# Patient Record
Sex: Female | Born: 1940 | Race: White | Hispanic: No | Smoking: Former smoker
Health system: Southern US, Community
[De-identification: ages and names within clinical notes are randomized; demographics above are authoritative.]

## PROBLEM LIST (undated history)

## (undated) DIAGNOSIS — F32A Depression, unspecified: Secondary | ICD-10-CM

## (undated) DIAGNOSIS — R112 Nausea with vomiting, unspecified: Secondary | ICD-10-CM

## (undated) DIAGNOSIS — I10 Essential (primary) hypertension: Secondary | ICD-10-CM

## (undated) DIAGNOSIS — R51 Headache: Secondary | ICD-10-CM

## (undated) DIAGNOSIS — N183 Chronic kidney disease, stage 3 unspecified: Secondary | ICD-10-CM

## (undated) DIAGNOSIS — Z9889 Other specified postprocedural states: Secondary | ICD-10-CM

## (undated) DIAGNOSIS — F329 Major depressive disorder, single episode, unspecified: Secondary | ICD-10-CM

## (undated) DIAGNOSIS — R519 Headache, unspecified: Secondary | ICD-10-CM

## (undated) DIAGNOSIS — M1990 Unspecified osteoarthritis, unspecified site: Secondary | ICD-10-CM

## (undated) DIAGNOSIS — K219 Gastro-esophageal reflux disease without esophagitis: Secondary | ICD-10-CM

## (undated) DIAGNOSIS — Z923 Personal history of irradiation: Secondary | ICD-10-CM

## (undated) DIAGNOSIS — N950 Postmenopausal bleeding: Secondary | ICD-10-CM

## (undated) DIAGNOSIS — Z789 Other specified health status: Secondary | ICD-10-CM

## (undated) DIAGNOSIS — G709 Myoneural disorder, unspecified: Secondary | ICD-10-CM

## (undated) DIAGNOSIS — Z973 Presence of spectacles and contact lenses: Secondary | ICD-10-CM

## (undated) DIAGNOSIS — R350 Frequency of micturition: Secondary | ICD-10-CM

## (undated) DIAGNOSIS — E039 Hypothyroidism, unspecified: Secondary | ICD-10-CM

## (undated) DIAGNOSIS — E119 Type 2 diabetes mellitus without complications: Secondary | ICD-10-CM

## (undated) DIAGNOSIS — C541 Malignant neoplasm of endometrium: Secondary | ICD-10-CM

## (undated) HISTORY — DX: Personal history of irradiation: Z92.3

## (undated) HISTORY — DX: Malignant neoplasm of endometrium: C54.1

## (undated) HISTORY — PX: OTHER SURGICAL HISTORY: SHX169

## (undated) HISTORY — PX: BREAST SURGERY: SHX581

## (undated) HISTORY — PX: JOINT REPLACEMENT: SHX530

## (undated) HISTORY — PX: TONSILLECTOMY: SUR1361

## (undated) HISTORY — PX: IRRIGATION AND DEBRIDEMENT SEBACEOUS CYST: SHX5255

## (undated) HISTORY — DX: Postmenopausal bleeding: N95.0

## (undated) HISTORY — PX: FRACTURE SURGERY: SHX138

---

## 1997-08-30 ENCOUNTER — Inpatient Hospital Stay (HOSPITAL_COMMUNITY): Admission: RE | Admit: 1997-08-30 | Discharge: 1997-09-04 | Payer: Self-pay | Admitting: Orthopaedic Surgery

## 1997-09-08 ENCOUNTER — Other Ambulatory Visit: Admission: RE | Admit: 1997-09-08 | Discharge: 1997-09-08 | Payer: Self-pay | Admitting: Orthopaedic Surgery

## 2003-07-26 ENCOUNTER — Encounter: Admission: RE | Admit: 2003-07-26 | Discharge: 2003-07-26 | Payer: Self-pay | Admitting: Internal Medicine

## 2013-05-11 DIAGNOSIS — R809 Proteinuria, unspecified: Secondary | ICD-10-CM | POA: Diagnosis not present

## 2013-05-11 DIAGNOSIS — E785 Hyperlipidemia, unspecified: Secondary | ICD-10-CM | POA: Diagnosis not present

## 2013-05-11 DIAGNOSIS — I10 Essential (primary) hypertension: Secondary | ICD-10-CM | POA: Diagnosis not present

## 2013-05-11 DIAGNOSIS — E119 Type 2 diabetes mellitus without complications: Secondary | ICD-10-CM | POA: Diagnosis not present

## 2013-05-11 DIAGNOSIS — R82998 Other abnormal findings in urine: Secondary | ICD-10-CM | POA: Diagnosis not present

## 2013-07-09 DIAGNOSIS — F341 Dysthymic disorder: Secondary | ICD-10-CM | POA: Diagnosis not present

## 2013-07-09 DIAGNOSIS — E079 Disorder of thyroid, unspecified: Secondary | ICD-10-CM | POA: Diagnosis not present

## 2013-07-09 DIAGNOSIS — I10 Essential (primary) hypertension: Secondary | ICD-10-CM | POA: Diagnosis not present

## 2013-07-09 DIAGNOSIS — M545 Low back pain, unspecified: Secondary | ICD-10-CM | POA: Diagnosis not present

## 2013-07-09 DIAGNOSIS — E119 Type 2 diabetes mellitus without complications: Secondary | ICD-10-CM | POA: Diagnosis not present

## 2013-07-09 DIAGNOSIS — IMO0002 Reserved for concepts with insufficient information to code with codable children: Secondary | ICD-10-CM | POA: Diagnosis not present

## 2013-07-09 DIAGNOSIS — N183 Chronic kidney disease, stage 3 unspecified: Secondary | ICD-10-CM | POA: Diagnosis not present

## 2013-07-09 DIAGNOSIS — Z0000 Encounter for general adult medical examination without abnormal findings: Secondary | ICD-10-CM | POA: Diagnosis not present

## 2013-07-09 DIAGNOSIS — Z1331 Encounter for screening for depression: Secondary | ICD-10-CM | POA: Diagnosis not present

## 2013-08-03 DIAGNOSIS — Z6829 Body mass index (BMI) 29.0-29.9, adult: Secondary | ICD-10-CM | POA: Diagnosis not present

## 2013-08-03 DIAGNOSIS — IMO0002 Reserved for concepts with insufficient information to code with codable children: Secondary | ICD-10-CM | POA: Diagnosis not present

## 2013-08-05 ENCOUNTER — Other Ambulatory Visit: Payer: Self-pay | Admitting: Neurosurgery

## 2013-08-05 DIAGNOSIS — M5416 Radiculopathy, lumbar region: Secondary | ICD-10-CM

## 2013-08-17 ENCOUNTER — Ambulatory Visit
Admission: RE | Admit: 2013-08-17 | Discharge: 2013-08-17 | Disposition: A | Discharge status: Home / Self Care | Payer: Medicare Other | Source: Ambulatory Visit | Attending: Neurosurgery | Admitting: Neurosurgery

## 2013-08-17 VITALS — BP 197/86 | HR 79

## 2013-08-17 DIAGNOSIS — M47817 Spondylosis without myelopathy or radiculopathy, lumbosacral region: Secondary | ICD-10-CM | POA: Diagnosis not present

## 2013-08-17 DIAGNOSIS — M4310 Spondylolisthesis, site unspecified: Secondary | ICD-10-CM | POA: Diagnosis not present

## 2013-08-17 DIAGNOSIS — IMO0002 Reserved for concepts with insufficient information to code with codable children: Secondary | ICD-10-CM | POA: Diagnosis not present

## 2013-08-17 DIAGNOSIS — M5416 Radiculopathy, lumbar region: Secondary | ICD-10-CM

## 2013-08-17 DIAGNOSIS — M48061 Spinal stenosis, lumbar region without neurogenic claudication: Secondary | ICD-10-CM | POA: Diagnosis not present

## 2013-08-17 DIAGNOSIS — M5137 Other intervertebral disc degeneration, lumbosacral region: Secondary | ICD-10-CM | POA: Diagnosis not present

## 2013-08-17 MED ORDER — DIAZEPAM 5 MG PO TABS
5.0000 mg | ORAL_TABLET | Freq: Once | ORAL | Status: AC
  Administered 2013-08-17: 5 mg via ORAL

## 2013-08-17 MED ORDER — IOHEXOL 180 MG/ML  SOLN
15.0000 mL | Freq: Once | INTRAMUSCULAR | Status: AC | PRN
  Administered 2013-08-17: 15 mL via INTRATHECAL

## 2013-08-17 NOTE — Discharge Instructions (Signed)

## 2013-08-17 NOTE — Progress Notes (Signed)
Daughter in law called discharge time.

## 2013-09-08 DIAGNOSIS — M48061 Spinal stenosis, lumbar region without neurogenic claudication: Secondary | ICD-10-CM | POA: Diagnosis not present

## 2013-09-08 DIAGNOSIS — IMO0002 Reserved for concepts with insufficient information to code with codable children: Secondary | ICD-10-CM | POA: Diagnosis not present

## 2013-09-08 DIAGNOSIS — Z6830 Body mass index (BMI) 30.0-30.9, adult: Secondary | ICD-10-CM | POA: Diagnosis not present

## 2013-09-08 DIAGNOSIS — M549 Dorsalgia, unspecified: Secondary | ICD-10-CM | POA: Diagnosis not present

## 2013-11-11 DIAGNOSIS — M48061 Spinal stenosis, lumbar region without neurogenic claudication: Secondary | ICD-10-CM | POA: Diagnosis not present

## 2013-11-11 DIAGNOSIS — IMO0002 Reserved for concepts with insufficient information to code with codable children: Secondary | ICD-10-CM | POA: Diagnosis not present

## 2013-11-11 DIAGNOSIS — M549 Dorsalgia, unspecified: Secondary | ICD-10-CM | POA: Diagnosis not present

## 2014-01-17 DIAGNOSIS — N183 Chronic kidney disease, stage 3 unspecified: Secondary | ICD-10-CM | POA: Diagnosis not present

## 2014-01-17 DIAGNOSIS — E079 Disorder of thyroid, unspecified: Secondary | ICD-10-CM | POA: Diagnosis not present

## 2014-01-17 DIAGNOSIS — G8929 Other chronic pain: Secondary | ICD-10-CM | POA: Diagnosis not present

## 2014-01-17 DIAGNOSIS — E1129 Type 2 diabetes mellitus with other diabetic kidney complication: Secondary | ICD-10-CM | POA: Diagnosis not present

## 2014-01-17 DIAGNOSIS — Z23 Encounter for immunization: Secondary | ICD-10-CM | POA: Diagnosis not present

## 2014-01-17 DIAGNOSIS — IMO0002 Reserved for concepts with insufficient information to code with codable children: Secondary | ICD-10-CM | POA: Diagnosis not present

## 2014-01-17 DIAGNOSIS — E785 Hyperlipidemia, unspecified: Secondary | ICD-10-CM | POA: Diagnosis not present

## 2014-01-17 DIAGNOSIS — F341 Dysthymic disorder: Secondary | ICD-10-CM | POA: Diagnosis not present

## 2014-01-17 DIAGNOSIS — H6120 Impacted cerumen, unspecified ear: Secondary | ICD-10-CM | POA: Diagnosis not present

## 2014-02-04 DIAGNOSIS — H902 Conductive hearing loss, unspecified: Secondary | ICD-10-CM | POA: Diagnosis not present

## 2014-02-04 DIAGNOSIS — H6121 Impacted cerumen, right ear: Secondary | ICD-10-CM | POA: Diagnosis not present

## 2014-02-21 DIAGNOSIS — H6122 Impacted cerumen, left ear: Secondary | ICD-10-CM | POA: Diagnosis not present

## 2014-07-18 DIAGNOSIS — E785 Hyperlipidemia, unspecified: Secondary | ICD-10-CM | POA: Diagnosis not present

## 2014-07-18 DIAGNOSIS — I10 Essential (primary) hypertension: Secondary | ICD-10-CM | POA: Diagnosis not present

## 2014-07-18 DIAGNOSIS — Z008 Encounter for other general examination: Secondary | ICD-10-CM | POA: Diagnosis not present

## 2014-07-18 DIAGNOSIS — R8299 Other abnormal findings in urine: Secondary | ICD-10-CM | POA: Diagnosis not present

## 2014-07-18 DIAGNOSIS — E119 Type 2 diabetes mellitus without complications: Secondary | ICD-10-CM | POA: Diagnosis not present

## 2014-07-22 DIAGNOSIS — R946 Abnormal results of thyroid function studies: Secondary | ICD-10-CM | POA: Diagnosis not present

## 2014-07-25 DIAGNOSIS — Z1389 Encounter for screening for other disorder: Secondary | ICD-10-CM | POA: Diagnosis not present

## 2014-07-25 DIAGNOSIS — N183 Chronic kidney disease, stage 3 (moderate): Secondary | ICD-10-CM | POA: Diagnosis not present

## 2014-07-25 DIAGNOSIS — E119 Type 2 diabetes mellitus without complications: Secondary | ICD-10-CM | POA: Diagnosis not present

## 2014-07-25 DIAGNOSIS — Z6834 Body mass index (BMI) 34.0-34.9, adult: Secondary | ICD-10-CM | POA: Diagnosis not present

## 2014-07-25 DIAGNOSIS — R946 Abnormal results of thyroid function studies: Secondary | ICD-10-CM | POA: Diagnosis not present

## 2014-07-25 DIAGNOSIS — E669 Obesity, unspecified: Secondary | ICD-10-CM | POA: Diagnosis not present

## 2014-07-25 DIAGNOSIS — F418 Other specified anxiety disorders: Secondary | ICD-10-CM | POA: Diagnosis not present

## 2014-07-25 DIAGNOSIS — G894 Chronic pain syndrome: Secondary | ICD-10-CM | POA: Diagnosis not present

## 2014-07-25 DIAGNOSIS — Z23 Encounter for immunization: Secondary | ICD-10-CM | POA: Diagnosis not present

## 2014-07-25 DIAGNOSIS — M545 Low back pain: Secondary | ICD-10-CM | POA: Diagnosis not present

## 2014-07-25 DIAGNOSIS — J449 Chronic obstructive pulmonary disease, unspecified: Secondary | ICD-10-CM | POA: Diagnosis not present

## 2014-07-25 DIAGNOSIS — Z0000 Encounter for general adult medical examination without abnormal findings: Secondary | ICD-10-CM | POA: Diagnosis not present

## 2014-09-26 DIAGNOSIS — R739 Hyperglycemia, unspecified: Secondary | ICD-10-CM | POA: Diagnosis not present

## 2014-09-26 DIAGNOSIS — Z96641 Presence of right artificial hip joint: Secondary | ICD-10-CM | POA: Diagnosis not present

## 2014-09-26 DIAGNOSIS — M25551 Pain in right hip: Secondary | ICD-10-CM | POA: Diagnosis not present

## 2014-10-06 DIAGNOSIS — M1611 Unilateral primary osteoarthritis, right hip: Secondary | ICD-10-CM | POA: Diagnosis not present

## 2014-10-06 DIAGNOSIS — M25551 Pain in right hip: Secondary | ICD-10-CM | POA: Diagnosis not present

## 2014-11-01 DIAGNOSIS — M1611 Unilateral primary osteoarthritis, right hip: Secondary | ICD-10-CM | POA: Diagnosis not present

## 2014-11-03 ENCOUNTER — Other Ambulatory Visit (HOSPITAL_COMMUNITY): Payer: Self-pay | Admitting: Orthopaedic Surgery

## 2014-11-03 DIAGNOSIS — M25561 Pain in right knee: Secondary | ICD-10-CM

## 2014-11-10 ENCOUNTER — Ambulatory Visit (HOSPITAL_COMMUNITY)
Admission: RE | Admit: 2014-11-10 | Discharge: 2014-11-10 | Disposition: A | Discharge status: Home / Self Care | Payer: Medicare Other | Source: Ambulatory Visit | Attending: Orthopaedic Surgery | Admitting: Orthopaedic Surgery

## 2014-11-10 ENCOUNTER — Encounter (HOSPITAL_COMMUNITY)
Admission: RE | Admit: 2014-11-10 | Discharge: 2014-11-10 | Disposition: A | Discharge status: Home / Self Care | Payer: Medicare Other | Source: Ambulatory Visit | Attending: Orthopaedic Surgery | Admitting: Orthopaedic Surgery

## 2014-11-10 DIAGNOSIS — Z96651 Presence of right artificial knee joint: Secondary | ICD-10-CM | POA: Diagnosis not present

## 2014-11-10 DIAGNOSIS — M25561 Pain in right knee: Secondary | ICD-10-CM

## 2014-11-10 MED ORDER — TECHNETIUM TC 99M MEDRONATE IV KIT
25.0000 | PACK | Freq: Once | INTRAVENOUS | Status: AC | PRN
  Administered 2014-11-10: 25 via INTRAVENOUS

## 2014-11-22 DIAGNOSIS — M1611 Unilateral primary osteoarthritis, right hip: Secondary | ICD-10-CM | POA: Diagnosis not present

## 2014-11-22 DIAGNOSIS — E119 Type 2 diabetes mellitus without complications: Secondary | ICD-10-CM | POA: Diagnosis not present

## 2014-11-22 DIAGNOSIS — M25561 Pain in right knee: Secondary | ICD-10-CM | POA: Diagnosis not present

## 2014-12-13 DIAGNOSIS — E119 Type 2 diabetes mellitus without complications: Secondary | ICD-10-CM | POA: Diagnosis not present

## 2014-12-13 DIAGNOSIS — Z6834 Body mass index (BMI) 34.0-34.9, adult: Secondary | ICD-10-CM | POA: Diagnosis not present

## 2014-12-13 DIAGNOSIS — M25551 Pain in right hip: Secondary | ICD-10-CM | POA: Diagnosis not present

## 2015-01-23 DIAGNOSIS — M1611 Unilateral primary osteoarthritis, right hip: Secondary | ICD-10-CM | POA: Diagnosis not present

## 2015-01-23 DIAGNOSIS — M25551 Pain in right hip: Secondary | ICD-10-CM | POA: Diagnosis not present

## 2015-01-23 DIAGNOSIS — M25561 Pain in right knee: Secondary | ICD-10-CM | POA: Diagnosis not present

## 2015-01-23 DIAGNOSIS — E119 Type 2 diabetes mellitus without complications: Secondary | ICD-10-CM | POA: Diagnosis not present

## 2015-01-23 LAB — HEMOGLOBIN A1C: Hemoglobin A1C: 7.6

## 2015-01-30 DIAGNOSIS — J449 Chronic obstructive pulmonary disease, unspecified: Secondary | ICD-10-CM | POA: Diagnosis not present

## 2015-01-30 DIAGNOSIS — E669 Obesity, unspecified: Secondary | ICD-10-CM | POA: Diagnosis not present

## 2015-01-30 DIAGNOSIS — Z6834 Body mass index (BMI) 34.0-34.9, adult: Secondary | ICD-10-CM | POA: Diagnosis not present

## 2015-01-30 DIAGNOSIS — F329 Major depressive disorder, single episode, unspecified: Secondary | ICD-10-CM | POA: Diagnosis not present

## 2015-01-30 DIAGNOSIS — I10 Essential (primary) hypertension: Secondary | ICD-10-CM | POA: Diagnosis not present

## 2015-01-30 DIAGNOSIS — G4700 Insomnia, unspecified: Secondary | ICD-10-CM | POA: Diagnosis not present

## 2015-01-30 DIAGNOSIS — Z23 Encounter for immunization: Secondary | ICD-10-CM | POA: Diagnosis not present

## 2015-01-30 DIAGNOSIS — E785 Hyperlipidemia, unspecified: Secondary | ICD-10-CM | POA: Diagnosis not present

## 2015-01-30 DIAGNOSIS — R946 Abnormal results of thyroid function studies: Secondary | ICD-10-CM | POA: Diagnosis not present

## 2015-01-30 DIAGNOSIS — M25551 Pain in right hip: Secondary | ICD-10-CM | POA: Diagnosis not present

## 2015-01-30 DIAGNOSIS — N183 Chronic kidney disease, stage 3 (moderate): Secondary | ICD-10-CM | POA: Diagnosis not present

## 2015-01-30 DIAGNOSIS — E119 Type 2 diabetes mellitus without complications: Secondary | ICD-10-CM | POA: Diagnosis not present

## 2015-02-07 ENCOUNTER — Other Ambulatory Visit (HOSPITAL_COMMUNITY): Payer: Self-pay | Admitting: Orthopaedic Surgery

## 2015-02-11 NOTE — Pre-Procedure Instructions (Signed)
Cassandra Dodson  02/11/2015     Your procedure is scheduled on November 3.  Report to [facility] Admitting at 10:15 A.M.  Call this number if you have problems the morning of surgery:  [phone]   Remember:  Do not eat food or drink liquids after midnight.  Take these medicines the morning of surgery with A SIP OF WATER Hydrocodone (if needed), Levothyroxine,    STOP Vitamin C, Multiple Vitamins, Calcium, Aspirin October 27   STOP/ Do not take Aspirin, Aleve, Naproxen, Advil, Ibuprofen, Motrin, Vitamins, Herbs, or Supplements starting October 27  How to Manage Your Diabetes Before Surgery   Why is it important to control my blood sugar before and after surgery?   Improving blood sugar levels before and after surgery helps healing and can limit problems.  A way of improving blood sugar control is eating a healthy diet by:  - Eating less sugar and carbohydrates  - Increasing activity/exercise  - Talk with your doctor about reaching your blood sugar goals  High blood sugars (greater than 180 mg/dL) can raise your risk of infections and slow down your recovery so you will need to focus on controlling your diabetes during the weeks before surgery.  Make sure that the doctor who takes care of your diabetes knows about your planned surgery including the date and location.  How do I manage my blood sugars before surgery?   Check your blood sugar at least 4 times a day, 2 days before surgery to make sure that they are not too high or low.   Check your blood sugar the morning of your surgery when you wake up and every 2 hours until you get to the Short-Stay unit.  If your blood sugar is less than 70 mg/dL, you will need to treat for low blood sugar by:  Treat a low blood sugar (less than 70 mg/dL) with 1/2 cup of clear juice (cranberry or apple), 4 glucose tablets, OR glucose gel.  Recheck blood sugar in 15 minutes after treatment (to make sure it is greater than 70  mg/dL).  If blood sugar is not greater than 70 mg/dL on re-check, call [phone] for further instructions.   Report your blood sugar to the Short-Stay nurse when you get to Short-Stay.  References:  [facility], 2007 "How to Manage your Diabetes Before and After Surgery".  What do I do about my diabetes medications?   Do not take oral diabetes medicines (pills) the morning of surgery.   Do not wear jewelry, make-up or nail polish.  Do not wear lotions, powders, or perfumes.  You may wear deodorant.  Do not shave 48 hours prior to surgery.  Men may shave face and neck.  Do not bring valuables to the hospital.  [facility] is not responsible for any belongings or valuables.  Contacts, dentures or bridgework may not be worn into surgery.  Leave your suitcase in the car.  After surgery it may be brought to your room.  For patients admitted to the hospital, discharge time will be determined by your treatment team.  Patients discharged the day of surgery will not be allowed to drive home.   [place] - Preparing for Surgery  Before surgery, you can play an important role.  Because skin is not sterile, your skin needs to be as free of germs as possible.  You can reduce the number of germs on you skin by washing with CHG (  chlorahexidine gluconate) soap before surgery.  CHG is an antiseptic cleaner which kills germs and bonds with the skin to continue killing germs even after washing.  Please DO NOT use if you have an allergy to CHG or antibacterial soaps.  If your skin becomes reddened/irritated stop using the CHG and inform your nurse when you arrive at Short Stay.  Do not shave (including legs and underarms) for at least 48 hours prior to the first CHG shower.  You may shave your face.  Please follow these instructions carefully:   1.  Shower with CHG Soap the night before surgery and the morning of Surgery.  2.  If you choose to wash your hair, wash your  hair first as usual with your normal shampoo.  3.  After you shampoo, rinse your hair and body thoroughly to remove the shampoo.  4.  Use CHG as you would any other liquid soap.  You can apply CHG directly to the skin and wash gently with scrungie or a clean washcloth.  5.  Apply the CHG Soap to your body ONLY FROM THE NECK DOWN.  Do not use on open wounds or open sores.  Avoid contact with your eyes, ears, mouth and genitals (private parts).  Wash genitals (private parts) with your normal soap.  6.  Wash thoroughly, paying special attention to the area where your surgery will be performed.  7.  Thoroughly rinse your body with warm water from the neck down.  8.  DO NOT shower/wash with your normal soap after using and rinsing off the CHG Soap.  9.  Pat yourself dry with a clean towel.            10.  Wear clean pajamas.            11.  Place clean sheets on your bed the night of your first shower and do not sleep with pets.  Day of Surgery  Do not apply any lotions the morning of surgery.  Please wear clean clothes to the hospital/surgery center.   Please read over the following fact sheets that you were given. Pain Booklet, Coughing and Deep Breathing, Blood Transfusion Information, Total Joint Packet and Surgical Site Infection Prevention

## 2015-02-13 ENCOUNTER — Encounter (HOSPITAL_COMMUNITY): Payer: Self-pay

## 2015-02-13 ENCOUNTER — Other Ambulatory Visit: Payer: Self-pay

## 2015-02-13 ENCOUNTER — Encounter (HOSPITAL_COMMUNITY)
Admission: RE | Admit: 2015-02-13 | Discharge: 2015-02-13 | Disposition: A | Discharge status: Home / Self Care | Payer: Medicare Other | Source: Ambulatory Visit | Attending: Orthopaedic Surgery | Admitting: Orthopaedic Surgery

## 2015-02-13 DIAGNOSIS — Z0183 Encounter for blood typing: Secondary | ICD-10-CM | POA: Insufficient documentation

## 2015-02-13 DIAGNOSIS — Z7982 Long term (current) use of aspirin: Secondary | ICD-10-CM | POA: Diagnosis not present

## 2015-02-13 DIAGNOSIS — N183 Chronic kidney disease, stage 3 (moderate): Secondary | ICD-10-CM | POA: Insufficient documentation

## 2015-02-13 DIAGNOSIS — Z01818 Encounter for other preprocedural examination: Secondary | ICD-10-CM | POA: Insufficient documentation

## 2015-02-13 DIAGNOSIS — M1611 Unilateral primary osteoarthritis, right hip: Secondary | ICD-10-CM | POA: Insufficient documentation

## 2015-02-13 DIAGNOSIS — Z01812 Encounter for preprocedural laboratory examination: Secondary | ICD-10-CM | POA: Diagnosis not present

## 2015-02-13 DIAGNOSIS — Z87891 Personal history of nicotine dependence: Secondary | ICD-10-CM | POA: Diagnosis not present

## 2015-02-13 DIAGNOSIS — E039 Hypothyroidism, unspecified: Secondary | ICD-10-CM | POA: Diagnosis not present

## 2015-02-13 DIAGNOSIS — K219 Gastro-esophageal reflux disease without esophagitis: Secondary | ICD-10-CM | POA: Insufficient documentation

## 2015-02-13 DIAGNOSIS — Z7984 Long term (current) use of oral hypoglycemic drugs: Secondary | ICD-10-CM | POA: Diagnosis not present

## 2015-02-13 DIAGNOSIS — I129 Hypertensive chronic kidney disease with stage 1 through stage 4 chronic kidney disease, or unspecified chronic kidney disease: Secondary | ICD-10-CM | POA: Insufficient documentation

## 2015-02-13 DIAGNOSIS — Z79899 Other long term (current) drug therapy: Secondary | ICD-10-CM | POA: Diagnosis not present

## 2015-02-13 DIAGNOSIS — E1122 Type 2 diabetes mellitus with diabetic chronic kidney disease: Secondary | ICD-10-CM | POA: Insufficient documentation

## 2015-02-13 HISTORY — DX: Frequency of micturition: R35.0

## 2015-02-13 HISTORY — DX: Depression, unspecified: F32.A

## 2015-02-13 HISTORY — DX: Chronic kidney disease, stage 3 unspecified: N18.30

## 2015-02-13 HISTORY — DX: Unspecified osteoarthritis, unspecified site: M19.90

## 2015-02-13 HISTORY — DX: Nausea with vomiting, unspecified: R11.2

## 2015-02-13 HISTORY — DX: Hypothyroidism, unspecified: E03.9

## 2015-02-13 HISTORY — DX: Type 2 diabetes mellitus without complications: E11.9

## 2015-02-13 HISTORY — DX: Chronic kidney disease, stage 3 (moderate): N18.3

## 2015-02-13 HISTORY — DX: Essential (primary) hypertension: I10

## 2015-02-13 HISTORY — DX: Other specified postprocedural states: Z98.890

## 2015-02-13 HISTORY — DX: Myoneural disorder, unspecified: G70.9

## 2015-02-13 HISTORY — DX: Major depressive disorder, single episode, unspecified: F32.9

## 2015-02-13 HISTORY — DX: Gastro-esophageal reflux disease without esophagitis: K21.9

## 2015-02-13 LAB — CBC WITH DIFFERENTIAL/PLATELET
BASOS ABS: 0.1 10*3/uL (ref 0.0–0.1)
Basophils Relative: 1 %
Eosinophils Absolute: 0.2 10*3/uL (ref 0.0–0.7)
Eosinophils Relative: 2 %
HEMATOCRIT: 42 % (ref 36.0–46.0)
Hemoglobin: 13.6 g/dL (ref 12.0–15.0)
LYMPHS ABS: 3 10*3/uL (ref 0.7–4.0)
LYMPHS PCT: 32 %
MCH: 29.4 pg (ref 26.0–34.0)
MCHC: 32.4 g/dL (ref 30.0–36.0)
MCV: 90.9 fL (ref 78.0–100.0)
MONOS PCT: 6 %
Monocytes Absolute: 0.6 10*3/uL (ref 0.1–1.0)
NEUTROS ABS: 5.4 10*3/uL (ref 1.7–7.7)
Neutrophils Relative %: 59 %
Platelets: 250 10*3/uL (ref 150–400)
RBC: 4.62 MIL/uL (ref 3.87–5.11)
RDW: 14.3 % (ref 11.5–15.5)
WBC: 9.3 10*3/uL (ref 4.0–10.5)

## 2015-02-13 LAB — COMPREHENSIVE METABOLIC PANEL
ALT: 14 U/L (ref 14–54)
AST: 23 U/L (ref 15–41)
Albumin: 4 g/dL (ref 3.5–5.0)
Alkaline Phosphatase: 69 U/L (ref 38–126)
Anion gap: 15 (ref 5–15)
BILIRUBIN TOTAL: 0.4 mg/dL (ref 0.3–1.2)
BUN: 30 mg/dL — AB (ref 6–20)
CO2: 22 mmol/L (ref 22–32)
CREATININE: 1.76 mg/dL — AB (ref 0.44–1.00)
Calcium: 10.2 mg/dL (ref 8.9–10.3)
Chloride: 101 mmol/L (ref 101–111)
GFR calc Af Amer: 31 mL/min — ABNORMAL LOW (ref >60)
GFR, EST NON AFRICAN AMERICAN: 27 mL/min — AB (ref >60)
Glucose, Bld: 226 mg/dL — ABNORMAL HIGH (ref 65–99)
POTASSIUM: 3.7 mmol/L (ref 3.5–5.1)
Sodium: 138 mmol/L (ref 135–145)
TOTAL PROTEIN: 7.2 g/dL (ref 6.5–8.1)

## 2015-02-13 LAB — APTT: aPTT: 26 seconds (ref 24–37)

## 2015-02-13 LAB — URINE MICROSCOPIC-ADD ON

## 2015-02-13 LAB — URINALYSIS, ROUTINE W REFLEX MICROSCOPIC
Bilirubin Urine: NEGATIVE
Glucose, UA: NEGATIVE mg/dL
Hgb urine dipstick: NEGATIVE
Ketones, ur: 15 mg/dL — AB
NITRITE: NEGATIVE
Protein, ur: NEGATIVE mg/dL
SPECIFIC GRAVITY, URINE: 1.025 (ref 1.005–1.030)
UROBILINOGEN UA: 0.2 mg/dL (ref 0.0–1.0)
pH: 5 (ref 5.0–8.0)

## 2015-02-13 LAB — GLUCOSE, CAPILLARY: GLUCOSE-CAPILLARY: 230 mg/dL — AB (ref 65–99)

## 2015-02-13 LAB — ABO/RH: ABO/RH(D): O POS

## 2015-02-13 LAB — PROTIME-INR
INR: 1.12 (ref 0.00–1.49)
PROTHROMBIN TIME: 14.6 s (ref 11.6–15.2)

## 2015-02-13 LAB — TYPE AND SCREEN
ABO/RH(D): O POS
ANTIBODY SCREEN: NEGATIVE

## 2015-02-13 LAB — C-REACTIVE PROTEIN: CRP: 1.1 mg/dL — ABNORMAL HIGH (ref <1.0)

## 2015-02-13 LAB — SURGICAL PCR SCREEN
MRSA, PCR: NEGATIVE
Staphylococcus aureus: NEGATIVE

## 2015-02-13 LAB — SEDIMENTATION RATE: Sed Rate: 13 mm/hr (ref 0–22)

## 2015-02-13 NOTE — Progress Notes (Signed)
Requested EKG  OV  From Dr. Shon Baton.

## 2015-02-13 NOTE — Final Progress Note (Signed)
Pt. States that she does not check her blood sugars @ home.  Son with pt. States that he will make sure that pt. Gets a CBG meter and starts checking her blood sugars.  Pt. Had a A1C  Done in October that was 7.5 results are in chart.

## 2015-02-14 ENCOUNTER — Encounter (HOSPITAL_COMMUNITY): Payer: Self-pay

## 2015-02-14 NOTE — Progress Notes (Addendum)
Anesthesia Chart Review:  Pt is 74 year old female scheduled for R total hip arthroplasty anterior approach on 02/23/2015 with Dr. Erlinda Hong.   PCP is Dr. Shon Baton.   PMH includes: HTN, DM, CKD (stage III), hypothyroidism, GERD, post-op N/V. Former smoker.   Medications include: ASA, fenofibrate, levothyroxine, metformin, saxagliptin, telmisartan-hctz.   Preoperative labs reviewed.  Glucose 226; hgbA1c 7.5. Cr 1.76, BUN 30. Prior labs from PCP's office 07/18/14 show Cr 1.2, BUN 33.   EKG 02/13/2015: sinus tachycardia (101 bpm).   Pt has medical clearance from Dr. Virgina Jock for THA. I sent a copy of pt's BMET results to Dr. Virgina Jock, verifying pt can proceed with cr of 1.76. He responded that he will recheck in his office later this week, and let us know if pt is ok to proceed.   Will revisit chart when more information available.   Willeen Cass, FNP-BC [facility]/Anesthesiology Phone: [phone] 02/15/2015 4:30 PM  Addendum: Dr. Virgina Jock reviewed patient's PAT labs. He ordered a repeat BMET for 02/17/15 (done at 11:04 AM, so likely not fasting). If still elevated he was planning to decrease her HCTZ and follow-up again on 02/20/15, but follow-up BMET showed BUN 27, Cr 1.3 (which is the same as her labs at his office on 01/30/15 with subsequent clearance). Glucose was 252. (By his notes, A1C 7.6 on 12/13/14, and Onglyza was initiated.)  She will get a fasting CBG on arrival.  George Hugh [facility] Short Stay Center/Anesthesiology Phone [phone] 02/21/2015 3:46 PM

## 2015-02-22 MED ORDER — CHLORHEXIDINE GLUCONATE 4 % EX LIQD: 60.0000 mL | Freq: Once | CUTANEOUS | Status: DC

## 2015-02-22 MED ORDER — SODIUM CHLORIDE 0.9 % IV SOLN
1000.0000 mg | INTRAVENOUS | Status: AC
  Administered 2015-02-23: 1000 mg via INTRAVENOUS
  Filled 2015-02-22: qty 10

## 2015-02-22 MED ORDER — CEFAZOLIN SODIUM-DEXTROSE 2-3 GM-% IV SOLR
2.0000 g | INTRAVENOUS | Status: AC
  Administered 2015-02-23: 2 g via INTRAVENOUS
  Filled 2015-02-22: qty 50

## 2015-02-22 MED ORDER — BUPIVACAINE LIPOSOME 1.3 % IJ SUSP
20.0000 mL | INTRAMUSCULAR | Status: DC
  Filled 2015-02-22: qty 20

## 2015-02-23 ENCOUNTER — Inpatient Hospital Stay (HOSPITAL_COMMUNITY): Payer: Medicare Other

## 2015-02-23 ENCOUNTER — Inpatient Hospital Stay (HOSPITAL_COMMUNITY)
Admission: RE | Admit: 2015-02-23 | Discharge: 2015-02-27 | DRG: 469 | Disposition: A | Discharge status: Skilled Nursing Facility | Payer: Medicare Other | Source: Ambulatory Visit | Attending: Orthopaedic Surgery | Admitting: Orthopaedic Surgery

## 2015-02-23 ENCOUNTER — Inpatient Hospital Stay (HOSPITAL_COMMUNITY): Payer: Medicare Other | Admitting: Emergency Medicine

## 2015-02-23 ENCOUNTER — Inpatient Hospital Stay (HOSPITAL_COMMUNITY): Payer: Medicare Other | Admitting: Anesthesiology

## 2015-02-23 ENCOUNTER — Encounter (HOSPITAL_COMMUNITY): Payer: Self-pay

## 2015-02-23 ENCOUNTER — Encounter (HOSPITAL_COMMUNITY)
Admission: RE | Disposition: A | Discharge status: Skilled Nursing Facility | Payer: Self-pay | Source: Ambulatory Visit | Attending: Orthopaedic Surgery

## 2015-02-23 DIAGNOSIS — E1122 Type 2 diabetes mellitus with diabetic chronic kidney disease: Secondary | ICD-10-CM | POA: Diagnosis present

## 2015-02-23 DIAGNOSIS — F329 Major depressive disorder, single episode, unspecified: Secondary | ICD-10-CM | POA: Diagnosis present

## 2015-02-23 DIAGNOSIS — K210 Gastro-esophageal reflux disease with esophagitis: Secondary | ICD-10-CM | POA: Diagnosis not present

## 2015-02-23 DIAGNOSIS — D62 Acute posthemorrhagic anemia: Secondary | ICD-10-CM | POA: Diagnosis not present

## 2015-02-23 DIAGNOSIS — Z79891 Long term (current) use of opiate analgesic: Secondary | ICD-10-CM

## 2015-02-23 DIAGNOSIS — N183 Chronic kidney disease, stage 3 (moderate): Secondary | ICD-10-CM | POA: Diagnosis present

## 2015-02-23 DIAGNOSIS — Z888 Allergy status to other drugs, medicaments and biological substances status: Secondary | ICD-10-CM | POA: Diagnosis not present

## 2015-02-23 DIAGNOSIS — M25551 Pain in right hip: Secondary | ICD-10-CM | POA: Diagnosis not present

## 2015-02-23 DIAGNOSIS — J690 Pneumonitis due to inhalation of food and vomit: Secondary | ICD-10-CM | POA: Diagnosis not present

## 2015-02-23 DIAGNOSIS — Z7982 Long term (current) use of aspirin: Secondary | ICD-10-CM | POA: Diagnosis not present

## 2015-02-23 DIAGNOSIS — I129 Hypertensive chronic kidney disease with stage 1 through stage 4 chronic kidney disease, or unspecified chronic kidney disease: Secondary | ICD-10-CM | POA: Diagnosis present

## 2015-02-23 DIAGNOSIS — N189 Chronic kidney disease, unspecified: Secondary | ICD-10-CM | POA: Diagnosis not present

## 2015-02-23 DIAGNOSIS — M6281 Muscle weakness (generalized): Secondary | ICD-10-CM | POA: Diagnosis not present

## 2015-02-23 DIAGNOSIS — R2681 Unsteadiness on feet: Secondary | ICD-10-CM | POA: Diagnosis not present

## 2015-02-23 DIAGNOSIS — Z96651 Presence of right artificial knee joint: Secondary | ICD-10-CM | POA: Diagnosis present

## 2015-02-23 DIAGNOSIS — Z96641 Presence of right artificial hip joint: Secondary | ICD-10-CM | POA: Diagnosis not present

## 2015-02-23 DIAGNOSIS — Z471 Aftercare following joint replacement surgery: Secondary | ICD-10-CM | POA: Diagnosis not present

## 2015-02-23 DIAGNOSIS — R1110 Vomiting, unspecified: Secondary | ICD-10-CM | POA: Diagnosis not present

## 2015-02-23 DIAGNOSIS — K219 Gastro-esophageal reflux disease without esophagitis: Secondary | ICD-10-CM | POA: Diagnosis present

## 2015-02-23 DIAGNOSIS — M1610 Unilateral primary osteoarthritis, unspecified hip: Secondary | ICD-10-CM | POA: Diagnosis present

## 2015-02-23 DIAGNOSIS — Z7984 Long term (current) use of oral hypoglycemic drugs: Secondary | ICD-10-CM

## 2015-02-23 DIAGNOSIS — E119 Type 2 diabetes mellitus without complications: Secondary | ICD-10-CM | POA: Diagnosis not present

## 2015-02-23 DIAGNOSIS — M1990 Unspecified osteoarthritis, unspecified site: Secondary | ICD-10-CM

## 2015-02-23 DIAGNOSIS — E039 Hypothyroidism, unspecified: Secondary | ICD-10-CM | POA: Diagnosis present

## 2015-02-23 DIAGNOSIS — M1611 Unilateral primary osteoarthritis, right hip: Principal | ICD-10-CM

## 2015-02-23 DIAGNOSIS — M169 Osteoarthritis of hip, unspecified: Secondary | ICD-10-CM | POA: Diagnosis not present

## 2015-02-23 DIAGNOSIS — Z79899 Other long term (current) drug therapy: Secondary | ICD-10-CM | POA: Diagnosis not present

## 2015-02-23 DIAGNOSIS — Z87891 Personal history of nicotine dependence: Secondary | ICD-10-CM | POA: Diagnosis not present

## 2015-02-23 DIAGNOSIS — T17908A Unspecified foreign body in respiratory tract, part unspecified causing other injury, initial encounter: Secondary | ICD-10-CM

## 2015-02-23 DIAGNOSIS — I10 Essential (primary) hypertension: Secondary | ICD-10-CM | POA: Diagnosis not present

## 2015-02-23 HISTORY — PX: TOTAL HIP ARTHROPLASTY: SHX124

## 2015-02-23 LAB — GLUCOSE, CAPILLARY
GLUCOSE-CAPILLARY: 206 mg/dL — AB (ref 65–99)
GLUCOSE-CAPILLARY: 154 mg/dL — AB (ref 65–99)
GLUCOSE-CAPILLARY: 229 mg/dL — AB (ref 65–99)
Glucose-Capillary: 184 mg/dL — ABNORMAL HIGH (ref 65–99)

## 2015-02-23 SURGERY — ARTHROPLASTY, HIP, TOTAL, ANTERIOR APPROACH
Anesthesia: Spinal | Site: Hip | Laterality: Right

## 2015-02-23 MED ORDER — PHENOL 1.4 % MT LIQD: 1.0000 | OROMUCOSAL | Status: DC | PRN

## 2015-02-23 MED ORDER — LEVOTHYROXINE SODIUM 50 MCG PO TABS
50.0000 ug | ORAL_TABLET | Freq: Every evening | ORAL | Status: DC
  Administered 2015-02-23 – 2015-02-26 (×4): 50 ug via ORAL
  Filled 2015-02-23 (×4): qty 1

## 2015-02-23 MED ORDER — LIDOCAINE HCL (CARDIAC) 20 MG/ML IV SOLN
INTRAVENOUS | Status: AC
  Filled 2015-02-23: qty 5

## 2015-02-23 MED ORDER — POVIDONE-IODINE 10 % EX SOLN
CUTANEOUS | Status: DC | PRN
  Administered 2015-02-23: 1 via TOPICAL

## 2015-02-23 MED ORDER — MORPHINE SULFATE (PF) 2 MG/ML IV SOLN
2.0000 mg | INTRAVENOUS | Status: DC | PRN
  Administered 2015-02-24 (×4): 2 mg via INTRAVENOUS
  Filled 2015-02-23 (×4): qty 1

## 2015-02-23 MED ORDER — PROPOFOL 500 MG/50ML IV EMUL
INTRAVENOUS | Status: DC | PRN
  Administered 2015-02-23: 50 ug/kg/min via INTRAVENOUS

## 2015-02-23 MED ORDER — MAGNESIUM CITRATE PO SOLN: 1.0000 | Freq: Once | ORAL | Status: DC | PRN

## 2015-02-23 MED ORDER — ADULT MULTIVITAMIN W/MINERALS CH
1.0000 | ORAL_TABLET | Freq: Every day | ORAL | Status: DC
  Administered 2015-02-23 – 2015-02-27 (×5): 1 via ORAL
  Filled 2015-02-23 (×5): qty 1

## 2015-02-23 MED ORDER — METHOCARBAMOL 500 MG PO TABS
500.0000 mg | ORAL_TABLET | Freq: Four times a day (QID) | ORAL | Status: DC | PRN
  Administered 2015-02-24 – 2015-02-27 (×5): 500 mg via ORAL
  Filled 2015-02-23 (×6): qty 1

## 2015-02-23 MED ORDER — HYDROCHLOROTHIAZIDE 25 MG PO TABS
25.0000 mg | ORAL_TABLET | Freq: Every day | ORAL | Status: DC
  Administered 2015-02-23 – 2015-02-27 (×5): 25 mg via ORAL
  Filled 2015-02-23 (×5): qty 1

## 2015-02-23 MED ORDER — MEPERIDINE HCL 25 MG/ML IJ SOLN
12.5000 mg | Freq: Once | INTRAMUSCULAR | Status: DC
  Administered 2015-02-23: 25 mg via INTRAVENOUS

## 2015-02-23 MED ORDER — ASPIRIN EC 325 MG PO TBEC: 325.0000 mg | DELAYED_RELEASE_TABLET | Freq: Two times a day (BID) | ORAL | Status: DC

## 2015-02-23 MED ORDER — SODIUM CHLORIDE 0.9 % IV SOLN
INTRAVENOUS | Status: DC
  Administered 2015-02-23 – 2015-02-27 (×3): via INTRAVENOUS

## 2015-02-23 MED ORDER — LACTATED RINGERS IV SOLN
INTRAVENOUS | Status: DC
  Administered 2015-02-23 (×2): via INTRAVENOUS

## 2015-02-23 MED ORDER — LINAGLIPTIN 5 MG PO TABS
5.0000 mg | ORAL_TABLET | Freq: Every day | ORAL | Status: DC
  Administered 2015-02-23 – 2015-02-27 (×5): 5 mg via ORAL
  Filled 2015-02-23 (×5): qty 1

## 2015-02-23 MED ORDER — ALBUMIN HUMAN 5 % IV SOLN
INTRAVENOUS | Status: DC | PRN
  Administered 2015-02-23: 15:00:00 via INTRAVENOUS

## 2015-02-23 MED ORDER — PHENYLEPHRINE HCL 10 MG/ML IJ SOLN
10.0000 mg | INTRAVENOUS | Status: DC | PRN
  Administered 2015-02-23: 20 ug/min via INTRAVENOUS

## 2015-02-23 MED ORDER — VITAMIN C 500 MG PO TABS
500.0000 mg | ORAL_TABLET | Freq: Every day | ORAL | Status: DC
  Administered 2015-02-23 – 2015-02-27 (×5): 500 mg via ORAL
  Filled 2015-02-23 (×5): qty 1

## 2015-02-23 MED ORDER — OXYCODONE HCL 5 MG PO TABS
5.0000 mg | ORAL_TABLET | ORAL | Status: DC | PRN
  Administered 2015-02-23 – 2015-02-27 (×8): 10 mg via ORAL
  Filled 2015-02-23 (×7): qty 2

## 2015-02-23 MED ORDER — TELMISARTAN-HCTZ 80-25 MG PO TABS: 1.0000 | ORAL_TABLET | Freq: Every day | ORAL | Status: DC

## 2015-02-23 MED ORDER — CEFAZOLIN SODIUM-DEXTROSE 2-3 GM-% IV SOLR
2.0000 g | Freq: Four times a day (QID) | INTRAVENOUS | Status: AC
  Administered 2015-02-23 – 2015-02-24 (×2): 2 g via INTRAVENOUS
  Filled 2015-02-23 (×2): qty 50

## 2015-02-23 MED ORDER — MENTHOL 3 MG MT LOZG
LOZENGE | OROMUCOSAL | Status: AC
  Filled 2015-02-23: qty 9

## 2015-02-23 MED ORDER — ACETAMINOPHEN 325 MG PO TABS
ORAL_TABLET | ORAL | Status: AC
  Administered 2015-02-23: 650 mg
  Filled 2015-02-23: qty 2

## 2015-02-23 MED ORDER — OXYCODONE HCL 5 MG PO TABS: 5.0000 mg | ORAL_TABLET | ORAL | Status: DC | PRN

## 2015-02-23 MED ORDER — METOCLOPRAMIDE HCL 10 MG PO TABS: 5.0000 mg | ORAL_TABLET | Freq: Three times a day (TID) | ORAL | Status: DC | PRN

## 2015-02-23 MED ORDER — OXYCODONE HCL ER 10 MG PO T12A: 10.0000 mg | EXTENDED_RELEASE_TABLET | Freq: Two times a day (BID) | ORAL | Status: DC

## 2015-02-23 MED ORDER — BUPIVACAINE IN DEXTROSE 0.75-8.25 % IT SOLN
INTRATHECAL | Status: DC | PRN
  Administered 2015-02-23: 2 mL via INTRATHECAL

## 2015-02-23 MED ORDER — ASPIRIN EC 325 MG PO TBEC
325.0000 mg | DELAYED_RELEASE_TABLET | Freq: Two times a day (BID) | ORAL | Status: DC
  Administered 2015-02-24 – 2015-02-27 (×7): 325 mg via ORAL
  Filled 2015-02-23 (×7): qty 1

## 2015-02-23 MED ORDER — ALUM & MAG HYDROXIDE-SIMETH 200-200-20 MG/5ML PO SUSP
30.0000 mL | ORAL | Status: DC | PRN
  Administered 2015-02-27: 30 mL via ORAL
  Filled 2015-02-23: qty 30

## 2015-02-23 MED ORDER — MIDAZOLAM HCL 2 MG/2ML IJ SOLN
INTRAMUSCULAR | Status: AC
  Filled 2015-02-23: qty 4

## 2015-02-23 MED ORDER — OXYCODONE HCL 5 MG PO TABS: 5.0000 mg | ORAL_TABLET | Freq: Once | ORAL | Status: DC | PRN

## 2015-02-23 MED ORDER — EPHEDRINE SULFATE 50 MG/ML IJ SOLN
INTRAMUSCULAR | Status: DC | PRN
  Administered 2015-02-23: 10 mg via INTRAVENOUS

## 2015-02-23 MED ORDER — METFORMIN HCL 500 MG PO TABS: 1000.0000 mg | ORAL_TABLET | Freq: Two times a day (BID) | ORAL | Status: DC

## 2015-02-23 MED ORDER — INSULIN ASPART 100 UNIT/ML ~~LOC~~ SOLN
0.0000 [IU] | Freq: Three times a day (TID) | SUBCUTANEOUS | Status: DC
  Administered 2015-02-24: 4 [IU] via SUBCUTANEOUS
  Administered 2015-02-24 (×2): 7 [IU] via SUBCUTANEOUS
  Administered 2015-02-25: 11 [IU] via SUBCUTANEOUS
  Administered 2015-02-25 – 2015-02-26 (×5): 7 [IU] via SUBCUTANEOUS
  Administered 2015-02-27: 11 [IU] via SUBCUTANEOUS
  Administered 2015-02-27: 4 [IU] via SUBCUTANEOUS

## 2015-02-23 MED ORDER — ONDANSETRON HCL 4 MG/2ML IJ SOLN
4.0000 mg | Freq: Four times a day (QID) | INTRAMUSCULAR | Status: DC | PRN
  Administered 2015-02-25: 4 mg via INTRAVENOUS
  Filled 2015-02-23: qty 2

## 2015-02-23 MED ORDER — DEXTROSE 5 % IV SOLN: 500.0000 mg | Freq: Four times a day (QID) | INTRAVENOUS | Status: DC | PRN

## 2015-02-23 MED ORDER — OXYCODONE HCL ER 10 MG PO T12A
10.0000 mg | EXTENDED_RELEASE_TABLET | Freq: Two times a day (BID) | ORAL | Status: DC
  Administered 2015-02-23 – 2015-02-27 (×8): 10 mg via ORAL
  Filled 2015-02-23 (×9): qty 1

## 2015-02-23 MED ORDER — 0.9 % SODIUM CHLORIDE (POUR BTL) OPTIME
TOPICAL | Status: DC | PRN
  Administered 2015-02-23: 1000 mL

## 2015-02-23 MED ORDER — SENNOSIDES-DOCUSATE SODIUM 8.6-50 MG PO TABS: 1.0000 | ORAL_TABLET | Freq: Every evening | ORAL | Status: DC | PRN

## 2015-02-23 MED ORDER — METOCLOPRAMIDE HCL 5 MG/ML IJ SOLN: 5.0000 mg | Freq: Three times a day (TID) | INTRAMUSCULAR | Status: DC | PRN

## 2015-02-23 MED ORDER — OXYCODONE HCL 5 MG/5ML PO SOLN: 5.0000 mg | Freq: Once | ORAL | Status: DC | PRN

## 2015-02-23 MED ORDER — ACETAMINOPHEN 650 MG RE SUPP: 650.0000 mg | Freq: Four times a day (QID) | RECTAL | Status: DC | PRN

## 2015-02-23 MED ORDER — ACETAMINOPHEN 325 MG PO TABS: 650.0000 mg | ORAL_TABLET | Freq: Four times a day (QID) | ORAL | Status: DC | PRN

## 2015-02-23 MED ORDER — PROPOFOL 10 MG/ML IV BOLUS
INTRAVENOUS | Status: DC | PRN
  Administered 2015-02-23: 30 mg via INTRAVENOUS
  Administered 2015-02-23 (×2): 50 mg via INTRAVENOUS

## 2015-02-23 MED ORDER — OXYCODONE HCL 5 MG PO TABS
ORAL_TABLET | ORAL | Status: AC
  Administered 2015-02-23: 10 mg via ORAL
  Filled 2015-02-23: qty 2

## 2015-02-23 MED ORDER — PROMETHAZINE HCL 25 MG/ML IJ SOLN: 6.2500 mg | INTRAMUSCULAR | Status: DC | PRN

## 2015-02-23 MED ORDER — POLYETHYLENE GLYCOL 3350 17 G PO PACK
17.0000 g | PACK | Freq: Every day | ORAL | Status: DC | PRN
  Administered 2015-02-27: 17 g via ORAL
  Filled 2015-02-23: qty 1

## 2015-02-23 MED ORDER — ACETAMINOPHEN 500 MG PO TABS
1000.0000 mg | ORAL_TABLET | Freq: Four times a day (QID) | ORAL | Status: AC
  Administered 2015-02-23 – 2015-02-24 (×3): 1000 mg via ORAL
  Filled 2015-02-23 (×4): qty 2

## 2015-02-23 MED ORDER — MIDAZOLAM HCL 5 MG/5ML IJ SOLN
INTRAMUSCULAR | Status: DC | PRN
  Administered 2015-02-23: 1 mg via INTRAVENOUS

## 2015-02-23 MED ORDER — PHENYLEPHRINE 40 MCG/ML (10ML) SYRINGE FOR IV PUSH (FOR BLOOD PRESSURE SUPPORT)
PREFILLED_SYRINGE | INTRAVENOUS | Status: AC
  Filled 2015-02-23: qty 10

## 2015-02-23 MED ORDER — DIPHENHYDRAMINE HCL 12.5 MG/5ML PO LIQD
25.0000 mg | ORAL | Status: DC | PRN
  Filled 2015-02-23: qty 10

## 2015-02-23 MED ORDER — PROPOFOL 10 MG/ML IV BOLUS
INTRAVENOUS | Status: AC
  Filled 2015-02-23: qty 20

## 2015-02-23 MED ORDER — SORBITOL 70 % SOLN
30.0000 mL | Freq: Every day | Status: DC | PRN
  Administered 2015-02-27: 30 mL via ORAL
  Filled 2015-02-23: qty 30

## 2015-02-23 MED ORDER — FENTANYL CITRATE (PF) 250 MCG/5ML IJ SOLN
INTRAMUSCULAR | Status: AC
  Filled 2015-02-23: qty 5

## 2015-02-23 MED ORDER — HYDROMORPHONE HCL 1 MG/ML IJ SOLN
0.2500 mg | INTRAMUSCULAR | Status: DC | PRN
  Administered 2015-02-23: 0.5 mg via INTRAVENOUS

## 2015-02-23 MED ORDER — SUCCINYLCHOLINE CHLORIDE 20 MG/ML IJ SOLN
INTRAMUSCULAR | Status: DC | PRN
  Administered 2015-02-23: 80 mg via INTRAVENOUS

## 2015-02-23 MED ORDER — EPHEDRINE SULFATE 50 MG/ML IJ SOLN
INTRAMUSCULAR | Status: AC
  Filled 2015-02-23: qty 1

## 2015-02-23 MED ORDER — ONDANSETRON HCL 4 MG/2ML IJ SOLN
INTRAMUSCULAR | Status: AC
  Filled 2015-02-23: qty 2

## 2015-02-23 MED ORDER — MEPERIDINE HCL 25 MG/ML IJ SOLN
INTRAMUSCULAR | Status: AC
  Administered 2015-02-23: 25 mg via INTRAVENOUS
  Filled 2015-02-23: qty 1

## 2015-02-23 MED ORDER — METHOCARBAMOL 750 MG PO TABS: 750.0000 mg | ORAL_TABLET | Freq: Two times a day (BID) | ORAL | Status: DC | PRN

## 2015-02-23 MED ORDER — FENOFIBRATE 160 MG PO TABS
160.0000 mg | ORAL_TABLET | Freq: Every day | ORAL | Status: DC
  Administered 2015-02-23 – 2015-02-27 (×5): 160 mg via ORAL
  Filled 2015-02-23 (×5): qty 1

## 2015-02-23 MED ORDER — IRBESARTAN 300 MG PO TABS
300.0000 mg | ORAL_TABLET | Freq: Every day | ORAL | Status: DC
  Administered 2015-02-23 – 2015-02-27 (×5): 300 mg via ORAL
  Filled 2015-02-23 (×6): qty 1

## 2015-02-23 MED ORDER — INSULIN ASPART 100 UNIT/ML ~~LOC~~ SOLN
0.0000 [IU] | Freq: Every day | SUBCUTANEOUS | Status: DC
  Administered 2015-02-23: 2 [IU] via SUBCUTANEOUS

## 2015-02-23 MED ORDER — HYDROMORPHONE HCL 1 MG/ML IJ SOLN
INTRAMUSCULAR | Status: AC
  Administered 2015-02-23: 0.5 mg via INTRAVENOUS
  Filled 2015-02-23: qty 1

## 2015-02-23 MED ORDER — MENTHOL 3 MG MT LOZG: 1.0000 | LOZENGE | OROMUCOSAL | Status: DC | PRN

## 2015-02-23 MED ORDER — SODIUM CHLORIDE 0.9 % IR SOLN
Status: DC | PRN
  Administered 2015-02-23: 1000 mL
  Administered 2015-02-23: 3000 mL

## 2015-02-23 MED ORDER — ONDANSETRON HCL 4 MG PO TABS
4.0000 mg | ORAL_TABLET | Freq: Four times a day (QID) | ORAL | Status: DC | PRN
  Administered 2015-02-27: 4 mg via ORAL
  Filled 2015-02-23: qty 1

## 2015-02-23 SURGICAL SUPPLY — 66 items
ADH SKN CLS APL DERMABOND .7 (GAUZE/BANDAGES/DRESSINGS) ×1
BLADE SAW SGTL 18X1.27X75 (BLADE) ×2 IMPLANT
BNDG COHESIVE 6X5 TAN STRL LF (GAUZE/BANDAGES/DRESSINGS) ×2 IMPLANT
CAPT HIP TOTAL 2 ×1 IMPLANT
CELLS DAT CNTRL 66122 CELL SVR (MISCELLANEOUS) ×1 IMPLANT
CLSR STERI-STRIP ANTIMIC 1/2X4 (GAUZE/BANDAGES/DRESSINGS) ×1 IMPLANT
COVER SURGICAL LIGHT HANDLE (MISCELLANEOUS) ×2 IMPLANT
DERMABOND ADVANCED (GAUZE/BANDAGES/DRESSINGS) ×1
DERMABOND ADVANCED .7 DNX12 (GAUZE/BANDAGES/DRESSINGS) IMPLANT
DRAPE C-ARM 42X72 X-RAY (DRAPES) ×2 IMPLANT
DRAPE IMP U-DRAPE 54X76 (DRAPES) ×2 IMPLANT
DRAPE STERI IOBAN 125X83 (DRAPES) ×2 IMPLANT
DRAPE U-SHAPE 47X51 STRL (DRAPES) ×6 IMPLANT
DRESSING AQUACEL AQ EXTRA 4X5 (GAUZE/BANDAGES/DRESSINGS) ×1 IMPLANT
DRSG AQUACEL AG ADV 3.5X10 (GAUZE/BANDAGES/DRESSINGS) ×3 IMPLANT
DRSG MEPILEX BORDER 4X8 (GAUZE/BANDAGES/DRESSINGS) IMPLANT
DURAPREP 26ML APPLICATOR (WOUND CARE) ×2 IMPLANT
ELECT BLADE 4.0 EZ CLEAN MEGAD (MISCELLANEOUS) ×2
ELECT REM PT RETURN 9FT ADLT (ELECTROSURGICAL) ×2
ELECTRODE BLDE 4.0 EZ CLN MEGD (MISCELLANEOUS) ×1 IMPLANT
ELECTRODE REM PT RTRN 9FT ADLT (ELECTROSURGICAL) ×1 IMPLANT
ELIMINATOR HOLE APEX DEPUY (Hips) IMPLANT
FACESHIELD WRAPAROUND (MASK) ×2 IMPLANT
FACESHIELD WRAPAROUND OR TEAM (MASK) ×1 IMPLANT
GLOVE BIO SURGEON STRL SZ7.5 (GLOVE) ×1 IMPLANT
GLOVE BIOGEL PI IND STRL 6.5 (GLOVE) IMPLANT
GLOVE BIOGEL PI INDICATOR 6.5 (GLOVE) ×1
GLOVE NEODERM STRL 7.5 LF PF (GLOVE) ×2 IMPLANT
GLOVE SURG NEODERM 7.5  LF PF (GLOVE) ×2
GLOVE SURG SS PI 6.5 STRL IVOR (GLOVE) ×1 IMPLANT
GLOVE SURG SYN 7.5  E (GLOVE) ×1
GLOVE SURG SYN 7.5 E (GLOVE) ×1 IMPLANT
GLOVE SURG SYN 7.5 PF PI (GLOVE) ×1 IMPLANT
GOWN SRG XL XLNG 56XLVL 4 (GOWN DISPOSABLE) ×1 IMPLANT
GOWN STRL NON-REIN XL XLG LVL4 (GOWN DISPOSABLE) ×2
GOWN STRL REUS W/ TWL LRG LVL3 (GOWN DISPOSABLE) IMPLANT
GOWN STRL REUS W/TWL LRG LVL3 (GOWN DISPOSABLE) ×8
HANDPIECE INTERPULSE COAX TIP (DISPOSABLE) ×2
KIT BASIN OR (CUSTOM PROCEDURE TRAY) ×2 IMPLANT
MARKER SKIN DUAL TIP RULER LAB (MISCELLANEOUS) ×2 IMPLANT
NDL HYPO 25GX1X1/2 BEV (NEEDLE) IMPLANT
NEEDLE HYPO 25GX1X1/2 BEV (NEEDLE) ×2 IMPLANT
PACK TOTAL JOINT (CUSTOM PROCEDURE TRAY) ×2 IMPLANT
PACK UNIVERSAL I (CUSTOM PROCEDURE TRAY) ×2 IMPLANT
PADDING CAST COTTON 6X4 STRL (CAST SUPPLIES) ×2 IMPLANT
RETRACTOR WND ALEXIS 18 MED (MISCELLANEOUS) ×1 IMPLANT
RTRCTR WOUND ALEXIS 18CM MED (MISCELLANEOUS) ×2
SEALER BIPOLAR AQUA 6.0 (INSTRUMENTS) ×1 IMPLANT
SET HNDPC FAN SPRY TIP SCT (DISPOSABLE) ×1 IMPLANT
SOLUTION BETADINE 4OZ (MISCELLANEOUS) ×2 IMPLANT
STRIP CLOSURE SKIN 1/2X4 (GAUZE/BANDAGES/DRESSINGS) ×2 IMPLANT
SUT ETHIBOND 2 V 37 (SUTURE) ×2 IMPLANT
SUT ETHIBOND NAB CT1 #1 30IN (SUTURE) ×6 IMPLANT
SUT ETHILON 3 0 FSL (SUTURE) ×2 IMPLANT
SUT MNCRL AB 4-0 PS2 18 (SUTURE) ×1 IMPLANT
SUT VIC AB 0 CT1 27 (SUTURE) ×4
SUT VIC AB 0 CT1 27XBRD ANBCTR (SUTURE) ×1 IMPLANT
SUT VIC AB 1 CT1 27 (SUTURE) ×4
SUT VIC AB 1 CT1 27XBRD ANBCTR (SUTURE) ×1 IMPLANT
SUT VIC AB 2-0 CT1 27 (SUTURE) ×6
SUT VIC AB 2-0 CT1 TAPERPNT 27 (SUTURE) ×2 IMPLANT
SUT VIC AB 2-0 FS1 27 (SUTURE) ×1 IMPLANT
SYR 20CC LL (SYRINGE) ×2 IMPLANT
TOWEL OR 17X26 10 PK STRL BLUE (TOWEL DISPOSABLE) ×2 IMPLANT
TRAY FOLEY CATH 16FR SILVER (SET/KITS/TRAYS/PACK) ×2 IMPLANT
YANKAUER SUCT BULB TIP NO VENT (SUCTIONS) ×1 IMPLANT

## 2015-02-23 NOTE — Anesthesia Preprocedure Evaluation (Addendum)
Anesthesia Evaluation  Patient identified by MRN, date of birth, ID band Patient awake    Reviewed: Allergy & Precautions, NPO status , Patient's Chart, lab work & pertinent test results  History of Anesthesia Complications (+) PONV  Airway Mallampati: III  TM Distance: >3 FB Neck ROM: Full    Dental  (+) Teeth Intact, Dental Advisory Given   Pulmonary former smoker,    breath sounds clear to auscultation       Cardiovascular hypertension, Pt. on medications  Rhythm:Regular Rate:Normal     Neuro/Psych negative neurological ROS     GI/Hepatic Neg liver ROS, GERD  ,  Endo/Other  diabetes, Type 2, Oral Hypoglycemic AgentsHypothyroidism Morbid obesity  Renal/GU CRFRenal disease     Musculoskeletal  (+) Arthritis ,   Abdominal   Peds  Hematology negative hematology ROS (+)   Anesthesia Other Findings Allergy to Ether was severe nausea and vomiting.  Reproductive/Obstetrics                            Anesthesia Physical Anesthesia Plan  ASA: III  Anesthesia Plan: Spinal   Post-op Pain Management:    Induction: Intravenous  Airway Management Planned: Natural Airway and Simple Face Mask  Additional Equipment:   Intra-op Plan:   Post-operative Plan:   Informed Consent: I have reviewed the patients History and Physical, chart, labs and discussed the procedure including the risks, benefits and alternatives for the proposed anesthesia with the patient or authorized representative who has indicated his/her understanding and acceptance.     Plan Discussed with: CRNA  Anesthesia Plan Comments:         Anesthesia Quick Evaluation

## 2015-02-23 NOTE — Anesthesia Procedure Notes (Addendum)
Spinal Patient location during procedure: OR Staffing Anesthesiologist: Nolon Nations Performed by: anesthesiologist  Preanesthetic Checklist Completed: patient identified, site marked, surgical consent, pre-op evaluation, timeout performed, IV checked, risks and benefits discussed and monitors and equipment checked Spinal Block Patient position: sitting Prep: ChloraPrep Patient monitoring: heart rate, continuous pulse ox and blood pressure Approach: right paramedian Location: L3-4 Injection technique: single-shot Needle Needle type: Sprotte  Needle gauge: 24 G Needle length: 9 cm Additional Notes Expiration date of kit checked and confirmed. Patient tolerated procedure well, without complications.    Procedure Name: Intubation Date/Time: 02/23/2015 2:04 PM Performed by: Suzy Bouchard Pre-anesthesia Checklist: Patient identified, Emergency Drugs available, Suction available, Patient being monitored and Timeout performed Patient Re-evaluated:Patient Re-evaluated prior to inductionOxygen Delivery Method: Circle system utilized Preoxygenation: Pre-oxygenation with 100% oxygen Intubation Type: IV induction Ventilation: Mask ventilation without difficulty Laryngoscope Size: Mac and 3 Grade View: Grade I Tube type: Oral Tube size: 7.0 mm Number of attempts: 2 Airway Equipment and Method: Stylet Placement Confirmation: ETT inserted through vocal cords under direct vision,  positive ETCO2 and breath sounds checked- equal and bilateral Secured at: 22 cm Tube secured with: Tape Dental Injury: Teeth and Oropharynx as per pre-operative assessment

## 2015-02-23 NOTE — Progress Notes (Signed)
Utilization review completed.  

## 2015-02-23 NOTE — Anesthesia Postprocedure Evaluation (Signed)
  Anesthesia Post-op Note  Patient: Cassandra Dodson  Procedure(s) Performed: Procedure(s): RIGHT TOTAL HIP ARTHROPLASTY ANTERIOR APPROACH (Right)  Patient Location: PACU  Anesthesia Type:General  Level of Consciousness: awake and alert   Airway and Oxygen Therapy: Patient Spontanous Breathing  Post-op Pain: Controlled  Post-op Assessment: Post-op Vital signs reviewed, Patient's Cardiovascular Status Stable and Respiratory Function Stable  Post-op Vital Signs: Reviewed  Filed Vitals:   02/23/15 1645  BP: 103/50  Pulse: 73  Temp:   Resp: 18    Complications: No apparent anesthesia complications

## 2015-02-23 NOTE — H&P (Signed)
PREOPERATIVE H&P  Chief Complaint: Right hip osteoarthritis  HPI: Cassandra Dodson is a 74 y.o. female who presents for surgical treatment of Right hip osteoarthritis.  She denies any changes in medical history.  Past Medical History  Diagnosis Date  . PONV (postoperative nausea and vomiting)     severe nausea and vomiting after knee replacement  . Hypertension   . Diabetes mellitus without complication ([place])   . Depression   . Urinary frequency   . GERD (gastroesophageal reflux disease)   . Arthritis   . Neuromuscular disorder (H[facility])     neuropathy in feet  . Hypothyroidism   . CKD (chronic kidney disease), stage III    Past Surgical History  Procedure Laterality Date  . Joint replacement Right     right  . Cesarean section    . Irrigation and debridement sebaceous cyst    . Tonsillectomy    . Breast surgery      cyst removed  . Teeth extration     Social History   Social History  . Marital Status: Divorced    Spouse Name: N/A  . Number of Children: N/A  . Years of Education: N/A   Social History Main Topics  . Smoking status: Former Smoker -- 1.50 packs/day for 45 years    Types: Cigarettes    Quit date: 04/22/2002  . Smokeless tobacco: Never Used  . Alcohol Use: Yes     Comment: rarely  . Drug Use: No  . Sexual Activity: Not on file   Other Topics Concern  . Not on file   Social History Narrative   No family history on file. Allergies  Allergen Reactions  . Ether    Prior to Admission medications   Medication Sig Start Date End Date Taking? Authorizing Provider  aspirin 81 MG tablet Take 81 mg by mouth daily.   Yes Historical Provider, MD  Calcium Carb-Cholecalciferol (CALCIUM 600 + D PO) Take 1 tablet by mouth daily.   Yes Historical Provider, MD  fenofibrate 160 MG tablet Take 160 mg by mouth daily.   Yes Historical Provider, MD  HYDROcodone-acetaminophen (NORCO) 7.5-325 MG tablet Take 1 tablet by mouth every 6 (six) hours as needed for moderate  pain.   Yes Historical Provider, MD  levothyroxine (SYNTHROID, LEVOTHROID) 50 MCG tablet Take 50 mcg by mouth every evening.   Yes Historical Provider, MD  metFORMIN (GLUCOPHAGE) 1000 MG tablet Take 1 tablet by mouth 2 (two) times daily. 01/21/15  Yes Historical Provider, MD  Multiple Vitamins-Minerals (MULTIVITAMIN WITH MINERALS) tablet Take 1 tablet by mouth daily.   Yes Historical Provider, MD  saxagliptin HCl (ONGLYZA) 2.5 MG TABS tablet Take 2.5 mg by mouth daily.   Yes Historical Provider, MD  telmisartan-hydrochlorothiazide (MICARDIS HCT) 80-25 MG tablet Take 1 tablet by mouth daily.   Yes Historical Provider, MD  vitamin C (ASCORBIC ACID) 500 MG tablet Take 500 mg by mouth daily.   Yes Historical Provider, MD     Positive ROS: All other systems have been reviewed and were otherwise negative with the exception of those mentioned in the HPI and as above.  Physical Exam: General: Alert, no acute distress Cardiovascular: No pedal edema Respiratory: No cyanosis, no use of accessory musculature GI: abdomen soft Skin: No lesions in the area of chief complaint Neurologic: Sensation intact distally Psychiatric: Patient is competent for consent with normal mood and affect Lymphatic: no lymphedema  MUSCULOSKELETAL: exam stable  Assessment: Right hip osteoarthritis  Plan:  Plan for Procedure(s): RIGHT TOTAL HIP ARTHROPLASTY ANTERIOR APPROACH  The risks benefits and alternatives were discussed with the patient including but not limited to the risks of nonoperative treatment, versus surgical intervention including infection, bleeding, nerve injury,  blood clots, cardiopulmonary complications, morbidity, mortality, among others, and they were willing to proceed.   Marianna Payment, MD   02/23/2015 7:15 AM

## 2015-02-23 NOTE — Op Note (Signed)
RIGHT TOTAL HIP ARTHROPLASTY ANTERIOR APPROACH  Procedure Note Cassandra Dodson   [id number]  Pre-op Diagnosis: Right hip osteoarthritis     Post-op Diagnosis: same   Operative Procedures  1. Total hip replacement; Right hip; uncemented [id number]   Personnel  Surgeon(s): Leandrew Koyanagi, MD   Anesthesia: spinal, general  Prosthesis: Depuy Acetabulum: Pinnacle 52 mm, +4 lateralized liner Femur: Corail KLA 12 Head: 36 size: +1.5 Bearing Type: Ceramic on poly  Date of Service: 02/23/2015  Total Hip Arthroplasty (Anterior Approach) Op Note:  After informed consent was obtained and the operative extremity marked in the holding area, the patient was brought back to the operating room and placed supine on the HANA table. Next, the operative extremity was prepped and draped in normal sterile fashion. Surgical timeout occurred verifying patient identification, surgical site, surgical procedure and administration of antibiotics.  A modified anterior Smith-Peterson approach to the hip was performed, using the interval between tensor fascia lata and sartorius.  Dissection was carried bluntly down onto the anterior hip capsule. The lateral femoral circumflex vessels were identified and coagulated. A capsulotomy was performed and the capsular flaps tagged for later repair.  Fluoroscopy was utilized to prepare for the femoral neck cut. The neck osteotomy was performed. The femoral head was removed, the acetabular rim was cleared of soft tissue and attention was turned to reaming the acetabulum.  Sequential reaming was performed under fluoroscopic guidance. We reamed to a size 52 mm, and then impacted the acetabular shell. The liner was then placed after irrigation and attention turned to the femur.  After placing the femoral hook, the leg was taken to externally rotated, extended and adducted position taking care to perform soft tissue releases to allow for adequate mobilization of the femur. Soft tissue was  cleared from the shoulder of the greater trochanter and the hook elevator used to improve exposure of the proximal femur. Sequential broaching performed up to a size 12. Trial neck and head were placed. The leg was brought back up to neutral and the construct reduced. The position and sizing of components, offset and leg lengths were checked using fluoroscopy. Stability of the construct was checked in extension and external rotation without any subluxation or impingement of prosthesis. We dislocated the prosthesis, dropped the leg back into position, removed trial components, and irrigated copiously. The final stem and head was then placed, the leg brought back up, the system reduced and fluoroscopy used to verify positioning.  We irrigated, obtained hemostasis and closed the capsule using #2 ethibond suture.  Dilute betadyne solution was used. The fascia was closed with #1 vicryl plus, the deep fat layer was closed with 0 vicryl, the subcutaneous layers closed with 2.0 Vicryl Plus and the skin closed with 4.0 monocryl and dermabond. A sterile dressing was applied. The patient was awakened in the operating room and taken to recovery in stable condition.  All sponge, needle, and instrument counts were correct at the end of the case.   Position: supine  Complications: none.  Time Out: performed   Drains/Packing: none  Estimated blood loss: 200 cc  Returned to Recovery Room: in good condition.   Antibiotics: yes   Mechanical VTE (DVT) Prophylaxis: sequential compression devices, TED thigh-high  Chemical VTE (DVT) Prophylaxis: aspirin   Fluid Replacement: see anesthesia record  Specimens Removed: 1 to pathology   Sponge and Instrument Count Correct? yes   PACU: portable radiograph - low AP   Admission: inpatient status, start PT & OT  POD#1  Plan/RTC: Return in 2 weeks for staple removal. Return in 6 weeks to see MD.  Weight Bearing/Load Lower Extremity: full  Hip precautions: none Suture  Removal: 10-14 days    N. Eduard Roux, MD [facility] [phone] 3:35 PM      Implant Name Type Inv. Item Serial No. Manufacturer Lot No. LRB No. Used  PIN SECTOR W/GRIP ACE CUP 52MM - [id number] Hips PIN SECTOR W/GRIP ACE CUP 52MM  DEPUY [id number] Right 1  APEX HOLE ELIM DEPUY - [id number] Hips APEX HOLE ELIM DEPUY  DEPUY [id number] Right 1  APEX HOLE ELIM DEPUY - [id number] Hips APEX HOLE ELIM DEPUY  DEPUY [id number] Right 1  LINER NEUTRAL 52X36MM PLUS 4 - [id number] Liner LINER NEUTRAL 52X36MM PLUS 4  DEPUY [id number] Right 1  HEAD CERAMIC DELTA 36 PLUS 1.5 - [id number] Hips HEAD CERAMIC DELTA 36 PLUS 1.5  DEPUY [id number] Right 1  STEM [facility] - [id number] Stem STEM CORAIL [id number]   DEPUY [id number] Right 1

## 2015-02-23 NOTE — Transfer of Care (Signed)
Immediate Anesthesia Transfer of Care Note  Patient: Cassandra Dodson  Procedure(s) Performed: Procedure(s): RIGHT TOTAL HIP ARTHROPLASTY ANTERIOR APPROACH (Right)  Patient Location: PACU  Anesthesia Type:GA combined with regional for post-op pain  Level of Consciousness: awake, alert  and oriented  Airway & Oxygen Therapy: Patient Spontanous Breathing and Patient connected to nasal cannula oxygen  Post-op Assessment: Report given to RN and Post -op Vital signs reviewed and stable  Post vital signs: Reviewed and stable  Last Vitals:  Filed Vitals:   02/23/15 1001  BP: 141/51  Pulse: 95  Temp: 36.4 C  Resp: 18    Complications: No apparent anesthesia complications

## 2015-02-23 NOTE — Discharge Instructions (Signed)

## 2015-02-24 ENCOUNTER — Encounter (HOSPITAL_COMMUNITY): Payer: Self-pay

## 2015-02-24 LAB — CBC
HEMATOCRIT: 32.8 % — AB (ref 36.0–46.0)
Hemoglobin: 10.7 g/dL — ABNORMAL LOW (ref 12.0–15.0)
MCH: 29.5 pg (ref 26.0–34.0)
MCHC: 32.6 g/dL (ref 30.0–36.0)
MCV: 90.4 fL (ref 78.0–100.0)
Platelets: 145 10*3/uL — ABNORMAL LOW (ref 150–400)
RBC: 3.63 MIL/uL — ABNORMAL LOW (ref 3.87–5.11)
RDW: 14.4 % (ref 11.5–15.5)
WBC: 7.1 10*3/uL (ref 4.0–10.5)

## 2015-02-24 LAB — BASIC METABOLIC PANEL
ANION GAP: 10 (ref 5–15)
BUN: 30 mg/dL — ABNORMAL HIGH (ref 6–20)
CALCIUM: 8.7 mg/dL — AB (ref 8.9–10.3)
CO2: 26 mmol/L (ref 22–32)
Chloride: 99 mmol/L — ABNORMAL LOW (ref 101–111)
Creatinine, Ser: 1.48 mg/dL — ABNORMAL HIGH (ref 0.44–1.00)
GFR, EST AFRICAN AMERICAN: 39 mL/min — AB (ref >60)
GFR, EST NON AFRICAN AMERICAN: 33 mL/min — AB (ref >60)
Glucose, Bld: 219 mg/dL — ABNORMAL HIGH (ref 65–99)
Potassium: 4.1 mmol/L (ref 3.5–5.1)
Sodium: 135 mmol/L (ref 135–145)

## 2015-02-24 LAB — GLUCOSE, CAPILLARY
GLUCOSE-CAPILLARY: 231 mg/dL — AB (ref 65–99)
GLUCOSE-CAPILLARY: 224 mg/dL — AB (ref 65–99)
Glucose-Capillary: 151 mg/dL — ABNORMAL HIGH (ref 65–99)
Glucose-Capillary: 190 mg/dL — ABNORMAL HIGH (ref 65–99)

## 2015-02-24 MED ORDER — SODIUM CHLORIDE 0.9 % IV SOLN
1.5000 g | Freq: Four times a day (QID) | INTRAVENOUS | Status: DC
  Administered 2015-02-24 – 2015-02-27 (×12): 1.5 g via INTRAVENOUS
  Filled 2015-02-24 (×20): qty 1.5

## 2015-02-24 MED ORDER — METFORMIN HCL 500 MG PO TABS
1000.0000 mg | ORAL_TABLET | Freq: Two times a day (BID) | ORAL | Status: DC
  Administered 2015-02-24 – 2015-02-27 (×7): 1000 mg via ORAL
  Filled 2015-02-24 (×7): qty 2

## 2015-02-24 NOTE — Evaluation (Signed)
Physical Therapy Evaluation Patient Details Name: Cassandra Dodson MRN: [id number] DOB: Jun 03, 1940 Today's Date: 02/24/2015   History of Present Illness  Pt s/p direct anterior rt THA. PMH - rt TKA, HTN, DM  Clinical Impression  Pt admitted with above diagnosis and presents to PT with functional limitations due to deficits listed below (See PT problem list). Pt needs skilled PT to maximize independence and safety to allow discharge to SNF prior to return home. Pt has prearranged going to [facility] for ST-SNF since her son and dtr in law both work. Appears to be appropriate level of care. Pt fatigued very quickly and barely made it back to the bed with HR 138 and dyspnea 3/4.     Follow Up Recommendations SNF (Pt has made arrangements with [facility]    Equipment Recommendations  None recommended by PT    Recommendations for Other Services       Precautions / Restrictions Precautions Precautions: Fall Restrictions Weight Bearing Restrictions: Yes RLE Weight Bearing: Weight bearing as tolerated      Mobility  Bed Mobility Overal bed mobility: Needs Assistance Bed Mobility: Supine to Sit;Sit to Supine     Supine to sit: Min assist Sit to supine: Min assist   General bed mobility comments: Assist to bring RLE off bed and to bring RLE back up into bed.  Transfers Overall transfer level: Needs assistance Equipment used: Rolling walker (2 wheeled) Transfers: Sit to/from Stand Sit to Stand: Min assist         General transfer comment: Verbal cues for hand placement and assist to bring hips up.  Ambulation/Gait Ambulation/Gait assistance: Mod assist;Min assist Ambulation Distance (Feet): 20 Feet Assistive device: Rolling walker (2 wheeled) Gait Pattern/deviations: Step-through pattern;Decreased step length - right;Decreased step length - left;Decreased stance time - right;Shuffle;Trunk flexed Gait velocity: decr Gait velocity interpretation: Below normal speed for  age/gender General Gait Details: Pt initially min A with verbal cues to stand more erect. Pt fatigued very quickly and began to require mod A as pt with incr trunk flex as well as bil hip and knee flex. Once back to EOB pt with HR of 138 and SpO2 95% on RA.   Stairs            Wheelchair Mobility    Modified Rankin (Stroke Patients Only)       Balance Overall balance assessment: Needs assistance Sitting-balance support: No upper extremity supported;Feet supported Sitting balance-Leahy Scale: Good     Standing balance support: Bilateral upper extremity supported Standing balance-Leahy Scale: Poor Standing balance comment: support of walker and min A for static standing.                             Pertinent Vitals/Pain Pain Assessment: 0-10 Pain Score: 5  Pain Location: rt hip Pain Descriptors / Indicators: Aching;Operative site guarding Pain Intervention(s): Limited activity within patient's tolerance;Monitored during session;Premedicated before session;Repositioned    Home Living Family/patient expects to be discharged to:: Private residence Living Arrangements: Children Available Help at Discharge: Family;Available PRN/intermittently   Home Access: Elevator     Home Layout: One level Home Equipment: Walker - 2 wheels;Cane - single point;Crutches      Prior Function Level of Independence: Needs assistance   Gait / Transfers Assistance Needed: modified independent with rolling walker           Hand Dominance        Extremity/Trunk Assessment   Upper  Extremity Assessment: Defer to OT evaluation           Lower Extremity Assessment: Generalized weakness;RLE deficits/detail RLE Deficits / Details: Assist to move against gravity       Communication   Communication: No difficulties  Cognition Arousal/Alertness: Awake/alert Behavior During Therapy: WFL for tasks assessed/performed Overall Cognitive Status: Within Functional Limits  for tasks assessed                      General Comments      Exercises        Assessment/Plan    PT Assessment Patient needs continued PT services  PT Diagnosis Difficulty walking;Generalized weakness;Acute pain   PT Problem List Decreased strength;Decreased activity tolerance;Decreased balance;Decreased mobility;Decreased knowledge of use of DME  PT Treatment Interventions DME instruction;Gait training;Functional mobility training;Therapeutic activities;Therapeutic exercise;Patient/family education   PT Goals (Current goals can be found in the Care Plan section) Acute Rehab PT Goals Patient Stated Goal: Go to [facility] and then return home PT Goal Formulation: With patient Time For Goal Achievement: 03/10/15 Potential to Achieve Goals: Good    Frequency 7X/week   Barriers to discharge        Co-evaluation               End of Session Equipment Utilized During Treatment: Oxygen Activity Tolerance: Patient limited by fatigue Patient left: in bed;with call bell/phone within reach;with nursing/sitter in room Nurse Communication: Mobility status         Time: 8502-7741 PT Time Calculation (min) (ACUTE ONLY): 25 min   Charges:   PT Evaluation $Initial PT Evaluation Tier I: 1 Procedure PT Treatments $Gait Training: 8-22 mins   PT G Codes:        Kasean Denherder 2015/03/02, 10:01 AM Suanne Marker PT [phone]

## 2015-02-24 NOTE — Anesthesia Postprocedure Evaluation (Signed)
  Anesthesia Post-op Note  Patient: Cassandra Dodson  Procedure(s) Performed: Procedure(s): RIGHT TOTAL HIP ARTHROPLASTY ANTERIOR APPROACH (Right)  Patient Location: Nursing Unit  Anesthesia Type:General  Level of Consciousness: awake, alert , oriented and patient cooperative  Airway and Oxygen Therapy: Patient Spontanous Breathing  Post-op Pain: moderate  Post-op Assessment: Post-op Vital signs reviewed, Patient's Cardiovascular Status Stable, Respiratory Function Stable, Patent Airway, No signs of Nausea or vomiting and Adequate PO intake LLE Motor Response: Responds to commands LLE Sensation: Decreased RLE Motor Response: Responds to commands RLE Sensation: Decreased L Sensory Level: L3-Anterior knee, lower leg R Sensory Level:  (able to lift non operative leg off bed)  Post-op Vital Signs: Reviewed and stable  Last Vitals:  Filed Vitals:   02/24/15 0603  BP: 144/65  Pulse: 108  Temp: 37.9 C  Resp: 15    Complications: No apparent anesthesia complications.  Encouraged pulmonary toilet (deep breaths, coughs).

## 2015-02-24 NOTE — Progress Notes (Signed)
ANTIBIOTIC CONSULT NOTE - INITIAL  Pharmacy Consult for Unasyn Indication: aspiration PNA  Allergies  Allergen Reactions  . Ether     Patient Measurements: Weight: 207 lb (93.895 kg)   Vital Signs: Temp: 100.2 F (37.9 C) (11/04 0603) Temp Source: Oral (11/04 0603) BP: 144/65 mmHg (11/04 0603) Pulse Rate: 108 (11/04 0603) Intake/Output from previous day: 11/03 0701 - 11/04 0700 In: 1750 [I.V.:1500; IV Piggyback:250] Out: 150 [Urine:150] Intake/Output from this shift:    Labs:  Recent Labs  02/24/15 0456  WBC 7.1  HGB 10.7*  PLT 145*  CREATININE 1.48*   CrCl cannot be calculated (Unknown ideal weight.). No results for input(s): VANCOTROUGH, VANCOPEAK, VANCORANDOM, GENTTROUGH, GENTPEAK, GENTRANDOM, TOBRATROUGH, TOBRAPEAK, TOBRARND, AMIKACINPEAK, AMIKACINTROU, AMIKACIN in the last 72 hours.   Microbiology: Recent Results (from the past 720 hour(s))  Surgical pcr screen     Status: None   Collection Time: 02/13/15  1:05 PM  Result Value Ref Range Status   MRSA, PCR NEGATIVE NEGATIVE Final   Staphylococcus aureus NEGATIVE NEGATIVE Final    Comment:        The Xpert SA Assay (FDA approved for NASAL specimens in patients over 44 years of age), is one component of a comprehensive surveillance program.  Test performance has been validated by [facility] for patients greater than or equal to 39 year old. It is not intended to diagnose infection nor to guide or monitor treatment.    Assessment: 74 YOF s/p total hip replacement who had aspiration event intra-op and is to start Unasyn x7 days for coverage. WBC 7.1, tmax/24h 100.2.  SCr 1.48- she has history of CKDIII.   Goal of Therapy:  proper dosing based on renal and hepatic function  Plan:  -Unasyn 1.5g IV q6h x7 days -follow c/s, clinical progression, renal function -follow for ability to change to PO Augmentin when able  Bernarr Longsworth D. Cohen Boettner, PharmD, BCPS Clinical Pharmacist Pager: [phone] 02/24/2015  8:22 AM

## 2015-02-24 NOTE — Progress Notes (Signed)
Pt transferred from 5w to unit at 1035; VSS; pt oriented to the unit and room; son at bedside; incision dsg clean, dry and intact without stain; pt in bed comfortably with call light within reach. Pt stable during shift and reported off to oncoming RN. Francis Gaines Kentrell Guettler RN.

## 2015-02-24 NOTE — Clinical Social Work Note (Signed)
CSW received consult for SNF for patient.  Patient has preregistered at [facility], [place] said they can accept patient on Sunday, [place] notified physician who will complete discharge summary and FL2.  CSW to continue to follow patient's progress.  Jones Broom. [place], MSW, [place] 02/24/2015 5:54 PM

## 2015-02-24 NOTE — Progress Notes (Signed)
Report called to [address]

## 2015-02-24 NOTE — Progress Notes (Signed)
Physical Therapy Treatment Patient Details Name: Cassandra Dodson MRN: [id number] DOB: 08-20-1940 Today's Date: 02/24/2015    History of Present Illness Pt s/p direct anterior rt THA. PMH - rt TKA, HTN, DM    PT Comments    Pt continues to be limited by fatiguing very quickly. Pt with HR to 130 with minimal ambulation.  Follow Up Recommendations  SNF (Pt has made arrangements with [facility].)     Equipment Recommendations  None recommended by PT    Recommendations for Other Services       Precautions / Restrictions Precautions Precautions: Fall Restrictions Weight Bearing Restrictions: Yes RLE Weight Bearing: Weight bearing as tolerated    Mobility  Bed Mobility Overal bed mobility: Needs Assistance Bed Mobility: Supine to Sit     Supine to sit: Min assist     General bed mobility comments: Assist to bring RLE off bed and to bring RLE back up into bed.  Transfers Overall transfer level: Needs assistance Equipment used: Rolling walker (2 wheeled) Transfers: Sit to/from Stand Sit to Stand: Min assist         General transfer comment: Verbal cues for hand placement and assist for balance.  Ambulation/Gait Ambulation/Gait assistance: Min assist Ambulation Distance (Feet): 12 Feet Assistive device: Rolling walker (2 wheeled) Gait Pattern/deviations: Step-through pattern;Decreased step length - right;Decreased stance time - right Gait velocity: decreased but attempts to move too fast when she fatigues. Gait velocity interpretation: Below normal speed for age/gender General Gait Details: Verbal cues to stand more erect. Pt fatigues quickly with HR going to 130's. SpO2 93% on 2L with amb. As pt fatigues pt tries to rush and needs cues to slow down   Stairs            Wheelchair Mobility    Modified Rankin (Stroke Patients Only)       Balance     Sitting balance-Leahy Scale: Good     Standing balance support: Bilateral upper extremity  supported Standing balance-Leahy Scale: Poor Standing balance comment: Walker and min guard for static standing.                    Cognition Arousal/Alertness: Awake/alert Behavior During Therapy: WFL for tasks assessed/performed Overall Cognitive Status: Within Functional Limits for tasks assessed                      Exercises Total Joint Exercises Ankle Circles/Pumps: 10 reps;Both;Supine;AROM Quad Sets: Right;5 reps;Supine;AROM Heel Slides: AAROM;Right;5 reps Hip ABduction/ADduction: AAROM;5 reps;Supine    General Comments        Pertinent Vitals/Pain Pain Assessment: Faces Faces Pain Scale: Hurts little more Pain Location: rt hip Pain Descriptors / Indicators: Grimacing;Guarding Pain Intervention(s): Limited activity within patient's tolerance;Monitored during session;Repositioned    Home Living                      Prior Function            PT Goals (current goals can now be found in the care plan section) Acute Rehab PT Goals Patient Stated Goal: Go to [facility] and then return home PT Goal Formulation: With patient Time For Goal Achievement: 03/10/15 Potential to Achieve Goals: Good Progress towards PT goals: Progressing toward goals    Frequency  7X/week    PT Plan Current plan remains appropriate    Co-evaluation             End of Session Equipment Utilized  During Treatment: Oxygen;Gait belt Activity Tolerance: Patient limited by fatigue Patient left: with call bell/phone within reach;in chair;with family/visitor present     Time: 1914-7829 PT Time Calculation (min) (ACUTE ONLY): 18 min  Charges:  $Gait Training: 8-22 mins                    G Codes:      Natajah Derderian 03-13-2015, 4:07 PM [employer] PT [phone]

## 2015-02-24 NOTE — NC FL2 (Signed)
[place] LEVEL OF CARE SCREENING TOOL     IDENTIFICATION  Patient Name: Cassandra Dodson Birthdate: 1941/03/20 Sex: female Admission Date (Current Location): 02/23/2015  [facility] and [place] Number: Herbalist and Address:  [facility] [place], [place] [address], [place], [place] [address]      Provider Number: [id number]  Attending Physician Name and Address:  Leandrew Koyanagi, MD  Relative Name and Phone Number:    Lorrie Gargan patient's son [phone]    Current Level of Care: Hospital Recommended Level of Care: [place] Prior Approval Number:    Date Approved/Denied:   PASRR Number: [id number]  Discharge Plan: SNF    Current Diagnoses: Patient Active Problem List   Diagnosis Date Noted  . 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 Hip arthritis 02/23/2015    Orientation ACTIVITIES/SOCIAL BLADDER RESPIRATION    Self, Time, Situation, Place    Continent O2 (As needed) (3 L per minute)  BEHAVIORAL SYMPTOMS/MOOD NEUROLOGICAL BOWEL NUTRITION STATUS      Continent Diet (Carb Modified)  PHYSICIAN VISITS COMMUNICATION OF NEEDS Height & Weight Skin    Verbally   207 lbs. Surgical wounds          AMBULATORY STATUS RESPIRATION      O2 (As needed) (3 L per minute)      Personal Care Assistance Level of Assistance  Dressing, Bathing Bathing Assistance: Limited assistance   Dressing Assistance: Limited assistance      Functional Limitations Info                SPECIAL CARE FACTORS FREQUENCY                      Additional Factors Info  Code Status, Allergies Code Status Info: Full Code Allergies Info: ETHER           Current Medications (02/24/2015): Current Facility-Administered Medications  Medication Dose Route Frequency Provider Last Rate Last Dose  . 0.9 %  sodium chloride infusion   Intravenous Continuous Leandrew Koyanagi, MD 10 mL/hr at 02/24/15 0925 10 mL/hr at 02/24/15 0925  .  acetaminophen (TYLENOL) tablet 650 mg  650 mg Oral Q6H PRN Naiping Ephriam Jenkins, MD       Or  . acetaminophen (TYLENOL) suppository 650 mg  650 mg Rectal Q6H PRN Naiping Ephriam Jenkins, MD      . alum & mag hydroxide-simeth (MAALOX/MYLANTA) 200-200-20 MG/5ML suspension 30 mL  30 mL Oral Q4H PRN Naiping Ephriam Jenkins, MD      . ampicillin-sulbactam (UNASYN) 1.5 g in sodium chloride 0.9 % 50 mL IVPB  1.5 g Intravenous Q6H Lauren D Bajbus, RPH   1.5 g at 02/24/15 0924  . aspirin EC tablet 325 mg  325 mg Oral BID Leandrew Koyanagi, MD   325 mg at 02/24/15 1731  . diphenhydrAMINE (BENADRYL) 12.5 MG/5ML liquid 25 mg  25 mg Oral Q4H PRN Leandrew Koyanagi, MD      . fenofibrate tablet 160 mg  160 mg Oral Daily Naiping Ephriam Jenkins, MD   160 mg at 02/24/15 0925  . irbesartan (AVAPRO) tablet 300 mg  300 mg Oral Daily Leandrew Koyanagi, MD   300 mg at 02/24/15 3220   And  . hydrochlorothiazide (HYDRODIURIL) tablet 25 mg  25 mg Oral Daily Leandrew Koyanagi, MD   25 mg at 02/24/15 0925  . insulin aspart (novoLOG) injection 0-20 Units  0-20 Units Subcutaneous  TID WC Naiping Ephriam Jenkins, MD   7 Units at 02/24/15 1732  . insulin aspart (novoLOG) injection 0-5 Units  0-5 Units Subcutaneous QHS Leandrew Koyanagi, MD   2 Units at 02/23/15 2202  . levothyroxine (SYNTHROID, LEVOTHROID) tablet 50 mcg  50 mcg Oral QPM Naiping Ephriam Jenkins, MD   50 mcg at 02/24/15 1731  . linagliptin (TRADJENTA) tablet 5 mg  5 mg Oral Daily Naiping Ephriam Jenkins, MD   5 mg at 02/24/15 0925  . magnesium citrate solution 1 Bottle  1 Bottle Oral Once PRN Naiping Ephriam Jenkins, MD      . menthol-cetylpyridinium (CEPACOL) lozenge 3 mg  1 lozenge Oral PRN Naiping Ephriam Jenkins, MD       Or  . phenol (CHLORASEPTIC) mouth spray 1 spray  1 spray Mouth/Throat PRN Leandrew Koyanagi, MD      . metFORMIN (GLUCOPHAGE) tablet 1,000 mg  1,000 mg Oral BID WC Naiping Ephriam Jenkins, MD   1,000 mg at 02/24/15 1731  . methocarbamol (ROBAXIN) tablet 500 mg  500 mg Oral Q6H PRN Leandrew Koyanagi, MD   500 mg at 02/24/15 1117   Or  . methocarbamol (ROBAXIN) 500 mg in dextrose  5 % 50 mL IVPB  500 mg Intravenous Q6H PRN Naiping Ephriam Jenkins, MD      . metoCLOPramide (REGLAN) tablet 5-10 mg  5-10 mg Oral Q8H PRN Naiping Ephriam Jenkins, MD       Or  . metoCLOPramide (REGLAN) injection 5-10 mg  5-10 mg Intravenous Q8H PRN Naiping Ephriam Jenkins, MD      . morphine 2 MG/ML injection 2 mg  2 mg Intravenous Q2H PRN Leandrew Koyanagi, MD   2 mg at 02/24/15 1117  . multivitamin with minerals tablet 1 tablet  1 tablet Oral Daily Naiping Ephriam Jenkins, MD   1 tablet at 02/24/15 0925  . ondansetron (ZOFRAN) tablet 4 mg  4 mg Oral Q6H PRN Naiping Ephriam Jenkins, MD       Or  . ondansetron [facility]) injection 4 mg  4 mg Intravenous Q6H PRN Naiping Ephriam Jenkins, MD      . oxyCODONE (Oxy IR/ROXICODONE) immediate release tablet 5-10 mg  5-10 mg Oral Q3H PRN Leandrew Koyanagi, MD   10 mg at 02/24/15 1739  . oxyCODONE (OXYCONTIN) 12 hr tablet 10 mg  10 mg Oral Q12H Naiping Ephriam Jenkins, MD   10 mg at 02/24/15 0925  . polyethylene glycol (MIRALAX / GLYCOLAX) packet 17 g  17 g Oral Daily PRN Naiping Ephriam Jenkins, MD      . sorbitol 70 % solution 30 mL  30 mL Oral Daily PRN Naiping Ephriam Jenkins, MD      . vitamin C (ASCORBIC ACID) tablet 500 mg  500 mg Oral Daily Naiping Ephriam Jenkins, MD   500 mg at 02/24/15 9509   Do not use this list as official medication orders. Please verify with discharge summary.  Discharge Medications:   Medication List    STOP taking these medications        aspirin 81 MG tablet  Replaced by:  aspirin EC 325 MG tablet      TAKE these medications        aspirin EC 325 MG tablet  Take 1 tablet (325 mg total) by mouth 2 (two) times daily.     methocarbamol 750 MG tablet  Commonly known as:  ROBAXIN  Take 1 tablet (750 mg total) by mouth 2 (two) times daily  as needed for muscle spasms.     oxyCODONE 5 MG immediate release tablet  Commonly known as:  Oxy IR/ROXICODONE  Take 1-3 tablets (5-15 mg total) by mouth every 4 (four) hours as needed.     oxyCODONE 10 mg 12 hr tablet  Commonly known as:  OXYCONTIN  Take 1 tablet (10 mg total) by mouth  every 12 (twelve) hours.     senna-docusate 8.6-50 MG tablet  Commonly known as:  SENOKOT S  Take 1 tablet by mouth at bedtime as needed.      ASK your doctor about these medications        CALCIUM 600 + D PO  Take 1 tablet by mouth daily.     fenofibrate 160 MG tablet  Take 160 mg by mouth daily.     HYDROcodone-acetaminophen 7.5-325 MG tablet  Commonly known as:  NORCO  Take 1 tablet by mouth every 6 (six) hours as needed for moderate pain.     levothyroxine 50 MCG tablet  Commonly known as:  SYNTHROID, LEVOTHROID  Take 50 mcg by mouth every evening.     metFORMIN 1000 MG tablet  Commonly known as:  GLUCOPHAGE  Take 1 tablet by mouth 2 (two) times daily.     multivitamin with minerals tablet  Take 1 tablet by mouth daily.     saxagliptin HCl 2.5 MG Tabs tablet  Commonly known as:  ONGLYZA  Take 2.5 mg by mouth daily.     telmisartan-hydrochlorothiazide 80-25 MG tablet  Commonly known as:  MICARDIS HCT  Take 1 tablet by mouth daily.     vitamin C 500 MG tablet  Commonly known as:  ASCORBIC ACID  Take 500 mg by mouth daily.        Relevant Imaging Results:  Relevant Lab Results:  Recent Labs    Additional Information    Eloise Mula, Jones Broom, LCSWA

## 2015-02-24 NOTE — Clinical Social Work Note (Signed)
Clinical Social Work Assessment  Patient Details  Name: Cassandra Dodson MRN: [id number] Date of Birth: 12/15/1940  Date of referral:  02/24/15               Reason for consult:  Facility Placement                Permission sought to share information with:  Family Supports, Customer service manager Permission granted to share information::  Yes, Verbal Permission Granted  Name::     Cassandra Dodson patient's son [phone]  Agency::  [facility] admissions  Relationship::     Contact Information:     Housing/Transportation Living arrangements for the past 2 months:  [place] of Information:  Patient Patient Interpreter Needed:  None Criminal Activity/Legal Involvement Pertinent to Current Situation/Hospitalization:  No - Comment as needed Significant Relationships:  Adult Children Lives with:  Adult Children Do you feel safe going back to the place where you live?  Yes (Patient expresses wants she has received some therapy then she can return home.) Need for family participation in patient care:  No (Coment)  Care giving concerns:  Patient feels she needs some short term rehab in order to return back home.   Social Worker assessment / plan:  Patient is a 74 year old female who lives with her son.  Patient is alert and oriented x4 and talkative, patient expresses she has never been to rehab before CSW explained to her the process and what to expect at facility.  Patient has preregistered at [facility] which is the SNF that she is planning to go to for rehab.  Patient said she wanted to go home, but the doctor said she needs to get some therapy first before returning home.  Patient expressed she did not have any concerns or questions about SNF for rehab.  Employment status:  Retired Health visitor, Managed Care PT Recommendations:  [place] / Referral to community resources:     Patient/Family's Response to care:   Patient in agreement to going to SNF for short term rehab.  Patient/Family's Understanding of and Emotional Response to Diagnosis, Current Treatment, and Prognosis:  Patient aware of current prognosis and treatment plan.    Emotional Assessment Appearance:  Appears stated age Attitude/Demeanor/Rapport:    Affect (typically observed):  Calm, Appropriate, Pleasant Orientation:  Oriented to Self, Oriented to Place, Oriented to  Time, Oriented to Situation Alcohol / Substance use:  Not Applicable Psych involvement (Current and /or in the community):  No (Comment)  Discharge Needs  Concerns to be addressed:  No discharge needs identified Readmission within the last 30 days:  No Current discharge risk:  None Barriers to Discharge:  No Barriers Identified   Ross Ludwig, LCSWA 02/24/2015, 6:09 PM

## 2015-02-24 NOTE — Progress Notes (Signed)
Pt antibiotics still unavailable from pharmacy; notified and called x2; reported off to oncoming RN to give when med become available. Delia Heady RN

## 2015-02-24 NOTE — Evaluation (Signed)
Occupational Therapy Evaluation Patient Details Name: Cassandra Dodson MRN: [id number] DOB: November 19, 1940 Today's Date: 02/24/2015    History of Present Illness Pt s/p direct anterior rt THA. PMH - rt TKA, HTN, DM   Clinical Impression   Pt admitted with the above diagnosis and has the deficits listed below. Pt would benefit from cont OT to increase her independence with basic adls and functional mobility so she can eventually d/c home with her son and be alone during the day while he works.  Pt was prearranged to go to [facility] and feel this is appropriate as she is not safe to be alone at home at this point but should be mod I after receiving rehab.    Follow Up Recommendations  SNF;Supervision/Assistance - 24 hour    Equipment Recommendations  3 in 1 bedside comode;Other (comment) (reacher and sock aid.)    Recommendations for Other Services       Precautions / Restrictions Precautions Precautions: Fall Precaution Comments: of walking less than 20 feet could do +1 Restrictions Weight Bearing Restrictions: Yes RLE Weight Bearing: Weight bearing as tolerated      Mobility Bed Mobility Overal bed mobility: Needs Assistance Bed Mobility: Supine to Sit;Sit to Supine     Supine to sit: Min assist Sit to supine: Min assist   General bed mobility comments: Assist to bring RLE off bed and to bring RLE back up into bed.  Transfers Overall transfer level: Needs assistance Equipment used: Rolling walker (2 wheeled) Transfers: Sit to/from [facility] Sit to Stand: Min guard Stand pivot transfers: Min guard       General transfer comment: Verbal cues for hand placement and assist to bring hips up.    Balance Overall balance assessment: Needs assistance Sitting-balance support: Feet supported Sitting balance-Leahy Scale: Good     Standing balance support: Bilateral upper extremity supported;During functional activity Standing balance-Leahy Scale:  Poor Standing balance comment: Pt must have walker to stand and after a few minutes on her feet she fatigues quickly and has to sit with short warning.                            ADL Overall ADL's : Needs assistance/impaired Eating/Feeding: Independent;Sitting   Grooming: Wash/dry hands;Wash/dry face;Oral care;Supervision/safety;Sitting   Upper Body Bathing: Set up;Sitting   Lower Body Bathing: Moderate assistance;Sit to/from stand   Upper Body Dressing : Set up;Sitting   Lower Body Dressing: Moderate assistance;Sit to/from stand Lower Body Dressing Details (indicate cue type and reason): assist to start pants over feet and to donn socks and shoes. Toilet Transfer: Minimal Production assistant, radio Details (indicate cue type and reason): min assist for safe sitting techniques.  pt falls back into chair and onto bed. Toileting- Clothing Manipulation and Hygiene: Moderate assistance;Sit to/from stand Toileting - Clothing Manipulation Details (indicate cue type and reason): mod assist to reach when cleaning self.     Functional mobility during ADLs: Minimal assistance;Rolling walker General ADL Comments: Pt needs assist with LE adls.  Pt will benefit from being introduced to reacher and sock aid  for LE adls.     Vision Vision Assessment?: No apparent visual deficits   Perception     Praxis      Pertinent Vitals/Pain Pain Assessment: 0-10 Pain Score: 4  Pain Location: Rt hip Pain Descriptors / Indicators: Aching;Operative site guarding Pain Intervention(s): Limited activity within patient's tolerance;Monitored during session;Premedicated before session;Repositioned  Hand Dominance Right   Extremity/Trunk Assessment Upper Extremity Assessment Upper Extremity Assessment: Overall WFL for tasks assessed   Lower Extremity Assessment Lower Extremity Assessment: Defer to PT evaluation RLE Deficits / Details: Assist to move against gravity RLE:  Unable to fully assess due to pain   Cervical / Trunk Assessment Cervical / Trunk Assessment: Normal   Communication Communication Communication: No difficulties   Cognition Arousal/Alertness: Awake/alert Behavior During Therapy: WFL for tasks assessed/performed Overall Cognitive Status: Within Functional Limits for tasks assessed                     General Comments       Exercises       Shoulder Instructions      Home Living Family/patient expects to be discharged to:: Private residence Living Arrangements: Children Available Help at Discharge: Family;Available PRN/intermittently Type of Home: Apartment Home Access: Elevator     Home Layout: One level     Bathroom Shower/Tub: Tub/shower unit;Curtain Shower/tub characteristics: Door Biochemist, clinical: Standard     Home Equipment: Environmental consultant - 2 wheels;Cane - single point;Crutches          Prior Functioning/Environment Level of Independence: Needs assistance  Gait / Transfers Assistance Needed: modified independent with rolling walker ADL's / Homemaking Assistance Needed: son assisted with bathing and LE adls the last 2 months        OT Diagnosis: Generalized weakness;Acute pain   OT Problem List: Decreased strength;Decreased activity tolerance;Impaired balance (sitting and/or standing);Decreased knowledge of use of DME or AE;Pain;Obesity   OT Treatment/Interventions: Self-care/ADL training;Therapeutic activities;DME and/or AE instruction    OT Goals(Current goals can be found in the care plan section) Acute Rehab OT Goals Patient Stated Goal: Go to [facility] and then return home OT Goal Formulation: With patient Time For Goal Achievement: 03/10/15 Potential to Achieve Goals: Good ADL Goals Pt Will Perform Lower Body Bathing: with supervision;with adaptive equipment;sit to/from stand Pt Will Perform Lower Body Dressing: with supervision;with adaptive equipment;sit to/from stand Pt Will Perform  Tub/Shower Transfer: with supervision;shower seat;ambulating;rolling walker;Tub transfer Additional ADL Goal #1: Pt will transfer to 3:1 over commode and toilet all with S.  OT Frequency: Min 2X/week   Barriers to D/C: Decreased caregiver support  lives with children who work during the day.       Co-evaluation              End of Session Equipment Utilized During Treatment: Rolling walker;Oxygen Nurse Communication: Mobility status;Other (comment) (pt did urinate for first time since foley out.)  Activity Tolerance: Patient limited by fatigue Patient left: in bed;with call bell/phone within reach   Time: 0940-1000 OT Time Calculation (min): 20 min Charges:  OT General Charges $OT Visit: 1 Procedure OT Evaluation $Initial OT Evaluation Tier I: 1 Procedure G-Codes:    Glenford Peers 11-Mar-2015, 10:19 AM  [phone]

## 2015-02-24 NOTE — Progress Notes (Signed)
   Subjective:  Patient reports pain as mild.  Breathing and satting well on 3L [place] C/o right leg heaviness  Objective:   VITALS:   Filed Vitals:   02/23/15 1846 02/23/15 2057 02/24/15 0005 02/24/15 0603  BP: 144/66 168/70 117/51 144/65  Pulse: 99 118 114 108  Temp:  98.7 F (37.1 C) 100.1 F (37.8 C) 100.2 F (37.9 C)  TempSrc:  Oral Oral Oral  Resp: 16   15  Weight:      SpO2: 98% 100% 96% 98%    Neurologically intact ABD soft Neurovascular intact Sensation intact distally Intact pulses distally Dorsiflexion/Plantar flexion intact Incision: dressing C/D/I and no drainage No cellulitis present Compartment soft   Lab Results  Component Value Date   WBC 7.1 02/24/2015   HGB 10.7* 02/24/2015   HCT 32.8* 02/24/2015   MCV 90.4 02/24/2015   PLT 145* 02/24/2015     Assessment/Plan:  1 Day Post-Op   - Expected postop acute blood loss anemia - will monitor for symptoms - Up with PT/OT may need SNF - DVT ppx - SCDs, ambulation, asa - WBAT operative extremity - foley out this am - unasyn x 7 days for empiric rx of aspiration event intraop - Pain control - Discharge planning  Marianna Payment 02/24/2015, 7:38 AM [phone]

## 2015-02-24 NOTE — Addendum Note (Signed)
Addendum  created 02/24/15 0849 by Suzy Bouchard, CRNA   Modules edited: Notes Section   Notes Section:  File: [id number]

## 2015-02-24 NOTE — Progress Notes (Signed)
All belongings gathered along with chart and pt transferred to [phone]

## 2015-02-25 LAB — CBC
HCT: 30.5 % — ABNORMAL LOW (ref 36.0–46.0)
Hemoglobin: 9.9 g/dL — ABNORMAL LOW (ref 12.0–15.0)
MCH: 28.9 pg (ref 26.0–34.0)
MCHC: 32.5 g/dL (ref 30.0–36.0)
MCV: 88.9 fL (ref 78.0–100.0)
PLATELETS: 155 10*3/uL (ref 150–400)
RBC: 3.43 MIL/uL — AB (ref 3.87–5.11)
RDW: 14.6 % (ref 11.5–15.5)
WBC: 7.5 10*3/uL (ref 4.0–10.5)

## 2015-02-25 LAB — GLUCOSE, CAPILLARY
GLUCOSE-CAPILLARY: 206 mg/dL — AB (ref 65–99)
GLUCOSE-CAPILLARY: 202 mg/dL — AB (ref 65–99)
Glucose-Capillary: 265 mg/dL — ABNORMAL HIGH (ref 65–99)
Glucose-Capillary: 181 mg/dL — ABNORMAL HIGH (ref 65–99)

## 2015-02-25 MED ORDER — SODIUM CHLORIDE 0.9 % IV SOLN: 1.5000 g | Freq: Four times a day (QID) | INTRAVENOUS | Status: DC

## 2015-02-25 NOTE — Care Management Note (Addendum)
Case Management Note  Patient Details  Name: Cassandra Dodson MRN: [id number] Date of Birth: 1940/05/23  Subjective/Objective:                  Procedure(s) Performed: Procedure(s): RIGHT TOTAL HIP ARTHROPLASTY ANTERIOR APPROACH (Right)  Action/Plan: Discharge planning Expected Discharge Date:                  Expected Discharge Plan:  [facility]  In-House Referral:     Discharge planning Services  CM Consult  Post Acute Care Choice:    Choice offered to:     DME Arranged:    DME Agency:     HH Arranged:    [place] Agency:     Status of Service:  Completed, signed off  Medicare Important Message Given:    Date Medicare IM Given:    Medicare IM give by:    Date Additional Medicare IM Given:    Additional Medicare Important Message give by:     If discussed at [place] of Stay Meetings, dates discussed:    Additional Comments: Cm notes pt to go to SNF; CSW arranging.  No other CM needs were communicated. Dellie Catholic, RN 02/25/2015, 4:06 PM

## 2015-02-25 NOTE — Discharge Summary (Addendum)
Physician Discharge Summary      Patient ID: Cassandra Dodson MRN: [id number] DOB/AGE: [birth year] 74 y.o.  Admit date: 02/23/2015 Discharge date: 02/27/2015  Admission Diagnoses:  Right hip osteoarthritis  Discharge Diagnoses:  Active Problems:   Osteoarthritis of right hip   Hip arthritis   Past Medical History  Diagnosis Date  . PONV (postoperative nausea and vomiting)     severe nausea and vomiting after knee replacement  . Hypertension   . Diabetes mellitus without complication ([place])   . Depression   . Urinary frequency   . GERD (gastroesophageal reflux disease)   . Arthritis   . Neuromuscular disorder (HCC)     neuropathy in feet  . Hypothyroidism   . CKD (chronic kidney disease), stage III     Surgeries: Procedure(s): RIGHT TOTAL HIP ARTHROPLASTY ANTERIOR APPROACH on 02/23/2015   Consultants (if any):    Discharged Condition: Improved  Hospital Course: Cassandra Dodson is an 74 y.o. female who was admitted 02/23/2015 with a diagnosis of right hip osteoarthritis and went to the operating room on 02/23/2015 and underwent the above named procedures.    She was given perioperative antibiotics:      Anti-infectives    Start     Dose/Rate Route Frequency Ordered Stop   02/25/15 0000  ampicillin-sulbactam 1.5 g in sodium chloride 0.9 % 50 mL     1.5 g 100 mL/hr over 30 Minutes Intravenous Every 6 hours 02/25/15 2026     02/24/15 0900  ampicillin-sulbactam (UNASYN) 1.5 g in sodium chloride 0.9 % 50 mL IVPB     1.5 g 100 mL/hr over 30 Minutes Intravenous Every 6 hours 02/24/15 0825 03/03/15 0559   02/23/15 2030  ceFAZolin (ANCEF) IVPB 2 g/50 mL premix     2 g 100 mL/hr over 30 Minutes Intravenous Every 6 hours 02/23/15 2009 02/24/15 0309   02/23/15 1200  ceFAZolin (ANCEF) IVPB 2 g/50 mL premix     2 g 100 mL/hr over 30 Minutes Intravenous To [facility] 02/22/15 1157 02/23/15 1345    .  She was given sequential compression devices, early ambulation, and aspirin  for DVT prophylaxis.  She benefited maximally from the hospital stay and there were no complications.    Recent vital signs:  Filed Vitals:   02/27/15 0621  BP: 162/80  Pulse: 93  Temp: 98.2 F (36.8 C)  Resp: 18    Recent laboratory studies:  Lab Results  Component Value Date   HGB 9.3* 02/26/2015   HGB 9.9* 02/25/2015   HGB 10.7* 02/24/2015   Lab Results  Component Value Date   WBC 7.2 02/26/2015   PLT 149* 02/26/2015   Lab Results  Component Value Date   INR 1.12 02/13/2015   Lab Results  Component Value Date   NA 135 02/24/2015   K 4.1 02/24/2015   CL 99* 02/24/2015   CO2 26 02/24/2015   BUN 30* 02/24/2015   CREATININE 1.48* 02/24/2015   GLUCOSE 219* 02/24/2015    Discharge Medications:     Medication List    STOP taking these medications        aspirin 81 MG tablet  Replaced by:  aspirin EC 325 MG tablet      TAKE these medications        ampicillin-sulbactam 1.5 g in sodium chloride 0.9 % 50 mL  Inject 1.5 g into the vein every 6 (six) hours.     aspirin EC 325 MG tablet  Take 1 tablet (  325 mg total) by mouth 2 (two) times daily.     CALCIUM 600 + D PO  Take 1 tablet by mouth daily.     fenofibrate 160 MG tablet  Take 160 mg by mouth daily.     HYDROcodone-acetaminophen 7.5-325 MG tablet  Commonly known as:  NORCO  Take 1 tablet by mouth every 6 (six) hours as needed for moderate pain.     levothyroxine 50 MCG tablet  Commonly known as:  SYNTHROID, LEVOTHROID  Take 50 mcg by mouth every evening.     metFORMIN 1000 MG tablet  Commonly known as:  GLUCOPHAGE  Take 1 tablet by mouth 2 (two) times daily.     methocarbamol 750 MG tablet  Commonly known as:  ROBAXIN  Take 1 tablet (750 mg total) by mouth 2 (two) times daily as needed for muscle spasms.     multivitamin with minerals tablet  Take 1 tablet by mouth daily.     oxyCODONE 5 MG immediate release tablet  Commonly known as:  Oxy IR/ROXICODONE  Take 1-3 tablets (5-15 mg  total) by mouth every 4 (four) hours as needed.     oxyCODONE 10 mg 12 hr tablet  Commonly known as:  OXYCONTIN  Take 1 tablet (10 mg total) by mouth every 12 (twelve) hours.     saxagliptin HCl 2.5 MG Tabs tablet  Commonly known as:  ONGLYZA  Take 2.5 mg by mouth daily.     senna-docusate 8.6-50 MG tablet  Commonly known as:  SENOKOT S  Take 1 tablet by mouth at bedtime as needed.     telmisartan-hydrochlorothiazide 80-25 MG tablet  Commonly known as:  MICARDIS HCT  Take 1 tablet by mouth daily.     vitamin C 500 MG tablet  Commonly known as:  ASCORBIC ACID  Take 500 mg by mouth daily.        Diagnostic Studies: Dg Chest 1 View  02/23/2015  CLINICAL DATA:  74 year old female with vomiting and possible aspiration following surgery EXAM: CHEST 1 VIEW COMPARISON:  Prior chest x-ray 07/26/2003 FINDINGS: Slightly low inspiratory volumes. No focal airspace opacity, pleural effusion, pulmonary edema or pneumothorax. Cardiac and mediastinal contours are within normal limits. No acute osseous abnormality. IMPRESSION: Slightly low inspiratory volumes. Otherwise, negative chest x-ray. Electronically Signed   By: Jacqulynn Cadet M.D.   On: 02/23/2015 17:00   Dg Pelvis Portable  02/23/2015  CLINICAL DATA:  74 year old female status post right hip arthroplasty EXAM: PORTABLE PELVIS 1-2 VIEWS COMPARISON:  Intraoperative radiographs obtained earlier today FINDINGS: Surgical changes of right hip arthroplasty. No evidence of immediate hardware complication. Expected postoperative subcutaneous emphysema. The visualized bony pelvis is intact. Trace atherosclerotic calcifications present overlying both superficial femoral arteries. IMPRESSION: Right hip arthroplasty without evidence of acute complication. Atherosclerotic vascular calcifications. Electronically Signed   By: Jacqulynn Cadet M.D.   On: 02/23/2015 16:59   Dg Hip Operative Unilat With Pelvis Right  02/23/2015  CLINICAL DATA:  Anterior  right total hip arthroplasty for osteoarthritis EXAM: OPERATIVE RIGHT HIP (WITH PELVIS IF PERFORMED) fluoroscopic VIEWS TECHNIQUE: Fluoroscopic spot image(s) were submitted for interpretation post-operatively. COMPARISON:  None. FINDINGS: Fluoroscopy time 34 seconds. Four spot fluoroscopic nondiagnostic intraoperative radiographs of the right hip were provided, which demonstrate postsurgical changes from right total hip arthroplasty. IMPRESSION: Intraoperative fluoroscopic guidance for right total hip arthroplasty. Electronically Signed   By: Ilona Sorrel M.D.   On: 02/23/2015 15:32    Disposition: Patient needs to be on unasyn 1.5 g  q6h x 5 more days for aspiration pneumonia.  May pull PICC line once course of unasyn is completed.  Discharge Instructions    Call MD / Call 911    Complete by:  As directed   If you experience chest pain or shortness of breath, CALL 911 and be transported to the hospital emergency room.  If you develope a fever above 101.5 F, pus (white drainage) or increased drainage or redness at the wound, or calf pain, call your surgeon's office.     Constipation Prevention    Complete by:  As directed   Drink plenty of fluids.  Prune juice may be helpful.  You may use a stool softener, such as Colace (over the counter) 100 mg twice a day.  Use MiraLax (over the counter) for constipation as needed.     Diet - low sodium heart healthy    Complete by:  As directed      Diet general    Complete by:  As directed      Driving restrictions    Complete by:  As directed   No driving while taking narcotic pain meds.     Increase activity slowly as tolerated    Complete by:  As directed          Patient needs to be on unasyn 1.5 g q6h x 5 more days for aspiration pneumonia.    Follow-up Information    Follow up with Marianna Payment, MD In 2 weeks.   Specialty:  Orthopedic Surgery   Why:  For suture removal, For wound re-check   Contact information:   [place] [id number] [phone]       Follow up with [facility] .   Specialty:  Skilled Nursing Facility   Contact information:   [place] [phone]       Signed: Marianna Payment 02/27/2015, 7:01 AM

## 2015-02-25 NOTE — Progress Notes (Signed)
Physical Therapy Treatment Patient Details Name: Cassandra Dodson MRN: [id number] DOB: 1940-07-29 Today's Date: 02/25/2015    History of Present Illness Pt s/p direct anterior rt THA. PMH - rt TKA, HTN, DM    PT Comments    Pt was able to be convinced to stay OOB in chair, did express concern that she did not know how to get back to bed.  Repetitive instruction to pt to ask nursing and she literally has 4 call lights, phone in touch distance.  Pt is possibly a bit confused but is planning on SNF stay to rehab briefly on her way home.  Follow Up Recommendations  SNF     Equipment Recommendations  None recommended by PT    Recommendations for Other Services       Precautions / Restrictions Precautions Precautions: Fall Restrictions Weight Bearing Restrictions: Yes RLE Weight Bearing: Weight bearing as tolerated    Mobility  Bed Mobility Overal bed mobility: Needs Assistance Bed Mobility: Supine to Sit     Supine to sit: Min assist;HOB elevated     General bed mobility comments: mainly assisted to move RLE to side of bed and pt handled therest with small support under trunk  Transfers Overall transfer level: Needs assistance Equipment used: Rolling walker (2 wheeled) Transfers: Sit to/from [facility] Sit to Stand: Min assist Stand pivot transfers: Min guard;Min assist       General transfer comment: reminded hand placement for 100% of trials  Ambulation/Gait Ambulation/Gait assistance: Min guard;Min assist (dense cues for safety and sequence) Ambulation Distance (Feet): 16 Feet Assistive device: Rolling walker (2 wheeled) Gait Pattern/deviations: Step-to pattern;Step-through pattern;Antalgic;Trunk flexed;Wide base of support Gait velocity: decreased Gait velocity interpretation: Below normal speed for age/gender General Gait Details: reminders for keeping walker close and not impulsively trying to sit on chair   Stairs             Wheelchair Mobility    Modified Rankin (Stroke Patients Only)       Balance Overall balance assessment: Needs assistance Sitting-balance support: Feet supported Sitting balance-Leahy Scale: Good     Standing balance support: Bilateral upper extremity supported Standing balance-Leahy Scale: Poor Standing balance comment: depending on walker and frequent cues from PT                    Cognition Arousal/Alertness: Awake/alert Behavior During Therapy: WFL for tasks assessed/performed Overall Cognitive Status: Within Functional Limits for tasks assessed                      Exercises      General Comments General comments (skin integrity, edema, etc.): Pt is having some struggle to be up and moving but speaks specifically about wanting to get better.        Pertinent Vitals/Pain Pain Assessment: Faces Faces Pain Scale: Hurts even more Pain Location: R hip Pain Descriptors / Indicators: Operative site guarding;Aching Pain Intervention(s): Monitored during session;Premedicated before session;Repositioned    Home Living                      Prior Function            PT Goals (current goals can now be found in the care plan section) Acute Rehab PT Goals Patient Stated Goal: Go to [facility] and then return home Progress towards PT goals: Progressing toward goals    Frequency  7X/week    PT Plan Current plan remains  appropriate    Co-evaluation             End of Session Equipment Utilized During Treatment: Oxygen;Gait belt Activity Tolerance: Patient limited by fatigue;Patient limited by pain Patient left: in chair;with call bell/phone within reach     Time: 1115-1140 PT Time Calculation (min) (ACUTE ONLY): 25 min  Charges:  $Gait Training: 8-22 mins $Therapeutic Activity: 8-22 mins                    G Codes:      Ramond Dial March 18, 2015, 12:20 PM   Mee Hives, PT MS Acute Rehab Dept. Number: [phone] [id number] and [employer]  [phone]

## 2015-02-25 NOTE — Progress Notes (Signed)
   Subjective:  Patient reports pain as mild.  Breathing and satting well on 3L [place]   Objective:   VITALS:   Filed Vitals:   02/24/15 1339 02/24/15 2045 02/25/15 0430 02/25/15 0812  BP: 134/52 141/66 148/80 126/56  Pulse: 106 85 110 113  Temp: 99 F (37.2 C) 98.8 F (37.1 C) 99.5 F (37.5 C) 99.2 F (37.3 C)  TempSrc: Oral Oral Oral Oral  Resp: 19 18 18 13   Weight:      SpO2: 96% 97% 100% 94%    Neurologically intact ABD soft Neurovascular intact Sensation intact distally Intact pulses distally Dorsiflexion/Plantar flexion intact Incision: dressing C/D/I and no drainage No cellulitis present Compartment soft   Lab Results  Component Value Date   WBC 7.5 02/25/2015   HGB 9.9* 02/25/2015   HCT 30.5* 02/25/2015   MCV 88.9 02/25/2015   PLT 155 02/25/2015     Assessment/Plan:  2 Days Post-Op   - Expected postop acute blood loss anemia - will monitor for symptoms - Up with PT/OT - DVT ppx - SCDs, ambulation, asa - WBAT operative extremity - unasyn x 7 days for empiric rx of aspiration event intraop - Pain control - Discharge planning - SNF tomorrow - FL2 signed  Marianna Payment 02/25/2015, 9:37 AM [phone]

## 2015-02-26 LAB — GLUCOSE, CAPILLARY
GLUCOSE-CAPILLARY: 239 mg/dL — AB (ref 65–99)
Glucose-Capillary: 202 mg/dL — ABNORMAL HIGH (ref 65–99)
Glucose-Capillary: 168 mg/dL — ABNORMAL HIGH (ref 65–99)

## 2015-02-26 LAB — CBC
HCT: 28.7 % — ABNORMAL LOW (ref 36.0–46.0)
Hemoglobin: 9.3 g/dL — ABNORMAL LOW (ref 12.0–15.0)
MCH: 28.8 pg (ref 26.0–34.0)
MCHC: 32.4 g/dL (ref 30.0–36.0)
MCV: 88.9 fL (ref 78.0–100.0)
Platelets: 149 10*3/uL — ABNORMAL LOW (ref 150–400)
RBC: 3.23 MIL/uL — ABNORMAL LOW (ref 3.87–5.11)
RDW: 14.5 % (ref 11.5–15.5)
WBC: 7.2 10*3/uL (ref 4.0–10.5)

## 2015-02-26 MED ORDER — SODIUM CHLORIDE 0.9 % IJ SOLN: 10.0000 mL | INTRAMUSCULAR | Status: DC | PRN

## 2015-02-26 MED ORDER — SODIUM CHLORIDE 0.9 % IJ SOLN: 10.0000 mL | Freq: Two times a day (BID) | INTRAMUSCULAR | Status: DC

## 2015-02-26 NOTE — Progress Notes (Signed)
Patient ID: Cassandra Dodson, female   DOB: 1940-05-19, 74 y.o.   MRN: [id number] Plan for discharge to [facility]. Patient is still on Unasyn for aspiration pneumonia. Anticipate discharge on Monday. Patient is comfortable this morning without complaints.

## 2015-02-26 NOTE — Progress Notes (Signed)
Peripherally Inserted Central Catheter/Midline Placement  The IV Nurse has discussed with the patient and/or persons authorized to consent for the patient, the purpose of this procedure and the potential benefits and risks involved with this procedure.  The benefits include less needle sticks, lab draws from the catheter and patient may be discharged home with the catheter.  Risks include, but not limited to, infection, bleeding, blood clot (thrombus formation), and puncture of an artery; nerve damage and irregular heat beat.  Alternatives to this procedure were also discussed.  PICC/Midline Placement Documentation  PICC / Midline Single Lumen 23/34/35 PICC Right Basilic 38 cm 0 cm (Active)  Indication for Insertion or Continuance of Line Home intravenous therapies (PICC only) 02/26/2015  4:46 PM  Exposed Catheter (cm) 0 cm 02/26/2015  4:46 PM  Site Assessment Clean;Dry;Intact 02/26/2015  4:46 PM  Line Status Flushed;Saline locked;Blood return noted 02/26/2015  4:46 PM  Dressing Type Transparent 02/26/2015  4:46 PM  Dressing Status Clean;Dry;Intact 02/26/2015  4:46 PM  Dressing Change Due 03/05/15 02/26/2015  4:46 PM       Gordan Payment 02/26/2015, 4:47 PM

## 2015-02-26 NOTE — Progress Notes (Signed)
Physical Therapy Treatment Patient Details Name: Cassandra Dodson MRN: [id number] DOB: 10/20/1940 Today's Date: 02/26/2015    History of Present Illness Pt s/p direct anterior rt THA. PMH - rt TKA, HTN, DM    PT Comments    Pt is demonstrating better standing control although gait control issues of walker and avoiding obstacles do exist.  Her plan is still to go to SNF tomorrow, with focus on standing and balance/gait until then.  Follows instructions for there ex well, improved endurance and control today.  Follow Up Recommendations  SNF     Equipment Recommendations  None recommended by PT    Recommendations for Other Services       Precautions / Restrictions Precautions Precautions: Fall Restrictions Weight Bearing Restrictions: Yes RLE Weight Bearing: Weight bearing as tolerated    Mobility  Bed Mobility Overal bed mobility: Needs Assistance Bed Mobility: Supine to Sit     Supine to sit: Min assist;HOB elevated     General bed mobility comments: moved RLE and minor assist under trunk  Transfers Overall transfer level: Needs assistance Equipment used: Rolling walker (2 wheeled);1 person hand held assist (assisted with lines) Transfers: Sit to/from [facility] Sit to Stand: Min assist Stand pivot transfers: Min guard;Min assist       General transfer comment: reminded hand placement for 100% of trials  Ambulation/Gait Ambulation/Gait assistance: Min guard;Min assist Ambulation Distance (Feet): 18 Feet Assistive device: Rolling walker (2 wheeled) Gait Pattern/deviations: Decreased step length - left;Decreased step length - right;Step-to pattern;Antalgic;Wide base of support (walks too close to front of walker) Gait velocity: decreased Gait velocity interpretation: Below normal speed for age/gender General Gait Details: safety and directional prompts   Stairs            Wheelchair Mobility    Modified Rankin (Stroke Patients Only)        Balance Overall balance assessment: Needs assistance Sitting-balance support: Feet supported Sitting balance-Leahy Scale: Good     Standing balance support: Bilateral upper extremity supported Standing balance-Leahy Scale: Fair Standing balance comment: better initial standiing control today                    Cognition Arousal/Alertness: Awake/alert Behavior During Therapy: WFL for tasks assessed/performed Overall Cognitive Status: Within Functional Limits for tasks assessed                      Exercises Total Joint Exercises Ankle Circles/Pumps: AROM;Both;5 reps Long Arc Quad: Strengthening;Both;15 reps Knee Flexion: Strengthening;Both;15 reps    General Comments General comments (skin integrity, edema, etc.): Has some difficulty sequencing her transition to bedside,  to stand and to walk but is motivated and giving a better effort.  Some of her issue is cognitive and hopefully will clear as she is at SNF      Pertinent Vitals/Pain Pain Assessment: 0-10 Pain Score: 4  Pain Location: R hip Pain Descriptors / Indicators: Cramping;Aching Pain Intervention(s): Monitored during session;Premedicated before session;Repositioned    Home Living                      Prior Function            PT Goals (current goals can now be found in the care plan section) Acute Rehab PT Goals Patient Stated Goal: Go to [facility] and then return home Progress towards PT goals: Progressing toward goals    Frequency  7X/week    PT Plan Current  plan remains appropriate    Co-evaluation             End of Session Equipment Utilized During Treatment: Oxygen;Gait belt Activity Tolerance: Patient limited by fatigue;Patient limited by pain Patient left: in chair;with call bell/phone within reach     Time: 1155-1223 PT Time Calculation (min) (ACUTE ONLY): 28 min  Charges:  $Gait Training: 8-22 mins $Therapeutic Exercise: 8-22 mins                     G Codes:      Ramond Dial 03/21/2015, 3:36 PM   Mee Hives, PT MS Acute Rehab Dept. Number: [facility] [id number] and [place] [phone]

## 2015-02-26 NOTE — Progress Notes (Signed)
Physical Therapy Treatment Patient Details Name: Cassandra Dodson MRN: [id number] DOB: 1940-09-30 Today's Date: 02/26/2015    History of Present Illness Pt s/p direct anterior rt THA. PMH - rt TKA, HTN, DM    PT Comments    Pt is getting up to walk with assistance at bedside and was upset her nurse had her help so much.  Pt is somewhat confused and not aware of her goals of therapy.  Did talk with her about PT and nursing letting her do a bit more now, that the purpose of this was to increase independence toward getting better/home.  Follow Up Recommendations  SNF     Equipment Recommendations  None recommended by PT    Recommendations for Other Services       Precautions / Restrictions Precautions Precautions: Fall Restrictions Weight Bearing Restrictions: Yes RLE Weight Bearing: Weight bearing as tolerated    Mobility  Bed Mobility Overal bed mobility: Needs Assistance Bed Mobility: Supine to Sit     Supine to sit: Min assist;HOB elevated     General bed mobility comments: moved RLE and minor assist under trunk  Transfers Overall transfer level: Needs assistance Equipment used: Rolling walker (2 wheeled);1 person hand held assist (assisted with lines) Transfers: Sit to/from [facility] Sit to Stand: Min assist;Min guard Stand pivot transfers: Min guard       General transfer comment: reminded hand placement for 100% of trials  Ambulation/Gait Ambulation/Gait assistance: Min guard Ambulation Distance (Feet): 3 Feet Assistive device: Rolling walker (2 wheeled) Gait Pattern/deviations: Step-through pattern;Trunk flexed;Narrow base of support Gait velocity: decreased Gait velocity interpretation: Below normal speed for age/gender General Gait Details: safety and directional prompts   Stairs            Wheelchair Mobility    Modified Rankin (Stroke Patients Only)       Balance Overall balance assessment: Needs  assistance Sitting-balance support: Feet supported Sitting balance-Leahy Scale: Good     Standing balance support: Bilateral upper extremity supported Standing balance-Leahy Scale: Fair Standing balance comment: better initial standiing control today                    Cognition Arousal/Alertness: Awake/alert Behavior During Therapy: WFL for tasks assessed/performed Overall Cognitive Status: Within Functional Limits for tasks assessed                      Exercises Total Joint Exercises Ankle Circles/Pumps: AROM;Both;5 reps Quad Sets: AROM;Both;15 reps Hip ABduction/ADduction: AAROM;AROM;Both;15 reps Long Arc Quad: Strengthening;Both;15 reps Knee Flexion: Strengthening;Both;15 reps Bridges: AROM;5 reps    General Comments General comments (skin integrity, edema, etc.): Pt is forgetful about who has seen her and what they have said but overall is extending a good effort to move even when she is somewhat painful      Pertinent Vitals/Pain Pain Assessment: Faces Pain Score: 4  Faces Pain Scale: Hurts little more Pain Location: R hip Pain Descriptors / Indicators: Aching Pain Intervention(s): Monitored during session;Premedicated before session;Repositioned    Home Living                      Prior Function            PT Goals (current goals can now be found in the care plan section) Acute Rehab PT Goals Patient Stated Goal: Go to [facility] and then return home Progress towards PT goals: Progressing toward goals    Frequency  7X/week  PT Plan Current plan remains appropriate    Co-evaluation             End of Session Equipment Utilized During Treatment: Oxygen;Gait belt Activity Tolerance: Patient limited by fatigue Patient left: with call bell/phone within reach;in bed;with bed alarm set     Time: 1435-1458 PT Time Calculation (min) (ACUTE ONLY): 23 min  Charges:  $Gait Training: 8-22 mins $Therapeutic Exercise: 8-22  mins $Therapeutic Activity: 8-22 mins                    G Codes:      Ramond Dial March 08, 2015, 3:50 PM   Mee Hives, PT MS Acute Rehab Dept. Number: [facility] [id number] and [place] [phone]

## 2015-02-27 DIAGNOSIS — E1165 Type 2 diabetes mellitus with hyperglycemia: Secondary | ICD-10-CM | POA: Diagnosis not present

## 2015-02-27 DIAGNOSIS — N189 Chronic kidney disease, unspecified: Secondary | ICD-10-CM | POA: Diagnosis not present

## 2015-02-27 DIAGNOSIS — Z471 Aftercare following joint replacement surgery: Secondary | ICD-10-CM | POA: Diagnosis not present

## 2015-02-27 DIAGNOSIS — N183 Chronic kidney disease, stage 3 (moderate): Secondary | ICD-10-CM | POA: Diagnosis not present

## 2015-02-27 DIAGNOSIS — J690 Pneumonitis due to inhalation of food and vomit: Secondary | ICD-10-CM | POA: Diagnosis not present

## 2015-02-27 DIAGNOSIS — E785 Hyperlipidemia, unspecified: Secondary | ICD-10-CM | POA: Diagnosis not present

## 2015-02-27 DIAGNOSIS — I10 Essential (primary) hypertension: Secondary | ICD-10-CM | POA: Diagnosis not present

## 2015-02-27 DIAGNOSIS — E119 Type 2 diabetes mellitus without complications: Secondary | ICD-10-CM | POA: Diagnosis not present

## 2015-02-27 DIAGNOSIS — Z96641 Presence of right artificial hip joint: Secondary | ICD-10-CM | POA: Diagnosis not present

## 2015-02-27 DIAGNOSIS — M6281 Muscle weakness (generalized): Secondary | ICD-10-CM | POA: Diagnosis not present

## 2015-02-27 DIAGNOSIS — M25551 Pain in right hip: Secondary | ICD-10-CM | POA: Diagnosis not present

## 2015-02-27 DIAGNOSIS — F329 Major depressive disorder, single episode, unspecified: Secondary | ICD-10-CM | POA: Diagnosis not present

## 2015-02-27 DIAGNOSIS — D62 Acute posthemorrhagic anemia: Secondary | ICD-10-CM | POA: Diagnosis not present

## 2015-02-27 DIAGNOSIS — E1122 Type 2 diabetes mellitus with diabetic chronic kidney disease: Secondary | ICD-10-CM | POA: Diagnosis not present

## 2015-02-27 DIAGNOSIS — K210 Gastro-esophageal reflux disease with esophagitis: Secondary | ICD-10-CM | POA: Diagnosis not present

## 2015-02-27 DIAGNOSIS — M1611 Unilateral primary osteoarthritis, right hip: Secondary | ICD-10-CM | POA: Diagnosis not present

## 2015-02-27 DIAGNOSIS — E039 Hypothyroidism, unspecified: Secondary | ICD-10-CM | POA: Diagnosis not present

## 2015-02-27 DIAGNOSIS — R2681 Unsteadiness on feet: Secondary | ICD-10-CM | POA: Diagnosis not present

## 2015-02-27 DIAGNOSIS — R262 Difficulty in walking, not elsewhere classified: Secondary | ICD-10-CM | POA: Diagnosis not present

## 2015-02-27 DIAGNOSIS — K5900 Constipation, unspecified: Secondary | ICD-10-CM | POA: Diagnosis not present

## 2015-02-27 DIAGNOSIS — R600 Localized edema: Secondary | ICD-10-CM | POA: Diagnosis not present

## 2015-02-27 LAB — GLUCOSE, CAPILLARY
Glucose-Capillary: 169 mg/dL — ABNORMAL HIGH (ref 65–99)
Glucose-Capillary: 255 mg/dL — ABNORMAL HIGH (ref 65–99)

## 2015-02-27 NOTE — Clinical Social Work Note (Signed)
Patient to be d/c'ed today to [facility].  Patient and family agreeable to plans will transport via ems RN to call report.  Mete Purdum, MSW, LCSWA [phone]  

## 2015-02-27 NOTE — Progress Notes (Addendum)
Physical Therapy Progress Note  The pt was nauseous upon entry but was still able to do therapy.  She was able to ambulate without rest breaks.  Continues to need education on controlled descent to chair during sit <-> stand.     02/27/15 1400  PT Visit Information  Last PT Received On 02/27/15  Assistance Needed +1  History of Present Illness Pt s/p direct anterior rt THA. PMH - rt TKA, HTN, DM  PT Time Calculation  PT Start Time (ACUTE ONLY) 1327  PT Stop Time (ACUTE ONLY) 1357  PT Time Calculation (min) (ACUTE ONLY) 30 min  Subjective Data  Subjective I need to use the commode  Precautions  Precautions Fall  Restrictions  Weight Bearing Restrictions Yes  RLE Weight Bearing WBAT  Pain Assessment  Pain Assessment Faces  Faces Pain Scale 4  Pain Location R hip  Pain Intervention(s) Monitored during session;Limited activity within patient's tolerance  Cognition  Arousal/Alertness Awake/alert  Behavior During Therapy WFL for tasks assessed/performed  Overall Cognitive Status Within Functional Limits for tasks assessed  Bed Mobility  Overal bed mobility Needs Assistance  Bed Mobility Supine to Sit;Sit to Supine  Supine to sit Min guard  Sit to supine Min assist  General bed mobility comments HOB 20 degrees, with rails and increased time to rise from surface. Min assist to move legs back onto bed with return to bed. Pt declined OOB to chair  Transfers  Overall transfer level Needs assistance  Sit to Stand Min guard  Stand pivot transfers Min guard  General transfer comment verbal cues for hand placement and technique during transfer., particularly for controlled descent to surfaces. pt not reaching back and sits impulsively  Ambulation/Gait  Ambulation/Gait assistance Min guard  Ambulation Distance (Feet) 85 Feet  Assistive device Rolling walker (2 wheeled)  Gait Pattern/deviations Step-to pattern  General Gait Details verbal cues for posture.  Min guard for safety and  balance.  Vitals remained stable during ambulation (96 SpO2, 101 HR).  The pt. wore mesh panties/pad due to urinary frequency and fear of being out in hallway. Pt walked 20 ft and then rested, and then walked 60 more ft. before being pushed back to room in chair.    Gait velocity decreased  Gait velocity interpretation Below normal speed for age/gender  Balance  Overall balance assessment Needs assistance  Exercises  Exercises Total Joint  Total Joint Exercises  Hip ABduction/ADduction AROM;Both;10 reps;Standing  Long Arc Quad AROM;Right;10 reps;Seated  Marching in Standing Both;10 reps;Standing;AROM  PT - End of Session  Equipment Utilized During Treatment Gait belt  Activity Tolerance Patient tolerated treatment well  Patient left in bed;with call bell/phone within reach  Nurse Communication Mobility status  PT - Assessment/Plan  PT Plan Current plan remains appropriate  PT Frequency (ACUTE ONLY) 7X/week  Follow Up Recommendations SNF  PT equipment None recommended by PT  PT Goal Progression  Progress towards PT goals Progressing toward goals  PT General Charges  $$ ACUTE PT VISIT 1 Procedure  PT Treatments  $Gait Training 8-22 mins  $Therapeutic Exercise 8-22 mins  Heywood Footman, [place] - Office

## 2015-02-27 NOTE — Progress Notes (Signed)
ANTIBIOTIC CONSULT NOTE - Follow up   Pharmacy Consult for Unasyn Indication: aspiration PNA  Allergies  Allergen Reactions  . Ether     Patient Measurements: Weight: 207 lb (93.895 kg)   Vital Signs: Temp: 98.2 F (36.8 C) (11/07 0621) Temp Source: Oral (11/07 0621) BP: 162/80 mmHg (11/07 0621) Pulse Rate: 93 (11/07 1008) Intake/Output from previous day: 11/06 0701 - 11/07 0700 In: 460 [P.O.:360; IV Piggyback:100] Out: -  Intake/Output from this shift: Total I/O In: 240 [P.O.:240] Out: -   Labs:  Recent Labs  02/25/15 0459 02/26/15 0244  WBC 7.5 7.2  HGB 9.9* 9.3*  PLT 155 149*   CrCl cannot be calculated (Unknown ideal weight.). No results for input(s): VANCOTROUGH, VANCOPEAK, VANCORANDOM, GENTTROUGH, GENTPEAK, GENTRANDOM, TOBRATROUGH, TOBRAPEAK, TOBRARND, AMIKACINPEAK, AMIKACINTROU, AMIKACIN in the last 72 hours.   Microbiology: Recent Results (from the past 720 hour(s))  Surgical pcr screen     Status: None   Collection Time: 02/13/15  1:05 PM  Result Value Ref Range Status   MRSA, PCR NEGATIVE NEGATIVE Final   Staphylococcus aureus NEGATIVE NEGATIVE Final    Comment:        The Xpert SA Assay (FDA approved for NASAL specimens in patients over 28 years of age), is one component of a comprehensive surveillance program.  Test performance has been validated by [facility] for patients greater than or equal to 65 year old. It is not intended to diagnose infection nor to guide or monitor treatment.    Assessment: 74 YOF s/p total hip replacement who had aspiration event intra-op and is to start Unasyn x7 days for coverage. WBC 7.2, tmax/24h 98.2  SCr 1.48- she has history of CKDIII.   Goal of Therapy:  proper dosing based on renal and hepatic function  Plan:  - Unasyn 1.5g IV q6h x7 days (stop date 11/11) - follow c/s, clinical progression, renal function - follow for ability to change to PO Augmentin when able - noted plan to d/c on  Monday  Vincenza Hews, PharmD, BCPS 02/27/2015, 11:21 AM Pager: [phone]

## 2015-02-27 NOTE — Care Management Important Message (Signed)
Important Message  Patient Details  Name: Cassandra Dodson MRN: [id number] Date of Birth: 1941-01-12   Medicare Important Message Given:  Yes-second notification given    Loann Quill 02/27/2015, 1:43 PM

## 2015-02-27 NOTE — Progress Notes (Signed)
Physical Therapy Treatment Patient Details Name: Cassandra Dodson MRN: [id number] DOB: 1940-10-23 Today's Date: 02/27/2015    History of Present Illness Pt s/p direct anterior rt THA. PMH - rt TKA, HTN, DM    PT Comments    The pt was able to ambulate out of the room and walked a much longer distance than previous sessions.  HR remained stable during gait training (96% SpO2, 101 HR) although pt reported slight dizziness.  Pt performed all sitting exercises with no c/o increased pain.  Plan for D/C this afternoon.     Follow Up Recommendations  SNF     Equipment Recommendations  None recommended by PT       Precautions / Restrictions Precautions Precautions: Fall Restrictions Weight Bearing Restrictions: Yes RLE Weight Bearing: Weight bearing as tolerated    Mobility  Bed Mobility Overal bed mobility: Needs Assistance Bed Mobility: Supine to Sit     Supine to sit: HOB elevated;Supervision     General bed mobility comments: Pt. able to use bed rails and elevated HOB to bring trunk up and used LLE to move RLE to EOB.  Verbal cues for hand placement and technique.    Transfers Overall transfer level: Needs assistance Equipment used: Rolling walker (2 wheeled) (assisted with lines) Transfers: Sit to/from [facility] Sit to Stand: Min guard Stand pivot transfers: Min guard       General transfer comment: verbal cues for hand placement and technique during transfer.  Ambulation/Gait Ambulation/Gait assistance: Min guard (2nd person following with chair) Ambulation Distance (Feet): 80 Feet Assistive device: Rolling walker (2 wheeled) Gait Pattern/deviations: Step-to pattern;Antalgic Gait velocity: decreased Gait velocity interpretation: Below normal speed for age/gender General Gait Details: verbal cues for posture.  Min guard for safety and balance.  Vitals remained stable during ambulation (96 SpO2, 101 HR).  The pt. wore mesh panties/pad due to urinary  frequency and fear of being out in hallway. Pt walked 20 ft and then rested, and then walked 60 more ft. before being pushed back to room in chair.         Balance Overall balance assessment: Needs assistance Sitting-balance support: Bilateral upper extremity supported;Feet supported Sitting balance-Leahy Scale: Good     Standing balance support: Bilateral upper extremity supported Standing balance-Leahy Scale: Fair Standing balance comment: LOB x 2 when ambulating, patient able to correct.                      Cognition Arousal/Alertness: Awake/alert Behavior During Therapy: WFL for tasks assessed/performed Overall Cognitive Status: Within Functional Limits for tasks assessed                      Exercises Total Joint Exercises Ankle Circles/Pumps: AROM;Both;20 reps;Seated Quad Sets: Strengthening;Right;10 reps;Seated Short Arc Quad: Strengthening;Right;10 reps;Seated Heel Slides: AAROM;Right;10 reps;Seated Hip ABduction/ADduction: AAROM;Right;10 reps;Seated    General Comments General comments (skin integrity, edema, etc.): Pt. moved well today and showed no signs of forgetfullness or confusion.        Pertinent Vitals/Pain Pain Assessment: Faces Faces Pain Scale: Hurts little more Pain Location: R hip Pain Intervention(s): Limited activity within patient's tolerance;Monitored during session;Repositioned           PT Goals (current goals can now be found in the care plan section) Acute Rehab PT Goals Patient Stated Goal: Go to [facility] and then return home Progress towards PT goals: Progressing toward goals    Frequency  7X/week  PT Plan Current plan remains appropriate       End of Session   Activity Tolerance: Patient tolerated treatment well Patient left: in chair;with call bell/phone within reach     Time: 7493-5521 PT Time Calculation (min) (ACUTE ONLY): 27 min  Charges:  $Gait Training: 8-22 mins $Therapeutic Exercise:  8-22 mins                      Kallon Caylor 02/27/2015, 11:14 AM

## 2015-03-02 ENCOUNTER — Encounter: Payer: Self-pay | Admitting: Adult Health

## 2015-03-02 ENCOUNTER — Non-Acute Institutional Stay (SKILLED_NURSING_FACILITY): Payer: Medicare Other | Admitting: Adult Health

## 2015-03-02 DIAGNOSIS — K5900 Constipation, unspecified: Secondary | ICD-10-CM | POA: Diagnosis not present

## 2015-03-02 DIAGNOSIS — E1122 Type 2 diabetes mellitus with diabetic chronic kidney disease: Secondary | ICD-10-CM | POA: Diagnosis not present

## 2015-03-02 DIAGNOSIS — E785 Hyperlipidemia, unspecified: Secondary | ICD-10-CM

## 2015-03-02 DIAGNOSIS — I10 Essential (primary) hypertension: Secondary | ICD-10-CM

## 2015-03-02 DIAGNOSIS — M1611 Unilateral primary osteoarthritis, right hip: Secondary | ICD-10-CM | POA: Diagnosis not present

## 2015-03-02 DIAGNOSIS — D62 Acute posthemorrhagic anemia: Secondary | ICD-10-CM | POA: Diagnosis not present

## 2015-03-02 DIAGNOSIS — N183 Chronic kidney disease, stage 3 unspecified: Secondary | ICD-10-CM

## 2015-03-02 DIAGNOSIS — E039 Hypothyroidism, unspecified: Secondary | ICD-10-CM | POA: Diagnosis not present

## 2015-03-02 DIAGNOSIS — J690 Pneumonitis due to inhalation of food and vomit: Secondary | ICD-10-CM

## 2015-03-03 ENCOUNTER — Encounter: Payer: Self-pay | Admitting: Internal Medicine

## 2015-03-03 ENCOUNTER — Non-Acute Institutional Stay (SKILLED_NURSING_FACILITY): Payer: Medicare Other | Admitting: Internal Medicine

## 2015-03-03 DIAGNOSIS — N183 Chronic kidney disease, stage 3 (moderate): Secondary | ICD-10-CM | POA: Diagnosis not present

## 2015-03-03 DIAGNOSIS — K5900 Constipation, unspecified: Secondary | ICD-10-CM

## 2015-03-03 DIAGNOSIS — E039 Hypothyroidism, unspecified: Secondary | ICD-10-CM | POA: Diagnosis not present

## 2015-03-03 DIAGNOSIS — E1122 Type 2 diabetes mellitus with diabetic chronic kidney disease: Secondary | ICD-10-CM

## 2015-03-03 DIAGNOSIS — M1611 Unilateral primary osteoarthritis, right hip: Secondary | ICD-10-CM

## 2015-03-03 DIAGNOSIS — J690 Pneumonitis due to inhalation of food and vomit: Secondary | ICD-10-CM

## 2015-03-03 DIAGNOSIS — R2681 Unsteadiness on feet: Secondary | ICD-10-CM | POA: Diagnosis not present

## 2015-03-03 DIAGNOSIS — E1165 Type 2 diabetes mellitus with hyperglycemia: Secondary | ICD-10-CM | POA: Diagnosis not present

## 2015-03-03 DIAGNOSIS — D62 Acute posthemorrhagic anemia: Secondary | ICD-10-CM | POA: Diagnosis not present

## 2015-03-03 DIAGNOSIS — IMO0002 Reserved for concepts with insufficient information to code with codable children: Secondary | ICD-10-CM

## 2015-03-03 DIAGNOSIS — I10 Essential (primary) hypertension: Secondary | ICD-10-CM

## 2015-03-03 NOTE — Progress Notes (Signed)
Patient ID: Cassandra Dodson, female   DOB: 08-13-1940, 74 y.o.   MRN: [id number]     [place]  PCP: Precious Reel, MD  Code Status: Full Code   Allergies  Allergen Reactions  . Ether     Chief Complaint  Patient presents with  . New Admit To SNF    New Admission      HPI:  74 y.o. patient is here for short term rehabilitation post hospital admission from 02/23/15-02/27/15 with right hip OA. She underwent right hip arthroplasty. She was treated with antibiotic for aspiration pneumonia. She is seen in her room today. She had a bowel movement yesterday after more than a week. She complaints of cough with green phlegm and some runny nose. Denies sore throat. She would like prune juice with her meal.  Review of Systems:  Constitutional: Negative for fever, chills  HENT: Negative for headache, congestion, nasal discharge Eyes: Negative for blurred vision, double vision and discharge.  Respiratory: Negative for wheezing.  positive for some dyspnea with exertion Cardiovascular: Negative for chest pain, palpitations, leg swelling.  Gastrointestinal: Negative for heartburn, nausea, vomiting, abdominal pain Genitourinary: Negative for dysuria Musculoskeletal: Negative for back pain Neurological: Negative for dizziness Psychiatric/Behavioral: Negative for depression    Past Medical History  Diagnosis Date  . PONV (postoperative nausea and vomiting)     severe nausea and vomiting after knee replacement  . Hypertension   . Diabetes mellitus without complication ([place])   . Depression   . Urinary frequency   . GERD (gastroesophageal reflux disease)   . Arthritis   . Neuromuscular disorder (HCC)     neuropathy in feet  . Hypothyroidism   . CKD (chronic kidney disease), stage III    Past Surgical History  Procedure Laterality Date  . Joint replacement Right     right  . Cesarean section    . Irrigation and debridement sebaceous cyst    . Tonsillectomy    . Breast  surgery      cyst removed  . Teeth extration    . Total hip arthroplasty Right 02/23/2015    Procedure: RIGHT TOTAL HIP ARTHROPLASTY ANTERIOR APPROACH;  Surgeon: Leandrew Koyanagi, MD;  Location: [place];  Service: Orthopedics;  Laterality: Right;   Social History:   reports that she quit smoking about 12 years ago. Her smoking use included Cigarettes. She has a 67.5 pack-year smoking history. She has never used smokeless tobacco. She reports that she drinks alcohol. She reports that she does not use illicit drugs.  History reviewed. No pertinent family history.  Medications:   Medication List       This list is accurate as of: 03/03/15 11:30 AM.  Always use your most recent med list.               ampicillin-sulbactam 1.5 g in sodium chloride 0.9 % 50 mL  Inject 1.5 g into the vein every 6 (six) hours.     aspirin EC 325 MG tablet  Take 1 tablet (325 mg total) by mouth 2 (two) times daily.     CALCIUM 600 + D PO  Take 1 tablet by mouth daily.     levothyroxine 50 MCG tablet  Commonly known as:  SYNTHROID, LEVOTHROID  Take 50 mcg by mouth every evening.     losartan-hydrochlorothiazide 100-25 MG tablet  Commonly known as:  HYZAAR  Take 1 tablet by mouth daily.     metFORMIN 1000 MG tablet  Commonly known as:  GLUCOPHAGE  Take 1 tablet by mouth 2 (two) times daily.     methocarbamol 750 MG tablet  Commonly known as:  ROBAXIN  Take 1 tablet (750 mg total) by mouth 2 (two) times daily as needed for muscle spasms.     multivitamin with minerals tablet  Take 1 tablet by mouth daily.     oxyCODONE 5 MG immediate release tablet  Commonly known as:  Oxy IR/ROXICODONE  Take 1-3 tablets (5-15 mg total) by mouth every 4 (four) hours as needed.     oxyCODONE 10 mg 12 hr tablet  Commonly known as:  OXYCONTIN  Take 1 tablet (10 mg total) by mouth every 12 (twelve) hours.     polyethylene glycol packet  Commonly known as:  MIRALAX / GLYCOLAX  Take 17 g by mouth daily.      saxagliptin HCl 2.5 MG Tabs tablet  Commonly known as:  ONGLYZA  Take 2.5 mg by mouth daily.     SENOKOT S 8.6-50 MG tablet  Generic drug:  senna-docusate  Take 1 tablet by mouth at bedtime.     vitamin C 500 MG tablet  Commonly known as:  ASCORBIC ACID  Take 500 mg by mouth daily.         Physical Exam: Filed Vitals:   03/03/15 1117  BP: 131/76  Pulse: 89  Temp: 98 F (36.7 C)  TempSrc: Oral  Resp: 18  Weight: 206 lb (93.441 kg)  SpO2: 97%    General- elderly female, obese, in no acute distress Head- normocephalic, atraumatic Nose- no maxillary or frontal sinus tenderness, no nasal discharge Throat- moist mucus membrane Eyes- PERRLA, EOMI, no pallor, no icterus, no discharge, normal conjunctiva, normal sclera Neck- no cervical lymphadenopathy Cardiovascular- normal s1,s2, no murmurs, right trace leg edema Respiratory- bilateral clear to auscultation, no wheeze, no rhonchi, no crackles, no use of accessory muscles Abdomen- bowel sounds present, soft, non tender Musculoskeletal- able to move all 4 extremities, limited right leg range of motion  Neurological- alert and oriented to person, place and time Skin- warm and dry, right hip has aquacel dressing in place Psychiatry- normal mood and affect    Labs reviewed: Basic Metabolic Panel:  Recent Labs  02/13/15 1305 02/24/15 0456  NA 138 135  K 3.7 4.1  CL 101 99*  CO2 22 26  GLUCOSE 226* 219*  BUN 30* 30*  CREATININE 1.76* 1.48*  CALCIUM 10.2 8.7*   Liver Function Tests:  Recent Labs  02/13/15 1305  AST 23  ALT 14  ALKPHOS 69  BILITOT 0.4  PROT 7.2  ALBUMIN 4.0   No results for input(s): LIPASE, AMYLASE in the last 8760 hours. No results for input(s): AMMONIA in the last 8760 hours. CBC:  Recent Labs  02/13/15 1305 02/24/15 0456 02/25/15 0459 02/26/15 0244  WBC 9.3 7.1 7.5 7.2  NEUTROABS 5.4  --   --   --   HGB 13.6 10.7* 9.9* 9.3*  HCT 42.0 32.8* 30.5* 28.7*  MCV 90.9 90.4 88.9 88.9   PLT 250 145* 155 149*   Cardiac Enzymes: No results for input(s): CKTOTAL, CKMB, CKMBINDEX, TROPONINI in the last 8760 hours. BNP: Invalid input(s): POCBNP CBG:  Recent Labs  02/26/15 2233 02/27/15 0627 02/27/15 1121  GLUCAP 168* 169* 255*    Assessment/Plan  Unsteady gait Post right hip surgery. Will have patient work with PT/OT as tolerated to regain strength and restore function.  Fall precautions are in place.  Right hip  Osteoarthritis  S/P right total hip arthroplasty. Will have her work with physical therapy and occupational therapy team to help with gait training and muscle strengthening exercises.fall precautions. Skin care. Encourage to be out of bed. Continue oxycodone 10 mg bid with prn oxyIR for pain. Continue aspirin 325 mg bid for dvt prophylaxis. Continue robaxin for muscle spasm. Has f/u with orthopedics.  Blood loss anemia Post op, check cbc  Aspiration pneumonia Has picc line, complete course of unasyn on 03/04/16. Continue picc line care for now. Monitor wbc and temp curve  Constipation Continue senna s qhs and miralax daily as needed  HTN Stable bp reading. Continue micardis and monitor BP  Dm type 2 Monitor cbg, continue onglyza and metformin current regimen.  Hypothyroidism continue levothyroxine 50 mcg daily  Chronic kidney disease stage III Monitor bmp   Goals of care: short term rehabilitation   Labs/tests ordered: cbc, bmp  Family/ staff Communication: reviewed care plan with patient and nursing supervisor    Blanchie Serve, MD  [facility] [phone] (Monday-Friday 8 am - 5 pm) [phone] (afterhours)

## 2015-03-08 DIAGNOSIS — R2681 Unsteadiness on feet: Secondary | ICD-10-CM | POA: Diagnosis not present

## 2015-03-08 DIAGNOSIS — M6281 Muscle weakness (generalized): Secondary | ICD-10-CM | POA: Diagnosis not present

## 2015-03-08 DIAGNOSIS — Z96641 Presence of right artificial hip joint: Secondary | ICD-10-CM | POA: Diagnosis not present

## 2015-03-08 DIAGNOSIS — R262 Difficulty in walking, not elsewhere classified: Secondary | ICD-10-CM | POA: Diagnosis not present

## 2015-03-08 DIAGNOSIS — M25551 Pain in right hip: Secondary | ICD-10-CM | POA: Diagnosis not present

## 2015-03-08 DIAGNOSIS — R600 Localized edema: Secondary | ICD-10-CM | POA: Diagnosis not present

## 2015-03-10 DIAGNOSIS — Z96641 Presence of right artificial hip joint: Secondary | ICD-10-CM | POA: Diagnosis not present

## 2015-03-10 DIAGNOSIS — R600 Localized edema: Secondary | ICD-10-CM | POA: Diagnosis not present

## 2015-03-10 DIAGNOSIS — R262 Difficulty in walking, not elsewhere classified: Secondary | ICD-10-CM | POA: Diagnosis not present

## 2015-03-10 DIAGNOSIS — M6281 Muscle weakness (generalized): Secondary | ICD-10-CM | POA: Diagnosis not present

## 2015-03-10 DIAGNOSIS — M25551 Pain in right hip: Secondary | ICD-10-CM | POA: Diagnosis not present

## 2015-03-10 DIAGNOSIS — R2681 Unsteadiness on feet: Secondary | ICD-10-CM | POA: Diagnosis not present

## 2015-03-13 ENCOUNTER — Non-Acute Institutional Stay (SKILLED_NURSING_FACILITY): Payer: Medicare Other | Admitting: Adult Health

## 2015-03-13 ENCOUNTER — Encounter: Payer: Self-pay | Admitting: Adult Health

## 2015-03-13 DIAGNOSIS — M1611 Unilateral primary osteoarthritis, right hip: Secondary | ICD-10-CM

## 2015-03-13 DIAGNOSIS — K5900 Constipation, unspecified: Secondary | ICD-10-CM

## 2015-03-13 DIAGNOSIS — D62 Acute posthemorrhagic anemia: Secondary | ICD-10-CM | POA: Diagnosis not present

## 2015-03-13 DIAGNOSIS — M6281 Muscle weakness (generalized): Secondary | ICD-10-CM | POA: Diagnosis not present

## 2015-03-13 DIAGNOSIS — J690 Pneumonitis due to inhalation of food and vomit: Secondary | ICD-10-CM | POA: Diagnosis not present

## 2015-03-13 DIAGNOSIS — M25551 Pain in right hip: Secondary | ICD-10-CM | POA: Diagnosis not present

## 2015-03-13 DIAGNOSIS — I10 Essential (primary) hypertension: Secondary | ICD-10-CM

## 2015-03-13 DIAGNOSIS — E1122 Type 2 diabetes mellitus with diabetic chronic kidney disease: Secondary | ICD-10-CM | POA: Diagnosis not present

## 2015-03-13 DIAGNOSIS — N183 Chronic kidney disease, stage 3 unspecified: Secondary | ICD-10-CM

## 2015-03-13 DIAGNOSIS — E039 Hypothyroidism, unspecified: Secondary | ICD-10-CM | POA: Diagnosis not present

## 2015-03-13 DIAGNOSIS — Z96641 Presence of right artificial hip joint: Secondary | ICD-10-CM | POA: Diagnosis not present

## 2015-03-13 DIAGNOSIS — R262 Difficulty in walking, not elsewhere classified: Secondary | ICD-10-CM | POA: Diagnosis not present

## 2015-03-13 DIAGNOSIS — R2681 Unsteadiness on feet: Secondary | ICD-10-CM | POA: Diagnosis not present

## 2015-03-13 DIAGNOSIS — R600 Localized edema: Secondary | ICD-10-CM | POA: Diagnosis not present

## 2015-03-14 NOTE — Progress Notes (Signed)
Patient ID: Cassandra Dodson, female   DOB: March 30, 1941, 74 y.o.   MRN: [id number]    DATE:  03/13/15  MRN:  [id number]  BIRTHDAY: 10/27/1940  Facility:  Nursing Home Location:  [place] Room Number: [id number]  LEVEL OF CARE:  SNF (31)  Contact Information    Name Relation Home Work [place] Son   [id number]   Arva, Yousif Relative   [id number]       Chief Complaint  Patient presents with  . Discharge Note    Osteoarthritis S/P right total hip arthroplasty, hyperlipidemia, hypothyroidism, anemia, chronic kidney disease stage III, diabetes mellitus, constipation, hypertension and aspiration pneumonia    HISTORY OF PRESENT ILLNESS:  This is a 74 year old female who is for discharge home with home health PT for endurance and OT for ADLs. DME:  3 in 1 bedside commode. She has been admitted to [facility] on 02/27/15 from [facility]. She has PMH of hypertension, diabetes mellitus, depression, GERD, urinary frequency, hypothyroidism and chronic kidney disease stage III. She has osteoarthritis of right hip for which she had right total hip arthroplasty done on 02/23/15. She has completed IV Unasyn for aspiration pneumonia.  Patient was admitted to this facility for short-term rehabilitation after the patient's recent hospitalization.  Patient has completed SNF rehabilitation and therapy has cleared the patient for discharge.  PAST MEDICAL HISTORY:  Past Medical History  Diagnosis Date  . PONV (postoperative nausea and vomiting)     severe nausea and vomiting after knee replacement  . Hypertension   . Diabetes mellitus without complication ([place])   . Depression   . Urinary frequency   . GERD (gastroesophageal reflux disease)   . Arthritis   . Neuromuscular disorder (HCC)     neuropathy in feet  . Hypothyroidism   . CKD (chronic kidney disease), stage III     CURRENT MEDICATIONS: Reviewed    Medication List       This list  is accurate as of: 03/13/15 11:59 PM.  Always use your most recent med list.               aspirin EC 325 MG tablet  Take 1 tablet (325 mg total) by mouth 2 (two) times daily.     CALCIUM 600 + D PO  Take 1 tablet by mouth daily.     ferrous sulfate 325 (65 FE) MG tablet  Take 325 mg by mouth daily.     levothyroxine 50 MCG tablet  Commonly known as:  SYNTHROID, LEVOTHROID  Take 50 mcg by mouth every evening.     losartan-hydrochlorothiazide 100-25 MG tablet  Commonly known as:  HYZAAR  Take 1 tablet by mouth daily.     metFORMIN 1000 MG tablet  Commonly known as:  GLUCOPHAGE  Take 1 tablet by mouth 2 (two) times daily.     methocarbamol 750 MG tablet  Commonly known as:  ROBAXIN  Take 1 tablet (750 mg total) by mouth 2 (two) times daily as needed for muscle spasms.     multivitamin with minerals tablet  Take 1 tablet by mouth daily.     oxyCODONE 5 MG immediate release tablet  Commonly known as:  Oxy IR/ROXICODONE  Take 1-3 tablets (5-15 mg total) by mouth every 4 (four) hours as needed.     oxyCODONE 10 mg 12 hr tablet  Commonly known as:  OXYCONTIN  Take 1 tablet (10 mg total) by mouth every 12 (  twelve) hours.     polyethylene glycol packet  Commonly known as:  MIRALAX / GLYCOLAX  Take 17 g by mouth daily.     saxagliptin HCl 2.5 MG Tabs tablet  Commonly known as:  ONGLYZA  Take 2.5 mg by mouth daily.     SENOKOT S 8.6-50 MG tablet  Generic drug:  senna-docusate  Take 1 tablet by mouth at bedtime.     vitamin C 500 MG tablet  Commonly known as:  ASCORBIC ACID  Take 500 mg by mouth daily.        Allergies  Allergen Reactions  . Ether      REVIEW OF SYSTEMS:  GENERAL: no change in appetite, no fatigue, no weight changes, no fever, chills or weakness EYES: Denies change in vision, dry eyes, eye pain, itching or discharge EARS: Denies change in hearing, ringing in ears, or earache NOSE: Denies nasal congestion or epistaxis MOUTH and THROAT:  Denies oral discomfort, gingival pain or bleeding, pain from teeth or hoarseness   RESPIRATORY: no cough, SOB, DOE, wheezing, hemoptysis CARDIAC: no chest pain, edema or palpitations GI: no abdominal pain, diarrhea, constipation, heart burn, nausea or vomiting GU: Denies dysuria, frequency, hematuria, incontinence, or discharge PSYCHIATRIC: Denies feeling of depression or anxiety. No report of hallucinations, insomnia, paranoia, or agitation   PHYSICAL EXAMINATION  GENERAL APPEARANCE: Well nourished. In no acute distress. Obese SKIN:  Right hip surgical incision has Aquacel dressing, dry, no erythema HEAD: Normal in size and contour. No evidence of trauma EYES: Lids open and close normally. No blepharitis, entropion or ectropion. PERRL. Conjunctivae are clear and sclerae are white. Lenses are without opacity EARS: Pinnae are normal. Patient hears normal voice tunes of the examiner MOUTH and THROAT: Lips are without lesions. Oral mucosa is moist and without lesions. Tongue is normal in shape, size, and color and without lesions NECK: supple, trachea midline, no neck masses, no thyroid tenderness, no thyromegaly LYMPHATICS: no LAN in the neck, no supraclavicular LAN RESPIRATORY: breathing is even & unlabored, BS CTAB CARDIAC: RRR, no murmur,no extra heart sounds, no edema GI: abdomen soft, normal BS, no masses, no tenderness, no hepatomegaly, no splenomegaly EXTREMITIES:  Able to move 4 extremities PSYCHIATRIC: Alert and oriented X 3. Affect and behavior are appropriate  LABS/RADIOLOGY: Labs reviewed: 03/02/15  WBC 7.6 hemoglobin 9.3 hematocrit 29.8 MCV 91.4 platelet 257 sodium 136 potassium 4.1 glucose 152 BUN 25 creatinine 1.01 calcium 9.1 TSH [id number] Basic Metabolic Panel:  Recent Labs  02/13/15 1305 02/24/15 0456  NA 138 135  K 3.7 4.1  CL 101 99*  CO2 22 26  GLUCOSE 226* 219*  BUN 30* 30*  CREATININE 1.76* 1.48*  CALCIUM 10.2 8.7*   Liver Function Tests:  Recent Labs   02/13/15 1305  AST 23  ALT 14  ALKPHOS 69  BILITOT 0.4  PROT 7.2  ALBUMIN 4.0   CBC:  Recent Labs  02/13/15 1305 02/24/15 0456 02/25/15 0459 02/26/15 0244  WBC 9.3 7.1 7.5 7.2  NEUTROABS 5.4  --   --   --   HGB 13.6 10.7* 9.9* 9.3*  HCT 42.0 32.8* 30.5* 28.7*  MCV 90.9 90.4 88.9 88.9  PLT 250 145* 155 149*   CBG:  Recent Labs  02/26/15 2233 02/27/15 0627 02/27/15 1121  GLUCAP 168* 169* 255*    Dg Chest 1 View  02/23/2015  CLINICAL DATA:  74 year old female with vomiting and possible aspiration following surgery EXAM: CHEST 1 VIEW COMPARISON:  Prior chest x-ray 07/26/2003 FINDINGS:  Slightly low inspiratory volumes. No focal airspace opacity, pleural effusion, pulmonary edema or pneumothorax. Cardiac and mediastinal contours are within normal limits. No acute osseous abnormality. IMPRESSION: Slightly low inspiratory volumes. Otherwise, negative chest x-ray. Electronically Signed   By: Jacqulynn Cadet M.D.   On: 02/23/2015 17:00   Dg Pelvis Portable  02/23/2015  CLINICAL DATA:  74 year old female status post right hip arthroplasty EXAM: PORTABLE PELVIS 1-2 VIEWS COMPARISON:  Intraoperative radiographs obtained earlier today FINDINGS: Surgical changes of right hip arthroplasty. No evidence of immediate hardware complication. Expected postoperative subcutaneous emphysema. The visualized bony pelvis is intact. Trace atherosclerotic calcifications present overlying both superficial femoral arteries. IMPRESSION: Right hip arthroplasty without evidence of acute complication. Atherosclerotic vascular calcifications. Electronically Signed   By: Jacqulynn Cadet M.D.   On: 02/23/2015 16:59   Dg Hip Operative Unilat With Pelvis Right  02/23/2015  CLINICAL DATA:  Anterior right total hip arthroplasty for osteoarthritis EXAM: OPERATIVE RIGHT HIP (WITH PELVIS IF PERFORMED) fluoroscopic VIEWS TECHNIQUE: Fluoroscopic spot image(s) were submitted for interpretation post-operatively.  COMPARISON:  None. FINDINGS: Fluoroscopy time 34 seconds. Four spot fluoroscopic nondiagnostic intraoperative radiographs of the right hip were provided, which demonstrate postsurgical changes from right total hip arthroplasty. IMPRESSION: Intraoperative fluoroscopic guidance for right total hip arthroplasty. Electronically Signed   By: Ilona Sorrel M.D.   On: 02/23/2015 15:32    ASSESSMENT/PLAN:  Osteoarthritis S/P right total hip arthroplasty - for home health PT and OT ; continue ASA EC 325 mg 1 tab by mouth twice a day for DVT prophylaxis; Oxycodone 10 mg 12 hour 1 tab by mouth every 12 hours and oxycodone 5 mg IR 1-3 tabs by mouth every 4 hours when necessary for pain; follow-up with Dr. Erlinda Hong, orthopedic surgeon  Hypothyroidism - continue levothyroxine 50 g 1 tab by mouth daily;  TSH 2.129  Anemia, acute blood loss - hemoglobin 9.3; recently started on ferrous sulfate 325 mg 1 tab by mouth daily  Chronic kidney disease stage III - creatinine 1.01, improved from 1.48  Diabetes mellitus, type II - continue metformin 1000 mg 1 tab by mouth twice a day and Onglyza 2.5 mg 1 tab by mouth daily  Constipation - continue senna S1 tab by mouth daily at bedtime when necessary and MiraLAX 17 g by mouth daily when necessary  Hypertension - continue losartan-HCTZ 100-25 mg 1 tab by mouth daily   Aspiration pneumonia - Resolved; completed Unasyn IV and PICC has been discontinued      I have filled out patient's discharge paperwork and written prescriptions.  Patient will receive home health PT and OT.  DME provided: 3 in 1 bedside commode  Total discharge time: Greater than 30 minutes  Discharge time involved coordination of the discharge process with social worker, nursing staff and therapy department. Medical justification for home health services/DME verified.     [facility], NP [employer] [phone]

## 2015-03-14 NOTE — Progress Notes (Addendum)
Patient ID: Cassandra Dodson, female   DOB: 02-Dec-1940, 74 y.o.   MRN: [id number]    DATE:  03/02/15  MRN:  [id number]  BIRTHDAY: [birth year]  Facility:  Nursing Home Location:  [place] Room Number: [id number]  LEVEL OF CARE:  SNF (31)  Contact Information    Name Relation Home Work [place] Son   [id number]   [place], [place] Relative   [id number]       Chief Complaint  Patient presents with  . Hospitalization Follow-up    Osteoarthritis S/P right total hip arthroplasty, hyperlipidemia, hypothyroidism, anemia, chronic kidney disease stage III, diabetes mellitus, constipation, hypertension and aspiration pneumonia    HISTORY OF PRESENT ILLNESS:  This is a 74 year old female who has been admitted to [facility] on 02/27/15 from [facility]. She has PMH of hypertension, diabetes mellitus, depression, GERD, urinary frequency, hypothyroidism and chronic kidney disease stage III. She has osteoarthritis of right hip for which she had right total hip arthroplasty done on 02/23/15. IV Unasyn will be given for aspiration pneumonia.  She has been admitted for a short-term rehabilitation.  PAST MEDICAL HISTORY:  Past Medical History  Diagnosis Date  . PONV (postoperative nausea and vomiting)     severe nausea and vomiting after knee replacement  . Hypertension   . Diabetes mellitus without complication ([place])   . Depression   . Urinary frequency   . GERD (gastroesophageal reflux disease)   . Arthritis   . Neuromuscular disorder (HCC)     neuropathy in feet  . Hypothyroidism   . CKD (chronic kidney disease), stage III      CURRENT MEDICATIONS: Reviewed   Patient's Medications  New Prescriptions   No medications on file  Previous Medications   AMPICILLIN-SULBACTAM 1.5 G IN SODIUM CHLORIDE 0.9 % 50 ML    Inject 1.5 g into the vein every 6 (six) hours.   ASPIRIN EC 325 MG TABLET    Take 1 tablet (325 mg total) by mouth 2 (two)  times daily.   CALCIUM CARB-CHOLECALCIFEROL (CALCIUM 600 + D PO)    Take 1 tablet by mouth daily.   LEVOTHYROXINE (SYNTHROID, LEVOTHROID) 50 MCG TABLET    Take 50 mcg by mouth every evening.       METFORMIN (GLUCOPHAGE) 1000 MG TABLET    Take 1 tablet by mouth 2 (two) times daily.   METHOCARBAMOL (ROBAXIN) 750 MG TABLET    Take 1 tablet (750 mg total) by mouth 2 (two) times daily as needed for muscle spasms.   MULTIPLE VITAMINS-MINERALS (MULTIVITAMIN WITH MINERALS) TABLET    Take 1 tablet by mouth daily.   OXYCODONE (OXY IR/ROXICODONE) 5 MG IMMEDIATE RELEASE TABLET    Take 1-3 tablets (5-15 mg total) by mouth every 4 (four) hours as needed.   OXYCODONE (OXYCONTIN) 10 MG 12 HR TABLET    Take 1 tablet (10 mg total) by mouth every 12 (twelve) hours.   POLYETHYLENE GLYCOL (MIRALAX / GLYCOLAX) PACKET    Take 17 g by mouth daily.   SAXAGLIPTIN HCL (ONGLYZA) 2.5 MG TABS TABLET    Take 2.5 mg by mouth daily.   VITAMIN C (ASCORBIC ACID) 500 MG TABLET    Take 500 mg by mouth daily.  Modified Medications   Modified Medication Previous Medication   SENNA-DOCUSATE (SENOKOT S) 8.6-50 MG TABLET senna-docusate (SENOKOT S) 8.6-50 MG tablet      Take 1 tablet by mouth at bedtime.as needed  Take 1 tablet by mouth at bedtime as needed.     FENOFIBRATE 160 MG TABLET    Take 160 mg by mouth daily.   HYDROCODONE-ACETAMINOPHEN (NORCO) 7.5-325 MG TABLET    Take 1 tablet by mouth every 6 (six) hours as needed for moderate pain.   TELMISARTAN-HYDROCHLOROTHIAZIDE (MICARDIS HCT) 80-25 MG TABLET    Take 1 tablet by mouth daily.    Allergies  Allergen Reactions  . Ether      REVIEW OF SYSTEMS:  GENERAL: no change in appetite, no fatigue, no weight changes, no fever, chills or weakness EYES: Denies change in vision, dry eyes, eye pain, itching or discharge EARS: Denies change in hearing, ringing in ears, or earache NOSE: Denies nasal congestion or epistaxis MOUTH and THROAT: Denies oral discomfort, gingival pain  or bleeding, pain from teeth or hoarseness   RESPIRATORY: no cough, SOB, DOE, wheezing, hemoptysis CARDIAC: no chest pain, edema or palpitations GI: no abdominal pain, diarrhea, constipation, heart burn, nausea or vomiting GU: Denies dysuria, frequency, hematuria, incontinence, or discharge PSYCHIATRIC: Denies feeling of depression or anxiety. No report of hallucinations, insomnia, paranoia, or agitation   PHYSICAL EXAMINATION  GENERAL APPEARANCE: Well nourished. In no acute distress. Obese SKIN:  Right hip surgical incision has Aquacel dressing, dry, no erythema HEAD: Normal in size and contour. No evidence of trauma EYES: Lids open and close normally. No blepharitis, entropion or ectropion. PERRL. Conjunctivae are clear and sclerae are white. Lenses are without opacity EARS: Pinnae are normal. Patient hears normal voice tunes of the examiner MOUTH and THROAT: Lips are without lesions. Oral mucosa is moist and without lesions. Tongue is normal in shape, size, and color and without lesions NECK: supple, trachea midline, no neck masses, no thyroid tenderness, no thyromegaly LYMPHATICS: no LAN in the neck, no supraclavicular LAN RESPIRATORY: breathing is even & unlabored, BS CTAB CARDIAC: RRR, no murmur,no extra heart sounds, no edema; right upper arm single-lumen PICC GI: abdomen soft, normal BS, no masses, no tenderness, no hepatomegaly, no splenomegaly EXTREMITIES:  Able to move 4 extremities PSYCHIATRIC: Alert and oriented X 3. Affect and behavior are appropriate  LABS/RADIOLOGY: Labs reviewed: Basic Metabolic Panel:  Recent Labs  02/13/15 1305 02/24/15 0456  NA 138 135  K 3.7 4.1  CL 101 99*  CO2 22 26  GLUCOSE 226* 219*  BUN 30* 30*  CREATININE 1.76* 1.48*  CALCIUM 10.2 8.7*   Liver Function Tests:  Recent Labs  02/13/15 1305  AST 23  ALT 14  ALKPHOS 69  BILITOT 0.4  PROT 7.2  ALBUMIN 4.0   CBC:  Recent Labs  02/13/15 1305 02/24/15 0456 02/25/15 0459  02/26/15 0244  WBC 9.3 7.1 7.5 7.2  NEUTROABS 5.4  --   --   --   HGB 13.6 10.7* 9.9* 9.3*  HCT 42.0 32.8* 30.5* 28.7*  MCV 90.9 90.4 88.9 88.9  PLT 250 145* 155 149*   CBG:  Recent Labs  02/26/15 2233 02/27/15 0627 02/27/15 1121  GLUCAP 168* 169* 255*    Dg Chest 1 View  02/23/2015  CLINICAL DATA:  74 year old female with vomiting and possible aspiration following surgery EXAM: CHEST 1 VIEW COMPARISON:  Prior chest x-ray 07/26/2003 FINDINGS: Slightly low inspiratory volumes. No focal airspace opacity, pleural effusion, pulmonary edema or pneumothorax. Cardiac and mediastinal contours are within normal limits. No acute osseous abnormality. IMPRESSION: Slightly low inspiratory volumes. Otherwise, negative chest x-ray. Electronically Signed   By: Jacqulynn Cadet M.D.   On: 02/23/2015 17:00  Dg Pelvis Portable  02/23/2015  CLINICAL DATA:  74 year old female status post right hip arthroplasty EXAM: PORTABLE PELVIS 1-2 VIEWS COMPARISON:  Intraoperative radiographs obtained earlier today FINDINGS: Surgical changes of right hip arthroplasty. No evidence of immediate hardware complication. Expected postoperative subcutaneous emphysema. The visualized bony pelvis is intact. Trace atherosclerotic calcifications present overlying both superficial femoral arteries. IMPRESSION: Right hip arthroplasty without evidence of acute complication. Atherosclerotic vascular calcifications. Electronically Signed   By: Jacqulynn Cadet M.D.   On: 02/23/2015 16:59   Dg Hip Operative Unilat With Pelvis Right  02/23/2015  CLINICAL DATA:  Anterior right total hip arthroplasty for osteoarthritis EXAM: OPERATIVE RIGHT HIP (WITH PELVIS IF PERFORMED) fluoroscopic VIEWS TECHNIQUE: Fluoroscopic spot image(s) were submitted for interpretation post-operatively. COMPARISON:  None. FINDINGS: Fluoroscopy time 34 seconds. Four spot fluoroscopic nondiagnostic intraoperative radiographs of the right hip were provided, which  demonstrate postsurgical changes from right total hip arthroplasty. IMPRESSION: Intraoperative fluoroscopic guidance for right total hip arthroplasty. Electronically Signed   By: Ilona Sorrel M.D.   On: 02/23/2015 15:32    ASSESSMENT/PLAN:  Osteoarthritis S/P right total hip arthroplasty - for rehabilitation; continue ASA EC 325 mg 1 tab by mouth twice a day for DVT prophylaxis; Norco 7.5/325 mg 1 tab by mouth every 6 hours when necessary, oxycodone 10 mg 12 hour 1 tab by mouth every 12 hours and oxycodone 5 mg IR 1-3 tabs by mouth every 4 hours when necessary for pain; follow-up with Dr. Erlinda Hong, orthopedic surgeon, in 2 weeks  Hyperlipidemia - continue fenofibrate 160 mg 1 tab by mouth daily  Hypothyroidism - continue levothyroxine 50 g 1 tab by mouth daily; check TSH  Anemia, acute blood loss - hemoglobin 9.3; check CBC  Chronic kidney disease stage III - creatinine 1.48; check BMP  Diabetes mellitus, type II - continue metformin 1000 mg 1 tab by mouth twice a day and Onglyza 2.5 mg 1 tab by mouth daily  Constipation - continue senna S1 tab by mouth daily at bedtime when necessary  Hypertension - continue Micardis HCT 18-25 mg 1 tab by mouth daily  Aspiration pneumonia - continue Unasyn 1.5 g IV every 6 hours till 03/05/15     Goals of care:  Short-term rehabilitation    [facility], [facility] [phone]

## 2015-03-15 DIAGNOSIS — Z471 Aftercare following joint replacement surgery: Secondary | ICD-10-CM | POA: Diagnosis not present

## 2015-03-15 DIAGNOSIS — G709 Myoneural disorder, unspecified: Secondary | ICD-10-CM | POA: Diagnosis not present

## 2015-03-15 DIAGNOSIS — M1990 Unspecified osteoarthritis, unspecified site: Secondary | ICD-10-CM | POA: Diagnosis not present

## 2015-03-15 DIAGNOSIS — E119 Type 2 diabetes mellitus without complications: Secondary | ICD-10-CM | POA: Diagnosis not present

## 2015-03-15 DIAGNOSIS — I129 Hypertensive chronic kidney disease with stage 1 through stage 4 chronic kidney disease, or unspecified chronic kidney disease: Secondary | ICD-10-CM | POA: Diagnosis not present

## 2015-03-15 DIAGNOSIS — N183 Chronic kidney disease, stage 3 (moderate): Secondary | ICD-10-CM | POA: Diagnosis not present

## 2015-03-20 DIAGNOSIS — N183 Chronic kidney disease, stage 3 (moderate): Secondary | ICD-10-CM | POA: Diagnosis not present

## 2015-03-20 DIAGNOSIS — I129 Hypertensive chronic kidney disease with stage 1 through stage 4 chronic kidney disease, or unspecified chronic kidney disease: Secondary | ICD-10-CM | POA: Diagnosis not present

## 2015-03-20 DIAGNOSIS — M1990 Unspecified osteoarthritis, unspecified site: Secondary | ICD-10-CM | POA: Diagnosis not present

## 2015-03-20 DIAGNOSIS — Z471 Aftercare following joint replacement surgery: Secondary | ICD-10-CM | POA: Diagnosis not present

## 2015-03-20 DIAGNOSIS — G709 Myoneural disorder, unspecified: Secondary | ICD-10-CM | POA: Diagnosis not present

## 2015-03-20 DIAGNOSIS — E119 Type 2 diabetes mellitus without complications: Secondary | ICD-10-CM | POA: Diagnosis not present

## 2015-03-22 DIAGNOSIS — N183 Chronic kidney disease, stage 3 (moderate): Secondary | ICD-10-CM | POA: Diagnosis not present

## 2015-03-22 DIAGNOSIS — M1990 Unspecified osteoarthritis, unspecified site: Secondary | ICD-10-CM | POA: Diagnosis not present

## 2015-03-22 DIAGNOSIS — Z471 Aftercare following joint replacement surgery: Secondary | ICD-10-CM | POA: Diagnosis not present

## 2015-03-22 DIAGNOSIS — E119 Type 2 diabetes mellitus without complications: Secondary | ICD-10-CM | POA: Diagnosis not present

## 2015-03-22 DIAGNOSIS — I129 Hypertensive chronic kidney disease with stage 1 through stage 4 chronic kidney disease, or unspecified chronic kidney disease: Secondary | ICD-10-CM | POA: Diagnosis not present

## 2015-03-22 DIAGNOSIS — G709 Myoneural disorder, unspecified: Secondary | ICD-10-CM | POA: Diagnosis not present

## 2015-03-24 DIAGNOSIS — G709 Myoneural disorder, unspecified: Secondary | ICD-10-CM | POA: Diagnosis not present

## 2015-03-24 DIAGNOSIS — N183 Chronic kidney disease, stage 3 (moderate): Secondary | ICD-10-CM | POA: Diagnosis not present

## 2015-03-24 DIAGNOSIS — E119 Type 2 diabetes mellitus without complications: Secondary | ICD-10-CM | POA: Diagnosis not present

## 2015-03-24 DIAGNOSIS — Z471 Aftercare following joint replacement surgery: Secondary | ICD-10-CM | POA: Diagnosis not present

## 2015-03-24 DIAGNOSIS — I129 Hypertensive chronic kidney disease with stage 1 through stage 4 chronic kidney disease, or unspecified chronic kidney disease: Secondary | ICD-10-CM | POA: Diagnosis not present

## 2015-03-24 DIAGNOSIS — M1990 Unspecified osteoarthritis, unspecified site: Secondary | ICD-10-CM | POA: Diagnosis not present

## 2015-03-27 DIAGNOSIS — M1990 Unspecified osteoarthritis, unspecified site: Secondary | ICD-10-CM | POA: Diagnosis not present

## 2015-03-27 DIAGNOSIS — G709 Myoneural disorder, unspecified: Secondary | ICD-10-CM | POA: Diagnosis not present

## 2015-03-27 DIAGNOSIS — E119 Type 2 diabetes mellitus without complications: Secondary | ICD-10-CM | POA: Diagnosis not present

## 2015-03-27 DIAGNOSIS — Z471 Aftercare following joint replacement surgery: Secondary | ICD-10-CM | POA: Diagnosis not present

## 2015-03-27 DIAGNOSIS — I129 Hypertensive chronic kidney disease with stage 1 through stage 4 chronic kidney disease, or unspecified chronic kidney disease: Secondary | ICD-10-CM | POA: Diagnosis not present

## 2015-03-27 DIAGNOSIS — N183 Chronic kidney disease, stage 3 (moderate): Secondary | ICD-10-CM | POA: Diagnosis not present

## 2015-03-29 DIAGNOSIS — G709 Myoneural disorder, unspecified: Secondary | ICD-10-CM | POA: Diagnosis not present

## 2015-03-29 DIAGNOSIS — Z471 Aftercare following joint replacement surgery: Secondary | ICD-10-CM | POA: Diagnosis not present

## 2015-03-29 DIAGNOSIS — E119 Type 2 diabetes mellitus without complications: Secondary | ICD-10-CM | POA: Diagnosis not present

## 2015-03-29 DIAGNOSIS — I129 Hypertensive chronic kidney disease with stage 1 through stage 4 chronic kidney disease, or unspecified chronic kidney disease: Secondary | ICD-10-CM | POA: Diagnosis not present

## 2015-03-29 DIAGNOSIS — M1990 Unspecified osteoarthritis, unspecified site: Secondary | ICD-10-CM | POA: Diagnosis not present

## 2015-03-29 DIAGNOSIS — N183 Chronic kidney disease, stage 3 (moderate): Secondary | ICD-10-CM | POA: Diagnosis not present

## 2015-03-30 DIAGNOSIS — I129 Hypertensive chronic kidney disease with stage 1 through stage 4 chronic kidney disease, or unspecified chronic kidney disease: Secondary | ICD-10-CM | POA: Diagnosis not present

## 2015-03-30 DIAGNOSIS — E119 Type 2 diabetes mellitus without complications: Secondary | ICD-10-CM | POA: Diagnosis not present

## 2015-03-30 DIAGNOSIS — M1990 Unspecified osteoarthritis, unspecified site: Secondary | ICD-10-CM | POA: Diagnosis not present

## 2015-03-30 DIAGNOSIS — Z471 Aftercare following joint replacement surgery: Secondary | ICD-10-CM | POA: Diagnosis not present

## 2015-03-30 DIAGNOSIS — G709 Myoneural disorder, unspecified: Secondary | ICD-10-CM | POA: Diagnosis not present

## 2015-03-30 DIAGNOSIS — N183 Chronic kidney disease, stage 3 (moderate): Secondary | ICD-10-CM | POA: Diagnosis not present

## 2015-04-03 DIAGNOSIS — Z471 Aftercare following joint replacement surgery: Secondary | ICD-10-CM | POA: Diagnosis not present

## 2015-04-03 DIAGNOSIS — M1990 Unspecified osteoarthritis, unspecified site: Secondary | ICD-10-CM | POA: Diagnosis not present

## 2015-04-03 DIAGNOSIS — G709 Myoneural disorder, unspecified: Secondary | ICD-10-CM | POA: Diagnosis not present

## 2015-04-03 DIAGNOSIS — N183 Chronic kidney disease, stage 3 (moderate): Secondary | ICD-10-CM | POA: Diagnosis not present

## 2015-04-03 DIAGNOSIS — I129 Hypertensive chronic kidney disease with stage 1 through stage 4 chronic kidney disease, or unspecified chronic kidney disease: Secondary | ICD-10-CM | POA: Diagnosis not present

## 2015-04-03 DIAGNOSIS — E119 Type 2 diabetes mellitus without complications: Secondary | ICD-10-CM | POA: Diagnosis not present

## 2015-04-05 DIAGNOSIS — M1990 Unspecified osteoarthritis, unspecified site: Secondary | ICD-10-CM | POA: Diagnosis not present

## 2015-04-05 DIAGNOSIS — I129 Hypertensive chronic kidney disease with stage 1 through stage 4 chronic kidney disease, or unspecified chronic kidney disease: Secondary | ICD-10-CM | POA: Diagnosis not present

## 2015-04-05 DIAGNOSIS — Z471 Aftercare following joint replacement surgery: Secondary | ICD-10-CM | POA: Diagnosis not present

## 2015-04-05 DIAGNOSIS — G709 Myoneural disorder, unspecified: Secondary | ICD-10-CM | POA: Diagnosis not present

## 2015-04-05 DIAGNOSIS — N183 Chronic kidney disease, stage 3 (moderate): Secondary | ICD-10-CM | POA: Diagnosis not present

## 2015-04-05 DIAGNOSIS — E119 Type 2 diabetes mellitus without complications: Secondary | ICD-10-CM | POA: Diagnosis not present

## 2015-04-07 DIAGNOSIS — E119 Type 2 diabetes mellitus without complications: Secondary | ICD-10-CM | POA: Diagnosis not present

## 2015-04-07 DIAGNOSIS — G709 Myoneural disorder, unspecified: Secondary | ICD-10-CM | POA: Diagnosis not present

## 2015-04-07 DIAGNOSIS — N183 Chronic kidney disease, stage 3 (moderate): Secondary | ICD-10-CM | POA: Diagnosis not present

## 2015-04-07 DIAGNOSIS — I129 Hypertensive chronic kidney disease with stage 1 through stage 4 chronic kidney disease, or unspecified chronic kidney disease: Secondary | ICD-10-CM | POA: Diagnosis not present

## 2015-04-07 DIAGNOSIS — Z471 Aftercare following joint replacement surgery: Secondary | ICD-10-CM | POA: Diagnosis not present

## 2015-04-07 DIAGNOSIS — M1990 Unspecified osteoarthritis, unspecified site: Secondary | ICD-10-CM | POA: Diagnosis not present

## 2015-04-12 DIAGNOSIS — M25451 Effusion, right hip: Secondary | ICD-10-CM | POA: Diagnosis not present

## 2015-04-12 DIAGNOSIS — M25651 Stiffness of right hip, not elsewhere classified: Secondary | ICD-10-CM | POA: Diagnosis not present

## 2015-04-12 DIAGNOSIS — M79651 Pain in right thigh: Secondary | ICD-10-CM | POA: Diagnosis not present

## 2015-04-12 DIAGNOSIS — M25551 Pain in right hip: Secondary | ICD-10-CM | POA: Diagnosis not present

## 2015-04-21 DIAGNOSIS — M79651 Pain in right thigh: Secondary | ICD-10-CM | POA: Diagnosis not present

## 2015-04-21 DIAGNOSIS — M25651 Stiffness of right hip, not elsewhere classified: Secondary | ICD-10-CM | POA: Diagnosis not present

## 2015-04-21 DIAGNOSIS — M25551 Pain in right hip: Secondary | ICD-10-CM | POA: Diagnosis not present

## 2015-04-21 DIAGNOSIS — M25451 Effusion, right hip: Secondary | ICD-10-CM | POA: Diagnosis not present

## 2015-08-18 DIAGNOSIS — M1611 Unilateral primary osteoarthritis, right hip: Secondary | ICD-10-CM | POA: Diagnosis not present

## 2015-09-08 DIAGNOSIS — N390 Urinary tract infection, site not specified: Secondary | ICD-10-CM | POA: Diagnosis not present

## 2015-09-08 DIAGNOSIS — R8290 Unspecified abnormal findings in urine: Secondary | ICD-10-CM | POA: Diagnosis not present

## 2015-09-08 DIAGNOSIS — E784 Other hyperlipidemia: Secondary | ICD-10-CM | POA: Diagnosis not present

## 2015-09-08 DIAGNOSIS — I10 Essential (primary) hypertension: Secondary | ICD-10-CM | POA: Diagnosis not present

## 2015-09-08 DIAGNOSIS — Z0000 Encounter for general adult medical examination without abnormal findings: Secondary | ICD-10-CM | POA: Diagnosis not present

## 2015-09-08 DIAGNOSIS — E119 Type 2 diabetes mellitus without complications: Secondary | ICD-10-CM | POA: Diagnosis not present

## 2015-09-15 DIAGNOSIS — Z6833 Body mass index (BMI) 33.0-33.9, adult: Secondary | ICD-10-CM | POA: Diagnosis not present

## 2015-09-15 DIAGNOSIS — E784 Other hyperlipidemia: Secondary | ICD-10-CM | POA: Diagnosis not present

## 2015-09-15 DIAGNOSIS — Z0000 Encounter for general adult medical examination without abnormal findings: Secondary | ICD-10-CM | POA: Diagnosis not present

## 2015-09-15 DIAGNOSIS — Z1389 Encounter for screening for other disorder: Secondary | ICD-10-CM | POA: Diagnosis not present

## 2015-09-15 DIAGNOSIS — F329 Major depressive disorder, single episode, unspecified: Secondary | ICD-10-CM | POA: Diagnosis not present

## 2015-09-15 DIAGNOSIS — E119 Type 2 diabetes mellitus without complications: Secondary | ICD-10-CM | POA: Diagnosis not present

## 2015-09-15 DIAGNOSIS — N183 Chronic kidney disease, stage 3 (moderate): Secondary | ICD-10-CM | POA: Diagnosis not present

## 2015-09-15 DIAGNOSIS — E668 Other obesity: Secondary | ICD-10-CM | POA: Diagnosis not present

## 2015-09-15 DIAGNOSIS — I129 Hypertensive chronic kidney disease with stage 1 through stage 4 chronic kidney disease, or unspecified chronic kidney disease: Secondary | ICD-10-CM | POA: Diagnosis not present

## 2015-09-15 DIAGNOSIS — M1990 Unspecified osteoarthritis, unspecified site: Secondary | ICD-10-CM | POA: Diagnosis not present

## 2015-09-15 DIAGNOSIS — R110 Nausea: Secondary | ICD-10-CM | POA: Diagnosis not present

## 2015-10-25 ENCOUNTER — Other Ambulatory Visit: Payer: Self-pay | Admitting: Internal Medicine

## 2015-10-25 DIAGNOSIS — R319 Hematuria, unspecified: Secondary | ICD-10-CM | POA: Diagnosis not present

## 2015-10-25 DIAGNOSIS — N390 Urinary tract infection, site not specified: Secondary | ICD-10-CM | POA: Diagnosis not present

## 2015-10-27 ENCOUNTER — Other Ambulatory Visit: Payer: Medicare Other

## 2015-10-27 ENCOUNTER — Ambulatory Visit
Admission: RE | Admit: 2015-10-27 | Discharge: 2015-10-27 | Disposition: A | Discharge status: Home / Self Care | Payer: Medicare Other | Source: Ambulatory Visit | Attending: Internal Medicine | Admitting: Internal Medicine

## 2015-10-27 DIAGNOSIS — N200 Calculus of kidney: Secondary | ICD-10-CM | POA: Diagnosis not present

## 2015-10-27 DIAGNOSIS — R319 Hematuria, unspecified: Secondary | ICD-10-CM

## 2015-11-20 DIAGNOSIS — N200 Calculus of kidney: Secondary | ICD-10-CM | POA: Diagnosis not present

## 2015-11-20 DIAGNOSIS — R310 Gross hematuria: Secondary | ICD-10-CM | POA: Diagnosis not present

## 2015-12-09 ENCOUNTER — Encounter (INDEPENDENT_AMBULATORY_CARE_PROVIDER_SITE_OTHER): Payer: Self-pay

## 2016-01-16 DIAGNOSIS — Z794 Long term (current) use of insulin: Secondary | ICD-10-CM | POA: Diagnosis not present

## 2016-01-16 DIAGNOSIS — E1165 Type 2 diabetes mellitus with hyperglycemia: Secondary | ICD-10-CM | POA: Diagnosis not present

## 2016-01-16 DIAGNOSIS — N183 Chronic kidney disease, stage 3 (moderate): Secondary | ICD-10-CM | POA: Diagnosis not present

## 2016-01-16 DIAGNOSIS — I700 Atherosclerosis of aorta: Secondary | ICD-10-CM | POA: Diagnosis not present

## 2016-01-16 DIAGNOSIS — Z23 Encounter for immunization: Secondary | ICD-10-CM | POA: Diagnosis not present

## 2016-01-16 DIAGNOSIS — I10 Essential (primary) hypertension: Secondary | ICD-10-CM | POA: Diagnosis not present

## 2016-02-07 DIAGNOSIS — I10 Essential (primary) hypertension: Secondary | ICD-10-CM | POA: Diagnosis not present

## 2016-02-07 DIAGNOSIS — N183 Chronic kidney disease, stage 3 (moderate): Secondary | ICD-10-CM | POA: Diagnosis not present

## 2016-02-07 DIAGNOSIS — E1165 Type 2 diabetes mellitus with hyperglycemia: Secondary | ICD-10-CM | POA: Diagnosis not present

## 2016-02-07 DIAGNOSIS — Z6835 Body mass index (BMI) 35.0-35.9, adult: Secondary | ICD-10-CM | POA: Diagnosis not present

## 2016-02-16 DIAGNOSIS — I10 Essential (primary) hypertension: Secondary | ICD-10-CM | POA: Diagnosis not present

## 2016-02-16 DIAGNOSIS — E1165 Type 2 diabetes mellitus with hyperglycemia: Secondary | ICD-10-CM | POA: Diagnosis not present

## 2016-02-16 DIAGNOSIS — Z794 Long term (current) use of insulin: Secondary | ICD-10-CM | POA: Diagnosis not present

## 2016-02-16 DIAGNOSIS — Z6836 Body mass index (BMI) 36.0-36.9, adult: Secondary | ICD-10-CM | POA: Diagnosis not present

## 2016-02-19 ENCOUNTER — Encounter (INDEPENDENT_AMBULATORY_CARE_PROVIDER_SITE_OTHER): Payer: Self-pay

## 2016-02-19 ENCOUNTER — Encounter (INDEPENDENT_AMBULATORY_CARE_PROVIDER_SITE_OTHER): Payer: Self-pay | Admitting: Orthopaedic Surgery

## 2016-02-19 ENCOUNTER — Ambulatory Visit (INDEPENDENT_AMBULATORY_CARE_PROVIDER_SITE_OTHER): Payer: Medicare Other

## 2016-02-19 ENCOUNTER — Ambulatory Visit (INDEPENDENT_AMBULATORY_CARE_PROVIDER_SITE_OTHER): Payer: Medicare Other | Admitting: Orthopaedic Surgery

## 2016-02-19 DIAGNOSIS — G8929 Other chronic pain: Secondary | ICD-10-CM | POA: Diagnosis not present

## 2016-02-19 DIAGNOSIS — M25551 Pain in right hip: Secondary | ICD-10-CM | POA: Diagnosis not present

## 2016-02-19 DIAGNOSIS — M25562 Pain in left knee: Secondary | ICD-10-CM

## 2016-02-19 NOTE — Progress Notes (Signed)
Office Visit Note   Patient: Cassandra Dodson           Date of Birth: 1941/04/03           MRN: [id number] Visit Date: 02/19/2016              Requested by: Shon Baton, MD [address] [place], [place] [address] PCP: Precious Reel, MD   Assessment & Plan: Visit Diagnoses:  1. Pain in right hip   2. Chronic pain of left knee     Plan:  - right hip replacement is doing great, very happy, f/u 1 year with repeat ap pelvis - left knee DJD is advanced, will wait for DM to be better controlled before TKA, she'll let us know when she gets her repeat A1c in Jan  Follow-Up Instructions: Return if symptoms worsen or fail to improve.   Orders:  Orders Placed This Encounter  Procedures  . XR Pelvis 1-2 Views  . XR KNEE 3 VIEW LEFT   No orders of the defined types were placed in this encounter.     Procedures: No procedures performed   Clinical Data: No additional findings.   Subjective: Chief Complaint  Patient presents with  . Right Hip - Routine Post Op, Follow-up  . Left Knee - Pain, Follow-up    Comes in today for 1 year f/u for right THA and left knee DJD.  Right hip is doing very well.  Very happy.  No pain.  Left knee is bothering her severely.  Hurts to ambulate, walks with cane and limp, chronic throbbing pain.  Doesn't radiate.  Takes aleve for pain.  Her blood sugars spiked up and A1c went up to 10 but has come down to 8.  Supposed to get repeat one in January with Dr. Virgina Jock.  The knee feels like it wants to give out.      Review of Systems  Constitutional: Negative.   HENT: Negative.   Eyes: Negative.   Respiratory: Negative.   Cardiovascular: Negative.   Endocrine: Negative.   Musculoskeletal: Negative.   Neurological: Negative.   Hematological: Negative.   Psychiatric/Behavioral: Negative.   All other systems reviewed and are negative.    Objective: Vital Signs: There were no vitals taken for this visit.  Physical Exam  Constitutional: She is  oriented to person, place, and time. She appears well-developed and well-nourished.  HENT:  Head: Atraumatic.  Eyes: EOM are normal.  Neck: Neck supple.  Cardiovascular: Intact distal pulses.   Pulmonary/Chest: Effort normal.  Abdominal: Soft.  Musculoskeletal:       Left knee: She exhibits no effusion.  Neurological: She is alert and oriented to person, place, and time.  Skin: Skin is warm. Capillary refill takes less than 2 seconds.  Psychiatric: She has a normal mood and affect. Her behavior is normal. Judgment and thought content normal.  Nursing note and vitals reviewed.   Left Knee Exam   Tenderness  The patient is experiencing tenderness in the lateral joint line.  Range of Motion  The patient has normal left knee ROM.  Other  Sensation: normal Pulse: present Effusion: no effusion present   Right Hip Exam   Comments:  Well healed surgical scar      Specialty Comments:  No specialty comments available.  Imaging: No results found.   PMFS History: Patient Active Problem List   Diagnosis Date Noted  . 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 Hip arthritis 02/23/2015   Past Medical  History:  Diagnosis Date  . Arthritis   . CKD (chronic kidney disease), stage III   . Depression   . Diabetes mellitus without complication ([place])   . GERD (gastroesophageal reflux disease)   . Hypertension   . Hypothyroidism   . Neuromuscular disorder (HCC)    neuropathy in feet  . PONV (postoperative nausea and vomiting)    severe nausea and vomiting after knee replacement  . Urinary frequency     No family history on file.  Past Surgical History:  Procedure Laterality Date  . BREAST SURGERY     cyst removed  . CESAREAN SECTION    . IRRIGATION AND DEBRIDEMENT SEBACEOUS CYST    . JOINT REPLACEMENT Right    right  . teeth extration    . TONSILLECTOMY    . TOTAL HIP ARTHROPLASTY Right 02/23/2015   Procedure: RIGHT TOTAL HIP ARTHROPLASTY ANTERIOR APPROACH;   Surgeon: Leandrew Koyanagi, MD;  Location: [facility];  Service: Orthopedics;  Laterality: Right;   Social History   Occupational History  . Not on file.   Social History Main Topics  . Smoking status: Former Smoker    Packs/day: 1.50    Years: 45.00    Types: Cigarettes    Quit date: 04/22/2002  . Smokeless tobacco: Never Used  . Alcohol use Yes     Comment: rarely  . Drug use: No  . Sexual activity: Not on file

## 2016-04-03 DIAGNOSIS — I10 Essential (primary) hypertension: Secondary | ICD-10-CM | POA: Diagnosis not present

## 2016-04-03 DIAGNOSIS — E1165 Type 2 diabetes mellitus with hyperglycemia: Secondary | ICD-10-CM | POA: Diagnosis not present

## 2016-04-03 DIAGNOSIS — N183 Chronic kidney disease, stage 3 (moderate): Secondary | ICD-10-CM | POA: Diagnosis not present

## 2016-04-30 ENCOUNTER — Ambulatory Visit (INDEPENDENT_AMBULATORY_CARE_PROVIDER_SITE_OTHER): Payer: Medicare Other

## 2016-04-30 ENCOUNTER — Encounter (INDEPENDENT_AMBULATORY_CARE_PROVIDER_SITE_OTHER): Payer: Self-pay

## 2016-04-30 ENCOUNTER — Encounter (INDEPENDENT_AMBULATORY_CARE_PROVIDER_SITE_OTHER): Payer: Self-pay | Admitting: Orthopaedic Surgery

## 2016-04-30 ENCOUNTER — Ambulatory Visit (INDEPENDENT_AMBULATORY_CARE_PROVIDER_SITE_OTHER): Payer: Medicare Other | Admitting: Orthopaedic Surgery

## 2016-04-30 DIAGNOSIS — M1712 Unilateral primary osteoarthritis, left knee: Secondary | ICD-10-CM | POA: Diagnosis not present

## 2016-04-30 DIAGNOSIS — M1611 Unilateral primary osteoarthritis, right hip: Secondary | ICD-10-CM | POA: Diagnosis not present

## 2016-04-30 DIAGNOSIS — M25561 Pain in right knee: Secondary | ICD-10-CM

## 2016-04-30 LAB — HEMOGLOBIN A1C
Hgb A1c MFr Bld: 7 % — ABNORMAL HIGH (ref <5.7)
Mean Plasma Glucose: 154 mg/dL

## 2016-04-30 NOTE — Progress Notes (Signed)
Office Visit Note   Patient: Cassandra Dodson           Date of Birth: [birth year]           MRN: [id number] Visit Date: 04/30/2016              Requested by: Shon Baton, MD [address] [place], [place] [address] PCP: Precious Reel, MD   Assessment & Plan: Visit Diagnoses:  1. Primary osteoarthritis of right hip   2. Unilateral primary osteoarthritis, left knee     Plan: Impression is end stage degenerative joint disease left knee. We obtained a hemoglobin A1c today to evaluate her blood glucose control. Once we have the results we will move forward with scheduling total knee replacement if appropriate.  Follow-Up Instructions: No Follow-up on file.   Orders:  Orders Placed This Encounter  Procedures  . XR Pelvis 1-2 Views  . XR KNEE 3 VIEW LEFT  . Hemoglobin A1C   No orders of the defined types were placed in this encounter.     Procedures: No procedures performed   Clinical Data: No additional findings.   Subjective: Chief Complaint  Patient presents with  . Right Hip - Pain, Follow-up  . Left Knee - Pain, Follow-up    Patient comes back today for mainly review of her left knee degenerative joint disease. Her hemoglobin A1c was 7.5 before Christmas but then she does move it to not adhering to a diabetic diet over the holidays. She is taking Aleve. The pain is severe. Her right hip is doing well and has no complaints.    Review of Systems Complete review of systems negative except for history of present illness  Objective: Vital Signs: There were no vitals taken for this visit.  Physical Exam  Ortho Exam Exam of the left knee is stable. There is no joint effusion. Specialty Comments:  No specialty comments available.  Imaging: Xr Knee 3 View Left  Result Date: 04/30/2016 Advanced degenerative joint disease  Xr Pelvis 1-2 Views  Result Date: 04/30/2016 Stable right hip replacement    PMFS History: Patient Active Problem List   Diagnosis Date  Noted  . Unilateral primary osteoarthritis, left knee 04/30/2016  . 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 Hip arthritis 02/23/2015   Past Medical History:  Diagnosis Date  . Arthritis   . CKD (chronic kidney disease), stage III   . Depression   . Diabetes mellitus without complication ([place])   . GERD (gastroesophageal reflux disease)   . Hypertension   . Hypothyroidism   . Neuromuscular disorder (HCC)    neuropathy in feet  . PONV (postoperative nausea and vomiting)    severe nausea and vomiting after knee replacement  . Urinary frequency     No family history on file.  Past Surgical History:  Procedure Laterality Date  . BREAST SURGERY     cyst removed  . CESAREAN SECTION    . IRRIGATION AND DEBRIDEMENT SEBACEOUS CYST    . JOINT REPLACEMENT Right    right  . teeth extration    . TONSILLECTOMY    . TOTAL HIP ARTHROPLASTY Right 02/23/2015   Procedure: RIGHT TOTAL HIP ARTHROPLASTY ANTERIOR APPROACH;  Surgeon: Leandrew Koyanagi, MD;  Location: [facility];  Service: Orthopedics;  Laterality: Right;   Social History   Occupational History  . Not on file.   Social History Main Topics  . Smoking status: Former Smoker    Packs/day: 1.50    Years: 45.00  Types: Cigarettes    Quit date: 04/22/2002  . Smokeless tobacco: Never Used  . Alcohol use Yes     Comment: rarely  . Drug use: No  . Sexual activity: Not on file

## 2016-06-13 ENCOUNTER — Other Ambulatory Visit (INDEPENDENT_AMBULATORY_CARE_PROVIDER_SITE_OTHER): Payer: Self-pay | Admitting: Orthopaedic Surgery

## 2016-06-13 DIAGNOSIS — M1712 Unilateral primary osteoarthritis, left knee: Secondary | ICD-10-CM

## 2016-06-19 ENCOUNTER — Encounter (HOSPITAL_COMMUNITY): Payer: Self-pay

## 2016-06-19 ENCOUNTER — Encounter (HOSPITAL_COMMUNITY)
Admission: RE | Admit: 2016-06-19 | Discharge: 2016-06-19 | Disposition: A | Discharge status: Home / Self Care | Payer: Medicare Other | Source: Ambulatory Visit | Attending: Orthopaedic Surgery | Admitting: Orthopaedic Surgery

## 2016-06-19 DIAGNOSIS — Z01818 Encounter for other preprocedural examination: Secondary | ICD-10-CM | POA: Diagnosis present

## 2016-06-19 DIAGNOSIS — Z01812 Encounter for preprocedural laboratory examination: Secondary | ICD-10-CM | POA: Insufficient documentation

## 2016-06-19 DIAGNOSIS — Z01810 Encounter for preprocedural cardiovascular examination: Secondary | ICD-10-CM | POA: Insufficient documentation

## 2016-06-19 DIAGNOSIS — M1712 Unilateral primary osteoarthritis, left knee: Secondary | ICD-10-CM | POA: Diagnosis not present

## 2016-06-19 DIAGNOSIS — I10 Essential (primary) hypertension: Secondary | ICD-10-CM | POA: Diagnosis not present

## 2016-06-19 HISTORY — DX: Headache, unspecified: R51.9

## 2016-06-19 HISTORY — DX: Presence of spectacles and contact lenses: Z97.3

## 2016-06-19 HISTORY — DX: Unspecified osteoarthritis, unspecified site: M19.90

## 2016-06-19 HISTORY — DX: Headache: R51

## 2016-06-19 LAB — COMPREHENSIVE METABOLIC PANEL
ALT: 16 U/L (ref 14–54)
AST: 21 U/L (ref 15–41)
Albumin: 4 g/dL (ref 3.5–5.0)
Alkaline Phosphatase: 49 U/L (ref 38–126)
Anion gap: 12 (ref 5–15)
BILIRUBIN TOTAL: 0.5 mg/dL (ref 0.3–1.2)
BUN: 26 mg/dL — AB (ref 6–20)
CHLORIDE: 100 mmol/L — AB (ref 101–111)
CO2: 24 mmol/L (ref 22–32)
CREATININE: 1.42 mg/dL — AB (ref 0.44–1.00)
Calcium: 10.4 mg/dL — ABNORMAL HIGH (ref 8.9–10.3)
GFR calc Af Amer: 40 mL/min — ABNORMAL LOW (ref >60)
GFR calc non Af Amer: 35 mL/min — ABNORMAL LOW (ref >60)
Glucose, Bld: 100 mg/dL — ABNORMAL HIGH (ref 65–99)
Potassium: 3.9 mmol/L (ref 3.5–5.1)
Sodium: 136 mmol/L (ref 135–145)
Total Protein: 6.6 g/dL (ref 6.5–8.1)

## 2016-06-19 LAB — URINALYSIS, ROUTINE W REFLEX MICROSCOPIC
BACTERIA UA: NONE SEEN
Bilirubin Urine: NEGATIVE
GLUCOSE, UA: NEGATIVE mg/dL
HGB URINE DIPSTICK: NEGATIVE
Ketones, ur: NEGATIVE mg/dL
NITRITE: NEGATIVE
PH: 5 (ref 5.0–8.0)
Protein, ur: NEGATIVE mg/dL
SPECIFIC GRAVITY, URINE: 1.014 (ref 1.005–1.030)

## 2016-06-19 LAB — CBC WITH DIFFERENTIAL/PLATELET
BASOS ABS: 0.1 10*3/uL (ref 0.0–0.1)
Basophils Relative: 1 %
Eosinophils Absolute: 0.3 10*3/uL (ref 0.0–0.7)
Eosinophils Relative: 3 %
HEMATOCRIT: 37.1 % (ref 36.0–46.0)
HEMOGLOBIN: 11.9 g/dL — AB (ref 12.0–15.0)
LYMPHS PCT: 28 %
Lymphs Abs: 2.7 10*3/uL (ref 0.7–4.0)
MCH: 27.6 pg (ref 26.0–34.0)
MCHC: 32.1 g/dL (ref 30.0–36.0)
MCV: 86.1 fL (ref 78.0–100.0)
Monocytes Absolute: 0.6 10*3/uL (ref 0.1–1.0)
Monocytes Relative: 6 %
NEUTROS ABS: 6 10*3/uL (ref 1.7–7.7)
Neutrophils Relative %: 62 %
Platelets: 267 10*3/uL (ref 150–400)
RBC: 4.31 MIL/uL (ref 3.87–5.11)
RDW: 16.2 % — ABNORMAL HIGH (ref 11.5–15.5)
WBC: 9.7 10*3/uL (ref 4.0–10.5)

## 2016-06-19 LAB — TYPE AND SCREEN
ABO/RH(D): O POS
Antibody Screen: NEGATIVE

## 2016-06-19 LAB — GLUCOSE, CAPILLARY: Glucose-Capillary: 94 mg/dL (ref 65–99)

## 2016-06-19 LAB — C-REACTIVE PROTEIN: CRP: <0.8 mg/dL (ref <1.0)

## 2016-06-19 LAB — PROTIME-INR
INR: 1.12
PROTHROMBIN TIME: 14.4 s (ref 11.4–15.2)

## 2016-06-19 LAB — SURGICAL PCR SCREEN
MRSA, PCR: NEGATIVE
Staphylococcus aureus: POSITIVE — AB

## 2016-06-19 LAB — APTT: APTT: 27 s (ref 24–36)

## 2016-06-19 LAB — SEDIMENTATION RATE: Sed Rate: 6 mm/hr (ref 0–22)

## 2016-06-19 NOTE — Pre-Procedure Instructions (Signed)
Cassandra Dodson  06/19/2016      [id number] - [address] [place] [address] Phone: [phone] Fax: [phone]    Your procedure is scheduled on Thursday, June 27, 2016.  Report to [facility] Admitting at 10:15 A.M.  Call this number if you have problems the morning of surgery:  [phone]   Remember:  Do not eat food or drink liquids after midnight Wednesday, June 26, 2016  Take these medicines the morning of surgery with A SIP OF WATER : levothyroxine (SYNTHROID),  If needed:oxyCODONE for pain Stop taking Aspirin, vitamins, fish oil and herbal medications. Do not take any NSAIDs ie: Ibuprofen, Advil, Naproxen, BC and Goody Powder or any medication containing Aspirin; stop Thursday, June 20, 2016.    How to Manage Your Diabetes Before and After Surgery  Why is it important to control my blood sugar before and after surgery? . Improving blood sugar levels before and after surgery helps healing and can limit problems. . A way of improving blood sugar control is eating a healthy diet by: o  Eating less sugar and carbohydrates o  Increasing activity/exercise o  Talking with your doctor about reaching your blood sugar goals . High blood sugars (greater than 180 mg/dL) can raise your risk of infections and slow your recovery, so you will need to focus on controlling your diabetes during the weeks before surgery. . Make sure that the doctor who takes care of your diabetes knows about your planned surgery including the date and location.  How do I manage my blood sugar before surgery? . Check your blood sugar at least 4 times a day, starting 2 days before surgery, to make sure that the level is not too high or low. o Check your blood sugar the morning of your surgery when you wake up and every 2 hours until you get to the Short Stay unit. . If your blood sugar is  less than 70 mg/dL, you will need to treat for low blood sugar: o Do not take insulin. o Treat a low blood sugar (less than 70 mg/dL) with  cup of clear juice (cranberry or apple), 4 glucose tablets, OR glucose gel. o Recheck blood sugar in 15 minutes after treatment (to make sure it is greater than 70 mg/dL). If your blood sugar is not greater than 70 mg/dL on recheck, call [phone] for further instructions. . Report your blood sugar to the short stay nurse when you get to Short Stay.  . If you are admitted to the hospital after surgery: o Your blood sugar will be checked by the staff and you will probably be given insulin after surgery (instead of oral diabetes medicines) to make sure you have good blood sugar levels. o The goal for blood sugar control after surgery is 80-180 mg/dL.  WHAT DO I DO ABOUT MY DIABETES MEDICATION?   Marland Kitchen Do not take oral diabetes medicines (pills) the morning of surgery such as metFORMIN (GLUCOPHAGE)  . THE NIGHT BEFORE SURGERY, take 12 units of HUMALOG MIX 75/25    Patient Signature:  Date:   Nurse Signature:  Date:   Reviewed and Endorsed by [facility] Patient Education Committee, August 2015  Do not wear jewelry, make-up or nail polish.  Do not wear lotions, powders, or perfumes, or deoderant.  Do not shave 48 hours prior to surgery.  Do not bring valuables to the hospital.  [facility] is not responsible for any belongings or valuables.  Contacts, dentures or bridgework may not be worn into surgery.  Leave your suitcase in the car.  After surgery it may be brought to your room. For patients admitted to the hospital, discharge time will be determined by your treatment team. Patients discharged the day of surgery will not be allowed to drive home.  Special instructions: Shower the night before surgery and the morning of surgery with CHG. Please read over the following fact sheets that you were given. Pain Booklet, Coughing and Deep Breathing,  Blood Transfusion Information, Total Joint Packet, MRSA Information and Surgical Site Infection Prevention

## 2016-06-19 NOTE — Progress Notes (Signed)
Pt denies SOB, chest pain, and being under the care of a cardiologist. Pt stated that a stress test was performed  "10 years ago "  but denies having an echo and cardiac cath.  Pt denies having an EKG and chest x ray within the last year.  Pt denies having labs within the last week. Pt pulse was 108 upon arrival to PAT visit but decreased to 86 prior to leaving appointment. Willeen Cass, NP, Anesthesia, reviewed EKG and made aware of pulse history; no new orders given.

## 2016-06-26 MED ORDER — CEFAZOLIN SODIUM-DEXTROSE 2-4 GM/100ML-% IV SOLN
2.0000 g | INTRAVENOUS | Status: AC
  Administered 2016-06-27: 2 g via INTRAVENOUS
  Filled 2016-06-26: qty 100

## 2016-06-26 MED ORDER — BUPIVACAINE LIPOSOME 1.3 % IJ SUSP
20.0000 mL | INTRAMUSCULAR | Status: AC
  Administered 2016-06-27: 20 mL
  Filled 2016-06-26: qty 20

## 2016-06-26 MED ORDER — TRANEXAMIC ACID 1000 MG/10ML IV SOLN
1000.0000 mg | INTRAVENOUS | Status: AC
  Administered 2016-06-27: 1000 mg via INTRAVENOUS
  Filled 2016-06-26: qty 10

## 2016-06-27 ENCOUNTER — Ambulatory Visit (HOSPITAL_COMMUNITY): Payer: Medicare Other | Admitting: Anesthesiology

## 2016-06-27 ENCOUNTER — Encounter (HOSPITAL_COMMUNITY)
Admission: AD | Disposition: A | Discharge status: Skilled Nursing Facility | Payer: Self-pay | Source: Ambulatory Visit | Attending: Orthopaedic Surgery

## 2016-06-27 ENCOUNTER — Encounter (HOSPITAL_COMMUNITY): Payer: Self-pay | Admitting: Certified Registered Nurse Anesthetist

## 2016-06-27 ENCOUNTER — Inpatient Hospital Stay (HOSPITAL_COMMUNITY): Payer: Medicare Other

## 2016-06-27 ENCOUNTER — Inpatient Hospital Stay (HOSPITAL_COMMUNITY)
Admission: AD | Admit: 2016-06-27 | Discharge: 2016-06-29 | DRG: 470 | Disposition: A | Discharge status: Skilled Nursing Facility | Payer: Medicare Other | Source: Ambulatory Visit | Attending: Orthopaedic Surgery | Admitting: Orthopaedic Surgery

## 2016-06-27 ENCOUNTER — Ambulatory Visit (HOSPITAL_COMMUNITY): Payer: Medicare Other | Admitting: Emergency Medicine

## 2016-06-27 DIAGNOSIS — D62 Acute posthemorrhagic anemia: Secondary | ICD-10-CM | POA: Diagnosis not present

## 2016-06-27 DIAGNOSIS — Z973 Presence of spectacles and contact lenses: Secondary | ICD-10-CM

## 2016-06-27 DIAGNOSIS — Z96659 Presence of unspecified artificial knee joint: Secondary | ICD-10-CM

## 2016-06-27 DIAGNOSIS — M1712 Unilateral primary osteoarthritis, left knee: Secondary | ICD-10-CM | POA: Diagnosis present

## 2016-06-27 DIAGNOSIS — Z87891 Personal history of nicotine dependence: Secondary | ICD-10-CM

## 2016-06-27 DIAGNOSIS — Z96641 Presence of right artificial hip joint: Secondary | ICD-10-CM | POA: Diagnosis present

## 2016-06-27 DIAGNOSIS — Z79891 Long term (current) use of opiate analgesic: Secondary | ICD-10-CM | POA: Diagnosis not present

## 2016-06-27 DIAGNOSIS — E1122 Type 2 diabetes mellitus with diabetic chronic kidney disease: Secondary | ICD-10-CM | POA: Diagnosis present

## 2016-06-27 DIAGNOSIS — Z7982 Long term (current) use of aspirin: Secondary | ICD-10-CM | POA: Diagnosis not present

## 2016-06-27 DIAGNOSIS — E039 Hypothyroidism, unspecified: Secondary | ICD-10-CM | POA: Diagnosis present

## 2016-06-27 DIAGNOSIS — Z888 Allergy status to other drugs, medicaments and biological substances status: Secondary | ICD-10-CM | POA: Diagnosis not present

## 2016-06-27 DIAGNOSIS — E1140 Type 2 diabetes mellitus with diabetic neuropathy, unspecified: Secondary | ICD-10-CM | POA: Diagnosis present

## 2016-06-27 DIAGNOSIS — Z823 Family history of stroke: Secondary | ICD-10-CM

## 2016-06-27 DIAGNOSIS — Z6836 Body mass index (BMI) 36.0-36.9, adult: Secondary | ICD-10-CM

## 2016-06-27 DIAGNOSIS — K219 Gastro-esophageal reflux disease without esophagitis: Secondary | ICD-10-CM | POA: Diagnosis present

## 2016-06-27 DIAGNOSIS — I129 Hypertensive chronic kidney disease with stage 1 through stage 4 chronic kidney disease, or unspecified chronic kidney disease: Secondary | ICD-10-CM | POA: Diagnosis present

## 2016-06-27 DIAGNOSIS — Z794 Long term (current) use of insulin: Secondary | ICD-10-CM | POA: Diagnosis not present

## 2016-06-27 DIAGNOSIS — Z79899 Other long term (current) drug therapy: Secondary | ICD-10-CM

## 2016-06-27 DIAGNOSIS — M25762 Osteophyte, left knee: Secondary | ICD-10-CM | POA: Diagnosis present

## 2016-06-27 DIAGNOSIS — N183 Chronic kidney disease, stage 3 (moderate): Secondary | ICD-10-CM | POA: Diagnosis present

## 2016-06-27 DIAGNOSIS — Z972 Presence of dental prosthetic device (complete) (partial): Secondary | ICD-10-CM | POA: Diagnosis not present

## 2016-06-27 HISTORY — PX: TOTAL KNEE ARTHROPLASTY: SHX125

## 2016-06-27 LAB — GLUCOSE, CAPILLARY
GLUCOSE-CAPILLARY: 174 mg/dL — AB (ref 65–99)
GLUCOSE-CAPILLARY: 149 mg/dL — AB (ref 65–99)
GLUCOSE-CAPILLARY: 259 mg/dL — AB (ref 65–99)
Glucose-Capillary: 218 mg/dL — ABNORMAL HIGH (ref 65–99)

## 2016-06-27 SURGERY — ARTHROPLASTY, KNEE, TOTAL
Anesthesia: General | Site: Knee | Laterality: Left

## 2016-06-27 MED ORDER — ACETAMINOPHEN 500 MG PO TABS
1000.0000 mg | ORAL_TABLET | Freq: Four times a day (QID) | ORAL | Status: AC
  Administered 2016-06-27 – 2016-06-28 (×4): 1000 mg via ORAL
  Filled 2016-06-27 (×4): qty 2

## 2016-06-27 MED ORDER — FENTANYL CITRATE (PF) 100 MCG/2ML IJ SOLN
INTRAMUSCULAR | Status: AC
  Filled 2016-06-27: qty 2

## 2016-06-27 MED ORDER — POLYETHYLENE GLYCOL 3350 17 G PO PACK: 17.0000 g | PACK | Freq: Every day | ORAL | Status: DC | PRN

## 2016-06-27 MED ORDER — PROMETHAZINE HCL 25 MG PO TABS: 25.0000 mg | ORAL_TABLET | Freq: Four times a day (QID) | ORAL | 1 refills | Status: DC | PRN

## 2016-06-27 MED ORDER — TRANEXAMIC ACID 1000 MG/10ML IV SOLN
INTRAVENOUS | Status: DC | PRN
  Administered 2016-06-27: 2000 mg via TOPICAL

## 2016-06-27 MED ORDER — INSULIN ASPART PROT & ASPART (70-30 MIX) 100 UNIT/ML ~~LOC~~ SUSP
18.0000 [IU] | Freq: Two times a day (BID) | SUBCUTANEOUS | Status: DC
  Administered 2016-06-27 – 2016-06-29 (×4): 18 [IU] via SUBCUTANEOUS
  Filled 2016-06-27: qty 10

## 2016-06-27 MED ORDER — MIDAZOLAM HCL 2 MG/2ML IJ SOLN
INTRAMUSCULAR | Status: AC
  Administered 2016-06-27: 1 mg via INTRAVENOUS
  Filled 2016-06-27: qty 2

## 2016-06-27 MED ORDER — METHOCARBAMOL 1000 MG/10ML IJ SOLN
500.0000 mg | Freq: Four times a day (QID) | INTRAMUSCULAR | Status: DC | PRN
  Filled 2016-06-27: qty 5

## 2016-06-27 MED ORDER — HYDROCHLOROTHIAZIDE 25 MG PO TABS
25.0000 mg | ORAL_TABLET | Freq: Every day | ORAL | Status: DC
  Administered 2016-06-28: 25 mg via ORAL
  Filled 2016-06-27 (×3): qty 1

## 2016-06-27 MED ORDER — ROPIVACAINE HCL 7.5 MG/ML IJ SOLN
INTRAMUSCULAR | Status: DC | PRN
Start: 2016-06-27 — End: 2016-06-27
  Administered 2016-06-27: 20 mL via PERINEURAL

## 2016-06-27 MED ORDER — OXYCODONE HCL 5 MG PO TABS
5.0000 mg | ORAL_TABLET | ORAL | Status: DC | PRN
  Administered 2016-06-27: 5 mg via ORAL
  Administered 2016-06-29 (×2): 10 mg via ORAL
  Filled 2016-06-27 (×2): qty 2

## 2016-06-27 MED ORDER — OXYCODONE HCL ER 10 MG PO T12A
10.0000 mg | EXTENDED_RELEASE_TABLET | Freq: Two times a day (BID) | ORAL | Status: DC
  Administered 2016-06-27 – 2016-06-29 (×4): 10 mg via ORAL
  Filled 2016-06-27 (×4): qty 1

## 2016-06-27 MED ORDER — SUCCINYLCHOLINE CHLORIDE 200 MG/10ML IV SOSY
PREFILLED_SYRINGE | INTRAVENOUS | Status: AC
  Filled 2016-06-27: qty 10

## 2016-06-27 MED ORDER — IRBESARTAN 300 MG PO TABS
300.0000 mg | ORAL_TABLET | Freq: Every day | ORAL | Status: DC
  Administered 2016-06-28: 300 mg via ORAL
  Filled 2016-06-27 (×3): qty 1

## 2016-06-27 MED ORDER — SENNOSIDES-DOCUSATE SODIUM 8.6-50 MG PO TABS: 1.0000 | ORAL_TABLET | Freq: Every evening | ORAL | 1 refills | Status: DC | PRN

## 2016-06-27 MED ORDER — MIDAZOLAM HCL 2 MG/2ML IJ SOLN
1.0000 mg | Freq: Once | INTRAMUSCULAR | Status: AC
  Administered 2016-06-27: 1 mg via INTRAVENOUS

## 2016-06-27 MED ORDER — MORPHINE SULFATE (PF) 2 MG/ML IV SOLN
1.0000 mg | INTRAVENOUS | Status: DC | PRN
  Administered 2016-06-27: 1 mg via INTRAVENOUS
  Filled 2016-06-27: qty 1

## 2016-06-27 MED ORDER — PROPOFOL 10 MG/ML IV BOLUS
INTRAVENOUS | Status: AC
  Filled 2016-06-27: qty 20

## 2016-06-27 MED ORDER — ALUM & MAG HYDROXIDE-SIMETH 200-200-20 MG/5ML PO SUSP: 30.0000 mL | ORAL | Status: DC | PRN

## 2016-06-27 MED ORDER — PHENOL 1.4 % MT LIQD: 1.0000 | OROMUCOSAL | Status: DC | PRN

## 2016-06-27 MED ORDER — 0.9 % SODIUM CHLORIDE (POUR BTL) OPTIME
TOPICAL | Status: DC | PRN
  Administered 2016-06-27: 1000 mL

## 2016-06-27 MED ORDER — SULFAMETHOXAZOLE-TRIMETHOPRIM 800-160 MG PO TABS: 1.0000 | ORAL_TABLET | Freq: Two times a day (BID) | ORAL | 0 refills | Status: DC

## 2016-06-27 MED ORDER — PROMETHAZINE HCL 25 MG/ML IJ SOLN: 6.2500 mg | INTRAMUSCULAR | Status: DC | PRN

## 2016-06-27 MED ORDER — OXYCODONE HCL 5 MG PO TABS
ORAL_TABLET | ORAL | Status: AC
  Filled 2016-06-27: qty 1

## 2016-06-27 MED ORDER — ONDANSETRON HCL 4 MG/2ML IJ SOLN
INTRAMUSCULAR | Status: AC
  Filled 2016-06-27: qty 2

## 2016-06-27 MED ORDER — MAGNESIUM CITRATE PO SOLN: 1.0000 | Freq: Once | ORAL | Status: DC | PRN

## 2016-06-27 MED ORDER — OXYCODONE-ACETAMINOPHEN 5-325 MG PO TABS: 1.0000 | ORAL_TABLET | ORAL | 0 refills | Status: DC | PRN

## 2016-06-27 MED ORDER — FENTANYL CITRATE (PF) 100 MCG/2ML IJ SOLN
INTRAMUSCULAR | Status: AC
  Administered 2016-06-27: 100 ug via INTRAVENOUS
  Filled 2016-06-27: qty 2

## 2016-06-27 MED ORDER — SODIUM CHLORIDE 0.9 % IV SOLN
INTRAVENOUS | Status: DC
  Administered 2016-06-27: 17:00:00 via INTRAVENOUS

## 2016-06-27 MED ORDER — KETOROLAC TROMETHAMINE 15 MG/ML IJ SOLN
30.0000 mg | Freq: Four times a day (QID) | INTRAMUSCULAR | Status: AC
  Administered 2016-06-27 – 2016-06-28 (×4): 30 mg via INTRAVENOUS
  Filled 2016-06-27 (×4): qty 2

## 2016-06-27 MED ORDER — DEXAMETHASONE SODIUM PHOSPHATE 10 MG/ML IJ SOLN
10.0000 mg | Freq: Once | INTRAMUSCULAR | Status: AC
  Administered 2016-06-28: 10 mg via INTRAVENOUS
  Filled 2016-06-27: qty 1

## 2016-06-27 MED ORDER — ACETAMINOPHEN 650 MG RE SUPP: 650.0000 mg | Freq: Four times a day (QID) | RECTAL | Status: DC | PRN

## 2016-06-27 MED ORDER — DIPHENHYDRAMINE HCL 12.5 MG/5ML PO ELIX: 25.0000 mg | ORAL_SOLUTION | ORAL | Status: DC | PRN

## 2016-06-27 MED ORDER — METHOCARBAMOL 500 MG PO TABS: 500.0000 mg | ORAL_TABLET | Freq: Four times a day (QID) | ORAL | Status: DC | PRN

## 2016-06-27 MED ORDER — METHOCARBAMOL 750 MG PO TABS: 750.0000 mg | ORAL_TABLET | Freq: Two times a day (BID) | ORAL | 0 refills | Status: DC | PRN

## 2016-06-27 MED ORDER — MENTHOL 3 MG MT LOZG: 1.0000 | LOZENGE | OROMUCOSAL | Status: DC | PRN

## 2016-06-27 MED ORDER — DEXTROSE 5 % IV SOLN: INTRAVENOUS | Status: DC | PRN

## 2016-06-27 MED ORDER — FENTANYL CITRATE (PF) 100 MCG/2ML IJ SOLN
100.0000 ug | Freq: Once | INTRAMUSCULAR | Status: AC
  Administered 2016-06-27: 100 ug via INTRAVENOUS

## 2016-06-27 MED ORDER — ONDANSETRON HCL 4 MG PO TABS: 4.0000 mg | ORAL_TABLET | Freq: Three times a day (TID) | ORAL | 0 refills | Status: DC | PRN

## 2016-06-27 MED ORDER — SORBITOL 70 % SOLN: 30.0000 mL | Freq: Every day | Status: DC | PRN

## 2016-06-27 MED ORDER — CEFAZOLIN SODIUM-DEXTROSE 2-4 GM/100ML-% IV SOLN
2.0000 g | Freq: Four times a day (QID) | INTRAVENOUS | Status: AC
  Administered 2016-06-27 (×2): 2 g via INTRAVENOUS
  Filled 2016-06-27 (×2): qty 100

## 2016-06-27 MED ORDER — LACTATED RINGERS IV SOLN: INTRAVENOUS | Status: DC

## 2016-06-27 MED ORDER — LEVOTHYROXINE SODIUM 50 MCG PO TABS
50.0000 ug | ORAL_TABLET | Freq: Every evening | ORAL | Status: DC
  Administered 2016-06-27 – 2016-06-28 (×2): 50 ug via ORAL
  Filled 2016-06-27 (×2): qty 1

## 2016-06-27 MED ORDER — POVIDONE-IODINE 10 % EX SOLN
CUTANEOUS | Status: DC | PRN
  Administered 2016-06-27: 1 via TOPICAL

## 2016-06-27 MED ORDER — LIDOCAINE HCL (CARDIAC) 20 MG/ML IV SOLN
INTRAVENOUS | Status: DC | PRN
  Administered 2016-06-27: 50 mg via INTRAVENOUS

## 2016-06-27 MED ORDER — ASPIRIN EC 325 MG PO TBEC
325.0000 mg | DELAYED_RELEASE_TABLET | Freq: Two times a day (BID) | ORAL | Status: DC
  Administered 2016-06-27 – 2016-06-29 (×4): 325 mg via ORAL
  Filled 2016-06-27 (×4): qty 1

## 2016-06-27 MED ORDER — PHENYLEPHRINE 40 MCG/ML (10ML) SYRINGE FOR IV PUSH (FOR BLOOD PRESSURE SUPPORT)
PREFILLED_SYRINGE | INTRAVENOUS | Status: DC | PRN
  Administered 2016-06-27: 80 ug via INTRAVENOUS
  Administered 2016-06-27: 40 ug via INTRAVENOUS

## 2016-06-27 MED ORDER — METOCLOPRAMIDE HCL 5 MG/ML IJ SOLN: 5.0000 mg | Freq: Three times a day (TID) | INTRAMUSCULAR | Status: DC | PRN

## 2016-06-27 MED ORDER — PROPOFOL 10 MG/ML IV BOLUS
INTRAVENOUS | Status: DC | PRN
  Administered 2016-06-27: 150 mg via INTRAVENOUS
  Administered 2016-06-27 (×2): 30 mg via INTRAVENOUS

## 2016-06-27 MED ORDER — LIDOCAINE 2% (20 MG/ML) 5 ML SYRINGE
INTRAMUSCULAR | Status: AC
  Filled 2016-06-27: qty 5

## 2016-06-27 MED ORDER — INSULIN ASPART 100 UNIT/ML ~~LOC~~ SOLN
0.0000 [IU] | Freq: Every day | SUBCUTANEOUS | Status: DC
  Administered 2016-06-27: 3 [IU] via SUBCUTANEOUS

## 2016-06-27 MED ORDER — ONDANSETRON HCL 4 MG/2ML IJ SOLN
4.0000 mg | Freq: Four times a day (QID) | INTRAMUSCULAR | Status: DC | PRN
  Administered 2016-06-29: 4 mg via INTRAVENOUS
  Filled 2016-06-27: qty 2

## 2016-06-27 MED ORDER — CHLORHEXIDINE GLUCONATE 4 % EX LIQD: 60.0000 mL | Freq: Once | CUTANEOUS | Status: DC

## 2016-06-27 MED ORDER — ONDANSETRON HCL 4 MG/2ML IJ SOLN
INTRAMUSCULAR | Status: DC | PRN
  Administered 2016-06-27 (×2): 4 mg via INTRAVENOUS

## 2016-06-27 MED ORDER — PHENYLEPHRINE HCL 10 MG/ML IJ SOLN: INTRAMUSCULAR | Status: DC | PRN

## 2016-06-27 MED ORDER — MEPERIDINE HCL 25 MG/ML IJ SOLN: 6.2500 mg | INTRAMUSCULAR | Status: DC | PRN

## 2016-06-27 MED ORDER — METOCLOPRAMIDE HCL 5 MG PO TABS: 5.0000 mg | ORAL_TABLET | Freq: Three times a day (TID) | ORAL | Status: DC | PRN

## 2016-06-27 MED ORDER — DEXAMETHASONE SODIUM PHOSPHATE 10 MG/ML IJ SOLN
INTRAMUSCULAR | Status: DC | PRN
  Administered 2016-06-27: 4 mg via INTRAVENOUS

## 2016-06-27 MED ORDER — SODIUM CHLORIDE 0.9 % IJ SOLN
INTRAMUSCULAR | Status: DC | PRN
  Administered 2016-06-27: 40 mL via INTRAVENOUS

## 2016-06-27 MED ORDER — LACTATED RINGERS IV SOLN
INTRAVENOUS | Status: DC
  Administered 2016-06-27 (×2): via INTRAVENOUS

## 2016-06-27 MED ORDER — ACETAMINOPHEN 500 MG PO TABS
1000.0000 mg | ORAL_TABLET | Freq: Once | ORAL | Status: AC
  Administered 2016-06-27: 1000 mg via ORAL

## 2016-06-27 MED ORDER — TRANEXAMIC ACID 1000 MG/10ML IV SOLN
2000.0000 mg | Freq: Once | INTRAVENOUS | Status: DC
  Filled 2016-06-27: qty 20

## 2016-06-27 MED ORDER — ADULT MULTIVITAMIN W/MINERALS CH
1.0000 | ORAL_TABLET | Freq: Every day | ORAL | Status: DC
  Administered 2016-06-27 – 2016-06-29 (×3): 1 via ORAL
  Filled 2016-06-27 (×3): qty 1

## 2016-06-27 MED ORDER — ACETAMINOPHEN 500 MG PO TABS
ORAL_TABLET | ORAL | Status: AC
  Filled 2016-06-27: qty 2

## 2016-06-27 MED ORDER — INSULIN LISPRO PROT & LISPRO (75-25 MIX) 100 UNIT/ML KWIKPEN: 18.0000 [IU] | PEN_INJECTOR | Freq: Two times a day (BID) | SUBCUTANEOUS | Status: DC

## 2016-06-27 MED ORDER — GABAPENTIN 300 MG PO CAPS
300.0000 mg | ORAL_CAPSULE | Freq: Once | ORAL | Status: AC
  Administered 2016-06-27: 300 mg via ORAL

## 2016-06-27 MED ORDER — SUCCINYLCHOLINE CHLORIDE 200 MG/10ML IV SOSY
PREFILLED_SYRINGE | INTRAVENOUS | Status: DC | PRN
  Administered 2016-06-27: 100 mg via INTRAVENOUS

## 2016-06-27 MED ORDER — ACETAMINOPHEN 325 MG PO TABS
650.0000 mg | ORAL_TABLET | Freq: Four times a day (QID) | ORAL | Status: DC | PRN
  Filled 2016-06-27: qty 2

## 2016-06-27 MED ORDER — GABAPENTIN 300 MG PO CAPS
ORAL_CAPSULE | ORAL | Status: AC
  Filled 2016-06-27: qty 1

## 2016-06-27 MED ORDER — DEXAMETHASONE SODIUM PHOSPHATE 10 MG/ML IJ SOLN
INTRAMUSCULAR | Status: AC
  Filled 2016-06-27: qty 1

## 2016-06-27 MED ORDER — INSULIN ASPART 100 UNIT/ML ~~LOC~~ SOLN
0.0000 [IU] | Freq: Three times a day (TID) | SUBCUTANEOUS | Status: DC
  Administered 2016-06-27: 7 [IU] via SUBCUTANEOUS
  Administered 2016-06-28: 15 [IU] via SUBCUTANEOUS
  Administered 2016-06-28 – 2016-06-29 (×3): 4 [IU] via SUBCUTANEOUS

## 2016-06-27 MED ORDER — FENTANYL CITRATE (PF) 100 MCG/2ML IJ SOLN
INTRAMUSCULAR | Status: DC | PRN
  Administered 2016-06-27: 100 ug via INTRAVENOUS
  Administered 2016-06-27: 25 ug via INTRAVENOUS
  Administered 2016-06-27 (×2): 50 ug via INTRAVENOUS
  Administered 2016-06-27: 25 ug via INTRAVENOUS
  Administered 2016-06-27: 50 ug via INTRAVENOUS

## 2016-06-27 MED ORDER — TELMISARTAN-HCTZ 80-25 MG PO TABS: 1.0000 | ORAL_TABLET | Freq: Every day | ORAL | Status: DC

## 2016-06-27 MED ORDER — FENOFIBRATE 160 MG PO TABS
160.0000 mg | ORAL_TABLET | Freq: Every day | ORAL | Status: DC
Start: 2016-06-27 — End: 2016-06-29
  Administered 2016-06-27 – 2016-06-29 (×3): 160 mg via ORAL
  Filled 2016-06-27 (×3): qty 1

## 2016-06-27 MED ORDER — ONDANSETRON HCL 4 MG PO TABS: 4.0000 mg | ORAL_TABLET | Freq: Four times a day (QID) | ORAL | Status: DC | PRN

## 2016-06-27 MED ORDER — FENTANYL CITRATE (PF) 100 MCG/2ML IJ SOLN
25.0000 ug | INTRAMUSCULAR | Status: DC | PRN
  Administered 2016-06-27 (×2): 50 ug via INTRAVENOUS

## 2016-06-27 MED ORDER — TRANEXAMIC ACID 1000 MG/10ML IV SOLN
1000.0000 mg | Freq: Once | INTRAVENOUS | Status: AC
  Administered 2016-06-27: 1000 mg via INTRAVENOUS
  Filled 2016-06-27: qty 10

## 2016-06-27 SURGICAL SUPPLY — 70 items
ALCOHOL ISOPROPYL (RUBBING) (MISCELLANEOUS) ×2 IMPLANT
APL SKNCLS STERI-STRIP NONHPOA (GAUZE/BANDAGES/DRESSINGS) ×1
BAG DECANTER FOR FLEXI CONT (MISCELLANEOUS) ×2 IMPLANT
BANDAGE ACE 4X5 VEL STRL LF (GAUZE/BANDAGES/DRESSINGS) ×1 IMPLANT
BANDAGE ACE 6X5 VEL STRL LF (GAUZE/BANDAGES/DRESSINGS) ×4 IMPLANT
BANDAGE ELASTIC 6 VELCRO ST LF (GAUZE/BANDAGES/DRESSINGS) ×1 IMPLANT
BANDAGE ESMARK 6X9 LF (GAUZE/BANDAGES/DRESSINGS) ×1 IMPLANT
BENZOIN TINCTURE PRP APPL 2/3 (GAUZE/BANDAGES/DRESSINGS) ×2 IMPLANT
BLADE SAW SGTL 13.0X1.19X90.0M (BLADE) ×2 IMPLANT
BNDG CMPR 9X6 STRL LF SNTH (GAUZE/BANDAGES/DRESSINGS) ×1
BNDG ESMARK 6X9 LF (GAUZE/BANDAGES/DRESSINGS) ×2
BONE CEMENT PALACOS R-G (Cement) ×4 IMPLANT
BOWL SMART MIX CTS (DISPOSABLE) ×2 IMPLANT
CAPT KNEE TOTAL 3 ×1 IMPLANT
CEMENT BONE PALACOS R-G (Cement) ×2 IMPLANT
CLSR STERI-STRIP ANTIMIC 1/2X4 (GAUZE/BANDAGES/DRESSINGS) ×4 IMPLANT
COVER SURGICAL LIGHT HANDLE (MISCELLANEOUS) ×2 IMPLANT
CUFF TOURNIQUET SINGLE 34IN LL (TOURNIQUET CUFF) ×1 IMPLANT
CUFF TOURNIQUET SINGLE 44IN (TOURNIQUET CUFF) ×1 IMPLANT
DRAPE EXTREMITY T 121X128X90 (DRAPE) ×2 IMPLANT
DRAPE INCISE IOBAN 66X45 STRL (DRAPES) IMPLANT
DRAPE ORTHO SPLIT 77X108 STRL (DRAPES) ×4
DRAPE PROXIMA HALF (DRAPES) ×2 IMPLANT
DRAPE SURG 17X11 SM STRL (DRAPES) ×4 IMPLANT
DRAPE SURG ORHT 6 SPLT 77X108 (DRAPES) ×2 IMPLANT
DRSG AQUACEL AG ADV 3.5X10 (GAUZE/BANDAGES/DRESSINGS) ×1 IMPLANT
DRSG AQUACEL AG ADV 3.5X14 (GAUZE/BANDAGES/DRESSINGS) ×1 IMPLANT
DURAPREP 26ML APPLICATOR (WOUND CARE) ×5 IMPLANT
ELECT CAUTERY BLADE 6.4 (BLADE) ×2 IMPLANT
ELECT REM PT RETURN 9FT ADLT (ELECTROSURGICAL) ×2
ELECTRODE REM PT RTRN 9FT ADLT (ELECTROSURGICAL) ×1 IMPLANT
GLOVE SKINSENSE NS SZ7.5 (GLOVE) ×1
GLOVE SKINSENSE STRL SZ7.5 (GLOVE) ×1 IMPLANT
GLOVE SURG SYN 7.5  E (GLOVE) ×4
GLOVE SURG SYN 7.5 E (GLOVE) ×4 IMPLANT
GLOVE SURG SYN 7.5 PF PI (GLOVE) ×4 IMPLANT
GOWN STRL REIN XL XLG (GOWN DISPOSABLE) ×2 IMPLANT
GOWN STRL REUS W/ TWL LRG LVL3 (GOWN DISPOSABLE) ×1 IMPLANT
GOWN STRL REUS W/TWL LRG LVL3 (GOWN DISPOSABLE) ×2
HANDPIECE INTERPULSE COAX TIP (DISPOSABLE) ×2
HOOD PEEL AWAY FLYTE STAYCOOL (MISCELLANEOUS) ×4 IMPLANT
KIT BASIN OR (CUSTOM PROCEDURE TRAY) ×2 IMPLANT
KIT ROOM TURNOVER OR (KITS) ×2 IMPLANT
MANIFOLD NEPTUNE II (INSTRUMENTS) ×2 IMPLANT
NDL SPNL 18GX3.5 QUINCKE PK (NEEDLE) ×1 IMPLANT
NEEDLE SPNL 18GX3.5 QUINCKE PK (NEEDLE) ×2 IMPLANT
NS IRRIG 1000ML POUR BTL (IV SOLUTION) ×2 IMPLANT
PACK TOTAL JOINT (CUSTOM PROCEDURE TRAY) ×2 IMPLANT
PAD ARMBOARD 7.5X6 YLW CONV (MISCELLANEOUS) ×4 IMPLANT
PEN SKIN MARKING BROAD (MISCELLANEOUS) ×2 IMPLANT
SAW OSC TIP CART 19.5X105X1.3 (SAW) ×2 IMPLANT
SEALER BIPOLAR AQUA 6.0 (INSTRUMENTS) ×2 IMPLANT
SET HNDPC FAN SPRY TIP SCT (DISPOSABLE) ×1 IMPLANT
STAPLER VISISTAT 35W (STAPLE) IMPLANT
SUCTION FRAZIER HANDLE 10FR (MISCELLANEOUS) ×1
SUCTION TUBE FRAZIER 10FR DISP (MISCELLANEOUS) ×1 IMPLANT
SUT ETHILON 2 0 FS 18 (SUTURE) IMPLANT
SUT MNCRL AB 4-0 PS2 18 (SUTURE) IMPLANT
SUT VIC AB 0 CT1 27 (SUTURE) ×4
SUT VIC AB 0 CT1 27XBRD ANBCTR (SUTURE) ×2 IMPLANT
SUT VIC AB 1 CTX 27 (SUTURE) ×6 IMPLANT
SUT VIC AB 2-0 CT1 27 (SUTURE) ×6
SUT VIC AB 2-0 CT1 TAPERPNT 27 (SUTURE) ×3 IMPLANT
SYR 20CC LL (SYRINGE) ×2 IMPLANT
SYR 50ML LL SCALE MARK (SYRINGE) ×2 IMPLANT
TOWEL OR 17X24 6PK STRL BLUE (TOWEL DISPOSABLE) ×2 IMPLANT
TOWEL OR 17X26 10 PK STRL BLUE (TOWEL DISPOSABLE) ×2 IMPLANT
TRAY CATH 16FR W/PLASTIC CATH (SET/KITS/TRAYS/PACK) IMPLANT
UNDERPAD 30X30 (UNDERPADS AND DIAPERS) ×2 IMPLANT
WRAP KNEE MAXI GEL POST OP (GAUZE/BANDAGES/DRESSINGS) ×2 IMPLANT

## 2016-06-27 NOTE — Transfer of Care (Signed)
Immediate Anesthesia Transfer of Care Note  Patient: Cassandra Dodson  Procedure(s) Performed: Procedure(s): LEFT TOTAL KNEE ARTHROPLASTY (Left)  Patient Location: PACU  Anesthesia Type:General  Level of Consciousness: awake, alert  and oriented  Airway & Oxygen Therapy: Patient Spontanous Breathing and Patient connected to face mask oxygen  Post-op Assessment: Report given to RN and Post -op Vital signs reviewed and stable  Post vital signs: Reviewed and stable  Last Vitals:  Vitals:   06/27/16 1200 06/27/16 1201  BP: (!) 163/67   Pulse: 82 82  Resp: (!) 21 (!) 21  Temp:      Last Pain:  Vitals:   06/27/16 1201  TempSrc:   PainSc: 0-No pain      Patients Stated Pain Goal: 2 (44/01/02 7253)  Complications: No apparent anesthesia complications

## 2016-06-27 NOTE — H&P (Signed)
PREOPERATIVE H&P  Chief Complaint: osteoarthritis left knee  HPI: Cassandra Dodson is a 76 y.o. female who presents for surgical treatment of osteoarthritis left knee.  She denies any changes in medical history.  Past Medical History:  Diagnosis Date  . Arthritis   . CKD (chronic kidney disease), stage III   . Depression   . Diabetes mellitus without complication ([place])   . GERD (gastroesophageal reflux disease)   . Headache   . Hypertension   . Hypothyroidism   . Neuromuscular disorder (HCC)    neuropathy in feet  . Osteoarthritis   . PONV (postoperative nausea and vomiting)    severe nausea and vomiting after knee replacement  . Urinary frequency   . Wears glasses    Past Surgical History:  Procedure Laterality Date  . BREAST SURGERY     cyst removed  . CESAREAN SECTION    . FRACTURE SURGERY     right knee  . IRRIGATION AND DEBRIDEMENT SEBACEOUS CYST    . JOINT REPLACEMENT Right    right  . teeth extration    . TONSILLECTOMY    . TOTAL HIP ARTHROPLASTY Right 02/23/2015   Procedure: RIGHT TOTAL HIP ARTHROPLASTY ANTERIOR APPROACH;  Surgeon: Leandrew Koyanagi, MD;  Location: [facility];  Service: Orthopedics;  Laterality: Right;   Social History   Social History  . Marital status: Widowed    Spouse name: N/A  . Number of children: N/A  . Years of education: N/A   Social History Main Topics  . Smoking status: Former Smoker    Packs/day: 1.50    Years: 45.00    Types: Cigarettes    Quit date: 04/22/2002  . Smokeless tobacco: Never Used  . Alcohol use Yes     Comment: rarely  . Drug use: No  . Sexual activity: Not Asked   Other Topics Concern  . None   Social History Narrative  . None   Family History  Problem Relation Age of Onset  . Cancer Mother   . Stroke Father    Allergies  Allergen Reactions  . Anesthetics, Amide Other (See Comments)    Pt is intolerant to general anesthesia. Pt will throw up and has thrown up during procedure.   . Ether    UNSPECIFIED REACTION    Prior to Admission medications   Medication Sig Start Date End Date Taking? Authorizing Provider  aspirin EC 81 MG tablet Take 81 mg by mouth daily.   Yes Historical Provider, MD  Calcium Carb-Cholecalciferol (CALCIUM 600 + D PO) Take 1 tablet by mouth daily.   Yes Historical Provider, MD  fenofibrate 160 MG tablet Take 1 tablet by mouth daily. 05/12/16  Yes Historical Provider, MD  HUMALOG MIX 75/25 KWIKPEN (75-25) 100 UNIT/ML Kwikpen Inject 18 Units into the skin 2 (two) times daily.  06/08/16  Yes Historical Provider, MD  metFORMIN (GLUCOPHAGE) 1000 MG tablet Take 1 tablet by mouth 2 (two) times daily. 01/21/15  Yes Historical Provider, MD  Multiple Vitamins-Minerals (MULTIVITAMIN WITH MINERALS) tablet Take 1 tablet by mouth daily.   Yes Historical Provider, MD  oxyCODONE (OXY IR/ROXICODONE) 5 MG immediate release tablet Take 1-3 tablets (5-15 mg total) by mouth every 4 (four) hours as needed. 02/23/15  Yes Capri Veals Ephriam Jenkins, MD  telmisartan-hydrochlorothiazide (MICARDIS HCT) 80-25 MG tablet Take 1 tablet by mouth daily. 05/19/16  Yes Historical Provider, MD  levothyroxine (SYNTHROID, LEVOTHROID) 50 MCG tablet Take 50 mcg by mouth every evening.  Historical Provider, MD     Positive ROS: All other systems have been reviewed and were otherwise negative with the exception of those mentioned in the HPI and as above.  Physical Exam: General: Alert, no acute distress Cardiovascular: No pedal edema Respiratory: No cyanosis, no use of accessory musculature GI: abdomen soft Skin: No lesions in the area of chief complaint Neurologic: Sensation intact distally Psychiatric: Patient is competent for consent with normal mood and affect Lymphatic: no lymphedema  MUSCULOSKELETAL: exam stable  Assessment: osteoarthritis left knee  Plan: Plan for Procedure(s): LEFT TOTAL KNEE ARTHROPLASTY  The risks benefits and alternatives were discussed with the patient including but not  limited to the risks of nonoperative treatment, versus surgical intervention including infection, bleeding, nerve injury,  blood clots, cardiopulmonary complications, morbidity, mortality, among others, and they were willing to proceed.   Eduard Roux, MD   06/27/2016 10:00 AM

## 2016-06-27 NOTE — Discharge Instructions (Signed)

## 2016-06-27 NOTE — Anesthesia Procedure Notes (Signed)
Procedure Name: Intubation Date/Time: 06/27/2016 12:28 PM Performed by: Rejeana Brock L Pre-anesthesia Checklist: Patient identified, Emergency Drugs available, Suction available and Patient being monitored Patient Re-evaluated:Patient Re-evaluated prior to inductionOxygen Delivery Method: Circle System Utilized Preoxygenation: Pre-oxygenation with 100% oxygen Intubation Type: IV induction, Rapid sequence and Cricoid Pressure applied Laryngoscope Size: Mac and 3 Grade View: Grade I Tube type: Oral Tube size: 7.0 mm Number of attempts: 1 Airway Equipment and Method: Stylet and Oral airway Placement Confirmation: ETT inserted through vocal cords under direct vision,  positive ETCO2 and breath sounds checked- equal and bilateral Secured at: 21 cm Tube secured with: Tape Dental Injury: Teeth and Oropharynx as per pre-operative assessment

## 2016-06-27 NOTE — Anesthesia Procedure Notes (Signed)
Anesthesia Regional Block: Adductor canal block   Pre-Anesthetic Checklist: ,, timeout performed, Correct Patient, Correct Site, Correct Laterality, Correct Procedure, Correct Position, site marked, Risks and benefits discussed, pre-op evaluation,  At surgeon's request and post-op pain management  Laterality: Left  Prep: Maximum Sterile Barrier Precautions used, chloraprep       Needles:  Injection technique: Single-shot  Needle Type: Echogenic Stimulator Needle     Needle Length: 9cm  Needle Gauge: 21     Additional Needles:   Procedures: ultrasound guided,,,,,,,,  Narrative:  Start time: 06/27/2016 11:41 AM End time: 06/27/2016 11:51 AM Injection made incrementally with aspirations every 5 mL. Anesthesiologist: Roderic Palau  Additional Notes: 2% Lidocaine skin wheel.

## 2016-06-27 NOTE — Anesthesia Preprocedure Evaluation (Addendum)
Anesthesia Evaluation  Patient identified by MRN, date of birth, ID band Patient awake    Reviewed: Allergy & Precautions, H&P , NPO status , Patient's Chart, lab work & pertinent test results  History of Anesthesia Complications (+) PONV  Airway Mallampati: III  TM Distance: >3 FB Neck ROM: Full    Dental no notable dental hx. (+) Edentulous Upper, Lower Dentures, Dental Advisory Given   Pulmonary neg pulmonary ROS, former smoker,    Pulmonary exam normal breath sounds clear to auscultation       Cardiovascular hypertension, Pt. on medications  Rhythm:Regular Rate:Normal     Neuro/Psych  Headaches, Depression negative psych ROS   GI/Hepatic Neg liver ROS, GERD  Controlled,  Endo/Other  diabetes, Insulin DependentHypothyroidism Morbid obesity  Renal/GU negative Renal ROS  negative genitourinary   Musculoskeletal  (+) Arthritis , Osteoarthritis,    Abdominal   Peds  Hematology negative hematology ROS (+)   Anesthesia Other Findings   Reproductive/Obstetrics negative OB ROS                            Anesthesia Physical Anesthesia Plan  ASA: III  Anesthesia Plan: General   Post-op Pain Management:  Regional for Post-op pain   Induction: Intravenous, Rapid sequence and Cricoid pressure planned  Airway Management Planned: Oral ETT  Additional Equipment:   Intra-op Plan:   Post-operative Plan: Extubation in OR  Informed Consent: I have reviewed the patients History and Physical, chart, labs and discussed the procedure including the risks, benefits and alternatives for the proposed anesthesia with the patient or authorized representative who has indicated his/her understanding and acceptance.   Dental advisory given  Plan Discussed with: CRNA  Anesthesia Plan Comments:        Anesthesia Quick Evaluation

## 2016-06-27 NOTE — Progress Notes (Signed)
Orthopedic Tech Progress Note Patient Details:  Cassandra Dodson March 02, 1941 [id number]  CPM Left Knee CPM Left Knee: On Left Knee Flexion (Degrees): 90 Left Knee Extension (Degrees): 0   [name] 06/27/2016, 3:41 PM Pt unable to use trapeze bar patient helper; RN notified

## 2016-06-27 NOTE — Op Note (Signed)
Total Knee Arthroplasty Procedure Note [name] [id number] 06/27/2016   Preoperative diagnosis: Left knee osteoarthritis  Postoperative diagnosis:same  Operative procedure: Left total knee arthroplasty. CPT [phone]  Surgeon: N. Eduard Roux, MD  Assistants: Ky Barban, RNFA  Anesthesia: general, regional  Tourniquet time: less than 70 mins  Implants used: [employer] Femur: PS 5 Tibia: 5 Patella: 29 mm, 7.5 thick Polyethylene: 13 mm  Indication: Cassandra Dodson is a 76 y.o. year old female with a history of knee pain. Having failed conservative management, the patient elected to proceed with a total knee arthroplasty.  We have reviewed the risk and benefits of the surgery and they elected to proceed after voicing understanding.  Procedure:  After informed consent was obtained and understanding of the risk were voiced including but not limited to bleeding, infection, damage to surrounding structures including nerves and vessels, blood clots, leg length inequality and the failure to achieve desired results, the operative extremity was marked with verbal confirmation of the patient in the holding area.   The patient was then brought to the operating room and transported to the operating room table in the supine position.  A tourniquet was applied to the operative extremity around the upper thigh. The operative limb was then prepped and draped in the usual sterile fashion and preoperative antibiotics were administered.  A time out was performed prior to the start of surgery confirming the correct extremity, preoperative antibiotic administration, as well as team members, implants and instruments available for the case. Correct surgical site was also confirmed with preoperative radiographs. The limb was then elevated for exsanguination and the tourniquet was inflated. A midline incision was made and a standard medial parapatellar approach was performed.  The patella was  prepared and sized to a 29 mm.  A cover was placed on the patella for protection from retractors.  We then turned our attention to the femur. Posterior cruciate ligament was sacrificed. Start site was drilled in the femur and the intramedullary distal femoral cutting guide was placed, set at 5 degrees valgus, taking 11 mm of distal resection. The distal cut was made. Osteophytes were then removed. Next, the proximal tibial cutting guide was placed with appropriate slope, varus/valgus alignment and depth of resection. The proximal tibial cut was made. Gap blocks were then used to assess the extension gap and alignment, and appropriate soft tissue releases were performed. Attention was turned back to the femur, which was sized using the sizing guide to a size 5. Appropriate rotation of the femoral component was determined using epicondylar axis, Whiteside's line, and assessing the flexion gap under ligament tension. The appropriate size 4-in-1 cutting block was placed and cuts were made. Posterior femoral osteophytes and uncapped bone were then removed with the curved osteotome. The tibia was sized for a size 5 component. The femoral box-cutting guide was placed and prepared for a PS femoral component. Trial components were placed, and stability was checked in full extension, mid-flexion, and deep flexion. Proper tibial rotation was determined and marked.  The patella tracked well without a lateral release. Trial components were then removed and tibial preparation performed. A posterior capsular injection comprising of 20 cc of 1.3% exparel and 40 cc of normal saline was performed for postoperative pain control. The bony surfaces were irrigated with a pulse lavage and then dried. Bone cement was vacuum mixed on the back table, and the final components sized above were cemented into place. After cement had finished curing, excess  cement was removed. The stability of the construct was re-evaluated throughout a range  of motion and found to be acceptable. The trial liner was removed, the knee was copiously irrigated, and the knee was re-evaluated for any excess bone debris. The real polyethylene liner, 56mm thick, was inserted and checked to ensure the locking mechanism had engaged appropriately. The tourniquet was deflated and hemostasis was achieved. The wound was irrigated with dilute betadine in normal saline, and then again with normal saline. A drain was not placed. Capsular closure was performed with a #1 vicryl, subcutaneous fat closed with a 0 vicryl suture, then subcutaneous tissue closed with interrupted 2.0 vicryl suture. The skin was then closed with a 3.0 monocryl. A sterile dressing was applied.   The patient was awakened in the operating room and taken to recovery in stable condition. All sponge, needle, and instrument counts were correct at the end of the case.  Position: supine  Complications: none.  Time Out: performed   Drains/Packing: none  Estimated blood loss: 75 cc  Returned to Recovery Room: in good condition.   Antibiotics: yes   Mechanical VTE (DVT) Prophylaxis: sequential compression devices, TED thigh-high  Chemical VTE (DVT) Prophylaxis: aspirin  Fluid Replacement  Crystalloid: see anesthesia record Blood: none  FFP: none   Specimens Removed: 1 to pathology   Sponge and Instrument Count Correct? yes   PACU: portable radiograph - knee AP and Lateral   Admission: inpatient status  Plan/RTC: Return in 2 weeks for wound check.   Weight Bearing/Load Lower Extremity: full   N. Eduard Roux, MD [facility] [phone] 2:30 PM

## 2016-06-28 ENCOUNTER — Encounter (HOSPITAL_COMMUNITY): Payer: Self-pay | Admitting: Orthopaedic Surgery

## 2016-06-28 LAB — BASIC METABOLIC PANEL
Anion gap: 8 (ref 5–15)
BUN: 16 mg/dL (ref 6–20)
CALCIUM: 8.3 mg/dL — AB (ref 8.9–10.3)
CO2: 25 mmol/L (ref 22–32)
CREATININE: 1.47 mg/dL — AB (ref 0.44–1.00)
Chloride: 99 mmol/L — ABNORMAL LOW (ref 101–111)
GFR calc Af Amer: 39 mL/min — ABNORMAL LOW (ref >60)
GFR, EST NON AFRICAN AMERICAN: 33 mL/min — AB (ref >60)
GLUCOSE: 202 mg/dL — AB (ref 65–99)
Potassium: 3.7 mmol/L (ref 3.5–5.1)
SODIUM: 132 mmol/L — AB (ref 135–145)

## 2016-06-28 LAB — CBC
HCT: 31 % — ABNORMAL LOW (ref 36.0–46.0)
Hemoglobin: 9.7 g/dL — ABNORMAL LOW (ref 12.0–15.0)
MCH: 27 pg (ref 26.0–34.0)
MCHC: 31.3 g/dL (ref 30.0–36.0)
MCV: 86.4 fL (ref 78.0–100.0)
PLATELETS: 191 10*3/uL (ref 150–400)
RBC: 3.59 MIL/uL — ABNORMAL LOW (ref 3.87–5.11)
RDW: 16.1 % — AB (ref 11.5–15.5)
WBC: 7.5 10*3/uL (ref 4.0–10.5)

## 2016-06-28 LAB — GLUCOSE, CAPILLARY
Glucose-Capillary: 188 mg/dL — ABNORMAL HIGH (ref 65–99)
Glucose-Capillary: 162 mg/dL — ABNORMAL HIGH (ref 65–99)
Glucose-Capillary: 296 mg/dL — ABNORMAL HIGH (ref 65–99)
Glucose-Capillary: 174 mg/dL — ABNORMAL HIGH (ref 65–99)

## 2016-06-28 NOTE — Clinical Social Work Placement (Signed)
   CLINICAL SOCIAL WORK PLACEMENT  NOTE  Date:  06/28/2016  Patient Details  Name: Cassandra Dodson MRN: [id number] Date of Birth: [birth year]  Clinical Social Work is seeking post-discharge placement for this patient at the [place] level of care (*CSW will initial, date and re-position this form in  chart as items are completed):      Patient/family provided with [facility] Work Department's list of facilities offering this level of care within the geographic area requested by the patient (or if unable, by the patient's family).  Yes   Patient/family informed of their freedom to choose among providers that offer the needed level of care, that participate in Medicare, Medicaid or managed care program needed by the patient, have an available bed and are willing to accept the patient.      Patient/family informed of [facility]'s ownership interest in [facility] and [facility], as well as of the fact that they are under no obligation to receive care at these facilities.  PASRR submitted to EDS on       PASRR number received on 06/28/16     Existing PASRR number confirmed on       FL2 transmitted to all facilities in geographic area requested by pt/family on 06/28/16     FL2 transmitted to all facilities within larger geographic area on       Patient informed that his/her managed care company has contracts with or will negotiate with certain facilities, including the following:        Yes   Patient/family informed of bed offers received.  Patient chooses bed at [facility]     Physician recommends and patient chooses bed at      Patient to be transferred to [facility] on 06/29/16.  Patient to be transferred to facility by [facility]     Patient family notified on 06/29/16 of transfer.  Name of family member notified:        PHYSICIAN Please prepare priority discharge summary, including medications, Please prepare prescriptions, Please sign  FL2     Additional Comment:    _______________________________________________ Alla German, LCSW 06/28/2016, 2:21 PM

## 2016-06-28 NOTE — NC FL2 (Signed)
[place] LEVEL OF CARE SCREENING TOOL     IDENTIFICATION  Patient Name: Cassandra Dodson Birthdate: 10-21-1940 Sex: female Admission Date (Current Location): 06/27/2016  [facility] and [place] Number:  Herbalist and Address:  The . [facility], [place] [address] [place],  [address]      Provider Number: [id number]  Attending Physician Name and Address:  Leandrew Koyanagi, MD  Relative Name and Phone Number:       Current Level of Care: Hospital Recommended Level of Care: [place] Prior Approval Number:    Date Approved/Denied:   PASRR Number: [id number]  Discharge Plan: SNF    Current Diagnoses: Patient Active Problem List   Diagnosis Date Noted  . Total knee replacement status 06/27/2016  . Primary osteoarthritis of left knee 04/30/2016  . 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 Hip arthritis 02/23/2015    Orientation RESPIRATION BLADDER Height & Weight     Self, Time, Situation, Place  Normal Continent Weight: 220 lb (99.8 kg) Height:  5\' 5"  (165.1 cm)  BEHAVIORAL SYMPTOMS/MOOD NEUROLOGICAL BOWEL NUTRITION STATUS      Continent  (Please see discharge summary)  AMBULATORY STATUS COMMUNICATION OF NEEDS Skin   Limited Assist Verbally Surgical wounds (Closed incision left knee, adhesive bandage. Closed incision right hip.)                       Personal Care Assistance Level of Assistance  Bathing, Feeding, Dressing Bathing Assistance: Limited assistance Feeding assistance: Independent Dressing Assistance: Limited assistance     Functional Limitations Info  Sight, Hearing, Speech Sight Info: Adequate Hearing Info: Adequate Speech Info: Adequate    SPECIAL CARE FACTORS FREQUENCY  PT (By licensed PT), OT (By licensed OT)     PT Frequency: 7x week OT Frequency: 7x week            Contractures Contractures Info: Not present    Additional Factors Info  Code Status, Allergies Code Status  Info: Full Code Allergies Info: Anesthetics, Amide, Ether           Current Medications (06/28/2016):  This is the current hospital active medication list Current Facility-Administered Medications  Medication Dose Route Frequency Provider Last Rate Last Dose  . 0.9 %  sodium chloride infusion   Intravenous Continuous Leandrew Koyanagi, MD 125 mL/hr at 06/27/16 1724    . acetaminophen (TYLENOL) tablet 650 mg  650 mg Oral Q6H PRN Naiping Ephriam Jenkins, MD       Or  . acetaminophen (TYLENOL) suppository 650 mg  650 mg Rectal Q6H PRN Naiping Ephriam Jenkins, MD      . alum & mag hydroxide-simeth (MAALOX/MYLANTA) 200-200-20 MG/5ML suspension 30 mL  30 mL Oral Q4H PRN Naiping Ephriam Jenkins, MD      . aspirin EC tablet 325 mg  325 mg Oral BID Leandrew Koyanagi, MD   325 mg at 06/28/16 0813  . diphenhydrAMINE (BENADRYL) 12.5 MG/5ML elixir 25 mg  25 mg Oral Q4H PRN Leandrew Koyanagi, MD      . fenofibrate tablet 160 mg  160 mg Oral Daily Naiping Ephriam Jenkins, MD   160 mg at 06/28/16 0814  . irbesartan (AVAPRO) tablet 300 mg  300 mg Oral Daily Naiping Ephriam Jenkins, MD   300 mg at 06/28/16 4827   And  . hydrochlorothiazide (HYDRODIURIL) tablet 25 mg  25 mg Oral Daily Naiping Ephriam Jenkins, MD   25 mg  at 06/28/16 0814  . insulin aspart (novoLOG) injection 0-20 Units  0-20 Units Subcutaneous TID WC Naiping Ephriam Jenkins, MD   4 Units at 06/28/16 1212  . insulin aspart (novoLOG) injection 0-5 Units  0-5 Units Subcutaneous QHS Leandrew Koyanagi, MD   3 Units at 06/27/16 2048  . insulin aspart protamine- aspart (NOVOLOG MIX 70/30) injection 18 Units  18 Units Subcutaneous BID WC Naiping Ephriam Jenkins, MD   18 Units at 06/28/16 0815  . lactated ringers infusion   Intravenous Continuous Roderic Palau, MD 10 mL/hr at 06/27/16 1029    . levothyroxine (SYNTHROID, LEVOTHROID) tablet 50 mcg  50 mcg Oral QPM Naiping Ephriam Jenkins, MD   50 mcg at 06/27/16 1725  . magnesium citrate solution 1 Bottle  1 Bottle Oral Once PRN Naiping Ephriam Jenkins, MD      . menthol-cetylpyridinium (CEPACOL) lozenge 3 mg  1 lozenge Oral  PRN Naiping Ephriam Jenkins, MD       Or  . phenol (CHLORASEPTIC) mouth spray 1 spray  1 spray Mouth/Throat PRN Naiping Ephriam Jenkins, MD      . methocarbamol (ROBAXIN) tablet 500 mg  500 mg Oral Q6H PRN Naiping Ephriam Jenkins, MD       Or  . methocarbamol (ROBAXIN) 500 mg in dextrose 5 % 50 mL IVPB  500 mg Intravenous Q6H PRN Naiping Ephriam Jenkins, MD      . metoCLOPramide (REGLAN) tablet 5-10 mg  5-10 mg Oral Q8H PRN Naiping Ephriam Jenkins, MD       Or  . metoCLOPramide (REGLAN) injection 5-10 mg  5-10 mg Intravenous Q8H PRN Naiping Ephriam Jenkins, MD      . morphine 2 MG/ML injection 1 mg  1 mg Intravenous Q2H PRN Leandrew Koyanagi, MD   1 mg at 06/27/16 1724  . multivitamin with minerals tablet 1 tablet  1 tablet Oral Daily Naiping Ephriam Jenkins, MD   1 tablet at 06/28/16 [phone]  . ondansetron (ZOFRAN) tablet 4 mg  4 mg Oral Q6H PRN Naiping Ephriam Jenkins, MD       Or  . ondansetron [facility]) injection 4 mg  4 mg Intravenous Q6H PRN Naiping Ephriam Jenkins, MD      . oxyCODONE (Oxy IR/ROXICODONE) immediate release tablet 5-15 mg  5-15 mg Oral Q3H PRN Leandrew Koyanagi, MD   5 mg at 06/27/16 1553  . oxyCODONE (OXYCONTIN) 12 hr tablet 10 mg  10 mg Oral Q12H Naiping Ephriam Jenkins, MD   10 mg at 06/28/16 0813  . polyethylene glycol (MIRALAX / GLYCOLAX) packet 17 g  17 g Oral Daily PRN Naiping Ephriam Jenkins, MD      . sorbitol 70 % solution 30 mL  30 mL Oral Daily PRN Naiping Ephriam Jenkins, MD         Discharge Medications: Please see discharge summary for a list of discharge medications.  Relevant Imaging Results:  Relevant Lab Results:   Additional Information SSN: [id number]  Alla German, LCSW

## 2016-06-28 NOTE — Progress Notes (Signed)
   Subjective:  Patient reports pain as mild.    Objective:   VITALS:   Vitals:   06/27/16 1638 06/27/16 1900 06/28/16 0019 06/28/16 0556  BP: 101/76 (!) 141/97 (!) 148/56 (!) 140/58  Pulse: 96 (!) 101 86 81  Resp: 16  18 18   Temp: 97.7 F (36.5 C) 99.1 F (37.3 C) 98.3 F (36.8 C) 98.9 F (37.2 C)  TempSrc: Oral Oral Oral Oral  SpO2: 94% 92% 100% 97%  Weight:      Height:        Neurologically intact Neurovascular intact Sensation intact distally Intact pulses distally Dorsiflexion/Plantar flexion intact Incision: dressing C/D/I and no drainage No cellulitis present Compartment soft   Lab Results  Component Value Date   WBC 7.5 06/28/2016   HGB 9.7 (L) 06/28/2016   HCT 31.0 (L) 06/28/2016   MCV 86.4 06/28/2016   PLT 191 06/28/2016     Assessment/Plan:  1 Day Post-Op   - Expected postop acute blood loss anemia - will monitor for symptoms - Up with PT/OT - DVT ppx - SCDs, ambulation, aspirin - WBAT operative extremity - Pain control - Discharge planning - anticipate dc to [facility] saturday  Eduard Roux 06/28/2016, 12:30 PM [phone]

## 2016-06-28 NOTE — Evaluation (Signed)
Occupational Therapy Evaluation Patient Details Name: Cassandra Dodson MRN: [id number] DOB: 1940/11/04 Today's Date: 06/28/2016    History of Present Illness Pt is a 76yo female admitted on 06/27/16 for Left TKA. PMH significant for OA, CKD, Depression, DM2, GERD, HTN, Neuropathy, R THA 2016.    Clinical Impression   Pt admitted with the above diagnoses and presents with below problem list. Pt will benefit from continued OT to address the below listed deficits and maximize independence with basic ADLs prior to d/c to next venue. PTA pt was independent with ADLs. Pt is currently min A for LB ADLs and functional transfers and mobility.       Follow Up Recommendations  SNF    Equipment Recommendations  Other (comment) (defer to next venue)    Recommendations for Other Services       Precautions / Restrictions Precautions Precautions: Knee Precaution Booklet Issued: Yes (comment) Precaution Comments: reviewed Restrictions Weight Bearing Restrictions: Yes LLE Weight Bearing: Weight bearing as tolerated      Mobility Bed Mobility Overal bed mobility: Needs Assistance Bed Mobility: Sit to Supine     Supine to sit: Min guard     General bed mobility comments: Min A to bring LLE EOB. Pt is able to move trunk to EOB without assistance  Transfers Overall transfer level: Needs assistance Equipment used: Rolling walker (2 wheeled) Transfers: Sit to/from Stand Sit to Stand: Min guard;Min assist         General transfer comment: min guard from recliner, min A to/from BSC to steady/control descent    Balance Overall balance assessment: Needs assistance Sitting-balance support: No upper extremity supported;Feet supported Sitting balance-Leahy Scale: Good     Standing balance support: Bilateral upper extremity supported;During functional activity Standing balance-Leahy Scale: Poor Standing balance comment: Relies on Rw for stability with standing and gait                             ADL Overall ADL's : Needs assistance/impaired Eating/Feeding: Set up;Sitting   Grooming: Set up;Min guard;Sitting;Standing   Upper Body Bathing: Set up;Sitting   Lower Body Bathing: Minimal assistance;Min guard;Sit to/from stand   Upper Body Dressing : Set up;Sitting   Lower Body Dressing: Min guard;Minimal assistance;Sit to/from stand   Toilet Transfer: Minimal assistance;Ambulation;BSC;RW   Toileting- Clothing Manipulation and Hygiene: Minimal assistance;Sit to/from stand   Tub/ Shower Transfer: Walk-in shower;Minimal assistance;Ambulation   Functional mobility during ADLs: Minimal assistance;Rolling walker;Min guard General ADL Comments: Pt completed walking to to [facility] in room, toilet transfer, and pericare as detailed above.  Hesitant to WB on LLE.      Vision         Perception     Praxis      Pertinent Vitals/Pain Pain Assessment: Faces Pain Score: 5  Faces Pain Scale: Hurts little more Pain Location: Left knee Pain Descriptors / Indicators: Aching;Guarding;Grimacing;Sore Pain Intervention(s): Limited activity within patient's tolerance;Monitored during session;Premedicated before session;Repositioned     Hand Dominance Right   Extremity/Trunk Assessment Upper Extremity Assessment Upper Extremity Assessment: Overall WFL for tasks assessed   Lower Extremity Assessment Lower Extremity Assessment: Defer to PT evaluation LLE Deficits / Details: Pt with normal post op pain and weakness. At least 3/5 ankle and 2/5 knee and hip per gross functional assessment LLE: Unable to fully assess due to pain       Communication Communication Communication: No difficulties   Cognition Arousal/Alertness: Awake/alert Behavior During Therapy: [facility]  for tasks assessed/performed Overall Cognitive Status: Within Functional Limits for tasks assessed                     General Comments       Exercises Exercises: Total Joint     Shoulder  Instructions      Home Living Family/patient expects to be discharged to:: Skilled nursing facility                                 Additional Comments: Has a son who lives with her. Has already planned to go to [facility] for rehab      Prior Functioning/Environment Level of Independence: Independent with assistive device(s)        Comments: Was using cane prior to surgery. Pt reports that she is not a fan of walkers.         OT Problem List: Impaired balance (sitting and/or standing);Decreased knowledge of use of DME or AE;Decreased knowledge of precautions;Pain      OT Treatment/Interventions: Self-care/ADL training;DME and/or AE instruction;Therapeutic activities;Patient/family education;Balance training    OT Goals(Current goals can be found in the care plan section) Acute Rehab OT Goals Patient Stated Goal: to go to rehab and then back home OT Goal Formulation: With patient Time For Goal Achievement: 07/05/16 Potential to Achieve Goals: Good ADL Goals Pt Will Perform Lower Body Bathing: with supervision;sit to/from stand Pt Will Perform Lower Body Dressing: with supervision;sit to/from stand Pt Will Transfer to Toilet: with supervision;ambulating Pt Will Perform Toileting - Clothing Manipulation and hygiene: with supervision;sit to/from stand  OT Frequency: Min 2X/week   Barriers to D/C:            Co-evaluation              End of Session Equipment Utilized During Treatment: Rolling walker CPM Left Knee CPM Left Knee: On  Activity Tolerance: Patient tolerated treatment well;Patient limited by pain Patient left: in bed;in CPM;with call bell/phone within reach;with nursing/sitter in room  OT Visit Diagnosis: Pain;Unsteadiness on feet (R26.81) Pain - Right/Left: Left Pain - part of body: Knee                ADL either performed or assessed with clinical judgement  Time: 9201-0071 OT Time Calculation (min): 32 min Charges:  OT General  Charges $OT Visit: 1 Procedure OT Evaluation $OT Eval Low Complexity: 1 Procedure OT Treatments $Self Care/Home Management : 8-22 mins G-Codes:       Hortencia Pilar 06/28/2016, 12:23 PM

## 2016-06-28 NOTE — Evaluation (Signed)
Physical Therapy Evaluation Patient Details Name: Cassandra ZAHRADNIK MRN: [id number] DOB: [birth year] Today's Date: 06/28/2016   History of Present Illness  Pt is a 75yo female admitted on 06/27/16 for Left TKA. PMH significant for OA, CKD, Depression, DM2, GERD, HTN, Neuropathy, R THA 2016.   Clinical Impression  Pt is POD 1 following the above procedure. Prior to admission, pt was independent with the use of a [place] for mobility. Pt lives with her son in a second floor apartment with an elevator for transportation to second level. Pt is electing to go to [facility] for rehab before returning home. Pt is not motivated to mobilize this AM and requires encouragement to get out of bed and perform short distance gait to recliner. Pt will benefit from continued acute PT services in order to maximize her outcomes prior to discharge to venue suggested below.     Follow Up Recommendations SNF;Supervision for mobility/OOB    Equipment Recommendations  None recommended by PT    Recommendations for Other Services       Precautions / Restrictions Precautions Precautions: Knee Precaution Booklet Issued: Yes (comment) Precaution Comments: Handout given and reviewed no pillow under knee Restrictions Weight Bearing Restrictions: Yes LLE Weight Bearing: Weight bearing as tolerated      Mobility  Bed Mobility Overal bed mobility: Needs Assistance Bed Mobility: Supine to Sit     Supine to sit: Min assist     General bed mobility comments: Min A to bring LLE EOB. Pt is able to move trunk to EOB without assistance  Transfers Overall transfer level: Needs assistance Equipment used: Rolling walker (2 wheeled) Transfers: Sit to/from Stand Sit to Stand: Min guard         General transfer comment: Min guard for safety from EOB. Pt uses RW to stand and cues were given and reinforced proper hand placement with standing  Ambulation/Gait Ambulation/Gait assistance: Min guard Ambulation Distance (Feet):  5 Feet Assistive device: Rolling walker (2 wheeled) Gait Pattern/deviations: Step-to pattern;Decreased weight shift to left;Antalgic Gait velocity: decreased Gait velocity interpretation: Below normal speed for age/gender General Gait Details: Mild antalgic gait with cues for sequencing. pt moves quickly to recliner.   Stairs            Wheelchair Mobility    Modified Rankin (Stroke Patients Only)       Balance Overall balance assessment: Needs assistance Sitting-balance support: No upper extremity supported;Feet supported Sitting balance-Leahy Scale: Good     Standing balance support: Bilateral upper extremity supported;During functional activity Standing balance-Leahy Scale: Poor Standing balance comment: Relies on Rw for stability with standing and gait                             Pertinent Vitals/Pain Pain Assessment: 0-10 Pain Score: 5  Pain Location: Left knee Pain Descriptors / Indicators: Aching;Guarding;Grimacing;Sore Pain Intervention(s): Limited activity within patient's tolerance;Monitored during session;Premedicated before session;Repositioned;Ice applied    Home Living Family/patient expects to be discharged to:: Skilled nursing facility                 Additional Comments: Has a son who lives with her. Has already planned to go to [facility] for rehab    Prior Function Level of Independence: Independent with assistive device(s)         Comments: Was using cane prior to surgery. Pt reports that she is not a fan of walkers.      Hand  Dominance   Dominant Hand: Right    Extremity/Trunk Assessment   Upper Extremity Assessment Upper Extremity Assessment: Defer to OT evaluation    Lower Extremity Assessment Lower Extremity Assessment: LLE deficits/detail LLE Deficits / Details: Pt with normal post op pain and weakness. At least 3/5 ankle and 2/5 knee and hip per gross functional assessment LLE: Unable to fully assess due  to pain       Communication   Communication: No difficulties  Cognition Arousal/Alertness: Awake/alert Behavior During Therapy: WFL for tasks assessed/performed Overall Cognitive Status: Within Functional Limits for tasks assessed                      General Comments      Exercises Total Joint Exercises Ankle Circles/Pumps: AROM;Both;20 reps;Supine Quad Sets: AROM;Left;10 reps;Supine Heel Slides: AAROM;Left;10 reps;Supine Goniometric ROM: 3-75   Assessment/Plan    PT Assessment Patient needs continued PT services  PT Problem List Decreased strength;Decreased range of motion;Decreased activity tolerance;Decreased balance;Decreased mobility;Decreased knowledge of use of DME;Pain       PT Treatment Interventions DME instruction;Gait training;Functional mobility training;Therapeutic activities;Therapeutic exercise;Balance training;Patient/family education    PT Goals (Current goals can be found in the Care Plan section)  Acute Rehab PT Goals Patient Stated Goal: to go to rehab and then back home PT Goal Formulation: With patient Time For Goal Achievement: 07/05/16 Potential to Achieve Goals: Good    Frequency 7X/week   Barriers to discharge        Co-evaluation               End of Session Equipment Utilized During Treatment: Gait belt Activity Tolerance: Patient tolerated treatment well Patient left: in chair;with call bell/phone within reach;with nursing/sitter in room Nurse Communication: Mobility status PT Visit Diagnosis: Difficulty in walking, not elsewhere classified (R26.2);Pain;Other (comment) (Decreased range of motion left knee) Pain - Right/Left: Left Pain - part of body: Knee         Time: 7412-8786 PT Time Calculation (min) (ACUTE ONLY): 29 min   Charges:   PT Evaluation $PT Eval Moderate Complexity: 1 Procedure PT Treatments $Therapeutic Exercise: 8-22 mins   PT G Codes:         Scheryl Marten PT, DPT   [phone]  06/28/2016, 8:55 AM

## 2016-06-28 NOTE — Plan of Care (Signed)
Problem: Physical Regulation: Goal: Will remain free from infection Outcome: Progressing No S/S of infection noted  Problem: Tissue Perfusion: Goal: Risk factors for ineffective tissue perfusion will decrease Outcome: Progressing SCD' are on , denies S/S of DVT  Problem: Activity: Goal: Risk for activity intolerance will decrease Outcome: Progressing On bed rest  Problem: Bowel/Gastric: Goal: Will not experience complications related to bowel motility Outcome: Progressing Denies gastric and bowel issues, last BM 06/25/2016

## 2016-06-28 NOTE — Clinical Social Work Note (Signed)
Clinical Social Work Assessment  Patient Details  Name: Cassandra Dodson MRN: [id number] Date of Birth: [birth year]  Date of referral:  06/28/16               Reason for consult:  Facility Placement                Permission sought to share information with:    Permission granted to share information::     Name::        Agency::     Relationship::     Contact Information:     Housing/Transportation Living arrangements for the past 2 months:  [place], [place] of Information:  Patient Patient Interpreter Needed:  None Criminal Activity/Legal Involvement Pertinent to Current Situation/Hospitalization:  No - Comment as needed Significant Relationships:  Adult Children, Friend Lives with:  Self, Adult Children Do you feel safe going back to the place where you live?  Yes Need for family participation in patient care:  Yes (Comment)  Care giving concerns:  Pt's friend Fraser Din was present at bedside during initial assessment. Fraser Din is pt's son's mother in law.    Social Worker assessment / plan:  CSW spoke with pt at bedside to complete initial assessment. Pt has been to [facility] in the past. Pt lives home alone, generally. Pt reports her son is temporarily living with her until she is independent again. Pt wants to go back to [place]. CSW will follow up with facility. MD is predicting a Saturday d/c. Pt will go by [facility].  Employment status:  Retired Nurse, adult PT Recommendations:  [place] / Referral to community resources:  [place]  Patient/Family's Response to care:  Pt verbalized understanding of CSW role and expressed appreciation for support. Pt denies any concern regarding pt care at this time.   Patient/Family's Understanding of and Emotional Response to Diagnosis, Current Treatment, and Prognosis:  Pt understanding and realistic regarding physical limitations. Pt understands the need  for SNF placement at d/c. Pt agreeable to SNF placement at d/c, at this time. Pt's responses emotionally appropriate during conversation with CSW. Pt denies any concern regarding treatment plan at this time. CSW will continue to provide support and facilitate d/c needs.   Emotional Assessment Appearance:  Appears stated age Attitude/Demeanor/Rapport:   (Patient was appropriate. Pt was humorous) Affect (typically observed):  Accepting, Apprehensive, Calm, Pleasant, Happy Orientation:  Oriented to Self, Oriented to Place, Oriented to  Time, Oriented to Situation Alcohol / Substance use:  Not Applicable Psych involvement (Current and /or in the community):  No (Comment)  Discharge Needs  Concerns to be addressed:  No discharge needs identified Readmission within the last 30 days:  No Current discharge risk:  Dependent with Mobility Barriers to Discharge:  Continued Medical Work up   [employer], LCSW 06/28/2016, 2:18 PM

## 2016-06-29 LAB — CBC
HEMATOCRIT: 29.7 % — AB (ref 36.0–46.0)
Hemoglobin: 9.5 g/dL — ABNORMAL LOW (ref 12.0–15.0)
MCH: 27.5 pg (ref 26.0–34.0)
MCHC: 32 g/dL (ref 30.0–36.0)
MCV: 86.1 fL (ref 78.0–100.0)
Platelets: 173 10*3/uL (ref 150–400)
RBC: 3.45 MIL/uL — ABNORMAL LOW (ref 3.87–5.11)
RDW: 16 % — ABNORMAL HIGH (ref 11.5–15.5)
WBC: 8.5 10*3/uL (ref 4.0–10.5)

## 2016-06-29 LAB — GLUCOSE, CAPILLARY
GLUCOSE-CAPILLARY: 166 mg/dL — AB (ref 65–99)
Glucose-Capillary: 120 mg/dL — ABNORMAL HIGH (ref 65–99)
Glucose-Capillary: 176 mg/dL — ABNORMAL HIGH (ref 65–99)

## 2016-06-29 NOTE — Clinical Social Work Note (Signed)
Clinical Social Worker facilitated patient discharge including contacting patient family and facility to confirm patient discharge plans.  Clinical information faxed to facility and family agreeable with plan.  CSW arranged ambulance transport via [place] (2:00) to [place] .  RN to call [phone] for report prior to discharge. Patient will go to room 106.  Clinical Social Worker will sign off for now as social work intervention is no longer needed. Please consult [place] again if new need arises.  [address], [place]

## 2016-06-29 NOTE — Progress Notes (Signed)
Pt ready for d/c to SNF today per MD. Report was called to Fraser at [facility], all questions answered. Belongings packed and sent with pt. Son is aware of plan.   [place], Jerry Caras

## 2016-06-29 NOTE — Discharge Summary (Signed)
Discharge Diagnoses:  Active Problems:   Primary osteoarthritis of left knee   Total knee replacement status   Surgeries: Procedure(s): LEFT TOTAL KNEE ARTHROPLASTY on 06/27/2016    Consultants:   Discharged Condition: Improved  Hospital Course: Cassandra Dodson is an 76 y.o. female who was admitted 06/27/2016 with a chief complaint of osteoarthritis left knee, with a final diagnosis of osteoarthritis left knee.  Patient was brought to the operating room on 06/27/2016 and underwent Procedure(s): LEFT TOTAL KNEE ARTHROPLASTY.    Patient was given perioperative antibiotics: Anti-infectives    Start     Dose/Rate Route Frequency Ordered Stop   06/27/16 1800  ceFAZolin (ANCEF) IVPB 2g/100 mL premix     2 g 200 mL/hr over 30 Minutes Intravenous Every 6 hours 06/27/16 1659 06/28/16 0017   06/27/16 1200  ceFAZolin (ANCEF) IVPB 2g/100 mL premix     2 g 200 mL/hr over 30 Minutes Intravenous To ShortStay Surgical 06/26/16 0722 06/27/16 1230   06/27/16 0000  sulfamethoxazole-trimethoprim (BACTRIM DS,SEPTRA DS) 800-160 MG tablet     1 tablet Oral 2 times daily 06/27/16 1435      .  Patient was given sequential compression devices, early ambulation, and aspirin for DVT prophylaxis.  Recent vital signs: Patient Vitals for the past 24 hrs:  BP Temp Temp src Pulse Resp SpO2  06/29/16 0519 95/72 98.5 F (36.9 C) Oral 85 16 98 %  06/28/16 2040 (!) 145/61 98.3 F (36.8 C) Oral 73 16 98 %  06/28/16 1420 133/67 99.5 F (37.5 C) - 94 16 93 %  .  Recent laboratory studies: Dg Knee Left Port  Result Date: 06/27/2016 CLINICAL DATA:  Status post total knee joint prosthesis placement EXAM: PORTABLE LEFT KNEE - 1-2 VIEW COMPARISON:  None in PACs FINDINGS: The patient has undergone right total knee joint prosthesis placement. Radiographic positioning of the prosthetic components is good. The interface with the native bone appears normal. There is soft tissue gas in the joint and along the distal aspect of the  femoral shaft. IMPRESSION: There is no immediate postprocedure complication following left total knee joint prosthesis placement. Electronically Signed   By: David  Martinique M.D.   On: 06/27/2016 15:30    Discharge Medications:   Allergies as of 06/29/2016      Reactions   Anesthetics, Am[place] Other (See Comments)   Pt is intolerant to general anesthesia. Pt will throw up and has thrown up during procedure.    Ether    UNSPECIFIED REACTION       Medication List    TAKE these medications   aspirin EC 81 MG tablet Take 81 mg by mouth daily.   CALCIUM 600 + D PO Take 1 tablet by mouth daily.   fenofibrate 160 MG tablet Take 1 tablet by mouth daily.   HUMALOG MIX 75/25 KWIKPEN (75-25) 100 UNIT/ML Kwikpen Generic drug:  Insulin Lispro Prot & Lispro Inject 18 Units into the skin 2 (two) times daily.   levothyroxine 50 MCG tablet Commonly known as:  SYNTHROID, LEVOTHROID Take 50 mcg by mouth every evening.   metFORMIN 1000 MG tablet Commonly known as:  GLUCOPHAGE Take 1 tablet by mouth 2 (two) times daily.   methocarbamol 750 MG tablet Commonly known as:  ROBAXIN Take 1 tablet (750 mg total) by mouth 2 (two) times daily as needed for muscle spasms.   multivitamin with minerals tablet Take 1 tablet by mouth daily.   ondansetron 4 MG tablet Commonly known as:  ZOFRAN  Take 1-2 tablets (4-8 mg total) by mouth every 8 (eight) hours as needed for nausea or vomiting.   oxyCODONE 5 MG immediate release tablet Commonly known as:  Oxy IR/ROXICODONE Take 1-3 tablets (5-15 mg total) by mouth every 4 (four) hours as needed.   oxyCODONE-acetaminophen 5-325 MG tablet Commonly known as:  PERCOCET Take 1-2 tablets by mouth every 4 (four) hours as needed for severe pain.   promethazine 25 MG tablet Commonly known as:  PHENERGAN Take 1 tablet (25 mg total) by mouth every 6 (six) hours as needed for nausea.   senna-docusate 8.6-50 MG tablet Commonly known as:  SENOKOT S Take 1 tablet by  mouth at bedtime as needed.   sulfamethoxazole-trimethoprim 800-160 MG tablet Commonly known as:  BACTRIM DS,SEPTRA DS Take 1 tablet by mouth 2 (two) times daily.   telmisartan-hydrochlorothiazide 80-25 MG tablet Commonly known as:  MICARDIS HCT Take 1 tablet by mouth daily.            Durable Medical Equipment        Start     Ordered   06/27/16 1700  DME Walker rolling  Once    Question:  Patient needs a walker to treat with the following condition  Answer:  Total knee replacement status   06/27/16 1659   06/27/16 1700  DME 3 n 1  Once     06/27/16 1659   06/27/16 1700  DME Bedside commode  Once    Question:  Patient needs a bedside commode to treat with the following condition  Answer:  Total knee replacement status   06/27/16 1659      Diagnostic Studies: Dg Knee Left Port  Result Date: 06/27/2016 CLINICAL DATA:  Status post total knee joint prosthesis placement EXAM: PORTABLE LEFT KNEE - 1-2 VIEW COMPARISON:  None in PACs FINDINGS: The patient has undergone right total knee joint prosthesis placement. Radiographic positioning of the prosthetic components is good. The interface with the native bone appears normal. There is soft tissue gas in the joint and along the distal aspect of the femoral shaft. IMPRESSION: There is no immediate postprocedure complication following left total knee joint prosthesis placement. Electronically Signed   By: David  Martinique M.D.   On: 06/27/2016 15:30    Patient benefited maximally from their hospital stay and there were no complications.     Disposition: 03-Skilled Nursing Facility Discharge Instructions    Call MD / Call 911    Complete by:  As directed    If you experience chest pain or shortness of breath, CALL 911 and be transported to the hospital emergency room.  If you develope a fever above 101 F, pus (white drainage) or increased drainage or redness at the wound, or calf pain, call your surgeon's office.   Constipation Prevention     Complete by:  As directed    Drink plenty of fluids.  Prune juice may be helpful.  You may use a stool softener, such as Colace (over the counter) 100 mg twice a day.  Use MiraLax (over the counter) for constipation as needed.   Diet - low sodium heart healthy    Complete by:  As directed    Increase activity slowly as tolerated    Complete by:  As directed      Follow-up Information    Eduard Roux, MD Follow up in 2 week(s).   Specialty:  Orthopedic Surgery Why:  For suture removal, For wound re-check Contact information: [address]  [place] [address] [phone]            Signed: Newt Minion 06/29/2016, 7:33 AM

## 2016-06-29 NOTE — Progress Notes (Signed)
Patient ID: Cassandra Dodson, female   DOB: [birth year], 76 y.o.   MRN: [id number] Patient without complaints and comfortable this morning no nausea or vomiting. Plan for discharge to [facility] placed today.

## 2016-06-29 NOTE — Plan of Care (Signed)
Problem: Physical Regulation: Goal: Will remain free from infection Outcome: Progressing No s/s of infection noted  Problem: Tissue Perfusion: Goal: Risk factors for ineffective tissue perfusion will decrease Outcome: Progressing SCDs are on, no S/S of DVT noted  Problem: Activity: Goal: Risk for activity intolerance will decrease Outcome: Progressing Minimum assistance to [facility], tolerates well  Problem: Bowel/Gastric: Goal: Will not experience complications related to bowel motility Outcome: Progressing Denies bowel issues

## 2016-07-01 ENCOUNTER — Non-Acute Institutional Stay (SKILLED_NURSING_FACILITY): Payer: Medicare Other | Admitting: Adult Health

## 2016-07-01 ENCOUNTER — Encounter: Payer: Self-pay | Admitting: Adult Health

## 2016-07-01 DIAGNOSIS — D62 Acute posthemorrhagic anemia: Secondary | ICD-10-CM

## 2016-07-01 DIAGNOSIS — I10 Essential (primary) hypertension: Secondary | ICD-10-CM

## 2016-07-01 DIAGNOSIS — N183 Chronic kidney disease, stage 3 unspecified: Secondary | ICD-10-CM

## 2016-07-01 DIAGNOSIS — R2681 Unsteadiness on feet: Secondary | ICD-10-CM

## 2016-07-01 DIAGNOSIS — E785 Hyperlipidemia, unspecified: Secondary | ICD-10-CM

## 2016-07-01 DIAGNOSIS — E1122 Type 2 diabetes mellitus with diabetic chronic kidney disease: Secondary | ICD-10-CM | POA: Diagnosis not present

## 2016-07-01 DIAGNOSIS — M1712 Unilateral primary osteoarthritis, left knee: Secondary | ICD-10-CM

## 2016-07-01 DIAGNOSIS — K5900 Constipation, unspecified: Secondary | ICD-10-CM

## 2016-07-01 DIAGNOSIS — E039 Hypothyroidism, unspecified: Secondary | ICD-10-CM

## 2016-07-01 NOTE — Progress Notes (Signed)
DATE:  07/01/2016   MRN:  [id number]  BIRTHDAY: Mar 31, 1941  Facility:  [facility]ursing Home Location:  [place] Room Number: 102-P  LEVEL OF CARE:  SNF (31)  Contact Information    Name Relation Home Work Mobile   [place] Son   [phone]   Lenoir,Alicia Relative   [phone]       Code Status History    Date Active Date Inactive Code Status Order ID Comments User Context   06/27/2016  4:59 PM 06/29/2016  5:30 PM Full Code [id number]  Leandrew Koyanagi, MD Inpatient   02/23/2015  8:09 PM 02/27/2015  6:33 PM Full Code [id number]  Leandrew Koyanagi, MD Inpatient   08/17/2013  1:35 PM 08/18/2013  3:30 AM Full Code [id number]  Dereck Ligas, MD HOV       Chief Complaint  Patient presents with  . Hospitalization Follow-up    HISTORY OF PRESENT ILLNESS:  This is a 48-YO female seen for hospital follow-up.  She was admitted to [facility] 06/29/16 following an admission at [facility] 06/27/2016-06/29/2016 for osteoarthritis of the left knee, S/P left total knee arthroplasty. She has PMH of CKD stage 3, depression, DM, HTN, hypothyroidism and neuropathy.   She was seen in the room and did not verbalize any concerns.   PAST MEDICAL HISTORY:  Past Medical History:  Diagnosis Date  . Arthritis   . CKD (chronic kidney disease), stage III   . Depression   . Diabetes mellitus without complication ([place])   . GERD (gastroesophageal reflux disease)   . Headache   . Hypertension   . Hypothyroidism   . Neuromuscular disorder (HCC)    neuropathy in feet  . Osteoarthritis   . PONV (postoperative nausea and vomiting)    severe nausea and vomiting after knee replacement  . Urinary frequency   . Wears glasses      CURRENT MEDICATIONS: Reviewed  Patient's Medications  New Prescriptions   No medications on file  Previous Medications   ASPIRIN EC 81 MG TABLET    Take 81 mg by mouth daily.   CALCIUM CARB-CHOLECALCIFEROL (CALCIUM 600 + D PO)    Take 1  tablet by mouth daily.   FENOFIBRATE 160 MG TABLET    Take 1 tablet by mouth daily.   HUMALOG MIX 75/25 KWIKPEN (75-25) 100 UNIT/ML KWIKPEN    Inject 18 Units into the skin 2 (two) times daily.    LEVOTHYROXINE (SYNTHROID, LEVOTHROID) 50 MCG TABLET    Take 50 mcg by mouth every evening.   MAGNESIUM HYDROXIDE (MILK OF MAGNESIA) 400 MG/5ML SUSPENSION    Take 30 mLs by mouth daily as needed for mild constipation (For no BM in 3 days per SO).   METFORMIN (GLUCOPHAGE) 1000 MG TABLET    Take 1 tablet by mouth 2 (two) times daily.   METHOCARBAMOL (ROBAXIN) 750 MG TABLET    Take 1 tablet (750 mg total) by mouth 2 (two) times daily as needed for muscle spasms.   MULTIPLE VITAMINS-MINERALS (MULTIVITAMIN WITH MINERALS) TABLET    Take 1 tablet by mouth daily.   ONDANSETRON (ZOFRAN) 4 MG TABLET    Take 4 mg by mouth every 8 (eight) hours as needed for nausea or vomiting.   OXYCODONE (OXY IR/ROXICODONE) 5 MG IMMEDIATE RELEASE TABLET    Take 1-3 tablets (5-15 mg total) by mouth every 4 (four) hours as needed.   OXYCODONE-ACETAMINOPHEN (PERCOCET) 5-325 MG TABLET    Take 1-2  tablets by mouth every 4 (four) hours as needed for severe pain.   PROMETHAZINE (PHENERGAN) 25 MG TABLET    Take 1 tablet (25 mg total) by mouth every 6 (six) hours as needed for nausea.   SENNA-DOCUSATE (SENOKOT S) 8.6-50 MG TABLET    Take 1 tablet by mouth at bedtime as needed.   SULFAMETHOXAZOLE-TRIMETHOPRIM (BACTRIM DS,SEPTRA DS) 800-160 MG TABLET    Take 1 tablet by mouth 2 (two) times daily.   TELMISARTAN-HYDROCHLOROTHIAZIDE (MICARDIS HCT) 80-25 MG TABLET    Take 1 tablet by mouth daily.  Modified Medications   No medications on file  Discontinued Medications   ONDANSETRON (ZOFRAN) 4 MG TABLET    Take 1-2 tablets (4-8 mg total) by mouth every 8 (eight) hours as needed for nausea or vomiting.     Allergies  Allergen Reactions  . Anesthetics, Amide Other (See Comments)    Pt is intolerant to general anesthesia. Pt will throw up and  has thrown up during procedure.   . Ether     UNSPECIFIED REACTION      REVIEW OF SYSTEMS:  GENERAL: no change in appetite, no fatigue, no weight changes, no fever, chills or weakness EYES: Denies change in vision, dry eyes, eye pain, itching or discharge EARS: Denies change in hearing, ringing in ears, or earache NOSE: Denies nasal congestion or epistaxis MOUTH and THROAT: Denies oral discomfort, gingival pain or bleeding, pain from teeth or hoarseness   RESPIRATORY: no cough, SOB, DOE, wheezing, hemoptysis CARDIAC: no chest pain, edema or palpitations GI: no abdominal pain, diarrhea, constipation, heart burn, nausea or vomiting GU: Denies dysuria, frequency, hematuria, incontinence, or discharge PSYCHIATRIC: Denies feeling of depression or anxiety. No report of hallucinations, insomnia, paranoia, or agitation     PHYSICAL EXAMINATION  GENERAL APPEARANCE: Well nourished. In no acute distress. Obese SKIN:  Left knee surgical incision is covered with Aquacel dressing, dry, no erythema HEAD: Normal in size and contour. No evidence of trauma EYES: Lids open and close normally. No blepharitis, entropion or ectropion. PERRL. Conjunctivae are clear and sclerae are white. Lenses are without opacity EARS: Pinnae are normal. Patient hears normal voice tunes of the examiner MOUTH and THROAT: Lips are without lesions. Oral mucosa is moist and without lesions. Tongue is normal in shape, size, and color and without lesions NECK: supple, trachea midline, no neck masses, no thyroid tenderness, no thyromegaly LYMPHATICS: no LAN in the neck, no supraclavicular LAN RESPIRATORY: breathing is even & unlabored, BS CTAB CARDIAC: RRR, no murmur,no extra heart sounds, no edema GI: abdomen soft, normal BS, no masses, no tenderness, no hepatomegaly, no splenomegaly EXTREMITIES:  Able to move 4 extremities PSYCHIATRIC: Alert and oriented X 3. Affect and behavior are appropriate    LABS/RADIOLOGY: Labs  reviewed: Basic Metabolic Panel:  Recent Labs  06/19/16 1404 06/28/16 0750  NA 136 132*  K 3.9 3.7  CL 100* 99*  CO2 24 25  GLUCOSE 100* 202*  BUN 26* 16  CREATININE 1.42* 1.47*  CALCIUM 10.4* 8.3*   Liver Function Tests:  Recent Labs  06/19/16 1404  AST 21  ALT 16  ALKPHOS 49  BILITOT 0.5  PROT 6.6  ALBUMIN 4.0   CBC:  Recent Labs  06/19/16 1404 06/28/16 0750 06/29/16 0716  WBC 9.7 7.5 8.5  NEUTROABS 6.0  --   --   HGB 11.9* 9.7* 9.5*  HCT 37.1 31.0* 29.7*  MCV 86.1 86.4 86.1  PLT 267 191 173   CBG:  Recent Labs  06/29/16 0606 06/29/16 0904 06/29/16 1207  GLUCAP 120* 176* 166*      Dg Knee Left Port  Result Date: 06/27/2016 CLINICAL DATA:  Status post total knee joint prosthesis placement EXAM: PORTABLE LEFT KNEE - 1-2 VIEW COMPARISON:  None in PACs FINDINGS: The patient has undergone right total knee joint prosthesis placement. Radiographic positioning of the prosthetic components is good. The interface with the native bone appears normal. There is soft tissue gas in the joint and along the distal aspect of the femoral shaft. IMPRESSION: There is no immediate postprocedure complication following left total knee joint prosthesis placement. Electronically Signed   By: David  Martinique M.D.   On: 06/27/2016 15:30    ASSESSMENT/PLAN:  Unsteady gait - for rehabilitation, PT and OT, for therapeutic strengthening exercises; fall precautions  Primary osteoarthritis of left knee - S/P left total knee arthroplasty on 06/27/16, follow-up with orthopedic surgeon, Dr. Eduard Roux, in 2 weeks; continue Robaxin 750 mg 1 tab by mouth twice a day when necessary for muscle spasm; oxycodone 5 mg 1-3 tabs by mouth every 4 hours when necessary and Percocet 5/325 mg 1-2 tabs by mouth every 4 hours when necessary for pain; aspirin EC 81 mg 1 tab by mouth daily for DVT prophylaxis  Constipation - continue senna - S 1 tab by mouth daily at bedtime when necessary  Hypertension -  continue Micardis HCT 80-25 mg 1 tab by mouth daily  Hyperlipidemia - continue fenofibrate 160 mg 1 tab by mouth daily  Hypothyroidism - continue Synthroid 50 g 1 tab by mouth daily  Diabetes mellitus, type II - continue Humalog mix 75-25 quick pen inject 18 units subcutaneous BID with meals, Glucophage 1000 mg 1 tab by mouth twice a day Lab Results  Component Value Date   HGBA1C 7.0 (H) 04/30/2016   Chronic kidney disease, stage III - creatinine 1.47; check BMP  Anemia, acute blood loss - hemoglobin 9.5; check CBC     Goals of care:  Short-term rehabilitation    Brylan Dec C. Vernon - NP    [employer] [phone]

## 2016-07-02 LAB — BASIC METABOLIC PANEL
BUN: 24 mg/dL — AB (ref 4–21)
CREATININE: 1.4 mg/dL — AB (ref 0.5–1.1)
Glucose: 130 mg/dL
Potassium: 3.9 mmol/L (ref 3.4–5.3)
Sodium: 134 mmol/L — AB (ref 137–147)

## 2016-07-02 LAB — CBC AND DIFFERENTIAL
HCT: 28 % — AB (ref 36–46)
Hemoglobin: 9.2 g/dL — AB (ref 12.0–16.0)
Neutrophils Absolute: 4 /uL
PLATELETS: 219 10*3/uL (ref 150–399)
WBC: 6.5 10*3/mL

## 2016-07-03 ENCOUNTER — Encounter: Payer: Self-pay | Admitting: Internal Medicine

## 2016-07-03 ENCOUNTER — Non-Acute Institutional Stay (SKILLED_NURSING_FACILITY): Payer: Medicare Other | Admitting: Internal Medicine

## 2016-07-03 DIAGNOSIS — M1712 Unilateral primary osteoarthritis, left knee: Secondary | ICD-10-CM | POA: Diagnosis not present

## 2016-07-03 DIAGNOSIS — N183 Chronic kidney disease, stage 3 unspecified: Secondary | ICD-10-CM

## 2016-07-03 DIAGNOSIS — K5900 Constipation, unspecified: Secondary | ICD-10-CM | POA: Diagnosis not present

## 2016-07-03 DIAGNOSIS — D500 Iron deficiency anemia secondary to blood loss (chronic): Secondary | ICD-10-CM | POA: Diagnosis not present

## 2016-07-03 DIAGNOSIS — E1122 Type 2 diabetes mellitus with diabetic chronic kidney disease: Secondary | ICD-10-CM

## 2016-07-03 DIAGNOSIS — E039 Hypothyroidism, unspecified: Secondary | ICD-10-CM

## 2016-07-03 DIAGNOSIS — R2681 Unsteadiness on feet: Secondary | ICD-10-CM | POA: Diagnosis not present

## 2016-07-03 DIAGNOSIS — Z794 Long term (current) use of insulin: Secondary | ICD-10-CM

## 2016-07-03 DIAGNOSIS — I10 Essential (primary) hypertension: Secondary | ICD-10-CM | POA: Diagnosis not present

## 2016-07-03 NOTE — Progress Notes (Signed)
LOCATION: [place]  PCP: Precious Reel, MD   Code Status: DNR  Goals of care: Advanced Directive information Advanced Directives 07/03/2016  Does Patient Have a Medical Advance Directive? Yes  Type of Advance Directive Out of facility DNR (pink MOST or yellow form)  Does patient want to make changes to medical advance directive? No - Patient declined  Would patient like information on creating a medical advance directive? -       Extended Emergency Contact Information Primary Emergency Contact: Stcharles,Fitch Address: [address] [place], [place] [address] [place] Phone: [phone] Relation: Son Secondary Emergency Contact: Franchot Gallo Address: [address] [place], [place] [address] [place] [employer] [place] Phone: [phone] Relation: Relative   Allergies  Allergen Reactions  . Anesthetics, Amide Other (See Comments)    Pt is intolerant to general anesthesia. Pt will throw up and has thrown up during procedure.   . Ether     UNSPECIFIED REACTION     Chief Complaint  Patient presents with  . New Admit To SNF    New Admission Visit      HPI:  Patient is a 76 y.o. female seen today for short term rehabilitation post hospital admission from 06/27/16-06/29/16 with left knee OA. She underwent left total knee arthroplasty. She is seen in her room today.   Review of Systems:  Constitutional: Negative for fever, chills, diaphoresis.  HENT: Negative for headache, congestion, nasal discharge, sore throat, difficulty swallowing.   Eyes: Negative for blurred vision, double vision and discharge. Wears glasses.  Respiratory: Negative for wheezing. Positive for dry cough and shortness of breath with exertion.   Cardiovascular: Negative for chest pain, palpitations, leg swelling.  Gastrointestinal: Negative for nausea, vomiting, abdominal pain, loss of appetite and constipation. Positive for occasional  heartburn. Last bowel movement was before surgery. At home had a bowel movement every day. Passing gas. Genitourinary: Negative for dysuria and flank pain.  Musculoskeletal: Negative for back pain, fall in the facility. pain medication has been helpful. Skin: Negative for itching, rash.  Neurological: Negative for dizziness. Psychiatric/Behavioral: Negative for depression, anxiety, insomnia.   Past Medical History:  Diagnosis Date  . Arthritis   . CKD (chronic kidney disease), stage III   . Depression   . Diabetes mellitus without complication ([place])   . GERD (gastroesophageal reflux disease)   . Headache   . Hypertension   . Hypothyroidism   . Neuromuscular disorder (HCC)    neuropathy in feet  . Osteoarthritis   . PONV (postoperative nausea and vomiting)    severe nausea and vomiting after knee replacement  . Urinary frequency   . Wears glasses    Past Surgical History:  Procedure Laterality Date  . BREAST SURGERY     cyst removed  . CESAREAN SECTION    . FRACTURE SURGERY     right knee  . IRRIGATION AND DEBRIDEMENT SEBACEOUS CYST    . JOINT REPLACEMENT Right    right  . teeth extration    . TONSILLECTOMY    . TOTAL HIP ARTHROPLASTY Right 02/23/2015   Procedure: RIGHT TOTAL HIP ARTHROPLASTY ANTERIOR APPROACH;  Surgeon: Leandrew Koyanagi, MD;  Location: [facility];  Service: Orthopedics;  Laterality: Right;  . TOTAL KNEE ARTHROPLASTY Left 06/27/2016   Procedure: LEFT TOTAL KNEE ARTHROPLASTY;  Surgeon: Leandrew Koyanagi, MD;  Location: [facility];  Service: Orthopedics;  Laterality: Left;   Social History:   reports that she quit smoking about 14 years ago. Her smoking use included Cigarettes. She has a 67.50 pack-year smoking history. She has never used smokeless tobacco. She reports that she drinks alcohol. She reports that she does not use drugs.  Family History  Problem Relation Age of Onset  . Cancer Mother   . Stroke Father     Medications: Allergies as of 07/03/2016      Reactions    Anesthetics, Amide Other (See Comments)   Pt is intolerant to general anesthesia. Pt will throw up and has thrown up during procedure.    Ether    UNSPECIFIED REACTION       Medication List       Accurate as of 07/03/16  1:18 PM. Always use your most recent med list.          aspirin EC 81 MG tablet Take 81 mg by mouth daily.   CALCIUM 600 + D PO Take 1 tablet by mouth daily.   fenofibrate 160 MG tablet Take 1 tablet by mouth daily.   HUMALOG MIX 75/25 KWIKPEN (75-25) 100 UNIT/ML Kwikpen Generic drug:  Insulin Lispro Prot & Lispro Inject 18 Units into the skin 2 (two) times daily.   levothyroxine 50 MCG tablet Commonly known as:  SYNTHROID, LEVOTHROID Take 50 mcg by mouth every evening.   metFORMIN 1000 MG tablet Commonly known as:  GLUCOPHAGE Take 1 tablet by mouth 2 (two) times daily.   methocarbamol 750 MG tablet Commonly known as:  ROBAXIN Take 1 tablet (750 mg total) by mouth 2 (two) times daily as needed for muscle spasms.   multivitamin with minerals tablet Take 1 tablet by mouth daily.   ondansetron 4 MG tablet Commonly known as:  ZOFRAN Take 4 mg by mouth every 8 (eight) hours as needed for nausea or vomiting.   oxyCODONE 5 MG immediate release tablet Commonly known as:  Oxy IR/ROXICODONE Take 1-3 tablets (5-15 mg total) by mouth every 4 (four) hours as needed.   oxyCODONE-acetaminophen 5-325 MG tablet Commonly known as:  PERCOCET Take 1-2 tablets by mouth every 4 (four) hours as needed for severe pain.   promethazine 25 MG tablet Commonly known as:  PHENERGAN Take 1 tablet (25 mg total) by mouth every 6 (six) hours as needed for nausea.   senna-docusate 8.6-50 MG tablet Commonly known as:  SENOKOT S Take 1 tablet by mouth at bedtime as needed.   sulfamethoxazole-trimethoprim 800-160 MG tablet Commonly known as:  BACTRIM DS,SEPTRA DS Take 1 tablet by mouth 2 (two) times daily.   telmisartan-hydrochlorothiazide 80-25 MG tablet Commonly  known as:  MICARDIS HCT Take 1 tablet by mouth daily.       Immunizations: Immunization History  Administered Date(s) Administered  . PPD Test 02/27/2015     Physical Exam: Vitals:   07/03/16 1314  BP: (!) 158/88  Pulse: 95  Resp: 20  Temp: (!) 96.7 F (35.9 C)  TempSrc: Oral  SpO2: 94%  Weight: 220 lb (99.8 kg)  Height: 5\' 5"  (1.651 m)   Body mass index is 36.61 kg/m.  General- elderly female, obese, in no acute distress Head- normocephalic, atraumatic Nose- no nasal discharge Throat- moist mucus membrane  Eyes- PERRLA, EOMI, no pallor, no icterus, no discharge, normal conjunctiva, normal sclera Neck- no cervical lymphadenopathy Cardiovascular- normal s1,s2, no murmur Respiratory- bilateral clear to auscultation, no wheeze, no rhonchi, no crackles, no use  of accessory muscles Abdomen- bowel sounds present, soft, non tender, no guarding or rigidity Musculoskeletal- able to move all 4 extremities, limited left knee range of motion, trace leg edema Neurological- alert and oriented to person, place and time Skin- warm and dry, left knee surgical incision with aquacel dressing clean and dry Psychiatry- normal mood and affect    Labs reviewed: Basic Metabolic Panel:  Recent Labs  06/19/16 1404 06/28/16 0750  NA 136 132*  K 3.9 3.7  CL 100* 99*  CO2 24 25  GLUCOSE 100* 202*  BUN 26* 16  CREATININE 1.42* 1.47*  CALCIUM 10.4* 8.3*   Liver Function Tests:  Recent Labs  06/19/16 1404  AST 21  ALT 16  ALKPHOS 49  BILITOT 0.5  PROT 6.6  ALBUMIN 4.0   No results for input(s): LIPASE, AMYLASE in the last 8760 hours. No results for input(s): AMMONIA in the last 8760 hours. CBC:  Recent Labs  06/19/16 1404 06/28/16 0750 06/29/16 0716  WBC 9.7 7.5 8.5  NEUTROABS 6.0  --   --   HGB 11.9* 9.7* 9.5*  HCT 37.1 31.0* 29.7*  MCV 86.1 86.4 86.1  PLT 267 191 173   Cardiac Enzymes: No results for input(s): CKTOTAL, CKMB, CKMBINDEX, TROPONINI in the last  8760 hours. BNP: Invalid input(s): POCBNP CBG:  Recent Labs  06/29/16 0606 06/29/16 0904 06/29/16 1207  GLUCAP 120* 176* 166*    Radiological Exams: Dg Knee Left Port  Result Date: 06/27/2016 CLINICAL DATA:  Status post total knee joint prosthesis placement EXAM: PORTABLE LEFT KNEE - 1-2 VIEW COMPARISON:  None in PACs FINDINGS: The patient has undergone right total knee joint prosthesis placement. Radiographic positioning of the prosthetic components is good. The interface with the native bone appears normal. There is soft tissue gas in the joint and along the distal aspect of the femoral shaft. IMPRESSION: There is no immediate postprocedure complication following left total knee joint prosthesis placement. Electronically Signed   By: David  Martinique M.D.   On: 06/27/2016 15:30    Assessment/Plan  Unsteady gait Will have patient work with PT/OT as tolerated to regain strength and restore function.  Fall precautions are in place.  Left knee OA S/p left total knee arthroplasty. Has orthopedic follow up. Will have her work with physical therapy and occupational therapy team to help with gait training and muscle strengthening exercises.fall precautions. Skin care. Encourage to be out of bed. Currently on oxycodone 5 mg 1-3 tablet every 4 hours as needed for pain with Percocet 5-3 25 mg 1-2 tablet every 4 hours as needed for pain. Discontinue OxyIR and only continue Percocet on when necessary basis. Also on Robaxin 750 mg twice a day as needed for muscle spasm. Continue calcium and vitamin D supplement. Get PMR consult. Continue aspirin 81 mg daily for DVT prophylaxis.  Constipation Currently on Senokot S1 tablet daily at bedtime as needed for constipation. Patient has been constipated. Encouraged hydration. Change Senokot-S to 2 tablet daily at bedtime scheduled and also MiraLAX 17 g daily for 3 days, then change to daily as needed monitor.  Acute blood loss anemia Post surgery, monitor  CBC  ckd stage 3 Check bmp  Hypertension Monitor blood pressure reading. Check BMP. Continue Micardis HCTZ 80-25 milligrams daily  Hypothyroidism Continue Synthroid 50 g daily. 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 Continue at form thousand milligrams twice a day Humalog 75-25 8 units twice  a day with meals. Lab Results  Component Value Date   HGBA1C 7.0 (H) 04/30/2016      Goals of care: short term rehabilitation   Labs/tests ordered: cbc, cmp, tsh  Family/ staff Communication: reviewed care plan with patient and nursing supervisor    Blanchie Serve, MD Internal Medicine [place], [place] [address] Cell Phone (Monday-Friday 8 am - 5 pm): [phone] On Call: [phone] and follow prompts after 5 pm and on weekends Office Phone: [phone] Office Fax: [phone]

## 2016-07-05 NOTE — Anesthesia Postprocedure Evaluation (Addendum)
Anesthesia Post Note  Patient: DAIJANAE RAFALSKI  Procedure(s) Performed: Procedure(s) (LRB): LEFT TOTAL KNEE ARTHROPLASTY (Left)  Patient location during evaluation: PACU Anesthesia Type: General Level of consciousness: awake and alert Pain management: pain level controlled Vital Signs Assessment: post-procedure vital signs reviewed and stable Respiratory status: spontaneous breathing, nonlabored ventilation, respiratory function stable and patient connected to nasal cannula oxygen Cardiovascular status: blood pressure returned to baseline and stable Postop Assessment: no signs of nausea or vomiting Anesthetic complications: no        Last Vitals:  Vitals:   06/29/16 0519 06/29/16 0900  BP: 95/72 (!) 114/41  Pulse: 85 78  Resp: 16   Temp: 36.9 C     Last Pain:  Vitals:   06/29/16 1302  TempSrc:   PainSc: 3    Pain Goal: Patients Stated Pain Goal: 0 (06/29/16 0649)               Effie Berkshire

## 2016-07-09 ENCOUNTER — Ambulatory Visit (INDEPENDENT_AMBULATORY_CARE_PROVIDER_SITE_OTHER): Payer: Medicare Other | Admitting: Orthopaedic Surgery

## 2016-07-09 ENCOUNTER — Encounter (INDEPENDENT_AMBULATORY_CARE_PROVIDER_SITE_OTHER): Payer: Self-pay | Admitting: Orthopaedic Surgery

## 2016-07-09 DIAGNOSIS — M1712 Unilateral primary osteoarthritis, left knee: Secondary | ICD-10-CM

## 2016-07-09 LAB — BASIC METABOLIC PANEL
BUN: 41 mg/dL — AB (ref 4–21)
Creatinine: 1.9 mg/dL — AB (ref 0.5–1.1)
GLUCOSE: 142 mg/dL
Potassium: 4.3 mmol/L (ref 3.4–5.3)
SODIUM: 134 mmol/L — AB (ref 137–147)

## 2016-07-09 NOTE — Progress Notes (Signed)
2 week TKA follow up plan  Patient presents for 2 week follow up after total knee replacement.  The incision is clean, dry, and intact and healing very well. There is no drainage, erythema, or signs of infection. Motion is progressing nicely.  We have encouraged continued use of TED hose as well as aspirin for DVT prophylaxis, and to continue with physical therapy exercises to work on strength and endurance. Patient is progressing well. Reminders were given about signs to be aware of including redness, drainage, increased pain, fevers, calf pain, shortness of breath, or any concern should generate a phone call or a return to see [place] immediately. Will plan to follow up at 6 weeks post op for next evaluation with radiographs at that time including AP and lateral radiographs of the left knee.

## 2016-07-11 ENCOUNTER — Encounter: Payer: Self-pay | Admitting: Adult Health

## 2016-07-11 ENCOUNTER — Non-Acute Institutional Stay (SKILLED_NURSING_FACILITY): Payer: Medicare Other | Admitting: Adult Health

## 2016-07-11 DIAGNOSIS — N183 Chronic kidney disease, stage 3 unspecified: Secondary | ICD-10-CM

## 2016-07-11 DIAGNOSIS — E1122 Type 2 diabetes mellitus with diabetic chronic kidney disease: Secondary | ICD-10-CM

## 2016-07-11 DIAGNOSIS — I10 Essential (primary) hypertension: Secondary | ICD-10-CM | POA: Diagnosis not present

## 2016-07-11 DIAGNOSIS — Z794 Long term (current) use of insulin: Secondary | ICD-10-CM

## 2016-07-11 DIAGNOSIS — E039 Hypothyroidism, unspecified: Secondary | ICD-10-CM | POA: Diagnosis not present

## 2016-07-11 DIAGNOSIS — E785 Hyperlipidemia, unspecified: Secondary | ICD-10-CM

## 2016-07-11 DIAGNOSIS — M1712 Unilateral primary osteoarthritis, left knee: Secondary | ICD-10-CM

## 2016-07-11 DIAGNOSIS — R2681 Unsteadiness on feet: Secondary | ICD-10-CM

## 2016-07-11 DIAGNOSIS — K5900 Constipation, unspecified: Secondary | ICD-10-CM

## 2016-07-11 DIAGNOSIS — D62 Acute posthemorrhagic anemia: Secondary | ICD-10-CM

## 2016-07-11 NOTE — Progress Notes (Signed)
Patient ID: Cassandra Dodson, female   DOB: 30-Oct-1940, 76 y.o.   MRN: [id number]    DATE:  07/11/2016   MRN:  [id number]  BIRTHDAY: 10-30-1940  Facility:  [facility]ursing Home Location:  [place] Room Number: 102-P  LEVEL OF CARE:  SNF (31)  Contact Information    Name Relation Home Work Mobile   [place] Son   [phone]   Mulvihill,Alicia Relative   [phone]       Code Status History    Date Active Date Inactive Code Status Order ID Comments User Context   06/27/2016  4:59 PM 06/29/2016  5:30 PM Full Code [id number]  Leandrew Koyanagi, MD Inpatient   02/23/2015  8:09 PM 02/27/2015  6:33 PM Full Code [id number]  Leandrew Koyanagi, MD Inpatient   08/17/2013  1:35 PM 08/18/2013  3:30 AM Full Code [id number]  Dereck Ligas, MD HOV       Chief Complaint  Patient presents with  . Discharge Note    HISTORY OF PRESENT ILLNESS:  This is a 5-YO female who is for discharge home with Home health PT, OT, CNA and Nursing.  She was admitted to [facility] 06/29/16 following an admission at [facility] 06/27/2016-06/29/2016 for osteoarthritis of the left knee, S/P left total knee arthroplasty. She has PMH of CKD stage 3, depression, DM, HTN, hypothyroidism and neuropathy.   Patient was admitted to this facility for short-term rehabilitation after the patient's recent hospitalization.  Patient has completed SNF rehabilitation and therapy has cleared the patient for discharge.   PAST MEDICAL HISTORY:  Past Medical History:  Diagnosis Date  . Arthritis   . CKD (chronic kidney disease), stage III   . Depression   . Diabetes mellitus without complication ([place])   . GERD (gastroesophageal reflux disease)   . Headache   . Hypertension   . Hypothyroidism   . Neuromuscular disorder (HCC)    neuropathy in feet  . Osteoarthritis   . PONV (postoperative nausea and vomiting)    severe nausea and vomiting after knee replacement  . Urinary frequency   . Wears glasses       CURRENT MEDICATIONS: Reviewed  Patient's Medications  New Prescriptions   No medications on file  Previous Medications   ASPIRIN EC 81 MG TABLET    Take 81 mg by mouth daily.   CALCIUM CARB-CHOLECALCIFEROL (CALCIUM 600 + D PO)    Take 1 tablet by mouth daily.   FENOFIBRATE 160 MG TABLET    Take 1 tablet by mouth daily.   FERROUS SULFATE 325 (65 FE) MG TABLET    Take 325 mg by mouth daily with breakfast.   HUMALOG MIX 75/25 KWIKPEN (75-25) 100 UNIT/ML KWIKPEN    Inject 18 Units into the skin 2 (two) times daily.    LEVOTHYROXINE (SYNTHROID, LEVOTHROID) 50 MCG TABLET    Take 50 mcg by mouth every evening.   METFORMIN (GLUCOPHAGE) 1000 MG TABLET    Take 1 tablet by mouth 2 (two) times daily.   METHOCARBAMOL (ROBAXIN) 750 MG TABLET    Take 1 tablet (750 mg total) by mouth 2 (two) times daily as needed for muscle spasms.   MULTIPLE VITAMINS-MINERALS (MULTIVITAMIN WITH MINERALS) TABLET    Take 1 tablet by mouth daily.   ONDANSETRON (ZOFRAN) 4 MG TABLET    Take 4 mg by mouth every 8 (eight) hours as needed for nausea or vomiting.   OXYCODONE-ACETAMINOPHEN (PERCOCET) 5-325 MG TABLET  Take 1-2 tablets by mouth every 4 (four) hours as needed for severe pain.   POLYETHYLENE GLYCOL (MIRALAX / GLYCOLAX) PACKET    Take 17 g by mouth daily as needed for mild constipation.   PROMETHAZINE (PHENERGAN) 25 MG TABLET    Take 1 tablet (25 mg total) by mouth every 6 (six) hours as needed for nausea.   SENNA-DOCUSATE (SENOKOT S) 8.6-50 MG TABLET    Take 1 tablet by mouth at bedtime as needed.   TELMISARTAN-HYDROCHLOROTHIAZIDE (MICARDIS HCT) 80-25 MG TABLET    Take 1 tablet by mouth daily.  Modified Medications   No medications on file  Discontinued Medications   OXYCODONE (OXY IR/ROXICODONE) 5 MG IMMEDIATE RELEASE TABLET    Take 1-3 tablets (5-15 mg total) by mouth every 4 (four) hours as needed.   SULFAMETHOXAZOLE-TRIMETHOPRIM (BACTRIM DS,SEPTRA DS) 800-160 MG TABLET    Take 1 tablet by mouth 2 (two)  times daily.     Allergies  Allergen Reactions  . Anesthetics, Amide Other (See Comments)    Pt is intolerant to general anesthesia. Pt will throw up and has thrown up during procedure.   . Ether     UNSPECIFIED REACTION      REVIEW OF SYSTEMS:  GENERAL: no change in appetite, no fatigue, no weight changes, no fever, chills or weakness EYES: Denies change in vision, dry eyes, eye pain, itching or discharge EARS: Denies change in hearing, ringing in ears, or earache NOSE: Denies nasal congestion or epistaxis MOUTH and THROAT: Denies oral discomfort, gingival pain or bleeding, pain from teeth or hoarseness   RESPIRATORY: no cough, SOB, DOE, wheezing, hemoptysis CARDIAC: no chest pain, edema or palpitations GI: no abdominal pain, diarrhea, constipation, heart burn, nausea or vomiting GU: Denies dysuria, frequency, hematuria, incontinence, or discharge PSYCHIATRIC: Denies feeling of depression or anxiety. No report of hallucinations, insomnia, paranoia, or agitation     PHYSICAL EXAMINATION  GENERAL APPEARANCE: Well nourished. In no acute distress. Obese SKIN:  Left knee surgical incision is  dry, no erythema HEAD: Normal in size and contour. No evidence of trauma EYES: Lids open and close normally. No blepharitis, entropion or ectropion. PERRL. Conjunctivae are clear and sclerae are white. Lenses are without opacity EARS: Pinnae are normal. Patient hears normal voice tunes of the examiner MOUTH and THROAT: Lips are without lesions. Oral mucosa is moist and without lesions. Tongue is normal in shape, size, and color and without lesions NECK: supple, trachea midline, no neck masses, no thyroid tenderness, no thyromegaly LYMPHATICS: no LAN in the neck, no supraclavicular LAN RESPIRATORY: breathing is even & unlabored, BS CTAB CARDIAC: RRR, no murmur,no extra heart sounds, no edema GI: abdomen soft, normal BS, no masses, no tenderness, no hepatomegaly, no splenomegaly EXTREMITIES:   Able to move 4 extremities PSYCHIATRIC: Alert and oriented X 3. Affect and behavior are appropriate    LABS/RADIOLOGY: Labs reviewed: Basic Metabolic Panel:  Recent Labs  06/19/16 1404 06/28/16 0750 07/02/16 07/09/16  NA 136 132* 134* 134*  K 3.9 3.7 3.9 4.3  CL 100* 99*  --   --   CO2 24 25  --   --   GLUCOSE 100* 202*  --   --   BUN 26* 16 24* 41*  CREATININE 1.42* 1.47* 1.4* 1.9*  CALCIUM 10.4* 8.3*  --   --    Liver Function Tests:  Recent Labs  06/19/16 1404  AST 21  ALT 16  ALKPHOS 49  BILITOT 0.5  PROT 6.6  ALBUMIN 4.0  CBC:  Recent Labs  06/19/16 1404 06/28/16 0750 06/29/16 0716 07/02/16  WBC 9.7 7.5 8.5 6.5  NEUTROABS 6.0  --   --  4  HGB 11.9* 9.7* 9.5* 9.2*  HCT 37.1 31.0* 29.7* 28*  MCV 86.1 86.4 86.1  --   PLT 267 191 173 219   CBG:  Recent Labs  06/29/16 0606 06/29/16 0904 06/29/16 1207  GLUCAP 120* 176* 166*      Dg Knee Left Port  Result Date: 06/27/2016 CLINICAL DATA:  Status post total knee joint prosthesis placement EXAM: PORTABLE LEFT KNEE - 1-2 VIEW COMPARISON:  None in PACs FINDINGS: The patient has undergone right total knee joint prosthesis placement. Radiographic positioning of the prosthetic components is good. The interface with the native bone appears normal. There is soft tissue gas in the joint and along the distal aspect of the femoral shaft. IMPRESSION: There is no immediate postprocedure complication following left total knee joint prosthesis placement. Electronically Signed   By: David  Martinique M.D.   On: 06/27/2016 15:30    ASSESSMENT/PLAN:  Unsteady gait - for rehabilitation, PT and OT, for therapeutic strengthening exercises; fall precautions  Primary osteoarthritis of left knee - S/P left total knee arthroplasty on 06/27/16, follow-up with orthopedic surgeon, Dr. Eduard Roux ; continue Robaxin 750 mg 1 tab by mouth twice a day when necessary for muscle spasm; Percocet 5/325 mg 1-2 tabs by mouth every 4 hours when  necessary for pain; aspirin EC 81 mg 1 tab by mouth daily for DVT prophylaxis  Constipation - continue senna - S 2 tabs by mouth daily at bedtime and MiraLAX 17 g by mouth daily when necessary  Hypertension - well-controlled; continue Micardis HCT 80-25 mg 1 tab by mouth daily  Hyperlipidemia - continue fenofibrate 160 mg 1 tab by mouth daily  Hypothyroidism - continue Synthroid 50 g 1 tab by mouth daily  Diabetes mellitus, type II - continue Humalog mix 75-25 quick pen inject 18 units subcutaneous BID with meals, Glucophage 1000 mg 1 tab by mouth twice a day Lab Results  Component Value Date   HGBA1C 7.0 (H) 04/30/2016   Chronic kidney disease, stage III - creatinine 1.91; check BMP on 07/12/16  Anemia, acute blood loss - continue ferrous sulfate 325 mg 1 tab by mouth daily Lab Results  Component Value Date   HGB 9.2 (A) 07/02/2016        I have filled out patient's discharge paperwork and written prescriptions.  Patient will receive home health PT, OT, Nursing and CNA.  DME provided:  Rolling walker and bedside commode  Total discharge time: Greater than 30 minutes Greater than 50% was spent in counseling and coordination of care with the patient.   Discharge time involved coordination of the discharge process with social worker, nursing staff and therapy department. Medical justification for home health services/DME verified.   Orlan Aversa C. [place] - NP    [employer] [phone]

## 2016-07-15 ENCOUNTER — Telehealth (INDEPENDENT_AMBULATORY_CARE_PROVIDER_SITE_OTHER): Payer: Self-pay | Admitting: Orthopaedic Surgery

## 2016-07-15 NOTE — Telephone Encounter (Signed)
Needing verbal approval for PT 3 times a week for 2 weeks. CB # [phone]

## 2016-07-16 NOTE — Telephone Encounter (Signed)
Please advise 

## 2016-07-16 NOTE — Telephone Encounter (Signed)
yes

## 2016-07-16 NOTE — Telephone Encounter (Signed)
Called Cindee Salt to advise on message

## 2016-07-17 ENCOUNTER — Telehealth (INDEPENDENT_AMBULATORY_CARE_PROVIDER_SITE_OTHER): Payer: Self-pay | Admitting: Orthopaedic Surgery

## 2016-07-17 NOTE — Telephone Encounter (Signed)
Yes #30

## 2016-07-17 NOTE — Telephone Encounter (Signed)
Patient called needing a refill on hydrocodone. CB # [phone]

## 2016-07-17 NOTE — Telephone Encounter (Signed)
Please advise 

## 2016-07-17 NOTE — Telephone Encounter (Signed)
Patient called again and is very upset that she does not have her prescription ready for her son to pick up.  She was told again that it would take 24-48 hours for any prescription refill.  She was also told that Dr. Erlinda Hong is in surgery all day.

## 2016-07-18 ENCOUNTER — Other Ambulatory Visit (INDEPENDENT_AMBULATORY_CARE_PROVIDER_SITE_OTHER): Payer: Self-pay

## 2016-07-18 MED ORDER — OXYCODONE-ACETAMINOPHEN 5-325 MG PO TABS: 1.0000 | ORAL_TABLET | Freq: Every day | ORAL | 0 refills | Status: DC

## 2016-07-23 ENCOUNTER — Other Ambulatory Visit (INDEPENDENT_AMBULATORY_CARE_PROVIDER_SITE_OTHER): Payer: Self-pay

## 2016-07-23 ENCOUNTER — Telehealth (INDEPENDENT_AMBULATORY_CARE_PROVIDER_SITE_OTHER): Payer: Self-pay | Admitting: *Deleted

## 2016-07-23 MED ORDER — OXYCODONE-ACETAMINOPHEN 5-325 MG PO TABS: 1.0000 | ORAL_TABLET | Freq: Four times a day (QID) | ORAL | 0 refills | Status: DC | PRN

## 2016-07-23 NOTE — Telephone Encounter (Signed)
Patient called in this morning in regards to needing a prescription refill on her Oxycodone? Her CB # [phone]. Thank you.

## 2016-07-23 NOTE — Telephone Encounter (Signed)
rx printed per gil and is ready for pick up

## 2016-07-23 NOTE — Telephone Encounter (Signed)
Okay to fill prescription bring it by and I'll sign it

## 2016-07-23 NOTE — Telephone Encounter (Signed)
Can you please advise on message since Dr Erlinda Hong is out all week. Thanks

## 2016-07-31 ENCOUNTER — Telehealth (INDEPENDENT_AMBULATORY_CARE_PROVIDER_SITE_OTHER): Payer: Self-pay | Admitting: Orthopaedic Surgery

## 2016-07-31 NOTE — Telephone Encounter (Signed)
Rx Oxycodone

## 2016-08-01 ENCOUNTER — Other Ambulatory Visit (INDEPENDENT_AMBULATORY_CARE_PROVIDER_SITE_OTHER): Payer: Self-pay | Admitting: Family

## 2016-08-01 MED ORDER — OXYCODONE-ACETAMINOPHEN 5-325 MG PO TABS: 1.0000 | ORAL_TABLET | Freq: Two times a day (BID) | ORAL | 0 refills | Status: DC | PRN

## 2016-08-01 NOTE — Telephone Encounter (Signed)
Sure

## 2016-08-01 NOTE — Telephone Encounter (Signed)
Can you sign for Dr Erlinda Hong since he will not be here this PM nor tomorrow?

## 2016-08-01 NOTE — Telephone Encounter (Signed)
Please advise 

## 2016-08-01 NOTE — Telephone Encounter (Signed)
#  30. 1 tab po bid prn

## 2016-08-02 NOTE — Telephone Encounter (Signed)
RX READY FOR PICK UP.  CALLED PT.  PT AWARE.

## 2016-08-06 ENCOUNTER — Ambulatory Visit (INDEPENDENT_AMBULATORY_CARE_PROVIDER_SITE_OTHER): Payer: Medicare Other

## 2016-08-06 ENCOUNTER — Encounter (INDEPENDENT_AMBULATORY_CARE_PROVIDER_SITE_OTHER): Payer: Self-pay | Admitting: Orthopaedic Surgery

## 2016-08-06 ENCOUNTER — Ambulatory Visit (INDEPENDENT_AMBULATORY_CARE_PROVIDER_SITE_OTHER): Payer: Medicare Other | Admitting: Orthopaedic Surgery

## 2016-08-06 DIAGNOSIS — M1712 Unilateral primary osteoarthritis, left knee: Secondary | ICD-10-CM

## 2016-08-06 NOTE — Progress Notes (Signed)
Cassandra Dodson is 6 weeks status post left total knee replacement. She is doing well and has cleared home health physical therapy. Her range of motion is progressing really well. Her surgical scar is healed. Her x-ray show stable total knee replacement in good alignment. At this point I would like her to start outpatient physical therapy. She can discontinue aspirin. She can resume her baby aspirin.I'll see her back in 6 weeks for recheck.  No x-rays needed.

## 2016-08-07 ENCOUNTER — Telehealth (INDEPENDENT_AMBULATORY_CARE_PROVIDER_SITE_OTHER): Payer: Self-pay | Admitting: Orthopaedic Surgery

## 2016-08-07 NOTE — Telephone Encounter (Signed)
See message below. Rx was printed 08/01/16 and was picked up by her friend Levander Campion.  Please advise

## 2016-08-07 NOTE — Telephone Encounter (Signed)
Rx refill Oxycodone °

## 2016-08-07 NOTE — Telephone Encounter (Signed)
30

## 2016-08-08 MED ORDER — OXYCODONE-ACETAMINOPHEN 5-325 MG PO TABS: 1.0000 | ORAL_TABLET | Freq: Two times a day (BID) | ORAL | 0 refills | Status: DC | PRN

## 2016-08-08 NOTE — Telephone Encounter (Signed)
RX READY FOR PICK UP, PT AWARE

## 2016-08-09 ENCOUNTER — Telehealth (INDEPENDENT_AMBULATORY_CARE_PROVIDER_SITE_OTHER): Payer: Self-pay | Admitting: Orthopaedic Surgery

## 2016-08-09 NOTE — Telephone Encounter (Signed)
Cassandra Dodson from [place], states patient request a early Rx refill on Percocet.

## 2016-08-09 NOTE — Telephone Encounter (Signed)
Patient called advised the pharmacy told her the Rx for Oxycodone can not be filled until the end of the month. Patient said she need the medication now. Patient said it has never been that way before. The number to contact patient is [phone]

## 2016-08-12 NOTE — Telephone Encounter (Signed)
See message below. See previous Rx Refills

## 2016-08-12 NOTE — Telephone Encounter (Signed)
Call pharmacy and tell them it's ok

## 2016-08-13 NOTE — Telephone Encounter (Signed)
Called pharm yesterday ok per Dr Erlinda Hong

## 2016-09-17 ENCOUNTER — Ambulatory Visit (INDEPENDENT_AMBULATORY_CARE_PROVIDER_SITE_OTHER): Payer: Medicare Other | Admitting: Orthopaedic Surgery

## 2016-09-20 NOTE — Addendum Note (Signed)
Addendum  created 09/20/16 1102 by Effie Berkshire, MD   Sign clinical note

## 2016-09-23 ENCOUNTER — Ambulatory Visit (INDEPENDENT_AMBULATORY_CARE_PROVIDER_SITE_OTHER): Payer: Medicare Other | Admitting: Orthopaedic Surgery

## 2016-09-23 DIAGNOSIS — M1712 Unilateral primary osteoarthritis, left knee: Secondary | ICD-10-CM

## 2016-09-23 NOTE — Progress Notes (Signed)
Patient is 3 months status post left total knee replacement. She is working with physical therapy and will be discharged soon. Her range of motion is excellent. Her surgical scars fully heal. She denies any pain. She walks with a cane currently. She is doing well from my standpoint. I would like to see her back in 3 months for her 6 month visit on her left knee with 2 view x-rays of the left knee. I'll like also like to get a standing pelvis for follow-up of her hip replacement.

## 2016-10-07 ENCOUNTER — Other Ambulatory Visit: Payer: Self-pay | Admitting: Adult Health

## 2016-12-24 ENCOUNTER — Ambulatory Visit (INDEPENDENT_AMBULATORY_CARE_PROVIDER_SITE_OTHER): Payer: Self-pay

## 2016-12-24 ENCOUNTER — Ambulatory Visit (INDEPENDENT_AMBULATORY_CARE_PROVIDER_SITE_OTHER): Payer: Medicare Other | Admitting: Orthopaedic Surgery

## 2016-12-24 DIAGNOSIS — M1611 Unilateral primary osteoarthritis, right hip: Secondary | ICD-10-CM

## 2016-12-24 DIAGNOSIS — M1712 Unilateral primary osteoarthritis, left knee: Secondary | ICD-10-CM

## 2016-12-24 NOTE — Progress Notes (Signed)
Office Visit Note   Patient: Cassandra Dodson           Date of Birth: [birth year]           MRN: [id number] Visit Date: 12/24/2016              Requested by: Shon Baton, MD [address] [place], [place] [address] PCP: Shon Baton, MD   Assessment & Plan: Visit Diagnoses:  1. Primary osteoarthritis of right hip   2. Primary osteoarthritis of left knee     Plan: Overall patient is doing very well. Dental prophylaxis was reinforced. Follow-up in 6 months for her 1 year visit for her left knee. Needs 2 view x-rays of left knee on return  Follow-Up Instructions: Return in about 6 months (around 06/23/2017).   Orders:  Orders Placed This Encounter  Procedures  . XR Pelvis 1-2 Views  . XR Knee 1-2 Views Left   No orders of the defined types were placed in this encounter.     Procedures: No procedures performed   Clinical Data: No additional findings.   Subjective: Chief Complaint  Patient presents with  . Left Knee - Pain, Follow-up  . Right Hip - Pain, Follow-up    Kerigan is 2 years status post right total hip replacement in 6 months status post left total knee replacement. She is doing well. She is happy with her progress and recovery. She has no complaints.    Review of Systems   Objective: Vital Signs: There were no vitals taken for this visit.  Physical Exam  Ortho Exam Right hip and left knee exams are benign. Fully healed surgical scars. Painless range of motion the hip. Specialty Comments:  No specialty comments available.  Imaging: Xr Knee 1-2 Views Left  Result Date: 12/24/2016 Stable left total knee replacement in good alignment  Xr Pelvis 1-2 Views  Result Date: 12/24/2016 Stable right total hip replacement in good alignment    PMFS History: Patient Active Problem List   Diagnosis Date Noted  . Total knee replacement status 06/27/2016  . Primary osteoarthritis of left knee 04/30/2016  . 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 Hip  arthritis 02/23/2015   Past Medical History:  Diagnosis Date  . Arthritis   . CKD (chronic kidney disease), stage III   . Depression   . Diabetes mellitus without complication ([place])   . GERD (gastroesophageal reflux disease)   . Headache   . Hypertension   . Hypothyroidism   . Neuromuscular disorder (HCC)    neuropathy in feet  . Osteoarthritis   . PONV (postoperative nausea and vomiting)    severe nausea and vomiting after knee replacement  . Urinary frequency   . Wears glasses     Family History  Problem Relation Age of Onset  . Cancer Mother   . Stroke Father     Past Surgical History:  Procedure Laterality Date  . BREAST SURGERY     cyst removed  . CESAREAN SECTION    . FRACTURE SURGERY     right knee  . IRRIGATION AND DEBRIDEMENT SEBACEOUS CYST    . JOINT REPLACEMENT Right    right  . teeth extration    . TONSILLECTOMY    . TOTAL HIP ARTHROPLASTY Right 02/23/2015   Procedure: RIGHT TOTAL HIP ARTHROPLASTY ANTERIOR APPROACH;  Surgeon: Leandrew Koyanagi, MD;  Location: [place];  Service: Orthopedics;  Laterality: Right;  . TOTAL KNEE ARTHROPLASTY Left 06/27/2016   Procedure: LEFT TOTAL  KNEE ARTHROPLASTY;  Surgeon: Leandrew Koyanagi, MD;  Location: [place];  Service: Orthopedics;  Laterality: Left;   Social History   Occupational History  . Not on file.   Social History Main Topics  . Smoking status: Former Smoker    Packs/day: 1.50    Years: 45.00    Types: Cigarettes    Quit date: 04/22/2002  . Smokeless tobacco: Never Used  . Alcohol use Yes     Comment: rarely  . Drug use: No  . Sexual activity: Not on file

## 2017-06-23 ENCOUNTER — Ambulatory Visit (INDEPENDENT_AMBULATORY_CARE_PROVIDER_SITE_OTHER): Payer: Medicare Other | Admitting: Orthopaedic Surgery

## 2017-10-13 ENCOUNTER — Other Ambulatory Visit: Payer: Self-pay | Admitting: Internal Medicine

## 2017-10-13 DIAGNOSIS — R110 Nausea: Secondary | ICD-10-CM

## 2017-10-27 ENCOUNTER — Other Ambulatory Visit: Payer: Medicare Other

## 2017-10-28 ENCOUNTER — Ambulatory Visit
Admission: RE | Admit: 2017-10-28 | Discharge: 2017-10-28 | Disposition: A | Discharge status: Home / Self Care | Payer: Medicare Other | Source: Ambulatory Visit | Attending: Internal Medicine | Admitting: Internal Medicine

## 2017-10-28 DIAGNOSIS — R110 Nausea: Secondary | ICD-10-CM

## 2018-12-19 IMAGING — US US ABDOMEN COMPLETE
1 series · 13 of 25 positions shown · non-contrast
Comparison: CT abdomen and pelvis 10/27/2015.

CLINICAL DATA: [DATE] month history of RIGHT UPPER QUADRANT abdominal
pain, nausea and vomiting, and diarrhea.

EXAM:
ABDOMEN ULTRASOUND COMPLETE

[Series 1: us abdomen complete · 0.20mm/px · 13 of 84 slices shown]
[im 1/84]
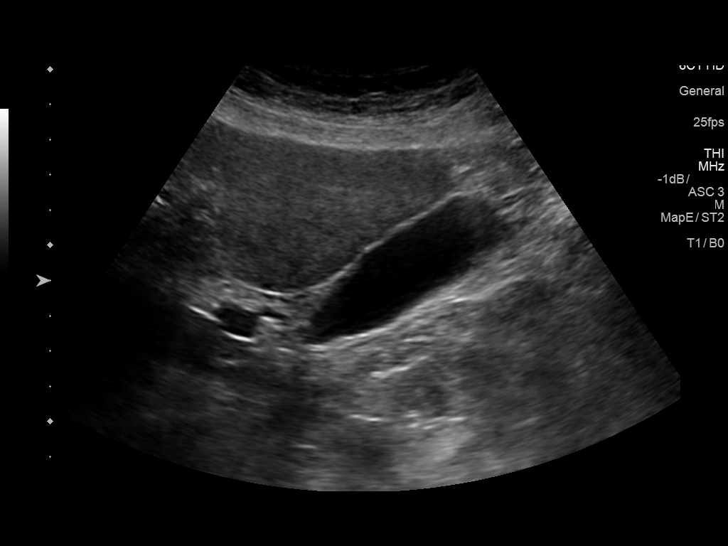
[im 7/84]
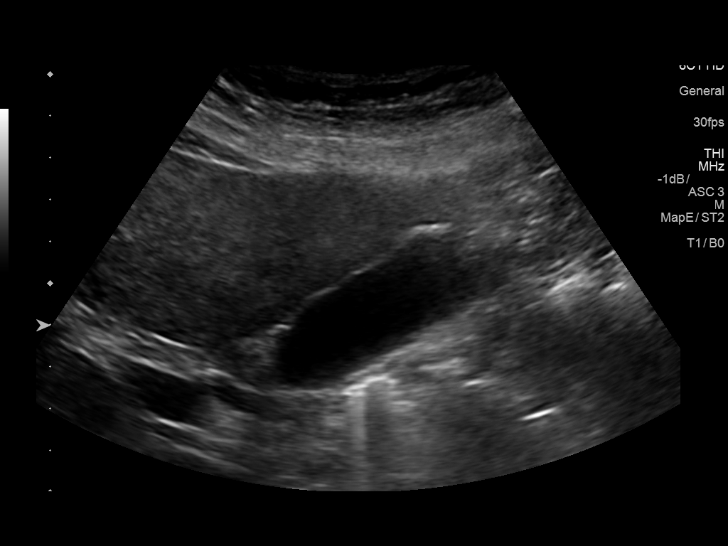
[im 14/84]
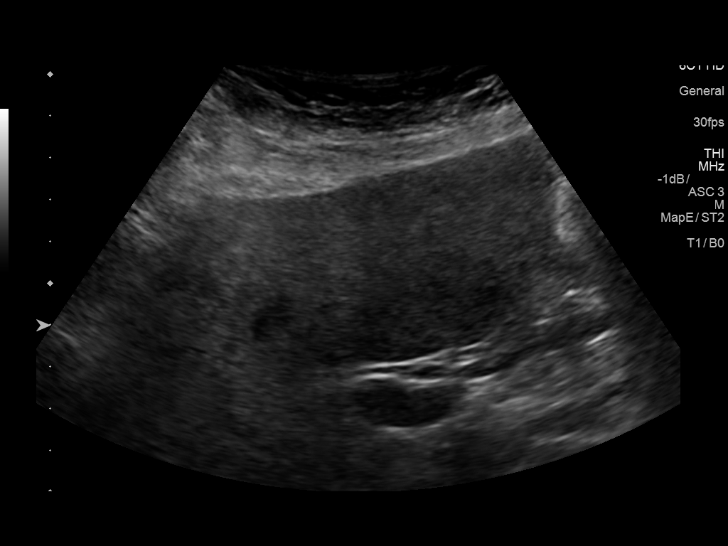
[im 21/84]
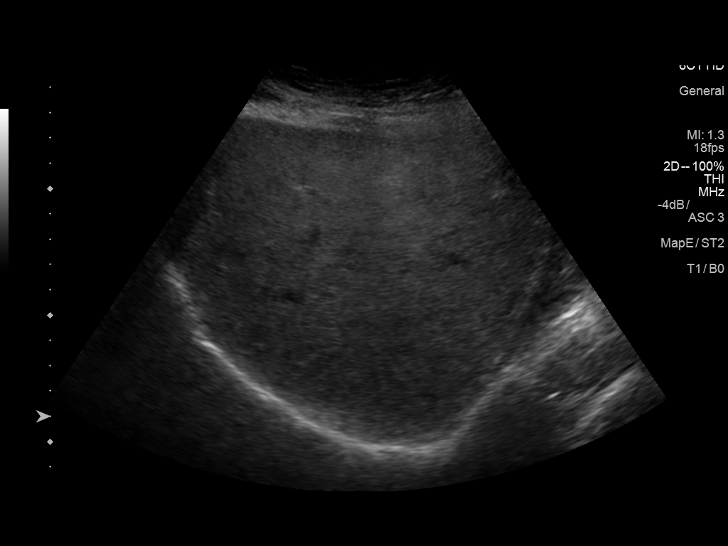
[im 28/84]
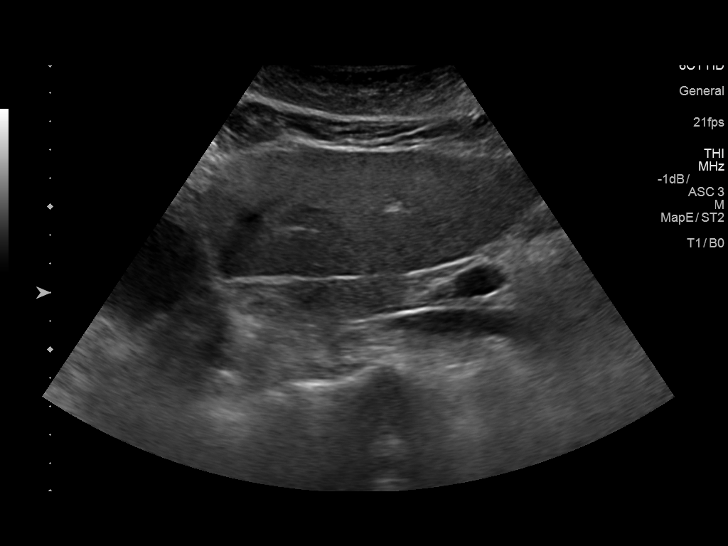
[im 35/84]
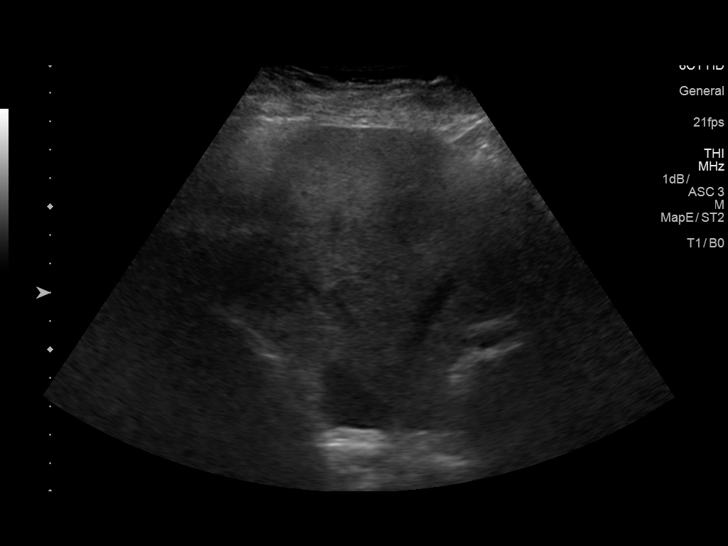
[im 42/84]
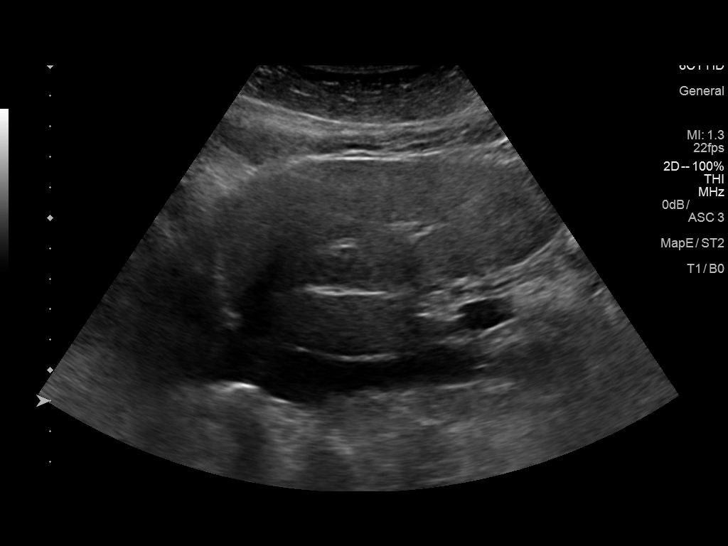
[im 49/84]
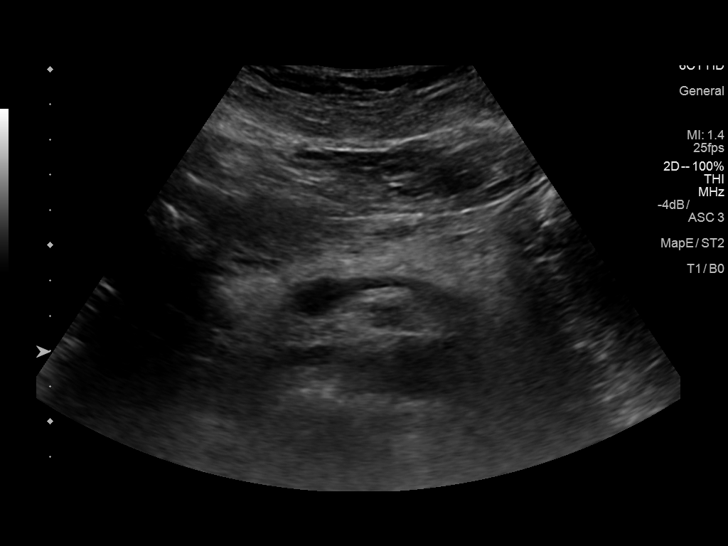
[im 56/84]
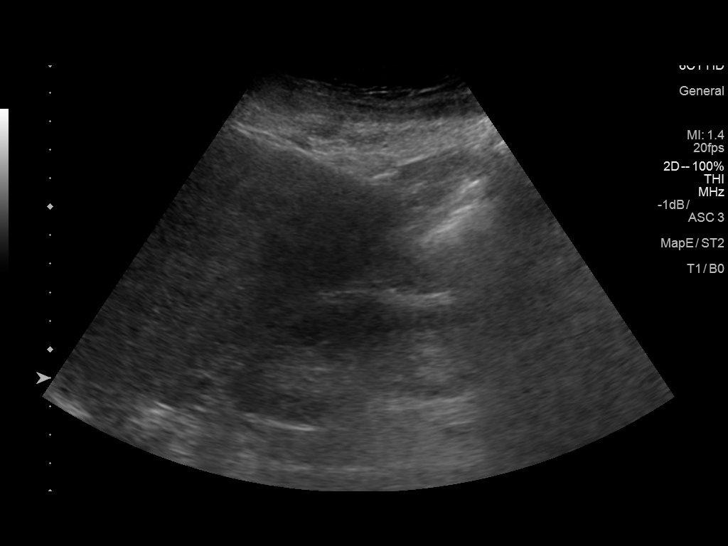
[im 63/84]
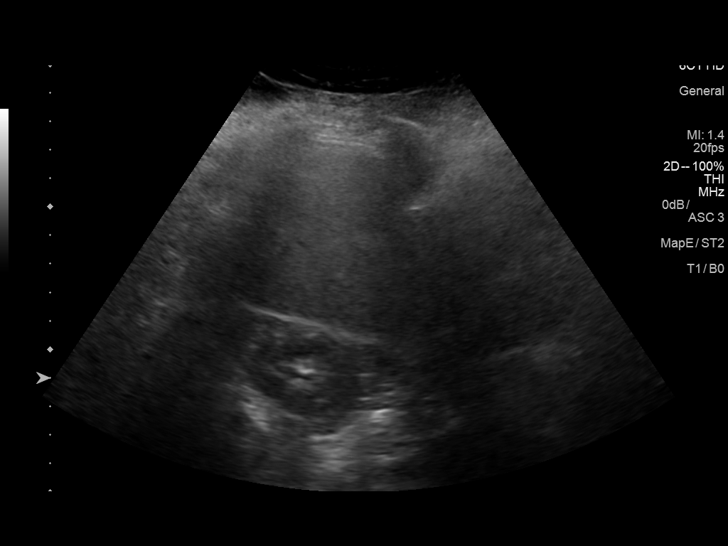
[im 70/84]
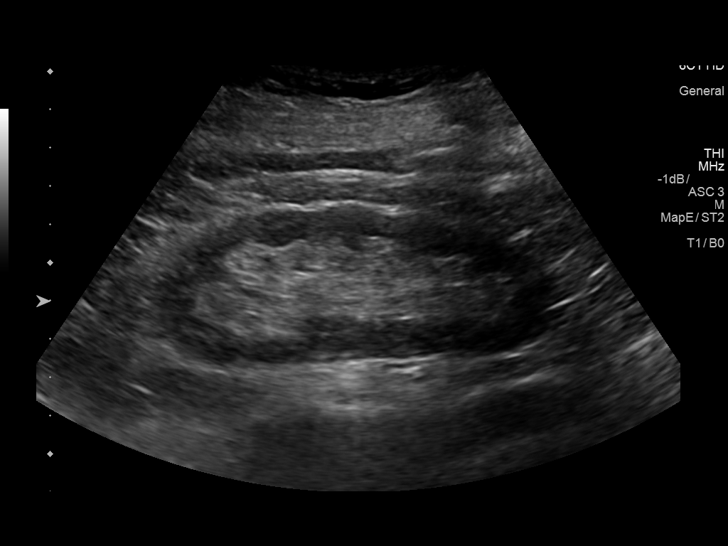
[im 77/84]
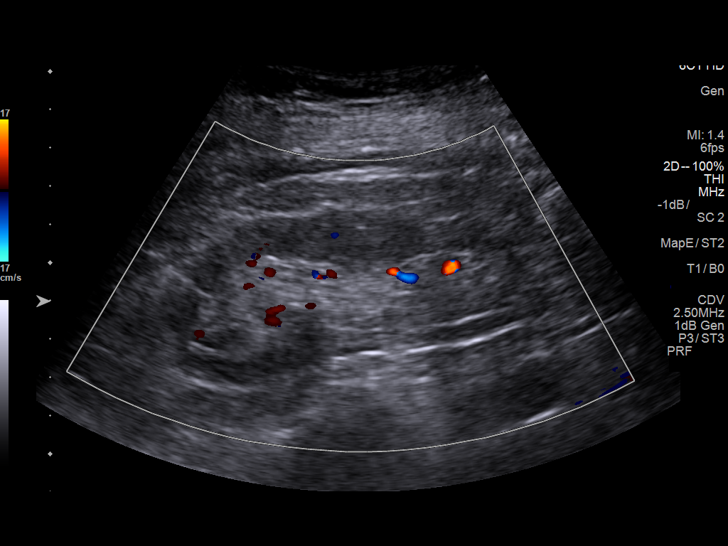
[im 84/84]
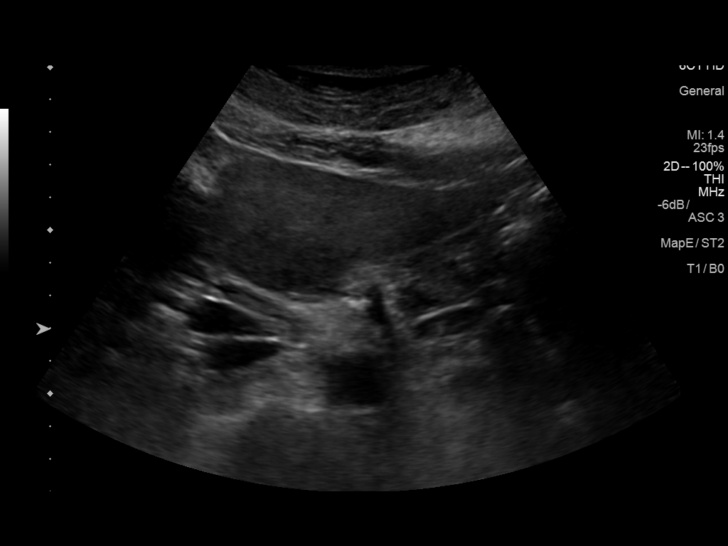

[13 of 25 positions shown; findings below may reference images not displayed]

FINDINGS: Gallbladder: No shadowing gallstones or echogenic sludge. No
gallbladder wall thickening or pericholecystic fluid. Negative
sonographic Murphy sign according to the ultrasound technologist.

Common bile duct: Diameter: Approximately 5 mm.

Liver: Diffusely increased and coarsened echotexture without focal
hepatic parenchymal abnormality. Portal vein is patent on color
Doppler imaging with normal direction of blood flow towards the
liver.

IVC: Patent.

Pancreas: While difficult to visualize in its entirety, visualized
portions normal in appearance.

Spleen: Normal size and echotexture without focal parenchymal
abnormality.

Right Kidney: Length: Approximately 11.8 cm. No hydronephrosis.
Well-preserved cortex. No shadowing calculi. Normal parenchymal
echotexture. No focal parenchymal abnormality.

Left Kidney: Length: Approximately 10.8 cm. No hydronephrosis.
Well-preserved cortex. No shadowing calculi. Normal parenchymal
echotexture. No focal parenchymal abnormality.

Abdominal aorta: Normal in caliber throughout its visualized course
in the abdomen with evidence of atherosclerosis. Maximum diameter
2.3 cm.

Other findings: None.
IMPRESSION: 1. Hepatic steatosis and/or hepatocellular disease without focal
hepatic parenchymal abnormality.
2.  Aortic Atherosclerosis (NVUXG-170.0)
3. Otherwise normal examination.

## 2019-03-30 ENCOUNTER — Other Ambulatory Visit: Payer: Self-pay | Admitting: Internal Medicine

## 2019-03-30 ENCOUNTER — Other Ambulatory Visit: Payer: Self-pay

## 2019-03-30 ENCOUNTER — Ambulatory Visit
Admission: RE | Admit: 2019-03-30 | Discharge: 2019-03-30 | Disposition: A | Discharge status: Home / Self Care | Payer: Medicare Other | Source: Ambulatory Visit | Attending: Internal Medicine | Admitting: Internal Medicine

## 2019-03-30 DIAGNOSIS — R634 Abnormal weight loss: Secondary | ICD-10-CM

## 2019-03-30 DIAGNOSIS — R319 Hematuria, unspecified: Secondary | ICD-10-CM

## 2019-05-04 ENCOUNTER — Telehealth: Payer: Self-pay | Admitting: *Deleted

## 2019-05-04 NOTE — Telephone Encounter (Signed)
Called and left the patient a message to call the office back. Patient needs a new patient appt scheduled

## 2019-05-05 ENCOUNTER — Telehealth: Payer: Self-pay | Admitting: *Deleted

## 2019-05-05 NOTE — Telephone Encounter (Signed)
Called and spoke with the patient, gave appt for 1/21.

## 2019-05-05 NOTE — Telephone Encounter (Signed)
Per Melissa APP, called and left the patient a message to call the office back. Need to move her appt from 1/21 to 1/22 at 1:15 pm

## 2019-05-07 NOTE — Progress Notes (Signed)
GYNECOLOGIC ONCOLOGY NEW PATIENT CONSULTATION   Patient Name: Cassandra Dodson  Patient Age: 79 y.o. Date of Service: 05/13/19 Referring Provider: Shon Baton, MD [address] 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  We reviewed the nature of endometrial cancer and its recommended surgical staging, including total hysterectomy, bilateral salpingo-oophorectomy, and lymph node assessment. The patient is a suitable candidate for staging via a minimally invasive approach to surgery.  We reviewed that robotic assistance would be used to complete the surgery.   We discussed that most endometrial cancer is detected early, however, we reviewed that the need for adjuvant therapy will be defered to final pathology results.    The patient underwent CT scan in December showing no evidence of metastatic disease.  Given her medical comorbidities, we discussed having her see her primary care provider from medical clearance for surgery.  She also has had a reaction to general anesthesia in the past, so we will likely place an anesthesia consult.  Our tentative plan is for surgery next Thursday, January 28.  We reviewed the sentinel lymph node technique. Risks and benefits of sentinel lymph node biopsy was reviewed. We reviewed the technique and ICG dye. The patient DOES NOT have an iodine allergy or known liver dysfunction. We reviewed the false negative rate (0.4%), and that 3% of patients with metastatic disease will not have it detected by SLN biopsy in endometrial cancer. A low risk of allergic reaction to the dye, <0.2% for ICG, has been reported. We also discussed that in the case of failed mapping, which occurs 40% of the time, a bilateral or unilateral lymphadenectomy will be performed at the surgeon's discretion.   Potential benefits of sentinel nodes  including a higher detection rate for metastasis due to ultrastaging and potential reduction in operative morbidity. However, there remains uncertainty as to the role for treatment of micrometastatic disease. Further, the benefit of operative morbidity associated with the SLN technique in endometrial cancer is not yet completely known. In other patient populations (e.g. the cervical cancer population) there has been observed reductions in morbidity with SLN biopsy compared to pelvic lymphadenectomy. Lymphedema, nerve dysfunction and lymphocysts are all potential risks with the SLN technique as with complete lymphadenectomy. Additional risks to the patient include the risk of damage to an internal organ while operating in an altered view (e.g. the black and white image of the robotic fluorescence imaging mode).   The patient was consented for a robotic assisted hysterectomy, bilateral salpingo-oophorectomy, sentinel lymph node evaluation, possible lymph node dissection, possible laparotomy. The risks of surgery were discussed in detail and she understands these to include infection; wound separation; hernia; vaginal cuff separation, injury to adjacent organs such as bowel, bladder, blood vessels, ureters and nerves; bleeding which may require blood transfusion; anesthesia risk; thromboembolic events; possible death; unforeseen complications; possible need for re-exploration; medical complications such as heart attack, stroke, pleural effusion and pneumonia; and, if full lymphadenectomy is performed the risk of lymphedema and lymphocyst. The patient will receive DVT and antibiotic prophylaxis as indicated. She voiced a clear understanding. She had the opportunity to ask questions. Perioperative instructions were reviewed with her. Prescriptions for post-op medications were NOT sent to her pharmacy of choice.  A copy of this note was sent to the patient's referring provider.   50 minutes of total time was spent  for this  patient encounter, including preparation, face-to-face counseling with the patient and coordination of care, and documentation of the encounter.  Jeral Pinch, MD  Division of Gynecologic Oncology  Department of Obstetrics and Gynecology  [facility]  ___________________________________________  Chief Complaint: Chief Complaint  Patient presents with  . Endometrial cancer [facility])    History of Present Illness:  Cassandra Dodson is a 79 y.o. y.o. female who is seen in consultation at the request of Shon Baton, MD for an evaluation of uterine cancer.  The patient endorses postmenopausal bleeding that began about 6 months ago.  She had mild spotting and reached out to her primary care provider.  They discussed referral to urologist but then her bleeding stopped.  It began again in December and she had an episode with significant bleeding where there was "blood all over the bathroom".  She reached out to Dr. Virgina Jock and ultimately was referred to a gynecologist.  Since the episode of heavier bleeding, she reports intermittent spotting.  She had some pain and cramping with her heavier bleeding but denies any pain now.  She underwent ultrasound and endometrial biopsy on 1/5.  Pelvic ultrasound was notable for a small uterus but 1.7 cm endometrial lining with increased blood flow in calcifications.  Left ovary with simple cyst measuring in greatest dimension 2.1 cm.  Right ovary normal in appearance.  No free fluid.  She endorses having decreased appetite over the last month and not feeling very hungry.  She lives part-time with her son and is more interested in eating the food that he cooks.  She has had some intermittent nausea in the past but denies any recently.  She denies any emesis.  She has a history of diarrhea previously, and denies any change to her bowel function.  She endorses some urinary incontinence that sounds like a combination of urgency and stress  incontinence.  Her medical history is complicated by diabetes, for which she takes Metformin as well as long-acting insulin.  She notes that her sugars at home are in the 150s at the highest.  Her last hemoglobin A1c was less than 7 per her report.  She also has hypertension, but does not check her blood pressure at home.  She walks with a cane due to balance issues.  She has had both knees replaced.  She denies any chest pain or shortness of breath with ambulation.  She lives at home and her son stays with her part-time as he works in [place].  PAST MEDICAL HISTORY:  Past Medical History:  Diagnosis Date  . Arthritis   . CKD (chronic kidney disease), stage III   . Depression   . Diabetes mellitus without complication ([facility])   . Endometrial cancer ([facility])   . GERD (gastroesophageal reflux disease)   . Headache   . Hypertension   . Hypothyroidism   . Neuromuscular disorder ([facility])    neuropathy in feet  . Osteoarthritis   . PMB (postmenopausal bleeding)   . PONV (postoperative nausea and vomiting)    severe nausea and vomiting after knee replacement  . Urinary frequency   . Wears glasses      PAST SURGICAL HISTORY:  Past Surgical History:  Procedure Laterality Date  . BREAST SURGERY     cyst removed  . CESAREAN SECTION    . FRACTURE SURGERY     right knee  . IRRIGATION AND DEBRIDEMENT SEBACEOUS CYST    . JOINT REPLACEMENT Right    right  .  teeth extration    . TONSILLECTOMY    . TOTAL HIP ARTHROPLASTY Right 02/23/2015   Procedure: RIGHT TOTAL HIP ARTHROPLASTY ANTERIOR APPROACH;  Surgeon: Leandrew Koyanagi, MD;  Location: [facility];  Service: Orthopedics;  Laterality: Right;  . TOTAL KNEE ARTHROPLASTY Left 06/27/2016   Procedure: LEFT TOTAL KNEE ARTHROPLASTY;  Surgeon: Leandrew Koyanagi, MD;  Location: [facility];  Service: Orthopedics;  Laterality: Left;    OB/GYN HISTORY:  OB History  Gravida Para Term Preterm AB Living  1 1          SAB TAB Ectopic Multiple Live Births               #  Outcome Date GA Lbr Len/2nd Weight Sex Delivery Anes PTL Lv  1 Para             No LMP recorded. Patient is postmenopausal.  Age at menarche: 78 Age at menopause: early 43s Hx of HRT: denies Hx of STDs: denies Last pap: 03/2019 History of abnormal pap smears: no  SCREENING STUDIES:  Last mammogram: unsure, not up to date  Last colonoscopy: >5 years ago  MEDICATIONS: Outpatient Encounter Medications as of 05/13/2019  Medication Sig  . Calcium Carb-Cholecalciferol (CALCIUM 600 + D PO) Take 1 tablet by mouth daily.  . fenofibrate 160 MG tablet Take 1 tablet by mouth daily.  . ferrous sulfate 325 (65 FE) MG tablet Take 325 mg by mouth daily with breakfast.  . HUMALOG MIX 75/25 KWIKPEN (75-25) 100 UNIT/ML Kwikpen Inject 18 Units into the skin 2 (two) times daily.   . metFORMIN (GLUCOPHAGE) 1000 MG tablet Take 1 tablet by mouth 2 (two) times daily.  . Multiple Vitamins-Minerals (MULTIVITAMIN WITH MINERALS) tablet Take 1 tablet by mouth daily.  Marland Kitchen senna-docusate (SENOKOT S) 8.6-50 MG tablet Take 1 tablet by mouth at bedtime as needed.  Marland Kitchen telmisartan-hydrochlorothiazide (MICARDIS HCT) 80-25 MG tablet Take 1 tablet by mouth daily.  . [DISCONTINUED] aspirin EC 81 MG tablet Take 81 mg by mouth daily.  . [DISCONTINUED] levothyroxine (SYNTHROID, LEVOTHROID) 50 MCG tablet Take 50 mcg by mouth every evening.  . [DISCONTINUED] methocarbamol (ROBAXIN) 750 MG tablet Take 1 tablet (750 mg total) by mouth 2 (two) times daily as needed for muscle spasms.  . [DISCONTINUED] ondansetron (ZOFRAN) 4 MG tablet Take 4 mg by mouth every 8 (eight) hours as needed for nausea or vomiting.  . [DISCONTINUED] oxyCODONE-acetaminophen (PERCOCET) 5-325 MG tablet Take 1 tablet by mouth 2 (two) times daily as needed for severe pain.  . [DISCONTINUED] polyethylene glycol (MIRALAX / GLYCOLAX) packet Take 17 g by mouth daily as needed for mild constipation.  . [DISCONTINUED] promethazine (PHENERGAN) 25 MG tablet Take 1 tablet  (25 mg total) by mouth every 6 (six) hours as needed for nausea.   No facility-administered encounter medications on file as of 05/13/2019.    ALLERGIES:  Allergies  Allergen Reactions  . Anesthetics, Amide Other (See Comments)    Pt is intolerant to general anesthesia. Pt will throw up and has thrown up during procedure.   . Ether     UNSPECIFIED REACTION      FAMILY HISTORY:  Family History  Problem Relation Age of Onset  . Pancreatic cancer Mother   . Stroke Father   . Hypertension Father      SOCIAL HISTORY:    Social Connections:   . Frequency of Communication with Friends and Family: Not on file  . Frequency of Social Gatherings with Friends and Family:  Not on file  . Attends Religious Services: Not on file  . Active Member of Clubs or Organizations: Not on file  . Attends Archivist Meetings: Not on file  . Marital Status: Not on file    REVIEW OF SYSTEMS:  Reports appetite change, weight loss due to appetite change, intermittent fatigue, hearing loss, ringing in ears, occasional shortness of breath, intermittent nausea, knee pain, chronic back pain, difficulty walking and with balance, intermittent headaches, bruising/bleeding easily, intermittent anxiety, occasional depression. Denies fevers, chills. Denies neck lumps or masses, mouth sores, or voice changes. Denies cough or wheezing.   Denies chest pain or palpitations. Denies leg swelling. Denies abdominal distention, pain, blood in stools, constipation, diarrhea, vomiting, or early satiety. Denies pain with intercourse, dysuria, frequency, hematuria or incontinence. Denies hot flashes, pelvic pain, or vaginal discharge.   Denies joint pain, back pain or muscle pain/cramps. Denies itching, rash, or wounds. Denies dizziness, headaches, numbness or seizures. Denies swollen lymph nodes or glands. Denies confusion, or decreased concentration.   Physical Exam:  Vital Signs for this encounter:  Blood  pressure (!) 165/114, pulse (!) 107, temperature 97.7 F (36.5 C), temperature source Temporal, resp. rate (!) 22, height 5\' 5"  (1.651 m), weight 197 lb (89.4 kg), SpO2 99 %. Body mass index is 32.78 kg/m. General: Alert, oriented, no acute distress.  HEENT: Normocephalic, atraumatic. Sclera anicteric.  Chest: Clear to auscultation bilaterally.  Cardiovascular: Mildly tachycardic, regular rhythm, no murmurs, rubs, or gallops.  Abdomen: Obese. Normoactive bowel sounds. Soft, nondistended, nontender to palpation. No masses or hepatosplenomegaly appreciated. No palpable fluid wave.  Well-healed vertical midline incision, infraumbilical. Extremities: Grossly normal range of motion. Warm, well perfused.  Trace edema bilaterally.  Skin: No rashes or lesions.  Lymphatics: No cervical, supraclavicular, or inguinal adenopathy.  GU:  Normal external female genitalia.  No lesions. No discharge or bleeding.             Bladder/urethra:  No lesions or masses, well supported bladder             Vagina: Mildly atrophic.             Cervix: Normal appearing, small.  Several calcified nodules on the cervix or just lateral to.  Benign in appearance.             Uterus: Small, mobile, no parametrial involvement or nodularity.             Adnexa: no masses appareciated.  Rectal: no nodularity.  LABORATORY AND RADIOLOGIC DATA:  Outside medical records were reviewed to synthesize the above history, along with the history and physical obtained during the visit.   Endometrial biopsy on 1/5: Endometrioid carcinoma, FIGO grade 2  CT A/P on 03/30/19: IMPRESSION: 1. No evidence for acute abnormality of the abdomen or pelvis. 2. Nonobstructing 6 millimeter RIGHT intrarenal calculus. 3. Normal CT appearance of the uterus. Consider pelvic ultrasound for further evaluation of postmenopausal bleeding. 4. Small hiatal hernia. 5. Coronary artery disease. 6. Colonic diverticulosis. 7. RIGHT hip arthroplasty. 8.  Degenerative changes in the lumbar spine. 9. Aortic Atherosclerosis (ICD10-I70.0).  Outside lab work from December 2020:  Glucose 223, BUN 16, creatinine 1.2 Sodium 134 Hemoglobin 12.5, hematocrit 40.2 Platelets 236

## 2019-05-13 ENCOUNTER — Encounter: Payer: Self-pay | Admitting: Gynecologic Oncology

## 2019-05-13 ENCOUNTER — Other Ambulatory Visit: Payer: Self-pay

## 2019-05-13 ENCOUNTER — Inpatient Hospital Stay: Payer: Medicare Other | Attending: Gynecologic Oncology | Admitting: Gynecologic Oncology

## 2019-05-13 VITALS — BP 165/114 | HR 107 | Temp 97.7°F | Resp 22 | Ht 65.0 in | Wt 197.0 lb

## 2019-05-13 DIAGNOSIS — I10 Essential (primary) hypertension: Secondary | ICD-10-CM

## 2019-05-13 DIAGNOSIS — C541 Malignant neoplasm of endometrium: Secondary | ICD-10-CM | POA: Diagnosis present

## 2019-05-13 DIAGNOSIS — E119 Type 2 diabetes mellitus without complications: Secondary | ICD-10-CM | POA: Insufficient documentation

## 2019-05-13 NOTE — Patient Instructions (Signed)
Preparing for your Surgery  Plan for surgery on May 20, 2019 with Dr. Jeral Pinch at [facility] will be scheduled for a robotic assisted total laparoscopic hysterectomy, bilateral salpingo-oophorectomy, sentinel lymph node biopsy.  We will need to obtain medical clearance for surgery from your primary care provider.   Pre-operative Testing -You will receive a phone call from presurgical testing at [facility] if you have not received a call already to arrange for a pre-operative testing appointment before your surgery.  This appointment normally occurs one to two weeks before your scheduled surgery.   -Bring your insurance card, copy of an advanced directive if applicable, medication list  -At that visit, you will be asked to sign a consent for a possible blood transfusion in case a transfusion becomes necessary during surgery.  The need for a blood transfusion is rare but having consent is a necessary part of your care.     -You should not be taking blood thinners or aspirin at least ten days prior to surgery unless instructed by your surgeon.  -Do not take supplements such as fish oil (omega 3), red yeast rice, tumeric before your surgery.   Day Before Surgery at [facility] will be asked to take in a light diet the day before surgery.  Avoid carbonated beverages.  You will be advised to have nothing to eat or drink after midnight the evening before.    Eat a light diet the day before surgery.  Examples including soups, broths, toast, yogurt, mashed potatoes.  Things to avoid include carbonated beverages (fizzy beverages), raw fruits and raw vegetables, or beans.   If your bowels are filled with gas, your surgeon will have difficulty visualizing your pelvic organs which increases your surgical risks.  Your role in recovery Your role is to become active as soon as directed by your doctor, while still giving yourself time to heal.  Rest when you feel tired. You  will be asked to do the following in order to speed your recovery:  - Cough and breathe deeply. This helps to clear and expand your lungs and can prevent pneumonia after surgery.  - [place]. Do mild physical activity. Walking or moving your legs help your circulation and body functions return to normal. Do not try to get up or walk alone the first time after surgery.   -If you develop swelling on one leg or the other, pain in the back of your leg, redness/warmth in one of your legs, please call the office or go to the Emergency Room to have a doppler to rule out a blood clot. For shortness of breath, chest pain-seek care in the Emergency Room as soon as possible. - Actively manage your pain. Managing your pain lets you move in comfort. We will ask you to rate your pain on a scale of zero to 10. It is your responsibility to tell your doctor or nurse where and how much you hurt so your pain can be treated.  Special Considerations -If you are diabetic, you may be placed on insulin after surgery to have closer control over your blood sugars to promote healing and recovery.  This does not mean that you will be discharged on insulin.  If applicable, your oral antidiabetics will be resumed when you are tolerating a solid diet.  -Your final pathology results from surgery should be available around one week after surgery and the results will be relayed to you when available.  -  Dr. Lahoma Crocker is the surgeon that assists your GYN Oncologist with surgery.  If you end up staying the night, the next day after your surgery you will either see Dr. Denman George, Dr. Berline Lopes, or Dr. Lahoma Crocker.  -FMLA forms can be faxed to [phone] and please allow 5-7 business days for completion.  Blood Transfusion Information (For the consent to be signed before surgery)  We will be checking your blood type before surgery so in case of emergencies, we will know what type of blood you would  need.                                            WHAT IS A BLOOD TRANSFUSION?  A transfusion is the replacement of blood or some of its parts. Blood is made up of multiple cells which provide different functions.  Red blood cells carry oxygen and are used for blood loss replacement.  White blood cells fight against infection.  Platelets control bleeding.  Plasma helps clot blood.  Other blood products are available for specialized needs, such as hemophilia or other clotting disorders. BEFORE THE TRANSFUSION  Who gives blood for transfusions?   You may be able to donate blood to be used at a later date on yourself (autologous donation).  Relatives can be asked to donate blood. This is generally not any safer than if you have received blood from a stranger. The same precautions are taken to ensure safety when a relative's blood is donated.  Healthy volunteers who are fully evaluated to make sure their blood is safe. This is blood bank blood. Transfusion therapy is the safest it has ever been in the practice of medicine. Before blood is taken from a donor, a complete history is taken to make sure that person has no history of diseases nor engages in risky social behavior (examples are intravenous drug use or sexual activity with multiple partners). The donor's travel history is screened to minimize risk of transmitting infections, such as malaria. The donated blood is tested for signs of infectious diseases, such as HIV and hepatitis. The blood is then tested to be sure it is compatible with you in order to minimize the chance of a transfusion reaction. If you or a relative donates blood, this is often done in anticipation of surgery and is not appropriate for emergency situations. It takes many days to process the donated blood. RISKS AND COMPLICATIONS Although transfusion therapy is very safe and saves many lives, the main dangers of transfusion include:   Getting an infectious  disease.  Developing a transfusion reaction. This is an allergic reaction to something in the blood you were given. Every precaution is taken to prevent this. The decision to have a blood transfusion has been considered carefully by your caregiver before blood is given. Blood is not given unless the benefits outweigh the risks.  AFTER SURGERY INSTRUCTIONS  Return to work: 4-6 weeks if applicable  Activity: 1. Be up and out of the bed during the day.  Take a nap if needed.  You may walk up steps but be careful and use the hand rail.  Stair climbing will tire you more than you think, you may need to stop part way and rest.   2. No lifting or straining for 6 weeks over 10 pounds. No pushing, pulling, straining for 6 weeks.  3. No driving for  1 week(s) if you cleared to drive before surgery.  Do not drive if you are taking narcotic pain medicine.   4. You can shower as soon as the next day after surgery. Shower daily.  Use soap and water on your incision and pat dry; don't rub.  No tub baths or submerging your body in water until cleared by your surgeon. If you have the soap that was given to you by pre-surgical testing that was used before surgery, you do not need to use it afterwards because this can irritate your incisions.   5. No sexual activity and nothing in the vagina for 8 weeks.  6. You may experience a small amount of clear drainage from your incisions, which is normal.  If the drainage persists, increases, or changes color please call the office.  7. Do not use creams, lotions, or ointments such as neosporin on your incisions after surgery until advised by your surgeon because they can cause removal of the dermabond glue on your incisions.    8. You may experience vaginal spotting after surgery or around the 6-8 week mark from surgery when the stitches at the top of the vagina begin to dissolve.  The spotting is normal but if you experience heavy bleeding, call our office.  9. Take  Tylenol or ibuprofen first for pain and only use narcotic pain medication for severe pain not relieved by the Tylenol or Ibuprofen.  Monitor your Tylenol intake to a max of 4,000 mg.  Diet: 1. Low sodium Heart Healthy Diet is recommended.  2. It is safe to use a laxative, such as Miralax or Colace, if you have difficulty moving your bowels. You can take Sennakot at bedtime every evening to keep bowel movements regular and to prevent constipation.    Wound Care: 1. Keep clean and dry.  Shower daily.  Reasons to call the Doctor:  Fever - Oral temperature greater than 100.4 degrees Fahrenheit  Foul-smelling vaginal discharge  Difficulty urinating  Nausea and vomiting  Increased pain at the site of the incision that is unrelieved with pain medicine.  Difficulty breathing with or without chest pain  New calf pain especially if only on one side  Sudden, continuing increased vaginal bleeding with or without clots.   Contacts: For questions or concerns you should contact:  Dr. Jeral Pinch at [phone]  Joylene John, NP at [phone]  After Hours: call [phone] and have the GYN Oncologist paged/contacted

## 2019-05-14 ENCOUNTER — Telehealth: Payer: Self-pay

## 2019-05-14 ENCOUNTER — Other Ambulatory Visit: Payer: Self-pay | Admitting: Gynecologic Oncology

## 2019-05-14 DIAGNOSIS — C541 Malignant neoplasm of endometrium: Secondary | ICD-10-CM

## 2019-05-14 DIAGNOSIS — E1122 Type 2 diabetes mellitus with diabetic chronic kidney disease: Secondary | ICD-10-CM

## 2019-05-14 DIAGNOSIS — Z794 Long term (current) use of insulin: Secondary | ICD-10-CM

## 2019-05-14 NOTE — Telephone Encounter (Signed)
LM in [facility]' vm that [place] surgery can be delayed 1 month for optimization. If more time is needed, an IUD can be placed to treat the cancer while working on other medical issues per Joylene John, NP.

## 2019-05-14 NOTE — Progress Notes (Signed)
Last A1C was 9.3 in Nov 2020

## 2019-05-17 ENCOUNTER — Other Ambulatory Visit (HOSPITAL_COMMUNITY)
Admission: RE | Admit: 2019-05-17 | Discharge: 2019-05-17 | Disposition: A | Discharge status: Home / Self Care | Payer: Medicare Other | Source: Ambulatory Visit | Attending: Gynecologic Oncology | Admitting: Gynecologic Oncology

## 2019-05-17 ENCOUNTER — Inpatient Hospital Stay: Payer: Medicare Other

## 2019-05-17 ENCOUNTER — Other Ambulatory Visit: Payer: Self-pay

## 2019-05-17 DIAGNOSIS — Z01812 Encounter for preprocedural laboratory examination: Secondary | ICD-10-CM | POA: Diagnosis present

## 2019-05-17 DIAGNOSIS — C541 Malignant neoplasm of endometrium: Secondary | ICD-10-CM | POA: Diagnosis not present

## 2019-05-17 DIAGNOSIS — N183 Chronic kidney disease, stage 3 unspecified: Secondary | ICD-10-CM

## 2019-05-17 DIAGNOSIS — Z20822 Contact with and (suspected) exposure to covid-19: Secondary | ICD-10-CM | POA: Diagnosis not present

## 2019-05-17 DIAGNOSIS — Z794 Long term (current) use of insulin: Secondary | ICD-10-CM

## 2019-05-17 LAB — SARS CORONAVIRUS 2 (TAT 6-24 HRS): SARS Coronavirus 2: NEGATIVE

## 2019-05-17 LAB — HEMOGLOBIN A1C
Hgb A1c MFr Bld: 9.5 % — ABNORMAL HIGH (ref 4.8–5.6)
Mean Plasma Glucose: 225.95 mg/dL

## 2019-05-17 NOTE — Telephone Encounter (Signed)
Told Ms Cassandra Dodson that her Hg A1c was 9.5 today.  This is too high for her surgery to be done 05-20-19. Surgery

## 2019-05-18 ENCOUNTER — Encounter (HOSPITAL_COMMUNITY): Payer: Medicare Other

## 2019-05-18 ENCOUNTER — Encounter (HOSPITAL_COMMUNITY): Admission: RE | Admit: 2019-05-18 | Payer: Medicare Other | Source: Ambulatory Visit

## 2019-05-19 NOTE — Telephone Encounter (Signed)
Melissa Cross,NP discussed Blood sugar Control with Dr. Keane Police office.

## 2019-05-20 ENCOUNTER — Telehealth: Payer: Self-pay

## 2019-05-20 ENCOUNTER — Ambulatory Visit (HOSPITAL_COMMUNITY): Admission: RE | Admit: 2019-05-20 | Payer: Medicare Other | Source: Home / Self Care | Admitting: Gynecologic Oncology

## 2019-05-20 ENCOUNTER — Encounter (HOSPITAL_COMMUNITY): Admission: RE | Payer: Self-pay | Source: Home / Self Care

## 2019-05-20 SURGERY — HYSTERECTOMY, TOTAL, ROBOT-ASSISTED, LAPAROSCOPIC, WITH BILATERAL SALPINGO-OOPHORECTOMY
Anesthesia: General

## 2019-05-20 NOTE — Telephone Encounter (Signed)
Spoke with Cassandra Dodson and gave her the new surgery date as noted below. Pt verbalized understanding.

## 2019-05-20 NOTE — Telephone Encounter (Signed)
Let the patient know that we are going to reschedule her surgery for Feb 23 in order to give her time to have her blood sugar and BP improve? The same instructions will apply that were discussed at last visit. New date of Feb 23 at the [facility] per Joylene John, NP.

## 2019-05-26 ENCOUNTER — Ambulatory Visit: Payer: Medicare Other | Admitting: Gynecologic Oncology

## 2019-06-07 ENCOUNTER — Other Ambulatory Visit: Payer: Self-pay | Admitting: Gynecologic Oncology

## 2019-06-07 DIAGNOSIS — C541 Malignant neoplasm of endometrium: Secondary | ICD-10-CM

## 2019-06-07 NOTE — Patient Instructions (Addendum)
DUE TO COVID-19 ONLY ONE VISITOR IS ALLOWED TO COME WITH YOU AND STAY IN THE WAITING ROOM ONLY DURING PRE OP AND PROCEDURE DAY OF SURGERY. THE 1 VISITOR MAY VISIT WITH YOU AFTER SURGERY IN YOUR PRIVATE ROOM DURING VISITING HOURS ONLY!  YOU NEED TO HAVE A COVID 19 TEST ON__Friday 02/19/2021_____ @__1 :30 pm_____, THIS TEST MUST BE DONE BEFORE SURGERY, COME  [address] [place] , [id number].  ([place]) ONCE YOUR COVID TEST IS COMPLETED, PLEASE BEGIN THE QUARANTINE INSTRUCTIONS AS OUTLINED IN YOUR HANDOUT.                Cassandra Dodson     Your procedure is scheduled on: Tuesday  06/15/2019   Report to [facility] Main  Entrance    Report to Short Stay at    0530  AM   How to Manage Your Diabetes Before and After Surgery  Why is it important to control my blood sugar before and after surgery? . Improving blood sugar levels before and after surgery helps healing and can limit problems. . A way of improving blood sugar control is eating a healthy diet by: o  Eating less sugar and carbohydrates o  Increasing activity/exercise o  Talking with your doctor about reaching your blood sugar goals . High blood sugars (greater than 180 mg/dL) can raise your risk of infections and slow your recovery, so you will need to focus on controlling your diabetes during the weeks before surgery. . Make sure that the doctor who takes care of your diabetes knows about your planned surgery including the date and location.  How do I manage my blood sugar before surgery? . Check your blood sugar at least 4 times a day, starting 2 days before surgery, to make sure that the level is not too high or low. o Check your blood sugar the morning of your surgery when you wake up and every 2 hours until you get to the Short Stay unit. . If your blood sugar is less than 70 mg/dL, you will need to treat for low blood sugar: o Do not take insulin. o Treat a low blood sugar (less than 70 mg/dL)  with  cup of clear juice (cranberry or apple), 4 glucose tablets, OR glucose gel. o Recheck blood sugar in 15 minutes after treatment (to make sure it is greater than 70 mg/dL). If your blood sugar is not greater than 70 mg/dL on recheck, call [phone] for further instructions. . Report your blood sugar to the short stay nurse when you get to Short Stay.  . If you are admitted to the hospital after surgery: o Your blood sugar will be checked by the staff and you will probably be given insulin after surgery (instead of oral diabetes medicines) to make sure you have good blood sugar levels. o The goal for blood sugar control after surgery is 80-180 mg/dL.   WHAT DO I DO ABOUT MY DIABETES MEDICATION?        The day before surgery, Take Metformin (Glucophage ) as prescribed!  . Do not take oral diabetes medicines (pills) the morning of surgery.  . THE NIGHT BEFORE SURGERY, take  12   units of  Humalog 75/25     insulin.       . THE MORNING OF SURGERY, take 0  units of    Humalog mix 75/25     insulin.   I  Call this number if you have problems  the morning of surgery [phone]       Eat a light diet the day before surgery.  Examples including soups, broths, toast, yogurt, mashed potatoes.  Things to avoid include carbonated beverages (fizzy beverages), raw fruits and raw vegetables, or beans.    If your bowels are filled with gas, your surgeon will have difficulty visualizing your pelvic organs which increases your surgical risks.   CLEAR LIQUID DIET   Foods Allowed                                                                     Foods Excluded  Coffee and tea, regular and decaf                             liquids that you cannot  Plain Jell-O any favor except red or purple                                           see through such as: Fruit ices (not with fruit pulp)                                     milk, soups, orange juice  Iced Popsicles                                     All solid food                                   Cranberry, grape and apple juices Sports drinks like Gatorade Lightly seasoned clear broth or consume(fat free) Sugar, honey syrup  Sample Menu Breakfast                                Lunch                                     Supper Cranberry juice                    Beef broth                            Chicken broth Jell-O                                     Grape juice                           Apple juice Coffee or tea  Jell-O                                      Popsicle                                                Coffee or tea                        Coffee or tea  _____________________________________________________________________               Dennis Bast may have clear liquids From midnight up until 0430 am then nothing until after surgery!    BRUSH YOUR TEETH MORNING OF SURGERY AND RINSE YOUR MOUTH OUT, NO CHEWING GUM CANDY OR MINTS.     Take these medicines the morning of surgery with A SIP OF WATER: fenofibrate   DO NOT TAKE ANY DIABETIC MEDICATIONS DAY OF YOUR SURGERY!                               You may not have any metal on your body including hair pins and              piercings  Do not wear jewelry, make-up, lotions, powders or perfumes, deodorant             Do not wear nail polish on your fingernails.  Do not shave  48 hours prior to surgery.     Do not bring valuables to the hospital. [place].  Contacts, dentures or bridgework may not be worn into surgery.  Leave suitcase in the car. After surgery it may be brought to your room.     Patients discharged the day of surgery will not be allowed to drive home. IF YOU ARE HAVING SURGERY AND GOING HOME THE SAME DAY, YOU MUST HAVE AN ADULT TO DRIVE YOU HOME AND  BE WITH YOU FOR 24 HOURS. YOU MAY GO HOME BY TAXI OR UBER OR ORTHERWISE, BUT AN ADULT MUST ACCOMPANY YOU HOME AND STAY WITH YOU FOR  24 HOURS.  Name and phone number of your driver:son- [name]  [phone]                Please read over the following fact sheets you were given: _____________________________________________________________________             [facility] - Preparing for Surgery Before surgery, you can play an important role.  Because skin is not sterile, your skin needs to be as free of germs as possible.  You can reduce the number of germs on your skin by washing with CHG (chlorahexidine gluconate) soap before surgery.  CHG is an antiseptic cleaner which kills germs and bonds with the skin to continue killing germs even after washing. Please DO NOT use if you have an allergy to CHG or antibacterial soaps.  If your skin becomes reddened/irritated stop using the CHG and inform your nurse when you arrive at Short Stay. Do not shave (including legs and underarms) for at least 48 hours prior to the first CHG shower.  You may shave your  face/neck. Please follow these instructions carefully:  1.  Shower with CHG Soap the night before surgery and the  morning of Surgery.  2.  If you choose to wash your hair, wash your hair first as usual with your  normal  shampoo.  3.  After you shampoo, rinse your hair and body thoroughly to remove the  shampoo.                           4.  Use CHG as you would any other liquid soap.  You can apply chg directly  to the skin and wash                       Gently with a scrungie or clean washcloth.  5.  Apply the CHG Soap to your body ONLY FROM THE NECK DOWN.   Do not use on face/ open                           Wound or open sores. Avoid contact with eyes, ears mouth and genitals (private parts).                       Wash face,  Genitals (private parts) with your normal soap.             6.  Wash thoroughly, paying special attention to the area where your surgery  will be performed.  7.  Thoroughly rinse your body with warm water from the neck down.  8.  DO NOT shower/wash  with your normal soap after using and rinsing off  the CHG Soap.                9.  Pat yourself dry with a clean towel.            10.  Wear clean pajamas.            11.  Place clean sheets on your bed the night of your first shower and do not  sleep with pets. Day of Surgery : Do not apply any lotions/deodorants the morning of surgery.  Please wear clean clothes to the hospital/surgery center.  FAILURE TO FOLLOW THESE INSTRUCTIONS MAY RESULT IN THE CANCELLATION OF YOUR SURGERY PATIENT SIGNATURE_________________________________  NURSE SIGNATURE__________________________________  ________________________________________________________________________  WHAT IS A BLOOD TRANSFUSION? Blood Transfusion Information  A transfusion is the replacement of blood or some of its parts. Blood is made up of multiple cells which provide different functions.  Red blood cells carry oxygen and are used for blood loss replacement.  White blood cells fight against infection.  Platelets control bleeding.  Plasma helps clot blood.  Other blood products are available for specialized needs, such as hemophilia or other clotting disorders. BEFORE THE TRANSFUSION  Who gives blood for transfusions?   Healthy volunteers who are fully evaluated to make sure their blood is safe. This is blood bank blood. Transfusion therapy is the safest it has ever been in the practice of medicine. Before blood is taken from a donor, a complete history is taken to make sure that person has no history of diseases nor engages in risky social behavior (examples are intravenous drug use or sexual activity with multiple partners). The donor's travel history is screened to minimize risk of transmitting infections, such as malaria. The donated blood is tested for signs of infectious diseases, such as  HIV and hepatitis. The blood is then tested to be sure it is compatible with you in order to minimize the chance of a transfusion reaction.  If you or a relative donates blood, this is often done in anticipation of surgery and is not appropriate for emergency situations. It takes many days to process the donated blood. RISKS AND COMPLICATIONS Although transfusion therapy is very safe and saves many lives, the main dangers of transfusion include:   Getting an infectious disease.  Developing a transfusion reaction. This is an allergic reaction to something in the blood you were given. Every precaution is taken to prevent this. The decision to have a blood transfusion has been considered carefully by your caregiver before blood is given. Blood is not given unless the benefits outweigh the risks. AFTER THE TRANSFUSION  Right after receiving a blood transfusion, you will usually feel much better and more energetic. This is especially true if your red blood cells have gotten low (anemic). The transfusion raises the level of the red blood cells which carry oxygen, and this usually causes an energy increase.  The nurse administering the transfusion will monitor you carefully for complications. HOME CARE INSTRUCTIONS  No special instructions are needed after a transfusion. You may find your energy is better. Speak with your caregiver about any limitations on activity for underlying diseases you may have. SEEK MEDICAL CARE IF:   Your condition is not improving after your transfusion.  You develop redness or irritation at the intravenous (IV) site. SEEK IMMEDIATE MEDICAL CARE IF:  Any of the following symptoms occur over the next 12 hours:  Shaking chills.  You have a temperature by mouth above 102 F (38.9 C), not controlled by medicine.  Chest, back, or muscle pain.  People around you feel you are not acting correctly or are confused.  Shortness of breath or difficulty breathing.  Dizziness and fainting.  You get a rash or develop hives.  You have a decrease in urine output.  Your urine turns a dark color or changes to  pink, red, or [place]. Any of the following symptoms occur over the next 10 days:  You have a temperature by mouth above 102 F (38.9 C), not controlled by medicine.  Shortness of breath.  Weakness after normal activity.  The white part of the eye turns yellow (jaundice).  You have a decrease in the amount of urine or are urinating less often.  Your urine turns a dark color or changes to pink, red, or [place]. Document Released: 04/05/2000 Document Revised: 07/01/2011 Document Reviewed: 11/23/2007 [facility] Patient Information 2014 [place], [place].  _______________________________________________________________________

## 2019-06-08 ENCOUNTER — Encounter (HOSPITAL_COMMUNITY): Payer: Self-pay

## 2019-06-08 ENCOUNTER — Other Ambulatory Visit: Payer: Self-pay

## 2019-06-08 ENCOUNTER — Encounter (HOSPITAL_COMMUNITY)
Admission: RE | Admit: 2019-06-08 | Discharge: 2019-06-08 | Disposition: A | Discharge status: Home / Self Care | Payer: Medicare Other | Source: Ambulatory Visit | Attending: Gynecologic Oncology | Admitting: Gynecologic Oncology

## 2019-06-08 DIAGNOSIS — Z01812 Encounter for preprocedural laboratory examination: Secondary | ICD-10-CM | POA: Diagnosis present

## 2019-06-08 NOTE — Progress Notes (Signed)
PCP - Dr. Shon Baton Cardiologist - n/a  Chest x-ray - 06/11/2019  epic EKG - 06/11/2019  epic Stress Test - n/a ECHO - n/a Cardiac Cath - n/a  Sleep Study - n/a CPAP - n/a  Fasting Blood Sugar - does not check blood glucose Checks Blood Sugar ___0__ times a day  Blood Thinner Instructions:n/a Aspirin Instructions:n/a Last Dose:n/a  Anesthesia review:  Chart to be reviewed by Konrad Felix, PA.  Patient has a history of severe nausea and vomiting from anesthesia (2016 surgery), CKD stage 3 (patient denies this), DM type 2, HTN and endometrial cancer.  Patient denies shortness of breath, fever, cough and chest pain at PAT appointment   Patient verbalized understanding of instructions that were given to them at the PAT appointment. Patient was also instructed that they will need to review over the PAT instructions again at home before surgery.

## 2019-06-10 ENCOUNTER — Ambulatory Visit: Payer: Medicare Other | Admitting: Gynecologic Oncology

## 2019-06-11 ENCOUNTER — Ambulatory Visit (HOSPITAL_COMMUNITY)
Admission: RE | Admit: 2019-06-11 | Discharge: 2019-06-11 | Disposition: A | Discharge status: Home / Self Care | Payer: Medicare Other | Source: Ambulatory Visit | Attending: Gynecologic Oncology | Admitting: Gynecologic Oncology

## 2019-06-11 ENCOUNTER — Other Ambulatory Visit: Payer: Self-pay

## 2019-06-11 ENCOUNTER — Other Ambulatory Visit (HOSPITAL_COMMUNITY)
Admission: RE | Admit: 2019-06-11 | Discharge: 2019-06-11 | Disposition: A | Discharge status: Home / Self Care | Payer: Medicare Other | Source: Ambulatory Visit | Attending: Gynecologic Oncology | Admitting: Gynecologic Oncology

## 2019-06-11 ENCOUNTER — Other Ambulatory Visit: Payer: Self-pay | Admitting: Gynecologic Oncology

## 2019-06-11 ENCOUNTER — Telehealth: Payer: Self-pay | Admitting: Gynecologic Oncology

## 2019-06-11 ENCOUNTER — Encounter (HOSPITAL_COMMUNITY): Payer: Self-pay | Admitting: Physician Assistant

## 2019-06-11 ENCOUNTER — Encounter (HOSPITAL_COMMUNITY)
Admission: RE | Admit: 2019-06-11 | Discharge: 2019-06-11 | Disposition: A | Discharge status: Home / Self Care | Payer: Medicare Other | Source: Ambulatory Visit | Attending: Gynecologic Oncology | Admitting: Gynecologic Oncology

## 2019-06-11 DIAGNOSIS — E119 Type 2 diabetes mellitus without complications: Secondary | ICD-10-CM | POA: Insufficient documentation

## 2019-06-11 DIAGNOSIS — I493 Ventricular premature depolarization: Secondary | ICD-10-CM | POA: Diagnosis not present

## 2019-06-11 DIAGNOSIS — Z20822 Contact with and (suspected) exposure to covid-19: Secondary | ICD-10-CM | POA: Diagnosis not present

## 2019-06-11 DIAGNOSIS — Z01810 Encounter for preprocedural cardiovascular examination: Secondary | ICD-10-CM | POA: Insufficient documentation

## 2019-06-11 DIAGNOSIS — C541 Malignant neoplasm of endometrium: Secondary | ICD-10-CM

## 2019-06-11 DIAGNOSIS — Z01812 Encounter for preprocedural laboratory examination: Secondary | ICD-10-CM | POA: Diagnosis not present

## 2019-06-11 DIAGNOSIS — Z01818 Encounter for other preprocedural examination: Secondary | ICD-10-CM | POA: Diagnosis not present

## 2019-06-11 DIAGNOSIS — I10 Essential (primary) hypertension: Secondary | ICD-10-CM | POA: Insufficient documentation

## 2019-06-11 DIAGNOSIS — R000 Tachycardia, unspecified: Secondary | ICD-10-CM | POA: Insufficient documentation

## 2019-06-11 LAB — URINALYSIS, ROUTINE W REFLEX MICROSCOPIC
Bilirubin Urine: NEGATIVE
Glucose, UA: 50 mg/dL — AB
Hgb urine dipstick: NEGATIVE
Ketones, ur: NEGATIVE mg/dL
Nitrite: NEGATIVE
Protein, ur: 30 mg/dL — AB
Specific Gravity, Urine: 1.015 (ref 1.005–1.030)
WBC, UA: >50 WBC/hpf — ABNORMAL HIGH (ref 0–5)
pH: 5 (ref 5.0–8.0)

## 2019-06-11 LAB — CBC
HCT: 41.4 % (ref 36.0–46.0)
Hemoglobin: 12.4 g/dL (ref 12.0–15.0)
MCH: 25.2 pg — ABNORMAL LOW (ref 26.0–34.0)
MCHC: 30 g/dL (ref 30.0–36.0)
MCV: 84 fL (ref 80.0–100.0)
Platelets: 228 10*3/uL (ref 150–400)
RBC: 4.93 MIL/uL (ref 3.87–5.11)
RDW: 16.6 % — ABNORMAL HIGH (ref 11.5–15.5)
WBC: 9.5 10*3/uL (ref 4.0–10.5)
nRBC: 0 % (ref 0.0–0.2)

## 2019-06-11 LAB — COMPREHENSIVE METABOLIC PANEL
ALT: 13 U/L (ref 0–44)
AST: 16 U/L (ref 15–41)
Albumin: 3.9 g/dL (ref 3.5–5.0)
Alkaline Phosphatase: 74 U/L (ref 38–126)
Anion gap: 12 (ref 5–15)
BUN: 22 mg/dL (ref 8–23)
CO2: 23 mmol/L (ref 22–32)
Calcium: 9.3 mg/dL (ref 8.9–10.3)
Chloride: 103 mmol/L (ref 98–111)
Creatinine, Ser: 1.43 mg/dL — ABNORMAL HIGH (ref 0.44–1.00)
GFR calc Af Amer: 40 mL/min — ABNORMAL LOW (ref >60)
GFR calc non Af Amer: 35 mL/min — ABNORMAL LOW (ref >60)
Glucose, Bld: 336 mg/dL — ABNORMAL HIGH (ref 70–99)
Potassium: 4.2 mmol/L (ref 3.5–5.1)
Sodium: 138 mmol/L (ref 135–145)
Total Bilirubin: 0.3 mg/dL (ref 0.3–1.2)
Total Protein: 7.2 g/dL (ref 6.5–8.1)

## 2019-06-11 LAB — SARS CORONAVIRUS 2 (TAT 6-24 HRS): SARS Coronavirus 2: NEGATIVE

## 2019-06-11 LAB — GLUCOSE, CAPILLARY: Glucose-Capillary: 283 mg/dL — ABNORMAL HIGH (ref 70–99)

## 2019-06-11 MED ORDER — LEVONORGESTREL 20 MCG/24HR IU IUD: 1.0000 | INTRAUTERINE_SYSTEM | Freq: Once | INTRAUTERINE | 0 refills | Status: DC

## 2019-06-11 NOTE — Telephone Encounter (Signed)
Called the patient to review preoperative labs.  Her glucose on fingerstick was 283 and on her metabolic panel was 0000000.  I expressed my concern regarding perioperative cardiac events as well as surgical complications (such as wound infection) given her elevated glucose.  The patient was very understanding of this.  My recommendation is that we move forward next Tuesday with a D&C and Mirena IUD placement.  If we are unable to get the IUD by Tuesday, then I will plan to have her come in to clinic later next week to place the IUD.  We will contact her primary care physician with new plan to spend the next 3 months working on tighter glycemic control.  If we are able to improve her average glucose and get it under 200, then I would like to move forward with surgery.  All the patient's questions were answered.  Jeral Pinch MD Gynecologic Oncology

## 2019-06-12 LAB — ABO/RH: ABO/RH(D): O POS

## 2019-06-13 LAB — URINE CULTURE: Culture: >=100000 — AB

## 2019-06-14 ENCOUNTER — Telehealth: Payer: Self-pay | Admitting: Oncology

## 2019-06-14 ENCOUNTER — Telehealth: Payer: Self-pay | Admitting: *Deleted

## 2019-06-14 NOTE — Telephone Encounter (Signed)
Called Dr. Linus Salmons with Infectious Disease regarding urine culture treatment recommendations for klebsiella pneumoniae (ESBL) with poorly controled diabetes.  He said if she has an asymptomatic ESBL, she does not need treatment.

## 2019-06-14 NOTE — Progress Notes (Deleted)
Gross - she's not symptomatic. But feels a little weird not treating. How hard is it to get home IV antibiotics for a patient? I'm a little afraid that if untreated, she'll seed and become bacteremic (especially with her poorly controlled diabetes).Marland KitchenMarland Kitchen

## 2019-06-14 NOTE — Telephone Encounter (Signed)
Message left W/ Dr. Keane Police RN regarding Ms. Barse's surgery on hold due to her blood sugar. Ms. Salvia will be scheduled to have an IUD placed and surgery can be scheduled when her blood sugars are more under control.

## 2019-06-14 NOTE — Telephone Encounter (Addendum)
Called Cassandra Dodson regarding Mirena IUD co pay cost of $395.00.  She said that should be ok.  She is going to check with her son because he manages her finances.  Cassandra Dodson called back after talking with her son and she is not able to pay for the IUD until Friday.  Advised her that her procedure for tomorrow is canceled and that we will call back with new appointment information.

## 2019-06-15 ENCOUNTER — Ambulatory Visit (HOSPITAL_COMMUNITY)
Admission: RE | Admit: 2019-06-15 | Payer: Medicare Other | Source: Other Acute Inpatient Hospital | Admitting: Gynecologic Oncology

## 2019-06-15 LAB — TYPE AND SCREEN
ABO/RH(D): O POS
Antibody Screen: NEGATIVE

## 2019-06-15 SURGERY — HYSTERECTOMY, TOTAL, ROBOT-ASSISTED, LAPAROSCOPIC, WITH BILATERAL SALPINGO-OOPHORECTOMY
Anesthesia: General

## 2019-06-15 SURGERY — Surgical Case
Anesthesia: *Unknown

## 2019-06-16 ENCOUNTER — Telehealth: Payer: Self-pay | Admitting: Oncology

## 2019-06-16 ENCOUNTER — Encounter: Payer: Self-pay | Admitting: Gynecologic Oncology

## 2019-06-16 MED FILL — MIRENA SYSTEM: 20 | 90 days supply | Qty: 1 | Fill #0

## 2019-06-16 NOTE — Progress Notes (Signed)
Received referral from [place] regarding financial assistance for IUD for treatment.  Called patient to inquire about income qualifications for one-time $1000 [employer]. Patient did not answer. Left voicemail with my contact name and number to return my call.

## 2019-06-16 NOTE — Progress Notes (Signed)
Patient returned my call. Based on verbal income, patient exceeds the income guidelines for the [employer].  Sent in-basket message to [employer] and Ander Purpura SW to advise.   Advised they may want to reach out to pharmacy for possible assistance.

## 2019-06-17 ENCOUNTER — Telehealth: Payer: Self-pay

## 2019-06-17 NOTE — Telephone Encounter (Signed)
Gave Ms Counce the [place] phone number [phone] to call on Friday 06-18-19 to pay for the Mirena IUD.

## 2019-06-18 ENCOUNTER — Encounter (HOSPITAL_BASED_OUTPATIENT_CLINIC_OR_DEPARTMENT_OTHER): Payer: Self-pay | Admitting: Gynecologic Oncology

## 2019-06-18 ENCOUNTER — Other Ambulatory Visit: Payer: Self-pay

## 2019-06-18 NOTE — Progress Notes (Signed)
Spoke w/ via phone for pre-op interview---[facility] needs dos----  Type and screen            Lab results------cbc, cmet, ua, urine culture (urine culture results sent to [facility] cross no epic inbasket)EKG: 06-11-2019 epic COVID test -----06-21-2019 Arrive at -------630 am 06-24-2019 NPO after ------midnight food, clear liquids until 530 am then npo Medications to take morning of surgery -----fenofibrate Diabetic medication -----take 1/2 dose of humalog insulin hs on 06-23-2019, no diabetic medications am of surgery Patient Special Instructions -----light diet day before surgery per dr Berline Lopes, reviewed light diet options with patient. Pre-Op special Istructions -----patient stated surgery cancelled previously due to high blood sugar and blood sugar is now running 115 to 120 per patient, and she has spoke with dr Shon Baton for insulin management. Patient verbalized understanding of instructions that were given at this phone interview. Patient denies shortness of breath, chest pain, fever, cough a this phone interview.

## 2019-06-21 ENCOUNTER — Inpatient Hospital Stay (HOSPITAL_COMMUNITY): Admission: RE | Admit: 2019-06-21 | Payer: Medicare Other | Source: Ambulatory Visit

## 2019-06-21 NOTE — Telephone Encounter (Signed)
Spoke with Ms Borchers son [name] regarding IUD payment as surgery may need to be cancelled if IUD not purchased in the next couple of days. [name] stated that he will pay tomorrow morning at [facility] as he is paying with cash.

## 2019-06-22 ENCOUNTER — Ambulatory Visit: Payer: Medicare Other | Admitting: Gynecologic Oncology

## 2019-06-23 ENCOUNTER — Other Ambulatory Visit (HOSPITAL_COMMUNITY)
Admission: RE | Admit: 2019-06-23 | Discharge: 2019-06-23 | Disposition: A | Discharge status: Home / Self Care | Payer: Medicare Other | Source: Ambulatory Visit | Attending: Gynecologic Oncology | Admitting: Gynecologic Oncology

## 2019-06-23 ENCOUNTER — Telehealth: Payer: Self-pay | Admitting: *Deleted

## 2019-06-23 DIAGNOSIS — Z20822 Contact with and (suspected) exposure to covid-19: Secondary | ICD-10-CM | POA: Diagnosis not present

## 2019-06-23 DIAGNOSIS — Z01812 Encounter for preprocedural laboratory examination: Secondary | ICD-10-CM | POA: Insufficient documentation

## 2019-06-23 LAB — SARS CORONAVIRUS 2 (TAT 6-24 HRS): SARS Coronavirus 2: NEGATIVE

## 2019-06-23 NOTE — Telephone Encounter (Signed)
Entered in error

## 2019-06-23 NOTE — Telephone Encounter (Signed)
Pt denies questions or concerns regarding procedure tomorrow. Clinic number given to patient so she cal call back if she has any questions.

## 2019-06-24 ENCOUNTER — Ambulatory Visit (HOSPITAL_BASED_OUTPATIENT_CLINIC_OR_DEPARTMENT_OTHER)
Admission: RE | Admit: 2019-06-24 | Discharge: 2019-06-24 | Disposition: A | Discharge status: Home / Self Care | Payer: Medicare Other | Attending: Gynecologic Oncology | Admitting: Gynecologic Oncology

## 2019-06-24 ENCOUNTER — Other Ambulatory Visit: Payer: Self-pay

## 2019-06-24 ENCOUNTER — Ambulatory Visit (HOSPITAL_BASED_OUTPATIENT_CLINIC_OR_DEPARTMENT_OTHER): Payer: Medicare Other | Admitting: Anesthesiology

## 2019-06-24 ENCOUNTER — Encounter (HOSPITAL_BASED_OUTPATIENT_CLINIC_OR_DEPARTMENT_OTHER)
Admission: RE | Disposition: A | Discharge status: Home / Self Care | Payer: Self-pay | Source: Home / Self Care | Attending: Gynecologic Oncology

## 2019-06-24 ENCOUNTER — Encounter (HOSPITAL_BASED_OUTPATIENT_CLINIC_OR_DEPARTMENT_OTHER): Payer: Self-pay | Admitting: Gynecologic Oncology

## 2019-06-24 DIAGNOSIS — E1165 Type 2 diabetes mellitus with hyperglycemia: Secondary | ICD-10-CM | POA: Diagnosis not present

## 2019-06-24 DIAGNOSIS — I129 Hypertensive chronic kidney disease with stage 1 through stage 4 chronic kidney disease, or unspecified chronic kidney disease: Secondary | ICD-10-CM | POA: Insufficient documentation

## 2019-06-24 DIAGNOSIS — Z96652 Presence of left artificial knee joint: Secondary | ICD-10-CM | POA: Insufficient documentation

## 2019-06-24 DIAGNOSIS — Z794 Long term (current) use of insulin: Secondary | ICD-10-CM | POA: Insufficient documentation

## 2019-06-24 DIAGNOSIS — N72 Inflammatory disease of cervix uteri: Secondary | ICD-10-CM | POA: Insufficient documentation

## 2019-06-24 DIAGNOSIS — N183 Chronic kidney disease, stage 3 unspecified: Secondary | ICD-10-CM | POA: Diagnosis not present

## 2019-06-24 DIAGNOSIS — I2510 Atherosclerotic heart disease of native coronary artery without angina pectoris: Secondary | ICD-10-CM | POA: Insufficient documentation

## 2019-06-24 DIAGNOSIS — I700 Atherosclerosis of aorta: Secondary | ICD-10-CM | POA: Insufficient documentation

## 2019-06-24 DIAGNOSIS — Z96641 Presence of right artificial hip joint: Secondary | ICD-10-CM | POA: Diagnosis not present

## 2019-06-24 DIAGNOSIS — E1122 Type 2 diabetes mellitus with diabetic chronic kidney disease: Secondary | ICD-10-CM | POA: Insufficient documentation

## 2019-06-24 DIAGNOSIS — Z79899 Other long term (current) drug therapy: Secondary | ICD-10-CM | POA: Diagnosis not present

## 2019-06-24 DIAGNOSIS — N200 Calculus of kidney: Secondary | ICD-10-CM | POA: Diagnosis not present

## 2019-06-24 DIAGNOSIS — C541 Malignant neoplasm of endometrium: Secondary | ICD-10-CM | POA: Diagnosis present

## 2019-06-24 DIAGNOSIS — Z888 Allergy status to other drugs, medicaments and biological substances status: Secondary | ICD-10-CM | POA: Diagnosis not present

## 2019-06-24 DIAGNOSIS — K449 Diaphragmatic hernia without obstruction or gangrene: Secondary | ICD-10-CM | POA: Diagnosis not present

## 2019-06-24 DIAGNOSIS — K5730 Diverticulosis of large intestine without perforation or abscess without bleeding: Secondary | ICD-10-CM | POA: Insufficient documentation

## 2019-06-24 DIAGNOSIS — Z8249 Family history of ischemic heart disease and other diseases of the circulatory system: Secondary | ICD-10-CM | POA: Diagnosis not present

## 2019-06-24 DIAGNOSIS — N888 Other specified noninflammatory disorders of cervix uteri: Secondary | ICD-10-CM | POA: Insufficient documentation

## 2019-06-24 DIAGNOSIS — Z884 Allergy status to anesthetic agent status: Secondary | ICD-10-CM | POA: Insufficient documentation

## 2019-06-24 HISTORY — PX: DILATION AND CURETTAGE OF UTERUS: SHX78

## 2019-06-24 HISTORY — PX: INTRAUTERINE DEVICE (IUD) INSERTION: SHX5877

## 2019-06-24 HISTORY — DX: Other specified health status: Z78.9

## 2019-06-24 LAB — GLUCOSE, CAPILLARY
Glucose-Capillary: 96 mg/dL (ref 70–99)
Glucose-Capillary: 49 mg/dL — ABNORMAL LOW (ref 70–99)
Glucose-Capillary: 59 mg/dL — ABNORMAL LOW (ref 70–99)
Glucose-Capillary: 80 mg/dL (ref 70–99)

## 2019-06-24 SURGERY — DILATION AND CURETTAGE
Anesthesia: Monitor Anesthesia Care | Site: Vagina

## 2019-06-24 MED ORDER — ACETAMINOPHEN 500 MG PO TABS
1000.0000 mg | ORAL_TABLET | Freq: Once | ORAL | Status: AC
  Administered 2019-06-24: 1000 mg via ORAL
  Filled 2019-06-24: qty 2

## 2019-06-24 MED ORDER — SILVER NITRATE-POT NITRATE 75-25 % EX MISC
CUTANEOUS | Status: DC | PRN
  Administered 2019-06-24: 6

## 2019-06-24 MED ORDER — PROMETHAZINE HCL 25 MG/ML IJ SOLN
6.2500 mg | INTRAMUSCULAR | Status: DC | PRN
  Filled 2019-06-24: qty 1

## 2019-06-24 MED ORDER — DEXAMETHASONE SODIUM PHOSPHATE 10 MG/ML IJ SOLN
INTRAMUSCULAR | Status: DC | PRN
  Administered 2019-06-24: 8 mg via INTRAVENOUS

## 2019-06-24 MED ORDER — DIPHENHYDRAMINE HCL 50 MG/ML IJ SOLN
INTRAMUSCULAR | Status: DC | PRN
  Administered 2019-06-24: 12.5 mg via INTRAVENOUS

## 2019-06-24 MED ORDER — CELECOXIB 200 MG PO CAPS
ORAL_CAPSULE | ORAL | Status: AC
  Filled 2019-06-24: qty 1

## 2019-06-24 MED ORDER — FENTANYL CITRATE (PF) 100 MCG/2ML IJ SOLN
25.0000 ug | INTRAMUSCULAR | Status: DC | PRN
  Filled 2019-06-24: qty 1

## 2019-06-24 MED ORDER — ONDANSETRON HCL 4 MG/2ML IJ SOLN
INTRAMUSCULAR | Status: AC
  Filled 2019-06-24: qty 2

## 2019-06-24 MED ORDER — DIPHENHYDRAMINE HCL 50 MG/ML IJ SOLN
INTRAMUSCULAR | Status: AC
  Filled 2019-06-24: qty 1

## 2019-06-24 MED ORDER — CELECOXIB 200 MG PO CAPS
200.0000 mg | ORAL_CAPSULE | Freq: Once | ORAL | Status: AC
  Administered 2019-06-24: 07:00:00 200 mg via ORAL
  Filled 2019-06-24: qty 1

## 2019-06-24 MED ORDER — ACETAMINOPHEN 500 MG PO TABS
ORAL_TABLET | ORAL | Status: AC
  Filled 2019-06-24: qty 2

## 2019-06-24 MED ORDER — PROPOFOL 500 MG/50ML IV EMUL
INTRAVENOUS | Status: DC | PRN
  Administered 2019-06-24: 75 ug/kg/min via INTRAVENOUS

## 2019-06-24 MED ORDER — PROPOFOL 500 MG/50ML IV EMUL
INTRAVENOUS | Status: AC
  Filled 2019-06-24: qty 50

## 2019-06-24 MED ORDER — ONDANSETRON HCL 4 MG/2ML IJ SOLN
INTRAMUSCULAR | Status: DC | PRN
  Administered 2019-06-24: 4 mg via INTRAVENOUS

## 2019-06-24 MED ORDER — LACTATED RINGERS IV SOLN
INTRAVENOUS | Status: DC
  Filled 2019-06-24: qty 1000

## 2019-06-24 MED ORDER — PROPOFOL 10 MG/ML IV BOLUS
INTRAVENOUS | Status: DC | PRN
  Administered 2019-06-24: 30 mg via INTRAVENOUS
  Administered 2019-06-24 (×2): 10 mg via INTRAVENOUS

## 2019-06-24 MED ORDER — OXYCODONE HCL 5 MG PO TABS
5.0000 mg | ORAL_TABLET | ORAL | Status: DC | PRN
  Filled 2019-06-24: qty 2

## 2019-06-24 MED ORDER — LIDOCAINE HCL 1 % IJ SOLN
INTRAMUSCULAR | Status: DC | PRN
  Administered 2019-06-24: 10 mL

## 2019-06-24 MED ORDER — FERRIC SUBSULFATE 259 MG/GM EX SOLN
CUTANEOUS | Status: DC | PRN
  Administered 2019-06-24: 1

## 2019-06-24 MED ORDER — DEXAMETHASONE SODIUM PHOSPHATE 10 MG/ML IJ SOLN
INTRAMUSCULAR | Status: AC
  Filled 2019-06-24: qty 1

## 2019-06-24 SURGICAL SUPPLY — 23 items
CATH ROBINSON RED A/P 16FR (CATHETERS) ×1 IMPLANT
COVER WAND RF STERILE (DRAPES) ×2 IMPLANT
GLOVE BIO SURGEON STRL SZ 6 (GLOVE) ×4 IMPLANT
GLOVE BIO SURGEON STRL SZ 6.5 (GLOVE) ×1 IMPLANT
GLOVE BIO SURGEON STRL SZ7 (GLOVE) ×2 IMPLANT
GLOVE BIOGEL PI IND STRL 7.0 (GLOVE) IMPLANT
GLOVE BIOGEL PI IND STRL 7.5 (GLOVE) IMPLANT
GLOVE BIOGEL PI INDICATOR 7.0 (GLOVE) ×2
GLOVE BIOGEL PI INDICATOR 7.5 (GLOVE) ×1
GOWN STRL REUS W/ TWL LRG LVL3 (GOWN DISPOSABLE) ×2 IMPLANT
GOWN STRL REUS W/TWL LRG LVL3 (GOWN DISPOSABLE) ×4
KIT TURNOVER CYSTO (KITS) IMPLANT
NDL SPNL 22GX3.5 QUINCKE BK (NEEDLE) ×1 IMPLANT
NEEDLE SPNL 22GX3.5 QUINCKE BK (NEEDLE) ×2 IMPLANT
NS IRRIG 1000ML POUR BTL (IV SOLUTION) ×2 IMPLANT
PACK VAGINAL MINOR WOMEN LF (CUSTOM PROCEDURE TRAY) ×2 IMPLANT
PAD OB MATERNITY 4.3X12.25 (PERSONAL CARE ITEMS) ×2 IMPLANT
PENCIL SMOKE EVACUATOR (MISCELLANEOUS) IMPLANT
SWAB OB GYN 8IN STERILE 2PK (MISCELLANEOUS) ×1 IMPLANT
SYR BULB 3OZ (MISCELLANEOUS) ×1 IMPLANT
TOWEL OR 17X26 10 PK STRL BLUE (TOWEL DISPOSABLE) ×2 IMPLANT
UNDERPAD 30X36 HEAVY ABSORB (UNDERPADS AND DIAPERS) ×4 IMPLANT
mirena iud ×1 IMPLANT

## 2019-06-24 NOTE — Progress Notes (Signed)
Dr Tobias Alexander notified of l blood sugar pt asymptomatic and will give juice and recheck

## 2019-06-24 NOTE — Op Note (Addendum)
PATIENT: Cassandra Dodson DATE: 06/24/19   Preop Diagnosis: Grade 2 endometrial cancer, poorly controlled type II diabetes with hyperglycemia  Postoperative Diagnosis: same  Surgery: D&C (dilation and curettage), Mirena IUD insertion, cervical biopsy (6 o'c), vaginal biopsy (3 o'cl 2 cm distal to the cervicovaginal junction)  Surgeons:  Jeral Pinch, MD Assistant: Joylene John, NP  Anesthesia: MAC  Estimated blood loss: 25 ml  IVF:  See flowsheet  Urine output: none, no instrumentation given urinary colonization   Complications: None apparent  Pathology: endometrial curetteings, vaginal sidewall biopsy, cervical biopsy  Operative findings: Small mobile uterus on EUA, no nodularity. On speculum exam, what appeared to be 1-2cm nabothian cyst noted on inferior cervix (biopsied). 17m vesicular lesion on left vaginal sidewall (biopsied). Uterus sounded to 8cm. Copious tumor and blood noted on uterine curettage, good uterine cri at end of procedure. Mirena IUD ([phone] inserted without difficulty.   Procedure: The patient was identified in the preoperative holding area. Informed consent was signed on the chart. Patient was seen history was reviewed and exam was performed.   The patient was then taken to the operating room and placed in the supine position with SCD hose on. General anesthesia was then induced without difficulty. She was then placed in the dorsolithotomy position. The perineum was prepped with Betadine. The vagina was prepped with Betadine. The patient was then draped after the prep was dried.   Timeout was performed the patient, procedure, antibiotic, allergy, and length of procedure.   The speculum was placed in the vagina. The single tooth tenaculum was placed on the anterior lip of the cervix. 10cc of 1% lidocaine was injected paracervically for local anesthesia. The uterine sound was placed in the cervix and advanced to the fundus. The cervix was  successively dilated using pratts dilators to 69F. A sharp curette was advanced to the fundus and a comprehensive curette of the endometrial cavity took place until a gritty feel was appreciated. The specimen was collected on a telfa and sent for permanent pathology.  Mirena IUD was then placed to the uterine fundus, deployed, and the inserter was removed.  Biopsies were taken of the vaginal sidewall and cervix using biopsy forceps and placed in formalin. Silver nitrate and Monsels were used to achieve hemostasis.The tenaculum was removed and hemostasis was observed.   The vagina was irrigated.  All instrument, suture, laparotomy, Ray-Tec, and needle counts were correct x2.   The patient tolerated the procedure well and was taken recovery room in stable condition.   KJeral PinchMD Gynecologic Oncology

## 2019-06-24 NOTE — H&P (Signed)
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  Given poorly controlled diabetes and other medical co-morbidities, I have recommended D&C with Mirena IUD insertion while we optimize the patient for hysterectomy.  Jeral Pinch, MD  Division of Gynecologic Oncology  Department of Obstetrics and Gynecology  [facility]  ___________________________________________  History of Present Illness:  Cassandra Dodson is a 79 y.o. y.o. female who is seen in consultation at the request of Shon Baton, MD for an evaluation of uterine cancer.  The patient endorses postmenopausal bleeding that began about 6 months ago.  She had mild spotting and reached out to her primary care provider.  They discussed referral to urologist but then her bleeding stopped.  It began again in December and she had an episode with significant bleeding where there was "blood all over the bathroom".  She reached out to Dr. Virgina Jock and ultimately was referred to a gynecologist.  Since the episode of heavier bleeding, she reports intermittent spotting.  She had some pain and cramping with her heavier bleeding but denies any pain now.  She underwent ultrasound and endometrial biopsy on 1/5.  Pelvic ultrasound was notable for a small uterus but 1.7 cm endometrial lining with increased blood flow in calcifications.  Left ovary with simple cyst measuring in greatest dimension 2.1 cm.  Right ovary normal in appearance.  No free fluid.  She endorses having decreased appetite over the last month and not feeling very hungry.  She lives part-time with her son and is more interested in eating the food that he cooks.  She has had some intermittent nausea in the past but denies any recently.  She denies any emesis.  She has a history of diarrhea previously, and denies any change to her bowel function.  She endorses some urinary incontinence that sounds  like a combination of urgency and stress incontinence.  Her medical history is complicated by diabetes, for which she takes Metformin as well as long-acting insulin.  She notes that her sugars at home are in the 150s at the highest.  Her last hemoglobin A1c was less than 7 per her report.  She also has hypertension, but does not check her blood pressure at home.  She walks with a cane due to balance issues.  She has had both knees replaced.  She denies any chest pain or shortness of breath with ambulation.  She lives at home and her son stays with her part-time as he works in [place].  PAST MEDICAL HISTORY:  Past Medical History:  Diagnosis Date  . Arthritis   . CKD (chronic kidney disease), stage III    patient denies  . Depression   . Diabetes mellitus without complication ([place])   . Difficult intravenous access   . Endometrial cancer ([place])   . GERD (gastroesophageal reflux disease)   . Headache   . Hypertension   . Hypothyroidism   . Neuromuscular disorder ([facility])    neuropathy in feet  . Osteoarthritis   . PMB (postmenopausal bleeding)   . PONV (postoperative nausea and vomiting)    severe nausea and vomiting after knee replacement 06-2014, did ok with 2018 knee replacement  . Urinary frequency   . Wears glasses      PAST SURGICAL HISTORY:  Past Surgical History:  Procedure Laterality Date  . BREAST SURGERY     cyst removed  . CESAREAN SECTION    . FRACTURE SURGERY  right knee  . IRRIGATION AND DEBRIDEMENT SEBACEOUS CYST    . JOINT REPLACEMENT Right    right  . teeth extration    . TONSILLECTOMY    . TOTAL HIP ARTHROPLASTY Right 02/23/2015   Procedure: RIGHT TOTAL HIP ARTHROPLASTY ANTERIOR APPROACH;  Surgeon: Leandrew Koyanagi, MD;  Location: [place];  Service: Orthopedics;  Laterality: Right;  . TOTAL KNEE ARTHROPLASTY Left 06/27/2016   Procedure: LEFT TOTAL KNEE ARTHROPLASTY;  Surgeon: Leandrew Koyanagi, MD;  Location: [place] [facility];  Service: Orthopedics;  Laterality: Left;     OB/GYN HISTORY:  OB History  Gravida Para Term Preterm AB Living  1 1          SAB TAB Ectopic Multiple Live Births               # Outcome Date GA Lbr Len/2nd Weight Sex Delivery Anes PTL Lv  1 Para             No LMP recorded. Patient is postmenopausal.  MEDICATIONS: No current facility-administered medications on file prior to encounter.   Current Outpatient Medications on File Prior to Encounter  Medication Sig Dispense Refill  . calcium carbonate (CALCIUM 600) 600 MG TABS tablet Take 600 mg by mouth daily.    . fenofibrate 160 MG tablet Take 160 mg by mouth daily after breakfast.   2  . HUMALOG MIX 75/25 KWIKPEN (75-25) 100 UNIT/ML Kwikpen Inject 24 Units into the skin daily after breakfast. 24 units subcutaneously at bedtime, 30 units in am  3  . metFORMIN (GLUCOPHAGE) 1000 MG tablet Take 1,000 mg by mouth 2 (two) times daily.   5  . Multiple Vitamins-Minerals (MULTIVITAMIN WITH MINERALS) tablet Take 1 tablet by mouth daily. Woman    . telmisartan (MICARDIS) 80 MG tablet Take 80 mg by mouth daily after breakfast.     . levonorgestrel (MIRENA) 20 MCG/24HR IUD 1 Intra Uterine Device (1 each total) by Intrauterine route once for 1 dose. (Patient taking differently: 1 each by Intrauterine route once. To be inserted with surgery 06-24-2019) 1 each 0  . senna-docusate (SENOKOT S) 8.6-50 MG tablet Take 1 tablet by mouth at bedtime as needed. 30 tablet 1    ALLERGIES:  Allergies  Allergen Reactions  . Anesthetics, Amide Other (See Comments)    Pt is intolerant to general anesthesia. Pt will throw up and has thrown up during procedure.   . Ether Nausea And Vomiting    UNSPECIFIED REACTION      FAMILY HISTORY:  Family History  Problem Relation Age of Onset  . Pancreatic cancer Mother   . Stroke Father   . Hypertension Father      SOCIAL HISTORY:    Social Connections:   . Frequency of Communication with Friends and Family: Not on file  . Frequency of Social Gatherings  with Friends and Family: Not on file  . Attends Religious Services: Not on file  . Active Member of Clubs or Organizations: Not on file  . Attends Archivist Meetings: Not on file  . Marital Status: Not on file    REVIEW OF SYSTEMS:  Reports appetite change, weight loss due to appetite change, intermittent fatigue, hearing loss, ringing in ears, occasional shortness of breath, intermittent nausea, knee pain, chronic back pain, difficulty walking and with balance, intermittent headaches, bruising/bleeding easily, intermittent anxiety, occasional depression. Denies fevers, chills. Denies neck lumps or masses, mouth sores, or voice changes. Denies cough or wheezing.   Denies chest pain  or palpitations. Denies leg swelling. Denies abdominal distention, pain, blood in stools, constipation, diarrhea, vomiting, or early satiety. Denies pain with intercourse, dysuria, frequency, hematuria or incontinence. Denies hot flashes, pelvic pain, or vaginal discharge.  Denies joint pain, back pain or muscle pain/cramps. Denies itching, rash, or wounds. Denies dizziness, headaches, numbness or seizures. Denies swollen lymph nodes or glands. Denies confusion, or decreased concentration.   Physical Exam:  Vital Signs for this encounter:  Pulse (!) 109, temperature 97.8 F (36.6 C), temperature source Oral, resp. rate 18, height 5\' 5"  (1.651 m), weight 194 lb (88 kg), SpO2 97 %. Body mass index is 32.28 kg/m. General: Alert, oriented, no acute distress.  HEENT: Normocephalic, atraumatic. Sclera anicteric.  Chest: Unlabored breathing on room air  LABORATORY AND RADIOLOGIC DATA:   Lab Results  Component Value Date   WBC 9.5 06/11/2019   HGB 12.4 06/11/2019   HCT 41.4 06/11/2019   PLT 228 06/11/2019   GLUCOSE 336 (H) 06/11/2019   ALT 13 06/11/2019   AST 16 06/11/2019   NA 138 06/11/2019   K 4.2 06/11/2019   CL 103 06/11/2019   CREATININE 1.43 (H) 06/11/2019   BUN 22 06/11/2019    CO2 23 06/11/2019   INR 1.12 06/19/2016   HGBA1C 9.5 (H) 05/17/2019   Endometrial biopsy on 1/5: Endometrioid carcinoma, FIGO grade 2  CT A/P on 03/30/19: IMPRESSION: 1. No evidence for acute abnormality of the abdomen or pelvis. 2. Nonobstructing 6 millimeter RIGHT intrarenal calculus. 3. Normal CT appearance of the uterus. Consider pelvic ultrasound for further evaluation of postmenopausal bleeding. 4. Small hiatal hernia. 5. Coronary artery disease. 6. Colonic diverticulosis. 7. RIGHT hip arthroplasty. 8. Degenerative changes in the lumbar spine. 9. Aortic Atherosclerosis (ICD10-I70.0).  Outside lab work from December 2020:  Glucose 223, BUN 16, creatinine 1.2 Sodium 134 Hemoglobin 12.5, hematocrit 40.2 Platelets 236

## 2019-06-24 NOTE — Transfer of Care (Signed)
Immediate Anesthesia Transfer of Care Note  Patient: VESNA KABLE  Procedure(s) Performed: Procedure(s): DILATATION AND CURETTAGE (N/A) INTRAUTERINE DEVICE (IUD) INSERTION MIRENA (N/A)  Patient Location: PACU  Anesthesia Type:MAC  Level of Consciousness:  sedated, patient cooperative and responds to stimulation  Airway & Oxygen Therapy:Patient Spontanous Breathing and Patient connected to face mask oxgen  Post-op Assessment:  Report given to PACU RN and Post -op Vital signs reviewed and stable  Post vital signs:  Reviewed and stable  Last Vitals:  Vitals:   06/24/19 1015 06/24/19 1040  BP: (!) 158/86 (!) 157/90  Pulse: 92 90  Resp: 15 14  Temp:  (!) 36.1 C  SpO2: 25% 271%    Complications: No apparent anesthesia complications

## 2019-06-24 NOTE — Anesthesia Postprocedure Evaluation (Signed)
Anesthesia Post Note  Patient: Cassandra Dodson  Procedure(s) Performed: DILATATION AND CURETTAGE (N/A Vagina ) INTRAUTERINE DEVICE (IUD) INSERTION MIRENA (N/A Vagina )     Patient location during evaluation: PACU Anesthesia Type: MAC Level of consciousness: awake and alert Pain management: pain level controlled Vital Signs Assessment: post-procedure vital signs reviewed and stable Respiratory status: spontaneous breathing and respiratory function stable Cardiovascular status: stable Postop Assessment: no apparent nausea or vomiting Anesthetic complications: no    Last Vitals:  Vitals:   06/24/19 1015 06/24/19 1040  BP: (!) 158/86 (!) 157/90  Pulse: 92 90  Resp: 15 14  Temp:  (!) 36.1 C  SpO2: 95% 100%    Last Pain:  Vitals:   06/24/19 0635  TempSrc: Oral                 Malcome Ambrocio DANIEL

## 2019-06-24 NOTE — Discharge Instructions (Addendum)
06/24/2019  Activity: 1. Be up and out of the bed during the day.  Take a nap if needed.  You may walk up steps but be careful and use the hand rail.  Stair climbing will tire you more than you think, you may need to stop part way and rest.   2. No lifting or straining for 2 weeks.  3. No driving if you are taking narcotic pain medicine and until you feel that you can brake safely.  4. Shower daily.  Use soap and water on your incision and pat dry; don't rub.   5. No sexual activity and nothing in the vagina for 2-4 weeks. Post Anesthesia Home Care Instructions  Activity: Get plenty of rest for the remainder of the day. A responsible adult should stay with you for 24 hours following the procedure.  For the next 24 hours, DO NOT: -Drive a car -Paediatric nurse -Drink alcoholic beverages -Take any medication unless instructed by your physician -Make any legal decisions or sign important papers.  Meals: Start with liquid foods such as gelatin or soup. Progress to regular foods as tolerated. Avoid greasy, spicy, heavy foods. If nausea and/or vomiting occur, drink only clear liquids until the nausea and/or vomiting subsides. Call your physician if vomiting continues.  Special Instructions/Symptoms: Your throat may feel dry or sore from the anesthesia or the breathing tube placed in your throat during surgery. If this causes discomfort, gargle with warm salt water. The discomfort should disappear within 24 hours.  If you had a scopolamine patch placed behind your ear for the management of post- operative nausea and/or vomiting:  1. The medication in the patch is effective for 72 hours, after which it should be removed.  Wrap patch in a tissue and discard in the trash. Wash hands thoroughly with soap and water. 2. You may remove the patch earlier than 72 hours if you experience unpleasant side effects which may include dry mouth, dizziness or visual disturbances. 3. Avoid touching the patch.  Wash your hands with soap and water after contact with the patch.     Medications:  - Take ibuprofen and tylenol first line for pain control. Take these regularly (every 6 hours) to decrease the build up of pain.  Diet: 1. Low sodium Heart Healthy Diet is recommended.  2. It is safe to use a laxative if you have difficulty moving your bowels.   Reasons to call the Doctor:   Fever - Oral temperature greater than 100.4 degrees Fahrenheit  Foul-smelling vaginal discharge  Difficulty urinating  Nausea and vomiting  Difficulty breathing with or without chest pain  New calf pain especially if only on one side  Sudden, continuing increased vaginal bleeding with or without clots.   Follow-up: 1. Phone call with Dr. Berline Lopes in 1 week.  Contacts: For questions or concerns you should contact:  Dr. Jeral Pinch at [phone] After hours and on week-ends call [phone] and ask to speak to the physician on call for Gynecologic Oncology  Levonorgestrel intrauterine device (IUD) What is this medicine? LEVONORGESTREL IUD (LEE voe nor jes trel) is a contraceptive (birth control) device. The device is placed inside the uterus by a healthcare professional. It is being used to treat the endometrial cancer inside your uterus. This medicine may be used for other purposes; ask your health care provider or pharmacist if you have questions. COMMON BRAND NAME(S): Minette Headland  What may interact with this medicine? Do not take this medicine  with any of the following medications:  amprenavir  bosentan  fosamprenavir This medicine may also interact with the following medications:  aprepitant  armodafinil  barbiturate medicines for inducing sleep or treating seizures  bexarotene  boceprevir  griseofulvin  medicines to treat seizures like carbamazepine, ethotoin, felbamate, oxcarbazepine, phenytoin,  topiramate  modafinil  pioglitazone  rifabutin  rifampin  rifapentine  some medicines to treat HIV infection like atazanavir, efavirenz, indinavir, lopinavir, nelfinavir, tipranavir, ritonavir  St. John's wort  warfarin This list may not describe all possible interactions. Give your health care provider a list of all the medicines, herbs, non-prescription drugs, or dietary supplements you use. Also tell them if you smoke, drink alcohol, or use illegal drugs. Some items may interact with your medicine. What should I watch for while using this medicine? Visit your doctor or health care professional for regular check ups. See your doctor if you or your partner has sexual contact with others, becomes HIV positive, or gets a sexual transmitted disease. This product does not protect you against HIV infection (AIDS) or other sexually transmitted diseases. You can check the placement of the IUD yourself by reaching up to the top of your vagina with clean fingers to feel the threads. Do not pull on the threads. It is a good habit to check placement after each menstrual period. Call your doctor right away if you feel more of the IUD than just the threads or if you cannot feel the threads at all. The IUD may come out by itself. You may become pregnant if the device comes out. If you notice that the IUD has come out use a backup birth control method like condoms and call your health care provider. Using tampons will not change the position of the IUD and are okay to use during your period. This IUD can be safely scanned with magnetic resonance imaging (MRI) only under specific conditions. Before you have an MRI, tell your healthcare provider that you have an IUD in place, and which type of IUD you have in place. What side effects may I notice from receiving this medicine? Side effects that you should report to your doctor or health care professional as soon as possible:  allergic reactions like skin  rash, itching or hives, swelling of the face, lips, or tongue  fever, flu-like symptoms  genital sores  high blood pressure  no menstrual period for 6 weeks during use  pain, swelling, warmth in the leg  pelvic pain or tenderness  severe or sudden headache  signs of pregnancy  stomach cramping  sudden shortness of breath  trouble with balance, talking, or walking  unusual vaginal bleeding, discharge  yellowing of the eyes or skin Side effects that usually do not require medical attention (report to your doctor or health care professional if they continue or are bothersome):  acne  breast pain  change in sex drive or performance  changes in weight  cramping, dizziness, or faintness while the device is being inserted  headache  irregular menstrual bleeding within first 3 to 6 months of use  nausea This list may not describe all possible side effects. Call your doctor for medical advice about side effects. You may report side effects to FDA at 1-800-FDA-1088. Where should I keep my medicine? This does not apply. NOTE: This sheet is a summary. It may not cover all possible information. If you have questions about this medicine, talk to your doctor, pharmacist, or health care provider.  2020 Elsevier/Gold Standard (  2018-02-17 13:22:01)    

## 2019-06-24 NOTE — Anesthesia Preprocedure Evaluation (Addendum)
Anesthesia Evaluation  Patient identified by MRN, date of birth, ID band Patient awake    Reviewed: Allergy & Precautions, NPO status , Patient's Chart, lab work & pertinent test results  History of Anesthesia Complications (+) PONV and history of anesthetic complications  Airway Mallampati: II  TM Distance: >3 FB Neck ROM: Full    Dental  (+) Partial Upper, Partial Lower, Missing   Pulmonary neg pulmonary ROS, former smoker,    Pulmonary exam normal breath sounds clear to auscultation       Cardiovascular hypertension, Pt. on medications negative cardio ROS Normal cardiovascular exam Rhythm:Regular Rate:Normal     Neuro/Psych PSYCHIATRIC DISORDERS Depression negative neurological ROS     GI/Hepatic Neg liver ROS, GERD  ,  Endo/Other  diabetes, Well Controlled, Type 2, Insulin DependentHypothyroidism   Renal/GU Renal InsufficiencyRenal disease  negative genitourinary   Musculoskeletal  (+) Arthritis , Osteoarthritis,  S/P Bilateral TKR and Right THR   Abdominal (+) + obese,   Peds  Hematology negative hematology ROS (+)   Anesthesia Other Findings Day of surgery medications reviewed with the patient.  Reproductive/Obstetrics                            Anesthesia Physical Anesthesia Plan  ASA: III  Anesthesia Plan: MAC   Post-op Pain Management:    Induction: Intravenous  PONV Risk Score and Plan: 4 or greater and Ondansetron, Dexamethasone and Diphenhydramine  Airway Management Planned: Natural Airway and Nasal Cannula  Additional Equipment:   Intra-op Plan:   Post-operative Plan: Extubation in OR  Informed Consent: I have reviewed the patients History and Physical, chart, labs and discussed the procedure including the risks, benefits and alternatives for the proposed anesthesia with the patient or authorized representative who has indicated his/her understanding and  acceptance.     Dental advisory given  Plan Discussed with: Anesthesiologist and CRNA  Anesthesia Plan Comments:        Anesthesia Quick Evaluation

## 2019-06-25 ENCOUNTER — Telehealth: Payer: Self-pay | Admitting: Gynecologic Oncology

## 2019-06-25 ENCOUNTER — Telehealth: Payer: Self-pay

## 2019-06-25 LAB — SURGICAL PATHOLOGY

## 2019-06-25 NOTE — Telephone Encounter (Signed)
Cassandra Dodson states that she is eating, drinking, and urinating well. No N/V No abdominal pain or cramping. Afebrile. She is passing gas and had a small BM last night. She will get some stool softener to take the next couple of days until she is back on her usual schedule.She is aware of her appointment in June for follow up and the office number [phone] to call if she has any questions or concerns.

## 2019-06-25 NOTE — Telephone Encounter (Signed)
Called patient - she is doing well after procedure. Reviewed pathology, happy to hear vaginal and cervical biopsies negative for cancer. Jeral Pinch MD Gynecologic Oncology

## 2019-07-05 ENCOUNTER — Ambulatory Visit: Payer: Medicare Other | Admitting: Gynecologic Oncology

## 2019-08-13 ENCOUNTER — Telehealth: Payer: Self-pay | Admitting: *Deleted

## 2019-08-13 NOTE — Telephone Encounter (Signed)
Received records from Dr Keane Police office. Copy given to [facility] APP and copy sent to be scanned

## 2019-08-18 ENCOUNTER — Telehealth: Payer: Self-pay | Admitting: *Deleted

## 2019-08-18 NOTE — Telephone Encounter (Signed)
Called the patient to schedule appt for discuss surgery. Patient ask the I call her back tomorrow; so she can speak with her transportation

## 2019-08-19 ENCOUNTER — Telehealth: Payer: Self-pay | Admitting: *Deleted

## 2019-08-19 NOTE — Telephone Encounter (Signed)
Patient called back and scheduled an appt for 5/3

## 2019-08-19 NOTE — Telephone Encounter (Signed)
Attempted to contact the patient to schedule an appt. Left a message to call the office back

## 2019-08-21 NOTE — Progress Notes (Signed)
Gynecologic Oncology Return Clinic Visit  5/3  Reason for Visit: follow-up in the setting of uterine cancer with ongoing medical therapy  Treatment History: Oncology History Overview Note  Surgery initially scheduled for early March 2021, delayed given uncontrolled diabetes. Mirena IUD placed instead   Endometrial cancer ([place])  03/30/2019 Imaging   CT A/P: No evidence of metastatic disease   04/27/2019 Initial Biopsy   EMB - Gr 2 EMCA   04/27/2019 Imaging   TVUS - 1.7cm endometrial lining, simple left ovarian cyst   04/27/2019 Initial Diagnosis   Endometrial cancer ([place])   06/24/2019 Surgery   D&C, Mirena IUD placement, vaginal and cervical biopsies Grade 2 endometrioid cancer Vaginal and cervical biopsies benign     Interval History: The patient reports doing well since her D&C.  She has had minimal and sporadic spotting, about once a week with a spot noticed on her adult diaper.  She denies any abdominal pain or pelvic cramping.  Her appetite has continued to be up and down.  She is currently most interested in eating orange Jell-O and white chicken salad.  She also continues to endorse constipation and is working on drinking a lot of water.  She is not taking anything else for this symptom.  She has been checking her blood sugars at home sporadically.  Most recently her blood sugar was 150.  She denies any over 200.  She recently saw her primary care provider, Dr. Virgina Jock.  Her hemoglobin A1c most recently was 7.4% and her blood pressure at that visit was 140/80.  He cleared her for staging surgery for her endometrial cancer.  Past Medical/Surgical History: Past Medical History:  Diagnosis Date  . Arthritis   . CKD (chronic kidney disease), stage III    patient denies  . Depression   . Diabetes mellitus without complication ([place])   . Difficult intravenous access   . Endometrial cancer ([place])   . GERD (gastroesophageal reflux disease)   . Headache   . Hypertension   .  Hypothyroidism   . Neuromuscular disorder (HCC)    neuropathy in feet  . Osteoarthritis   . PMB (postmenopausal bleeding)   . PONV (postoperative nausea and vomiting)    severe nausea and vomiting after knee replacement 06-2014, did ok with 2018 knee replacement  . Urinary frequency   . Wears glasses     Past Surgical History:  Procedure Laterality Date  . BREAST SURGERY     cyst removed  . CESAREAN SECTION    . DILATION AND CURETTAGE OF UTERUS N/A 06/24/2019   Procedure: DILATATION AND CURETTAGE;  Surgeon: Lafonda Mosses, MD;  Location: [facility];  Service: Gynecology;  Laterality: N/A;  . FRACTURE SURGERY     right knee  . INTRAUTERINE DEVICE (IUD) INSERTION N/A 06/24/2019   Procedure: INTRAUTERINE DEVICE (IUD) INSERTION MIRENA;  Surgeon: Lafonda Mosses, MD;  Location: [facility];  Service: Gynecology;  Laterality: N/A;  . IRRIGATION AND DEBRIDEMENT SEBACEOUS CYST    . JOINT REPLACEMENT Right    right  . teeth extration    . TONSILLECTOMY    . TOTAL HIP ARTHROPLASTY Right 02/23/2015   Procedure: RIGHT TOTAL HIP ARTHROPLASTY ANTERIOR APPROACH;  Surgeon: Leandrew Koyanagi, MD;  Location: [place];  Service: Orthopedics;  Laterality: Right;  . TOTAL KNEE ARTHROPLASTY Left 06/27/2016   Procedure: LEFT TOTAL KNEE ARTHROPLASTY;  Surgeon: Leandrew Koyanagi, MD;  Location: [place];  Service: Orthopedics;  Laterality: Left;  Family History  Problem Relation Age of Onset  . Pancreatic cancer Mother   . Stroke Father   . Hypertension Father     Social History   Socioeconomic History  . Marital status: Widowed    Spouse name: Not on file  . Number of children: Not on file  . Years of education: Not on file  . Highest education level: Not on file  Occupational History  . Not on file  Tobacco Use  . Smoking status: Former Smoker    Packs/day: 1.50    Years: 45.00    Pack years: 67.50    Types: Cigarettes    Quit date: 04/22/2002    Years since  quitting: 17.3  . Smokeless tobacco: Never Used  Substance and Sexual Activity  . Alcohol use: Yes    Comment: rarely  . Drug use: No  . Sexual activity: Not on file  Other Topics Concern  . Not on file  Social History Narrative  . Not on file   Social Determinants of Health   Financial Resource Strain:   . Difficulty of Paying Living Expenses:   Food Insecurity:   . Worried About Charity fundraiser in the Last Year:   . Arboriculturist in the Last Year:   Transportation Needs:   . Film/video editor (Medical):   Marland Kitchen Lack of Transportation (Non-Medical):   Physical Activity:   . Days of Exercise per Week:   . Minutes of Exercise per Session:   Stress:   . Feeling of Stress :   Social Connections:   . Frequency of Communication with Friends and Family:   . Frequency of Social Gatherings with Friends and Family:   . Attends Religious Services:   . Active Member of Clubs or Organizations:   . Attends Archivist Meetings:   Marland Kitchen Marital Status:     Current Medications:  Current Outpatient Medications:  .  calcium carbonate (CALCIUM 600) 600 MG TABS tablet, Take 600 mg by mouth daily., Disp: , Rfl:  .  fenofibrate 160 MG tablet, Take 160 mg by mouth daily after breakfast. , Disp: , Rfl: 2 .  HUMALOG MIX 75/25 KWIKPEN (75-25) 100 UNIT/ML Kwikpen, Inject 24 Units into the skin daily after breakfast. 24 units subcutaneously at bedtime, 30 units in am, Disp: , Rfl: 3 .  metFORMIN (GLUCOPHAGE) 1000 MG tablet, Take 1,000 mg by mouth 2 (two) times daily. , Disp: , Rfl: 5 .  Multiple Vitamins-Minerals (MULTIVITAMIN WITH MINERALS) tablet, Take 1 tablet by mouth daily. Woman, Disp: , Rfl:  .  senna-docusate (SENOKOT S) 8.6-50 MG tablet, Take 1 tablet by mouth at bedtime as needed., Disp: 30 tablet, Rfl: 1 .  telmisartan (MICARDIS) 80 MG tablet, Take 80 mg by mouth daily after breakfast. , Disp: , Rfl:  .  levonorgestrel (MIRENA) 20 MCG/24HR IUD, 1 Intra Uterine Device (1 each  total) by Intrauterine route once for 1 dose. (Patient taking differently: 1 each by Intrauterine route once. To be inserted with surgery 06-24-2019), Disp: 1 each, Rfl: 0  Review of Systems: + constipation, fatigue Denies appetite changes, fevers, chills, unexplained weight changes. Denies hearing loss, neck lumps or masses, mouth sores, ringing in ears or voice changes. Denies cough or wheezing.  Denies shortness of breath. Denies chest pain or palpitations. Denies leg swelling. Denies abdominal distention, pain, blood in stools, diarrhea, nausea, vomiting, or early satiety. Denies pain with intercourse, dysuria, frequency, hematuria or incontinence. Denies hot flashes, pelvic  pain, or vaginal discharge.   Denies joint pain, back pain or muscle pain/cramps. Denies itching, rash, or wounds. Denies dizziness, headaches, numbness or seizures. Denies swollen lymph nodes or glands, denies easy bruising or bleeding. Denies anxiety, depression, confusion, or decreased concentration.  Physical Exam: BP (!) 150/128 (BP Location: Right Arm, Patient Position: Sitting)   Temp 98.5 F (36.9 C) (Temporal)   Resp 18   Ht 5\' 5"  (1.651 m)   Wt 192 lb (87.1 kg)   SpO2 98%   BMI 31.95 kg/m  General: Alert, oriented, no acute distress.  HEENT: Atraumatic, normocephalic, sclera anicteric.  Hard of hearing. Chest: Clear to auscultation bilaterally.  Mild expiratory wheezing, no rhonchi. Cardiovascular: Regular rate and rhythm, no murmurs.  Laboratory & Radiologic Studies: None new  Assessment & Plan: Cassandra Dodson is a 79 y.o. 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  Patient overall is doing quite well today.  She recently saw her primary care provider after working on diabetic control to get her ready for possible uterine staging surgery.  Her  hemoglobin A1c was recently 7.4%.  She has been cleared by her PCP to proceed with surgery.  The patient is a suitable candidate for staging via a minimally invasive approach to surgery.  We reviewed that robotic assistance would be used to complete the surgery. We discussed that most endometrial cancer is detected early and that decisions regarding adjuvant therapy will be made based on her final pathology.   We reviewed the sentinel lymph node technique. Risks and benefits of sentinel lymph node biopsy was reviewed. We reviewed the technique and ICG dye. The patient DOES NOT have an iodine allergy or known liver dysfunction. We reviewed the false negative rate (0.4%), and that 3% of patients with metastatic disease will not have it detected by SLN biopsy in endometrial cancer. A low risk of allergic reaction to the dye, <0.2% for ICG, has been reported. We also discussed that in the case of failed mapping, which occurs 40% of the time, a bilateral or unilateral lymphadenectomy will be performed at the surgeon's discretion.   Potential benefits of sentinel nodes including a higher detection rate for metastasis due to ultrastaging and potential reduction in operative morbidity. However, there remains uncertainty as to the role for treatment of micrometastatic disease. Further, the benefit of operative morbidity associated with the SLN technique in endometrial cancer is not yet completely known. In other patient populations (e.g. the cervical cancer population) there has been observed reductions in morbidity with SLN biopsy compared to pelvic lymphadenectomy. Lymphedema, nerve dysfunction and lymphocysts are all potential risks with the SLN technique as with complete lymphadenectomy. Additional risks to the patient include the risk of damage to an internal organ while operating in an altered view (e.g. the black and white image of the robotic fluorescence imaging mode).   The patient was consented for a  robotic assisted hysterectomy, bilateral salpingo-oophorectomy, sentinel lymph node evaluation, possible lymph node dissection, possible laparotomy. The risks of surgery were discussed in detail and she understands these to include infection; wound separation; hernia; vaginal cuff separation, injury to adjacent organs such as bowel, bladder, blood vessels, ureters and nerves; bleeding which may require blood transfusion; anesthesia risk; thromboembolic events; possible death; unforeseen complications; possible need for re-exploration; medical complications such as heart attack, stroke, pleural effusion and pneumonia; and, if full lymphadenectomy is  performed the risk of lymphedema and lymphocyst. The patient will receive DVT and antibiotic prophylaxis as indicated. She voiced a clear understanding. She had the opportunity to ask questions. Perioperative instructions were reviewed with her. Prescriptions for post-op medications were sent to her pharmacy of choice.  35 minutes of total time was spent for this patient encounter, including preparation, face-to-face counseling with the patient and coordination of care, and documentation of the encounter.  Jeral Pinch, MD  Division of Gynecologic Oncology  Department of Obstetrics and Gynecology  [facility]

## 2019-08-21 NOTE — H&P (View-Only) (Signed)
Gynecologic Oncology Return Clinic Visit  5/3  Reason for Visit: follow-up in the setting of uterine cancer with ongoing medical therapy  Treatment History: Oncology History Overview Note  Surgery initially scheduled for early March 2021, delayed given uncontrolled diabetes. Mirena IUD placed instead   Endometrial cancer ([place]andra Dodson)  03/30/2019 Imaging   CT A/P: No evidence of metastatic disease   04/27/2019 Initial Biopsy   EMB - Gr 2 EMCA   04/27/2019 Imaging   TVUS - 1.7cm endometrial lining, simple left ovarian cyst   04/27/2019 Initial Diagnosis   Endometrial cancer (Cassandra Dodson)   06/24/2019 Surgery   D&C, Mirena IUD placement, vaginal and cervical biopsies Grade 2 endometrioid cancer Vaginal and cervical biopsies benign     Interval History: The patient reports doing well since her D&C.  She has had minimal and sporadic spotting, about once a week with a spot noticed on her adult diaper.  She denies any abdominal pain or pelvic cramping.  Her appetite has continued to be up and down.  She is currently most interested in eating orange Jell-O and white chicken salad.  She also continues to endorse constipation and is working on drinking a lot of water.  She is not taking anything else for this symptom.  She has been checking her blood sugars at home sporadically.  Most recently her blood sugar was 150.  She denies any over 200.  She recently saw her primary care provider, Dr. Virgina Jock.  Her hemoglobin A1c most recently was 7.4% and her blood pressure at that visit was 140/80.  He cleared her for staging surgery for her endometrial cancer.  Past Medical/Surgical History: Past Medical History:  Diagnosis Date  . Arthritis   . CKD (chronic kidney disease), stage III    patient denies  . Depression   . Diabetes mellitus without complication ([place])   . Difficult intravenous access   . Endometrial cancer ([place])   . GERD (gastroesophageal reflux disease)   . Headache   . Hypertension   .  Hypothyroidism   . Neuromuscular disorder (HCC)    neuropathy in feet  . Osteoarthritis   . PMB (postmenopausal bleeding)   . PONV (postoperative nausea and vomiting)    severe nausea and vomiting after knee replacement 06-2014, did ok with 2018 knee replacement  . Urinary frequency   . Wears glasses     Past Surgical History:  Procedure Laterality Date  . BREAST SURGERY     cyst removed  . CESAREAN SECTION    . DILATION AND CURETTAGE OF UTERUS N/A 06/24/2019   Procedure: DILATATION AND CURETTAGE;  Surgeon: Lafonda Mosses, MD;  [facility];  Service: Gynecology;  Laterality: N/A;  . FRACTURE SURGERY     right knee  . INTRAUTERINE DEVICE (IUD) INSERTION N/A 06/24/2019   Procedure: INTRAUTERINE DEVICE (IUD) INSERTION MIRENA;  Surgeon: Lafonda Mosses, MD;  Location: [facility];  Service: Gynecology;  Laterality: N/A;  . IRRIGATION AND DEBRIDEMENT SEBACEOUS CYST    . JOINT REPLACEMENT Right    right  . teeth extration    . TONSILLECTOMY    . TOTAL HIP ARTHROPLASTY Right 02/23/2015   Procedure: RIGHT TOTAL HIP ARTHROPLASTY ANTERIOR APPROACH;  Surgeon: Leandrew Koyanagi, MD;  Location: [place];  Service: Orthopedics;  Laterality: Right;  . TOTAL KNEE ARTHROPLASTY Left 06/27/2016   Procedure: LEFT TOTAL KNEE ARTHROPLASTY;  Surgeon: Leandrew Koyanagi, MD;  Location: [place];  Service: Orthopedics;  Laterality: Left;  Family History  Problem Relation Age of Onset  . Pancreatic cancer Mother   . Stroke Father   . Hypertension Father     Social History   Socioeconomic History  . Marital status: Widowed    Spouse name: Not on file  . Number of children: Not on file  . Years of education: Not on file  . Highest education level: Not on file  Occupational History  . Not on file  Tobacco Use  . Smoking status: Former Smoker    Packs/day: 1.50    Years: 45.00    Pack years: 67.50    Types: Cigarettes    Quit date: 04/22/2002    Years since  quitting: 17.3  . Smokeless tobacco: Never Used  Substance and Sexual Activity  . Alcohol use: Yes    Comment: rarely  . Drug use: No  . Sexual activity: Not on file  Other Topics Concern  . Not on file  Social History Narrative  . Not on file   Social Determinants of Health   Financial Resource Strain:   . Difficulty of Paying Living Expenses:   Food Insecurity:   . Worried About Charity fundraiser in the Last Year:   . Arboriculturist in the Last Year:   Transportation Needs:   . Film/video editor (Medical):   Marland Kitchen Lack of Transportation (Non-Medical):   Physical Activity:   . Days of Exercise per Week:   . Minutes of Exercise per Session:   Stress:   . Feeling of Stress :   Social Connections:   . Frequency of Communication with Friends and Family:   . Frequency of Social Gatherings with Friends and Family:   . Attends Religious Services:   . Active Member of Clubs or Organizations:   . Attends Archivist Meetings:   Marland Kitchen Marital Status:     Current Medications:  Current Outpatient Medications:  .  calcium carbonate (CALCIUM 600) 600 MG TABS tablet, Take 600 mg by mouth daily., Disp: , Rfl:  .  fenofibrate 160 MG tablet, Take 160 mg by mouth daily after breakfast. , Disp: , Rfl: 2 .  HUMALOG MIX 75/25 KWIKPEN (75-25) 100 UNIT/ML Kwikpen, Inject 24 Units into the skin daily after breakfast. 24 units subcutaneously at bedtime, 30 units in am, Disp: , Rfl: 3 .  metFORMIN (GLUCOPHAGE) 1000 MG tablet, Take 1,000 mg by mouth 2 (two) times daily. , Disp: , Rfl: 5 .  Multiple Vitamins-Minerals (MULTIVITAMIN WITH MINERALS) tablet, Take 1 tablet by mouth daily. Woman, Disp: , Rfl:  .  senna-docusate (SENOKOT S) 8.6-50 MG tablet, Take 1 tablet by mouth at bedtime as needed., Disp: 30 tablet, Rfl: 1 .  telmisartan (MICARDIS) 80 MG tablet, Take 80 mg by mouth daily after breakfast. , Disp: , Rfl:  .  levonorgestrel (MIRENA) 20 MCG/24HR IUD, 1 Intra Uterine Device (1 each  total) by Intrauterine route once for 1 dose. (Patient taking differently: 1 each by Intrauterine route once. To be inserted with surgery 06-24-2019), Disp: 1 each, Rfl: 0  Review of Systems: + constipation, fatigue Denies appetite changes, fevers, chills, unexplained weight changes. Denies hearing loss, neck lumps or masses, mouth sores, ringing in ears or voice changes. Denies cough or wheezing.  Denies shortness of breath. Denies chest pain or palpitations. Denies leg swelling. Denies abdominal distention, pain, blood in stools, diarrhea, nausea, vomiting, or early satiety. Denies pain with intercourse, dysuria, frequency, hematuria or incontinence. Denies hot flashes, pelvic  pain, or vaginal discharge.   Denies joint pain, back pain or muscle pain/cramps. Denies itching, rash, or wounds. Denies dizziness, headaches, numbness or seizures. Denies swollen lymph nodes or glands, denies easy bruising or bleeding. Denies anxiety, depression, confusion, or decreased concentration.  Physical Exam: BP (!) 150/128 (BP Location: Right Arm, Patient Position: Sitting)   Temp 98.5 F (36.9 C) (Temporal)   Resp 18   Ht 5\' 5"  (1.651 m)   Wt 192 lb (87.1 kg)   SpO2 98%   BMI 31.95 kg/m  General: Alert, oriented, no acute distress.  HEENT: Atraumatic, normocephalic, sclera anicteric.  Hard of hearing. Chest: Clear to auscultation bilaterally.  Mild expiratory wheezing, no rhonchi. Cardiovascular: Regular rate and rhythm, no murmurs.  Laboratory & Radiologic Studies: None new  Assessment & Plan: Cassandra Dodson is a 79 y.o. 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  Patient overall is doing quite well today.  She recently saw her primary care provider after working on diabetic control to get her ready for possible uterine staging surgery.  Her  hemoglobin A1c was recently 7.4%.  She has been cleared by her PCP to proceed with surgery.  The patient is a suitable candidate for staging via a minimally invasive approach to surgery.  We reviewed that robotic assistance would be used to complete the surgery. We discussed that most endometrial cancer is detected early and that decisions regarding adjuvant therapy will be made based on her final pathology.   We reviewed the sentinel lymph node technique. Risks and benefits of sentinel lymph node biopsy was reviewed. We reviewed the technique and ICG dye. The patient DOES NOT have an iodine allergy or known liver dysfunction. We reviewed the false negative rate (0.4%), and that 3% of patients with metastatic disease will not have it detected by SLN biopsy in endometrial cancer. A low risk of allergic reaction to the dye, <0.2% for ICG, has been reported. We also discussed that in the case of failed mapping, which occurs 40% of the time, a bilateral or unilateral lymphadenectomy will be performed at the surgeon's discretion.   Potential benefits of sentinel nodes including a higher detection rate for metastasis due to ultrastaging and potential reduction in operative morbidity. However, there remains uncertainty as to the role for treatment of micrometastatic disease. Further, the benefit of operative morbidity associated with the SLN technique in endometrial cancer is not yet completely known. In other patient populations (e.g. the cervical cancer population) there has been observed reductions in morbidity with SLN biopsy compared to pelvic lymphadenectomy. Lymphedema, nerve dysfunction and lymphocysts are all potential risks with the SLN technique as with complete lymphadenectomy. Additional risks to the patient include the risk of damage to an internal organ while operating in an altered view (e.g. the black and white image of the robotic fluorescence imaging mode).   The patient was consented for a  robotic assisted hysterectomy, bilateral salpingo-oophorectomy, sentinel lymph node evaluation, possible lymph node dissection, possible laparotomy. The risks of surgery were discussed in detail and she understands these to include infection; wound separation; hernia; vaginal cuff separation, injury to adjacent organs such as bowel, bladder, blood vessels, ureters and nerves; bleeding which may require blood transfusion; anesthesia risk; thromboembolic events; possible death; unforeseen complications; possible need for re-exploration; medical complications such as heart attack, stroke, pleural effusion and pneumonia; and, if full lymphadenectomy is  performed the risk of lymphedema and lymphocyst. The patient will receive DVT and antibiotic prophylaxis as indicated. She voiced a clear understanding. She had the opportunity to ask questions. Perioperative instructions were reviewed with her. Prescriptions for post-op medications were sent to her pharmacy of choice.  35 minutes of total time was spent for this patient encounter, including preparation, face-to-face counseling with the patient and coordination of care, and documentation of the encounter.  Jeral Pinch, MD  Division of Gynecologic Oncology  Department of Obstetrics and Gynecology  [facility]

## 2019-08-23 ENCOUNTER — Other Ambulatory Visit: Payer: Self-pay

## 2019-08-23 ENCOUNTER — Inpatient Hospital Stay: Payer: Medicare Other | Attending: Gynecologic Oncology | Admitting: Gynecologic Oncology

## 2019-08-23 ENCOUNTER — Encounter: Payer: Self-pay | Admitting: Gynecologic Oncology

## 2019-08-23 VITALS — BP 150/128 | Temp 98.5°F | Resp 18 | Ht 65.0 in | Wt 192.0 lb

## 2019-08-23 DIAGNOSIS — N183 Chronic kidney disease, stage 3 unspecified: Secondary | ICD-10-CM | POA: Diagnosis not present

## 2019-08-23 DIAGNOSIS — E039 Hypothyroidism, unspecified: Secondary | ICD-10-CM | POA: Diagnosis not present

## 2019-08-23 DIAGNOSIS — I129 Hypertensive chronic kidney disease with stage 1 through stage 4 chronic kidney disease, or unspecified chronic kidney disease: Secondary | ICD-10-CM | POA: Diagnosis not present

## 2019-08-23 DIAGNOSIS — Z7984 Long term (current) use of oral hypoglycemic drugs: Secondary | ICD-10-CM | POA: Diagnosis not present

## 2019-08-23 DIAGNOSIS — M1990 Unspecified osteoarthritis, unspecified site: Secondary | ICD-10-CM | POA: Insufficient documentation

## 2019-08-23 DIAGNOSIS — Z87891 Personal history of nicotine dependence: Secondary | ICD-10-CM | POA: Diagnosis not present

## 2019-08-23 DIAGNOSIS — C541 Malignant neoplasm of endometrium: Secondary | ICD-10-CM | POA: Insufficient documentation

## 2019-08-23 DIAGNOSIS — E1165 Type 2 diabetes mellitus with hyperglycemia: Secondary | ICD-10-CM | POA: Diagnosis not present

## 2019-08-23 DIAGNOSIS — K219 Gastro-esophageal reflux disease without esophagitis: Secondary | ICD-10-CM | POA: Diagnosis not present

## 2019-08-23 DIAGNOSIS — K5900 Constipation, unspecified: Secondary | ICD-10-CM | POA: Diagnosis not present

## 2019-08-23 DIAGNOSIS — E1122 Type 2 diabetes mellitus with diabetic chronic kidney disease: Secondary | ICD-10-CM | POA: Insufficient documentation

## 2019-08-23 DIAGNOSIS — Z79899 Other long term (current) drug therapy: Secondary | ICD-10-CM | POA: Insufficient documentation

## 2019-08-23 NOTE — Patient Instructions (Signed)
Preparing for your Surgery  Plan for surgery on Sep 14, 2019 with Dr. Jeral Pinch at [place] will be scheduled for a robotic assisted total laparoscopic hysterectomy, bilateral salpingo-oophorectomy, sentinel lymph node biopsy, possible lymph node dissection, possible laparotomy.   Pre-operative Testing -You will receive a phone call from presurgical testing at [facility] to arrange for a pre-operative appointment over the phone, lab appointment, and COVID test. The COVID test normally happens 3 days prior to the surgery and they ask that you self quarantine after the test up until surgery to decrease chance of exposure.  -Bring your insurance card, copy of an advanced directive if applicable, medication list  -At that visit, you will be asked to sign a consent for a possible blood transfusion in case a transfusion becomes necessary during surgery.  The need for a blood transfusion is rare but having consent is a necessary part of your care.     -You should not be taking blood thinners or aspirin at least ten days prior to surgery unless instructed by your surgeon.  -Do not take supplements such as fish oil (omega 3), red yeast rice, turmeric before your surgery.   Day Before Surgery at [place] will be asked to take in a light diet the day before surgery.  Avoid carbonated beverages.  You will be advised you can have clear liquids after midnight and up until 3 hours before your surgery.    Eat a light diet the day before surgery.  Examples including soups, broths, toast, yogurt, mashed potatoes.  Things to avoid include carbonated beverages (fizzy beverages), raw fruits and raw vegetables, or beans.   If your bowels are filled with gas, your surgeon will have difficulty visualizing your pelvic organs which increases your surgical risks.  Your role in recovery Your role is to become active as soon as directed by your doctor, while still giving yourself time to  heal.  Rest when you feel tired. You will be asked to do the following in order to speed your recovery:  - Cough and breathe deeply. This helps to clear and expand your lungs and can prevent pneumonia after surgery.  - [place]. Do mild physical activity. Walking or moving your legs help your circulation and body functions return to normal. Do not try to get up or walk alone the first time after surgery.   -If you develop swelling on one leg or the other, pain in the back of your leg, redness/warmth in one of your legs, please call the office or go to the Emergency Room to have a doppler to rule out a blood clot. For shortness of breath, chest pain-seek care in the Emergency Room as soon as possible. - Actively manage your pain. Managing your pain lets you move in comfort. We will ask you to rate your pain on a scale of zero to 10. It is your responsibility to tell your doctor or nurse where and how much you hurt so your pain can be treated.  Special Considerations -If you are diabetic, you may be placed on insulin after surgery to have closer control over your blood sugars to promote healing and recovery.  This does not mean that you will be discharged on insulin.  If applicable, your oral antidiabetics will be resumed when you are tolerating a solid diet.  -Your final pathology results from surgery should be available around one week after surgery and the results will be  relayed to you when available.  -Dr. Lahoma Crocker is the surgeon that assists your GYN Oncologist with surgery.  If you end up staying the night, the next day after your surgery you will either see Dr. Denman George, Dr. Berline Lopes, or Dr. Lahoma Crocker.  -FMLA forms can be faxed to [phone] and please allow 5-7 business days for completion.  Risks of Surgery Risks of surgery are low but include bleeding, infection, damage to surrounding structures, re-operation, blood clots, and very rarely  death.   Blood Transfusion Information (For the consent to be signed before surgery)  We will be checking your blood type before surgery so in case of emergencies, we will know what type of blood you would need.                                            WHAT IS A BLOOD TRANSFUSION?  A transfusion is the replacement of blood or some of its parts. Blood is made up of multiple cells which provide different functions.  Red blood cells carry oxygen and are used for blood loss replacement.  White blood cells fight against infection.  Platelets control bleeding.  Plasma helps clot blood.  Other blood products are available for specialized needs, such as hemophilia or other clotting disorders. BEFORE THE TRANSFUSION  Who gives blood for transfusions?   You may be able to donate blood to be used at a later date on yourself (autologous donation).  Relatives can be asked to donate blood. This is generally not any safer than if you have received blood from a stranger. The same precautions are taken to ensure safety when a relative's blood is donated.  Healthy volunteers who are fully evaluated to make sure their blood is safe. This is blood bank blood. Transfusion therapy is the safest it has ever been in the practice of medicine. Before blood is taken from a donor, a complete history is taken to make sure that person has no history of diseases nor engages in risky social behavior (examples are intravenous drug use or sexual activity with multiple partners). The donor's travel history is screened to minimize risk of transmitting infections, such as malaria. The donated blood is tested for signs of infectious diseases, such as HIV and hepatitis. The blood is then tested to be sure it is compatible with you in order to minimize the chance of a transfusion reaction. If you or a relative donates blood, this is often done in anticipation of surgery and is not appropriate for emergency situations. It  takes many days to process the donated blood. RISKS AND COMPLICATIONS Although transfusion therapy is very safe and saves many lives, the main dangers of transfusion include:   Getting an infectious disease.  Developing a transfusion reaction. This is an allergic reaction to something in the blood you were given. Every precaution is taken to prevent this. The decision to have a blood transfusion has been considered carefully by your caregiver before blood is given. Blood is not given unless the benefits outweigh the risks.  AFTER SURGERY INSTRUCTIONS  Return to work: 4-6 weeks if applicable  Activity: 1. Be up and out of the bed during the day.  Take a nap if needed.  You may walk up steps but be careful and use the hand rail.  Stair climbing will tire you more than you think, you may need  to stop part way and rest.   2. No lifting or straining for 6 weeks over 10 pounds. No pushing, pulling, straining for 6 weeks.  3. No driving for around 1 week(s) if you were cleared to drive before surgery.  Do not drive if you are taking narcotic pain medicine and make sure that your reaction time has returned.   4. You can shower as soon as the next day after surgery. Shower daily.  Use soap and water on your incision and pat dry; don't rub.  No tub baths or submerging your body in water until cleared by your surgeon. If you have the soap that was given to you by pre-surgical testing that was used before surgery, you do not need to use it afterwards because this can irritate your incisions.   5. No sexual activity and nothing in the vagina for 8 weeks.  6. You may experience a small amount of clear drainage from your incisions, which is normal.  If the drainage persists, increases, or changes color please call the office.  7. Do not use creams, lotions, or ointments such as neosporin on your incisions after surgery until advised by your surgeon because they can cause removal of the dermabond glue on  your incisions.    8. You may experience vaginal spotting after surgery or around the 6-8 week mark from surgery when the stitches at the top of the vagina begin to dissolve.  The spotting is normal but if you experience heavy bleeding, call our office.  9. Take Tylenol or ibuprofen first for pain and only use narcotic pain medication for severe pain not relieved by the Tylenol or Ibuprofen.  Monitor your Tylenol intake to a max of 4,000 mg.  Diet: 1. Low sodium Heart Healthy Diet is recommended.  2. It is safe to use a laxative, such as Miralax or Colace, if you have difficulty moving your bowels. You can take Sennakot at bedtime every evening to keep bowel movements regular and to prevent constipation.    Wound Care: 1. Keep clean and dry.  Shower daily.  Reasons to call the Doctor:  Fever - Oral temperature greater than 100.4 degrees Fahrenheit  Foul-smelling vaginal discharge  Difficulty urinating  Nausea and vomiting  Increased pain at the site of the incision that is unrelieved with pain medicine.  Difficulty breathing with or without chest pain  New calf pain especially if only on one side  Sudden, continuing increased vaginal bleeding with or without clots.   Contacts: For questions or concerns you should contact:  Dr. Jeral Pinch at [phone]  Joylene John, NP at [phone]  After Hours: call [phone] and have the GYN Oncologist paged/contacted

## 2019-09-02 NOTE — Patient Instructions (Addendum)
DUE TO COVID-19 ONLY ONE VISITOR IS ALLOWED TO COME WITH YOU AND STAY IN THE WAITING ROOM ONLY DURING PRE OP AND PROCEDURE DAY OF SURGERY. THE 2 VISITOR MAY VISIT WITH YOU AFTER SURGERY IN YOUR PRIVATE ROOM DURING VISITING HOURS ONLY! 5/21 YOU NEED TO HAVE A COVID 19 TEST ON_5/21______ @_11 :20______, THIS TEST MUST BE DONE BEFORE SURGERY, COME  [place], Cassandra Dodson , [id number].  ([place]) ONCE YOUR COVID TEST IS COMPLETED, PLEASE BEGIN THE QUARANTINE INSTRUCTIONS AS OUTLINED IN YOUR HANDOUT.                Cassandra Dodson    Your procedure is scheduled on: 09/14/19   Report to [facility] Main  Entrance   Report to Short Stay at 5:30 AM     Call this number if you have problems the morning of surgery [phone]    Remember: Do not eat food or drink liquids after Midnight  . BRUSH YOUR TEETH MORNING OF SURGERY AND RINSE YOUR MOUTH OUT, NO CHEWING GUM CANDY OR MINTS.     Take these medicines the morning of surgery with A SIP OF WATER: None  DO NOT TAKE ANY DIABETIC MEDICATIONS DAY OF YOUR SURGERY    How to Manage Your Diabetes Before and After Surgery  Why is it important to control my blood sugar before and after surgery? . Improving blood sugar levels before and after surgery helps healing and can limit problems. . A way of improving blood sugar control is eating a healthy diet by: o  Eating less sugar and carbohydrates o  Increasing activity/exercise o  Talking with your doctor about reaching your blood sugar goals . High blood sugars (greater than 180 mg/dL) can raise your risk of infections and slow your recovery, so you will need to focus on controlling your diabetes during the weeks before surgery. . Make sure that the doctor who takes care of your diabetes knows about your planned surgery including the date and location.  How do I manage my blood sugar before surgery? . Check your blood sugar at least 4 times a day, starting 2 days  before surgery, to make sure that the level is not too high or low. o Check your blood sugar the morning of your surgery when you wake up and every 2 hours until you get to the Short Stay unit. . If your blood sugar is less than 70 mg/dL, you will need to treat for low blood sugar: o Do not take insulin. o Treat a low blood sugar (less than 70 mg/dL) with  cup of clear juice (cranberry or apple), 4 glucose tablets, OR glucose gel. o Recheck blood sugar in 15 minutes after treatment (to make sure it is greater than 70 mg/dL). If your blood sugar is not greater than 70 mg/dL on recheck, call [phone] for further instructions. . Report your blood sugar to the short stay nurse when you get to Short Stay.  . If you are admitted to the hospital after surgery: o Your blood sugar will be checked by the staff and you will probably be given insulin after surgery (instead of oral diabetes medicines) to make sure you have good blood sugar levels. o The goal for blood sugar control after surgery is 80-180 mg/dL.   WHAT DO I DO ABOUT MY DIABETES MEDICATION?  Marland Kitchen Do not take oral diabetes medicines (pills) the morning of surgery.  . THE NIGHT BEFORE SURGERY, take 34  units of Humalog   insulin.       . THE MORNING OF SURGERY, take   NO insulin.                 You may not have any metal on your body including hair pins and              piercings  Do not wear jewelry, make-up, lotions, powders or perfumes, deodorant             Do not wear nail polish on your fingernails.  Do not shave  48 hours prior to surgery.     Do not bring valuables to the hospital. [place].  Contacts, dentures or bridgework may not be worn into surgery. .     Patients discharged the day of surgery will not be allowed to drive home.   IF YOU ARE HAVING SURGERY AND GOING HOME THE SAME DAY, YOU MUST HAVE AN ADULT TO DRIVE YOU HOME AND BE WITH YOU FOR 24 HOURS.   YOU MAY  GO HOME BY TAXI OR UBER OR ORTHERWISE, BUT AN ADULT MUST ACCOMPANY YOU HOME AND STAY WITH YOU FOR 24 HOURS.  Name and phone number of your driver:  Special Instructions: N/A              Please read over the following fact sheets you were given: _____________________________________________________________________             [facility] - Preparing for Surgery Before surgery, you can play an important role.   Because skin is not sterile, your skin needs to be as free of germs as possible .  You can reduce the number of germs on your skin by washing with CHG (chlorahexidine gluconate) soap before surgery.   CHG is an antiseptic cleaner which kills germs and bonds with the skin to continue killing germs even after washing. Please DO NOT use if you have an allergy to CHG or antibacterial soaps.   If your skin becomes reddened/irritated stop using the CHG and inform your nurse when you arrive at Short Stay. Do not shave (including legs and underarms) for at least 48 hours prior to the first CHG shower.  Please follow these instructions carefully:  1.  Shower with CHG Soap the night before surgery and the  morning of Surgery.  2.  If you choose to wash your hair, wash your hair first as usual with your  normal  shampoo.  3.  After you shampoo, rinse your hair and body thoroughly to remove the  shampoo.                                        4.  Use CHG as you would any other liquid soap.  You can apply chg directly  to the skin and wash                       Gently with a scrungie or clean washcloth.  5.  Apply the CHG Soap to your body ONLY FROM THE NECK DOWN.   Do not use on face/ open  Wound or open sores. Avoid contact with eyes, ears mouth and genitals (private parts).                       Wash face,  Genitals (private parts) with your normal soap.             6.  Wash thoroughly, paying special attention to the area where your surgery  will be performed.  7.   Thoroughly rinse your body with warm water from the neck down.  8.  DO NOT shower/wash with your normal soap after using and rinsing off  the CHG Soap.             9.  Pat yourself dry with a clean towel.            10.  Wear clean pajamas.            11.  Place clean sheets on your bed the night of your first shower and do not  sleep with pets. Day of Surgery : Do not apply any lotions/deodorants the morning of surgery.  Please wear clean clothes to the hospital/surgery center.  FAILURE TO FOLLOW THESE INSTRUCTIONS MAY RESULT IN THE CANCELLATION OF YOUR SURGERY PATIENT SIGNATURE_________________________________  NURSE SIGNATURE__________________________________  ________________________________________________________________________   Cassandra Dodson  An incentive spirometer is a tool that can help keep your lungs clear and active. This tool measures how well you are filling your lungs with each breath. Taking long deep breaths may help reverse or decrease the chance of developing breathing (pulmonary) problems (especially infection) following:  A long period of time when you are unable to move or be active. BEFORE THE PROCEDURE   If the spirometer includes an indicator to show your best effort, your nurse or respiratory therapist will set it to a desired goal.  If possible, sit up straight or lean slightly forward. Try not to slouch.  Hold the incentive spirometer in an upright position. INSTRUCTIONS FOR USE  1. Sit on the edge of your bed if possible, or sit up as far as you can in bed or on a chair. 2. Hold the incentive spirometer in an upright position. 3. Breathe out normally. 4. Place the mouthpiece in your mouth and seal your lips tightly around it. 5. Breathe in slowly and as deeply as possible, raising the piston or the ball toward the top of the column. 6. Hold your breath for 3-5 seconds or for as long as possible. Allow the piston or ball to fall to the bottom of  the column. 7. Remove the mouthpiece from your mouth and breathe out normally. 8. Rest for a few seconds and repeat Steps 1 through 7 at least 10 times every 1-2 hours when you are awake. Take your time and take a few normal breaths between deep breaths. 9. The spirometer may include an indicator to show your best effort. Use the indicator as a goal to work toward during each repetition. 10. After each set of 10 deep breaths, practice coughing to be sure your lungs are clear. If you have an incision (the cut made at the time of surgery), support your incision when coughing by placing a pillow or rolled up towels firmly against it. Once you are able to get out of bed, walk around indoors and cough well. You may stop using the incentive spirometer when instructed by your caregiver.  RISKS AND COMPLICATIONS  Take your time so you do not get dizzy or  light-headed.  If you are in pain, you may need to take or ask for pain medication before doing incentive spirometry. It is harder to take a deep breath if you are having pain. AFTER USE  Rest and breathe slowly and easily.  It can be helpful to keep track of a log of your progress. Your caregiver can provide you with a simple table to help with this. If you are using the spirometer at home, follow these instructions: [place] IF:   You are having difficultly using the spirometer.  You have trouble using the spirometer as often as instructed.  Your pain medication is not giving enough relief while using the spirometer.  You develop fever of 100.5 F (38.1 C) or higher. SEEK IMMEDIATE MEDICAL CARE IF:   You cough up bloody sputum that had not been present before.  You develop fever of 102 F (38.9 C) or greater.  You develop worsening pain at or near the incision site. MAKE SURE YOU:   Understand these instructions.  Will watch your condition.  Will get help right away if you are not doing well or get worse. Document  Released: 08/19/2006 Document Revised: 07/01/2011 Document Reviewed: 10/20/2006 [facility] Patient Information 2014 [place], [place].   ________________________________________________________________________

## 2019-09-03 ENCOUNTER — Encounter (HOSPITAL_COMMUNITY): Payer: Self-pay

## 2019-09-03 ENCOUNTER — Encounter (HOSPITAL_COMMUNITY)
Admission: RE | Admit: 2019-09-03 | Discharge: 2019-09-03 | Disposition: A | Discharge status: Home / Self Care | Payer: Medicare Other | Source: Ambulatory Visit | Attending: Gynecologic Oncology | Admitting: Gynecologic Oncology

## 2019-09-03 ENCOUNTER — Other Ambulatory Visit: Payer: Self-pay

## 2019-09-03 DIAGNOSIS — Z01812 Encounter for preprocedural laboratory examination: Secondary | ICD-10-CM | POA: Insufficient documentation

## 2019-09-03 LAB — COMPREHENSIVE METABOLIC PANEL
ALT: 11 U/L (ref 0–44)
AST: 14 U/L — ABNORMAL LOW (ref 15–41)
Albumin: 3.8 g/dL (ref 3.5–5.0)
Alkaline Phosphatase: 68 U/L (ref 38–126)
Anion gap: 8 (ref 5–15)
BUN: 25 mg/dL — ABNORMAL HIGH (ref 8–23)
CO2: 25 mmol/L (ref 22–32)
Calcium: 8.9 mg/dL (ref 8.9–10.3)
Chloride: 103 mmol/L (ref 98–111)
Creatinine, Ser: 1.32 mg/dL — ABNORMAL HIGH (ref 0.44–1.00)
GFR calc Af Amer: 44 mL/min — ABNORMAL LOW (ref >60)
GFR calc non Af Amer: 38 mL/min — ABNORMAL LOW (ref >60)
Glucose, Bld: 185 mg/dL — ABNORMAL HIGH (ref 70–99)
Potassium: 4 mmol/L (ref 3.5–5.1)
Sodium: 136 mmol/L (ref 135–145)
Total Bilirubin: 0.4 mg/dL (ref 0.3–1.2)
Total Protein: 6.9 g/dL (ref 6.5–8.1)

## 2019-09-03 LAB — CBC
HCT: 41.1 % (ref 36.0–46.0)
Hemoglobin: 12.4 g/dL (ref 12.0–15.0)
MCH: 26.2 pg (ref 26.0–34.0)
MCHC: 30.2 g/dL (ref 30.0–36.0)
MCV: 86.9 fL (ref 80.0–100.0)
Platelets: 184 10*3/uL (ref 150–400)
RBC: 4.73 MIL/uL (ref 3.87–5.11)
RDW: 16.4 % — ABNORMAL HIGH (ref 11.5–15.5)
WBC: 7.2 10*3/uL (ref 4.0–10.5)
nRBC: 0 % (ref 0.0–0.2)

## 2019-09-03 LAB — TYPE AND SCREEN
ABO/RH(D): O POS
Antibody Screen: NEGATIVE

## 2019-09-03 LAB — HEMOGLOBIN A1C
Hgb A1c MFr Bld: 7.9 % — ABNORMAL HIGH (ref 4.8–5.6)
Mean Plasma Glucose: 180.03 mg/dL

## 2019-09-03 NOTE — Progress Notes (Signed)
PCP - Dr. Aviva Signs Cardiologist - no  Chest x-ray - 06/11/19 EKG - 06/11/19 Stress Test - no ECHO - no Cardiac Cath - no  Sleep Study - noCPAP -   Fasting Blood Sugar - 100-200 Checks Blood Sugar _____ times a day she tests once every 2 weeks,maybe.  Blood Thinner Instructions:NA Aspirin Instructions: Last Dose:  Anesthesia review:   Patient denies shortness of breath, fever, cough and chest pain at PAT appointment yes  Patient verbalized understanding of instructions that were given to them at the PAT appointment. Patient was also instructed that they will need to review over the PAT instructions again at home before surgery. Yes   Pt said that anesthesia makes her throw up! She aspirated on the OR table at one time.  There was one surgery that she did not throw up and that was 06/2016. A Lt total knee with Dr. Erlinda Hong at [facility].  She would like that anesthesia again.

## 2019-09-10 ENCOUNTER — Other Ambulatory Visit (HOSPITAL_COMMUNITY)
Admission: RE | Admit: 2019-09-10 | Discharge: 2019-09-10 | Disposition: A | Discharge status: Home / Self Care | Payer: Medicare Other | Source: Ambulatory Visit | Attending: Gynecologic Oncology | Admitting: Gynecologic Oncology

## 2019-09-10 DIAGNOSIS — Z01812 Encounter for preprocedural laboratory examination: Secondary | ICD-10-CM | POA: Insufficient documentation

## 2019-09-10 DIAGNOSIS — Z20822 Contact with and (suspected) exposure to covid-19: Secondary | ICD-10-CM | POA: Insufficient documentation

## 2019-09-10 LAB — SARS CORONAVIRUS 2 (TAT 6-24 HRS): SARS Coronavirus 2: NEGATIVE

## 2019-09-13 ENCOUNTER — Telehealth: Payer: Self-pay

## 2019-09-13 NOTE — Telephone Encounter (Signed)
Reviewed pre -op instructions with Cassandra Dodson. She was giving appropriate answers to pertinent pre op instructions.

## 2019-09-13 NOTE — Anesthesia Preprocedure Evaluation (Addendum)
Anesthesia Evaluation  Patient identified by MRN, date of birth, ID band Patient awake    Reviewed: Allergy & Precautions, H&P , NPO status , Patient's Chart, lab work & pertinent test results  History of Anesthesia Complications (+) PONV and history of anesthetic complications  Airway Mallampati: I       Dental  (+) Edentulous Lower, Poor Dentition, Missing,    Pulmonary neg pulmonary ROS, former smoker,    Pulmonary exam normal breath sounds clear to auscultation       Cardiovascular hypertension, Pt. on medications Normal cardiovascular exam Rhythm:Regular Rate:Normal     Neuro/Psych  Headaches, PSYCHIATRIC DISORDERS Depression    GI/Hepatic Neg liver ROS, GERD  Controlled,  Endo/Other  diabetes, Insulin DependentHypothyroidism Morbid obesity  Renal/GU Renal InsufficiencyRenal disease  negative genitourinary   Musculoskeletal  (+) Arthritis , Osteoarthritis,    Abdominal (+) + obese,   Peds  Hematology negative hematology ROS (+)   Anesthesia Other Findings   Reproductive/Obstetrics negative OB ROS                           Anesthesia Physical  Anesthesia Plan  ASA: II  Anesthesia Plan: General   Post-op Pain Management:    Induction: Intravenous  PONV Risk Score and Plan: 4 or greater and Ondansetron and Diphenhydramine  Airway Management Planned: Oral ETT  Additional Equipment: None  Intra-op Plan:   Post-operative Plan: Extubation in OR  Informed Consent: I have reviewed the patients History and Physical, chart, labs and discussed the procedure including the risks, benefits and alternatives for the proposed anesthesia with the patient or authorized representative who has indicated his/her understanding and acceptance.     Dental advisory given  Plan Discussed with: CRNA  Anesthesia Plan Comments:        Anesthesia Quick Evaluation

## 2019-09-14 ENCOUNTER — Ambulatory Visit (HOSPITAL_COMMUNITY)
Admission: RE | Admit: 2019-09-14 | Discharge: 2019-09-14 | Disposition: A | Discharge status: Home / Self Care | Payer: Medicare Other | Attending: Gynecologic Oncology | Admitting: Gynecologic Oncology

## 2019-09-14 ENCOUNTER — Encounter (HOSPITAL_COMMUNITY): Payer: Self-pay | Admitting: Gynecologic Oncology

## 2019-09-14 ENCOUNTER — Ambulatory Visit (HOSPITAL_COMMUNITY): Payer: Medicare Other | Admitting: Physician Assistant

## 2019-09-14 ENCOUNTER — Other Ambulatory Visit: Payer: Self-pay

## 2019-09-14 ENCOUNTER — Encounter (HOSPITAL_COMMUNITY)
Admission: RE | Disposition: A | Discharge status: Home / Self Care | Payer: Self-pay | Source: Home / Self Care | Attending: Gynecologic Oncology

## 2019-09-14 ENCOUNTER — Ambulatory Visit (HOSPITAL_COMMUNITY): Payer: Medicare Other | Admitting: Anesthesiology

## 2019-09-14 DIAGNOSIS — C541 Malignant neoplasm of endometrium: Secondary | ICD-10-CM | POA: Insufficient documentation

## 2019-09-14 DIAGNOSIS — I129 Hypertensive chronic kidney disease with stage 1 through stage 4 chronic kidney disease, or unspecified chronic kidney disease: Secondary | ICD-10-CM | POA: Diagnosis not present

## 2019-09-14 DIAGNOSIS — E1122 Type 2 diabetes mellitus with diabetic chronic kidney disease: Secondary | ICD-10-CM | POA: Diagnosis not present

## 2019-09-14 DIAGNOSIS — R519 Headache, unspecified: Secondary | ICD-10-CM | POA: Insufficient documentation

## 2019-09-14 DIAGNOSIS — Z794 Long term (current) use of insulin: Secondary | ICD-10-CM | POA: Diagnosis not present

## 2019-09-14 DIAGNOSIS — Z8249 Family history of ischemic heart disease and other diseases of the circulatory system: Secondary | ICD-10-CM | POA: Diagnosis not present

## 2019-09-14 DIAGNOSIS — Z823 Family history of stroke: Secondary | ICD-10-CM | POA: Insufficient documentation

## 2019-09-14 DIAGNOSIS — K219 Gastro-esophageal reflux disease without esophagitis: Secondary | ICD-10-CM | POA: Diagnosis not present

## 2019-09-14 DIAGNOSIS — Z96641 Presence of right artificial hip joint: Secondary | ICD-10-CM | POA: Diagnosis not present

## 2019-09-14 DIAGNOSIS — N183 Chronic kidney disease, stage 3 unspecified: Secondary | ICD-10-CM | POA: Insufficient documentation

## 2019-09-14 DIAGNOSIS — F329 Major depressive disorder, single episode, unspecified: Secondary | ICD-10-CM | POA: Diagnosis not present

## 2019-09-14 DIAGNOSIS — E039 Hypothyroidism, unspecified: Secondary | ICD-10-CM | POA: Insufficient documentation

## 2019-09-14 DIAGNOSIS — M1990 Unspecified osteoarthritis, unspecified site: Secondary | ICD-10-CM | POA: Diagnosis not present

## 2019-09-14 DIAGNOSIS — Z800 Family history of malignant neoplasm of digestive organs: Secondary | ICD-10-CM | POA: Diagnosis not present

## 2019-09-14 DIAGNOSIS — K5900 Constipation, unspecified: Secondary | ICD-10-CM | POA: Diagnosis not present

## 2019-09-14 DIAGNOSIS — Z96652 Presence of left artificial knee joint: Secondary | ICD-10-CM | POA: Diagnosis not present

## 2019-09-14 DIAGNOSIS — Z87891 Personal history of nicotine dependence: Secondary | ICD-10-CM | POA: Insufficient documentation

## 2019-09-14 DIAGNOSIS — E1140 Type 2 diabetes mellitus with diabetic neuropathy, unspecified: Secondary | ICD-10-CM | POA: Insufficient documentation

## 2019-09-14 DIAGNOSIS — Z6832 Body mass index (BMI) 32.0-32.9, adult: Secondary | ICD-10-CM | POA: Diagnosis not present

## 2019-09-14 DIAGNOSIS — N83202 Unspecified ovarian cyst, left side: Secondary | ICD-10-CM | POA: Diagnosis not present

## 2019-09-14 DIAGNOSIS — Z79899 Other long term (current) drug therapy: Secondary | ICD-10-CM | POA: Diagnosis not present

## 2019-09-14 HISTORY — PX: ROBOTIC ASSISTED TOTAL HYSTERECTOMY WITH BILATERAL SALPINGO OOPHERECTOMY: SHX6086

## 2019-09-14 HISTORY — PX: LYMPH NODE DISSECTION: SHX5087

## 2019-09-14 HISTORY — PX: SENTINEL NODE BIOPSY: SHX6608

## 2019-09-14 LAB — URINALYSIS, ROUTINE W REFLEX MICROSCOPIC
Bilirubin Urine: NEGATIVE
Glucose, UA: 150 mg/dL — AB
Ketones, ur: NEGATIVE mg/dL
Leukocytes,Ua: NEGATIVE
Nitrite: NEGATIVE
Protein, ur: NEGATIVE mg/dL
Specific Gravity, Urine: 1.006 (ref 1.005–1.030)
pH: 7 (ref 5.0–8.0)

## 2019-09-14 LAB — GLUCOSE, CAPILLARY
Glucose-Capillary: 210 mg/dL — ABNORMAL HIGH (ref 70–99)
Glucose-Capillary: 240 mg/dL — ABNORMAL HIGH (ref 70–99)

## 2019-09-14 SURGERY — HYSTERECTOMY, TOTAL, ROBOT-ASSISTED, LAPAROSCOPIC, WITH BILATERAL SALPINGO-OOPHORECTOMY
Anesthesia: General

## 2019-09-14 MED ORDER — SUGAMMADEX SODIUM 200 MG/2ML IV SOLN
INTRAVENOUS | Status: DC | PRN
  Administered 2019-09-14: 200 mg via INTRAVENOUS

## 2019-09-14 MED ORDER — DEXAMETHASONE SODIUM PHOSPHATE 10 MG/ML IJ SOLN
INTRAMUSCULAR | Status: AC
  Filled 2019-09-14: qty 1

## 2019-09-14 MED ORDER — HYDROMORPHONE HCL 1 MG/ML IJ SOLN
INTRAMUSCULAR | Status: AC
  Filled 2019-09-14: qty 1

## 2019-09-14 MED ORDER — EPHEDRINE SULFATE-NACL 50-0.9 MG/10ML-% IV SOSY
PREFILLED_SYRINGE | INTRAVENOUS | Status: DC | PRN
  Administered 2019-09-14: 10 mg via INTRAVENOUS

## 2019-09-14 MED ORDER — MEPERIDINE HCL 50 MG/ML IJ SOLN: 6.2500 mg | INTRAMUSCULAR | Status: DC | PRN

## 2019-09-14 MED ORDER — STERILE WATER FOR IRRIGATION IR SOLN
Status: DC | PRN
  Administered 2019-09-14: 1000 mL

## 2019-09-14 MED ORDER — HYDRALAZINE HCL 20 MG/ML IJ SOLN
5.0000 mg | Freq: Once | INTRAMUSCULAR | Status: AC
  Administered 2019-09-14: 5 mg via INTRAVENOUS

## 2019-09-14 MED ORDER — LACTATED RINGERS IV SOLN: INTRAVENOUS | Status: DC

## 2019-09-14 MED ORDER — HEPARIN SODIUM (PORCINE) 5000 UNIT/ML IJ SOLN
5000.0000 [IU] | INTRAMUSCULAR | Status: AC
  Administered 2019-09-14: 5000 [IU] via SUBCUTANEOUS
  Filled 2019-09-14: qty 1

## 2019-09-14 MED ORDER — PROMETHAZINE HCL 25 MG/ML IJ SOLN: 6.2500 mg | INTRAMUSCULAR | Status: DC | PRN

## 2019-09-14 MED ORDER — INSULIN ASPART PROT & ASPART (70-30 MIX) 100 UNIT/ML ~~LOC~~ SUSP
20.0000 [IU] | SUBCUTANEOUS | Status: AC
  Administered 2019-09-14: 20 [IU] via SUBCUTANEOUS
  Filled 2019-09-14: qty 10

## 2019-09-14 MED ORDER — ONDANSETRON HCL 4 MG/2ML IJ SOLN
INTRAMUSCULAR | Status: DC | PRN
  Administered 2019-09-14: 4 mg via INTRAVENOUS

## 2019-09-14 MED ORDER — LIDOCAINE HCL 2 % IJ SOLN
INTRAMUSCULAR | Status: AC
  Filled 2019-09-14: qty 20

## 2019-09-14 MED ORDER — HYDROMORPHONE HCL 1 MG/ML IJ SOLN
0.2500 mg | INTRAMUSCULAR | Status: DC | PRN
  Administered 2019-09-14: 0.25 mg via INTRAVENOUS
  Administered 2019-09-14: 0.5 mg via INTRAVENOUS
  Administered 2019-09-14: 0.25 mg via INTRAVENOUS

## 2019-09-14 MED ORDER — ACETAMINOPHEN 10 MG/ML IV SOLN: 1000.0000 mg | Freq: Once | INTRAVENOUS | Status: DC | PRN

## 2019-09-14 MED ORDER — PHENYLEPHRINE 40 MCG/ML (10ML) SYRINGE FOR IV PUSH (FOR BLOOD PRESSURE SUPPORT)
PREFILLED_SYRINGE | INTRAVENOUS | Status: DC | PRN
  Administered 2019-09-14: 80 ug via INTRAVENOUS

## 2019-09-14 MED ORDER — FENTANYL CITRATE (PF) 100 MCG/2ML IJ SOLN
INTRAMUSCULAR | Status: AC
  Filled 2019-09-14: qty 2

## 2019-09-14 MED ORDER — DIPHENHYDRAMINE HCL 50 MG/ML IJ SOLN
INTRAMUSCULAR | Status: AC
  Filled 2019-09-14: qty 1

## 2019-09-14 MED ORDER — ROCURONIUM BROMIDE 10 MG/ML (PF) SYRINGE
PREFILLED_SYRINGE | INTRAVENOUS | Status: DC | PRN
  Administered 2019-09-14: 60 mg via INTRAVENOUS
  Administered 2019-09-14: 20 mg via INTRAVENOUS

## 2019-09-14 MED ORDER — BUPIVACAINE HCL 0.25 % IJ SOLN
INTRAMUSCULAR | Status: AC
  Filled 2019-09-14: qty 1

## 2019-09-14 MED ORDER — TRAMADOL HCL 50 MG PO TABS: 50.0000 mg | ORAL_TABLET | Freq: Four times a day (QID) | ORAL | 0 refills | Status: DC | PRN

## 2019-09-14 MED ORDER — CEFAZOLIN SODIUM-DEXTROSE 2-4 GM/100ML-% IV SOLN
2.0000 g | INTRAVENOUS | Status: AC
  Administered 2019-09-14: 2 g via INTRAVENOUS
  Filled 2019-09-14: qty 100

## 2019-09-14 MED ORDER — LACTATED RINGERS IR SOLN
Status: DC | PRN
  Administered 2019-09-14: 1000 mL

## 2019-09-14 MED ORDER — PROPOFOL 10 MG/ML IV BOLUS
INTRAVENOUS | Status: AC
  Filled 2019-09-14: qty 20

## 2019-09-14 MED ORDER — LABETALOL HCL 5 MG/ML IV SOLN
INTRAVENOUS | Status: DC | PRN
  Administered 2019-09-14: 5 mg via INTRAVENOUS

## 2019-09-14 MED ORDER — LABETALOL HCL 5 MG/ML IV SOLN
INTRAVENOUS | Status: AC
  Filled 2019-09-14: qty 4

## 2019-09-14 MED ORDER — LIDOCAINE 2% (20 MG/ML) 5 ML SYRINGE
INTRAMUSCULAR | Status: AC
  Filled 2019-09-14: qty 5

## 2019-09-14 MED ORDER — PROPOFOL 10 MG/ML IV BOLUS
INTRAVENOUS | Status: DC | PRN
  Administered 2019-09-14: 100 mg via INTRAVENOUS

## 2019-09-14 MED ORDER — KETAMINE HCL 10 MG/ML IJ SOLN
INTRAMUSCULAR | Status: DC | PRN
  Administered 2019-09-14: 50 mg via INTRAVENOUS
  Administered 2019-09-14: 20 mg via INTRAVENOUS

## 2019-09-14 MED ORDER — BUPIVACAINE HCL 0.25 % IJ SOLN
INTRAMUSCULAR | Status: DC | PRN
  Administered 2019-09-14: 46 mL

## 2019-09-14 MED ORDER — STERILE WATER FOR INJECTION IJ SOLN
INTRAMUSCULAR | Status: DC | PRN
  Administered 2019-09-14: 10 mL

## 2019-09-14 MED ORDER — ACETAMINOPHEN 500 MG PO TABS
1000.0000 mg | ORAL_TABLET | ORAL | Status: AC
  Administered 2019-09-14: 1000 mg via ORAL
  Filled 2019-09-14: qty 2

## 2019-09-14 MED ORDER — DIPHENHYDRAMINE HCL 50 MG/ML IJ SOLN
INTRAMUSCULAR | Status: DC | PRN
  Administered 2019-09-14: 25 mg via INTRAVENOUS

## 2019-09-14 MED ORDER — ROCURONIUM BROMIDE 10 MG/ML (PF) SYRINGE
PREFILLED_SYRINGE | INTRAVENOUS | Status: AC
  Filled 2019-09-14: qty 10

## 2019-09-14 MED ORDER — SODIUM CHLORIDE 0.9% FLUSH: 3.0000 mL | Freq: Two times a day (BID) | INTRAVENOUS | Status: DC

## 2019-09-14 MED ORDER — HYDRALAZINE HCL 20 MG/ML IJ SOLN
INTRAMUSCULAR | Status: AC
  Filled 2019-09-14: qty 1

## 2019-09-14 MED ORDER — KETAMINE HCL 10 MG/ML IJ SOLN
INTRAMUSCULAR | Status: AC
  Filled 2019-09-14: qty 1

## 2019-09-14 MED ORDER — DEXAMETHASONE SODIUM PHOSPHATE 10 MG/ML IJ SOLN
INTRAMUSCULAR | Status: DC | PRN
  Administered 2019-09-14: 10 mg via INTRAVENOUS

## 2019-09-14 MED ORDER — LABETALOL HCL 5 MG/ML IV SOLN
10.0000 mg | Freq: Once | INTRAVENOUS | Status: AC
  Administered 2019-09-14: 10 mg via INTRAVENOUS

## 2019-09-14 MED ORDER — FENTANYL CITRATE (PF) 100 MCG/2ML IJ SOLN
INTRAMUSCULAR | Status: DC | PRN
  Administered 2019-09-14 (×2): 100 ug via INTRAVENOUS

## 2019-09-14 MED ORDER — STERILE WATER FOR INJECTION IJ SOLN
INTRAMUSCULAR | Status: AC
  Filled 2019-09-14: qty 10

## 2019-09-14 MED ORDER — ONDANSETRON HCL 4 MG/2ML IJ SOLN
INTRAMUSCULAR | Status: AC
  Filled 2019-09-14: qty 2

## 2019-09-14 SURGICAL SUPPLY — 75 items
ADH SKN CLS APL DERMABOND .7 (GAUZE/BANDAGES/DRESSINGS) ×2
AGENT HMST KT MTR STRL THRMB (HEMOSTASIS)
APL ESCP 34 STRL LF DISP (HEMOSTASIS)
APPLICATOR SURGIFLO ENDO (HEMOSTASIS) IMPLANT
BACTOSHIELD CHG 4% 4OZ (MISCELLANEOUS) ×1
BAG LAPAROSCOPIC 12 15 PORT 16 (BASKET) IMPLANT
BAG RETRIEVAL 12/15 (BASKET)
BAG SPEC RTRVL LRG 6X4 10 (ENDOMECHANICALS)
BLADE SURG SZ10 CARB STEEL (BLADE) IMPLANT
CNTNR URN SCR LID CUP LEK RST (MISCELLANEOUS) IMPLANT
CONT SPEC 4OZ STRL OR WHT (MISCELLANEOUS) ×3
COVER BACK TABLE 60X90IN (DRAPES) ×3 IMPLANT
COVER TIP SHEARS 8 DVNC (MISCELLANEOUS) ×2 IMPLANT
COVER TIP SHEARS 8MM DA VINCI (MISCELLANEOUS) ×3
COVER WAND RF STERILE (DRAPES) IMPLANT
DECANTER SPIKE VIAL GLASS SM (MISCELLANEOUS) ×2 IMPLANT
DERMABOND ADVANCED (GAUZE/BANDAGES/DRESSINGS) ×1
DERMABOND ADVANCED .7 DNX12 (GAUZE/BANDAGES/DRESSINGS) ×2 IMPLANT
DRAPE ARM DVNC X/XI (DISPOSABLE) ×8 IMPLANT
DRAPE COLUMN DVNC XI (DISPOSABLE) ×2 IMPLANT
DRAPE DA VINCI XI ARM (DISPOSABLE) ×12
DRAPE DA VINCI XI COLUMN (DISPOSABLE) ×3
DRAPE SHEET LG 3/4 BI-LAMINATE (DRAPES) ×3 IMPLANT
DRAPE SURG IRRIG POUCH 19X23 (DRAPES) ×3 IMPLANT
DRSG OPSITE POSTOP 4X6 (GAUZE/BANDAGES/DRESSINGS) IMPLANT
DRSG OPSITE POSTOP 4X8 (GAUZE/BANDAGES/DRESSINGS) IMPLANT
ELECT REM PT RETURN 15FT ADLT (MISCELLANEOUS) ×3 IMPLANT
GLOVE BIO SURGEON STRL SZ 6 (GLOVE) ×12 IMPLANT
GLOVE BIO SURGEON STRL SZ 6.5 (GLOVE) ×6 IMPLANT
GOWN STRL REUS W/ TWL LRG LVL3 (GOWN DISPOSABLE) ×8 IMPLANT
GOWN STRL REUS W/TWL LRG LVL3 (GOWN DISPOSABLE) ×12
HOLDER FOLEY CATH W/STRAP (MISCELLANEOUS) ×3 IMPLANT
IRRIG SUCT STRYKERFLOW 2 WTIP (MISCELLANEOUS) ×3
IRRIGATION SUCT STRKRFLW 2 WTP (MISCELLANEOUS) ×2 IMPLANT
KIT PROCEDURE DA VINCI SI (MISCELLANEOUS)
KIT PROCEDURE DVNC SI (MISCELLANEOUS) IMPLANT
KIT TURNOVER KIT A (KITS) IMPLANT
MANIPULATOR UTERINE 4.5 ZUMI (MISCELLANEOUS) ×3 IMPLANT
NDL HYPO 21X1.5 SAFETY (NEEDLE) ×2 IMPLANT
NDL SPNL 18GX3.5 QUINCKE PK (NEEDLE) IMPLANT
NEEDLE HYPO 21X1.5 SAFETY (NEEDLE) ×3 IMPLANT
NEEDLE SPNL 18GX3.5 QUINCKE PK (NEEDLE) ×3 IMPLANT
OBTURATOR OPTICAL STANDARD 8MM (TROCAR) ×3
OBTURATOR OPTICAL STND 8 DVNC (TROCAR) ×2
OBTURATOR OPTICALSTD 8 DVNC (TROCAR) ×2 IMPLANT
PACK ROBOT GYN WLCUSTOM (TRAY / TRAY PROCEDURE) ×3 IMPLANT
PAD POSITIONING PINK XL (MISCELLANEOUS) ×3 IMPLANT
PENCIL SMOKE EVACUATOR (MISCELLANEOUS) IMPLANT
PORT ACCESS TROCAR AIRSEAL 12 (TROCAR) ×2 IMPLANT
PORT ACCESS TROCAR AIRSEAL 5M (TROCAR) ×1
POUCH SPECIMEN RETRIEVAL 10MM (ENDOMECHANICALS) IMPLANT
SCRUB CHG 4% DYNA-HEX 4OZ (MISCELLANEOUS) ×2 IMPLANT
SEAL CANN UNIV 5-8 DVNC XI (MISCELLANEOUS) ×6 IMPLANT
SEAL XI 5MM-8MM UNIVERSAL (MISCELLANEOUS) ×9
SET TRI-LUMEN FLTR TB AIRSEAL (TUBING) ×3 IMPLANT
SPONGE LAP 18X18 RF (DISPOSABLE) IMPLANT
SURGIFLO W/THROMBIN 8M KIT (HEMOSTASIS) IMPLANT
SUT MNCRL AB 4-0 PS2 18 (SUTURE) IMPLANT
SUT PDS AB 1 TP1 96 (SUTURE) IMPLANT
SUT VIC AB 0 CT1 27 (SUTURE)
SUT VIC AB 0 CT1 27XBRD ANTBC (SUTURE) IMPLANT
SUT VIC AB 2-0 CT1 27 (SUTURE)
SUT VIC AB 2-0 CT1 TAPERPNT 27 (SUTURE) IMPLANT
SUT VIC AB 2-0 SH 27 (SUTURE) ×3
SUT VIC AB 2-0 SH 27X BRD (SUTURE) IMPLANT
SUT VICRYL 4-0 PS2 18IN ABS (SUTURE) ×6 IMPLANT
SYR 10ML LL (SYRINGE) ×1 IMPLANT
SYR 20ML LL LF (SYRINGE) ×1 IMPLANT
TOWEL OR NON WOVEN STRL DISP B (DISPOSABLE) ×3 IMPLANT
TRAP SPECIMEN MUCUS 40CC (MISCELLANEOUS) IMPLANT
TRAY FOLEY MTR SLVR 16FR STAT (SET/KITS/TRAYS/PACK) ×3 IMPLANT
TROCAR XCEL NON-BLD 5MMX100MML (ENDOMECHANICALS) IMPLANT
UNDERPAD 30X36 HEAVY ABSORB (UNDERPADS AND DIAPERS) ×3 IMPLANT
WATER STERILE IRR 1000ML POUR (IV SOLUTION) ×3 IMPLANT
YANKAUER SUCT BULB TIP 10FT TU (MISCELLANEOUS) IMPLANT

## 2019-09-14 NOTE — Anesthesia Procedure Notes (Signed)
Procedure Name: Intubation Date/Time: 09/14/2019 7:31 AM Performed by: Gerald Leitz, CRNA Pre-anesthesia Checklist: Patient identified, Patient being monitored, Timeout performed, Emergency Drugs available and Suction available Patient Re-evaluated:Patient Re-evaluated prior to induction Oxygen Delivery Method: Circle system utilized Preoxygenation: Pre-oxygenation with 100% oxygen Induction Type: IV induction Ventilation: Mask ventilation without difficulty Laryngoscope Size: Mac and 3 Grade View: Grade I Tube type: Oral Tube size: 7.5 mm Number of attempts: 2 (Attempt 1 was permitted to Paramedic student with supervision from MDA/CRNA at bedside-unsuccessful. CRNA successful upon 2nd attempt-easy airway.(dentition noted)) Airway Equipment and Method: Stylet Placement Confirmation: ETT inserted through vocal cords under direct vision,  positive ETCO2 and breath sounds checked- equal and bilateral Secured at: 21 cm Tube secured with: Tape Dental Injury: Teeth and Oropharynx as per pre-operative assessment

## 2019-09-14 NOTE — Discharge Instructions (Signed)
09/14/2019  Activity: 1. Be up and out of the bed during the day.  Take a nap if needed.  You may walk up steps but be careful and use the hand rail.  Stair climbing will tire you more than you think, you may need to stop part way and rest.   2. No lifting or straining for 6 weeks.  3. No driving for at least 1-2 weeks.  Do Not drive if you are taking narcotic pain medicine and until you can brake safely.  4. Shower daily.  Use soap and water on your incision and pat dry; don't rub.   5. No sexual activity and nothing in the vagina for 8-10 weeks.  Medications:  - Take ibuprofen and tylenol first line for pain control. Take these regularly (every 6 hours) to decrease the build up of pain.  - If necessary, for severe pain not relieved by ibuprofen, take percocet.  - While taking percocet you should take sennakot every night to reduce the likelihood of constipation. If this causes diarrhea, stop its use.  Diet: 1. Low sodium Heart Healthy Diet is recommended.  2. It is safe to use a laxative if you have difficulty moving your bowels.   Wound Care: 1. Keep clean and dry.  Shower daily.  Reasons to call the Doctor:   Fever - Oral temperature greater than 100.4 degrees Fahrenheit  Foul-smelling vaginal discharge  Difficulty urinating  Nausea and vomiting  Increased pain at the site of the incision that is unrelieved with pain medicine.  Difficulty breathing with or without chest pain  New calf pain especially if only on one side  Sudden, continuing increased vaginal bleeding with or without clots.   Follow-up: 1. See Jeral Pinch in 3 weeks.  Contacts: For questions or concerns you should contact:  Dr. Jeral Pinch at [phone] After hours and on week-ends call [phone] and ask to speak to the physician on call for Gynecologic Oncology    Hysterectomy Information A hysterectomy is a surgery to remove your uterus. After surgery, you will no longer  have periods. Also, you will not be able to get pregnant. Reasons for this surgery  You have bleeding that is not normal and keeps coming back.  You have lasting (chronic) lower belly (pelvic) pain.  You have a lasting infection.  The lining of your uterus grows outside your uterus.  The lining of your uterus grows in the muscle of your uterus.  Your uterus falls down into your vagina.  You have a growth in your uterus that causes problems.  You have cells that could turn into cancer (precancerous cells).  You have cancer of the uterus or cervix. Types There are 3 types of hysterectomies. Depending on the type, the surgery will:  Remove the top part of the uterus only.  Remove the uterus and the cervix.  Remove the uterus, cervix, and tissue that holds the uterus in place in the lower belly. Ways a hysterectomy can be performed There are 5 ways this surgery can be performed.  A cut (incision) is made in the belly (abdomen). The uterus is taken out through the cut.  A cut is made in the vagina. The uterus is taken out through the cut.  Three or four cuts are made in the belly. A surgical device with a camera is put through one of the cuts. The uterus is cut into small pieces. The uterus is taken out through the cuts or the vagina.  Three or four cuts are made in the belly. A surgical device with a camera is put through one of the cuts. The uterus is taken out through the vagina.  Three or four cuts are made in the belly. A surgical device that is controlled by a computer makes a visual image. The device helps the surgeon control the surgical tools. The uterus is cut into small pieces. The pieces are taken out through the cuts or through the vagina. What can I expect after the surgery?  You will be given pain medicine.  You will need help at home for 3-5 days after surgery.  You will need to see your doctor in 2-4 weeks after surgery.  You may get hot flashes, have night  sweats, and have trouble sleeping.  You may need to have Pap tests in the future if your surgery was related to cancer. Talk to your doctor. It is still good to have regular exams. This information is not intended to replace advice given to you by your health care provider. Make sure you discuss any questions you have with your health care provider. Document Released: 07/01/2011 Document Revised: 09/14/2015 Document Reviewed: 12/14/2012 Elsevier Interactive Patient Education  2017 [employer].

## 2019-09-14 NOTE — Interval H&P Note (Signed)
History and Physical Interval Note:  09/14/2019 6:59 AM  Cassandra Dodson  has presented today for surgery, with the diagnosis of ENDOMETRIAL CANCER.  The various methods of treatment have been discussed with the patient and family. After consideration of risks, benefits and other options for treatment, the patient has consented to  Procedure(s): XI ROBOTIC ASSISTED TOTAL HYSTERECTOMY WITH BILATERAL SALPINGO OOPHORECTOMY; POSSIBLE LAPAROTOMY (Bilateral) SENTINEL NODE BIOPSY (N/A) LYMPH NODE DISSECTION (N/A) as a surgical intervention.  The patient's history has been reviewed, patient examined, no change in status, stable for surgery.  I have reviewed the patient's chart and labs.  Questions were answered to the patient's satisfaction.     Cassandra Dodson

## 2019-09-14 NOTE — Transfer of Care (Signed)
Immediate Anesthesia Transfer of Care Note  Patient: Cassandra Dodson  Procedure(s) Performed: Procedure(s): XI ROBOTIC ASSISTED TOTAL HYSTERECTOMY WITH BILATERAL SALPINGO OOPHORECTOMY (Bilateral) SENTINEL NODE BIOPSY (N/A) LYMPH NODE DISSECTION (N/A)  Patient Location: PACU  Anesthesia Type:General  Level of Consciousness: Alert, Awake, Oriented  Airway & Oxygen Therapy: Patient Spontanous Breathing  Post-op Assessment: Report given to RN  Post vital signs: Reviewed and stable  Last Vitals:  Vitals:   09/14/19 0619  BP: 129/75  Pulse: 95  Resp: 18  Temp: 37.1 C  SpO2: [id number]    Complications: No apparent anesthesia complications

## 2019-09-14 NOTE — Op Note (Signed)
OPERATIVE NOTE  Pre-operative Diagnosis: endometrial cancer grade 2  Post-operative Diagnosis: same  Operation: Robotic-assisted laparoscopic total hysterectomy with bilateral salpingoophorectomy, SLN biopsy   Surgeon: Jeral Pinch MD  Assistant Surgeon: Lahoma Crocker MD (an MD assistant was necessary for tissue manipulation, management of robotic instrumentation, retraction and positioning due to the complexity of the case and hospital policies).   Anesthesia: GET  Urine Output: 300cc  Operative Findings: On EUA, small mobile uterus, narrow introitus. ON intra-abdominal entry, normal upper abdominal survey. Normal appearing omentum, small and large bowel. Uterus 6-8 cm and normal appearing. Bilateral adnexa normal appearing with 2cm left para-ovarian hemorrhagic appearing cyst. Some retroperitoneal fibrosis on the left. Adhesions between the bladder and cervix. Mapping successful bilaterally: on the left, to deep obturator SLN. On right, mapping along two channels in the pelvic basins with no lymph nodes identified. Two channels ultimately terminated in a low right para-aortic lymph node although the node itself was not green.  Estimated Blood Loss:  75cc      Total IV Fluids: 1,000 ml         Specimens: uterus, cervix, bilateral tubes and ovaries, bilateral SLNs (right P-A and left deep obturator)         Complications:  None apparnet; patient tolerated the procedure well.         Disposition: PACU - hemodynamically stable.  Procedure Details  The patient was seen in the Holding Room. The risks, benefits, complications, treatment options, and expected outcomes were discussed with the patient.  The patient concurred with the proposed plan, giving informed consent.  The site of surgery properly noted/marked. The patient was identified as Cassandra Dodson and the procedure verified as a Robotic-assisted hysterectomy with bilateral salpingo oophorectomy with SLN biopsy.   After  induction of anesthesia, the patient was draped and prepped in the usual sterile manner. Patient was placed in supine position after anesthesia and draped and prepped in the usual sterile manner as follows: Her arms were tucked to her side with all appropriate precautions.  The shoulders were stabilized with padded shoulder blocks applied to the acromium processes.  The patient was placed in the semi-lithotomy position in Central.  The perineum and vagina were prepped with CholoraPrep. The patient was draped after the CholoraPrep had been allowed to dry for 3 minutes.  A Time Out was held and the above information confirmed.  The urethra was prepped with Betadine. Foley catheter was placed.  A sterile speculum was placed in the vagina.  The cervix was grasped with a single-tooth tenaculum. 2mg  total of ICG was injected into the cervical stroma at 2 and 9 o'clock with 1cc injected at a 1cm and 57mm depth (concentration 0.5mg /ml) in all locations. The cervix was dilated with Kennon Rounds dilators.  The ZUMI uterine manipulator with a small colpotomizer ring was placed without difficulty after a small episiotomy was created sharply.  OG tube placement was confirmed and to suction.   Next, a 10 mm skin incision was made 1 cm below the subcostal margin in the midclavicular line.  The 5 mm Optiview port and scope was used for direct entry.  Opening pressure was under 10 mm CO2.  The abdomen was insufflated and the findings were noted as above.   At this point and all points during the procedure, the patient's intra-abdominal pressure did not exceed 15 mmHg. Next, an 8 mm skin incision was made superior to the umbilicus and a right and left port were placed about 8  cm lateral to the robot port on the right and left side.  A fourth arm was placed on the right.  The 5 mm assist trocar was exchanged for a 10-12 mm port. All ports were placed under direct visualization.  The patient was placed in steep Trendelenburg.  Bowel  was folded away into the upper abdomen.  The robot was docked in the normal manner.  The right and left peritoneum were opened parallel to the IP ligament to open the retroperitoneal spaces bilaterally. The round ligaments were transected. The SLN mapping was performed in bilateral pelvic basins. After identifying the ureters, the para rectal and paravesical spaces were opened up entirely with careful dissection below the level of the ureters bilaterally and to the depth of the uterine artery origin in order to skeletonize the uterine "web" and ensure visualization of all parametrial channels. The para-aortic basins were carefully exposed and evaluated for isolated para-aortic SLN's. Lymphatic channels were identified travelling to the following visualized sentinel lymph node's: left deep obturator (inferior to the obturator nerve) and right para-aortic. These SLN's were separated from their surrounding lymphatic tissue, removed and sent for permanent pathology.  The hysterectomy was started.  The ureter was again noted to be on the medial leaf of the broad ligament.  The peritoneum above the ureter was incised and stretched and the infundibulopelvic ligament was skeletonized, cauterized and cut.  The posterior peritoneum was taken down to the level of the KOH ring.  The anterior peritoneum was also taken down.  The bladder flap was created to the level of the KOH ring.  The uterine artery on the right side was skeletonized, cauterized and cut in the normal manner.  A similar procedure was performed on the left.  The colpotomy was made and the uterus, cervix, bilateral ovaries and tubes were amputated and delivered through the vagina.  Pedicles were inspected and excellent hemostasis was achieved.    The colpotomy at the vaginal cuff was closed with Vicryl on a CT1 needle in a running manner.  Irrigation was used and excellent hemostasis was achieved.  At this point in the procedure was completed.  Robotic  instruments were removed under direct visulaization.  The robot was undocked. The fascia at the 10-12 mm port was closed with 0 Vicryl on a UR-5 needle.  The subcuticular tissue was closed with 4-0 Vicryl and the skin was closed with 4-0 Monocryl in a subcuticular manner.  Dermabond was applied.    The vagina was swabbed with  minimal bleeding noted.  A 2-0 Vicryl running stitch was used to achieve hemostasis of the episiotomy site. All sponge, lap and needle counts were correct x 3. Foley catheter was removed.  The patient was transferred to the recovery room in stable condition.  Jeral Pinch, MD

## 2019-09-15 ENCOUNTER — Telehealth: Payer: Self-pay

## 2019-09-15 ENCOUNTER — Encounter: Payer: Self-pay | Admitting: *Deleted

## 2019-09-15 LAB — URINE CULTURE: Culture: <10000 — AB

## 2019-09-15 NOTE — Telephone Encounter (Signed)
[name] states that she is drinking fluids well.She does not have much of an appetite yet.  Encouraged her to start off with some toast and tea. She can have soups.  She does have some chicken salad in the fridge that she loves. Told her maybe she will enjoy some for lunch. She is having some pain with urination. Told her that this should improve as the day progresses. Encouraged her to push fluids. Take in at least 64 oz a day. Told her to call if the burning does not improve by tomorrow as she may need to come in to give a urine specimen to make sure she does not have a UTI. She is passing gas. Told her to take the senokot-s bid to help get her bowels to move. Pt. Has taken a tramadol 50 mg 2 hours ago and an extra strength tylenol ~1 hour ago.Her pain is currently 5/10. Reviewed wiith Melissa Cross,NP. Pt cannot use ibuprofen due to kidney function. Pt can take another tramadol now and keep using 1-2 extra strength tylenol q 6 hrs with tramadol as needed. Pt verbalized understanding. Afebrile. Incisions are D&I. Pt with some bloody vaginal discharge. Told her that this normal for a few days after her surgery. She is aware of her post op appointments and the offiice number [phone] is she has any questions or concerns.

## 2019-09-16 ENCOUNTER — Telehealth: Payer: Self-pay

## 2019-09-16 NOTE — Telephone Encounter (Signed)
Spoke with patient concerning burning with urination.  Patient states "the burning has gotten better".  She had c/o pain at incision sites but is taking her Tramadol and Tylenol as directed and these meds do help with pain.  Just c/o feeling sore at incision sites.  Patient feels that she does not need to come in at this time but will let us know if anything changes.

## 2019-09-17 ENCOUNTER — Encounter: Payer: Self-pay | Admitting: Gynecologic Oncology

## 2019-09-17 ENCOUNTER — Inpatient Hospital Stay (HOSPITAL_BASED_OUTPATIENT_CLINIC_OR_DEPARTMENT_OTHER): Payer: Medicare Other | Admitting: Gynecologic Oncology

## 2019-09-17 DIAGNOSIS — C541 Malignant neoplasm of endometrium: Secondary | ICD-10-CM

## 2019-09-17 LAB — SURGICAL PATHOLOGY

## 2019-09-17 NOTE — Anesthesia Postprocedure Evaluation (Signed)
Anesthesia Post Note  Patient: Cassandra Dodson  Procedure(s) Performed: XI ROBOTIC ASSISTED TOTAL HYSTERECTOMY WITH BILATERAL SALPINGO OOPHORECTOMY (Bilateral ) SENTINEL NODE BIOPSY (N/A ) LYMPH NODE DISSECTION (N/A )     Patient location during evaluation: PACU Anesthesia Type: General Level of consciousness: awake Pain management: pain level controlled Respiratory status: spontaneous breathing Cardiovascular status: blood pressure returned to baseline Postop Assessment: no apparent nausea or vomiting Anesthetic complications: no    Last Vitals:  Vitals:   09/14/19 1530 09/14/19 1545  BP: (!) 171/75 (!) 169/69  Pulse: 70 71  Resp: 19 (!) 21  Temp:  37 C  SpO2: 96% 94%    Last Pain:  Vitals:   09/15/19 1001  TempSrc:   PainSc: 4    Pain Goal:                   Huston Foley

## 2019-09-17 NOTE — Progress Notes (Signed)
Gynecologic Oncology Telehealth Consult Note: Gyn-Onc  I connected with [name] on 09/17/19 at 11:15 AM EDT by telephone and verified that I am speaking with the correct person using two identifiers.  I discussed the limitations, risks, security and privacy concerns of performing an evaluation and management service by telemedicine and the availability of in-person appointments. I also discussed with the patient that there may be a patient responsible charge related to this service. The patient expressed understanding and agreed to proceed.  Other persons participating in the visit and their role in the encounter: none.  Patient's location: home Provider's location: [facility]  Reason for Visit: pathology review and treatment discussion  Treatment History: Oncology History Overview Note  Surgery initially scheduled for early March 2021, delayed given uncontrolled diabetes. Mirena IUD placed instead   Endometrial cancer ([place])  03/30/2019 Imaging   CT A/P: No evidence of metastatic disease   04/27/2019 Initial Biopsy   EMB - Gr 2 EMCA   04/27/2019 Imaging   TVUS - 1.7cm endometrial lining, simple left ovarian cyst   04/27/2019 Initial Diagnosis   Endometrial cancer ([place])   06/24/2019 Surgery   D&C, Mirena IUD placement, vaginal and cervical biopsies Grade 2 endometrioid cancer Vaginal and cervical biopsies benign   09/14/2019 Surgery   Robotic-assisted laparoscopic total hysterectomy with bilateral salpingoophorectomy, SLN biopsy   On EUA, small mobile uterus, narrow introitus. ON intra-abdominal entry, normal upper abdominal survey. Normal appearing omentum, small and large bowel. Uterus 6-8 cm and normal appearing. Bilateral adnexa normal appearing with 2cm left para-ovarian hemorrhagic appearing cyst. Some retroperitoneal fibrosis on the left. Adhesions between the bladder and cervix. Mapping successful bilaterally: on the left, to deep obturator SLN. On right, mapping along two channels  in the pelvic basins with no lymph nodes identified. Two channels ultimately terminated in a low right para-aortic lymph node although the node itself was not green.   09/14/2019 Pathologic Stage   Stage IA, gr 2, no LVSI, neg SLNs      Interval History: Patient reports overall doing well.  She had some increased abdominal pain for the first day or 2 after surgery but reports this has improved each day since.  Her appetite was decreased but this has also been improving.  She denies any nausea or emesis.  She has had return of bowel function.  She notes very minimal pink spotting since surgery, denies any frank bleeding or discharge.  Is urinating without difficulty although has to go to the restroom frequently.  Has not had any urinary incontinence since leaving the hospital.  Denies any fevers or chills.  Moved a chair across her kitchen and is unsure how much the chair weighed, denies any other heavy lifting, pushing or pulling.  Past Medical/Surgical History: Past Medical History:  Diagnosis Date  . Arthritis   . CKD (chronic kidney disease), stage III    patient denies  . Depression   . Diabetes mellitus without complication ([place])   . Difficult intravenous access   . Endometrial cancer ([place])   . GERD (gastroesophageal reflux disease)   . Headache   . Hypertension   . Hypothyroidism   . Neuromuscular disorder (HCC)    neuropathy in feet  . Osteoarthritis   . PMB (postmenopausal bleeding)   . PONV (postoperative nausea and vomiting)    severe nausea and vomiting after knee replacement 06-2014, did ok with 2018 knee replacement  . Urinary frequency   . Wears glasses     Past Surgical  History:  Procedure Laterality Date  . BREAST SURGERY     cyst removed  . CESAREAN SECTION    . DILATION AND CURETTAGE OF UTERUS N/A 06/24/2019   Procedure: DILATATION AND CURETTAGE;  Surgeon: Lafonda Mosses, MD;  Location: [facility];  Service: Gynecology;  Laterality: N/A;  .  FRACTURE SURGERY     right knee  . INTRAUTERINE DEVICE (IUD) INSERTION N/A 06/24/2019   Procedure: INTRAUTERINE DEVICE (IUD) INSERTION MIRENA;  Surgeon: Lafonda Mosses, MD;  Location: [facility];  Service: Gynecology;  Laterality: N/A;  . IRRIGATION AND DEBRIDEMENT SEBACEOUS CYST    . JOINT REPLACEMENT Right    right  . LYMPH NODE DISSECTION N/A 09/14/2019   Procedure: LYMPH NODE DISSECTION;  Surgeon: Lafonda Mosses, MD;  Location: [facility];  Service: Gynecology;  Laterality: N/A;  . ROBOTIC ASSISTED TOTAL HYSTERECTOMY WITH BILATERAL SALPINGO OOPHERECTOMY Bilateral 09/14/2019   Procedure: XI ROBOTIC ASSISTED TOTAL HYSTERECTOMY WITH BILATERAL SALPINGO OOPHORECTOMY;  Surgeon: Lafonda Mosses, MD;  [facility]: [facility];  Service: Gynecology;  Laterality: Bilateral;  . SENTINEL NODE BIOPSY N/A 09/14/2019   Procedure: SENTINEL NODE BIOPSY;  Surgeon: Lafonda Mosses, MD;  [facility]: [facility];  Service: Gynecology;  Laterality: N/A;  . teeth extration    . TONSILLECTOMY    . TOTAL HIP ARTHROPLASTY Right 02/23/2015   Procedure: RIGHT TOTAL HIP ARTHROPLASTY ANTERIOR APPROACH;  Surgeon: Leandrew Koyanagi, MD;  Location: [place];  Service: Orthopedics;  Laterality: Right;  . TOTAL KNEE ARTHROPLASTY Left 06/27/2016   Procedure: LEFT TOTAL KNEE ARTHROPLASTY;  Surgeon: Leandrew Koyanagi, MD;  Location: [place];  Service: Orthopedics;  Laterality: Left;    Family History  Problem Relation Age of Onset  . Pancreatic cancer Mother   . Stroke Father   . Hypertension Father     Social History   Socioeconomic History  . Marital status: Widowed    Spouse name: Not on file  . Number of children: Not on file  . Years of education: Not on file  . Highest education level: Not on file  Occupational History  . Not on file  Tobacco Use  . Smoking status: Former Smoker    Packs/day: 1.50    Years: 45.00    Pack years: 67.50    Types: Cigarettes    Quit date: 04/22/2002    Years since quitting: 17.4   . Smokeless tobacco: Never Used  Substance and Sexual Activity  . Alcohol use: Yes    Comment: rarely  . Drug use: No  . Sexual activity: Not on file  Other Topics Concern  . Not on file  Social History Narrative  . Not on file   Social Determinants of Health   Financial Resource Strain:   . Difficulty of Paying Living Expenses:   Food Insecurity:   . Worried About Charity fundraiser in the Last Year:   . Arboriculturist in the Last Year:   Transportation Needs:   . Film/video editor (Medical):   Marland Kitchen Lack of Transportation (Non-Medical):   Physical Activity:   . Days of Exercise per Week:   . Minutes of Exercise per Session:   Stress:   . Feeling of Stress :   Social Connections:   . Frequency of Communication with Friends and Family:   . Frequency of Social Gatherings with Friends and Family:   . Attends Religious Services:   . Active Member of Clubs or Organizations:   .  Attends Archivist Meetings:   Marland Kitchen Marital Status:     Current Medications:  Current Outpatient Medications:  .  Accu-Chek FastClix Lancets MISC, CHECK BLOOD SUGAR TWICE DAILY, Disp: , Rfl:  .  calcium carbonate (CALCIUM 600) 600 MG TABS tablet, Take 600 mg by mouth at bedtime. , Disp: , Rfl:  .  fenofibrate 160 MG tablet, Take 160 mg by mouth daily after breakfast. , Disp: , Rfl: 2 .  HUMALOG MIX 75/25 KWIKPEN (75-25) 100 UNIT/ML Kwikpen, Inject 24-30 Units into the skin See admin instructions. Inject 30 units subcutaneously in the morning & inject 24 units subcutaneously in the evening., Disp: , Rfl: 3 .  metFORMIN (GLUCOPHAGE) 1000 MG tablet, Take 1,000 mg by mouth 2 (two) times daily. , Disp: , Rfl: 5 .  Multiple Vitamins-Minerals (MULTIVITAMIN WITH MINERALS) tablet, Take 1 tablet by mouth daily. Centrum Silver for Women, Disp: , Rfl:  .  senna-docusate (SENOKOT S) 8.6-50 MG tablet, Take 1 tablet by mouth at bedtime as needed. (Patient taking differently: Take 1 tablet by mouth at  bedtime as needed (constipation.). ), Disp: 30 tablet, Rfl: 1 .  telmisartan (MICARDIS) 80 MG tablet, Take 80 mg by mouth daily after breakfast. , Disp: , Rfl:  .  traMADol (ULTRAM) 50 MG tablet, Take 1 tablet (50 mg total) by mouth every 6 (six) hours as needed., Disp: 20 tablet, Rfl: 0  Review of Symptoms: Pertinent positives as per HPI, otherwise review of systems negative.  Physical Exam: There were no vitals taken for this visit. Or given limitations of phone visit.  Laboratory & Radiologic Studies: A. SENTINEL LYMPH NODE, RIGHT PARA AORTIC, BIOPSY:  - There is no evidence of carcinoma in 1 of 1 lymph node (0/1).  - See comment.   B. SENTINEL LYMPH NODE, LEFT DEEP OBTURATOR, BIOPSY:  - There is no evidence of carcinoma in 1 of 1 lymph node (0/1).  - See comment.   C. UTERUS, CERVIX, BILATERAL FALLOPIAN TUBE AND OVARIES:  - Invasive endometrioid adenocarcinoma, FIGO grade II/III, involving the  inner half of the myometrium.  - The surgical resection margins are negative for carcinoma.   - Myometrium: Unremarkable.  - Serosa: Unremarkable.  - Right adnexa: Benign ovary and tumor embolus within the right  fallopian tube lumen.  - Left adnexa: Benign ovary and fallopian tube with paratubal cyst.   - See oncology table below.   ONCOLOGY TABLE:   UTERUS, CARCINOMA OR CARCINOSARCOMA   Procedure: Hysterectomy and bilateral salpingo-oopherectomy  Histologic type: Endometrioid adenocarcinoma  Histologic Grade: FIGO grade II  Myometrial invasion:    Depth of invasion: 1 mm    Myometrial thickness: 11 mm  Uterine Serosa Involvement: Not identified  Cervical stromal involvement: Not identified  Extent of involvement of other organs: Not identified  Lymphovascular invasion: Not identified  Regional Lymph Nodes:    Examined:   2 Sentinel                0 non-sentinel                2 total     Lymph nodes with metastasis: 0      Isolated tumor cells (<0.2 mm): 0     Micrometastasis: (>0.2 mm and < 2.0 mm): 0     Macrometastasis: (>2.0 mm): 0  Representative Tumor Block: C5  MMR / MSI testing: MMR and MSI testing will be performed and the results  reported separately.  Pathologic Stage Classification (pTNM, AJCC  8th edition): pT1a, pN0,  FIGO stage Ia  Comments: Pancytokeratin performed on the lymph nodes is negative.   Assessment & Plan: Cassandra Dodson is a 79 y.o. 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  The patient is overall doing well and meeting postoperative milestones.  We discussed activity restrictions again.  I reviewed in detail with her the pathology report.  She has 1 high-intermediate risk factor and given her age, recommendation is for vaginal brachytherapy to decrease the risk of local recurrence.  We discussed referral to radiation oncology and treatment after her vaginal cuff heals.  Patient amenable.  She is aware of her follow-up visit with me in clinic in mid June and to call the clinic if she develops any problems prior to that.  I discussed the assessment and treatment plan with the patient. The patient was provided with an opportunity to ask questions and all were answered. The patient agreed with the plan and demonstrated an understanding of the instructions.   The patient was advised to call back or see an in-person evaluation if the symptoms worsen or if the condition fails to improve as anticipated.   15 minutes of total time was spent for this patient encounter, including preparation, face-to-face counseling with the patient and coordination of care, and documentation of the encounter.   Jeral Pinch, MD  Division of Gynecologic Oncology  Department of Obstetrics and Gynecology  [facility]

## 2019-09-21 ENCOUNTER — Telehealth: Payer: Self-pay | Admitting: *Deleted

## 2019-09-21 NOTE — Telephone Encounter (Signed)
Patient called and asked when she can get the COVID vaccine. Explained she can receive the vaccine now. Patient verbalized understanding

## 2019-09-22 ENCOUNTER — Inpatient Hospital Stay: Payer: Medicare Other | Admitting: Gynecologic Oncology

## 2019-09-23 ENCOUNTER — Encounter (HOSPITAL_COMMUNITY): Payer: Self-pay | Admitting: Gynecologic Oncology

## 2019-09-24 ENCOUNTER — Ambulatory Visit: Payer: Medicare Other | Admitting: Gynecologic Oncology

## 2019-09-30 ENCOUNTER — Ambulatory Visit: Payer: Medicare Other | Attending: Internal Medicine

## 2019-09-30 DIAGNOSIS — Z23 Encounter for immunization: Secondary | ICD-10-CM

## 2019-09-30 NOTE — Progress Notes (Signed)
   Covid-19 Vaccination Clinic  Name:  Cassandra Dodson    MRN: [id number] DOB: [birth year]  09/30/2019  Cassandra Dodson was observed post Covid-19 immunization for 15 minutes without incident. She was provided with Vaccine Information Sheet and instruction to access the V-Safe system.   Cassandra Dodson was instructed to call 911 with any severe reactions post vaccine: Marland Kitchen Difficulty breathing  . Swelling of face and throat  . A fast heartbeat  . A bad rash all over body  . Dizziness and weakness   Immunizations Administered    Name Date Dose VIS Date Route   Pfizer COVID-19 Vaccine 09/30/2019  3:45 PM 0.3 mL 06/16/2018 Intramuscular   Manufacturer: [employer]   Lot: [id number]   [place]: [id number]

## 2019-10-05 NOTE — Progress Notes (Signed)
Gynecologic Oncology Return Clinic Visit  6/17  Reason for Visit: post-op follow-up  Treatment History: Oncology History Overview Note  Surgery initially scheduled for early March 2021, delayed given uncontrolled diabetes. Mirena IUD placed instead  IHC MMR intact, MSI-stable   Endometrial cancer ([place])  03/30/2019 Imaging   CT A/P: No evidence of metastatic disease   04/27/2019 Initial Biopsy   EMB - Gr 2 EMCA   04/27/2019 Imaging   TVUS - 1.7cm endometrial lining, simple left ovarian cyst   04/27/2019 Initial Diagnosis   Endometrial cancer ([place])   06/24/2019 Surgery   D&C, Mirena IUD placement, vaginal and cervical biopsies Grade 2 endometrioid cancer Vaginal and cervical biopsies benign   09/14/2019 Surgery   Robotic-assisted laparoscopic total hysterectomy with bilateral salpingoophorectomy, SLN biopsy   On EUA, small mobile uterus, narrow introitus. ON intra-abdominal entry, normal upper abdominal survey. Normal appearing omentum, small and large bowel. Uterus 6-8 cm and normal appearing. Bilateral adnexa normal appearing with 2cm left para-ovarian hemorrhagic appearing cyst. Some retroperitoneal fibrosis on the left. Adhesions between the bladder and cervix. Mapping successful bilaterally: on the left, to deep obturator SLN. On right, mapping along two channels in the pelvic basins with no lymph nodes identified. Two channels ultimately terminated in a low right para-aortic lymph node although the node itself was not green.   09/14/2019 Pathologic Stage   Stage IA, gr 2, no LVSI, neg SLNs      Interval History: Patient reports overall doing well after surgery.  She has had minimal vaginal spotting, denies any vaginal discharge.  Denies any fevers or chills.  Is tolerating a regular diet without any nausea or emesis.  Reports regular urinary function.  Has had some constipation.  Past Medical/Surgical History: Past Medical History:  Diagnosis Date  . Arthritis   . CKD  (chronic kidney disease), stage III    patient denies  . Depression   . Diabetes mellitus without complication ([place])   . Difficult intravenous access   . Endometrial cancer ([place])   . GERD (gastroesophageal reflux disease)   . Headache   . Hypertension   . Hypothyroidism   . Neuromuscular disorder (H[facility])    neuropathy in feet  . Osteoarthritis   . PMB (postmenopausal bleeding)   . PONV (postoperative nausea and vomiting)    severe nausea and vomiting after knee replacement 06-2014, did ok with 2018 knee replacement  . Urinary frequency   . Wears glasses     Past Surgical History:  Procedure Laterality Date  . BREAST SURGERY     cyst removed  . CESAREAN SECTION    . DILATION AND CURETTAGE OF UTERUS N/A 06/24/2019   Procedure: DILATATION AND CURETTAGE;  Surgeon: Lafonda Mosses, MD;  Location: [facility];  Service: Gynecology;  Laterality: N/A;  . FRACTURE SURGERY     right knee  . INTRAUTERINE DEVICE (IUD) INSERTION N/A 06/24/2019   Procedure: INTRAUTERINE DEVICE (IUD) INSERTION MIRENA;  Surgeon: Lafonda Mosses, MD;  Location: [facility];  Service: Gynecology;  Laterality: N/A;  . IRRIGATION AND DEBRIDEMENT SEBACEOUS CYST    . JOINT REPLACEMENT Right    right  . LYMPH NODE DISSECTION N/A 09/14/2019   Procedure: LYMPH NODE DISSECTION;  Surgeon: Lafonda Mosses, MD;  [facility]: [facility];  Service: Gynecology;  Laterality: N/A;  . ROBOTIC ASSISTED TOTAL HYSTERECTOMY WITH BILATERAL SALPINGO OOPHERECTOMY Bilateral 09/14/2019   Procedure: XI ROBOTIC ASSISTED TOTAL HYSTERECTOMY WITH BILATERAL SALPINGO OOPHORECTOMY;  Surgeon: Lafonda Mosses,  MD;  Location: [facility];  Service: Gynecology;  Laterality: Bilateral;  . SENTINEL NODE BIOPSY N/A 09/14/2019   Procedure: SENTINEL NODE BIOPSY;  Surgeon: Lafonda Mosses, MD;  [facility];  Service: Gynecology;  Laterality: N/A;  . teeth extration    . TONSILLECTOMY    . TOTAL HIP ARTHROPLASTY Right  02/23/2015   Procedure: RIGHT TOTAL HIP ARTHROPLASTY ANTERIOR APPROACH;  Surgeon: Leandrew Koyanagi, MD;  Location: [place];  Service: Orthopedics;  Laterality: Right;  . TOTAL KNEE ARTHROPLASTY Left 06/27/2016   Procedure: LEFT TOTAL KNEE ARTHROPLASTY;  Surgeon: Leandrew Koyanagi, MD;  Location: [place];  Service: Orthopedics;  Laterality: Left;    Family History  Problem Relation Age of Onset  . Pancreatic cancer Mother   . Stroke Father   . Hypertension Father     Social History   Socioeconomic History  . Marital status: Widowed    Spouse name: Not on file  . Number of children: Not on file  . Years of education: Not on file  . Highest education level: Not on file  Occupational History  . Not on file  Tobacco Use  . Smoking status: Former Smoker    Packs/day: 1.50    Years: 45.00    Pack years: 67.50    Types: Cigarettes    Quit date: 04/22/2002    Years since quitting: 17.4  . Smokeless tobacco: Never Used  Vaping Use  . Vaping Use: Never used  Substance and Sexual Activity  . Alcohol use: Yes    Comment: rarely  . Drug use: No  . Sexual activity: Not on file  Other Topics Concern  . Not on file  Social History Narrative  . Not on file   Social Determinants of Health   Financial Resource Strain:   . Difficulty of Paying Living Expenses:   Food Insecurity:   . Worried About Charity fundraiser in the Last Year:   . Arboriculturist in the Last Year:   Transportation Needs:   . Film/video editor (Medical):   Marland Kitchen Lack of Transportation (Non-Medical):   Physical Activity:   . Days of Exercise per Week:   . Minutes of Exercise per Session:   Stress:   . Feeling of Stress :   Social Connections:   . Frequency of Communication with Friends and Family:   . Frequency of Social Gatherings with Friends and Family:   . Attends Religious Services:   . Active Member of Clubs or Organizations:   . Attends Archivist Meetings:   Marland Kitchen Marital Status:     Current  Medications:  Current Outpatient Medications:  .  Accu-Chek FastClix Lancets MISC, CHECK BLOOD SUGAR TWICE DAILY, Disp: , Rfl:  .  calcium carbonate (CALCIUM 600) 600 MG TABS tablet, Take 600 mg by mouth at bedtime. , Disp: , Rfl:  .  fenofibrate 160 MG tablet, Take 160 mg by mouth daily after breakfast. , Disp: , Rfl: 2 .  HUMALOG MIX 75/25 KWIKPEN (75-25) 100 UNIT/ML Kwikpen, Inject 24-30 Units into the skin See admin instructions. Inject 30 units subcutaneously in the morning & inject 24 units subcutaneously in the evening., Disp: , Rfl: 3 .  metFORMIN (GLUCOPHAGE) 1000 MG tablet, Take 1,000 mg by mouth 2 (two) times daily. , Disp: , Rfl: 5 .  Multiple Vitamins-Minerals (MULTIVITAMIN WITH MINERALS) tablet, Take 1 tablet by mouth daily. Centrum Silver for Women, Disp: , Rfl:  .  senna-docusate (SENOKOT S)  8.6-50 MG tablet, Take 1 tablet by mouth at bedtime as needed. (Patient taking differently: Take 1 tablet by mouth at bedtime as needed (constipation.). ), Disp: 30 tablet, Rfl: 1 .  telmisartan (MICARDIS) 80 MG tablet, Take 80 mg by mouth daily after breakfast. , Disp: , Rfl:  .  traMADol (ULTRAM) 50 MG tablet, Take 1 tablet (50 mg total) by mouth every 6 (six) hours as needed., Disp: 20 tablet, Rfl: 0  Review of Systems: Denies appetite changes, fevers, chills, fatigue, unexplained weight changes. Denies hearing loss, neck lumps or masses, mouth sores, ringing in ears or voice changes. Denies cough or wheezing.  Denies shortness of breath. Denies chest pain or palpitations. Denies leg swelling. Denies abdominal distention, pain, blood in stools, diarrhea, nausea, vomiting, or early satiety. Denies pain with intercourse, dysuria, frequency, hematuria or incontinence. Denies hot flashes, pelvic pain, vaginal bleeding or vaginal discharge.   Denies joint pain, back pain or muscle pain/cramps. Denies itching, rash, or wounds. Denies dizziness, headaches, numbness or seizures. Denies swollen  lymph nodes or glands, denies easy bruising or bleeding. Denies anxiety, depression, confusion, or decreased concentration.  Physical Exam: BP (!) 136/93 (BP Location: Right Arm, Patient Position: Sitting)   Pulse 78   Temp 98.3 F (36.8 C) (Oral)   Resp 18   Ht 5' 5"  (1.651 m)   Wt 190 lb 2 oz (86.2 kg)   SpO2 100%   BMI 31.64 kg/m  General: Alert, oriented, no acute distress. HEENT: Normocephalic, atraumatic, sclera anicteric. Chest: Unlabored breathing on room air. Abdomen: Obese, soft, nontender.  Normoactive bowel sounds.  No masses or hepatosplenomegaly appreciated.  No healing laparoscopic incisions. Extremities: Grossly normal range of motion.  Warm, well perfused.  1+ edema bilaterally. Skin: No rashes or lesions noted. GU: Normal appearing external genitalia without erythema, excoriation, or lesions.  Speculum exam reveals cuff intact, no bleeding or discharge.  Bimanual exam reveals cuff intact, no tenderness with palpation or fluctuance.    Laboratory & Radiologic Studies: None new  Assessment & Plan: JESS SULAK is a 79 y.o. woman with Stage IA grade 2 endometrial cancer who presents for post-operative follow-up.  Patient is overall healing well from surgery and meeting milestones.  Discussed some further bowel regimen since she continues to be constipated.  I discussed with Santiago Glad, our nurse navigator, getting the patient scheduled to see radiation oncology to plan vaginal brachytherapy.  Patient is aware of continued activity restrictions until she is 6-8 weeks out from surgery.  Per SGO surveillance recommendations, discussed plan for visits every 6 months for the first year and then yearly until 5 years out.  We also discussed signs and symptoms that would be concerning for recurrence and that they should prompt a phone call to clinic to be seen prior to her next scheduled visit.  20 minutes of total time was spent for this patient encounter, including preparation,  face-to-face counseling with the patient and coordination of care, and documentation of the encounter.  Jeral Pinch, MD  Division of Gynecologic Oncology  Department of Obstetrics and Gynecology  [facility]

## 2019-10-07 ENCOUNTER — Other Ambulatory Visit: Payer: Self-pay

## 2019-10-07 ENCOUNTER — Encounter: Payer: Self-pay | Admitting: Oncology

## 2019-10-07 ENCOUNTER — Inpatient Hospital Stay: Payer: Medicare Other | Attending: Gynecologic Oncology | Admitting: Gynecologic Oncology

## 2019-10-07 ENCOUNTER — Encounter: Payer: Self-pay | Admitting: Gynecologic Oncology

## 2019-10-07 VITALS — BP 136/93 | HR 78 | Temp 98.3°F | Resp 18 | Ht 65.0 in | Wt 190.1 lb

## 2019-10-07 DIAGNOSIS — C541 Malignant neoplasm of endometrium: Secondary | ICD-10-CM

## 2019-10-07 NOTE — Patient Instructions (Signed)
Your exam is reassuring today and you are healing well.  Please use something to keep your bowels regular for the next couple of weeks.  Your lifting restrictions are in place until you are 6 weeks out and nothing in your vagina until you are 8 weeks out.  I will see you every 6 months for the next year.  If you develop any symptoms such as vaginal bleeding, discharge, or pelvic pain before your next visit with me, please call the clinic.

## 2019-10-07 NOTE — Progress Notes (Signed)
Referral placed for Radiation Oncology per Dr. Berline Lopes.

## 2019-10-21 ENCOUNTER — Ambulatory Visit: Payer: Medicare Other | Attending: Internal Medicine

## 2019-10-21 DIAGNOSIS — Z23 Encounter for immunization: Secondary | ICD-10-CM

## 2019-10-21 NOTE — Progress Notes (Signed)
° °  Covid-19 Vaccination Clinic  Name:  Cassandra Dodson    MRN: [id number] DOB: 29-Sep-1940  10/21/2019  [name] was observed post Covid-19 immunization for 15 minutes without incident. She was provided with Vaccine Information Sheet and instruction to access the V-Safe system.   [name] was instructed to call 911 with any severe reactions post vaccine:  Difficulty breathing   Swelling of face and throat   A fast heartbeat   A bad rash all over body   Dizziness and weakness   Immunizations Administered    Name Date Dose VIS Date Route   Pfizer COVID-19 Vaccine 10/21/2019  3:52 PM 0.3 mL 06/16/2018 Intramuscular   Manufacturer: [employer]   Lot: [id number]   [place]: [id number]

## 2019-10-26 ENCOUNTER — Ambulatory Visit: Payer: Self-pay | Admitting: Radiation Oncology

## 2019-10-28 ENCOUNTER — Ambulatory Visit: Payer: Medicare Other | Admitting: Radiation Oncology

## 2019-11-01 NOTE — Progress Notes (Signed)
Radiation Oncology         (336) [phone] ________________________________  Initial Outpatient Consultation  Name: Cassandra Dodson MRN: [id number]  Date: 11/03/2019  DOB: [birth year]  UU:VOZDG, Cassandra Reichmann, MD  Cassandra Mosses, MD   REFERRING PHYSICIAN: Lafonda Mosses, MD  DIAGNOSIS: The encounter diagnosis was Endometrial cancer [facility]).  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 female who is seen as a courtesy of Dr. Berline Dodson for an opinion concerning radiation therapy as part of management for her recently diagnosed endometrial cancer. Today, she is accompanied by no one. The patient presented to Dr. Shon Dodson, PCP, in December of 2020 with complaints of several month history of postmenopausal vaginal bleeding and weight loss. CT scan of abdomen and pelvis on 03/30/2019 showed a normal appearance of the uterus. There was no evidence of an acute abnormality within the abdomen or pelvis. The patient was referred to gynecology and underwent a pelvic ultrasound and endometrial biopsy on 04/27/2019. The pelvic ultrasound was notable for a small uterus but 1.7 cm endometrial lining with increased blood flow in calcifications. The left ovary had a simple cyst that measured 2.1 cm in greatest dimension and the right ovary was normal in appearance. There was no free fluid. Biopsy was significant for grade 2 endometrioid carcinoma.  The patient was referred to Dr. Berline Dodson and was seen in consultation on 05/13/2019. At that time, it was recommended that the patient proceed with surgical staging including a total hysterectomy, bilateral salpingo-oophorectomy, and lymph node assessment.  Of note, the patient's surgery was originally scheduled for 05/20/2019 but had to be rescheduled secondary to elevated Hg A1c of 9.5. Given her continued elevated glucose levels, they opted to proceed with a dilatation and curettage,  Mirena IUD insertion, cervical biopsy, and vaginal biopsy on 06/24/2019 that was performed by Dr. Berline Dodson. Pathology from the procedure revealed FIGO grade 2 endometrioid carcinoma of the endometrium. The left-sided wall of the vagina at the 3 o'clock position was positive for squamous type mucosa that was inflamed with reactive changes but was benign. Cervical biopsy at the 6 o'clock position showed cervical transformation zone-type mucosa that was inflamed with reactive changes but also benign.  The patient underwent a robotic-assisted laparoscopic total hysterectomy with bilateral salpingo-oophorectomy and sentinel lymph node biopsy on 09/14/2019 that was performed by Dr. Berline Dodson. Pathology from the procedure revealed FIGO grade 2 invasive endometrioid adenocarcinoma that involved the inner half of the myometrium. The surgical resection margins were negative for carcinoma. One left deep obturator sentinel lymph node and one right para-aortic sentinel lymph node were biopsied and both were negative for carcinoma.  The patient was last seen by Dr. Berline Dodson on 10/07/2019, during which time she was doing well post-operatively and was having minimal vaginal spotting. They discussed referral to radiation oncology for vaginal brachytherapy.  Of note, the patient received her COVID-19 vaccinations on 09/30/2019 and 10/21/2019.  PREVIOUS RADIATION THERAPY: No  PAST MEDICAL HISTORY:  Past Medical History:  Diagnosis Date  . Arthritis   . CKD (chronic kidney disease), stage III    patient denies  . Depression   . Diabetes mellitus without complication ([place])   . Difficult intravenous access   . Endometrial cancer ([place])   . GERD (gastroesophageal reflux disease)   . Headache   . Hypertension   . Hypothyroidism   . Neuromuscular disorder (H[facility])    neuropathy in feet  . Osteoarthritis   .  PMB (postmenopausal bleeding)   . PONV (postoperative nausea and vomiting)    severe nausea and vomiting after knee  replacement 06-2014, did ok with 2018 knee replacement  . Urinary frequency   . Wears glasses     PAST SURGICAL HISTORY: Past Surgical History:  Procedure Laterality Date  . BREAST SURGERY     cyst removed  . CESAREAN SECTION    . DILATION AND CURETTAGE OF UTERUS N/A 06/24/2019   Procedure: DILATATION AND CURETTAGE;  Surgeon: Cassandra Mosses, MD;  Location: [facility];  Service: Gynecology;  Laterality: N/A;  . FRACTURE SURGERY     right knee  . INTRAUTERINE DEVICE (IUD) INSERTION N/A 06/24/2019   Procedure: INTRAUTERINE DEVICE (IUD) INSERTION MIRENA;  Surgeon: Cassandra Mosses, MD;  Location: [facility];  Service: Gynecology;  Laterality: N/A;  . IRRIGATION AND DEBRIDEMENT SEBACEOUS CYST    . JOINT REPLACEMENT Right    right  . LYMPH NODE DISSECTION N/A 09/14/2019   Procedure: LYMPH NODE DISSECTION;  Surgeon: Cassandra Mosses, MD;  Location: [facility];  Service: Gynecology;  Laterality: N/A;  . ROBOTIC ASSISTED TOTAL HYSTERECTOMY WITH BILATERAL SALPINGO OOPHERECTOMY Bilateral 09/14/2019   Procedure: XI ROBOTIC ASSISTED TOTAL HYSTERECTOMY WITH BILATERAL SALPINGO OOPHORECTOMY;  Surgeon: Cassandra Mosses, MD;  Location: [facility];  Service: Gynecology;  Laterality: Bilateral;  . SENTINEL NODE BIOPSY N/A 09/14/2019   Procedure: SENTINEL NODE BIOPSY;  Surgeon: Cassandra Mosses, MD;  Location: [facility];  Service: Gynecology;  Laterality: N/A;  . teeth extration    . TONSILLECTOMY    . TOTAL HIP ARTHROPLASTY Right 02/23/2015   Procedure: RIGHT TOTAL HIP ARTHROPLASTY ANTERIOR APPROACH;  Surgeon: Leandrew Koyanagi, MD;  Location: [place];  Service: Orthopedics;  Laterality: Right;  . TOTAL KNEE ARTHROPLASTY Left 06/27/2016   Procedure: LEFT TOTAL KNEE ARTHROPLASTY;  Surgeon: Leandrew Koyanagi, MD;  Location: [place];  Service: Orthopedics;  Laterality: Left;    FAMILY HISTORY:  Family History  Problem Relation Age of Onset  . Pancreatic cancer Mother   . Stroke Father     . Hypertension Father     SOCIAL HISTORY:  Social History   Tobacco Use  . Smoking status: Former Smoker    Packs/day: 1.50    Years: 45.00    Pack years: 67.50    Types: Cigarettes    Quit date: 04/22/2002    Years since quitting: 17.5  . Smokeless tobacco: Never Used  Vaping Use  . Vaping Use: Never used  Substance Use Topics  . Alcohol use: Yes    Comment: rarely  . Drug use: No    ALLERGIES:  Allergies  Allergen Reactions  . Anesthetics, Amide Other (See Comments)    Pt is intolerant to general anesthesia. Pt will throw up and has thrown up during procedure.   . Ether Nausea And Vomiting    UNSPECIFIED REACTION     MEDICATIONS:  Current Outpatient Medications  Medication Sig Dispense Refill  . Accu-Chek FastClix Lancets MISC CHECK BLOOD SUGAR TWICE DAILY    . calcium carbonate (CALCIUM 600) 600 MG TABS tablet Take 600 mg by mouth at bedtime.     . fenofibrate 160 MG tablet Take 160 mg by mouth daily after breakfast.   2  . HUMALOG MIX 75/25 KWIKPEN (75-25) 100 UNIT/ML Kwikpen Inject 24-30 Units into the skin See admin instructions. Inject 30 units subcutaneously in the morning & inject 24 units subcutaneously in the evening.  3  .  metFORMIN (GLUCOPHAGE) 1000 MG tablet Take 1,000 mg by mouth 2 (two) times daily.   5  . Multiple Vitamins-Minerals (MULTIVITAMIN WITH MINERALS) tablet Take 1 tablet by mouth daily. Centrum Silver for Women    . senna-docusate (SENOKOT S) 8.6-50 MG tablet Take 1 tablet by mouth at bedtime as needed. (Patient taking differently: Take 1 tablet by mouth at bedtime as needed (constipation.). ) 30 tablet 1  . telmisartan (MICARDIS) 80 MG tablet Take 80 mg by mouth daily after breakfast.     . traMADol (ULTRAM) 50 MG tablet Take 1 tablet (50 mg total) by mouth every 6 (six) hours as needed. 20 tablet 0   No current facility-administered medications for this encounter.    REVIEW OF SYSTEMS:  A 10+ POINT REVIEW OF SYSTEMS WAS OBTAINED including  neurology, dermatology, psychiatry, cardiac, respiratory, lymph, extremities, GI, GU, musculoskeletal, constitutional, reproductive, HEENT.  She denies any pelvic pain, bloating or vaginal bleeding at this time.  She denies any urinary symptoms or bowel complaints   PHYSICAL EXAM:  height is 5\' 5"  (1.651 m) and weight is 193 lb (87.5 kg). Her temperature is 98.6 F (37 C). Her blood pressure is 158/83 (abnormal) and her pulse is 85. Her respiration is 20 and oxygen saturation is 98%.   General: Alert and oriented, in no acute distress, ambulates with the assistance of a cane with some balance issues related to prior knee and hip surgeries. HEENT: Head is normocephalic. Extraocular movements are intact.  Neck: Neck is supple, no palpable cervical or supraclavicular lymphadenopathy. Heart: Regular in rate and rhythm with no murmurs, rubs, or gallops. Chest: Clear to auscultation bilaterally, with no rhonchi, wheezes, or rales. Abdomen: Soft, nontender, nondistended, with no rigidity or guarding. Extremities: No cyanosis or edema. Lymphatics: see Neck Exam Skin: No concerning lesions. Musculoskeletal: symmetric strength and muscle tone throughout. Neurologic: Cranial nerves II through XII are grossly intact. No obvious focalities. Speech is fluent. Coordination is intact. Psychiatric: Judgment and insight are intact. Affect is appropriate. Pelvic exam deferred until simulation planning and treatment date tomorrow  ECOG = 1  0 - Asymptomatic (Fully active, able to carry on all predisease activities without restriction)  1 - Symptomatic but completely ambulatory (Restricted in physically strenuous activity but ambulatory and able to carry out work of a light or sedentary nature. For example, light housework, office work)  2 - Symptomatic, <50% in bed during the day (Ambulatory and capable of all self care but unable to carry out any work activities. Up and about more than 50% of waking hours)  3  - Symptomatic, >50% in bed, but not bedbound (Capable of only limited self-care, confined to bed or chair 50% or more of waking hours)  4 - Bedbound (Completely disabled. Cannot carry on any self-care. Totally confined to bed or chair)  5 - Death   Eustace Pen MM, Creech RH, Tormey DC, et al. [phone]). "Toxicity and response criteria of the [facility]". [place] Oncol. 5 (6): 649-55  LABORATORY DATA:  Lab Results  Component Value Date   WBC 7.2 09/03/2019   HGB 12.4 09/03/2019   HCT 41.1 09/03/2019   MCV 86.9 09/03/2019   PLT 184 09/03/2019   NEUTROABS 4 07/02/2016   Lab Results  Component Value Date   NA 136 09/03/2019   K 4.0 09/03/2019   CL 103 09/03/2019   CO2 25 09/03/2019   GLUCOSE 185 (H) 09/03/2019   CREATININE 1.32 (H) 09/03/2019   CALCIUM 8.9  09/03/2019      RADIOGRAPHY: No results found.    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  The patient's case was presented at the multidisciplinary gynecologic oncology conference and recommendations were made for vaginal brachytherapy.  Today, I talked to the patient  about the findings and work-up thus far.  We discussed the natural history of endometrial cancer and general treatment, highlighting the role of radiotherapy (high-dose-rate brachytherapy) in the management.  We discussed the available radiation techniques, and focused on the details of logistics and delivery.  We reviewed the anticipated acute and late sequelae associated with radiation in this setting.  The patient was encouraged to ask questions that I answered to the best of my ability.  A patient consent form was discussed and signed.  We retained a copy for our records.  The patient would like to proceed with radiation and  will be scheduled for CT simulation.  PLAN: Patient will return tomorrow for examination and fitting for her vaginal cylinder.  She will then proceed with planning and her first treatment in the edition.  Anticipate 5 high-dose-rate treatments directed at the vaginal cuff using iridium 192 with high-dose-rate source.  Total time spent in this encounter was 60 minutes which included reviewing the patient's most recent CT scan, consultation, follow-ups, surgeries, pathology, physical examination, and documentation.  ------------------------------------------------  Blair Promise, PhD, MD  This document serves as a record of services personally performed by Gery Pray, MD. It was created on his behalf by Clerance Lav, a trained medical scribe. The creation of this record is based on the scribe's personal observations and the provider's statements to them. This document has been checked and approved by the attending provider.

## 2019-11-02 NOTE — Progress Notes (Signed)
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 She had mild spotting and reached out to her primary care provider.  They discussed referral to urologist but then her bleeding stopped.  It began again in December 2020 and she had an episode with significant bleeding where there was "blood all over the bathroom".  She reached out to Dr. Virgina Jock and ultimately was referred to a gynecologist.  Biopsies revealed:  09/14/2019 FINAL MICROSCOPIC DIAGNOSIS:  A. SENTINEL LYMPH NODE, RIGHT PARA AORTIC, BIOPSY:  - There is no evidence of carcinoma in 1 of 1 lymph node (0/1).  - See comment.  B. SENTINEL LYMPH NODE, LEFT DEEP OBTURATOR, BIOPSY:  - There is no evidence of carcinoma in 1 of 1 lymph node (0/1).  - See comment.  C. UTERUS, CERVIX, BILATERAL FALLOPIAN TUBE AND OVARIES:  - Invasive endometrioid adenocarcinoma, FIGO grade II/III, involving the  inner half of the myometrium.  - The surgical resection margins are negative for carcinoma.  - Myometrium: Unremarkable.  - Serosa: Unremarkable.  - Right adnexa: Benign ovary and tumor embolus within the right fallopian tube lumen.  - Left adnexa: Benign ovary and fallopian tube with paratubal cyst.  Past/Anticipated interventions by Gyn/Onc surgery, if any:  (Surgery initially scheduled for early March 2021, delayed given uncontrolled diabetes. Mirena IUD placed instead) 09/14/2019 Dr. Jeral Pinch Robotic-assisted laparoscopic total hysterectomy with bilateral salpingoophorectomy, SLN biopsy   Past/Anticipated interventions by medical oncology, if any:  Nothing scheduled as of yet  Weight changes, if any: Lost 7 lbs for her surgery in May.  Bowel/Bladder complaints, if any: has had constipation, eating strawberries that helps.  Nausea/Vomiting, if any: none  Pain issues,  if any: none  SAFETY ISSUES:  Prior radiation? no  Pacemaker/ICD? no  Possible current pregnancy? No  Is the patient on methotrexate? No  Current Complaints / other details:    BP (!) 158/83 (BP Location: Left Arm, Patient Position: Sitting, Cuff Size: Large)   Pulse 85   Temp 98.6 F (37 C)   Resp 20   Ht 5\' 5"  (1.651 m)   Wt 193 lb (87.5 kg)   SpO2 98%   BMI 32.12 kg/m   Wt Readings from Last 3 Encounters:  11/03/19 193 lb (87.5 kg)  10/07/19 190 lb 2 oz (86.2 kg)  09/03/19 194 lb (88 kg)

## 2019-11-03 ENCOUNTER — Other Ambulatory Visit: Payer: Self-pay

## 2019-11-03 ENCOUNTER — Ambulatory Visit
Admission: RE | Admit: 2019-11-03 | Discharge: 2019-11-03 | Disposition: A | Discharge status: Home / Self Care | Payer: Medicare Other | Source: Ambulatory Visit | Attending: Radiation Oncology | Admitting: Radiation Oncology

## 2019-11-03 ENCOUNTER — Encounter: Payer: Self-pay | Admitting: Radiation Oncology

## 2019-11-03 VITALS — BP 158/83 | HR 85 | Temp 98.6°F | Resp 20 | Ht 65.0 in | Wt 193.0 lb

## 2019-11-03 DIAGNOSIS — E119 Type 2 diabetes mellitus without complications: Secondary | ICD-10-CM | POA: Diagnosis not present

## 2019-11-03 DIAGNOSIS — K219 Gastro-esophageal reflux disease without esophagitis: Secondary | ICD-10-CM | POA: Diagnosis not present

## 2019-11-03 DIAGNOSIS — Z87891 Personal history of nicotine dependence: Secondary | ICD-10-CM | POA: Diagnosis not present

## 2019-11-03 DIAGNOSIS — N183 Chronic kidney disease, stage 3 unspecified: Secondary | ICD-10-CM | POA: Insufficient documentation

## 2019-11-03 DIAGNOSIS — Z79899 Other long term (current) drug therapy: Secondary | ICD-10-CM | POA: Insufficient documentation

## 2019-11-03 DIAGNOSIS — I129 Hypertensive chronic kidney disease with stage 1 through stage 4 chronic kidney disease, or unspecified chronic kidney disease: Secondary | ICD-10-CM | POA: Diagnosis not present

## 2019-11-03 DIAGNOSIS — Z90722 Acquired absence of ovaries, bilateral: Secondary | ICD-10-CM | POA: Diagnosis not present

## 2019-11-03 DIAGNOSIS — Z90710 Acquired absence of both cervix and uterus: Secondary | ICD-10-CM | POA: Diagnosis not present

## 2019-11-03 DIAGNOSIS — M1990 Unspecified osteoarthritis, unspecified site: Secondary | ICD-10-CM | POA: Diagnosis not present

## 2019-11-03 DIAGNOSIS — M129 Arthropathy, unspecified: Secondary | ICD-10-CM | POA: Diagnosis not present

## 2019-11-03 DIAGNOSIS — E039 Hypothyroidism, unspecified: Secondary | ICD-10-CM | POA: Diagnosis not present

## 2019-11-03 DIAGNOSIS — C541 Malignant neoplasm of endometrium: Secondary | ICD-10-CM | POA: Diagnosis present

## 2019-11-03 DIAGNOSIS — Z7984 Long term (current) use of oral hypoglycemic drugs: Secondary | ICD-10-CM | POA: Insufficient documentation

## 2019-11-03 NOTE — Progress Notes (Signed)
  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  Identity was confirmed.  All relevant records and images related to the planned course of therapy were reviewed.  The patient freely provided informed written consent to proceed with treatment after reviewing the details related to the planned course of therapy. The consent form was witnessed and verified by the simulation staff.  Then, the patient was set-up in a stable reproducible  supine position for radiation therapy.  CT images were obtained.  Surface markings were placed.  The CT images were loaded into the planning software.  Then the target and avoidance structures were contoured.  Treatment planning then occurred.  The radiation prescription was entered and confirmed.   I have requested : Brachytherapy Isodose Plan and Dosimetry Calculations to plan the radiation distribution.    PLAN:  The patient will receive 30 Gy in 5 fractions.  Patient will be treated with a 2.5 cm diameter segmented cylinder with a treatment length of 3 cm.  Prescription will be to the vaginal mucosal surface.  Iridium 192 will be the high-dose-rate source.  _______________________________   Blair Promise, PhD, MD  This document serves as a record of services personally performed by Gery Pray, MD. It was created on his behalf by Clerance Lav, a trained medical scribe. The creation of this record is based on the scribe's personal observations and the provider's statements to them. This document has been checked and approved by the attending provider.

## 2019-11-03 NOTE — Progress Notes (Signed)
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 She will be treated with a 2.5 cm diameter segmented cylinder. This conforms to her anatomy without undue discomfort.  Vaginal brachytherapy procedure node: The patient was brought to the [place] suite. Identity was confirmed. All relevant records and images related to the planned course of therapy were reviewed. The patient freely provided informed written consent to proceed with treatment after reviewing the details related to the planned course of therapy. The consent form was witnessed and verified by the simulation staff. Then, the patient was set-up in a stable reproducible supine position for radiation therapy. Pelvic exam revealed the vaginal cuff to be intact . The patient's custom vaginal cylinder was placed in the proximal vagina. This was affixed to the CT/MR stabilization plate to prevent slippage. Patient tolerated the placement well.  Verification simulation note:  A fiducial marker was placed within the vaginal cylinder. An AP and lateral film was then obtained through the pelvis area. This documented accurate position of the vaginal cylinder for treatment.  HDR BRACHYTHERAPY TREATMENT  The remote afterloading device was affixed to the vaginal cylinder by catheter. Patient then proceeded to undergo her first high-dose-rate treatment directed at the proximal vagina. The patient was prescribed a dose of 6.0 gray to be delivered to the mucosal surface. Treatment length was 3.0 cm. Patient was treated with 1 channel using 7 dwell positions. Treatment time was 141.4 seconds. Iridium 192 was the  high-dose-rate source for treatment. The patient tolerated the treatment well. After completion of her therapy, a radiation survey was performed documenting return of the iridium source into the [employer] safe.   PLAN: The patient will return next week for her second high-dose-rate treatment. ________________________________    Blair Promise, PhD, MD  This document serves as a record of services personally performed by Gery Pray, MD. It was created on his behalf by Clerance Lav, a trained medical scribe. The creation of this record is based on the scribe's personal observations and the provider's statements to them. This document has been checked and approved by the attending provider.

## 2019-11-04 ENCOUNTER — Ambulatory Visit
Admission: RE | Admit: 2019-11-04 | Discharge: 2019-11-04 | Disposition: A | Discharge status: Home / Self Care | Payer: Medicare Other | Source: Ambulatory Visit | Attending: Radiation Oncology | Admitting: Radiation Oncology

## 2019-11-04 ENCOUNTER — Other Ambulatory Visit: Payer: Self-pay

## 2019-11-04 ENCOUNTER — Ambulatory Visit: Payer: Medicare Other | Admitting: Radiation Oncology

## 2019-11-04 VITALS — BP 145/84 | HR 89 | Temp 98.9°F | Resp 18 | Ht 65.0 in | Wt 190.2 lb

## 2019-11-04 DIAGNOSIS — C541 Malignant neoplasm of endometrium: Secondary | ICD-10-CM

## 2019-11-04 NOTE — Addendum Note (Signed)
Encounter addended by: De Burrs, RN on: 11/04/2019 4:11 PM  Actions taken: Clinical Note Signed

## 2019-11-04 NOTE — Progress Notes (Signed)
Radiation Oncology         [phone] ________________________________  Name: Cassandra Dodson MRN: [id number]  Date: 11/04/2019  DOB: 1940-10-17  Vaginal Brachytherapy Procedure Note  CC: Shon Baton, MD Lafonda Mosses, MD    ICD-10-CM   1. 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 She was seen in consultation yesterday, 11/03/2019. Given the pathologic findings and her age, she was noted to be at risk for vaginal cuff recurrence and so it was recommended that she proceed with vaginal brachytherapy as adjuvant treatment. The patient was agreeable to this plan.  On review of systems, she reports no vaginal bleeding or pelvic pain. She denies abdominal bloating.  ALLERGIES: is allergic to anesthetics, amide and ether.  Meds: Current Outpatient Medications  Medication Sig Dispense Refill   Accu-Chek FastClix Lancets MISC CHECK BLOOD SUGAR TWICE DAILY     calcium carbonate (CALCIUM 600) 600 MG TABS tablet Take 600 mg by mouth at bedtime.      fenofibrate 160 MG tablet Take 160 mg by mouth daily after breakfast.   2   HUMALOG MIX 75/25 KWIKPEN (75-25) 100 UNIT/ML Kwikpen Inject 24-30 Units into the skin See admin instructions. Inject 30 units subcutaneously in the morning & inject 24 units subcutaneously in the evening.  3   metFORMIN (GLUCOPHAGE) 1000 MG tablet Take 1,000 mg by mouth 2 (two) times daily.   5   Multiple Vitamins-Minerals (MULTIVITAMIN WITH MINERALS) tablet Take 1 tablet by mouth daily. Centrum Silver for Women     senna-docusate (SENOKOT S) 8.6-50 MG tablet Take 1 tablet by mouth at bedtime as needed. (Patient taking differently: Take 1 tablet by mouth at bedtime as needed (constipation.). ) 30 tablet 1   telmisartan  (MICARDIS) 80 MG tablet Take 80 mg by mouth daily after breakfast.      traMADol (ULTRAM) 50 MG tablet Take 1 tablet (50 mg total) by mouth every 6 (six) hours as needed. 20 tablet 0   No current facility-administered medications for this encounter.    Physical Findings: The patient is in no acute distress. Patient is alert and oriented.  height is 5\' 5"  (1.651 m) and weight is 190 lb 4 oz (86.3 kg). Her oral temperature is 98.9 F (37.2 C). Her blood pressure is 145/84 (abnormal) and her pulse is 89. Her respiration is 18 and oxygen saturation is 98%.   No palpable cervical, supraclavicular or axillary lymphoadenopathy. The heart has a regular rate and rhythm. The lungs are clear to auscultation. Abdomen soft and non-tender.  On pelvic examination the external genitalia were unremarkable. A speculum exam was performed. Vaginal cuff intact, no mucosal lesions. On bimanual exam there were no pelvic masses appreciated.  Lab Findings: Lab Results  Component Value Date   WBC 7.2 09/03/2019   HGB 12.4 09/03/2019   HCT 41.1 09/03/2019   MCV 86.9 09/03/2019   PLT 184 09/03/2019    Radiographic Findings: No results found.  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 The patient will be treated with a 2.5 cm diameter cylinder with a treatment length of 3.0 cm. This distended the vaginal vault without undue discomfort. The patient tolerated the procedure well.  The patient was successfully fitted for a  vaginal cylinder. The patient is appropriate to begin vaginal brachytherapy.   Plan: The patient will proceed with CT simulation and her first vaginal brachytherapy treatment today.    _______________________________   Blair Promise, PhD, MD  This document serves as a record of services personally performed by Gery Pray, MD. It was created on his behalf by Clerance Lav, a trained medical  scribe. The creation of this record is based on the scribe's personal observations and the provider's statements to them. This document has been checked and approved by the attending provider.

## 2019-11-04 NOTE — Progress Notes (Signed)
Patient here for pelvic exam with fitting for the SIM/HDR/VCC. Meaningful use done 11/03/2019 at her consult. Patien treport all of her information is the same.

## 2019-11-09 NOTE — Progress Notes (Signed)
  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 She will be treated with a 2.5 cm diameter segmented cylinder. This conforms to her anatomy without undue discomfort.  Vaginal brachytherapy procedure node: The patient was brought to the [place] suite. Identity was confirmed. All relevant records and images related to the planned course of therapy were reviewed. The patient freely provided informed written consent to proceed with treatment after reviewing the details related to the planned course of therapy. The consent form was witnessed and verified by the simulation staff. Then, the patient was set-up in a stable reproducible supine position for radiation therapy. Pelvic exam revealed the vaginal cuff to be intact . The patient's custom vaginal cylinder was placed in the proximal vagina. This was affixed to the CT/MR stabilization plate to prevent slippage. Patient tolerated the placement well.  Verification simulation note:  A fiducial marker was placed within the vaginal cylinder. An AP and lateral film was then obtained through the pelvis area. This documented accurate position of the vaginal cylinder for treatment.  HDR BRACHYTHERAPY TREATMENT  The remote afterloading device was affixed to the vaginal cylinder by catheter. Patient then proceeded to undergo her second high-dose-rate treatment directed at the proximal vagina. The patient was prescribed a dose of 6.0 gray to be delivered to the mucosal surface. Treatment length was 3.0 cm. Patient was treated with 1 channel using 7 dwell positions. Treatment time was 150.9 seconds. Iridium 192 was the  high-dose-rate source for treatment. The patient tolerated the treatment well. After completion of her therapy, a radiation survey was performed documenting return of the iridium source into the GammaMed safe.   PLAN: The patient will return next week for her third high-dose-rate treatment. ________________________________    Blair Promise, PhD, MD  This document serves as a record of services personally performed by Gery Pray, MD. It was created on his behalf by Clerance Lav, a trained medical scribe. The creation of this record is based on the scribe's personal observations and the provider's statements to them. This document has been checked and approved by the attending provider.

## 2019-11-10 ENCOUNTER — Telehealth: Payer: Self-pay | Admitting: *Deleted

## 2019-11-10 NOTE — Telephone Encounter (Signed)
Called patient to remind of [place]. for 11-11-19 @ 2 pm, lvm for a return call

## 2019-11-11 ENCOUNTER — Ambulatory Visit
Admission: RE | Admit: 2019-11-11 | Discharge: 2019-11-11 | Disposition: A | Discharge status: Home / Self Care | Payer: Medicare Other | Source: Ambulatory Visit | Attending: Radiation Oncology | Admitting: Radiation Oncology

## 2019-11-11 ENCOUNTER — Other Ambulatory Visit: Payer: Self-pay

## 2019-11-11 DIAGNOSIS — C541 Malignant neoplasm of endometrium: Secondary | ICD-10-CM | POA: Diagnosis not present

## 2019-11-17 ENCOUNTER — Telehealth: Payer: Self-pay | Admitting: *Deleted

## 2019-11-17 NOTE — Progress Notes (Addendum)
  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 She will be treated with a 2.5 cm diameter segmented cylinder. This conforms to her anatomy without undue discomfort.  Vaginal brachytherapy procedure node: The patient was brought to the [place] suite. Identity was confirmed. All relevant records and images related to the planned course of therapy were reviewed. The patient freely provided informed written consent to proceed with treatment after reviewing the details related to the planned course of therapy. The consent form was witnessed and verified by the simulation staff. Then, the patient was set-up in a stable reproducible supine position for radiation therapy. Pelvic exam revealed the vaginal cuff to be intact . The patient's custom vaginal cylinder was placed in the proximal vagina. This was affixed to the CT/MR stabilization plate to prevent slippage. Patient tolerated the placement well.  Verification simulation note:  A fiducial marker was placed within the vaginal cylinder. An AP and lateral film was then obtained through the pelvis area. This documented accurate position of the vaginal cylinder for treatment.  HDR BRACHYTHERAPY TREATMENT  The remote afterloading device was affixed to the vaginal cylinder by catheter. Patient then proceeded to undergo her third high-dose-rate treatment directed at the proximal vagina. The patient was prescribed a dose of 6.0 gray to be delivered to the mucosal surface. Treatment length was 3.0 cm. Patient was treated with 1 channel using 7 dwell positions. Treatment time was 161.3 seconds. Iridium 192 was the  high-dose-rate source for treatment. The patient tolerated the treatment well. After completion of her therapy, a radiation survey was performed documenting return of the iridium source into the GammaMed safe.   PLAN: The patient will return in approx. 10 days for her fourth high-dose-rate treatment. ________________________________    Blair Promise, PhD, MD  This document serves as a record of services personally performed by Gery Pray, MD. It was created on his behalf by Clerance Lav, a trained medical scribe. The creation of this record is based on the scribe's personal observations and the provider's statements to them. This document has been checked and approved by the attending provider.

## 2019-11-17 NOTE — Telephone Encounter (Signed)
CALLED PATIENT TO REMIND OF HDR [place] 11-18-19 @ 2 PM, LVM FOR A RETURN CALL

## 2019-11-18 ENCOUNTER — Ambulatory Visit
Admission: RE | Admit: 2019-11-18 | Discharge: 2019-11-18 | Disposition: A | Discharge status: Home / Self Care | Payer: Medicare Other | Source: Ambulatory Visit | Attending: Radiation Oncology | Admitting: Radiation Oncology

## 2019-11-18 ENCOUNTER — Other Ambulatory Visit: Payer: Self-pay

## 2019-11-18 DIAGNOSIS — C541 Malignant neoplasm of endometrium: Secondary | ICD-10-CM

## 2019-11-22 NOTE — Progress Notes (Signed)
  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 She will be treated with a 2.5 cm diameter segmented cylinder. This conforms to her anatomy without undue discomfort.  Vaginal brachytherapy procedure node: The patient was brought to the [place] suite. Identity was confirmed. All relevant records and images related to the planned course of therapy were reviewed. The patient freely provided informed written consent to proceed with treatment after reviewing the details related to the planned course of therapy. The consent form was witnessed and verified by the simulation staff. Then, the patient was set-up in a stable reproducible supine position for radiation therapy. Pelvic exam revealed the vaginal cuff to be intact . The patient's custom vaginal cylinder was placed in the proximal vagina. This was affixed to the CT/MR stabilization plate to prevent slippage. Patient tolerated the placement well.  Verification simulation note:  A fiducial marker was placed within the vaginal cylinder. An AP and lateral film was then obtained through the pelvis area. This documented accurate position of the vaginal cylinder for treatment.  HDR BRACHYTHERAPY TREATMENT  The remote afterloading device was affixed to the vaginal cylinder by catheter. Patient then proceeded to undergo her fourth high-dose-rate treatment directed at the proximal vagina. The patient was prescribed a dose of 6.0 gray to be delivered to the mucosal surface. Treatment length was 3.0 cm. Patient was treated with 1 channel using 7 dwell positions. Treatment time was 178.7 seconds. Iridium 192 was the  high-dose-rate source for treatment. The patient tolerated the treatment well. After completion of her therapy, a radiation survey was performed documenting return of the iridium source into the GammaMed safe.   PLAN: The patient will return in two days for her fifth and final high-dose-rate treatment. ________________________________    Blair Promise, PhD, MD  This document serves as a record of services personally performed by Gery Pray, MD. It was created on his behalf by Clerance Lav, a trained medical scribe. The creation of this record is based on the scribe's personal observations and the provider's statements to them. This document has been checked and approved by the attending provider.

## 2019-11-25 ENCOUNTER — Telehealth: Payer: Self-pay | Admitting: *Deleted

## 2019-11-25 NOTE — Telephone Encounter (Signed)
CALLED PATIENT TO REMIND OF HDR [place]. FOR 11-29-19 @ 11 AM, SPOKE WITH PATIENT AND SHE IS AWARE OF THIS [place].

## 2019-11-29 ENCOUNTER — Other Ambulatory Visit: Payer: Self-pay

## 2019-11-29 ENCOUNTER — Ambulatory Visit
Admission: RE | Admit: 2019-11-29 | Discharge: 2019-11-29 | Disposition: A | Discharge status: Home / Self Care | Payer: Medicare Other | Source: Ambulatory Visit | Attending: Radiation Oncology | Admitting: Radiation Oncology

## 2019-11-29 DIAGNOSIS — C541 Malignant neoplasm of endometrium: Secondary | ICD-10-CM | POA: Diagnosis present

## 2019-11-29 NOTE — Progress Notes (Signed)
  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 She will be treated with a 2.5 cm diameter segmented cylinder. This conforms to her anatomy without undue discomfort.  Vaginal brachytherapy procedure node: The patient was brought to the [place] suite. Identity was confirmed. All relevant records and images related to the planned course of therapy were reviewed. The patient freely provided informed written consent to proceed with treatment after reviewing the details related to the planned course of therapy. The consent form was witnessed and verified by the simulation staff. Then, the patient was set-up in a stable reproducible supine position for radiation therapy. Pelvic exam revealed the vaginal cuff to be intact . The patient's custom vaginal cylinder was placed in the proximal vagina. This was affixed to the CT/MR stabilization plate to prevent slippage. Patient tolerated the placement well.  Verification simulation note:  A fiducial marker was placed within the vaginal cylinder. An AP and lateral film was then obtained through the pelvis area. This documented accurate position of the vaginal cylinder for treatment.  HDR BRACHYTHERAPY TREATMENT  The remote afterloading device was affixed to the vaginal cylinder by catheter. Patient then proceeded to undergo her fifth high-dose-rate treatment directed at the proximal vagina. The patient was prescribed a dose of 6.0 gray to be delivered to the mucosal surface. Treatment length was 3.0 cm. Patient was treated with 1 channel using 7 dwell positions. Treatment time was 188.2 seconds. Iridium 192 was the  high-dose-rate source for treatment. The patient tolerated the treatment well. After completion of her therapy, a radiation survey was performed documenting return of the iridium source into the GammaMed safe.   PLAN: The patient will return in one month for routine follow-up. ________________________________    Blair Promise, PhD, MD  This document serves as a record of services personally performed by Gery Pray, MD. It was created on his behalf by Clerance Lav, a trained medical scribe. The creation of this record is based on the scribe's personal observations and the provider's statements to them. This document has been checked and approved by the attending provider.

## 2019-11-30 ENCOUNTER — Telehealth: Payer: Self-pay | Admitting: *Deleted

## 2019-11-30 NOTE — Telephone Encounter (Signed)
CALLED PATIENT TO REMIND OF HDR [place] 12-01-19 @ 2 PM, LVM FOR A RETURN CALL

## 2019-12-01 ENCOUNTER — Ambulatory Visit
Admission: RE | Admit: 2019-12-01 | Discharge: 2019-12-01 | Disposition: A | Discharge status: Home / Self Care | Payer: Medicare Other | Source: Ambulatory Visit | Attending: Radiation Oncology | Admitting: Radiation Oncology

## 2019-12-01 ENCOUNTER — Other Ambulatory Visit: Payer: Self-pay

## 2019-12-01 ENCOUNTER — Encounter: Payer: Self-pay | Admitting: Radiation Oncology

## 2019-12-01 DIAGNOSIS — C541 Malignant neoplasm of endometrium: Secondary | ICD-10-CM | POA: Diagnosis not present

## 2019-12-13 NOTE — Progress Notes (Incomplete)
  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 She did not report any radiation-related side effects.  Plan: The patient will follow-up with radiation oncology in one month.  ________________________________________________   Blair Promise, PhD, MD  This document serves as a record of services personally performed by Gery Pray, MD. It was created on his behalf by Clerance Lav, a trained medical scribe. The creation of this record is based on the scribe's personal observations and the provider's statements to them. This document has been checked and approved by the attending provider.

## 2019-12-28 ENCOUNTER — Telehealth: Payer: Self-pay | Admitting: *Deleted

## 2019-12-28 NOTE — Telephone Encounter (Signed)
CALLED PATIENT TO ALTER FU DUE TO DR. KINARD BEING IN THE OR, RESCHEDULED FOR 01-10-20 @ 10:30 AM, PATIENT AGREED TO NEW APPT. DATE AND TIME

## 2020-01-06 ENCOUNTER — Ambulatory Visit: Payer: Self-pay | Admitting: Radiation Oncology

## 2020-01-10 ENCOUNTER — Telehealth: Payer: Self-pay | Admitting: *Deleted

## 2020-01-10 ENCOUNTER — Ambulatory Visit
Admission: RE | Admit: 2020-01-10 | Discharge: 2020-01-10 | Disposition: A | Discharge status: Home / Self Care | Payer: Medicare Other | Source: Ambulatory Visit | Attending: Radiation Oncology | Admitting: Radiation Oncology

## 2020-01-10 NOTE — Telephone Encounter (Signed)
CALLED PATIENT TO RESCHEDULE FU APPT., RESCHEDULED FOR 01-24-20 @ 4 PM, PATIENT AGREED TO NEW DATE AND TIME

## 2020-01-23 NOTE — Progress Notes (Signed)
Radiation Oncology         [phone] ________________________________  Name: Cassandra Dodson MRN: [id number]  Date: 01/24/2020  DOB: 1940/06/26  Follow-Up Visit Note  CC: Cassandra Baton, MD  Cassandra Mosses, MD    ICD-10-CM   1. 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  Radiation Treatment Dates: 11/04/2019 through 12/01/2019 Site Technique Total Dose (Gy) Dose per Fx (Gy) Completed Fx Beam Energies  Vagina: Pelvis HDR-brachy 30/30 6 5/5 Ir-192   Narrative:  The patient returns today for routine follow-up. No significant interval history since the end of treatment.   On review of systems, she reports overall feeling well. She denies pelvic pain vaginal bleeding, urination difficulties or bowel  complaints.  Patient has a history of irregular bowel movements which is not a new issue for her..                ALLERGIES:  is allergic to anesthetics, amide and ether.  Meds: Current Outpatient Medications  Medication Sig Dispense Refill   calcium carbonate (OSCAL) 1500 (600 Ca) MG TABS tablet Take by mouth 2 (two) times daily with a meal.     fenofibrate 160 MG tablet Take 160 mg by mouth daily after breakfast.   2   HUMALOG MIX 75/25 KWIKPEN (75-25) 100 UNIT/ML Kwikpen Inject 24-30 Units into the skin See admin instructions. Inject 30 units subcutaneously in the morning & inject 24 units subcutaneously in the evening.  3   metFORMIN (GLUCOPHAGE) 1000 MG tablet Take 1,000 mg by mouth 2 (two) times daily.   5   Multiple Vitamins-Minerals (MULTIVITAMIN WITH MINERALS) tablet Take 1 tablet by mouth daily. Centrum Silver for Women     telmisartan (MICARDIS) 80 MG tablet Take 80 mg by mouth daily after breakfast.      Accu-Chek FastClix Lancets MISC CHECK BLOOD SUGAR TWICE DAILY     senna-docusate (SENOKOT S) 8.6-50 MG tablet  Take 1 tablet by mouth at bedtime as needed. (Patient taking differently: Take 1 tablet by mouth at bedtime as needed (constipation.). ) 30 tablet 1   No current facility-administered medications for this encounter.    Physical Findings: The patient is in no acute distress. Patient is alert and oriented.  height is 5\' 5"  (1.651 m) and weight is 193 lb (87.5 kg). Her tympanic temperature is 96.4 F (35.8 C) (abnormal). Her blood pressure is 186/95 (abnormal) and her pulse is 95. Her respiration is 18 and oxygen saturation is 100%.   Lungs are clear to auscultation bilaterally. Heart has regular rate and rhythm. No palpable cervical, supraclavicular, or axillary adenopathy. Abdomen soft, non-tender, normal bowel sounds. Pelvic exam deferred in light of recent treatment completion.   Lab Findings: Lab Results  Component Value Date   WBC 7.2 09/03/2019   HGB 12.4 09/03/2019   HCT 41.1 09/03/2019   MCV 86.9 09/03/2019   PLT 184 09/03/2019    Radiographic Findings: No results found.  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  Plan: The patient is scheduled to follow-up with Dr. Berline Dodson on 04/05/2020. She will follow-up with radiation oncology in six months.  Today the patient was given a vaginal dilator and instructions on its use.  Total time spent  in this encounter was 15 minutes which included reviewing the patient's most recent interval history, physical examination, and documentation. ____________________________________   Cassandra Promise, PhD, MD  This document serves as a record of services personally performed by Cassandra Pray, MD. It was created on his behalf by Cassandra Dodson, a trained medical scribe. The creation of this record is based on the scribe's personal observations and the provider's statements to them. This  document has been checked and approved by the attending provider.

## 2020-01-24 ENCOUNTER — Encounter: Payer: Self-pay | Admitting: Radiation Oncology

## 2020-01-24 ENCOUNTER — Ambulatory Visit
Admission: RE | Admit: 2020-01-24 | Discharge: 2020-01-24 | Disposition: A | Discharge status: Home / Self Care | Payer: Medicare Other | Source: Ambulatory Visit | Attending: Radiation Oncology | Admitting: Radiation Oncology

## 2020-01-24 ENCOUNTER — Other Ambulatory Visit: Payer: Self-pay

## 2020-01-24 VITALS — BP 186/95 | HR 95 | Temp 96.4°F | Resp 18 | Ht 65.0 in | Wt 193.0 lb

## 2020-01-24 DIAGNOSIS — C541 Malignant neoplasm of endometrium: Secondary | ICD-10-CM

## 2020-01-24 NOTE — Progress Notes (Addendum)
Patient here for a 1 month f/u visit with Dr. Sondra Come. Patient denies pain or vaginal bleeding. She denies bladder or bowel problems. She has a hx of irregular bowel movements.  BP (!) 186/95 (BP Location: Left Arm, Patient Position: Sitting)   Pulse 95   Temp (!) 96.4 F (35.8 C) (Tympanic)   Resp 18   Ht 5\' 5"  (1.651 m)   Wt 193 lb (87.5 kg)   SpO2 100%   BMI 32.12 kg/m   Wt Readings from Last 3 Encounters:  01/24/20 193 lb (87.5 kg)  11/04/19 190 lb 4 oz (86.3 kg)  11/03/19 193 lb (87.5 kg)     Home Care Instructions for the Insertion and Care of Your Vaginal Dilator  Why Do I Need a Vaginal Dilator?  Internal radiation therapy may cause scar tissue to form at the top of your vagina (vaginal cuff).  This may make vaginal examinations difficult in the future. You can prevent scar tissue from forming by using a vaginal dilator (a smooth plastic rod), and/or by having regular sexual intercourse.  If not using the dilator you should be having intercourse two or three times a week.  If you are unable to have intercourse, you should use your vaginal dilator.  You may have some spotting or bleeding from your dilator or intercourse the first few times. You may also have some discomfort. If discomfort occurs with intercourse, you and your partner may need to stop for a while and try again later.  How to Use Your Vaginal Dilator  - Wash the dilator with soap and water before and after each use. - Check the dilator to be sure it is smooth. Do not use the dilator if you find any roughspots. - Coat the dilator with K-Y Jelly, Astroglide, or Replens. Do not use Vaseline, baby oil, or other oil based lubricants. They are not water-soluble and can be irritating to the tissues in the vagina. - Lie on your back with your knees bent and legs apart. - Insert the rounded end of the dilator into your vagina as far as it will go without causing pain or discomfort. - Close your knees and slowly  straighten your legs. - Keep the dilator in your vagina for about 10 to 15 minutes.  Please use 3 times a week, for example: Monday, Wednesday and Friday evenings. [facility] your knees, open your legs, and gently remove the dilator. - Gently cleanse the skin around the vaginal opening. - Wash the dilator after each use. -  It is important that you use the dilator routinely until instructed otherwise by your doctor.  Patient given verbal and written instructions of the above. Patient provided with a sx+ and a s dilator.

## 2020-01-24 NOTE — Addendum Note (Signed)
Encounter addended by: De Burrs, RN on: 01/24/2020 6:15 PM  Actions taken: Clinical Note Signed

## 2020-01-25 ENCOUNTER — Telehealth: Payer: Self-pay | Admitting: *Deleted

## 2020-01-25 NOTE — Telephone Encounter (Signed)
CALLED PATIENT TO INFORM OF FU WITH DR. Berline Lopes FOR 03-2020 AND WITH DR. KINARD FOR 06-26-20, LVM FOR A RETURN CALL

## 2020-02-10 ENCOUNTER — Telehealth: Payer: Self-pay

## 2020-02-10 NOTE — Telephone Encounter (Signed)
Call returned to patient asking if she needed to use the dilator and lubricant until she returns in March to see Dr. Sondra Come. Patient advised yes and explained to her why she needed to continue. Patient states after she had surgery and radiation that she thought that was the end of her treatment and did not know it went on and on. Patient advised that her f/u is a process and the MD wants to ensure it does not reoccur. Patient states she will think about this and discuss this with Dr. Sondra Come in March as she thinks at age  79 there should be an end to all of this. Patient advised will pass the information on to Dr. Sondra Come and to feel free to call any time with concerns.

## 2020-02-23 ENCOUNTER — Telehealth: Payer: Self-pay

## 2020-02-23 NOTE — Telephone Encounter (Signed)
1151am Returned patient's call about needing more lubricant for her dilator use. VM left of options of lubricant to purchase and to call back if has questions. Contact information provided.

## 2020-03-23 IMAGING — DX DG CHEST 2V
2 series · 2 of 2 positions shown · non-contrast
Comparison: Chest x-ray 02/23/2015.

CLINICAL DATA: 79-year-old female with history of shortness of
breath and weakness.

EXAM:
CHEST - 2 VIEW

[chest pa]
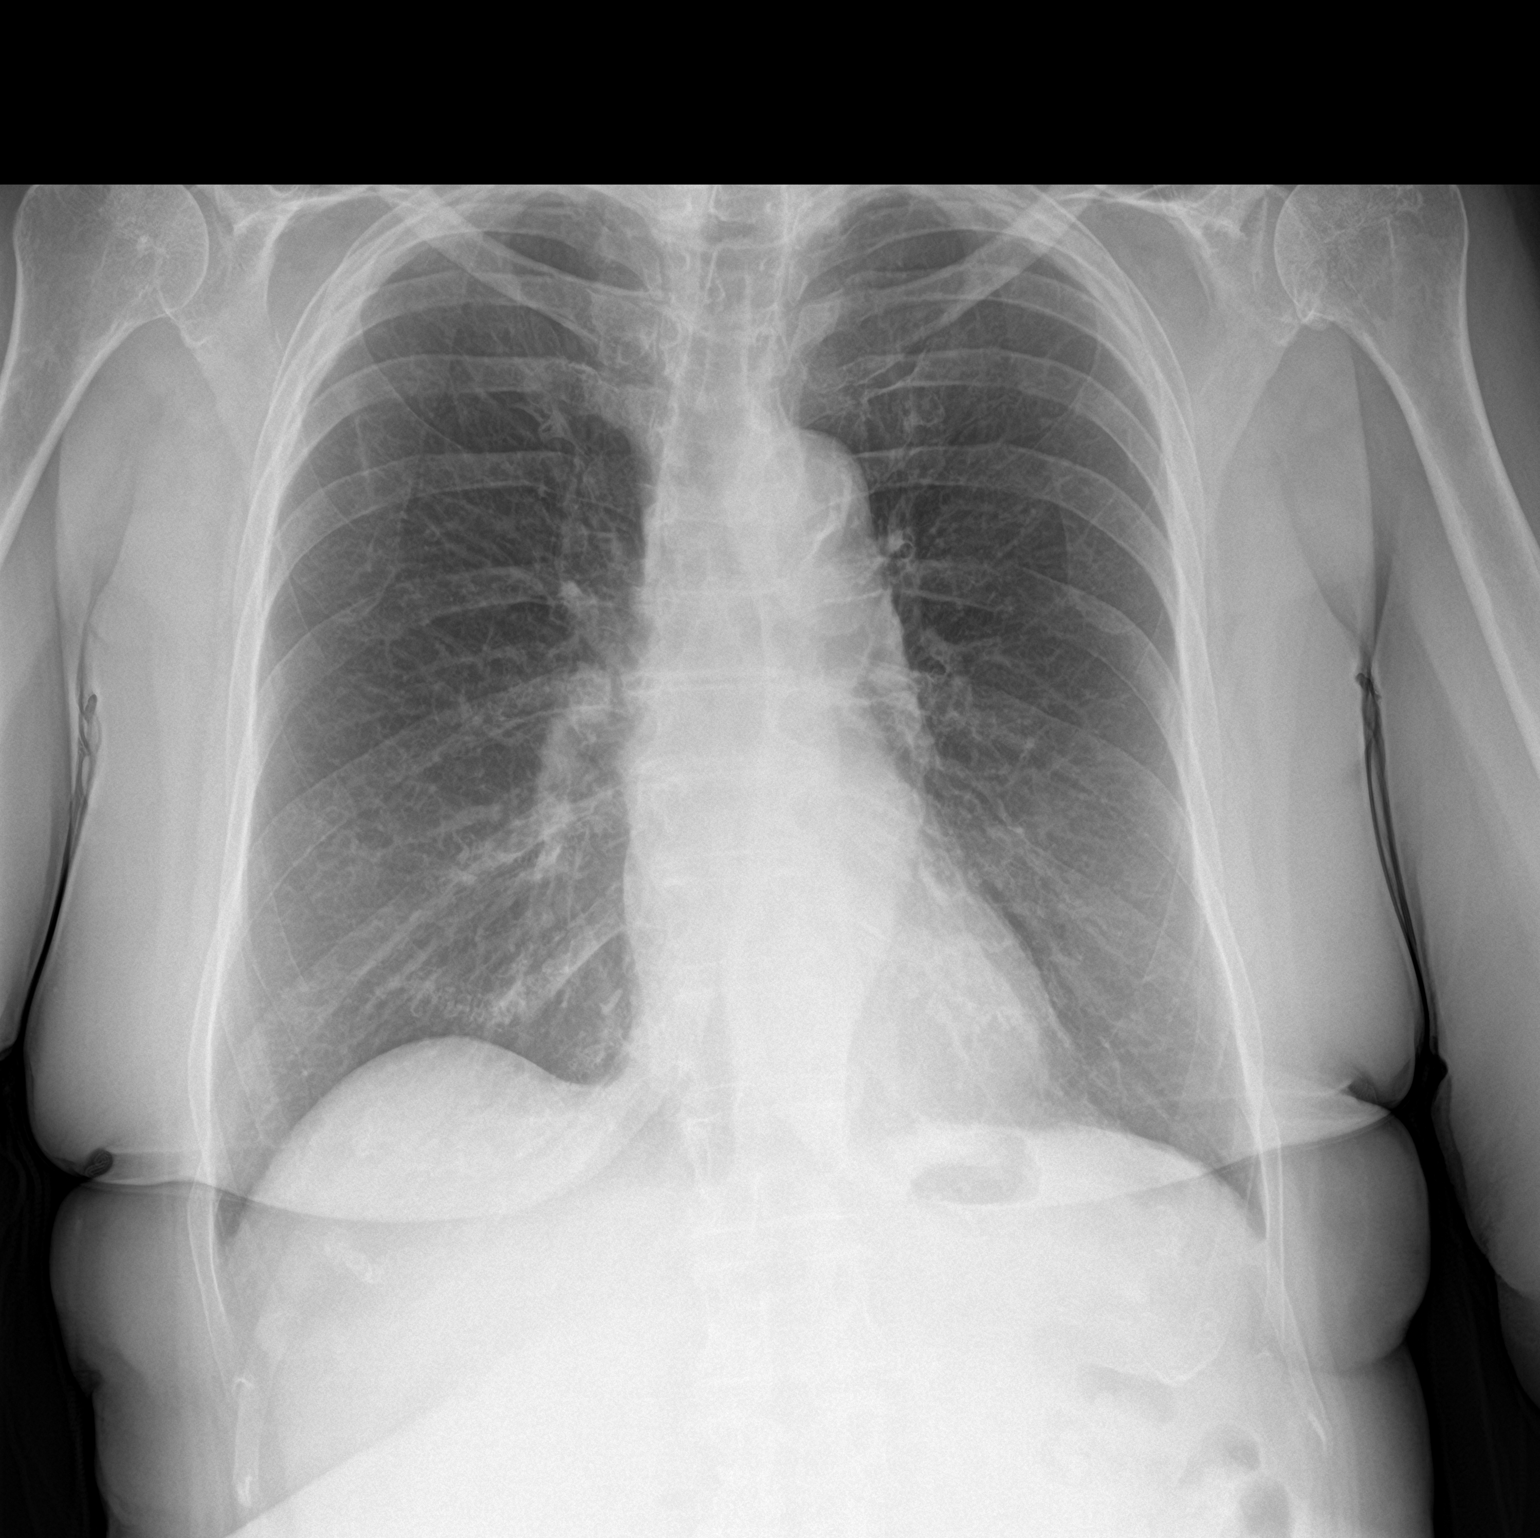

[chest lat]
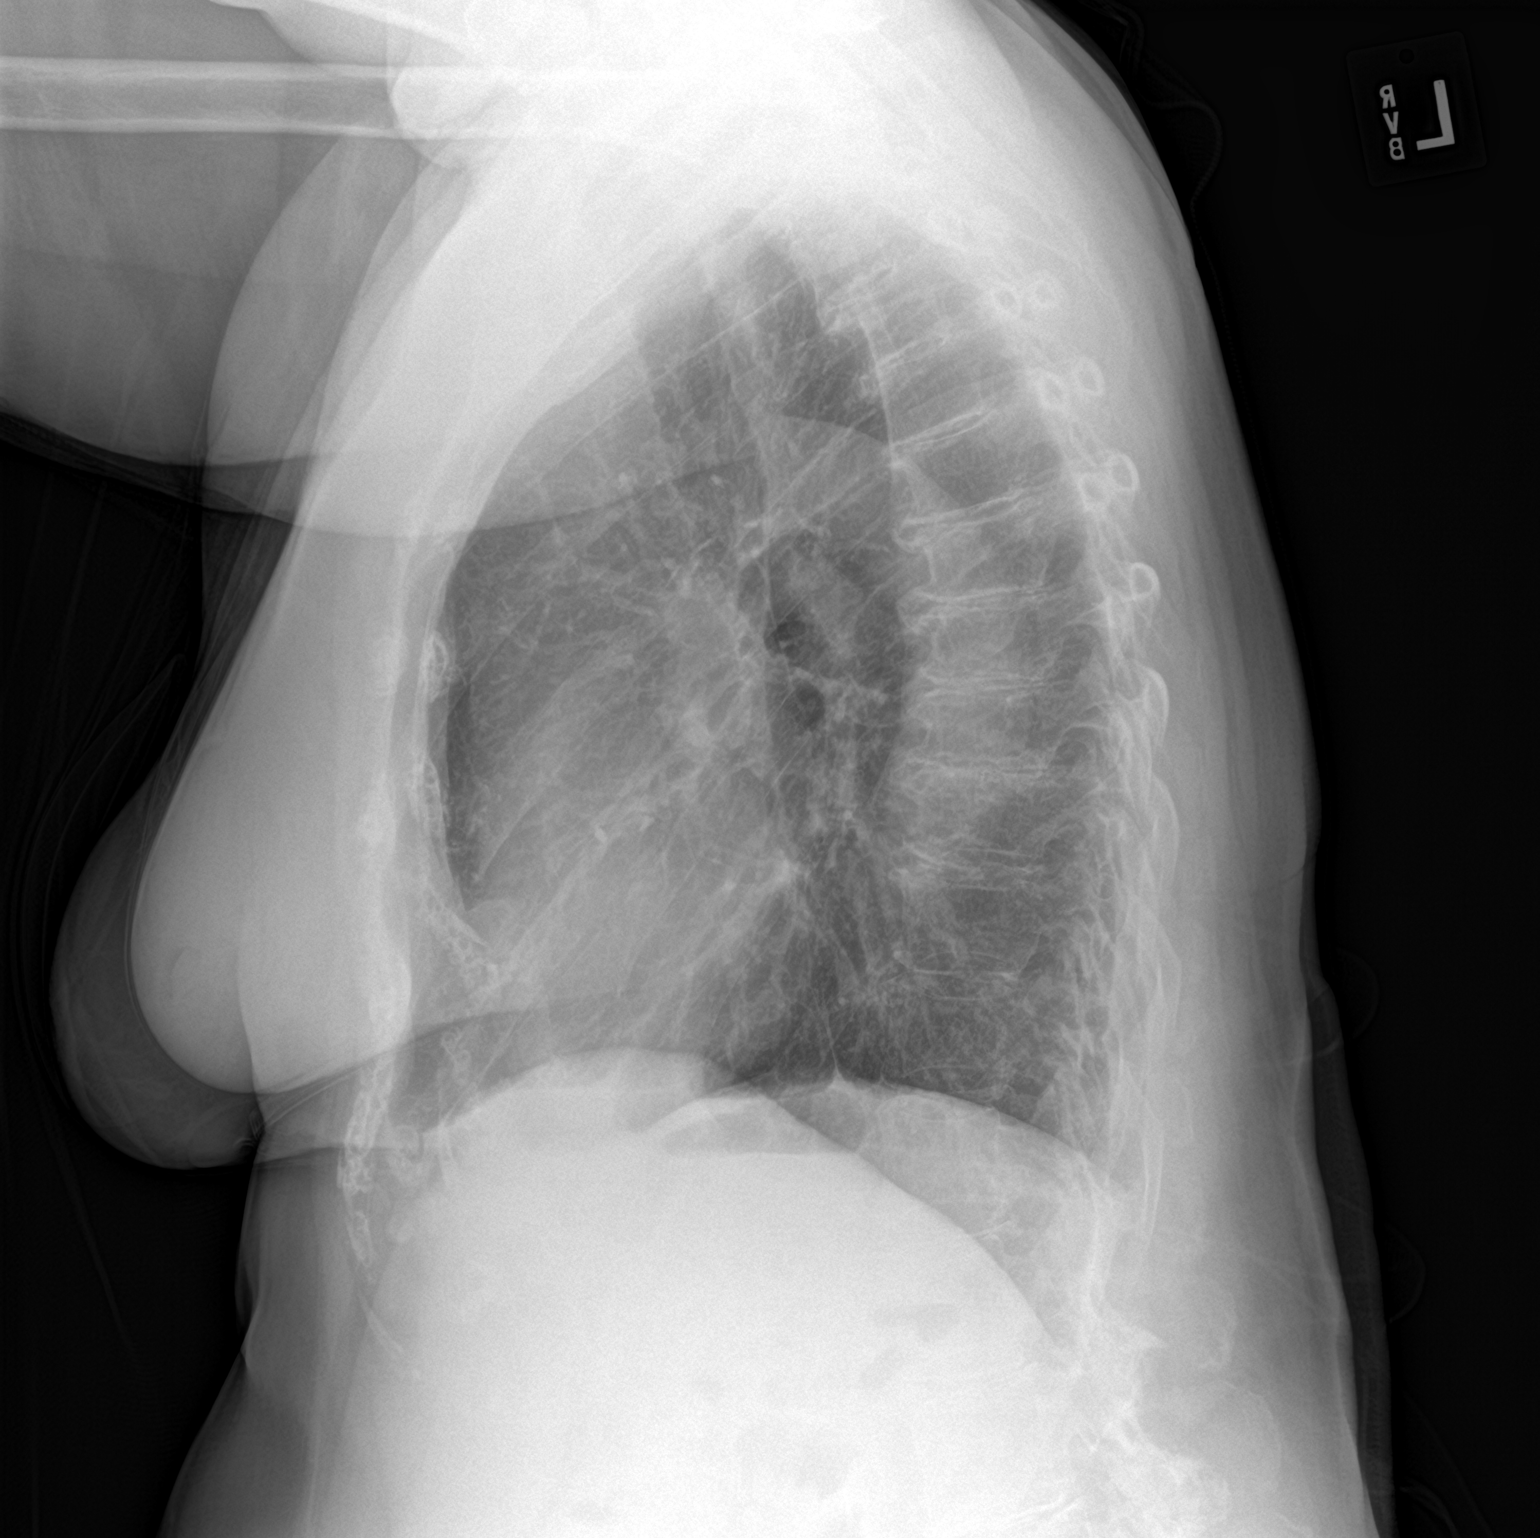

[2 of 2 positions shown; findings below may reference images not displayed]

FINDINGS: Lung volumes are normal. No consolidative airspace disease. No
pleural effusions. No pneumothorax. No pulmonary nodule or mass
noted. Pulmonary vasculature and the cardiomediastinal silhouette
are within normal limits. Atherosclerosis in the thoracic aorta.
IMPRESSION: 1.  No radiographic evidence of acute cardiopulmonary disease.
2. Aortic atherosclerosis.

## 2020-04-05 ENCOUNTER — Encounter: Payer: Self-pay | Admitting: Gynecologic Oncology

## 2020-04-05 ENCOUNTER — Inpatient Hospital Stay: Payer: Medicare Other | Attending: Gynecologic Oncology | Admitting: Gynecologic Oncology

## 2020-04-05 ENCOUNTER — Encounter: Payer: Medicare Other | Admitting: Gynecologic Oncology

## 2020-04-05 ENCOUNTER — Other Ambulatory Visit: Payer: Self-pay

## 2020-04-05 VITALS — BP 139/68 | HR 88 | Temp 97.0°F | Resp 16 | Ht 65.0 in | Wt 195.4 lb

## 2020-04-05 DIAGNOSIS — M1990 Unspecified osteoarthritis, unspecified site: Secondary | ICD-10-CM | POA: Insufficient documentation

## 2020-04-05 DIAGNOSIS — Z8542 Personal history of malignant neoplasm of other parts of uterus: Secondary | ICD-10-CM | POA: Diagnosis present

## 2020-04-05 DIAGNOSIS — Z90722 Acquired absence of ovaries, bilateral: Secondary | ICD-10-CM | POA: Insufficient documentation

## 2020-04-05 DIAGNOSIS — Z87891 Personal history of nicotine dependence: Secondary | ICD-10-CM | POA: Insufficient documentation

## 2020-04-05 DIAGNOSIS — Z08 Encounter for follow-up examination after completed treatment for malignant neoplasm: Secondary | ICD-10-CM | POA: Diagnosis present

## 2020-04-05 DIAGNOSIS — Z923 Personal history of irradiation: Secondary | ICD-10-CM | POA: Insufficient documentation

## 2020-04-05 DIAGNOSIS — R112 Nausea with vomiting, unspecified: Secondary | ICD-10-CM | POA: Insufficient documentation

## 2020-04-05 DIAGNOSIS — E1122 Type 2 diabetes mellitus with diabetic chronic kidney disease: Secondary | ICD-10-CM | POA: Diagnosis not present

## 2020-04-05 DIAGNOSIS — Z79899 Other long term (current) drug therapy: Secondary | ICD-10-CM | POA: Diagnosis not present

## 2020-04-05 DIAGNOSIS — E039 Hypothyroidism, unspecified: Secondary | ICD-10-CM | POA: Diagnosis not present

## 2020-04-05 DIAGNOSIS — Z90710 Acquired absence of both cervix and uterus: Secondary | ICD-10-CM | POA: Insufficient documentation

## 2020-04-05 DIAGNOSIS — Z7984 Long term (current) use of oral hypoglycemic drugs: Secondary | ICD-10-CM | POA: Insufficient documentation

## 2020-04-05 DIAGNOSIS — C541 Malignant neoplasm of endometrium: Secondary | ICD-10-CM

## 2020-04-05 DIAGNOSIS — G709 Myoneural disorder, unspecified: Secondary | ICD-10-CM | POA: Insufficient documentation

## 2020-04-05 DIAGNOSIS — I129 Hypertensive chronic kidney disease with stage 1 through stage 4 chronic kidney disease, or unspecified chronic kidney disease: Secondary | ICD-10-CM | POA: Diagnosis not present

## 2020-04-05 DIAGNOSIS — K219 Gastro-esophageal reflux disease without esophagitis: Secondary | ICD-10-CM | POA: Diagnosis not present

## 2020-04-05 DIAGNOSIS — N183 Chronic kidney disease, stage 3 unspecified: Secondary | ICD-10-CM | POA: Diagnosis not present

## 2020-04-05 NOTE — Patient Instructions (Signed)
Everything looks normal on your exam today!  No evidence that the cancer has come back.  Please work on using the vaginal dilator at least a couple of times a week.  If you develop any new symptoms before your next visit with me, such as vaginal bleeding, pelvic pain, or weight loss, please call the clinic to see me sooner at [phone].

## 2020-04-05 NOTE — Progress Notes (Signed)
Gynecologic Oncology Return Clinic Visit  04/05/2020  Reason for Visit: Surveillance visit in the setting of early stage endometrial cancer  Treatment History: Oncology History Overview Note  Surgery initially scheduled for early March 2021, delayed given uncontrolled diabetes. Mirena IUD placed instead  IHC MMR intact, MSI-stable   Endometrial cancer ([place])  03/30/2019 Imaging   CT A/P: No evidence of metastatic disease   04/27/2019 Initial Biopsy   EMB - Gr 2 EMCA   04/27/2019 Imaging   TVUS - 1.7cm endometrial lining, simple left ovarian cyst   04/27/2019 Initial Diagnosis   Endometrial cancer ([place])   06/24/2019 Surgery   D&C, Mirena IUD placement, vaginal and cervical biopsies Grade 2 endometrioid cancer Vaginal and cervical biopsies benign   09/14/2019 Surgery   Robotic-assisted laparoscopic total hysterectomy with bilateral salpingoophorectomy, SLN biopsy   On EUA, small mobile uterus, narrow introitus. ON intra-abdominal entry, normal upper abdominal survey. Normal appearing omentum, small and large bowel. Uterus 6-8 cm and normal appearing. Bilateral adnexa normal appearing with 2cm left para-ovarian hemorrhagic appearing cyst. Some retroperitoneal fibrosis on the left. Adhesions between the bladder and cervix. Mapping successful bilaterally: on the left, to deep obturator SLN. On right, mapping along two channels in the pelvic basins with no lymph nodes identified. Two channels ultimately terminated in a low right para-aortic lymph node although the node itself was not green.   09/14/2019 Pathologic Stage   Stage IA, gr 2, no LVSI, neg SLNs    11/04/2019 - 12/01/2019 Radiation Therapy   Radiation Treatment Dates: 11/04/2019 through 12/01/2019 Site Technique Total Dose (Gy) Dose per Fx (Gy) Completed Fx Beam Energies  Vagina: Pelvis HDR-brachy 30/30 6 5/5 Ir-192        Interval History: Patient saw Dr. Sondra Come on 10/4 for follow-up after completing radiation.  She was doing  well at that time.  Notes doing well since treatment.  Has found it difficult to remember using vaginal dilator and does not enjoy doing this.  Denies any vaginal bleeding or discharge.  Reports a good appetite; has occasional nausea and emesis at baseline for her.  Reports baseline bowel and bladder function.  Past Medical/Surgical History: Past Medical History:  Diagnosis Date  . Arthritis   . CKD (chronic kidney disease), stage III ([place])    patient denies  . Depression   . Diabetes mellitus without complication ([place])   . Difficult intravenous access   . Endometrial cancer ([place])   . GERD (gastroesophageal reflux disease)   . Headache   . Hypertension   . Hypothyroidism   . Neuromuscular disorder ([facility])    neuropathy in feet  . Osteoarthritis   . PMB (postmenopausal bleeding)   . PONV (postoperative nausea and vomiting)    severe nausea and vomiting after knee replacement 06-2014, did ok with 2018 knee replacement  . Urinary frequency   . Wears glasses     Past Surgical History:  Procedure Laterality Date  . BREAST SURGERY     cyst removed  . CESAREAN SECTION    . DILATION AND CURETTAGE OF UTERUS N/A 06/24/2019   Procedure: DILATATION AND CURETTAGE;  Surgeon: Lafonda Mosses, MD;  Location: [facility];  Service: Gynecology;  Laterality: N/A;  . FRACTURE SURGERY     right knee  . INTRAUTERINE DEVICE (IUD) INSERTION N/A 06/24/2019   Procedure: INTRAUTERINE DEVICE (IUD) INSERTION MIRENA;  Surgeon: Lafonda Mosses, MD;  Location: [facility];  Service: Gynecology;  Laterality: N/A;  . IRRIGATION AND  DEBRIDEMENT SEBACEOUS CYST    . JOINT REPLACEMENT Right    right  . LYMPH NODE DISSECTION N/A 09/14/2019   Procedure: LYMPH NODE DISSECTION;  Surgeon: Lafonda Mosses, MD;  Location: [facility];  Service: Gynecology;  Laterality: N/A;  . ROBOTIC ASSISTED TOTAL HYSTERECTOMY WITH BILATERAL SALPINGO OOPHERECTOMY Bilateral 09/14/2019   Procedure: XI  ROBOTIC ASSISTED TOTAL HYSTERECTOMY WITH BILATERAL SALPINGO OOPHORECTOMY;  Surgeon: Lafonda Mosses, MD;  Location: [facility];  Service: Gynecology;  Laterality: Bilateral;  . SENTINEL NODE BIOPSY N/A 09/14/2019   Procedure: SENTINEL NODE BIOPSY;  Surgeon: Lafonda Mosses, MD;  Location: [facility];  Service: Gynecology;  Laterality: N/A;  . teeth extration    . TONSILLECTOMY    . TOTAL HIP ARTHROPLASTY Right 02/23/2015   Procedure: RIGHT TOTAL HIP ARTHROPLASTY ANTERIOR APPROACH;  Surgeon: Leandrew Koyanagi, MD;  Location: [facility];  Service: Orthopedics;  Laterality: Right;  . TOTAL KNEE ARTHROPLASTY Left 06/27/2016   Procedure: LEFT TOTAL KNEE ARTHROPLASTY;  Surgeon: Leandrew Koyanagi, MD;  Location: [facility];  Service: Orthopedics;  Laterality: Left;    Family History  Problem Relation Age of Onset  . Pancreatic cancer Mother   . Stroke Father   . Hypertension Father     Social History   Socioeconomic History  . Marital status: Widowed    Spouse name: Not on file  . Number of children: Not on file  . Years of education: Not on file  . Highest education level: Not on file  Occupational History  . Not on file  Tobacco Use  . Smoking status: Former Smoker    Packs/day: 1.50    Years: 45.00    Pack years: 67.50    Types: Cigarettes    Quit date: 04/22/2002    Years since quitting: 17.9  . Smokeless tobacco: Never Used  Vaping Use  . Vaping Use: Never used  Substance and Sexual Activity  . Alcohol use: Yes    Comment: rarely  . Drug use: No  . Sexual activity: Not on file  Other Topics Concern  . Not on file  Social History Narrative  . Not on file   Social Determinants of Health   Financial Resource Strain: Not on file  Food Insecurity: Not on file  Transportation Needs: Not on file  Physical Activity: Not on file  Stress: Not on file  Social Connections: Not on file    Current Medications:  Current Outpatient Medications:  .  Accu-Chek FastClix Lancets MISC, CHECK BLOOD SUGAR  TWICE DAILY, Disp: , Rfl:  .  calcium carbonate (OSCAL) 1500 (600 Ca) MG TABS tablet, Take by mouth 2 (two) times daily with a meal., Disp: , Rfl:  .  fenofibrate 160 MG tablet, Take 160 mg by mouth daily after breakfast. , Disp: , Rfl: 2 .  HUMALOG MIX 75/25 KWIKPEN (75-25) 100 UNIT/ML Kwikpen, Inject 24-30 Units into the skin See admin instructions. Inject 30 units subcutaneously in the morning & inject 24 units subcutaneously in the evening., Disp: , Rfl: 3 .  metFORMIN (GLUCOPHAGE) 1000 MG tablet, Take 1,000 mg by mouth 2 (two) times daily. , Disp: , Rfl: 5 .  Multiple Vitamins-Minerals (MULTIVITAMIN WITH MINERALS) tablet, Take 1 tablet by mouth daily. Centrum Silver for Women, Disp: , Rfl:  .  telmisartan (MICARDIS) 80 MG tablet, Take 80 mg by mouth daily after breakfast. , Disp: , Rfl:   Review of Systems: Reports fatigue, hearing loss, nausea, emesis, urinary frequency, dizziness, difficulty walking. Denies  appetite changes, fevers, chills, unexplained weight changes. Denies neck lumps or masses, mouth sores, ringing in ears or voice changes. Denies cough or wheezing.  Denies shortness of breath. Denies chest pain or palpitations. Denies leg swelling. Denies abdominal distention, pain, blood in stools, constipation, diarrhea, or early satiety. Denies pain with intercourse, dysuria, hematuria or incontinence. Denies hot flashes, pelvic pain, vaginal bleeding or vaginal discharge.   Denies joint pain, back pain or muscle pain/cramps. Denies itching, rash, or wounds. Denies headaches, numbness or seizures. Denies swollen lymph nodes or glands, denies easy bruising or bleeding. Denies anxiety, depression, confusion, or decreased concentration.  Physical Exam: BP 139/68 (BP Location: Left Arm, Patient Position: Sitting)   Pulse 88   Temp (!) 97 F (36.1 C) (Tympanic)   Resp 16   Ht 5' 5"  (1.651 m)   Wt 195 lb 6.4 oz (88.6 kg)   SpO2 98%   BMI 32.52 kg/m  General: Alert, oriented,  no acute distress.  Moderately hard of hearing. HEENT: Normocephalic, atraumatic, sclera anicteric. Chest: Unlabored breathing on room air. Abdomen: Obese, soft, nontender.  Normoactive bowel sounds.  No masses or hepatosplenomegaly appreciated.  Well-healed incisions. Extremities: Grossly normal range of motion.  Warm, well perfused.  Trace edema bilaterally. Skin: No rashes or lesions noted. Lymphatics: No cervical, supraclavicular, or inguinal adenopathy. GU: Normal appearing external genitalia without erythema, excoriation, or lesions.  Speculum exam reveals mildly atrophic vaginal mucosa, cuff intact, no lesions, bleeding or discharge.  Bimanual exam reveals cuff intact, no nodularity or masses.  Rectovaginal exam confirms these findings.  Laboratory & Radiologic Studies: None new  Assessment & Plan: Cassandra Dodson is a 79 y.o. woman with Stage IA grade 2 endometrial cancer who presents for surveillance visit.  Patient is doing well and is NED on exam today.  We discussed the importance and reason for vaginal dilator use.  She will try to do this more frequently.    Per [facility] surveillance recommendations, discussed plan for visits every 3-6 months for the first year and then every 6-12 months until 5 years out.  We also reviewed again signs and symptoms that would be concerning for recurrence and that they should prompt a phone call to clinic to be seen prior to her next scheduled visit.  25 minutes of total time was spent for this patient encounter, including preparation, face-to-face counseling with the patient and coordination of care, and documentation of the encounter.  Jeral Pinch, MD  Division of Gynecologic Oncology  Department of Obstetrics and Gynecology  [facility]

## 2020-06-22 ENCOUNTER — Encounter: Payer: Self-pay | Admitting: *Deleted

## 2020-06-26 ENCOUNTER — Ambulatory Visit
Admission: RE | Admit: 2020-06-26 | Discharge: 2020-06-26 | Disposition: A | Discharge status: Home / Self Care | Payer: Medicare Other | Source: Ambulatory Visit | Attending: Radiation Oncology | Admitting: Radiation Oncology

## 2020-06-26 ENCOUNTER — Other Ambulatory Visit: Payer: Self-pay

## 2020-06-26 ENCOUNTER — Encounter: Payer: Self-pay | Admitting: Radiation Oncology

## 2020-06-26 VITALS — BP 165/83 | HR 86 | Temp 97.0°F | Resp 18 | Ht 65.0 in | Wt 195.1 lb

## 2020-06-26 DIAGNOSIS — Z7984 Long term (current) use of oral hypoglycemic drugs: Secondary | ICD-10-CM | POA: Insufficient documentation

## 2020-06-26 DIAGNOSIS — C541 Malignant neoplasm of endometrium: Secondary | ICD-10-CM

## 2020-06-26 DIAGNOSIS — Z8542 Personal history of malignant neoplasm of other parts of uterus: Secondary | ICD-10-CM | POA: Diagnosis present

## 2020-06-26 DIAGNOSIS — Z923 Personal history of irradiation: Secondary | ICD-10-CM | POA: Insufficient documentation

## 2020-06-26 NOTE — Progress Notes (Signed)
Radiation Oncology         [phone] ________________________________  Name: JAEDYNN BOHLKEN MRN: [id number]  Date: 06/26/2020  DOB: 1940-12-15  Follow-Up Visit Note  CC: Shon Baton, MD  Lafonda Mosses, MD    ICD-10-CM   1. 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 She was last seen by Dr. Berline Lopes on 04/05/2020, at which time there was no evidence of disease on exam.  On review of systems, she reports no new medical issues. She denies vaginal bleeding but does have some occasional vaginal discharge after using her dilator.  She denies any pelvic pain or abdominal bloating.  She denies any hematuria or rectal bleeding..      ALLERGIES:  is allergic to anesthetics, amide and ether.  Meds: Current Outpatient Medications  Medication Sig Dispense Refill  . Accu-Chek FastClix Lancets MISC CHECK BLOOD SUGAR TWICE DAILY    . calcium carbonate (OSCAL) 1500 (600 Ca) MG TABS tablet Take by mouth 2 (two) times daily with a meal.    . fenofibrate 160 MG tablet Take 160 mg by mouth daily after breakfast.   2  . HUMALOG MIX 75/25 KWIKPEN (75-25) 100 UNIT/ML Kwikpen Inject 24-30 Units into the skin See admin instructions. Inject 30 units subcutaneously in the morning & inject 24 units subcutaneously in the evening.  3  . metFORMIN (GLUCOPHAGE) 1000 MG tablet Take 1,000 mg by mouth 2 (two) times daily.   5  . Multiple Vitamins-Minerals (MULTIVITAMIN WITH MINERALS) tablet Take 1 tablet by mouth daily. Centrum Silver for Women    . telmisartan (MICARDIS) 80 MG tablet Take 80 mg by mouth daily after  breakfast.      No current facility-administered medications for this encounter.    Physical Findings: The patient is in no acute distress. Patient is alert and oriented.  height is 5\' 5"  (1.651 m) and weight is 195 lb 2 oz (88.5 kg). Her temporal temperature is 97 F (36.1 C) (abnormal). Her blood pressure is 165/83 (abnormal) and her pulse is 86. Her respiration is 18 and oxygen saturation is 97%.   Lungs are clear to auscultation bilaterally. Heart has regular rate and rhythm. No palpable cervical, supraclavicular, or axillary adenopathy. Abdomen soft, non-tender, normal bowel sounds.  On pelvic examination the external genitalia were unremarkable. A speculum exam was performed. There are no mucosal lesions noted in the vaginal vault. On bimanual and rectovaginal examination there were no pelvic masses appreciated.   Lab Findings: Lab Results  Component Value Date   WBC 7.2 09/03/2019   HGB 12.4 09/03/2019   HCT 41.1 09/03/2019   MCV 86.9 09/03/2019   PLT 184 09/03/2019    Radiographic Findings: No results found.  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  She has resumed using the vaginal dilator and I recommended she continue using this as recommended.  Plan: The patient is scheduled to follow up with Dr. Berline Lopes on 10/04/2020. She will follow up with radiation oncology in six months.  Total time spent in this encounter was 20  minutes which included reviewing the patient's most recent follow-up with Dr. Berline Lopes, physical examination, and documentation. ____________________________________   Blair Promise, PhD, MD  This document serves as a record of services personally performed by Gery Pray, MD. It was created on his behalf by Clerance Lav, a trained medical scribe. The creation of this record is based on the scribe's personal observations and the provider's statements to them.  This document has been checked and approved by the attending provider.

## 2020-06-26 NOTE — Progress Notes (Signed)
Patient in for follow up. States she is doing well. Has had vaginal discharge and bleeding since last follow up. States she has been using her dilators. BP (!) 165/83 (BP Location: Left Arm, Patient Position: Sitting)   Pulse 86   Temp (!) 97 F (36.1 C) (Temporal)   Resp 18   Ht 5\' 5"  (1.651 m)   Wt 195 lb 2 oz (88.5 kg)   SpO2 97%   BMI 32.47 kg/m

## 2020-10-02 ENCOUNTER — Encounter: Payer: Self-pay | Admitting: Gynecologic Oncology

## 2020-10-04 ENCOUNTER — Encounter: Payer: Self-pay | Admitting: Gynecologic Oncology

## 2020-10-04 ENCOUNTER — Inpatient Hospital Stay: Payer: Medicare Other | Attending: Gynecologic Oncology | Admitting: Gynecologic Oncology

## 2020-10-04 ENCOUNTER — Other Ambulatory Visit: Payer: Self-pay

## 2020-10-04 VITALS — BP 137/99 | HR 92 | Temp 98.5°F | Resp 20 | Wt 197.2 lb

## 2020-10-04 DIAGNOSIS — Z7982 Long term (current) use of aspirin: Secondary | ICD-10-CM | POA: Diagnosis not present

## 2020-10-04 DIAGNOSIS — Z90722 Acquired absence of ovaries, bilateral: Secondary | ICD-10-CM | POA: Insufficient documentation

## 2020-10-04 DIAGNOSIS — I129 Hypertensive chronic kidney disease with stage 1 through stage 4 chronic kidney disease, or unspecified chronic kidney disease: Secondary | ICD-10-CM | POA: Diagnosis not present

## 2020-10-04 DIAGNOSIS — Z923 Personal history of irradiation: Secondary | ICD-10-CM | POA: Insufficient documentation

## 2020-10-04 DIAGNOSIS — Z7984 Long term (current) use of oral hypoglycemic drugs: Secondary | ICD-10-CM | POA: Diagnosis not present

## 2020-10-04 DIAGNOSIS — Z90710 Acquired absence of both cervix and uterus: Secondary | ICD-10-CM | POA: Diagnosis not present

## 2020-10-04 DIAGNOSIS — Z8542 Personal history of malignant neoplasm of other parts of uterus: Secondary | ICD-10-CM | POA: Insufficient documentation

## 2020-10-04 DIAGNOSIS — K219 Gastro-esophageal reflux disease without esophagitis: Secondary | ICD-10-CM | POA: Diagnosis not present

## 2020-10-04 DIAGNOSIS — C541 Malignant neoplasm of endometrium: Secondary | ICD-10-CM

## 2020-10-04 DIAGNOSIS — E039 Hypothyroidism, unspecified: Secondary | ICD-10-CM | POA: Insufficient documentation

## 2020-10-04 DIAGNOSIS — E1122 Type 2 diabetes mellitus with diabetic chronic kidney disease: Secondary | ICD-10-CM | POA: Diagnosis not present

## 2020-10-04 DIAGNOSIS — N183 Chronic kidney disease, stage 3 unspecified: Secondary | ICD-10-CM | POA: Insufficient documentation

## 2020-10-04 DIAGNOSIS — Z79899 Other long term (current) drug therapy: Secondary | ICD-10-CM | POA: Diagnosis not present

## 2020-10-04 DIAGNOSIS — M1990 Unspecified osteoarthritis, unspecified site: Secondary | ICD-10-CM | POA: Insufficient documentation

## 2020-10-04 NOTE — Patient Instructions (Signed)
Its good to see you today! I will see you back in 6 months for follow-up. As always, if you start having any new symptoms before your next visit with me, please call to see me sooner (this includes pelvic pain, vaginal bleeding, unintentional weight loss).

## 2020-10-04 NOTE — Progress Notes (Signed)
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 Mirena IUD placed instead  IHC MMR intact, MSI-stable   Endometrial cancer ([place])  03/30/2019 Imaging   CT A/P: No evidence of metastatic disease   04/27/2019 Initial Biopsy   EMB - Gr 2 EMCA   04/27/2019 Imaging   TVUS - 1.7cm endometrial lining, simple left ovarian cyst   04/27/2019 Initial Diagnosis   Endometrial cancer ([place])   06/24/2019 Surgery   D&C, Mirena IUD placement, vaginal and cervical biopsies Grade 2 endometrioid cancer Vaginal and cervical biopsies benign   09/14/2019 Surgery   Robotic-assisted laparoscopic total hysterectomy with bilateral salpingoophorectomy, SLN biopsy   On EUA, small mobile uterus, narrow introitus. ON intra-abdominal entry, normal upper abdominal survey. Normal appearing omentum, small and large bowel. Uterus 6-8 cm and normal appearing. Bilateral adnexa normal appearing with 2cm left para-ovarian hemorrhagic appearing cyst. Some retroperitoneal fibrosis on the left. Adhesions between the bladder and cervix. Mapping successful bilaterally: on the left, to deep obturator SLN. On right, mapping along two channels in the pelvic basins with no lymph nodes identified. Two channels ultimately terminated in a low right para-aortic lymph node although the node itself was not green.   09/14/2019 Pathologic Stage   Stage IA, gr 2, no LVSI, neg SLNs    11/04/2019 - 12/01/2019 Radiation Therapy   Radiation Treatment Dates: 11/04/2019 through 12/01/2019 Site Technique Total Dose (Gy) Dose per Fx (Gy) Completed Fx Beam Energies  Vagina: Pelvis HDR-brachy 30/30 6 5/5 Ir-192        Interval History: The patient presents today for surveillance visit.  She was last seen by  Dr. Sondra Come in early March and was doing well at that time without evidence of disease.  She is overall been doing well.  She reports having a fall 2 or 3 weeks ago while at a [employer].  She lost her balance despite walking with her cane going down some steps and hit multiple spots on her body including her left arm and her head.  She did not lose consciousness.  2 ambulances came and she was checked out but declines being taken to the emergency department.  She denies any symptoms since or further falls.  She denies any vaginal bleeding or discharge since I saw her last.  She has been using her vaginal dilator 1-2 times a week.  She is recently struggling with some constipation and took a laxative several days ago.  Otherwise she notes regular bowel movements.  She denies any urinary symptoms.  She endorses a good appetite without nausea or emesis.  Past Medical/Surgical History: Past Medical History:  Diagnosis Date   Arthritis    CKD (chronic kidney disease), stage III ([place])    patient denies   Depression    Diabetes mellitus without complication ([place])    Difficult intravenous access    Endometrial cancer ([place])    GERD (gastroesophageal reflux disease)    Headache    History of radiation therapy 05/07/2019-12/01/2019   Endometrial HDR; Dr. Gery Pray   Hypertension    Hypothyroidism    Neuromuscular disorder ([place])    neuropathy in feet   Osteoarthritis    PMB (postmenopausal bleeding)    PONV (postoperative nausea and vomiting)    severe nausea and vomiting after knee replacement 06-2014, did  ok with 2018 knee replacement   Urinary frequency    Wears glasses     Past Surgical History:  Procedure Laterality Date   BREAST SURGERY     cyst removed   CESAREAN SECTION     DILATION AND CURETTAGE OF UTERUS N/A 06/24/2019   Procedure: DILATATION AND CURETTAGE;  Surgeon: Lafonda Mosses, MD;  Location: [facility];  Service: Gynecology;  Laterality: N/A;    FRACTURE SURGERY     right knee   INTRAUTERINE DEVICE (IUD) INSERTION N/A 06/24/2019   Procedure: INTRAUTERINE DEVICE (IUD) INSERTION MIRENA;  Surgeon: Lafonda Mosses, MD;  Location: [facility];  Service: Gynecology;  Laterality: N/A;   IRRIGATION AND DEBRIDEMENT SEBACEOUS CYST     JOINT REPLACEMENT Right    right   LYMPH NODE DISSECTION N/A 09/14/2019   Procedure: LYMPH NODE DISSECTION;  Surgeon: Lafonda Mosses, MD;  Location: [facility];  Service: Gynecology;  Laterality: N/A;   ROBOTIC ASSISTED TOTAL HYSTERECTOMY WITH BILATERAL SALPINGO OOPHERECTOMY Bilateral 09/14/2019   Procedure: XI ROBOTIC ASSISTED TOTAL HYSTERECTOMY WITH BILATERAL SALPINGO OOPHORECTOMY;  Surgeon: Lafonda Mosses, MD;  Location: [facility];  Service: Gynecology;  Laterality: Bilateral;   SENTINEL NODE BIOPSY N/A 09/14/2019   Procedure: SENTINEL NODE BIOPSY;  Surgeon: Lafonda Mosses, MD;  Location: [facility];  Service: Gynecology;  Laterality: N/A;   teeth extration     TONSILLECTOMY     TOTAL HIP ARTHROPLASTY Right 02/23/2015   Procedure: RIGHT TOTAL HIP ARTHROPLASTY ANTERIOR APPROACH;  Surgeon: Leandrew Koyanagi, MD;  Location: [facility];  Service: Orthopedics;  Laterality: Right;   TOTAL KNEE ARTHROPLASTY Left 06/27/2016   Procedure: LEFT TOTAL KNEE ARTHROPLASTY;  Surgeon: Leandrew Koyanagi, MD;  Location: [facility];  Service: Orthopedics;  Laterality: Left;    Family History  Problem Relation Age of Onset   Pancreatic cancer Mother    Stroke Father    Hypertension Father     Social History   Socioeconomic History   Marital status: Widowed    Spouse name: Not on file   Number of children: Not on file   Years of education: Not on file   Highest education level: Not on file  Occupational History   Not on file  Tobacco Use   Smoking status: Former    Packs/day: 1.50    Years: 45.00    Pack years: 67.50    Types: Cigarettes    Quit date: 04/22/2002    Years since quitting: 18.4   Smokeless tobacco:  Never  Vaping Use   Vaping Use: Never used  Substance and Sexual Activity   Alcohol use: Yes    Comment: rarely   Drug use: No   Sexual activity: Not Currently  Other Topics Concern   Not on file  Social History Narrative   Not on file   Social Determinants of Health   Financial Resource Strain: Not on file  Food Insecurity: Not on file  Transportation Needs: Not on file  Physical Activity: Not on file  Stress: Not on file  Social Connections: Not on file    Current Medications:  Current Outpatient Medications:    Accu-Chek FastClix Lancets MISC, CHECK BLOOD SUGAR TWICE DAILY, Disp: , Rfl:    aspirin EC 81 MG tablet, Take 81 mg by mouth daily. Swallow whole., Disp: , Rfl:    calcium carbonate (OSCAL) 1500 (600 Ca) MG TABS tablet, Take by mouth daily with breakfast., Disp: , Rfl:  fenofibrate 160 MG tablet, Take 160 mg by mouth daily after breakfast. , Disp: , Rfl: 2   HUMALOG MIX 75/25 KWIKPEN (75-25) 100 UNIT/ML Kwikpen, Inject 24-30 Units into the skin See admin instructions. Inject 36units subcutaneously in the morning & inject 24 units subcutaneously in the evening., Disp: , Rfl: 3   metFORMIN (GLUCOPHAGE) 1000 MG tablet, Take 1,000 mg by mouth 2 (two) times daily. , Disp: , Rfl: 5   Multiple Vitamins-Minerals (MULTIVITAMIN WITH MINERALS) tablet, Take 1 tablet by mouth daily. Centrum Silver for Women, Disp: , Rfl:    telmisartan (MICARDIS) 80 MG tablet, Take 80 mg by mouth daily after breakfast. , Disp: , Rfl:   Review of Systems: Pertinent positives include appetite changes, fever/chills, some increased weight secondary to eating more, fatigue, hearing loss, some ringing in her ears, yellowing of the eyes, some leg swelling, constipation, diarrhea, urinary frequency, muscle pain/cramps, some dizziness, some confusion, some difficulty with walking, some easy bruising and bleeding, some decreased concentration. Denies neck lumps or masses, mouth sores or voice  changes. Denies cough or wheezing.  Denies shortness of breath. Denies chest pain or palpitations.  Denies abdominal distention, pain, blood in stools, nausea, vomiting, or early satiety. Denies pain with intercourse, dysuria, hematuria or incontinence. Denies hot flashes, pelvic pain, vaginal bleeding or vaginal discharge.   Denies joint pain, back pain. Denies itching, rash, or wounds. Denies headaches, numbness or seizures. Denies swollen lymph nodes or glands. Denies anxiety, depression, confusion.  Physical Exam: BP (!) 137/99   Pulse 92   Temp 98.5 F (36.9 C) (Tympanic)   Resp 20   Wt 197 lb 3.2 oz (89.4 kg)   SpO2 97%   BMI 32.82 kg/m  General: Alert, oriented, no acute distress. HEENT: Normocephalic, atraumatic, sclera anicteric. Chest: Clear to auscultation bilaterally.  Unlabored breathing on room air, no wheezes or rhonchi. Abdomen: Obese, soft, nontender.  Normoactive bowel sounds.  No masses or hepatosplenomegaly appreciated.  Well-healed scar laparoscopic incisions. Extremities: Grossly normal range of motion.  Warm, well perfused.  Trace edema bilaterally, compression stockings in place. Skin: No rashes or lesions noted. Lymphatics: No cervical, supraclavicular, or inguinal adenopathy. GU: Normal appearing external genitalia without erythema, excoriation, or lesions.  Speculum exam reveals moderately atrophic vaginal mucosa, especially at the apex with changes consistent with prior radiation treatment.  No bleeding, discharge, or masses noted.  Bimanual exam reveals cuff intact, no nodularity or masses.  Rectovaginal exam confirms findings.  Laboratory & Radiologic Studies: None new  Assessment & Plan: Cassandra Dodson is a 80 y.o. woman with Stage IA grade 2 endometrial cancer who presents for surveillance visit.   Patient is doing well and is NED on exam today.  She is doing well and is approximately 10 months out from completion of adjuvant treatment.  When she is  a year out from treatment, we can transition visits to every 6 months.  She is seeing Dr. Sondra Come in September and I will see her back in December.  We reviewed again signs and symptoms that would be concerning for recurrence and that they should prompt a phone call to clinic to be seen prior to her next scheduled visit.  We also reviewed the importance of using her vaginal dilator in the setting of prior radiation.   32 minutes of total time was spent for this patient encounter, including preparation, face-to-face counseling with the patient and coordination of care, and documentation of the encounter.  Jeral Pinch, MD  Division of  Gynecologic Oncology  Department of Obstetrics and Gynecology  [facility]

## 2020-12-22 ENCOUNTER — Encounter: Payer: Self-pay | Admitting: Radiology

## 2020-12-27 NOTE — Progress Notes (Signed)
Radiation Oncology         [phone] ________________________________  Name: Cassandra Dodson MRN: [id number]  Date: 12/28/2020  DOB: 1941/03/30  Follow-Up Visit Note  CC: Shon Baton, MD  Lafonda Mosses, MD    ICD-10-CM   1. 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 Since then, the patient followed up with Dr. Berline Lopes on 10/04/20, during which time, she reported a recent fall 2 to 3 weeks prior, though no serious injury or ED encounter. The patient additionally reported using her vaginal dilator 1-2 per week and feeling in good health.    The patient otherwise has had no significant interval history since she was last seen.      She continues to use her vaginal dilator.  She has not noticed any bleeding after using using her vaginal dilator.  She denies any abdominal bloating or pelvic pain.  She does report some increased urinary frequency but no dysuria or hematuria.                          Allergies:  is allergic to anesthetics, amide and ether.  Meds: Current Outpatient Medications  Medication Sig Dispense Refill   Accu-Chek FastClix Lancets MISC CHECK BLOOD SUGAR TWICE DAILY     aspirin EC 81 MG tablet Take 81 mg by mouth daily. Swallow whole.     calcium carbonate (OSCAL) 1500 (600 Ca) MG TABS tablet Take by mouth daily with breakfast.     fenofibrate 160 MG tablet Take 160 mg by mouth daily after breakfast.   2   HUMALOG MIX 75/25 KWIKPEN (75-25) 100 UNIT/ML Kwikpen Inject 24-30 Units into the skin See admin instructions. Inject 36units subcutaneously in  the morning & inject 24 units subcutaneously in the evening.  3   metFORMIN (GLUCOPHAGE) 1000 MG tablet Take 1,000 mg by mouth 2 (two) times daily.   5   Multiple Vitamins-Minerals (MULTIVITAMIN WITH MINERALS) tablet Take 1 tablet by mouth daily. Centrum Silver for Women     telmisartan (MICARDIS) 80 MG tablet Take 80 mg by mouth daily after breakfast.      No current facility-administered medications for this encounter.    Physical Findings: The patient is in no acute distress. Patient is alert and oriented.  She continues to be very hard of hearing.  height is '5\' 5"'$  (1.651 m) and weight is 200 lb (90.7 kg). Her temperature is 98.3 F (36.8 C). Her blood pressure is 148/91 (abnormal) and her pulse is 82. Her respiration is 24 (abnormal) and oxygen saturation is 98%. .  No significant changes. Lungs are clear to auscultation bilaterally. Heart has regular rate and rhythm. No palpable cervical, supraclavicular, or axillary adenopathy. Abdomen soft, non-tender, normal bowel sounds.  On pelvic examination the external genitalia were unremarkable. A speculum exam was performed. There are no mucosal lesions noted in the vaginal vault.  Some radiation changes noted in the proximal vagina.  Vaginal cuff is visualized.  No lesions noted along this area.  On bimanual and rectovaginal  examination no pelvic masses appreciated.  Vaginal cuff intact.  Rectal sphincter tone normal.  Lab Findings: Lab Results  Component Value Date   WBC 7.2 09/03/2019   HGB 12.4 09/03/2019   HCT 41.1 09/03/2019   MCV 86.9 09/03/2019   PLT 184 09/03/2019    Radiographic Findings: No results found.  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  Patient does not appear to have any lasting effects of her vaginal brachytherapy.  Plan: Patient will follow up with Dr. Berline Lopes in December.  She will follow-up with radiation  oncology in March.   23 minutes of total time was spent for this patient encounter, including preparation, face-to-face counseling with the patient and coordination of care, physical exam, and documentation of the encounter. ____________________________________  Blair Promise, PhD, MD   This document serves as a record of services personally performed by Gery Pray, MD. It was created on his behalf by Roney Mans, a trained medical scribe. The creation of this record is based on the scribe's personal observations and the provider's statements to them. This document has been checked and approved by the attending provider.

## 2020-12-28 ENCOUNTER — Encounter: Payer: Self-pay | Admitting: Radiation Oncology

## 2020-12-28 ENCOUNTER — Ambulatory Visit
Admission: RE | Admit: 2020-12-28 | Discharge: 2020-12-28 | Disposition: A | Discharge status: Home / Self Care | Payer: Medicare Other | Source: Ambulatory Visit | Attending: Radiation Oncology | Admitting: Radiation Oncology

## 2020-12-28 ENCOUNTER — Other Ambulatory Visit: Payer: Self-pay

## 2020-12-28 VITALS — BP 148/91 | HR 82 | Temp 98.3°F | Resp 24 | Ht 65.0 in | Wt 200.0 lb

## 2020-12-28 DIAGNOSIS — Z79899 Other long term (current) drug therapy: Secondary | ICD-10-CM | POA: Insufficient documentation

## 2020-12-28 DIAGNOSIS — Z923 Personal history of irradiation: Secondary | ICD-10-CM | POA: Insufficient documentation

## 2020-12-28 DIAGNOSIS — Z7982 Long term (current) use of aspirin: Secondary | ICD-10-CM | POA: Insufficient documentation

## 2020-12-28 DIAGNOSIS — Z8542 Personal history of malignant neoplasm of other parts of uterus: Secondary | ICD-10-CM | POA: Insufficient documentation

## 2020-12-28 DIAGNOSIS — R350 Frequency of micturition: Secondary | ICD-10-CM | POA: Diagnosis not present

## 2020-12-28 DIAGNOSIS — Z7984 Long term (current) use of oral hypoglycemic drugs: Secondary | ICD-10-CM | POA: Insufficient documentation

## 2020-12-28 DIAGNOSIS — C541 Malignant neoplasm of endometrium: Secondary | ICD-10-CM

## 2020-12-28 NOTE — Progress Notes (Signed)
Cassandra Dodson is here today for follow up post radiation to the pelvic.  They completed their radiation on: 12/01/19  Does the patient complain of any of the following:  Pain: Patient denies pain.  Abdominal bloating: no Diarrhea/Constipation: diarrhea Nausea/Vomiting: at times  Vaginal Discharge: no Blood in Urine or Stool: no Urinary Issues (dysuria/incomplete emptying/ incontinence/ increased frequency/urgency): Patient reports having urinary frequency.  Does patient report using vaginal dilator 2-3 times a week and/or sexually active 2-3 weeks: Patient continues to use dilator . Post radiation skin changes: skin remains intact.    Additional comments if applicable:   Vitals:   12/28/20 1546  BP: (!) 148/91  Pulse: 82  Resp: (!) 24  Temp: 98.3 F (36.8 C)  SpO2: 98%  Weight: 200 lb (90.7 kg)  Height: '5\' 5"'$  (1.651 m)

## 2020-12-29 ENCOUNTER — Telehealth: Payer: Self-pay | Admitting: *Deleted

## 2020-12-29 NOTE — Telephone Encounter (Signed)
CALLED PATIENT TO INFORM OF FU FOR 07-02-21 @ 3:30 PM. WITH DR. KINARD SPOKE WITH PATIENT AND SHE IS AWARE OF THIS APPT.

## 2021-04-05 ENCOUNTER — Encounter: Payer: Self-pay | Admitting: Gynecologic Oncology

## 2021-04-06 ENCOUNTER — Ambulatory Visit: Payer: Medicare Other | Admitting: Gynecologic Oncology

## 2021-04-09 ENCOUNTER — Encounter: Payer: Self-pay | Admitting: Gynecologic Oncology

## 2021-04-09 ENCOUNTER — Other Ambulatory Visit: Payer: Self-pay

## 2021-04-09 ENCOUNTER — Inpatient Hospital Stay: Payer: Medicare Other | Attending: Gynecologic Oncology | Admitting: Gynecologic Oncology

## 2021-04-09 VITALS — BP 111/78 | HR 96 | Temp 97.7°F | Resp 18 | Ht 65.0 in | Wt 193.6 lb

## 2021-04-09 DIAGNOSIS — Z90722 Acquired absence of ovaries, bilateral: Secondary | ICD-10-CM | POA: Diagnosis not present

## 2021-04-09 DIAGNOSIS — K219 Gastro-esophageal reflux disease without esophagitis: Secondary | ICD-10-CM | POA: Diagnosis not present

## 2021-04-09 DIAGNOSIS — Z8542 Personal history of malignant neoplasm of other parts of uterus: Secondary | ICD-10-CM | POA: Diagnosis present

## 2021-04-09 DIAGNOSIS — Z7982 Long term (current) use of aspirin: Secondary | ICD-10-CM | POA: Diagnosis not present

## 2021-04-09 DIAGNOSIS — Z923 Personal history of irradiation: Secondary | ICD-10-CM | POA: Insufficient documentation

## 2021-04-09 DIAGNOSIS — Z79899 Other long term (current) drug therapy: Secondary | ICD-10-CM | POA: Insufficient documentation

## 2021-04-09 DIAGNOSIS — I129 Hypertensive chronic kidney disease with stage 1 through stage 4 chronic kidney disease, or unspecified chronic kidney disease: Secondary | ICD-10-CM | POA: Insufficient documentation

## 2021-04-09 DIAGNOSIS — G709 Myoneural disorder, unspecified: Secondary | ICD-10-CM | POA: Insufficient documentation

## 2021-04-09 DIAGNOSIS — N183 Chronic kidney disease, stage 3 unspecified: Secondary | ICD-10-CM | POA: Diagnosis not present

## 2021-04-09 DIAGNOSIS — Z90710 Acquired absence of both cervix and uterus: Secondary | ICD-10-CM | POA: Diagnosis not present

## 2021-04-09 DIAGNOSIS — E039 Hypothyroidism, unspecified: Secondary | ICD-10-CM | POA: Insufficient documentation

## 2021-04-09 DIAGNOSIS — F32A Depression, unspecified: Secondary | ICD-10-CM | POA: Diagnosis not present

## 2021-04-09 DIAGNOSIS — Z7984 Long term (current) use of oral hypoglycemic drugs: Secondary | ICD-10-CM | POA: Insufficient documentation

## 2021-04-09 DIAGNOSIS — E119 Type 2 diabetes mellitus without complications: Secondary | ICD-10-CM | POA: Diagnosis not present

## 2021-04-09 DIAGNOSIS — C541 Malignant neoplasm of endometrium: Secondary | ICD-10-CM

## 2021-04-09 NOTE — Patient Instructions (Signed)
It was good to see you today.  I do not see or feel any evidence of cancer on your exam.    You see Dr. Sondra Come in 3 months. I will see you for follow-up in 6 months.  If you remain without evidence of cancer recurrence at that time, we can transition to visits every 6 months.  Please call if you develop any concerning symptoms before your next visit with me.

## 2021-04-09 NOTE — Progress Notes (Signed)
Gynecologic Oncology Return Clinic Visit  04/09/2021  Reason for Visit: Follow-up in the setting of early stage uterine cancer  Treatment History: Oncology History Overview Note  Surgery initially scheduled for early March 2021, delayed given uncontrolled diabetes. Mirena IUD placed instead  IHC MMR intact, MSI-stable   Endometrial cancer ([place])  03/30/2019 Imaging   CT A/P: No evidence of metastatic disease   04/27/2019 Initial Biopsy   EMB - Gr 2 EMCA   04/27/2019 Imaging   TVUS - 1.7cm endometrial lining, simple left ovarian cyst   04/27/2019 Initial Diagnosis   Endometrial cancer ([place])   06/24/2019 Surgery   D&C, Mirena IUD placement, vaginal and cervical biopsies Grade 2 endometrioid cancer Vaginal and cervical biopsies benign   09/14/2019 Surgery   Robotic-assisted laparoscopic total hysterectomy with bilateral salpingoophorectomy, SLN biopsy   On EUA, small mobile uterus, narrow introitus. ON intra-abdominal entry, normal upper abdominal survey. Normal appearing omentum, small and large bowel. Uterus 6-8 cm and normal appearing. Bilateral adnexa normal appearing with 2cm left para-ovarian hemorrhagic appearing cyst. Some retroperitoneal fibrosis on the left. Adhesions between the bladder and cervix. Mapping successful bilaterally: on the left, to deep obturator SLN. On right, mapping along two channels in the pelvic basins with no lymph nodes identified. Two channels ultimately terminated in a low right para-aortic lymph node although the node itself was not green.   09/14/2019 Pathologic Stage   Stage IA, gr 2, no LVSI, neg SLNs    11/04/2019 - 12/01/2019 Radiation Therapy   Radiation Treatment Dates: 11/04/2019 through 12/01/2019 Site Technique Total Dose (Gy) Dose per Fx (Gy) Completed Fx Beam Energies  Vagina: Pelvis HDR-brachy 30/30 6 5/5 Ir-192        Interval History: She saw Dr. Sondra Come last on 9/8, was doing well and NED on exam at that time.  Reports doing well  today.  Denies any vaginal bleeding or discharge.  Continues to use her vaginal dilator 1 or 2 times a week.  Reports bowel function is at baseline, denies any urinary symptoms.  Denies any abdominal or pelvic pain.  Is having some issues with her foot/toe on the left, thinks this is related to her diabetes.  Past Medical/Surgical History: Past Medical History:  Diagnosis Date   Arthritis    CKD (chronic kidney disease), stage III ([place])    patient denies   Depression    Diabetes mellitus without complication ([place])    Difficult intravenous access    Endometrial cancer ([place])    GERD (gastroesophageal reflux disease)    Headache    History of radiation therapy 11/04/2019-12/01/2019   Endometrial HDR; Dr. Gery Pray   Hypertension    Hypothyroidism    Neuromuscular disorder ([place])    neuropathy in feet   Osteoarthritis    PMB (postmenopausal bleeding)    PONV (postoperative nausea and vomiting)    severe nausea and vomiting after knee replacement 06-2014, did ok with 2018 knee replacement   Urinary frequency    Wears glasses     Past Surgical History:  Procedure Laterality Date   BREAST SURGERY     cyst removed   CESAREAN SECTION     DILATION AND CURETTAGE OF UTERUS N/A 06/24/2019   Procedure: DILATATION AND CURETTAGE;  Surgeon: Lafonda Mosses, MD;  Location: [facility];  Service: Gynecology;  Laterality: N/A;   FRACTURE SURGERY     right knee   INTRAUTERINE DEVICE (IUD) INSERTION N/A 06/24/2019   Procedure: INTRAUTERINE DEVICE (IUD) INSERTION MIRENA;  Surgeon: Lafonda Mosses, MD;  Location: [facility];  Service: Gynecology;  Laterality: N/A;   IRRIGATION AND DEBRIDEMENT SEBACEOUS CYST     JOINT REPLACEMENT Right    right   LYMPH NODE DISSECTION N/A 09/14/2019   Procedure: LYMPH NODE DISSECTION;  Surgeon: Lafonda Mosses, MD;  Location: [facility];  Service: Gynecology;  Laterality: N/A;   ROBOTIC ASSISTED TOTAL HYSTERECTOMY WITH  BILATERAL SALPINGO OOPHERECTOMY Bilateral 09/14/2019   Procedure: XI ROBOTIC ASSISTED TOTAL HYSTERECTOMY WITH BILATERAL SALPINGO OOPHORECTOMY;  Surgeon: Lafonda Mosses, MD;  Location: [facility];  Service: Gynecology;  Laterality: Bilateral;   SENTINEL NODE BIOPSY N/A 09/14/2019   Procedure: SENTINEL NODE BIOPSY;  Surgeon: Lafonda Mosses, MD;  Location: [facility];  Service: Gynecology;  Laterality: N/A;   teeth extration     TONSILLECTOMY     TOTAL HIP ARTHROPLASTY Right 02/23/2015   Procedure: RIGHT TOTAL HIP ARTHROPLASTY ANTERIOR APPROACH;  Surgeon: Leandrew Koyanagi, MD;  Location: [facility];  Service: Orthopedics;  Laterality: Right;   TOTAL KNEE ARTHROPLASTY Left 06/27/2016   Procedure: LEFT TOTAL KNEE ARTHROPLASTY;  Surgeon: Leandrew Koyanagi, MD;  Location: [facility];  Service: Orthopedics;  Laterality: Left;    Family History  Problem Relation Age of Onset   Pancreatic cancer Mother    Stroke Father    Hypertension Father     Social History   Socioeconomic History   Marital status: Widowed    Spouse name: Not on file   Number of children: Not on file   Years of education: Not on file   Highest education level: Not on file  Occupational History   Not on file  Tobacco Use   Smoking status: Former    Packs/day: 1.50    Years: 45.00    Pack years: 67.50    Types: Cigarettes    Quit date: 04/22/2002    Years since quitting: 18.9   Smokeless tobacco: Never  Vaping Use   Vaping Use: Never used  Substance and Sexual Activity   Alcohol use: Yes    Comment: rarely   Drug use: No   Sexual activity: Not Currently  Other Topics Concern   Not on file  Social History Narrative   Not on file   Social Determinants of Health   Financial Resource Strain: Not on file  Food Insecurity: Not on file  Transportation Needs: Not on file  Physical Activity: Not on file  Stress: Not on file  Social Connections: Not on file    Current Medications:  Current Outpatient Medications:    aspirin EC 81  MG tablet, Take 81 mg by mouth daily. Swallow whole., Disp: , Rfl:    calcium carbonate (OSCAL) 1500 (600 Ca) MG TABS tablet, Take by mouth daily with breakfast., Disp: , Rfl:    fenofibrate 160 MG tablet, Take 160 mg by mouth daily after breakfast. , Disp: , Rfl: 2   HUMALOG MIX 75/25 KWIKPEN (75-25) 100 UNIT/ML Kwikpen, Inject 24-30 Units into the skin See admin instructions. Inject 36units subcutaneously in the morning & inject 24 units subcutaneously in the evening., Disp: , Rfl: 3   metFORMIN (GLUCOPHAGE) 1000 MG tablet, Take 1,000 mg by mouth 2 (two) times daily. , Disp: , Rfl: 5   telmisartan (MICARDIS) 80 MG tablet, Take 80 mg by mouth daily after breakfast. , Disp: , Rfl:    Accu-Chek FastClix Lancets MISC, CHECK BLOOD SUGAR TWICE DAILY (Patient not taking: Reported on 04/05/2021), Disp: , Rfl:  Multiple Vitamins-Minerals (MULTIVITAMIN WITH MINERALS) tablet, Take 1 tablet by mouth daily. Centrum Silver for Women (Patient not taking: Reported on 04/05/2021), Disp: , Rfl:   Review of Systems: Pertinent positives include voice changes, cough, constipation, joint pain, back pain, muscle pain/cramps, headache. Denies appetite changes, fevers, chills, fatigue, unexplained weight changes. Denies hearing loss, neck lumps or masses, mouth sores, ringing in ears. Denies wheezing.  Denies shortness of breath. Denies chest pain or palpitations. Denies leg swelling. Denies abdominal distention, pain, blood in stools, diarrhea, nausea, vomiting, or early satiety. Denies pain with intercourse, dysuria, frequency, hematuria or incontinence. Denies hot flashes, pelvic pain, vaginal bleeding or vaginal discharge.   Denies itching, rash, or wounds. Denies dizziness, numbness or seizures. Denies swollen lymph nodes or glands, denies easy bruising or bleeding. Denies anxiety, depression, confusion, or decreased concentration.  Physical Exam: BP (!) 192/94 (BP Location: Left Arm, Patient Position:  Sitting)    Pulse 96    Temp 97.7 F (36.5 C) (Tympanic)    Resp 18    Ht 5' 5"  (1.651 m)    Wt 193 lb 9.6 oz (87.8 kg)    SpO2 100%    BMI 32.22 kg/m  General: Alert, oriented, no acute distress. HEENT: Normocephalic, atraumatic, sclera anicteric. Chest: Clear to auscultation bilaterally.  No wheezes or rhonchi. Abdomen: Obese, soft, nontender.  Normoactive bowel sounds.  No masses or hepatosplenomegaly appreciated.  Well-healed laparoscopic incisions. Extremities: Grossly normal range of motion.  Warm, well perfused.  No edema bilaterally. Skin: No rashes or lesions noted. Lymphatics: No cervical, supraclavicular, or inguinal adenopathy. GU: Normal appearing external genitalia without erythema, excoriation, or lesions.  Speculum exam reveals moderately atrophic vaginal mucosa, no lesions or masses.  No discharge or bleeding.  Bimanual exam reveals cuff intact, no nodularity or masses.  Rectovaginal exam confirms exam findings.  Laboratory & Radiologic Studies: None new  Assessment & Plan: Cassandra Dodson is a 80 y.o. woman with Stage IA grade 2 endometrial cancer who presents for surveillance visit.  Adjuvant therapy completed in August 2021.   Patient is doing well and is NED on exam today.    Patient is scheduled with radiation oncology in 3 months for follow-up.  We will continue visits every 3 months until closer to the 2-year mark after completion of adjuvant treatment.  I will see her back in June, and if she remains NED, we can transition to visits every 6 months.   We reviewed again signs and symptoms that would be concerning for recurrence and that they should prompt a phone call to clinic to be seen prior to her next scheduled visit.  We also reviewed the importance of using her vaginal dilator in the setting of prior radiation.   Encouraged her to seek evaluation of her toe in an expeditious manner.  32 minutes of total time was spent for this patient encounter, including  preparation, face-to-face counseling with the patient and coordination of care, and documentation of the encounter.  Jeral Pinch, MD  Division of Gynecologic Oncology  Department of Obstetrics and Gynecology  [facility]

## 2021-07-01 NOTE — Progress Notes (Incomplete)
?Radiation Oncology         [phone] ?________________________________ ? ?Name: Cassandra Dodson MRN: [id number]  ?Date: 07/02/2021  DOB: 1940/07/26 ? ?Follow-Up Visit Note ? ?CC: Shon Baton, MD  Lafonda Mosses, MD ? ?No diagnosis found. ? 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 ?Interval Since Last Radiation: 1 year, 7 months, and 2 days   ? ?Radiation Treatment Dates: 11/04/2019 through 12/01/2019 ?Site Technique Total Dose (Gy) Dose per Fx (Gy) Completed Fx Beam Energies  ?Vagina: Pelvis HDR-brachy 30/30 6 5/5 Ir-192  ? ? ?Narrative:  The patient returns today for routine 6 month follow-up, she was last seen here for follow up on 12/28/20. Since her last visit, the patient followed up with Dr. Berline Lopes on 04/09/21. During which time, the patient reported using her vaginal dilator 1 or 2 times a week, and denied any symptoms concerning for disease recurrence. Pelvic exam performed revealed NED.      ? ?Otherwise, no significant interval history since the patient was last seen.     ? ?***                      ? ?Allergies:  is allergic to anesthetics, amide and ether. ? ?Meds: ?Current Outpatient Medications  ?Medication Sig Dispense Refill  ? Accu-Chek FastClix Lancets MISC CHECK BLOOD SUGAR TWICE DAILY (Patient not taking: Reported on 04/05/2021)    ? aspirin EC 81 MG tablet Take 81 mg by mouth daily. Swallow whole.    ? calcium carbonate (OSCAL) 1500 (600 Ca) MG TABS tablet Take by mouth daily with breakfast.    ? fenofibrate 160 MG tablet Take 160 mg by mouth daily after breakfast.   2  ? HUMALOG MIX 75/25 KWIKPEN (75-25) 100 UNIT/ML Kwikpen Inject 24-30 Units into the skin See admin instructions. Inject 36units subcutaneously in the morning & inject 24 units subcutaneously in the evening.  3  ? metFORMIN (GLUCOPHAGE) 1000 MG tablet Take 1,000 mg by mouth 2 (two) times daily.   5  ? Multiple Vitamins-Minerals (MULTIVITAMIN WITH MINERALS) tablet Take 1  tablet by mouth daily. Centrum Silver for Women (Patient not taking: Reported on 04/05/2021)    ? telmisartan (MICARDIS) 80 MG tablet Take 80 mg by mouth daily after breakfast.     ? ?No current facility-administered medications for this encounter.  ? ? ?Physical Findings: ?The patient is in no acute distress. Patient is alert and oriented. ? vitals were not taken for this visit. .  No significant changes. Lungs are clear to auscultation bilaterally. Heart has regular rate and rhythm. No palpable cervical, supraclavicular, or axillary adenopathy. Abdomen soft, non-tender, normal bowel sounds. ? ?On pelvic examination the external genitalia were unremarkable. A speculum exam was performed. There are no mucosal lesions noted in the vaginal vault. A Pap smear was obtained of the proximal vagina. On bimanual and rectovaginal examination there were no pelvic masses appreciated. *** ? ? ? ?Lab Findings: ?Lab Results  ?Component Value Date  ? WBC 7.2 09/03/2019  ? HGB 12.4 09/03/2019  ? HCT 41.1 09/03/2019  ? MCV 86.9 09/03/2019  ? PLT 184 09/03/2019  ? ? ?Radiographic Findings: ?No results found. ? 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 ?The patient is recovering from the effects of radiation.  *** ? ?Plan:  *** ? ? ?*** minutes of total time was spent for this patient encounter, including preparation, face-to-face counseling with  the patient and coordination of care, physical exam, and documentation of the encounter. ?____________________________________ ? ?Blair Promise, PhD, MD ? ? ?This document serves as a record of services personally performed by Gery Pray, MD. It was created on his behalf by Roney Mans, a trained medical scribe. The creation of this record is based on the scribe's personal observations and the provider's statements to them. This document has been checked and approved by the attending provider. ? ?

## 2021-07-02 ENCOUNTER — Ambulatory Visit
Admission: RE | Admit: 2021-07-02 | Discharge: 2021-07-02 | Disposition: A | Discharge status: Home / Self Care | Payer: Medicare Other | Source: Ambulatory Visit | Attending: Radiation Oncology | Admitting: Radiation Oncology

## 2021-10-04 ENCOUNTER — Encounter: Payer: Self-pay | Admitting: Gynecologic Oncology

## 2021-10-08 ENCOUNTER — Ambulatory Visit: Payer: Medicare Other | Admitting: Gynecologic Oncology

## 2021-10-08 DIAGNOSIS — C541 Malignant neoplasm of endometrium: Secondary | ICD-10-CM

## 2021-10-08 NOTE — Patient Instructions (Signed)
It was good to see you today.  I do not see or feel any evidence of cancer recurrence on your exam.  I will plan to see you back in December for follow-up.  Please call back in October or November to get that visit scheduled.  As always, if you develop new and concerning symptoms before then, please call to see me sooner.

## 2021-10-08 NOTE — Progress Notes (Unsigned)
Gynecologic Oncology Return Clinic Visit  10/08/21  Reason for Visit: Follow-up in the setting of early stage uterine cancer  Treatment History: Oncology History Overview Note  Surgery initially scheduled for early March 2021, delayed given uncontrolled diabetes. Mirena IUD placed instead  IHC MMR intact, MSI-stable   Endometrial cancer ([employer])  03/30/2019 Imaging   CT A/P: No evidence of metastatic disease   04/27/2019 Initial Biopsy   EMB - Gr 2 EMCA   04/27/2019 Imaging   TVUS - 1.7cm endometrial lining, simple left ovarian cyst   04/27/2019 Initial Diagnosis   Endometrial cancer ([place])   06/24/2019 Surgery   D&C, Mirena IUD placement, vaginal and cervical biopsies Grade 2 endometrioid cancer Vaginal and cervical biopsies benign   09/14/2019 Surgery   Robotic-assisted laparoscopic total hysterectomy with bilateral salpingoophorectomy, SLN biopsy   On EUA, small mobile uterus, narrow introitus. ON intra-abdominal entry, normal upper abdominal survey. Normal appearing omentum, small and large bowel. Uterus 6-8 cm and normal appearing. Bilateral adnexa normal appearing with 2cm left para-ovarian hemorrhagic appearing cyst. Some retroperitoneal fibrosis on the left. Adhesions between the bladder and cervix. Mapping successful bilaterally: on the left, to deep obturator SLN. On right, mapping along two channels in the pelvic basins with no lymph nodes identified. Two channels ultimately terminated in a low right para-aortic lymph node although the node itself was not green.   09/14/2019 Pathologic Stage   Stage IA, gr 2, no LVSI, neg SLNs    11/04/2019 - 12/01/2019 Radiation Therapy   Radiation Treatment Dates: 11/04/2019 through 12/01/2019 Site Technique Total Dose (Gy) Dose per Fx (Gy) Completed Fx Beam Energies  Vagina: Pelvis HDR-brachy 30/30 6 5/5 Ir-192        Interval History: ***  She saw Dr. Sondra Come last on 9/8, was doing well and NED on exam at that time.   Reports doing well  today.  Denies any vaginal bleeding or discharge.  Continues to use her vaginal dilator 1 or 2 times a week.  Reports bowel function is at baseline, denies any urinary symptoms.  Denies any abdominal or pelvic pain.   Is having some issues with her foot/toe on the left, thinks this is related to her diabetes.  Past Medical/Surgical History: Past Medical History:  Diagnosis Date   Arthritis    CKD (chronic kidney disease), stage III ([place])    patient denies   Depression    Diabetes mellitus without complication ([place])    Difficult intravenous access    Endometrial cancer ([place])    GERD (gastroesophageal reflux disease)    Headache    History of radiation therapy 11/04/2019-12/01/2019   Endometrial HDR; Dr. Gery Pray   Hypertension    Hypothyroidism    Neuromuscular disorder ([place])    neuropathy in feet   Osteoarthritis    PMB (postmenopausal bleeding)    PONV (postoperative nausea and vomiting)    severe nausea and vomiting after knee replacement 06-2014, did ok with 2018 knee replacement   Urinary frequency    Wears glasses     Past Surgical History:  Procedure Laterality Date   BREAST SURGERY     cyst removed   CESAREAN SECTION     DILATION AND CURETTAGE OF UTERUS N/A 06/24/2019   Procedure: DILATATION AND CURETTAGE;  Surgeon: Lafonda Mosses, MD;  Location: [place];  Service: Gynecology;  Laterality: N/A;   FRACTURE SURGERY     right knee   INTRAUTERINE DEVICE (IUD) INSERTION N/A 06/24/2019   Procedure: INTRAUTERINE  DEVICE (IUD) INSERTION MIRENA;  Surgeon: Lafonda Mosses, MD;  Location: [facility];  Service: Gynecology;  Laterality: N/A;   IRRIGATION AND DEBRIDEMENT SEBACEOUS CYST     JOINT REPLACEMENT Right    right   LYMPH NODE DISSECTION N/A 09/14/2019   Procedure: LYMPH NODE DISSECTION;  Surgeon: Lafonda Mosses, MD;  Location: [facility];  Service: Gynecology;  Laterality: N/A;   ROBOTIC ASSISTED TOTAL HYSTERECTOMY WITH  BILATERAL SALPINGO OOPHERECTOMY Bilateral 09/14/2019   Procedure: XI ROBOTIC ASSISTED TOTAL HYSTERECTOMY WITH BILATERAL SALPINGO OOPHORECTOMY;  Surgeon: Lafonda Mosses, MD;  Location: [facility];  Service: Gynecology;  Laterality: Bilateral;   SENTINEL NODE BIOPSY N/A 09/14/2019   Procedure: SENTINEL NODE BIOPSY;  Surgeon: Lafonda Mosses, MD;  Location: [facility];  Service: Gynecology;  Laterality: N/A;   teeth extration     TONSILLECTOMY     TOTAL HIP ARTHROPLASTY Right 02/23/2015   Procedure: RIGHT TOTAL HIP ARTHROPLASTY ANTERIOR APPROACH;  Surgeon: Leandrew Koyanagi, MD;  Location: [place];  Service: Orthopedics;  Laterality: Right;   TOTAL KNEE ARTHROPLASTY Left 06/27/2016   Procedure: LEFT TOTAL KNEE ARTHROPLASTY;  Surgeon: Leandrew Koyanagi, MD;  Location: [place];  Service: Orthopedics;  Laterality: Left;    Family History  Problem Relation Age of Onset   Pancreatic cancer Mother    Stroke Father    Hypertension Father    Colon cancer Neg Hx    Breast cancer Neg Hx    Ovarian cancer Neg Hx    Endometrial cancer Neg Hx    Prostate cancer Neg Hx     Social History   Socioeconomic History   Marital status: Widowed    Spouse name: Not on file   Number of children: Not on file   Years of education: Not on file   Highest education level: Not on file  Occupational History   Not on file  Tobacco Use   Smoking status: Former    Packs/day: 1.50    Years: 45.00    Total pack years: 67.50    Types: Cigarettes    Quit date: 04/22/2002    Years since quitting: 19.4   Smokeless tobacco: Never  Vaping Use   Vaping Use: Never used  Substance and Sexual Activity   Alcohol use: Yes    Comment: rarely   Drug use: No   Sexual activity: Not Currently  Other Topics Concern   Not on file  Social History Narrative   Not on file   Social Determinants of Health   Financial Resource Strain: Not on file  Food Insecurity: Not on file  Transportation Needs: Not on file  Physical Activity: Not on  file  Stress: Not on file  Social Connections: Not on file    Current Medications:  Current Outpatient Medications:    aspirin EC 81 MG tablet, Take 81 mg by mouth daily. Swallow whole., Disp: , Rfl:    calcium carbonate (OSCAL) 1500 (600 Ca) MG TABS tablet, Take by mouth daily with breakfast., Disp: , Rfl:    fenofibrate 160 MG tablet, Take 160 mg by mouth daily after breakfast. , Disp: , Rfl: 2   HUMALOG MIX 75/25 KWIKPEN (75-25) 100 UNIT/ML Kwikpen, Inject 24-30 Units into the skin See admin instructions. Inject 36units subcutaneously in the morning & inject 24 units subcutaneously in the evening., Disp: , Rfl: 3   metFORMIN (GLUCOPHAGE) 1000 MG tablet, Take 1,000 mg by mouth 2 (two) times daily. , Disp: , Rfl: 5  telmisartan (MICARDIS) 80 MG tablet, Take 80 mg by mouth daily after breakfast. , Disp: , Rfl:   Review of Systems: Denies appetite changes, fevers, chills, fatigue, unexplained weight changes. Denies hearing loss, neck lumps or masses, mouth sores, ringing in ears or voice changes. Denies cough or wheezing.  Denies shortness of breath. Denies chest pain or palpitations. Denies leg swelling. Denies abdominal distention, pain, blood in stools, constipation, diarrhea, nausea, vomiting, or early satiety. Denies pain with intercourse, dysuria, frequency, hematuria or incontinence. Denies hot flashes, pelvic pain, vaginal bleeding or vaginal discharge.   Denies joint pain, back pain or muscle pain/cramps. Denies itching, rash, or wounds. Denies dizziness, headaches, numbness or seizures. Denies swollen lymph nodes or glands, denies easy bruising or bleeding. Denies anxiety, depression, confusion, or decreased concentration.  Physical Exam: There were no vitals taken for this visit. General: ***Alert, oriented, no acute distress. HEENT: ***Posterior oropharynx clear, sclera anicteric. Chest: ***Clear to auscultation bilaterally.  ***Port site clean. Cardiovascular:  ***Regular rate and rhythm, no murmurs. Abdomen: ***Obese, soft, nontender.  Normoactive bowel sounds.  No masses or hepatosplenomegaly appreciated.  ***Well-healed scar. Extremities: ***Grossly normal range of motion.  Warm, well perfused.  No edema bilaterally. Skin: ***No rashes or lesions noted. Lymphatics: ***No cervical, supraclavicular, or inguinal adenopathy. GU: Normal appearing external genitalia without erythema, excoriation, or lesions.  Speculum exam reveals ***.  Bimanual exam reveals ***.  ***Rectovaginal exam  confirms ___. General: Alert, oriented, no acute distress. HEENT: Normocephalic, atraumatic, sclera anicteric. Chest: Clear to auscultation bilaterally.  No wheezes or rhonchi. Abdomen: Obese, soft, nontender.  Normoactive bowel sounds.  No masses or hepatosplenomegaly appreciated.  Well-healed laparoscopic incisions. Extremities: Grossly normal range of motion.  Warm, well perfused.  No edema bilaterally. Skin: No rashes or lesions noted. Lymphatics: No cervical, supraclavicular, or inguinal adenopathy. GU: Normal appearing external genitalia without erythema, excoriation, or lesions.  Speculum exam reveals moderately atrophic vaginal mucosa, no lesions or masses.  No discharge or bleeding.  Bimanual exam reveals cuff intact, no nodularity or masses.  Rectovaginal exam confirms exam findings.  Laboratory & Radiologic Studies: None new  Assessment & Plan: Cassandra Dodson is a 80 y.o. woman with Stage IA grade 2 endometrial cancer who presents for surveillance visit.  Adjuvant therapy completed in August 2021. MMR intact, MS stable.   Patient is doing well and is NED on exam today.     Patient is scheduled with radiation oncology in 3 months for follow-up.  She will be 2 years from completion of adjuvant therapy in August.  We will plan to transition to surveillance visits every 6 months.  I will see her back in December.     We reviewed again signs and symptoms that  would be concerning for recurrence and that they should prompt a phone call to clinic to be seen prior to her next scheduled visit.  We also reviewed the importance of using her vaginal dilator in the setting of prior radiation.    *** minutes of total time was spent for this patient encounter, including preparation, face-to-face counseling with the patient and coordination of care, and documentation of the encounter.  Jeral Pinch, MD  Division of Gynecologic Oncology  Department of Obstetrics and Gynecology  [facility]

## 2021-10-09 ENCOUNTER — Encounter: Payer: Self-pay | Admitting: Gynecologic Oncology

## 2021-10-11 ENCOUNTER — Inpatient Hospital Stay: Payer: Medicare Other | Attending: Gynecologic Oncology | Admitting: Gynecologic Oncology

## 2021-10-11 ENCOUNTER — Encounter: Payer: Self-pay | Admitting: Gynecologic Oncology

## 2021-10-11 ENCOUNTER — Other Ambulatory Visit: Payer: Self-pay

## 2021-10-11 VITALS — BP 182/102 | HR 94 | Temp 98.6°F | Resp 18 | Ht 65.0 in | Wt 169.0 lb

## 2021-10-11 DIAGNOSIS — Z90710 Acquired absence of both cervix and uterus: Secondary | ICD-10-CM | POA: Diagnosis not present

## 2021-10-11 DIAGNOSIS — Z90722 Acquired absence of ovaries, bilateral: Secondary | ICD-10-CM | POA: Insufficient documentation

## 2021-10-11 DIAGNOSIS — Z923 Personal history of irradiation: Secondary | ICD-10-CM | POA: Insufficient documentation

## 2021-10-11 DIAGNOSIS — C541 Malignant neoplasm of endometrium: Secondary | ICD-10-CM

## 2021-10-11 DIAGNOSIS — Z8542 Personal history of malignant neoplasm of other parts of uterus: Secondary | ICD-10-CM

## 2021-10-11 NOTE — Progress Notes (Signed)
Gynecologic Oncology Return Clinic Visit  10/11/21  Reason for Visit: Follow-up in the setting of early stage uterine cancer  Treatment History: Oncology History Overview Note  Surgery initially scheduled for early March 2021, delayed given uncontrolled diabetes. Mirena IUD placed instead  IHC MMR intact, MSI-stable   Endometrial cancer ([facility])  03/30/2019 Imaging   CT A/P: No evidence of metastatic disease   04/27/2019 Initial Biopsy   EMB - Gr 2 EMCA   04/27/2019 Imaging   TVUS - 1.7cm endometrial lining, simple left ovarian cyst   04/27/2019 Initial Diagnosis   Endometrial cancer ([facility])   06/24/2019 Surgery   D&C, Mirena IUD placement, vaginal and cervical biopsies Grade 2 endometrioid cancer Vaginal and cervical biopsies benign   09/14/2019 Surgery   Robotic-assisted laparoscopic total hysterectomy with bilateral salpingoophorectomy, SLN biopsy   On EUA, small mobile uterus, narrow introitus. ON intra-abdominal entry, normal upper abdominal survey. Normal appearing omentum, small and large bowel. Uterus 6-8 cm and normal appearing. Bilateral adnexa normal appearing with 2cm left para-ovarian hemorrhagic appearing cyst. Some retroperitoneal fibrosis on the left. Adhesions between the bladder and cervix. Mapping successful bilaterally: on the left, to deep obturator SLN. On right, mapping along two channels in the pelvic basins with no lymph nodes identified. Two channels ultimately terminated in a low right para-aortic lymph node although the node itself was not green.   09/14/2019 Pathologic Stage   Stage IA, gr 2, no LVSI, neg SLNs    11/04/2019 - 12/01/2019 Radiation Therapy   Radiation Treatment Dates: 11/04/2019 through 12/01/2019 Site Technique Total Dose (Gy) Dose per Fx (Gy) Completed Fx Beam Energies  Vagina: Pelvis HDR-brachy 30/30 6 5/5 Ir-192        Interval History: Patient missed her last appointment with Dr. Roselind Messier last on 9/8.  She continues to do well.  She denies  any vaginal bleeding or discharge.  Continues to endorse loose stools, unchanged from her recent baseline.  Denies any change to urinary symptoms.  Reports a good appetite without nausea or emesis.  Denies any abdominal or pelvic pain.  Past Medical/Surgical History: Past Medical History:  Diagnosis Date   Arthritis    CKD (chronic kidney disease), stage III ([facility])    patient denies   Depression    Diabetes mellitus without complication ([facility])    Difficult intravenous access    Endometrial cancer ([facility])    GERD (gastroesophageal reflux disease)    Headache    History of radiation therapy 11/04/2019-12/01/2019   Endometrial HDR; Dr. Antony Blackbird   Hypertension    Hypothyroidism    Neuromuscular disorder ([facility])    neuropathy in feet   Osteoarthritis    PMB (postmenopausal bleeding)    PONV (postoperative nausea and vomiting)    severe nausea and vomiting after knee replacement 06-2014, did ok with 2018 knee replacement   Urinary frequency    Wears glasses     Past Surgical History:  Procedure Laterality Date   BREAST SURGERY     cyst removed   CESAREAN SECTION     DILATION AND CURETTAGE OF UTERUS N/A 06/24/2019   Procedure: DILATATION AND CURETTAGE;  Surgeon: Carver Fila, MD;  Location: [facility];  Service: Gynecology;  Laterality: N/A;   FRACTURE SURGERY     right knee   INTRAUTERINE DEVICE (IUD) INSERTION N/A 06/24/2019   Procedure: INTRAUTERINE DEVICE (IUD) INSERTION MIRENA;  Surgeon: Carver Fila, MD;  Location: [facility];  Service: Gynecology;  Laterality: N/A;  IRRIGATION AND DEBRIDEMENT SEBACEOUS CYST     JOINT REPLACEMENT Right    right   LYMPH NODE DISSECTION N/A 09/14/2019   Procedure: LYMPH NODE DISSECTION;  Surgeon: Carver Fila, MD;  Location: [facility];  Service: Gynecology;  Laterality: N/A;   ROBOTIC ASSISTED TOTAL HYSTERECTOMY WITH BILATERAL SALPINGO OOPHERECTOMY Bilateral 09/14/2019   Procedure: XI ROBOTIC  ASSISTED TOTAL HYSTERECTOMY WITH BILATERAL SALPINGO OOPHORECTOMY;  Surgeon: Carver Fila, MD;  Location: [facility];  Service: Gynecology;  Laterality: Bilateral;   SENTINEL NODE BIOPSY N/A 09/14/2019   Procedure: SENTINEL NODE BIOPSY;  Surgeon: Carver Fila, MD;  Location: [facility];  Service: Gynecology;  Laterality: N/A;   teeth extration     TONSILLECTOMY     TOTAL HIP ARTHROPLASTY Right 02/23/2015   Procedure: RIGHT TOTAL HIP ARTHROPLASTY ANTERIOR APPROACH;  Surgeon: Tarry Kos, MD;  Location: [facility];  Service: Orthopedics;  Laterality: Right;   TOTAL KNEE ARTHROPLASTY Left 06/27/2016   Procedure: LEFT TOTAL KNEE ARTHROPLASTY;  Surgeon: Tarry Kos, MD;  Location: [facility];  Service: Orthopedics;  Laterality: Left;    Family History  Problem Relation Age of Onset   Pancreatic cancer Mother    Stroke Father    Hypertension Father    Colon cancer Neg Hx    Breast cancer Neg Hx    Ovarian cancer Neg Hx    Endometrial cancer Neg Hx    Prostate cancer Neg Hx     Social History   Socioeconomic History   Marital status: Widowed    Spouse name: Not on file   Number of children: Not on file   Years of education: Not on file   Highest education level: Not on file  Occupational History   Not on file  Tobacco Use   Smoking status: Former    Packs/day: 1.50    Years: 45.00    Total pack years: 67.50    Types: Cigarettes    Quit date: 04/22/2002    Years since quitting: 19.4   Smokeless tobacco: Never  Vaping Use   Vaping Use: Never used  Substance and Sexual Activity   Alcohol use: Yes    Comment: rarely   Drug use: No   Sexual activity: Not Currently  Other Topics Concern   Not on file  Social History Narrative   Not on file   Social Determinants of Health   Financial Resource Strain: Not on file  Food Insecurity: Not on file  Transportation Needs: Not on file  Physical Activity: Not on file  Stress: Not on file  Social Connections: Not on file    Current  Medications:  Current Outpatient Medications:    ALPRAZolam (XANAX) 0.5 MG tablet, Take 0.25-0.5 mg by mouth at bedtime as needed., Disp: , Rfl:    aspirin EC 81 MG tablet, Take 81 mg by mouth daily. Swallow whole., Disp: , Rfl:    calcium carbonate (OSCAL) 1500 (600 Ca) MG TABS tablet, Take by mouth daily with breakfast., Disp: , Rfl:    fenofibrate 160 MG tablet, Take 160 mg by mouth daily after breakfast. , Disp: , Rfl: 2   HUMALOG MIX 75/25 KWIKPEN (75-25) 100 UNIT/ML Kwikpen, Inject 24-30 Units into the skin See admin instructions. Inject 36units subcutaneously in the morning & inject 24 units subcutaneously in the evening., Disp: , Rfl: 3   metFORMIN (GLUCOPHAGE) 1000 MG tablet, Take 1,000 mg by mouth 2 (two) times daily. , Disp: , Rfl: 5   telmisartan (MICARDIS)  80 MG tablet, Take 80 mg by mouth daily after breakfast. , Disp: , Rfl:   Review of Systems: + Hearing loss, diarrhea, urinary frequency, muscle pain/cramping, itch. Denies appetite changes, fevers, chills, fatigue, unexplained weight changes. Denies hearing loss, neck lumps or masses, mouth sores, ringing in ears or voice changes. Denies cough or wheezing.  Denies shortness of breath. Denies chest pain or palpitations. Denies leg swelling. Denies abdominal distention, pain, blood in stools, constipation, nausea, vomiting, or early satiety. Denies pain with intercourse, dysuria, hematuria or incontinence. Denies hot flashes, pelvic pain, vaginal bleeding or vaginal discharge.   Denies joint pain, back pain. Denies rash, or wounds. Denies dizziness, headaches, numbness or seizures. Denies swollen lymph nodes or glands, denies easy bruising or bleeding. Denies anxiety, depression, confusion, or decreased concentration.  Physical Exam: BP (!) 182/102 (BP Location: Left Arm, Patient Position: Sitting)   Pulse 94   Temp 98.6 F (37 C) (Oral)   Resp 18   Ht 5\' 5"  (1.651 m)   Wt 169 lb (76.7 kg)   SpO2 97%   BMI 28.12 kg/m   General: Alert, oriented, no acute distress. HEENT: Normocephalic, atraumatic, sclera anicteric. Chest: Clear to auscultation bilaterally.  No wheezes or rhonchi. Abdomen: Obese, soft, nontender.  Normoactive bowel sounds.  No masses or hepatosplenomegaly appreciated.  Well-healed laparoscopic incisions. Extremities: Grossly normal range of motion.  Warm, well perfused.  No edema bilaterally. Skin: No rashes or lesions noted. Lymphatics: No cervical, supraclavicular, or inguinal adenopathy. GU: Normal appearing external genitalia without erythema, excoriation, or lesions.  Speculum exam reveals moderately atrophic vaginal mucosa, no lesions or masses.  Minimal agglutination at the apex of the vagina.  No discharge or bleeding.  Bimanual exam reveals cuff intact, no nodularity or masses.  Rectovaginal exam confirms exam findings.  Laboratory & Radiologic Studies: None new  Assessment & Plan: Cassandra Dodson is a 81 y.o. woman with Stage IA grade 2 endometrial cancer who presents for surveillance visit.  Adjuvant therapy completed in August 2021. MMR intact, MS stable.   Patient is doing well and is NED on exam today.  Patient is no longer using vaginal dilator regularly.  Given time from radiation, we discussed that regular use is no longer necessary.   She will be 2 years from completion of adjuvant therapy in August.  We will plan to transition to surveillance visits every 6 months.  I will see her back in December.     We reviewed again signs and symptoms that would be concerning for recurrence and that they should prompt a phone call to clinic to be seen prior to her next scheduled visit.  We also reviewed the importance of using her vaginal dilator in the setting of prior radiation  22 minutes of total time was spent for this patient encounter, including preparation, face-to-face counseling with the patient and coordination of care, and documentation of the encounter.  Eugene Garnet, MD   Division of Gynecologic Oncology  Department of Obstetrics and Gynecology  [facility]

## 2021-10-11 NOTE — Patient Instructions (Addendum)
It was good to see you today.  I do not see or feel any evidence of cancer recurrence on your exam.  You are just 2 years shy of completing treatment.  We will transition to visits every 6 months.  I will see you back in December.  Please call sometime in October or November to schedule that visit.  As always, please call if you develop any new and concerning symptoms prior to that.

## 2021-10-25 ENCOUNTER — Other Ambulatory Visit: Payer: Self-pay

## 2021-10-25 ENCOUNTER — Emergency Department (HOSPITAL_BASED_OUTPATIENT_CLINIC_OR_DEPARTMENT_OTHER): Payer: Medicare Other

## 2021-10-25 ENCOUNTER — Inpatient Hospital Stay (HOSPITAL_BASED_OUTPATIENT_CLINIC_OR_DEPARTMENT_OTHER)
Admission: EM | Admit: 2021-10-25 | Discharge: 2021-11-05 | DRG: 023 | Disposition: A | Discharge status: Rehabilitation Facility | Payer: Medicare Other | Attending: Internal Medicine | Admitting: Internal Medicine

## 2021-10-25 ENCOUNTER — Encounter (HOSPITAL_BASED_OUTPATIENT_CLINIC_OR_DEPARTMENT_OTHER): Payer: Self-pay | Admitting: Emergency Medicine

## 2021-10-25 ENCOUNTER — Observation Stay (HOSPITAL_COMMUNITY): Payer: Medicare Other

## 2021-10-25 DIAGNOSIS — L8990 Pressure ulcer of unspecified site, unspecified stage: Secondary | ICD-10-CM | POA: Insufficient documentation

## 2021-10-25 DIAGNOSIS — R471 Dysarthria and anarthria: Secondary | ICD-10-CM | POA: Diagnosis present

## 2021-10-25 DIAGNOSIS — I651 Occlusion and stenosis of basilar artery: Secondary | ICD-10-CM | POA: Diagnosis present

## 2021-10-25 DIAGNOSIS — E1140 Type 2 diabetes mellitus with diabetic neuropathy, unspecified: Secondary | ICD-10-CM | POA: Diagnosis present

## 2021-10-25 DIAGNOSIS — Z823 Family history of stroke: Secondary | ICD-10-CM

## 2021-10-25 DIAGNOSIS — E8809 Other disorders of plasma-protein metabolism, not elsewhere classified: Secondary | ICD-10-CM | POA: Diagnosis not present

## 2021-10-25 DIAGNOSIS — G9389 Other specified disorders of brain: Secondary | ICD-10-CM

## 2021-10-25 DIAGNOSIS — G9340 Encephalopathy, unspecified: Secondary | ICD-10-CM | POA: Diagnosis not present

## 2021-10-25 DIAGNOSIS — Z794 Long term (current) use of insulin: Secondary | ICD-10-CM

## 2021-10-25 DIAGNOSIS — Z7984 Long term (current) use of oral hypoglycemic drugs: Secondary | ICD-10-CM

## 2021-10-25 DIAGNOSIS — E669 Obesity, unspecified: Secondary | ICD-10-CM | POA: Diagnosis present

## 2021-10-25 DIAGNOSIS — R29810 Facial weakness: Secondary | ICD-10-CM | POA: Diagnosis present

## 2021-10-25 DIAGNOSIS — D696 Thrombocytopenia, unspecified: Secondary | ICD-10-CM | POA: Diagnosis present

## 2021-10-25 DIAGNOSIS — F0390 Unspecified dementia without behavioral disturbance: Secondary | ICD-10-CM | POA: Diagnosis present

## 2021-10-25 DIAGNOSIS — Z96641 Presence of right artificial hip joint: Secondary | ICD-10-CM | POA: Diagnosis present

## 2021-10-25 DIAGNOSIS — I6322 Cerebral infarction due to unspecified occlusion or stenosis of basilar arteries: Principal | ICD-10-CM | POA: Diagnosis present

## 2021-10-25 DIAGNOSIS — E871 Hypo-osmolality and hyponatremia: Secondary | ICD-10-CM | POA: Diagnosis not present

## 2021-10-25 DIAGNOSIS — R29703 NIHSS score 3: Secondary | ICD-10-CM | POA: Diagnosis present

## 2021-10-25 DIAGNOSIS — N1832 Chronic kidney disease, stage 3b: Secondary | ICD-10-CM | POA: Diagnosis not present

## 2021-10-25 DIAGNOSIS — Z7982 Long term (current) use of aspirin: Secondary | ICD-10-CM | POA: Diagnosis not present

## 2021-10-25 DIAGNOSIS — E1122 Type 2 diabetes mellitus with diabetic chronic kidney disease: Secondary | ICD-10-CM | POA: Diagnosis not present

## 2021-10-25 DIAGNOSIS — E11649 Type 2 diabetes mellitus with hypoglycemia without coma: Secondary | ICD-10-CM | POA: Diagnosis not present

## 2021-10-25 DIAGNOSIS — E785 Hyperlipidemia, unspecified: Secondary | ICD-10-CM

## 2021-10-25 DIAGNOSIS — I129 Hypertensive chronic kidney disease with stage 1 through stage 4 chronic kidney disease, or unspecified chronic kidney disease: Secondary | ICD-10-CM | POA: Diagnosis present

## 2021-10-25 DIAGNOSIS — T380X5A Adverse effect of glucocorticoids and synthetic analogues, initial encounter: Secondary | ICD-10-CM | POA: Diagnosis not present

## 2021-10-25 DIAGNOSIS — Z8542 Personal history of malignant neoplasm of other parts of uterus: Secondary | ICD-10-CM

## 2021-10-25 DIAGNOSIS — Z800 Family history of malignant neoplasm of digestive organs: Secondary | ICD-10-CM

## 2021-10-25 DIAGNOSIS — Z923 Personal history of irradiation: Secondary | ICD-10-CM

## 2021-10-25 DIAGNOSIS — G8194 Hemiplegia, unspecified affecting left nondominant side: Secondary | ICD-10-CM | POA: Diagnosis present

## 2021-10-25 DIAGNOSIS — R900 Intracranial space-occupying lesion found on diagnostic imaging of central nervous system: Principal | ICD-10-CM

## 2021-10-25 DIAGNOSIS — D320 Benign neoplasm of cerebral meninges: Secondary | ICD-10-CM | POA: Diagnosis present

## 2021-10-25 DIAGNOSIS — Z888 Allergy status to other drugs, medicaments and biological substances status: Secondary | ICD-10-CM

## 2021-10-25 DIAGNOSIS — E1165 Type 2 diabetes mellitus with hyperglycemia: Secondary | ICD-10-CM

## 2021-10-25 DIAGNOSIS — E039 Hypothyroidism, unspecified: Secondary | ICD-10-CM | POA: Diagnosis present

## 2021-10-25 DIAGNOSIS — L89151 Pressure ulcer of sacral region, stage 1: Secondary | ICD-10-CM | POA: Diagnosis present

## 2021-10-25 DIAGNOSIS — Z884 Allergy status to anesthetic agent status: Secondary | ICD-10-CM

## 2021-10-25 DIAGNOSIS — J95821 Acute postprocedural respiratory failure: Secondary | ICD-10-CM | POA: Diagnosis not present

## 2021-10-25 DIAGNOSIS — E872 Acidosis, unspecified: Secondary | ICD-10-CM | POA: Diagnosis not present

## 2021-10-25 DIAGNOSIS — E7800 Pure hypercholesterolemia, unspecified: Secondary | ICD-10-CM | POA: Diagnosis present

## 2021-10-25 DIAGNOSIS — G936 Cerebral edema: Secondary | ICD-10-CM | POA: Diagnosis not present

## 2021-10-25 DIAGNOSIS — M1990 Unspecified osteoarthritis, unspecified site: Secondary | ICD-10-CM | POA: Diagnosis present

## 2021-10-25 DIAGNOSIS — Z79899 Other long term (current) drug therapy: Secondary | ICD-10-CM

## 2021-10-25 DIAGNOSIS — I639 Cerebral infarction, unspecified: Secondary | ICD-10-CM | POA: Diagnosis present

## 2021-10-25 DIAGNOSIS — N390 Urinary tract infection, site not specified: Secondary | ICD-10-CM | POA: Diagnosis present

## 2021-10-25 DIAGNOSIS — Z01818 Encounter for other preprocedural examination: Secondary | ICD-10-CM

## 2021-10-25 DIAGNOSIS — R4189 Other symptoms and signs involving cognitive functions and awareness: Secondary | ICD-10-CM | POA: Diagnosis present

## 2021-10-25 DIAGNOSIS — R4781 Slurred speech: Secondary | ICD-10-CM | POA: Diagnosis present

## 2021-10-25 DIAGNOSIS — G935 Compression of brain: Secondary | ICD-10-CM | POA: Diagnosis present

## 2021-10-25 DIAGNOSIS — Z6831 Body mass index (BMI) 31.0-31.9, adult: Secondary | ICD-10-CM

## 2021-10-25 DIAGNOSIS — Z975 Presence of (intrauterine) contraceptive device: Secondary | ICD-10-CM

## 2021-10-25 DIAGNOSIS — Z87891 Personal history of nicotine dependence: Secondary | ICD-10-CM

## 2021-10-25 DIAGNOSIS — B9620 Unspecified Escherichia coli [E. coli] as the cause of diseases classified elsewhere: Secondary | ICD-10-CM | POA: Diagnosis not present

## 2021-10-25 DIAGNOSIS — N179 Acute kidney failure, unspecified: Secondary | ICD-10-CM | POA: Diagnosis not present

## 2021-10-25 DIAGNOSIS — Z96652 Presence of left artificial knee joint: Secondary | ICD-10-CM | POA: Diagnosis present

## 2021-10-25 DIAGNOSIS — Z8249 Family history of ischemic heart disease and other diseases of the circulatory system: Secondary | ICD-10-CM

## 2021-10-25 DIAGNOSIS — Z8673 Personal history of transient ischemic attack (TIA), and cerebral infarction without residual deficits: Secondary | ICD-10-CM

## 2021-10-25 DIAGNOSIS — K219 Gastro-esophageal reflux disease without esophagitis: Secondary | ICD-10-CM | POA: Diagnosis present

## 2021-10-25 DIAGNOSIS — R1310 Dysphagia, unspecified: Secondary | ICD-10-CM | POA: Diagnosis present

## 2021-10-25 DIAGNOSIS — H9190 Unspecified hearing loss, unspecified ear: Secondary | ICD-10-CM | POA: Diagnosis present

## 2021-10-25 LAB — DIFFERENTIAL
Abs Immature Granulocytes: 0.01 10*3/uL (ref 0.00–0.07)
Basophils Absolute: 0 10*3/uL (ref 0.0–0.1)
Basophils Relative: 1 %
Eosinophils Absolute: 0.1 10*3/uL (ref 0.0–0.5)
Eosinophils Relative: 3 %
Immature Granulocytes: 0 %
Lymphocytes Relative: 22 %
Lymphs Abs: 1.2 10*3/uL (ref 0.7–4.0)
Monocytes Absolute: 0.6 10*3/uL (ref 0.1–1.0)
Monocytes Relative: 10 %
Neutro Abs: 3.7 10*3/uL (ref 1.7–7.7)
Neutrophils Relative %: 64 %

## 2021-10-25 LAB — CBG MONITORING, ED: Glucose-Capillary: 200 mg/dL — ABNORMAL HIGH (ref 70–99)

## 2021-10-25 LAB — COMPREHENSIVE METABOLIC PANEL
ALT: 11 U/L (ref 0–44)
AST: 12 U/L — ABNORMAL LOW (ref 15–41)
Albumin: 4.1 g/dL (ref 3.5–5.0)
Alkaline Phosphatase: 51 U/L (ref 38–126)
Anion gap: 9 (ref 5–15)
BUN: 20 mg/dL (ref 8–23)
CO2: 24 mmol/L (ref 22–32)
Calcium: 9.6 mg/dL (ref 8.9–10.3)
Chloride: 102 mmol/L (ref 98–111)
Creatinine, Ser: 1.09 mg/dL — ABNORMAL HIGH (ref 0.44–1.00)
GFR, Estimated: 51 mL/min — ABNORMAL LOW (ref >60)
Glucose, Bld: 211 mg/dL — ABNORMAL HIGH (ref 70–99)
Potassium: 3.8 mmol/L (ref 3.5–5.1)
Sodium: 135 mmol/L (ref 135–145)
Total Bilirubin: 0.5 mg/dL (ref 0.3–1.2)
Total Protein: 6.9 g/dL (ref 6.5–8.1)

## 2021-10-25 LAB — GLUCOSE, CAPILLARY: Glucose-Capillary: 239 mg/dL — ABNORMAL HIGH (ref 70–99)

## 2021-10-25 LAB — CBC
HCT: 43.1 % (ref 36.0–46.0)
Hemoglobin: 14.1 g/dL (ref 12.0–15.0)
MCH: 30 pg (ref 26.0–34.0)
MCHC: 32.7 g/dL (ref 30.0–36.0)
MCV: 91.7 fL (ref 80.0–100.0)
Platelets: 146 10*3/uL — ABNORMAL LOW (ref 150–400)
RBC: 4.7 MIL/uL (ref 3.87–5.11)
RDW: 14.1 % (ref 11.5–15.5)
WBC: 5.7 10*3/uL (ref 4.0–10.5)
nRBC: 0 % (ref 0.0–0.2)

## 2021-10-25 LAB — ETHANOL: Alcohol, Ethyl (B): <10 mg/dL (ref <10)

## 2021-10-25 LAB — PROTIME-INR
INR: 1.1 (ref 0.8–1.2)
Prothrombin Time: 13.8 seconds (ref 11.4–15.2)

## 2021-10-25 LAB — APTT: aPTT: 25 seconds (ref 24–36)

## 2021-10-25 MED ORDER — DEXAMETHASONE SODIUM PHOSPHATE 4 MG/ML IJ SOLN
4.0000 mg | Freq: Four times a day (QID) | INTRAMUSCULAR | Status: DC
  Administered 2021-10-25 – 2021-10-28 (×8): 4 mg via INTRAVENOUS
  Filled 2021-10-25 (×8): qty 1

## 2021-10-25 MED ORDER — GADOBUTROL 1 MMOL/ML IV SOLN
7.0000 mL | Freq: Once | INTRAVENOUS | Status: AC | PRN
  Administered 2021-10-25: 7 mL via INTRAVENOUS

## 2021-10-25 MED ORDER — DEXAMETHASONE SODIUM PHOSPHATE 4 MG/ML IJ SOLN
4.0000 mg | Freq: Once | INTRAMUSCULAR | Status: AC
  Administered 2021-10-25: 4 mg via INTRAVENOUS
  Filled 2021-10-25: qty 1

## 2021-10-25 MED ORDER — INSULIN ASPART 100 UNIT/ML IJ SOLN
0.0000 [IU] | Freq: Three times a day (TID) | INTRAMUSCULAR | Status: DC
  Administered 2021-10-26: 7 [IU] via SUBCUTANEOUS
  Administered 2021-10-26: 3 [IU] via SUBCUTANEOUS
  Administered 2021-10-27: 9 [IU] via SUBCUTANEOUS

## 2021-10-25 MED ORDER — INSULIN ASPART 100 UNIT/ML IJ SOLN
0.0000 [IU] | Freq: Every day | INTRAMUSCULAR | Status: DC
  Administered 2021-10-25: 2 [IU] via SUBCUTANEOUS
  Administered 2021-10-26: 5 [IU] via SUBCUTANEOUS

## 2021-10-25 MED ORDER — PANTOPRAZOLE SODIUM 40 MG PO TBEC
40.0000 mg | DELAYED_RELEASE_TABLET | Freq: Every day | ORAL | Status: DC
  Administered 2021-10-25 – 2021-10-31 (×7): 40 mg via ORAL
  Filled 2021-10-25 (×7): qty 1

## 2021-10-25 NOTE — ED Triage Notes (Signed)
Pt arrives to ED with c/o slurred speech and weakness for the past two days. Per family the last time they saw pt normal was over 48 hours. Pt reports left sided weakness in arm and leg. Associated symptoms include gait abnormality and mild confusion.

## 2021-10-25 NOTE — Progress Notes (Signed)
Plan of Care Note for accepted transfer   Patient: Cassandra Dodson MRN: [id number]   [place]: 10/25/2021  Facility requesting transfer: [facility] Requesting Provider: Dr. Langston Masker  Reason for transfer: brain mass with symptoms  Facility course: 81 year old with history of HTN, T2DM, hx of endometrial cancer, GERD, hypothyroidism, CKD stage 3 presenting to ED with complaints of slurred speech and left sided weakness x 2 days. Per EDP no weakness on exam and speaking clearly.   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 Frontal horn of the left lateral ventricle is partially effaced.  In ED neurosurgery consulted (Dr. Ellene Route). Recommended MRI with and without contrast and consult with steroids overnight and will need to be on steroids long term. Brittle diabetic so insulin will need to be adjusted.   Plan of care: The patient is accepted for admission to [place]  unit, at [facility]..  Let neurosurgery know when she arrives.   Author: Orma Flaming, MD 10/25/2021  Check [email or web address] for on-call coverage.  Nursing staff, Please call [place] number on Amion as soon as patient's arrival, so appropriate admitting provider can evaluate the pt.

## 2021-10-25 NOTE — ED Provider Notes (Signed)
[facility] Provider Note   CSN: [id number] Arrival date & time: 10/25/21  1518     History  Chief Complaint  Patient presents with   Weakness   Slurred Speech    Cassandra Dodson is a 81 y.o. female with a history of high cholesterol, diabetes, hypertension, presenting to emergency department company of her son with concern for weakness and slurred speech.  Her son provides majority of the history.  The patient does have some beginning stages of dementia, but is still living independently in an apartment.  His son checks on her twice a day and administers all of her medications including her insulin.  He reports that on 4 July, the patient was behaving normally, on 5 July he went to visit her and found that she was sitting in a very hot apartment, having around the heat overnight and forgot to turn it off in the daytime.  He felt that her speech was a little sluggish and she seems weaker than normal.  He helped her cool off.  Today he was concerned that she continued to seem to have some weakness in her right lower side, and her speech still seems somewhat slurred to him.  She has not have any other complaints.  She denies headache.  They deny that she has any history of TIA or stroke.  She denies chest pain.  Her son called the PCPs office but advised to come to the ED for evaluation.  HPI     Home Medications Prior to Admission medications   Medication Sig Start Date End Date Taking? Authorizing Provider  ALPRAZolam Duanne Moron) 0.5 MG tablet Take 0.25-0.5 mg by mouth at bedtime as needed. 09/21/21   [provider]  aspirin EC 81 MG tablet Take 81 mg by mouth daily. Swallow whole.    [provider]  calcium carbonate (OSCAL) 1500 (600 Ca) MG TABS tablet Take by mouth daily with breakfast.    [provider]  fenofibrate 160 MG tablet Take 160 mg by mouth daily after breakfast.  05/12/16   [provider]  HUMALOG MIX 75/25  KWIKPEN (75-25) 100 UNIT/ML Kwikpen Inject 24-30 Units into the skin See admin instructions. Inject 36units subcutaneously in the morning & inject 24 units subcutaneously in the evening. 06/08/16   [provider]  metFORMIN (GLUCOPHAGE) 1000 MG tablet Take 1,000 mg by mouth 2 (two) times daily.  01/21/15   [provider]  telmisartan (MICARDIS) 80 MG tablet Take 80 mg by mouth daily after breakfast.  06/01/19   [provider]      Allergies    Anesthetics, amide and Ether    Review of Systems   Review of Systems  Physical Exam Updated Vital Signs BP (!) 165/100   Pulse 77   Temp 98.1 F (36.7 C)   Resp 17   Ht '5\' 5"'$  (1.651 m)   Wt 76.7 kg   SpO2 96%   BMI 28.12 kg/m  Physical Exam Constitutional:      General: She is not in acute distress.    Comments: Hard of hearing  HENT:     Head: Normocephalic and atraumatic.  Eyes:     Conjunctiva/sclera: Conjunctivae normal.     Pupils: Pupils are equal, round, and reactive to light.  Cardiovascular:     Rate and Rhythm: Normal rate and regular rhythm.  Pulmonary:     Effort: Pulmonary effort is normal. No respiratory distress.  Abdominal:  General: There is no distension.     Tenderness: There is no abdominal tenderness.  Musculoskeletal:     Right lower leg: Edema present.     Left lower leg: Edema present.  Skin:    General: Skin is warm and dry.  Neurological:     General: No focal deficit present.     Mental Status: She is alert and oriented to person, place, and time. Mental status is at baseline.     Cranial Nerves: No cranial nerve deficit.     Sensory: No sensory deficit.     Motor: No weakness.     ED Results / Procedures / Treatments   Labs (all labs ordered are listed, but only abnormal results are displayed) Labs Reviewed  CBC - Abnormal; Notable for the following components:      Result Value   Platelets 146 (*)    All other components within normal limits  COMPREHENSIVE  METABOLIC PANEL - Abnormal; Notable for the following components:   Glucose, Bld 211 (*)    Creatinine, Ser 1.09 (*)    AST 12 (*)    GFR, Estimated 51 (*)    All other components within normal limits  CBG MONITORING, ED - Abnormal; Notable for the following components:   Glucose-Capillary 200 (*)    All other components within normal limits  PROTIME-INR  APTT  DIFFERENTIAL  ETHANOL  URINALYSIS, ROUTINE W REFLEX MICROSCOPIC    EKG EKG Interpretation  Date/Time:  Thursday October 25 2021 15:33:07 EDT Ventricular Rate:  77 PR Interval:  178 QRS Duration: 70 QT Interval:  378 QTC Calculation: 427 R Axis:   85 Text Interpretation: Sinus rhythm with Premature supraventricular complexes Otherwise normal ECG When compared with ECG of 11-Jun-2019 14:59, No sig changes Confirmed by Octaviano Glow [id number]) on 10/25/2021 4:24:40 PM  Radiology CT HEAD WO CONTRAST  Result Date: 10/25/2021 CLINICAL DATA:  Slurred speech and increased confusion. History of hypertension, diabetes, and endometrial cancer. EXAM: CT HEAD WITHOUT CONTRAST TECHNIQUE: Contiguous axial images were obtained from the base of the skull through the vertex without intravenous contrast. RADIATION DOSE REDUCTION: This exam was performed according to the departmental dose-optimization program which includes automated exposure control, adjustment of the mA and/or kV according to patient size and/or use of iterative reconstruction technique. COMPARISON:  None Available. FINDINGS: Brain: Sharply defined low-density encephalomalacia compatible with small remote infarct of the right inferior cerebellum, image 8 series 2. A high density 5.0 by 3.8 by 3.6 cm mass primarily centered in the left anterior cranial fossa but abuts the planum sphenoidale and anterior falx, with 2.0 cm of localized left-to-right midline shift and with extensive vasogenic edema in the left frontal lobe and a small amount of edema tracking into the left temporal lobe.  Effacement of part of the frontal horn left lateral ventricle. This mass is highly likely to be a meningioma. No overt hydrocephalus. No intracranial hemorrhage or definite findings of acute CVA. Vascular: There is atherosclerotic calcification of the cavernous carotid arteries bilaterally. Skull: Unremarkable Sinuses/Orbits: Mild chronic ethmoid sinusitis. Other: No supplemental non-categorized findings. IMPRESSION: 1. 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 Frontal horn of the left lateral ventricle is partially effaced. 2. 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 3. Atherosclerosis. 4. Mild chronic ethmoid sinusitis. Electronically Signed  By: Van Clines M.D.   On: 10/25/2021 16:27    Procedures Procedures    Medications Ordered in ED Medications  dexamethasone (DECADRON) injection 4 mg (4 mg Intravenous Given 10/25/21 1808)    ED Course/ Medical Decision Making/ A&P Clinical Course as of 10/25/21 1946  Thu Oct 25, 2021  1638 Ct concerning for meningioma with vasogenic edema - will discuss with NSGY [MT]  1736 I spoke to Dr Ellene Route from [place] who recommends that the patient have an MRI of the brain with and without contrast, also will need to be started on chronic Decadron steroids, given that she is a brittle diabetic, the lowest dose would be 2 mg twice a day, but ideally a higher dose.  The patient and her son were informed about the results and are amenable to staying in the hospital.  We will start on 4 mg IV Decadron for now, I discussed the plan with the inpatient team, and we will need to continue monitoring her blood sugars while inpatient.  Patient admitted to the hospitalist [MT]    Clinical Course User Index [MT] Aleksa Catterton, Carola Rhine, MD                            Medical Decision Making Amount and/or Complexity of Data Reviewed Labs: ordered. Radiology: ordered.  Risk Prescription drug management. Decision regarding hospitalization.   This patient presents to the Emergency Department with complaint of altered mental status.  This involves an extensive number of treatment options, and is a complaint that carries with it a high risk of complications and morbidity.  The differential diagnosis includes hypoglycemia vs metabolic encephalopathy vs infection (including cystitis) vs ICH vs stroke vs polypharmacy vs other  I ordered, reviewed, and interpreted labs, including CBC and CMP largely unremarkable I ordered medication Decadron for brain swelling I ordered imaging studies which included CT scan of the brain I independently visualized and interpreted imaging which showed intracranial mass with vasogenic edema and the monitor tracing which showed regular heart Additional history was obtained from patient's son at bedside I personally reviewed the patients ECG which showed sinus rhythm with no acute ischemic findings  With a normal EKG and no chest pain, I doubt ACS at this time.  Also the lower suspicion for acute pulmonary embolism.  No hypoxia or respiratory complaints.  I do not believe need emergent CT PE scan.  I consulted neurosurgery and discussed lab and imaging findings -see ED course  After the interventions stated above, I reevaluated the patient and found that they remained asymptomatic or clinically stable for medical admission.         Final Clinical Impression(s) / ED Diagnoses Final diagnoses:  Intracranial mass    Rx / DC Orders ED Discharge Orders     None         Nussen Pullin, Carola Rhine, MD 10/25/21 1946

## 2021-10-25 NOTE — H&P (Signed)
History and Physical  [name] ZES:[id number] DOB: December 24, 1940 DOA: 10/25/2021  Referring physician: Accepted by Dr. Rogers Blocker [facility], hospitalist service. PCP: Shon Baton, MD  Outpatient Specialists: Gynecology oncology Patient coming from: Home through [place] ED  Chief Complaint: Slurred speech, left-sided weakness x2 days  HPI: Cassandra Dodson is a 81 y.o. female with medical history significant for endometrial cancer, essential hypertension, type 2 diabetes, GERD, hypothyroidism, CKD 3B who initially presented to [facility] ED with complaints of slurred speech, left-sided weakness x2 days.  Associated with mild confusion.  No headache.  Noncontrast head CT done in the ED revealed 5.0 cm long axis intracranial mass highly likely to be a meningioma associated with 2.0 cm localized left to right midline shift with extensive vasogenic edema in the left frontal lobe.  EDP discussed the case with neurosurgery Dr. Ellene Route who recommended MRI brain with and without contrast and to add IV Decadron for the vasogenic edema.  Upon assessment at [facility], the patient is alert, follows commands, denies any headaches.  ED Course: Tmax 98.6.  BP 170/93, pulse 68, respiratory rate 29, saturation 96% on room air.  Lab studies significant for glucose 211, BUN 20, creatinine 1.09 GFR 51.  CBC essentially unremarkable except for platelet count of 146.  Review of Systems: Review of systems as noted in the HPI. All other systems reviewed and are negative.   Past Medical History:  Diagnosis Date   Arthritis    CKD (chronic kidney disease), stage III ([place])    patient denies   Depression    Diabetes mellitus without complication ([place])    Difficult intravenous access    Endometrial cancer ([place])    GERD (gastroesophageal reflux disease)    Headache    History of radiation therapy 11/04/2019-12/01/2019   Endometrial HDR; Dr. Gery Pray   Hypertension    Hypothyroidism    Neuromuscular disorder  ([place])    neuropathy in feet   Osteoarthritis    PMB (postmenopausal bleeding)    PONV (postoperative nausea and vomiting)    severe nausea and vomiting after knee replacement 06-2014, did ok with 2018 knee replacement   Urinary frequency    Wears glasses    Past Surgical History:  Procedure Laterality Date   BREAST SURGERY     cyst removed   CESAREAN SECTION     DILATION AND CURETTAGE OF UTERUS N/A 06/24/2019   Procedure: DILATATION AND CURETTAGE;  Surgeon: Lafonda Mosses, MD;  Location: [facility];  Service: Gynecology;  Laterality: N/A;   FRACTURE SURGERY     right knee   INTRAUTERINE DEVICE (IUD) INSERTION N/A 06/24/2019   Procedure: INTRAUTERINE DEVICE (IUD) INSERTION MIRENA;  Surgeon: Lafonda Mosses, MD;  Location: [facility];  Service: Gynecology;  Laterality: N/A;   IRRIGATION AND DEBRIDEMENT SEBACEOUS CYST     JOINT REPLACEMENT Right    right   LYMPH NODE DISSECTION N/A 09/14/2019   Procedure: LYMPH NODE DISSECTION;  Surgeon: Lafonda Mosses, MD;  Location: [facility];  Service: Gynecology;  Laterality: N/A;   ROBOTIC ASSISTED TOTAL HYSTERECTOMY WITH BILATERAL SALPINGO OOPHERECTOMY Bilateral 09/14/2019   Procedure: XI ROBOTIC ASSISTED TOTAL HYSTERECTOMY WITH BILATERAL SALPINGO OOPHORECTOMY;  Surgeon: Lafonda Mosses, MD;  Location: [facility];  Service: Gynecology;  Laterality: Bilateral;   SENTINEL NODE BIOPSY N/A 09/14/2019   Procedure: SENTINEL NODE BIOPSY;  Surgeon: Lafonda Mosses, MD;  Location: [facility];  Service: Gynecology;  Laterality: N/A;   teeth extration  TONSILLECTOMY     TOTAL HIP ARTHROPLASTY Right 02/23/2015   Procedure: RIGHT TOTAL HIP ARTHROPLASTY ANTERIOR APPROACH;  Surgeon: Leandrew Koyanagi, MD;  Location: [facility];  Service: Orthopedics;  Laterality: Right;   TOTAL KNEE ARTHROPLASTY Left 06/27/2016   Procedure: LEFT TOTAL KNEE ARTHROPLASTY;  Surgeon: Leandrew Koyanagi, MD;  Location: [facility];  Service: Orthopedics;  Laterality:  Left;    Social History:  reports that she quit smoking about 19 years ago. Her smoking use included cigarettes. She has a 67.50 pack-year smoking history. She has never used smokeless tobacco. She reports current alcohol use. She reports that she does not use drugs.   Allergies  Allergen Reactions   Anesthetics, Amide Other (See Comments)    Pt is intolerant to general anesthesia. Pt will throw up and has thrown up during procedure.    Ether Nausea And Vomiting    UNSPECIFIED REACTION     Family History  Problem Relation Age of Onset   Pancreatic cancer Mother    Stroke Father    Hypertension Father    Colon cancer Neg Hx    Breast cancer Neg Hx    Ovarian cancer Neg Hx    Endometrial cancer Neg Hx    Prostate cancer Neg Hx       Prior to Admission medications   Medication Sig Start Date End Date Taking? Authorizing Provider  ALPRAZolam Duanne Moron) 0.5 MG tablet Take 0.25-0.5 mg by mouth at bedtime as needed. 09/21/21   [provider]  aspirin EC 81 MG tablet Take 81 mg by mouth daily. Swallow whole.    [provider]  calcium carbonate (OSCAL) 1500 (600 Ca) MG TABS tablet Take by mouth daily with breakfast.    [provider]  fenofibrate 160 MG tablet Take 160 mg by mouth daily after breakfast.  05/12/16   [provider]  HUMALOG MIX 75/25 KWIKPEN (75-25) 100 UNIT/ML Kwikpen Inject 24-30 Units into the skin See admin instructions. Inject 36units subcutaneously in the morning & inject 24 units subcutaneously in the evening. 06/08/16   [provider]  metFORMIN (GLUCOPHAGE) 1000 MG tablet Take 1,000 mg by mouth 2 (two) times daily.  01/21/15   [provider]  telmisartan (MICARDIS) 80 MG tablet Take 80 mg by mouth daily after breakfast.  06/01/19   [provider]    Physical Exam: BP (!) 170/93 (BP Location: Right Arm)   Pulse 68   Temp 98.6 F (37 C) (Oral)   Resp 18   Ht '5\' 5"'$  (1.651 m)   Wt 76.7 kg   SpO2 95%    BMI 28.12 kg/m   General: 81 y.o. year-old female well developed well nourished in no acute distress.  Alert and interactive. Cardiovascular: Regular rate and rhythm with no rubs or gallops.  No thyromegaly or JVD noted.  No lower extremity edema. 2/4 pulses in all 4 extremities. Respiratory: Clear to auscultation with no wheezes or rales. Good inspiratory effort. Abdomen: Soft nontender nondistended with normal bowel sounds x4 quadrants. Muskuloskeletal: No cyanosis, clubbing or edema noted bilaterally Neuro: CN II-XII intact, strength, sensation, reflexes Skin: No ulcerative lesions noted or rashes Psychiatry: Judgement and insight appear normal. Mood is appropriate for condition and setting          Labs on Admission:  Basic Metabolic Panel: Recent Labs  Lab 10/25/21 1540  NA 135  K 3.8  CL 102  CO2 24  GLUCOSE 211*  BUN 20  CREATININE 1.09*  CALCIUM 9.6   Liver Function Tests: Recent Labs  Lab 10/25/21 1540  AST 12*  ALT 11  ALKPHOS 51  BILITOT 0.5  PROT 6.9  ALBUMIN 4.1   No results for input(s): "LIPASE", "AMYLASE" in the last 168 hours. No results for input(s): "AMMONIA" in the last 168 hours. CBC: Recent Labs  Lab 10/25/21 1540  WBC 5.7  NEUTROABS 3.7  HGB 14.1  HCT 43.1  MCV 91.7  PLT 146*   Cardiac Enzymes: No results for input(s): "CKTOTAL", "CKMB", "CKMBINDEX", "TROPONINI" in the last 168 hours.  BNP (last 3 results) No results for input(s): "BNP" in the last 8760 hours.  ProBNP (last 3 results) No results for input(s): "PROBNP" in the last 8760 hours.  CBG: Recent Labs  Lab 10/25/21 1537  GLUCAP 200*    Radiological Exams on Admission: CT HEAD WO CONTRAST  Result Date: 10/25/2021 CLINICAL DATA:  Slurred speech and increased confusion. History of hypertension, diabetes, and endometrial cancer. EXAM: CT HEAD WITHOUT CONTRAST TECHNIQUE: Contiguous axial images were obtained from the base of the skull through the vertex without  intravenous contrast. RADIATION DOSE REDUCTION: This exam was performed according to the departmental dose-optimization program which includes automated exposure control, adjustment of the mA and/or kV according to patient size and/or use of iterative reconstruction technique. COMPARISON:  None Available. FINDINGS: Brain: Sharply defined low-density encephalomalacia compatible with small remote infarct of the right inferior cerebellum, image 8 series 2. A high density 5.0 by 3.8 by 3.6 cm mass primarily centered in the left anterior cranial fossa but abuts the planum sphenoidale and anterior falx, with 2.0 cm of localized left-to-right midline shift and with extensive vasogenic edema in the left frontal lobe and a small amount of edema tracking into the left temporal lobe. Effacement of part of the frontal horn left lateral ventricle. This mass is highly likely to be a meningioma. No overt hydrocephalus. No intracranial hemorrhage or definite findings of acute CVA. Vascular: There is atherosclerotic calcification of the cavernous carotid arteries bilaterally. Skull: Unremarkable Sinuses/Orbits: Mild chronic ethmoid sinusitis. Other: No supplemental non-categorized findings. IMPRESSION: 1. 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 Frontal horn of the left lateral ventricle is partially effaced. 2. 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 3. Atherosclerosis. 4. Mild chronic ethmoid sinusitis. Electronically Signed   By: Van Clines M.D.   On: 10/25/2021 16:27    EKG: I independently viewed the EKG done and my findings are as followed: Sinus rhythm with PVCs.  Rate 77.  Nonspecific ST-T changes.  QTc 427.  Assessment/Plan Present on Admission: **None**  Principal Problem:   Brain  mass  Intracranial mass, seen on CT scan Presented with left-sided weakness and slurred speech. Noncontrast CT done in the ED showed 5.0 cm in long axis left paracentral intracranial mass along the planum sphenoidal and anterior falx highly likely to be a meningioma.  Associated with 2.0 cm localized left to right midline shift.  Extensive vasogenic edema in the left frontal lobe. Started on IV Decadron 4 mg every 6 hours with neurosurgery's guidance. Continue Decadron, added GI prophylaxis, po Protonix 40 mg daily Obtain MRI brain with and without contrast as recommended by neurosurgery.  Type 2 diabetes with hyperglycemia Likely exacerbated by IV steroids Obtain hemoglobin A1c Start insulin sliding scale.  CKD  3B Baseline creatinine appears to be 1.3 with GFR of 38. Monitor urine output Avoid nephrotoxic agents, dehydration and hypotension  Acute thrombocytopenia Presented with platelet count of 146 Monitor for now  History of endometrial cancer Follows with Dr. Berline Lopes, last visit was in October 11, 2021    DVT prophylaxis: SCDs  Code Status: Full code  Family Communication:  None at bedside  Disposition Plan: Admitted to [place] unit by Dr. Sharee Holster called: Neurosurgery consulted by EDP  Admission status: Observation status   Status is: Observation    Kayleen Memos MD [facility] Pager [phone]  If 7PM-7AM, please contact night-coverage [email or web address] Password [facility]  10/25/2021, 9:16 PM

## 2021-10-26 ENCOUNTER — Observation Stay (HOSPITAL_COMMUNITY): Payer: Medicare Other

## 2021-10-26 DIAGNOSIS — L89151 Pressure ulcer of sacral region, stage 1: Secondary | ICD-10-CM | POA: Diagnosis present

## 2021-10-26 DIAGNOSIS — G9389 Other specified disorders of brain: Secondary | ICD-10-CM | POA: Diagnosis not present

## 2021-10-26 DIAGNOSIS — R5383 Other fatigue: Secondary | ICD-10-CM | POA: Diagnosis not present

## 2021-10-26 DIAGNOSIS — Z8542 Personal history of malignant neoplasm of other parts of uterus: Secondary | ICD-10-CM | POA: Diagnosis not present

## 2021-10-26 DIAGNOSIS — E1122 Type 2 diabetes mellitus with diabetic chronic kidney disease: Secondary | ICD-10-CM | POA: Diagnosis present

## 2021-10-26 DIAGNOSIS — Z7982 Long term (current) use of aspirin: Secondary | ICD-10-CM | POA: Diagnosis not present

## 2021-10-26 DIAGNOSIS — I639 Cerebral infarction, unspecified: Secondary | ICD-10-CM

## 2021-10-26 DIAGNOSIS — R900 Intracranial space-occupying lesion found on diagnostic imaging of central nervous system: Secondary | ICD-10-CM | POA: Diagnosis present

## 2021-10-26 DIAGNOSIS — I6389 Other cerebral infarction: Secondary | ICD-10-CM

## 2021-10-26 DIAGNOSIS — E782 Mixed hyperlipidemia: Secondary | ICD-10-CM | POA: Diagnosis not present

## 2021-10-26 DIAGNOSIS — F0390 Unspecified dementia without behavioral disturbance: Secondary | ICD-10-CM | POA: Diagnosis present

## 2021-10-26 DIAGNOSIS — J95821 Acute postprocedural respiratory failure: Secondary | ICD-10-CM | POA: Diagnosis not present

## 2021-10-26 DIAGNOSIS — G935 Compression of brain: Secondary | ICD-10-CM | POA: Diagnosis present

## 2021-10-26 DIAGNOSIS — I129 Hypertensive chronic kidney disease with stage 1 through stage 4 chronic kidney disease, or unspecified chronic kidney disease: Secondary | ICD-10-CM | POA: Diagnosis present

## 2021-10-26 DIAGNOSIS — I6322 Cerebral infarction due to unspecified occlusion or stenosis of basilar arteries: Secondary | ICD-10-CM | POA: Diagnosis present

## 2021-10-26 DIAGNOSIS — R4702 Dysphasia: Secondary | ICD-10-CM | POA: Diagnosis not present

## 2021-10-26 DIAGNOSIS — Z7189 Other specified counseling: Secondary | ICD-10-CM | POA: Diagnosis not present

## 2021-10-26 DIAGNOSIS — N1832 Chronic kidney disease, stage 3b: Secondary | ICD-10-CM | POA: Diagnosis present

## 2021-10-26 DIAGNOSIS — R1312 Dysphagia, oropharyngeal phase: Secondary | ICD-10-CM | POA: Diagnosis not present

## 2021-10-26 DIAGNOSIS — D320 Benign neoplasm of cerebral meninges: Secondary | ICD-10-CM | POA: Diagnosis present

## 2021-10-26 DIAGNOSIS — G8194 Hemiplegia, unspecified affecting left nondominant side: Secondary | ICD-10-CM | POA: Diagnosis present

## 2021-10-26 DIAGNOSIS — I6350 Cerebral infarction due to unspecified occlusion or stenosis of unspecified cerebral artery: Secondary | ICD-10-CM | POA: Diagnosis not present

## 2021-10-26 DIAGNOSIS — E7800 Pure hypercholesterolemia, unspecified: Secondary | ICD-10-CM | POA: Diagnosis present

## 2021-10-26 DIAGNOSIS — N390 Urinary tract infection, site not specified: Secondary | ICD-10-CM | POA: Diagnosis present

## 2021-10-26 DIAGNOSIS — E1169 Type 2 diabetes mellitus with other specified complication: Secondary | ICD-10-CM | POA: Diagnosis not present

## 2021-10-26 DIAGNOSIS — E669 Obesity, unspecified: Secondary | ICD-10-CM | POA: Diagnosis not present

## 2021-10-26 DIAGNOSIS — Z87891 Personal history of nicotine dependence: Secondary | ICD-10-CM | POA: Diagnosis not present

## 2021-10-26 DIAGNOSIS — D696 Thrombocytopenia, unspecified: Secondary | ICD-10-CM | POA: Diagnosis present

## 2021-10-26 DIAGNOSIS — G9340 Encephalopathy, unspecified: Secondary | ICD-10-CM | POA: Diagnosis present

## 2021-10-26 DIAGNOSIS — Z794 Long term (current) use of insulin: Secondary | ICD-10-CM | POA: Diagnosis not present

## 2021-10-26 DIAGNOSIS — D329 Benign neoplasm of meninges, unspecified: Secondary | ICD-10-CM | POA: Diagnosis not present

## 2021-10-26 DIAGNOSIS — I63531 Cerebral infarction due to unspecified occlusion or stenosis of right posterior cerebral artery: Secondary | ICD-10-CM | POA: Diagnosis not present

## 2021-10-26 DIAGNOSIS — N3000 Acute cystitis without hematuria: Secondary | ICD-10-CM | POA: Diagnosis not present

## 2021-10-26 DIAGNOSIS — E871 Hypo-osmolality and hyponatremia: Secondary | ICD-10-CM | POA: Diagnosis not present

## 2021-10-26 DIAGNOSIS — E1140 Type 2 diabetes mellitus with diabetic neuropathy, unspecified: Secondary | ICD-10-CM | POA: Diagnosis not present

## 2021-10-26 DIAGNOSIS — Z515 Encounter for palliative care: Secondary | ICD-10-CM | POA: Diagnosis not present

## 2021-10-26 DIAGNOSIS — N179 Acute kidney failure, unspecified: Secondary | ICD-10-CM | POA: Diagnosis not present

## 2021-10-26 DIAGNOSIS — I651 Occlusion and stenosis of basilar artery: Secondary | ICD-10-CM | POA: Diagnosis not present

## 2021-10-26 DIAGNOSIS — N183 Chronic kidney disease, stage 3 unspecified: Secondary | ICD-10-CM | POA: Diagnosis not present

## 2021-10-26 DIAGNOSIS — E039 Hypothyroidism, unspecified: Secondary | ICD-10-CM | POA: Diagnosis present

## 2021-10-26 DIAGNOSIS — Z6831 Body mass index (BMI) 31.0-31.9, adult: Secondary | ICD-10-CM | POA: Diagnosis not present

## 2021-10-26 DIAGNOSIS — D649 Anemia, unspecified: Secondary | ICD-10-CM | POA: Diagnosis not present

## 2021-10-26 DIAGNOSIS — B9620 Unspecified Escherichia coli [E. coli] as the cause of diseases classified elsewhere: Secondary | ICD-10-CM | POA: Diagnosis present

## 2021-10-26 DIAGNOSIS — G936 Cerebral edema: Secondary | ICD-10-CM | POA: Diagnosis present

## 2021-10-26 DIAGNOSIS — E785 Hyperlipidemia, unspecified: Secondary | ICD-10-CM | POA: Diagnosis not present

## 2021-10-26 DIAGNOSIS — E872 Acidosis, unspecified: Secondary | ICD-10-CM | POA: Diagnosis not present

## 2021-10-26 DIAGNOSIS — E11649 Type 2 diabetes mellitus with hypoglycemia without coma: Secondary | ICD-10-CM | POA: Diagnosis not present

## 2021-10-26 DIAGNOSIS — I10 Essential (primary) hypertension: Secondary | ICD-10-CM | POA: Diagnosis not present

## 2021-10-26 LAB — CBC WITH DIFFERENTIAL/PLATELET
Abs Immature Granulocytes: 0.02 10*3/uL (ref 0.00–0.07)
Basophils Absolute: 0 10*3/uL (ref 0.0–0.1)
Basophils Relative: 0 %
Eosinophils Absolute: 0 10*3/uL (ref 0.0–0.5)
Eosinophils Relative: 0 %
HCT: 42.6 % (ref 36.0–46.0)
Hemoglobin: 14.5 g/dL (ref 12.0–15.0)
Immature Granulocytes: 0 %
Lymphocytes Relative: 9 %
Lymphs Abs: 0.5 10*3/uL — ABNORMAL LOW (ref 0.7–4.0)
MCH: 30.6 pg (ref 26.0–34.0)
MCHC: 34 g/dL (ref 30.0–36.0)
MCV: 89.9 fL (ref 80.0–100.0)
Monocytes Absolute: 0.1 10*3/uL (ref 0.1–1.0)
Monocytes Relative: 2 %
Neutro Abs: 5.1 10*3/uL (ref 1.7–7.7)
Neutrophils Relative %: 89 %
Platelets: 139 10*3/uL — ABNORMAL LOW (ref 150–400)
RBC: 4.74 MIL/uL (ref 3.87–5.11)
RDW: 13.9 % (ref 11.5–15.5)
WBC: 5.8 10*3/uL (ref 4.0–10.5)
nRBC: 0 % (ref 0.0–0.2)

## 2021-10-26 LAB — COMPREHENSIVE METABOLIC PANEL
ALT: 14 U/L (ref 0–44)
AST: 15 U/L (ref 15–41)
Albumin: 3.6 g/dL (ref 3.5–5.0)
Alkaline Phosphatase: 51 U/L (ref 38–126)
Anion gap: 12 (ref 5–15)
BUN: 17 mg/dL (ref 8–23)
CO2: 22 mmol/L (ref 22–32)
Calcium: 9.2 mg/dL (ref 8.9–10.3)
Chloride: 104 mmol/L (ref 98–111)
Creatinine, Ser: 0.96 mg/dL (ref 0.44–1.00)
GFR, Estimated: 59 mL/min — ABNORMAL LOW (ref >60)
Glucose, Bld: 251 mg/dL — ABNORMAL HIGH (ref 70–99)
Potassium: 3.9 mmol/L (ref 3.5–5.1)
Sodium: 138 mmol/L (ref 135–145)
Total Bilirubin: 0.8 mg/dL (ref 0.3–1.2)
Total Protein: 6.7 g/dL (ref 6.5–8.1)

## 2021-10-26 LAB — URINALYSIS, COMPLETE (UACMP) WITH MICROSCOPIC
Bilirubin Urine: NEGATIVE
Glucose, UA: >=500 mg/dL — AB
Ketones, ur: 5 mg/dL — AB
Nitrite: NEGATIVE
Protein, ur: 30 mg/dL — AB
Specific Gravity, Urine: >1.046 — ABNORMAL HIGH (ref 1.005–1.030)
WBC, UA: >50 WBC/hpf — ABNORMAL HIGH (ref 0–5)
pH: 5 (ref 5.0–8.0)

## 2021-10-26 LAB — GLUCOSE, CAPILLARY
Glucose-Capillary: 247 mg/dL — ABNORMAL HIGH (ref 70–99)
Glucose-Capillary: 317 mg/dL — ABNORMAL HIGH (ref 70–99)
Glucose-Capillary: 331 mg/dL — ABNORMAL HIGH (ref 70–99)
Glucose-Capillary: 374 mg/dL — ABNORMAL HIGH (ref 70–99)

## 2021-10-26 LAB — ECHOCARDIOGRAM COMPLETE
Area-P 1/2: 4.19 cm2
Height: 65 in
S' Lateral: 2.3 cm
Weight: 2704 oz

## 2021-10-26 LAB — TSH: TSH: 0.993 u[IU]/mL (ref 0.350–4.500)

## 2021-10-26 LAB — FOLATE: Folate: 5.8 ng/mL — ABNORMAL LOW (ref >5.9)

## 2021-10-26 LAB — PHOSPHORUS: Phosphorus: 2.8 mg/dL (ref 2.5–4.6)

## 2021-10-26 LAB — MAGNESIUM: Magnesium: 1.8 mg/dL (ref 1.7–2.4)

## 2021-10-26 LAB — VITAMIN B12: Vitamin B-12: 287 pg/mL (ref 180–914)

## 2021-10-26 LAB — HEMOGLOBIN A1C
Hgb A1c MFr Bld: 7 % — ABNORMAL HIGH (ref 4.8–5.6)
Mean Plasma Glucose: 154.2 mg/dL

## 2021-10-26 MED ORDER — IOHEXOL 350 MG/ML SOLN
75.0000 mL | Freq: Once | INTRAVENOUS | Status: AC | PRN
  Administered 2021-10-26: 75 mL via INTRAVENOUS

## 2021-10-26 MED ORDER — FENOFIBRATE 160 MG PO TABS
160.0000 mg | ORAL_TABLET | Freq: Every day | ORAL | Status: DC
Start: 2021-10-26 — End: 2021-11-02
  Administered 2021-10-26 – 2021-10-31 (×6): 160 mg via ORAL
  Filled 2021-10-26 (×7): qty 1

## 2021-10-26 MED ORDER — HYDRALAZINE HCL 20 MG/ML IJ SOLN
10.0000 mg | Freq: Four times a day (QID) | INTRAMUSCULAR | Status: DC | PRN
Start: 2021-10-26 — End: 2021-11-05
  Administered 2021-10-26 – 2021-11-03 (×5): 10 mg via INTRAVENOUS
  Filled 2021-10-26 (×5): qty 1

## 2021-10-26 MED ORDER — ASPIRIN 81 MG PO TBEC
81.0000 mg | DELAYED_RELEASE_TABLET | Freq: Every day | ORAL | Status: DC
  Administered 2021-10-26 – 2021-11-01 (×7): 81 mg via ORAL
  Filled 2021-10-26 (×7): qty 1

## 2021-10-26 MED ORDER — INSULIN GLARGINE-YFGN 100 UNIT/ML ~~LOC~~ SOLN
20.0000 [IU] | Freq: Every day | SUBCUTANEOUS | Status: DC
  Administered 2021-10-26: 20 [IU] via SUBCUTANEOUS
  Filled 2021-10-26 (×3): qty 0.2

## 2021-10-26 MED ORDER — INSULIN GLARGINE-YFGN 100 UNIT/ML ~~LOC~~ SOLN: 15.0000 [IU] | Freq: Every day | SUBCUTANEOUS | Status: DC

## 2021-10-26 MED ORDER — CLOPIDOGREL BISULFATE 75 MG PO TABS
75.0000 mg | ORAL_TABLET | Freq: Every day | ORAL | Status: DC
  Administered 2021-10-27 – 2021-11-01 (×6): 75 mg via ORAL
  Filled 2021-10-26 (×6): qty 1

## 2021-10-26 MED ORDER — STROKE: EARLY STAGES OF RECOVERY BOOK
Freq: Once | Status: AC
  Filled 2021-10-26: qty 1

## 2021-10-26 MED ORDER — IRBESARTAN 75 MG PO TABS
75.0000 mg | ORAL_TABLET | Freq: Every day | ORAL | Status: DC
  Administered 2021-10-26: 75 mg via ORAL
  Filled 2021-10-26: qty 1

## 2021-10-26 MED ORDER — IRBESARTAN 300 MG PO TABS: 300.0000 mg | ORAL_TABLET | Freq: Every day | ORAL | Status: DC

## 2021-10-26 MED ORDER — ALPRAZOLAM 0.25 MG PO TABS
0.2500 mg | ORAL_TABLET | Freq: Every evening | ORAL | Status: DC | PRN
  Administered 2021-10-26 – 2021-10-29 (×3): 0.5 mg via ORAL
  Filled 2021-10-26 (×4): qty 2

## 2021-10-26 MED ORDER — CALCIUM CARBONATE 1250 (500 CA) MG PO TABS
1250.0000 mg | ORAL_TABLET | Freq: Every day | ORAL | Status: DC
  Administered 2021-10-26 – 2021-10-31 (×6): 1250 mg via ORAL
  Filled 2021-10-26 (×9): qty 1

## 2021-10-26 NOTE — Consult Note (Signed)
Reason for Consult: Frontal meningioma Referring Physician: Joya Gaskins, MD  Cassandra Dodson is an 81 y.o. female.  HPI: Patient is an 81 year old right-handed individual who had episodes of slurred speech and weakness in involving her left side.  She was seen at [facility] and a CT scan demonstrated presence of a large left frontal mass.  Patient has been transferred here and last night at about 11 PM an MRI of the brain was performed.  This demonstrated the meningioma as expected which measures over 5 cm in diameter in the left frontal region with significant left-to-right shift.  Moreover a right pontine infarct was noted that is acute or subacute.  The patient's neurologic status is such that she appears awake and alert she is moving all 4 extremities but does have some mild left-sided weakness.  When I saw her this morning and noted the evidence of the stroke on MRI and contacted neurology for consultation and evaluation.  Past Medical History:  Diagnosis Date   Arthritis    CKD (chronic kidney disease), stage III ([place])    patient denies   Depression    Diabetes mellitus without complication ([place])    Difficult intravenous access    Endometrial cancer ([place])    GERD (gastroesophageal reflux disease)    Headache    History of radiation therapy 11/04/2019-12/01/2019   Endometrial HDR; Dr. Gery Pray   Hypertension    Hypothyroidism    Neuromuscular disorder ([place])    neuropathy in feet   Osteoarthritis    PMB (postmenopausal bleeding)    PONV (postoperative nausea and vomiting)    severe nausea and vomiting after knee replacement 06-2014, did ok with 2018 knee replacement   Urinary frequency    Wears glasses     Past Surgical History:  Procedure Laterality Date   BREAST SURGERY     cyst removed   CESAREAN SECTION     DILATION AND CURETTAGE OF UTERUS N/A 06/24/2019   Procedure: DILATATION AND CURETTAGE;  Surgeon: Lafonda Mosses, MD;  Location: [place];  Service: Gynecology;  Laterality: N/A;   FRACTURE SURGERY     right knee   INTRAUTERINE DEVICE (IUD) INSERTION N/A 06/24/2019   Procedure: INTRAUTERINE DEVICE (IUD) INSERTION MIRENA;  Surgeon: Lafonda Mosses, MD;  Location: [facility];  Service: Gynecology;  Laterality: N/A;   IRRIGATION AND DEBRIDEMENT SEBACEOUS CYST     JOINT REPLACEMENT Right    right   LYMPH NODE DISSECTION N/A 09/14/2019   Procedure: LYMPH NODE DISSECTION;  Surgeon: Lafonda Mosses, MD;  Location: [facility];  Service: Gynecology;  Laterality: N/A;   ROBOTIC ASSISTED TOTAL HYSTERECTOMY WITH BILATERAL SALPINGO OOPHERECTOMY Bilateral 09/14/2019   Procedure: XI ROBOTIC ASSISTED TOTAL HYSTERECTOMY WITH BILATERAL SALPINGO OOPHORECTOMY;  Surgeon: Lafonda Mosses, MD;  Location: [facility];  Service: Gynecology;  Laterality: Bilateral;   SENTINEL NODE BIOPSY N/A 09/14/2019   Procedure: SENTINEL NODE BIOPSY;  Surgeon: Lafonda Mosses, MD;  Location: [facility];  Service: Gynecology;  Laterality: N/A;   teeth extration     TONSILLECTOMY     TOTAL HIP ARTHROPLASTY Right 02/23/2015   Procedure: RIGHT TOTAL HIP ARTHROPLASTY ANTERIOR APPROACH;  Surgeon: Leandrew Koyanagi, MD;  Location: [place];  Service: Orthopedics;  Laterality: Right;   TOTAL KNEE ARTHROPLASTY Left 06/27/2016   Procedure: LEFT TOTAL KNEE ARTHROPLASTY;  Surgeon: Leandrew Koyanagi, MD;  Location: [place];  Service: Orthopedics;  Laterality: Left;    Family  History  Problem Relation Age of Onset   Pancreatic cancer Mother    Stroke Father    Hypertension Father    Colon cancer Neg Hx    Breast cancer Neg Hx    Ovarian cancer Neg Hx    Endometrial cancer Neg Hx    Prostate cancer Neg Hx     Social History:  reports that she quit smoking about 19 years ago. Her smoking use included cigarettes. She has a 67.50 pack-year smoking history. She has never used smokeless tobacco. She reports current alcohol use. She reports that she does not use  drugs.  Allergies:  Allergies  Allergen Reactions   Anesthetics, Amide Other (See Comments)    Pt is intolerant to general anesthesia. Pt will throw up and has thrown up during procedure.    Ether Nausea And Vomiting    Medications: Prior to Admission:  Medications Prior to Admission  Medication Sig Dispense Refill Last Dose   ALPRAZolam (XANAX) 0.5 MG tablet Take 0.25-0.5 mg by mouth at bedtime as needed for sleep.   Past Week   aspirin EC 81 MG tablet Take 81 mg by mouth daily. Swallow whole.   Past Week   calcium carbonate (OSCAL) 1500 (600 Ca) MG TABS tablet Take 1,500 mg by mouth daily with breakfast.   Past Week   fenofibrate 160 MG tablet Take 160 mg by mouth daily after breakfast.   2 Past Week   HUMALOG MIX 75/25 KWIKPEN (75-25) 100 UNIT/ML Kwikpen Inject 30 Units into the skin in the morning and at bedtime.  3 Past Week   metFORMIN (GLUCOPHAGE) 1000 MG tablet Take 1,000 mg by mouth 2 (two) times daily.   5 Past Week   telmisartan (MICARDIS) 80 MG tablet Take 80 mg by mouth daily after breakfast.    Past Week    Results for orders placed or performed during the hospital encounter of 10/25/21 (from the past 48 hour(s))  CBG monitoring, ED     Status: Abnormal   Collection Time: 10/25/21  3:37 PM  Result Value Ref Range   Glucose-Capillary 200 (H) 70 - 99 mg/dL    Comment: Glucose reference range applies only to samples taken after fasting for at least 8 hours.  Protime-INR     Status: None   Collection Time: 10/25/21  3:40 PM  Result Value Ref Range   Prothrombin Time 13.8 11.4 - 15.2 seconds   INR 1.1 0.8 - 1.2    Comment: (NOTE) INR goal varies based on device and disease states. Performed at [facility], [address] [place], [place] [address]   APTT     Status: None   Collection Time: 10/25/21  3:40 PM  Result Value Ref Range   aPTT 25 24 - 36 seconds    Comment: Performed at [facility], [place],  [place], [place] [address]  CBC     Status: Abnormal   Collection Time: 10/25/21  3:40 PM  Result Value Ref Range   WBC 5.7 4.0 - 10.5 K/uL   RBC 4.70 3.87 - 5.11 MIL/uL   Hemoglobin 14.1 12.0 - 15.0 g/dL   HCT 43.1 36.0 - 46.0 %   MCV 91.7 80.0 - 100.0 fL   MCH 30.0 26.0 - 34.0 pg   MCHC 32.7 30.0 - 36.0 g/dL   RDW 14.1 11.5 - 15.5 %   Platelets 146 (L) 150 - 400 K/uL   nRBC 0.0 0.0 - 0.2 %  Comment: Performed at [facility], [address] [place], [place] [address]  Differential     Status: None   Collection Time: 10/25/21  3:40 PM  Result Value Ref Range   Neutrophils Relative % 64 %   Neutro Abs 3.7 1.7 - 7.7 K/uL   Lymphocytes Relative 22 %   Lymphs Abs 1.2 0.7 - 4.0 K/uL   Monocytes Relative 10 %   Monocytes Absolute 0.6 0.1 - 1.0 K/uL   Eosinophils Relative 3 %   Eosinophils Absolute 0.1 0.0 - 0.5 K/uL   Basophils Relative 1 %   Basophils Absolute 0.0 0.0 - 0.1 K/uL   Immature Granulocytes 0 %   Abs Immature Granulocytes 0.01 0.00 - 0.07 K/uL    Comment: Performed at [facility], [address], [place], [place] [address]  Comprehensive metabolic panel     Status: Abnormal   Collection Time: 10/25/21  3:40 PM  Result Value Ref Range   Sodium 135 135 - 145 mmol/L   Potassium 3.8 3.5 - 5.1 mmol/L   Chloride 102 98 - 111 mmol/L   CO2 24 22 - 32 mmol/L   Glucose, Bld 211 (H) 70 - 99 mg/dL    Comment: Glucose reference range applies only to samples taken after fasting for at least 8 hours.   BUN 20 8 - 23 mg/dL   Creatinine, Ser 1.09 (H) 0.44 - 1.00 mg/dL   Calcium 9.6 8.9 - 10.3 mg/dL   Total Protein 6.9 6.5 - 8.1 g/dL   Albumin 4.1 3.5 - 5.0 g/dL   AST 12 (L) 15 - 41 U/L   ALT 11 0 - 44 U/L   Alkaline Phosphatase 51 38 - 126 U/L   Total Bilirubin 0.5 0.3 - 1.2 mg/dL   GFR, Estimated 51 (L) >60 mL/min    Comment: (NOTE) Calculated using the CKD-EPI Creatinine Equation (2021)    Anion gap 9 5 - 15    Comment: Performed at  [facility], [address], [place], [place] [address]  Ethanol     Status: None   Collection Time: 10/25/21  3:40 PM  Result Value Ref Range   Alcohol, Ethyl (B) <10 <10 mg/dL    Comment: (NOTE) Lowest detectable limit for serum alcohol is 10 mg/dL.  For medical purposes only. Performed at [facility], [address], [place], [place] [address]   Glucose, capillary     Status: Abnormal   Collection Time: 10/25/21 10:39 PM  Result Value Ref Range   Glucose-Capillary 239 (H) 70 - 99 mg/dL    Comment: Glucose reference range applies only to samples taken after fasting for at least 8 hours.  Hemoglobin A1c     Status: Abnormal   Collection Time: 10/26/21  2:25 AM  Result Value Ref Range   Hgb A1c MFr Bld 7.0 (H) 4.8 - 5.6 %    Comment: (NOTE) Pre diabetes:          5.7%-6.4%  Diabetes:              >6.4%  Glycemic control for   <7.0% adults with diabetes    Mean Plasma Glucose 154.2 mg/dL    Comment: Performed at [facility] [address] [place], [place] [address]  CBC with Differential/Platelet     Status: Abnormal   Collection Time: 10/26/21  2:25 AM  Result Value Ref Range   WBC 5.8 4.0 - 10.5 K/uL   RBC 4.74 3.87 - 5.11  MIL/uL   Hemoglobin 14.5 12.0 - 15.0 g/dL   HCT 42.6 36.0 - 46.0 %   MCV 89.9 80.0 - 100.0 fL   MCH 30.6 26.0 - 34.0 pg   MCHC 34.0 30.0 - 36.0 g/dL   RDW 13.9 11.5 - 15.5 %   Platelets 139 (L) 150 - 400 K/uL   nRBC 0.0 0.0 - 0.2 %   Neutrophils Relative % 89 %   Neutro Abs 5.1 1.7 - 7.7 K/uL   Lymphocytes Relative 9 %   Lymphs Abs 0.5 (L) 0.7 - 4.0 K/uL   Monocytes Relative 2 %   Monocytes Absolute 0.1 0.1 - 1.0 K/uL   Eosinophils Relative 0 %   Eosinophils Absolute 0.0 0.0 - 0.5 K/uL   Basophils Relative 0 %   Basophils Absolute 0.0 0.0 - 0.1 K/uL   Immature Granulocytes 0 %   Abs Immature Granulocytes 0.02 0.00 - 0.07 K/uL    Comment: Performed at [facility] [address]  [place], [place] [address]  Comprehensive metabolic panel     Status: Abnormal   Collection Time: 10/26/21  2:25 AM  Result Value Ref Range   Sodium 138 135 - 145 mmol/L   Potassium 3.9 3.5 - 5.1 mmol/L   Chloride 104 98 - 111 mmol/L   CO2 22 22 - 32 mmol/L   Glucose, Bld 251 (H) 70 - 99 mg/dL    Comment: Glucose reference range applies only to samples taken after fasting for at least 8 hours.   BUN 17 8 - 23 mg/dL   Creatinine, Ser 0.96 0.44 - 1.00 mg/dL   Calcium 9.2 8.9 - 10.3 mg/dL   Total Protein 6.7 6.5 - 8.1 g/dL   Albumin 3.6 3.5 - 5.0 g/dL   AST 15 15 - 41 U/L   ALT 14 0 - 44 U/L   Alkaline Phosphatase 51 38 - 126 U/L   Total Bilirubin 0.8 0.3 - 1.2 mg/dL   GFR, Estimated 59 (L) >60 mL/min    Comment: (NOTE) Calculated using the CKD-EPI Creatinine Equation (2021)    Anion gap 12 5 - 15    Comment: Performed at [facility] [address], [place], [place] [address]  Magnesium     Status: None   Collection Time: 10/26/21  2:25 AM  Result Value Ref Range   Magnesium 1.8 1.7 - 2.4 mg/dL    Comment: Performed at [facility] [address], [place], [place] [address]  Phosphorus     Status: None   Collection Time: 10/26/21  2:25 AM  Result Value Ref Range   Phosphorus 2.8 2.5 - 4.6 mg/dL    Comment: Performed at [facility] [address], [place], [place] [address]  Glucose, capillary     Status: Abnormal   Collection Time: 10/26/21  7:40 AM  Result Value Ref Range   Glucose-Capillary 247 (H) 70 - 99 mg/dL    Comment: Glucose reference range applies only to samples taken after fasting for at least 8 hours.  Glucose, capillary     Status: Abnormal   Collection Time: 10/26/21 11:15 AM  Result Value Ref Range   Glucose-Capillary 317 (H) 70 - 99 mg/dL    Comment: Glucose reference range applies only to samples taken after fasting for at least 8 hours.  Urinalysis, Complete w Microscopic Urine, Clean Catch     Status: Abnormal   Collection Time: 10/26/21   2:45 PM  Result Value  Ref Range   Color, Urine YELLOW YELLOW   APPearance CLOUDY (A) CLEAR   Specific Gravity, Urine >1.046 (H) 1.005 - 1.030   pH 5.0 5.0 - 8.0   Glucose, UA >=500 (A) NEGATIVE mg/dL   Hgb urine dipstick SMALL (A) NEGATIVE   Bilirubin Urine NEGATIVE NEGATIVE   Ketones, ur 5 (A) NEGATIVE mg/dL   Protein, ur 30 (A) NEGATIVE mg/dL   Nitrite NEGATIVE NEGATIVE   Leukocytes,Ua LARGE (A) NEGATIVE   RBC / HPF 6-10 0 - 5 RBC/hpf   WBC, UA >50 (H) 0 - 5 WBC/hpf   Bacteria, UA MANY (A) NONE SEEN   Squamous Epithelial / LPF 0-5 0 - 5   Mucus PRESENT     Comment: Performed at [facility] [address], [place], [place] [address]  Glucose, capillary     Status: Abnormal   Collection Time: 10/26/21  3:39 PM  Result Value Ref Range   Glucose-Capillary 331 (H) 70 - 99 mg/dL    Comment: Glucose reference range applies only to samples taken after fasting for at least 8 hours.  TSH     Status: None   Collection Time: 10/26/21  4:53 PM  Result Value Ref Range   TSH 0.993 0.350 - 4.500 uIU/mL    Comment: Performed by a 3rd Generation assay with a functional sensitivity of <=0.01 uIU/mL. Performed at [facility], [place] [address], [place], [place] [address]     ECHOCARDIOGRAM COMPLETE  Result Date: 10/26/2021    ECHOCARDIOGRAM REPORT   Patient Name:   BAELYN DORING Date of Exam: 10/26/2021 Medical Rec #:  [id number]    Height:       65.0 in Accession #:    [id number]   Weight:       169.0 lb Date of Birth:  1940-08-17    BSA:          1.841 m Patient Age:    1 years     BP:           168/102 mmHg Patient Gender: F            HR:           82 bpm. Exam Location:  Inpatient Procedure: 2D Echo, Cardiac Doppler and Color Doppler Indications:    Stroke I63.9  History:        Patient has no prior history of Echocardiogram examinations.                 Risk Factors:Hypertension and Diabetes. Chronic kidney disease.  Sonographer:    Darlina Sicilian RDCS Referring Phys: [phone] DANIEL V  THOMPSON  Sonographer Comments: Suboptimal parasternal window. Parasternal measurements attempted. IMPRESSIONS  1. Left ventricular ejection fraction, by estimation, is 60 to 65%. The left ventricle has normal function. The left ventricle has no regional wall motion abnormalities. There is moderate asymmetric left ventricular hypertrophy of the basal-septal segment. Left ventricular diastolic parameters are indeterminate.  2. Right ventricular systolic function is normal. The right ventricular size is normal. Tricuspid regurgitation signal is inadequate for assessing PA pressure.  3. The mitral valve is normal in structure. No evidence of mitral valve regurgitation. No evidence of mitral stenosis.  4. The aortic valve was not well visualized. Aortic valve regurgitation is not visualized. No aortic stenosis is present.  5. The inferior vena cava is normal in size with greater than 50% respiratory variability, suggesting right atrial pressure of 3 mmHg. FINDINGS  Left Ventricle: Left  ventricular ejection fraction, by estimation, is 60 to 65%. The left ventricle has normal function. The left ventricle has no regional wall motion abnormalities. The left ventricular internal cavity size was small. There is moderate  asymmetric left ventricular hypertrophy of the basal-septal segment. Left ventricular diastolic parameters are indeterminate. Right Ventricle: The right ventricular size is normal. No increase in right ventricular wall thickness. Right ventricular systolic function is normal. Tricuspid regurgitation signal is inadequate for assessing PA pressure. Left Atrium: Left atrial size was normal in size. Right Atrium: Right atrial size was normal in size. Pericardium: There is no evidence of pericardial effusion. Mitral Valve: The mitral valve is normal in structure. No evidence of mitral valve regurgitation. No evidence of mitral valve stenosis. Tricuspid Valve: The tricuspid valve is normal in structure. Tricuspid  valve regurgitation is trivial. Aortic Valve: The aortic valve was not well visualized. Aortic valve regurgitation is not visualized. No aortic stenosis is present. Pulmonic Valve: The pulmonic valve was not well visualized. Pulmonic valve regurgitation is not visualized. Aorta: The aortic root is normal in size and structure. Venous: The inferior vena cava is normal in size with greater than 50% respiratory variability, suggesting right atrial pressure of 3 mmHg. IAS/Shunts: The interatrial septum was not well visualized.  LEFT VENTRICLE PLAX 2D LVIDd:         3.55 cm   Diastology LVIDs:         2.30 cm   LV e' medial:    4.12 cm/s LV PW:         1.25 cm   LV E/e' medial:  19.8 LV IVS:        1.30 cm   LV e' lateral:   7.53 cm/s LVOT diam:     1.90 cm   LV E/e' lateral: 10.8 LV SV:         61 LV SV Index:   33 LVOT Area:     2.84 cm  RIGHT VENTRICLE RV S prime:     19.50 cm/s TAPSE (M-mode): 2.0 cm LEFT ATRIUM             Index        RIGHT ATRIUM           Index LA diam:        3.40 cm 1.85 cm/m   RA Area:     12.60 cm LA Vol (A2C):   43.7 ml 23.73 ml/m  RA Volume:   23.40 ml  12.71 ml/m LA Vol (A4C):   49.9 ml 27.10 ml/m LA Biplane Vol: 47.6 ml 25.85 ml/m  AORTIC VALVE LVOT Vmax:   88.80 cm/s LVOT Vmean:  64.300 cm/s LVOT VTI:    0.214 m  AORTA Ao Root diam: 2.90 cm MITRAL VALVE MV Area (PHT): 4.19 cm    SHUNTS MV Decel Time: 181 msec    Systemic VTI:  0.21 m MV E velocity: 81.40 cm/s  Systemic Diam: 1.90 cm MV A velocity: 72.40 cm/s MV E/A ratio:  1.12 Oswaldo Milian MD Electronically signed by Oswaldo Milian MD Signature Date/Time: 10/26/2021/5:29:43 PM    Final    CT ANGIO HEAD W OR WO CONTRAST  Result Date: 10/26/2021 CLINICAL DATA:  Stroke, follow up EXAM: CT ANGIOGRAPHY HEAD AND NECK TECHNIQUE: Multidetector CT imaging of the head and neck was performed using the standard protocol during bolus administration of intravenous contrast. Multiplanar CT image reconstructions and MIPs were  obtained to evaluate the vascular anatomy. Carotid stenosis measurements (when applicable) are  obtained utilizing NASCET criteria, using the distal internal carotid diameter as the denominator. RADIATION DOSE REDUCTION: This exam was performed according to the departmental dose-optimization program which includes automated exposure control, adjustment of the mA and/or kV according to patient size and/or use of iterative reconstruction technique. CONTRAST:  5m OMNIPAQUE IOHEXOL 350 MG/ML SOLN COMPARISON:  None Available. FINDINGS: CTA NECK Aortic arch: Calcified plaque.  Great vessel origins are patent. Right carotid system: Patent. Calcified plaque at the bifurcation and proximal internal carotid with less than 50% stenosis. Left carotid system: Patent. Noncalcified plaque at the common carotid origin with minimal stenosis. Calcified plaque at the bifurcation and proximal internal carotid with less than 50% stenosis. Vertebral arteries: Patent. Plaque at the left vertebral origin causes mild stenosis. Skeleton: Degenerative changes of the included spine. Other neck: Asymmetric prominence of the left palatine tonsil without discrete mass. Upper chest: Emphysema. Review of the MIP images confirms the above findings CTA HEAD Anterior circulation: Intracranial internal carotid arteries are patent with calcified plaque causing up to moderate stenosis. Anterior and middle cerebral arteries are patent. There is displacement of the anterior cerebral arteries by the falcine meningioma. High-grade stenosis of proximal left M2 MCA branch. High-grade stenosis of left A2 ACA. Posterior circulation: Intracranial vertebral arteries are patent with atherosclerotic irregularity. There is mild stenosis on the right and moderate stenosis on the left. Basilar artery is patent with atherosclerotic irregularity. There is up to marked stenosis distally. Left posterior communicating artery is present with fetal origin of the left  posterior cerebral artery. The posterior cerebral arteries are patent with atherosclerotic irregularity. Moderate stenosis at the right P1-P2 junction. Venous sinuses: Patent as allowed by contrast bolus timing. Review of the MIP images confirms the above findings IMPRESSION: No large vessel occlusion. Plaque at the ICA origins causes less than 50% stenosis. Multifocal intracranial atherosclerosis with stenoses. Of note, there is focal marked stenosis of the basilar proximal to the superior cerebellar artery origins. Electronically Signed   By: PMacy MisM.D.   On: 10/26/2021 10:35   CT ANGIO NECK W OR WO CONTRAST  Result Date: 10/26/2021 CLINICAL DATA:  Stroke, follow up EXAM: CT ANGIOGRAPHY HEAD AND NECK TECHNIQUE: Multidetector CT imaging of the head and neck was performed using the standard protocol during bolus administration of intravenous contrast. Multiplanar CT image reconstructions and MIPs were obtained to evaluate the vascular anatomy. Carotid stenosis measurements (when applicable) are obtained utilizing NASCET criteria, using the distal internal carotid diameter as the denominator. RADIATION DOSE REDUCTION: This exam was performed according to the departmental dose-optimization program which includes automated exposure control, adjustment of the mA and/or kV according to patient size and/or use of iterative reconstruction technique. CONTRAST:  767mOMNIPAQUE IOHEXOL 350 MG/ML SOLN COMPARISON:  None Available. FINDINGS: CTA NECK Aortic arch: Calcified plaque.  Great vessel origins are patent. Right carotid system: Patent. Calcified plaque at the bifurcation and proximal internal carotid with less than 50% stenosis. Left carotid system: Patent. Noncalcified plaque at the common carotid origin with minimal stenosis. Calcified plaque at the bifurcation and proximal internal carotid with less than 50% stenosis. Vertebral arteries: Patent. Plaque at the left vertebral origin causes mild stenosis.  Skeleton: Degenerative changes of the included spine. Other neck: Asymmetric prominence of the left palatine tonsil without discrete mass. Upper chest: Emphysema. Review of the MIP images confirms the above findings CTA HEAD Anterior circulation: Intracranial internal carotid arteries are patent with calcified plaque causing up to moderate stenosis. Anterior and middle cerebral arteries  are patent. There is displacement of the anterior cerebral arteries by the falcine meningioma. High-grade stenosis of proximal left M2 MCA branch. High-grade stenosis of left A2 ACA. Posterior circulation: Intracranial vertebral arteries are patent with atherosclerotic irregularity. There is mild stenosis on the right and moderate stenosis on the left. Basilar artery is patent with atherosclerotic irregularity. There is up to marked stenosis distally. Left posterior communicating artery is present with fetal origin of the left posterior cerebral artery. The posterior cerebral arteries are patent with atherosclerotic irregularity. Moderate stenosis at the right P1-P2 junction. Venous sinuses: Patent as allowed by contrast bolus timing. Review of the MIP images confirms the above findings IMPRESSION: No large vessel occlusion. Plaque at the ICA origins causes less than 50% stenosis. Multifocal intracranial atherosclerosis with stenoses. Of note, there is focal marked stenosis of the basilar proximal to the superior cerebellar artery origins. Electronically Signed   By: Macy Mis M.D.   On: 10/26/2021 10:35   MR Brain W and Wo Contrast  Result Date: 10/25/2021 CLINICAL DATA:  Meningioma EXAM: MRI HEAD WITHOUT AND WITH CONTRAST TECHNIQUE: Multiplanar, multiecho pulse sequences of the brain and surrounding structures were obtained without and with intravenous contrast. CONTRAST:  68m GADAVIST GADOBUTROL 1 MMOL/ML IV SOLN COMPARISON:  None Available. FINDINGS: Brain: There is a large, acute right paramedian pontine infarct.  Punctate focus of acute ischemia in the left parietal white matter. No acute or chronic hemorrhage. Anterior cranial fossa meningioma measures 3.9 x 3.4 x 4.0 cm. There is a large amount of surrounding vasogenic edema in the inferior left frontal lobe. There is rightward mass effect on the right cingulate gyrus. There is subfalcine herniation of the left cingulate gyrus. Vascular: Major flow voids are preserved. Skull and upper cervical spine: Normal calvarium and skull base. Visualized upper cervical spine and soft tissues are normal. Sinuses/Orbits:No paranasal sinus fluid levels or advanced mucosal thickening. No mastoid or middle ear effusion. Normal orbits. IMPRESSION: 1. Large, acute right paramedian pontine infarct. 2. Anterior cranial fossa meningioma with large amount of surrounding vasogenic edema in the inferior left frontal lobe. Rightward midline shift with subfalcine herniation of the left cingulate gyrus. Electronically Signed   By: KUlyses JarredM.D.   On: 10/25/2021 23:40   CT HEAD WO CONTRAST  Result Date: 10/25/2021 CLINICAL DATA:  Slurred speech and increased confusion. History of hypertension, diabetes, and endometrial cancer. EXAM: CT HEAD WITHOUT CONTRAST TECHNIQUE: Contiguous axial images were obtained from the base of the skull through the vertex without intravenous contrast. RADIATION DOSE REDUCTION: This exam was performed according to the departmental dose-optimization program which includes automated exposure control, adjustment of the mA and/or kV according to patient size and/or use of iterative reconstruction technique. COMPARISON:  None Available. FINDINGS: Brain: Sharply defined low-density encephalomalacia compatible with small remote infarct of the right inferior cerebellum, image 8 series 2. A high density 5.0 by 3.8 by 3.6 cm mass primarily centered in the left anterior cranial fossa but abuts the planum sphenoidale and anterior falx, with 2.0 cm of localized left-to-right  midline shift and with extensive vasogenic edema in the left frontal lobe and a small amount of edema tracking into the left temporal lobe. Effacement of part of the frontal horn left lateral ventricle. This mass is highly likely to be a meningioma. No overt hydrocephalus. No intracranial hemorrhage or definite findings of acute CVA. Vascular: There is atherosclerotic calcification of the cavernous carotid arteries bilaterally. Skull: Unremarkable Sinuses/Orbits: Mild chronic ethmoid sinusitis. Other: No supplemental  non-categorized findings. IMPRESSION: 1. 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 Frontal horn of the left lateral ventricle is partially effaced. 2. 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 3. Atherosclerosis. 4. Mild chronic ethmoid sinusitis. Electronically Signed   By: Van Clines M.D.   On: 10/25/2021 16:27    Review of Systems  Constitutional:  Positive for activity change.  Musculoskeletal:  Positive for gait problem.  Neurological:  Positive for weakness and numbness.  All other systems reviewed and are negative.  Blood pressure (!) 163/86, pulse 98, temperature 97.8 F (36.6 C), temperature source Oral, resp. rate 18, height '5\' 5"'$  (1.651 m), weight 76.7 kg, SpO2 95 %. Physical Exam Constitutional:      Appearance: Normal appearance. She is normal weight.  HENT:     Head: Normocephalic and atraumatic.     Right Ear: Tympanic membrane, ear canal and external ear normal.     Left Ear: Tympanic membrane, ear canal and external ear normal.     Nose: Nose normal.     Mouth/Throat:     Mouth: Mucous membranes are moist.     Pharynx: Oropharynx is clear.  Eyes:     Extraocular Movements: Extraocular movements intact.     Conjunctiva/sclera: Conjunctivae normal.      Pupils: Pupils are equal, round, and reactive to light.  Cardiovascular:     Rate and Rhythm: Normal rate and regular rhythm.     Pulses: Normal pulses.     Heart sounds: Normal heart sounds.  Pulmonary:     Effort: Pulmonary effort is normal.     Breath sounds: Normal breath sounds.  Abdominal:     General: Abdomen is flat. Bowel sounds are normal.     Palpations: Abdomen is soft.  Musculoskeletal:        General: Normal range of motion.     Cervical back: Normal range of motion and neck supple.  Skin:    General: Skin is warm and dry.     Capillary Refill: Capillary refill takes less than 2 seconds.  Neurological:     Mental Status: She is alert.     Comments: Pupils are 3 mm equal and briskly reactive extraocular movements are full and the face is symmetric to grimace tongue and uvula protrude in the midline the upper extremity strength appears good proximally however distally he appears to have a slight left-sided drift.  Lower extremity strength is also weakened 4 out of 5 compared to the right side of 5 out of 5 deep tendon reflexes are 2+ in the patellae 1+ in the Achilles Babinski is upgoing bilaterally in the lower extremities upper extremity reflexes are 1+ and symmetric triceps reflexes absent.     Assessment/Plan: Impression: Patient has a large left frontal meningioma with significant vasogenic edema and left-to-right shift however on top of this she also has evidence of a acute or subacute pontine infarct.  Discussion: In spite of the pontine infarct the patient's clinical status appears to be quite good she has had some modest weakness in the left upper and lower extremity and physical therapy is evaluating her and I believe will be best to treat her conservatively and maintain her on some steroid medicine to help with vasogenic edema and let her recover from the stroke before considering a major  surgical intervention to debulk and remove her left frontal meningioma.  I  believe the surgery for her meningioma can be planned electively and it may be best to treat her with the appropriate antiplatelet agents for a period of 8 to 12 weeks if she will tolerate this well.  I understand that she is diabetic and managing her sugars may be difficult while she remains on some low-dose Decadron.  If she does not tolerate the low-dose Decadron then her hand may be forced in regards to surgery but I suspect that the main reason for the onset of symptoms was likely stroke and not the meningioma itself.  Blanchie Dessert Doniven Vanpatten 10/26/2021, 6:50 PM

## 2021-10-26 NOTE — Progress Notes (Signed)
PROGRESS NOTE    Cassandra Dodson  CNO:[id number] DOB: 11-02-1940 DOA: 10/25/2021 PCP: Shon Baton, MD    Chief Complaint  Patient presents with   Weakness   Slurred Speech    Brief Narrative:  HPI per Dr. Camillia Herter is a 81 y.o. female with medical history significant for endometrial cancer, essential hypertension, type 2 diabetes, GERD, hypothyroidism, CKD 3B who initially presented to [facility] ED with complaints of slurred speech, left-sided weakness x2 days.  Associated with mild confusion.  No headache.  Noncontrast head CT done in the ED revealed 5.0 cm long axis intracranial mass highly likely to be a meningioma associated with 2.0 cm localized left to right midline shift with extensive vasogenic edema in the left frontal lobe.  EDP discussed the case with neurosurgery Dr. Ellene Route who recommended MRI brain with and without contrast and to add IV Decadron for the vasogenic edema.   Upon assessment at [facility], the patient is alert, follows commands, denies any headaches.   ED Course: Tmax 98.6.  BP 170/93, pulse 68, respiratory rate 29, saturation 96% on room air.  Lab studies significant for glucose 211, BUN 20, creatinine 1.09 GFR 51.  CBC essentially unremarkable except for platelet count of 146.    Assessment & Plan:   Principal Problem:   Brain mass Active Problems:   CVA (cerebral vascular accident) ([place])   Stage 3b chronic kidney disease ([place])   Thrombocytopenia ([place])   History of endometrial cancer   #1 intracranial mass, seen on CT -Patient presented with left-sided weakness and slurred speech. -Noncontrast CT done in the ED with 5.0 cm long axis left paracentral intracranial mass along the planum sphenoidale and anterior falx, highly likely to be a meningioma associated with 2.0 cm of localized left right midline shift and with extensive vasogenic edema in the left frontal lobe.  Frontal horn of the left lateral ventricle partially effaced.  Old right  inferior cerebellar infarct. -MRI brain done as recommended per neurosurgery with large acute right paramedian pontine infarct, anterior cranial fossa meningioma with large amount of surrounding vasogenic edema in the anterior left frontal lobe.  Rightward midline shift with subfalcine herniation of the left cingulate gyrus. -Neurosurgery consulted who recommended IV Decadron 4 mg every 6 hours. -GI prophylaxis of PPI added. -Per neurosurgery.  2.  Acute right paramedian pontine infarct -Noted on MRI brain. -Stroke work-up underway and stroke pathway ordered. -CT angiogram head and neck ordered with no LVO, multivessel multifocal stenosis.  Focal marked stenosis of the basilar proximal to superior cerebellar artery origins. 2D echo ordered. -Patient resumed on home regimen aspirin for secondary prophylaxis. -Permissive hypertension -Neurology consulted and feels stroke appears to be due to small vessel disease, also noted that patient seemed to have bradyphrenia and executive dysfunction which could be due to edema on the left frontal lobe. -Neurology recommending obtaining a fasting lipid panel, TSH, B12, folate, and to start statin if LDL > 70, PT/OT/SLP. -EEG recommended and ordered per neurology. -Per neurology.  3.  CKD stage IIIb -Stable.  4.  Acute thrombocytopenia -Patient with no overt bleeding. -Follow.  5.  History of endometrial cancer -Patient follows with OB/GYN oncology, Dr. Berline Lopes in the outpatient setting. -Outpatient follow-up.   DVT prophylaxis: SCDs Code Status: Full Family Communication: Updated patient.  No family at bedside. Disposition:   Status is: Inpatient The patient will require care spanning > 2 midnights and should be moved to inpatient because: Severity of illness  Consultants:  Neurosurgery Neurology: [place] 10/26/2021  Procedures:  CT head 10/25/2021 CT angiogram head and neck 10/26/2021 pending MRI brain 10/25/2021 2D echo pending  10/26/2021 EEG pending 10/26/2021    Antimicrobials:  None   Subjective: Patient laying in bed, physical therapy working with patient.  No chest pain.  No shortness of breath.  No abdominal pain.  Some bouts of confusion noted.  Patient with some dysarthric speech.  Objective: Vitals:   10/26/21 0741 10/26/21 0753 10/26/21 1100 10/26/21 1537  BP: (!) 207/98 (!) 218/87 (!) 160/92 (!) 163/86  Pulse: 71 65 (!) 108 98  Resp: 19 (!) '24 19 18  '$ Temp: 98 F (36.7 C)  98 F (36.7 C) 97.8 F (36.6 C)  TempSrc: Oral  Oral Oral  SpO2: 92% 94% 97% 95%  Weight:      Height:        Intake/Output Summary (Last 24 hours) at 10/26/2021 1651 Last data filed at 10/26/2021 1117 Gross per 24 hour  Intake 400 ml  Output 1100 ml  Net -700 ml   Filed Weights   10/25/21 1531  Weight: 76.7 kg    Examination:  General exam: Appears calm and comfortable  Respiratory system: Clear to auscultation anterior lung fields.  No wheezes, no crackles, no rhonchi.Marland Kitchen Respiratory effort normal. Cardiovascular system: S1 & S2 heard, RRR. No JVD, murmurs, rubs, gallops or clicks. No pedal edema. Gastrointestinal system: Abdomen is nondistended, soft and nontender. No organomegaly or masses felt. Normal bowel sounds heard. Central nervous system: Alert and oriented.  Some dysarthric speech.  Left upper extremity weakness no focal neurological deficits. Extremities: Symmetric 5 x 5 power. Skin: No rashes, lesions or ulcers Psychiatry: Judgement and insight appear normal. Mood & affect appropriate.     Data Reviewed: I have personally reviewed following labs and imaging studies  CBC: Recent Labs  Lab 10/25/21 1540 10/26/21 0225  WBC 5.7 5.8  NEUTROABS 3.7 5.1  HGB 14.1 14.5  HCT 43.1 42.6  MCV 91.7 89.9  PLT 146* 139*    Basic Metabolic Panel: Recent Labs  Lab 10/25/21 1540 10/26/21 0225  NA 135 138  K 3.8 3.9  CL 102 104  CO2 24 22  GLUCOSE 211* 251*  BUN 20 17  CREATININE 1.09* 0.96   CALCIUM 9.6 9.2  MG  --  1.8  PHOS  --  2.8    GFR: Estimated Creatinine Clearance: 46.3 mL/min (by C-G formula based on SCr of 0.96 mg/dL).  Liver Function Tests: Recent Labs  Lab 10/25/21 1540 10/26/21 0225  AST 12* 15  ALT 11 14  ALKPHOS 51 51  BILITOT 0.5 0.8  PROT 6.9 6.7  ALBUMIN 4.1 3.6    CBG: Recent Labs  Lab 10/25/21 1537 10/25/21 2239 10/26/21 0740 10/26/21 1115 10/26/21 1539  GLUCAP 200* 239* 247* 317* 331*     No results found for this or any previous visit (from the past 240 hour(s)).       Radiology Studies: CT ANGIO HEAD W OR WO CONTRAST  Result Date: 10/26/2021 CLINICAL DATA:  Stroke, follow up EXAM: CT ANGIOGRAPHY HEAD AND NECK TECHNIQUE: Multidetector CT imaging of the head and neck was performed using the standard protocol during bolus administration of intravenous contrast. Multiplanar CT image reconstructions and MIPs were obtained to evaluate the vascular anatomy. Carotid stenosis measurements (when applicable) are obtained utilizing NASCET criteria, using the distal internal carotid diameter as the denominator. RADIATION DOSE REDUCTION: This exam was performed according to the departmental dose-optimization  program which includes automated exposure control, adjustment of the mA and/or kV according to patient size and/or use of iterative reconstruction technique. CONTRAST:  67m OMNIPAQUE IOHEXOL 350 MG/ML SOLN COMPARISON:  None Available. FINDINGS: CTA NECK Aortic arch: Calcified plaque.  Great vessel origins are patent. Right carotid system: Patent. Calcified plaque at the bifurcation and proximal internal carotid with less than 50% stenosis. Left carotid system: Patent. Noncalcified plaque at the common carotid origin with minimal stenosis. Calcified plaque at the bifurcation and proximal internal carotid with less than 50% stenosis. Vertebral arteries: Patent. Plaque at the left vertebral origin causes mild stenosis. Skeleton: Degenerative changes  of the included spine. Other neck: Asymmetric prominence of the left palatine tonsil without discrete mass. Upper chest: Emphysema. Review of the MIP images confirms the above findings CTA HEAD Anterior circulation: Intracranial internal carotid arteries are patent with calcified plaque causing up to moderate stenosis. Anterior and middle cerebral arteries are patent. There is displacement of the anterior cerebral arteries by the falcine meningioma. High-grade stenosis of proximal left M2 MCA branch. High-grade stenosis of left A2 ACA. Posterior circulation: Intracranial vertebral arteries are patent with atherosclerotic irregularity. There is mild stenosis on the right and moderate stenosis on the left. Basilar artery is patent with atherosclerotic irregularity. There is up to marked stenosis distally. Left posterior communicating artery is present with fetal origin of the left posterior cerebral artery. The posterior cerebral arteries are patent with atherosclerotic irregularity. Moderate stenosis at the right P1-P2 junction. Venous sinuses: Patent as allowed by contrast bolus timing. Review of the MIP images confirms the above findings IMPRESSION: No large vessel occlusion. Plaque at the ICA origins causes less than 50% stenosis. Multifocal intracranial atherosclerosis with stenoses. Of note, there is focal marked stenosis of the basilar proximal to the superior cerebellar artery origins. Electronically Signed   By: PMacy MisM.D.   On: 10/26/2021 10:35   CT ANGIO NECK W OR WO CONTRAST  Result Date: 10/26/2021 CLINICAL DATA:  Stroke, follow up EXAM: CT ANGIOGRAPHY HEAD AND NECK TECHNIQUE: Multidetector CT imaging of the head and neck was performed using the standard protocol during bolus administration of intravenous contrast. Multiplanar CT image reconstructions and MIPs were obtained to evaluate the vascular anatomy. Carotid stenosis measurements (when applicable) are obtained utilizing NASCET criteria,  using the distal internal carotid diameter as the denominator. RADIATION DOSE REDUCTION: This exam was performed according to the departmental dose-optimization program which includes automated exposure control, adjustment of the mA and/or kV according to patient size and/or use of iterative reconstruction technique. CONTRAST:  722mOMNIPAQUE IOHEXOL 350 MG/ML SOLN COMPARISON:  None Available. FINDINGS: CTA NECK Aortic arch: Calcified plaque.  Great vessel origins are patent. Right carotid system: Patent. Calcified plaque at the bifurcation and proximal internal carotid with less than 50% stenosis. Left carotid system: Patent. Noncalcified plaque at the common carotid origin with minimal stenosis. Calcified plaque at the bifurcation and proximal internal carotid with less than 50% stenosis. Vertebral arteries: Patent. Plaque at the left vertebral origin causes mild stenosis. Skeleton: Degenerative changes of the included spine. Other neck: Asymmetric prominence of the left palatine tonsil without discrete mass. Upper chest: Emphysema. Review of the MIP images confirms the above findings CTA HEAD Anterior circulation: Intracranial internal carotid arteries are patent with calcified plaque causing up to moderate stenosis. Anterior and middle cerebral arteries are patent. There is displacement of the anterior cerebral arteries by the falcine meningioma. High-grade stenosis of proximal left M2 MCA branch. High-grade stenosis of  left A2 ACA. Posterior circulation: Intracranial vertebral arteries are patent with atherosclerotic irregularity. There is mild stenosis on the right and moderate stenosis on the left. Basilar artery is patent with atherosclerotic irregularity. There is up to marked stenosis distally. Left posterior communicating artery is present with fetal origin of the left posterior cerebral artery. The posterior cerebral arteries are patent with atherosclerotic irregularity. Moderate stenosis at the right  P1-P2 junction. Venous sinuses: Patent as allowed by contrast bolus timing. Review of the MIP images confirms the above findings IMPRESSION: No large vessel occlusion. Plaque at the ICA origins causes less than 50% stenosis. Multifocal intracranial atherosclerosis with stenoses. Of note, there is focal marked stenosis of the basilar proximal to the superior cerebellar artery origins. Electronically Signed   By: Macy Mis M.D.   On: 10/26/2021 10:35   MR Brain W and Wo Contrast  Result Date: 10/25/2021 CLINICAL DATA:  Meningioma EXAM: MRI HEAD WITHOUT AND WITH CONTRAST TECHNIQUE: Multiplanar, multiecho pulse sequences of the brain and surrounding structures were obtained without and with intravenous contrast. CONTRAST:  58m GADAVIST GADOBUTROL 1 MMOL/ML IV SOLN COMPARISON:  None Available. FINDINGS: Brain: There is a large, acute right paramedian pontine infarct. Punctate focus of acute ischemia in the left parietal white matter. No acute or chronic hemorrhage. Anterior cranial fossa meningioma measures 3.9 x 3.4 x 4.0 cm. There is a large amount of surrounding vasogenic edema in the inferior left frontal lobe. There is rightward mass effect on the right cingulate gyrus. There is subfalcine herniation of the left cingulate gyrus. Vascular: Major flow voids are preserved. Skull and upper cervical spine: Normal calvarium and skull base. Visualized upper cervical spine and soft tissues are normal. Sinuses/Orbits:No paranasal sinus fluid levels or advanced mucosal thickening. No mastoid or middle ear effusion. Normal orbits. IMPRESSION: 1. Large, acute right paramedian pontine infarct. 2. Anterior cranial fossa meningioma with large amount of surrounding vasogenic edema in the inferior left frontal lobe. Rightward midline shift with subfalcine herniation of the left cingulate gyrus. Electronically Signed   By: KUlyses JarredM.D.   On: 10/25/2021 23:40   CT HEAD WO CONTRAST  Result Date: 10/25/2021 CLINICAL  DATA:  Slurred speech and increased confusion. History of hypertension, diabetes, and endometrial cancer. EXAM: CT HEAD WITHOUT CONTRAST TECHNIQUE: Contiguous axial images were obtained from the base of the skull through the vertex without intravenous contrast. RADIATION DOSE REDUCTION: This exam was performed according to the departmental dose-optimization program which includes automated exposure control, adjustment of the mA and/or kV according to patient size and/or use of iterative reconstruction technique. COMPARISON:  None Available. FINDINGS: Brain: Sharply defined low-density encephalomalacia compatible with small remote infarct of the right inferior cerebellum, image 8 series 2. A high density 5.0 by 3.8 by 3.6 cm mass primarily centered in the left anterior cranial fossa but abuts the planum sphenoidale and anterior falx, with 2.0 cm of localized left-to-right midline shift and with extensive vasogenic edema in the left frontal lobe and a small amount of edema tracking into the left temporal lobe. Effacement of part of the frontal horn left lateral ventricle. This mass is highly likely to be a meningioma. No overt hydrocephalus. No intracranial hemorrhage or definite findings of acute CVA. Vascular: There is atherosclerotic calcification of the cavernous carotid arteries bilaterally. Skull: Unremarkable Sinuses/Orbits: Mild chronic ethmoid sinusitis. Other: No supplemental non-categorized findings. IMPRESSION: 1. 5.0 cm in long axis left paracentral intracranial mass along the planum sphenoidale and anterior falx, highly likely to be a  meningioma, associated with 2.0 cm of localized left right midline shift and with extensive vasogenic edema in the left frontal lobe. Frontal horn of the left lateral ventricle is partially effaced. 2. 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 3. Atherosclerosis. 4. Mild chronic ethmoid sinusitis. Electronically Signed   By: Van Clines M.D.   On: 10/25/2021 16:27         Scheduled Meds:  [START ON 10/27/2021]  stroke: early stages of recovery book   Does not apply Once   aspirin EC  81 mg Oral Daily   calcium carbonate  1,250 mg Oral Q breakfast   dexamethasone (DECADRON) injection  4 mg Intravenous Q6H   fenofibrate  160 mg Oral QPC breakfast   insulin aspart  0-5 Units Subcutaneous QHS   insulin aspart  0-9 Units Subcutaneous TID WC   insulin glargine-yfgn  20 Units Subcutaneous Daily   irbesartan  75 mg Oral Daily   pantoprazole  40 mg Oral Daily   Continuous Infusions:   LOS: 0 days    Time spent: 40 minutes    Irine Seal, MD [facility]   To contact the attending provider between 7A-7P or the covering provider during after hours 7P-7A, please log into the web site [email or web address] and access using universal [place] password for that web site. If you do not have the password, please call the hospital operator.  10/26/2021, 4:51 PM

## 2021-10-26 NOTE — TOC Progression Note (Signed)
Transition of Care [facility]) - Progression Note    Patient Details  Name: Cassandra Dodson MRN: [id number] Date of Birth: [birth year]  Transition of Care [facility] CM/SW [place], RN Phone Number:[phone]  10/26/2021, 3:48 PM  Clinical Narrative:    TOC following patient admitted with  Slurred speech, left-sided weakness x2 days. TOC acknowledges PT recommendation for CIR. TOC will follow for disposition needs.         Expected Discharge Plan and Services                                                 Social Determinants of Health (SDOH) Interventions    Readmission Risk Interventions     No data to display

## 2021-10-26 NOTE — Evaluation (Signed)
Physical Therapy Evaluation Patient Details Name: Cassandra Dodson MRN: [id number] DOB: 1941/03/05 Today's Date: 10/26/2021  History of Present Illness  Pt is an 81 y/o female admitted secondary to slurred speech and L sided weakness. 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 PMH including but not limited to HTN, DM, CKD3, GERD and endometrial cancer.   Clinical Impression  Pt presented supine in bed with HOB elevated, awake and willing to participate in therapy session. Prior to admission, pt reported that she ambulated with use of a RW and required assistance from her son for ADLs. Pt lives alone in a single level apartment with a level entry. At the time of evaluation, pt was able to complete bed mobility with supervision, transfers with min A and ambulate a very short distance with use of RW and min A for stability. Limited with distance secondary to tachycardia (up to 145 bpm with activity). RN was notified and aware. PT spoke with pt's son after evaluation via telephone conversation. He confirmed the information the pt provided re: home set-up and PLOF. He also expressed his interest in her going to rehab before returning home. Pt would greatly benefit from further intensive therapy services in the AIR setting prior to d/c home with son's support to maximize her safety and independence with functional mobility. PT will continue to follow acutely to progress mobility as tolerated.   Also of note, pt's SpO2 decreasing to as low as 78% on RA with ?waveform; however, quickly recovered with sitting rest break.        Recommendations for follow up therapy are one component of a multi-disciplinary discharge planning process, led by the attending physician.  Recommendations  may be updated based on patient status, additional functional criteria and insurance authorization.  Follow Up Recommendations Acute inpatient rehab (3hours/day)      Assistance Recommended at Discharge Frequent or constant Supervision/Assistance  Patient can return home with the following  A little help with walking and/or transfers;A little help with bathing/dressing/bathroom;Assistance with cooking/housework;Direct supervision/assist for medications management;Direct supervision/assist for financial management;Assist for transportation;Help with stairs or ramp for entrance    Equipment Recommendations None recommended by PT  Recommendations for Other Services  Rehab consult    Functional Status Assessment Patient has had a recent decline in their functional status and demonstrates the ability to make significant improvements in function in a reasonable and predictable amount of time.     Precautions / Restrictions Precautions Precautions: Fall Precaution Comments: urinary incontenance Restrictions Weight Bearing Restrictions: No      Mobility  Bed Mobility Overal bed mobility: Needs Assistance Bed Mobility: Supine to Sit, Sit to Supine     Supine to sit: Supervision, HOB elevated Sit to supine: Supervision   General bed mobility comments: increased time and effort needed, but no external physical assist required    Transfers Overall transfer level: Needs assistance Equipment used: Rolling walker (2 wheels) Transfers: Sit to/from Stand Sit to Stand: Min assist           General transfer comment: increased time and effort, use of momentum, min A for stability    Ambulation/Gait Ambulation/Gait assistance: Min assist Gait Distance (Feet): 3 Feet (3' forwards and 3' backwards) Assistive device: Rolling walker (2 wheels) Gait Pattern/deviations: Decreased stride length Gait velocity:  decreased     General Gait Details: pt first able to take 4-5 side steps  towards her L side at EOB with use of RW, no knee buckling noted. She then was able to ambulate 3' forwards and 3' backwards with use of RW and min A for stability and safety; limited secondary to tachycardia with HR increasing to 145 bpm (RN notified)  Stairs            Wheelchair Mobility    Modified Rankin (Stroke Patients Only) Modified Rankin (Stroke Patients Only) Pre-Morbid Rankin Score: Moderately severe disability Modified Rankin: Moderately severe disability     Balance Overall balance assessment: Needs assistance Sitting-balance support: Feet supported Sitting balance-Leahy Scale: Fair     Standing balance support: During functional activity, Bilateral upper extremity supported, Single extremity supported Standing balance-Leahy Scale: Poor                               Pertinent Vitals/Pain Pain Assessment Pain Assessment: No/denies pain    Home Living Family/patient expects to be discharged to:: Private residence Living Arrangements: Alone Available Help at Discharge: Family;Friend(s);Available PRN/intermittently Type of Home: Apartment Home Access: Level entry       Home Layout: One level Home Equipment: Conservation officer, nature (2 wheels);Cane - single point;Shower seat      Prior Function Prior Level of Function : Patient poor historian/Family not available             Mobility Comments: pt stating that she ambulates with use of a RW ADLs Comments: requires assistance from her son or an aide to get dressed and bathe. stated that she has an aide 2-3x/wk for "a few hours"     Hand Dominance   Dominant Hand: Right    Extremity/Trunk Assessment   Upper Extremity Assessment Upper Extremity Assessment: Defer to OT evaluation    Lower Extremity Assessment Lower Extremity Assessment: Difficult to assess due to impaired cognition;LLE deficits/detail LLE Deficits / Details: pt grossly 3/5 throughout but very difficult to accurately assess  secondary to cognition       Communication   Communication: HOH  Cognition Arousal/Alertness: Awake/alert Behavior During Therapy: Flat affect Overall Cognitive Status: History of cognitive impairments - at baseline Area of Impairment: Orientation, Attention, Memory, Following commands, Safety/judgement, Awareness, Problem solving                 Orientation Level: Disoriented to, Time Current Attention Level: Sustained Memory: Decreased short-term memory Following Commands: Follows one step commands with increased time, Follows one step commands inconsistently Safety/Judgement: Decreased awareness of deficits Awareness: Intellectual Problem Solving: Slow processing, Decreased initiation, Difficulty sequencing, Requires verbal cues, Requires tactile cues          General Comments      Exercises     Assessment/Plan    PT Assessment Patient needs continued PT services  PT Problem List Decreased strength;Decreased range of motion;Decreased activity tolerance;Decreased balance;Decreased mobility;Decreased coordination;Decreased cognition;Decreased knowledge of use of DME;Decreased safety awareness;Decreased knowledge of precautions;Cardiopulmonary status limiting activity       PT Treatment Interventions DME instruction;Gait training;Stair training;Functional mobility training;Therapeutic activities;Therapeutic exercise;Neuromuscular re-education;Balance training;Cognitive remediation;Patient/family education    PT Goals (Current goals can be found in the Care Plan section)  Acute Rehab PT Goals Patient Stated Goal: "I want some water!" - son requesting rehab PT Goal Formulation: With patient/family Time For Goal Achievement: 11/09/21 Potential to Achieve Goals: Good    Frequency Min 4X/week  Co-evaluation               AM-PAC PT "6 Clicks" Mobility  Outcome Measure Help needed turning from your back to your side while in a flat bed without using  bedrails?: None Help needed moving from lying on your back to sitting on the side of a flat bed without using bedrails?: A Little Help needed moving to and from a bed to a chair (including a wheelchair)?: A Little Help needed standing up from a chair using your arms (e.g., wheelchair or bedside chair)?: A Little Help needed to walk in hospital room?: A Little Help needed climbing 3-5 steps with a railing? : A Lot 6 Click Score: 18    End of Session Equipment Utilized During Treatment: Gait belt Activity Tolerance: Patient tolerated treatment well Patient left: in bed;with call bell/phone within reach;with bed alarm set Nurse Communication: Mobility status;Other (comment) (tachy with activity) PT Visit Diagnosis: Other abnormalities of gait and mobility (R26.89);Other symptoms and signs involving the nervous system (R29.898)    Time: 9518-8416 PT Time Calculation (min) (ACUTE ONLY): 42 min   Charges:   PT Evaluation $PT Eval Moderate Complexity: 1 Mod PT Treatments $Gait Training: 8-22 mins $Therapeutic Activity: 8-22 mins        Anastasio Champion, DPT  Acute Rehabilitation Services Office [place] 10/26/2021, 12:55 PM

## 2021-10-26 NOTE — Progress Notes (Signed)
Inpatient Rehab Admissions Coordinator Note:   Per therapy recommendations patient was screened for CIR candidacy by Michel Santee, PT. At this time, pt appears to be a potential candidate for CIR. I will place an order for rehab consult for full assessment, per our protocol.  Please note that insurance Josem Kaufmann will be required for CIR and pt may progress past needing CIR.    Shann Medal, PT, DPT [phone] 10/26/21 4:15 PM

## 2021-10-26 NOTE — Consult Note (Addendum)
NEUROLOGY CONSULTATION NOTE   Date of service: October 26, 2021 Patient Name: Cassandra Dodson MRN:  [id number] DOB:  1941/03/09 Reason for consult: "L sided weakness" Requesting Provider: Eugenie Filler, MD _ _ _   _ __   _ __ _ _  __ __   _ __   __ _  History of Present Illness  Cassandra Dodson is a 81 y.o. female with PMH significant for CKD3, DM2, HTN, GERD, endometrial cancer who presents with 2 days of slurred speech and feeling wobbly on her feet and left sided weakness.  CTH in the ED with left paracentral meningioma with extensive L frontal vasogenic edema. MRI brain demonstrates a R pontine stroke in addition to the L paracentral meningioma.  On evaluation, she is encephalopathic and unable to provide much history. Does report that she was wobbly on her feet and felt her left side was giving her trouble and needed help to stand up.  LKW: unclear LKW mRS: 4 tNKASE: not offered, outside window Thrombectomy: not offered, outside window. NIHSS components Score: Comment  1a Level of Conscious 0'[x]'$  1'[]'$  2'[]'$  3'[]'$      1b LOC Questions 0'[]'$  1'[x]'$  2'[]'$       1c LOC Commands 0'[x]'$  1'[]'$  2'[]'$       2 Best Gaze 0'[x]'$  1'[]'$  2'[]'$       3 Visual 0'[x]'$  1'[]'$  2'[]'$  3'[]'$      4 Facial Palsy 0'[]'$  1'[x]'$  2'[]'$  3'[]'$      5a Motor Arm - left 0'[x]'$  1'[]'$  2'[]'$  3'[]'$  4'[]'$  UN'[]'$    5b Motor Arm - Right 0'[x]'$  1'[]'$  2'[]'$  3'[]'$  4'[]'$  UN'[]'$    6a Motor Leg - Left 0'[x]'$  1'[]'$  2'[]'$  3'[]'$  4'[]'$  UN'[]'$    6b Motor Leg - Right 0'[x]'$  1'[]'$  2'[]'$  3'[]'$  4'[]'$  UN'[]'$    7 Limb Ataxia 0'[x]'$  1'[]'$  2'[]'$  3'[]'$  UN'[]'$     8 Sensory 0'[]'$  1'[x]'$  2'[]'$  UN'[]'$    Chronic after leg surgery  9 Best Language 0'[x]'$  1'[]'$  2'[]'$  3'[]'$      10 Dysarthria 0'[x]'$  1'[]'$  2'[]'$  UN'[]'$      11 Extinct. and Inattention 0'[x]'$  1'[]'$  2'[]'$       TOTAL: 3      ROS   Unable to get reliable ROS 2/2 encephalopathy.  Past History   Past Medical History:  Diagnosis Date   Arthritis    CKD (chronic kidney disease), stage III ([place])    patient denies   Depression    Diabetes mellitus without complication ([place])    Difficult intravenous access     Endometrial cancer ([place])    GERD (gastroesophageal reflux disease)    Headache    History of radiation therapy 11/04/2019-12/01/2019   Endometrial HDR; Dr. Gery Pray   Hypertension    Hypothyroidism    Neuromuscular disorder ([place])    neuropathy in feet   Osteoarthritis    PMB (postmenopausal bleeding)    PONV (postoperative nausea and vomiting)    severe nausea and vomiting after knee replacement 06-2014, did ok with 2018 knee replacement   Urinary frequency    Wears glasses    Past Surgical History:  Procedure Laterality Date   BREAST SURGERY     cyst removed   CESAREAN SECTION     DILATION AND CURETTAGE OF UTERUS N/A 06/24/2019   Procedure: DILATATION AND CURETTAGE;  Surgeon: Lafonda Mosses, MD;  Location: [facility];  Service: Gynecology;  Laterality: N/A;   FRACTURE SURGERY     right knee   INTRAUTERINE DEVICE (IUD) INSERTION N/A 06/24/2019   Procedure: INTRAUTERINE  DEVICE (IUD) INSERTION MIRENA;  Surgeon: Lafonda Mosses, MD;  Location: [facility];  Service: Gynecology;  Laterality: N/A;   IRRIGATION AND DEBRIDEMENT SEBACEOUS CYST     JOINT REPLACEMENT Right    right   LYMPH NODE DISSECTION N/A 09/14/2019   Procedure: LYMPH NODE DISSECTION;  Surgeon: Lafonda Mosses, MD;  Location: [facility];  Service: Gynecology;  Laterality: N/A;   ROBOTIC ASSISTED TOTAL HYSTERECTOMY WITH BILATERAL SALPINGO OOPHERECTOMY Bilateral 09/14/2019   Procedure: XI ROBOTIC ASSISTED TOTAL HYSTERECTOMY WITH BILATERAL SALPINGO OOPHORECTOMY;  Surgeon: Lafonda Mosses, MD;  Location: [facility];  Service: Gynecology;  Laterality: Bilateral;   SENTINEL NODE BIOPSY N/A 09/14/2019   Procedure: SENTINEL NODE BIOPSY;  Surgeon: Lafonda Mosses, MD;  Location: [facility];  Service: Gynecology;  Laterality: N/A;   teeth extration     TONSILLECTOMY     TOTAL HIP ARTHROPLASTY Right 02/23/2015   Procedure: RIGHT TOTAL HIP ARTHROPLASTY ANTERIOR APPROACH;  Surgeon: Leandrew Koyanagi,  MD;  Location: [facility];  Service: Orthopedics;  Laterality: Right;   TOTAL KNEE ARTHROPLASTY Left 06/27/2016   Procedure: LEFT TOTAL KNEE ARTHROPLASTY;  Surgeon: Leandrew Koyanagi, MD;  Location: [place] [facility];  Service: Orthopedics;  Laterality: Left;   Family History  Problem Relation Age of Onset   Pancreatic cancer Mother    Stroke Father    Hypertension Father    Colon cancer Neg Hx    Breast cancer Neg Hx    Ovarian cancer Neg Hx    Endometrial cancer Neg Hx    Prostate cancer Neg Hx    Social History   Socioeconomic History   Marital status: Widowed    Spouse name: Not on file   Number of children: Not on file   Years of education: Not on file   Highest education level: Not on file  Occupational History   Not on file  Tobacco Use   Smoking status: Former    Packs/day: 1.50    Years: 45.00    Total pack years: 67.50    Types: Cigarettes    Quit date: 04/22/2002    Years since quitting: 19.5   Smokeless tobacco: Never  Vaping Use   Vaping Use: Never used  Substance and Sexual Activity   Alcohol use: Yes    Comment: rarely   Drug use: No   Sexual activity: Not Currently  Other Topics Concern   Not on file  Social History Narrative   Not on file   Social Determinants of Health   Financial Resource Strain: Not on file  Food Insecurity: Not on file  Transportation Needs: Not on file  Physical Activity: Not on file  Stress: Not on file  Social Connections: Not on file   Allergies  Allergen Reactions   Anesthetics, Amide Other (See Comments)    Pt is intolerant to general anesthesia. Pt will throw up and has thrown up during procedure.    Ether Nausea And Vomiting    Medications   Medications Prior to Admission  Medication Sig Dispense Refill Last Dose   ALPRAZolam (XANAX) 0.5 MG tablet Take 0.25-0.5 mg by mouth at bedtime as needed for sleep.   Past Week   aspirin EC 81 MG tablet Take 81 mg by mouth daily. Swallow whole.   Past Week   calcium carbonate (OSCAL) 1500  (600 Ca) MG TABS tablet Take 1,500 mg by mouth daily with breakfast.   Past Week   fenofibrate 160 MG tablet Take 160 mg  by mouth daily after breakfast.   2 Past Week   HUMALOG MIX 75/25 KWIKPEN (75-25) 100 UNIT/ML Kwikpen Inject 30 Units into the skin in the morning and at bedtime.  3 Past Week   metFORMIN (GLUCOPHAGE) 1000 MG tablet Take 1,000 mg by mouth 2 (two) times daily.   5 Past Week   telmisartan (MICARDIS) 80 MG tablet Take 80 mg by mouth daily after breakfast.    Past Week     Vitals   Vitals:   10/25/21 2354 10/26/21 0316 10/26/21 0741 10/26/21 0753  BP: (!) 199/98 (!) 193/87 (!) 207/98 (!) 218/87  Pulse: 77 63 71 65  Resp: '19 18 19 '$ (!) 24  Temp: 98.4 F (36.9 C) 98.1 F (36.7 C) 98 F (36.7 C)   TempSrc: Oral Oral Oral   SpO2: 95% 93% 92% 94%  Weight:      Height:         Body mass index is 28.12 kg/m.  Physical Exam   General: Laying comfortably in bed; in no acute distress.  HENT: Normal oropharynx and mucosa. Normal external appearance of ears and nose.  Neck: Supple, no pain or tenderness  CV: No JVD. No peripheral edema.  Pulmonary: Symmetric Chest rise. Normal respiratory effort.  Abdomen: Soft to touch, non-tender.  Ext: No cyanosis, edema, or deformity  Skin: No rash. Normal palpation of skin.   Musculoskeletal: Normal digits and nails by inspection. No clubbing.   Neurologic Examination  Mental status/Cognition: Alert, oriented to self, place, but not to month and year, She has no clue why she is in the hospital Speech/language: dysarthric speech but understandable, fluent, hard of hearing, comprehension intact, object naming intact, repetition intact. Cranial nerves:   CN II Pupils equal and reactive to light, no VF deficits    CN III,IV,VI EOM intact, no gaze preference or deviation, no nystagmus    CN V normal sensation in V1, V2, and V3 segments bilaterally    CN VII Mild L facial droop   CN VIII Hard of hearing.   CN IX & X normal palatal  elevation, no uvular deviation   CN XI 5/5 head turn and 5/5 shoulder shrug bilaterally   CN XII midline tongue protrusion   Motor:  Muscle bulk: poor, tone normal, pronator drift mild UE drift. tremor none Mvmt Root Nerve  Muscle Right Left Comments  SA C5/6 Ax Deltoid     EF C5/6 Mc Biceps 5 5   EE C6/7/8 Rad Triceps 5 5   WF C6/7 Med FCR     WE C7/8 PIN ECU     F Ab C8/T1 U ADM/FDI 5 4+   HF L1/2/3 Fem Illopsoas 5 4   KE L2/3/4 Fem Quad     DF L4/5 D Peron Tib Ant 5 5   PF S1/2 Tibial Grc/Sol 5 5    Sensation:  Light touch Reports decreased in L Leg after she had a surgery from a fall last year.   Pin prick    Temperature    Vibration   Proprioception    Coordination/Complex Motor:  - Finger to Nose intact BL - unable to do - Rapid alternating movement are slowed - Gait: Deferred for patient safety given encephalopathy.  Labs   CBC:  Recent Labs  Lab 10/25/21 1540 10/26/21 0225  WBC 5.7 5.8  NEUTROABS 3.7 5.1  HGB 14.1 14.5  HCT 43.1 42.6  MCV 91.7 89.9  PLT 146* 139*    Basic Metabolic Panel:  Lab Results  Component Value Date   NA 138 10/26/2021   K 3.9 10/26/2021   CO2 22 10/26/2021   GLUCOSE 251 (H) 10/26/2021   BUN 17 10/26/2021   CREATININE 0.96 10/26/2021   CALCIUM 9.2 10/26/2021   GFRNONAA 59 (L) 10/26/2021   GFRAA 44 (L) 09/03/2019   Lipid Panel: No results found for: "LDLCALC" HgbA1c:  Lab Results  Component Value Date   HGBA1C 7.0 (H) 10/26/2021   Urine Drug Screen: No results found for: "LABOPIA", "COCAINSCRNUR", "LABBENZ", "AMPHETMU", "THCU", "LABBARB"  Alcohol Level     Component Value Date/Time   ETH <10 10/25/2021 1540    CT Head without contrast(Personally reviewed): L paracentral meningioma with L frontal lobe edema  CT angio Head and Neck with contrast(Personally reviewed): No LVO, multivessel mulfifocal stenosis.  MRI Brain(Personally reviewed): L paracentral menigioma with a R paracentral pontine  stroke   Impression   Cassandra Dodson is a 81 y.o. female with PMH significant for CKD3, DM2, HTN, GERD, endometrial cancer who presents with 2 days of slurred speech and feeling wobbly on her feet and left sided weakness. Suspect that the weakness is largely from the stroke.  The stroke appears to be due to small vessel disease.  She also seems to have bradyphrenia and executive dysfunction which could be due to the noted edema on the left frontal lobe.  Primary Diagnosis:  Other cerebral infarction due to occlusion of stenosis of small artery.  Secondary Diagnosis: Essential (primary) hypertension and Type 2 diabetes mellitus w/o complications  Recommendations   - Frequent Neuro checks per stroke unit protocol - Recommend obtaining TTE - Recommend obtaining Lipid panel with LDL - Please start statin if LDL > 70 - Recommend HbA1c - Antithrombotic - Aspirin '81mg'$  daily for now. Unclear if NSGY planning any intervention for the meningioma, will hold off on plavix at this time. - Recommend DVT ppx - SBP goal - aim for gradual normotension. - Recommend Telemetry monitoring for arrythmia - Recommend bedside swallow screen prior to PO intake. - Stroke education booklet - Recommend PT/OT/SLP consult - Recommend Urine Tox screen. - TSH, B12, Folate. - rEEG.  ______________________________________________________________________   Thank you for the opportunity to take part in the care of this patient. If you have any further questions, please contact the neurology consultation attending.  Signed,  [place] Pager Number [phone] _ _ _   _ __   _ __ _ _  __ __   _ __   __ _

## 2021-10-26 NOTE — Procedures (Incomplete)
Patient Name: Cassandra Dodson  MRN: [id number]  Epilepsy Attending: Lora Havens  Referring Physician/Provider: Donnetta Simpers, MD  Date: 10/26/2021 Duration:   Patient history:  81 y.o. female with who presents with 2 days of slurred speech and feeling wobbly on her feet and left sided weakness. EEG to evaluate for seizure.   Level of alertness: Awake, drowsy, sleep, comatose, lethargic ***  AEDs during EEG study: None  Technical aspects: This EEG study was done with scalp electrodes positioned according to the 10-20 International system of electrode placement. Electrical activity was acquired at a sampling rate of '500Hz'$  and reviewed with a high frequency filter of '70Hz'$  and a low frequency filter of '1Hz'$ . EEG data were recorded continuously and digitally stored.   Description: The posterior dominant rhythm consists of 9-10 Hz activity of moderate voltage (25-35 uV) seen predominantly in posterior head regions, symmetric and reactive to eye opening and eye closing. Drowsiness was characterized by attenuation of the posterior background rhythm. Sleep was characterized by vertex waves, sleep spindles (12 to 14 Hz), maximal frontocentral region.  There is an excessive amount of 15 to 18 Hz, 2-3 uV beta activity with irregular morphology distributed symmetrically and diffusely.   EEG showed continuous/intermittent generalized polymorphic sharply contoured 3 to 6 Hz theta-delta slowing.  EEG showed generalized periodic discharges with triphasic morphology at  Hz, more prominent when awake/stimulated.  Generalized Spike/Polyspikes/Sharp waves were noted in left/right frontal/temporal/parietal/occipital region.   Seizure was noted arising from left/right frontal/temporal/parietal/occipital region.  During seizure, patient was noted to.  Onset of seizure, total duration  Event button was pressed on at for .  Concomitant EEG before, during and after the event showed normal posterior dominant rhythm,  did not show any EEG changes suggest seizure.  Hyperventilation did not show any EEG change.  Physiologic photic driving was not seen during photic stimulation.  Hyperventilation and photic stimulation were not performed.     ABNORMALITY -Sharp wave, generalized, left/right frontal/temporal/parietal/occipital region.  -Spike,generalized, left/right frontal/temporal/parietal/occipital region.  -Polyspikes, generalized, left/right frontal/temporal/parietal/occipital region.  - Lateralized periodic discharges with overriding fast activity ( LPD +) left/right, maximal frontal/temporal/parietal/occipital region - Periodic discharges with triphasic morphology, generalized ( GPDs) - Intermittent slow, generalized - Continuous slow, generalized - Excessive beta, generalized    IMPRESSION: This study is within normal limits.  This study is suggestive of mild/moderate/severe diffuse encephalopathy, nonspecific etiology but likely related to sedation, toxic-metabolic etiology, anoxic/hypoxic brain injury This study is suggestive of cortical dysfunction arising from left/right frontal/temporal/parietal/occipital region, nonspecific etiology, likely secondary to underlying structural abnormality This study showed evidence of potential epileptogenicity arising from This study showed seizures arising from No seizures or epileptiform discharges were seen throughout the recording. However, only wakefulness and drowsiness were recorded. If suspicion for interictal activity remains a concern, a prolonged study including sleep should be considered.  The excessive beta activity seen in the background is most likely due to the effect of benzodiazepine and is a benign EEG pattern.   [name] [name]

## 2021-10-26 NOTE — Evaluation (Addendum)
Occupational Therapy Evaluation Patient Details Name: Cassandra Dodson MRN: [id number] DOB: [birth year] Today's Date: 10/26/2021   History of Present Illness Pt is an 81 y/o female admitted secondary to slurred speech and L sided weakness. 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 PMH including but not limited to HTN, DM, CKD3, GERD and endometrial cancer.   Clinical Impression   PTA pt lived alone and was modified independent at RW level. Son assisted with IADL tasks and Aide assisted 2-3 days/wk. On entry to room, pt restless and had pulled off all wires and leads and was "ready to leave". Pt with mild L sided weakness, inattention, impaired cognition and communication in addition to deficits listed below. Pt will benefit from rehab at [facility] but will most likely need 24/7 S after DC. Acute OT to follow.      Recommendations for follow up therapy are one component of a multi-disciplinary discharge planning process, led by the attending physician.  Recommendations may be updated based on patient status, additional functional criteria and insurance authorization.   Follow Up Recommendations  Acute inpatient rehab (3hours/day)    Assistance Recommended at Discharge Frequent or constant Supervision/Assistance  Patient can return home with the following A little help with walking and/or transfers;A lot of help with bathing/dressing/bathroom;Assistance with cooking/housework;Direct supervision/assist for medications management;Direct supervision/assist for financial management;Assist for transportation;Help with stairs or ramp for entrance    Functional Status Assessment  Patient has had a recent decline in their functional status and demonstrates the ability to make  significant improvements in function in a reasonable and predictable amount of time.  Equipment Recommendations  BSC/3in1    Recommendations for Other Services Rehab consult     Precautions / Restrictions Precautions Precautions: Fall Restrictions Weight Bearing Restrictions: No      Mobility Bed Mobility Overal bed mobility: Needs Assistance Bed Mobility: Supine to Sit, Sit to Supine     Supine to sit: Supervision, HOB elevated Sit to supine: Supervision        Transfers Overall transfer level: Needs assistance Equipment used: Rolling walker (2 wheels) Transfers: Sit to/from Stand Sit to Stand: Min assist                  Balance Overall balance assessment: Needs assistance Sitting-balance support: Feet supported Sitting balance-Leahy Scale: Fair     Standing balance support: During functional activity, Bilateral upper extremity supported, Single extremity supported Standing balance-Leahy Scale: Poor                             ADL either performed or assessed with clinical judgement   ADL Overall ADL's : Needs assistance/impaired Eating/Feeding: Set up   Grooming: Supervision/safety;Set up;Standing   Upper Body Bathing: Supervision/ safety;Set up;Sitting   Lower Body Bathing: Moderate assistance;Sit to/from stand   Upper Body Dressing : Minimal assistance   Lower Body Dressing: Moderate assistance;Sit to/from stand   Toilet Transfer: Minimal assistance;Ambulation;Rolling walker (2 wheels) (poor use of RW-  unaware L hand falling off RW at times; pushing RW about 2 ft in front of her)   Toileting- Clothing Manipulation and Hygiene: Maximal assistance (urinary incontinence)       Functional mobility during ADLs: Minimal assistance;Cueing for safety;Cueing for sequencing;Rolling walker (  2 wheels)       Vision Baseline Vision/History: 1 Wears glasses Additional Comments: will further assess; poor use of space; not seeing certain  items around her     Perception Perception Comments: impaired; poor use of space; will further assess   Praxis  Organization difficulties    Pertinent Vitals/Pain Pain Assessment Pain Assessment: No/denies pain     Hand Dominance Right   Extremity/Trunk Assessment Upper Extremity Assessment Upper Extremity Assessment: LUE deficits/detail LUE Deficits / Details: Using LUE for functional tasks, however note dropping objects she distracted;clumsy use of hand; unaware L hand not holding RW at times LUE Sensation: decreased light touch;decreased proprioception LUE Coordination: decreased fine motor;decreased gross motor   Lower Extremity Assessment Lower Extremity Assessment: Defer to PT evaluation LLE Deficits / Details: difficult to accurately assess secondary to cognition   Cervical / Trunk Assessment Cervical / Trunk Assessment: Kyphotic;Other exceptions (increased body habitus)   Communication Communication Communication: HOH Expressive deficits; word salad at times   Cognition Arousal/Alertness: Awake/alert Behavior During Therapy: Flat affect, Restless, Agitated Overall Cognitive Status: Difficult to assess Area of Impairment: Orientation, Attention, Memory, Following commands, Safety/judgement, Awareness, Problem solving                 Orientation Level: Disoriented to, Time, Situation Current Attention Level: Sustained Memory: Decreased short-term memory Following Commands: Follows one step commands with increased time Safety/Judgement: Decreased awareness of deficits, Decreased awareness of safety Awareness: Intellectual Problem Solving: Slow processing, Decreased initiation, Difficulty sequencing, Requires verbal cues, Requires tactile cues General Comments: easily frustrated; Had pulled off all wires/leads on entry to room, stating she was ready to leave     General Comments  inappropriate at times during session    Exercises     Shoulder  Instructions      Home Living Family/patient expects to be discharged to:: Private residence Living Arrangements: Alone Available Help at Discharge: Family;Friend(s);Available PRN/intermittently Type of Home: Apartment Home Access: Level entry     Home Layout: One level     Bathroom Shower/Tub: Teacher, early years/pre: Standard Bathroom Accessibility: Yes How Accessible: Accessible via walker Home Equipment: Rolling Walker (2 wheels);Cane - single point;Shower seat          Prior Functioning/Environment Prior Level of Function : Patient poor historian/Family not available             Mobility Comments: pt stating that she ambulates with use of a RW ADLs Comments: requires assistance from her son or an aide to get dressed and bathe. stated that she has an aide 2-3x/wk for "a few hours"        OT Problem List: Decreased strength;Decreased range of motion;Decreased activity tolerance;Impaired balance (sitting and/or standing);Impaired vision/perception;Decreased coordination;Decreased cognition;Decreased safety awareness;Decreased knowledge of use of DME or AE;Decreased knowledge of precautions;Impaired sensation;Obesity;Impaired UE functional use      OT Treatment/Interventions: Self-care/ADL training;Therapeutic exercise;Neuromuscular education;DME and/or AE instruction;Therapeutic activities;Cognitive remediation/compensation;Visual/perceptual remediation/compensation;Patient/family education;Balance training    OT Goals(Current goals can be found in the care plan section) Acute Rehab OT Goals Patient Stated Goal: to get out of the hospital OT Goal Formulation: Patient unable to participate in goal setting Time For Goal Achievement: 11/09/21 Potential to Achieve Goals: Good  OT Frequency: Min 2X/week    Co-evaluation              AM-PAC OT "6 Clicks" Daily Activity     Outcome Measure Help from another person eating meals?: A Little Help from  another  person taking care of personal grooming?: A Little Help from another person toileting, which includes using toliet, bedpan, or urinal?: A Lot Help from another person bathing (including washing, rinsing, drying)?: A Lot Help from another person to put on and taking off regular upper body clothing?: A Little Help from another person to put on and taking off regular lower body clothing?: A Lot 6 Click Score: 15   End of Session Equipment Utilized During Treatment: Gait belt;Rolling walker (2 wheels) Nurse Communication: Mobility status;Other (comment)  Activity Tolerance: Patient tolerated treatment well Patient left: in chair;with call bell/phone within reach;with chair alarm set  OT Visit Diagnosis: Unsteadiness on feet (R26.81);Other abnormalities of gait and mobility (R26.89);Muscle weakness (generalized) (M62.81);Other symptoms and signs involving cognitive function;Other symptoms and signs involving the nervous system (R29.898);Hemiplegia and hemiparesis Hemiplegia - Right/Left: Left Hemiplegia - dominant/non-dominant: Non-Dominant Hemiplegia - caused by: Cerebral infarction                Time: 8757-9728 OT Time Calculation (min): 35 min Charges:  OT General Charges $OT Visit: 1 Visit OT Evaluation $OT Eval Moderate Complexity: 1 Mod OT Treatments $Self Care/Home Management : 8-22 mins  Maurie Boettcher, OT/L   Acute OT Clinical Specialist [place] Pager [phone] Office [phone]   [facility] 10/26/2021, 4:22 PM

## 2021-10-26 NOTE — Progress Notes (Signed)
Tech attempted to perform EEG. Patient would not allow tech to perform EEG. She said we could do it next week and that she wasn't doing anymore test today.

## 2021-10-26 NOTE — Progress Notes (Signed)
  Echocardiogram 2D Echocardiogram has been performed.  Darlina Sicilian M 10/26/2021, 1:48 PM

## 2021-10-26 NOTE — Consult Note (Addendum)
Stroke Neurology Consultation Note  Consult Requested by: Dr. Grandville Silos  Reason for Consult: left sided weakness and slurred speech, pontine stroke found on MRI  Consult Date:  10/26/21  The history was obtained from the patient, son and chart.  During history and examination, all items were able to obtain unless otherwise noted.  History of Present Illness:  Cassandra Dodson is an 81 y.o. Caucasian female with PMH of HTN, DM, CKD3, GERD and endometrial cancer who presents with slurred speech for two days and left leg weakness for one day.  Patient's son states that she first seemed to be having problems on 7/4 when she had some difficulty ambulation, then developed slurred speech and dragging of the left leg while walking.  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 Patient denies any prior known history of meningioma or brain problems.  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  She is quite independent and son provides only little help Date last known well: Date: 10/24/2021 Time last known well: Unable to determine tPA Given: No: outside of window MRS:  4 NIHSS:  3  Past Medical History:  Diagnosis Date   Arthritis    CKD (chronic kidney disease), stage III ([place])    patient denies   Depression    Diabetes mellitus without complication ([place])    Difficult intravenous access    Endometrial cancer ([place])    GERD (gastroesophageal reflux disease)    Headache    History of radiation therapy 11/04/2019-12/01/2019   Endometrial HDR; Dr. Gery Pray   Hypertension    Hypothyroidism    Neuromuscular disorder ([place])    neuropathy in feet   Osteoarthritis    PMB (postmenopausal bleeding)    PONV (postoperative nausea and vomiting)    severe nausea and vomiting after knee replacement 06-2014, did ok with 2018 knee replacement   Urinary frequency    Wears glasses      Past Surgical History:   Procedure Laterality Date   BREAST SURGERY     cyst removed   CESAREAN SECTION     DILATION AND CURETTAGE OF UTERUS N/A 06/24/2019   Procedure: DILATATION AND CURETTAGE;  Surgeon: Lafonda Mosses, MD;  Location: [place];  Service: Gynecology;  Laterality: N/A;   FRACTURE SURGERY     right knee   INTRAUTERINE DEVICE (IUD) INSERTION N/A 06/24/2019   Procedure: INTRAUTERINE DEVICE (IUD) INSERTION MIRENA;  Surgeon: Lafonda Mosses, MD;  Location: [facility];  Service: Gynecology;  Laterality: N/A;   IRRIGATION AND DEBRIDEMENT SEBACEOUS CYST     JOINT REPLACEMENT Right    right   LYMPH NODE DISSECTION N/A 09/14/2019   Procedure: LYMPH NODE DISSECTION;  Surgeon: Lafonda Mosses, MD;  Location: [facility];  Service: Gynecology;  Laterality: N/A;   ROBOTIC ASSISTED TOTAL HYSTERECTOMY WITH BILATERAL SALPINGO OOPHERECTOMY Bilateral 09/14/2019   Procedure: XI ROBOTIC ASSISTED TOTAL HYSTERECTOMY WITH BILATERAL SALPINGO OOPHORECTOMY;  Surgeon: Lafonda Mosses, MD;  Location: [facility];  Service: Gynecology;  Laterality: Bilateral;   SENTINEL NODE BIOPSY N/A 09/14/2019   Procedure: SENTINEL NODE BIOPSY;  Surgeon: Lafonda Mosses, MD;  Location: [facility];  Service: Gynecology;  Laterality: N/A;   teeth extration     TONSILLECTOMY     TOTAL HIP ARTHROPLASTY Right 02/23/2015   Procedure: RIGHT TOTAL HIP ARTHROPLASTY ANTERIOR APPROACH;  Surgeon: Leandrew Koyanagi, MD;  Location:  [place] [facility];  Service: Orthopedics;  Laterality: Right;   TOTAL KNEE ARTHROPLASTY Left 06/27/2016   Procedure: LEFT TOTAL KNEE ARTHROPLASTY;  Surgeon: Leandrew Koyanagi, MD;  Location: [place];  Service: Orthopedics;  Laterality: Left;    Family History  Problem Relation Age of Onset   Pancreatic cancer Mother    Stroke Father    Hypertension Father    Colon cancer Neg Hx    Breast cancer Neg Hx    Ovarian cancer Neg Hx    Endometrial cancer Neg Hx    Prostate cancer Neg Hx      Social History:   reports that she quit smoking about 19 years ago. Her smoking use included cigarettes. She has a 67.50 pack-year smoking history. She has never used smokeless tobacco. She reports current alcohol use. She reports that she does not use drugs.  Review of Systems: A full ROS was attempted today and was  able to be performed.  Systems assessed include - Constitutional, Eyes, HENT, Respiratory, Cardiovascular, Gastrointestinal, Genitourinary, Integument/breast, Hematologic/lymphatic, Musculoskeletal, Neurological, Behavioral/Psych, Endocrine, Allergic/Immunologic - with pertinent responses as per HPI.  Allergies:  Allergies  Allergen Reactions   Anesthetics, Amide Other (See Comments)    Pt is intolerant to general anesthesia. Pt will throw up and has thrown up during procedure.    Ether Nausea And Vomiting     Medications: I have reviewed the patient's current medications. Prior to Admission:  Medications Prior to Admission  Medication Sig Dispense Refill Last Dose   ALPRAZolam (XANAX) 0.5 MG tablet Take 0.25-0.5 mg by mouth at bedtime as needed for sleep.   Past Week   aspirin EC 81 MG tablet Take 81 mg by mouth daily. Swallow whole.   Past Week   calcium carbonate (OSCAL) 1500 (600 Ca) MG TABS tablet Take 1,500 mg by mouth daily with breakfast.   Past Week   fenofibrate 160 MG tablet Take 160 mg by mouth daily after breakfast.   2 Past Week   HUMALOG MIX 75/25 KWIKPEN (75-25) 100 UNIT/ML Kwikpen Inject 30 Units into the skin in the morning and at bedtime.  3 Past Week   metFORMIN (GLUCOPHAGE) 1000 MG tablet Take 1,000 mg by mouth 2 (two) times daily.   5 Past Week   telmisartan (MICARDIS) 80 MG tablet Take 80 mg by mouth daily after breakfast.    Past Week   Scheduled:  [START ON 10/27/2021]  stroke: early stages of recovery book   Does not apply Once   aspirin EC  81 mg Oral Daily   calcium carbonate  1,250 mg Oral Q breakfast   dexamethasone (DECADRON) injection  4 mg Intravenous Q6H    fenofibrate  160 mg Oral QPC breakfast   insulin aspart  0-5 Units Subcutaneous QHS   insulin aspart  0-9 Units Subcutaneous TID WC   insulin glargine-yfgn  20 Units Subcutaneous Daily   irbesartan  75 mg Oral Daily   pantoprazole  40 mg Oral Daily   Continuous: IOE:[id number], hydrALAZINE  Test Results: CBC:  Recent Labs  Lab 10/25/21 1540 10/26/21 0225  WBC 5.7 5.8  NEUTROABS 3.7 5.1  HGB 14.1 14.5  HCT 43.1 42.6  MCV 91.7 89.9  PLT 146* 829*   Basic Metabolic Panel:  Recent Labs  Lab 10/25/21 1540 10/26/21 0225  NA 135 138  K 3.8 3.9  CL 102 104  CO2 24 22  GLUCOSE 211* 251*  BUN 20 17  CREATININE 1.09* 0.96  CALCIUM 9.6 9.2  MG  --  1.8  PHOS  --  2.8   Liver Function Tests: Recent Labs  Lab 10/25/21 1540 10/26/21 0225  AST 12* 15  ALT 11 14  ALKPHOS 51 51  BILITOT 0.5 0.8  PROT 6.9 6.7  ALBUMIN 4.1 3.6   No results for input(s): "LIPASE", "AMYLASE" in the last 168 hours. No results for input(s): "AMMONIA" in the last 168 hours. Coagulation Studies:  Recent Labs    10/25/21 1540  LABPROT 13.8  INR 1.1   Cardiac Enzymes: No results for input(s): "CKTOTAL", "CKMB", "CKMBINDEX", "TROPONINI" in the last 168 hours. BNP: Invalid input(s): "POCBNP" CBG:  Recent Labs  Lab 10/25/21 1537 10/25/21 2239 10/26/21 0740 10/26/21 1115  GLUCAP 200* 239* 247* 317*   Urinalysis: No results for input(s): "COLORURINE", "LABSPEC", "PHURINE", "GLUCOSEU", "HGBUR", "BILIRUBINUR", "KETONESUR", "PROTEINUR", "UROBILINOGEN", "NITRITE", "LEUKOCYTESUR" in the last 168 hours.  Invalid input(s): "APPERANCEUR" Microbiology:  Results for orders placed or performed during the hospital encounter of 06/15/19  Urine Culture     Status: Abnormal   Collection Time: 06/11/19  3:08 PM   Specimen: Urine, Random  Result Value Ref Range Status   Specimen Description   Final    URINE, RANDOM Performed at [facility] [address] [place], [place]  [address]    Special Requests   Final    NONE Performed at [facility], [place] [address], [place], [place] [address]    Culture (A)  Final    >=100,000 COLONIES/mL KLEBSIELLA PNEUMONIAE Confirmed Extended Spectrum Beta-Lactamase Producer (ESBL).  In bloodstream infections from ESBL organisms, carbapenems are preferred over piperacillin/tazobactam. They are shown to have a lower risk of mortality.    Report Status 06/13/2019 FINAL  Final   Organism ID, Bacteria KLEBSIELLA PNEUMONIAE (A)  Final      Susceptibility   Klebsiella pneumoniae - MIC*    AMPICILLIN >=32 RESISTANT Resistant     CEFAZOLIN >=64 RESISTANT Resistant     CEFTRIAXONE >=64 RESISTANT Resistant     CIPROFLOXACIN 2 INTERMEDIATE Intermediate     GENTAMICIN >=16 RESISTANT Resistant     IMIPENEM <=0.25 SENSITIVE Sensitive     NITROFURANTOIN 128 RESISTANT Resistant     TRIMETH/SULFA >=320 RESISTANT Resistant     AMPICILLIN/SULBACTAM >=32 RESISTANT Resistant     PIP/TAZO 16 SENSITIVE Sensitive     * >=100,000 COLONIES/mL KLEBSIELLA PNEUMONIAE   Lipid Panel: No results found for: "CHOL", "TRIG", "HDL", "CHOLHDL", "VLDL", "LDLCALC" HgbA1c:  Lab Results  Component Value Date   HGBA1C 7.0 (H) 10/26/2021   Urine Drug Screen: No results found for: "LABOPIA", "COCAINSCRNUR", "LABBENZ", "AMPHETMU", "THCU", "LABBARB"  Alcohol Level:  Recent Labs  Lab 10/25/21 1540  ETH <10    CT ANGIO HEAD W OR WO CONTRAST  Result Date: 10/26/2021 CLINICAL DATA:  Stroke, follow up EXAM: CT ANGIOGRAPHY HEAD AND NECK TECHNIQUE: Multidetector CT imaging of the head and neck was performed using the standard protocol during bolus administration of intravenous contrast. Multiplanar CT image reconstructions and MIPs were obtained to evaluate the vascular anatomy. Carotid stenosis measurements (when applicable) are obtained utilizing NASCET criteria, using the distal internal carotid diameter as the denominator. RADIATION DOSE  REDUCTION: This exam was performed according to the departmental dose-optimization program which includes automated exposure control, adjustment of the mA and/or kV according to patient size and/or use of iterative reconstruction technique. CONTRAST:  53m OMNIPAQUE IOHEXOL 350 MG/ML SOLN COMPARISON:  None Available. FINDINGS: CTA NECK Aortic arch: Calcified plaque.  [employer]  vessel origins are patent. Right carotid system: Patent. Calcified plaque at the bifurcation and proximal internal carotid with less than 50% stenosis. Left carotid system: Patent. Noncalcified plaque at the common carotid origin with minimal stenosis. Calcified plaque at the bifurcation and proximal internal carotid with less than 50% stenosis. Vertebral arteries: Patent. Plaque at the left vertebral origin causes mild stenosis. Skeleton: Degenerative changes of the included spine. Other neck: Asymmetric prominence of the left palatine tonsil without discrete mass. Upper chest: Emphysema. Review of the MIP images confirms the above findings CTA HEAD Anterior circulation: Intracranial internal carotid arteries are patent with calcified plaque causing up to moderate stenosis. Anterior and middle cerebral arteries are patent. There is displacement of the anterior cerebral arteries by the falcine meningioma. High-grade stenosis of proximal left M2 MCA branch. High-grade stenosis of left A2 ACA. Posterior circulation: Intracranial vertebral arteries are patent with atherosclerotic irregularity. There is mild stenosis on the right and moderate stenosis on the left. Basilar artery is patent with atherosclerotic irregularity. There is up to marked stenosis distally. Left posterior communicating artery is present with fetal origin of the left posterior cerebral artery. The posterior cerebral arteries are patent with atherosclerotic irregularity. Moderate stenosis at the right P1-P2 junction. Venous sinuses: Patent as allowed by contrast bolus timing.  Review of the MIP images confirms the above findings IMPRESSION: No large vessel occlusion. Plaque at the ICA origins causes less than 50% stenosis. Multifocal intracranial atherosclerosis with stenoses. Of note, there is focal marked stenosis of the basilar proximal to the superior cerebellar artery origins. Electronically Signed   By: Macy Mis M.D.   On: 10/26/2021 10:35   CT ANGIO NECK W OR WO CONTRAST  Result Date: 10/26/2021 CLINICAL DATA:  Stroke, follow up EXAM: CT ANGIOGRAPHY HEAD AND NECK TECHNIQUE: Multidetector CT imaging of the head and neck was performed using the standard protocol during bolus administration of intravenous contrast. Multiplanar CT image reconstructions and MIPs were obtained to evaluate the vascular anatomy. Carotid stenosis measurements (when applicable) are obtained utilizing NASCET criteria, using the distal internal carotid diameter as the denominator. RADIATION DOSE REDUCTION: This exam was performed according to the departmental dose-optimization program which includes automated exposure control, adjustment of the mA and/or kV according to patient size and/or use of iterative reconstruction technique. CONTRAST:  87m OMNIPAQUE IOHEXOL 350 MG/ML SOLN COMPARISON:  None Available. FINDINGS: CTA NECK Aortic arch: Calcified plaque.  Great vessel origins are patent. Right carotid system: Patent. Calcified plaque at the bifurcation and proximal internal carotid with less than 50% stenosis. Left carotid system: Patent. Noncalcified plaque at the common carotid origin with minimal stenosis. Calcified plaque at the bifurcation and proximal internal carotid with less than 50% stenosis. Vertebral arteries: Patent. Plaque at the left vertebral origin causes mild stenosis. Skeleton: Degenerative changes of the included spine. Other neck: Asymmetric prominence of the left palatine tonsil without discrete mass. Upper chest: Emphysema. Review of the MIP images confirms the above findings  CTA HEAD Anterior circulation: Intracranial internal carotid arteries are patent with calcified plaque causing up to moderate stenosis. Anterior and middle cerebral arteries are patent. There is displacement of the anterior cerebral arteries by the falcine meningioma. High-grade stenosis of proximal left M2 MCA branch. High-grade stenosis of left A2 ACA. Posterior circulation: Intracranial vertebral arteries are patent with atherosclerotic irregularity. There is mild stenosis on the right and moderate stenosis on the left. Basilar artery is patent with atherosclerotic irregularity. There is up to marked stenosis distally. Left posterior communicating  artery is present with fetal origin of the left posterior cerebral artery. The posterior cerebral arteries are patent with atherosclerotic irregularity. Moderate stenosis at the right P1-P2 junction. Venous sinuses: Patent as allowed by contrast bolus timing. Review of the MIP images confirms the above findings IMPRESSION: No large vessel occlusion. Plaque at the ICA origins causes less than 50% stenosis. Multifocal intracranial atherosclerosis with stenoses. Of note, there is focal marked stenosis of the basilar proximal to the superior cerebellar artery origins. Electronically Signed   By: Macy Mis M.D.   On: 10/26/2021 10:35   MR Brain W and Wo Contrast  Result Date: 10/25/2021 CLINICAL DATA:  Meningioma EXAM: MRI HEAD WITHOUT AND WITH CONTRAST TECHNIQUE: Multiplanar, multiecho pulse sequences of the brain and surrounding structures were obtained without and with intravenous contrast. CONTRAST:  46m GADAVIST GADOBUTROL 1 MMOL/ML IV SOLN COMPARISON:  None Available. FINDINGS: Brain: There is a large, acute right paramedian pontine infarct. Punctate focus of acute ischemia in the left parietal white matter. No acute or chronic hemorrhage. Anterior cranial fossa meningioma measures 3.9 x 3.4 x 4.0 cm. There is a large amount of surrounding vasogenic edema in  the inferior left frontal lobe. There is rightward mass effect on the right cingulate gyrus. There is subfalcine herniation of the left cingulate gyrus. Vascular: Major flow voids are preserved. Skull and upper cervical spine: Normal calvarium and skull base. Visualized upper cervical spine and soft tissues are normal. Sinuses/Orbits:No paranasal sinus fluid levels or advanced mucosal thickening. No mastoid or middle ear effusion. Normal orbits. IMPRESSION: 1. Large, acute right paramedian pontine infarct. 2. Anterior cranial fossa meningioma with large amount of surrounding vasogenic edema in the inferior left frontal lobe. Rightward midline shift with subfalcine herniation of the left cingulate gyrus. Electronically Signed   By: KUlyses JarredM.D.   On: 10/25/2021 23:40   CT HEAD WO CONTRAST  Result Date: 10/25/2021 CLINICAL DATA:  Slurred speech and increased confusion. History of hypertension, diabetes, and endometrial cancer. EXAM: CT HEAD WITHOUT CONTRAST TECHNIQUE: Contiguous axial images were obtained from the base of the skull through the vertex without intravenous contrast. RADIATION DOSE REDUCTION: This exam was performed according to the departmental dose-optimization program which includes automated exposure control, adjustment of the mA and/or kV according to patient size and/or use of iterative reconstruction technique. COMPARISON:  None Available. FINDINGS: Brain: Sharply defined low-density encephalomalacia compatible with small remote infarct of the right inferior cerebellum, image 8 series 2. A high density 5.0 by 3.8 by 3.6 cm mass primarily centered in the left anterior cranial fossa but abuts the planum sphenoidale and anterior falx, with 2.0 cm of localized left-to-right midline shift and with extensive vasogenic edema in the left frontal lobe and a small amount of edema tracking into the left temporal lobe. Effacement of part of the frontal horn left lateral ventricle. This mass is highly  likely to be a meningioma. No overt hydrocephalus. No intracranial hemorrhage or definite findings of acute CVA. Vascular: There is atherosclerotic calcification of the cavernous carotid arteries bilaterally. Skull: Unremarkable Sinuses/Orbits: Mild chronic ethmoid sinusitis. Other: No supplemental non-categorized findings. IMPRESSION: 1. 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 Frontal horn of the left lateral ventricle is partially effaced. 2. 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 3. Atherosclerosis. 4. Mild chronic ethmoid  sinusitis. Electronically Signed   By: Van Clines M.D.   On: 10/25/2021 16:27     EKG: normal EKG, normal sinus rhythm, unchanged from previous tracings.   Physical Examination: Temp:  [98 F (36.7 C)-98.6 F (37 C)] 98 F (36.7 C) (07/07 1100) Pulse Rate:  [52-108] 108 (07/07 1100) Resp:  [16-24] 19 (07/07 1100) BP: (139-218)/(74-116) 160/92 (07/07 1100) SpO2:  [92 %-98 %] 97 % (07/07 1100) Weight:  [76.7 kg] 76.7 kg (07/06 1531)  General - Well nourished, well developed, in no apparent distress.  Cardiovascular - Regular rate and rhythm.  Mental Status -  Level of arousal and orientation to time, place, and person were intact. Language including expression and comprehension was assessed and found intact.  Speech not as crisp as usual per son Attention span and concentration were slightly impaired.  Diminished recall.  Cranial Nerves II - XII - II - Visual field intact OU. III, IV, VI - Extraocular movements intact. V - Facial sensation intact bilaterally. VII - Facial movement intact bilaterally. VIII - Hearing & vestibular intact bilaterally. XII - Tongue protrusion intact.  Motor Strength - The patient's strength was normal in BUE and  RLE, 4/5 in LLE and pronator drift was absent.  Diminished fine finger movements on the left and orbits right over left upper extremity.  Mild weakness of left ankle dorsiflexor and hip flexors only bulk was normal and fasciculations were absent.    Sensory - Light touch was assessed and were symmetrical.    Coordination - The patient had normal movements in the hands and feet with no ataxia or dysmetria.  Tremor was absent.  Gait and Station - deferred.   Assessment:  Cassandra Dodson is a 81 y.o. female with history of CKD3, DM2, HTN, GERD, endometrial cancer presenting with slurred speech and left leg weakness. She did not receive IV t-PA due to being outside the window. MRI shows a right paramedian pontine stroke.  Patient also has a large frontal meningioma, and neurosurgery has been consulted for management of this.  Stroke:  right paramedian pontine infarct secondary to small vessel disease likely. There is an incidental large falx meningioma with significant cytotoxic edema and mass effect. CT head 5.0 cm intracranial mass along anterior falx, likely to be a meningioma with vasogenic edema and 2 cm localized midline shift CTA head & neck no LVO, multifocal intracranial stenosis with marked stenosis of basilar artery MRI  larger, acute right paramedian pontine infarct, anterior cranial fossa meningioma 2D Echo pending LDL No results found for requested labs within last 1095 days. HgbA1c 7.0  SCDs for VTE prophylaxis Diet Order             Diet Carb Modified Fluid consistency: Thin; Room service appropriate? Yes  Diet effective now                   aspirin 81 mg daily prior to admission, now on aspirin 81 mg daily.  Therapy recommendations:  pending Disposition:  pending  Large frontal meningioma Patient has 5cm frontal meningioma Decadron for vasogenic edema Neurosurgery consulted  Hypertension Stable Permissive hypertension (OK if < 220/120) but gradually normalize  in 5-7 days Long-term BP goal normotensive  Hyperlipidemia Home meds:  fenofibrate 160 mg daily, resumed in hospital LDL No results found for requested labs within last 1095 days., goal < 70 Add statin if appropriate Continue statin at discharge  Diabetes type II, Uncontrolled HgbA1c 7.0, goal < 7.0 CBGs SSI  Recommend close PCP follow up for better diabetes control  Other Stroke Risk Factors Advanced age Former cigarette smoker  Other Active Problems none  Hospital day # 0   Thank you for this consultation and allowing [place] to participate in the care of this patient.  [place] , MSN, AGACNP-BC [facility] See Amion for schedule and pager information 10/26/2021 12:38 PM   STROKE MD NOTE :  I have personally obtained history,examined this patient, reviewed notes, independently viewed imaging studies, participated in medical decision making and plan of care.ROS completed by me personally and pertinent positives fully documented  I have made any additions or clarifications directly to the above note. Agree with note above.  Patient presented with 2-day history of difficulty walking with dragging the left leg and initial CT scan showed large meningioma neurosurgery was consulted but subsequently MRI has shown her paramedian pontine infarct which is likely responsible for present clinical presentation and the meningioma is mostly asymptomatic. Recommend check ongoing stroke work-up echocardiogram and lipid profile.  Aspirin and Plavix for stroke prevention for 3 weeks followed by aspirin alone unless neurosurgery are considering meningioma resection which can possibly be done electively.  Continue Decadron and management of meningioma as per neurosurgery.  Physical Occupational Therapy consults.  Long discussion with patient and son at the bedside and answered questions.  Greater than 50% time during this 60-minute visit was spent in counseling and coordination of care  about lacunar stroke and incidental meningioma and discussion about evaluation and treatment plan and answering questions. Antony Contras, MD Medical Director [place] Pager: [phone] 10/26/2021 2:43 PM   To contact Stroke Continuity provider, please refer to [email or web address] After hours, contact General Neurology

## 2021-10-27 ENCOUNTER — Inpatient Hospital Stay (HOSPITAL_COMMUNITY): Payer: Medicare Other

## 2021-10-27 DIAGNOSIS — N390 Urinary tract infection, site not specified: Secondary | ICD-10-CM | POA: Diagnosis present

## 2021-10-27 DIAGNOSIS — R900 Intracranial space-occupying lesion found on diagnostic imaging of central nervous system: Principal | ICD-10-CM

## 2021-10-27 DIAGNOSIS — I651 Occlusion and stenosis of basilar artery: Secondary | ICD-10-CM | POA: Diagnosis not present

## 2021-10-27 DIAGNOSIS — N1832 Chronic kidney disease, stage 3b: Secondary | ICD-10-CM | POA: Diagnosis not present

## 2021-10-27 DIAGNOSIS — E1122 Type 2 diabetes mellitus with diabetic chronic kidney disease: Secondary | ICD-10-CM

## 2021-10-27 DIAGNOSIS — E785 Hyperlipidemia, unspecified: Secondary | ICD-10-CM

## 2021-10-27 DIAGNOSIS — G9389 Other specified disorders of brain: Secondary | ICD-10-CM | POA: Diagnosis not present

## 2021-10-27 DIAGNOSIS — I10 Essential (primary) hypertension: Secondary | ICD-10-CM

## 2021-10-27 DIAGNOSIS — E1165 Type 2 diabetes mellitus with hyperglycemia: Secondary | ICD-10-CM

## 2021-10-27 DIAGNOSIS — I6322 Cerebral infarction due to unspecified occlusion or stenosis of basilar arteries: Secondary | ICD-10-CM | POA: Diagnosis not present

## 2021-10-27 DIAGNOSIS — D696 Thrombocytopenia, unspecified: Secondary | ICD-10-CM | POA: Diagnosis not present

## 2021-10-27 LAB — CBC WITH DIFFERENTIAL/PLATELET
Abs Immature Granulocytes: 0.02 10*3/uL (ref 0.00–0.07)
Basophils Absolute: 0 10*3/uL (ref 0.0–0.1)
Basophils Relative: 0 %
Eosinophils Absolute: 0 10*3/uL (ref 0.0–0.5)
Eosinophils Relative: 0 %
HCT: 41.4 % (ref 36.0–46.0)
Hemoglobin: 14.4 g/dL (ref 12.0–15.0)
Immature Granulocytes: 0 %
Lymphocytes Relative: 9 %
Lymphs Abs: 0.7 10*3/uL (ref 0.7–4.0)
MCH: 30.6 pg (ref 26.0–34.0)
MCHC: 34.8 g/dL (ref 30.0–36.0)
MCV: 88.1 fL (ref 80.0–100.0)
Monocytes Absolute: 0.4 10*3/uL (ref 0.1–1.0)
Monocytes Relative: 6 %
Neutro Abs: 6.1 10*3/uL (ref 1.7–7.7)
Neutrophils Relative %: 85 %
Platelets: 151 10*3/uL (ref 150–400)
RBC: 4.7 MIL/uL (ref 3.87–5.11)
RDW: 14.1 % (ref 11.5–15.5)
WBC: 7.2 10*3/uL (ref 4.0–10.5)
nRBC: 0 % (ref 0.0–0.2)

## 2021-10-27 LAB — GLUCOSE, CAPILLARY
Glucose-Capillary: 360 mg/dL — ABNORMAL HIGH (ref 70–99)
Glucose-Capillary: 355 mg/dL — ABNORMAL HIGH (ref 70–99)
Glucose-Capillary: 356 mg/dL — ABNORMAL HIGH (ref 70–99)
Glucose-Capillary: 368 mg/dL — ABNORMAL HIGH (ref 70–99)

## 2021-10-27 LAB — LIPID PANEL
Cholesterol: 198 mg/dL (ref 0–200)
HDL: 53 mg/dL (ref >40)
LDL Cholesterol: 133 mg/dL — ABNORMAL HIGH (ref 0–99)
Total CHOL/HDL Ratio: 3.7 RATIO
Triglycerides: 59 mg/dL (ref <150)
VLDL: 12 mg/dL (ref 0–40)

## 2021-10-27 LAB — BASIC METABOLIC PANEL
Anion gap: 13 (ref 5–15)
BUN: 30 mg/dL — ABNORMAL HIGH (ref 8–23)
CO2: 22 mmol/L (ref 22–32)
Calcium: 9.8 mg/dL (ref 8.9–10.3)
Chloride: 102 mmol/L (ref 98–111)
Creatinine, Ser: 1.3 mg/dL — ABNORMAL HIGH (ref 0.44–1.00)
GFR, Estimated: 41 mL/min — ABNORMAL LOW (ref >60)
Glucose, Bld: 275 mg/dL — ABNORMAL HIGH (ref 70–99)
Potassium: 4.2 mmol/L (ref 3.5–5.1)
Sodium: 137 mmol/L (ref 135–145)

## 2021-10-27 MED ORDER — INSULIN ASPART 100 UNIT/ML IJ SOLN
0.0000 [IU] | Freq: Every day | INTRAMUSCULAR | Status: DC
  Administered 2021-10-27 – 2021-10-28 (×2): 5 [IU] via SUBCUTANEOUS
  Administered 2021-10-29: 3 [IU] via SUBCUTANEOUS
  Administered 2021-10-30: 5 [IU] via SUBCUTANEOUS
  Administered 2021-10-31: 4 [IU] via SUBCUTANEOUS

## 2021-10-27 MED ORDER — IRBESARTAN 75 MG PO TABS
37.5000 mg | ORAL_TABLET | Freq: Every day | ORAL | Status: DC
Start: 2021-10-27 — End: 2021-10-28
  Administered 2021-10-27: 37.5 mg via ORAL
  Filled 2021-10-27: qty 1

## 2021-10-27 MED ORDER — SODIUM CHLORIDE 0.9 % IV SOLN
2.0000 g | INTRAVENOUS | Status: AC
  Administered 2021-10-27 – 2021-10-29 (×3): 2 g via INTRAVENOUS
  Filled 2021-10-27 (×3): qty 20

## 2021-10-27 MED ORDER — INSULIN ASPART 100 UNIT/ML IJ SOLN
0.0000 [IU] | Freq: Three times a day (TID) | INTRAMUSCULAR | Status: DC
  Administered 2021-10-27 (×2): 15 [IU] via SUBCUTANEOUS
  Administered 2021-10-28 (×2): 8 [IU] via SUBCUTANEOUS
  Administered 2021-10-28: 11 [IU] via SUBCUTANEOUS
  Administered 2021-10-29 (×2): 5 [IU] via SUBCUTANEOUS
  Administered 2021-10-29: 3 [IU] via SUBCUTANEOUS
  Administered 2021-10-30: 5 [IU] via SUBCUTANEOUS
  Administered 2021-10-30: 3 [IU] via SUBCUTANEOUS
  Administered 2021-10-30: 8 [IU] via SUBCUTANEOUS
  Administered 2021-10-31: 3 [IU] via SUBCUTANEOUS
  Administered 2021-10-31: 15 [IU] via SUBCUTANEOUS
  Administered 2021-10-31: 3 [IU] via SUBCUTANEOUS
  Administered 2021-11-01: 5 [IU] via SUBCUTANEOUS

## 2021-10-27 MED ORDER — INSULIN GLARGINE-YFGN 100 UNIT/ML ~~LOC~~ SOLN
30.0000 [IU] | Freq: Every day | SUBCUTANEOUS | Status: DC
  Administered 2021-10-27: 30 [IU] via SUBCUTANEOUS
  Filled 2021-10-27 (×2): qty 0.3

## 2021-10-27 MED ORDER — FOLIC ACID 1 MG PO TABS
1.0000 mg | ORAL_TABLET | Freq: Every day | ORAL | Status: DC
  Administered 2021-10-27 – 2021-10-31 (×5): 1 mg via ORAL
  Filled 2021-10-27 (×5): qty 1

## 2021-10-27 MED ORDER — ROSUVASTATIN CALCIUM 20 MG PO TABS
20.0000 mg | ORAL_TABLET | Freq: Every day | ORAL | Status: DC
  Administered 2021-10-27 – 2021-10-31 (×5): 20 mg via ORAL
  Filled 2021-10-27 (×6): qty 1

## 2021-10-27 NOTE — Progress Notes (Addendum)
STROKE TEAM PROGRESS NOTE   INTERVAL HISTORY Patient is seen in her room with no family at the bedside.  She has been hemodynamically stable overnight and her neurological exam is stable.  She will undergo treatment and rehabilitation for her pontine stroke and then will have treatment scheduled for her meningioma.  Vitals:   10/26/21 2301 10/27/21 0302 10/27/21 0740 10/27/21 1046  BP: (!) 164/73 (!) 146/88 128/73 (!) 152/96  Pulse: 77 82 68 73  Resp: '17 18 14 18  '$ Temp: 98.5 F (36.9 C) 98.4 F (36.9 C) 97.7 F (36.5 C) 97.8 F (36.6 C)  TempSrc: Oral Oral Oral Oral  SpO2: 100% 100% 98% 96%  Weight:      Height:       CBC:  Recent Labs  Lab 10/26/21 0225 10/27/21 0234  WBC 5.8 7.2  NEUTROABS 5.1 6.1  HGB 14.5 14.4  HCT 42.6 41.4  MCV 89.9 88.1  PLT 139* 762   Basic Metabolic Panel:  Recent Labs  Lab 10/26/21 0225 10/27/21 0234  NA 138 137  K 3.9 4.2  CL 104 102  CO2 22 22  GLUCOSE 251* 275*  BUN 17 30*  CREATININE 0.96 1.30*  CALCIUM 9.2 9.8  MG 1.8  --   PHOS 2.8  --    Lipid Panel:  Recent Labs  Lab 10/27/21 0234  CHOL 198  TRIG 59  HDL 53  CHOLHDL 3.7  VLDL 12  LDLCALC 133*   HgbA1c:  Recent Labs  Lab 10/26/21 0225  HGBA1C 7.0*   Urine Drug Screen: No results for input(s): "LABOPIA", "COCAINSCRNUR", "LABBENZ", "AMPHETMU", "THCU", "LABBARB" in the last 168 hours.  Alcohol Level  Recent Labs  Lab 10/25/21 1540  ETH <10    IMAGING past 24 hours ECHOCARDIOGRAM COMPLETE  Result Date: 10/26/2021    ECHOCARDIOGRAM REPORT   Patient Name:   Cassandra Dodson Date of Exam: 10/26/2021 Medical Rec #:  [id number]    Height:       65.0 in Accession #:    [id number]   Weight:       169.0 lb Date of Birth:  [birth year]    BSA:          1.841 m Patient Age:    81 years     BP:           168/102 mmHg Patient Gender: F            HR:           82 bpm. Exam Location:  Inpatient Procedure: 2D Echo, Cardiac Doppler and Color Doppler Indications:    Stroke I63.9   History:        Patient has no prior history of Echocardiogram examinations.                 Risk Factors:Hypertension and Diabetes. Chronic kidney disease.  Sonographer:    Darlina Sicilian RDCS Referring Phys: [phone] DANIEL V THOMPSON  Sonographer Comments: Suboptimal parasternal window. Parasternal measurements attempted. IMPRESSIONS  1. Left ventricular ejection fraction, by estimation, is 60 to 65%. The left ventricle has normal function. The left ventricle has no regional wall motion abnormalities. There is moderate asymmetric left ventricular hypertrophy of the basal-septal segment. Left ventricular diastolic parameters are indeterminate.  2. Right ventricular systolic function is normal. The right ventricular size is normal. Tricuspid regurgitation signal is inadequate for assessing PA pressure.  3. The mitral valve is normal in structure. No evidence of mitral valve regurgitation. No evidence  of mitral stenosis.  4. The aortic valve was not well visualized. Aortic valve regurgitation is not visualized. No aortic stenosis is present.  5. The inferior vena cava is normal in size with greater than 50% respiratory variability, suggesting right atrial pressure of 3 mmHg. FINDINGS  Left Ventricle: Left ventricular ejection fraction, by estimation, is 60 to 65%. The left ventricle has normal function. The left ventricle has no regional wall motion abnormalities. The left ventricular internal cavity size was small. There is moderate  asymmetric left ventricular hypertrophy of the basal-septal segment. Left ventricular diastolic parameters are indeterminate. Right Ventricle: The right ventricular size is normal. No increase in right ventricular wall thickness. Right ventricular systolic function is normal. Tricuspid regurgitation signal is inadequate for assessing PA pressure. Left Atrium: Left atrial size was normal in size. Right Atrium: Right atrial size was normal in size. Pericardium: There is no evidence of  pericardial effusion. Mitral Valve: The mitral valve is normal in structure. No evidence of mitral valve regurgitation. No evidence of mitral valve stenosis. Tricuspid Valve: The tricuspid valve is normal in structure. Tricuspid valve regurgitation is trivial. Aortic Valve: The aortic valve was not well visualized. Aortic valve regurgitation is not visualized. No aortic stenosis is present. Pulmonic Valve: The pulmonic valve was not well visualized. Pulmonic valve regurgitation is not visualized. Aorta: The aortic root is normal in size and structure. Venous: The inferior vena cava is normal in size with greater than 50% respiratory variability, suggesting right atrial pressure of 3 mmHg. IAS/Shunts: The interatrial septum was not well visualized.  LEFT VENTRICLE PLAX 2D LVIDd:         3.55 cm   Diastology LVIDs:         2.30 cm   LV e' medial:    4.12 cm/s LV PW:         1.25 cm   LV E/e' medial:  19.8 LV IVS:        1.30 cm   LV e' lateral:   7.53 cm/s LVOT diam:     1.90 cm   LV E/e' lateral: 10.8 LV SV:         61 LV SV Index:   33 LVOT Area:     2.84 cm  RIGHT VENTRICLE RV S prime:     19.50 cm/s TAPSE (M-mode): 2.0 cm LEFT ATRIUM             Index        RIGHT ATRIUM           Index LA diam:        3.40 cm 1.85 cm/m   RA Area:     12.60 cm LA Vol (A2C):   43.7 ml 23.73 ml/m  RA Volume:   23.40 ml  12.71 ml/m LA Vol (A4C):   49.9 ml 27.10 ml/m LA Biplane Vol: 47.6 ml 25.85 ml/m  AORTIC VALVE LVOT Vmax:   88.80 cm/s LVOT Vmean:  64.300 cm/s LVOT VTI:    0.214 m  AORTA Ao Root diam: 2.90 cm MITRAL VALVE MV Area (PHT): 4.19 cm    SHUNTS MV Decel Time: 181 msec    Systemic VTI:  0.21 m MV E velocity: 81.40 cm/s  Systemic Diam: 1.90 cm MV A velocity: 72.40 cm/s MV E/A ratio:  1.12 Oswaldo Milian MD Electronically signed by Oswaldo Milian MD Signature Date/Time: 10/26/2021/5:29:43 PM    Final     PHYSICAL EXAM General:  Alert, well-nourished, well-developed patient in no acute  distress Respiratory:  Regular, unlabored respirations on room air  NEURO:  Mental Status: AA&Ox2  Speech/Language: speech is without dysarthria or aphasia.  Fluency, and comprehension intact.  Cranial Nerves:  II: PERRL. Visual fields full.  III, IV, VI: EOMI. Eyelids elevate symmetrically.  V: Sensation is intact to light touch and symmetrical to face.  VII: Smile is symmetrical.   VIII: hearing intact to voice. IX, X: Phonation is normal.  XII: tongue is midline without fasciculations. Motor: 5/5 strength to all muscle groups tested.  Tone: is normal and bulk is normal Sensation- Intact to light touch bilaterally.  Coordination: FTN intact bilaterally.No drift.  Gait- deferred   ASSESSMENT/PLAN Ms. MAKAIA RAPPA is a 81 y.o. female with history of  HTN, DM, CKD3, GERD and endometrial cancer  presenting with slurred speech for two days and left leg weakness for one day.  Patient's son states that she first seemed to be having problems on 7/4 when she had some difficulty with ambulation, then developed slurred speech and dragging of the left leg while walking.  Clarence shows a left paracentral meningioma with vasogenic edema, and MRI brain shows a right paramedian pontine stroke. Patient denies any prior known history of meningioma or brain problems.  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  Therapy recommendations:  CIR Disposition:  pending  Basilar artery stenosis CTA head & neck marked stenosis of BA proximal to SCAs Per son, pt baseline walks with walker due to left hip and b/l knee replacement but lives independently PTA Now with mild left facial droop and left UE drift post stroke Given high risk of recurrent stroke with devastating outcome, will discuss with IR Dr. Estanislado Pandy to see if BA stenting is a good idea  Left frontal large meningioma CT head 5.0 cm intracranial mass along anterior falx, likely to be a meningioma with vasogenic edema and 2 cm localized midline shift MRI left anterior cranial fossa meningioma with vasogenic edema and MLS Neurosurgery on board Plan for possible surgical resection in 8-12 weeks first On decardron  Hypertension Home meds:  telmisartan 80 mg daily Stable but fluctuate On low dose avapro Avoid low BP Long-term BP goal normotensive  Hyperlipidemia Home meds:  fenofibrate 160 mg daily, resumed in hospital LDL 133, goal < 70 Add rosuvastatin 20 mg daily  Continue statin at discharge  Diabetes type II Uncontrolled Home meds:  metformin 1000 mg BID HgbA1c 7.0, goal < 7.0 CBGs SSI On insulin now Hyperglycemia especially on decardron now Close PCP follow up for better DM control in the setting of steroid use  Dysphagia Speech on board Now on nectar thick liquid Aspiration precaution  Other Stroke Risk Factors Advanced Age >/= 24  Former cigarette smoker  Other Active Problems Hx of left hip and b/l Knee replacement   Hospital day # [place] ,  MSN, AGACNP-BC [facility] See Amion for schedule and pager information 10/27/2021 1:11 PM   ATTENDING NOTE: I reviewed above note and agree with the assessment and plan. Pt was seen and examined.   81 year old female with history of hypertension, diabetes, CKD 3, endometrial carcinoma admitted for left-sided  weakness and slurred speech.  CT showed left frontal large meningioma with vasogenic edema and 2 cm of left to right midline shift, old right cerebellar infarct.  MRI showed right pontine infarct with left anterior fossa large meningioma, vasogenic edema and midline shift.  CT head and neck bilateral V4 stenosis, bilateral ICA siphon and bulb atherosclerosis, but also high-grade stenosis basilar artery proximal to SCAs.  EF 60 to 65%.  A1c 7.0, LDL 133, creatinine 1.09->0.96.  BP fluctuate.  On exam, son at bedside.  Patient sitting in bed for lunch, awake, alert, eyes open, orientated to age, place, month and people, but not to year. No aphasia, fluent language, following all simple commands, mild dysarthria. Able to name and repeat. No gaze palsy, tracking bilaterally, visual field full.  Mild left facial droop. Tongue midline. RUE 5/5, LUE 4+/5 with continued drift. Bilaterally LEs 4/5 proximal and distal. Sensation symmetrical bilaterally, b/l FTN intact but slow, gait not tested.   Etiology for patient current pontine stroke likely due to large vessel disease with high-grade stenosis of basilar artery.  Patient lives independently PTA and currently mild symptoms from her pontine stroke.  Given high-grade stenosis basilar artery and potential devastating recurrent stroke, will consult IR to consider revascularization.  Continue aspirin and Plavix DAPT, added Crestor 20, continue fenofibrate.  Neurosurgery on board, on Decadron, consider possible meningioma resection in 8 to 12 weeks.  Patient has mild dysphagia, speech on board, on nectar thick liquid.  Patient still has hyperglycemia, especially on Decadron, now on insulin.  PT/OT recommend CIR.  We will follow.  For detailed assessment and plan, please refer to above/below as I have made changes wherever appropriate.   Rosalin Hawking, MD PhD Stroke Neurology 10/27/2021 1:56 PM   discussed with Dr. Estanislado Pandy. I spent extensive time with the patient, more  than 50% of which was spent in counseling and coordination of care, reviewing test results, images and medication, and discussing the diagnosis, treatment plan and potential prognosis. This patient's care requiresreview of multiple databases, neurological assessment, discussion with family, other specialists and medical decision making of high complexity. I had long discussion with son at bedside, updated pt current condition, treatment plan and potential prognosis, and answered all the questions.  He expressed understanding and appreciation.        To contact Stroke Continuity provider, please refer to [email or web address] After hours, contact General Neurology

## 2021-10-27 NOTE — Procedures (Signed)
Routine EEG Report  Cassandra Dodson is a 81 y.o. female with a history of stroke and meningioma who is undergoing an EEG to evaluate for seizures.  Report: This EEG was acquired with electrodes placed according to the International 10-20 electrode system (including Fp1, Fp2, F3, F4, C3, C4, P3, P4, O1, O2, T3, T4, T5, T6, A1, A2, Fz, Cz, Pz). The following electrodes were missing or displaced: none.  The occipital dominant rhythm was 6 Hz. This activity is reactive to stimulation. Drowsiness was manifested by background fragmentation; deeper stages of sleep were identified by K complexes and sleep spindles. There was focal slowing over the left frontal region. There were no interictal epileptiform discharges. There were no electrographic seizures identified. Photic stimulation and hyperventilation were not performed.  Impression and clinical correlation: This EEG was obtained while awake and asleep and is abnormal due to mild diffuse slowing indicative of global cerebral dysfunction and superimposed focal slowing over the left frontal region in the area of patient's known meningioma. Epileptiform abnormalities were not seen during this recording.  Su Monks, MD [facility] [phone]  If 7pm- 7am, please page neurology on call as listed in [place].

## 2021-10-27 NOTE — Progress Notes (Addendum)
PROGRESS NOTE    Cassandra Dodson  ESP:[id number] DOB: [birth year] DOA: 10/25/2021 PCP: Cassandra Baton, MD    Chief Complaint  Patient presents with   Weakness   Slurred Speech    Brief Narrative:  HPI per Cassandra Dodson is a 81 y.o. female with medical history significant for endometrial cancer, essential hypertension, type 2 diabetes, GERD, hypothyroidism, CKD 3B who initially presented to [facility] ED with complaints of slurred speech, left-sided weakness x2 days.  Associated with mild confusion.  No headache.  Noncontrast head CT done in the ED revealed 5.0 cm long axis intracranial mass highly likely to be a meningioma associated with 2.0 cm localized left to right midline shift with extensive vasogenic edema in the left frontal lobe.  EDP discussed the case with neurosurgery Cassandra Dodson who recommended MRI brain with and without contrast and to add IV Decadron for the vasogenic edema.   Upon assessment at [facility], the patient is alert, follows commands, denies any headaches.   ED Course: Tmax 98.6.  BP 170/93, pulse 68, respiratory rate 29, saturation 96% on room air.  Lab studies significant for glucose 211, BUN 20, creatinine 1.09 GFR 51.  CBC essentially unremarkable except for platelet count of 146.    Assessment & Plan:   Principal Problem:   Brain mass Active Problems:   CVA (cerebral vascular accident) ([place])   Stage 3b chronic kidney disease (HCC)   Thrombocytopenia ([place])   History of endometrial cancer   UTI (urinary tract infection)   Intracranial mass   Hyperlipidemia   Type 2 diabetes mellitus with stage 3b chronic kidney disease, without long-term current use of insulin ([place])   #1 intracranial mass, seen on CT -Patient presented with left-sided weakness and slurred speech. -Noncontrast CT done in the ED with 5.0 cm long axis left paracentral intracranial mass along the planum sphenoidale and anterior falx, highly likely to be a meningioma associated  with 2.0 cm of localized left right midline shift and with extensive vasogenic edema in the left frontal lobe.  Frontal horn of the left lateral ventricle partially effaced.  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 -MRI brain done as recommended per neurosurgery with large acute right paramedian pontine infarct, anterior cranial fossa meningioma with large amount of surrounding vasogenic edema in the anterior left frontal lobe.  Rightward midline shift with subfalcine herniation of the left cingulate gyrus. -Neurosurgery consulted who recommended IV Decadron 4 mg every 6 hours. -Neurosurgery also recommending conservative treatment at this time allowing patient to recover from stroke before considering major surgical intervention to debulk and remove left frontal meningioma. -Continue GI prophylaxis with PPI.   -Per neurosurgery.  2.  Acute right paramedian pontine infarct -Noted on MRI brain. -Stroke work-up underway and stroke pathway ordered. -CT angiogram head and neck ordered with no LVO, multivessel multifocal stenosis.  Focal marked stenosis of the basilar proximal to superior cerebellar artery origins. 2D echo ordered. -Patient resumed on home regimen aspirin for secondary prophylaxis. -Permissive hypertension -Neurology consulted and feels stroke appears to be due to small vessel disease, also noted that patient seemed to have bradyphrenia and executive dysfunction which could be due to edema on the left frontal lobe. -2D echo with EF of 60 to 65%, NWMA, moderate asymmetric LVH of the basal septal, no embolic source noted. -TSH within normal limits at 0.993, folate decreased at 5.8, vitamin B12 at 287. -LDL of 133. -EEG done. -Start patient on a statin with goal LDL <  70. -Patient currently on aspirin and started on Plavix for dual antiplatelet treatment. -Patient being followed by PT/OT/SLP. -Per neurology.  3 basilar artery stenosis -CT angiogram head and neck with marked stenosis  of basilar artery proximal to SCA's. -Being followed by neurology who are going to discuss case with interventional radiology to see if BA stenting is a good idea.  4.  CKD stage IIIb -Stable.  5.  Acute thrombocytopenia -Patient with no overt bleeding. -Improved -Follow.  6.  History of endometrial cancer -Patient follows with OB/GYN oncology, Cassandra Dodson in the outpatient setting. -Outpatient follow-up.  7.  Diabetes mellitus type 2 -Hemoglobin A1c 7.0 (10/26/2021) -Hold oral hypoglycemic agents. -CBG 360 this morning. -Elevated blood glucose likely secondary to steroids. -Increase Semglee to 30 units daily. -SSI.  8.  Hyperlipidemia -LDL of 33, goal LDL < 70 -Resume home regimen of fenofibrate. -Neurology recommending starting rosuvastatin 20 mg daily.  9.  Hypertension -Decrease dose of Avapro to 37.5 mg daily. -Permissive hypertension secondary to acute CVA. -Avoid low blood pressure.  #10 UTI -Urinalysis concerning for UTI. -Check urine cultures. -IV Rocephin.   DVT prophylaxis: SCDs Code Status: Full Family Communication: Updated patient.  No family at bedside. Disposition: ? CIR  Status is: Inpatient The patient will require care spanning > 2 midnights and should be moved to inpatient because: Severity of illness   Consultants:  Neurosurgery: Cassandra Dodson Neurology: Cassandra Dodson 10/26/2021  Procedures:  CT head 10/25/2021 CT angiogram head and neck 10/26/2021 pending MRI brain 10/25/2021 2D echo 10/26/2021 EEG 10/26/2021    Antimicrobials:  IV Rocephin 10/27/2021>>>>   Subjective: Patient sitting up in bed.  States no significant change with some left-sided weakness.  Speech therapy at bedside stated patient with some coughing with oral intake.  No chest pain.  No shortness of breath.  No abdominal pain.  No family at bedside.    Objective: Vitals:   10/27/21 0302 10/27/21 0740 10/27/21 1046 10/27/21 1440  BP: (!) 146/88 128/73 (!) 152/96 138/71  Pulse: 82  68 73 87  Resp: '18 14 18 15  '$ Temp: 98.4 F (36.9 C) 97.7 F (36.5 C) 97.8 F (36.6 C) 97.8 F (36.6 C)  TempSrc: Oral Oral Oral Oral  SpO2: 100% 98% 96% 100%  Weight:      Height:        Intake/Output Summary (Last 24 hours) at 10/27/2021 1828 Last data filed at 10/27/2021 1153 Gross per 24 hour  Intake --  Output 600 ml  Net -600 ml   Filed Weights   10/25/21 1531  Weight: 76.7 kg    Examination:  General exam: NAD Respiratory system: CTA B anterior lung fields.  No wheezes, no crackles, no rhonchi.  Normal respiratory effort. Cardiovascular system: Regular rate rhythm no murmurs rubs or gallops.  No JVD.  No lower extremity edema.  Gastrointestinal system: Abdomen is soft, nontender, nondistended, positive bowel sounds.  No rebound.  No guarding.   Central nervous system: Alert and oriented.  Some dysarthric speech.  Left upper extremity weakness no focal neurological deficits. Extremities: Symmetric 5 x 5 power. Skin: No rashes, lesions or ulcers Psychiatry: Judgement and insight appear normal. Mood & affect appropriate.     Data Reviewed: I have personally reviewed following labs and imaging studies  CBC: Recent Labs  Lab 10/25/21 1540 10/26/21 0225 10/27/21 0234  WBC 5.7 5.8 7.2  NEUTROABS 3.7 5.1 6.1  HGB 14.1 14.5 14.4  HCT 43.1 42.6 41.4  MCV 91.7 89.9 88.1  PLT 146* 139* 509    Basic Metabolic Panel: Recent Labs  Lab 10/25/21 1540 10/26/21 0225 10/27/21 0234  NA 135 138 137  K 3.8 3.9 4.2  CL 102 104 102  CO2 '24 22 22  '$ GLUCOSE 211* 251* 275*  BUN 20 17 30*  CREATININE 1.09* 0.96 1.30*  CALCIUM 9.6 9.2 9.8  MG  --  1.8  --   PHOS  --  2.8  --     GFR: Estimated Creatinine Clearance: 34.2 mL/min (A) (by C-G formula based on SCr of 1.3 mg/dL (H)).  Liver Function Tests: Recent Labs  Lab 10/25/21 1540 10/26/21 0225  AST 12* 15  ALT 11 14  ALKPHOS 51 51  BILITOT 0.5 0.8  PROT 6.9 6.7  ALBUMIN 4.1 3.6    CBG: Recent Labs  Lab  10/26/21 1539 10/26/21 2115 10/27/21 0812 10/27/21 1130 10/27/21 1706  GLUCAP 331* 374* 360* 355* 356*     No results found for this or any previous visit (from the past 240 hour(s)).       Radiology Studies: ECHOCARDIOGRAM COMPLETE  Result Date: 10/26/2021    ECHOCARDIOGRAM REPORT   Patient Name:   DAYSHA ASHMORE Date of Exam: 10/26/2021 Medical Rec #:  [id number]    Height:       65.0 in Accession #:    [id number]   Weight:       169.0 lb Date of Birth:  12-Oct-1940    BSA:          1.841 m Patient Age:    39 years     BP:           168/102 mmHg Patient Gender: F            HR:           82 bpm. Exam Location:  Inpatient Procedure: 2D Echo, Cardiac Doppler and Color Doppler Indications:    Stroke I63.9  History:        Patient has no prior history of Echocardiogram examinations.                 Risk Factors:Hypertension and Diabetes. Chronic kidney disease.  Sonographer:    Darlina Sicilian RDCS Referring Phys: [phone] Audriella Blakeley V Ralphine Hinks  Sonographer Comments: Suboptimal parasternal window. Parasternal measurements attempted. IMPRESSIONS  1. Left ventricular ejection fraction, by estimation, is 60 to 65%. The left ventricle has normal function. The left ventricle has no regional wall motion abnormalities. There is moderate asymmetric left ventricular hypertrophy of the basal-septal segment. Left ventricular diastolic parameters are indeterminate.  2. Right ventricular systolic function is normal. The right ventricular size is normal. Tricuspid regurgitation signal is inadequate for assessing PA pressure.  3. The mitral valve is normal in structure. No evidence of mitral valve regurgitation. No evidence of mitral stenosis.  4. The aortic valve was not well visualized. Aortic valve regurgitation is not visualized. No aortic stenosis is present.  5. The inferior vena cava is normal in size with greater than 50% respiratory variability, suggesting right atrial pressure of 3 mmHg. FINDINGS  Left Ventricle: Left  ventricular ejection fraction, by estimation, is 60 to 65%. The left ventricle has normal function. The left ventricle has no regional wall motion abnormalities. The left ventricular internal cavity size was small. There is moderate  asymmetric left ventricular hypertrophy of the basal-septal segment. Left ventricular diastolic parameters are indeterminate. Right Ventricle: The right ventricular size is normal. No increase in right ventricular wall thickness. Right ventricular  systolic function is normal. Tricuspid regurgitation signal is inadequate for assessing PA pressure. Left Atrium: Left atrial size was normal in size. Right Atrium: Right atrial size was normal in size. Pericardium: There is no evidence of pericardial effusion. Mitral Valve: The mitral valve is normal in structure. No evidence of mitral valve regurgitation. No evidence of mitral valve stenosis. Tricuspid Valve: The tricuspid valve is normal in structure. Tricuspid valve regurgitation is trivial. Aortic Valve: The aortic valve was not well visualized. Aortic valve regurgitation is not visualized. No aortic stenosis is present. Pulmonic Valve: The pulmonic valve was not well visualized. Pulmonic valve regurgitation is not visualized. Aorta: The aortic root is normal in size and structure. Venous: The inferior vena cava is normal in size with greater than 50% respiratory variability, suggesting right atrial pressure of 3 mmHg. IAS/Shunts: The interatrial septum was not well visualized.  LEFT VENTRICLE PLAX 2D LVIDd:         3.55 cm   Diastology LVIDs:         2.30 cm   LV e' medial:    4.12 cm/s LV PW:         1.25 cm   LV E/e' medial:  19.8 LV IVS:        1.30 cm   LV e' lateral:   7.53 cm/s LVOT diam:     1.90 cm   LV E/e' lateral: 10.8 LV SV:         61 LV SV Index:   33 LVOT Area:     2.84 cm  RIGHT VENTRICLE RV S prime:     19.50 cm/s TAPSE (M-mode): 2.0 cm LEFT ATRIUM             Index        RIGHT ATRIUM           Index LA diam:         3.40 cm 1.85 cm/m   RA Area:     12.60 cm LA Vol (A2C):   43.7 ml 23.73 ml/m  RA Volume:   23.40 ml  12.71 ml/m LA Vol (A4C):   49.9 ml 27.10 ml/m LA Biplane Vol: 47.6 ml 25.85 ml/m  AORTIC VALVE LVOT Vmax:   88.80 cm/s LVOT Vmean:  64.300 cm/s LVOT VTI:    0.214 m  AORTA Ao Root diam: 2.90 cm MITRAL VALVE MV Area (PHT): 4.19 cm    SHUNTS MV Decel Time: 181 msec    Systemic VTI:  0.21 m MV E velocity: 81.40 cm/s  Systemic Diam: 1.90 cm MV A velocity: 72.40 cm/s MV E/A ratio:  1.12 Oswaldo Milian MD Electronically signed by Oswaldo Milian MD Signature Date/Time: 10/26/2021/5:29:43 PM    Final    CT ANGIO HEAD W OR WO CONTRAST  Result Date: 10/26/2021 CLINICAL DATA:  Stroke, follow up EXAM: CT ANGIOGRAPHY HEAD AND NECK TECHNIQUE: Multidetector CT imaging of the head and neck was performed using the standard protocol during bolus administration of intravenous contrast. Multiplanar CT image reconstructions and MIPs were obtained to evaluate the vascular anatomy. Carotid stenosis measurements (when applicable) are obtained utilizing NASCET criteria, using the distal internal carotid diameter as the denominator. RADIATION DOSE REDUCTION: This exam was performed according to the departmental dose-optimization program which includes automated exposure control, adjustment of the mA and/or kV according to patient size and/or use of iterative reconstruction technique. CONTRAST:  77m OMNIPAQUE IOHEXOL 350 MG/ML SOLN COMPARISON:  None Available. FINDINGS: CTA NECK Aortic arch: Calcified plaque.  [employer]  vessel origins are patent. Right carotid system: Patent. Calcified plaque at the bifurcation and proximal internal carotid with less than 50% stenosis. Left carotid system: Patent. Noncalcified plaque at the common carotid origin with minimal stenosis. Calcified plaque at the bifurcation and proximal internal carotid with less than 50% stenosis. Vertebral arteries: Patent. Plaque at the left vertebral origin  causes mild stenosis. Skeleton: Degenerative changes of the included spine. Other neck: Asymmetric prominence of the left palatine tonsil without discrete mass. Upper chest: Emphysema. Review of the MIP images confirms the above findings CTA HEAD Anterior circulation: Intracranial internal carotid arteries are patent with calcified plaque causing up to moderate stenosis. Anterior and middle cerebral arteries are patent. There is displacement of the anterior cerebral arteries by the falcine meningioma. High-grade stenosis of proximal left M2 MCA branch. High-grade stenosis of left A2 ACA. Posterior circulation: Intracranial vertebral arteries are patent with atherosclerotic irregularity. There is mild stenosis on the right and moderate stenosis on the left. Basilar artery is patent with atherosclerotic irregularity. There is up to marked stenosis distally. Left posterior communicating artery is present with fetal origin of the left posterior cerebral artery. The posterior cerebral arteries are patent with atherosclerotic irregularity. Moderate stenosis at the right P1-P2 junction. Venous sinuses: Patent as allowed by contrast bolus timing. Review of the MIP images confirms the above findings IMPRESSION: No large vessel occlusion. Plaque at the ICA origins causes less than 50% stenosis. Multifocal intracranial atherosclerosis with stenoses. Of note, there is focal marked stenosis of the basilar proximal to the superior cerebellar artery origins. Electronically Signed   By: Macy Mis M.D.   On: 10/26/2021 10:35   CT ANGIO NECK W OR WO CONTRAST  Result Date: 10/26/2021 CLINICAL DATA:  Stroke, follow up EXAM: CT ANGIOGRAPHY HEAD AND NECK TECHNIQUE: Multidetector CT imaging of the head and neck was performed using the standard protocol during bolus administration of intravenous contrast. Multiplanar CT image reconstructions and MIPs were obtained to evaluate the vascular anatomy. Carotid stenosis measurements  (when applicable) are obtained utilizing NASCET criteria, using the distal internal carotid diameter as the denominator. RADIATION DOSE REDUCTION: This exam was performed according to the departmental dose-optimization program which includes automated exposure control, adjustment of the mA and/or kV according to patient size and/or use of iterative reconstruction technique. CONTRAST:  39m OMNIPAQUE IOHEXOL 350 MG/ML SOLN COMPARISON:  None Available. FINDINGS: CTA NECK Aortic arch: Calcified plaque.  Great vessel origins are patent. Right carotid system: Patent. Calcified plaque at the bifurcation and proximal internal carotid with less than 50% stenosis. Left carotid system: Patent. Noncalcified plaque at the common carotid origin with minimal stenosis. Calcified plaque at the bifurcation and proximal internal carotid with less than 50% stenosis. Vertebral arteries: Patent. Plaque at the left vertebral origin causes mild stenosis. Skeleton: Degenerative changes of the included spine. Other neck: Asymmetric prominence of the left palatine tonsil without discrete mass. Upper chest: Emphysema. Review of the MIP images confirms the above findings CTA HEAD Anterior circulation: Intracranial internal carotid arteries are patent with calcified plaque causing up to moderate stenosis. Anterior and middle cerebral arteries are patent. There is displacement of the anterior cerebral arteries by the falcine meningioma. High-grade stenosis of proximal left M2 MCA branch. High-grade stenosis of left A2 ACA. Posterior circulation: Intracranial vertebral arteries are patent with atherosclerotic irregularity. There is mild stenosis on the right and moderate stenosis on the left. Basilar artery is patent with atherosclerotic irregularity. There is up to marked stenosis distally. Left posterior communicating  artery is present with fetal origin of the left posterior cerebral artery. The posterior cerebral arteries are patent with  atherosclerotic irregularity. Moderate stenosis at the right P1-P2 junction. Venous sinuses: Patent as allowed by contrast bolus timing. Review of the MIP images confirms the above findings IMPRESSION: No large vessel occlusion. Plaque at the ICA origins causes less than 50% stenosis. Multifocal intracranial atherosclerosis with stenoses. Of note, there is focal marked stenosis of the basilar proximal to the superior cerebellar artery origins. Electronically Signed   By: Macy Mis M.D.   On: 10/26/2021 10:35   MR Brain W and Wo Contrast  Result Date: 10/25/2021 CLINICAL DATA:  Meningioma EXAM: MRI HEAD WITHOUT AND WITH CONTRAST TECHNIQUE: Multiplanar, multiecho pulse sequences of the brain and surrounding structures were obtained without and with intravenous contrast. CONTRAST:  17m GADAVIST GADOBUTROL 1 MMOL/ML IV SOLN COMPARISON:  None Available. FINDINGS: Brain: There is a large, acute right paramedian pontine infarct. Punctate focus of acute ischemia in the left parietal white matter. No acute or chronic hemorrhage. Anterior cranial fossa meningioma measures 3.9 x 3.4 x 4.0 cm. There is a large amount of surrounding vasogenic edema in the inferior left frontal lobe. There is rightward mass effect on the right cingulate gyrus. There is subfalcine herniation of the left cingulate gyrus. Vascular: Major flow voids are preserved. Skull and upper cervical spine: Normal calvarium and skull base. Visualized upper cervical spine and soft tissues are normal. Sinuses/Orbits:No paranasal sinus fluid levels or advanced mucosal thickening. No mastoid or middle ear effusion. Normal orbits. IMPRESSION: 1. Large, acute right paramedian pontine infarct. 2. Anterior cranial fossa meningioma with large amount of surrounding vasogenic edema in the inferior left frontal lobe. Rightward midline shift with subfalcine herniation of the left cingulate gyrus. Electronically Signed   By: KUlyses JarredM.D.   On: 10/25/2021 23:40         Scheduled Meds:  aspirin EC  81 mg Oral Daily   calcium carbonate  1,250 mg Oral Q breakfast   clopidogrel  75 mg Oral Daily   dexamethasone (DECADRON) injection  4 mg Intravenous Q6H   fenofibrate  160 mg Oral QPC breakfast   folic acid  1 mg Oral Daily   insulin aspart  0-15 Units Subcutaneous TID WC   insulin aspart  0-5 Units Subcutaneous QHS   insulin glargine-yfgn  30 Units Subcutaneous Daily   irbesartan  37.5 mg Oral Daily   pantoprazole  40 mg Oral Daily   rosuvastatin  20 mg Oral Daily   Continuous Infusions:  cefTRIAXone (ROCEPHIN)  IV 2 g (10/27/21 1429)     LOS: 1 day    Time spent: 40 minutes    DIrine Seal MD [facility]   To contact the attending provider between 7A-7P or the covering provider during after hours 7P-7A, please log into the web site [email or web address] and access using universal [place] password for that web site. If you do not have the password, please call the hospital operator.  10/27/2021, 6:28 PM

## 2021-10-27 NOTE — Progress Notes (Addendum)
EEG complete - results pending 

## 2021-10-27 NOTE — Progress Notes (Signed)
Physical Therapy Treatment Patient Details Name: Cassandra Dodson MRN: [id number] DOB: October 27, 1940 Today's Date: 10/27/2021   History of Present Illness Pt is an 81 y/o female admitted secondary to slurred speech and L sided weakness. 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 PMH including but not limited to HTN, DM, CKD3, GERD and endometrial cancer.    PT Comments    The pt was agreeable to session and was able to progress OOB mobility this session. She initially needed increased assist for sit-stand transfers due to poor functional power and posterior lean in standing. After continued reps, pt improved to minA with sit-stand transfers and was able to take small lateral steps to recliner prior to completing short bout of ambulation from the chair. Continues to need at least minA for all mobility, and is at increased risk for falls. Continue to recommend acute inpatient rehab when medically stable for d/c to maximize functional recovery.     Recommendations for follow up therapy are one component of a multi-disciplinary discharge planning process, led by the attending physician.  Recommendations may be updated based on patient status, additional functional criteria and insurance authorization.  Follow Up Recommendations  Acute inpatient rehab (3hours/day)     Assistance Recommended at Discharge Frequent or constant Supervision/Assistance  Patient can return home with the following A little help with walking and/or transfers;A little help with bathing/dressing/bathroom;Assistance with cooking/housework;Direct supervision/assist for medications management;Direct supervision/assist for financial management;Assist for transportation;Help with stairs or ramp for entrance    Equipment Recommendations  None recommended by PT    Recommendations for Other Services       Precautions / Restrictions Precautions Precautions: Fall Precaution Comments: urinary incontenance Restrictions Weight Bearing Restrictions: No     Mobility  Bed Mobility Overal bed mobility: Needs Assistance Bed Mobility: Supine to Sit     Supine to sit: HOB elevated, Mod assist     General bed mobility comments: modA to complete with pt having difficulty scooting to EOB or elevating trunk    Transfers Overall transfer level: Needs assistance Equipment used: Rolling walker (2 wheels) Transfers: Sit to/from Stand Sit to Stand: Min assist, Mod assist           General transfer comment: increased time and effort with strong posterior lean, needing modA initially but improved to minA with reps. repeated cues for hand positioning    Ambulation/Gait Ambulation/Gait assistance: Mod assist, Min assist Gait Distance (Feet): 5 Feet (+ 5 ft) Assistive device: Rolling walker (2 wheels) Gait Pattern/deviations: Decreased stride length, Narrow base of support, Trunk flexed Gait velocity: decreased   Pre-gait activities: standing marches with BUE support and max cues for posture and positioning General Gait Details: pt initially unable to complete static marches despite modA of 1, needing frequent seated rest break. after x3 attempts, pt was able to complete lateral steps to recliner with minA and minA to manage RW. then minA to manage forwards steps from recliner with chair follow   Modified Rankin (Stroke Patients Only) Modified Rankin (Stroke Patients Only) Pre-Morbid Rankin Score: Moderately severe disability Modified Rankin: Moderately severe disability     Balance Overall balance assessment: Needs assistance Sitting-balance support: Feet supported Sitting balance-Leahy Scale: Fair     Standing balance support: During functional activity, Bilateral upper extremity  supported,  Single extremity supported Standing balance-Leahy Scale: Poor Standing balance comment: dependent on BUE support and up to modA                            Cognition Arousal/Alertness: Awake/alert Behavior During Therapy: Flat affect Overall Cognitive Status: History of cognitive impairments - at baseline Area of Impairment: Orientation, Attention, Memory, Following commands, Safety/judgement, Awareness, Problem solving                 Orientation Level: Disoriented to, Time Current Attention Level: Sustained Memory: Decreased short-term memory Following Commands: Follows one step commands with increased time, Follows one step commands inconsistently Safety/Judgement: Decreased awareness of deficits Awareness: Intellectual Problem Solving: Slow processing, Decreased initiation, Difficulty sequencing, Requires verbal cues, Requires tactile cues General Comments: increased time and cues to complete instructions, at times pt needing repeated cues, max cues for safety and hand placement        Exercises General Exercises - Lower Extremity Ankle Circles/Pumps: AROM, Both, 10 reps, Supine Heel Slides: AROM, Both, 10 reps, Supine    General Comments General comments (skin integrity, edema, etc.): VSS on RA      Pertinent Vitals/Pain Pain Assessment Pain Assessment: No/denies pain     PT Goals (current goals can now be found in the care plan section) Acute Rehab PT Goals Patient Stated Goal: "I want some water!" - son requesting rehab PT Goal Formulation: With patient/family Time For Goal Achievement: 11/09/21 Potential to Achieve Goals: Good Progress towards PT goals: Progressing toward goals    Frequency    Min 4X/week      PT Plan Current plan remains appropriate       AM-PAC PT "6 Clicks" Mobility   Outcome Measure  Help needed turning from your back to your side while in a flat bed without using bedrails?: A Little Help needed  moving from lying on your back to sitting on the side of a flat bed without using bedrails?: A Lot Help needed moving to and from a bed to a chair (including a wheelchair)?: A Little Help needed standing up from a chair using your arms (e.g., wheelchair or bedside chair)?: A Little Help needed to walk in hospital room?: A Little Help needed climbing 3-5 steps with a railing? : A Lot 6 Click Score: 16    End of Session Equipment Utilized During Treatment: Gait belt Activity Tolerance: Patient tolerated treatment well Patient left: with call bell/phone within reach;in chair;with chair alarm set Nurse Communication: Mobility status PT Visit Diagnosis: Other abnormalities of gait and mobility (R26.89);Other symptoms and signs involving the nervous system (Y77.412)     Time: 8786-7672 PT Time Calculation (min) (ACUTE ONLY): 29 min  Charges:  $Therapeutic Exercise: 8-22 mins $Therapeutic Activity: 8-22 mins                     West Carbo, PT, DPT   Acute Rehabilitation Department   Sandra Cockayne 10/27/2021, 4:03 PM

## 2021-10-27 NOTE — Procedures (Addendum)
Objective Swallowing Evaluation: Type of Study: FEES-Fiberoptic Endoscopic Evaluation of Swallow   Patient Details  Name: Cassandra Dodson MRN: [id number] Date of Birth: [birth year]  Today's Date: 10/27/2021 Time: SLP Start Time (ACUTE ONLY): 7829 -SLP Stop Time (ACUTE ONLY): 1330  SLP Time Calculation (min) (ACUTE ONLY): 45 min   Past Medical History:  Past Medical History:  Diagnosis Date   Arthritis    CKD (chronic kidney disease), stage III ([place])    patient denies   Depression    Diabetes mellitus without complication ([place])    Difficult intravenous access    Endometrial cancer ([place])    GERD (gastroesophageal reflux disease)    Headache    History of radiation therapy 11/04/2019-12/01/2019   Endometrial HDR; Dr. Gery Pray   Hypertension    Hypothyroidism    Neuromuscular disorder ([place])    neuropathy in feet   Osteoarthritis    PMB (postmenopausal bleeding)    PONV (postoperative nausea and vomiting)    severe nausea and vomiting after knee replacement 06-2014, did ok with 2018 knee replacement   Urinary frequency    Wears glasses    Past Surgical History:  Past Surgical History:  Procedure Laterality Date   BREAST SURGERY     cyst removed   CESAREAN SECTION     DILATION AND CURETTAGE OF UTERUS N/A 06/24/2019   Procedure: DILATATION AND CURETTAGE;  Surgeon: Lafonda Mosses, MD;  Location: [place];  Service: Gynecology;  Laterality: N/A;   FRACTURE SURGERY     right knee   INTRAUTERINE DEVICE (IUD) INSERTION N/A 06/24/2019   Procedure: INTRAUTERINE DEVICE (IUD) INSERTION MIRENA;  Surgeon: Lafonda Mosses, MD;  Location: [facility];  Service: Gynecology;  Laterality: N/A;   IRRIGATION AND DEBRIDEMENT SEBACEOUS CYST     JOINT REPLACEMENT Right    right   LYMPH NODE DISSECTION N/A 09/14/2019   Procedure: LYMPH NODE DISSECTION;  Surgeon: Lafonda Mosses, MD;  Location: [facility];  Service: Gynecology;  Laterality: N/A;   ROBOTIC  ASSISTED TOTAL HYSTERECTOMY WITH BILATERAL SALPINGO OOPHERECTOMY Bilateral 09/14/2019   Procedure: XI ROBOTIC ASSISTED TOTAL HYSTERECTOMY WITH BILATERAL SALPINGO OOPHORECTOMY;  Surgeon: Lafonda Mosses, MD;  Location: [facility];  Service: Gynecology;  Laterality: Bilateral;   SENTINEL NODE BIOPSY N/A 09/14/2019   Procedure: SENTINEL NODE BIOPSY;  Surgeon: Lafonda Mosses, MD;  Location: [facility];  Service: Gynecology;  Laterality: N/A;   teeth extration     TONSILLECTOMY     TOTAL HIP ARTHROPLASTY Right 02/23/2015   Procedure: RIGHT TOTAL HIP ARTHROPLASTY ANTERIOR APPROACH;  Surgeon: Leandrew Koyanagi, MD;  Location: [place];  Service: Orthopedics;  Laterality: Right;   TOTAL KNEE ARTHROPLASTY Left 06/27/2016   Procedure: LEFT TOTAL KNEE ARTHROPLASTY;  Surgeon: Leandrew Koyanagi, MD;  Location: [place];  Service: Orthopedics;  Laterality: Left;   HPI: Pt is an 81 y/o female admitted secondary to slurred speech and L sided weakness. 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 PMH including but not limited to HTN, DM, CKD3, GERD and endometrial cancer.   No data recorded   Recommendations for follow up therapy are one component of a multi-disciplinary discharge planning process, led by the attending physician.  Recommendations may be updated based on patient status,  additional functional criteria and insurance authorization.  Assessment / Plan / Recommendation     10/27/2021    2:17 PM  Clinical Impressions  Clinical Impression Patient presents with a mild-moderate oropharyngeal dysphagia. Oral phase mildly prolonged however suspect this was related to decreased attention/distractions rather than focal deficit given that previous observation at bedside largely [facility]. Pharyngeally  however, she presents with delayed swallow initiation resulting in penetration of both thin and nectar thick liquids. Nectar thick liquids only flash penetrated however while penetrates remained in the laryngeal vestibule, very close to the vocal cords with thin liquids. Additionally, green dye tinged secretions noted to come up from the vocal cords during cued cough post intake of thin liquids indicative of aspiration. Vocal quality remained wet throughout majority of evaluation, mostly related however to increased pharyngeal secretions, clear but mildly frothy/thick. Cued cough strong and effective to clear. Overall, pharyngeal strength good. Recommend dysphagia 3 solids with nectar thick liquids. Cognitively, patient will be unable to carry out consistent use of strict aspiration precautions, thus chin tuck not attempted today. Hopeful for improved swallowing function as it relates to pontine infarct. Continued cognitive deficits related to meningioma may continue to impact abilities as well however. Note presence of red, round excrescence toward tip of epiglottis on the right. Please see photo below.  Defer further w/u to MD.  SLP Visit Diagnosis Dysphagia, oropharyngeal phase (R13.12)         10/27/2021    2:17 PM  Treatment Recommendations  Treatment Recommendations Therapy as outlined in treatment plan below        10/27/2021    2:17 PM  Prognosis  Prognosis for Safe Diet Advancement Good  Barriers to Reach Goals Cognitive deficits       10/27/2021    2:17 PM  Diet Recommendations  SLP Diet Recommendations Dysphagia 3 (Mech soft) solids;Nectar thick liquid  Liquid Administration via Cup;No straw  Medication Administration Whole meds with puree  Compensations Slow rate;Small sips/bites  Postural Changes Seated upright at 90 degrees         10/27/2021    2:17 PM  Other Recommendations  Oral Care Recommendations Oral care BID  Other Recommendations Order thickener from  pharmacy;Prohibited food (jello, ice cream, thin soups);Remove water pitcher  Follow Up Recommendations Acute inpatient rehab (3hours/day)  Assistance recommended at discharge Frequent or constant Supervision/Assistance       10/27/2021    2:17 PM  Frequency and Duration   Speech Therapy Frequency (ACUTE ONLY) min 2x/week  Treatment Duration 2 weeks         10/27/2021    2:17 PM  Oral Phase  Oral Phase Impaired  Oral - Puree WFL  Oral - Mech Soft Delayed oral transit  Oral - Regular NT  Oral - Multi-Consistency NT  Oral - Pill NT       10/27/2021    2:17 PM  Pharyngeal Phase  Pharyngeal Phase Impaired  Pharyngeal- Nectar Teaspoon Delayed swallow initiation-pyriform sinuses  Pharyngeal Material does not enter airway  Pharyngeal- Nectar Cup Delayed swallow initiation-pyriform sinuses;Penetration/Aspiration before swallow;Penetration/Aspiration during swallow  Pharyngeal Material enters airway, remains ABOVE vocal cords then ejected out  Pharyngeal- Nectar Straw NT  Pharyngeal- Thin Teaspoon NT  Pharyngeal- Thin Cup Penetration/Aspiration before swallow;Delayed swallow initiation-pyriform sinuses  Pharyngeal Material enters airway, CONTACTS cords and then ejected out;Other (Comment)  Pharyngeal- Thin Straw Delayed swallow initiation-pyriform sinuses;Penetration/Aspiration before swallow  Pharyngeal Material enters airway, remains ABOVE vocal cords and not ejected out  Pharyngeal- Puree Delayed  swallow initiation-vallecula;Delayed swallow initiation-pyriform sinuses  Pharyngeal Material does not enter airway  Pharyngeal- Mechanical Soft Delayed swallow initiation-vallecula;Delayed swallow initiation-pyriform sinuses  Pharyngeal Material does not enter airway  Pharyngeal- Regular NT  Pharyngeal- Multi-consistency NT  Pharyngeal- Pill NT        10/27/2021    2:17 PM  Cervical Esophageal Phase   Cervical Esophageal Phase [facility]    Paxton Kanaan MA, CCC-SLP  Averyana Pillars  [place] 10/27/2021, 2:30 PM

## 2021-10-27 NOTE — Progress Notes (Signed)
Overall patient looks good.  Minimally symptomatic from her pontine infarct.  Denies headaches.  Overall doing well.  Neuro exam nonfocal.  Patient with recently discovered large subfrontal meningioma.  No indication for intervention at this time.  Patient to continue treatment for her recently discovered pontine infarct.  Please contact us with new issues or problems.

## 2021-10-27 NOTE — Evaluation (Signed)
Speech Language Pathology Evaluation Patient Details Name: Cassandra Dodson MRN: [id number] DOB: 07-16-1940 Today's Date: 10/27/2021 Time: 4431-5400 SLP Time Calculation (min) (ACUTE ONLY): 22 min  Problem List:  Patient Active Problem List   Diagnosis Date Noted   UTI (urinary tract infection) 10/27/2021   CVA (cerebral vascular accident) ([place]) 10/26/2021   Stage 3b chronic kidney disease ([place])    Thrombocytopenia ([place])    History of endometrial cancer    Brain mass 10/25/2021   Endometrial cancer ([place]) 05/13/2019   Hypertension    Total knee replacement status 06/27/2016   Primary osteoarthritis of left knee 04/30/2016   Osteoarthritis of right hip 02/23/2015   Hip arthritis 02/23/2015   Past Medical History:  Past Medical History:  Diagnosis Date   Arthritis    CKD (chronic kidney disease), stage III ([place])    patient denies   Depression    Diabetes mellitus without complication ([place])    Difficult intravenous access    Endometrial cancer ([place])    GERD (gastroesophageal reflux disease)    Headache    History of radiation therapy 11/04/2019-12/01/2019   Endometrial HDR; Dr. Gery Pray   Hypertension    Hypothyroidism    Neuromuscular disorder ([place])    neuropathy in feet   Osteoarthritis    PMB (postmenopausal bleeding)    PONV (postoperative nausea and vomiting)    severe nausea and vomiting after knee replacement 06-2014, did ok with 2018 knee replacement   Urinary frequency    Wears glasses    Past Surgical History:  Past Surgical History:  Procedure Laterality Date   BREAST SURGERY     cyst removed   CESAREAN SECTION     DILATION AND CURETTAGE OF UTERUS N/A 06/24/2019   Procedure: DILATATION AND CURETTAGE;  Surgeon: Lafonda Mosses, MD;  Location: [place];  Service: Gynecology;  Laterality: N/A;   FRACTURE SURGERY     right knee   INTRAUTERINE DEVICE (IUD) INSERTION N/A 06/24/2019   Procedure: INTRAUTERINE DEVICE (IUD) INSERTION MIRENA;   Surgeon: Lafonda Mosses, MD;  Location: [facility];  Service: Gynecology;  Laterality: N/A;   IRRIGATION AND DEBRIDEMENT SEBACEOUS CYST     JOINT REPLACEMENT Right    right   LYMPH NODE DISSECTION N/A 09/14/2019   Procedure: LYMPH NODE DISSECTION;  Surgeon: Lafonda Mosses, MD;  [facility];  Service: Gynecology;  Laterality: N/A;   ROBOTIC ASSISTED TOTAL HYSTERECTOMY WITH BILATERAL SALPINGO OOPHERECTOMY Bilateral 09/14/2019   Procedure: XI ROBOTIC ASSISTED TOTAL HYSTERECTOMY WITH BILATERAL SALPINGO OOPHORECTOMY;  Surgeon: Lafonda Mosses, MD;  Location[facility];  Service: Gynecology;  Laterality: Bilateral;   SENTINEL NODE BIOPSY N/A 09/14/2019   Procedure: SENTINEL NODE BIOPSY;  Surgeon: Lafonda Mosses, MD;  [facility];  Service: Gynecology;  Laterality: N/A;   teeth extration     TONSILLECTOMY     TOTAL HIP ARTHROPLASTY Right 02/23/2015   Procedure: RIGHT TOTAL HIP ARTHROPLASTY ANTERIOR APPROACH;  Surgeon: Leandrew Koyanagi, MD;  Location: [place];  Service: Orthopedics;  Laterality: Right;   TOTAL KNEE ARTHROPLASTY Left 06/27/2016   Procedure: LEFT TOTAL KNEE ARTHROPLASTY;  Surgeon: Leandrew Koyanagi, MD;  Location: [place];  Service: Orthopedics;  Laterality: Left;   HPI:  Pt is an 81 y/o female admitted secondary to slurred speech and L sided weakness. MRI of the brain revealed a large acute right paramedian pontine infarct and an anterior cranial fossa meningioma with large amount of  surrounding vasogenic  edema in the inferior left frontal lobe; Rightward midline shift with subfalcine herniation of the left  cingulate gyrus. PMH including but not limited to HTN, DM, CKD3, GERD and endometrial cancer.   Assessment / Plan / Recommendation Clinical Impression  Patient presents with impairements in the areas of attention, memory, and reasoning along with general mild confusion and mild dysarthria due to left sided facial weakness. Facial weakness and dysarthria are  related to pontine infarct I suspect however cognitive deficits likely related to frontal meningioma. Discussed with son who reported that patient was cognitively intact and independent prior to admission. Patient will benefit from acute SLP f/u to address cognition with focus on dysarthria and compensatory strategies for cognitive deficits. Do not suspect that cognition will improve significantly until after meningioma surgically removed which I believe will not be complete this admission.    SLP Assessment  SLP Recommendation/Assessment: Patient needs continued Speech Lanaguage Pathology Services SLP Visit Diagnosis: Frontal lobe and executive function deficit Frontal lobe and executive function deficit following: Cerebral infarction    Recommendations for follow up therapy are one component of a multi-disciplinary discharge planning process, led by the attending physician.  Recommendations may be updated based on patient status, additional functional criteria and insurance authorization.    Follow Up Recommendations  Acute inpatient rehab (3hours/day)    Assistance Recommended at Discharge  Frequent or constant Supervision/Assistance     Frequency and Duration min 1 x/week  2 weeks      SLP Evaluation Cognition  Overall Cognitive Status: Impaired/Different from baseline Arousal/Alertness: Awake/alert Orientation Level: Oriented to person;Oriented to place;Oriented to situation Attention: Sustained Sustained Attention: Impaired Sustained Attention Impairment: Verbal basic Memory: Impaired Memory Impairment: Retrieval deficit;Decreased short term memory Decreased Short Term Memory: Verbal basic;Functional basic Awareness: Impaired Awareness Impairment: Intellectual impairment Problem Solving: Appears intact Executive Function: Reasoning Reasoning: Impaired Reasoning Impairment: Verbal complex Behaviors: Impulsive Safety/Judgment: Impaired       Comprehension  Auditory  Comprehension Overall Auditory Comprehension: Appears within functional limits for tasks assessed Visual Recognition/Discrimination Discrimination: Within Function Limits Reading Comprehension Reading Status: Within funtional limits    Expression Expression Primary Mode of Expression: Verbal Verbal Expression Overall Verbal Expression: Appears within functional limits for tasks assessed Written Expression Dominant Hand: Right Written Expression: Not tested   Oral / Motor  Oral Motor/Sensory Function Overall Oral Motor/Sensory Function: Mild impairment Facial ROM: Reduced left;Suspected CN VII (facial) dysfunction Facial Symmetry: Abnormal symmetry left;Suspected CN VII (facial) dysfunction Facial Strength: Within Functional Limits Facial Sensation: Within Functional Limits Lingual ROM: Within Functional Limits Lingual Symmetry: Within Functional Limits Lingual Strength: Within Functional Limits Lingual Sensation: Within Functional Limits Velum: Within Functional Limits Mandible: Within Functional Limits Motor Speech Overall Motor Speech: Impaired Respiration: Within functional limits Phonation: Normal Resonance: Within functional limits Articulation: Impaired Level of Impairment: Conversation Intelligibility: Intelligible Motor Planning: Witnin functional limits Effective Techniques: Slow rate           Oziah Vitanza MA, CCC-SLP  Doniven Vanpatten [place] 10/27/2021, 2:11 PM

## 2021-10-27 NOTE — Evaluation (Signed)
Clinical/Bedside Swallow Evaluation Patient Details  Name: Cassandra Dodson MRN: [id number] Date of Birth: [birth year]  Today's Date: 10/27/2021 Time: SLP Start Time (ACUTE ONLY): 6967 SLP Stop Time (ACUTE ONLY): 8938 SLP Time Calculation (min) (ACUTE ONLY): 15 min  Past Medical History:  Past Medical History:  Diagnosis Date   Arthritis    CKD (chronic kidney disease), stage III ([place])    patient denies   Depression    Diabetes mellitus without complication ([place])    Difficult intravenous access    Endometrial cancer ([place])    GERD (gastroesophageal reflux disease)    Headache    History of radiation therapy 11/04/2019-12/01/2019   Endometrial HDR; Dr. Gery Pray   Hypertension    Hypothyroidism    Neuromuscular disorder ([place])    neuropathy in feet   Osteoarthritis    PMB (postmenopausal bleeding)    PONV (postoperative nausea and vomiting)    severe nausea and vomiting after knee replacement 06-2014, did ok with 2018 knee replacement   Urinary frequency    Wears glasses    Past Surgical History:  Past Surgical History:  Procedure Laterality Date   BREAST SURGERY     cyst removed   CESAREAN SECTION     DILATION AND CURETTAGE OF UTERUS N/A 06/24/2019   Procedure: DILATATION AND CURETTAGE;  Surgeon: Lafonda Mosses, MD;  Location: [place];  Service: Gynecology;  Laterality: N/A;   FRACTURE SURGERY     right knee   INTRAUTERINE DEVICE (IUD) INSERTION N/A 06/24/2019   Procedure: INTRAUTERINE DEVICE (IUD) INSERTION MIRENA;  Surgeon: Lafonda Mosses, MD;  [facility];  Service: Gynecology;  Laterality: N/A;   IRRIGATION AND DEBRIDEMENT SEBACEOUS CYST     JOINT REPLACEMENT Right    right   LYMPH NODE DISSECTION N/A 09/14/2019   Procedure: LYMPH NODE DISSECTION;  Surgeon: Lafonda Mosses, MD;  Location: [facility];  Service: Gynecology;  Laterality: N/A;   ROBOTIC ASSISTED TOTAL HYSTERECTOMY WITH BILATERAL SALPINGO OOPHERECTOMY  Bilateral 09/14/2019   Procedure: XI ROBOTIC ASSISTED TOTAL HYSTERECTOMY WITH BILATERAL SALPINGO OOPHORECTOMY;  Surgeon: Lafonda Mosses, MD;  Location: [facility];  Service: Gynecology;  Laterality: Bilateral;   SENTINEL NODE BIOPSY N/A 09/14/2019   Procedure: SENTINEL NODE BIOPSY;  Surgeon: Lafonda Mosses, MD;  Location: [facility];  Service: Gynecology;  Laterality: N/A;   teeth extration     TONSILLECTOMY     TOTAL HIP ARTHROPLASTY Right 02/23/2015   Procedure: RIGHT TOTAL HIP ARTHROPLASTY ANTERIOR APPROACH;  Surgeon: Leandrew Koyanagi, MD;  Location: [place];  Service: Orthopedics;  Laterality: Right;   TOTAL KNEE ARTHROPLASTY Left 06/27/2016   Procedure: LEFT TOTAL KNEE ARTHROPLASTY;  Surgeon: Leandrew Koyanagi, MD;  Location: [place];  Service: Orthopedics;  Laterality: Left;   HPI:  Pt is an 81 y/o female admitted secondary to slurred speech and L sided weakness. 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 PMH including but not limited to HTN, DM, CKD3, GERD and endometrial cancer.    Assessment / Plan / Recommendation  Clinical Impression  While completing cognitive-linguistic evaluation, noted patient with severely wet vocal quality with ability to clear with cueing but quickly returning. Requested order for swallow evaluation which was complete at bedside. Observed patient consuming pm  meal and patient with significant s/s of aspiration including continued wet vocal quality and both immediate and delayed cough. Note that patient impulsively self feeding, large boluses in left cheek with eventual ability to clear indepedently with extra time. In light of bedside presentation and acute Pontine CVA, recommend instrumental swallow eval to determine origin of dysphagia and least  restrictive diet. Will complete FEES ASAP, consent received from son. SLP Visit Diagnosis: Dysphagia, oropharyngeal phase (R13.12) Frontal lobe and executive function deficit following: Cerebral infarction       Diet Recommendation NPO        Other  Recommendations Oral Care Recommendations: Oral care BID    Recommendations for follow up therapy are one component of a multi-disciplinary discharge planning process, led by the attending physician.  Recommendations may be updated based on patient status, additional functional criteria and insurance authorization.  Follow up Recommendations Acute inpatient rehab (3hours/day)      Assistance Recommended at Discharge Frequent or constant Supervision/Assistance     Frequency and Duration min 1 x/week           Swallow Study   General HPI: Pt is an 81 y/o female admitted secondary to slurred speech and L sided weakness. 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 PMH including but not limited to HTN, DM, CKD3, GERD and endometrial cancer. Type of Study: Bedside Swallow Evaluation Previous Swallow Assessment: none Diet Prior to this Study: Regular;Thin liquids Temperature Spikes Noted: No Respiratory Status: Room air History of Recent Intubation: No Behavior/Cognition: Alert;Cooperative;Pleasant mood;Confused Oral Cavity Assessment: Within Functional Limits Oral Care Completed by SLP: No Oral Cavity - Dentition: Missing dentition Vision: Functional for self-feeding Self-Feeding Abilities: Able to feed self Patient Positioning: Upright in bed Baseline Vocal Quality: Wet Volitional Cough: Strong Volitional Swallow: Able to elicit    Oral/Motor/Sensory Function Overall Oral Motor/Sensory Function: Mild  impairment Facial ROM: Reduced left;Suspected CN VII (facial) dysfunction Facial Symmetry: Abnormal symmetry left;Suspected CN VII (facial) dysfunction Facial Strength: Within Functional Limits Facial Sensation: Within Functional Limits Lingual ROM: Within Functional Limits Lingual Symmetry: Within Functional Limits Lingual Strength: Within Functional Limits Lingual Sensation: Within Functional Limits Velum: Within Functional Limits Mandible: Within Functional Limits   Ice Chips Ice chips: Not tested   Thin Liquid Thin Liquid: Impaired Presentation: Straw;Cup;Self Fed Pharyngeal  Phase Impairments: Wet Vocal Quality;Cough - Delayed;Cough - Immediate    Nectar Thick Nectar Thick Liquid: Not tested   Honey Thick Honey Thick Liquid: Not tested   Puree Puree: Not tested   Solid     Solid: Impaired Presentation: Self Fed Oral Phase Impairments: Impaired mastication Oral Phase Functional Implications: Prolonged oral transit (consuming large boluses) Pharyngeal Phase Impairments: Cough - Delayed;Wet Vocal Quality     Kaliyan Osbourn MA, CCC-SLP  Hugh Garrow Meryl 10/27/2021,2:17 PM

## 2021-10-27 NOTE — Progress Notes (Signed)
Inpatient Rehab Admissions Coordinator:    I spoke with pt and her son regarding potential CIR admit. She is interested. Son can provide 24/7 care at d/c. I will open a case with her insurance and pursue for admit.  Clemens Catholic, [place], [place] Admissions Coordinator  [phone] ([place]) [phone] (office)

## 2021-10-28 DIAGNOSIS — N1832 Chronic kidney disease, stage 3b: Secondary | ICD-10-CM | POA: Diagnosis not present

## 2021-10-28 DIAGNOSIS — D696 Thrombocytopenia, unspecified: Secondary | ICD-10-CM | POA: Diagnosis not present

## 2021-10-28 DIAGNOSIS — R900 Intracranial space-occupying lesion found on diagnostic imaging of central nervous system: Secondary | ICD-10-CM | POA: Diagnosis not present

## 2021-10-28 DIAGNOSIS — I6322 Cerebral infarction due to unspecified occlusion or stenosis of basilar arteries: Secondary | ICD-10-CM | POA: Diagnosis not present

## 2021-10-28 DIAGNOSIS — I651 Occlusion and stenosis of basilar artery: Secondary | ICD-10-CM | POA: Diagnosis not present

## 2021-10-28 DIAGNOSIS — G9389 Other specified disorders of brain: Secondary | ICD-10-CM | POA: Diagnosis not present

## 2021-10-28 LAB — CBC WITH DIFFERENTIAL/PLATELET
Abs Immature Granulocytes: 0.04 10*3/uL (ref 0.00–0.07)
Basophils Absolute: 0 10*3/uL (ref 0.0–0.1)
Basophils Relative: 0 %
Eosinophils Absolute: 0 10*3/uL (ref 0.0–0.5)
Eosinophils Relative: 0 %
HCT: 45 % (ref 36.0–46.0)
Hemoglobin: 15.4 g/dL — ABNORMAL HIGH (ref 12.0–15.0)
Immature Granulocytes: 0 %
Lymphocytes Relative: 11 %
Lymphs Abs: 0.9 10*3/uL (ref 0.7–4.0)
MCH: 30.3 pg (ref 26.0–34.0)
MCHC: 34.2 g/dL (ref 30.0–36.0)
MCV: 88.6 fL (ref 80.0–100.0)
Monocytes Absolute: 0.7 10*3/uL (ref 0.1–1.0)
Monocytes Relative: 8 %
Neutro Abs: 7.3 10*3/uL (ref 1.7–7.7)
Neutrophils Relative %: 81 %
Platelets: 159 10*3/uL (ref 150–400)
RBC: 5.08 MIL/uL (ref 3.87–5.11)
RDW: 14.2 % (ref 11.5–15.5)
WBC: 9 10*3/uL (ref 4.0–10.5)
nRBC: 0 % (ref 0.0–0.2)

## 2021-10-28 LAB — BASIC METABOLIC PANEL
Anion gap: 11 (ref 5–15)
BUN: 39 mg/dL — ABNORMAL HIGH (ref 8–23)
CO2: 24 mmol/L (ref 22–32)
Calcium: 10.1 mg/dL (ref 8.9–10.3)
Chloride: 103 mmol/L (ref 98–111)
Creatinine, Ser: 1.32 mg/dL — ABNORMAL HIGH (ref 0.44–1.00)
GFR, Estimated: 40 mL/min — ABNORMAL LOW (ref >60)
Glucose, Bld: 184 mg/dL — ABNORMAL HIGH (ref 70–99)
Potassium: 4.2 mmol/L (ref 3.5–5.1)
Sodium: 138 mmol/L (ref 135–145)

## 2021-10-28 LAB — GLUCOSE, CAPILLARY
Glucose-Capillary: 255 mg/dL — ABNORMAL HIGH (ref 70–99)
Glucose-Capillary: 314 mg/dL — ABNORMAL HIGH (ref 70–99)
Glucose-Capillary: 287 mg/dL — ABNORMAL HIGH (ref 70–99)
Glucose-Capillary: 352 mg/dL — ABNORMAL HIGH (ref 70–99)

## 2021-10-28 MED ORDER — DEXAMETHASONE SODIUM PHOSPHATE 4 MG/ML IJ SOLN
4.0000 mg | Freq: Two times a day (BID) | INTRAMUSCULAR | Status: DC
Start: 2021-10-28 — End: 2021-11-05
  Administered 2021-10-28 – 2021-11-05 (×16): 4 mg via INTRAVENOUS
  Filled 2021-10-28 (×16): qty 1

## 2021-10-28 MED ORDER — IRBESARTAN 75 MG PO TABS
75.0000 mg | ORAL_TABLET | Freq: Every day | ORAL | Status: DC
  Administered 2021-10-28: 75 mg via ORAL
  Filled 2021-10-28 (×2): qty 1

## 2021-10-28 MED ORDER — DEXAMETHASONE SODIUM PHOSPHATE 4 MG/ML IJ SOLN
4.0000 mg | Freq: Three times a day (TID) | INTRAMUSCULAR | Status: DC
Start: 2021-10-28 — End: 2021-10-28
  Administered 2021-10-28: 4 mg via INTRAVENOUS
  Filled 2021-10-28: qty 1

## 2021-10-28 MED ORDER — INSULIN GLARGINE-YFGN 100 UNIT/ML ~~LOC~~ SOLN
34.0000 [IU] | Freq: Every day | SUBCUTANEOUS | Status: DC
  Administered 2021-10-28: 34 [IU] via SUBCUTANEOUS
  Filled 2021-10-28 (×2): qty 0.34

## 2021-10-28 NOTE — Progress Notes (Addendum)
STROKE TEAM PROGRESS NOTE   INTERVAL HISTORY Patient is seen in her room with no family at the bedside.  She has been hemodynamically stable overnight and her neurological exam is stable.  Plan is for cerebral angiogram and basilar artery angioplasty or stenting on Tuesday.  Vitals:   10/27/21 2304 10/28/21 0259 10/28/21 0706 10/28/21 1056  BP:  (!) 192/168 (!) 171/92 (!) 141/99  Pulse:  75 63 (!) 103  Resp:  '17 19 18  '$ Temp: 98.2 F (36.8 C) 97.8 F (36.6 C) (!) 97.2 F (36.2 C) 97.6 F (36.4 C)  TempSrc: Axillary Axillary Axillary Oral  SpO2:  98% 95% 99%  Weight:      Height:       CBC:  Recent Labs  Lab 10/27/21 0234 10/28/21 0230  WBC 7.2 9.0  NEUTROABS 6.1 7.3  HGB 14.4 15.4*  HCT 41.4 45.0  MCV 88.1 88.6  PLT 151 712    Basic Metabolic Panel:  Recent Labs  Lab 10/26/21 0225 10/27/21 0234 10/28/21 0230  NA 138 137 138  K 3.9 4.2 4.2  CL 104 102 103  CO2 '22 22 24  '$ GLUCOSE 251* 275* 184*  BUN 17 30* 39*  CREATININE 0.96 1.30* 1.32*  CALCIUM 9.2 9.8 10.1  MG 1.8  --   --   PHOS 2.8  --   --     Lipid Panel:  Recent Labs  Lab 10/27/21 0234  CHOL 198  TRIG 59  HDL 53  CHOLHDL 3.7  VLDL 12  LDLCALC 133*    HgbA1c:  Recent Labs  Lab 10/26/21 0225  HGBA1C 7.0*    Urine Drug Screen: No results for input(s): "LABOPIA", "COCAINSCRNUR", "LABBENZ", "AMPHETMU", "THCU", "LABBARB" in the last 168 hours.  Alcohol Level  Recent Labs  Lab 10/25/21 1540  ETH <10     IMAGING past 24 hours EEG adult  Result Date: 10/27/2021 Derek Jack, MD     10/27/2021  7:08 PM Routine EEG Report Cassandra Dodson is a 81 y.o. female with a history of stroke and meningioma who is undergoing an EEG to evaluate for seizures. Report: This EEG was acquired with electrodes placed according to the International 10-20 electrode system (including Fp1, Fp2, F3, F4, C3, C4, P3, P4, O1, O2, T3, T4, T5, T6, A1, A2, Fz, Cz, Pz). The following electrodes were missing or displaced:  none. The occipital dominant rhythm was 6 Hz. This activity is reactive to stimulation. Drowsiness was manifested by background fragmentation; deeper stages of sleep were identified by K complexes and sleep spindles. There was focal slowing over the left frontal region. There were no interictal epileptiform discharges. There were no electrographic seizures identified. Photic stimulation and hyperventilation were not performed. Impression and clinical correlation: This EEG was obtained while awake and asleep and is abnormal due to mild diffuse slowing indicative of global cerebral dysfunction and superimposed focal slowing over the left frontal region in the area of patient's known meningioma. Epileptiform abnormalities were not seen during this recording. Su Monks, MD [facility] [phone] If 7pm- 7am, please page neurology on call as listed in [place].    PHYSICAL EXAM General:  Alert, well-nourished, well-developed patient in no acute distress Respiratory:  Regular, unlabored respirations on room air  NEURO:  Mental Status: AA&Ox2  Speech/Language: speech is without dysarthria or aphasia.  Fluency, and comprehension intact.  Cranial Nerves:  II: PERRL. Visual fields full.  III, IV, VI: EOMI. Eyelids elevate symmetrically.  V: Sensation is intact  to light touch and symmetrical to face.  VII: Smile is symmetrical.   VIII: hearing intact to voice. IX, X: Phonation is normal.  XII: tongue is midline without fasciculations. Motor: 5/5 strength to all muscle groups tested.  Tone: is normal and bulk is normal Sensation- Intact to light touch bilaterally.  Coordination: FTN intact bilaterally.Slight left arm drift  Gait- deferred   ASSESSMENT/PLAN [name] is a 81 y.o. female with history of  HTN, DM, CKD3, GERD and endometrial cancer  presenting with slurred speech for two days and left leg weakness for one day.  Patient's son states that she first seemed to be having  problems on 7/4 when she had some difficulty with ambulation, then developed slurred speech and dragging of the left leg while walking.  [place] shows a left paracentral meningioma with vasogenic edema, and MRI brain shows a right paramedian pontine stroke. Patient denies any prior known history of meningioma or brain problems.  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  Therapy recommendations:  CIR Disposition:  pending  Basilar artery stenosis CTA head & neck marked stenosis of BA proximal to SCAs Per son, pt baseline walks with walker due to left hip and b/l knee replacement but lives independently PTA Now with mild left facial droop and left UE drift post stroke Given high risk of recurrent stroke with devastating outcome, basilar artery angioplasty or stent planned for Tuesday  Left frontal large meningioma CT head 5.0 cm intracranial mass along anterior falx, likely to be a meningioma with vasogenic edema and 2 cm localized midline shift MRI left anterior cranial fossa meningioma with vasogenic edema and MLS Neurosurgery on board Plan for possible surgical resection in 8-12 weeks first On decardron  Hypertension Home meds:  telmisartan 80 mg daily Stable but fluctuate On low dose avapro Avoid low BP Long-term BP goal normotensive  Hyperlipidemia Home  meds:  fenofibrate 160 mg daily, resumed in hospital LDL 133, goal < 70 Add rosuvastatin 20 mg daily  Continue statin at discharge  Diabetes type II Uncontrolled Home meds:  metformin 1000 mg BID HgbA1c 7.0, goal < 7.0 CBGs SSI On insulin now Hyperglycemia especially on decardron now Close PCP follow up for better DM control in the setting of steroid use  Dysphagia Speech on board Now on nectar thick liquid Aspiration precaution  Other Stroke Risk Factors Advanced Age >/= 49  Former cigarette smoker  Other Active Problems Hx of left hip and b/l Knee replacement   Hospital day # [place] , MSN, AGACNP-BC [facility] See Amion for schedule and pager information 10/28/2021 12:58 PM   ATTENDING NOTE: I reviewed above note and agree with the assessment and plan. Pt was seen and examined.   Patient lying bed, no family at bedside.  She is awake alert, orientated x3.  Neuro stable,  still have mild left facial droop, left pronator drift improved from yesterday.  Still on dysphagia 3 and nectar thick liquid, less coughing than yesterday.  On DAPT and statin.  Interventional radiology Dr. Estanislado Pandy discussed with patient and son, plan for angioplasty/stent of basilar artery Tuesday.  Still has hypoglycemia in the setting of Decadron use, will consult diabetic coordinator.  For detailed assessment and plan, please refer to above/below as I have made changes wherever appropriate.   Rosalin Hawking, MD PhD Stroke Neurology 10/28/2021 6:43 PM    To contact Stroke Continuity provider, please refer to [email or web address] After hours, contact General Neurology

## 2021-10-28 NOTE — Progress Notes (Addendum)
PROGRESS NOTE    Cassandra Dodson  HAL:[id number] DOB: 11-13-1940 DOA: 10/25/2021 PCP: Shon Baton, MD    Chief Complaint  Patient presents with   Weakness   Slurred Speech    Brief Narrative:  HPI per Dr. Camillia Herter is a 81 y.o. female with medical history significant for endometrial cancer, essential hypertension, type 2 diabetes, GERD, hypothyroidism, CKD 3B who initially presented to [facility] ED with complaints of slurred speech, left-sided weakness x2 days.  Associated with mild confusion.  No headache.  Noncontrast head CT done in the ED revealed 5.0 cm long axis intracranial mass highly likely to be a meningioma associated with 2.0 cm localized left to right midline shift with extensive vasogenic edema in the left frontal lobe.  EDP discussed the case with neurosurgery Dr. Ellene Route who recommended MRI brain with and without contrast and to add IV Decadron for the vasogenic edema.   Upon assessment at [facility], the patient is alert, follows commands, denies any headaches.   ED Course: Tmax 98.6.  BP 170/93, pulse 68, respiratory rate 29, saturation 96% on room air.  Lab studies significant for glucose 211, BUN 20, creatinine 1.09 GFR 51.  CBC essentially unremarkable except for platelet count of 146.    Assessment & Plan:   Principal Problem:   Brain mass Active Problems:   CVA (cerebral vascular accident) ([place])   Stage 3b chronic kidney disease (HCC)   Thrombocytopenia (HCC)   History of endometrial cancer   UTI (urinary tract infection)   Intracranial mass   Hyperlipidemia   Type 2 diabetes mellitus with stage 3b chronic kidney disease, without long-term current use of insulin ([place])   Basilar artery stenosis   #1 intracranial mass, seen on CT -Patient presented with left-sided weakness and slurred speech. -Noncontrast CT done in the ED with 5.0 cm long axis left paracentral intracranial mass along the planum sphenoidale and anterior falx, highly likely to  be a meningioma associated with 2.0 cm of localized left right midline shift and with extensive vasogenic edema in the left frontal lobe.  Frontal horn of the left lateral ventricle partially effaced.  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 -MRI brain done as recommended per neurosurgery with large acute right paramedian pontine infarct, anterior cranial fossa meningioma with large amount of surrounding vasogenic edema in the anterior left frontal lobe.  Rightward midline shift with subfalcine herniation of the left cingulate gyrus. -Neurosurgery consulted who recommended IV Decadron 4 mg every 6 hours. -We will decrease IV Decadron to every 12 hours x2 to 3 days and subsequently discontinue per discussion with neurosurgery, Dr. Annette Stable. -Neurosurgery also recommending conservative treatment at this time allowing patient to recover from stroke before considering major surgical intervention to debulk and remove left frontal meningioma. -Continue GI prophylaxis with PPI.   -Per neurosurgery.  2.  Acute right paramedian pontine infarct -Noted on MRI brain. -Stroke work-up underway and stroke pathway ordered. -CT angiogram head and neck ordered with no LVO, multivessel multifocal stenosis.  Focal marked stenosis of the basilar proximal to superior cerebellar artery origins. 2D echo ordered. -Patient resumed on home regimen aspirin for secondary prophylaxis. -Permissive hypertension -Neurology consulted and feels stroke appears to be due to small vessel disease, also noted that patient seemed to have bradyphrenia and executive dysfunction which could be due to edema on the left frontal lobe. -2D echo with EF of 60 to 65%, NWMA, moderate asymmetric LVH of the basal septal, no embolic source noted. -  TSH within normal limits at 0.993, folate decreased at 5.8, vitamin B12 at 287. -LDL of 133. -EEG done. -Continue statin with goal LDL < 70. -Patient currently on aspirin and Plavix for dual antiplatelet  treatment. -Patient being followed by PT/OT/SLP. -Per neurology.  3 basilar artery stenosis -CT angiogram head and neck with marked stenosis of basilar artery proximal to SCA's. -Being followed by neurology who have consulted IR for further evaluation. -Patient seen in consultation by IR, Dr. Estanislado Pandy on 10/28/2021 and cerebral angiogram with angioplasty/possible stent placement within the basilar artery plan tentatively for Tuesday, 10/30/2021 under general anesthesia.   -Per IR no need to hold aspirin and Plavix.  4.  CKD stage IIIb -Stable.  5.  Acute thrombocytopenia -Patient with no overt bleeding. -Thrombocytopenia resolved. -Follow.  6.  History of endometrial cancer -Patient follows with OB/GYN oncology, Dr. Berline Lopes in the outpatient setting. -Outpatient follow-up.  7.  Diabetes mellitus type 2 -Hemoglobin A1c 7.0 (10/26/2021) -Continue to hold oral hypoglycemic agents. -CBG 255 this morning. -Elevated blood glucose likely secondary to steroids. -Monitor CBGs with steroid taper. -Increase Semglee to 34 units daily. -SSI.  8.  Hyperlipidemia -LDL of 33, goal LDL < 70 -Continue fenofibrate, rosuvastatin.   9.  Hypertension -Increase Avapro to 75 mg daily.  -Permissive hypertension secondary to acute CVA. -Avoid low blood pressure.  #10 UTI -Urinalysis concerning for UTI. -Urine cultures pending.   -Continue IV Rocephin to complete a 3-day course of treatment.  -IV Rocephin.   DVT prophylaxis: SCDs Code Status: Full Family Communication: Updated patient.  No family at bedside. Disposition: ? CIR  Status is: Inpatient The patient will require care spanning > 2 midnights and should be moved to inpatient because: Severity of illness   Consultants:  Neurosurgery: Dr. Ellene Route Neurology: [place] 10/26/2021 Interventional radiology: Dr.Deveshwar 10/28/2021  Procedures:  CT head 10/25/2021 CT angiogram head and neck 10/26/2021 pending MRI brain 10/25/2021 2D echo  10/26/2021 EEG 10/26/2021    Antimicrobials:  IV Rocephin 10/27/2021>>>>   Subjective: Sitting up in bed.  No chest pain.  No shortness of breath.  No abdominal pain.  Per patient no significant change with left-sided weakness.  Tolerating oral intake.    Objective: Vitals:   10/27/21 2304 10/28/21 0259 10/28/21 0706 10/28/21 1056  BP:  (!) 192/168 (!) 171/92 (!) 141/99  Pulse:  75 63 (!) 103  Resp:  '17 19 18  '$ Temp: 98.2 F (36.8 C) 97.8 F (36.6 C) (!) 97.2 F (36.2 C) 97.6 F (36.4 C)  TempSrc: Axillary Axillary Axillary Oral  SpO2:  98% 95% 99%  Weight:      Height:        Intake/Output Summary (Last 24 hours) at 10/28/2021 1431 Last data filed at 10/28/2021 1242 Gross per 24 hour  Intake 580 ml  Output 600 ml  Net -20 ml   Filed Weights   10/25/21 1531  Weight: 76.7 kg    Examination:  General exam: NAD.       Respiratory system: Lungs clear to auscultation bilaterally.  No wheezes, no crackles, no rhonchi.  Normal respiratory effort.  No wheezes, no crackles, no rhonchi. Cardiovascular system: RRR no murmurs rubs or gallops.  No JVD.  No lower extremity edema. Gastrointestinal system: Abdomen is soft, nontender, nondistended, positive bowel sounds.  No rebound.  No guarding.   Central nervous system: Alert and oriented.  Some left facial weakness.  Some dysarthric speech.  Left upper extremity weakness no focal neurological deficits. Extremities: Symmetric 5  x 5 power. Skin: No rashes, lesions or ulcers Psychiatry: Judgement and insight appear normal. Mood & affect appropriate.     Data Reviewed: I have personally reviewed following labs and imaging studies  CBC: Recent Labs  Lab 10/25/21 1540 10/26/21 0225 11-13-21 0234 10/28/21 0230  WBC 5.7 5.8 7.2 9.0  NEUTROABS 3.7 5.1 6.1 7.3  HGB 14.1 14.5 14.4 15.4*  HCT 43.1 42.6 41.4 45.0  MCV 91.7 89.9 88.1 88.6  PLT 146* 139* 151 259    Basic Metabolic Panel: Recent Labs  Lab 10/25/21 1540  10/26/21 0225 11/13/21 0234 10/28/21 0230  NA 135 138 137 138  K 3.8 3.9 4.2 4.2  CL 102 104 102 103  CO2 '24 22 22 24  '$ GLUCOSE 211* 251* 275* 184*  BUN 20 17 30* 39*  CREATININE 1.09* 0.96 1.30* 1.32*  CALCIUM 9.6 9.2 9.8 10.1  MG  --  1.8  --   --   PHOS  --  2.8  --   --     GFR: Estimated Creatinine Clearance: 33.7 mL/min (A) (by C-G formula based on SCr of 1.32 mg/dL (H)).  Liver Function Tests: Recent Labs  Lab 10/25/21 1540 10/26/21 0225  AST 12* 15  ALT 11 14  ALKPHOS 51 51  BILITOT 0.5 0.8  PROT 6.9 6.7  ALBUMIN 4.1 3.6    CBG: Recent Labs  Lab 11-13-2021 1130 11/13/21 1706 13-Nov-2021 2111 10/28/21 0743 10/28/21 1239  GLUCAP 355* 356* 368* 255* 314*     No results found for this or any previous visit (from the past 240 hour(s)).       Radiology Studies: EEG adult  Result Date: 11-13-2021 Derek Jack, MD     11/13/21  7:08 PM Routine EEG Report Cassandra Dodson is a 81 y.o. female with a history of stroke and meningioma who is undergoing an EEG to evaluate for seizures. Report: This EEG was acquired with electrodes placed according to the International 10-20 electrode system (including Fp1, Fp2, F3, F4, C3, C4, P3, P4, O1, O2, T3, T4, T5, T6, A1, A2, Fz, Cz, Pz). The following electrodes were missing or displaced: none. The occipital dominant rhythm was 6 Hz. This activity is reactive to stimulation. Drowsiness was manifested by background fragmentation; deeper stages of sleep were identified by K complexes and sleep spindles. There was focal slowing over the left frontal region. There were no interictal epileptiform discharges. There were no electrographic seizures identified. Photic stimulation and hyperventilation were not performed. Impression and clinical correlation: This EEG was obtained while awake and asleep and is abnormal due to mild diffuse slowing indicative of global cerebral dysfunction and superimposed focal slowing over the left frontal region  in the area of patient's known meningioma. Epileptiform abnormalities were not seen during this recording. Su Monks, MD [facility] [phone] If 7pm- 7am, please page neurology on call as listed in [place].        Scheduled Meds:  aspirin EC  81 mg Oral Daily   calcium carbonate  1,250 mg Oral Q breakfast   clopidogrel  75 mg Oral Daily   dexamethasone (DECADRON) injection  4 mg Intravenous Q8H   fenofibrate  160 mg Oral QPC breakfast   folic acid  1 mg Oral Daily   insulin aspart  0-15 Units Subcutaneous TID WC   insulin aspart  0-5 Units Subcutaneous QHS   insulin glargine-yfgn  34 Units Subcutaneous Daily   irbesartan  75 mg Oral Daily   pantoprazole  40 mg Oral Daily   rosuvastatin  20 mg Oral Daily   Continuous Infusions:  cefTRIAXone (ROCEPHIN)  IV 2 g (10/28/21 1005)     LOS: 2 days    Time spent: 40 minutes    Irine Seal, MD [facility]   To contact the attending provider between 7A-7P or the covering provider during after hours 7P-7A, please log into the web site [email or web address] and access using universal [place] password for that web site. If you do not have the password, please call the hospital operator.  10/28/2021, 2:31 PM

## 2021-10-28 NOTE — PMR Pre-admission (Signed)
PMR Admission Coordinator Pre-Admission Assessment  Patient: Cassandra Dodson is an 81 y.o., female MRN: [id number] DOB: [birth year] Height: 5' 5"  (165.1 cm) Weight: 76.7 kg  Insurance Information HMO: yes    PPO:      PCP:      IPA:      80/20:      OTHER:  PRIMARY: [employer] Medicare      Policy#: [id number]      Subscriber: Pt  CM Name:       Phone#: [phone]    Fax#: [phone] Pt. Approved for CIR on 10/28/21 for admit 7/10 for 7 days with update due 9/74 Pre-Cert#: [id number]       Employer:  Benefits:  Phone #:      Name:  Irene Shipper Date: 05/23/2021 - still active Deductible: $0 (does not have deductible) OOP Max: $3,600 ($20 met) CIR: $295/day co-pay for days 1-5, $0/day co-pay for days 6+ SNF: $0.00 Copayment per day for days 1-20; $196.00 Copayment per day for days 21-39; $0.00 Copayment per day for days 40-100 for Medicare-covered care/maximum 100 days/benefit period Outpatient: $20/visit co-pay/visit Home Health:  100% coverage; limited by medical necessity DME: 80% coverage; 20% co-insurance Providers: in network  SECONDARY:       Policy#:      Phone#:   Development worker, community:       Phone#:   The Engineer, petroleum" for patients in Inpatient Rehabilitation Facilities with attached "Privacy Act [place] Records" was provided and verbally reviewed with: Patient  Emergency Contact Information Contact Information     Name Relation Home Work Mobile   [place] Son [phone]  [phone]   Sut[place],Alicia Relative [phone]  [phone]       Current Medical History  Patient Admitting Diagnosis: CVA, Brain Mass History of Present Illness:  Cassandra Dodson is an 81 year old right-handed female history of endometrial cancer, hypertension, type 2 diabetes mellitus, hypothyroidism, CKD stage III, quit smoking 19 years ago, history of left hip and bilateral knee replacement.  Per chart review patient lives alone.  1 level apartment.  She does have a son in  the area.  Used a rolling walker prior to admission.  She does have an aide 2-3 days a week.  Presented 10/25/2021 to [facility] with acute onset of slurred speech and left-sided weakness x2 days.  CT/MRI showed large acute right paramedian pontine infarction.  Anterior cranial fossa meningioma with large amount of surrounding vasogenic edema in the inferior left frontal lobe.  Rightward midline shift with subfalcine herniation of the left cingulate gyrus.  CT angiogram head and neck showed no large vessel occlusion.  Plaque at the ICA origin causing less than 50% stenosis.  Marked stenosis of BA proximal to SCA's.  Given high risk of recurrent stroke with devastating outcome patient underwent basilar artery stenting assisted angioplasty 7/13 per interventional radiology.  Initially placed on intravenous heparin since transition to aspirin 81 mg daily and Plavix 75 mg daily x3 to 6 months.  Neurosurgery follow-up in regards to left frontal large meningioma Dr. Ellene Route plan for possible surgical resection 8-12 weeks.  Patient was placed on Decadron therapy.  She was cleared to begin Lovenox for DVT prophylaxis 11/02/2021.  Blood pressure monitored initially maintained on Cleviprex.  She is currently on a dysphagia #3 nectar thick liquid diet.  Therapy evaluations completed due to patient decreased functional mobility was admitted for a comprehensive rehab program.   Complete NIHSS TOTAL: 2  Patient's medical record [facility] has been reviewed by the rehabilitation admission coordinator and physician.  Past Medical History  Past Medical History:  Diagnosis Date   Arthritis    CKD (chronic kidney disease), stage III ([place])    patient denies   Depression    Diabetes mellitus without complication ([place])    Difficult intravenous access    Endometrial cancer ([place])    GERD (gastroesophageal reflux disease)    Headache    History of radiation therapy 11/04/2019-12/01/2019    Endometrial HDR; Dr. Gery Pray   Hypertension    Hypothyroidism    Neuromuscular disorder ([place])    neuropathy in feet   Osteoarthritis    PMB (postmenopausal bleeding)    PONV (postoperative nausea and vomiting)    severe nausea and vomiting after knee replacement 06-2014, did ok with 2018 knee replacement   Urinary frequency    Wears glasses     Has the patient had major surgery during 100 days prior to admission? No  Family History   family history includes Hypertension in her father; Pancreatic cancer in her mother; Stroke in her father.  Current Medications  Current Facility-Administered Medications:    ALPRAZolam (XANAX) tablet 0.25-0.5 mg, 0.25-0.5 mg, Oral, QHS PRN, Eugenie Filler, MD, 0.5 mg at 10/27/21 2108   aspirin EC tablet 81 mg, 81 mg, Oral, Daily, Eugenie Filler, MD, 81 mg at 10/28/21 0956   calcium carbonate (OS-CAL - dosed in mg of elemental calcium) tablet 1,250 mg, 1,250 mg, Oral, Q breakfast, Eugenie Filler, MD, 1,250 mg at 10/28/21 0954   cefTRIAXone (ROCEPHIN) 2 g in sodium chloride 0.9 % 100 mL IVPB, 2 g, Intravenous, Q24H, Eugenie Filler, MD, Last Rate: 200 mL/hr at 10/28/21 1005, 2 g at 10/28/21 1005   clopidogrel (PLAVIX) tablet 75 mg, 75 mg, Oral, Daily, Rosalin Hawking, MD, 75 mg at 10/28/21 0957   dexamethasone (DECADRON) injection 4 mg, 4 mg, Intravenous, Q6H, Hall, Carole N, DO, 4 mg at 10/28/21 0511   fenofibrate tablet 160 mg, 160 mg, Oral, QPC breakfast, Eugenie Filler, MD, 160 mg at 16/10/96 0454   folic acid (FOLVITE) tablet 1 mg, 1 mg, Oral, Daily, Eugenie Filler, MD, 1 mg at 10/28/21 0956   hydrALAZINE (APRESOLINE) injection 10 mg, 10 mg, Intravenous, Q6H PRN, Eugenie Filler, MD, 10 mg at 10/26/21 0810   insulin aspart (novoLOG) injection 0-15 Units, 0-15 Units, Subcutaneous, TID WC, Eugenie Filler, MD, 8 Units at 10/28/21 0959   insulin aspart (novoLOG) injection 0-5 Units, 0-5 Units, Subcutaneous, QHS, Eugenie Filler, MD, 5 Units at 10/27/21 2114   insulin glargine-yfgn (SEMGLEE) injection 34 Units, 34 Units, Subcutaneous, Daily, Eugenie Filler, MD, 34 Units at 10/28/21 1022   irbesartan (AVAPRO) tablet 75 mg, 75 mg, Oral, Daily, Eugenie Filler, MD, 75 mg at 10/28/21 0956   pantoprazole (PROTONIX) EC tablet 40 mg, 40 mg, Oral, Daily, Hall, Carole N, DO, 40 mg at 10/28/21 0956   rosuvastatin (CRESTOR) tablet 20 mg, 20 mg, Oral, Daily, Rosalin Hawking, MD, 20 mg at 10/28/21 0981  Patients Current Diet:  Diet Order             Diet NPO time specified Except for: Sips with Meds  Diet effective ____           DIET DYS 3 Room service appropriate? Yes; Fluid consistency: Nectar Thick  Diet effective now  Precautions / Restrictions Precautions Precautions: Fall Precaution Comments: urinary incontenance Restrictions Weight Bearing Restrictions: No   Has the patient had 2 or more falls or a fall with injury in the past year? Yes  Prior Activity Level Community (5-7x/wk): Pt. was active in the community PTA  Prior Functional Level Self Care: Did the patient need help bathing, dressing, using the toilet or eating? Independent  Indoor Mobility: Did the patient need assistance with walking from room to room (with or without device)? Independent  Stairs: Did the patient need assistance with internal or external stairs (with or without device)? Independent  Functional Cognition: Did the patient need help planning regular tasks such as shopping or remembering to take medications? Independent  Patient Information Are you of Hispanic, Latino/a,or Spanish origin?: A. No, not of Hispanic, Latino/a, or Spanish origin What is your race?: A. White Do you need or want an interpreter to communicate with a doctor or health care staff?: 0. No  Patient's Response To:  Health Literacy and Transportation Is the patient able to respond to health literacy and transportation needs?:  Yes Health Literacy - How often do you need to have someone help you when you read instructions, pamphlets, or other written material from your doctor or pharmacy?: Never In the past 12 months, has lack of transportation kept you from medical appointments or from getting medications?: Yes In the past 12 months, has lack of transportation kept you from meetings, work, or from getting things needed for daily living?: No  Home Assistive Devices / Equipment Home Equipment: Conservation officer, nature (2 wheels), [employer] - single point, Civil engineer, contracting  Prior Device Use: Indicate devices/aids used by the patient prior to current illness, exacerbation or injury? None of the above  Current Functional Level Cognition  Arousal/Alertness: Awake/alert Overall Cognitive Status: History of cognitive impairments - at baseline Difficult to assess due to: Impaired communication Current Attention Level: Sustained Orientation Level: Oriented X4 Following Commands: Follows one step commands with increased time, Follows one step commands inconsistently Safety/Judgement: Decreased awareness of deficits General Comments: increased time and cues to complete instructions, at times pt needing repeated cues, max cues for safety and hand placement Attention: Sustained Sustained Attention: Impaired Sustained Attention Impairment: Verbal basic Memory: Impaired Memory Impairment: Retrieval deficit, Decreased short term memory Decreased Short Term Memory: Verbal basic, Functional basic Awareness: Impaired Awareness Impairment: Intellectual impairment Problem Solving: Appears intact Executive Function: Reasoning Reasoning: Impaired Reasoning Impairment: Verbal complex Behaviors: Impulsive Safety/Judgment: Impaired    Extremity Assessment (includes Sensation/Coordination)  Upper Extremity Assessment: LUE deficits/detail LUE Deficits / Details: Using LUE for functional tasks, however note dropping objects she distracted;clumsy use  of hand; unaware L hand not holding RW at times LUE Sensation: decreased light touch, decreased proprioception LUE Coordination: decreased fine motor, decreased gross motor  Lower Extremity Assessment: Defer to PT evaluation LLE Deficits / Details: pt grossly 3/5 throughout but very difficult to accurately assess secondary to cognition    ADLs  Overall ADL's : Needs assistance/impaired Eating/Feeding: Set up Grooming: Supervision/safety, Set up, Standing Upper Body Bathing: Supervision/ safety, Set up, Sitting Lower Body Bathing: Moderate assistance, Sit to/from stand Upper Body Dressing : Minimal assistance Lower Body Dressing: Moderate assistance, Sit to/from stand Toilet Transfer: Minimal assistance, Ambulation, Rolling walker (2 wheels) (poor use of RW-  unaware L hand falling off RW at times; pushing RW about 2 ft in front of her) Toileting- Clothing Manipulation and Hygiene: Maximal assistance (urinary incontinence) Functional mobility during ADLs: Minimal assistance, Cueing for safety,  Cueing for sequencing, Rolling walker (2 wheels)    Mobility  Overal bed mobility: Needs Assistance Bed Mobility: Supine to Sit Supine to sit: HOB elevated, Mod assist Sit to supine: Supervision General bed mobility comments: modA to complete with pt having difficulty scooting to EOB or elevating trunk    Transfers  Overall transfer level: Needs assistance Equipment used: Rolling walker (2 wheels) Transfers: Sit to/from Stand Sit to Stand: Min assist, Mod assist General transfer comment: increased time and effort with strong posterior lean, needing modA initially but improved to minA with reps. repeated cues for hand positioning    Ambulation / Gait / Stairs / Wheelchair Mobility  Ambulation/Gait Ambulation/Gait assistance: Mod assist, Min assist Gait Distance (Feet): 5 Feet (+ 5 ft) Assistive device: Rolling walker (2 wheels) Gait Pattern/deviations: Decreased stride length, Narrow base of  support, Trunk flexed General Gait Details: pt initially unable to complete static marches despite modA of 1, needing frequent seated rest break. after x3 attempts, pt was able to complete lateral steps to recliner with minA and minA to manage RW. then minA to manage forwards steps from recliner with chair follow Gait velocity: decreased Pre-gait activities: standing marches with BUE support and max cues for posture and positioning    Posture / Balance Balance Overall balance assessment: Needs assistance Sitting-balance support: Feet supported Sitting balance-Leahy Scale: Fair Standing balance support: During functional activity, Bilateral upper extremity supported, Single extremity supported Standing balance-Leahy Scale: Poor Standing balance comment: dependent on BUE support and up to Garrett needs/care consideration Skin Ecchymosis/redness on BUEs and neck, Redness on Coccyx   Previous Home Environment (from acute therapy documentation) Living Arrangements: Alone  Lives With: Alone Available Help at Discharge: Family, Friend(s), Available PRN/intermittently Type of Home: Apartment Home Layout: One level Home Access: Level entry Bathroom Shower/Tub: Chiropodist: Standard Bathroom Accessibility: Yes How Accessible: Accessible via walker  Discharge Living Setting Plans for Discharge Living Setting: Patient's home Type of Home at Discharge: House Discharge Home Layout: One level Discharge Home Access: Level entry Discharge Bathroom Shower/Tub: Tub/shower unit Discharge Bathroom Toilet: Standard Discharge Bathroom Accessibility: Yes How Accessible: Accessible via walker  Social/Family/Support Systems Patient Roles: Other (Comment) Contact Information: [name] Anticipated Caregiver: [phone] Ability/Limitations of Caregiver: Can provide min A Caregiver Availability: 24/7 Discharge Plan Discussed with Primary Caregiver: Yes Is Caregiver In  Agreement with Plan?: Yes  Goals Patient/Family Goal for Rehab: PT/OT/SLP Supervision to Min A Expected length of stay: 14-16 days Pt/Family Agrees to Admission and willing to participate: Yes Program Orientation Provided & Reviewed with Pt/Caregiver Including Roles  & Responsibilities: Yes  Decrease burden of Care through IP rehab admission: n/a   Possible need for SNF placement upon discharge: not anticipated  Patient Condition: I have reviewed medical records from [facility], spoken with CM, and patient and son. I met with patient at the bedside for inpatient rehabilitation assessment.  Patient will benefit from ongoing PT, OT, and SLP, can actively participate in 3 hours of therapy a day 5 days of the week, and can make measurable gains during the admission.  Patient will also benefit from the coordinated team approach during an Inpatient Acute Rehabilitation admission.  The patient will receive intensive therapy as well as Rehabilitation physician, nursing, social worker, and care management interventions.  Due to safety, skin/wound care, disease management, medication administration, pain management, and patient education the patient requires 24 hour a day rehabilitation nursing.  The patient is currently Min  A  with mobility and basic ADLs.  Discharge setting and therapy post discharge at home with home health is anticipated.  Patient has agreed to participate in the Acute Inpatient Rehabilitation Program and will admit today.  Preadmission Screen Completed By:  Genella Mech, 10/28/2021 10:33 AM ______________________________________________________________________   Discussed status with Dr. Letta Pate  on 11/05/21 at 48 and received approval for admission today.  Admission Coordinator:  Genella Mech, CCC-SLP, time 1011/Date 11/05/21   Assessment/Plan: Diagnosis: right paramedian pontine infarct, left frontal meningioma Does the need for close, 24 hr/day Medical  supervision in concert with the patient's rehab needs make it unreasonable for this patient to be served in a less intensive setting? Yes Co-Morbidities requiring supervision/potential complications: endometrial ca, htn, dm, ckd 3b Due to bladder management, bowel management, safety, skin/wound care, disease management, medication administration, pain management, and patient education, does the patient require 24 hr/day rehab nursing? Yes Does the patient require coordinated care of a physician, rehab nurse, PT, OT, and SLP to address physical and functional deficits in the context of the above medical diagnosis(es)? Yes Addressing deficits in the following areas: balance, endurance, locomotion, strength, transferring, bowel/bladder control, bathing, dressing, feeding, grooming, toileting, cognition, speech, swallowing, and psychosocial support Can the patient actively participate in an intensive therapy program of at least 3 hrs of therapy 5 days a week? Yes The potential for patient to make measurable gains while on inpatient rehab is excellent Anticipated functional outcomes upon discharge from inpatient rehab: supervision PT, supervision OT, supervision SLP Estimated rehab length of stay to reach the above functional goals is: 7-10 days Anticipated discharge destination: Home 10. Overall Rehab/Functional Prognosis: excellent   MD Signature:

## 2021-10-28 NOTE — Progress Notes (Signed)
Speech Language Pathology Treatment: Dysphagia  Patient Details Name: Cassandra Dodson MRN: [id number] DOB: May 29, 1940 Today's Date: 10/28/2021 Time: 4481-8563 SLP Time Calculation (min) (ACUTE ONLY): 15 min  Assessment / Plan / Recommendation Clinical Impression  Pt was seen during lunch for dysphagia treatment. Pt's son and RN reported that the pt has been tolerating the meals well without s/s of aspiration. Pt also denied any signs of aspiration, but stated that she did not enjoy the nectar thick milk this morning. Pt consumed a meal of beef pot roast, rice, green beans, and applesauce. No s/sx of aspiration were noted with solids or liquids. A wet vocal quality was noted intermittently during the session, but this was also noted during the FEES and was attributed to pharyngeal secretions. Mastication and oral clearance were WFL despite her watching the muted TV during the meal. Symptoms of pharyngeal residue were not observed. Pt's current diet of dysphagia 3 solids and nectar thick liquids will be continued with observance of swallowing precautions. Cognitive-linguistic and motor speech tasks were deferred to allow pt to eat. SLP will continue to follow pt.     HPI HPI: Pt is an 81 y/o female admitted secondary to slurred speech and L sided weakness. 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 PMH including but not limited to HTN, DM, CKD3, GERD and endometrial cancer.      SLP Plan  Continue with current plan of care      Recommendations for follow up therapy are one component of a multi-disciplinary discharge planning process, led by the attending physician.  Recommendations may be updated based on patient status, additional functional criteria and insurance  authorization.    Recommendations  Diet recommendations: Dysphagia 3 (mechanical soft);Nectar-thick liquid Liquids provided via: Cup;No straw Medication Administration: Whole meds with puree Supervision: Full supervision/cueing for compensatory strategies Compensations: Slow rate;Small sips/bites Postural Changes and/or Swallow Maneuvers: Seated upright 90 degrees                Follow Up Recommendations: Acute inpatient rehab (3hours/day) Assistance recommended at discharge: Frequent or constant Supervision/Assistance SLP Visit Diagnosis: Dysphagia, oropharyngeal phase (R13.12) Plan: Continue with current plan of care          Timouthy Gilardi I. Hardin Negus, [place], [place] Office number [phone] Pager [place]  10/28/2021, 12:01 PM

## 2021-10-28 NOTE — Consult Note (Signed)
Chief Complaint: Patient was seen in consultation today for basilar artery stenosis  Referring Physician(s): Rosalin Hawking, MD  Supervising Physician: Luanne Bras  Patient Status: [facility] - In-pt  History of Present Illness: Cassandra Dodson is a 81 y.o. female with a past medical history significant for depression, GERD, endometrial cancer s/p radiation, DM, CKD III, HTN who presented to [facility] ED 10/25/21 with complaints of slurred speech, unsteadiness with standing and left sided weakness x 2 days. She underwent CT head w/o contrast that day which showed:  1. 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 Frontal horn of the left lateral ventricle is partially effaced. 2. 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 3. Atherosclerosis. 4. Mild chronic ethmoid sinusitis.   Follow up MRI brain w/ and w/o showed:  1. Large, acute right paramedian pontine infarct. 2. Anterior cranial fossa meningioma with large amount of surrounding vasogenic edema in the inferior left frontal lobe. Rightward midline shift with subfalcine herniation of the left cingulate gyrus.  Neurology was consulted who noted her stroke was likely due to small vessel disease and she was admitted for further evaluation and management. She was started on ASA 81 mg however Plavix was held pending neurosurgical consultation. She was seen by Dr. Ellene Route ([place]) on 7/7 who recommended conservative management of her recent stroke, allow time to recover from the stroke and then discuss possible elective surgery to the remove the left frontal meningioma in a couple months.  CTA head/neck w/ w/o was obtained on 7/7 and noted:  No large vessel occlusion.   Plaque at the ICA origins causes less than 50% stenosis.    Multifocal intracranial atherosclerosis with stenoses. Of note, there is focal marked stenosis of the basilar proximal to the superior cerebellar artery origins.  NIR has been consulted for possible cerebral angiogram with angioplasty/possibly stent placement within the basilar artery stenosis.   Patient seen at bedside this morning with Dr. 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 She's unable to state whether she had any episodes of double vision as well. She reports last fall was about 3-4 weeks ago "when I fell and hit my head 3 times." She states the dizzy spells started during the pandemic. 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   We reviewed the basic possible NIR procedure and indications for it with patient today, she does seem to understand that she has had a stroke and that this procedure would be performed in hopes to prevent further strokes. She states that she would like to do whatever her son [name] thinks is best because "he tells you like it is and I think that's what I need."  Dr. Estanislado Pandy spoke with patient's son Merial Moritz today via phone today, reviewed procedure indications, risks - especially the 3-5% chance of stroke (particularly in the reticular system), benefits and alternatives in detail. [name] states he would  like to do whatever is possible to give his mom a better quality of life and is agreeable to proceed with cerebral angiogram and possible intervention.   Past Medical History:  Diagnosis Date   Arthritis    CKD (chronic kidney disease), stage III ([place])    patient denies   Depression     Diabetes mellitus without complication ([place])    Difficult intravenous access    Endometrial cancer ([place])    GERD (gastroesophageal reflux disease)    Headache    History of radiation therapy 11/04/2019-12/01/2019   Endometrial HDR; Dr. Gery Pray   Hypertension    Hypothyroidism    Neuromuscular disorder ([place])    neuropathy in feet   Osteoarthritis    PMB (postmenopausal bleeding)    PONV (postoperative nausea and vomiting)    severe nausea and vomiting after knee replacement 06-2014, did ok with 2018 knee replacement   Urinary frequency    Wears glasses     Past Surgical History:  Procedure Laterality Date   BREAST SURGERY     cyst removed   CESAREAN SECTION     DILATION AND CURETTAGE OF UTERUS N/A 06/24/2019   Procedure: DILATATION AND CURETTAGE;  Surgeon: Lafonda Mosses, MD;  Location: [place];  Service: Gynecology;  Laterality: N/A;   FRACTURE SURGERY     right knee   INTRAUTERINE DEVICE (IUD) INSERTION N/A 06/24/2019   Procedure: INTRAUTERINE DEVICE (IUD) INSERTION MIRENA;  Surgeon: Lafonda Mosses, MD;  Location: [facility];  Service: Gynecology;  Laterality: N/A;   IRRIGATION AND DEBRIDEMENT SEBACEOUS CYST     JOINT REPLACEMENT Right    right   LYMPH NODE DISSECTION N/A 09/14/2019   Procedure: LYMPH NODE DISSECTION;  Surgeon: Lafonda Mosses, MD;  Location: [facility];  Service: Gynecology;  Laterality: N/A;   ROBOTIC ASSISTED TOTAL HYSTERECTOMY WITH BILATERAL SALPINGO OOPHERECTOMY Bilateral 09/14/2019   Procedure: XI ROBOTIC ASSISTED TOTAL HYSTERECTOMY WITH BILATERAL SALPINGO OOPHORECTOMY;  Surgeon: Lafonda Mosses, MD;  Location: [facility];  Service: Gynecology;  Laterality: Bilateral;   SENTINEL NODE BIOPSY N/A 09/14/2019   Procedure: SENTINEL NODE BIOPSY;  Surgeon: Lafonda Mosses, MD;  Location: [facility];  Service: Gynecology;  Laterality: N/A;   teeth extration     TONSILLECTOMY     TOTAL HIP ARTHROPLASTY Right 02/23/2015    Procedure: RIGHT TOTAL HIP ARTHROPLASTY ANTERIOR APPROACH;  Surgeon: Leandrew Koyanagi, MD;  Location: [place];  Service: Orthopedics;  Laterality: Right;   TOTAL KNEE ARTHROPLASTY Left 06/27/2016   Procedure: LEFT TOTAL KNEE ARTHROPLASTY;  Surgeon: Leandrew Koyanagi, MD;  Location: [place];  Service: Orthopedics;  Laterality: Left;    Allergies: Anesthetics, amide and Ether  Medications: Prior to Admission medications   Medication Sig Start Date End Date Taking? Authorizing Provider  ALPRAZolam Duanne Moron) 0.5 MG tablet Take 0.25-0.5 mg by mouth at bedtime as needed for sleep. 09/21/21  Yes [provider]  aspirin EC 81 MG tablet Take 81 mg by mouth daily. Swallow whole.   Yes [provider]  calcium carbonate (OSCAL) 1500 (600 Ca) MG TABS tablet Take 1,500 mg by mouth daily with breakfast.   Yes [provider]  fenofibrate 160 MG tablet Take 160 mg by mouth daily after breakfast.  05/12/16  Yes [provider]  HUMALOG MIX 75/25 KWIKPEN (75-25) 100 UNIT/ML Kwikpen Inject 30 Units into the skin in the morning and at bedtime. 06/08/16  Yes [provider]  metFORMIN (GLUCOPHAGE)  1000 MG tablet Take 1,000 mg by mouth 2 (two) times daily.  01/21/15  Yes [provider]  telmisartan (MICARDIS) 80 MG tablet Take 80 mg by mouth daily after breakfast.  06/01/19  Yes [provider]     Family History  Problem Relation Age of Onset   Pancreatic cancer Mother    Stroke Father    Hypertension Father    Colon cancer Neg Hx    Breast cancer Neg Hx    Ovarian cancer Neg Hx    Endometrial cancer Neg Hx    Prostate cancer Neg Hx     Social History   Socioeconomic History   Marital status: Widowed    Spouse name: Not on file   Number of children: Not on file   Years of education: Not on file   Highest education level: Not on file  Occupational History   Not on file  Tobacco Use   Smoking status: Former    Packs/day: 1.50    Years: 45.00     Total pack years: 67.50    Types: Cigarettes    Quit date: 04/22/2002    Years since quitting: 19.5   Smokeless tobacco: Never  Vaping Use   Vaping Use: Never used  Substance and Sexual Activity   Alcohol use: Yes    Comment: rarely   Drug use: No   Sexual activity: Not Currently  Other Topics Concern   Not on file  Social History Narrative   Not on file   Social Determinants of Health   Financial Resource Strain: Not on file  Food Insecurity: Not on file  Transportation Needs: Not on file  Physical Activity: Not on file  Stress: Not on file  Social Connections: Not on file     Review of Systems: A 12 point ROS discussed and pertinent positives are indicated in the HPI above.  All other systems are negative.  Review of Systems  Vital Signs: BP (!) 171/92   Pulse 63   Temp (!) 97.2 F (36.2 C) (Axillary)   Resp 19   Ht '5\' 5"'$  (1.651 m)   Wt 169 lb (76.7 kg)   SpO2 95%   BMI 28.12 kg/m   Physical Exam Vitals and nursing note reviewed.  Constitutional:      General: She is not in acute distress.    Comments: Hard of hearing, pleasant, tries to answer all questions asked  HENT:     Head: Normocephalic.     Comments: Reddened scalp    Mouth/Throat:     Mouth: Mucous membranes are moist.     Pharynx: Oropharynx is clear. No oropharyngeal exudate or posterior oropharyngeal erythema.     Comments: (+) edentulous Cardiovascular:     Rate and Rhythm: Normal rate and regular rhythm.  Pulmonary:     Effort: Pulmonary effort is normal.     Breath sounds: Normal breath sounds.  Abdominal:     Palpations: Abdomen is soft.     Tenderness: There is no abdominal tenderness.  Skin:    General: Skin is warm and dry.    Alert, awake, and oriented to self and "hospital" - she is unable to state the day of the week ("Well I know it's not Monday"), date or year ("well if it's not Monday then I don't know") but is able to state that the month is July. Speech is dysarthric and  comprehension is intact although patient requires questions to be repeated multiple times. PERRL bilaterally EOMs  without nystagmus or subjective diplopia. Visual fields grossly in tact - patient asked to read the clock on the wall, which she states she can see but when asked to state the time she is unable to answer.  No facial asymmetry. Tongue midline Motor power - mild LUE/LLE weakness, full RUE/RLE. Right hand dominant Mild pronator drift to the left Fine motor and coordination grossly in tact, mild zig-zagging on the left during finger to nose. Patient needs significant prompting and examples to complete this portion of the exam. Gait - not assessed Romberg - not assessed Heel to toe - not assessed Distal pulses - not assessed  MD Evaluation Airway: WNL Heart: WNL Abdomen: WNL Chest/ Lungs: WNL ASA  Classification: Per MD or Designee   Imaging: EEG adult  Result Date: 10/27/2021 Derek Jack, MD     10/27/2021  7:08 PM Routine EEG Report Cassandra Dodson is a 81 y.o. female with a history of stroke and meningioma who is undergoing an EEG to evaluate for seizures. Report: This EEG was acquired with electrodes placed according to the International 10-20 electrode system (including Fp1, Fp2, F3, F4, C3, C4, P3, P4, O1, O2, T3, T4, T5, T6, A1, A2, Fz, Cz, Pz). The following electrodes were missing or displaced: none. The occipital dominant rhythm was 6 Hz. This activity is reactive to stimulation. Drowsiness was manifested by background fragmentation; deeper stages of sleep were identified by K complexes and sleep spindles. There was focal slowing over the left frontal region. There were no interictal epileptiform discharges. There were no electrographic seizures identified. Photic stimulation and hyperventilation were not performed. Impression and clinical correlation: This EEG was obtained while awake and asleep and is abnormal due to mild diffuse slowing indicative of global cerebral  dysfunction and superimposed focal slowing over the left frontal region in the area of patient's known meningioma. Epileptiform abnormalities were not seen during this recording. Su Monks, MD [facility] [phone] If 7pm- 7am, please page neurology on call as listed in [place].   ECHOCARDIOGRAM COMPLETE  Result Date: 10/26/2021    ECHOCARDIOGRAM REPORT   Patient Name:   Cassandra Dodson Date of Exam: 10/26/2021 Medical Rec #:  [id number]    Height:       65.0 in Accession #:    [id number]   Weight:       169.0 lb Date of Birth:  1940-05-28    BSA:          1.841 m Patient Age:    52 years     BP:           168/102 mmHg Patient Gender: F            HR:           82 bpm. Exam Location:  Inpatient Procedure: 2D Echo, Cardiac Doppler and Color Doppler Indications:    Stroke I63.9  History:        Patient has no prior history of Echocardiogram examinations.                 Risk Factors:Hypertension and Diabetes. Chronic kidney disease.  Sonographer:    Darlina Sicilian RDCS Referring Phys: [id number] DANIEL V THOMPSON  Sonographer Comments: Suboptimal parasternal window. Parasternal measurements attempted. IMPRESSIONS  1. Left ventricular ejection fraction, by estimation, is 60 to 65%. The left ventricle has normal function. The left ventricle has no regional wall motion abnormalities. There is moderate asymmetric left ventricular hypertrophy of the basal-septal segment. Left ventricular diastolic  parameters are indeterminate.  2. Right ventricular systolic function is normal. The right ventricular size is normal. Tricuspid regurgitation signal is inadequate for assessing PA pressure.  3. The mitral valve is normal in structure. No evidence of mitral valve regurgitation. No evidence of mitral stenosis.  4. The aortic valve was not well visualized. Aortic valve regurgitation is not visualized. No aortic stenosis is present.  5. The inferior vena cava is normal in size with greater than 50% respiratory variability,  suggesting right atrial pressure of 3 mmHg. FINDINGS  Left Ventricle: Left ventricular ejection fraction, by estimation, is 60 to 65%. The left ventricle has normal function. The left ventricle has no regional wall motion abnormalities. The left ventricular internal cavity size was small. There is moderate  asymmetric left ventricular hypertrophy of the basal-septal segment. Left ventricular diastolic parameters are indeterminate. Right Ventricle: The right ventricular size is normal. No increase in right ventricular wall thickness. Right ventricular systolic function is normal. Tricuspid regurgitation signal is inadequate for assessing PA pressure. Left Atrium: Left atrial size was normal in size. Right Atrium: Right atrial size was normal in size. Pericardium: There is no evidence of pericardial effusion. Mitral Valve: The mitral valve is normal in structure. No evidence of mitral valve regurgitation. No evidence of mitral valve stenosis. Tricuspid Valve: The tricuspid valve is normal in structure. Tricuspid valve regurgitation is trivial. Aortic Valve: The aortic valve was not well visualized. Aortic valve regurgitation is not visualized. No aortic stenosis is present. Pulmonic Valve: The pulmonic valve was not well visualized. Pulmonic valve regurgitation is not visualized. Aorta: The aortic root is normal in size and structure. Venous: The inferior vena cava is normal in size with greater than 50% respiratory variability, suggesting right atrial pressure of 3 mmHg. IAS/Shunts: The interatrial septum was not well visualized.  LEFT VENTRICLE PLAX 2D LVIDd:         3.55 cm   Diastology LVIDs:         2.30 cm   LV e' medial:    4.12 cm/s LV PW:         1.25 cm   LV E/e' medial:  19.8 LV IVS:        1.30 cm   LV e' lateral:   7.53 cm/s LVOT diam:     1.90 cm   LV E/e' lateral: 10.8 LV SV:         61 LV SV Index:   33 LVOT Area:     2.84 cm  RIGHT VENTRICLE RV S prime:     19.50 cm/s TAPSE (M-mode): 2.0 cm LEFT  ATRIUM             Index        RIGHT ATRIUM           Index LA diam:        3.40 cm 1.85 cm/m   RA Area:     12.60 cm LA Vol (A2C):   43.7 ml 23.73 ml/m  RA Volume:   23.40 ml  12.71 ml/m LA Vol (A4C):   49.9 ml 27.10 ml/m LA Biplane Vol: 47.6 ml 25.85 ml/m  AORTIC VALVE LVOT Vmax:   88.80 cm/s LVOT Vmean:  64.300 cm/s LVOT VTI:    0.214 m  AORTA Ao Root diam: 2.90 cm MITRAL VALVE MV Area (PHT): 4.19 cm    SHUNTS MV Decel Time: 181 msec    Systemic VTI:  0.21 m MV E velocity: 81.40 cm/s  Systemic Diam: 1.90  cm MV A velocity: 72.40 cm/s MV E/A ratio:  1.12 Oswaldo Milian MD Electronically signed by Oswaldo Milian MD Signature Date/Time: 10/26/2021/5:29:43 PM    Final    CT ANGIO HEAD W OR WO CONTRAST  Result Date: 10/26/2021 CLINICAL DATA:  Stroke, follow up EXAM: CT ANGIOGRAPHY HEAD AND NECK TECHNIQUE: Multidetector CT imaging of the head and neck was performed using the standard protocol during bolus administration of intravenous contrast. Multiplanar CT image reconstructions and MIPs were obtained to evaluate the vascular anatomy. Carotid stenosis measurements (when applicable) are obtained utilizing NASCET criteria, using the distal internal carotid diameter as the denominator. RADIATION DOSE REDUCTION: This exam was performed according to the departmental dose-optimization program which includes automated exposure control, adjustment of the mA and/or kV according to patient size and/or use of iterative reconstruction technique. CONTRAST:  72m OMNIPAQUE IOHEXOL 350 MG/ML SOLN COMPARISON:  None Available. FINDINGS: CTA NECK Aortic arch: Calcified plaque.  Great vessel origins are patent. Right carotid system: Patent. Calcified plaque at the bifurcation and proximal internal carotid with less than 50% stenosis. Left carotid system: Patent. Noncalcified plaque at the common carotid origin with minimal stenosis. Calcified plaque at the bifurcation and proximal internal carotid with less than 50%  stenosis. Vertebral arteries: Patent. Plaque at the left vertebral origin causes mild stenosis. Skeleton: Degenerative changes of the included spine. Other neck: Asymmetric prominence of the left palatine tonsil without discrete mass. Upper chest: Emphysema. Review of the MIP images confirms the above findings CTA HEAD Anterior circulation: Intracranial internal carotid arteries are patent with calcified plaque causing up to moderate stenosis. Anterior and middle cerebral arteries are patent. There is displacement of the anterior cerebral arteries by the falcine meningioma. High-grade stenosis of proximal left M2 MCA branch. High-grade stenosis of left A2 ACA. Posterior circulation: Intracranial vertebral arteries are patent with atherosclerotic irregularity. There is mild stenosis on the right and moderate stenosis on the left. Basilar artery is patent with atherosclerotic irregularity. There is up to marked stenosis distally. Left posterior communicating artery is present with fetal origin of the left posterior cerebral artery. The posterior cerebral arteries are patent with atherosclerotic irregularity. Moderate stenosis at the right P1-P2 junction. Venous sinuses: Patent as allowed by contrast bolus timing. Review of the MIP images confirms the above findings IMPRESSION: No large vessel occlusion. Plaque at the ICA origins causes less than 50% stenosis. Multifocal intracranial atherosclerosis with stenoses. Of note, there is focal marked stenosis of the basilar proximal to the superior cerebellar artery origins. Electronically Signed   By: PMacy MisM.D.   On: 10/26/2021 10:35   CT ANGIO NECK W OR WO CONTRAST  Result Date: 10/26/2021 CLINICAL DATA:  Stroke, follow up EXAM: CT ANGIOGRAPHY HEAD AND NECK TECHNIQUE: Multidetector CT imaging of the head and neck was performed using the standard protocol during bolus administration of intravenous contrast. Multiplanar CT image reconstructions and MIPs were  obtained to evaluate the vascular anatomy. Carotid stenosis measurements (when applicable) are obtained utilizing NASCET criteria, using the distal internal carotid diameter as the denominator. RADIATION DOSE REDUCTION: This exam was performed according to the departmental dose-optimization program which includes automated exposure control, adjustment of the mA and/or kV according to patient size and/or use of iterative reconstruction technique. CONTRAST:  761mOMNIPAQUE IOHEXOL 350 MG/ML SOLN COMPARISON:  None Available. FINDINGS: CTA NECK Aortic arch: Calcified plaque.  Great vessel origins are patent. Right carotid system: Patent. Calcified plaque at the bifurcation and proximal internal carotid with less than 50% stenosis.  Left carotid system: Patent. Noncalcified plaque at the common carotid origin with minimal stenosis. Calcified plaque at the bifurcation and proximal internal carotid with less than 50% stenosis. Vertebral arteries: Patent. Plaque at the left vertebral origin causes mild stenosis. Skeleton: Degenerative changes of the included spine. Other neck: Asymmetric prominence of the left palatine tonsil without discrete mass. Upper chest: Emphysema. Review of the MIP images confirms the above findings CTA HEAD Anterior circulation: Intracranial internal carotid arteries are patent with calcified plaque causing up to moderate stenosis. Anterior and middle cerebral arteries are patent. There is displacement of the anterior cerebral arteries by the falcine meningioma. High-grade stenosis of proximal left M2 MCA branch. High-grade stenosis of left A2 ACA. Posterior circulation: Intracranial vertebral arteries are patent with atherosclerotic irregularity. There is mild stenosis on the right and moderate stenosis on the left. Basilar artery is patent with atherosclerotic irregularity. There is up to marked stenosis distally. Left posterior communicating artery is present with fetal origin of the left  posterior cerebral artery. The posterior cerebral arteries are patent with atherosclerotic irregularity. Moderate stenosis at the right P1-P2 junction. Venous sinuses: Patent as allowed by contrast bolus timing. Review of the MIP images confirms the above findings IMPRESSION: No large vessel occlusion. Plaque at the ICA origins causes less than 50% stenosis. Multifocal intracranial atherosclerosis with stenoses. Of note, there is focal marked stenosis of the basilar proximal to the superior cerebellar artery origins. Electronically Signed   By: Macy Mis M.D.   On: 10/26/2021 10:35   MR Brain W and Wo Contrast  Result Date: 10/25/2021 CLINICAL DATA:  Meningioma EXAM: MRI HEAD WITHOUT AND WITH CONTRAST TECHNIQUE: Multiplanar, multiecho pulse sequences of the brain and surrounding structures were obtained without and with intravenous contrast. CONTRAST:  27m GADAVIST GADOBUTROL 1 MMOL/ML IV SOLN COMPARISON:  None Available. FINDINGS: Brain: There is a large, acute right paramedian pontine infarct. Punctate focus of acute ischemia in the left parietal white matter. No acute or chronic hemorrhage. Anterior cranial fossa meningioma measures 3.9 x 3.4 x 4.0 cm. There is a large amount of surrounding vasogenic edema in the inferior left frontal lobe. There is rightward mass effect on the right cingulate gyrus. There is subfalcine herniation of the left cingulate gyrus. Vascular: Major flow voids are preserved. Skull and upper cervical spine: Normal calvarium and skull base. Visualized upper cervical spine and soft tissues are normal. Sinuses/Orbits:No paranasal sinus fluid levels or advanced mucosal thickening. No mastoid or middle ear effusion. Normal orbits. IMPRESSION: 1. Large, acute right paramedian pontine infarct. 2. Anterior cranial fossa meningioma with large amount of surrounding vasogenic edema in the inferior left frontal lobe. Rightward midline shift with subfalcine herniation of the left cingulate  gyrus. Electronically Signed   By: KUlyses JarredM.D.   On: 10/25/2021 23:40   CT HEAD WO CONTRAST  Result Date: 10/25/2021 CLINICAL DATA:  Slurred speech and increased confusion. History of hypertension, diabetes, and endometrial cancer. EXAM: CT HEAD WITHOUT CONTRAST TECHNIQUE: Contiguous axial images were obtained from the base of the skull through the vertex without intravenous contrast. RADIATION DOSE REDUCTION: This exam was performed according to the departmental dose-optimization program which includes automated exposure control, adjustment of the mA and/or kV according to patient size and/or use of iterative reconstruction technique. COMPARISON:  None Available. FINDINGS: Brain: Sharply defined low-density encephalomalacia compatible with small remote infarct of the right inferior cerebellum, image 8 series 2. A high density 5.0 by 3.8 by 3.6 cm mass primarily centered in the  left anterior cranial fossa but abuts the planum sphenoidale and anterior falx, with 2.0 cm of localized left-to-right midline shift and with extensive vasogenic edema in the left frontal lobe and a small amount of edema tracking into the left temporal lobe. Effacement of part of the frontal horn left lateral ventricle. This mass is highly likely to be a meningioma. No overt hydrocephalus. No intracranial hemorrhage or definite findings of acute CVA. Vascular: There is atherosclerotic calcification of the cavernous carotid arteries bilaterally. Skull: Unremarkable Sinuses/Orbits: Mild chronic ethmoid sinusitis. Other: No supplemental non-categorized findings. IMPRESSION: 1. 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 Frontal horn of the left lateral ventricle is partially effaced. 2. 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  3. Atherosclerosis. 4. Mild chronic ethmoid sinusitis. Electronically Signed   By: Van Clines M.D.   On: 10/25/2021 16:27    Labs:  CBC: Recent Labs    10/25/21 1540 10/26/21 0225 10/27/21 0234 10/28/21 0230  WBC 5.7 5.8 7.2 9.0  HGB 14.1 14.5 14.4 15.4*  HCT 43.1 42.6 41.4 45.0  PLT 146* 139* 151 159    COAGS: Recent Labs    10/25/21 1540  INR 1.1  APTT 25    BMP: Recent Labs    10/25/21 1540 10/26/21 0225 10/27/21 0234 10/28/21 0230  NA 135 138 137 138  K 3.8 3.9 4.2 4.2  CL 102 104 102 103  CO2 '24 22 22 24  '$ GLUCOSE 211* 251* 275* 184*  BUN 20 17 30* 39*  CALCIUM 9.6 9.2 9.8 10.1  CREATININE 1.09* 0.96 1.30* 1.32*  GFRNONAA 51* 59* 41* 40*    LIVER FUNCTION TESTS: Recent Labs    10/25/21 1540 10/26/21 0225  BILITOT 0.5 0.8  AST 12* 15  ALT 11 14  ALKPHOS 51 51  PROT 6.9 6.7  ALBUMIN 4.1 3.6    TUMOR MARKERS: No results for input(s): "AFPTM", "CEA", "CA199", "CHROMGRNA" in the last 8760 hours.  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 NIR has been consulted to discuss possible intervention of basilar artery stenosis.  Patient seen and examined by Dr. Estanislado Pandy, procedure discussed with patient and son (via phone) today - they are both agreeable to proceed with cerebral angiogram with angioplasty/possible stent placement within the basilar artery.   Procedure tentatively planned for Tuesday 7/11 with general anesthesia pending anesthesia availability (IR will coordinate with anesthesia). Patient to be NPO at midnight on 7/11, CBC/INR/BMP AM of 7/11, ASA and Plavix do not need to be held for this procedure.  Risks and benefits of cerebral arteriogram with intervention were discussed with the patient's son Chaley Castellanos including,  but not limited to bleeding, infection, vascular injury, contrast induced renal failure, stroke, reperfusion hemorrhage, or even death.  This interventional procedure involves the use of X-rays and because of the nature of the planned procedure, it is possible that we will have prolonged use of X-ray fluoroscopy. Potential radiation risks to you include (but are not limited to) the following: - A slightly elevated risk for cancer  several years later in life. This risk is typically less than 0.5%  percent. This risk is low in comparison to the normal incidence of human cancer, which is 33% for women and 50% for men according to the [place]. - Radiation induced injury can include skin redness, resembling a rash, tissue breakdown / ulcers and hair loss (which can be temporary or permanent).  The likelihood of either of these occurring depends on the difficulty of the procedure and whether you are sensitive to radiation due to previous procedures, disease, or genetic conditions.  IF your procedure requires a prolonged use of radiation, you will be notified and given written instructions for further action.  It is your responsibility to monitor the irradiated area for the 2 weeks following the procedure and to notify your physician if you are concerned that you have suffered a radiation induced injury.    All of the patient's son's questions were answered, patient's son is agreeable to proceed.  Consent signed and in chart.  Thank you for this interesting consult.  I greatly enjoyed meeting MOSELLA KASA and look forward to participating in their care.  A copy of this report was sent to the requesting provider on this date.  Electronically Signed: Joaquim Nam, PA-C 10/28/2021, 8:37 AM   I spent a total of 95 Miinutes in face to face in clinical consultation, greater than 50% of which was counseling/coordinating care for basilar artery stenosis.

## 2021-10-29 DIAGNOSIS — E782 Mixed hyperlipidemia: Secondary | ICD-10-CM | POA: Diagnosis not present

## 2021-10-29 DIAGNOSIS — I651 Occlusion and stenosis of basilar artery: Secondary | ICD-10-CM | POA: Diagnosis not present

## 2021-10-29 DIAGNOSIS — R900 Intracranial space-occupying lesion found on diagnostic imaging of central nervous system: Secondary | ICD-10-CM | POA: Diagnosis not present

## 2021-10-29 DIAGNOSIS — N1832 Chronic kidney disease, stage 3b: Secondary | ICD-10-CM | POA: Diagnosis not present

## 2021-10-29 DIAGNOSIS — G9389 Other specified disorders of brain: Secondary | ICD-10-CM | POA: Diagnosis not present

## 2021-10-29 DIAGNOSIS — I6322 Cerebral infarction due to unspecified occlusion or stenosis of basilar arteries: Secondary | ICD-10-CM | POA: Diagnosis not present

## 2021-10-29 LAB — BASIC METABOLIC PANEL
Anion gap: 14 (ref 5–15)
BUN: 44 mg/dL — ABNORMAL HIGH (ref 8–23)
CO2: 24 mmol/L (ref 22–32)
Calcium: 9.6 mg/dL (ref 8.9–10.3)
Chloride: 100 mmol/L (ref 98–111)
Creatinine, Ser: 1.43 mg/dL — ABNORMAL HIGH (ref 0.44–1.00)
GFR, Estimated: 37 mL/min — ABNORMAL LOW (ref >60)
Glucose, Bld: 228 mg/dL — ABNORMAL HIGH (ref 70–99)
Potassium: 4.7 mmol/L (ref 3.5–5.1)
Sodium: 138 mmol/L (ref 135–145)

## 2021-10-29 LAB — CBC
HCT: 42.8 % (ref 36.0–46.0)
Hemoglobin: 14.6 g/dL (ref 12.0–15.0)
MCH: 30.7 pg (ref 26.0–34.0)
MCHC: 34.1 g/dL (ref 30.0–36.0)
MCV: 89.9 fL (ref 80.0–100.0)
Platelets: 164 10*3/uL (ref 150–400)
RBC: 4.76 MIL/uL (ref 3.87–5.11)
RDW: 14.4 % (ref 11.5–15.5)
WBC: 8.2 10*3/uL (ref 4.0–10.5)
nRBC: 0 % (ref 0.0–0.2)

## 2021-10-29 LAB — URINE CULTURE: Culture: >=100000 — AB

## 2021-10-29 LAB — GLUCOSE, CAPILLARY
Glucose-Capillary: 212 mg/dL — ABNORMAL HIGH (ref 70–99)
Glucose-Capillary: 216 mg/dL — ABNORMAL HIGH (ref 70–99)
Glucose-Capillary: 193 mg/dL — ABNORMAL HIGH (ref 70–99)
Glucose-Capillary: 260 mg/dL — ABNORMAL HIGH (ref 70–99)

## 2021-10-29 MED ORDER — SODIUM CHLORIDE 0.9 % IV SOLN
1.0000 g | INTRAVENOUS | Status: AC
  Administered 2021-10-30 – 2021-10-31 (×2): 1 g via INTRAVENOUS
  Filled 2021-10-29 (×2): qty 10

## 2021-10-29 MED ORDER — INSULIN GLARGINE-YFGN 100 UNIT/ML ~~LOC~~ SOLN
38.0000 [IU] | Freq: Every day | SUBCUTANEOUS | Status: DC
  Administered 2021-10-29 – 2021-11-02 (×5): 38 [IU] via SUBCUTANEOUS
  Filled 2021-10-29 (×6): qty 0.38

## 2021-10-29 MED ORDER — IRBESARTAN 150 MG PO TABS
150.0000 mg | ORAL_TABLET | Freq: Every day | ORAL | Status: DC
  Administered 2021-10-29 – 2021-11-01 (×4): 150 mg via ORAL
  Filled 2021-10-29 (×3): qty 1

## 2021-10-29 NOTE — Care Management Important Message (Signed)
Important Message  Patient Details  Name: Cassandra Dodson MRN: [id number] Date of Birth: 09-04-1940   Medicare Important Message Given:  Yes     Orbie Pyo 10/29/2021, 3:29 PM

## 2021-10-29 NOTE — Plan of Care (Signed)
  Problem: Education: Goal: Knowledge of General Education information will improve Description: Including pain rating scale, medication(s)/side effects and non-pharmacologic comfort measures Outcome: Progressing   Problem: Health Behavior/Discharge Planning: Goal: Ability to manage health-related needs will improve Outcome: Progressing   Problem: Clinical Measurements: Goal: Ability to maintain clinical measurements within normal limits will improve Outcome: Progressing Goal: Will remain free from infection Outcome: Progressing Goal: Diagnostic test results will improve Outcome: Progressing Goal: Respiratory complications will improve Outcome: Progressing Goal: Cardiovascular complication will be avoided Outcome: Progressing   Problem: Activity: Goal: Risk for activity intolerance will decrease Outcome: Progressing   Problem: Nutrition: Goal: Adequate nutrition will be maintained Outcome: Progressing   Problem: Coping: Goal: Level of anxiety will decrease Outcome: Progressing   Problem: Elimination: Goal: Will not experience complications related to bowel motility Outcome: Progressing Goal: Will not experience complications related to urinary retention Outcome: Progressing   Problem: Pain Managment: Goal: General experience of comfort will improve Outcome: Progressing   Problem: Safety: Goal: Ability to remain free from injury will improve Outcome: Progressing   Problem: Skin Integrity: Goal: Risk for impaired skin integrity will decrease Outcome: Progressing   Problem: Education: Goal: Ability to describe self-care measures that may prevent or decrease complications (Diabetes Survival Skills Education) will improve Outcome: Progressing Goal: Individualized Educational Video(s) Outcome: Progressing   Problem: Coping: Goal: Ability to adjust to condition or change in health will improve Outcome: Progressing   Problem: Fluid Volume: Goal: Ability to  maintain a balanced intake and output will improve Outcome: Progressing   Problem: Health Behavior/Discharge Planning: Goal: Ability to identify and utilize available resources and services will improve Outcome: Progressing Goal: Ability to manage health-related needs will improve Outcome: Progressing   Problem: Metabolic: Goal: Ability to maintain appropriate glucose levels will improve Outcome: Progressing   Problem: Nutritional: Goal: Maintenance of adequate nutrition will improve Outcome: Progressing Goal: Progress toward achieving an optimal weight will improve Outcome: Progressing   Problem: Skin Integrity: Goal: Risk for impaired skin integrity will decrease Outcome: Progressing   Problem: Tissue Perfusion: Goal: Adequacy of tissue perfusion will improve Outcome: Progressing   Problem: Education: Goal: Knowledge of disease or condition will improve Outcome: Progressing Goal: Knowledge of secondary prevention will improve (SELECT ALL) Outcome: Progressing Goal: Knowledge of patient specific risk factors will improve (INDIVIDUALIZE FOR PATIENT) Outcome: Progressing Goal: Individualized Educational Video(s) Outcome: Progressing   Problem: Coping: Goal: Will verbalize positive feelings about self Outcome: Progressing Goal: Will identify appropriate support needs Outcome: Progressing   Problem: Health Behavior/Discharge Planning: Goal: Ability to manage health-related needs will improve Outcome: Progressing   Problem: Self-Care: Goal: Ability to participate in self-care as condition permits will improve Outcome: Progressing Goal: Verbalization of feelings and concerns over difficulty with self-care will improve Outcome: Progressing Goal: Ability to communicate needs accurately will improve Outcome: Progressing   Problem: Nutrition: Goal: Risk of aspiration will decrease Outcome: Progressing Goal: Dietary intake will improve Outcome: Progressing    Problem: Intracerebral Hemorrhage Tissue Perfusion: Goal: Complications of Intracerebral Hemorrhage will be minimized Outcome: Progressing   Problem: Ischemic Stroke/TIA Tissue Perfusion: Goal: Complications of ischemic stroke/TIA will be minimized Outcome: Progressing

## 2021-10-29 NOTE — Progress Notes (Signed)
STROKE TEAM PROGRESS NOTE   INTERVAL HISTORY No family is at the bedside. Her son just left, I called him on the phone and updated him about pt condition. Pt sitting in chair, no complains, still has mild left facial droop and left UE mild pronator drift but otherwise doing well. Plan for potential BA stenting / angioplasty in am.   Vitals:   10/29/21 0335 10/29/21 0400 10/29/21 0724 10/29/21 1144  BP: (!) 183/90 (!) 171/82 (!) 154/56   Pulse: 76     Resp: 18   20  Temp: 97.9 F (36.6 C)  97.8 F (36.6 C) 97.9 F (36.6 C)  TempSrc: Oral  Oral Oral  SpO2: 99%     Weight:      Height:       CBC:  Recent Labs  Lab 10/27/21 0234 10/28/21 0230 10/29/21 0342  WBC 7.2 9.0 8.2  NEUTROABS 6.1 7.3  --   HGB 14.4 15.4* 14.6  HCT 41.4 45.0 42.8  MCV 88.1 88.6 89.9  PLT 151 159 542   Basic Metabolic Panel:  Recent Labs  Lab 10/26/21 0225 10/27/21 0234 10/28/21 0230 10/29/21 0342  NA 138   < > 138 138  K 3.9   < > 4.2 4.7  CL 104   < > 103 100  CO2 22   < > 24 24  GLUCOSE 251*   < > 184* 228*  BUN 17   < > 39* 44*  CREATININE 0.96   < > 1.32* 1.43*  CALCIUM 9.2   < > 10.1 9.6  MG 1.8  --   --   --   PHOS 2.8  --   --   --    < > = values in this interval not displayed.   Lipid Panel:  Recent Labs  Lab 10/27/21 0234  CHOL 198  TRIG 59  HDL 53  CHOLHDL 3.7  VLDL 12  LDLCALC 133*   HgbA1c:  Recent Labs  Lab 10/26/21 0225  HGBA1C 7.0*   Urine Drug Screen: No results for input(s): "LABOPIA", "COCAINSCRNUR", "LABBENZ", "AMPHETMU", "THCU", "LABBARB" in the last 168 hours.  Alcohol Level  Recent Labs  Lab 10/25/21 1540  ETH <10    IMAGING past 24 hours No results found.  PHYSICAL EXAM General:  Alert, well-nourished, well-developed patient in no acute distress Respiratory:  Regular, unlabored respirations on room air  NEURO:  awake, alert, eyes open, orientated to age, place, month and people, but not to year. No aphasia, fluent language, following all  simple commands, mild dysarthria. Able to name and repeat. No gaze palsy, tracking bilaterally, visual field full.  Mild left facial droop. Tongue midline. RUE 5/5, LUE 4+/5 with mild pronator drift. Bilaterally LEs 4/5 proximal and distal. Sensation symmetrical bilaterally, b/l FTN intact but slow, gait not tested.    ASSESSMENT/PLAN Ms. IRIE DOWSON is a 81 y.o. female with history of  HTN, DM, CKD3, GERD and endometrial cancer  presenting with slurred speech for two days and left leg weakness for one day.  Patient's son states that she first seemed to be having problems on 7/4 when she had some difficulty with ambulation, then developed slurred speech and dragging of the left leg while walking.  [place] shows a left paracentral meningioma with vasogenic edema, and MRI brain shows a right paramedian pontine stroke. Patient denies any prior known history of meningioma or brain problems.  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  Therapy recommendations:  CIR Disposition:  pending  Basilar artery stenosis CTA head & neck marked stenosis of BA proximal to SCAs Per son, pt baseline walks with walker due to left hip and b/l knee replacement but lives independently PTA Now with mild left facial droop and left UE drift post stroke Given high risk of recurrent stroke with  devastating outcome, basilar artery angioplasty or stent planned for tomorrow IR Dr. Estanislado Pandy on board  Left frontal large meningioma CT head 5.0 cm intracranial mass along anterior falx, likely to be a meningioma with vasogenic edema and 2 cm localized midline shift MRI left anterior cranial fossa meningioma with vasogenic edema and MLS Neurosurgery on board Plan for possible surgical resection in 8-12 weeks first On decardron  Hypertension Home meds:  telmisartan 80 mg daily Stable but fluctuate On low dose avapro Avoid low BP Long-term BP goal normotensive  Hyperlipidemia Home meds:  fenofibrate 160 mg daily, resumed in hospital LDL 133, goal < 70 Add rosuvastatin 20 mg daily  Continue statin at discharge  Diabetes type II Uncontrolled Home meds:  metformin 1000 mg BID HgbA1c 7.0, goal < 7.0 CBGs SSI On insulin now Hyperglycemia improved, especially on decardron now Close PCP follow up for better DM control in the setting of steroid use DM coordinator consult requested.  Dysphagia Speech on board Now on nectar thick liquid Aspiration precaution  Other Stroke Risk Factors Advanced Age >/= 21  Former cigarette smoker  Other Active Problems Hx of left hip and b/l Knee replacement   Hospital day # 3  I had discussion with son over the phone, updated pt current condition, treatment plan and potential prognosis, and answered all the questions. He expressed understanding and appreciation.    Rosalin Hawking, MD PhD Stroke Neurology 10/29/2021 1:55 PM    To contact Stroke Continuity provider, please refer to [email or web address] After hours, contact General Neurology

## 2021-10-29 NOTE — Progress Notes (Signed)
Physical Therapy Treatment Patient Details Name: Cassandra Dodson MRN: [id number] DOB: 13-Nov-1940 Today's Date: 10/29/2021   History of Present Illness Pt is an 81 y/o female admitted secondary to slurred speech and L sided weakness. 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 PMH including but not limited to HTN, DM, CKD3, GERD and endometrial cancer.    PT Comments    The pt was agreeable to session, received soiled in bed and therefore beginning of session focused on bed mobility and rolling to allow for cleaning. The pt was able to complete rolling with minG and use of bed rails, but needed modA to complete transition to sitting at EOB. She also needed min-modA to complete sit-stand transfers, improved use of UE with max cues. The pt ambulates with anywhere from minA to maxA with use of RW, minA when walking straight without obstacles, pt still maintains narrow BOS and needs cues for RW positioning. With any turning or navigating, needing up to maxA to maintain upright, increased narrow BOS, scissoring steps, and pt unable to correct without max cues and maxA to manage pt and RW. Continue to recommend acute inpatient rehab at d/c  to maximize functional recovery.    Recommendations for follow up therapy are one component of a multi-disciplinary discharge planning process, led by the attending physician.  Recommendations may be updated based on patient status, additional functional criteria and insurance authorization.  Follow Up Recommendations  Acute inpatient rehab (3hours/day)     Assistance Recommended at Discharge Frequent or constant Supervision/Assistance  Patient can return home with the following Assistance with cooking/housework;Direct supervision/assist  for medications management;Direct supervision/assist for financial management;Assist for transportation;Help with stairs or ramp for entrance;A lot of help with walking and/or transfers;A little help with bathing/dressing/bathroom   Equipment Recommendations  None recommended by PT    Recommendations for Other Services       Precautions / Restrictions Precautions Precautions: Fall Precaution Comments: urinary incontenance, wears briefs Restrictions Weight Bearing Restrictions: No     Mobility  Bed Mobility Overal bed mobility: Needs Assistance Bed Mobility: Supine to Sit, Rolling Rolling: Min guard   Supine to sit: Mod assist     General bed mobility comments: minG to roll with use of bed rail. pt then able to complete transition to sitting with modA to elevate trunk and modA to scoot to EOB    Transfers Overall transfer level: Needs assistance Equipment used: Rolling walker (2 wheels) Transfers: Sit to/from Stand Sit to Stand: Min assist, Mod assist           General transfer comment: increased time and effort with strong posterior lean, needing modA initially but improved to minA with reps and when using appropriate hand placement    Ambulation/Gait Ambulation/Gait assistance: Min assist, Mod assist, Max assist Gait Distance (Feet): 5 Feet (+ 20 ft) Assistive device: Rolling walker (2 wheels) Gait Pattern/deviations: Decreased stride length, Narrow base of support, Trunk flexed, Step-to pattern, Decreased step length - right, Decreased step length - left, Shuffle, Scissoring Gait velocity: decreased Gait velocity interpretation: <1.31 ft/sec, indicative of household ambulator   General Gait Details: minA when walking straight without obstacles, pt still maintains narrow BOS and needs cues for RW positioning. With any turning or navigating, needing up to maxA to maintain upright,  increased narrow BOS, scissoring steps, and pt unable to correct without max cues and  maxA to manage pt and RW.    Modified Rankin (Stroke Patients Only) Modified Rankin (Stroke Patients Only) Pre-Morbid Rankin Score: Moderately severe disability Modified Rankin: Moderately severe disability     Balance Overall balance assessment: Needs assistance Sitting-balance support: Feet supported Sitting balance-Leahy Scale: Fair     Standing balance support: During functional activity, Bilateral upper extremity supported, Single extremity supported Standing balance-Leahy Scale: Poor Standing balance comment: dependent on BUE support on RW, able to ambulate straight without challenge with minA, LOB with increased scissoring steps and needing maxA with turning.             High level balance activites: Direction changes, Turns High Level Balance Comments: up to maxA to maintain upright            Cognition Arousal/Alertness: Awake/alert Behavior During Therapy: Flat affect Overall Cognitive Status: History of cognitive impairments - at baseline Area of Impairment: Orientation, Attention, Memory, Following commands, Safety/judgement, Awareness, Problem solving                 Orientation Level: Disoriented to, Time Current Attention Level: Sustained Memory: Decreased short-term memory Following Commands: Follows one step commands with increased time, Follows one step commands inconsistently Safety/Judgement: Decreased awareness of deficits Awareness: Intellectual Problem Solving: Slow processing, Decreased initiation, Difficulty sequencing, Requires verbal cues, Requires tactile cues General Comments: increased time and cues to complete instructions, at times pt needing repeated cues, max cues for safety and hand placement. poor awareness of deficits/safety with movement        Exercises      General Comments General comments (skin integrity, edema, etc.): HR to 118, BP stable but elevated      Pertinent Vitals/Pain Pain Assessment Pain Assessment:  No/denies pain     PT Goals (current goals can now be found in the care plan section) Acute Rehab PT Goals Patient Stated Goal: eager to mobilize, son wants her to go to rehab PT Goal Formulation: With patient/family Time For Goal Achievement: 11/09/21 Potential to Achieve Goals: Good Progress towards PT goals: Progressing toward goals    Frequency    Min 4X/week      PT Plan Current plan remains appropriate       AM-PAC PT "6 Clicks" Mobility   Outcome Measure  Help needed turning from your back to your side while in a flat bed without using bedrails?: A Little Help needed moving from lying on your back to sitting on the side of a flat bed without using bedrails?: A Lot Help needed moving to and from a bed to a chair (including a wheelchair)?: A Lot Help needed standing up from a chair using your arms (e.g., wheelchair or bedside chair)?: A Little Help needed to walk in hospital room?: A Lot Help needed climbing 3-5 steps with a railing? : A Lot 6 Click Score: 14    End of Session Equipment Utilized During Treatment: Gait belt Activity Tolerance: Patient tolerated treatment well Patient left: with call bell/phone within reach;in chair;with chair alarm set;with nursing/sitter in room Nurse Communication: Mobility status (needs new purewick) PT Visit Diagnosis: Other abnormalities of gait and mobility (R26.89);Other symptoms and signs involving the nervous system (Q59.563)     Time: 8756-4332 PT Time Calculation (min) (ACUTE ONLY): 40 min  Charges:  $Gait Training: 8-22 mins $Therapeutic Activity: 23-37 mins  [place], PT, DPT   Acute Rehabilitation Department   Sandra Cockayne 10/29/2021, 9:30 AM

## 2021-10-29 NOTE — Progress Notes (Signed)
PROGRESS NOTE    Cassandra Dodson  FBP:[id number] DOB: July 17, 1940 DOA: 10/25/2021 PCP: Shon Baton, MD    Chief Complaint  Patient presents with   Weakness   Slurred Speech    Brief Narrative:  HPI per Dr. Camillia Herter is a 81 y.o. female with medical history significant for endometrial cancer, essential hypertension, type 2 diabetes, GERD, hypothyroidism, CKD 3B who initially presented to [facility] ED with complaints of slurred speech, left-sided weakness x2 days.  Associated with mild confusion.  No headache.  Noncontrast head CT done in the ED revealed 5.0 cm long axis intracranial mass highly likely to be a meningioma associated with 2.0 cm localized left to right midline shift with extensive vasogenic edema in the left frontal lobe.  EDP discussed the case with neurosurgery Dr. Ellene Route who recommended MRI brain with and without contrast and to add IV Decadron for the vasogenic edema.   Upon assessment at [facility], the patient is alert, follows commands, denies any headaches.   ED Course: Tmax 98.6.  BP 170/93, pulse 68, respiratory rate 29, saturation 96% on room air.  Lab studies significant for glucose 211, BUN 20, creatinine 1.09 GFR 51.  CBC essentially unremarkable except for platelet count of 146.    Assessment & Plan:   Principal Problem:   Brain mass Active Problems:   CVA (cerebral vascular accident) ([place])   Stage 3b chronic kidney disease (HCC)   Thrombocytopenia (HCC)   History of endometrial cancer   UTI (urinary tract infection)   Intracranial mass   Hyperlipidemia   Type 2 diabetes mellitus with stage 3b chronic kidney disease, without long-term current use of insulin ([place])   Basilar artery stenosis   #1 intracranial mass, seen on CT -Patient presented with left-sided weakness and slurred speech. -Noncontrast CT done in the ED with 5.0 cm long axis left paracentral intracranial mass along the planum sphenoidale and anterior falx, highly likely to  be a meningioma associated with 2.0 cm of localized left right midline shift and with extensive vasogenic edema in the left frontal lobe.  Frontal horn of the left lateral ventricle partially effaced.  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 -MRI brain done as recommended per neurosurgery with large acute right paramedian pontine infarct, anterior cranial fossa meningioma with large amount of surrounding vasogenic edema in the anterior left frontal lobe.  Rightward midline shift with subfalcine herniation of the left cingulate gyrus. -Neurosurgery consulted who recommended IV Decadron 4 mg every 6 hours. -Continue IV Decadron 4 mg every 12 hours x2 to 3 days and then 2 mg every 12 hours x2 to 3 days then subsequently discontinue per discussion with neurosurgery, Dr. Annette Stable. -Neurosurgery also recommending conservative treatment at this time allowing patient to recover from stroke before considering major surgical intervention to debulk and remove left frontal meningioma. -Continue GI prophylaxis with PPI.   -Per neurosurgery.  2.  Acute right paramedian pontine infarct -Noted on MRI brain. -Stroke work-up underway and stroke pathway ordered. -CT angiogram head and neck ordered with no LVO, multivessel multifocal stenosis.  Focal marked stenosis of the basilar proximal to superior cerebellar artery origins. 2D echo ordered. -Patient was on aspirin prior to admission. -Permissive hypertension -Neurology consulted and feels stroke appears to be due to small vessel disease, also noted that patient seemed to have bradyphrenia and executive dysfunction which could be due to edema on the left frontal lobe. -2D echo with EF of 60 to 65%, NWMA, moderate asymmetric LVH  of the basal septal, no embolic source noted. -TSH within normal limits at 0.993, folate decreased at 5.8, vitamin B12 at 287. -LDL of 133. -EEG done. -Continue statin with goal LDL < 70. -Patient currently on aspirin and Plavix for dual  antiplatelet treatment per neurology recommendations. -Patient being followed by PT/OT/SLP. -Per neurology.  3 basilar artery stenosis -CT angiogram head and neck with marked stenosis of basilar artery proximal to SCA's. -Being followed by neurology who have consulted IR for further evaluation. -Patient seen in consultation by IR, Dr. Estanislado Pandy on 10/28/2021 and cerebral angiogram with angioplasty/possible stent placement within the basilar artery plan tentatively for Tuesday, 10/30/2021 under general anesthesia.   -Per IR no need to hold aspirin and Plavix. -Continue IV Rocephin.  4.  CKD stage IIIb -Stable.  5.  Acute thrombocytopenia -Patient with no overt bleeding. -Thrombocytopenia resolved. -Follow.  6.  History of endometrial cancer -Patient follows with OB/GYN oncology, Dr. Berline Lopes in the outpatient setting. -Outpatient follow-up.  7.  Diabetes mellitus type 2 -Hemoglobin A1c 7.0 (10/26/2021) -Continue to hold oral hypoglycemic agents. -Elevated CBGs secondary to steroids. -CBG 212 this morning. -Increase Semglee to 38 units daily. -Monitor CBGs with steroid taper. -SSI.  8.  Hyperlipidemia -LDL of 33, goal LDL < 70 -Continue fenofibrate, rosuvastatin.   9.  Hypertension -Increase Avapro to 150 mg daily.  -Permissive hypertension secondary to acute CVA. -Avoid low blood pressure.  #10 E. coli UTI -Urinalysis concerning for UTI. -Urine cultures > 100,000 colonies of E. coli pansensitive.  -Continue IV Rocephin to treat for total of 5 days as patient to undergo cerebral angiogram with possible angioplasty versus stent placement.   -Follow.   DVT prophylaxis: SCDs Code Status: Full Family Communication: Updated patient.  No family at bedside. Disposition: ? CIR  Status is: Inpatient The patient will require care spanning > 2 midnights and should be moved to inpatient because: Severity of illness   Consultants:  Neurosurgery: Dr. Ellene Route Neurology: [facility]  10/26/2021 Interventional radiology: Dr.Deveshwar 10/28/2021  Procedures:  CT head 10/25/2021 CT angiogram head and neck 10/26/2021 pending MRI brain 10/25/2021 2D echo 10/26/2021 EEG 10/26/2021    Antimicrobials:  IV Rocephin 10/27/2021>>>>   Subjective: Patient sitting up in chair.  No chest pain.  No shortness of breath.  No abdominal pain.  Tolerating oral intake.    Objective: Vitals:   10/29/21 0335 10/29/21 0400 10/29/21 0724 10/29/21 1144  BP: (!) 183/90 (!) 171/82 (!) 154/56   Pulse: 76     Resp: 18   20  Temp: 97.9 F (36.6 C)  97.8 F (36.6 C) 97.9 F (36.6 C)  TempSrc: Oral  Oral Oral  SpO2: 99%     Weight:      Height:        Intake/Output Summary (Last 24 hours) at 10/29/2021 1220 Last data filed at 10/29/2021 0900 Gross per 24 hour  Intake 480 ml  Output 700 ml  Net -220 ml    Filed Weights   10/25/21 1531  Weight: 76.7 kg    Examination:  General exam: NAD.       Respiratory system: CTA B.  No wheezes, no crackles, no rhonchi.  Normal respiratory effort.  Speaking in full sentences.  Cardiovascular system: Regular rate rhythm no murmurs rubs or gallops.  No JVD.  No lower extremity edema.   Gastrointestinal system: Abdomen soft, nontender, nondistended, positive bowel sounds.  No rebound.  No guarding.   Central nervous system: Alert and oriented. Left facial weakness.  Dysarthric speech improving.  Left upper extremity weakness improving.  Extremities: Symmetric 5 x 5 power. Skin: No rashes, lesions or ulcers Psychiatry: Judgement and insight appear normal. Mood & affect appropriate.     Data Reviewed: I have personally reviewed following labs and imaging studies  CBC: Recent Labs  Lab 10/25/21 1540 10/26/21 0225 10/27/21 0234 10/28/21 0230 10/29/21 0342  WBC 5.7 5.8 7.2 9.0 8.2  NEUTROABS 3.7 5.1 6.1 7.3  --   HGB 14.1 14.5 14.4 15.4* 14.6  HCT 43.1 42.6 41.4 45.0 42.8  MCV 91.7 89.9 88.1 88.6 89.9  PLT 146* 139* 151 159 164     Basic  Metabolic Panel: Recent Labs  Lab 10/25/21 1540 10/26/21 0225 10/27/21 0234 10/28/21 0230 10/29/21 0342  NA 135 138 137 138 138  K 3.8 3.9 4.2 4.2 4.7  CL 102 104 102 103 100  CO2 '24 22 22 24 24  '$ GLUCOSE 211* 251* 275* 184* 228*  BUN 20 17 30* 39* 44*  CREATININE 1.09* 0.96 1.30* 1.32* 1.43*  CALCIUM 9.6 9.2 9.8 10.1 9.6  MG  --  1.8  --   --   --   PHOS  --  2.8  --   --   --      GFR: Estimated Creatinine Clearance: 31.1 mL/min (A) (by C-G formula based on SCr of 1.43 mg/dL (H)).  Liver Function Tests: Recent Labs  Lab 10/25/21 1540 10/26/21 0225  AST 12* 15  ALT 11 14  ALKPHOS 51 51  BILITOT 0.5 0.8  PROT 6.9 6.7  ALBUMIN 4.1 3.6     CBG: Recent Labs  Lab 10/28/21 1239 10/28/21 1701 10/28/21 2102 10/29/21 0731 10/29/21 1146  GLUCAP 314* 287* 352* 212* 216*      Recent Results (from the past 240 hour(s))  Urine Culture     Status: Abnormal   Collection Time: 10/27/21  9:10 AM   Specimen: Urine, Catheterized  Result Value Ref Range Status   Specimen Description URINE, CATHETERIZED  Final   Special Requests   Final    NONE Performed at [facility], [place] [address], [place], [place] [address]    Culture >=100,000 COLONIES/mL ESCHERICHIA COLI (A)  Final   Report Status 10/29/2021 FINAL  Final   Organism ID, Bacteria ESCHERICHIA COLI (A)  Final      Susceptibility   Escherichia coli - MIC*    AMPICILLIN 8 SENSITIVE Sensitive     CEFAZOLIN <=4 SENSITIVE Sensitive     CEFEPIME <=0.12 SENSITIVE Sensitive     CEFTRIAXONE <=0.25 SENSITIVE Sensitive     CIPROFLOXACIN <=0.25 SENSITIVE Sensitive     GENTAMICIN <=1 SENSITIVE Sensitive     IMIPENEM <=0.25 SENSITIVE Sensitive     NITROFURANTOIN 32 SENSITIVE Sensitive     TRIMETH/SULFA <=20 SENSITIVE Sensitive     AMPICILLIN/SULBACTAM 8 SENSITIVE Sensitive     PIP/TAZO <=4 SENSITIVE Sensitive     * >=100,000 COLONIES/mL ESCHERICHIA COLI         Radiology Studies: EEG adult  Result Date:  10/27/2021 Derek Jack, MD     10/27/2021  7:08 PM Routine EEG Report ULLA MCKIERNAN is a 81 y.o. female with a history of stroke and meningioma who is undergoing an EEG to evaluate for seizures. Report: This EEG was acquired with electrodes placed according to the International 10-20 electrode system (including Fp1, Fp2, F3, F4, C3, C4, P3, P4, O1, O2, T3, T4, T5, T6, A1, A2, Fz, Cz, Pz). The following  electrodes were missing or displaced: none. The occipital dominant rhythm was 6 Hz. This activity is reactive to stimulation. Drowsiness was manifested by background fragmentation; deeper stages of sleep were identified by K complexes and sleep spindles. There was focal slowing over the left frontal region. There were no interictal epileptiform discharges. There were no electrographic seizures identified. Photic stimulation and hyperventilation were not performed. Impression and clinical correlation: This EEG was obtained while awake and asleep and is abnormal due to mild diffuse slowing indicative of global cerebral dysfunction and superimposed focal slowing over the left frontal region in the area of patient's known meningioma. Epileptiform abnormalities were not seen during this recording. Su Monks, MD [facility] [phone] If 7pm- 7am, please page neurology on call as listed in [place].        Scheduled Meds:  aspirin EC  81 mg Oral Daily   calcium carbonate  1,250 mg Oral Q breakfast   clopidogrel  75 mg Oral Daily   dexamethasone (DECADRON) injection  4 mg Intravenous Q12H   fenofibrate  160 mg Oral QPC breakfast   folic acid  1 mg Oral Daily   insulin aspart  0-15 Units Subcutaneous TID WC   insulin aspart  0-5 Units Subcutaneous QHS   insulin glargine-yfgn  38 Units Subcutaneous Daily   irbesartan  150 mg Oral Daily   pantoprazole  40 mg Oral Daily   rosuvastatin  20 mg Oral Daily   Continuous Infusions:     LOS: 3 days    Time spent: 40 minutes    Irine Seal, MD [facility]   To contact the attending provider between 7A-7P or the covering provider during after hours 7P-7A, please log into the web site [email or web address] and access using universal  password for that web site. If you do not have the password, please call the hospital operator.  10/29/2021, 12:20 PM

## 2021-10-29 NOTE — Progress Notes (Signed)
Inpatient Rehab Admissions Coordinator:    I received insurance auth for CIR, though Pt. With pending stent procedure tomorrow. I will follow up for potential admit afterwards. Pt. And son updated  Clemens Catholic, [place], [place] Admissions Coordinator  [phone] ([place]) [phone] (office)

## 2021-10-30 DIAGNOSIS — E782 Mixed hyperlipidemia: Secondary | ICD-10-CM | POA: Diagnosis not present

## 2021-10-30 DIAGNOSIS — N1832 Chronic kidney disease, stage 3b: Secondary | ICD-10-CM | POA: Diagnosis not present

## 2021-10-30 DIAGNOSIS — I6322 Cerebral infarction due to unspecified occlusion or stenosis of basilar arteries: Secondary | ICD-10-CM | POA: Diagnosis not present

## 2021-10-30 DIAGNOSIS — I651 Occlusion and stenosis of basilar artery: Secondary | ICD-10-CM | POA: Diagnosis not present

## 2021-10-30 DIAGNOSIS — G9389 Other specified disorders of brain: Secondary | ICD-10-CM | POA: Diagnosis not present

## 2021-10-30 DIAGNOSIS — R900 Intracranial space-occupying lesion found on diagnostic imaging of central nervous system: Secondary | ICD-10-CM | POA: Diagnosis not present

## 2021-10-30 LAB — CBC
HCT: 42 % (ref 36.0–46.0)
Hemoglobin: 14.2 g/dL (ref 12.0–15.0)
MCH: 30.4 pg (ref 26.0–34.0)
MCHC: 33.8 g/dL (ref 30.0–36.0)
MCV: 89.9 fL (ref 80.0–100.0)
Platelets: 156 10*3/uL (ref 150–400)
RBC: 4.67 MIL/uL (ref 3.87–5.11)
RDW: 14.6 % (ref 11.5–15.5)
WBC: 9.3 10*3/uL (ref 4.0–10.5)
nRBC: 0 % (ref 0.0–0.2)

## 2021-10-30 LAB — GLUCOSE, CAPILLARY
Glucose-Capillary: 165 mg/dL — ABNORMAL HIGH (ref 70–99)
Glucose-Capillary: 232 mg/dL — ABNORMAL HIGH (ref 70–99)
Glucose-Capillary: 254 mg/dL — ABNORMAL HIGH (ref 70–99)
Glucose-Capillary: 355 mg/dL — ABNORMAL HIGH (ref 70–99)

## 2021-10-30 LAB — BASIC METABOLIC PANEL
Anion gap: 10 (ref 5–15)
BUN: 45 mg/dL — ABNORMAL HIGH (ref 8–23)
CO2: 26 mmol/L (ref 22–32)
Calcium: 9.1 mg/dL (ref 8.9–10.3)
Chloride: 102 mmol/L (ref 98–111)
Creatinine, Ser: 1.22 mg/dL — ABNORMAL HIGH (ref 0.44–1.00)
GFR, Estimated: 44 mL/min — ABNORMAL LOW (ref >60)
Glucose, Bld: 221 mg/dL — ABNORMAL HIGH (ref 70–99)
Potassium: 4.5 mmol/L (ref 3.5–5.1)
Sodium: 138 mmol/L (ref 135–145)

## 2021-10-30 LAB — PROTIME-INR
INR: 1.2 (ref 0.8–1.2)
Prothrombin Time: 15 seconds (ref 11.4–15.2)

## 2021-10-30 NOTE — Progress Notes (Signed)
PROGRESS NOTE    Cassandra Dodson  YTK:[id number] DOB: 1940-09-10 DOA: 10/25/2021 PCP: Shon Baton, MD    Chief Complaint  Patient presents with   Weakness   Slurred Speech    Brief Narrative:  HPI per Dr. Camillia Herter is a 81 y.o. female with medical history significant for endometrial cancer, essential hypertension, type 2 diabetes, GERD, hypothyroidism, CKD 3B who initially presented to [facility] ED with complaints of slurred speech, left-sided weakness x2 days.  Associated with mild confusion.  No headache.  Noncontrast head CT done in the ED revealed 5.0 cm long axis intracranial mass highly likely to be a meningioma associated with 2.0 cm localized left to right midline shift with extensive vasogenic edema in the left frontal lobe.  EDP discussed the case with neurosurgery Dr. Ellene Route who recommended MRI brain with and without contrast and to add IV Decadron for the vasogenic edema.   Upon assessment at [facility], the patient is alert, follows commands, denies any headaches.   ED Course: Tmax 98.6.  BP 170/93, pulse 68, respiratory rate 29, saturation 96% on room air.  Lab studies significant for glucose 211, BUN 20, creatinine 1.09 GFR 51.  CBC essentially unremarkable except for platelet count of 146.    Assessment & Plan:   Principal Problem:   Brain mass Active Problems:   CVA (cerebral vascular accident) ([place])   Stage 3b chronic kidney disease (HCC)   Thrombocytopenia (HCC)   History of endometrial cancer   UTI (urinary tract infection)   Intracranial mass   Hyperlipidemia   Type 2 diabetes mellitus with stage 3b chronic kidney disease, without long-term current use of insulin ([place])   Basilar artery stenosis   #1 intracranial mass, seen on CT -Patient presented with left-sided weakness and slurred speech. -Noncontrast CT done in the ED with 5.0 cm long axis left paracentral intracranial mass along the planum sphenoidale and anterior falx, highly likely to  be a meningioma associated with 2.0 cm of localized left right midline shift and with extensive vasogenic edema in the left frontal lobe.  Frontal horn of the left lateral ventricle partially effaced.  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 -MRI brain done as recommended per neurosurgery with large acute right paramedian pontine infarct, anterior cranial fossa meningioma with large amount of surrounding vasogenic edema in the anterior left frontal lobe.  Rightward midline shift with subfalcine herniation of the left cingulate gyrus. -Neurosurgery consulted who recommended IV Decadron 4 mg every 6 hours. -Continue IV Decadron 4 mg every 12 hours x2 to 3 days and then 2 mg every 12 hours x2 to 3 days then subsequently discontinue per discussion with neurosurgery, Dr. Annette Stable. -Neurosurgery also recommending conservative treatment at this time allowing patient to recover from stroke before considering major surgical intervention to debulk and remove left frontal meningioma. -Continue GI prophylaxis with PPI.   -Per neurosurgery.  2.  Acute right paramedian pontine infarct -Noted on MRI brain. -Stroke work-up underway and stroke pathway ordered. -CT angiogram head and neck ordered with no LVO, multivessel multifocal stenosis.  Focal marked stenosis of the basilar proximal to superior cerebellar artery origins. 2D echo ordered. -Patient was on aspirin prior to admission. -Permissive hypertension -Neurology consulted and feels stroke appears to be due to small vessel disease, also noted that patient seemed to have bradyphrenia and executive dysfunction which could be due to edema on the left frontal lobe. -2D echo with EF of 60 to 65%, NWMA, moderate asymmetric LVH  of the basal septal, no embolic source noted. -TSH within normal limits at 0.993, folate decreased at 5.8, vitamin B12 at 287. -LDL of 133. -EEG done. -Continue statin with goal LDL < 70. -Patient currently on aspirin and Plavix for dual  antiplatelet treatment per neurology recommendations. -Patient being followed by PT/OT/SLP. -Per neurology.  3 basilar artery stenosis -CT angiogram head and neck with marked stenosis of basilar artery proximal to SCA's. -Being followed by neurology who have consulted IR for further evaluation. -Patient seen in consultation by IR, Dr. Estanislado Pandy on 10/28/2021 and cerebral angiogram with angioplasty/possible stent placement within the basilar artery plan tentatively for Tuesday, 10/30/2021 under general anesthesia however has been rescheduled to Thursday, 11/01/2021.   -Per IR no need to hold aspirin and Plavix. -Per IR and neurology.  4.  CKD stage IIIb -Stable.  5.  Acute thrombocytopenia -Patient with no overt bleeding. -Thrombocytopenia resolved. -Follow.  6.  History of endometrial cancer -Patient follows with OB/GYN oncology, Dr. Berline Lopes in the outpatient setting. -Outpatient follow-up.  7.  Diabetes mellitus type 2 -Hemoglobin A1c 7.0 (10/26/2021) -Continue to hold oral hypoglycemic agents. -Elevated CBGs secondary to steroids. -CBG 165 this morning. -Continue Semglee to 38 units daily. -Monitor CBGs with steroid taper. -SSI.  8.  Hyperlipidemia -LDL of 33, goal LDL < 70 -Continue fenofibrate, rosuvastatin.   9.  Hypertension -Continue Avapro 150 mg daily.  -Permissive hypertension secondary to acute CVA. -Avoid low blood pressure.  #10 E. coli UTI -Urinalysis concerning for UTI. -Urine cultures > 100,000 colonies of E. coli pansensitive.  -Continue IV Rocephin to treat for total of 5 days as patient was to undergo cerebral angiogram with possible angioplasty versus stent placement.   -Follow.   DVT prophylaxis: SCDs Code Status: Full Family Communication: Updated patient.  No family at bedside. Disposition: ? CIR  Status is: Inpatient The patient will require care spanning > 2 midnights and should be moved to inpatient because: Severity of illness   Consultants:   Neurosurgery: Dr. Ellene Route Neurology: [place] 10/26/2021 Interventional radiology: Dr.Deveshwar 10/28/2021  Procedures:  CT head 10/25/2021 CT angiogram head and neck 10/26/2021 pending MRI brain 10/25/2021 2D echo 10/26/2021 EEG 10/26/2021    Antimicrobials:  IV Rocephin 10/27/2021>>>> 11/01/2021   Subjective: Sitting up in bed eating breakfast.  No chest pain.  No shortness of breath.  No abdominal pain.  Patient stated procedure canceled today and will be done on Thursday.  No family at bedside.  Objective: Vitals:   10/29/21 2235 10/29/21 2305 10/30/21 0310 10/30/21 0737  BP: 121/71 114/76 121/66 (!) 160/69  Pulse:  82 71 73  Resp:  (!) 22 (!) 22 14  Temp:  98.5 F (36.9 C) 98.4 F (36.9 C) 97.8 F (36.6 C)  TempSrc:  Axillary Axillary Oral  SpO2:  96% 96% 95%  Weight:      Height:        Intake/Output Summary (Last 24 hours) at 10/30/2021 1036 Last data filed at 10/29/2021 1822 Gross per 24 hour  Intake 480 ml  Output 400 ml  Net 80 ml    Filed Weights   10/25/21 1531  Weight: 76.7 kg    Examination:  General exam: NAD.       Respiratory system: Lungs clear to auscultation bilaterally.  No wheezes, no crackles, no rhonchi.  Normal respiratory effort.   Cardiovascular system: RRR no murmurs rubs or gallops.  No JVD.  No lower extremity edema.   Gastrointestinal system: Abdomen is soft, nontender, nondistended, positive  bowel sounds.  No rebound.  No guarding.  Central nervous system: Alert and oriented. Left facial weakness.  Dysarthric speech improving.  Left upper extremity weakness improving.  Extremities: Symmetric 5 x 5 power. Skin: No rashes, lesions or ulcers Psychiatry: Judgement and insight appear normal. Mood & affect appropriate.     Data Reviewed: I have personally reviewed following labs and imaging studies  CBC: Recent Labs  Lab 10/25/21 1540 10/26/21 0225 10/27/21 0234 10/28/21 0230 10/29/21 0342 10/30/21 0449  WBC 5.7 5.8 7.2 9.0 8.2 9.3   NEUTROABS 3.7 5.1 6.1 7.3  --   --   HGB 14.1 14.5 14.4 15.4* 14.6 14.2  HCT 43.1 42.6 41.4 45.0 42.8 42.0  MCV 91.7 89.9 88.1 88.6 89.9 89.9  PLT 146* 139* 151 159 164 156     Basic Metabolic Panel: Recent Labs  Lab 10/26/21 0225 10/27/21 0234 10/28/21 0230 10/29/21 0342 10/30/21 0449  NA 138 137 138 138 138  K 3.9 4.2 4.2 4.7 4.5  CL 104 102 103 100 102  CO2 '22 22 24 24 26  '$ GLUCOSE 251* 275* 184* 228* 221*  BUN 17 30* 39* 44* 45*  CREATININE 0.96 1.30* 1.32* 1.43* 1.22*  CALCIUM 9.2 9.8 10.1 9.6 9.1  MG 1.8  --   --   --   --   PHOS 2.8  --   --   --   --      GFR: Estimated Creatinine Clearance: 36.4 mL/min (A) (by C-G formula based on SCr of 1.22 mg/dL (H)).  Liver Function Tests: Recent Labs  Lab 10/25/21 1540 10/26/21 0225  AST 12* 15  ALT 11 14  ALKPHOS 51 51  BILITOT 0.5 0.8  PROT 6.9 6.7  ALBUMIN 4.1 3.6     CBG: Recent Labs  Lab 10/29/21 0731 10/29/21 1146 10/29/21 1613 10/29/21 2121 10/30/21 0736  GLUCAP 212* 216* 193* 260* 165*      Recent Results (from the past 240 hour(s))  Urine Culture     Status: Abnormal   Collection Time: 10/27/21  9:10 AM   Specimen: Urine, Catheterized  Result Value Ref Range Status   Specimen Description URINE, CATHETERIZED  Final   Special Requests   Final    NONE Performed at [facility], [place] [address] [place],  [address]    Culture >=100,000 COLONIES/mL ESCHERICHIA COLI (A)  Final   Report Status 10/29/2021 FINAL  Final   Organism ID, Bacteria ESCHERICHIA COLI (A)  Final      Susceptibility   Escherichia coli - MIC*    AMPICILLIN 8 SENSITIVE Sensitive     CEFAZOLIN <=4 SENSITIVE Sensitive     CEFEPIME <=0.12 SENSITIVE Sensitive     CEFTRIAXONE <=0.25 SENSITIVE Sensitive     CIPROFLOXACIN <=0.25 SENSITIVE Sensitive     GENTAMICIN <=1 SENSITIVE Sensitive     IMIPENEM <=0.25 SENSITIVE Sensitive     NITROFURANTOIN 32 SENSITIVE Sensitive     TRIMETH/SULFA <=20 SENSITIVE Sensitive      AMPICILLIN/SULBACTAM 8 SENSITIVE Sensitive     PIP/TAZO <=4 SENSITIVE Sensitive     * >=100,000 COLONIES/mL ESCHERICHIA COLI         Radiology Studies: No results found.      Scheduled Meds:  aspirin EC  81 mg Oral Daily   calcium carbonate  1,250 mg Oral Q breakfast   clopidogrel  75 mg Oral Daily   dexamethasone (DECADRON) injection  4 mg Intravenous Q12H   fenofibrate  160 mg Oral  QPC breakfast   folic acid  1 mg Oral Daily   insulin aspart  0-15 Units Subcutaneous TID WC   insulin aspart  0-5 Units Subcutaneous QHS   insulin glargine-yfgn  38 Units Subcutaneous Daily   irbesartan  150 mg Oral Daily   pantoprazole  40 mg Oral Daily   rosuvastatin  20 mg Oral Daily   Continuous Infusions:  cefTRIAXone (ROCEPHIN)  IV 1 g (10/30/21 0911)      LOS: 4 days    Time spent: 40 minutes    Irine Seal, MD [facility]   To contact the attending provider between 7A-7P or the covering provider during after hours 7P-7A, please log into the web site [email or web address] and access using universal [place] password for that web site. If you do not have the password, please call the hospital operator.  10/30/2021, 10:36 AM

## 2021-10-30 NOTE — Progress Notes (Signed)
STROKE TEAM PROGRESS NOTE   INTERVAL HISTORY No family is at the bedside. Pt no acute event overnight and neuro stable. BA stenting/angioplasty scheduled to Thursday.   Vitals:   10/30/21 0310 10/30/21 0737 10/30/21 1149 10/30/21 1509  BP: 121/66 (!) 160/69 (!) 164/104 (!) 153/81  Pulse: 71 73 84 87  Resp: (!) '22 14 20 20  '$ Temp: 98.4 F (36.9 C) 97.8 F (36.6 C) 98 F (36.7 C) 97.7 F (36.5 C)  TempSrc: Axillary Oral Oral Oral  SpO2: 96% 95% 98% 98%  Weight:      Height:       CBC:  Recent Labs  Lab 10/27/21 0234 10/28/21 0230 10/29/21 0342 10/30/21 0449  WBC 7.2 9.0 8.2 9.3  NEUTROABS 6.1 7.3  --   --   HGB 14.4 15.4* 14.6 14.2  HCT 41.4 45.0 42.8 42.0  MCV 88.1 88.6 89.9 89.9  PLT 151 159 164 790   Basic Metabolic Panel:  Recent Labs  Lab 10/26/21 0225 10/27/21 0234 10/29/21 0342 10/30/21 0449  NA 138   < > 138 138  K 3.9   < > 4.7 4.5  CL 104   < > 100 102  CO2 22   < > 24 26  GLUCOSE 251*   < > 228* 221*  BUN 17   < > 44* 45*  CREATININE 0.96   < > 1.43* 1.22*  CALCIUM 9.2   < > 9.6 9.1  MG 1.8  --   --   --   PHOS 2.8  --   --   --    < > = values in this interval not displayed.   Lipid Panel:  Recent Labs  Lab 10/27/21 0234  CHOL 198  TRIG 59  HDL 53  CHOLHDL 3.7  VLDL 12  LDLCALC 133*   HgbA1c:  Recent Labs  Lab 10/26/21 0225  HGBA1C 7.0*   Urine Drug Screen: No results for input(s): "LABOPIA", "COCAINSCRNUR", "LABBENZ", "AMPHETMU", "THCU", "LABBARB" in the last 168 hours.  Alcohol Level  Recent Labs  Lab 10/25/21 1540  ETH <10    IMAGING past 24 hours No results found.  PHYSICAL EXAM General:  Alert, well-nourished, well-developed patient in no acute distress Respiratory:  Regular, unlabored respirations on room air  NEURO:  awake, alert, eyes open, orientated to age, place, month and people, but not to year. No aphasia, fluent language, following all simple commands, mild dysarthria. Able to name and repeat. No gaze palsy,  tracking bilaterally, visual field full.  Mild left facial droop. Tongue midline. RUE 5/5, LUE 4+/5 with mild pronator drift. Bilaterally LEs 4/5 proximal and distal. Sensation symmetrical bilaterally, b/l FTN intact but slow, gait not tested.    ASSESSMENT/PLAN [name] is a 81 y.o. female with history of  HTN, DM, CKD3, GERD and endometrial cancer  presenting with slurred speech for two days and left leg weakness for one day.  Patient's son states that she first seemed to be having problems on 7/4 when she had some difficulty with ambulation, then developed slurred speech and dragging of the left leg while walking.  [place] shows a left paracentral meningioma with vasogenic edema, and MRI brain shows a right paramedian pontine stroke. Patient denies any prior known history of meningioma or brain problems.  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  Therapy recommendations:  CIR Disposition:  pending  Basilar artery stenosis CTA head & neck marked stenosis of BA proximal to SCAs Per son, pt baseline walks with walker due to left hip and b/l knee replacement but lives independently PTA Now with mild left facial droop and left UE drift post stroke Given high risk of recurrent stroke with devastating outcome, basilar artery angioplasty or stent planned for Thursday  now IR Dr. Estanislado Pandy on board  Left frontal large meningioma CT head 5.0 cm intracranial mass along anterior falx, likely to be a meningioma with vasogenic edema and 2 cm localized midline shift MRI left anterior cranial fossa meningioma with vasogenic edema and MLS Neurosurgery on board Plan for possible surgical resection in 8-12 weeks first On decardron  Hypertension Home meds:  telmisartan 80 mg daily Stable but fluctuate On low dose avapro Avoid low BP Long-term BP goal normotensive  Hyperlipidemia Home meds:  fenofibrate 160 mg daily, resumed in hospital LDL 133, goal < 70 Add rosuvastatin 20 mg daily  Continue statin at discharge  Diabetes type II Uncontrolled Home meds:  metformin 1000 mg BID HgbA1c 7.0, goal < 7.0 CBGs SSI On insulin now Hyperglycemia improved, especially on decardron now Close PCP follow up for better DM control in the setting of steroid use  Dysphagia Speech on board Now on nectar thick liquid Aspiration precaution  Other Stroke Risk Factors Advanced Age >/= 63  Former cigarette smoker  Other Active Problems Hx of left hip and b/l Knee replacement   Hospital day # 4   Rosalin Hawking, MD PhD Stroke Neurology 10/30/2021 4:28 PM    To contact Stroke Continuity provider, please refer to [email or web address] After hours, contact General Neurology

## 2021-10-30 NOTE — Progress Notes (Signed)
Inpatient Rehab Admissions Coordinator:   Pt. Not medically ready for CIR--per Pt., Stent procedure rescheduled for Thursday. I will follow for admission once that procedure is complete. I did clarify with NSGY that pt. Will not undergo intervention for meningioma for a couple of months.   Clemens Catholic, [place], [place] Admissions Coordinator  [phone] ([place]) [phone] (office)

## 2021-10-30 NOTE — Progress Notes (Signed)
Occupational Therapy Treatment Patient Details Name: Cassandra Dodson MRN: [id number] DOB: [birth year] Today's Date: 10/30/2021   History of present illness Pt is an 81 y/o female admitted secondary to slurred speech and L sided weakness. 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 PMH including but not limited to HTN, DM, CKD3, GERD and endometrial cancer.   OT comments  Pt making steady progress towards OT goals this session. Pt continues to present with impaired balance, LUE hemiparesis, decreased activity tolerance and generalized deconditioning. Pt currently requires MIN A +1 for ADL transfers in Room with pt needing MOD cues for sequencing during mobility tasks and MOD cues for attention to task as pt easily distracted even in quiet environment. Pt would continue to benefit from skilled occupational therapy while admitted and after d/c to address the below listed limitations in order to improve overall functional mobility and facilitate independence with BADL participation. DC plan remains appropriate, will follow acutely per POC.      Recommendations for follow up therapy are one component of a multi-disciplinary discharge planning process, led by the attending physician.  Recommendations may be updated based on patient status, additional functional criteria and insurance authorization.    Follow Up Recommendations  Acute inpatient rehab (3hours/day)    Assistance Recommended at Discharge Frequent or constant Supervision/Assistance  Patient can return home with the following  A little help with walking and/or transfers;A lot of help with bathing/dressing/bathroom;Assistance with cooking/housework;Direct supervision/assist for medications management;Direct  supervision/assist for financial management;Assist for transportation;Help with stairs or ramp for entrance   Equipment Recommendations  BSC/3in1    Recommendations for Other Services      Precautions / Restrictions Precautions Precautions: Fall Precaution Comments: urinary incontenance, wears briefs Restrictions Weight Bearing Restrictions: No       Mobility Bed Mobility Overal bed mobility: Needs Assistance Bed Mobility: Supine to Sit     Supine to sit: Min assist, HOB elevated     General bed mobility comments: + time and effort with MOD cues needed for sequencing and MIN A need to scoot hips to EOB    Transfers Overall transfer level: Needs assistance Equipment used: Rolling walker (2 wheels) Transfers: Sit to/from Stand Sit to Stand: Min assist           General transfer comment: sit<>stand x2 from EOB with MIN A to rise, cues for hand placement and initial assist to boost hips into standing     Balance Overall balance assessment: Needs assistance Sitting-balance support: Feet supported, No upper extremity supported Sitting balance-Leahy Scale: Fair     Standing balance support: Bilateral upper extremity supported, During functional activity Standing balance-Leahy Scale: Poor Standing balance comment: reliant on BUE support                           ADL either performed or assessed with clinical judgement   ADL Overall ADL's : Needs assistance/impaired                         Toilet Transfer: Minimal assistance;Cueing for sequencing;Cueing for safety;Ambulation Toilet Transfer Details (indicate cue type and reason): simulated via functional mobility with RW         Functional mobility during ADLs: Minimal  assistance;Cueing for sequencing;Cueing for safety;Rolling walker (2 wheels) General ADL Comments: emphasis on dynamic standing balance, RW mgmt, and functional mobility.    Extremity/Trunk Assessment Upper Extremity  Assessment Upper Extremity Assessment: Defer to OT evaluation LUE Deficits / Details: good awareness to LUE overall 3+/5 mmt LUE Sensation: decreased light touch;decreased proprioception LUE Coordination: decreased fine motor;decreased gross motor   Lower Extremity Assessment Lower Extremity Assessment: Defer to PT evaluation   Cervical / Trunk Assessment Cervical / Trunk Assessment: Kyphotic;Other exceptions Cervical / Trunk Exceptions: increased body habitus    Vision Baseline Vision/History: 1 Wears glasses Patient Visual Report: No change from baseline;Other (comment) (per gross assessment, will continue to assess)     Perception Perception Perception: Impaired   Praxis Praxis Praxis: Impaired Praxis Impairment Details: Motor planning    Cognition Arousal/Alertness: Awake/alert Behavior During Therapy: WFL for tasks assessed/performed Overall Cognitive Status: History of cognitive impairments - at baseline Area of Impairment: Attention, Safety/judgement, Following commands, Memory, Awareness, Problem solving                   Current Attention Level: Sustained Memory: Decreased short-term memory Following Commands: Follows one step commands with increased time, Follows one step commands inconsistently Safety/Judgement: Decreased awareness of deficits Awareness: Intellectual Problem Solving: Slow processing, Decreased initiation, Difficulty sequencing General Comments: decreased time needed to follow commands, pt also HOH so likely contributing to cognitive deficits, poor attention easily distracted during session.         Exercises      Shoulder Instructions       General Comments HR up to 117 bpm, pt reports that her son could stay with her for awhile after CIR admit if needed.     Pertinent Vitals/ Pain       Pain Assessment Pain Assessment: No/denies pain  Home Living                                          Prior  Functioning/Environment              Frequency  Min 2X/week        Progress Toward Goals  OT Goals(current goals can now be found in the care plan section)  Progress towards OT goals: Progressing toward goals  Acute Rehab OT Goals Patient Stated Goal: none stated Time For Goal Achievement: 11/09/21 Potential to Achieve Goals: Good  Plan Discharge plan remains appropriate;Frequency remains appropriate    Co-evaluation                 AM-PAC OT "6 Clicks" Daily Activity     Outcome Measure   Help from another person eating meals?: A Little Help from another person taking care of personal grooming?: A Little Help from another person toileting, which includes using toliet, bedpan, or urinal?: A Little Help from another person bathing (including washing, rinsing, drying)?: A Little Help from another person to put on and taking off regular upper body clothing?: A Little Help from another person to put on and taking off regular lower body clothing?: A Lot 6 Click Score: 17    End of Session Equipment Utilized During Treatment: Gait belt;Rolling walker (2 wheels)  OT Visit Diagnosis: Unsteadiness on feet (R26.81);Other abnormalities of gait and mobility (R26.89);Muscle weakness (generalized) (M62.81);Other symptoms and signs involving cognitive function;Other symptoms and signs involving the nervous system (R29.898);Hemiplegia and hemiparesis Hemiplegia - Right/Left: Left  Hemiplegia - dominant/non-dominant: Non-Dominant Hemiplegia - caused by: Cerebral infarction   Activity Tolerance Patient tolerated treatment well   Patient Left in bed;with call bell/phone within reach;with bed alarm set   Nurse Communication Mobility status;Other (comment) (found a pill on the floor; needs nectar h2o)        Time: 2500-3704 OT Time Calculation (min): 18 min  Charges: OT General Charges $OT Visit: 1 Visit OT Treatments $Therapeutic Activity: 8-22 mins  Harley Alto.,  COTA/L Acute Rehabilitation Services [phone]   Precious Haws 10/30/2021, 11:36 AM

## 2021-10-31 ENCOUNTER — Other Ambulatory Visit (HOSPITAL_COMMUNITY): Payer: Self-pay | Admitting: Physician Assistant

## 2021-10-31 DIAGNOSIS — I6322 Cerebral infarction due to unspecified occlusion or stenosis of basilar arteries: Secondary | ICD-10-CM | POA: Diagnosis not present

## 2021-10-31 DIAGNOSIS — E1122 Type 2 diabetes mellitus with diabetic chronic kidney disease: Secondary | ICD-10-CM | POA: Diagnosis not present

## 2021-10-31 DIAGNOSIS — I651 Occlusion and stenosis of basilar artery: Secondary | ICD-10-CM | POA: Diagnosis not present

## 2021-10-31 DIAGNOSIS — E782 Mixed hyperlipidemia: Secondary | ICD-10-CM | POA: Diagnosis not present

## 2021-10-31 DIAGNOSIS — G9389 Other specified disorders of brain: Secondary | ICD-10-CM | POA: Diagnosis not present

## 2021-10-31 DIAGNOSIS — Z8542 Personal history of malignant neoplasm of other parts of uterus: Secondary | ICD-10-CM | POA: Diagnosis not present

## 2021-10-31 DIAGNOSIS — Z01818 Encounter for other preprocedural examination: Secondary | ICD-10-CM

## 2021-10-31 LAB — CBC WITH DIFFERENTIAL/PLATELET
Abs Immature Granulocytes: 0.09 10*3/uL — ABNORMAL HIGH (ref 0.00–0.07)
Basophils Absolute: 0 10*3/uL (ref 0.0–0.1)
Basophils Relative: 0 %
Eosinophils Absolute: 0 10*3/uL (ref 0.0–0.5)
Eosinophils Relative: 0 %
HCT: 40.5 % (ref 36.0–46.0)
Hemoglobin: 13.9 g/dL (ref 12.0–15.0)
Immature Granulocytes: 1 %
Lymphocytes Relative: 9 %
Lymphs Abs: 1 10*3/uL (ref 0.7–4.0)
MCH: 30.9 pg (ref 26.0–34.0)
MCHC: 34.3 g/dL (ref 30.0–36.0)
MCV: 90 fL (ref 80.0–100.0)
Monocytes Absolute: 0.9 10*3/uL (ref 0.1–1.0)
Monocytes Relative: 9 %
Neutro Abs: 8.5 10*3/uL — ABNORMAL HIGH (ref 1.7–7.7)
Neutrophils Relative %: 81 %
Platelets: 137 10*3/uL — ABNORMAL LOW (ref 150–400)
RBC: 4.5 MIL/uL (ref 3.87–5.11)
RDW: 14.6 % (ref 11.5–15.5)
WBC: 10.5 10*3/uL (ref 4.0–10.5)
nRBC: 0 % (ref 0.0–0.2)

## 2021-10-31 LAB — BASIC METABOLIC PANEL
Anion gap: 10 (ref 5–15)
BUN: 48 mg/dL — ABNORMAL HIGH (ref 8–23)
CO2: 26 mmol/L (ref 22–32)
Calcium: 9 mg/dL (ref 8.9–10.3)
Chloride: 99 mmol/L (ref 98–111)
Creatinine, Ser: 1.43 mg/dL — ABNORMAL HIGH (ref 0.44–1.00)
GFR, Estimated: 37 mL/min — ABNORMAL LOW (ref >60)
Glucose, Bld: 277 mg/dL — ABNORMAL HIGH (ref 70–99)
Potassium: 4.7 mmol/L (ref 3.5–5.1)
Sodium: 135 mmol/L (ref 135–145)

## 2021-10-31 LAB — GLUCOSE, CAPILLARY
Glucose-Capillary: 193 mg/dL — ABNORMAL HIGH (ref 70–99)
Glucose-Capillary: 187 mg/dL — ABNORMAL HIGH (ref 70–99)
Glucose-Capillary: 351 mg/dL — ABNORMAL HIGH (ref 70–99)
Glucose-Capillary: 342 mg/dL — ABNORMAL HIGH (ref 70–99)

## 2021-10-31 LAB — MAGNESIUM: Magnesium: 2.3 mg/dL (ref 1.7–2.4)

## 2021-10-31 NOTE — Progress Notes (Addendum)
Patient ID: Cassandra Dodson, female   DOB: 06/05/1940, 81 y.o.   MRN: [id number] Patient progress noted. We will hold off on any consideration of surgical intervention until patient recovers from stenting procedure. In fact we may be able to simply observe the meningioma should she have symptoms particularly attributable to it.

## 2021-10-31 NOTE — Progress Notes (Signed)
Physical Therapy Treatment Patient Details Name: Cassandra Dodson MRN: [id number] DOB: 12-Sep-1940 Today's Date: 10/31/2021   History of Present Illness Pt is an 81 y/o female admitted secondary to slurred speech and L sided weakness. 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 Plan for stent placement 7/13. PMH including but not limited to HTN, DM, CKD3, GERD and endometrial cancer.    PT Comments    The pt was agreeable to session and eager to progress with therapy this morning. She was able to make good progress with ambulation distance but continued to need increased assist (up to modA) to correct L lateral lean and direct RW, especially with fatigue. Increased assist needed with any turning or distraction. Continue to recommend acute inpatient rehab when medically stable for d/c to facilitate functional recovery, improve balance, coordination, and safety with mobility.     Recommendations for follow up therapy are one component of a multi-disciplinary discharge planning process, led by the attending physician.  Recommendations may be updated based on patient status, additional functional criteria and insurance authorization.  Follow Up Recommendations  Acute inpatient rehab (3hours/day)     Assistance Recommended at Discharge Frequent or constant Supervision/Assistance  Patient can return home with the following Assistance with cooking/housework;Direct supervision/assist for medications management;Direct supervision/assist for financial management;Assist for transportation;Help with stairs or ramp for entrance;A lot of help with walking and/or transfers;A little help with bathing/dressing/bathroom   Equipment Recommendations  None recommended by PT     Recommendations for Other Services       Precautions / Restrictions Precautions Precautions: Fall Precaution Comments: urinary incontenance, wears briefs Restrictions Weight Bearing Restrictions: No     Mobility  Bed Mobility Overal bed mobility: Needs Assistance Bed Mobility: Supine to Sit     Supine to sit: Min assist, HOB elevated     General bed mobility comments: increased time and effort to complete    Transfers Overall transfer level: Needs assistance Equipment used: Rolling walker (2 wheels) Transfers: Sit to/from Stand Sit to Stand: Min assist, Mod assist           General transfer comment: completed with modA initially, cues each time for hand placement    Ambulation/Gait Ambulation/Gait assistance: Min assist, Mod assist Gait Distance (Feet): 25 Feet (+ 15 ft) Assistive device: Rolling walker (2 wheels) Gait Pattern/deviations: Decreased stride length, Narrow base of support, Trunk flexed, Step-to pattern, Decreased step length - right, Decreased step length - left, Shuffle, Scissoring, Decreased stance time - left, Knee flexed in stance - left Gait velocity: decreased Gait velocity interpretation: <1.31 ft/sec, indicative of household ambulator   General Gait Details: pt with progressively narrow BOS and step-to pattern with fatigue, leaning L, needing progressively increased assist    Modified Rankin (Stroke Patients Only) Modified Rankin (Stroke Patients Only) Pre-Morbid Rankin Score: Moderately severe disability Modified Rankin: Moderately severe disability     Balance Overall balance assessment: Needs assistance Sitting-balance support: Feet supported, No upper extremity supported Sitting balance-Leahy Scale: Fair   Postural control: Left lateral lean Standing balance support: Bilateral upper extremity supported, During functional activity Standing balance-Leahy Scale: Poor Standing balance comment: reliant on BUE support and up to modA  with fatigue and gait  Cognition Arousal/Alertness: Awake/alert Behavior During Therapy: WFL for tasks assessed/performed Overall Cognitive Status: History of cognitive impairments - at baseline Area of Impairment: Attention, Safety/judgement, Following commands, Memory, Awareness, Problem solving                   Current Attention Level: Sustained Memory: Decreased short-term memory Following Commands: Follows one step commands with increased time, Follows one step commands inconsistently Safety/Judgement: Decreased awareness of deficits Awareness: Intellectual Problem Solving: Slow processing, Decreased initiation, Difficulty sequencing General Comments: pt able to follow commands with slight increased time, asking many appropriate questions of staff. Needs instructions repeated at times        Exercises      General Comments General comments (skin integrity, edema, etc.): VSS on RA      Pertinent Vitals/Pain Pain Assessment Pain Assessment: No/denies pain     PT Goals (current goals can now be found in the care plan section) Acute Rehab PT Goals Patient Stated Goal: eager to mobilize, son wants her to go to rehab PT Goal Formulation: With patient/family Time For Goal Achievement: 11/09/21 Potential to Achieve Goals: Good Progress towards PT goals: Progressing toward goals    Frequency    Min 4X/week      PT Plan Current plan remains appropriate       AM-PAC PT "6 Clicks" Mobility   Outcome Measure  Help needed turning from your back to your side while in a flat bed without using bedrails?: A Little Help needed moving from lying on your back to sitting on the side of a flat bed without using bedrails?: A Lot Help needed moving to and from a bed to a chair (including a wheelchair)?: A Lot Help needed standing up from a chair using your arms (e.g., wheelchair or bedside chair)?: A Little Help needed to walk in  hospital room?: A Lot Help needed climbing 3-5 steps with a railing? : A Lot 6 Click Score: 14    End of Session Equipment Utilized During Treatment: Gait belt Activity Tolerance: Patient tolerated treatment well Patient left: with call bell/phone within reach;in chair;with chair alarm set Nurse Communication: Mobility status PT Visit Diagnosis: Other abnormalities of gait and mobility (R26.89);Other symptoms and signs involving the nervous system (S92.330)     Time: 0762-2633 PT Time Calculation (min) (ACUTE ONLY): 26 min  Charges:  $Gait Training: 23-37 mins                     [place], PT, DPT   Acute Rehabilitation Department   Sandra Cockayne 10/31/2021, 1:26 PM

## 2021-10-31 NOTE — Hospital Course (Addendum)
HPI per Dr. Irene Pap on 10/25/21  Cassandra Dodson is a 81 y.o. female with medical history significant for endometrial cancer, essential hypertension, type 2 diabetes, GERD, hypothyroidism, CKD 3B who initially presented to [facility] ED with complaints of slurred speech, left-sided weakness x2 days.  Associated with mild confusion.  No headache.  Noncontrast head CT done in the ED revealed 5.0 cm long axis intracranial mass highly likely to be a meningioma associated with 2.0 cm localized left to right midline shift with extensive vasogenic edema in the left frontal lobe.  EDP discussed the case with neurosurgery Dr. Ellene Route who recommended MRI brain with and without contrast and to add IV Decadron for the vasogenic edema.   Upon assessment at [facility], the patient is alert, follows commands, denies any headaches.  ED Course: Tmax 98.6.  BP 170/93, pulse 68, respiratory rate 29, saturation 96% on room air.  Lab studies significant for glucose 211, BUN 20, creatinine 1.09 GFR 51.  CBC essentially unremarkable except for platelet count of 146.  **Interim History   Neurology and both neurosurgery are following and neurosurgery recommending holding off any consideration of surgical intervention until she recovers from her stenting procedure and they feel that they may be just able to simply observe the meningioma.  Neurology evaluated given her acute CVA in the right paramedian pontine area with severe stenosis of the basilar artery and she is status post endovascular revascularization of the severely stenotic distal basilar artery with stent assisted angioplasty and subsequently she had to be mechanically vented and was left vented and was transferred to the intensive care unit.  Patient also developed a right arm hematoma just proximal to the procedure puncture site and a postprocedural hematoma protocol was followed.  She was placed on PCCM service but now been transferred back to the medicine  service on 11/04/2021.  Neurology continues to recommend aspirin 81 mg p.o. daily as well as clopidogrel 75 mg for least 3 to 6 months due to recent stenting and therapy recommendations for CIR.  SLP evaluated continue recommending dysphagia 3 diet with nectar thick liquids  Neurosurgery reevaluated and recommends that she will need a little bit more time to recover from her recent CVA before they consider any surgical interventions for her frontal meningioma and will follow-up again on 11/05/2021.  PT/OT recommending CIR and the rehab admissions coordinator is following for potential CIR admitted pending medical readiness after her stent procedure.

## 2021-10-31 NOTE — Progress Notes (Signed)
Inpatient Rehab Admissions Coordinator:    I continue to follow for potential CIR admit pending medical readiness. Will follow up after stent procedure tomorrow.   Clemens Catholic, [place], [place] Admissions Coordinator  [phone] ([place]) [phone] (office)

## 2021-10-31 NOTE — Progress Notes (Signed)
PROGRESS NOTE    Cassandra Dodson  PZW:[id number] DOB: [birth year] DOA: 10/25/2021 PCP: Shon Baton, MD   Brief Narrative:  HPI per Dr. Irene Pap on 10/25/21  Cassandra Dodson is a 81 y.o. female with medical history significant for endometrial cancer, essential hypertension, type 2 diabetes, GERD, hypothyroidism, CKD 3B who initially presented to [facility] ED with complaints of slurred speech, left-sided weakness x2 days.  Associated with mild confusion.  No headache.  Noncontrast head CT done in the ED revealed 5.0 cm long axis intracranial mass highly likely to be a meningioma associated with 2.0 cm localized left to right midline shift with extensive vasogenic edema in the left frontal lobe.  EDP discussed the case with neurosurgery Dr. Ellene Route who recommended MRI brain with and without contrast and to add IV Decadron for the vasogenic edema.   Upon assessment at [facility], the patient is alert, follows commands, denies any headaches.  ED Course: Tmax 98.6.  BP 170/93, pulse 68, respiratory rate 29, saturation 96% on room air.  Lab studies significant for glucose 211, BUN 20, creatinine 1.09 GFR 51.  CBC essentially unremarkable except for platelet count of 146.  **Interim History   Neurology and both neurosurgery are following and neurosurgery recommending holding off any consideration of surgical intervention until she recovers from her stenting procedure and they feel that they may be just able to simply observe the meningioma.  Neurology evaluated given her acute CVA in the right paramedian pontine area with severe stenosis of the basilar artery and she is currently pending bilateral stenting/angioplasty on 11/01/2021.  PT/OT recommending CIR and the rehab admissions coordinator is following for potential CIR admitted pending medical readiness after her stent procedure.   Assessment and Plan:  Intracranial mass, seen on CT likely a meningioma -Patient presented with left-sided weakness  and slurred speech. -Noncontrast CT done in the ED with 5.0 cm long axis left paracentral intracranial mass along the planum sphenoidale and anterior falx, highly likely to be a meningioma associated with 2.0 cm of localized left right midline shift and with extensive vasogenic edema in the left frontal lobe.  Frontal horn of the left lateral ventricle partially effaced.  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 -MRI brain done as recommended per neurosurgery with large acute right paramedian pontine infarct, anterior cranial fossa meningioma with large amount of surrounding vasogenic edema in the anterior left frontal lobe.  Rightward midline shift with subfalcine herniation of the left cingulate gyrus. -Neurosurgery consulted who recommended IV Decadron 4 mg every 6 hours and now recommending to continue IV Decadron 4 mg every 12 hours x2 to 3 days and then 2 mg every 12 hours x2 to 3 days then subsequently discontinue per discussion with neurosurgery, Dr. Annette Stable. -Neurosurgery also recommending conservative treatment at this time allowing patient to recover from stroke before considering major surgical intervention to debulk and remove left frontal meningioma. -Continue GI prophylaxis with PPI.   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 -Stroke work-up underway and stroke pathway ordered. -CT angiogram head and neck ordered with no LVO, multivessel multifocal stenosis.  Focal marked stenosis of the basilar proximal to superior cerebellar artery  origins. 2D echo ordered. -Patient was on aspirin prior to admission. -Permissive hypertension -Neurology  consulted and feels stroke appears to be due to large vessel disease, also noted that patient seemed to have bradyphrenia and executive dysfunction which could be due to edema on the left frontal lobe. -2D echo with EF of 60 to 65%, NWMA, moderate asymmetric LVH of the basal septal, no embolic source noted. -TSH within normal limits at 0.993, folate decreased at 5.8, vitamin B12 at 287. -LDL of 133.  Patient is on a statin of rosuvastatin 20 mg p.o. daily -EEG done and showed "This EEG was obtained while awake and asleep and is abnormal due to mild diffuse slowing indicative of global cerebral dysfunction and superimposed focal slowing over the left frontal region in the area of patient's known meningioma. Epileptiform abnormalities were not seen during this recording." -Continue statin with goal LDL < 70. -Patient currently on aspirin and Plavix for dual antiplatelet treatment per neurology recommendations. -Patient being followed by PT/OT/SLP and they are recommending CIR once ready for discharge -Per neurology.   Basilar Artery Stenosis -CT angiogram head and neck with marked stenosis of basilar artery proximal to SCA's. -Being followed by neurology who have consulted IR for further evaluation. -Patient seen in consultation by IR, Dr. Estanislado Pandy on 10/28/2021 and cerebral angiogram with angioplasty/possible stent placement within the basilar artery plan tentatively for Tuesday, 10/30/2021 under general anesthesia however has been rescheduled to Thursday, 11/01/2021.   -Per IR no need to hold aspirin and Plavix. -Per IR and neurology.  CKD stage IIIb -Stable. -Patient's BUNs/creatinine has gone from 45/1.22 is now slightly trended up to 48/1.43 -Avoid further nephrotoxic medications, contrast dyes, hypotension and dehydration and will need to renally adjust medications -Continue monitor and trend and repeat CMP in a.m.  Acute thrombocytopenia -Patient with no overt bleeding. -Thrombocytopenia  had resolved but is now back again and platelet count has gone from 156 -Continue to monitor and trend and repeat CMP in  History of Endometrial Cancer -Patient follows with OB/GYN oncology, Dr. Berline Lopes in the outpatient setting. -Outpatient follow-up.  Diabetes mellitus type 2 -Hemoglobin A1c 7.0 (10/26/2021) -Continue to hold oral hypoglycemic agents. -Elevated CBGs secondary to steroids. -CBGs ranging from 187-355 -Continue Semglee to 38 units daily. -Monitor CBGs with steroid taper. -Continue with moderate NovoLog/scale insulin before meals and at bedtime and may need to adjust further insulin regimen  Hyperlipidemia -LDL of 33, goal LDL < 70 -Continue fenofibrate 160 mg p.o. daily and rosuvastatin 20 mg p.o. daily  Hypertension -Continue Irbesartan 150 mg daily.  -Permissive hypertension secondary to acute CVA. -Avoid low blood pressure.    E. coli UTI -Urinalysis concerning for UTI. -Urine cultures > 100,000 colonies of E. coli pansensitive.   -Continued IV Rocephin to treat for total of 5 days as patient was to undergo cerebral angiogram with possible angioplasty versus stent placement.   -Patient has completed antibiotics at this time now  DVT prophylaxis: Place and maintain sequential compression device Start: 10/26/21 1623 SCDs Start: 10/25/21 2158    Code Status: Full Code Family Communication: Discussed with son and family at bedside  Disposition Plan:  Level of care: Telemetry Cardiac Status is: Inpatient Remains inpatient appropriate because: Undergoing basilar artery stenting versus angioplasty in the morning   Consultants:  Neurosurgery Interventional radiology Neurology CIR  Procedures:  CT head 10/25/2021 CT angiogram head and neck 10/26/2021  MRI brain 10/25/2021 2D echo 10/26/2021 EEG 10/26/2021  Antimicrobials:  Anti-infectives (From admission, onward)  Start     Dose/Rate Route Frequency Ordered Stop   10/30/21 0900  cefTRIAXone (ROCEPHIN) 1 g in  sodium chloride 0.9 % 100 mL IVPB        1 g 200 mL/hr over 30 Minutes Intravenous Every 24 hours 10/29/21 1230 10/31/21 0824   10/27/21 1000  cefTRIAXone (ROCEPHIN) 2 g in sodium chloride 0.9 % 100 mL IVPB        2 g 200 mL/hr over 30 Minutes Intravenous Every 24 hours 10/27/21 0910 10/29/21 0952       Subjective: Seen and examined at bedside and son thinks that she is closer to baseline.  She is undergoing stenting in the morning.  She denies chest pain or shortness of breath.  Slurred speech is improved.  Denies any other concerns or complaints this time.  Objective: Vitals:   10/30/21 2300 10/31/21 0305 10/31/21 0736 10/31/21 1140  BP: (!) 153/82 (!) 152/75 (!) 229/99 (!) 163/69  Pulse: 78 66 71 87  Resp: 20 (!) 23 19 (!) 22  Temp: 97.8 F (36.6 C) 97.8 F (36.6 C) 98.5 F (36.9 C) 98 F (36.7 C)  TempSrc: Oral Axillary Oral Oral  SpO2: 96% 95% 96% 90%  Weight:      Height:        Intake/Output Summary (Last 24 hours) at 10/31/2021 1611 Last data filed at 10/31/2021 1000 Gross per 24 hour  Intake 760 ml  Output 450 ml  Net 310 ml   Filed Weights   10/25/21 1531  Weight: 76.7 kg   Examination: Physical Exam:  Constitutional: WN/WD overweight Caucasian female in NAD appears calm.  Respiratory: Diminished to auscultation bilaterally with coarse breath sounds, no wheezing, rales, rhonchi or crackles. Normal respiratory effort and patient is not tachypenic. No accessory muscle use. Unlabored breathing  Cardiovascular: RRR, no murmurs / rubs / gallops. S1 and S2 auscultated. No extremity edema.  Abdomen: Soft, non-tender, Slightly distended 2/2 body habitus. Bowel sounds positive.  GU: Deferred. Musculoskeletal: No clubbing / cyanosis of digits/nails. No joint deformity upper and lower extremities.  Skin: No rashes, lesions, ulcers on a limited skin evaluation. No induration; Warm and dry.  Neurologic: CN 2-12 grossly intact with no focal deficits but has a slight facial  droop and has some left upper extremity weakness Psychiatric: Normal judgment and insight.  She is awake and alert and oriented x2  Data Reviewed: I have personally reviewed following labs and imaging studies  CBC: Recent Labs  Lab 10/25/21 1540 10/26/21 0225 10/27/21 0234 10/28/21 0230 10/29/21 0342 10/30/21 0449 10/31/21 0108  WBC 5.7 5.8 7.2 9.0 8.2 9.3 10.5  NEUTROABS 3.7 5.1 6.1 7.3  --   --  8.5*  HGB 14.1 14.5 14.4 15.4* 14.6 14.2 13.9  HCT 43.1 42.6 41.4 45.0 42.8 42.0 40.5  MCV 91.7 89.9 88.1 88.6 89.9 89.9 90.0  PLT 146* 139* 151 159 164 156 235*   Basic Metabolic Panel: Recent Labs  Lab 10/26/21 0225 10/27/21 0234 10/28/21 0230 10/29/21 0342 10/30/21 0449 10/31/21 0108  NA 138 137 138 138 138 135  K 3.9 4.2 4.2 4.7 4.5 4.7  CL 104 102 103 100 102 99  CO2 '22 22 24 24 26 26  '$ GLUCOSE 251* 275* 184* 228* 221* 277*  BUN 17 30* 39* 44* 45* 48*  CREATININE 0.96 1.30* 1.32* 1.43* 1.22* 1.43*  CALCIUM 9.2 9.8 10.1 9.6 9.1 9.0  MG 1.8  --   --   --   --  2.3  PHOS 2.8  --   --   --   --   --    GFR: Estimated Creatinine Clearance: 31.1 mL/min (A) (by C-G formula based on SCr of 1.43 mg/dL (H)). Liver Function Tests: Recent Labs  Lab 10/25/21 1540 10/26/21 0225  AST 12* 15  ALT 11 14  ALKPHOS 51 51  BILITOT 0.5 0.8  PROT 6.9 6.7  ALBUMIN 4.1 3.6   No results for input(s): "LIPASE", "AMYLASE" in the last 168 hours. No results for input(s): "AMMONIA" in the last 168 hours. Coagulation Profile: Recent Labs  Lab 10/25/21 1540 10/30/21 0449  INR 1.1 1.2   Cardiac Enzymes: No results for input(s): "CKTOTAL", "CKMB", "CKMBINDEX", "TROPONINI" in the last 168 hours. BNP (last 3 results) No results for input(s): "PROBNP" in the last 8760 hours. HbA1C: No results for input(s): "HGBA1C" in the last 72 hours. CBG: Recent Labs  Lab 10/30/21 1607 10/30/21 2122 10/31/21 0735 10/31/21 1139 10/31/21 1608  GLUCAP 254* 355* 193* 187* 351*   Lipid  Profile: No results for input(s): "CHOL", "HDL", "LDLCALC", "TRIG", "CHOLHDL", "LDLDIRECT" in the last 72 hours. Thyroid Function Tests: No results for input(s): "TSH", "T4TOTAL", "FREET4", "T3FREE", "THYROIDAB" in the last 72 hours. Anemia Panel: No results for input(s): "VITAMINB12", "FOLATE", "FERRITIN", "TIBC", "IRON", "RETICCTPCT" in the last 72 hours. Sepsis Labs: No results for input(s): "PROCALCITON", "LATICACIDVEN" in the last 168 hours.  Recent Results (from the past 240 hour(s))  Urine Culture     Status: Abnormal   Collection Time: 10/27/21  9:10 AM   Specimen: Urine, Catheterized  Result Value Ref Range Status   Specimen Description URINE, CATHETERIZED  Final   Special Requests   Final    NONE Performed at [facility], [address], [place], [place] [address]    Culture >=100,000 COLONIES/mL ESCHERICHIA COLI (A)  Final   Report Status 10/29/2021 FINAL  Final   Organism ID, Bacteria ESCHERICHIA COLI (A)  Final      Susceptibility   Escherichia coli - MIC*    AMPICILLIN 8 SENSITIVE Sensitive     CEFAZOLIN <=4 SENSITIVE Sensitive     CEFEPIME <=0.12 SENSITIVE Sensitive     CEFTRIAXONE <=0.25 SENSITIVE Sensitive     CIPROFLOXACIN <=0.25 SENSITIVE Sensitive     GENTAMICIN <=1 SENSITIVE Sensitive     IMIPENEM <=0.25 SENSITIVE Sensitive     NITROFURANTOIN 32 SENSITIVE Sensitive     TRIMETH/SULFA <=20 SENSITIVE Sensitive     AMPICILLIN/SULBACTAM 8 SENSITIVE Sensitive     PIP/TAZO <=4 SENSITIVE Sensitive     * >=100,000 COLONIES/mL ESCHERICHIA COLI     Radiology Studies: No results found.   Scheduled Meds:  aspirin EC  81 mg Oral Daily   calcium carbonate  1,250 mg Oral Q breakfast   clopidogrel  75 mg Oral Daily   dexamethasone (DECADRON) injection  4 mg Intravenous Q12H   fenofibrate  160 mg Oral QPC breakfast   folic acid  1 mg Oral Daily   insulin aspart  0-15 Units Subcutaneous TID WC   insulin aspart  0-5 Units Subcutaneous QHS   insulin  glargine-yfgn  38 Units Subcutaneous Daily   irbesartan  150 mg Oral Daily   pantoprazole  40 mg Oral Daily   rosuvastatin  20 mg Oral Daily   Continuous Infusions:   LOS: 5 days   Raiford Noble, DO [facility] Available via Epic secure chat 7am-7pm After these hours, please refer to coverage provider listed on [email or web address] 10/31/2021, 4:11 PM

## 2021-10-31 NOTE — Progress Notes (Signed)
STROKE TEAM PROGRESS NOTE   INTERVAL HISTORY No family is at the bedside. Pt seen in chair, awake alert, talkative, symptoms not resolved, no significant facial droop, left upper extremity only slight pronator drift.  Pending bilaterally stenting/angioplasty tomorrow.  Vitals:   10/30/21 2300 10/31/21 0305 10/31/21 0736 10/31/21 1140  BP: (!) 153/82 (!) 152/75 (!) 229/99 (!) 163/69  Pulse: 78 66 71 87  Resp: 20 (!) 23 19 (!) 22  Temp: 97.8 F (36.6 C) 97.8 F (36.6 C) 98.5 F (36.9 C) 98 F (36.7 C)  TempSrc: Oral Axillary Oral Oral  SpO2: 96% 95% 96% 90%  Weight:      Height:       CBC:  Recent Labs  Lab 10/28/21 0230 10/29/21 0342 10/30/21 0449 10/31/21 0108  WBC 9.0   < > 9.3 10.5  NEUTROABS 7.3  --   --  8.5*  HGB 15.4*   < > 14.2 13.9  HCT 45.0   < > 42.0 40.5  MCV 88.6   < > 89.9 90.0  PLT 159   < > 156 137*   < > = values in this interval not displayed.   Basic Metabolic Panel:  Recent Labs  Lab 10/26/21 0225 10/27/21 0234 10/30/21 0449 10/31/21 0108  NA 138   < > 138 135  K 3.9   < > 4.5 4.7  CL 104   < > 102 99  CO2 22   < > 26 26  GLUCOSE 251*   < > 221* 277*  BUN 17   < > 45* 48*  CREATININE 0.96   < > 1.22* 1.43*  CALCIUM 9.2   < > 9.1 9.0  MG 1.8  --   --  2.3  PHOS 2.8  --   --   --    < > = values in this interval not displayed.   Lipid Panel:  Recent Labs  Lab 10/27/21 0234  CHOL 198  TRIG 59  HDL 53  CHOLHDL 3.7  VLDL 12  LDLCALC 133*   HgbA1c:  Recent Labs  Lab 10/26/21 0225  HGBA1C 7.0*   Urine Drug Screen: No results for input(s): "LABOPIA", "COCAINSCRNUR", "LABBENZ", "AMPHETMU", "THCU", "LABBARB" in the last 168 hours.  Alcohol Level  Recent Labs  Lab 10/25/21 1540  ETH <10    IMAGING past 24 hours No results found.  PHYSICAL EXAM General:  Alert, well-nourished, well-developed patient in no acute distress Respiratory:  Regular, unlabored respirations on room air  NEURO:  awake, alert, eyes open, orientated to  age, place, month and people, but not to year. No aphasia, fluent language, following all simple commands, mild dysarthria. Able to name and repeat. No gaze palsy, tracking bilaterally, visual field full.  Mild left facial droop. Tongue midline. RUE 5/5, LUE 4+/5 with mild pronator drift. Bilaterally LEs 4/5 proximal and distal. Sensation symmetrical bilaterally, b/l FTN intact but slow, gait not tested.    ASSESSMENT/PLAN [name] is a 81 y.o. female with history of  HTN, DM, CKD3, GERD and endometrial cancer  presenting with slurred speech for two days and left leg weakness for one day.  Patient's son states that she first seemed to be having problems on 7/4 when she had some difficulty with ambulation, then developed slurred speech and dragging of the left leg while walking.  [name] shows a left paracentral meningioma with vasogenic edema, and MRI brain shows a right paramedian pontine stroke. Patient denies any prior known history  of meningioma or brain problems.  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  Therapy recommendations:  CIR Disposition:  pending  Basilar artery stenosis CTA head & neck marked stenosis of BA proximal to SCAs Per son, pt baseline walks with walker due to left hip and b/l knee replacement but lives independently PTA Now with mild  left facial droop and left UE drift post stroke Given high risk of recurrent stroke with devastating outcome, basilar artery angioplasty or stent planned tomorrow IR Dr. Estanislado Pandy on board  Left frontal large meningioma CT head 5.0 cm intracranial mass along anterior falx, likely to be a meningioma with vasogenic edema and 2 cm localized midline shift MRI left anterior cranial fossa meningioma with vasogenic edema and MLS Neurosurgery on board Plan for possible surgical resection in 8-12 weeks first On decardron  Hypertension Home meds:  telmisartan 80 mg daily Stable but fluctuate On low dose avapro Avoid low BP Long-term BP goal normotensive  Hyperlipidemia Home meds:  fenofibrate 160 mg daily, resumed in hospital LDL 133, goal < 70 Add rosuvastatin 20 mg daily  Continue statin at discharge  Diabetes type II Uncontrolled Home meds:  metformin 1000 mg BID HgbA1c 7.0, goal < 7.0 CBGs SSI On insulin now Hyperglycemia continue to improve, especially on decardron now Close PCP follow up for better DM control in the setting of steroid use  Dysphagia Speech on board Now on nectar thick liquid Aspiration precaution  Other Stroke Risk Factors Advanced Age >/= 16  Former cigarette smoker  Other Active Problems Hx of left hip and b/l Knee replacement   Hospital day # 5   Rosalin Hawking, MD PhD Stroke Neurology 10/31/2021 2:23 PM    To contact Stroke Continuity provider, please refer to [email or web address] After hours, contact General Neurology

## 2021-10-31 NOTE — Plan of Care (Signed)
  Problem: Education: Goal: Knowledge of disease or condition will improve Outcome: Progressing Goal: Knowledge of secondary prevention will improve (SELECT ALL) Outcome: Progressing Goal: Knowledge of patient specific risk factors will improve (INDIVIDUALIZE FOR PATIENT) Outcome: Progressing Goal: Individualized Educational Video(s) Outcome: Progressing   Problem: Coping: Goal: Will verbalize positive feelings about self Outcome: Progressing Goal: Will identify appropriate support needs Outcome: Progressing   Problem: Health Behavior/Discharge Planning: Goal: Ability to manage health-related needs will improve Outcome: Progressing   Problem: Self-Care: Goal: Ability to participate in self-care as condition permits will improve Outcome: Progressing Goal: Verbalization of feelings and concerns over difficulty with self-care will improve Outcome: Progressing Goal: Ability to communicate needs accurately will improve Outcome: Progressing   Problem: Nutrition: Goal: Risk of aspiration will decrease Outcome: Progressing Goal: Dietary intake will improve Outcome: Progressing

## 2021-11-01 ENCOUNTER — Inpatient Hospital Stay (HOSPITAL_COMMUNITY): Payer: Medicare Other | Admitting: Certified Registered"

## 2021-11-01 ENCOUNTER — Encounter (HOSPITAL_COMMUNITY): Payer: Self-pay | Admitting: Family Medicine

## 2021-11-01 ENCOUNTER — Inpatient Hospital Stay (HOSPITAL_BASED_OUTPATIENT_CLINIC_OR_DEPARTMENT_OTHER): Payer: Medicare Other

## 2021-11-01 ENCOUNTER — Inpatient Hospital Stay (HOSPITAL_COMMUNITY): Payer: Medicare Other

## 2021-11-01 ENCOUNTER — Encounter (HOSPITAL_COMMUNITY)
Admission: EM | Disposition: A | Discharge status: Rehabilitation Facility | Payer: Self-pay | Source: Home / Self Care | Attending: Orthopedic Surgery

## 2021-11-01 ENCOUNTER — Other Ambulatory Visit (HOSPITAL_COMMUNITY): Payer: Self-pay

## 2021-11-01 ENCOUNTER — Other Ambulatory Visit (HOSPITAL_COMMUNITY): Payer: Self-pay | Admitting: Interventional Radiology

## 2021-11-01 DIAGNOSIS — I10 Essential (primary) hypertension: Secondary | ICD-10-CM

## 2021-11-01 DIAGNOSIS — E1122 Type 2 diabetes mellitus with diabetic chronic kidney disease: Secondary | ICD-10-CM | POA: Diagnosis not present

## 2021-11-01 DIAGNOSIS — I651 Occlusion and stenosis of basilar artery: Secondary | ICD-10-CM

## 2021-11-01 DIAGNOSIS — I63531 Cerebral infarction due to unspecified occlusion or stenosis of right posterior cerebral artery: Secondary | ICD-10-CM | POA: Diagnosis not present

## 2021-11-01 DIAGNOSIS — Z87891 Personal history of nicotine dependence: Secondary | ICD-10-CM | POA: Diagnosis not present

## 2021-11-01 DIAGNOSIS — G9389 Other specified disorders of brain: Secondary | ICD-10-CM | POA: Diagnosis not present

## 2021-11-01 DIAGNOSIS — I6322 Cerebral infarction due to unspecified occlusion or stenosis of basilar arteries: Secondary | ICD-10-CM

## 2021-11-01 DIAGNOSIS — N1832 Chronic kidney disease, stage 3b: Secondary | ICD-10-CM | POA: Diagnosis not present

## 2021-11-01 DIAGNOSIS — R900 Intracranial space-occupying lesion found on diagnostic imaging of central nervous system: Secondary | ICD-10-CM | POA: Diagnosis not present

## 2021-11-01 HISTORY — PX: IR US GUIDE VASC ACCESS RIGHT: IMG2390

## 2021-11-01 HISTORY — PX: IR CT HEAD LTD: IMG2386

## 2021-11-01 HISTORY — PX: IR INTRA CRAN STENT: IMG2345

## 2021-11-01 HISTORY — PX: RADIOLOGY WITH ANESTHESIA: SHX6223

## 2021-11-01 LAB — COMPREHENSIVE METABOLIC PANEL
ALT: 10 U/L (ref 0–44)
AST: 12 U/L — ABNORMAL LOW (ref 15–41)
Albumin: 2.8 g/dL — ABNORMAL LOW (ref 3.5–5.0)
Alkaline Phosphatase: 59 U/L (ref 38–126)
Anion gap: 10 (ref 5–15)
BUN: 55 mg/dL — ABNORMAL HIGH (ref 8–23)
CO2: 25 mmol/L (ref 22–32)
Calcium: 9.1 mg/dL (ref 8.9–10.3)
Chloride: 99 mmol/L (ref 98–111)
Creatinine, Ser: 1.69 mg/dL — ABNORMAL HIGH (ref 0.44–1.00)
GFR, Estimated: 30 mL/min — ABNORMAL LOW (ref >60)
Glucose, Bld: 265 mg/dL — ABNORMAL HIGH (ref 70–99)
Potassium: 4.9 mmol/L (ref 3.5–5.1)
Sodium: 134 mmol/L — ABNORMAL LOW (ref 135–145)
Total Bilirubin: 0.4 mg/dL (ref 0.3–1.2)
Total Protein: 5.8 g/dL — ABNORMAL LOW (ref 6.5–8.1)

## 2021-11-01 LAB — GLUCOSE, CAPILLARY
Glucose-Capillary: 199 mg/dL — ABNORMAL HIGH (ref 70–99)
Glucose-Capillary: 211 mg/dL — ABNORMAL HIGH (ref 70–99)
Glucose-Capillary: 127 mg/dL — ABNORMAL HIGH (ref 70–99)
Glucose-Capillary: 104 mg/dL — ABNORMAL HIGH (ref 70–99)
Glucose-Capillary: 196 mg/dL — ABNORMAL HIGH (ref 70–99)

## 2021-11-01 LAB — CBC WITH DIFFERENTIAL/PLATELET
Abs Immature Granulocytes: 0.07 10*3/uL (ref 0.00–0.07)
Abs Immature Granulocytes: 0.07 10*3/uL (ref 0.00–0.07)
Basophils Absolute: 0 10*3/uL (ref 0.0–0.1)
Basophils Absolute: 0 10*3/uL (ref 0.0–0.1)
Basophils Relative: 0 %
Basophils Relative: 0 %
Eosinophils Absolute: 0 10*3/uL (ref 0.0–0.5)
Eosinophils Absolute: 0 10*3/uL (ref 0.0–0.5)
Eosinophils Relative: 0 %
Eosinophils Relative: 0 %
HCT: 39.7 % (ref 36.0–46.0)
HCT: 39.4 % (ref 36.0–46.0)
Hemoglobin: 13.6 g/dL (ref 12.0–15.0)
Hemoglobin: 13.1 g/dL (ref 12.0–15.0)
Immature Granulocytes: 1 %
Immature Granulocytes: 1 %
Lymphocytes Relative: 9 %
Lymphocytes Relative: 9 %
Lymphs Abs: 0.9 10*3/uL (ref 0.7–4.0)
Lymphs Abs: 0.8 10*3/uL (ref 0.7–4.0)
MCH: 30.6 pg (ref 26.0–34.0)
MCH: 30.8 pg (ref 26.0–34.0)
MCHC: 34.3 g/dL (ref 30.0–36.0)
MCHC: 33.2 g/dL (ref 30.0–36.0)
MCV: 89.2 fL (ref 80.0–100.0)
MCV: 92.5 fL (ref 80.0–100.0)
Monocytes Absolute: 0.8 10*3/uL (ref 0.1–1.0)
Monocytes Absolute: 0.6 10*3/uL (ref 0.1–1.0)
Monocytes Relative: 8 %
Monocytes Relative: 7 %
Neutro Abs: 8.2 10*3/uL — ABNORMAL HIGH (ref 1.7–7.7)
Neutro Abs: 7.2 10*3/uL (ref 1.7–7.7)
Neutrophils Relative %: 82 %
Neutrophils Relative %: 83 %
Platelets: 148 10*3/uL — ABNORMAL LOW (ref 150–400)
Platelets: 136 10*3/uL — ABNORMAL LOW (ref 150–400)
RBC: 4.45 MIL/uL (ref 3.87–5.11)
RBC: 4.26 MIL/uL (ref 3.87–5.11)
RDW: 14.6 % (ref 11.5–15.5)
RDW: 14.8 % (ref 11.5–15.5)
WBC: 9.9 10*3/uL (ref 4.0–10.5)
WBC: 8.7 10*3/uL (ref 4.0–10.5)
nRBC: 0 % (ref 0.0–0.2)
nRBC: 0.2 % (ref 0.0–0.2)

## 2021-11-01 LAB — POCT I-STAT 7, (LYTES, BLD GAS, ICA,H+H)
Acid-base deficit: 1 mmol/L (ref 0.0–2.0)
Bicarbonate: 24.7 mmol/L (ref 20.0–28.0)
Calcium, Ion: 1.2 mmol/L (ref 1.15–1.40)
HCT: 38 % (ref 36.0–46.0)
Hemoglobin: 12.9 g/dL (ref 12.0–15.0)
O2 Saturation: 99 %
Potassium: 4.5 mmol/L (ref 3.5–5.1)
Sodium: 139 mmol/L (ref 135–145)
TCO2: 26 mmol/L (ref 22–32)
pCO2 arterial: 42.4 mmHg (ref 32–48)
pH, Arterial: 7.372 (ref 7.35–7.45)
pO2, Arterial: 141 mmHg — ABNORMAL HIGH (ref 83–108)

## 2021-11-01 LAB — MAGNESIUM: Magnesium: 2.2 mg/dL (ref 1.7–2.4)

## 2021-11-01 LAB — TYPE AND SCREEN
ABO/RH(D): O POS
Antibody Screen: NEGATIVE

## 2021-11-01 LAB — PHOSPHORUS: Phosphorus: 3.6 mg/dL (ref 2.5–4.6)

## 2021-11-01 LAB — PROTIME-INR
INR: 1.2 (ref 0.8–1.2)
Prothrombin Time: 15.4 seconds — ABNORMAL HIGH (ref 11.4–15.2)

## 2021-11-01 SURGERY — IR WITH ANESTHESIA
Anesthesia: General

## 2021-11-01 MED ORDER — EPTIFIBATIDE 20 MG/10ML IV SOLN
INTRAVENOUS | Status: AC
  Filled 2021-11-01: qty 10

## 2021-11-01 MED ORDER — CHLORHEXIDINE GLUCONATE 0.12 % MT SOLN: 15.0000 mL | Freq: Once | OROMUCOSAL | Status: AC

## 2021-11-01 MED ORDER — ORAL CARE MOUTH RINSE: 15.0000 mL | Freq: Once | OROMUCOSAL | Status: AC

## 2021-11-01 MED ORDER — CLOPIDOGREL BISULFATE 75 MG PO TABS
75.0000 mg | ORAL_TABLET | Freq: Every day | ORAL | Status: DC
  Administered 2021-11-03 – 2021-11-05 (×3): 75 mg via ORAL
  Filled 2021-11-01 (×3): qty 1

## 2021-11-01 MED ORDER — ACETAMINOPHEN 325 MG PO TABS
650.0000 mg | ORAL_TABLET | ORAL | Status: DC | PRN
  Administered 2021-11-02 – 2021-11-05 (×2): 650 mg via ORAL
  Filled 2021-11-01 (×2): qty 2

## 2021-11-01 MED ORDER — PROPOFOL 500 MG/50ML IV EMUL
INTRAVENOUS | Status: DC | PRN
  Administered 2021-11-01: 50 ug/kg/min via INTRAVENOUS

## 2021-11-01 MED ORDER — HEPARIN (PORCINE) 25000 UT/250ML-% IV SOLN: 500.0000 [IU]/h | INTRAVENOUS | Status: DC

## 2021-11-01 MED ORDER — HEPARIN (PORCINE) 25000 UT/250ML-% IV SOLN
500.0000 [IU]/h | INTRAVENOUS | Status: DC
  Administered 2021-11-01: 500 [IU]/h via INTRAVENOUS
  Filled 2021-11-01: qty 250

## 2021-11-01 MED ORDER — ORAL CARE MOUTH RINSE
15.0000 mL | OROMUCOSAL | Status: DC
  Administered 2021-11-01 – 2021-11-02 (×9): 15 mL via OROMUCOSAL

## 2021-11-01 MED ORDER — FENTANYL CITRATE (PF) 100 MCG/2ML IJ SOLN
INTRAMUSCULAR | Status: AC
  Filled 2021-11-01: qty 2

## 2021-11-01 MED ORDER — CHLORHEXIDINE GLUCONATE CLOTH 2 % EX PADS
6.0000 | MEDICATED_PAD | Freq: Every day | CUTANEOUS | Status: DC
  Administered 2021-11-02 – 2021-11-05 (×5): 6 via TOPICAL

## 2021-11-01 MED ORDER — SODIUM CHLORIDE 0.9 % IV SOLN: INTRAVENOUS | Status: DC

## 2021-11-01 MED ORDER — FENTANYL CITRATE PF 50 MCG/ML IJ SOSY: 25.0000 ug | PREFILLED_SYRINGE | INTRAMUSCULAR | Status: DC | PRN

## 2021-11-01 MED ORDER — PANTOPRAZOLE 2 MG/ML SUSPENSION
40.0000 mg | Freq: Every day | ORAL | Status: DC
Start: 2021-11-01 — End: 2021-11-01

## 2021-11-01 MED ORDER — SODIUM CHLORIDE 0.9 % IV BOLUS
250.0000 mL | Freq: Once | INTRAVENOUS | Status: AC
Start: 2021-11-01 — End: 2021-11-01
  Administered 2021-11-01: 250 mL via INTRAVENOUS

## 2021-11-01 MED ORDER — PHENYLEPHRINE HCL-NACL 20-0.9 MG/250ML-% IV SOLN: 0.0000 ug/min | INTRAVENOUS | Status: DC

## 2021-11-01 MED ORDER — PROPOFOL 1000 MG/100ML IV EMUL
0.0000 ug/kg/min | INTRAVENOUS | Status: DC
  Administered 2021-11-01: 30 ug/kg/min via INTRAVENOUS
  Administered 2021-11-02: 40 ug/kg/min via INTRAVENOUS
  Filled 2021-11-01 (×2): qty 100

## 2021-11-01 MED ORDER — CLEVIDIPINE BUTYRATE 0.5 MG/ML IV EMUL
0.0000 mg/h | INTRAVENOUS | Status: AC
  Administered 2021-11-01 (×2): 2 mg/h via INTRAVENOUS
  Administered 2021-11-02: 6 mg/h via INTRAVENOUS
  Administered 2021-11-02: 21 mg/h via INTRAVENOUS
  Administered 2021-11-02: 15 mg/h via INTRAVENOUS
  Administered 2021-11-02: 21 mg/h via INTRAVENOUS
  Administered 2021-11-02: 10 mg/h via INTRAVENOUS
  Filled 2021-11-01: qty 50
  Filled 2021-11-01: qty 100
  Filled 2021-11-01 (×2): qty 50
  Filled 2021-11-01: qty 100
  Filled 2021-11-01: qty 50

## 2021-11-01 MED ORDER — LIDOCAINE 2% (20 MG/ML) 5 ML SYRINGE
INTRAMUSCULAR | Status: DC | PRN
  Administered 2021-11-01: 60 mg via INTRAVENOUS

## 2021-11-01 MED ORDER — DOCUSATE SODIUM 50 MG/5ML PO LIQD
100.0000 mg | Freq: Two times a day (BID) | ORAL | Status: DC
  Administered 2021-11-01 – 2021-11-02 (×3): 100 mg
  Filled 2021-11-01 (×3): qty 10

## 2021-11-01 MED ORDER — PROTAMINE SULFATE 10 MG/ML IV SOLN
INTRAVENOUS | Status: DC | PRN
  Administered 2021-11-01: 5 mg via INTRAVENOUS

## 2021-11-01 MED ORDER — ROCURONIUM BROMIDE 10 MG/ML (PF) SYRINGE
PREFILLED_SYRINGE | INTRAVENOUS | Status: DC | PRN
  Administered 2021-11-01: 30 mg via INTRAVENOUS
  Administered 2021-11-01: 40 mg via INTRAVENOUS
  Administered 2021-11-01: 20 mg via INTRAVENOUS
  Administered 2021-11-01: 30 mg via INTRAVENOUS
  Administered 2021-11-01: 20 mg via INTRAVENOUS
  Administered 2021-11-01: 60 mg via INTRAVENOUS

## 2021-11-01 MED ORDER — INSULIN ASPART 100 UNIT/ML IJ SOLN
0.0000 [IU] | INTRAMUSCULAR | Status: DC
  Administered 2021-11-01: 3 [IU] via SUBCUTANEOUS
  Administered 2021-11-02 (×2): 2 [IU] via SUBCUTANEOUS
  Administered 2021-11-02: 3 [IU] via SUBCUTANEOUS

## 2021-11-01 MED ORDER — CLOPIDOGREL BISULFATE 75 MG PO TABS
75.0000 mg | ORAL_TABLET | Freq: Every day | ORAL | Status: DC
  Administered 2021-11-02: 75 mg
  Filled 2021-11-01: qty 1

## 2021-11-01 MED ORDER — VERAPAMIL HCL 2.5 MG/ML IV SOLN
INTRA_ARTERIAL | Status: DC | PRN
  Administered 2021-11-01: 7 mL via INTRA_ARTERIAL

## 2021-11-01 MED ORDER — PROPOFOL 10 MG/ML IV BOLUS
INTRAVENOUS | Status: DC | PRN
  Administered 2021-11-01: 120 mg via INTRAVENOUS
  Administered 2021-11-01: 30 mg via INTRAVENOUS

## 2021-11-01 MED ORDER — FENTANYL CITRATE PF 50 MCG/ML IJ SOSY
25.0000 ug | PREFILLED_SYRINGE | INTRAMUSCULAR | Status: DC | PRN
  Administered 2021-11-02: 50 ug via INTRAVENOUS
  Filled 2021-11-01: qty 1

## 2021-11-01 MED ORDER — IOHEXOL 300 MG/ML  SOLN
200.0000 mL | Freq: Once | INTRAMUSCULAR | Status: AC | PRN
  Administered 2021-11-01: 55 mL via INTRA_ARTERIAL

## 2021-11-01 MED ORDER — PANTOPRAZOLE 2 MG/ML SUSPENSION
40.0000 mg | Freq: Every day | ORAL | Status: DC
  Administered 2021-11-02: 40 mg
  Filled 2021-11-01: qty 20

## 2021-11-01 MED ORDER — LIDOCAINE HCL 1 % IJ SOLN
INTRAMUSCULAR | Status: AC
  Filled 2021-11-01: qty 20

## 2021-11-01 MED ORDER — DEXAMETHASONE SODIUM PHOSPHATE 10 MG/ML IJ SOLN
INTRAMUSCULAR | Status: DC | PRN
  Administered 2021-11-01: 5 mg via INTRAVENOUS

## 2021-11-01 MED ORDER — ASPIRIN 81 MG PO CHEW
81.0000 mg | CHEWABLE_TABLET | Freq: Every day | ORAL | Status: DC
  Administered 2021-11-03 – 2021-11-05 (×3): 81 mg via ORAL
  Filled 2021-11-01 (×3): qty 1

## 2021-11-01 MED ORDER — IOHEXOL 300 MG/ML  SOLN
100.0000 mL | Freq: Once | INTRAMUSCULAR | Status: AC | PRN
Start: 2021-11-01 — End: 2021-11-01
  Administered 2021-11-01: 30 mL via INTRA_ARTERIAL

## 2021-11-01 MED ORDER — ASPIRIN 81 MG PO CHEW
81.0000 mg | CHEWABLE_TABLET | Freq: Every day | ORAL | Status: DC
  Administered 2021-11-02: 81 mg
  Filled 2021-11-01: qty 1

## 2021-11-01 MED ORDER — PHENYLEPHRINE 80 MCG/ML (10ML) SYRINGE FOR IV PUSH (FOR BLOOD PRESSURE SUPPORT)
PREFILLED_SYRINGE | INTRAVENOUS | Status: DC | PRN
  Administered 2021-11-01: 80 ug via INTRAVENOUS
  Administered 2021-11-01: 40 ug via INTRAVENOUS
  Administered 2021-11-01 (×2): 80 ug via INTRAVENOUS
  Administered 2021-11-01: 40 ug via INTRAVENOUS

## 2021-11-01 MED ORDER — VERAPAMIL HCL 2.5 MG/ML IV SOLN
INTRAVENOUS | Status: AC
  Filled 2021-11-01: qty 2

## 2021-11-01 MED ORDER — PROPOFOL 1000 MG/100ML IV EMUL
INTRAVENOUS | Status: AC
  Administered 2021-11-01: 50 ug/kg/min via INTRAVENOUS
  Filled 2021-11-01: qty 100

## 2021-11-01 MED ORDER — CLEVIDIPINE BUTYRATE 0.5 MG/ML IV EMUL
INTRAVENOUS | Status: AC
  Filled 2021-11-01: qty 50

## 2021-11-01 MED ORDER — CEFAZOLIN SODIUM-DEXTROSE 2-3 GM-%(50ML) IV SOLR
INTRAVENOUS | Status: DC | PRN
  Administered 2021-11-01: 2 g via INTRAVENOUS

## 2021-11-01 MED ORDER — HEPARIN SODIUM (PORCINE) 1000 UNIT/ML IJ SOLN
INTRAMUSCULAR | Status: AC
  Filled 2021-11-01: qty 10

## 2021-11-01 MED ORDER — POLYETHYLENE GLYCOL 3350 17 G PO PACK: 17.0000 g | PACK | Freq: Two times a day (BID) | ORAL | Status: DC

## 2021-11-01 MED ORDER — SENNOSIDES-DOCUSATE SODIUM 8.6-50 MG PO TABS: 1.0000 | ORAL_TABLET | Freq: Two times a day (BID) | ORAL | Status: DC

## 2021-11-01 MED ORDER — CHLORHEXIDINE GLUCONATE 0.12 % MT SOLN
OROMUCOSAL | Status: AC
  Administered 2021-11-01: 15 mL via OROMUCOSAL
  Filled 2021-11-01: qty 15

## 2021-11-01 MED ORDER — ONDANSETRON HCL 4 MG/2ML IJ SOLN
INTRAMUSCULAR | Status: DC | PRN
  Administered 2021-11-01: 4 mg via INTRAVENOUS

## 2021-11-01 MED ORDER — ACETAMINOPHEN 650 MG RE SUPP: 650.0000 mg | RECTAL | Status: DC | PRN

## 2021-11-01 MED ORDER — PHENYLEPHRINE HCL-NACL 20-0.9 MG/250ML-% IV SOLN
INTRAVENOUS | Status: DC | PRN
  Administered 2021-11-01: 25 ug/min via INTRAVENOUS

## 2021-11-01 MED ORDER — NITROGLYCERIN 1 MG/10 ML FOR IR/CATH LAB
INTRA_ARTERIAL | Status: AC
  Filled 2021-11-01: qty 10

## 2021-11-01 MED ORDER — SODIUM CHLORIDE (PF) 0.9 % IJ SOLN
INTRAVENOUS | Status: DC | PRN
  Administered 2021-11-01: 200 ug via INTRA_ARTERIAL

## 2021-11-01 MED ORDER — POLYETHYLENE GLYCOL 3350 17 G PO PACK
17.0000 g | PACK | Freq: Every day | ORAL | Status: DC
  Administered 2021-11-02: 17 g
  Filled 2021-11-01: qty 1

## 2021-11-01 MED ORDER — CEFAZOLIN SODIUM-DEXTROSE 2-4 GM/100ML-% IV SOLN
INTRAVENOUS | Status: AC
  Filled 2021-11-01: qty 100

## 2021-11-01 MED ORDER — CLEVIDIPINE BUTYRATE 0.5 MG/ML IV EMUL: 0.0000 mg/h | INTRAVENOUS | Status: DC

## 2021-11-01 MED ORDER — ALPRAZOLAM 0.5 MG PO TABS: 0.2500 mg | ORAL_TABLET | Freq: Every evening | ORAL | Status: DC | PRN

## 2021-11-01 MED ORDER — HEPARIN SODIUM (PORCINE) 1000 UNIT/ML IJ SOLN
INTRAMUSCULAR | Status: DC | PRN
  Administered 2021-11-01: 1000 [IU] via INTRAVENOUS

## 2021-11-01 MED ORDER — INSULIN ASPART 100 UNIT/ML IJ SOLN: 0.0000 [IU] | INTRAMUSCULAR | Status: DC | PRN

## 2021-11-01 MED ORDER — ACETAMINOPHEN 160 MG/5ML PO SOLN: 650.0000 mg | ORAL | Status: DC | PRN

## 2021-11-01 MED ORDER — ORAL CARE MOUTH RINSE: 15.0000 mL | OROMUCOSAL | Status: DC | PRN

## 2021-11-01 MED ORDER — FENTANYL CITRATE (PF) 100 MCG/2ML IJ SOLN
INTRAMUSCULAR | Status: DC | PRN
  Administered 2021-11-01 (×2): 50 ug via INTRAVENOUS

## 2021-11-01 MED ORDER — LACTATED RINGERS IV SOLN: INTRAVENOUS | Status: DC

## 2021-11-01 MED ORDER — CLEVIDIPINE BUTYRATE 0.5 MG/ML IV EMUL
INTRAVENOUS | Status: DC | PRN
  Administered 2021-11-01: 2 mg/h via INTRAVENOUS

## 2021-11-01 NOTE — H&P (Signed)
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  She is scheduled for cerebral angiogram with intervention today with Dr. Estanislado Pandy.  She has been on aspirin and Plavix daily since admission.  Her is accompanied by her family (son) today who does provide consent.  All questions answered.  She is agreeable to proceed.   Risks and benefits of cerebral angiogram with intervention were discussed with the patient including, but not limited to bleeding, infection, vascular injury, contrast induced renal failure, stroke or even death.  This interventional procedure involves the use of X-rays and because of the nature of the planned procedure, it is possible that we will have prolonged use of X-ray fluoroscopy.  Potential radiation risks to you include (but are not limited to) the following: - A slightly elevated risk for cancer  several years later in life. This risk is typically less than 0.5% percent. This risk is low in comparison to the normal incidence of human cancer, which is 33% for women and 50% for men according to the [place]. - Radiation induced injury can include skin redness, resembling a rash, tissue breakdown / ulcers and hair loss (which can be temporary or permanent).   The likelihood of either of these occurring depends on the difficulty of the procedure and whether you are sensitive to radiation due to previous procedures, disease, or genetic conditions.   IF your procedure requires a prolonged use of radiation, you will be notified and given written instructions for further action.  It is your responsibility to monitor the irradiated area for the 2 weeks following the procedure and to notify your physician if you are concerned that you have suffered a radiation induced injury.    All of the patient's questions were answered, patient is  agreeable to proceed.  Consent signed and in chart.  ______________________________________________________________________   History of Present Illness: Cassandra Dodson is a 81 y.o. female with history of HTN, DM, CKD3, GERD, endometrial cancer who presented with slurred speech and left leg weakness.  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  NIR consulted for basilar artery angiogram with intervention. Case reviewed and approved by Dr. Estanislado Pandy.   Allergies and medications reviewed.   Review of Systems: A 12 point ROS discussed and pertinent positives are indicated in the HPI above.  All other systems are negative.  Review of Systems  Constitutional:  Negative for fatigue and fever.  Respiratory:  Negative for cough and shortness of breath.   Cardiovascular:  Negative for chest pain.  Gastrointestinal:  Negative for abdominal pain, rectal pain and vomiting.  Genitourinary:  Negative for dysuria.  Musculoskeletal:  Negative for back pain.  Neurological:  Positive for weakness. Negative for dizziness, facial asymmetry and headaches.  Psychiatric/Behavioral:  Negative for behavioral problems and confusion.     Vital Signs: BP (!) 214/89   Pulse 75   Temp 98.5 F (36.9 C) (Oral)   Resp 18   Ht '5\' 5"'$  (1.651 m)   Wt 169 lb 1.5 oz (76.7 kg)   SpO2 95%   BMI 28.14 kg/m   Physical Exam Vitals and nursing note reviewed.  Constitutional:      General: She is not in acute distress.    Appearance: Normal appearance. She is not ill-appearing.  HENT:     Mouth/Throat:  Mouth: Mucous membranes are moist.     Pharynx: Oropharynx is clear.  Cardiovascular:     Rate and Rhythm: Normal rate and regular rhythm.  Pulmonary:     Effort: Pulmonary effort is normal.     Breath sounds: Normal breath sounds.  Skin:    General: Skin is warm and dry.  Neurological:     General: No focal deficit present.     Mental Status: She is  alert and oriented to person, place, and time. Mental status is at baseline.  Psychiatric:        Mood and Affect: Mood normal.        Behavior: Behavior normal.        Thought Content: Thought content normal.        Judgment: Judgment normal.      Imaging reviewed.   Labs:  COAGS: Recent Labs    10/25/21 1540 10/30/21 0449  INR 1.1 1.2  APTT 25  --     BMP: Recent Labs    10/29/21 0342 10/30/21 0449 10/31/21 0108 11/01/21 0309  NA 138 138 135 134*  K 4.7 4.5 4.7 4.9  CL 100 102 99 99  CO2 '24 26 26 25  '$ GLUCOSE 228* 221* 277* 265*  BUN 44* 45* 48* 55*  CALCIUM 9.6 9.1 9.0 9.1  CREATININE 1.43* 1.22* 1.43* 1.69*  GFRNONAA 37* 44* 37* 30*       Electronically Signed: Docia Barrier, PA 11/01/2021, 11:16 AM   I spent a total of 15 minutes in face to face in clinical consultation, greater than 50% of which was counseling/coordinating care for venous access.

## 2021-11-01 NOTE — Progress Notes (Signed)
Received a call from Dr. Luanne Bras about this patient.  He states that her procedure went well but after the procedure she started having some diffuse extremity swelling.  Dr. Estanislado Pandy kept the patient intubated today and consulted CCM for further evaluation.  PCCM is consulting on the patient and will assume care given that the patient is to remain intubated tonight per radiology's recommendation given concern for her diffuse bilateral forearm and lower extremity pitting edema. She will also get a Central Line to monitor patient's fluid intake as well. TRH will resume care when she is extubated and off the ventilator

## 2021-11-01 NOTE — Progress Notes (Incomplete)
STROKE TEAM PROGRESS NOTE   INTERVAL HISTORY    Vitals:   10/31/21 1949 10/31/21 2318 11/01/21 0300 11/01/21 0731  BP: (!) 155/58 132/71 (!) 145/80 (!) 176/115  Pulse: 99 88 71 67  Resp: 20 (!) 23 (!) 24 11  Temp: 98.7 F (37.1 C) 98.4 F (36.9 C) 97.7 F (36.5 C) 98.2 F (36.8 C)  TempSrc: Oral Oral Oral Oral  SpO2: 95% 95% 94% 94%  Weight:      Height:       CBC:  Recent Labs  Lab 10/31/21 0108 11/01/21 0309  WBC 10.5 9.9  NEUTROABS 8.5* 8.2*  HGB 13.9 13.6  HCT 40.5 39.7  MCV 90.0 89.2  PLT 137* 867*   Basic Metabolic Panel:  Recent Labs  Lab 10/26/21 0225 10/27/21 0234 10/31/21 0108 11/01/21 0309  NA 138   < > 135 134*  K 3.9   < > 4.7 4.9  CL 104   < > 99 99  CO2 22   < > 26 25  GLUCOSE 251*   < > 277* 265*  BUN 17   < > 48* 55*  CREATININE 0.96   < > 1.43* 1.69*  CALCIUM 9.2   < > 9.0 9.1  MG 1.8  --  2.3 2.2  PHOS 2.8  --   --  3.6   < > = values in this interval not displayed.   Lipid Panel:  Recent Labs  Lab 10/27/21 0234  CHOL 198  TRIG 59  HDL 53  CHOLHDL 3.7  VLDL 12  LDLCALC 133*   HgbA1c:  Recent Labs  Lab 10/26/21 0225  HGBA1C 7.0*   Urine Drug Screen: No results for input(s): "LABOPIA", "COCAINSCRNUR", "LABBENZ", "AMPHETMU", "THCU", "LABBARB" in the last 168 hours.  Alcohol Level  Recent Labs  Lab 10/25/21 1540  ETH <10    IMAGING past 24 hours No results found.  PHYSICAL EXAM General:  Alert, well-nourished, well-developed patient in no acute distress Respiratory:  Regular, unlabored respirations on room air  NEURO:  awake, alert, eyes open, orientated to age, place, month and people, but not to year. No aphasia, fluent language, following all simple commands, mild dysarthria. Able to name and repeat. No gaze palsy, tracking bilaterally, visual field full.  Mild left facial droop. Tongue midline. RUE 5/5, LUE 4+/5 with mild pronator drift. Bilaterally LEs 4/5 proximal and distal. Sensation symmetrical bilaterally,  b/l FTN intact but slow, gait not tested.    ASSESSMENT/PLAN [name] is a 81 y.o. female with history of  HTN, DM, CKD3, GERD and endometrial cancer  presenting with slurred speech for two days and left leg weakness for one day.  Patient's son states that she first seemed to be having problems on 7/4 when she had some difficulty with ambulation, then developed slurred speech and dragging of the left leg while walking.  [place] shows a left paracentral meningioma with vasogenic edema, and MRI brain shows a right paramedian pontine stroke. Patient denies any prior known history of meningioma or brain problems.  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  Therapy recommendations:  CIR Disposition:  pending  Basilar artery stenosis CTA head & neck marked stenosis of BA proximal to SCAs Per son, pt baseline walks with walker due to left hip and b/l knee replacement but lives independently PTA Now with mild left facial droop and left UE drift post stroke Given high risk of recurrent stroke with devastating outcome, basilar artery angioplasty or stent planned for today 7/13. IR Dr. Estanislado Pandy on board  Left frontal large meningioma CT head 5.0 cm intracranial mass along anterior falx, likely to be a meningioma with vasogenic edema and 2 cm localized midline  shift MRI left anterior cranial fossa meningioma with vasogenic edema and MLS Neurosurgery on board Plan for possible surgical resection in 8-12 weeks first On decardron  Hypertension Home meds:  telmisartan 80 mg daily Stable but fluctuate On low dose avapro Avoid low BP Long-term BP goal normotensive  Hyperlipidemia Home meds:  fenofibrate 160 mg daily, resumed in hospital LDL 133, goal < 70 Add rosuvastatin 20 mg daily  Continue statin at discharge  Diabetes type II Uncontrolled Home meds:  metformin 1000 mg BID HgbA1c 7.0, goal < 7.0 CBGs SSI On insulin now Hyperglycemia continue to improve, especially on decardron now Close PCP follow up for better DM control in the setting of steroid use  Dysphagia Speech on board Now on nectar thick liquid Aspiration precaution  Other Stroke Risk Factors Advanced Age >/= 58  Former cigarette smoker  Other Active Problems Hx of left hip and b/l Knee replacement  Acute Renal Injury - Creatinine - 0.96 ->1.43 ->1.69 (consider IV hydration - consider avoiding Ace I and ARBs - Avapro) especially in light of contrast for IR procedure.  Hospital day # 6      To contact Stroke Continuity provider, please refer to [email or web address] After hours, contact General Neurology

## 2021-11-01 NOTE — Sedation Documentation (Addendum)
Pt developed hematoma at radial puncture site proximal to the TR Band. Approximately 3cm, marked. Dr. Estanislado Pandy aware and at bedside. Manual pressure being applied by Gilmore Laroche, RTR. Radial pulse Doppler + distal to the TR Band, good pleth on POx on RIGHT thumb. Hematoma expressed out and soft.

## 2021-11-01 NOTE — Progress Notes (Signed)
PROGRESS NOTE    Cassandra Dodson  DXI:[id number] DOB: 1940/08/15 DOA: 10/25/2021 PCP: Shon Baton, MD   Brief Narrative:  HPI per Dr. Irene Pap on 10/25/21  Cassandra Dodson is a 81 y.o. female with medical history significant for endometrial cancer, essential hypertension, type 2 diabetes, GERD, hypothyroidism, CKD 3B who initially presented to [facility] ED with complaints of slurred speech, left-sided weakness x2 days.  Associated with mild confusion.  No headache.  Noncontrast head CT done in the ED revealed 5.0 cm long axis intracranial mass highly likely to be a meningioma associated with 2.0 cm localized left to right midline shift with extensive vasogenic edema in the left frontal lobe.  EDP discussed the case with neurosurgery Dr. Ellene Route who recommended MRI brain with and without contrast and to add IV Decadron for the vasogenic edema.   Upon assessment at [facility], the patient is alert, follows commands, denies any headaches.  ED Course: Tmax 98.6.  BP 170/93, pulse 68, respiratory rate 29, saturation 96% on room air.  Lab studies significant for glucose 211, BUN 20, creatinine 1.09 GFR 51.  CBC essentially unremarkable except for platelet count of 146.  **Interim History   Neurology and both neurosurgery are following and neurosurgery recommending holding off any consideration of surgical intervention until she recovers from her stenting procedure and they feel that they may be just able to simply observe the meningioma.  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    PT/OT recommending CIR and the rehab admissions coordinator is following for potential CIR admitted pending medical readiness after her stent procedure.    Assessment and Plan:  Intracranial mass, seen on CT likely a meningioma -Patient presented with left-sided weakness and slurred speech. -Noncontrast CT done in the ED with 5.0 cm long axis left paracentral intracranial mass along the planum sphenoidale and anterior falx, highly likely to be a meningioma associated with 2.0 cm of localized left right midline shift and with extensive vasogenic edema in the left frontal lobe.  Frontal horn of the left lateral ventricle partially effaced.  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 -MRI brain done as recommended per neurosurgery with large acute right paramedian pontine infarct, anterior cranial fossa meningioma with large amount of surrounding vasogenic edema in the anterior left frontal lobe.  Rightward midline shift with subfalcine herniation of the left cingulate gyrus. -Neurosurgery consulted who recommended IV Decadron 4 mg every 6 hours and now recommending to continue IV Decadron 4 mg every 12 hours x2 to 3 days and then 2 mg every 12 hours x2 to 3 days then subsequently discontinue per discussion with neurosurgery, Dr. Annette Stable. -Neurosurgery also recommending conservative treatment at this time allowing patient to recover from stroke before considering major surgical intervention to debulk and remove left frontal meningioma. -Continue GI prophylaxis with PPI.   -Now on IV Fluids; See below -Per last neurosurgical note there we will hold off on any consideration of surgical intervention until patient recovers from stenting the procedure and in fact that they may be able to simply observe the meningioma should she have symptoms particularly attributable to it  Large acute right paramedian pontine infarct in the setting of severe stenosis of the basilar arteries -Noted on MRI brain. -Stroke work-up underway and  stroke pathway ordered. -CT angiogram head and neck ordered with no LVO, multivessel multifocal stenosis.  Focal marked stenosis of the basilar  proximal to superior cerebellar artery origins. 2D echo ordered. -Patient was on aspirin prior to admission. -Permissive hypertension -Neurology consulted and feels stroke appears to be due to large vessel disease, also noted that patient seemed to have bradyphrenia and executive dysfunction which could be due to edema on the left frontal lobe. -2D echo with EF of 60 to 65%, NWMA, moderate asymmetric LVH of the basal septal, no embolic source noted. -TSH within normal limits at 0.993, folate decreased at 5.8, vitamin B12 at 287. -LDL of 133.  Patient is on a statin of Rosuvastatin 20 mg p.o. daily -EEG done and showed "This EEG was obtained while awake and asleep and is abnormal due to mild diffuse slowing indicative of global cerebral dysfunction and superimposed focal slowing over the left frontal region in the area of patient's known meningioma. Epileptiform abnormalities were not seen during this recording." -Continue statin with goal LDL < 70. -Patient currently on aspirin and Plavix for dual antiplatelet treatment per neurology recommendations. -Patient being followed by PT/OT/SLP and they are recommending CIR once ready for discharge -Per neurology and IR   Basilar Artery Stenosis -CT angiogram head and neck with marked stenosis of basilar artery proximal to SCA's. -Being followed by neurology who have consulted IR for further evaluation. -Patient seen in consultation by IR, Dr. Estanislado Pandy on 10/28/2021 and cerebral angiogram with angioplasty/possible stent placement within the basilar artery plan tentatively for Tuesday, 10/30/2021 under general anesthesia however has been rescheduled to Thursday, 11/01/2021.   -Per IR no need to hold aspirin and Plavix. -Per IR and neurology. Plan is for procedure this afternoon but IR hesitant given slight worsened Renal Fxn so she is on IVF now and will be reassessed for the procedure later but if not done today she will be D/C'd to CIR and then done after  she is being discharged from CIR  AKI on CKD stage IIIb -Stable. -Patient's BUNs/creatinine has gone from 45/1.22 is now slightly trended up to 48/1.43 yesterday and to 55/1.69 -Given IR hesitancy to do the procedure they have initiated IVF with NS at 100 mL/hr and will continue for now but patient has been ready since Saturday for the Procedure and will be reassessed this Afternoon for the procedure; \ -Avoid further nephrotoxic medications, contrast dyes, hypotension and dehydration and will need to renally adjust medications -Continue monitor and trend and repeat CMP in a.m.  Acute thrombocytopenia -Patient with no overt bleeding. -Thrombocytopenia had resolved but is now back again and platelet count has gone from 156 -> 137 -> 148 -Continue to monitor and trend and repeat CMP in  History of Endometrial Cancer -Patient follows with OB/GYN oncology, Dr. Berline Lopes in the outpatient setting. -Outpatient follow-up.  Hyponatremia -Mild. Na+ went from 135 -> 134 -Started IVF as above -Continue to Monitor and Trend and repeat CMP in the AM   Diabetes mellitus type 2 -Hemoglobin A1c 7.0 (10/26/2021) -Continue to hold oral hypoglycemic agents. -Elevated CBGs secondary to steroids. -CBGs ranging from 199-351 -Continue Semglee to 38 units daily. -Monitor CBGs with steroid taper. -Continue with moderate NovoLog/scale insulin before meals and at bedtime and may need to adjust further insulin regimen  Hyperlipidemia -LDL of 33, goal LDL < 70 -Continue fenofibrate 160 mg p.o. daily and rosuvastatin 20 mg p.o. daily  Hypertension -Continue Irbesartan 150 mg daily but may need to hold given slightly  worsened Renal Fxn but will re-evaluate in the AM   -Permissive hypertension secondary to acute CVA. -Avoid low blood pressure.    E. coli UTI -Urinalysis concerning for UTI. -Urine cultures > 100,000 colonies of E. coli pansensitive.   -Continued IV Rocephin to treat for total of 5 days as  patient was to undergo cerebral angiogram with possible angioplasty versus stent placement.   -Patient has completed antibiotics at this time now   DVT prophylaxis: Place and maintain sequential compression device Start: 10/26/21 1623 SCDs Start: 10/25/21 2158    Code Status: Full Code Family Communication: No family present at bedside   Disposition Plan:  Level of care: Telemetry Cardiac Status is: Inpatient Remains inpatient appropriate because: Undergoing basilar artery stenting versus angioplasty possibly today   Consultants:  Neurosurgery Interventional radiology Neurology CIR  Procedures:  CT head 10/25/2021 CT angiogram head and neck 10/26/2021  MRI brain 10/25/2021 2D echo 10/26/2021 EEG 10/26/2021  Antimicrobials:  Anti-infectives (From admission, onward)    Start     Dose/Rate Route Frequency Ordered Stop   10/30/21 0900  cefTRIAXone (ROCEPHIN) 1 g in sodium chloride 0.9 % 100 mL IVPB        1 g 200 mL/hr over 30 Minutes Intravenous Every 24 hours 10/29/21 1230 10/31/21 0824   10/27/21 1000  cefTRIAXone (ROCEPHIN) 2 g in sodium chloride 0.9 % 100 mL IVPB        2 g 200 mL/hr over 30 Minutes Intravenous Every 24 hours 10/27/21 0910 10/29/21 0952       Subjective: Seen and examined at bedside and she was hungry. No CP or SOB. Plan was for Stenting/Angioplasty but given slightly worsened Renal Fxn will be re-assessed this afternoon. No other concerns or complaints at this time.   Objective: Vitals:   10/31/21 1949 10/31/21 2318 11/01/21 0300 11/01/21 0731  BP: (!) 155/58 132/71 (!) 145/80 (!) 176/115  Pulse: 99 88 71 67  Resp: 20 (!) 23 (!) 24 11  Temp: 98.7 F (37.1 C) 98.4 F (36.9 C) 97.7 F (36.5 C) 98.2 F (36.8 C)  TempSrc: Oral Oral Oral Oral  SpO2: 95% 95% 94% 94%  Weight:      Height:        Intake/Output Summary (Last 24 hours) at 11/01/2021 0957 Last data filed at 11/01/2021 4742 Gross per 24 hour  Intake 540 ml  Output 1150 ml  Net -610 ml    Filed Weights   10/25/21 1531  Weight: 76.7 kg   Examination: Physical Exam:  Constitutional: WN/WD overweight Caucasian female in NAD Respiratory: Diminished to auscultation bilaterally, no wheezing, rales, rhonchi or crackles. Normal respiratory effort and patient is not tachypenic. No accessory muscle use. Unlabored breathing Cardiovascular: RRR, no murmurs / rubs / gallops. S1 and S2 auscultated. No extremity edema. Abdomen: Soft, non-tender, Distended 2/2 body habitus. No masses palpated. No appreciable hepatosplenomegaly. Bowel sounds positive.  GU: Deferred. Musculoskeletal: No clubbing / cyanosis of digits/nails. No joint deformity upper and lower extremities.  Skin: No rashes, lesions, ulcers on a limited skin evaluation. No induration; Warm and dry.  Neurologic: CN 2-12 grossly intact with no focal deficits but still has a slight facial droop Psychiatric: Normal judgment and insight. Alert and oriented x2  Data Reviewed: I have personally reviewed following labs and imaging studies  CBC: Recent Labs  Lab 10/26/21 0225 10/27/21 0234 10/28/21 0230 10/29/21 0342 10/30/21 0449 10/31/21 0108 11/01/21 0309  WBC 5.8 7.2 9.0 8.2 9.3 10.5 9.9  NEUTROABS 5.1  6.1 7.3  --   --  8.5* 8.2*  HGB 14.5 14.4 15.4* 14.6 14.2 13.9 13.6  HCT 42.6 41.4 45.0 42.8 42.0 40.5 39.7  MCV 89.9 88.1 88.6 89.9 89.9 90.0 89.2  PLT 139* 151 159 164 156 137* 081*   Basic Metabolic Panel: Recent Labs  Lab 10/26/21 0225 10/27/21 0234 10/28/21 0230 10/29/21 0342 10/30/21 0449 10/31/21 0108 11/01/21 0309  NA 138   < > 138 138 138 135 134*  K 3.9   < > 4.2 4.7 4.5 4.7 4.9  CL 104   < > 103 100 102 99 99  CO2 22   < > '24 24 26 26 25  '$ GLUCOSE 251*   < > 184* 228* 221* 277* 265*  BUN 17   < > 39* 44* 45* 48* 55*  CREATININE 0.96   < > 1.32* 1.43* 1.22* 1.43* 1.69*  CALCIUM 9.2   < > 10.1 9.6 9.1 9.0 9.1  MG 1.8  --   --   --   --  2.3 2.2  PHOS 2.8  --   --   --   --   --  3.6   < > =  values in this interval not displayed.   GFR: Estimated Creatinine Clearance: 26.3 mL/min (A) (by C-G formula based on SCr of 1.69 mg/dL (H)). Liver Function Tests: Recent Labs  Lab 10/25/21 1540 10/26/21 0225 11/01/21 0309  AST 12* 15 12*  ALT '11 14 10  '$ ALKPHOS 51 51 59  BILITOT 0.5 0.8 0.4  PROT 6.9 6.7 5.8*  ALBUMIN 4.1 3.6 2.8*   No results for input(s): "LIPASE", "AMYLASE" in the last 168 hours. No results for input(s): "AMMONIA" in the last 168 hours. Coagulation Profile: Recent Labs  Lab 10/25/21 1540 10/30/21 0449  INR 1.1 1.2   Cardiac Enzymes: No results for input(s): "CKTOTAL", "CKMB", "CKMBINDEX", "TROPONINI" in the last 168 hours. BNP (last 3 results) No results for input(s): "PROBNP" in the last 8760 hours. HbA1C: No results for input(s): "HGBA1C" in the last 72 hours. CBG: Recent Labs  Lab 10/31/21 0735 10/31/21 1139 10/31/21 1608 10/31/21 2115 11/01/21 0809  GLUCAP 193* 187* 351* 342* 199*   Lipid Profile: No results for input(s): "CHOL", "HDL", "LDLCALC", "TRIG", "CHOLHDL", "LDLDIRECT" in the last 72 hours. Thyroid Function Tests: No results for input(s): "TSH", "T4TOTAL", "FREET4", "T3FREE", "THYROIDAB" in the last 72 hours. Anemia Panel: No results for input(s): "VITAMINB12", "FOLATE", "FERRITIN", "TIBC", "IRON", "RETICCTPCT" in the last 72 hours. Sepsis Labs: No results for input(s): "PROCALCITON", "LATICACIDVEN" in the last 168 hours.  Recent Results (from the past 240 hour(s))  Urine Culture     Status: Abnormal   Collection Time: 10/27/21  9:10 AM   Specimen: Urine, Catheterized  Result Value Ref Range Status   Specimen Description URINE, CATHETERIZED  Final   Special Requests   Final    NONE Performed at [facility], [address] [place], [place] [address]    Culture >=100,000 COLONIES/mL ESCHERICHIA COLI (A)  Final   Report Status 10/29/2021 FINAL  Final   Organism ID, Bacteria ESCHERICHIA COLI (A)  Final       Susceptibility   Escherichia coli - MIC*    AMPICILLIN 8 SENSITIVE Sensitive     CEFAZOLIN <=4 SENSITIVE Sensitive     CEFEPIME <=0.12 SENSITIVE Sensitive     CEFTRIAXONE <=0.25 SENSITIVE Sensitive     CIPROFLOXACIN <=0.25 SENSITIVE Sensitive     GENTAMICIN <=1 SENSITIVE Sensitive  IMIPENEM <=0.25 SENSITIVE Sensitive     NITROFURANTOIN 32 SENSITIVE Sensitive     TRIMETH/SULFA <=20 SENSITIVE Sensitive     AMPICILLIN/SULBACTAM 8 SENSITIVE Sensitive     PIP/TAZO <=4 SENSITIVE Sensitive     * >=100,000 COLONIES/mL ESCHERICHIA COLI     Radiology Studies: No results found.   Scheduled Meds:  aspirin EC  81 mg Oral Daily   calcium carbonate  1,250 mg Oral Q breakfast   clopidogrel  75 mg Oral Daily   dexamethasone (DECADRON) injection  4 mg Intravenous Q12H   fenofibrate  160 mg Oral QPC breakfast   folic acid  1 mg Oral Daily   insulin aspart  0-15 Units Subcutaneous TID WC   insulin aspart  0-5 Units Subcutaneous QHS   insulin glargine-yfgn  38 Units Subcutaneous Daily   irbesartan  150 mg Oral Daily   pantoprazole  40 mg Oral Daily   rosuvastatin  20 mg Oral Daily   Continuous Infusions:  sodium chloride     sodium chloride      LOS: 6 days   Raiford Noble, DO [facility] Available via Epic secure chat 7am-7pm After these hours, please refer to coverage provider listed on [email or web address] 11/01/2021, 9:57 AM

## 2021-11-01 NOTE — Anesthesia Preprocedure Evaluation (Addendum)
Anesthesia Evaluation  Patient identified by MRN, date of birth, ID band Patient awake    Reviewed: Allergy & Precautions, H&P , NPO status , Patient's Chart, lab work & pertinent test results  History of Anesthesia Complications (+) PONV and history of anesthetic complications  Airway Mallampati: II  TM Distance: >3 FB Neck ROM: Full    Dental no notable dental hx. (+) Poor Dentition, Dental Advisory Given   Pulmonary neg pulmonary ROS, former smoker,    Pulmonary exam normal breath sounds clear to auscultation       Cardiovascular hypertension, Pt. on medications  Rhythm:Regular Rate:Normal  10/26/2021 ECHO: EF 60-65%, normal LVF, no regional wall motion abnormalities, mod ASH of basal septum, normal RVF, no significant valvular abnormalities   Neuro/Psych  Headaches, Depression CVA, Residual Symptoms negative psych ROS   GI/Hepatic Neg liver ROS, GERD  ,  Endo/Other  diabetes (glu 211), Insulin Dependent, Oral Hypoglycemic AgentsHypothyroidism   Renal/GU Renal InsufficiencyRenal disease  negative genitourinary   Musculoskeletal  (+) Arthritis , Osteoarthritis,    Abdominal   Peds  Hematology negative hematology ROS (+)   Anesthesia Other Findings   Reproductive/Obstetrics negative OB ROS                           Anesthesia Physical Anesthesia Plan  ASA: 3  Anesthesia Plan: General   Post-op Pain Management: Tylenol PO (pre-op)*   Induction: Intravenous  PONV Risk Score and Plan: 4 or greater and Ondansetron, Dexamethasone and Treatment may vary due to age or medical condition  Airway Management Planned: Oral ETT  Additional Equipment: Arterial line  Intra-op Plan:   Post-operative Plan: Possible Post-op intubation/ventilation  Informed Consent: I have reviewed the patients History and Physical, chart, labs and discussed the procedure including the risks, benefits and  alternatives for the proposed anesthesia with the patient or authorized representative who has indicated his/her understanding and acceptance.     Dental advisory given  Plan Discussed with: CRNA  Anesthesia Plan Comments:        Anesthesia Quick Evaluation

## 2021-11-01 NOTE — Sedation Documentation (Signed)
TR band in place over puncture site. No increase in hematoma at this time. Site is ecchymotic with notable fluid beneath skin around TR band site. Site is marked where hematoma originated. Pulses intact via palpation +1. Arm elevated at this time.

## 2021-11-01 NOTE — Anesthesia Procedure Notes (Signed)
Arterial Line Insertion Start/End7/13/2023 12:05 PM, 11/01/2021 12:10 PM Performed by: Wilburn Cornelia, CRNA, CRNA  Patient location: Pre-op. Preanesthetic checklist: patient identified, IV checked, site marked, risks and benefits discussed, surgical consent, monitors and equipment checked, pre-op evaluation, timeout performed and anesthesia consent Lidocaine 1% used for infiltration Left, radial was placed Catheter size: 20 G Maximum sterile barriers used  Allen's test indicative of satisfactory collateral circulation Attempts: 1 Procedure performed without using ultrasound guided technique. Following insertion, Biopatch and dressing applied. Post procedure assessment: normal

## 2021-11-01 NOTE — Anesthesia Procedure Notes (Signed)
Procedure Name: Intubation Date/Time: 11/01/2021 2:07 PM  Performed by: Barrington Ellison, CRNAPre-anesthesia Checklist: Patient identified, Emergency Drugs available, Suction available and Patient being monitored Patient Re-evaluated:Patient Re-evaluated prior to induction Oxygen Delivery Method: Circle System Utilized Preoxygenation: Pre-oxygenation with 100% oxygen Induction Type: IV induction Ventilation: Mask ventilation without difficulty Laryngoscope Size: Mac and 3 Grade View: Grade I Tube type: Oral Tube size: 7.0 mm Number of attempts: 1 Airway Equipment and Method: Stylet and Oral airway Placement Confirmation: ETT inserted through vocal cords under direct vision, positive ETCO2 and breath sounds checked- equal and bilateral Secured at: 21 cm Tube secured with: Tape Dental Injury: Teeth and Oropharynx as per pre-operative assessment

## 2021-11-01 NOTE — Progress Notes (Signed)
Patient assessed in IR s/p intervention this afternoon with Dr. Estanislado Pandy.  She remains intubated.  R radial access site with soft hematoma. Puncture site stable. TR band in place. Distal pulses intact. Hand warm.   Mild pedal edema. Patient was given saline bolus this afternoon prior to procedure for renal protection.  Positive UOP from foley.   Anesthesia remains with patient.  To be transferred to 4N post recovery.    Brynda Greathouse, MS RD PA-C 5:10 PM

## 2021-11-01 NOTE — Progress Notes (Signed)
[facility] Progress Note Patient Name: KINETA FUDALA DOB: Sep 30, 1940 MRN: [id number]   Date of Service  11/01/2021  HPI/Events of Note  Review of CXR reveals ETT tip in satisfactory position 3.4 cm above the carina.   eICU Interventions  Continue present management.      Intervention Category Major Interventions: Respiratory failure - evaluation and management  Lysle Dingwall 11/01/2021, 9:43 PM

## 2021-11-01 NOTE — Consult Note (Signed)
NAME:  Cassandra Dodson, MRN:  [id number], DOB:  12-04-1940, LOS: 6 ADMISSION DATE:  10/25/2021, CONSULTATION DATE:  7/13 REFERRING MD:  Dr Estanislado Pandy, CHIEF COMPLAINT:  basilar artery stenosis   History of Present Illness:  81 year old female with PMH as below, which is significant for CKD III, DM, endometrial ca (two years post completion of adjuvant therapy), HTN, and hypothyroid. She was in her usual state of health until approximately July 4th of this year, when she developed slurred speech and then left sided weakness the following day which caused her to present to [facility] ED on 7/6. Head CT demonstrated mass likely to be meningioma with vasogenic edema in the L frontal lobe. Neurosurgery was consulted and started steroids. MRI brain showed large acute right pontine infarct secondary to stenosis of the basilar artery. There was felt to be high risk of recurrent stroke due to the stenosis, so IR was asked to evaluate. On 7/13 she was taken for basilar artery stent. The procedure was uncomplicated, however, she was noted to have some peripheral edema and for this reason the decision was made not to extubate. PCCM consulted for ICU and ventilatory management.   Pertinent  Medical History   has a past medical history of Arthritis, CKD (chronic kidney disease), stage III ([place]), Depression, Diabetes mellitus without complication ([place]), Difficult intravenous access, Endometrial cancer ([place]), GERD (gastroesophageal reflux disease), Headache, History of radiation therapy (11/04/2019-12/01/2019), Hypertension, Hypothyroidism, Neuromuscular disorder ([place]), Osteoarthritis, PMB (postmenopausal bleeding), PONV (postoperative nausea and vomiting), Urinary frequency, and Wears glasses.   Significant Hospital Events: Including procedures, antibiotic start and stop dates in addition to other pertinent events   7/6 admit for meningioma, MRI discovered pontine stroke/basilar stenosis 7/9 IR consulted. Plans for  basilar stent 7/13 to IR for stent. Post op to ICU on vent.   Interim History / Subjective:    Objective   Blood pressure (!) 154/93, pulse 79, temperature 98.5 F (36.9 C), temperature source Oral, resp. rate 18, height '5\' 5"'$  (1.651 m), weight 76.7 kg, SpO2 95 %.        Intake/Output Summary (Last 24 hours) at 11/01/2021 1711 Last data filed at 11/01/2021 1635 Gross per 24 hour  Intake 1840 ml  Output 2200 ml  Net -360 ml   Filed Weights   10/25/21 1531 11/01/21 1042  Weight: 76.7 kg 76.7 kg    Examination: General: elderly appearing female in NAD HENT: [place]/AT, PERRL, no JVD Lungs: Clear bilateral breath sounds Cardiovascular: RRR, no MRG Abdomen: Soft, non-tender, hypoactive.  Extremities: Trace lower extremity edema pitting 1+. Hematoma proximal to radial puncture site.  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 Lower extremity edema raised concern for volume overload requiring the patient to remain intubated.  - Full vent support - Wean FiO2 for sats > 92% - Propofol and fentanyl for RASS goal -1 to -2.  - Will plan for SBT and hopeful extubation tomorrow morning.   Left frontal meningioma: MRI left anterior cranial fossa meningioma with vasogenic edema and midline shift - followed by neurosurgery - No role for surgery at this time, plan for possible resection in 8-12  weeks.  - Continue decadron.   CKD - trend UPO, electrolytes.   HTN HLD - hold home irbesartan while on tight BP goals above - Fenofibrate, crestor  DM - CBG monitoring and SSI - Semglee 38 units conitnue. If she is not able to  extubate and eat tomorrow we may need to reduce this.  - hold metformin  Dysphagia - SLP following - nectar thick - Aspiration precautions  Best Practice (right click and "Reselect all SmartList Selections" daily)   Diet/type: NPO DVT prophylaxis: systemic heparin GI prophylaxis: PPI Lines: Central line and Arterial Line Foley:  Yes, and it is still needed Code Status:  full code Last date of multidisciplinary goals of care discussion '[ ]'$   Labs   CBC: Recent Labs  Lab 10/26/21 0225 10/27/21 0234 10/28/21 0230 10/29/21 0342 10/30/21 0449 10/31/21 0108 11/01/21 0309  WBC 5.8 7.2 9.0 8.2 9.3 10.5 9.9  NEUTROABS 5.1 6.1 7.3  --   --  8.5* 8.2*  HGB 14.5 14.4 15.4* 14.6 14.2 13.9 13.6  HCT 42.6 41.4 45.0 42.8 42.0 40.5 39.7  MCV 89.9 88.1 88.6 89.9 89.9 90.0 89.2  PLT 139* 151 159 164 156 137* 148*    Basic Metabolic Panel: Recent Labs  Lab 10/26/21 0225 10/27/21 0234 10/28/21 0230 10/29/21 0342 10/30/21 0449 10/31/21 0108 11/01/21 0309  NA 138   < > 138 138 138 135 134*  K 3.9   < > 4.2 4.7 4.5 4.7 4.9  CL 104   < > 103 100 102 99 99  CO2 22   < > '24 24 26 26 25  '$ GLUCOSE 251*   < > 184* 228* 221* 277* 265*  BUN 17   < > 39* 44* 45* 48* 55*  CREATININE 0.96   < > 1.32* 1.43* 1.22* 1.43* 1.69*  CALCIUM 9.2   < > 10.1 9.6 9.1 9.0 9.1  MG 1.8  --   --   --   --  2.3 2.2  PHOS 2.8  --   --   --   --   --  3.6   < > = values in this interval not displayed.   GFR: Estimated Creatinine Clearance: 26.3 mL/min (A) (by C-G formula based on SCr of 1.69 mg/dL (H)). Recent Labs  Lab 10/29/21 0342 10/30/21 0449 10/31/21 0108 11/01/21 0309  WBC 8.2 9.3 10.5 9.9    Liver Function Tests: Recent Labs  Lab 10/26/21 0225 11/01/21 0309  AST 15 12*  ALT 14 10  ALKPHOS 51 59  BILITOT 0.8 0.4  PROT 6.7 5.8*  ALBUMIN 3.6 2.8*   No results for input(s): "LIPASE", "AMYLASE" in the last 168 hours. No results for input(s): "AMMONIA" in the last 168 hours.  ABG No  results found for: "PHART", "PCO2ART", "PO2ART", "HCO3", "TCO2", "ACIDBASEDEF", "O2SAT"   Coagulation Profile: Recent Labs  Lab 10/30/21 0449 11/01/21 1127  INR 1.2 1.2    Cardiac Enzymes: No results for input(s): "CKTOTAL", "CKMB", "CKMBINDEX", "TROPONINI" in the last 168 hours.  HbA1C: Hemoglobin A1C  Date/Time Value Ref Range Status  01/23/2015 12:00 AM 7.6  Final   Hgb A1c MFr Bld  Date/Time Value Ref Range Status  10/26/2021 02:25 AM 7.0 (H) 4.8 - 5.6 % Final    Comment:    (NOTE) Pre diabetes:          5.7%-6.4%  Diabetes:              >6.4%  Glycemic control for   <  7.0% adults with diabetes   09/03/2019 03:27 PM 7.9 (H) 4.8 - 5.6 % Final    Comment:    (NOTE) Pre diabetes:          5.7%-6.4% Diabetes:              >6.4% Glycemic control for   <7.0% adults with diabetes     CBG: Recent Labs  Lab 10/31/21 1608 10/31/21 2115 11/01/21 0809 11/01/21 0959 11/01/21 1236  GLUCAP 351* 342* 199* 211* 127*    Review of Systems:   Patient is encephalopathic and/or intubated. Therefore history has been obtained from chart review.    Past Medical History:  She,  has a past medical history of Arthritis, CKD (chronic kidney disease), stage III ([place]), Depression, Diabetes mellitus without complication ([place]), Difficult intravenous access, Endometrial cancer ([place]), GERD (gastroesophageal reflux disease), Headache, History of radiation therapy (11/04/2019-12/01/2019), Hypertension, Hypothyroidism, Neuromuscular disorder ([place]), Osteoarthritis, PMB (postmenopausal bleeding), PONV (postoperative nausea and vomiting), Urinary frequency, and Wears glasses.   Surgical History:   Past Surgical History:  Procedure Laterality Date   BREAST SURGERY     cyst removed   CESAREAN SECTION     DILATION AND CURETTAGE OF UTERUS N/A 06/24/2019   Procedure: DILATATION AND CURETTAGE;  Surgeon: Lafonda Mosses, MD;  Location[facility];  Service: Gynecology;   Laterality: N/A;   FRACTURE SURGERY     right knee   INTRAUTERINE DEVICE (IUD) INSERTION N/A 06/24/2019   Procedure: INTRAUTERINE DEVICE (IUD) INSERTION MIRENA;  Surgeon: Lafonda Mosses, MD;  Location: [facility];  Service: Gynecology;  Laterality: N/A;   IRRIGATION AND DEBRIDEMENT SEBACEOUS CYST     JOINT REPLACEMENT Right    right   LYMPH NODE DISSECTION N/A 09/14/2019   Procedure: LYMPH NODE DISSECTION;  Surgeon: Lafonda Mosses, MD;  Location: [facility];  Service: Gynecology;  Laterality: N/A;   ROBOTIC ASSISTED TOTAL HYSTERECTOMY WITH BILATERAL SALPINGO OOPHERECTOMY Bilateral 09/14/2019   Procedure: XI ROBOTIC ASSISTED TOTAL HYSTERECTOMY WITH BILATERAL SALPINGO OOPHORECTOMY;  Surgeon: Lafonda Mosses, MD;  Location: [facility];  Service: Gynecology;  Laterality: Bilateral;   SENTINEL NODE BIOPSY N/A 09/14/2019   Procedure: SENTINEL NODE BIOPSY;  Surgeon: Lafonda Mosses, MD;  Location: [facility];  Service: Gynecology;  Laterality: N/A;   teeth extration     TONSILLECTOMY     TOTAL HIP ARTHROPLASTY Right 02/23/2015   Procedure: RIGHT TOTAL HIP ARTHROPLASTY ANTERIOR APPROACH;  Surgeon: Leandrew Koyanagi, MD;  Location: [place];  Service: Orthopedics;  Laterality: Right;   TOTAL KNEE ARTHROPLASTY Left 06/27/2016   Procedure: LEFT TOTAL KNEE ARTHROPLASTY;  Surgeon: Leandrew Koyanagi, MD;  Location: [place];  Service: Orthopedics;  Laterality: Left;     Social History:   reports that she quit smoking about 19 years ago. Her smoking use included cigarettes. She has a 67.50 pack-year smoking history. She has never used smokeless tobacco. She reports current alcohol use. She reports that she does not use drugs.   Family History:  Her family history includes Hypertension in her father; Pancreatic cancer in her mother; Stroke in her father. There is no history of Colon cancer, Breast cancer, Ovarian cancer, Endometrial cancer, or Prostate cancer.   Allergies Allergies  Allergen Reactions    Anesthetics, Amide Other (See Comments)    Pt is intolerant to general anesthesia. Pt will throw up and has thrown up during procedure.    Ether Nausea And Vomiting     Home Medications  Prior to Admission medications   Medication Sig Start Date End Date Taking? Authorizing Provider  ALPRAZolam Duanne Moron) 0.5 MG tablet Take 0.25-0.5 mg by mouth at bedtime as needed for sleep. 09/21/21  Yes [provider]  aspirin EC 81 MG tablet Take 81 mg by mouth daily. Swallow whole.   Yes [provider]  calcium carbonate (OSCAL) 1500 (600 Ca) MG TABS tablet Take 1,500 mg by mouth daily with breakfast.   Yes [provider]  fenofibrate 160 MG tablet Take 160 mg by mouth daily after breakfast.  05/12/16  Yes [provider]  HUMALOG MIX 75/25 KWIKPEN (75-25) 100 UNIT/ML Kwikpen Inject 30 Units into the skin in the morning and at bedtime. 06/08/16  Yes [provider]  metFORMIN (GLUCOPHAGE) 1000 MG tablet Take 1,000 mg by mouth 2 (two) times daily.  01/21/15  Yes [provider]  telmisartan (MICARDIS) 80 MG tablet Take 80 mg by mouth daily after breakfast.  06/01/19  Yes [provider]     Critical care time: 43 minutes     Georgann Housekeeper, AGACNP-BC [place] for personal pager PCCM on call pager [phone] until 7pm. Please call Elink 7p-7a. [phone]  11/01/2021 6:27 PM

## 2021-11-01 NOTE — Sedation Documentation (Signed)
TR band changed by Dr. Estanislado Pandy with Margreta Journey IR tech holding proximal pressure due to increased size in hematoma. Site is hard to touch with notable skin color changes.

## 2021-11-01 NOTE — Sedation Documentation (Signed)
Patient transported to [place] ICU with CRNA staff Almyra Free and RN Miquel Dunn. Patient bagged on way up. Nursing floor staff at the bedside to receive patient along with RRT [facility]. Bedside handoff to [facility]. Right wrist assessed. Ecchymotic in appearance. No palpable hematoma at this time. Notable fluid under skin and marked with skin marker. Palpable +1 radial pulse intact. Arm to be elevated overnight. At 1800, begin deflating TR band.

## 2021-11-01 NOTE — TOC CAGE-AID Note (Signed)
Transition of Care [facility]) - CAGE-AID Screening   Patient Details  Name: Cassandra Dodson MRN: [id number] Date of Birth: 02/17/1941  Transition of Care [facility]) CM/SW Contact:    Coralee Pesa, [place] Phone Number: 11/01/2021, 1:58 PM   Clinical Narrative: Pt off floor for procedure, TOC will attempt assessment again as time allows.   CAGE-AID Screening: Substance Abuse Screening unable to be completed due to: : Patient unable to participate             Substance Abuse Education Offered: No

## 2021-11-01 NOTE — Progress Notes (Signed)
STROKE TEAM PROGRESS NOTE   INTERVAL HISTORY No family is at the bedside. Pt lying in bed, NPO since last night. Not on IVF, this morning Cre up to 1.69. will give IVF and pending BA stent/angioplasty with Dr. Estanislado Pandy today.   Vitals:   11/01/21 1042 11/01/21 1100 11/01/21 1131 11/01/21 1207  BP: (!) 204/93 (!) 214/89 (!) 193/89 (!) 154/93  Pulse: 75   79  Resp: 18     Temp: 98.5 F (36.9 C)     TempSrc: Oral     SpO2: 95%     Weight: 76.7 kg     Height: '5\' 5"'$  (1.651 m)      CBC:  Recent Labs  Lab 10/31/21 0108 11/01/21 0309  WBC 10.5 9.9  NEUTROABS 8.5* 8.2*  HGB 13.9 13.6  HCT 40.5 39.7  MCV 90.0 89.2  PLT 137* 322*   Basic Metabolic Panel:  Recent Labs  Lab 10/26/21 0225 10/27/21 0234 10/31/21 0108 11/01/21 0309  NA 138   < > 135 134*  K 3.9   < > 4.7 4.9  CL 104   < > 99 99  CO2 22   < > 26 25  GLUCOSE 251*   < > 277* 265*  BUN 17   < > 48* 55*  CREATININE 0.96   < > 1.43* 1.69*  CALCIUM 9.2   < > 9.0 9.1  MG 1.8  --  2.3 2.2  PHOS 2.8  --   --  3.6   < > = values in this interval not displayed.   Lipid Panel:  Recent Labs  Lab 10/27/21 0234  CHOL 198  TRIG 59  HDL 53  CHOLHDL 3.7  VLDL 12  LDLCALC 133*   HgbA1c:  Recent Labs  Lab 10/26/21 0225  HGBA1C 7.0*   Urine Drug Screen: No results for input(s): "LABOPIA", "COCAINSCRNUR", "LABBENZ", "AMPHETMU", "THCU", "LABBARB" in the last 168 hours.  Alcohol Level  No results for input(s): "ETH" in the last 168 hours.   IMAGING past 24 hours No results found.  PHYSICAL EXAM General:  Alert, well-nourished, well-developed patient in no acute distress Respiratory:  Regular, unlabored respirations on room air  NEURO:  awake, alert, eyes open, orientated to age, place, month and people, but not to year. No aphasia, fluent language, following all simple commands, mild dysarthria. Able to name and repeat. No gaze palsy, tracking bilaterally, visual field full.  Mild left facial droop. Tongue  midline. RUE 5/5, LUE 4+/5 with mild pronator drift. Bilaterally LEs 4/5 proximal and distal. Sensation symmetrical bilaterally, b/l FTN intact but slow, gait not tested.    ASSESSMENT/PLAN Ms. CALIN ELLERY is a 81 y.o. female with history of  HTN, DM, CKD3, GERD and endometrial cancer  presenting with slurred speech for two days and left leg weakness for one day.  Patient's son states that she first seemed to be having problems on 7/4 when she had some difficulty with ambulation, then developed slurred speech and dragging of the left leg while walking.  [place] shows a left paracentral meningioma with vasogenic edema, and MRI brain shows a right paramedian pontine stroke. Patient denies any prior known history of meningioma or brain problems.  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  Therapy recommendations:  CIR Disposition:  pending  Basilar artery stenosis CTA head & neck marked stenosis of BA proximal to SCAs Per son, pt baseline walks with walker due to left hip and b/l knee replacement but lives independently PTA Now with mild left facial droop and left UE drift post stroke Given high risk of recurrent stroke with devastating outcome, basilar artery angioplasty or stent planned today IR Dr. Estanislado Pandy on board  Left frontal large meningioma CT head 5.0 cm  intracranial mass along anterior falx, likely to be a meningioma with vasogenic edema and 2 cm localized midline shift MRI left anterior cranial fossa meningioma with vasogenic edema and MLS Neurosurgery on board Plan for possible surgical resection in 8-12 weeks first On decardron  Hypertension Home meds:  telmisartan 80 mg daily Stable but fluctuate On low dose avapro Avoid low BP Long-term BP goal normotensive  Hyperlipidemia Home meds:  fenofibrate 160 mg daily, resumed in hospital LDL 133, goal < 70 Add rosuvastatin 20 mg daily  Continue statin at discharge  Diabetes type II Uncontrolled Home meds:  metformin 1000 mg BID HgbA1c 7.0, goal < 7.0 CBGs SSI On insulin now Hyperglycemia especially on decardron now, improved Close PCP follow up for better DM control in the setting of steroid use  Dysphagia Speech on board Now on nectar thick liquid Aspiration precaution  AKI on CKD 3a Cre 1.3->1.43->1.22->1.43->1.69 Put on IV fluid BMP moderate Avoid dehydration  Other Stroke Risk Factors Advanced Age >/= 66  Former cigarette smoker  Other Active Problems Hx of left hip and b/l Knee replacement   Hospital day # 6  I discussed with Dr. Estanislado Pandy and Dr. Alfredia Ferguson.  Rosalin Hawking, MD PhD Stroke Neurology 11/01/2021 5:22 PM    To contact Stroke Continuity provider, please refer to [email or web address] After hours, contact General Neurology

## 2021-11-01 NOTE — Procedures (Signed)
INR.  Status post right vertebral arteriogram.  Right radial approach.   Findings. 1.  Severe distal basilar artery stenosis just proximal to the origin of the superior cerebral arteries, with a 60 to 70% stenosis of the right posterior cerebral artery P1 segment. Procedure.  Status post endovascular revascularization of severely stenotic distal basilar artery with stent assisted angioplasty with a residual stenosis of less than 10%.  Post procedure right vertebral artery intracranially and extracranially remains widely patent as do the superior cerebellar arteries, the right posterior cerebral artery and the anterior inferior cerebellar arteries.  Retrograde flow noted into the left vertebral basilar junction.  Post procedure CT of the brain no evidence of hemorrhagic complications. Wristband applied at the right radial puncture site with distal right radial pulse noted to be present.  Postprocedure patient noted to develop bilateral forearm and lower extremity pitting edema. Patient assessed by anesthesia.  It was felt as central line will be needed to monitor patient's fluid intake given the paucity of peripheral IV access.  Patient to be left intubated for airway protection.  Arlean Hopping MD

## 2021-11-01 NOTE — Progress Notes (Signed)
ANTICOAGULATION CONSULT NOTE - Initial Consult  Pharmacy Consult:  Heparin Indication:  Post interventional neuroradiology procedure  Allergies  Allergen Reactions   Anesthetics, Amide Other (See Comments)    Pt is intolerant to general anesthesia. Pt will throw up and has thrown up during procedure.    Ether Nausea And Vomiting    Patient Measurements: Height: '5\' 5"'$  (165.1 cm) Weight: 76.7 kg (169 lb 1.5 oz) IBW/kg (Calculated) : 57 Heparin Dosing Weight: 76 kg  Vital Signs: Temp: 98.5 F (36.9 C) (07/13 1042) Temp Source: Oral (07/13 1042) BP: 154/93 (07/13 1207) Pulse Rate: 79 (07/13 1207)  Labs: Recent Labs    10/30/21 0449 10/31/21 0108 11/01/21 0309 11/01/21 1127  HGB 14.2 13.9 13.6  --   HCT 42.0 40.5 39.7  --   PLT 156 137* 148*  --   LABPROT 15.0  --   --  15.4*  INR 1.2  --   --  1.2  CREATININE 1.22* 1.43* 1.69*  --     Estimated Creatinine Clearance: 26.3 mL/min (A) (by C-G formula based on SCr of 1.69 mg/dL (H)).   Medical History: Past Medical History:  Diagnosis Date   Arthritis    CKD (chronic kidney disease), stage III ([place])    patient denies   Depression    Diabetes mellitus without complication ([place])    Difficult intravenous access    Endometrial cancer ([place])    GERD (gastroesophageal reflux disease)    Headache    History of radiation therapy 11/04/2019-12/01/2019   Endometrial HDR; Dr. Gery Pray   Hypertension    Hypothyroidism    Neuromuscular disorder ([place])    neuropathy in feet   Osteoarthritis    PMB (postmenopausal bleeding)    PONV (postoperative nausea and vomiting)    severe nausea and vomiting after knee replacement 06-2014, did ok with 2018 knee replacement   Urinary frequency    Wears glasses     Assessment: 35 YOF with right paramedian pontine infarct and severe stenosis of the basilar artery.  Now s/p basilar artery stenting.  Noted hematoma expansion at TR band site earlier today and is still present per  discussion with RN.  Spoke to Dr. Estanislado Pandy, start low-dose IV heparin at 2000 tonight.  MD aware TR band has not been removed.  CBC stable.  Goal of Therapy:  Heparin level 0.1-0.25 units/ml Monitor platelets by anticoagulation protocol: Yes   Plan:  At 2000, start IV heparin at 500 units/hr Check 8 hr heparin level Stop heparin in AM per protocol Monitor for hematoma expansion  Aleecia Tapia D. Mina Marble, PharmD, BCPS, [place] 11/01/2021, 6:45 PM

## 2021-11-01 NOTE — Progress Notes (Signed)
Patient ID: Cassandra Dodson, female   DOB: [birth year], 81 y.o.   MRN: [id number] INR. Patient seen today in the presence of her son.  Neurologically remained stable with no new symptoms pertaining to the posterior circulation.   The patient is able to swallow liquids and solids fairly comfortably. Patient remains ambulatory  with occasional support.  The procedure, the reasons, and alternatives were again reviewed with the patient and the patient's son. Management consideration of continued aggressive medical management versus consideration of attempt at endovascular revascularization of suspected high-grade symptomatic basilar artery stenoses were discussed in detail. The son and the patient would like to proceed with a diagnostic cath arteriogram with intent to treat pending favorable risk-benefit ratio.  Risk of 3 to 5% of new stroke with potential for significant neurological injury with paralysis, vent dependency, hemorrhage and death were reviewed.  The patient and her son are in agreement to proceed with the diagnostic cath arteriogram with intent to treat.  Arlean Hopping MD

## 2021-11-01 NOTE — Sedation Documentation (Signed)
Hematoma extending proximally. Dr. Estanislado Pandy at bedside. Manual pressure applied by Gilmore Laroche, RTR. TR Band repositioned by Dr. Estanislado Pandy. Hemostasis achieved. Hematoma soft with borders marked. RIGHT radial pulses distal to TR Band dopplerable, good pleth on POx probe on RIGHT thumb.

## 2021-11-01 NOTE — Progress Notes (Signed)
Pt off unit for IR procedure. Pt NPO since midnight, CHG bath given this am. Report called to short-stay RN. Pt's son agreed to go with pt to short-stay and will sign consent if needed. New PIV placed in pt's rt forearm, with NS bolus running at this time.  Justice Rocher, RN

## 2021-11-01 NOTE — Transfer of Care (Signed)
Immediate Anesthesia Transfer of Care Note  Patient: Cassandra Dodson  Procedure(s) Performed: ANGIOPLASTY/STENT  Patient Location: ICU  Anesthesia Type:General  Level of Consciousness: Patient remains intubated per anesthesia plan  Airway & Oxygen Therapy: Patient remains intubated per anesthesia plan and Patient placed on Ventilator (see vital sign flow sheet for setting)  Post-op Assessment: Report given to RN  Post vital signs: Reviewed and stable  Last Vitals:  Vitals Value Taken Time  BP 108/61 11/01/21 1747  Temp    Pulse 67 11/01/21 1751  Resp 16 11/01/21 1751  SpO2 99 % 11/01/21 1751  Vitals shown include unvalidated device data.  Last Pain:  Vitals:   11/01/21 1042  TempSrc: Oral  PainSc:          Complications: No notable events documented.

## 2021-11-01 NOTE — Anesthesia Procedure Notes (Signed)
Central Venous Catheter Insertion Performed by: Nolon Nations, MD, anesthesiologist Start/End7/13/2023 5:00 PM, 11/01/2021 5:20 PM Patient location: Pre-op. Preanesthetic checklist: patient identified, IV checked, site marked, risks and benefits discussed, surgical consent, monitors and equipment checked, pre-op evaluation, timeout performed and anesthesia consent Position: supine Lidocaine 1% used for infiltration and patient sedated Hand hygiene performed  and maximum sterile barriers used  Catheter size: 8 Fr Total catheter length 16. Central line was placed.Double lumen Procedure performed using ultrasound guided technique. Ultrasound Notes:anatomy identified, needle tip was noted to be adjacent to the nerve/plexus identified, no ultrasound evidence of intravascular and/or intraneural injection and image(s) printed for medical record Attempts: 1 Following insertion, dressing applied, line sutured and Biopatch. Post procedure assessment: blood return through all ports, free fluid flow and no air  Patient tolerated the procedure well with no immediate complications.

## 2021-11-01 NOTE — Progress Notes (Signed)
[facility] Progress Note Patient Name: Cassandra Dodson DOB: 1940-12-24 MRN: [id number]   Date of Service  11/01/2021  HPI/Events of Note  Radiology called critical report '@6'$ :67 PM that ETT was 7 mm above carina. No documentation of repositioning of ETT.  eICU Interventions  Plan: Portable CXR STAT to assess ETT position.      Intervention Category Major Interventions: Respiratory failure - evaluation and management  [name] 11/01/2021, 8:04 PM

## 2021-11-01 NOTE — Progress Notes (Signed)
Physical Therapy Treatment Patient Details Name: Cassandra Dodson MRN: [id number] DOB: 1941-04-01 Today's Date: 11/01/2021   History of Present Illness Pt is an 81 y/o female admitted secondary to slurred speech and L sided weakness. 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 Plan for stent placement 7/13. PMH including but not limited to HTN, DM, CKD3, GERD and endometrial cancer.    PT Comments    The pt was agreeable to session with focus on progressing OOB mobility and dynamic stability this morning. She was able to make great progress on total distance walked, but continues to need max cues for posture and technique as well as seated rest after 20-25 ft and up to modA to maintain balance. The pt needs progressively increased assist with fatigue, but is able to resume after short seated rest. The pt is encouraged by recent progress, will continue to benefit from skilled PT after planned procedure, acute inpatient rehab remains appropriate at this time.     Recommendations for follow up therapy are one component of a multi-disciplinary discharge planning process, led by the attending physician.  Recommendations may be updated based on patient status, additional functional criteria and insurance authorization.  Follow Up Recommendations  Acute inpatient rehab (3hours/day)     Assistance Recommended at Discharge Frequent or constant Supervision/Assistance  Patient can return home with the following Assistance with cooking/housework;Direct supervision/assist for medications management;Direct supervision/assist for financial management;Assist for transportation;Help with stairs or ramp for entrance;A lot of help with walking and/or transfers;A little help with  bathing/dressing/bathroom   Equipment Recommendations  None recommended by PT    Recommendations for Other Services       Precautions / Restrictions Precautions Precautions: Fall Precaution Comments: urinary incontenance, wears briefs Restrictions Weight Bearing Restrictions: No     Mobility  Bed Mobility Overal bed mobility: Needs Assistance Bed Mobility: Supine to Sit     Supine to sit: Min assist, HOB elevated Sit to supine: Min assist   General bed mobility comments: minA with increased time and effort, posterior lean with fatigue from movement    Transfers Overall transfer level: Needs assistance Equipment used: Rolling walker (2 wheels) Transfers: Sit to/from Stand Sit to Stand: Min assist, Min guard           General transfer comment: minA initially with cues for hand position, then progressed to minG with reps from recliner. x10 in session    Ambulation/Gait Ambulation/Gait assistance: Min assist, Mod assist Gait Distance (Feet): 20 Feet (+ 25 + 25 + 20) Assistive device: Rolling walker (2 wheels) Gait Pattern/deviations: Decreased stride length, Narrow base of support, Trunk flexed, Step-to pattern, Decreased step length - right, Decreased step length - left, Shuffle, Scissoring, Decreased stance time - left, Knee flexed in stance - left Gait velocity: decreased     General Gait Details: pt with progressively narrow BOS and small steps. cues to widen gait and increase stride length, LOB to L needing assist. HR to 120 bpm    Modified Rankin (Stroke Patients Only) Modified Rankin (Stroke Patients Only) Pre-Morbid Rankin Score: Moderately severe disability Modified Rankin: Moderately severe disability     Balance Overall balance assessment: Needs assistance Sitting-balance support: Feet supported, No upper extremity supported Sitting balance-Leahy Scale: Fair Sitting balance - Comments: posterior lean without UE  support in sitting Postural  control: Left lateral lean, Posterior lean Standing balance support: Bilateral upper extremity supported, During functional activity Standing balance-Leahy Scale: Poor Standing balance comment: reliant on BUE support and up to modA with fatigue and gait                            Cognition Arousal/Alertness: Awake/alert Behavior During Therapy: WFL for tasks assessed/performed Overall Cognitive Status: History of cognitive impairments - at baseline Area of Impairment: Attention, Safety/judgement, Following commands, Memory, Awareness, Problem solving                   Current Attention Level: Sustained Memory: Decreased short-term memory Following Commands: Follows one step commands with increased time, Follows one step commands inconsistently Safety/Judgement: Decreased awareness of deficits Awareness: Intellectual Problem Solving: Slow processing, Decreased initiation, Difficulty sequencing General Comments: pt following discrete commands, does need repetition at times. limited by [facility]. pt needs encouragement at times but is eventually agreeable.        Exercises Other Exercises Other Exercises: repeated sit-stand from recliner with cues for hip extension, slow eccentric lower. x7 from recliner    General Comments General comments (skin integrity, edema, etc.): VSS on RA, HR to 122      Pertinent Vitals/Pain Pain Assessment Pain Assessment: No/denies pain     PT Goals (current goals can now be found in the care plan section) Acute Rehab PT Goals Patient Stated Goal: eager to mobilize, son wants her to go to rehab PT Goal Formulation: With patient/family Time For Goal Achievement: 11/09/21 Potential to Achieve Goals: Good Progress towards PT goals: Progressing toward goals    Frequency    Min 4X/week      PT Plan Current plan remains appropriate       AM-PAC PT "6 Clicks" Mobility   Outcome Measure  Help needed turning from your back to  your side while in a flat bed without using bedrails?: A Little Help needed moving from lying on your back to sitting on the side of a flat bed without using bedrails?: A Lot Help needed moving to and from a bed to a chair (including a wheelchair)?: A Lot Help needed standing up from a chair using your arms (e.g., wheelchair or bedside chair)?: A Little Help needed to walk in hospital room?: A Lot Help needed climbing 3-5 steps with a railing? : A Lot 6 Click Score: 14    End of Session Equipment Utilized During Treatment: Gait belt Activity Tolerance: Patient tolerated treatment well Patient left: in bed;with call bell/phone within reach;with bed alarm set;with family/visitor present Nurse Communication: Mobility status PT Visit Diagnosis: Other abnormalities of gait and mobility (R26.89);Other symptoms and signs involving the nervous system (R29.898)     Time: 3149-7026 PT Time Calculation (min) (ACUTE ONLY): 30 min  Charges:  $Gait Training: 8-22 mins $Therapeutic Exercise: 8-22 mins                     West Carbo, PT, DPT   Acute Rehabilitation Department   Sandra Cockayne 11/01/2021, 8:44 AM

## 2021-11-01 NOTE — Progress Notes (Signed)
SLP Cancellation Note  Patient Details Name: TARISA PAOLA MRN: [id number] DOB: 1940/12/15   Cancelled treatment:       Reason Eval/Treat Not Completed: Patient at procedure or test/unavailable. Pt NPO, likely for procedure. Will f/u    Providence Stivers, Katherene Ponto 11/01/2021, 8:28 AM

## 2021-11-02 DIAGNOSIS — I6322 Cerebral infarction due to unspecified occlusion or stenosis of basilar arteries: Secondary | ICD-10-CM | POA: Diagnosis not present

## 2021-11-02 DIAGNOSIS — I651 Occlusion and stenosis of basilar artery: Secondary | ICD-10-CM | POA: Diagnosis not present

## 2021-11-02 DIAGNOSIS — L8990 Pressure ulcer of unspecified site, unspecified stage: Secondary | ICD-10-CM | POA: Insufficient documentation

## 2021-11-02 DIAGNOSIS — G9389 Other specified disorders of brain: Secondary | ICD-10-CM | POA: Diagnosis not present

## 2021-11-02 DIAGNOSIS — E1122 Type 2 diabetes mellitus with diabetic chronic kidney disease: Secondary | ICD-10-CM | POA: Diagnosis not present

## 2021-11-02 LAB — MAGNESIUM: Magnesium: 2 mg/dL (ref 1.7–2.4)

## 2021-11-02 LAB — CBC WITH DIFFERENTIAL/PLATELET
Abs Immature Granulocytes: 0.08 10*3/uL — ABNORMAL HIGH (ref 0.00–0.07)
Basophils Absolute: 0 10*3/uL (ref 0.0–0.1)
Basophils Relative: 0 %
Eosinophils Absolute: 0 10*3/uL (ref 0.0–0.5)
Eosinophils Relative: 0 %
HCT: 40 % (ref 36.0–46.0)
Hemoglobin: 13.1 g/dL (ref 12.0–15.0)
Immature Granulocytes: 1 %
Lymphocytes Relative: 10 %
Lymphs Abs: 1.1 10*3/uL (ref 0.7–4.0)
MCH: 30.4 pg (ref 26.0–34.0)
MCHC: 32.8 g/dL (ref 30.0–36.0)
MCV: 92.8 fL (ref 80.0–100.0)
Monocytes Absolute: 0.9 10*3/uL (ref 0.1–1.0)
Monocytes Relative: 8 %
Neutro Abs: 8.9 10*3/uL — ABNORMAL HIGH (ref 1.7–7.7)
Neutrophils Relative %: 81 %
Platelets: 178 10*3/uL (ref 150–400)
RBC: 4.31 MIL/uL (ref 3.87–5.11)
RDW: 14.7 % (ref 11.5–15.5)
WBC: 10.9 10*3/uL — ABNORMAL HIGH (ref 4.0–10.5)
nRBC: 0 % (ref 0.0–0.2)

## 2021-11-02 LAB — HEPARIN LEVEL (UNFRACTIONATED): Heparin Unfractionated: 0.12 IU/mL — ABNORMAL LOW (ref 0.30–0.70)

## 2021-11-02 LAB — GLUCOSE, CAPILLARY
Glucose-Capillary: 192 mg/dL — ABNORMAL HIGH (ref 70–99)
Glucose-Capillary: 145 mg/dL — ABNORMAL HIGH (ref 70–99)
Glucose-Capillary: 146 mg/dL — ABNORMAL HIGH (ref 70–99)
Glucose-Capillary: 115 mg/dL — ABNORMAL HIGH (ref 70–99)
Glucose-Capillary: 53 mg/dL — ABNORMAL LOW (ref 70–99)
Glucose-Capillary: 71 mg/dL (ref 70–99)
Glucose-Capillary: 78 mg/dL (ref 70–99)

## 2021-11-02 LAB — MRSA NEXT GEN BY PCR, NASAL: MRSA by PCR Next Gen: NOT DETECTED

## 2021-11-02 LAB — COMPREHENSIVE METABOLIC PANEL
ALT: 10 U/L (ref 0–44)
AST: 13 U/L — ABNORMAL LOW (ref 15–41)
Albumin: 2.7 g/dL — ABNORMAL LOW (ref 3.5–5.0)
Alkaline Phosphatase: 49 U/L (ref 38–126)
Anion gap: 13 (ref 5–15)
BUN: 45 mg/dL — ABNORMAL HIGH (ref 8–23)
CO2: 22 mmol/L (ref 22–32)
Calcium: 8.2 mg/dL — ABNORMAL LOW (ref 8.9–10.3)
Chloride: 103 mmol/L (ref 98–111)
Creatinine, Ser: 1.09 mg/dL — ABNORMAL HIGH (ref 0.44–1.00)
GFR, Estimated: 51 mL/min — ABNORMAL LOW (ref >60)
Glucose, Bld: 175 mg/dL — ABNORMAL HIGH (ref 70–99)
Potassium: 4.3 mmol/L (ref 3.5–5.1)
Sodium: 138 mmol/L (ref 135–145)
Total Bilirubin: 0.3 mg/dL (ref 0.3–1.2)
Total Protein: 5.6 g/dL — ABNORMAL LOW (ref 6.5–8.1)

## 2021-11-02 LAB — PLATELET INHIBITION P2Y12

## 2021-11-02 LAB — TRIGLYCERIDES: Triglycerides: 147 mg/dL (ref <150)

## 2021-11-02 LAB — PHOSPHORUS: Phosphorus: 4.2 mg/dL (ref 2.5–4.6)

## 2021-11-02 MED ORDER — ORAL CARE MOUTH RINSE: 15.0000 mL | OROMUCOSAL | Status: DC | PRN

## 2021-11-02 MED ORDER — FENOFIBRATE 160 MG PO TABS
160.0000 mg | ORAL_TABLET | Freq: Every day | ORAL | Status: DC
  Administered 2021-11-02: 160 mg
  Filled 2021-11-02 (×2): qty 1

## 2021-11-02 MED ORDER — ENOXAPARIN SODIUM 40 MG/0.4ML IJ SOSY
40.0000 mg | PREFILLED_SYRINGE | INTRAMUSCULAR | Status: DC
Start: 2021-11-02 — End: 2021-11-05
  Administered 2021-11-02 – 2021-11-05 (×4): 40 mg via SUBCUTANEOUS
  Filled 2021-11-02 (×4): qty 0.4

## 2021-11-02 MED ORDER — AMLODIPINE BESYLATE 10 MG PO TABS
10.0000 mg | ORAL_TABLET | Freq: Every day | ORAL | Status: DC
Start: 2021-11-02 — End: 2021-11-03
  Administered 2021-11-02: 10 mg
  Filled 2021-11-02: qty 1

## 2021-11-02 MED ORDER — ROSUVASTATIN CALCIUM 20 MG PO TABS
20.0000 mg | ORAL_TABLET | Freq: Every day | ORAL | Status: DC
  Administered 2021-11-02: 20 mg
  Filled 2021-11-02: qty 1

## 2021-11-02 MED ORDER — CALCIUM CARBONATE 1250 (500 CA) MG PO TABS
1250.0000 mg | ORAL_TABLET | Freq: Every day | ORAL | Status: DC
  Administered 2021-11-02: 1250 mg
  Filled 2021-11-02 (×2): qty 1

## 2021-11-02 MED ORDER — FOLIC ACID 1 MG PO TABS
1.0000 mg | ORAL_TABLET | Freq: Every day | ORAL | Status: DC
  Administered 2021-11-02: 1 mg
  Filled 2021-11-02: qty 1

## 2021-11-02 NOTE — Consult Note (Signed)
NAME:  Cassandra Dodson, MRN:  [id number], DOB:  [birth year], LOS: 7 ADMISSION DATE:  10/25/2021, CONSULTATION DATE:  7/13 REFERRING MD:  Dr Estanislado Pandy, CHIEF COMPLAINT:  basilar artery stenosis   History of Present Illness:  81 year old female with PMH as below, which is significant for CKD III, DM, endometrial ca (two years post completion of adjuvant therapy), HTN, and hypothyroid. She was in her usual state of health until approximately July 4th of this year, when she developed slurred speech and then left sided weakness the following day which caused her to present to [facility] ED on 7/6. Head CT demonstrated mass likely to be meningioma with vasogenic edema in the L frontal lobe. Neurosurgery was consulted and started steroids. MRI brain showed large acute right pontine infarct secondary to stenosis of the basilar artery. There was felt to be high risk of recurrent stroke due to the stenosis, so IR was asked to evaluate. On 7/13 she was taken for basilar artery stent. The procedure was uncomplicated, however, she was noted to have some peripheral edema and for this reason the decision was made not to extubate. PCCM consulted for ICU and ventilatory management.   Pertinent  Medical History   has a past medical history of Arthritis, CKD (chronic kidney disease), stage III ([place]), Depression, Diabetes mellitus without complication ([place]), Difficult intravenous access, Endometrial cancer ([place]), GERD (gastroesophageal reflux disease), Headache, History of radiation therapy (11/04/2019-12/01/2019), Hypertension, Hypothyroidism, Neuromuscular disorder ([place]), Osteoarthritis, PMB (postmenopausal bleeding), PONV (postoperative nausea and vomiting), Urinary frequency, and Wears glasses.   Significant Hospital Events: Including procedures, antibiotic start and stop dates in addition to other pertinent events   7/6 admit for meningioma, MRI discovered pontine stroke/basilar stenosis 7/9 IR consulted. Plans for  basilar stent 7/13 to IR for stent. Post op to ICU on vent.   Interim History / Subjective:  No overnight issues Tolerating spontaneous breathing trial  Objective   Blood pressure 135/63, pulse 73, temperature 97.7 F (36.5 C), temperature source Axillary, resp. rate 17, height '5\' 5"'$  (1.651 m), weight 76.7 kg, SpO2 100 %.    Vent Mode: PSV;CPAP FiO2 (%):  [40 %] 40 % Set Rate:  [14 bmp] 14 bmp Vt Set:  [450 mL] 450 mL PEEP:  [5 cmH20] 5 cmH20 Pressure Support:  [8 cmH20] 8 cmH20 Plateau Pressure:  [9 cmH20-18 cmH20] 11 cmH20   Intake/Output Summary (Last 24 hours) at 11/02/2021 0856 Last data filed at 11/02/2021 0600 Gross per 24 hour  Intake 2740.78 ml  Output 2150 ml  Net 590.78 ml   Filed Weights   10/25/21 1531 11/01/21 1042  Weight: 76.7 kg 76.7 kg    Examination:  Physical exam: General: Crtitically ill-appearing female, orally intubated HEENT: [place]/AT, eyes anicteric.  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 S/p basilar artery stent placement 7/13 Continue neuro watch every hour Continue secondary stroke prophylaxis, patient is on heparin infusion, aspirin and Plavix Continue statin SBP goal 120-140 Clevidipine, phenylephrine as needed for above goal Started on amlodipine Heparin infusion for stent patency per neuro IR  Acute postop respiratory insufficiency  Patient remained intubated status post basilar artery stent She is tolerating spontaneous breathing trial Sedation was turned  off We will try to extubate her   Left frontal meningioma with cerebral edema and brain compression: MRI left anterior cranial fossa meningioma with vasogenic edema and midline shift Followed by neurosurgery No plan for resection of tumor on this visit, possible resection in 8-12 weeks.  Continue decadron.   AKI on CKD stage III a Serum creatinine improved, now at baseline Avoid nephrotoxic agents Monitor intake and output  HTN HLD Hold home irbesartan while on tight BP goals above and considering AKI Continue fenofibrate, crestor  DM2 Closely monitor fingerstick with goal 140-180 Continue Semglee 38 units  Continue to hold metformin Continue sliding scale insulin  Dysphagia SLP following Nectar thick Aspiration precautions  Best Practice (right click and "Reselect all SmartList Selections" daily)   Diet/type: NPO DVT prophylaxis: systemic heparin GI prophylaxis: PPI Lines: Central line and Arterial Line Foley:  Yes, and it is still needed Code Status:  full code Last date of multidisciplinary goals of care discussion [pending]  Labs   CBC: Recent Labs  Lab 10/28/21 0230 10/29/21 0342 10/30/21 0449 10/31/21 0108 11/01/21 0309 11/01/21 1818 11/01/21 1845 11/02/21 0528  WBC 9.0   < > 9.3 10.5 9.9 8.7  --  10.9*  NEUTROABS 7.3  --   --  8.5* 8.2* 7.2  --  8.9*  HGB 15.4*   < > 14.2 13.9 13.6 13.1 12.9 13.1  HCT 45.0   < > 42.0 40.5 39.7 39.4 38.0 40.0  MCV 88.6   < > 89.9 90.0 89.2 92.5  --  92.8  PLT 159   < > 156 137* 148* 136*  --  178   < > = values in this interval not displayed.    Basic Metabolic Panel: Recent Labs  Lab 10/29/21 0342 10/30/21 0449 10/31/21 0108 11/01/21 0309 11/01/21 1845 11/02/21 0528  NA 138 138 135 134* 139 138  K 4.7 4.5 4.7 4.9 4.5 4.3  CL 100 102 99 99  --  103  CO2 '24 26 26 25  '$ --  22  GLUCOSE 228* 221* 277* 265*  --  175*  BUN 44* 45* 48* 55*  --  45*  CREATININE 1.43* 1.22* 1.43* 1.69*  --  1.09*  CALCIUM 9.6  9.1 9.0 9.1  --  8.2*  MG  --   --  2.3 2.2  --  2.0  PHOS  --   --   --  3.6  --  4.2   GFR: Estimated Creatinine Clearance: 40.8 mL/min (A) (by C-G formula based on SCr of 1.09 mg/dL (H)). Recent Labs  Lab 10/31/21 0108 11/01/21 0309 11/01/21 1818 11/02/21 0528  WBC 10.5 9.9 8.7 10.9*    Liver Function Tests: Recent Labs  Lab 11/01/21 0309 11/02/21 0528  AST 12* 13*  ALT 10 10  ALKPHOS 59 49  BILITOT 0.4 0.3  PROT 5.8* 5.6*  ALBUMIN 2.8* 2.7*   No results for input(s): "LIPASE", "AMYLASE" in the last 168 hours. No results for input(s): "AMMONIA" in the last 168 hours.  ABG    Component Value Date/Time   PHART 7.372 11/01/2021 1845   PCO2ART 42.4 11/01/2021 1845   PO2ART 141 (H) 11/01/2021 1845   HCO3 24.7 11/01/2021 1845   TCO2 26 11/01/2021 1845   ACIDBASEDEF 1.0 11/01/2021 1845   O2SAT 99 11/01/2021 1845     Coagulation Profile: Recent Labs  Lab 10/30/21 0449 11/01/21 1127  INR 1.2 1.2    Cardiac Enzymes: No results for input(s): "CKTOTAL", "CKMB", "CKMBINDEX", "TROPONINI" in the last 168 hours.  HbA1C: Hemoglobin A1C  Date/Time Value Ref Range Status  01/23/2015 12:00 AM 7.6  Final   Hgb A1c MFr Bld  Date/Time Value Ref Range Status  10/26/2021 02:25 AM 7.0 (H) 4.8 - 5.6 % Final    Comment:    (NOTE) Pre diabetes:          5.7%-6.4%  Diabetes:              >6.4%  Glycemic control for   <7.0% adults with diabetes   09/03/2019 03:27 PM 7.9 (H) 4.8 - 5.6 % Final    Comment:    (NOTE) Pre diabetes:          5.7%-6.4% Diabetes:              >6.4% Glycemic control for   <7.0% adults with diabetes     CBG: Recent Labs  Lab 11/01/21 1236 11/01/21 2003 11/01/21 2305 11/02/21 0300 11/02/21 0728  GLUCAP 127* 104* 196* 192* 145*    Critical care time:      Total critical care time: 37 minutes  Performed by: [place] care time was exclusive of separately billable procedures and treating other patients.    Critical care was necessary to treat or prevent imminent or life-threatening deterioration.   Critical care was time spent personally by me on the following activities: development of treatment plan with patient and/or surrogate as well as nursing, discussions with consultants, evaluation of patient's response to treatment, examination of patient, obtaining history from patient or surrogate, ordering and performing treatments and interventions, ordering and review of laboratory studies, ordering and review of radiographic studies, pulse oximetry and re-evaluation of patient's condition.   Jacky Kindle, MD [facility] See Amion for pager If no response to pager, please call [phone] until 7pm After 7pm, Please call E-link [phone]

## 2021-11-02 NOTE — Progress Notes (Signed)
Referring Physician(s): Rosalin Hawking, MD  Supervising Physician: Luanne Bras  Patient Status:  [facility] - In-pt  Chief Complaint:  S/p endovascular revascularization of severely stenotic distal basilar artery with stent assisted angioplasty   Subjective:  Pt mechanically ventilated.   Allergies: Anesthetics, amide and Ether  Medications: Prior to Admission medications   Medication Sig Start Date End Date Taking? Authorizing Provider  ALPRAZolam Duanne Moron) 0.5 MG tablet Take 0.25-0.5 mg by mouth at bedtime as needed for sleep. 09/21/21  Yes [provider]  aspirin EC 81 MG tablet Take 81 mg by mouth daily. Swallow whole.   Yes [provider]  calcium carbonate (OSCAL) 1500 (600 Ca) MG TABS tablet Take 1,500 mg by mouth daily with breakfast.   Yes [provider]  fenofibrate 160 MG tablet Take 160 mg by mouth daily after breakfast.  05/12/16  Yes [provider]  HUMALOG MIX 75/25 KWIKPEN (75-25) 100 UNIT/ML Kwikpen Inject 30 Units into the skin in the morning and at bedtime. 06/08/16  Yes [provider]  metFORMIN (GLUCOPHAGE) 1000 MG tablet Take 1,000 mg by mouth 2 (two) times daily.  01/21/15  Yes [provider]  telmisartan (MICARDIS) 80 MG tablet Take 80 mg by mouth daily after breakfast.  06/01/19  Yes [provider]     Vital Signs: BP 135/63   Pulse 73   Temp 97.7 F (36.5 C) (Axillary)   Resp 17   Ht '5\' 5"'$  (1.651 m)   Wt 169 lb 1.5 oz (76.7 kg)   SpO2 100%   BMI 28.14 kg/m   Physical Exam Vitals reviewed.  Constitutional:      General: She is not in acute distress.    Appearance: She is ill-appearing.  HENT:     Head: Normocephalic and atraumatic.  Cardiovascular:     Rate and Rhythm: Normal rate.  Pulmonary:     Comments: Mechanically ventilated Musculoskeletal:     Right lower leg: Edema present.     Left lower leg: Edema present.  Skin:    General: Skin is warm and dry.     Comments:  R radial puncture site with large anterior forearm hematoma that has not increased in size. Right radial pulse WNL  Neurological:     Comments: Can spontaneously move all 4 extremities. Hand grip strength right 5/5, left 3/5 Pt mechanically ventilated, sedation withdrawn for impending extubation.              Imaging: DG CHEST PORT 1 VIEW  Result Date: 11/01/2021 CLINICAL DATA:  Check insertion of support tubes. Ventilator dependent respiratory failure. EXAM: PORTABLE CHEST 1 VIEW COMPARISON:  Portable chest earlier today at 6:35 p.m., and abdomen and pelvis CT no contrast 03/30/2019. FINDINGS: Chest: 8:19 p.m. interval ETT pullback to 3.4 cm from the carina. Interval NGT insertion which is better demonstrated on the abdomen film. Right IJ central line tip again is seen in the SVC at the azygous confluence. There is no pneumothorax. The lungs are generally clear. There is aortic tortuosity and atherosclerosis mild aortic ectasia, small hiatal hernia. Heart size and vascular pattern are normal. No pleural effusion is seen. Osteopenia and thoracic spondylosis, degenerative change both shoulders. Abdomen: NGT is well below the diaphragm. The tip superimposes to the right of L5 and is probably in the distal gastric antrum, although the course of the tube is more vertical than expected based on the positioning of the stomach on the prior CT. There is no supine evidence  of free air. The bowel pattern is nonobstructive with moderate fecal stasis. The visceral shadows are stable with no visible pathologic calcification although with most of the pelvic area excluded from the exam. Contrast in the kidneys is noted with excretion into the collecting systems. No hydronephrosis. There is osteopenia, degenerative changes and slight levoscoliosis of the lumbar spine. IMPRESSION: 1. Interval ETT pullback to 3.4 cm from the carina. 2. NGT tip superimposed to the right of L5 and more vertical in orientation than  expected based on the position of the stomach on the prior CT, probably in the distal gastric antrum. Did the patient have a gastric bypass since the prior CT? 3. No evidence of acute chest disease. 4. Constipation. Electronically Signed   By: Telford Nab M.D.   On: 11/01/2021 21:04   DG Abd Portable 1V  Result Date: 11/01/2021 CLINICAL DATA:  Check insertion of support tubes. Ventilator dependent respiratory failure. EXAM: PORTABLE CHEST 1 VIEW COMPARISON:  Portable chest earlier today at 6:35 p.m., and abdomen and pelvis CT no contrast 03/30/2019. FINDINGS: Chest: 8:19 p.m. interval ETT pullback to 3.4 cm from the carina. Interval NGT insertion which is better demonstrated on the abdomen film. Right IJ central line tip again is seen in the SVC at the azygous confluence. There is no pneumothorax. The lungs are generally clear. There is aortic tortuosity and atherosclerosis mild aortic ectasia, small hiatal hernia. Heart size and vascular pattern are normal. No pleural effusion is seen. Osteopenia and thoracic spondylosis, degenerative change both shoulders. Abdomen: NGT is well below the diaphragm. The tip superimposes to the right of L5 and is probably in the distal gastric antrum, although the course of the tube is more vertical than expected based on the positioning of the stomach on the prior CT. There is no supine evidence of free air. The bowel pattern is nonobstructive with moderate fecal stasis. The visceral shadows are stable with no visible pathologic calcification although with most of the pelvic area excluded from the exam. Contrast in the kidneys is noted with excretion into the collecting systems. No hydronephrosis. There is osteopenia, degenerative changes and slight levoscoliosis of the lumbar spine. IMPRESSION: 1. Interval ETT pullback to 3.4 cm from the carina. 2. NGT tip superimposed to the right of L5 and more vertical in orientation than expected based on the position of the stomach on  the prior CT, probably in the distal gastric antrum. Did the patient have a gastric bypass since the prior CT? 3. No evidence of acute chest disease. 4. Constipation. Electronically Signed   By: Telford Nab M.D.   On: 11/01/2021 21:04   Portable Chest x-ray  Result Date: 11/01/2021 CLINICAL DATA:  ET tube EXAM: PORTABLE CHEST 1 VIEW COMPARISON:  Chest x-ray 06/11/2019 FINDINGS: Endotracheal tube tip is 7 mm above the carina. Right-sided central venous catheter tip projects over the SVC. The lungs are clear. There is no pleural effusion or pneumothorax. The cardiomediastinal silhouette is within normal limits. No acute fractures are seen. IMPRESSION: 1. Endotracheal tube tip is 7 mm above the carina. Consider repositioning. 2. Right-sided central venous catheter tip projects over the SVC. 3. The lungs are clear. Electronically Signed   By: Ronney Asters M.D.   On: 11/01/2021 18:46    Labs:  CBC: Recent Labs    10/31/21 0108 11/01/21 0309 11/01/21 1818 11/01/21 1845 11/02/21 0528  WBC 10.5 9.9 8.7  --  10.9*  HGB 13.9 13.6 13.1 12.9 13.1  HCT 40.5 39.7 39.4  38.0 40.0  PLT 137* 148* 136*  --  178    COAGS: Recent Labs    10/25/21 1540 10/30/21 0449 11/01/21 1127  INR 1.1 1.2 1.2  APTT 25  --   --     BMP: Recent Labs    10/30/21 0449 10/31/21 0108 11/01/21 0309 11/01/21 1845 11/02/21 0528  NA 138 135 134* 139 138  K 4.5 4.7 4.9 4.5 4.3  CL 102 99 99  --  103  CO2 '26 26 25  '$ --  22  GLUCOSE 221* 277* 265*  --  175*  BUN 45* 48* 55*  --  45*  CALCIUM 9.1 9.0 9.1  --  8.2*  CREATININE 1.22* 1.43* 1.69*  --  1.09*  GFRNONAA 44* 37* 30*  --  51*    LIVER FUNCTION TESTS: Recent Labs    10/25/21 1540 10/26/21 0225 11/01/21 0309 11/02/21 0528  BILITOT 0.5 0.8 0.4 0.3  AST 12* 15 12* 13*  ALT '11 14 10 10  '$ ALKPHOS 51 51 59 49  PROT 6.9 6.7 5.8* 5.6*  ALBUMIN 4.1 3.6 2.8* 2.7*    Assessment and Plan:  Pt mechanically ventilated w/o sedation.  She is able to  follow commands Right hand strength 5/5, left 3/5  Plan to extubate today.  P2Y12 ordered.   NIR to follow. Please contact NIR with questions/concerns.   Electronically Signed: Tyson Alias, NP 11/02/2021, 9:28 AM   I spent a total of 15 Minutes at the the patient's bedside AND on the patient's hospital floor or unit, greater than 50% of which was counseling/coordinating care for stenotic distal basilar artery.

## 2021-11-02 NOTE — Plan of Care (Signed)
  Problem: Pain Managment: Goal: General experience of comfort will improve Outcome: Progressing   

## 2021-11-02 NOTE — Procedures (Signed)
Extubation Procedure Note  Patient Details:   Name: Cassandra Dodson DOB: May 12, 1940 MRN: [id number]   Airway Documentation:    Vent end date: 11/02/21 Vent end time: 0935   Evaluation  O2 sats: stable throughout Complications: No apparent complications Patient did tolerate procedure well. Bilateral Breath Sounds: Clear, Diminished   Yes, Pt is able to speak and state name. Audible cuffleak was confirmed prior extubation. Bilateral breath sounds were heard and no signs of stridor at this time. Pt is currently stable on 3L [place].  Felecia Jan 11/02/2021, 9:44 AM

## 2021-11-02 NOTE — Evaluation (Addendum)
Physical Therapy Re-Evaluation Patient Details Name: Cassandra Dodson MRN: [id number] DOB: 1940-10-13 Today's Date: 11/02/2021  History of Present Illness  Pt is an 81 y/o female admitted secondary to slurred speech and L sided weakness. 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 PMH including but not limited to HTN, DM, CKD3, GERD and endometrial cancer.   Clinical Impression  The pt was agreeable to limited session at this time after stent placement yesterday. She presents with increased deficits in strength, endurance, stability, and coordination of LLE movements. The pt required modA to complete transition from supine to sitting (previously minG), and modA to complete static sitting at EOB due to repeated LOB to L and forwards. The pt also noted intermittent double vision through session, but reported improved when returned to supine. The pt had been walking with use of RW and min-mod assist prior to surgery, will need continued skilled PT acutely and after d/c to maximize functional recovery and safety with movement.        Recommendations for follow up therapy are one component of a multi-disciplinary discharge planning process, led by the attending physician.  Recommendations may be updated based on patient status, additional functional criteria and insurance authorization.  Follow Up Recommendations Acute inpatient rehab (3hours/day)      Assistance Recommended at Discharge Frequent or constant Supervision/Assistance  Patient can return home with the following  Assistance with cooking/housework;Direct supervision/assist for medications management;Direct supervision/assist  for financial management;Assist for transportation;Help with stairs or ramp for entrance;A lot of help with walking and/or transfers;A little help with bathing/dressing/bathroom    Equipment Recommendations None recommended by PT  Recommendations for Other Services  Rehab consult    Functional Status Assessment Patient has had a recent decline in their functional status and demonstrates the ability to make significant improvements in function in a reasonable and predictable amount of time.     Precautions / Restrictions Precautions Precautions: Fall Precaution Comments: urinary incontinence Restrictions Weight Bearing Restrictions: No      Mobility  Bed Mobility Overal bed mobility: Needs Assistance Bed Mobility: Supine to Sit, Sit to Supine     Supine to sit: Mod assist, HOB elevated Sit to supine: Max assist, +2 for physical assistance   General bed mobility comments: modA to complete movement to EOB with sequential cues for LE and assist to elevate trunk. maxA to return to supine with +2    Transfers                   General transfer comment: deferred due to level of assist needed for static sitting, pt requesting to return to supine      Modified Rankin (Stroke Patients Only) Modified Rankin (Stroke Patients Only) Pre-Morbid Rankin Score: Moderately severe disability Modified Rankin: Severe disability     Balance Overall balance assessment: Needs assistance Sitting-balance support: Feet supported Sitting balance-Leahy Scale: Poor Sitting balance - Comments: falling to L and forwards without modA Postural control: Left lateral lean, Posterior lean     Standing balance comment: pt unable to attempt standing  Pertinent Vitals/Pain Pain Assessment Pain Assessment: No/denies pain    Home Living Family/patient expects to be discharged to:: Private residence Living Arrangements: Alone Available Help at Discharge:  Family;Friend(s);Available PRN/intermittently Type of Home: Apartment Home Access: Level entry       Home Layout: One level Home Equipment: Conservation officer, nature (2 wheels);Cane - single point;Shower seat      Prior Function Prior Level of Function : Patient poor historian/Family not available             Mobility Comments: pt stating that she ambulates with use of a RW ADLs Comments: requires assistance from her son or an aide to get dressed and bathe. stated that she has an aide 2-3x/wk for "a few hours"     Hand Dominance   Dominant Hand: Right    Extremity/Trunk Assessment   Upper Extremity Assessment Upper Extremity Assessment: LUE deficits/detail LUE Deficits / Details: edema in LUE, placed on elevated surface at end of session, limited ROM and increased cues to attend to position. LUE Sensation: decreased light touch;decreased proprioception LUE Coordination: decreased fine motor;decreased gross motor    Lower Extremity Assessment Lower Extremity Assessment: RLE deficits/detail;LLE deficits/detail RLE Deficits / Details: grossly functional against gravity, able to move at knee, hip, and ankle without issue. LLE Deficits / Details: pt grossly 3-/5, unable to move against gravity, falling into hip adduction with attempt to SLR, cues for awareness and positioning. LLE Sensation: decreased light touch LLE Coordination: decreased fine motor;decreased gross motor    Cervical / Trunk Assessment Cervical / Trunk Assessment: Kyphotic;Other exceptions Cervical / Trunk Exceptions: increased body habitus  Communication   Communication: HOH  Cognition Arousal/Alertness: Lethargic Behavior During Therapy: Flat affect Overall Cognitive Status: Impaired/Different from baseline Area of Impairment: Orientation, Attention, Following commands, Safety/judgement, Awareness, Problem solving                 Orientation Level: Disoriented to, Time Current Attention Level:  Focused Memory: Decreased short-term memory Following Commands: Follows one step commands inconsistently, Follows one step commands with increased time Safety/Judgement: Decreased awareness of deficits Awareness: Intellectual Problem Solving: Slow processing, Decreased initiation, Difficulty sequencing General Comments: pt more lethargic at this time and some aspects of cognition are difficult to assess due to dysarthria. The pt attempted to follow simple commands, but requires increased time, max cues for positioning and safety with sitting EOB        General Comments General comments (skin integrity, edema, etc.): VSS on RA    Exercises General Exercises - Lower Extremity Long Arc Quad: AROM, Right, AAROM, Left, 10 reps, Seated Straight Leg Raises: AROM, Right, AAROM, Left, 5 reps, Supine Hip Flexion/Marching: AROM, Both, 5 reps, Seated   Assessment/Plan    PT Assessment Patient needs continued PT services  PT Problem List Decreased strength;Decreased range of motion;Decreased activity tolerance;Decreased balance;Decreased mobility;Decreased coordination;Decreased cognition;Decreased knowledge of use of DME;Decreased safety awareness;Decreased knowledge of precautions;Cardiopulmonary status limiting activity       PT Treatment Interventions DME instruction;Gait training;Stair training;Functional mobility training;Therapeutic activities;Therapeutic exercise;Neuromuscular re-education;Balance training;Cognitive remediation;Patient/family education    PT Goals (Current goals can be found in the Care Plan section)  Acute Rehab PT Goals Patient Stated Goal: to rest and then get better PT Goal Formulation: With patient/family Time For Goal Achievement: 11/16/21 Potential to Achieve Goals: Good    Frequency Min 4X/week        AM-PAC PT "6 Clicks" Mobility  Outcome Measure Help needed turning from your back to your side while in a  flat bed without using bedrails?: A Lot Help  needed moving from lying on your back to sitting on the side of a flat bed without using bedrails?: A Lot Help needed moving to and from a bed to a chair (including a wheelchair)?: Total Help needed standing up from a chair using your arms (e.g., wheelchair or bedside chair)?: Total Help needed to walk in hospital room?: Total Help needed climbing 3-5 steps with a railing? : Total 6 Click Score: 8    End of Session Equipment Utilized During Treatment: Gait belt Activity Tolerance: Patient tolerated treatment well;Patient limited by fatigue Patient left: in bed;with call bell/phone within reach;with bed alarm set Nurse Communication: Mobility status PT Visit Diagnosis: Other abnormalities of gait and mobility (R26.89);Other symptoms and signs involving the nervous system (R29.898)    Time: 1561-5379 PT Time Calculation (min) (ACUTE ONLY): 23 min   Charges:   PT Evaluation $PT Eval Moderate Complexity: 1 Mod PT Treatments $Therapeutic Activity: 8-22 mins        [place] Carbo, PT, DPT   Acute Rehabilitation Department  Sandra Cockayne 11/02/2021, 4:05 PM

## 2021-11-02 NOTE — Progress Notes (Signed)
ANTICOAGULATION CONSULT NOTE   Pharmacy Consult:  Heparin Indication:  Post Neuro IR procedure  Allergies  Allergen Reactions   Anesthetics, Amide Other (See Comments)    Pt is intolerant to general anesthesia. Pt will throw up and has thrown up during procedure.    Ether Nausea And Vomiting    Patient Measurements: Height: '5\' 5"'$  (165.1 cm) Weight: 76.7 kg (169 lb 1.5 oz) IBW/kg (Calculated) : 57 Heparin Dosing Weight: 76 kg  Vital Signs: Temp: 97.7 F (36.5 C) (07/14 0000) Temp Source: Oral (07/14 0000) BP: 149/69 (07/14 0300) Pulse Rate: 77 (07/14 0300)  Labs: Recent Labs    10/30/21 0449 10/31/21 0108 11/01/21 0309 11/01/21 1127 11/01/21 1818 11/01/21 1845 11/02/21 0247  HGB 14.2 13.9 13.6  --  13.1 12.9  --   HCT 42.0 40.5 39.7  --  39.4 38.0  --   PLT 156 137* 148*  --  136*  --   --   LABPROT 15.0  --   --  15.4*  --   --   --   INR 1.2  --   --  1.2  --   --   --   HEPARINUNFRC  --   --   --   --   --   --  0.12*  CREATININE 1.22* 1.43* 1.69*  --   --   --   --      Estimated Creatinine Clearance: 26.3 mL/min (A) (by C-G formula based on SCr of 1.69 mg/dL (H)).   Medical History: Past Medical History:  Diagnosis Date   Arthritis    CKD (chronic kidney disease), stage III ([place])    patient denies   Depression    Diabetes mellitus without complication ([place])    Difficult intravenous access    Endometrial cancer ([place])    GERD (gastroesophageal reflux disease)    Headache    History of radiation therapy 11/04/2019-12/01/2019   Endometrial HDR; Dr. Gery Pray   Hypertension    Hypothyroidism    Neuromuscular disorder ([place])    neuropathy in feet   Osteoarthritis    PMB (postmenopausal bleeding)    PONV (postoperative nausea and vomiting)    severe nausea and vomiting after knee replacement 06-2014, did ok with 2018 knee replacement   Urinary frequency    Wears glasses     Assessment: 24 YOF with right paramedian pontine infarct and severe  stenosis of the basilar artery.  Now s/p basilar artery stenting.  Noted hematoma expansion at TR band site earlier today and is still present per discussion with RN.  Spoke to Dr. Estanislado Pandy, start low-dose IV heparin at 2000 tonight.  MD aware TR band has not been removed.  CBC stable.  7/14 AM update:  Heparin level within therapeutic range  Goal of Therapy:  Heparin level 0.1-0.25 units/ml Monitor platelets by anticoagulation protocol: Yes   Plan:  Cont heparin at 500 units/hr Heparin off this AM at 0800 per order admin instructions   Narda Bonds, PharmD, [place] Pharmacist Phone: [phone]

## 2021-11-02 NOTE — TOC CAGE-AID Note (Signed)
Transition of Care [facility]) - CAGE-AID Screening   Patient Details  Name: Cassandra Dodson MRN: [id number] Date of Birth: Feb 27, 1941  [facility] CM/SW Contact:    Benard Halsted, LCSW Phone Number: 11/02/2021, 11:11 AM   Clinical Narrative: Patient unable to participate in screening at this time.    CAGE-AID Screening: Substance Abuse Screening unable to be completed due to: : Patient unable to participate             Substance Abuse Education Offered: No

## 2021-11-02 NOTE — Progress Notes (Addendum)
STROKE TEAM PROGRESS NOTE   INTERVAL HISTORY Son is at the bedside. She has been extubated. She is on Cleviprex drip @ '10mg'$ . Patient sleeping, awakens to voice. She is alert and oriented x 4. No aphasia, hypophonic speech.Slight left facial droop, tongue mid line. some dysarthria. She follows commands, Right eye INO. Bilateral nystagmus noted on bilateral lateral gaze. Denies double vision. Right upper 4/5, Left arm falls to bed 3/5. Can wiggle toes, left leg weakness. Sensation appears intact. Norvasc '10mg'$  started by today.   Vitals:   11/02/21 0630 11/02/21 0645 11/02/21 0700 11/02/21 0733  BP:   135/63 135/63  Pulse: 80 77 77 73  Resp: '10 12 13 17  '$ Temp:      TempSrc:      SpO2: 100% 98% 100% 100%  Weight:      Height:       CBC:  Recent Labs  Lab 11/01/21 1818 11/01/21 1845 11/02/21 0528  WBC 8.7  --  10.9*  NEUTROABS 7.2  --  8.9*  HGB 13.1 12.9 13.1  HCT 39.4 38.0 40.0  MCV 92.5  --  92.8  PLT 136*  --  867    Basic Metabolic Panel:  Recent Labs  Lab 11/01/21 0309 11/01/21 1845 11/02/21 0528  NA 134* 139 138  K 4.9 4.5 4.3  CL 99  --  103  CO2 25  --  22  GLUCOSE 265*  --  175*  BUN 55*  --  45*  CREATININE 1.69*  --  1.09*  CALCIUM 9.1  --  8.2*  MG 2.2  --  2.0  PHOS 3.6  --  4.2    Lipid Panel:  Recent Labs  Lab 10/27/21 0234 11/02/21 0528  CHOL 198  --   TRIG 59 147  HDL 53  --   CHOLHDL 3.7  --   VLDL 12  --   LDLCALC 133*  --     HgbA1c:  No results for input(s): "HGBA1C" in the last 168 hours.  Urine Drug Screen: No results for input(s): "LABOPIA", "COCAINSCRNUR", "LABBENZ", "AMPHETMU", "THCU", "LABBARB" in the last 168 hours.  Alcohol Level  No results for input(s): "ETH" in the last 168 hours.   IMAGING past 24 hours DG CHEST PORT 1 VIEW  Result Date: 11/01/2021 CLINICAL DATA:  Check insertion of support tubes. Ventilator dependent respiratory failure. EXAM: PORTABLE CHEST 1 VIEW COMPARISON:  Portable chest earlier today at 6:35  p.m., and abdomen and pelvis CT no contrast 03/30/2019. FINDINGS: Chest: 8:19 p.m. interval ETT pullback to 3.4 cm from the carina. Interval NGT insertion which is better demonstrated on the abdomen film. Right IJ central line tip again is seen in the SVC at the azygous confluence. There is no pneumothorax. The lungs are generally clear. There is aortic tortuosity and atherosclerosis mild aortic ectasia, small hiatal hernia. Heart size and vascular pattern are normal. No pleural effusion is seen. Osteopenia and thoracic spondylosis, degenerative change both shoulders. Abdomen: NGT is well below the diaphragm. The tip superimposes to the right of L5 and is probably in the distal gastric antrum, although the course of the tube is more vertical than expected based on the positioning of the stomach on the prior CT. There is no supine evidence of free air. The bowel pattern is nonobstructive with moderate fecal stasis. The visceral shadows are stable with no visible pathologic calcification although with most of the pelvic area excluded from the exam. Contrast in the kidneys is noted with excretion  into the collecting systems. No hydronephrosis. There is osteopenia, degenerative changes and slight levoscoliosis of the lumbar spine. IMPRESSION: 1. Interval ETT pullback to 3.4 cm from the carina. 2. NGT tip superimposed to the right of L5 and more vertical in orientation than expected based on the position of the stomach on the prior CT, probably in the distal gastric antrum. Did the patient have a gastric bypass since the prior CT? 3. No evidence of acute chest disease. 4. Constipation. Electronically Signed   By: Telford Nab M.D.   On: 11/01/2021 21:04   DG Abd Portable 1V  Result Date: 11/01/2021 CLINICAL DATA:  Check insertion of support tubes. Ventilator dependent respiratory failure. EXAM: PORTABLE CHEST 1 VIEW COMPARISON:  Portable chest earlier today at 6:35 p.m., and abdomen and pelvis CT no contrast  03/30/2019. FINDINGS: Chest: 8:19 p.m. interval ETT pullback to 3.4 cm from the carina. Interval NGT insertion which is better demonstrated on the abdomen film. Right IJ central line tip again is seen in the SVC at the azygous confluence. There is no pneumothorax. The lungs are generally clear. There is aortic tortuosity and atherosclerosis mild aortic ectasia, small hiatal hernia. Heart size and vascular pattern are normal. No pleural effusion is seen. Osteopenia and thoracic spondylosis, degenerative change both shoulders. Abdomen: NGT is well below the diaphragm. The tip superimposes to the right of L5 and is probably in the distal gastric antrum, although the course of the tube is more vertical than expected based on the positioning of the stomach on the prior CT. There is no supine evidence of free air. The bowel pattern is nonobstructive with moderate fecal stasis. The visceral shadows are stable with no visible pathologic calcification although with most of the pelvic area excluded from the exam. Contrast in the kidneys is noted with excretion into the collecting systems. No hydronephrosis. There is osteopenia, degenerative changes and slight levoscoliosis of the lumbar spine. IMPRESSION: 1. Interval ETT pullback to 3.4 cm from the carina. 2. NGT tip superimposed to the right of L5 and more vertical in orientation than expected based on the position of the stomach on the prior CT, probably in the distal gastric antrum. Did the patient have a gastric bypass since the prior CT? 3. No evidence of acute chest disease. 4. Constipation. Electronically Signed   By: Telford Nab M.D.   On: 11/01/2021 21:04   Portable Chest x-ray  Result Date: 11/01/2021 CLINICAL DATA:  ET tube EXAM: PORTABLE CHEST 1 VIEW COMPARISON:  Chest x-ray 06/11/2019 FINDINGS: Endotracheal tube tip is 7 mm above the carina. Right-sided central venous catheter tip projects over the SVC. The lungs are clear. There is no pleural effusion or  pneumothorax. The cardiomediastinal silhouette is within normal limits. No acute fractures are seen. IMPRESSION: 1. Endotracheal tube tip is 7 mm above the carina. Consider repositioning. 2. Right-sided central venous catheter tip projects over the SVC. 3. The lungs are clear. Electronically Signed   By: Ronney Asters M.D.   On: 11/01/2021 18:46    PHYSICAL EXAM General:  Alert, well-nourished, well-developed patient in no acute distress Respiratory:  Regular, unlabored respirations  NEURO:  Son is at the bedside. She has been extubated. She is on Cleviprex drip @ '10mg'$ . Patient sleeping, awakens to voice. She is alert and oriented x 4. No aphasia, hypophonic speech.Slight left facial droop, tongue mid line. some dysarthria. She follows commands, Right eye INO. Bilateral nystagmus noted on bilateral lateral gaze. Denies double vision. Right upper 4/5, Left  arm falls to bed 3/5. Can wiggle toes, left leg weakness. Sensation appears intact.    ASSESSMENT/PLAN [name] is a 82 y.o. female with history of  HTN, DM, CKD3, GERD and endometrial cancer  presenting with slurred speech for two days and left leg weakness for one day.  Patient's son states that she first seemed to be having problems on 7/4 when she had some difficulty with ambulation, then developed slurred speech and dragging of the left leg while walking.  [place] shows a left paracentral meningioma with vasogenic edema, and MRI brain shows a right paramedian pontine stroke. Patient denies any prior known history of meningioma or brain problems.  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  Therapy recommendations:  CIR Disposition:  pending  Basilar artery stenosis CTA head & neck marked stenosis of BA proximal to SCAs Per son, pt baseline walks with walker due to left hip and b/l knee replacement but lives independently PTA Now with mild left facial droop and left UE drift post stroke Given high risk of recurrent stroke with devastating outcome, basilar artery stent assisted angioplasty on 7/13. Off heparin IV, on DAPT  Left frontal large meningioma CT head 5.0 cm intracranial mass along anterior falx, likely to be a meningioma with vasogenic edema and 2 cm localized midline shift MRI left anterior cranial fossa meningioma with vasogenic edema and MLS Neurosurgery on board Plan for possible surgical resection in 8-12 weeks first On decardron  Hypertension Home meds:  telmisartan 80 mg daily Goal 120-150. After 24 hr of IR BP goal < 180 Currently on Cleviprex drip @ '10mg'$ , taper off as able Avapro discontinued  Norvasc '10mg'$  started today  Long-term BP goal normotensive  Hyperlipidemia Home meds:  fenofibrate 160 mg daily, resumed in hospital LDL 133, goal < 70 Continue rosuvastatin 20 mg daily  Continue statin at discharge  Diabetes type II Uncontrolled Home meds:  metformin 1000 mg BID HgbA1c 7.0, goal < 7.0 CBGs SSI On insulin now Hyperglycemia especially on decardron now, improved Close PCP follow up for better DM control in the setting of steroid use  Dysphagia Speech on board Now on nectar thick liquid Aspiration precaution  AKI on CKD 3a Cre 1.3->1.43->1.22->1.43->1.69->1.09 Avoid dehydration  Other Stroke Risk  Factors Advanced Age >/= 74  Former cigarette smoker  Other Active Problems Hx of left hip and b/l Knee replacement   Hospital day # 7  [name] DNP, ACNPC-AG  ATTENDING NOTE: I reviewed above note and agree with the assessment and plan. Pt was seen and examined.   Son at the bedside. Pt extubated this am, still very lethargic, open eyes on voice, awake, alert, eyes open, orientated to age, place, and people. No aphasia but paucity of speech and hypophonia, able to follow all simple commands. Right eye INO and left eye  abduction nystagmus. B/l eye right gaze also mild horizontal nystagmus. Tracking bilaterally, blinking to visual threat bilaterally. Left facial droop. Tongue midline. RUE 4/5 against gravity without drift. LUE 3-/5 with quick drift. RLE knee flexion 3/5 and ankle PF/DF 4/5. LLE knee flexion 2/5 and ankle PF/DF 3/5. Sensation symmetrical bilaterally subjectively, right FTN intact grossly, gait not tested.   Pt had basilar artery stenting yesterday with Dr. Estanislado Pandy, today's exam seems worse than pre-IR with more right INO and left hemiparesis. However, it seems more likely due to recrudescence of her right pontine infarct in the setting of sedation, intubation and encephalopathy. Will need continue to monitor and re-evaluate once pt more awake and more stabilized.   Continue aspirin and Plavix, off heparin.  Continue statin.  BP goal 05/12/1949 within 24 hours of IR, afterwards less than 180.  PT/OT recommended CIR. encourage working hard with therapy.  For detailed assessment and plan, please refer to above/below as I have made changes wherever appropriate.   Rosalin Hawking, MD PhD Stroke Neurology 11/02/2021 7:10 PM  This patient is critically ill due to pontine infarct with basilar artery high-grade stenosis, status post stent, respiratory failure and at significant risk of neurological worsening, death form recurrent stroke, hemorrhagic transformation, respite failure,  heart failure, stent reocclusion. This patient's care requires constant monitoring of vital signs, hemodynamics, respiratory and cardiac monitoring, review of multiple databases, neurological assessment, discussion with family, other specialists and medical decision making of high complexity. I spent 40 minutes of neurocritical care time in the care of this patient. I had long discussion with son at bedside, updated pt current condition, treatment plan and potential prognosis, and answered all the questions.  He expressed understanding and appreciation.       To contact Stroke Continuity provider, please refer to [email or web address] After hours, contact General Neurology

## 2021-11-02 NOTE — Anesthesia Postprocedure Evaluation (Signed)
Anesthesia Post Note  Patient: Cassandra Dodson  Procedure(s) Performed: ANGIOPLASTY/STENT     Patient location during evaluation: SICU Anesthesia Type: General Level of consciousness: sedated and patient remains intubated per anesthesia plan Pain management: pain level controlled Vital Signs Assessment: post-procedure vital signs reviewed and stable Respiratory status: patient remains intubated per anesthesia plan and patient on ventilator - see flowsheet for VS Cardiovascular status: stable Anesthetic complications: no   No notable events documented.  Last Vitals:  Vitals:   11/02/21 0733 11/02/21 0800  BP: 135/63   Pulse: 73   Resp: 17   Temp:  36.5 C  SpO2: 100%     Last Pain:  Vitals:   11/02/21 0800  TempSrc: Axillary  PainSc:                  Nolon Nations

## 2021-11-03 ENCOUNTER — Encounter (HOSPITAL_COMMUNITY): Payer: Self-pay | Admitting: Interventional Radiology

## 2021-11-03 DIAGNOSIS — I651 Occlusion and stenosis of basilar artery: Secondary | ICD-10-CM | POA: Diagnosis not present

## 2021-11-03 DIAGNOSIS — N390 Urinary tract infection, site not specified: Secondary | ICD-10-CM | POA: Diagnosis not present

## 2021-11-03 DIAGNOSIS — G9389 Other specified disorders of brain: Secondary | ICD-10-CM | POA: Diagnosis not present

## 2021-11-03 DIAGNOSIS — I6322 Cerebral infarction due to unspecified occlusion or stenosis of basilar arteries: Secondary | ICD-10-CM | POA: Diagnosis not present

## 2021-11-03 LAB — GLUCOSE, CAPILLARY
Glucose-Capillary: 86 mg/dL (ref 70–99)
Glucose-Capillary: 62 mg/dL — ABNORMAL LOW (ref 70–99)
Glucose-Capillary: 107 mg/dL — ABNORMAL HIGH (ref 70–99)
Glucose-Capillary: 83 mg/dL (ref 70–99)
Glucose-Capillary: 110 mg/dL — ABNORMAL HIGH (ref 70–99)
Glucose-Capillary: 189 mg/dL — ABNORMAL HIGH (ref 70–99)

## 2021-11-03 MED ORDER — FENOFIBRATE 160 MG PO TABS
160.0000 mg | ORAL_TABLET | Freq: Every day | ORAL | Status: DC
  Administered 2021-11-03 – 2021-11-05 (×3): 160 mg via ORAL
  Filled 2021-11-03 (×3): qty 1

## 2021-11-03 MED ORDER — CALCIUM CARBONATE 1250 (500 CA) MG PO TABS
1250.0000 mg | ORAL_TABLET | Freq: Every day | ORAL | Status: DC
  Administered 2021-11-03 – 2021-11-05 (×3): 1250 mg via ORAL
  Filled 2021-11-03 (×4): qty 1

## 2021-11-03 MED ORDER — PANTOPRAZOLE SODIUM 40 MG PO TBEC
40.0000 mg | DELAYED_RELEASE_TABLET | Freq: Every day | ORAL | Status: DC
  Administered 2021-11-03 – 2021-11-05 (×3): 40 mg via ORAL
  Filled 2021-11-03 (×3): qty 1

## 2021-11-03 MED ORDER — AMLODIPINE BESYLATE 10 MG PO TABS
10.0000 mg | ORAL_TABLET | Freq: Every day | ORAL | Status: DC
  Administered 2021-11-03 – 2021-11-05 (×3): 10 mg via ORAL
  Filled 2021-11-03 (×3): qty 1

## 2021-11-03 MED ORDER — DOCUSATE SODIUM 100 MG PO CAPS
100.0000 mg | ORAL_CAPSULE | Freq: Two times a day (BID) | ORAL | Status: DC
  Administered 2021-11-03 – 2021-11-05 (×5): 100 mg via ORAL
  Filled 2021-11-03 (×5): qty 1

## 2021-11-03 MED ORDER — INSULIN ASPART 100 UNIT/ML IJ SOLN: 0.0000 [IU] | Freq: Every day | INTRAMUSCULAR | Status: DC

## 2021-11-03 MED ORDER — FOLIC ACID 1 MG PO TABS
1.0000 mg | ORAL_TABLET | Freq: Every day | ORAL | Status: DC
  Administered 2021-11-03 – 2021-11-05 (×3): 1 mg via ORAL
  Filled 2021-11-03 (×3): qty 1

## 2021-11-03 MED ORDER — INSULIN GLARGINE-YFGN 100 UNIT/ML ~~LOC~~ SOLN
32.0000 [IU] | Freq: Every day | SUBCUTANEOUS | Status: DC
  Administered 2021-11-04 – 2021-11-05 (×2): 32 [IU] via SUBCUTANEOUS
  Filled 2021-11-03 (×4): qty 0.32

## 2021-11-03 MED ORDER — INSULIN ASPART 100 UNIT/ML IJ SOLN
0.0000 [IU] | Freq: Three times a day (TID) | INTRAMUSCULAR | Status: DC
  Administered 2021-11-04: 5 [IU] via SUBCUTANEOUS
  Administered 2021-11-04 (×2): 3 [IU] via SUBCUTANEOUS
  Administered 2021-11-05: 5 [IU] via SUBCUTANEOUS
  Administered 2021-11-05: 3 [IU] via SUBCUTANEOUS

## 2021-11-03 MED ORDER — IRBESARTAN 75 MG PO TABS
75.0000 mg | ORAL_TABLET | Freq: Every day | ORAL | Status: DC
  Administered 2021-11-03 – 2021-11-05 (×3): 75 mg via ORAL
  Filled 2021-11-03 (×3): qty 1

## 2021-11-03 MED ORDER — POLYETHYLENE GLYCOL 3350 17 G PO PACK
17.0000 g | PACK | Freq: Every day | ORAL | Status: DC
  Administered 2021-11-03 – 2021-11-05 (×3): 17 g via ORAL
  Filled 2021-11-03 (×3): qty 1

## 2021-11-03 MED ORDER — ROSUVASTATIN CALCIUM 20 MG PO TABS
20.0000 mg | ORAL_TABLET | Freq: Every day | ORAL | Status: DC
  Administered 2021-11-03 – 2021-11-05 (×3): 20 mg via ORAL
  Filled 2021-11-03 (×3): qty 1

## 2021-11-03 MED ORDER — ALPRAZOLAM 0.25 MG PO TABS: 0.2500 mg | ORAL_TABLET | Freq: Every evening | ORAL | Status: DC | PRN

## 2021-11-03 MED ORDER — "THROMBI-PAD 3""X3"" EX PADS"
1.0000 | MEDICATED_PAD | CUTANEOUS | Status: AC
  Administered 2021-11-03: 1 via TOPICAL
  Filled 2021-11-03: qty 1

## 2021-11-03 NOTE — Progress Notes (Signed)
Referring Physician(s): Rosalin Hawking, MD  Supervising Physician: Luanne Bras  Patient Status:  [facility] - In-pt  Chief Complaint: Follow up basilar artery stenosis s/p stent assisted angioplasty 11/01/21 in NIR  Subjective:  Patient extubated, sitting up in bed sleeping. She arouses to loud verbal cues but refuses to open her eyes stating she is tired and wants me to leave her alone. When asked to participate in neuro exam she states "I did that already!" She does allow me to assess her right radial access site.  Allergies: Anesthetics, amide and Ether  Medications: Prior to Admission medications   Medication Sig Start Date End Date Taking? Authorizing Provider  ALPRAZolam Duanne Moron) 0.5 MG tablet Take 0.25-0.5 mg by mouth at bedtime as needed for sleep. 09/21/21  Yes [provider]  aspirin EC 81 MG tablet Take 81 mg by mouth daily. Swallow whole.   Yes [provider]  calcium carbonate (OSCAL) 1500 (600 Ca) MG TABS tablet Take 1,500 mg by mouth daily with breakfast.   Yes [provider]  fenofibrate 160 MG tablet Take 160 mg by mouth daily after breakfast.  05/12/16  Yes [provider]  HUMALOG MIX 75/25 KWIKPEN (75-25) 100 UNIT/ML Kwikpen Inject 30 Units into the skin in the morning and at bedtime. 06/08/16  Yes [provider]  metFORMIN (GLUCOPHAGE) 1000 MG tablet Take 1,000 mg by mouth 2 (two) times daily.  01/21/15  Yes [provider]  telmisartan (MICARDIS) 80 MG tablet Take 80 mg by mouth daily after breakfast.  06/01/19  Yes [provider]     Vital Signs: BP (!) 166/87 (BP Location: Right Leg)   Pulse 93   Temp 98.2 F (36.8 C) (Axillary)   Resp 15   Ht '5\' 5"'$  (1.651 m)   Wt 169 lb 1.5 oz (76.7 kg)   SpO2 97%   BMI 28.14 kg/m   Physical Exam Vitals and nursing note reviewed.  Constitutional:      Comments: Sleeping but arouses to verbal cues, refuses to open eyes or participate in neuro exam  HENT:      Head: Normocephalic.  Cardiovascular:     Rate and Rhythm: Normal rate.  Pulmonary:     Effort: Pulmonary effort is normal.  Skin:    General: Skin is warm and dry.     Comments: (+) right radial puncture site clean, dry, dressed without active bleeding or drainage. There is a 10-12 cm in diameter hematoma just proximal to the puncture site, it is firm and mildly tender to deep palpation. No open wounds or areas of necrosis, able to move hand/fingers easily, radial pulse palpable, hand appears well perfused.     Imaging: DG CHEST PORT 1 VIEW  Result Date: 11/01/2021 CLINICAL DATA:  Check insertion of support tubes. Ventilator dependent respiratory failure. EXAM: PORTABLE CHEST 1 VIEW COMPARISON:  Portable chest earlier today at 6:35 p.m., and abdomen and pelvis CT no contrast 03/30/2019. FINDINGS: Chest: 8:19 p.m. interval ETT pullback to 3.4 cm from the carina. Interval NGT insertion which is better demonstrated on the abdomen film. Right IJ central line tip again is seen in the SVC at the azygous confluence. There is no pneumothorax. The lungs are generally clear. There is aortic tortuosity and atherosclerosis mild aortic ectasia, small hiatal hernia. Heart size and vascular pattern are normal. No pleural effusion is seen. Osteopenia and thoracic spondylosis, degenerative change both shoulders. Abdomen: NGT is well below the diaphragm. The tip superimposes to the right  of L5 and is probably in the distal gastric antrum, although the course of the tube is more vertical than expected based on the positioning of the stomach on the prior CT. There is no supine evidence of free air. The bowel pattern is nonobstructive with moderate fecal stasis. The visceral shadows are stable with no visible pathologic calcification although with most of the pelvic area excluded from the exam. Contrast in the kidneys is noted with excretion into the collecting systems. No hydronephrosis. There is osteopenia,  degenerative changes and slight levoscoliosis of the lumbar spine. IMPRESSION: 1. Interval ETT pullback to 3.4 cm from the carina. 2. NGT tip superimposed to the right of L5 and more vertical in orientation than expected based on the position of the stomach on the prior CT, probably in the distal gastric antrum. Did the patient have a gastric bypass since the prior CT? 3. No evidence of acute chest disease. 4. Constipation. Electronically Signed   By: Telford Nab M.D.   On: 11/01/2021 21:04   DG Abd Portable 1V  Result Date: 11/01/2021 CLINICAL DATA:  Check insertion of support tubes. Ventilator dependent respiratory failure. EXAM: PORTABLE CHEST 1 VIEW COMPARISON:  Portable chest earlier today at 6:35 p.m., and abdomen and pelvis CT no contrast 03/30/2019. FINDINGS: Chest: 8:19 p.m. interval ETT pullback to 3.4 cm from the carina. Interval NGT insertion which is better demonstrated on the abdomen film. Right IJ central line tip again is seen in the SVC at the azygous confluence. There is no pneumothorax. The lungs are generally clear. There is aortic tortuosity and atherosclerosis mild aortic ectasia, small hiatal hernia. Heart size and vascular pattern are normal. No pleural effusion is seen. Osteopenia and thoracic spondylosis, degenerative change both shoulders. Abdomen: NGT is well below the diaphragm. The tip superimposes to the right of L5 and is probably in the distal gastric antrum, although the course of the tube is more vertical than expected based on the positioning of the stomach on the prior CT. There is no supine evidence of free air. The bowel pattern is nonobstructive with moderate fecal stasis. The visceral shadows are stable with no visible pathologic calcification although with most of the pelvic area excluded from the exam. Contrast in the kidneys is noted with excretion into the collecting systems. No hydronephrosis. There is osteopenia, degenerative changes and slight levoscoliosis of  the lumbar spine. IMPRESSION: 1. Interval ETT pullback to 3.4 cm from the carina. 2. NGT tip superimposed to the right of L5 and more vertical in orientation than expected based on the position of the stomach on the prior CT, probably in the distal gastric antrum. Did the patient have a gastric bypass since the prior CT? 3. No evidence of acute chest disease. 4. Constipation. Electronically Signed   By: Telford Nab M.D.   On: 11/01/2021 21:04   Portable Chest x-ray  Result Date: 11/01/2021 CLINICAL DATA:  ET tube EXAM: PORTABLE CHEST 1 VIEW COMPARISON:  Chest x-ray 06/11/2019 FINDINGS: Endotracheal tube tip is 7 mm above the carina. Right-sided central venous catheter tip projects over the SVC. The lungs are clear. There is no pleural effusion or pneumothorax. The cardiomediastinal silhouette is within normal limits. No acute fractures are seen. IMPRESSION: 1. Endotracheal tube tip is 7 mm above the carina. Consider repositioning. 2. Right-sided central venous catheter tip projects over the SVC. 3. The lungs are clear. Electronically Signed   By: Ronney Asters M.D.   On: 11/01/2021 18:46    Labs:  CBC: Recent Labs    10/31/21 0108 11/01/21 0309 11/01/21 1818 11/01/21 1845 11/02/21 0528  WBC 10.5 9.9 8.7  --  10.9*  HGB 13.9 13.6 13.1 12.9 13.1  HCT 40.5 39.7 39.4 38.0 40.0  PLT 137* 148* 136*  --  178    COAGS: Recent Labs    10/25/21 1540 10/30/21 0449 11/01/21 1127  INR 1.1 1.2 1.2  APTT 25  --   --     BMP: Recent Labs    10/30/21 0449 10/31/21 0108 11/01/21 0309 11/01/21 1845 11/02/21 0528  NA 138 135 134* 139 138  K 4.5 4.7 4.9 4.5 4.3  CL 102 99 99  --  103  CO2 '26 26 25  '$ --  22  GLUCOSE 221* 277* 265*  --  175*  BUN 45* 48* 55*  --  45*  CALCIUM 9.1 9.0 9.1  --  8.2*  CREATININE 1.22* 1.43* 1.69*  --  1.09*  GFRNONAA 44* 37* 30*  --  51*    LIVER FUNCTION TESTS: Recent Labs    10/25/21 1540 10/26/21 0225 11/01/21 0309 11/02/21 0528  BILITOT 0.5 0.8  0.4 0.3  AST 12* 15 12* 13*  ALT '11 14 10 10  '$ ALKPHOS 51 51 59 49  PROT 6.9 6.7 5.8* 5.6*  ALBUMIN 4.1 3.6 2.8* 2.7*    Assessment and Plan:  81 y/o F with history of basilar artery stenosis s/p stent assisted angioplasty 11/01/21 with Dr. Estanislado Pandy seen today for follow up.  Patient now extubated, sleeping on my exam today and refuses to participate in neuro exam or open eyes because she states she is too tired. Able to evaluate right arm hematoma just proximal to procedure puncture site - it is about 10-12 cm in diameter, firm and mildly tender with palpation. There are no open wounds. She appears to be neurovascularly in tact on that hand/arm although exam is somewhat limited by her participation. Hand appears well perfused and is warm.  IR will continue to follow post procedure hematoma, recommend heat pack Q2H while awake for 20 minutes at a time if patient will allow as it has been > 48 hours at this point. If enlarging will consider further imaging/vascular surgery consultation.  Please call with any questions or concerns.  Electronically Signed: Joaquim Nam, PA-C 11/03/2021, 9:46 AM   I spent a total of 15 Minutes at the the patient's bedside AND on the patient's hospital floor or unit, greater than 50% of which was counseling/coordinating care for basilar artery stenosis.

## 2021-11-03 NOTE — Progress Notes (Addendum)
STROKE TEAM PROGRESS NOTE   INTERVAL HISTORY Patient is awake and alert sitting up in bed. RN at bedside. Cleviprex is off. BP better controlled today. She is oriented to self, place. No aphasia, mild dysarthria. Left facial droop. Still with Right INO, nystagmus on right gaze. Right upper 4/5, Left side weakness. left arm 3/5, Left leg minimal movement. Transfer out of the ICU today  Vital signs stable.  Neurological exam unchanged. Vitals:   11/03/21 0300 11/03/21 0400 11/03/21 0600 11/03/21 0700  BP: (!) 177/87 (!) 151/63 (!) 147/69 (!) 163/95  Pulse: 90 84 70 90  Resp: '16 15 13 15  '$ Temp:  98.5 F (36.9 C)    TempSrc:  Axillary    SpO2: 97% 95% 97% 95%  Weight:      Height:       CBC:  Recent Labs  Lab 11/01/21 1818 11/01/21 1845 11/02/21 0528  WBC 8.7  --  10.9*  NEUTROABS 7.2  --  8.9*  HGB 13.1 12.9 13.1  HCT 39.4 38.0 40.0  MCV 92.5  --  92.8  PLT 136*  --  893    Basic Metabolic Panel:  Recent Labs  Lab 11/01/21 0309 11/01/21 1845 11/02/21 0528  NA 134* 139 138  K 4.9 4.5 4.3  CL 99  --  103  CO2 25  --  22  GLUCOSE 265*  --  175*  BUN 55*  --  45*  CREATININE 1.69*  --  1.09*  CALCIUM 9.1  --  8.2*  MG 2.2  --  2.0  PHOS 3.6  --  4.2    Lipid Panel:  Recent Labs  Lab 11/02/21 0528  TRIG 147    HgbA1c:  No results for input(s): "HGBA1C" in the last 168 hours.  Urine Drug Screen: No results for input(s): "LABOPIA", "COCAINSCRNUR", "LABBENZ", "AMPHETMU", "THCU", "LABBARB" in the last 168 hours.  Alcohol Level  No results for input(s): "ETH" in the last 168 hours.   IMAGING past 24 hours No results found.  PHYSICAL EXAM General:  Alert, well-nourished, well-developed pleasant elderly Caucasian lady t in no acute distress Respiratory:  Regular, unlabored respirations  NEURO:  She is oriented to self, place. No aphasia, mild dysarthria. Left facial droop. Follows commands, can name objects. Still with Right INO, nystagmus on right gaze. Right  upper 4/5, Left side weakness. left arm 3/5, Left leg minimal movement.    ASSESSMENT/PLAN Cassandra Dodson is a 81 y.o. female with history of  HTN, DM, CKD3, GERD and endometrial cancer  presenting with slurred speech for two days and left leg weakness for one day.  Patient's son states that she first seemed to be having problems on 7/4 when she had some difficulty with ambulation, then developed slurred speech and dragging of the left leg while walking.  [place] shows a left paracentral meningioma with vasogenic edema, and MRI brain shows a right paramedian pontine stroke. Patient denies any prior known history of meningioma or brain problems.  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  Therapy recommendations:  CIR Disposition:  pending  Basilar artery stenosis CTA head & neck marked stenosis of BA proximal to SCAs Per son, pt baseline walks with walker due to left hip and b/l knee replacement but lives independently PTA Now with mild left facial droop and left UE drift post stroke Given high risk of recurrent stroke with devastating outcome, basilar artery stent assisted angioplasty on 7/13. Off heparin IV, on DAPT  Left frontal large meningioma CT head 5.0 cm intracranial mass along anterior falx, likely to be a  meningioma with vasogenic edema and 2 cm localized midline shift MRI left anterior cranial fossa meningioma with vasogenic edema and MLS Neurosurgery on board Plan for possible surgical resection in 8-12 weeks first On decardron  Hypertension Home meds:  telmisartan 80 mg daily Goal 120-150. After 24 hr of IR BP goal < 180 Cleviprex drip off Avapro restarted '@75mg'$  Norvasc '10mg'$   Long-term BP goal normotensive  Hyperlipidemia Home meds:  fenofibrate 160 mg daily, resumed in hospital LDL 133, goal < 70 Continue rosuvastatin 20 mg daily  Continue statin at discharge  Diabetes type II Uncontrolled Home meds:  metformin 1000 mg BID HgbA1c 7.0, goal < 7.0 CBGs SSI On insulin now Hyperglycemia especially on decardron now, improved Close PCP follow up for better DM control in the setting of steroid use  Dysphagia Speech on board Now on nectar thick liquid Aspiration precaution  AKI on CKD 3a Cre 1.3->1.43->1.22->1.43->1.69->1.09 Avoid dehydration  Other Stroke Risk Factors Advanced Age >/= 49  Former cigarette smoker  Other Active Problems Hx of left hip and b/l Knee replacement   Hospital day # 8  [name] DNP, ACNPC-AG  I have personally obtained history,examined this patient, reviewed notes, independently viewed imaging studies, participated in medical decision making and plan of care.ROS completed by me personally and pertinent positives fully documented  I have made any additions or clarifications directly to the above note. Agree with note above.  Patient is doing well but still has persistent intermittent ophthalmoplegia and left hemiparesis.  Speech therapy for swallow eval today.  Mobilize out of bed.  Continue ongoing therapies.  Transfer to neurology floor bed she will likely need inpatient rehab.  Continue aspirin and Plavix for 3 to 6 months due to recent stent.  Aggressive risk factor modification.  Greater than 50% time during this 50-minute visit was spent  in counseling and coordination of care about his stroke and intracranial stent and answering questions.  Discussed with Dr. Tacy Learn critical care medicine.  Antony Contras, MD Medical Director [facility] Pager: [phone] 11/03/2021 3:29 PM   To contact Stroke Continuity provider, please refer to [email or web address] After hours, contact General Neurology

## 2021-11-03 NOTE — Evaluation (Signed)
Clinical/Bedside Swallow Evaluation Patient Details  Name: Cassandra Dodson MRN: [id number] Date of Birth: 08-26-1940  Today's Date: 11/03/2021 Time: SLP Start Time (ACUTE ONLY): 60 SLP Stop Time (ACUTE ONLY): 1246 SLP Time Calculation (min) (ACUTE ONLY): 20 min  Past Medical History:  Past Medical History:  Diagnosis Date   Arthritis    CKD (chronic kidney disease), stage III ([place])    patient denies   Depression    Diabetes mellitus without complication ([place])    Difficult intravenous access    Endometrial cancer ([place])    GERD (gastroesophageal reflux disease)    Headache    History of radiation therapy 11/04/2019-12/01/2019   Endometrial HDR; Dr. Gery Pray   Hypertension    Hypothyroidism    Neuromuscular disorder ([place])    neuropathy in feet   Osteoarthritis    PMB (postmenopausal bleeding)    PONV (postoperative nausea and vomiting)    severe nausea and vomiting after knee replacement 06-2014, did ok with 2018 knee replacement   Urinary frequency    Wears glasses    Past Surgical History:  Past Surgical History:  Procedure Laterality Date   BREAST SURGERY     cyst removed   CESAREAN SECTION     DILATION AND CURETTAGE OF UTERUS N/A 06/24/2019   Procedure: DILATATION AND CURETTAGE;  Surgeon: Lafonda Mosses, MD;  Location: [place] [facility] [place];  Service: Gynecology;  Laterality: N/A;   FRACTURE SURGERY     right knee   INTRAUTERINE DEVICE (IUD) INSERTION N/A 06/24/2019   Procedure: INTRAUTERINE DEVICE (IUD) INSERTION MIRENA;  Surgeon: Lafonda Mosses, MD;  Location[facility];  Service: Gynecology;  Laterality: N/A;   IRRIGATION AND DEBRIDEMENT SEBACEOUS CYST     JOINT REPLACEMENT Right    right   LYMPH NODE DISSECTION N/A 09/14/2019   Procedure: LYMPH NODE DISSECTION;  Surgeon: Lafonda Mosses, MD;  Location: [facility];  Service: Gynecology;  Laterality: N/A;   ROBOTIC ASSISTED TOTAL HYSTERECTOMY WITH BILATERAL SALPINGO OOPHERECTOMY  Bilateral 09/14/2019   Procedure: XI ROBOTIC ASSISTED TOTAL HYSTERECTOMY WITH BILATERAL SALPINGO OOPHORECTOMY;  Surgeon: Lafonda Mosses, MD;  Location: [facility];  Service: Gynecology;  Laterality: Bilateral;   SENTINEL NODE BIOPSY N/A 09/14/2019   Procedure: SENTINEL NODE BIOPSY;  Surgeon: Lafonda Mosses, MD;  Location: [facility];  Service: Gynecology;  Laterality: N/A;   teeth extration     TONSILLECTOMY     TOTAL HIP ARTHROPLASTY Right 02/23/2015   Procedure: RIGHT TOTAL HIP ARTHROPLASTY ANTERIOR APPROACH;  Surgeon: Leandrew Koyanagi, MD;  Location: [place];  Service: Orthopedics;  Laterality: Right;   TOTAL KNEE ARTHROPLASTY Left 06/27/2016   Procedure: LEFT TOTAL KNEE ARTHROPLASTY;  Surgeon: Leandrew Koyanagi, MD;  Location: [place];  Service: Orthopedics;  Laterality: Left;   HPI:  Pt is an 81 y/o female admitted secondary to slurred speech and L sided weakness. 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 FEES 7/8 revealed a mild-moderate oropharyngeal dysphagia. Oral phase appeared to be mostly impacted by cognition. pharyngeally she had delayed initiation that resulted in penetration of thin (very close to the vocal folds) and nectar thick liquids (which cleared spontaneously). There was some concern for aspiration of thin liquids on delayed coughing as well. Dys 3 diet and nectar  thick liquids recommended. Pt went to IR for stent 7/13 and remained intubated post-op; extubated 7/14, after which she passed a [place] swallow screen. SLP re-ordered for swallowing 7/15 when observed to be coughing with thin liquids.  PMH including but not limited to HTN, DM, CKD3, GERD and endometrial cancer.    Assessment / Plan / Recommendation  Clinical Impression  Pt presents with signs of  dysphagia that appear to be generally consistent with documentation of swallowing from before recent intubation. These symptoms include intermittent wet vocal quality and delayed coughing. Although difficult to differentiate which consistency may be resulting in this coughing during this eval due to the delayed nature, nursing staff and pt's son have reported coughing specifically with thin liquids. This is also what was frankly penetrated and likely aspirated on MBS. Intubation was brief and vocal quality is not hoarse, so swallowing function is likely to be near baseline from recent FEES. Recommend adjusting diet to Dys 3 solids with nectar thick liquids. Will continue to follow acutely. SLP Visit Diagnosis: Dysphagia, oropharyngeal phase (R13.12)    Aspiration Risk  Mild aspiration risk;Moderate aspiration risk    Diet Recommendation Dysphagia 3 (Mech soft);Nectar-thick liquid   Liquid Administration via: Cup;Straw Medication Administration: Whole meds with puree Supervision: Staff to assist with self feeding Compensations: Slow rate;Small sips/bites Postural Changes: Seated upright at 90 degrees    Other  Recommendations Oral Care Recommendations: Oral care BID    Recommendations for follow up therapy are one component of a multi-disciplinary discharge planning process, led by the attending physician.  Recommendations may be updated based on patient status, additional functional criteria and insurance authorization.  Follow up Recommendations Acute inpatient rehab (3hours/day)      Assistance Recommended at Discharge Frequent or constant Supervision/Assistance  Functional Status Assessment Patient has had a recent decline in their functional status and demonstrates the ability to make significant improvements in function in a reasonable and predictable amount of time.  Frequency and Duration min 2x/week  2 weeks       Prognosis Prognosis for Safe Diet Advancement: Good Barriers to  Reach Goals: Cognitive deficits      Swallow Study   General HPI: Pt is an 81 y/o female admitted secondary to slurred speech and L sided weakness. 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 FEES 7/8 revealed a mild-moderate oropharyngeal dysphagia. Oral phase appeared to be mostly impacted by cognition. pharyngeally she had delayed initiation that resulted in penetration of thin (very close to the vocal folds) and nectar thick liquids (which cleared spontaneously). There was some concern for aspiration of thin liquids on delayed coughing as well. Dys 3 diet and nectar thick liquids recommended. Pt went to IR for stent 7/13 and remained intubated post-op; extubated 7/14, after which she passed a [place] swallow screen. SLP re-ordered for swallowing 7/15 when observed to be coughing with thin liquids.  PMH including but not limited to HTN, DM, CKD3, GERD and endometrial cancer. Type of Study: Bedside Swallow Evaluation Previous Swallow Assessment: see HPI Diet Prior to this Study: Dysphagia 3 (soft);Thin liquids Temperature Spikes Noted: No Respiratory Status: Room air History of Recent Intubation: Yes Length of Intubations (days): 1 days Date extubated: 11/02/21 Behavior/Cognition: Alert;Cooperative;Pleasant mood;Confused Oral Cavity Assessment: Within Functional Limits Oral Care Completed by SLP:  No Oral Cavity - Dentition: Missing dentition Vision: Functional for self-feeding Self-Feeding Abilities: Needs assist Patient Positioning: Upright in bed Baseline Vocal Quality: Normal Volitional Cough: Strong Volitional Swallow: Unable to elicit    Oral/Motor/Sensory Function Overall Oral Motor/Sensory Function: Mild impairment Facial ROM: Reduced left;Suspected CN  VII (facial) dysfunction Facial Symmetry: Abnormal symmetry left;Suspected CN VII (facial) dysfunction Lingual ROM: Within Functional Limits Lingual Symmetry: Within Functional Limits Lingual Strength: Within Functional Limits Lingual Sensation: Within Functional Limits Velum: Within Functional Limits Mandible: Within Functional Limits   Ice Chips Ice chips: Not tested   Thin Liquid Thin Liquid: Impaired Presentation: Cup;Straw Pharyngeal  Phase Impairments: Wet Vocal Quality;Cough - Delayed    Nectar Thick Nectar Thick Liquid: Impaired Presentation: Cup;Straw Pharyngeal Phase Impairments: Wet Vocal Quality;Cough - Delayed   Honey Thick Honey Thick Liquid: Not tested   Puree Puree: Within functional limits Presentation: Spoon   Solid     Solid: Impaired Presentation: Self Fed Oral Phase Impairments: Impaired mastication Oral Phase Functional Implications: Prolonged oral transit;Oral residue      Osie Bond., M.A. [place] Office [phone]  Secure chat preferred  11/03/2021,1:11 PM

## 2021-11-03 NOTE — Progress Notes (Signed)
1400: While bathing pt, IJ dressing applied 7/14 at 1730 noted to be saturated with blood with slow ooze from puncture site. Site redressed with occlusive dressing.   1600: Upon reassessment, RN noted gauze to have new blood on it. Dr. Tacy Learn notified of bleeding, order given to apply quickclot bandage to site.   1745: Slight blood shadow noted on quickclot dressing. When pressure applied to site, dressing became saturated with blood. Dr. Tacy Learn notified. Order given for thrombipad application. 4N pharmacist called for stat thrombipad order.   1800: Thrombipad applied. Manual pressure started and held for 15 minutes. Transparent tegarderm dressing applied over thrombipad so bleeding can be seen.

## 2021-11-03 NOTE — Progress Notes (Signed)
Patient ID: Cassandra Dodson, female   DOB: Aug 18, 1940, 81 y.o.   MRN: [id number] Patient is awake and alert.  She is eager to be moved from the intensive care unit.  She is feeling overall well.  I am continuing to follow along clinically as she recovers from her recent stenting procedure.  I believe she needs a little bit more time to recover from her recent stroke before we consider any surgical interventions for her frontal meningioma.  I will see the patient on Monday.

## 2021-11-03 NOTE — Progress Notes (Signed)
NAME:  Cassandra Dodson, MRN:  [id number], DOB:  04/01/1941, LOS: 8 ADMISSION DATE:  10/25/2021, CONSULTATION DATE:  7/13 REFERRING MD:  Dr Estanislado Pandy, CHIEF COMPLAINT:  basilar artery stenosis   History of Present Illness:  81 year old female with PMH as below, which is significant for CKD III, DM, endometrial ca (two years post completion of adjuvant therapy), HTN, and hypothyroid. She was in her usual state of health until approximately July 4th of this year, when she developed slurred speech and then left sided weakness the following day which caused her to present to [facility] ED on 7/6. Head CT demonstrated mass likely to be meningioma with vasogenic edema in the L frontal lobe. Neurosurgery was consulted and started steroids. MRI brain showed large acute right pontine infarct secondary to stenosis of the basilar artery. There was felt to be high risk of recurrent stroke due to the stenosis, so IR was asked to evaluate. On 7/13 she was taken for basilar artery stent. The procedure was uncomplicated, however, she was noted to have some peripheral edema and for this reason the decision was made not to extubate. PCCM consulted for ICU and ventilatory management.   Pertinent  Medical History   has a past medical history of Arthritis, CKD (chronic kidney disease), stage III ([place]), Depression, Diabetes mellitus without complication ([place]), Difficult intravenous access, Endometrial cancer ([place]), GERD (gastroesophageal reflux disease), Headache, History of radiation therapy (11/04/2019-12/01/2019), Hypertension, Hypothyroidism, Neuromuscular disorder ([place]), Osteoarthritis, PMB (postmenopausal bleeding), PONV (postoperative nausea and vomiting), Urinary frequency, and Wears glasses.   Significant Hospital Events: Including procedures, antibiotic start and stop dates in addition to other pertinent events   7/6 admit for meningioma, MRI discovered pontine stroke/basilar stenosis 7/9 IR consulted. Plans for  basilar stent 7/13 to IR for stent. Post op to ICU on vent.  7/14 extubated  Interim History / Subjective:  Patient became hypoglycemic last night and this morning, required orange juice She was successfully extubated yesterday She is coughing while drinking thin liquids  Objective   Blood pressure (!) 163/95, pulse 90, temperature 98.5 F (36.9 C), temperature source Axillary, resp. rate 15, height '5\' 5"'$  (1.651 m), weight 76.7 kg, SpO2 95 %.        Intake/Output Summary (Last 24 hours) at 11/03/2021 0809 Last data filed at 11/03/2021 0600 Gross per 24 hour  Intake 346.77 ml  Output 1350 ml  Net -1003.23 ml   Filed Weights   10/25/21 1531 11/01/21 1042  Weight: 76.7 kg 76.7 kg    Examination: Physical exam: General: Acute on chronically ill-appearing female, lying on the bed HEENT: [place]/AT, eyes anicteric.  moist mucus membranes Neuro: Opens eyes with vocal stimuli, has disconjugate gaze, following simple commands, power 3/5 on left side, 5/5 on right side.  Has left facial droop Chest: Coarse breath sounds, no wheezes or rhonchi Heart: Regular rate and rhythm, no murmurs or gallops Abdomen: Soft, nontender, nondistended, bowel sounds present Skin: No rash   Resolved Hospital Problem list     Assessment & Plan:  Subacute right paramedian pontine stroke Severe stenosis of basilar artery S/p basilar artery stent placement 7/13 Continue neuro watch every hour Continue secondary stroke prophylaxis, currently on aspirin and Plavix Continue statin Clevidipine was turned off Continue amlodipine  Acute postop respiratory insufficiency, resolved Patient was successfully extubated yesterday Currently on room air Encourage incentive spirometry  Left frontal meningioma with cerebral edema and brain compression: MRI left anterior cranial fossa meningioma with vasogenic edema and midline shift  Followed by neurosurgery No plan for resection of tumor on this visit, possible  resection in 8-12 weeks.  Continue decadron.   AKI on CKD stage III a Serum creatinine improved, now at baseline Avoid nephrotoxic agents Monitor intake and output  HTN HLD With known irbesartan, continue amlodipine Continue fenofibrate, crestor  DM2 Hemoglobin A1c 7 Last night she went into hypoglycemia and this morning also her fingerstick dropped to 70s Closely monitor fingerstick with goal 140-180 Decrease Semglee to 32 units units  Continue to hold metformin Continue sliding scale insulin  Dysphagia Patient is having cough with thin liquids Continue nectar thick Aspiration precautions  Best Practice (right click and "Reselect all SmartList Selections" daily)   Diet/type: Dysphagia diet DVT prophylaxis: Subcu Lovenox GI prophylaxis: PPI Lines: N/A Foley: N/A Code Status:  full code Last date of multidisciplinary goals of care discussion [7/14: With patient's son at bedside, decision was to continue full scope of care while keeping her full code]  Labs   CBC: Recent Labs  Lab 10/28/21 0230 10/29/21 0342 10/30/21 0449 10/31/21 0108 11/01/21 0309 11/01/21 1818 11/01/21 1845 11/02/21 0528  WBC 9.0   < > 9.3 10.5 9.9 8.7  --  10.9*  NEUTROABS 7.3  --   --  8.5* 8.2* 7.2  --  8.9*  HGB 15.4*   < > 14.2 13.9 13.6 13.1 12.9 13.1  HCT 45.0   < > 42.0 40.5 39.7 39.4 38.0 40.0  MCV 88.6   < > 89.9 90.0 89.2 92.5  --  92.8  PLT 159   < > 156 137* 148* 136*  --  178   < > = values in this interval not displayed.    Basic Metabolic Panel: Recent Labs  Lab 10/29/21 0342 10/30/21 0449 10/31/21 0108 11/01/21 0309 11/01/21 1845 11/02/21 0528  NA 138 138 135 134* 139 138  K 4.7 4.5 4.7 4.9 4.5 4.3  CL 100 102 99 99  --  103  CO2 '24 26 26 25  '$ --  22  GLUCOSE 228* 221* 277* 265*  --  175*  BUN 44* 45* 48* 55*  --  45*  CREATININE 1.43* 1.22* 1.43* 1.69*  --  1.09*  CALCIUM 9.6 9.1 9.0 9.1  --  8.2*  MG  --   --  2.3 2.2  --  2.0  PHOS  --   --   --  3.6  --   4.2   GFR: Estimated Creatinine Clearance: 40.8 mL/min (A) (by C-G formula based on SCr of 1.09 mg/dL (H)). Recent Labs  Lab 10/31/21 0108 11/01/21 0309 11/01/21 1818 11/02/21 0528  WBC 10.5 9.9 8.7 10.9*    Liver Function Tests: Recent Labs  Lab 11/01/21 0309 11/02/21 0528  AST 12* 13*  ALT 10 10  ALKPHOS 59 49  BILITOT 0.4 0.3  PROT 5.8* 5.6*  ALBUMIN 2.8* 2.7*   No results for input(s): "LIPASE", "AMYLASE" in the last 168 hours. No results for input(s): "AMMONIA" in the last 168 hours.  ABG    Component Value Date/Time   PHART 7.372 11/01/2021 1845   PCO2ART 42.4 11/01/2021 1845   PO2ART 141 (H) 11/01/2021 1845   HCO3 24.7 11/01/2021 1845   TCO2 26 11/01/2021 1845   ACIDBASEDEF 1.0 11/01/2021 1845   O2SAT 99 11/01/2021 1845     Coagulation Profile: Recent Labs  Lab 10/30/21 0449 11/01/21 1127  INR 1.2 1.2    Cardiac Enzymes: No results for input(s): "CKTOTAL", "CKMB", "CKMBINDEX", "TROPONINI" in  the last 168 hours.  HbA1C: Hemoglobin A1C  Date/Time Value Ref Range Status  01/23/2015 12:00 AM 7.6  Final   Hgb A1c MFr Bld  Date/Time Value Ref Range Status  10/26/2021 02:25 AM 7.0 (H) 4.8 - 5.6 % Final    Comment:    (NOTE) Pre diabetes:          5.7%-6.4%  Diabetes:              >6.4%  Glycemic control for   <7.0% adults with diabetes   09/03/2019 03:27 PM 7.9 (H) 4.8 - 5.6 % Final    Comment:    (NOTE) Pre diabetes:          5.7%-6.4% Diabetes:              >6.4% Glycemic control for   <7.0% adults with diabetes     CBG: Recent Labs  Lab 11/02/21 2024 11/02/21 2310 11/03/21 0251 11/03/21 0717 11/03/21 0759  GLUCAP 71 78 86 62* 107*    Critical care time:       Jacky Kindle, MD [facility] See Amion for pager If no response to pager, please call [phone] until 7pm After 7pm, Please call E-link [phone]

## 2021-11-04 DIAGNOSIS — G9389 Other specified disorders of brain: Secondary | ICD-10-CM | POA: Diagnosis not present

## 2021-11-04 DIAGNOSIS — I6322 Cerebral infarction due to unspecified occlusion or stenosis of basilar arteries: Secondary | ICD-10-CM | POA: Diagnosis not present

## 2021-11-04 DIAGNOSIS — R900 Intracranial space-occupying lesion found on diagnostic imaging of central nervous system: Secondary | ICD-10-CM | POA: Diagnosis not present

## 2021-11-04 DIAGNOSIS — I651 Occlusion and stenosis of basilar artery: Secondary | ICD-10-CM | POA: Diagnosis not present

## 2021-11-04 LAB — CBC WITH DIFFERENTIAL/PLATELET
Abs Immature Granulocytes: 0.06 10*3/uL (ref 0.00–0.07)
Basophils Absolute: 0 10*3/uL (ref 0.0–0.1)
Basophils Relative: 0 %
Eosinophils Absolute: 0 10*3/uL (ref 0.0–0.5)
Eosinophils Relative: 0 %
HCT: 39.1 % (ref 36.0–46.0)
Hemoglobin: 13.2 g/dL (ref 12.0–15.0)
Immature Granulocytes: 1 %
Lymphocytes Relative: 12 %
Lymphs Abs: 0.9 10*3/uL (ref 0.7–4.0)
MCH: 30.6 pg (ref 26.0–34.0)
MCHC: 33.8 g/dL (ref 30.0–36.0)
MCV: 90.7 fL (ref 80.0–100.0)
Monocytes Absolute: 0.6 10*3/uL (ref 0.1–1.0)
Monocytes Relative: 8 %
Neutro Abs: 5.9 10*3/uL (ref 1.7–7.7)
Neutrophils Relative %: 79 %
Platelets: 165 10*3/uL (ref 150–400)
RBC: 4.31 MIL/uL (ref 3.87–5.11)
RDW: 14.7 % (ref 11.5–15.5)
WBC: 7.4 10*3/uL (ref 4.0–10.5)
nRBC: 0 % (ref 0.0–0.2)

## 2021-11-04 LAB — PHOSPHORUS: Phosphorus: 3.9 mg/dL (ref 2.5–4.6)

## 2021-11-04 LAB — COMPREHENSIVE METABOLIC PANEL
ALT: 11 U/L (ref 0–44)
AST: 14 U/L — ABNORMAL LOW (ref 15–41)
Albumin: 3 g/dL — ABNORMAL LOW (ref 3.5–5.0)
Alkaline Phosphatase: 55 U/L (ref 38–126)
Anion gap: 8 (ref 5–15)
BUN: 51 mg/dL — ABNORMAL HIGH (ref 8–23)
CO2: 22 mmol/L (ref 22–32)
Calcium: 8.7 mg/dL — ABNORMAL LOW (ref 8.9–10.3)
Chloride: 102 mmol/L (ref 98–111)
Creatinine, Ser: 1.16 mg/dL — ABNORMAL HIGH (ref 0.44–1.00)
GFR, Estimated: 47 mL/min — ABNORMAL LOW (ref >60)
Glucose, Bld: 221 mg/dL — ABNORMAL HIGH (ref 70–99)
Potassium: 4.9 mmol/L (ref 3.5–5.1)
Sodium: 132 mmol/L — ABNORMAL LOW (ref 135–145)
Total Bilirubin: 0.6 mg/dL (ref 0.3–1.2)
Total Protein: 6.2 g/dL — ABNORMAL LOW (ref 6.5–8.1)

## 2021-11-04 LAB — GLUCOSE, CAPILLARY
Glucose-Capillary: 156 mg/dL — ABNORMAL HIGH (ref 70–99)
Glucose-Capillary: 138 mg/dL — ABNORMAL HIGH (ref 70–99)
Glucose-Capillary: 222 mg/dL — ABNORMAL HIGH (ref 70–99)
Glucose-Capillary: 193 mg/dL — ABNORMAL HIGH (ref 70–99)
Glucose-Capillary: 198 mg/dL — ABNORMAL HIGH (ref 70–99)

## 2021-11-04 LAB — MAGNESIUM: Magnesium: 2.3 mg/dL (ref 1.7–2.4)

## 2021-11-04 NOTE — Progress Notes (Addendum)
STROKE TEAM PROGRESS NOTE   INTERVAL HISTORY No family at the bedside. Patient is awake and alert laying in the bed in NAD. Nystagmus and saccadic dysmetria on right lateral gaze, Right INO improved. Able to cross midline. Severe dysarthria. Left droop.  Left  arm drifts, left leg drift immediately to bed, minimal movement. Neurological exam unchanged and VSS.  Vitals:   11/03/21 2300 11/03/21 2318 11/04/21 0309 11/04/21 0655  BP: (!) 159/66 127/62 (!) 191/77 (!) 175/67  Pulse: 80 82 83 64  Resp: '17 12 14 12  '$ Temp:  97.9 F (36.6 C) 97.8 F (36.6 C) 98 F (36.7 C)  TempSrc:  Oral Oral Oral  SpO2: 96% 97% 99% 97%  Weight:  84.7 kg    Height:       CBC:  Recent Labs  Lab 11/01/21 1818 11/01/21 1845 11/02/21 0528  WBC 8.7  --  10.9*  NEUTROABS 7.2  --  8.9*  HGB 13.1 12.9 13.1  HCT 39.4 38.0 40.0  MCV 92.5  --  92.8  PLT 136*  --  465    Basic Metabolic Panel:  Recent Labs  Lab 11/02/21 0528 11/04/21 0259  NA 138 132*  K 4.3 4.9  CL 103 102  CO2 22 22  GLUCOSE 175* 221*  BUN 45* 51*  CREATININE 1.09* 1.16*  CALCIUM 8.2* 8.7*  MG 2.0 2.3  PHOS 4.2 3.9    Lipid Panel:  Recent Labs  Lab 11/02/21 0528  TRIG 147    HgbA1c:  No results for input(s): "HGBA1C" in the last 168 hours.  Urine Drug Screen: No results for input(s): "LABOPIA", "COCAINSCRNUR", "LABBENZ", "AMPHETMU", "THCU", "LABBARB" in the last 168 hours.  Alcohol Level  No results for input(s): "ETH" in the last 168 hours.   IMAGING past 24 hours No results found.  PHYSICAL EXAM General:  Alert, well-nourished, well-developed pleasant elderly Caucasian lady t in no acute distress Respiratory:  Regular, unlabored respirations  NEURO:  She is oriented to self, place. No aphasia, mild dysarthria. Left facial droop. Follows commands, can name objects. Right INO improved nystagmus on right gaze. Right upper 4/5, Left side weakness. left arm 3/5, Left leg minimal movement.    ASSESSMENT/PLAN Ms.  Cassandra Dodson is a 81 y.o. female with history of  HTN, DM, CKD3, GERD and endometrial cancer  presenting with slurred speech for two days and left leg weakness for one day.  Patient's son states that she first seemed to be having problems on 7/4 when she had some difficulty with ambulation, then developed slurred speech and dragging of the left leg while walking.  Ritchie shows a left paracentral meningioma with vasogenic edema, and MRI brain shows a right paramedian pontine stroke. Patient denies any prior known history of meningioma or brain problems.  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  She will need outpatient neurology follow up after discharge in 8 weeks with [facility] Therapy recommendations:  CIR Disposition:  pending  Basilar artery stenosis CTA head & neck marked stenosis of BA proximal to SCAs Per son, pt baseline walks with walker due to left hip and b/l knee replacement but lives independently PTA Now with mild left facial droop and left UE drift post stroke Given high risk of recurrent stroke with devastating outcome, basilar artery stent assisted angioplasty on 7/13. Off heparin IV, on DAPT  Left frontal large meningioma CT head 5.0 cm intracranial mass along anterior falx, likely to be a meningioma with  vasogenic edema and 2 cm localized midline shift MRI left anterior cranial fossa meningioma with vasogenic edema and MLS Neurosurgery on board Plan for possible surgical resection in 8-12 weeks first On decardron  Hypertension Home meds:  telmisartan 80 mg daily Goal 120-150. After 24 hr of IR BP goal < 180 Cleviprex drip off Avapro '75mg'$  Norvasc '10mg'$   Long-term BP goal normotensive  Hyperlipidemia Home meds:  fenofibrate 160 mg daily, resumed in hospital LDL 133, goal < 70 Continue rosuvastatin 20 mg daily  Continue statin at discharge  Diabetes type II Uncontrolled Home meds:  metformin 1000 mg BID HgbA1c 7.0, goal < 7.0 CBGs SSI On insulin now Hyperglycemia especially on decardron now, improved Close PCP follow up for better DM control in the setting of steroid use  Dysphagia Speech on board Now on nectar thick liquid Aspiration precaution  AKI on CKD 3a Cre 1.3->1.43->1.22->1.43->1.69->1.09->1.16 Avoid dehydration  Other Stroke Risk Factors Advanced Age >/= 105  Former cigarette smoker  Other Active Problems Hx of left hip and b/l Knee replacement   Neurology will sign off. Please call with questions or concerns.   Hospital day # 9  Cassandra Gandy DNP, ACNPC-AG  I have personally obtained history,examined this patient, reviewed notes, independently viewed imaging studies, participated in medical decision making and plan of care.ROS completed by me personally and pertinent positives fully documented  I have made any additions or clarifications directly to the above note. Agree with note above.  Continue mobilization out of bed and ongoing therapy consults.  Continue dual antiplatelet therapy.  Transfer to rehab when bed available hopefully over the next few days.  No family available at the bedside for discussion today.  Greater than 50% time during this 35-minute visit were spent in counseling and coordination of care about her stroke and discussion with care team  and answered questions.  Cassandra Contras, MD Medical Director [facility] Pager: [phone] 11/04/2021 1:58 PM   To contact Stroke Continuity provider, please refer to [email or web address] After hours, contact General Neurology

## 2021-11-04 NOTE — Progress Notes (Signed)
PROGRESS NOTE    Cassandra Dodson  XNA:[id number] DOB: [birth year] DOA: 10/25/2021 PCP: Shon Baton, MD   Brief Narrative:  HPI per Dr. Irene Pap on 10/25/21  Cassandra Dodson is a 81 y.o. female with medical history significant for endometrial cancer, essential hypertension, type 2 diabetes, GERD, hypothyroidism, CKD 3B who initially presented to [facility] ED with complaints of slurred speech, left-sided weakness x2 days.  Associated with mild confusion.  No headache.  Noncontrast head CT done in the ED revealed 5.0 cm long axis intracranial mass highly likely to be a meningioma associated with 2.0 cm localized left to right midline shift with extensive vasogenic edema in the left frontal lobe.  EDP discussed the case with neurosurgery Dr. Ellene Route who recommended MRI brain with and without contrast and to add IV Decadron for the vasogenic edema.   Upon assessment at [facility], the patient is alert, follows commands, denies any headaches.  ED Course: Tmax 98.6.  BP 170/93, pulse 68, respiratory rate 29, saturation 96% on room air.  Lab studies significant for glucose 211, BUN 20, creatinine 1.09 GFR 51.  CBC essentially unremarkable except for platelet count of 146.  **Interim History   Neurology and both neurosurgery are following and neurosurgery recommending holding off any consideration of surgical intervention until she recovers from her stenting procedure and they feel that they may be just able to simply observe the meningioma.  Neurology evaluated given her acute CVA in the right paramedian pontine area with severe stenosis of the basilar artery and she is status post endovascular revascularization of the severely stenotic distal basilar artery with stent assisted angioplasty and subsequently she had to be mechanically vented and was left vented and was transferred to the intensive care unit.  Patient also developed a right arm hematoma just proximal to the procedure puncture site and a  postprocedural hematoma protocol was followed.  She was placed on PCCM service but now been transferred back to the medicine service on 11/04/2021.  Neurology continues to recommend aspirin 81 mg p.o. daily as well as clopidogrel 75 mg for least 3 to 6 months due to recent stenting and therapy recommendations for CIR.  SLP evaluated continue recommending dysphagia 3 diet with nectar thick liquids  Neurosurgery reevaluated and recommends that she will need a little bit more time to recover from her recent CVA before they consider any surgical interventions for her frontal meningioma and will follow-up again on 11/05/2021.  PT/OT recommending CIR and the rehab admissions coordinator is following for potential CIR admitted pending medical readiness after her stent procedure.   Assessment and Plan:  Intracranial mass, seen on CT likely a meningioma -Patient presented with left-sided weakness and slurred speech. -Noncontrast CT done in the ED with 5.0 cm long axis left paracentral intracranial mass along the planum sphenoidale and anterior falx, highly likely to be a meningioma associated with 2.0 cm of localized left right midline shift and with extensive vasogenic edema in the left frontal lobe.  Frontal horn of the left lateral ventricle partially effaced.  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 -MRI brain done as recommended per neurosurgery with large acute right paramedian pontine infarct, anterior cranial fossa meningioma with large amount of surrounding vasogenic edema in the anterior left frontal lobe.  Rightward midline shift with subfalcine herniation of the left cingulate gyrus. -Neurosurgery consulted who recommended IV Decadron 4 mg every 6 hours and now recommending to continue IV Decadron 4 mg every 12 hours x2 to 3 days  and then 2 mg every 12 hours x2 to 3 days then subsequently discontinue per discussion with neurosurgery, Dr. Annette Stable. -Neurosurgery also recommending conservative treatment at  this time allowing patient to recover from stroke before considering major surgical intervention to debulk and remove left frontal meningioma. -Continue GI prophylaxis with PPI.   -Was on IV fluid but is now stopped; See below -Per last neurosurgical note there we will hold off on any consideration of surgical intervention until patient recovers from stenting the procedure and they feel that she is little bit more time to recover from her recent stroke before to consider any surgical interventions for her frontal meningioma and will be seeing the patient again on 11/05/2021  Large acute right paramedian pontine infarct in the setting of severe stenosis of the basilar arteries -Noted on MRI brain. -Stroke work-up underway and stroke pathway ordered. -CT angiogram head and neck ordered with no LVO, multivessel multifocal stenosis.  Focal marked stenosis of the basilar proximal to superior cerebellar artery origins. 2D echo ordered. -Patient was on aspirin prior to admission. -Permissive hypertension -Neurology consulted and feels stroke appears to be due to large vessel disease, also noted that patient seemed to have bradyphrenia and executive dysfunction which could be due to edema on the left frontal lobe. -2D echo with EF of 60 to 65%, NWMA, moderate asymmetric LVH of the basal septal, no embolic source noted. -TSH within normal limits at 0.993, folate decreased at 5.8, vitamin B12 at 287. -LDL of 133.  Patient is on a statin of Rosuvastatin 20 mg p.o. daily -EEG done and showed "This EEG was obtained while awake and asleep and is abnormal due to mild diffuse slowing indicative of global cerebral dysfunction and superimposed focal slowing over the left frontal region in the area of patient's known meningioma. Epileptiform abnormalities were not seen during this recording." -Continue statin with goal LDL < 70. -Patient currently on aspirin and Plavix for dual antiplatelet treatment per neurology and  they are recommending for at least 3 to 6 months now given that she got a stent recommendations. -Patient being followed by PT/OT/SLP and they are recommending CIR once ready for discharge -Per neurology and IR   Basilar Artery Stenosis -CT angiogram head and neck with marked stenosis of basilar artery proximal to SCA's. -Being followed by neurology who have consulted IR for further evaluation. -Patient seen in consultation by IR, Dr. Estanislado Pandy on 10/28/2021 and cerebral angiogram with angioplasty/possible stent placement within the basilar artery plan tentatively for Tuesday, 10/30/2021 under general anesthesia however has been rescheduled to Thursday, 11/01/2021.   -Per IR no need to hold aspirin and Plavix. -She is status post basilar artery stenosis stenting assisted angioplasty on 11/01/2021 and on dual antiplatelet therapy now; she unfortunately developed a hematoma proximal to the site of puncture it was about 10 to 12 cm in diameter and firm and mildly tender with palpation: IR followed the postprocedural hematoma protocol and packed with heat every few hours and if they feel it is enlarging negative consider further imaging and vascular surgery consultation  Right arm hematoma -See above  AKI on CKD stage IIIb -Stable. -Patient's BUNs/creatinine has gone from 45/1.22 is now slightly trended up to 48/1.43 yesterday and to 55/1.69 and improved with IV fluid hydration and went to 45/1.09 is now 51/1.61 today -IV fluid hydration is most -Avoid further nephrotoxic medications, contrast dyes, hypotension and dehydration and will need to renally adjust medications -Continue monitor and trend and repeat CMP in a.m.  Acute thrombocytopenia -Patient with no overt bleeding but now has a hematoma as above -Thrombocytopenia had resolved but is now back again and platelet count has gone from 156 -> 137 -> 148 and trended up to 178 and is now 165 -Continue to monitor and trend and repeat CMP  in  History of Endometrial Cancer -Patient follows with OB/GYN oncology, Dr. Berline Lopes in the outpatient setting. -Outpatient follow-up.   Hyponatremia -Mild. Na+ went from 135 -> 134 and trended up to 138 the day before yesterday but is now back down to 132 -Started IVF as above -Continue to Monitor and Trend and repeat CMP in the AM   Diabetes mellitus type 2 -Hemoglobin A1c 7.0 (10/26/2021) -Continue to hold oral hypoglycemic agents. -Elevated CBGs secondary to steroids. -CBGs ranging from 83-1 89 -Continue Semglee to 38 units daily. -Monitor CBGs with steroid taper. -Continue with moderate NovoLog/scale insulin before meals and at bedtime and may need to adjust further insulin regimen  Hypoalbuminemia -Patient's albumin level is now 3.0 -Continue monitor and trend and repeat CMP in a.m.  Hyperlipidemia -LDL of 33, goal LDL < 70 -Continue fenofibrate 160 mg p.o. daily and rosuvastatin 20 mg p.o. daily  Hypertension -Continue Irbesartan 150 mg daily but may need to hold given slightly worsened Renal Fxn but will re-evaluate in the AM   -Permissive hypertension secondary to acute CVA. -Avoid low blood pressure.    E. coli UTI -Urinalysis concerning for UTI. -Urine cultures > 100,000 colonies of E. coli pansensitive.   -Continued IV Rocephin to treat for total of 5 days as patient was to undergo cerebral angiogram with possible angioplasty versus stent placement.   -Patient has completed antibiotics at this time now  Obesity -Complicates overall prognosis and care -Estimated body mass index is 31.07 kg/m as calculated from the following:   Height as of this encounter: '5\' 5"'$  (1.651 m).   Weight as of this encounter: 84.7 kg.  -Weight Loss and Dietary Counseling given  DVT prophylaxis: enoxaparin (LOVENOX) injection 40 mg Start: 11/02/21 1000 Place and maintain sequential compression device Start: 10/26/21 1623 SCDs Start: 10/25/21 2158    Code Status: Full Code Family  Communication: No family currently at bedside  Disposition Plan:  Level of care: Progressive Status is: Inpatient Remains inpatient appropriate because: Needs CIR and pending bed availability   Consultants:  Neurology Neurosurgery PCCM/pulmonary Interventional radiology  Procedures:  CT head 10/25/2021 CT angiogram head and neck 10/26/2021  MRI brain 10/25/2021 2D echo 10/26/2021 EEG 10/26/2021 status post endovascular revascularization of the severely stenotic distal basilar artery with stent assisted angioplasty   Antimicrobials:  Anti-infectives (From admission, onward)    Start     Dose/Rate Route Frequency Ordered Stop   11/01/21 1332  ceFAZolin (ANCEF) 2-4 GM/100ML-% IVPB       Note to Pharmacy: Margaretmary Dys E: cabinet override      11/01/21 1332 11/02/21 0144   10/30/21 0900  cefTRIAXone (ROCEPHIN) 1 g in sodium chloride 0.9 % 100 mL IVPB        1 g 200 mL/hr over 30 Minutes Intravenous Every 24 hours 10/29/21 1230 10/31/21 0824   10/27/21 1000  cefTRIAXone (ROCEPHIN) 2 g in sodium chloride 0.9 % 100 mL IVPB        2 g 200 mL/hr over 30 Minutes Intravenous Every 24 hours 10/27/21 0910 10/29/21 0952        Subjective: Seen and examined at bedside and she states that she is doing okay.  She is  try to open her orders.  Denies chest pain or shortness of breath.  Had some significant upper extremity bruising.  No lightheadedness or dizziness.  No other concerns or complaints this time but continues to have left facial droop and dysarthria  Objective: Vitals:   11/04/21 0309 11/04/21 0655 11/04/21 0800 11/04/21 1049  BP: (!) 191/77 (!) 175/67 (!) 174/72 (!) 153/60  Pulse: 83 64 67 71  Resp: '14 12 16 19  '$ Temp: 97.8 F (36.6 C) 98 F (36.7 C) 98 F (36.7 C) 97.9 F (36.6 C)  TempSrc: Oral Oral Oral Oral  SpO2: 99% 97% 98% 95%  Weight:      Height:        Intake/Output Summary (Last 24 hours) at 11/04/2021 1414 Last data filed at 11/04/2021 1211 Gross per 24 hour   Intake --  Output 750 ml  Net -750 ml   Filed Weights   10/25/21 1531 11/01/21 1042 11/03/21 2318  Weight: 76.7 kg 76.7 kg 84.7 kg   Examination: Physical Exam:  Constitutional: WN/WD obese elderly Caucasian female currently no acute distress Respiratory: Diminished to auscultation bilaterally, no wheezing, rales, rhonchi or crackles. Normal respiratory effort and patient is not tachypenic. No accessory muscle use.  Unlabored breathing Cardiovascular: RRR, no murmurs / rubs / gallops. S1 and S2 auscultated.  Abdomen: Soft, non-tender, distended secondary body habitus. Bowel sounds positive.  GU: Deferred. Musculoskeletal: No clubbing / cyanosis of digits/nails limited skin evaluation. No joint deformity upper and lower extremities.  Skin: Has continued bruising in upper extremities Neurologic: CN 2-12 grossly intact with no focal deficits with a left-sided facial droop and some dysarthria. Romberg sign and cerebellar reflexes not assessed.  Psychiatric: Normal judgment and insight. Alert and oriented x 2  Data Reviewed: I have personally reviewed following labs and imaging studies  CBC: Recent Labs  Lab 10/31/21 0108 11/01/21 0309 11/01/21 1818 11/01/21 1845 11/02/21 0528 11/04/21 0259  WBC 10.5 9.9 8.7  --  10.9* 7.4  NEUTROABS 8.5* 8.2* 7.2  --  8.9* 5.9  HGB 13.9 13.6 13.1 12.9 13.1 13.2  HCT 40.5 39.7 39.4 38.0 40.0 39.1  MCV 90.0 89.2 92.5  --  92.8 90.7  PLT 137* 148* 136*  --  178 841   Basic Metabolic Panel: Recent Labs  Lab 10/30/21 0449 10/31/21 0108 11/01/21 0309 11/01/21 1845 11/02/21 0528 11/04/21 0259  NA 138 135 134* 139 138 132*  K 4.5 4.7 4.9 4.5 4.3 4.9  CL 102 99 99  --  103 102  CO2 '26 26 25  '$ --  22 22  GLUCOSE 221* 277* 265*  --  175* 221*  BUN 45* 48* 55*  --  45* 51*  CREATININE 1.22* 1.43* 1.69*  --  1.09* 1.16*  CALCIUM 9.1 9.0 9.1  --  8.2* 8.7*  MG  --  2.3 2.2  --  2.0 2.3  PHOS  --   --  3.6  --  4.2 3.9   GFR: Estimated  Creatinine Clearance: 40.2 mL/min (A) (by C-G formula based on SCr of 1.16 mg/dL (H)). Liver Function Tests: Recent Labs  Lab 11/01/21 0309 11/02/21 0528 11/04/21 0259  AST 12* 13* 14*  ALT '10 10 11  '$ ALKPHOS 59 49 55  BILITOT 0.4 0.3 0.6  PROT 5.8* 5.6* 6.2*  ALBUMIN 2.8* 2.7* 3.0*   No results for input(s): "LIPASE", "AMYLASE" in the last 168 hours. No results for input(s): "AMMONIA" in the last 168 hours. Coagulation Profile: Recent Labs  Lab 10/30/21 0449 11/01/21 1127  INR 1.2 1.2   Cardiac Enzymes: No results for input(s): "CKTOTAL", "CKMB", "CKMBINDEX", "TROPONINI" in the last 168 hours. BNP (last 3 results) No results for input(s): "PROBNP" in the last 8760 hours. HbA1C: No results for input(s): "HGBA1C" in the last 72 hours. CBG: Recent Labs  Lab 11/03/21 1124 11/03/21 1543 11/03/21 2104 11/04/21 0817 11/04/21 1209  GLUCAP 83 110* 189* 156* 138*   Lipid Profile: Recent Labs    11/02/21 0528  TRIG 147   Thyroid Function Tests: No results for input(s): "TSH", "T4TOTAL", "FREET4", "T3FREE", "THYROIDAB" in the last 72 hours. Anemia Panel: No results for input(s): "VITAMINB12", "FOLATE", "FERRITIN", "TIBC", "IRON", "RETICCTPCT" in the last 72 hours. Sepsis Labs: No results for input(s): "PROCALCITON", "LATICACIDVEN" in the last 168 hours.  Recent Results (from the past 240 hour(s))  Urine Culture     Status: Abnormal   Collection Time: 10/27/21  9:10 AM   Specimen: Urine, Catheterized  Result Value Ref Range Status   Specimen Description URINE, CATHETERIZED  Final   Special Requests   Final    NONE Performed at [facility], [address], [place], [place] [address]    Culture >=100,000 COLONIES/mL ESCHERICHIA COLI (A)  Final   Report Status 10/29/2021 FINAL  Final   Organism ID, Bacteria ESCHERICHIA COLI (A)  Final      Susceptibility   Escherichia coli - MIC*    AMPICILLIN 8 SENSITIVE Sensitive     CEFAZOLIN <=4 SENSITIVE Sensitive      CEFEPIME <=0.12 SENSITIVE Sensitive     CEFTRIAXONE <=0.25 SENSITIVE Sensitive     CIPROFLOXACIN <=0.25 SENSITIVE Sensitive     GENTAMICIN <=1 SENSITIVE Sensitive     IMIPENEM <=0.25 SENSITIVE Sensitive     NITROFURANTOIN 32 SENSITIVE Sensitive     TRIMETH/SULFA <=20 SENSITIVE Sensitive     AMPICILLIN/SULBACTAM 8 SENSITIVE Sensitive     PIP/TAZO <=4 SENSITIVE Sensitive     * >=100,000 COLONIES/mL ESCHERICHIA COLI  MRSA Next Gen by PCR, Nasal     Status: None   Collection Time: 11/02/21  4:13 PM   Specimen: Nasal Mucosa; Nasal Swab  Result Value Ref Range Status   MRSA by PCR Next Gen NOT DETECTED NOT DETECTED Final    Comment: (NOTE) The GeneXpert MRSA Assay (FDA approved for NASAL specimens only), is one component of a comprehensive MRSA colonization surveillance program. It is not intended to diagnose MRSA infection nor to guide or monitor treatment for MRSA infections. Test performance is not FDA approved in patients less than 57 years old. Performed at [facility], [place] [address] [place], [place] [address]      Radiology Studies: No results found.   Scheduled Meds:  amLODipine  10 mg Oral Daily   aspirin  81 mg Oral Daily   Or   aspirin  81 mg Per Tube Daily   calcium carbonate  1,250 mg Oral Q breakfast   Chlorhexidine Gluconate Cloth  6 each Topical Daily   clopidogrel  75 mg Oral Daily   Or   clopidogrel  75 mg Per Tube Daily   dexamethasone (DECADRON) injection  4 mg Intravenous Q12H   docusate sodium  100 mg Oral BID   enoxaparin (LOVENOX) injection  40 mg Subcutaneous Q24H   fenofibrate  160 mg Oral QPC breakfast   folic acid  1 mg Oral Daily   insulin aspart  0-15 Units Subcutaneous TID WC   insulin aspart  0-5  Units Subcutaneous QHS   insulin glargine-yfgn  32 Units Subcutaneous Daily   irbesartan  75 mg Oral Daily   pantoprazole  40 mg Oral Daily   polyethylene glycol  17 g Oral Daily   rosuvastatin  20 mg Oral Daily   Continuous  Infusions:   LOS: 9 days   Raiford Noble, DO [facility] Available via Epic secure chat 7am-7pm After these hours, please refer to coverage provider listed on [email or web address] 11/04/2021, 2:14 PM

## 2021-11-04 NOTE — Progress Notes (Signed)
Physical Therapy Treatment Patient Details Name: Cassandra Dodson MRN: [id number] DOB: 1940-12-10 Today's Date: 11/04/2021   History of Present Illness Pt is an 81 y/o female admitted secondary to slurred speech and L sided weakness. 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 PMH including but not limited to HTN, DM, CKD3, GERD and endometrial cancer.    PT Comments    Pt lethargic this session but agreeable to participate with some encouragement. Pt with bout of bowel incontinence, able to roll with modA for pericare and linen change. Requiring modA+2 for supine <> sit and for STS transfers. Pt with decreased ability to find midline while sitting EOB despite cueing from therapist. Progressed sitting balance this session with head turns and trunk rotations. PT will continue to follow while inpatient to improve strength/balance and progress towards ambulation. AIR remains most appropriate discharge disposition to maximize functional independence.    Recommendations for follow up therapy are one component of a multi-disciplinary discharge planning process, led by the attending physician.  Recommendations may be updated based on patient status, additional functional criteria and insurance authorization.  Follow Up Recommendations  Acute inpatient rehab (3hours/day)     Assistance Recommended at Discharge Frequent or constant Supervision/Assistance  Patient can return home with the following Assistance with cooking/housework;Direct supervision/assist for medications management;Direct supervision/assist for financial management;Assist for transportation;Help with stairs or ramp for  entrance;A lot of help with walking and/or transfers;A lot of help with bathing/dressing/bathroom   Equipment Recommendations  None recommended by PT    Recommendations for Other Services Rehab consult     Precautions / Restrictions Precautions Precautions: Fall Restrictions Weight Bearing Restrictions: No     Mobility  Bed Mobility Overal bed mobility: Needs Assistance Bed Mobility: Supine to Sit, Sit to Supine, Rolling Rolling: Mod assist   Supine to sit: Mod assist, +2 for physical assistance Sit to supine: Mod assist, +2 for physical assistance   General bed mobility comments: able to roll x 3 for pericare with modA and cues for using bed rails. modA+2 for supine <>sit for LLE and trunk assist    Transfers Overall transfer level: Needs assistance Equipment used: Rolling walker (2 wheels) Transfers: Sit to/from Stand Sit to Stand: Mod assist, +2 physical assistance           General transfer comment: Mod of 2 for power up with cues for upright posture and anterior weight shit. Pt with Left/Anterior lean in standing. Cues for keeping hands on walker    Ambulation/Gait                   Stairs             Wheelchair Mobility    Modified Rankin (Stroke Patients Only)       Balance Overall balance assessment: Needs assistance Sitting-balance support: Feet supported, Bilateral upper extremity supported Sitting balance-Leahy Scale: Poor Sitting balance - Comments: left lateral lean with cues for finding midline Postural control: Left lateral lean Standing balance support: Bilateral upper extremity supported Standing balance-Leahy Scale: Poor Standing balance comment: reliance on RW and manual support to maintain  Cognition Arousal/Alertness: Lethargic Behavior During Therapy: Flat affect Overall Cognitive Status: Impaired/Different from baseline Area of Impairment: Orientation, Attention, Memory,  Following commands, Safety/judgement, Awareness, Problem solving                 Orientation Level: Disoriented to, Time Current Attention Level: Sustained Memory: Decreased short-term memory Following Commands: Follows one step commands with increased time Safety/Judgement: Decreased awareness of deficits Awareness: Emergent Problem Solving: Slow processing, Decreased initiation, Difficulty sequencing, Requires verbal cues General Comments: pt unable to state correct day or year. Asking about "the other bride" but oriented to place when asked. increased time and cueing for command following        Exercises Other Exercises Other Exercises: head turns in sitting x10 Other Exercises: trunk rotations in sitting x 10    General Comments        Pertinent Vitals/Pain Pain Assessment Pain Assessment: No/denies pain    Home Living                          Prior Function            PT Goals (current goals can now be found in the care plan section) Acute Rehab PT Goals Patient Stated Goal: to go home PT Goal Formulation: With patient/family Time For Goal Achievement: 11/16/21 Potential to Achieve Goals: Good Progress towards PT goals: Progressing toward goals    Frequency    Min 4X/week      PT Plan Current plan remains appropriate    Co-evaluation              AM-PAC PT "6 Clicks" Mobility   Outcome Measure  Help needed turning from your back to your side while in a flat bed without using bedrails?: A Lot Help needed moving from lying on your back to sitting on the side of a flat bed without using bedrails?: A Lot Help needed moving to and from a bed to a chair (including a wheelchair)?: Total Help needed standing up from a chair using your arms (e.g., wheelchair or bedside chair)?: A Lot Help needed to walk in hospital room?: Total Help needed climbing 3-5 steps with a railing? : Total 6 Click Score: 9    End of Session Equipment  Utilized During Treatment: Gait belt Activity Tolerance: Patient tolerated treatment well Patient left: in bed;with call bell/phone within reach;with bed alarm set Nurse Communication: Mobility status PT Visit Diagnosis: Other abnormalities of gait and mobility (R26.89);Other symptoms and signs involving the nervous system (R29.898)     Time: 5188-4166 PT Time Calculation (min) (ACUTE ONLY): 33 min  Charges:  $Therapeutic Activity: 8-22 mins $Neuromuscular Re-education: 8-22 mins                     Mackie Pai, SPT Acute Rehabilitation Services  Office: [place] 11/04/2021, 4:06 PM

## 2021-11-05 ENCOUNTER — Other Ambulatory Visit: Payer: Self-pay

## 2021-11-05 ENCOUNTER — Inpatient Hospital Stay (HOSPITAL_COMMUNITY)
Admission: RE | Admit: 2021-11-05 | Discharge: 2021-11-27 | DRG: 057 | Disposition: A | Discharge status: Home Health | Payer: Medicare Other | Source: Intra-hospital | Attending: Physical Medicine and Rehabilitation | Admitting: Physical Medicine and Rehabilitation

## 2021-11-05 DIAGNOSIS — E1169 Type 2 diabetes mellitus with other specified complication: Secondary | ICD-10-CM | POA: Diagnosis not present

## 2021-11-05 DIAGNOSIS — Z20822 Contact with and (suspected) exposure to covid-19: Secondary | ICD-10-CM | POA: Diagnosis present

## 2021-11-05 DIAGNOSIS — I69391 Dysphagia following cerebral infarction: Secondary | ICD-10-CM

## 2021-11-05 DIAGNOSIS — M1712 Unilateral primary osteoarthritis, left knee: Secondary | ICD-10-CM | POA: Diagnosis present

## 2021-11-05 DIAGNOSIS — I6350 Cerebral infarction due to unspecified occlusion or stenosis of unspecified cerebral artery: Secondary | ICD-10-CM | POA: Diagnosis not present

## 2021-11-05 DIAGNOSIS — E1122 Type 2 diabetes mellitus with diabetic chronic kidney disease: Secondary | ICD-10-CM | POA: Diagnosis present

## 2021-11-05 DIAGNOSIS — G9389 Other specified disorders of brain: Secondary | ICD-10-CM | POA: Diagnosis not present

## 2021-11-05 DIAGNOSIS — R1310 Dysphagia, unspecified: Secondary | ICD-10-CM | POA: Diagnosis present

## 2021-11-05 DIAGNOSIS — E669 Obesity, unspecified: Secondary | ICD-10-CM | POA: Diagnosis not present

## 2021-11-05 DIAGNOSIS — N1831 Chronic kidney disease, stage 3a: Secondary | ICD-10-CM | POA: Diagnosis present

## 2021-11-05 DIAGNOSIS — Z923 Personal history of irradiation: Secondary | ICD-10-CM

## 2021-11-05 DIAGNOSIS — I69319 Unspecified symptoms and signs involving cognitive functions following cerebral infarction: Secondary | ICD-10-CM

## 2021-11-05 DIAGNOSIS — N179 Acute kidney failure, unspecified: Secondary | ICD-10-CM | POA: Diagnosis not present

## 2021-11-05 DIAGNOSIS — R4182 Altered mental status, unspecified: Secondary | ICD-10-CM

## 2021-11-05 DIAGNOSIS — I69322 Dysarthria following cerebral infarction: Secondary | ICD-10-CM

## 2021-11-05 DIAGNOSIS — I129 Hypertensive chronic kidney disease with stage 1 through stage 4 chronic kidney disease, or unspecified chronic kidney disease: Secondary | ICD-10-CM | POA: Diagnosis present

## 2021-11-05 DIAGNOSIS — E039 Hypothyroidism, unspecified: Secondary | ICD-10-CM | POA: Diagnosis present

## 2021-11-05 DIAGNOSIS — I69398 Other sequelae of cerebral infarction: Secondary | ICD-10-CM | POA: Diagnosis not present

## 2021-11-05 DIAGNOSIS — Z823 Family history of stroke: Secondary | ICD-10-CM

## 2021-11-05 DIAGNOSIS — I69354 Hemiplegia and hemiparesis following cerebral infarction affecting left non-dominant side: Secondary | ICD-10-CM | POA: Diagnosis present

## 2021-11-05 DIAGNOSIS — Z8542 Personal history of malignant neoplasm of other parts of uterus: Secondary | ICD-10-CM | POA: Diagnosis not present

## 2021-11-05 DIAGNOSIS — I651 Occlusion and stenosis of basilar artery: Secondary | ICD-10-CM

## 2021-11-05 DIAGNOSIS — B965 Pseudomonas (aeruginosa) (mallei) (pseudomallei) as the cause of diseases classified elsewhere: Secondary | ICD-10-CM | POA: Diagnosis not present

## 2021-11-05 DIAGNOSIS — L89152 Pressure ulcer of sacral region, stage 2: Secondary | ICD-10-CM | POA: Diagnosis not present

## 2021-11-05 DIAGNOSIS — R2689 Other abnormalities of gait and mobility: Secondary | ICD-10-CM | POA: Diagnosis present

## 2021-11-05 DIAGNOSIS — Z96652 Presence of left artificial knee joint: Secondary | ICD-10-CM | POA: Diagnosis present

## 2021-11-05 DIAGNOSIS — R900 Intracranial space-occupying lesion found on diagnostic imaging of central nervous system: Secondary | ICD-10-CM | POA: Diagnosis not present

## 2021-11-05 DIAGNOSIS — N1832 Chronic kidney disease, stage 3b: Secondary | ICD-10-CM | POA: Diagnosis present

## 2021-11-05 DIAGNOSIS — Z8249 Family history of ischemic heart disease and other diseases of the circulatory system: Secondary | ICD-10-CM

## 2021-11-05 DIAGNOSIS — N319 Neuromuscular dysfunction of bladder, unspecified: Secondary | ICD-10-CM | POA: Diagnosis present

## 2021-11-05 DIAGNOSIS — Z884 Allergy status to anesthetic agent status: Secondary | ICD-10-CM

## 2021-11-05 DIAGNOSIS — E871 Hypo-osmolality and hyponatremia: Secondary | ICD-10-CM | POA: Diagnosis not present

## 2021-11-05 DIAGNOSIS — E785 Hyperlipidemia, unspecified: Secondary | ICD-10-CM | POA: Diagnosis present

## 2021-11-05 DIAGNOSIS — L89151 Pressure ulcer of sacral region, stage 1: Secondary | ICD-10-CM | POA: Diagnosis present

## 2021-11-05 DIAGNOSIS — R5383 Other fatigue: Secondary | ICD-10-CM | POA: Diagnosis not present

## 2021-11-05 DIAGNOSIS — F419 Anxiety disorder, unspecified: Secondary | ICD-10-CM | POA: Diagnosis not present

## 2021-11-05 DIAGNOSIS — E1165 Type 2 diabetes mellitus with hyperglycemia: Secondary | ICD-10-CM | POA: Diagnosis present

## 2021-11-05 DIAGNOSIS — N390 Urinary tract infection, site not specified: Secondary | ICD-10-CM | POA: Diagnosis not present

## 2021-11-05 DIAGNOSIS — Z7984 Long term (current) use of oral hypoglycemic drugs: Secondary | ICD-10-CM

## 2021-11-05 DIAGNOSIS — Z6829 Body mass index (BMI) 29.0-29.9, adult: Secondary | ICD-10-CM

## 2021-11-05 DIAGNOSIS — Z96641 Presence of right artificial hip joint: Secondary | ICD-10-CM | POA: Diagnosis present

## 2021-11-05 DIAGNOSIS — H9190 Unspecified hearing loss, unspecified ear: Secondary | ICD-10-CM | POA: Diagnosis present

## 2021-11-05 DIAGNOSIS — E663 Overweight: Secondary | ICD-10-CM | POA: Diagnosis present

## 2021-11-05 DIAGNOSIS — E1140 Type 2 diabetes mellitus with diabetic neuropathy, unspecified: Secondary | ICD-10-CM | POA: Diagnosis present

## 2021-11-05 DIAGNOSIS — I10 Essential (primary) hypertension: Secondary | ICD-10-CM | POA: Diagnosis present

## 2021-11-05 DIAGNOSIS — L8990 Pressure ulcer of unspecified site, unspecified stage: Secondary | ICD-10-CM | POA: Diagnosis present

## 2021-11-05 DIAGNOSIS — K592 Neurogenic bowel, not elsewhere classified: Secondary | ICD-10-CM | POA: Diagnosis present

## 2021-11-05 DIAGNOSIS — Z794 Long term (current) use of insulin: Secondary | ICD-10-CM

## 2021-11-05 DIAGNOSIS — K219 Gastro-esophageal reflux disease without esophagitis: Secondary | ICD-10-CM | POA: Diagnosis present

## 2021-11-05 DIAGNOSIS — E11649 Type 2 diabetes mellitus with hypoglycemia without coma: Secondary | ICD-10-CM | POA: Diagnosis not present

## 2021-11-05 DIAGNOSIS — R4702 Dysphasia: Secondary | ICD-10-CM | POA: Diagnosis not present

## 2021-11-05 DIAGNOSIS — Z800 Family history of malignant neoplasm of digestive organs: Secondary | ICD-10-CM

## 2021-11-05 DIAGNOSIS — Z87891 Personal history of nicotine dependence: Secondary | ICD-10-CM

## 2021-11-05 DIAGNOSIS — R1312 Dysphagia, oropharyngeal phase: Secondary | ICD-10-CM | POA: Diagnosis not present

## 2021-11-05 DIAGNOSIS — D329 Benign neoplasm of meninges, unspecified: Secondary | ICD-10-CM | POA: Diagnosis present

## 2021-11-05 DIAGNOSIS — D631 Anemia in chronic kidney disease: Secondary | ICD-10-CM | POA: Diagnosis present

## 2021-11-05 DIAGNOSIS — D649 Anemia, unspecified: Secondary | ICD-10-CM

## 2021-11-05 DIAGNOSIS — Z7982 Long term (current) use of aspirin: Secondary | ICD-10-CM

## 2021-11-05 DIAGNOSIS — N3000 Acute cystitis without hematuria: Secondary | ICD-10-CM | POA: Diagnosis not present

## 2021-11-05 DIAGNOSIS — N183 Chronic kidney disease, stage 3 unspecified: Secondary | ICD-10-CM | POA: Diagnosis not present

## 2021-11-05 DIAGNOSIS — Z79899 Other long term (current) drug therapy: Secondary | ICD-10-CM

## 2021-11-05 DIAGNOSIS — Z515 Encounter for palliative care: Secondary | ICD-10-CM | POA: Diagnosis not present

## 2021-11-05 HISTORY — PX: IR ANGIO VERTEBRAL SEL VERTEBRAL UNI R MOD SED: IMG5368

## 2021-11-05 HISTORY — PX: IR ANGIO INTRA EXTRACRAN SEL COM CAROTID INNOMINATE UNI R MOD SED: IMG5359

## 2021-11-05 LAB — CBC WITH DIFFERENTIAL/PLATELET
Abs Immature Granulocytes: 0.06 10*3/uL (ref 0.00–0.07)
Basophils Absolute: 0 10*3/uL (ref 0.0–0.1)
Basophils Relative: 0 %
Eosinophils Absolute: 0 10*3/uL (ref 0.0–0.5)
Eosinophils Relative: 0 %
HCT: 38.1 % (ref 36.0–46.0)
Hemoglobin: 12.7 g/dL (ref 12.0–15.0)
Immature Granulocytes: 1 %
Lymphocytes Relative: 10 %
Lymphs Abs: 0.9 10*3/uL (ref 0.7–4.0)
MCH: 30.5 pg (ref 26.0–34.0)
MCHC: 33.3 g/dL (ref 30.0–36.0)
MCV: 91.4 fL (ref 80.0–100.0)
Monocytes Absolute: 0.6 10*3/uL (ref 0.1–1.0)
Monocytes Relative: 7 %
Neutro Abs: 7.5 10*3/uL (ref 1.7–7.7)
Neutrophils Relative %: 82 %
Platelets: 167 10*3/uL (ref 150–400)
RBC: 4.17 MIL/uL (ref 3.87–5.11)
RDW: 14.6 % (ref 11.5–15.5)
WBC: 9.1 10*3/uL (ref 4.0–10.5)
nRBC: 0 % (ref 0.0–0.2)

## 2021-11-05 LAB — COMPREHENSIVE METABOLIC PANEL
ALT: 10 U/L (ref 0–44)
AST: 13 U/L — ABNORMAL LOW (ref 15–41)
Albumin: 2.8 g/dL — ABNORMAL LOW (ref 3.5–5.0)
Alkaline Phosphatase: 56 U/L (ref 38–126)
Anion gap: 8 (ref 5–15)
BUN: 52 mg/dL — ABNORMAL HIGH (ref 8–23)
CO2: 21 mmol/L — ABNORMAL LOW (ref 22–32)
Calcium: 8.7 mg/dL — ABNORMAL LOW (ref 8.9–10.3)
Chloride: 104 mmol/L (ref 98–111)
Creatinine, Ser: 1.45 mg/dL — ABNORMAL HIGH (ref 0.44–1.00)
GFR, Estimated: 36 mL/min — ABNORMAL LOW (ref >60)
Glucose, Bld: 206 mg/dL — ABNORMAL HIGH (ref 70–99)
Potassium: 5 mmol/L (ref 3.5–5.1)
Sodium: 133 mmol/L — ABNORMAL LOW (ref 135–145)
Total Bilirubin: 0.8 mg/dL (ref 0.3–1.2)
Total Protein: 5.7 g/dL — ABNORMAL LOW (ref 6.5–8.1)

## 2021-11-05 LAB — PHOSPHORUS: Phosphorus: 3.6 mg/dL (ref 2.5–4.6)

## 2021-11-05 LAB — GLUCOSE, CAPILLARY
Glucose-Capillary: 168 mg/dL — ABNORMAL HIGH (ref 70–99)
Glucose-Capillary: 230 mg/dL — ABNORMAL HIGH (ref 70–99)
Glucose-Capillary: 187 mg/dL — ABNORMAL HIGH (ref 70–99)
Glucose-Capillary: 211 mg/dL — ABNORMAL HIGH (ref 70–99)

## 2021-11-05 LAB — MAGNESIUM: Magnesium: 2.1 mg/dL (ref 1.7–2.4)

## 2021-11-05 LAB — POCT ACTIVATED CLOTTING TIME: Activated Clotting Time: 221 seconds

## 2021-11-05 MED ORDER — FENOFIBRATE 160 MG PO TABS
160.0000 mg | ORAL_TABLET | Freq: Every day | ORAL | Status: DC
  Administered 2021-11-06 – 2021-11-27 (×22): 160 mg via ORAL
  Filled 2021-11-05 (×22): qty 1

## 2021-11-05 MED ORDER — DEXAMETHASONE 4 MG PO TABS
4.0000 mg | ORAL_TABLET | Freq: Two times a day (BID) | ORAL | Status: DC
Start: 2021-11-05 — End: 2021-11-09
  Administered 2021-11-05 – 2021-11-09 (×8): 4 mg via ORAL
  Filled 2021-11-05 (×8): qty 1

## 2021-11-05 MED ORDER — PANTOPRAZOLE SODIUM 40 MG PO TBEC
40.0000 mg | DELAYED_RELEASE_TABLET | Freq: Every day | ORAL | Status: DC
  Administered 2021-11-06 – 2021-11-22 (×17): 40 mg via ORAL
  Filled 2021-11-05 (×17): qty 1

## 2021-11-05 MED ORDER — ACETAMINOPHEN 325 MG PO TABS
650.0000 mg | ORAL_TABLET | ORAL | Status: DC | PRN
  Administered 2021-11-16 – 2021-11-17 (×2): 650 mg via ORAL
  Filled 2021-11-05 (×3): qty 2

## 2021-11-05 MED ORDER — DEXAMETHASONE SODIUM PHOSPHATE 4 MG/ML IJ SOLN: INTRAMUSCULAR | 0 refills | Status: DC

## 2021-11-05 MED ORDER — DOCUSATE SODIUM 100 MG PO CAPS
100.0000 mg | ORAL_CAPSULE | Freq: Two times a day (BID) | ORAL | Status: DC
  Administered 2021-11-05 – 2021-11-27 (×43): 100 mg via ORAL
  Filled 2021-11-05 (×44): qty 1

## 2021-11-05 MED ORDER — INSULIN GLARGINE-YFGN 100 UNIT/ML ~~LOC~~ SOLN
32.0000 [IU] | Freq: Every day | SUBCUTANEOUS | Status: DC
  Administered 2021-11-06: 32 [IU] via SUBCUTANEOUS
  Filled 2021-11-05: qty 0.32

## 2021-11-05 MED ORDER — AMLODIPINE BESYLATE 10 MG PO TABS: 10.0000 mg | ORAL_TABLET | Freq: Every day | ORAL | 0 refills | Status: DC

## 2021-11-05 MED ORDER — CLOPIDOGREL BISULFATE 75 MG PO TABS: 75.0000 mg | ORAL_TABLET | Freq: Every day | ORAL | Status: DC

## 2021-11-05 MED ORDER — ACETAMINOPHEN 650 MG RE SUPP: 650.0000 mg | RECTAL | Status: DC | PRN

## 2021-11-05 MED ORDER — ORAL CARE MOUTH RINSE
15.0000 mL | OROMUCOSAL | 0 refills | Status: DC | PRN
Start: 2021-11-05 — End: 2022-03-01

## 2021-11-05 MED ORDER — DOCUSATE SODIUM 100 MG PO CAPS: 100.0000 mg | ORAL_CAPSULE | Freq: Two times a day (BID) | ORAL | 0 refills | Status: DC

## 2021-11-05 MED ORDER — CLOPIDOGREL BISULFATE 75 MG PO TABS
75.0000 mg | ORAL_TABLET | Freq: Every day | ORAL | Status: DC
  Administered 2021-11-06 – 2021-11-27 (×21): 75 mg via ORAL
  Filled 2021-11-05 (×21): qty 1

## 2021-11-05 MED ORDER — CALCIUM CARBONATE 1250 (500 CA) MG PO TABS
1250.0000 mg | ORAL_TABLET | Freq: Every day | ORAL | Status: DC
  Administered 2021-11-06 – 2021-11-27 (×22): 1250 mg via ORAL
  Filled 2021-11-05 (×22): qty 1

## 2021-11-05 MED ORDER — ACETAMINOPHEN 160 MG/5ML PO SOLN: 650.0000 mg | ORAL | Status: DC | PRN

## 2021-11-05 MED ORDER — ASPIRIN 81 MG PO CHEW
81.0000 mg | CHEWABLE_TABLET | Freq: Every day | ORAL | Status: DC
  Administered 2021-11-06 – 2021-11-10 (×2): 81 mg
  Filled 2021-11-05 (×11): qty 1

## 2021-11-05 MED ORDER — IRBESARTAN 75 MG PO TABS
75.0000 mg | ORAL_TABLET | Freq: Every day | ORAL | Status: DC
Start: 2021-11-06 — End: 2021-11-06
  Administered 2021-11-06: 75 mg via ORAL
  Filled 2021-11-05: qty 1

## 2021-11-05 MED ORDER — ASPIRIN 81 MG PO CHEW
81.0000 mg | CHEWABLE_TABLET | Freq: Every day | ORAL | Status: DC
  Administered 2021-11-07 – 2021-11-27 (×20): 81 mg via ORAL
  Filled 2021-11-05 (×22): qty 1

## 2021-11-05 MED ORDER — PANTOPRAZOLE SODIUM 40 MG PO TBEC: 40.0000 mg | DELAYED_RELEASE_TABLET | Freq: Every day | ORAL | Status: DC

## 2021-11-05 MED ORDER — ENOXAPARIN SODIUM 40 MG/0.4ML IJ SOSY
40.0000 mg | PREFILLED_SYRINGE | INTRAMUSCULAR | Status: DC
  Administered 2021-11-06 – 2021-11-27 (×22): 40 mg via SUBCUTANEOUS
  Filled 2021-11-05 (×22): qty 0.4

## 2021-11-05 MED ORDER — ROSUVASTATIN CALCIUM 20 MG PO TABS
20.0000 mg | ORAL_TABLET | Freq: Every day | ORAL | Status: DC
  Administered 2021-11-06 – 2021-11-27 (×22): 20 mg via ORAL
  Filled 2021-11-05 (×22): qty 1

## 2021-11-05 MED ORDER — FOLIC ACID 1 MG PO TABS: 1.0000 mg | ORAL_TABLET | Freq: Every day | ORAL | Status: DC

## 2021-11-05 MED ORDER — ROSUVASTATIN CALCIUM 20 MG PO TABS: 20.0000 mg | ORAL_TABLET | Freq: Every day | ORAL | Status: DC

## 2021-11-05 MED ORDER — IRBESARTAN 75 MG PO TABS: 75.0000 mg | ORAL_TABLET | Freq: Every day | ORAL | Status: DC

## 2021-11-05 MED ORDER — POLYETHYLENE GLYCOL 3350 17 G PO PACK: 17.0000 g | PACK | Freq: Every day | ORAL | 0 refills | Status: DC

## 2021-11-05 MED ORDER — ALPRAZOLAM 0.25 MG PO TABS: 0.2500 mg | ORAL_TABLET | Freq: Every evening | ORAL | Status: DC | PRN

## 2021-11-05 MED ORDER — CLOPIDOGREL BISULFATE 75 MG PO TABS
75.0000 mg | ORAL_TABLET | Freq: Every day | ORAL | Status: DC
  Administered 2021-11-21: 75 mg
  Filled 2021-11-05 (×12): qty 1

## 2021-11-05 MED ORDER — CALCIUM CARBONATE 1250 (500 CA) MG PO TABS: 1250.0000 mg | ORAL_TABLET | Freq: Every day | ORAL | Status: DC

## 2021-11-05 MED ORDER — FOLIC ACID 1 MG PO TABS
1.0000 mg | ORAL_TABLET | Freq: Every day | ORAL | Status: DC
  Administered 2021-11-06 – 2021-11-27 (×22): 1 mg via ORAL
  Filled 2021-11-05 (×22): qty 1

## 2021-11-05 MED ORDER — INSULIN ASPART 100 UNIT/ML IJ SOLN
0.0000 [IU] | Freq: Three times a day (TID) | INTRAMUSCULAR | Status: DC
  Administered 2021-11-05: 3 [IU] via SUBCUTANEOUS
  Administered 2021-11-06: 2 [IU] via SUBCUTANEOUS
  Administered 2021-11-06: 3 [IU] via SUBCUTANEOUS
  Administered 2021-11-06 – 2021-11-07 (×2): 5 [IU] via SUBCUTANEOUS
  Administered 2021-11-07 (×2): 3 [IU] via SUBCUTANEOUS

## 2021-11-05 MED ORDER — ACETAMINOPHEN 325 MG PO TABS: 650.0000 mg | ORAL_TABLET | ORAL | Status: DC | PRN

## 2021-11-05 MED ORDER — ENOXAPARIN SODIUM 40 MG/0.4ML IJ SOSY: 40.0000 mg | PREFILLED_SYRINGE | INTRAMUSCULAR | Status: DC

## 2021-11-05 MED ORDER — POLYETHYLENE GLYCOL 3350 17 G PO PACK
17.0000 g | PACK | Freq: Every day | ORAL | Status: DC
  Administered 2021-11-07 – 2021-11-21 (×15): 17 g via ORAL
  Filled 2021-11-05 (×15): qty 1

## 2021-11-05 MED ORDER — AMLODIPINE BESYLATE 10 MG PO TABS
10.0000 mg | ORAL_TABLET | Freq: Every day | ORAL | Status: DC
  Administered 2021-11-06 – 2021-11-27 (×22): 10 mg via ORAL
  Filled 2021-11-05 (×22): qty 1

## 2021-11-05 NOTE — H&P (Signed)
Physical Medicine and Rehabilitation Admission H&P        Chief Complaint  Patient presents with   Weakness   Slurred Speech  : HPI: Cassandra Dodson is an 81 year old right-handed female history of endometrial cancer, hypertension, type 2 diabetes mellitus, hypothyroidism, CKD stage III, quit smoking 19 years ago, history of left hip and bilateral knee replacement.  Per chart review patient lives alone.  1 level apartment.  She does have a son in the area.  Used a rolling walker prior to admission.  She does have an aide 2-3 days a week.  Presented 10/25/2021 to [facility] with acute onset of slurred speech and left-sided weakness x2 days.  CT/MRI showed large acute right paramedian pontine infarction.  Anterior cranial fossa meningioma with large amount of surrounding vasogenic edema in the inferior left frontal lobe.  Rightward midline shift with subfalcine herniation of the left cingulate gyrus.  CT angiogram head and neck showed no large vessel occlusion.  Plaque at the ICA origin causing less than 50% stenosis.  Marked stenosis of BA proximal to SCA's.  Given high risk of recurrent stroke with devastating outcome patient underwent basilar artery stenting assisted angioplasty 7/13 per interventional radiology.  Initially placed on intravenous heparin since transition to aspirin 81 mg daily and Plavix 75 mg daily x3 to 6 months.  Neurosurgery follow-up in regards to left frontal large meningioma Dr. Ellene Route plan for possible surgical resection 8-12 weeks.  Patient was placed on Decadron therapy.  Lovenox for DVT prophylaxis.  Blood pressure monitored initially maintained on Cleviprex.  She is currently on a dysphagia #3 nectar thick liquid diet.  Therapy evaluations completed due to patient decreased functional mobility was admitted for a comprehensive rehab program.   Review of Systems  Constitutional:  Negative for chills and fever.  HENT:  Negative for hearing loss.   Eyes:  Negative for blurred  vision and double vision.  Respiratory:  Negative for cough and shortness of breath.   Cardiovascular:  Negative for chest pain, palpitations and leg swelling.  Gastrointestinal:  Positive for constipation. Negative for heartburn, nausea and vomiting.       GERD  Genitourinary:  Negative for dysuria, flank pain and hematuria.  Musculoskeletal:  Positive for joint pain and myalgias.  Skin:  Negative for rash.  Neurological:  Positive for weakness and headaches.  Psychiatric/Behavioral:  Positive for depression.   All other systems reviewed and are negative.       Past Medical History:  Diagnosis Date   Arthritis     CKD (chronic kidney disease), stage III ([place])      patient denies   Depression     Diabetes mellitus without complication ([place])     Difficult intravenous access     Endometrial cancer ([place])     GERD (gastroesophageal reflux disease)     Headache     History of radiation therapy 11/04/2019-12/01/2019    Endometrial HDR; Dr. Gery Pray   Hypertension     Hypothyroidism     Neuromuscular disorder ([place])      neuropathy in feet   Osteoarthritis     PMB (postmenopausal bleeding)     PONV (postoperative nausea and vomiting)      severe nausea and vomiting after knee replacement 06-2014, did ok with 2018 knee replacement   Urinary frequency     Wears glasses           Past Surgical History:  Procedure Laterality Date   BREAST  SURGERY        cyst removed   CESAREAN SECTION       DILATION AND CURETTAGE OF UTERUS N/A 06/24/2019    Procedure: DILATATION AND CURETTAGE;  Surgeon: Lafonda Mosses, MD;  [facility];  Service: Gynecology;  Laterality: N/A;   FRACTURE SURGERY        right knee   INTRAUTERINE DEVICE (IUD) INSERTION N/A 06/24/2019    Procedure: INTRAUTERINE DEVICE (IUD) INSERTION MIRENA;  Surgeon: Lafonda Mosses, MD;  Location[facility];  Service: Gynecology;  Laterality: N/A;   IRRIGATION AND DEBRIDEMENT  SEBACEOUS CYST       JOINT REPLACEMENT Right      right   LYMPH NODE DISSECTION N/A 09/14/2019    Procedure: LYMPH NODE DISSECTION;  Surgeon: Lafonda Mosses, MD;  Location: [facility];  Service: Gynecology;  Laterality: N/A;   RADIOLOGY WITH ANESTHESIA N/A 11/01/2021    Procedure: ANGIOPLASTY/STENT;  Surgeon: Luanne Bras, MD;  Location: [facility];  Service: Radiology;  Laterality: N/A;   ROBOTIC ASSISTED TOTAL HYSTERECTOMY WITH BILATERAL SALPINGO OOPHERECTOMY Bilateral 09/14/2019    Procedure: XI ROBOTIC ASSISTED TOTAL HYSTERECTOMY WITH BILATERAL SALPINGO OOPHORECTOMY;  Surgeon: Lafonda Mosses, MD;  Location: [facility];  Service: Gynecology;  Laterality: Bilateral;   SENTINEL NODE BIOPSY N/A 09/14/2019    Procedure: SENTINEL NODE BIOPSY;  Surgeon: Lafonda Mosses, MD;  Location: [facility];  Service: Gynecology;  Laterality: N/A;   teeth extration       TONSILLECTOMY       TOTAL HIP ARTHROPLASTY Right 02/23/2015    Procedure: RIGHT TOTAL HIP ARTHROPLASTY ANTERIOR APPROACH;  Surgeon: Leandrew Koyanagi, MD;  Location: [facility];  Service: Orthopedics;  Laterality: Right;   TOTAL KNEE ARTHROPLASTY Left 06/27/2016    Procedure: LEFT TOTAL KNEE ARTHROPLASTY;  Surgeon: Leandrew Koyanagi, MD;  Location: [facility];  Service: Orthopedics;  Laterality: Left;         Family History  Problem Relation Age of Onset   Pancreatic cancer Mother     Stroke Father     Hypertension Father     Colon cancer Neg Hx     Breast cancer Neg Hx     Ovarian cancer Neg Hx     Endometrial cancer Neg Hx     Prostate cancer Neg Hx      Social History:  reports that she quit smoking about 19 years ago. Her smoking use included cigarettes. She has a 67.50 pack-year smoking history. She has never used smokeless tobacco. She reports current alcohol use. She reports that she does not use drugs. Allergies:       Allergies  Allergen Reactions   Anesthetics, Amide Other (See Comments)      Pt is intolerant to general anesthesia. Pt will  throw up and has thrown up during procedure.    Ether Nausea And Vomiting          Medications Prior to Admission  Medication Sig Dispense Refill   ALPRAZolam (XANAX) 0.5 MG tablet Take 0.25-0.5 mg by mouth at bedtime as needed for sleep.       aspirin EC 81 MG tablet Take 81 mg by mouth daily. Swallow whole.       calcium carbonate (OSCAL) 1500 (600 Ca) MG TABS tablet Take 1,500 mg by mouth daily with breakfast.       fenofibrate 160 MG tablet Take 160 mg by mouth daily after breakfast.    2  HUMALOG MIX 75/25 KWIKPEN (75-25) 100 UNIT/ML Kwikpen Inject 30 Units into the skin in the morning and at bedtime.   3   metFORMIN (GLUCOPHAGE) 1000 MG tablet Take 1,000 mg by mouth 2 (two) times daily.    5   telmisartan (MICARDIS) 80 MG tablet Take 80 mg by mouth daily after breakfast.               Home: Home Living Family/patient expects to be discharged to:: Private residence Living Arrangements: Alone Available Help at Discharge: Family, Friend(s), Available PRN/intermittently Type of Home: Apartment Home Access: Level entry Home Layout: One level Bathroom Shower/Tub: Chiropodist: Standard Bathroom Accessibility: Yes Home Equipment: Conservation officer, nature (2 wheels), [employer] - single point, Civil engineer, contracting  Lives With: Alone   Functional History: Prior Function Prior Level of Function : Patient poor historian/Family not available Mobility Comments: pt stating that she ambulates with use of a RW ADLs Comments: requires assistance from her son or an aide to get dressed and bathe. stated that she has an aide 2-3x/wk for "a few hours"   Functional Status:  Mobility: Bed Mobility Overal bed mobility: Needs Assistance Bed Mobility: Supine to Sit, Sit to Supine, Rolling Rolling: Mod assist Supine to sit: Mod assist, +2 for physical assistance Sit to supine: Mod assist, +2 for physical assistance General bed mobility comments: able to roll x 3 for pericare with modA and cues for  using bed rails. modA+2 for supine <>sit for LLE and trunk assist Transfers Overall transfer level: Needs assistance Equipment used: Rolling walker (2 wheels) Transfers: Sit to/from Stand Sit to Stand: Mod assist, +2 physical assistance General transfer comment: Mod of 2 for power up with cues for upright posture and anterior weight shit. Pt with Left/Anterior lean in standing. Cues for keeping hands on walker Ambulation/Gait Ambulation/Gait assistance: Min assist, Mod assist Gait Distance (Feet): 20 Feet (+ 25 + 25 + 20) Assistive device: Rolling walker (2 wheels) Gait Pattern/deviations: Decreased stride length, Narrow base of support, Trunk flexed, Step-to pattern, Decreased step length - right, Decreased step length - left, Shuffle, Scissoring, Decreased stance time - left, Knee flexed in stance - left General Gait Details: pt with progressively narrow BOS and small steps. cues to widen gait and increase stride length, LOB to L needing assist. HR to 120 bpm Gait velocity: decreased Gait velocity interpretation: <1.31 ft/sec, indicative of household ambulator Pre-gait activities: standing marches with BUE support and max cues for posture and positioning   ADL: ADL Overall ADL's : Needs assistance/impaired Eating/Feeding: Set up Grooming: Supervision/safety, Set up, Standing Upper Body Bathing: Supervision/ safety, Set up, Sitting Lower Body Bathing: Moderate assistance, Sit to/from stand Upper Body Dressing : Minimal assistance Lower Body Dressing: Moderate assistance, Sit to/from stand Toilet Transfer: Minimal assistance, Cueing for sequencing, Cueing for safety, Ambulation Toilet Transfer Details (indicate cue type and reason): simulated via functional mobility with RW Toileting- Clothing Manipulation and Hygiene: Maximal assistance (urinary incontinence) Functional mobility during ADLs: Minimal assistance, Cueing for sequencing, Cueing for safety, Rolling walker (2 wheels) General  ADL Comments: emphasis on dynamic standing balance, RW mgmt, and functional mobility.   Cognition: Cognition Overall Cognitive Status: Impaired/Different from baseline Arousal/Alertness: Awake/alert Orientation Level: Oriented to person, Oriented to place, Oriented to situation, Disoriented to time Attention: Sustained Sustained Attention: Impaired Sustained Attention Impairment: Verbal basic Memory: Impaired Memory Impairment: Retrieval deficit, Decreased short term memory Decreased Short Term Memory: Verbal basic, Functional basic Awareness: Impaired Awareness Impairment: Intellectual impairment Problem Solving: Appears  intact Executive Function: Reasoning Reasoning: Impaired Reasoning Impairment: Verbal complex Behaviors: Impulsive Safety/Judgment: Impaired Cognition Arousal/Alertness: Lethargic Behavior During Therapy: Flat affect Overall Cognitive Status: Impaired/Different from baseline Area of Impairment: Orientation, Attention, Memory, Following commands, Safety/judgement, Awareness, Problem solving Orientation Level: Disoriented to, Time Current Attention Level: Sustained Memory: Decreased short-term memory Following Commands: Follows one step commands with increased time Safety/Judgement: Decreased awareness of deficits Awareness: Emergent Problem Solving: Slow processing, Decreased initiation, Difficulty sequencing, Requires verbal cues General Comments: pt unable to state correct day or year. Asking about "the other bride" but oriented to place when asked. increased time and cueing for command following Difficult to assess due to: Impaired communication   Physical Exam: Blood pressure (!) 122/45, pulse 61, temperature 97.6 F (36.4 C), temperature source Oral, resp. rate 14, height '5\' 5"'$  (1.651 m), weight 84.7 kg, SpO2 96 %. Physical Exam Neurological:     Comments: Patient is alert.  Mild dysarthria.  Oriented to self and place.  Follows simple commands.  Limited  overall medical historian.   General: No acute distress Mood and affect are appropriate Heart: Regular rate and rhythm no rubs murmurs or extra sounds Lungs: Clear to auscultation, breathing unlabored, no rales or wheezes Abdomen: Positive bowel sounds, soft nontender to palpation, nondistended Extremities: No clubbing, cyanosis, or edema Skin: No evidence of breakdown, no evidence of rash Neurologic: Cranial nerves II through XII intact, motor strength is 5/5 in RIght and 4- Left deltoid, bicep, tricep, grip,5/5 Right and 2- left  hip flexor, knee extensors, ankle dorsiflexor and plantar flexor Sensory exam normal sensation to light touch and proprioception in bilateral upper and lower extremities  Musculoskeletal: Full range of motion in all 4 extremities. No joint swelling       Lab Results Last 48 Hours        Results for orders placed or performed during the hospital encounter of 10/25/21 (from the past 48 hour(s))  Glucose, capillary     Status: Abnormal    Collection Time: 11/03/21  7:59 AM  Result Value Ref Range    Glucose-Capillary 107 (H) 70 - 99 mg/dL      Comment: Glucose reference range applies only to samples taken after fasting for at least 8 hours.  Glucose, capillary     Status: None    Collection Time: 11/03/21 11:24 AM  Result Value Ref Range    Glucose-Capillary 83 70 - 99 mg/dL      Comment: Glucose reference range applies only to samples taken after fasting for at least 8 hours.  Glucose, capillary     Status: Abnormal    Collection Time: 11/03/21  3:43 PM  Result Value Ref Range    Glucose-Capillary 110 (H) 70 - 99 mg/dL      Comment: Glucose reference range applies only to samples taken after fasting for at least 8 hours.  Glucose, capillary     Status: Abnormal    Collection Time: 11/03/21  9:04 PM  Result Value Ref Range    Glucose-Capillary 189 (H) 70 - 99 mg/dL      Comment: Glucose reference range applies only to samples taken after fasting for at  least 8 hours.  Comprehensive metabolic panel     Status: Abnormal    Collection Time: 11/04/21  2:59 AM  Result Value Ref Range    Sodium 132 (L) 135 - 145 mmol/L    Potassium 4.9 3.5 - 5.1 mmol/L    Chloride 102 98 - 111 mmol/L  CO2 22 22 - 32 mmol/L    Glucose, Bld 221 (H) 70 - 99 mg/dL      Comment: Glucose reference range applies only to samples taken after fasting for at least 8 hours.    BUN 51 (H) 8 - 23 mg/dL    Creatinine, Ser 1.16 (H) 0.44 - 1.00 mg/dL    Calcium 8.7 (L) 8.9 - 10.3 mg/dL    Total Protein 6.2 (L) 6.5 - 8.1 g/dL    Albumin 3.0 (L) 3.5 - 5.0 g/dL    AST 14 (L) 15 - 41 U/L    ALT 11 0 - 44 U/L    Alkaline Phosphatase 55 38 - 126 U/L    Total Bilirubin 0.6 0.3 - 1.2 mg/dL    GFR, Estimated 47 (L) >60 mL/min      Comment: (NOTE) Calculated using the CKD-EPI Creatinine Equation (2021)      Anion gap 8 5 - 15      Comment: Performed at [facility], [place] [address], [place], [place] [address]  Phosphorus     Status: None    Collection Time: 11/04/21  2:59 AM  Result Value Ref Range    Phosphorus 3.9 2.5 - 4.6 mg/dL      Comment: Performed at [facility] [address] [place], [place] [address]  Magnesium     Status: None    Collection Time: 11/04/21  2:59 AM  Result Value Ref Range    Magnesium 2.3 1.7 - 2.4 mg/dL      Comment: Performed at [facility] [address] [place], [place] [address]  CBC with Differential/Platelet     Status: None    Collection Time: 11/04/21  2:59 AM  Result Value Ref Range    WBC 7.4 4.0 - 10.5 K/uL    RBC 4.31 3.87 - 5.11 MIL/uL    Hemoglobin 13.2 12.0 - 15.0 g/dL    HCT 39.1 36.0 - 46.0 %    MCV 90.7 80.0 - 100.0 fL    MCH 30.6 26.0 - 34.0 pg    MCHC 33.8 30.0 - 36.0 g/dL    RDW 14.7 11.5 - 15.5 %    Platelets 165 150 - 400 K/uL      Comment: REPEATED TO VERIFY    nRBC 0.0 0.0 - 0.2 %    Neutrophils Relative % 79 %    Neutro Abs 5.9 1.7 - 7.7 K/uL    Lymphocytes Relative 12 %    Lymphs  Abs 0.9 0.7 - 4.0 K/uL    Monocytes Relative 8 %    Monocytes Absolute 0.6 0.1 - 1.0 K/uL    Eosinophils Relative 0 %    Eosinophils Absolute 0.0 0.0 - 0.5 K/uL    Basophils Relative 0 %    Basophils Absolute 0.0 0.0 - 0.1 K/uL    Immature Granulocytes 1 %    Abs Immature Granulocytes 0.06 0.00 - 0.07 K/uL      Comment: Performed at [facility] [address] [place], [place] [address]  Glucose, capillary     Status: Abnormal    Collection Time: 11/04/21  8:17 AM  Result Value Ref Range    Glucose-Capillary 156 (H) 70 - 99 mg/dL      Comment: Glucose reference range applies only to samples taken after fasting for at least 8 hours.    Comment 1 Notify RN      Comment 2 Document in Chart  Glucose, capillary     Status: Abnormal    Collection Time: 11/04/21 12:09 PM  Result Value Ref Range    Glucose-Capillary 138 (H) 70 - 99 mg/dL      Comment: Glucose reference range applies only to samples taken after fasting for at least 8 hours.    Comment 1 Notify RN      Comment 2 Document in Chart    Glucose, capillary     Status: Abnormal    Collection Time: 11/04/21  5:39 PM  Result Value Ref Range    Glucose-Capillary 222 (H) 70 - 99 mg/dL      Comment: Glucose reference range applies only to samples taken after fasting for at least 8 hours.  Glucose, capillary     Status: Abnormal    Collection Time: 11/04/21  9:22 PM  Result Value Ref Range    Glucose-Capillary 193 (H) 70 - 99 mg/dL      Comment: Glucose reference range applies only to samples taken after fasting for at least 8 hours.  Glucose, capillary     Status: Abnormal    Collection Time: 11/04/21  9:40 PM  Result Value Ref Range    Glucose-Capillary 198 (H) 70 - 99 mg/dL      Comment: Glucose reference range applies only to samples taken after fasting for at least 8 hours.  CBC with Differential/Platelet     Status: None    Collection Time: 11/05/21  2:35 AM  Result Value Ref Range    WBC 9.1 4.0 - 10.5 K/uL     RBC 4.17 3.87 - 5.11 MIL/uL    Hemoglobin 12.7 12.0 - 15.0 g/dL    HCT 38.1 36.0 - 46.0 %    MCV 91.4 80.0 - 100.0 fL    MCH 30.5 26.0 - 34.0 pg    MCHC 33.3 30.0 - 36.0 g/dL    RDW 14.6 11.5 - 15.5 %    Platelets 167 150 - 400 K/uL    nRBC 0.0 0.0 - 0.2 %    Neutrophils Relative % 82 %    Neutro Abs 7.5 1.7 - 7.7 K/uL    Lymphocytes Relative 10 %    Lymphs Abs 0.9 0.7 - 4.0 K/uL    Monocytes Relative 7 %    Monocytes Absolute 0.6 0.1 - 1.0 K/uL    Eosinophils Relative 0 %    Eosinophils Absolute 0.0 0.0 - 0.5 K/uL    Basophils Relative 0 %    Basophils Absolute 0.0 0.0 - 0.1 K/uL    Immature Granulocytes 1 %    Abs Immature Granulocytes 0.06 0.00 - 0.07 K/uL      Comment: Performed at [facility], [address] [place], [place] [address]  Comprehensive metabolic panel     Status: Abnormal    Collection Time: 11/05/21  2:35 AM  Result Value Ref Range    Sodium 133 (L) 135 - 145 mmol/L    Potassium 5.0 3.5 - 5.1 mmol/L    Chloride 104 98 - 111 mmol/L    CO2 21 (L) 22 - 32 mmol/L    Glucose, Bld 206 (H) 70 - 99 mg/dL      Comment: Glucose reference range applies only to samples taken after fasting for at least 8 hours.    BUN 52 (H) 8 - 23 mg/dL    Creatinine, Ser 1.45 (H) 0.44 - 1.00 mg/dL    Calcium 8.7 (L) 8.9 - 10.3 mg/dL    Total  Protein 5.7 (L) 6.5 - 8.1 g/dL    Albumin 2.8 (L) 3.5 - 5.0 g/dL    AST 13 (L) 15 - 41 U/L    ALT 10 0 - 44 U/L    Alkaline Phosphatase 56 38 - 126 U/L    Total Bilirubin 0.8 0.3 - 1.2 mg/dL    GFR, Estimated 36 (L) >60 mL/min      Comment: (NOTE) Calculated using the CKD-EPI Creatinine Equation (2021)      Anion gap 8 5 - 15      Comment: Performed at [facility] [address], [place], [place] [address]  Phosphorus     Status: None    Collection Time: 11/05/21  2:35 AM  Result Value Ref Range    Phosphorus 3.6 2.5 - 4.6 mg/dL      Comment: Performed at [facility] [address] [place], [place] [address]   Magnesium     Status: None    Collection Time: 11/05/21  2:35 AM  Result Value Ref Range    Magnesium 2.1 1.7 - 2.4 mg/dL      Comment: Performed at [facility] [address], [place], [place] [address]      Imaging Results (Last 48 hours)  No results found.         Blood pressure (!) 122/45, pulse 61, temperature 97.6 F (36.4 C), temperature source Oral, resp. rate 14, height '5\' 5"'$  (1.651 m), weight 84.7 kg, SpO2 96 %.   Medical Problem List and Plan: 1. 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  Antithrombotics: -DVT/anticoagulation: Lovenox             -antiplatelet therapy: Aspirin 81 mg daily and Plavix 75 mg day x3 to 6 months 3. Pain Management: Tylenol as needed 4. Mood/Behavior/Sleep: Xanax 0.25-0.5 mg nightly as needed             -antipsychotic agents: N/A 5. Neuropsych/cognition: This patient is capable of making decisions on her own behalf. 6. Skin/Wound Care: Routine skin checks 7. Fluids/Electrolytes/Nutrition: Routine in and outs with follow-up chemistries 8.  Left frontal large meningioma.  Follow-up neurosurgery Dr. Ellene Route.  Plan for possible surgical resection 8-12 weeks.  Continue Decadron therapy. 9.  Hypertension.  Norvasc 10 mg daily, Avapro 75 mg daily.  Monitor with increased mobility 10.  Diabetes mellitus.  Hemoglobin A1c 7.0.  Semglee 32 units daily.  Monitor while on Decadron therapy 11.  Hyperlipidemia.  Fenofibrate/Crestor 12.  CKD stage III.  Latest creatinine 1.45.  Baseline 1.22-1.69.  Follow-up chemistries 13.  History of endometrial cancer.  Follow-up outpatient 14.  GERD.  Protonix   Lavon Paganini Angiulli, PA-C 11/05/2021  "I have personally performed a face to face diagnostic evaluation of this patient.  Additionally, I have reviewed and  concur with the physician assistant's documentation above." Charlett Blake M.D. [facility] Fellow [employer] of [employer] and Re[employer] Diplomate Am Board of Electrodiagnostic Med Fellow Am Board of Interventional Pain

## 2021-11-05 NOTE — Progress Notes (Signed)
Occupational Therapy Treatment Patient Details Name: Cassandra Dodson MRN: [id number] DOB: 09-06-1940 Today's Date: 11/05/2021   History of present illness Pt is an 81 y/o female admitted 7/6 with slurred speech and L sided weakness. 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 PMH including but not limited to HTN, DM, CKD3, GERD and endometrial cancer.   OT comments  [name] is making limited progress and needing more assistance this session when compared to previous OT treatment session. Overall she required min A to get to the EOB and up to mod A for sitting balance due to poor midline awareness and L lateral leaning. She tolerated standing 3x with min A +2 and pivoted to the chair with RW and mod A +2 with BLE buckling. Pt noted to have slow processing, poor awareness and problem solving throughout needing simple one step repetitive cues. She continues to benefit from OT acutely. D/c remains appropriate.    Recommendations for follow up therapy are one component of a multi-disciplinary discharge planning process, led by the attending physician.  Recommendations may be updated based on patient status, additional functional criteria and insurance authorization.    Follow Up Recommendations  Acute inpatient rehab (3hours/day)    Assistance Recommended at Discharge Frequent or constant Supervision/Assistance  Patient can return home with the following  A little help with walking and/or transfers;A lot of help with bathing/dressing/bathroom;Assistance with cooking/housework;Direct supervision/assist for medications management;Direct supervision/assist for financial management;Assist for  transportation;Help with stairs or ramp for entrance   Equipment Recommendations  BSC/3in1    Recommendations for Other Services Rehab consult    Precautions / Restrictions Precautions Precautions: Fall Precaution Comments: urinary incontinence Restrictions Weight Bearing Restrictions: No       Mobility Bed Mobility Overal bed mobility: Needs Assistance Bed Mobility: Supine to Sit     Supine to sit: Min assist     General bed mobility comments: able to complete movement of LE with minA, increased time, and max cues, then sequence movement of trunk with minA and increased time, min-modA to pad to scoot to EOB and minA to steady in sitting as pt falling L upon transition to sitting    Transfers Overall transfer level: Needs assistance Equipment used: Rolling walker (2 wheels) Transfers: Sit to/from Stand, Bed to chair/wheelchair/BSC Sit to Stand: Mod assist, +2 physical assistance     Step pivot transfers: Max assist, +2 physical assistance     General transfer comment: modA of 2 to power up to standing, max cues for use of BUE on RW handles, unable to standing march without knees buckling, maxA to maintain upright with small pivotal steps to recliner. unabl eto advance RLE, L knee buckling often     Balance Overall balance assessment: Needs assistance Sitting-balance support: Feet supported, Bilateral upper extremity supported Sitting balance-Leahy Scale: Poor Sitting balance - Comments: left lateral lean with cues for finding midline, pt at times overcorrecting with cues Postural control: Left lateral lean Standing balance support: Bilateral upper extremity supported Standing balance-Leahy Scale: Poor Standing balance comment: dependent on BUE support and maxA from therapists  ADL either performed or assessed with clinical judgement   ADL Overall ADL's : Needs assistance/impaired Eating/Feeding: Supervision/ safety;Set  up;Sitting Eating/Feeding Details (indicate cue type and reason): thickened liquids                     Toilet Transfer: Moderate assistance;+2 for physical assistance;+2 for safety/equipment;Stand-pivot;Rolling walker (2 wheels);Cueing for safety;Cueing for sequencing Toilet Transfer Details (indicate cue type and reason): step by step cues; BLE buckling.         Functional mobility during ADLs: Moderate assistance;+2 for physical assistance;+2 for safety/equipment General ADL Comments: focued on midline posture in sitting and standing. RW mgmt. functional mobility    Extremity/Trunk Assessment Upper Extremity Assessment Upper Extremity Assessment: LUE deficits/detail LUE Deficits / Details: edema in LUE, placed on elevated surface at end of session, limited ROM and increased cues to attend to position. LUE Sensation: decreased light touch;decreased proprioception LUE Coordination: decreased fine motor;decreased gross motor   Lower Extremity Assessment Lower Extremity Assessment: Defer to PT evaluation        Vision   Vision Assessment?: Vision impaired- to be further tested in functional context Additional Comments: overall WFL for tracking, no DV, reports some blurry vision but pt wears glasses at baseline   Perception Perception Perception: Impaired   Praxis Praxis Praxis: Impaired Praxis Impairment Details: Motor planning    Cognition Arousal/Alertness: Lethargic Behavior During Therapy: Flat affect Overall Cognitive Status: Impaired/Different from baseline Area of Impairment: Orientation, Attention, Memory, Following commands, Safety/judgement, Awareness, Problem solving                 Orientation Level: Disoriented to, Time Current Attention Level: Sustained Memory: Decreased short-term memory Following Commands: Follows one step commands with increased time, Follows one step commands inconsistently Safety/Judgement: Decreased awareness of  deficits, Decreased awareness of safety Awareness: Intellectual Problem Solving: Slow processing, Decreased initiation, Difficulty sequencing, Requires verbal cues General Comments: delayed processing and needs increased cues to follow simple commands. When asked, pt denied wearing glasses but pt's son corrected her saying she does. Pt unable to identify her glasses        Exercises      Shoulder Instructions       General Comments VSS on RA, son present at the beginnning and end of session    Pertinent Vitals/ Pain       Pain Assessment Pain Assessment: No/denies pain  Home Living                                          Prior Functioning/Environment              Frequency  Min 2X/week        Progress Toward Goals  OT Goals(current goals can now be found in the care plan section)  Progress towards OT goals: Progressing toward goals  Acute Rehab OT Goals Patient Stated Goal: to finish breakfast OT Goal Formulation: With patient Time For Goal Achievement: 11/09/21 Potential to Achieve Goals: Good ADL Goals Pt Will Perform Grooming: with modified independence Pt Will Perform Lower Body Bathing: with supervision Pt Will Perform Lower Body Dressing: with supervision Pt Will Transfer to Toilet: with supervision;ambulating Additional ADL Goal #1: Pt will complete 2 step ADL task with minimal redirectional cues in minimally distracting environment  Plan Discharge plan remains appropriate;Frequency remains appropriate    Co-evaluation    PT/OT/SLP Co-Evaluation/Treatment:  Yes Reason for Co-Treatment: Complexity of the patient's impairments (multi-system involvement);For patient/therapist safety;To address functional/ADL transfers PT goals addressed during session: Mobility/safety with mobility;Balance;Proper use of DME OT goals addressed during session: ADL's and self-care      AM-PAC OT "6 Clicks" Daily Activity     Outcome Measure   Help  from another person eating meals?: A Little Help from another person taking care of personal grooming?: A Little Help from another person toileting, which includes using toliet, bedpan, or urinal?: A Lot Help from another person bathing (including washing, rinsing, drying)?: A Lot Help from another person to put on and taking off regular upper body clothing?: A Little Help from another person to put on and taking off regular lower body clothing?: A Lot 6 Click Score: 15    End of Session Equipment Utilized During Treatment: Gait belt;Rolling walker (2 wheels)  OT Visit Diagnosis: Unsteadiness on feet (R26.81);Other abnormalities of gait and mobility (R26.89);Muscle weakness (generalized) (M62.81);Other symptoms and signs involving cognitive function;Other symptoms and signs involving the nervous system (R29.898);Hemiplegia and hemiparesis Hemiplegia - Right/Left: Left Hemiplegia - dominant/non-dominant: Non-Dominant Hemiplegia - caused by: Cerebral infarction   Activity Tolerance Patient tolerated treatment well   Patient Left in chair;with call bell/phone within reach;with chair alarm set   Nurse Communication Mobility status        Time: 6144-3154 OT Time Calculation (min): 23 min  Charges: OT General Charges $OT Visit: 1 Visit OT Treatments $Self Care/Home Management : 8-22 mins    Bartow Zylstra A Stephanne Greeley 11/05/2021, 9:58 AM

## 2021-11-05 NOTE — Progress Notes (Signed)
STROKE TEAM PROGRESS NOTE   INTERVAL HISTORY Her son is at the bedside. Patient is awake and alert laying in the bed in NAD.  , Right INO improved. Able to cross midline.  Moderate dysarthria. Left droop.  Left  arm drifts, left leg drift immediately to bed, minimal movement. Neurological exam unchanged and VSS.  Patient will be transferred to inpatient rehab today.  Vitals:   11/04/21 2320 11/04/21 2330 11/05/21 0315 11/05/21 0729  BP: (!) 175/68 128/60 (!) 122/45 (!) 102/54  Pulse: 71 72 61 (!) 58  Resp: '13 13 14 20  '$ Temp: 98 F (36.7 C)  97.6 F (36.4 C) 97.9 F (36.6 C)  TempSrc: Oral  Oral Axillary  SpO2: 96% 98% 96% 97%  Weight:      Height:       CBC:  Recent Labs  Lab 11/04/21 0259 11/05/21 0235  WBC 7.4 9.1  NEUTROABS 5.9 7.5  HGB 13.2 12.7  HCT 39.1 38.1  MCV 90.7 91.4  PLT 165 259   Basic Metabolic Panel:  Recent Labs  Lab 11/04/21 0259 11/05/21 0235  NA 132* 133*  K 4.9 5.0  CL 102 104  CO2 22 21*  GLUCOSE 221* 206*  BUN 51* 52*  CREATININE 1.16* 1.45*  CALCIUM 8.7* 8.7*  MG 2.3 2.1  PHOS 3.9 3.6   Lipid Panel:  Recent Labs  Lab 11/02/21 0528  TRIG 147   HgbA1c:  No results for input(s): "HGBA1C" in the last 168 hours.  Urine Drug Screen: No results for input(s): "LABOPIA", "COCAINSCRNUR", "LABBENZ", "AMPHETMU", "THCU", "LABBARB" in the last 168 hours.  Alcohol Level  No results for input(s): "ETH" in the last 168 hours.   IMAGING past 24 hours No results found.  PHYSICAL EXAM General:  Alert, well-nourished, well-developed pleasant elderly Caucasian lady t in no acute distress Respiratory:  Regular, unlabored respirations  NEURO:  She is oriented to self, place. No aphasia, mild dysarthria. Left facial droop. Follows commands, can name objects. Right INO improved. nystagmus on right gaze. Right upper 4/5, Left side weakness. left arm 3/5, Left leg minimal movement.    ASSESSMENT/PLAN [name] is a 81 y.o. female with  history of  HTN, DM, CKD3, GERD and endometrial cancer  presenting with slurred speech for two days and left leg weakness for one day.  Patient's son states that she first seemed to be having problems on 7/4 when she had some difficulty with ambulation, then developed slurred speech and dragging of the left leg while walking.  [place] shows a left paracentral meningioma with vasogenic edema, and MRI brain shows a right paramedian pontine stroke. Patient denies any prior known history of meningioma or brain problems.  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 X 3-6 months then Plavix alone  She will need outpatient neurology follow up after  discharge in 8 weeks with [facility] Therapy recommendations:  CIR Disposition:  pending  Basilar artery stenosis CTA head & neck marked stenosis of BA proximal to SCAs Per son, pt baseline walks with walker due to left hip and b/l knee replacement but lives independently PTA Now with mild left facial droop and left UE drift post stroke Given high risk of recurrent stroke with devastating outcome, basilar artery stent assisted angioplasty on 7/13. Off heparin IV, on DAPT  Left frontal large meningioma CT head 5.0 cm intracranial mass along anterior falx, likely to be a meningioma with vasogenic  edema and 2 cm localized midline shift MRI left anterior cranial fossa meningioma with vasogenic edema and MLS Neurosurgery on board Plan for possible surgical resection in 8-12 weeks first On decardron  Hypertension Home meds:  telmisartan 80 mg daily Goal 120-150. After 24 hr of IR BP goal < 180 Cleviprex drip off Avapro '75mg'$  Norvasc '10mg'$   Long-term BP goal normotensive  Hyperlipidemia Home meds:  fenofibrate 160 mg daily, resumed in hospital LDL 133, goal < 70 Continue rosuvastatin 20 mg daily  Continue statin at discharge  Diabetes type II Uncontrolled Home meds:  metformin 1000 mg BID HgbA1c 7.0, goal < 7.0 CBGs SSI On insulin now Hyperglycemia especially on decardron now, improved Close PCP follow up for better DM control in the setting of steroid use  Dysphagia Speech on board Now on nectar thick liquid Aspiration precaution  AKI on CKD 3a Cre 1.3->1.43->1.22->1.43->1.69->1.09->1.16 Avoid dehydration  Other Stroke Risk Factors Advanced Age >/= 94  Former cigarette smoker  Other Active Problems Hx of left hip and b/l Knee replacement   Neurology will sign off. Please call with questions or concerns.   Hospital day # 10   Continue mobilization out of bed and ongoing therapy consults.  Continue dual antiplatelet therapy x 3 to 6 months and then Plavix alone..  Transfer to rehab when bed available today..  Discussed with son at the bedside and answered questions.  Discussed with Dr. Zollie Scale.  Stroke team will sign off.  Follow-up with outpatient stroke clinic in 8 weeks.  Greater than 50% time during this 35-minute visit were spent in counseling and coordination of care about her stroke and discussion with care team and answered questions.  Antony Contras, MD Medical Director [facility] Pager: [phone] 11/05/2021 11:55 AM   To contact Stroke Continuity provider, please refer to [email or web address] After hours, contact General Neurology

## 2021-11-05 NOTE — Progress Notes (Signed)
Physical Therapy Treatment Patient Details Name: Cassandra Dodson MRN: [id number] DOB: [birth year] Today's Date: 11/05/2021   History of Present Illness Pt is an 81 y/o female admitted 7/6 with slurred speech and L sided weakness. 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 PMH including but not limited to HTN, DM, CKD3, GERD and endometrial cancer.    PT Comments    The pt was agreeable to session with focus on continued progression of OOB mobility. The pt was able to complete bed mobility with only minA, increased time, and max cues. The pt did require modA of 2 to safely rise from EOB, and up to maxA of 2 to attempt pivotal transfer to recliner. She was unable to manage wt bearing on LLE and had frequent buckling with attempts to advance RLE. Increased time, effort, cues, and assist were all needed to manage transfer to recliner at this time, will continue to benefit from skilled PT acutely as well as acute inpatient rehab when medically safe to d/c.    Recommendations for follow up therapy are one component of a multi-disciplinary discharge planning process, led by the attending physician.  Recommendations may be updated based on patient status, additional functional criteria and insurance authorization.  Follow Up Recommendations  Acute inpatient rehab (3hours/day)     Assistance Recommended at Discharge Frequent or constant Supervision/Assistance  Patient can return home with the following Assistance with cooking/housework;Direct supervision/assist for medications management;Direct supervision/assist for financial management;Assist for transportation;Help with stairs or ramp  for entrance;A lot of help with walking and/or transfers;A lot of help with bathing/dressing/bathroom   Equipment Recommendations  None recommended by PT    Recommendations for Other Services       Precautions / Restrictions Precautions Precautions: Fall Precaution Comments: urinary incontinence Restrictions Weight Bearing Restrictions: No     Mobility  Bed Mobility Overal bed mobility: Needs Assistance Bed Mobility: Supine to Sit     Supine to sit: Min assist     General bed mobility comments: able to complete movement of LE with minA, increased time, and max cues, then sequence movement of trunk with minA and increased time, min-modA to pad to scoot to EOB and minA to steady in sitting as pt falling L upon transition to sitting    Transfers Overall transfer level: Needs assistance Equipment used: Rolling walker (2 wheels) Transfers: Sit to/from Stand, Bed to chair/wheelchair/BSC Sit to Stand: Mod assist, +2 physical assistance   Step pivot transfers: Max assist, +2 physical assistance       General transfer comment: modA of 2 to power up to standing, max cues for use of BUE on RW handles, unable to standing march without knees buckling, maxA to maintain upright with small pivotal steps to recliner. unabl eto advance RLE, L knee buckling often    Ambulation/Gait             Pre-gait activities: attempted standing marches, L knee buckling  with stance General Gait Details: pt unable to manage more than 1-2 steps with maxA of 2 from EOB     Modified Rankin (Stroke Patients Only) Modified Rankin (Stroke Patients Only) Pre-Morbid Rankin Score: Moderately severe disability Modified Rankin: Severe disability     Balance  Overall balance assessment: Needs assistance Sitting-balance support: Feet supported, Bilateral upper extremity supported Sitting balance-Leahy Scale: Poor Sitting balance - Comments: left lateral lean with cues for finding midline, pt at  times overcorrecting with cues Postural control: Left lateral lean Standing balance support: Bilateral upper extremity supported Standing balance-Leahy Scale: Poor Standing balance comment: dependent on BUE support and maxA from therapists                            Cognition Arousal/Alertness: Lethargic Behavior During Therapy: Flat affect Overall Cognitive Status: Impaired/Different from baseline Area of Impairment: Orientation, Attention, Memory, Following commands, Safety/judgement, Awareness, Problem solving                 Orientation Level: Disoriented to, Time Current Attention Level: Sustained Memory: Decreased short-term memory Following Commands: Follows one step commands with increased time, Follows one step commands inconsistently Safety/Judgement: Decreased awareness of deficits, Decreased awareness of safety Awareness: Intellectual Problem Solving: Slow processing, Decreased initiation, Difficulty sequencing, Requires verbal cues General Comments: pt needing increased cues and time to complete movements such as bed mobility and positioning at EOB, but not able to follow cues/commands with stepping with attempts at gait. poor awareness of deficits. At times difficult to understand        Exercises      General Comments General comments (skin integrity, edema, etc.): VSS on RA      Pertinent Vitals/Pain Pain Assessment Pain Assessment: No/denies pain     PT Goals (current goals can now be found in the care plan section) Acute Rehab PT Goals Patient Stated Goal: to go home PT Goal Formulation: With patient/family Time For Goal Achievement: 11/16/21 Potential to Achieve Goals: Good Progress towards PT goals: Progressing toward goals    Frequency    Min 4X/week      PT Plan Current plan remains appropriate    Co-evaluation PT/OT/SLP Co-Evaluation/Treatment: Yes Reason for Co-Treatment: Necessary to address cognition/behavior during  functional activity;For patient/therapist safety;To address functional/ADL transfers PT goals addressed during session: Mobility/safety with mobility;Balance;Proper use of DME        AM-PAC PT "6 Clicks" Mobility   Outcome Measure  Help needed turning from your back to your side while in a flat bed without using bedrails?: A Lot Help needed moving from lying on your back to sitting on the side of a flat bed without using bedrails?: A Lot Help needed moving to and from a bed to a chair (including a wheelchair)?: Total Help needed standing up from a chair using your arms (e.g., wheelchair or bedside chair)?: A Lot Help needed to walk in hospital room?: Total Help needed climbing 3-5 steps with a railing? : Total 6 Click Score: 9    End of Session Equipment Utilized During Treatment: Gait belt Activity Tolerance: Patient tolerated treatment well Patient left: in chair;with call bell/phone within reach;with chair alarm set;with family/visitor present Nurse Communication: Mobility status (stedy back to bed) PT Visit Diagnosis: Other abnormalities of gait and mobility (R26.89);Other symptoms and signs involving the nervous system (F16.384)     Time: 6659-9357 PT Time Calculation (min) (ACUTE ONLY): 23 min  Charges:  $Therapeutic Activity: 8-22 mins                     [place] Carbo, PT, DPT   Acute Rehabilitation Department   Sandra Cockayne 11/05/2021, 9:32 AM

## 2021-11-05 NOTE — Progress Notes (Signed)
Inpatient Rehabilitation Admission Medication Review by a Pharmacist  A complete drug regimen review was completed for this patient to identify any potential clinically significant medication issues.  High Risk Drug Classes Is patient taking? Indication by Medication  Antipsychotic No   Anticoagulant Yes Lovenox- VTE prophylaxis  Antibiotic No   Opioid No   Antiplatelet Yes Aspirin, plavix- CVA prophylaxis  Hypoglycemics/insulin Yes Insulin- T2DM  Vasoactive Medication Yes Avapro, norvasc- HTN  Chemotherapy No   Other Yes Xanax- anxiety Fenofibrate- hypertriglyceridemia Crestor- HLD Protonix- GERD     Type of Medication Issue Identified Description of Issue Recommendation(s)  Drug Interaction(s) (clinically significant)     Duplicate Therapy     Allergy     No Medication Administration End Date  Aspirin Neuro wants DAPT for 3-6 months. Will follow-up in the OP arena  Incorrect Dose     Additional Drug Therapy Needed     Significant med changes from prior encounter (inform family/care partners about these prior to discharge).    Other  PTA meds: Xanax Aspirin Oscal Fenofibrate Humalog mix Glucophage micardis Restart PTA meds when and if necessary during CIR admission or at time of discharge, if warranted     Clinically significant medication issues were identified that warrant physician communication and completion of prescribed/recommended actions by midnight of the next day:  No   Time spent performing this drug regimen review (minutes):  30  Cassandra Dodson BS, PharmD, BCPS Clinical Pharmacist 11/05/2021 3:42 PM  Contact: [phone] after 3 PM  "Be curious, not judgmental..." -Jamal Maes

## 2021-11-05 NOTE — Progress Notes (Signed)
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 Patient is on dual antiplatelet agents. No new symptoms.   Allergies: Anesthetics, amide and Ether  Medications: Prior to Admission medications   Medication Sig Start Date End Date Taking? Authorizing Provider  ALPRAZolam Duanne Moron) 0.5 MG tablet Take 0.25-0.5 mg by mouth at bedtime as needed for sleep. 09/21/21  Yes [provider]  aspirin EC 81 MG tablet Take 81 mg by mouth daily. Swallow whole.   Yes [provider]  calcium carbonate (OSCAL) 1500 (600 Ca) MG TABS tablet Take 1,500 mg by mouth daily with breakfast.   Yes [provider]  fenofibrate 160 MG tablet Take 160 mg by mouth daily after breakfast.  05/12/16  Yes [provider]  HUMALOG MIX 75/25 KWIKPEN (75-25) 100 UNIT/ML Kwikpen Inject 30 Units into the skin in the morning and at bedtime. 06/08/16  Yes [provider]  metFORMIN (GLUCOPHAGE) 1000 MG tablet Take 1,000 mg by mouth 2 (two) times daily.  01/21/15  Yes [provider]  telmisartan (MICARDIS) 80 MG tablet Take 80 mg by mouth daily after breakfast.  06/01/19  Yes [provider]     Vital Signs: BP (!) 102/54 (BP Location: Left Arm)   Pulse (!) 58   Temp 97.9 F (36.6 C) (Axillary)   Resp 20   Ht '5\' 5"'$  (1.651 m)   Wt 84.7 kg   SpO2 97%   BMI 31.07 kg/m   Physical examination: Alert and oriented x3 Difficulty hearing There is mild weakness seen in the left UE-3/5 and LEs-3/5 Reminder of the extremities are wnl.  Sensory function is normal.  There is moderate bruising seen in the Rt forearm.   Labs:  CBC: Recent Labs    11/01/21 1818 11/01/21 1845 11/02/21 0528 11/04/21 0259 11/05/21 0235  WBC 8.7  --  10.9* 7.4 9.1  HGB 13.1 12.9 13.1 13.2 12.7  HCT 39.4 38.0 40.0 39.1 38.1  PLT 136*  --  178 165 167    COAGS: Recent Labs     10/25/21 1540 10/30/21 0449 11/01/21 1127  INR 1.1 1.2 1.2  APTT 25  --   --     BMP: Recent Labs    11/01/21 0309 11/01/21 1845 11/02/21 0528 11/04/21 0259 11/05/21 0235  NA 134* 139 138 132* 133*  K 4.9 4.5 4.3 4.9 5.0  CL 99  --  103 102 104  CO2 25  --  22 22 21*  GLUCOSE 265*  --  175* 221* 206*  BUN 55*  --  45* 51* 52*  CALCIUM 9.1  --  8.2* 8.7* 8.7*  CREATININE 1.69*  --  1.09* 1.16* 1.45*  GFRNONAA 30*  --  51* 47* 36*    LIVER FUNCTION TESTS: Recent Labs    11/01/21 0309 11/02/21 0528 11/04/21 0259 11/05/21 0235  BILITOT 0.4 0.3 0.6 0.8  AST 12* 13* 14* 13*  ALT '10 10 11 10  '$ ALKPHOS 59 49 55 56  PROT 5.8* 5.6* 6.2* 5.7*  ALBUMIN 2.8* 2.7* 3.0* 2.8*    Assessment and Plan:  Patient with basilar artery stent placement on 11/01/2021. No new symptoms. Continue with DAP, PT. Transfer to rehab when bed available.  Electronically Signed: Glori Bickers, MD 11/05/2021, 12:07 PM   I spent a total of 15 Minutes at the the patient's bedside AND on the patient's Dodson  floor or unit, greater than 50% of which was counseling/coordinating care about her stroke and discussion with care team and answered questions.

## 2021-11-05 NOTE — Progress Notes (Signed)
PMR Admission Coordinator Pre-Admission Assessment   Patient: Cassandra Dodson is an 81 y.o., female MRN: [id number] DOB: [birth year] Height: 5' 5"  (165.1 cm) Weight: 76.7 kg   Insurance Information HMO: yes    PPO:      PCP:      IPA:      80/20:      OTHER:  PRIMARY: U[employer] Medicare      Policy#: [id number]      Subscriber: Pt  CM Name:       Phone#: [phone]    Fax#: [phone] Pt. Approved for CIR on 10/28/21 for admit 7/10 for 7 days with update due 8/93 Pre-Cert#: [id number]                Employer:  Benefits:  Phone #:      Name:  Cassandra Dodson Date: 05/23/2021 - still active Deductible: $0 (does not have deductible) OOP Max: $3,600 ($20 met) CIR: $295/day co-pay for days 1-5, $0/day co-pay for days 6+ SNF: $0.00 Copayment per day for days 1-20; $196.00 Copayment per day for days 21-39; $0.00 Copayment per day for days 40-100 for Medicare-covered care/maximum 100 days/benefit period Outpatient: $20/visit co-pay/visit Home Health:  100% coverage; limited by medical necessity DME: 80% coverage; 20% co-insurance Providers: in network  SECONDARY:       Policy#:      Phone#:    Development worker, community:       Phone#:    The Engineer, petroleum" for patients in Inpatient Rehabilitation Facilities with attached "Privacy Act [place] Records" was provided and verbally reviewed with: Patient   Emergency Contact Information Contact Information       Name Relation Home Work Mobile    [place] Son [phone]   [phone]    Dodson,Cassandra Relative [phone]   [phone]           Current Medical History  Patient Admitting Diagnosis: CVA, Brain Mass History of Present Illness:  Cassandra Dodson is an 81 year old right-handed female history of endometrial cancer, hypertension, type 2 diabetes mellitus, hypothyroidism, CKD stage III, quit smoking 19 years ago, history of left hip and bilateral knee replacement.  Per chart review patient lives alone.  1 level apartment.   She does have a son in the area.  Used a rolling walker prior to admission.  She does have an aide 2-3 days a week.  Presented 10/25/2021 to [facility] with acute onset of slurred speech and left-sided weakness x2 days.  CT/MRI showed large acute right paramedian pontine infarction.  Anterior cranial fossa meningioma with large amount of surrounding vasogenic edema in the inferior left frontal lobe.  Rightward midline shift with subfalcine herniation of the left cingulate gyrus.  CT angiogram head and neck showed no large vessel occlusion.  Plaque at the ICA origin causing less than 50% stenosis.  Marked stenosis of BA proximal to SCA's.  Given high risk of recurrent stroke with devastating outcome patient underwent basilar artery stenting assisted angioplasty 7/13 per interventional radiology.  Initially placed on intravenous heparin since transition to aspirin 81 mg daily and Plavix 75 mg daily x3 to 6 months.  Neurosurgery follow-up in regards to left frontal large meningioma Dr. Ellene Dodson plan for possible surgical resection 8-12 weeks.  Patient was placed on Decadron therapy.  She was cleared to begin Lovenox for DVT prophylaxis 11/02/2021.  Blood pressure monitored initially maintained on Cleviprex.  She is currently on a dysphagia #3 nectar thick liquid diet.  Therapy evaluations completed due to  patient decreased functional mobility was admitted for a comprehensive rehab program.     Complete NIHSS TOTAL: 2   Patient's medical record from [facility] has been reviewed by the rehabilitation admission coordinator and physician.   Past Medical History      Past Medical History:  Diagnosis Date   Arthritis     CKD (chronic kidney disease), stage III ([place])      patient denies   Depression     Diabetes mellitus without complication ([place])     Difficult intravenous access     Endometrial cancer ([place])     GERD (gastroesophageal reflux disease)     Headache     History of radiation  therapy 11/04/2019-12/01/2019    Endometrial HDR; Dr. Gery Dodson   Hypertension     Hypothyroidism     Neuromuscular disorder ([place])      neuropathy in feet   Osteoarthritis     PMB (postmenopausal bleeding)     PONV (postoperative nausea and vomiting)      severe nausea and vomiting after knee replacement 06-2014, did ok with 2018 knee replacement   Urinary frequency     Wears glasses        Has the patient had major surgery during 100 days prior to admission? No   Family History   family history includes Hypertension in her father; Pancreatic cancer in her mother; Stroke in her father.   Current Medications   Current Facility-Administered Medications:    ALPRAZolam (XANAX) tablet 0.25-0.5 mg, 0.25-0.5 mg, Oral, QHS PRN, Eugenie Filler, MD, 0.5 mg at 10/27/21 2108   aspirin EC tablet 81 mg, 81 mg, Oral, Daily, Eugenie Filler, MD, 81 mg at 10/28/21 0956   calcium carbonate (OS-CAL - dosed in mg of elemental calcium) tablet 1,250 mg, 1,250 mg, Oral, Q breakfast, Eugenie Filler, MD, 1,250 mg at 10/28/21 0954   cefTRIAXone (ROCEPHIN) 2 g in sodium chloride 0.9 % 100 mL IVPB, 2 g, Intravenous, Q24H, Eugenie Filler, MD, Last Rate: 200 mL/hr at 10/28/21 1005, 2 g at 10/28/21 1005   clopidogrel (PLAVIX) tablet 75 mg, 75 mg, Oral, Daily, Rosalin Hawking, MD, 75 mg at 10/28/21 0957   dexamethasone (DECADRON) injection 4 mg, 4 mg, Intravenous, Q6H, Hall, Carole N, DO, 4 mg at 10/28/21 0511   fenofibrate tablet 160 mg, 160 mg, Oral, QPC breakfast, Eugenie Filler, MD, 160 mg at 25/49/82 6415   folic acid (FOLVITE) tablet 1 mg, 1 mg, Oral, Daily, Eugenie Filler, MD, 1 mg at 10/28/21 0956   hydrALAZINE (APRESOLINE) injection 10 mg, 10 mg, Intravenous, Q6H PRN, Eugenie Filler, MD, 10 mg at 10/26/21 0810   insulin aspart (novoLOG) injection 0-15 Units, 0-15 Units, Subcutaneous, TID WC, Eugenie Filler, MD, 8 Units at 10/28/21 0959   insulin aspart (novoLOG) injection 0-5  Units, 0-5 Units, Subcutaneous, QHS, Eugenie Filler, MD, 5 Units at 10/27/21 2114   insulin glargine-yfgn (SEMGLEE) injection 34 Units, 34 Units, Subcutaneous, Daily, Eugenie Filler, MD, 34 Units at 10/28/21 1022   irbesartan (AVAPRO) tablet 75 mg, 75 mg, Oral, Daily, Eugenie Filler, MD, 75 mg at 10/28/21 0956   pantoprazole (PROTONIX) EC tablet 40 mg, 40 mg, Oral, Daily, Hall, Carole N, DO, 40 mg at 10/28/21 0956   rosuvastatin (CRESTOR) tablet 20 mg, 20 mg, Oral, Daily, Rosalin Hawking, MD, 20 mg at 10/28/21 8309   Patients Current Diet:  Diet Order  Diet NPO time specified Except for: Sips with Meds  Diet effective ____             DIET DYS 3 Room service appropriate? Yes; Fluid consistency: Nectar Thick  Diet effective now                         Precautions / Restrictions Precautions Precautions: Fall Precaution Comments: urinary incontenance Restrictions Weight Bearing Restrictions: No    Has the patient had 2 or more falls or a fall with injury in the past year? Yes   Prior Activity Level Community (5-7x/wk): Pt. was active in the community PTA   Prior Functional Level Self Care: Did the patient need help bathing, dressing, using the toilet or eating? Independent   Indoor Mobility: Did the patient need assistance with walking from room to room (with or without device)? Independent   Stairs: Did the patient need assistance with internal or external stairs (with or without device)? Independent   Functional Cognition: Did the patient need help planning regular tasks such as shopping or remembering to take medications? Independent   Patient Information Are you of Hispanic, Latino/a,or Spanish origin?: A. No, not of Hispanic, Latino/a, or Spanish origin What is your race?: A. White Do you need or want an interpreter to communicate with a doctor or health care staff?: 0. No   Patient's Response To:  Health Literacy and Transportation Is the  patient able to respond to health literacy and transportation needs?: Yes Health Literacy - How often do you need to have someone help you when you read instructions, pamphlets, or other written material from your doctor or pharmacy?: Never In the past 12 months, has lack of transportation kept you from medical appointments or from getting medications?: Yes In the past 12 months, has lack of transportation kept you from meetings, work, or from getting things needed for daily living?: No   Home Assistive Devices / Equipment Home Equipment: Conservation officer, nature (2 wheels), [employer] - single point, Civil engineer, contracting   Prior Device Use: Indicate devices/aids used by the patient prior to current illness, exacerbation or injury? None of the above   Current Functional Level Cognition   Arousal/Alertness: Awake/alert Overall Cognitive Status: History of cognitive impairments - at baseline Difficult to assess due to: Impaired communication Current Attention Level: Sustained Orientation Level: Oriented X4 Following Commands: Follows one step commands with increased time, Follows one step commands inconsistently Safety/Judgement: Decreased awareness of deficits General Comments: increased time and cues to complete instructions, at times pt needing repeated cues, max cues for safety and hand placement Attention: Sustained Sustained Attention: Impaired Sustained Attention Impairment: Verbal basic Memory: Impaired Memory Impairment: Retrieval deficit, Decreased short term memory Decreased Short Term Memory: Verbal basic, Functional basic Awareness: Impaired Awareness Impairment: Intellectual impairment Problem Solving: Appears intact Executive Function: Reasoning Reasoning: Impaired Reasoning Impairment: Verbal complex Behaviors: Impulsive Safety/Judgment: Impaired    Extremity Assessment (includes Sensation/Coordination)   Upper Extremity Assessment: LUE deficits/detail LUE Deficits / Details: Using LUE for  functional tasks, however note dropping objects she distracted;clumsy use of hand; unaware L hand not holding RW at times LUE Sensation: decreased light touch, decreased proprioception LUE Coordination: decreased fine motor, decreased gross motor  Lower Extremity Assessment: Defer to PT evaluation LLE Deficits / Details: pt grossly 3/5 throughout but very difficult to accurately assess secondary to cognition     ADLs   Overall ADL's : Needs assistance/impaired Eating/Feeding: Set up Grooming: Supervision/safety, Set up, Standing  Upper Body Bathing: Supervision/ safety, Set up, Sitting Lower Body Bathing: Moderate assistance, Sit to/from stand Upper Body Dressing : Minimal assistance Lower Body Dressing: Moderate assistance, Sit to/from stand Toilet Transfer: Minimal assistance, Ambulation, Rolling walker (2 wheels) (poor use of RW-  unaware L hand falling off RW at times; pushing RW about 2 ft in front of her) Toileting- Clothing Manipulation and Hygiene: Maximal assistance (urinary incontinence) Functional mobility during ADLs: Minimal assistance, Cueing for safety, Cueing for sequencing, Rolling walker (2 wheels)     Mobility   Overal bed mobility: Needs Assistance Bed Mobility: Supine to Sit Supine to sit: HOB elevated, Mod assist Sit to supine: Supervision General bed mobility comments: modA to complete with pt having difficulty scooting to EOB or elevating trunk     Transfers   Overall transfer level: Needs assistance Equipment used: Rolling walker (2 wheels) Transfers: Sit to/from Stand Sit to Stand: Min assist, Mod assist General transfer comment: increased time and effort with strong posterior lean, needing modA initially but improved to minA with reps. repeated cues for hand positioning     Ambulation / Gait / Stairs / Wheelchair Mobility   Ambulation/Gait Ambulation/Gait assistance: Mod assist, Min assist Gait Distance (Feet): 5 Feet (+ 5 ft) Assistive device: Rolling  walker (2 wheels) Gait Pattern/deviations: Decreased stride length, Narrow base of support, Trunk flexed General Gait Details: pt initially unable to complete static marches despite modA of 1, needing frequent seated rest break. after x3 attempts, pt was able to complete lateral steps to recliner with minA and minA to manage RW. then minA to manage forwards steps from recliner with chair follow Gait velocity: decreased Pre-gait activities: standing marches with BUE support and max cues for posture and positioning     Posture / Balance Balance Overall balance assessment: Needs assistance Sitting-balance support: Feet supported Sitting balance-Leahy Scale: Fair Standing balance support: During functional activity, Bilateral upper extremity supported, Single extremity supported Standing balance-Leahy Scale: Poor Standing balance comment: dependent on BUE support and up to [place] needs/care consideration Skin Ecchymosis/redness on BUEs and neck, Redness on Coccyx    Previous Home Environment (from acute therapy documentation) Living Arrangements: Alone  Lives With: Alone Available Help at Discharge: Family, Friend(s), Available PRN/intermittently Type of Home: Apartment Home Layout: One level Home Access: Level entry Bathroom Shower/Tub: Chiropodist: Standard Bathroom Accessibility: Yes How Accessible: Accessible via walker   Discharge Living Setting Plans for Discharge Living Setting: Patient's home Type of Home at Discharge: House Discharge Home Layout: One level Discharge Home Access: Level entry Discharge Bathroom Shower/Tub: Tub/shower unit Discharge Bathroom Toilet: Standard Discharge Bathroom Accessibility: Yes How Accessible: Accessible via walker   Social/Family/Support Systems Patient Roles: Other (Comment) Contact Information: [name] Anticipated Caregiver: [phone] Ability/Limitations of Caregiver: Can provide min A Caregiver  Availability: 24/7 Discharge Plan Discussed with Primary Caregiver: Yes Is Caregiver In Agreement with Plan?: Yes   Goals Patient/Family Goal for Rehab: PT/OT/SLP Supervision to Min A Expected length of stay: 14-16 days Pt/Family Agrees to Admission and willing to participate: Yes Program Orientation Provided & Reviewed with Pt/Caregiver Including Roles  & Responsibilities: Yes   Decrease burden of Care through IP rehab admission: n/a           Possible need for SNF placement upon discharge: not anticipated   Patient Condition: I have reviewed medical records from [facility], spoken with CM, and patient and son. I met with patient at the bedside for  inpatient rehabilitation assessment.  Patient will benefit from ongoing PT, OT, and SLP, can actively participate in 3 hours of therapy a day 5 days of the week, and can make measurable gains during the admission.  Patient will also benefit from the coordinated team approach during an Inpatient Acute Rehabilitation admission.  The patient will receive intensive therapy as well as Rehabilitation physician, nursing, social worker, and care management interventions.  Due to safety, skin/wound care, disease management, medication administration, pain management, and patient education the patient requires 24 hour a day rehabilitation nursing.  The patient is currently Min A  with mobility and basic ADLs.  Discharge setting and therapy post discharge at home with home health is anticipated.  Patient has agreed to participate in the Acute Inpatient Rehabilitation Program and will admit today.   Preadmission Screen Completed By:  Genella Mech, 10/28/2021 10:33 AM ______________________________________________________________________   Discussed status with Dr. Letta Pate  on 11/05/21 at 75 and received approval for admission today.   Admission Coordinator:  Genella Mech, CCC-SLP, time 1011/Date 11/05/21    Assessment/Plan: Diagnosis:  right paramedian pontine infarct, left frontal meningioma Does the need for close, 24 hr/day Medical supervision in concert with the patient's rehab needs make it unreasonable for this patient to be served in a less intensive setting? Yes Co-Morbidities requiring supervision/potential complications: endometrial ca, htn, dm, ckd 3b Due to bladder management, bowel management, safety, skin/wound care, disease management, medication administration, pain management, and patient education, does the patient require 24 hr/day rehab nursing? Yes Does the patient require coordinated care of a physician, rehab nurse, PT, OT, and SLP to address physical and functional deficits in the context of the above medical diagnosis(es)? Yes Addressing deficits in the following areas: balance, endurance, locomotion, strength, transferring, bowel/bladder control, bathing, dressing, feeding, grooming, toileting, cognition, speech, swallowing, and psychosocial support Can the patient actively participate in an intensive therapy program of at least 3 hrs of therapy 5 days a week? Yes The potential for patient to make measurable gains while on inpatient rehab is excellent Anticipated functional outcomes upon discharge from inpatient rehab: supervision PT, supervision OT, supervision SLP Estimated rehab length of stay to reach the above functional goals is: 7-10 days Anticipated discharge destination: Home 10. Overall Rehab/Functional Prognosis: excellent     MD Signature:

## 2021-11-05 NOTE — Discharge Summary (Signed)
Physician Discharge Summary   Patient: Cassandra Dodson MRN: [id number] DOB: 11/04/1940  Admit date:     10/25/2021  Discharge date: 11/05/21  Discharge Physician: Raiford Noble, DO   PCP: Shon Baton, MD   Recommendations at discharge:   Follow up with PCP within 1-2 weeks and repeat CBC, CMP, Mag, Phos within 1 week Follow up with Neurology in 8 weeks and C/w DAPT for 3-6 months and then just Plavix Alone Follow-up with neurosurgery Dr. Ellene Route in the outpatient setting in 1 to 2 weeks Follow up with IR for further evaluation and continue dual antiplatelet therapy as above and monitoring of hematoma closely Follow-up with Gynecology Oncology Dr. Berline Lopes in the outpatient setting in 1 to 2 weeks  Discharge Diagnoses: Principal Problem:   Brain mass Active Problems:   CVA (cerebral vascular accident) ([place])   Stage 3b chronic kidney disease ([place])   Thrombocytopenia ([place])   History of endometrial cancer   UTI (urinary tract infection)   Intracranial mass   Hyperlipidemia   Type 2 diabetes mellitus with stage 3b chronic kidney disease, without long-term current use of insulin ([place])   Basilar artery stenosis   Occlusion and stenosis of basilar artery   Pressure injury of skin  Resolved Problems:   * No resolved hospital problems. [facility] Course: HPI per Dr. Irene Pap on 10/25/21  Cassandra Dodson is a 81 y.o. female with medical history significant for endometrial cancer, essential hypertension, type 2 diabetes, GERD, hypothyroidism, CKD 3B who initially presented to [facility] ED with complaints of slurred speech, left-sided weakness x2 days.  Associated with mild confusion.  No headache.  Noncontrast head CT done in the ED revealed 5.0 cm long axis intracranial mass highly likely to be a meningioma associated with 2.0 cm localized left to right midline shift with extensive vasogenic edema in the left frontal lobe.  EDP discussed the case with neurosurgery Dr. Ellene Route who recommended MRI  brain with and without contrast and to add IV Decadron for the vasogenic edema.   Upon assessment at [facility], the patient is alert, follows commands, denies any headaches.  ED Course: Tmax 98.6.  BP 170/93, pulse 68, respiratory rate 29, saturation 96% on room air.  Lab studies significant for glucose 211, BUN 20, creatinine 1.09 GFR 51.  CBC essentially unremarkable except for platelet count of 146.  **Interim History   Neurology and both neurosurgery are following and neurosurgery recommending holding off any consideration of surgical intervention until she recovers from her stenting procedure and they feel that they may be just able to simply observe the meningioma.  Neurology evaluated given her acute CVA in the right paramedian pontine area with severe stenosis of the basilar artery and she is status post endovascular revascularization of the severely stenotic distal basilar artery with stent assisted angioplasty and subsequently she had to be mechanically vented and was left vented and was transferred to the intensive care unit.  Patient also developed a right arm hematoma just proximal to the procedure puncture site and a postprocedural hematoma protocol was followed.  She was placed on PCCM service but now been transferred back to the medicine service on 11/04/2021.  Neurology continues to recommend aspirin 81 mg p.o. daily as well as clopidogrel 75 mg for least 3 to 6 months due to recent stenting and therapy recommendations for CIR.  SLP evaluated continue recommending dysphagia 3 diet with nectar thick liquids  Neurosurgery reevaluated and recommends that she will need a  little bit more time to recover from her recent CVA before they consider any surgical interventions for her frontal meningioma and will follow-up again on 11/05/2021.  PT/OT recommending CIR and the rehab admissions coordinator is following and she can be discharged to CIR and follow-up with neurology within a week,  neurosurgery in 1 to 2 weeks, as well as interventional radiology in the outpatient setting given that she is medically stable  Assessment and Plan:  Intracranial mass, seen on CT likely a meningioma -Patient presented with left-sided weakness and slurred speech. -Noncontrast CT done in the ED with 5.0 cm long axis left paracentral intracranial mass along the planum sphenoidale and anterior falx, highly likely to be a meningioma associated with 2.0 cm of localized left right midline shift and with extensive vasogenic edema in the left frontal lobe.  Frontal horn of the left lateral ventricle partially effaced.  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 -MRI brain done as recommended per neurosurgery with large acute right paramedian pontine infarct, anterior cranial fossa meningioma with large amount of surrounding vasogenic edema in the anterior left frontal lobe.  Rightward midline shift with subfalcine herniation of the left cingulate gyrus. -Neurosurgery consulted who recommended IV Decadron 4 mg every 6 hours and now recommending to continue IV Decadron 4 mg every 12 hours x2 to 3 days and then 2 mg every 12 hours x2 to 3 days then subsequently discontinue per discussion with neurosurgery, Dr. Annette Stable. -Neurosurgery also recommending conservative treatment at this time allowing patient to recover from stroke before considering major surgical intervention to debulk and remove left frontal meningioma. -Continue GI prophylaxis with PPI.   -Was on IV fluid but is now stopped; See below -Per last neurosurgical note there we will hold off on any consideration of surgical intervention until patient recovers from stenting the procedure and they feel that she is little bit more time to recover from her recent stroke before to consider any surgical interventions for her frontal meningioma and will be seeing the patient again on 11/05/2021  Large acute right paramedian pontine infarct in the setting of severe  stenosis of the basilar arteries -Noted on MRI brain. -Stroke work-up underway and stroke pathway ordered. -CT angiogram head and neck ordered with no LVO, multivessel multifocal stenosis.  Focal marked stenosis of the basilar proximal to superior cerebellar artery origins. 2D echo ordered. -Patient was on aspirin prior to admission. -Permissive hypertension -Neurology consulted and feels stroke appears to be due to large vessel disease, also noted that patient seemed to have bradyphrenia and executive dysfunction which could be due to edema on the left frontal lobe. -2D echo with EF of 60 to 65%, NWMA, moderate asymmetric LVH of the basal septal, no embolic source noted. -TSH within normal limits at 0.993, folate decreased at 5.8, vitamin B12 at 287. -LDL of 133.  Patient is on a statin of Rosuvastatin 20 mg p.o. daily -EEG done and showed "This EEG was obtained while awake and asleep and is abnormal due to mild diffuse slowing indicative of global cerebral dysfunction and superimposed focal slowing over the left frontal region in the area of patient's known meningioma. Epileptiform abnormalities were not seen during this recording." -Continue statin with goal LDL < 70. -Patient currently on aspirin and Plavix for dual antiplatelet treatment per neurology and they are recommending for at least 3 to 6 months now given that she got a stent recommendations. -Patient being followed by PT/OT/SLP and they are recommending CIR once ready for discharge and  she is stable to be discharged -Per neurology and IR   Basilar Artery Stenosis -CT angiogram head and neck with marked stenosis of basilar artery proximal to SCA's. -Being followed by neurology who have consulted IR for further evaluation. -Patient seen in consultation by IR, Dr. Estanislado Pandy on 10/28/2021 and cerebral angiogram with angioplasty/possible stent placement within the basilar artery plan tentatively for Tuesday, 10/30/2021 under general  anesthesia however has been rescheduled to Thursday, 11/01/2021.   -Per IR no need to hold aspirin and Plavix. -She is status post basilar artery stenosis stenting assisted angioplasty on 11/01/2021 and on dual antiplatelet therapy now; she unfortunately developed a hematoma proximal to the site of puncture it was about 10 to 12 cm in diameter and firm and mildly tender with palpation: IR followed the postprocedural hematoma protocol and packed with heat every few hours and if they feel it is enlarging negative consider further imaging and vascular surgery consultation -Follow Hematoma at CIR   Right arm hematoma -See above -Management per IR   AKI on CKD stage IIIb Metabolic Acidosis -Stable. -Patient's BUNs/creatinine has gone from 45/1.22 -> 48/1.43 -> 55/1.69 -> 45/1.09 -> 51/1.61 -> 52/1.45 -IV fluid hydration now stopped  -Avoid further nephrotoxic medications, contrast dyes, hypotension and dehydration and will need to renally adjust medications -Continue monitor and trend and repeat CMP in a.m. at CIR  Acute thrombocytopenia -Patient with no overt bleeding but now has a hematoma as above -Thrombocytopenia had resolved but is now back again and platelet count has gone from 156 -> 137 -> 148 -> 178 -> 165 -Continue to monitor and trend and repeat CBC in CIR  History of Endometrial Cancer -Patient follows with OB/GYN oncology, Dr. Berline Lopes in the outpatient setting. -Outpatient follow-up.   Hyponatremia -Mild. Na+ went from 135 -> 134 and trended up to 138 but is slightly dropped to 132 and is now 133 -Started IVF as above -Continue to Monitor and Trend and repeat CMP in the AM   Diabetes mellitus type 2 -Hemoglobin A1c 7.0 (10/26/2021) -Continue to hold oral hypoglycemic agents. -Elevated CBGs secondary to steroids. -CBGs ranging from 138-222 -Continue Semglee to 38 units daily. -Monitor CBGs with steroid taper. -Continue with moderate NovoLog/scale insulin before meals and at  bedtime and may need to adjust further insulin regimen   Hypoalbuminemia -Patient's albumin level is now 2.8 -Continue monitor and trend and repeat CMP in CIR   Hyperlipidemia -LDL of 33, goal LDL < 70 -Continue fenofibrate 160 mg p.o. daily and rosuvastatin 20 mg p.o. daily  Hypertension -Continue Irbesartan 150 mg daily but may need to hold given slightly worsened Renal Fxn but will re-evaluate in the AM   -Permissive hypertension secondary to acute CVA. -Avoid low blood pressure. -Continue to Monitor BP per Protocol; Last BP reading was on the softer side at 102/54    E. coli UTI -Urinalysis concerning for UTI. -Urine cultures > 100,000 colonies of E. coli pansensitive.   -Continued IV Rocephin to treat for total of 5 days as patient was to undergo cerebral angiogram with possible angioplasty versus stent placement.   -Patient has completed antibiotics at this time now   Obesity -Complicates overall prognosis and care -Estimated body mass index is 31.07 kg/m as calculated from the following:   Height as of this encounter: '5\' 5"'$  (1.651 m).   Weight as of this encounter: 84.7 kg.  -Weight Loss and Dietary Counseling given  Active Pressure Injury/Wound(s)     Pressure Ulcer  Duration  Pressure Injury 11/01/21 Sacrum Mid Stage 1 -  Intact skin with non-blanchable redness of a localized area usually over a bony prominence. 3 days           Consultants:  Neurology Neurosurgery PCCM/pulmonary Interventional radiology   Procedures performed:  CT head 10/25/2021 CT angiogram head and neck 10/26/2021  MRI brain 10/25/2021 2D echo 10/26/2021 EEG 10/26/2021 status post endovascular revascularization of the severely stenotic distal basilar artery with stent assisted angioplasty    Disposition: Rehabilitation facility Diet recommendation:  Dysphagia type 3 Nectar Liquid  DISCHARGE MEDICATION: Allergies as of 11/05/2021       Reactions   Anesthetics, Amide Other (See  Comments)   Pt is intolerant to general anesthesia. Pt will throw up and has thrown up during procedure.    Ether Nausea And Vomiting        Medication List     STOP taking these medications    metFORMIN 1000 MG tablet Commonly known as: GLUCOPHAGE   telmisartan 80 MG tablet Commonly known as: MICARDIS       TAKE these medications    acetaminophen 325 MG tablet Commonly known as: TYLENOL Take 2 tablets (650 mg total) by mouth every 4 (four) hours as needed for mild pain (or temp > 37.5 C (99.5 F)).   ALPRAZolam 0.5 MG tablet Commonly known as: XANAX Take 0.25-0.5 mg by mouth at bedtime as needed for sleep.   amLODipine 10 MG tablet Commonly known as: NORVASC Take 1 tablet (10 mg total) by mouth daily. Start taking on: November 06, 2021   aspirin EC 81 MG tablet Take 81 mg by mouth daily. Swallow whole.   calcium carbonate 1250 (500 Ca) MG tablet Commonly known as: OS-CAL - dosed in mg of elemental calcium Take 1 tablet (1,250 mg total) by mouth daily with breakfast. Start taking on: November 06, 2021 What changed:  medication strength how much to take   clopidogrel 75 MG tablet Commonly known as: PLAVIX Take 1 tablet (75 mg total) by mouth daily. Start taking on: November 06, 2021   dexamethasone 4 MG/ML injection Commonly known as: DECADRON Inject 1 mL (4 mg total) into the vein every 12 (twelve) hours for 3 days, THEN 0.5 mLs (2 mg total) every 12 (twelve) hours for 3 days. Start taking on: November 05, 2021   docusate sodium 100 MG capsule Commonly known as: COLACE Take 1 capsule (100 mg total) by mouth 2 (two) times daily.   fenofibrate 160 MG tablet Take 160 mg by mouth daily after breakfast.   folic acid 1 MG tablet Commonly known as: FOLVITE Take 1 tablet (1 mg total) by mouth daily. Start taking on: November 06, 2021   HumaLOG Mix 75/25 KwikPen (75-25) 100 UNIT/ML Kwikpen Generic drug: Insulin Lispro Prot & Lispro Inject 30 Units into the skin in the morning  and at bedtime.   irbesartan 75 MG tablet Commonly known as: AVAPRO Take 1 tablet (75 mg total) by mouth daily. Start taking on: November 06, 2021   mouth rinse Liqd solution 15 mLs by Mouth Rinse route as needed (for oral care).   pantoprazole 40 MG tablet Commonly known as: PROTONIX Take 1 tablet (40 mg total) by mouth daily. Start taking on: November 06, 2021   polyethylene glycol 17 g packet Commonly known as: MIRALAX / GLYCOLAX Take 17 g by mouth daily. Start taking on: November 06, 2021   rosuvastatin 20 MG tablet Commonly known as: CRESTOR Take 1 tablet (20 mg  total) by mouth daily. Start taking on: November 06, 2021               Discharge Care Instructions  (From admission, onward)           Start     Ordered   11/05/21 0000  Discharge wound care:       Comments: Turning and Mepilex   11/05/21 1232           Discharge Exam: Filed Weights   10/25/21 1531 11/01/21 1042 11/03/21 2318  Weight: 76.7 kg 76.7 kg 84.7 kg   Vitals:   11/05/21 0315 11/05/21 0729  BP: (!) 122/45 (!) 102/54  Pulse: 61 (!) 58  Resp: 14 20  Temp: 97.6 F (36.4 C) 97.9 F (36.6 C)  SpO2: 96% 97%   Examination: Physical Exam:  Constitutional: WN/WD obese elderly Caucasian female currently no acute distress sitting in the chair Respiratory: Diminished to auscultation bilaterally, no wheezing, rales, rhonchi or crackles. Normal respiratory effort and patient is not tachypenic. No accessory muscle use.  Unlabored breathing Cardiovascular: RRR, no murmurs / rubs / gallops. S1 and S2 auscultated. No extremity edema.  Abdomen: Soft, non-tender, distended second by habitus bowel sounds positive.  GU: Deferred. Musculoskeletal: No clubbing / cyanosis of digits/nails. No joint deformity upper and lower extremities.  Skin: Significant bruising noted and ecchymosis in the right upper extremity Neurologic: CN 2-12 grossly intact but she has a left-sided facial droop and dysarthria  still. Psychiatric: Normal judgment and insight. Alert and oriented x 2. Normal mood and appropriate affect.   Condition at discharge: stable  The results of significant diagnostics from this hospitalization (including imaging, microbiology, ancillary and laboratory) are listed below for reference.   Imaging Studies: IR PTA Intracranial  Result Date: 11/05/2021 CLINICAL DATA:  Patient with symptomatic severe distal basilar artery atherosclerotic related stenosis. Diffuse bilateral vertebrobasilar and right posterior cerebral artery atherosclerotic narrowing. EXAM: PTA INTRACRANIAL COMPARISON:  Recent CT angiogram of the head and neck. MEDICATIONS: Heparin 3,000 units IV. Ancef 2 g IV antibiotic was administered within 1 hour of the procedure. ANESTHESIA/SEDATION: General anesthesia. CONTRAST:  Omnipaque 300 approximately 80 mL. FLUOROSCOPY TIME:  Fluoroscopy Time: 42 minutes 24 seconds (1765 mGy). COMPLICATIONS: None immediate. TECHNIQUE: Informed written consent was obtained from the son after a thorough discussion of the procedural risks, benefits and alternatives. All questions were addressed. Maximal Sterile Barrier Technique was utilized including caps, mask, sterile gowns, sterile gloves, sterile drape, hand hygiene and skin antiseptic. A timeout was performed prior to the initiation of the procedure. The right forearm to the wrist was prepped and draped in the usual sterile manner. The right radial artery was identified with ultrasound, and its morphology documented permanently in the radiology PACS system. A dorsal palmar anastomosis was verified to be present. Using ultrasound guidance, access into the right radial artery was obtained using a micropuncture set over a 0.018 inch micro guidewire. A 7 French sheath was then inserted without difficulty. The obturator, and micro guidewire were removed. Good aspiration obtained from the side port of the radial sheath. A cocktail of 2000 units of  heparin, 2.5 mg of verapamil, and 200 mcg of nitroglycerin was then infused in diluted form through the sheath without event. A right radial arteriogram was then performed. Over a 0.035 inch Roadrunner guidewire, a combination of a 7 [place] 95 cm sheath with a 5.5 French 25 cm Rist support Simmons's 2 catheter was advanced to the proximal right subclavian artery, and access  obtained into the right vertebral artery using biplane roadmap technique and constant fluoroscopic guidance. The combination of the Rist guide catheter with the support Simmons 2 catheter was advanced to the distal right vertebral artery. The support catheter and the 035 inch Roadrunner guidewire was removed. Brisk aspiration obtained from the hub of the wrist guide sheath. A gentle control arteriogram performed through the Rist sheath demonstrated no evidence of spasms, dissections or of intraluminal filling defects. Good angiographic outlet was obtained of the right vertebrobasilar junction and the posterior circulation. FINDINGS: The dominant right vertebral artery origin demonstrates wide patency, with moderate tortuosity proximally. More distally, the right vertebrobasilar junction demonstrates mild atherosclerotic narrowing just proximal to the origin of the right posterior-inferior cerebellar artery. A mild-to-moderate stenosis is also evident of the mid basilar artery just distal to the origin of the anterior-inferior cerebellar arteries. Distal to this there is a severe tapered stenosis of the distal basilar artery proximal to the origins of the superior cerebellar arteries. The right posterior cerebral artery demonstrates approximately 50-60% stenosis in its P1 segment and the P2 segment proximally. There is prompt retrograde opacification into the left vertebrobasilar junction to the left posterior-inferior cerebellar artery. This also unmasks a severe stenosis of the distal left vertebrobasilar junction. ENDOVASCULAR REVASCULARIZATION  OF SYMPTOMATIC HIGH-GRADE STENOSIS OF THE DISTAL BASILAR ARTERY Through the 7 [place] Rist guide catheter in the distal right vertebral artery a 5 French 115 cm Catalyst guide catheter was then advanced over a 0.035 inch Roadrunner guidewire and positioned in the distal right vertebrobasilar junction. The guidewire was removed. Good aspiration obtained from the hub of the Catalyst guide catheter. A gentle control arteriogram performed through this demonstrated no evidence of spasms, dissections or of intraluminal filling defects. The distal posterior circulation remained stable. Over an 014 inch Softip 300 cm exchange BMW micro guidewire, an 027 microcatheter was then advanced to the distal end of the 5 [place] guide catheter in the right vertebrobasilar junction. Using a torque device, the micro guidewire with the microcatheter was gently advanced to the distal basilar artery and advanced through the stenotic distal basilar artery into the right posterior cerebral artery distal P2 P3 junction. At this time, the microcatheter was removed. Gentle control arteriogram performed through the 5 French catheter advanced in the mid basilar artery demonstrated no evidence of dissections or of intraluminal filling defects. Measurements were then performed of the distal basilar artery, and also of the mid basilar artery including the length of the severely stenotic segment. It was decided to use a 2.25 mm x 12 mm balloon mounted drug-eluting stent. This was retrogradely purged with heparinized saline infusion, and antegradely with 50% contrast and 50% heparinized saline infusion. Using the rapid exchange technique, the stent delivery system was then advanced without difficulty and positioned such that the distal marker was just proximal to the origins of the superior cerebellar arteries. The balloon mounted stent was then deployed in the usual manner using micro inflation syringe device via micro tubing inflating to approximally  2.2 mm in a controlled manner, where it was maintained for approximately 20 seconds. The balloon was deflated and retrieved proximally. A control arteriogram performed through the 5 [place] Catalyst guide catheter in the proximal basilar artery now demonstrated significantly improved caliber and flow and apposition of the stent across the previously noted stenosis. Brisk flow was noted into the right posterior cerebral artery and the superior cerebellar arteries. At this time, the balloon was retrieved and removed. Control arteriograms were then performed  at approximally 10 and 20 minutes post deployment of the stent. These continued to demonstrate excellent apposition of the stent with significantly improved caliber and flow through the stented segment. Brisk flow was noted into the posterior circulation distally. A final control arteriogram performed through the Rist guide catheter in the proximal right vertebral artery continued to demonstrate excellent flow through the vertebral artery proximally and distally. The stented segment of the distal basilar artery demonstrated wide patency with a less than a 10% residual stenosis. Flow continued to be maintained in the right posterior cerebral arteries, superior cerebellar arteries and the anterior-inferior cerebellar arteries including the posterior-inferior cerebellar artery bilaterally. No acute hemodynamic changes were seen in the periprocedural and in the immediate postprocedural course. The Rist guide catheter was retrieved and removed. The 7 French radial sheath was removed. Hemostasis at the right radial puncture site was achieved with a wrist band. Distal right radial pulse was verified to be present. An immediate CT of the brain demonstrated no evidence of intracranial hemorrhage. At this time diffuse swelling was noted in the forearms to the wrist bilaterally including the lower extremities. An ultrasound performed over the right radial puncture site  revealed a hematoma which responded to compression. Because of the paucity of venous access in the upper extremities and lower extremities via of the edema, anesthesia service went ahead and obtained a right internal jugular vein access. It was decided to keep the patient intubated to protect the airway. Patient was then transferred to the neuro ICU for post procedural management. Critical care consultation was also requested, ventilator management, and management of generalized extremity edema. 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 PLAN: Follow-up in the clinic 2 weeks post discharge. Electronically Signed   By: Luanne Bras M.D.   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 Diffuse bilateral vertebrobasilar and right posterior cerebral artery atherosclerotic narrowing. EXAM: PTA INTRACRANIAL COMPARISON:  Recent CT angiogram of the head and neck. MEDICATIONS: Heparin 3,000 units IV. Ancef 2 g IV antibiotic was administered within 1 hour of the procedure. ANESTHESIA/SEDATION: General anesthesia. CONTRAST:  Omnipaque 300 approximately 80 mL. FLUOROSCOPY TIME:  Fluoroscopy Time: 42 minutes 24 seconds (1765 mGy). COMPLICATIONS: None immediate. TECHNIQUE: Informed written consent was obtained from the son after a thorough discussion of the procedural risks, benefits and alternatives. All questions were addressed. Maximal Sterile Barrier Technique was utilized including caps, mask, sterile gowns, sterile gloves, sterile drape, hand hygiene and skin antiseptic. A timeout was performed prior to the initiation of the procedure. The right forearm to the wrist was prepped and draped in  the usual sterile manner. The right radial artery was identified with ultrasound, and its morphology documented permanently in the radiology PACS system. A dorsal palmar anastomosis was verified to be present. Using ultrasound guidance, access into the right radial artery was obtained using a micropuncture set over a 0.018 inch micro guidewire. A 7 French sheath was then inserted without difficulty. The obturator, and micro guidewire were removed. Good aspiration obtained from the side port of the radial sheath. A cocktail of 2000 units of heparin, 2.5 mg of verapamil, and 200 mcg of nitroglycerin was then infused in diluted form through the sheath without event. A right  radial arteriogram was then performed. Over a 0.035 inch Roadrunner guidewire, a combination of a 7 [place] 95 cm sheath with a 5.5 [place] 25 cm Rist support Simmons's 2 catheter was advanced to the proximal right subclavian artery, and access obtained into the right vertebral artery using biplane roadmap technique and constant fluoroscopic guidance. The combination of the Rist guide catheter with the support Simmons 2 catheter was advanced to the distal right vertebral artery. The support catheter and the 035 inch Roadrunner guidewire was removed. Brisk aspiration obtained from the hub of the wrist guide sheath. A gentle control arteriogram performed through the Rist sheath demonstrated no evidence of spasms, dissections or of intraluminal filling defects. Good angiographic outlet was obtained of the right vertebrobasilar junction and the posterior circulation. FINDINGS: The dominant right vertebral artery origin demonstrates wide patency, with moderate tortuosity proximally. More distally, the right vertebrobasilar junction demonstrates mild atherosclerotic narrowing just proximal to the origin of the right posterior-inferior cerebellar artery. A mild-to-moderate stenosis is also evident of the mid basilar artery just distal to the origin of the  anterior-inferior cerebellar arteries. Distal to this there is a severe tapered stenosis of the distal basilar artery proximal to the origins of the superior cerebellar arteries. The right posterior cerebral artery demonstrates approximately 50-60% stenosis in its P1 segment and the P2 segment proximally. There is prompt retrograde opacification into the left vertebrobasilar junction to the left posterior-inferior cerebellar artery. This also unmasks a severe stenosis of the distal left vertebrobasilar junction. ENDOVASCULAR REVASCULARIZATION OF SYMPTOMATIC HIGH-GRADE STENOSIS OF THE DISTAL BASILAR ARTERY Through the 7 [place] Rist guide catheter in the distal right vertebral artery a 5 French 115 cm Catalyst guide catheter was then advanced over a 0.035 inch Roadrunner guidewire and positioned in the distal right vertebrobasilar junction. The guidewire was removed. Good aspiration obtained from the hub of the Catalyst guide catheter. A gentle control arteriogram performed through this demonstrated no evidence of spasms, dissections or of intraluminal filling defects. The distal posterior circulation remained stable. Over an 014 inch Softip 300 cm exchange BMW micro guidewire, an 027 microcatheter was then advanced to the distal end of the 5 [place] guide catheter in the right vertebrobasilar junction. Using a torque device, the micro guidewire with the microcatheter was gently advanced to the distal basilar artery and advanced through the stenotic distal basilar artery into the right posterior cerebral artery distal P2 P3 junction. At this time, the microcatheter was removed. Gentle control arteriogram performed through the 5 French catheter advanced in the mid basilar artery demonstrated no evidence of dissections or of intraluminal filling defects. Measurements were then performed of the distal basilar artery, and also of the mid basilar artery including the length of the severely stenotic segment. It was decided  to use a 2.25 mm x 12 mm balloon mounted drug-eluting stent. This was retrogradely purged with heparinized saline infusion, and antegradely with 50% contrast and 50% heparinized saline infusion. Using the rapid exchange technique, the stent delivery system was then advanced without difficulty and positioned such that the distal marker was just proximal to the origins of the superior cerebellar arteries. The balloon mounted stent was then deployed in the usual manner using micro inflation syringe device via micro tubing inflating to approximally 2.2 mm in a controlled manner, where it was maintained for approximately 20 seconds. The balloon was deflated and retrieved proximally. A control arteriogram performed through the 5 [place] Catalyst guide catheter in the proximal basilar artery now demonstrated significantly improved caliber  and flow and apposition of the stent across the previously noted stenosis. Brisk flow was noted into the right posterior cerebral artery and the superior cerebellar arteries. At this time, the balloon was retrieved and removed. Control arteriograms were then performed at approximally 10 and 20 minutes post deployment of the stent. These continued to demonstrate excellent apposition of the stent with significantly improved caliber and flow through the stented segment. Brisk flow was noted into the posterior circulation distally. A final control arteriogram performed through the Rist guide catheter in the proximal right vertebral artery continued to demonstrate excellent flow through the vertebral artery proximally and distally. The stented segment of the distal basilar artery demonstrated wide patency with a less than a 10% residual stenosis. Flow continued to be maintained in the right posterior cerebral arteries, superior cerebellar arteries and the anterior-inferior cerebellar arteries including the posterior-inferior cerebellar artery bilaterally. No acute hemodynamic changes were seen  in the periprocedural and in the immediate postprocedural course. The Rist guide catheter was retrieved and removed. The 7 French radial sheath was removed. Hemostasis at the right radial puncture site was achieved with a wrist band. Distal right radial pulse was verified to be present. An immediate CT of the brain demonstrated no evidence of intracranial hemorrhage. At this time diffuse swelling was noted in the forearms to the wrist bilaterally including the lower extremities. An ultrasound performed over the right radial puncture site revealed a hematoma which responded to compression. Because of the paucity of venous access in the upper extremities and lower extremities via of the edema, anesthesia service went ahead and obtained a right internal jugular vein access. It was decided to keep the patient intubated to protect the airway. Patient was then transferred to the neuro ICU for post procedural management. Critical care consultation was also requested, ventilator management, and management of generalized extremity edema. 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 PLAN: Follow-up in the clinic 2 weeks post discharge. Electronically Signed   By: Luanne Bras M.D.   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 Diffuse bilateral vertebrobasilar and right posterior cerebral artery atherosclerotic narrowing. EXAM: PTA INTRACRANIAL COMPARISON:  Recent CT angiogram of the head and neck. MEDICATIONS: Heparin 3,000 units IV. Ancef 2 g IV antibiotic was administered within 1 hour of the procedure. ANESTHESIA/SEDATION: General anesthesia. CONTRAST:  Omnipaque 300 approximately 80 mL. FLUOROSCOPY  TIME:  Fluoroscopy Time: 42 minutes 24 seconds (1765 mGy). COMPLICATIONS: None immediate. TECHNIQUE: Informed written consent was obtained from the son after a thorough discussion of the procedural risks, benefits and alternatives. All questions were addressed. Maximal Sterile Barrier Technique was utilized including caps, mask, sterile gowns, sterile gloves, sterile drape, hand hygiene and skin antiseptic. A timeout was performed prior to the initiation of the procedure. The right forearm to the wrist was prepped and draped in the usual sterile manner. The right radial artery was identified with ultrasound, and its morphology documented permanently in the radiology PACS system. A dorsal palmar anastomosis was verified to be present. Using ultrasound guidance, access into the right radial artery was obtained using a micropuncture set over a 0.018 inch micro guidewire. A 7 French sheath was then inserted without difficulty. The obturator, and micro guidewire were removed. Good  aspiration obtained from the side port of the radial sheath. A cocktail of 2000 units of heparin, 2.5 mg of verapamil, and 200 mcg of nitroglycerin was then infused in diluted form through the sheath without event. A right radial arteriogram was then performed. Over a 0.035 inch Roadrunner guidewire, a combination of a 7 [place] 95 cm sheath with a 5.5 [place] 25 cm Rist support Simmons's 2 catheter was advanced to the proximal right subclavian artery, and access obtained into the right vertebral artery using biplane roadmap technique and constant fluoroscopic guidance. The combination of the Rist guide catheter with the support Simmons 2 catheter was advanced to the distal right vertebral artery. The support catheter and the 035 inch Roadrunner guidewire was removed. Brisk aspiration obtained from the hub of the wrist guide sheath. A gentle control arteriogram performed through the Rist sheath demonstrated no evidence of spasms, dissections or  of intraluminal filling defects. Good angiographic outlet was obtained of the right vertebrobasilar junction and the posterior circulation. FINDINGS: The dominant right vertebral artery origin demonstrates wide patency, with moderate tortuosity proximally. More distally, the right vertebrobasilar junction demonstrates mild atherosclerotic narrowing just proximal to the origin of the right posterior-inferior cerebellar artery. A mild-to-moderate stenosis is also evident of the mid basilar artery just distal to the origin of the anterior-inferior cerebellar arteries. Distal to this there is a severe tapered stenosis of the distal basilar artery proximal to the origins of the superior cerebellar arteries. The right posterior cerebral artery demonstrates approximately 50-60% stenosis in its P1 segment and the P2 segment proximally. There is prompt retrograde opacification into the left vertebrobasilar junction to the left posterior-inferior cerebellar artery. This also unmasks a severe stenosis of the distal left vertebrobasilar junction. ENDOVASCULAR REVASCULARIZATION OF SYMPTOMATIC HIGH-GRADE STENOSIS OF THE DISTAL BASILAR ARTERY Through the 7 [place] Rist guide catheter in the distal right vertebral artery a 5 French 115 cm Catalyst guide catheter was then advanced over a 0.035 inch Roadrunner guidewire and positioned in the distal right vertebrobasilar junction. The guidewire was removed. Good aspiration obtained from the hub of the Catalyst guide catheter. A gentle control arteriogram performed through this demonstrated no evidence of spasms, dissections or of intraluminal filling defects. The distal posterior circulation remained stable. Over an 014 inch Softip 300 cm exchange BMW micro guidewire, an 027 microcatheter was then advanced to the distal end of the 5 [place] guide catheter in the right vertebrobasilar junction. Using a torque device, the micro guidewire with the microcatheter was gently advanced to the  distal basilar artery and advanced through the stenotic distal basilar artery into the right posterior cerebral artery distal P2 P3 junction. At this time, the microcatheter was removed. Gentle control arteriogram performed through the 5 French catheter advanced in the mid basilar artery demonstrated no evidence of dissections or of intraluminal filling defects. Measurements were then performed of the distal basilar artery, and also of the mid basilar artery including the length of the severely stenotic segment. It was decided to use a 2.25 mm x 12 mm balloon mounted drug-eluting stent. This was retrogradely purged with heparinized saline infusion, and antegradely with 50% contrast and 50% heparinized saline infusion. Using the rapid exchange technique, the stent delivery system was then advanced without difficulty and positioned such that the distal marker was just proximal to the origins of the superior cerebellar arteries. The balloon mounted stent was then deployed in the usual manner using micro inflation syringe device via micro tubing inflating to approximally 2.2 mm in  a controlled manner, where it was maintained for approximately 20 seconds. The balloon was deflated and retrieved proximally. A control arteriogram performed through the 5 [place] Catalyst guide catheter in the proximal basilar artery now demonstrated significantly improved caliber and flow and apposition of the stent across the previously noted stenosis. Brisk flow was noted into the right posterior cerebral artery and the superior cerebellar arteries. At this time, the balloon was retrieved and removed. Control arteriograms were then performed at approximally 10 and 20 minutes post deployment of the stent. These continued to demonstrate excellent apposition of the stent with significantly improved caliber and flow through the stented segment. Brisk flow was noted into the posterior circulation distally. A final control arteriogram performed  through the Rist guide catheter in the proximal right vertebral artery continued to demonstrate excellent flow through the vertebral artery proximally and distally. The stented segment of the distal basilar artery demonstrated wide patency with a less than a 10% residual stenosis. Flow continued to be maintained in the right posterior cerebral arteries, superior cerebellar arteries and the anterior-inferior cerebellar arteries including the posterior-inferior cerebellar artery bilaterally. No acute hemodynamic changes were seen in the periprocedural and in the immediate postprocedural course. The Rist guide catheter was retrieved and removed. The 7 French radial sheath was removed. Hemostasis at the right radial puncture site was achieved with a wrist band. Distal right radial pulse was verified to be present. An immediate CT of the brain demonstrated no evidence of intracranial hemorrhage. At this time diffuse swelling was noted in the forearms to the wrist bilaterally including the lower extremities. An ultrasound performed over the right radial puncture site revealed a hematoma which responded to compression. Because of the paucity of venous access in the upper extremities and lower extremities via of the edema, anesthesia service went ahead and obtained a right internal jugular vein access. It was decided to keep the patient intubated to protect the airway. Patient was then transferred to the neuro ICU for post procedural management. Critical care consultation was also requested, ventilator management, and management of generalized extremity edema. 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 PLAN: Follow-up in the clinic 2 weeks post discharge. Electronically Signed   By: Luanne Bras M.D.   On: 11/05/2021 08:21    DG CHEST PORT 1 VIEW  Result Date: 11/01/2021 CLINICAL DATA:  Check insertion of support tubes. Ventilator dependent respiratory failure. EXAM: PORTABLE CHEST 1 VIEW COMPARISON:  Portable chest earlier today at 6:35 p.m., and abdomen and pelvis CT no contrast 03/30/2019. FINDINGS: Chest: 8:19 p.m. interval ETT pullback to 3.4 cm from the carina. Interval NGT insertion which is better demonstrated on the abdomen film. Right IJ central line tip again is seen in the SVC at the azygous confluence. There is no pneumothorax. The lungs are generally clear. There is aortic tortuosity and atherosclerosis mild aortic ectasia, small hiatal hernia. Heart size and vascular pattern are normal. No pleural effusion is seen. Osteopenia and thoracic spondylosis, degenerative change both shoulders. Abdomen: NGT is well below the diaphragm. The tip superimposes to the right of L5 and is probably in the distal gastric antrum, although the course of the tube is more vertical than expected based on the positioning of the stomach on the prior CT. There is no supine evidence of free air. The bowel pattern is nonobstructive with  moderate fecal stasis. The visceral shadows are stable with no visible pathologic calcification although with most of the pelvic area excluded from the exam. Contrast in the kidneys is noted with excretion into the collecting systems. No hydronephrosis. There is osteopenia, degenerative changes and slight levoscoliosis of the lumbar spine. IMPRESSION: 1. Interval ETT pullback to 3.4 cm from the carina. 2. NGT tip superimposed to the right of L5 and more vertical in orientation than expected based on the position of the stomach on the prior CT, probably in the distal gastric antrum. Did the patient have a gastric bypass since the prior CT? 3. No evidence of acute chest disease. 4. Constipation. Electronically Signed   By: Telford Nab M.D.   On: 11/01/2021 21:04   DG Abd Portable 1V  Result Date:  11/01/2021 CLINICAL DATA:  Check insertion of support tubes. Ventilator dependent respiratory failure. EXAM: PORTABLE CHEST 1 VIEW COMPARISON:  Portable chest earlier today at 6:35 p.m., and abdomen and pelvis CT no contrast 03/30/2019. FINDINGS: Chest: 8:19 p.m. interval ETT pullback to 3.4 cm from the carina. Interval NGT insertion which is better demonstrated on the abdomen film. Right IJ central line tip again is seen in the SVC at the azygous confluence. There is no pneumothorax. The lungs are generally clear. There is aortic tortuosity and atherosclerosis mild aortic ectasia, small hiatal hernia. Heart size and vascular pattern are normal. No pleural effusion is seen. Osteopenia and thoracic spondylosis, degenerative change both shoulders. Abdomen: NGT is well below the diaphragm. The tip superimposes to the right of L5 and is probably in the distal gastric antrum, although the course of the tube is more vertical than expected based on the positioning of the stomach on the prior CT. There is no supine evidence of free air. The bowel pattern is nonobstructive with moderate fecal stasis. The visceral shadows are stable with no visible pathologic calcification although with most of the pelvic area excluded from the exam. Contrast in the kidneys is noted with excretion into the collecting systems. No hydronephrosis. There is osteopenia, degenerative changes and slight levoscoliosis of the lumbar spine. IMPRESSION: 1. Interval ETT pullback to 3.4 cm from the carina. 2. NGT tip superimposed to the right of L5 and more vertical in orientation than expected based on the position of the stomach on the prior CT, probably in the distal gastric antrum. Did the patient have a gastric bypass since the prior CT? 3. No evidence of acute chest disease. 4. Constipation. Electronically Signed   By: Telford Nab M.D.   On: 11/01/2021 21:04   Portable Chest x-ray  Result Date: 11/01/2021 CLINICAL DATA:  ET tube EXAM:  PORTABLE CHEST 1 VIEW COMPARISON:  Chest x-ray 06/11/2019 FINDINGS: Endotracheal tube tip is 7 mm above the carina. Right-sided central venous catheter tip projects over the SVC. The lungs are clear. There is no pleural effusion or pneumothorax. The cardiomediastinal silhouette is within normal limits. No acute fractures are seen. IMPRESSION: 1. Endotracheal tube tip is 7 mm above the carina. Consider repositioning. 2. Right-sided central venous catheter tip projects over the SVC. 3. The lungs are clear. Electronically Signed   By: Ronney Asters M.D.   On: 11/01/2021 18:46   EEG adult  Result Date: 10/27/2021 Derek Jack, MD     10/27/2021  7:08 PM Routine EEG Report Cassandra Dodson is a 81 y.o. female with a history of stroke and meningioma who is undergoing an EEG to evaluate for seizures. Report: This EEG was  acquired with electrodes placed according to the International 10-20 electrode system (including Fp1, Fp2, F3, F4, C3, C4, P3, P4, O1, O2, T3, T4, T5, T6, A1, A2, Fz, Cz, Pz). The following electrodes were missing or displaced: none. The occipital dominant rhythm was 6 Hz. This activity is reactive to stimulation. Drowsiness was manifested by background fragmentation; deeper stages of sleep were identified by K complexes and sleep spindles. There was focal slowing over the left frontal region. There were no interictal epileptiform discharges. There were no electrographic seizures identified. Photic stimulation and hyperventilation were not performed. Impression and clinical correlation: This EEG was obtained while awake and asleep and is abnormal due to mild diffuse slowing indicative of global cerebral dysfunction and superimposed focal slowing over the left frontal region in the area of patient's known meningioma. Epileptiform abnormalities were not seen during this recording. Su Monks, MD [facility] [phone] If 7pm- 7am, please page neurology on call as listed in [place].    ECHOCARDIOGRAM COMPLETE  Result Date: 10/26/2021    ECHOCARDIOGRAM REPORT   Patient Name:   Cassandra Dodson Date of Exam: 10/26/2021 Medical Rec #:  [id number]    Height:       65.0 in Accession #:    [id number]   Weight:       169.0 lb Date of Birth:  05/30/1940    BSA:          1.841 m Patient Age:    77 years     BP:           168/102 mmHg Patient Gender: F            HR:           82 bpm. Exam Location:  Inpatient Procedure: 2D Echo, Cardiac Doppler and Color Doppler Indications:    Stroke I63.9  History:        Patient has no prior history of Echocardiogram examinations.                 Risk Factors:Hypertension and Diabetes. Chronic kidney disease.  Sonographer:    Darlina Sicilian RDCS Referring Phys: [phone] DANIEL V THOMPSON  Sonographer Comments: Suboptimal parasternal window. Parasternal measurements attempted. IMPRESSIONS  1. Left ventricular ejection fraction, by estimation, is 60 to 65%. The left ventricle has normal function. The left ventricle has no regional wall motion abnormalities. There is moderate asymmetric left ventricular hypertrophy of the basal-septal segment. Left ventricular diastolic parameters are indeterminate.  2. Right ventricular systolic function is normal. The right ventricular size is normal. Tricuspid regurgitation signal is inadequate for assessing PA pressure.  3. The mitral valve is normal in structure. No evidence of mitral valve regurgitation. No evidence of mitral stenosis.  4. The aortic valve was not well visualized. Aortic valve regurgitation is not visualized. No aortic stenosis is present.  5. The inferior vena cava is normal in size with greater than 50% respiratory variability, suggesting right atrial pressure of 3 mmHg. FINDINGS  Left Ventricle: Left ventricular ejection fraction, by estimation, is 60 to 65%. The left ventricle has normal function. The left ventricle has no regional wall motion abnormalities. The left ventricular internal cavity size was small. There is  moderate  asymmetric left ventricular hypertrophy of the basal-septal segment. Left ventricular diastolic parameters are indeterminate. Right Ventricle: The right ventricular size is normal. No increase in right ventricular wall thickness. Right ventricular systolic function is normal. Tricuspid regurgitation signal is inadequate for assessing PA pressure. Left Atrium: Left atrial size  was normal in size. Right Atrium: Right atrial size was normal in size. Pericardium: There is no evidence of pericardial effusion. Mitral Valve: The mitral valve is normal in structure. No evidence of mitral valve regurgitation. No evidence of mitral valve stenosis. Tricuspid Valve: The tricuspid valve is normal in structure. Tricuspid valve regurgitation is trivial. Aortic Valve: The aortic valve was not well visualized. Aortic valve regurgitation is not visualized. No aortic stenosis is present. Pulmonic Valve: The pulmonic valve was not well visualized. Pulmonic valve regurgitation is not visualized. Aorta: The aortic root is normal in size and structure. Venous: The inferior vena cava is normal in size with greater than 50% respiratory variability, suggesting right atrial pressure of 3 mmHg. IAS/Shunts: The interatrial septum was not well visualized.  LEFT VENTRICLE PLAX 2D LVIDd:         3.55 cm   Diastology LVIDs:         2.30 cm   LV e' medial:    4.12 cm/s LV PW:         1.25 cm   LV E/e' medial:  19.8 LV IVS:        1.30 cm   LV e' lateral:   7.53 cm/s LVOT diam:     1.90 cm   LV E/e' lateral: 10.8 LV SV:         61 LV SV Index:   33 LVOT Area:     2.84 cm  RIGHT VENTRICLE RV S prime:     19.50 cm/s TAPSE (M-mode): 2.0 cm LEFT ATRIUM             Index        RIGHT ATRIUM           Index LA diam:        3.40 cm 1.85 cm/m   RA Area:     12.60 cm LA Vol (A2C):   43.7 ml 23.73 ml/m  RA Volume:   23.40 ml  12.71 ml/m LA Vol (A4C):   49.9 ml 27.10 ml/m LA Biplane Vol: 47.6 ml 25.85 ml/m  AORTIC VALVE LVOT Vmax:   88.80 cm/s  LVOT Vmean:  64.300 cm/s LVOT VTI:    0.214 m  AORTA Ao Root diam: 2.90 cm MITRAL VALVE MV Area (PHT): 4.19 cm    SHUNTS MV Decel Time: 181 msec    Systemic VTI:  0.21 m MV E velocity: 81.40 cm/s  Systemic Diam: 1.90 cm MV A velocity: 72.40 cm/s MV E/A ratio:  1.12 Oswaldo Milian MD Electronically signed by Oswaldo Milian MD Signature Date/Time: 10/26/2021/5:29:43 PM    Final    CT ANGIO HEAD W OR WO CONTRAST  Result Date: 10/26/2021 CLINICAL DATA:  Stroke, follow up EXAM: CT ANGIOGRAPHY HEAD AND NECK TECHNIQUE: Multidetector CT imaging of the head and neck was performed using the standard protocol during bolus administration of intravenous contrast. Multiplanar CT image reconstructions and MIPs were obtained to evaluate the vascular anatomy. Carotid stenosis measurements (when applicable) are obtained utilizing NASCET criteria, using the distal internal carotid diameter as the denominator. RADIATION DOSE REDUCTION: This exam was performed according to the departmental dose-optimization program which includes automated exposure control, adjustment of the mA and/or kV according to patient size and/or use of iterative reconstruction technique. CONTRAST:  103m OMNIPAQUE IOHEXOL 350 MG/ML SOLN COMPARISON:  None Available. FINDINGS: CTA NECK Aortic arch: Calcified plaque.  Great vessel origins are patent. Right carotid system: Patent. Calcified plaque at the bifurcation and proximal internal carotid with  less than 50% stenosis. Left carotid system: Patent. Noncalcified plaque at the common carotid origin with minimal stenosis. Calcified plaque at the bifurcation and proximal internal carotid with less than 50% stenosis. Vertebral arteries: Patent. Plaque at the left vertebral origin causes mild stenosis. Skeleton: Degenerative changes of the included spine. Other neck: Asymmetric prominence of the left palatine tonsil without discrete mass. Upper chest: Emphysema. Review of the MIP images confirms the above  findings CTA HEAD Anterior circulation: Intracranial internal carotid arteries are patent with calcified plaque causing up to moderate stenosis. Anterior and middle cerebral arteries are patent. There is displacement of the anterior cerebral arteries by the falcine meningioma. High-grade stenosis of proximal left M2 MCA branch. High-grade stenosis of left A2 ACA. Posterior circulation: Intracranial vertebral arteries are patent with atherosclerotic irregularity. There is mild stenosis on the right and moderate stenosis on the left. Basilar artery is patent with atherosclerotic irregularity. There is up to marked stenosis distally. Left posterior communicating artery is present with fetal origin of the left posterior cerebral artery. The posterior cerebral arteries are patent with atherosclerotic irregularity. Moderate stenosis at the right P1-P2 junction. Venous sinuses: Patent as allowed by contrast bolus timing. Review of the MIP images confirms the above findings IMPRESSION: No large vessel occlusion. Plaque at the ICA origins causes less than 50% stenosis. Multifocal intracranial atherosclerosis with stenoses. Of note, there is focal marked stenosis of the basilar proximal to the superior cerebellar artery origins. Electronically Signed   By: Macy Mis M.D.   On: 10/26/2021 10:35   CT ANGIO NECK W OR WO CONTRAST  Result Date: 10/26/2021 CLINICAL DATA:  Stroke, follow up EXAM: CT ANGIOGRAPHY HEAD AND NECK TECHNIQUE: Multidetector CT imaging of the head and neck was performed using the standard protocol during bolus administration of intravenous contrast. Multiplanar CT image reconstructions and MIPs were obtained to evaluate the vascular anatomy. Carotid stenosis measurements (when applicable) are obtained utilizing NASCET criteria, using the distal internal carotid diameter as the denominator. RADIATION DOSE REDUCTION: This exam was performed according to the departmental dose-optimization program which  includes automated exposure control, adjustment of the mA and/or kV according to patient size and/or use of iterative reconstruction technique. CONTRAST:  75m OMNIPAQUE IOHEXOL 350 MG/ML SOLN COMPARISON:  None Available. FINDINGS: CTA NECK Aortic arch: Calcified plaque.  Great vessel origins are patent. Right carotid system: Patent. Calcified plaque at the bifurcation and proximal internal carotid with less than 50% stenosis. Left carotid system: Patent. Noncalcified plaque at the common carotid origin with minimal stenosis. Calcified plaque at the bifurcation and proximal internal carotid with less than 50% stenosis. Vertebral arteries: Patent. Plaque at the left vertebral origin causes mild stenosis. Skeleton: Degenerative changes of the included spine. Other neck: Asymmetric prominence of the left palatine tonsil without discrete mass. Upper chest: Emphysema. Review of the MIP images confirms the above findings CTA HEAD Anterior circulation: Intracranial internal carotid arteries are patent with calcified plaque causing up to moderate stenosis. Anterior and middle cerebral arteries are patent. There is displacement of the anterior cerebral arteries by the falcine meningioma. High-grade stenosis of proximal left M2 MCA branch. High-grade stenosis of left A2 ACA. Posterior circulation: Intracranial vertebral arteries are patent with atherosclerotic irregularity. There is mild stenosis on the right and moderate stenosis on the left. Basilar artery is patent with atherosclerotic irregularity. There is up to marked stenosis distally. Left posterior communicating artery is present with fetal origin of the left posterior cerebral artery. The posterior cerebral arteries are patent  with atherosclerotic irregularity. Moderate stenosis at the right P1-P2 junction. Venous sinuses: Patent as allowed by contrast bolus timing. Review of the MIP images confirms the above findings IMPRESSION: No large vessel occlusion. Plaque at  the ICA origins causes less than 50% stenosis. Multifocal intracranial atherosclerosis with stenoses. Of note, there is focal marked stenosis of the basilar proximal to the superior cerebellar artery origins. Electronically Signed   By: Macy Mis M.D.   On: 10/26/2021 10:35   MR Brain W and Wo Contrast  Result Date: 10/25/2021 CLINICAL DATA:  Meningioma EXAM: MRI HEAD WITHOUT AND WITH CONTRAST TECHNIQUE: Multiplanar, multiecho pulse sequences of the brain and surrounding structures were obtained without and with intravenous contrast. CONTRAST:  57m GADAVIST GADOBUTROL 1 MMOL/ML IV SOLN COMPARISON:  None Available. FINDINGS: Brain: There is a large, acute right paramedian pontine infarct. Punctate focus of acute ischemia in the left parietal white matter. No acute or chronic hemorrhage. Anterior cranial fossa meningioma measures 3.9 x 3.4 x 4.0 cm. There is a large amount of surrounding vasogenic edema in the inferior left frontal lobe. There is rightward mass effect on the right cingulate gyrus. There is subfalcine herniation of the left cingulate gyrus. Vascular: Major flow voids are preserved. Skull and upper cervical spine: Normal calvarium and skull base. Visualized upper cervical spine and soft tissues are normal. Sinuses/Orbits:No paranasal sinus fluid levels or advanced mucosal thickening. No mastoid or middle ear effusion. Normal orbits. IMPRESSION: 1. Large, acute right paramedian pontine infarct. 2. Anterior cranial fossa meningioma with large amount of surrounding vasogenic edema in the inferior left frontal lobe. Rightward midline shift with subfalcine herniation of the left cingulate gyrus. Electronically Signed   By: KUlyses JarredM.D.   On: 10/25/2021 23:40   CT HEAD WO CONTRAST  Result Date: 10/25/2021 CLINICAL DATA:  Slurred speech and increased confusion. History of hypertension, diabetes, and endometrial cancer. EXAM: CT HEAD WITHOUT CONTRAST TECHNIQUE: Contiguous axial images were  obtained from the base of the skull through the vertex without intravenous contrast. RADIATION DOSE REDUCTION: This exam was performed according to the departmental dose-optimization program which includes automated exposure control, adjustment of the mA and/or kV according to patient size and/or use of iterative reconstruction technique. COMPARISON:  None Available. FINDINGS: Brain: Sharply defined low-density encephalomalacia compatible with small remote infarct of the right inferior cerebellum, image 8 series 2. A high density 5.0 by 3.8 by 3.6 cm mass primarily centered in the left anterior cranial fossa but abuts the planum sphenoidale and anterior falx, with 2.0 cm of localized left-to-right midline shift and with extensive vasogenic edema in the left frontal lobe and a small amount of edema tracking into the left temporal lobe. Effacement of part of the frontal horn left lateral ventricle. This mass is highly likely to be a meningioma. No overt hydrocephalus. No intracranial hemorrhage or definite findings of acute CVA. Vascular: There is atherosclerotic calcification of the cavernous carotid arteries bilaterally. Skull: Unremarkable Sinuses/Orbits: Mild chronic ethmoid sinusitis. Other: No supplemental non-categorized findings. IMPRESSION: 1. 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 Frontal horn of the left lateral ventricle is partially effaced. 2. 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 3. Atherosclerosis. 4. Mild chronic ethmoid sinusitis. Electronically Signed   By: WVan ClinesM.D.   On: 10/25/2021 16:27  Microbiology: Results for orders placed or performed during the hospital encounter of 10/25/21  Urine Culture     Status: Abnormal   Collection Time: 10/27/21  9:10 AM    Specimen: Urine, Catheterized  Result Value Ref Range Status   Specimen Description URINE, CATHETERIZED  Final   Special Requests   Final    NONE Performed at [facility], [address] [place], [place] [address]    Culture >=100,000 COLONIES/mL ESCHERICHIA COLI (A)  Final   Report Status 10/29/2021 FINAL  Final   Organism ID, Bacteria ESCHERICHIA COLI (A)  Final      Susceptibility   Escherichia coli - MIC*    AMPICILLIN 8 SENSITIVE Sensitive     CEFAZOLIN <=4 SENSITIVE Sensitive     CEFEPIME <=0.12 SENSITIVE Sensitive     CEFTRIAXONE <=0.25 SENSITIVE Sensitive     CIPROFLOXACIN <=0.25 SENSITIVE Sensitive     GENTAMICIN <=1 SENSITIVE Sensitive     IMIPENEM <=0.25 SENSITIVE Sensitive     NITROFURANTOIN 32 SENSITIVE Sensitive     TRIMETH/SULFA <=20 SENSITIVE Sensitive     AMPICILLIN/SULBACTAM 8 SENSITIVE Sensitive     PIP/TAZO <=4 SENSITIVE Sensitive     * >=100,000 COLONIES/mL ESCHERICHIA COLI  MRSA Next Gen by PCR, Nasal     Status: None   Collection Time: 11/02/21  4:13 PM   Specimen: Nasal Mucosa; Nasal Swab  Result Value Ref Range Status   MRSA by PCR Next Gen NOT DETECTED NOT DETECTED Final    Comment: (NOTE) The GeneXpert MRSA Assay (FDA approved for NASAL specimens only), is one component of a comprehensive MRSA colonization surveillance program. It is not intended to diagnose MRSA infection nor to guide or monitor treatment for MRSA infections. Test performance is not FDA approved in patients less than 44 years old. Performed at [facility], [place] [address] [place], [place] [address]     Labs: CBC: Recent Labs  Lab 11/01/21 0309 11/01/21 1818 11/01/21 1845 11/02/21 0528 11/04/21 0259 11/05/21 0235  WBC 9.9 8.7  --  10.9* 7.4 9.1  NEUTROABS 8.2* 7.2  --  8.9* 5.9 7.5  HGB 13.6 13.1 12.9 13.1 13.2 12.7  HCT 39.7 39.4 38.0 40.0 39.1 38.1  MCV 89.2 92.5  --  92.8 90.7 91.4  PLT 148* 136*  --  178 165 016   Basic Metabolic Panel: Recent  Labs  Lab 10/31/21 0108 11/01/21 0309 11/01/21 1845 11/02/21 0528 11/04/21 0259 11/05/21 0235  NA 135 134* 139 138 132* 133*  K 4.7 4.9 4.5 4.3 4.9 5.0  CL 99 99  --  103 102 104  CO2 26 25  --  22 22 21*  GLUCOSE 277* 265*  --  175* 221* 206*  BUN 48* 55*  --  45* 51* 52*  CREATININE 1.43* 1.69*  --  1.09* 1.16* 1.45*  CALCIUM 9.0 9.1  --  8.2* 8.7* 8.7*  MG 2.3 2.2  --  2.0 2.3 2.1  PHOS  --  3.6  --  4.2 3.9 3.6   Liver Function Tests: Recent Labs  Lab 11/01/21 0309 11/02/21 0528 11/04/21 0259 11/05/21 0235  AST 12* 13* 14* 13*  ALT '10 10 11 10  '$ ALKPHOS 59 49 55 56  BILITOT 0.4 0.3 0.6 0.8  PROT 5.8* 5.6* 6.2* 5.7*  ALBUMIN 2.8* 2.7* 3.0* 2.8*   CBG: Recent Labs  Lab 11/04/21 1209 11/04/21 1739 11/04/21 2122 11/04/21 2140 11/05/21 0732  GLUCAP 138* 222* 193* 198* 168*  Discharge time spent: greater than 30 minutes.  Signed: Raiford Noble, DO [facility] 11/05/2021

## 2021-11-05 NOTE — Progress Notes (Signed)
Patient Status:  [facility] - In-pt  Supervising physician: Dr. Luanne Bras  Subjective:  Eating breakfast at time of visit.  No new complaints or concerns, planning to d/c to rehab.   Allergies: Anesthetics, amide and Ether  Medications: Prior to Admission medications   Medication Sig Start Date End Date Taking? Authorizing Provider  ALPRAZolam Duanne Moron) 0.5 MG tablet Take 0.25-0.5 mg by mouth at bedtime as needed for sleep. 09/21/21  Yes [provider]  aspirin EC 81 MG tablet Take 81 mg by mouth daily. Swallow whole.   Yes [provider]  calcium carbonate (OSCAL) 1500 (600 Ca) MG TABS tablet Take 1,500 mg by mouth daily with breakfast.   Yes [provider]  fenofibrate 160 MG tablet Take 160 mg by mouth daily after breakfast.  05/12/16  Yes [provider]  HUMALOG MIX 75/25 KWIKPEN (75-25) 100 UNIT/ML Kwikpen Inject 30 Units into the skin in the morning and at bedtime. 06/08/16  Yes [provider]  metFORMIN (GLUCOPHAGE) 1000 MG tablet Take 1,000 mg by mouth 2 (two) times daily.  01/21/15  Yes [provider]  telmisartan (MICARDIS) 80 MG tablet Take 80 mg by mouth daily after breakfast.  06/01/19  Yes [provider]     Vital Signs: BP (!) 150/63 (BP Location: Left Arm)   Pulse 62   Temp 97.6 F (36.4 C) (Oral)   Resp 17   Ht '5\' 5"'$  (1.651 m)   Wt 186 lb 11.7 oz (84.7 kg)   SpO2 100%   BMI 31.07 kg/m   Physical examination: Alert and oriented x3 Difficulty hearing  There is mild weakness seen in the left UE-4/5 and LEs-4/5, with some improvement Reminder of the extremities are wnl.  Sensory function is normal.  There is moderate bruising to the RUE which extends the length of the forearm.  Soft, no oozing.  No signs of extension.  Bruising to the right IJ prior line site. Stable.    Labs:  CBC: Recent Labs    11/01/21 1818 11/01/21 1845 11/02/21 0528 11/04/21 0259 11/05/21 0235  WBC 8.7  --   10.9* 7.4 9.1  HGB 13.1 12.9 13.1 13.2 12.7  HCT 39.4 38.0 40.0 39.1 38.1  PLT 136*  --  178 165 167    COAGS: Recent Labs    10/25/21 1540 10/30/21 0449 11/01/21 1127  INR 1.1 1.2 1.2  APTT 25  --   --     BMP: Recent Labs    11/01/21 0309 11/01/21 1845 11/02/21 0528 11/04/21 0259 11/05/21 0235  NA 134* 139 138 132* 133*  K 4.9 4.5 4.3 4.9 5.0  CL 99  --  103 102 104  CO2 25  --  22 22 21*  GLUCOSE 265*  --  175* 221* 206*  BUN 55*  --  45* 51* 52*  CALCIUM 9.1  --  8.2* 8.7* 8.7*  CREATININE 1.69*  --  1.09* 1.16* 1.45*  GFRNONAA 30*  --  51* 47* 36*    LIVER FUNCTION TESTS: Recent Labs    11/01/21 0309 11/02/21 0528 11/04/21 0259 11/05/21 0235  BILITOT 0.4 0.3 0.6 0.8  AST 12* 13* 14* 13*  ALT '10 10 11 10  '$ ALKPHOS 59 49 55 56  PROT 5.8* 5.6* 6.2* 5.7*  ALBUMIN 2.8* 2.7* 3.0* 2.8*    Assessment and Plan: 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  It is soft without evidence of progression.  She also has a small bruise to the right IJ at the site of the site of the prior central line.  P2Y12 pending.  Appears to be stable post-procedure. Will need to continue DAPT for 6-8 weeks, then plans per Neurosurgery for meningioma.   Electronically Signed: Docia Barrier, PA 11/05/2021, 2:09 PM   I spent a total of 15 Minutes at the the patient's bedside AND on the patient's hospital floor or unit, greater than 50% of which was counseling/coordinating care for basilar artery stenosis.

## 2021-11-05 NOTE — Progress Notes (Signed)
Inpatient Rehab Admissions Coordinator:    I have a CIR bed for this Pt. Today. MD to d/c. RN may call report to [place], [place], [place] Admissions Coordinator  [phone] ([place]) [phone] (office)

## 2021-11-05 NOTE — Discharge Instructions (Addendum)
Inpatient Rehab Discharge Instructions  Cassandra Dodson Discharge date and time: No discharge date for patient encounter.   Activities/Precautions/ Functional Status: Activity: As tolerated Diet:  Wound Care: Routine skin checks Functional status:  ___ No restrictions     ___ Walk up steps independently ___ 24/7 supervision/assistance   ___ Walk up steps with assistance ___ Intermittent supervision/assistance  ___ Bathe/dress independently ___ Walk with walker     _x__ Bathe/dress with assistance ___ Walk Independently    ___ Shower independently ___ Walk with assistance    ___ Shower with assistance ___ No alcohol     ___ Return to work/school ________  Special Instructions: No driving smoking or alcohol     COMMUNITY REFERRALS UPON DISCHARGE:    Home Health:   PT   OT   SP   RN                  [place]  [phone]  [employer]  [phone]                                                   My questions have been answered and I understand these instructions. I will adhere to these goals and the provided educational materials after my discharge from the hospital.  Patient/Caregiver Signature _______________________________ Date __________  Clinician Signature _______________________________________ Date __________  Please bring this form and your medication list with you to all your follow-up doctor's appointments.  Inpatient Rehab Discharge Instructions  Cassandra Dodson Discharge date and time: No discharge date for patient encounter.   Activities/Precautions/ Functional Status:STROKE/TIA DISCHARGE INSTRUCTIONS SMOKING Cigarette smoking nearly doubles your risk of having a stroke & is the single most alterable risk factor  If you smoke or have smoked in the last 12 months, you are advised to quit smoking for your health. Most of the excess  cardiovascular risk related to smoking disappears within a year of stopping. Ask you doctor about anti-smoking medications [place] Quit Line: 1-800-QUIT NOW Free Smoking Cessation Classes [phone]  CHOLESTEROL Know your levels; limit fat & cholesterol in your diet  Lipid Panel     Component Value Date/Time   CHOL 198 10/27/2021 0234   TRIG 147 11/02/2021 0528   HDL 53 10/27/2021 0234   CHOLHDL 3.7 10/27/2021 0234   VLDL 12 10/27/2021 0234   LDLCALC 133 (H) 10/27/2021 0234     Many patients benefit from treatment even if their cholesterol is at goal. Goal: Total Cholesterol (CHOL) less than 160 Goal:  Triglycerides (TRIG) less than 150 Goal:  HDL greater than 40 Goal:  LDL (LDLCALC) less than 100   BLOOD PRESSURE American Stroke Association blood pressure target is less that 120/80 mm/Hg  Your discharge blood pressure is:  BP: (!) 123/58 Monitor your blood pressure Limit your salt and alcohol intake Many individuals will require more than one medication for high blood pressure  DIABETES (A1c is a blood sugar average for last 3 months) Goal HGBA1c is under 7% (HBGA1c is blood sugar average for last 3 months)  Diabetes: Inpatient Rehab Discharge Instructions  Cassandra Dodson Discharge date and time: No discharge date for patient encounter.   Activities/Precautions/ Functional Status: Activity: activity as tolerated Diet: diabetic diet Wound Care:  Routine skin checks Functional status:  ___ No restrictions     ___ Walk up steps independently ___ 24/7 supervision/assistance   ___ Walk up steps with assistance ___ Intermittent supervision/assistance  _x__ Bathe/dress independently ___ Walk with walker     ___ Bathe/dress with assistance ___ Walk Independently    ___ Shower independently ___ Walk with assistance    ___ Shower with assistance ___ No alcohol     ___ Return to work/school ________  Special Instructions: No driving smoking or alcohol   My questions have been  answered and I understand these instructions. I will adhere to these goals and the provided educational materials after my discharge from the hospital.  Patient/Caregiver Signature _______________________________ Date __________  Clinician Signature _______________________________________ Date __________  Please bring this form and your medication list with you to all your follow-up doctor's appointments.      Lab Results  Component Value Date   HGBA1C 7.0 (H) 10/26/2021    Your HGBA1c can be lowered with medications, healthy diet, and exercise. Check your blood sugar as directed by your physician Call your physician if you experience unexplained or low blood sugars.  PHYSICAL ACTIVITY/REHABILITATION Goal is 30 minutes at least 4 days per week  Activity: Increase activity slowly, Therapies: Physical Therapy: Home Health Return to work:  Activity decreases your risk of heart attack and stroke and makes your heart stronger.  It helps control your weight and blood pressure; helps you relax and can improve your mood. Participate in a regular exercise program. Talk with your doctor about the best form of exercise for you (dancing, walking, swimming, cycling).  DIET/WEIGHT Goal is to maintain a healthy weight  Your discharge diet is:  Diet Order             DIET DYS 3 Room service appropriate? Yes with Assist; Fluid consistency: Nectar Thick  Diet effective now                   liquids Your height is:  Height: '5\' 5"'$  (165.1 cm) Your current weight is: Weight: 78.1 kg Your Body Mass Index (BMI) is:  BMI (Calculated): 28.65 Following the type of diet specifically designed for you will help prevent another stroke. Your goal weight range is:   Your goal Body Mass Index (BMI) is 19-24. Healthy food habits can help reduce 3 risk factors for stroke:  High cholesterol, hypertension, and excess weight.  RESOURCES Stroke/Support Group:  Call [phone]   STROKE EDUCATION PROVIDED/REVIEWED  AND GIVEN TO PATIENT Stroke warning signs and symptoms How to activate emergency medical system (call 911). Medications prescribed at discharge. Need for follow-up after discharge. Personal risk factors for stroke. Pneumonia vaccine given: No Flu vaccine given: No My questions have been answered, the writing is legible, and I understand these instructions.  I will adhere to these goals & educational materials that have been provided to me after my discharge from the hospital.     Activity: activity as tolerated Diet: diabetic diet Wound Care: Routine skin checks Functional status:  ___ No restrictions     ___ Walk up steps independently ___ 24/7 supervision/assistance   ___ Walk up steps with assistance ___ Intermittent supervision/assistance  ___ Bathe/dress independently ___ Walk with walker     __x_ Bathe/dress with assistance ___ Walk Independently    ___ Shower independently ___ Walk with assistance    ___ Shower with assistance ___ No alcohol     ___ Return to work/school ________  Special Instructions:  No driving smoking or alcohol     COMMUNITY REFERRALS UPON DISCHARGE:    Home Health:   PT  OT  SP  RN                  [place]    Phone: [phone]   Medical Equipment/Items [place]                                                 Agency/Supplier:[facility]   [phone]  [employer]  [phone]    STROKE/TIA DISCHARGE INSTRUCTIONS SMOKING Cigarette smoking nearly doubles your risk of having a stroke & is the single most alterable risk factor  If you smoke or have smoked in the last 12 months, you are advised to quit smoking for your health. Most of the excess cardiovascular risk related to smoking disappears within a year of stopping. Ask you doctor about anti-smoking medications [place] Quit Line: 1-800-QUIT NOW Free Smoking Cessation Classes [phone]  CHOLESTEROL Know your levels;  limit fat & cholesterol in your diet  Lipid Panel     Component Value Date/Time   CHOL 198 10/27/2021 0234   TRIG 147 11/02/2021 0528   HDL 53 10/27/2021 0234   CHOLHDL 3.7 10/27/2021 0234   VLDL 12 10/27/2021 0234   LDLCALC 133 (H) 10/27/2021 0234     Many patients benefit from treatment even if their cholesterol is at goal. Goal: Total Cholesterol (CHOL) less than 160 Goal:  Triglycerides (TRIG) less than 150 Goal:  HDL greater than 40 Goal:  LDL (LDLCALC) less than 100   BLOOD PRESSURE American Stroke Association blood pressure target is less that 120/80 mm/Hg  Your discharge blood pressure is:  BP: (!) 123/58 Monitor your blood pressure Limit your salt and alcohol intake Many individuals will require more than one medication for high blood pressure  DIABETES (A1c is a blood sugar average for last 3 months) Goal HGBA1c is under 7% (HBGA1c is blood sugar average for last 3 months)  Diabetes:    Lab Results  Component Value Date   HGBA1C 7.0 (H) 10/26/2021    Your HGBA1c can be lowered with medications, healthy diet, and exercise. Check your blood sugar as directed by your physician Call your physician if you experience unexplained or low blood sugars.  PHYSICAL ACTIVITY/REHABILITATION Goal is 30 minutes at least 4 days per week  Activity: Increase activity slowly, Therapies: see above Return to work: N/A Activity decreases your risk of heart attack and stroke and makes your heart stronger.  It helps control your weight and blood pressure; helps you relax and can improve your mood. Participate in a regular exercise program. Talk with your doctor about the best form of exercise for you (dancing, walking, swimming, cycling).  DIET/WEIGHT Goal is to maintain a healthy weight  Your discharge diet is:  Diet Order             DIET DYS 3 Room service appropriate? Yes with Assist; Fluid consistency: Nectar Thick  Diet effective now                   liquids Your height  is:  Height: '5\' 5"'$  (165.1 cm) Your current weight is: Weight: 171 lbs Your Body Mass Index (BMI) is:  BMI (Calculated): 28.65 Following  the type of diet specifically designed for you will help prevent another stroke. Your goal weight is:  150lbs Your goal Body Mass Index (BMI) is 19-24. Healthy food habits can help reduce 3 risk factors for stroke:  High cholesterol, hypertension, and excess weight.  RESOURCES Stroke/Support Group:  Call [phone]   STROKE EDUCATION PROVIDED/REVIEWED AND GIVEN TO PATIENT Stroke warning signs and symptoms How to activate emergency medical system (call 911). Medications prescribed at discharge. Need for follow-up after discharge. Personal risk factors for stroke. Pneumonia vaccine given: No Flu vaccine given: No My questions have been answered, the writing is legible, and I understand these instructions.  I will adhere to these goals & educational materials that have been provided to me after my discharge from the hospital.    My questions have been answered and I understand these instructions. I will adhere to these goals and the provided educational materials after my discharge from the hospital.  Patient/Caregiver Signature _______________________________ Date __________  Clinician Signature _______________________________________ Date __________  Please bring this form and your medication list with you to all your follow-up doctor's appointments.

## 2021-11-05 NOTE — Progress Notes (Signed)
Physical Therapy Treatment Patient Details Name: Cassandra Dodson MRN: [id number] DOB: 1940/04/24 Today's Date: 11/05/2021   History of Present Illness Pt is an 81 y/o female admitted 7/6 with slurred speech and L sided weakness. 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 PMH including but not limited to HTN, DM, CKD3, GERD and endometrial cancer.    PT Comments    Returned to assist with stedy back to bed as pt was leaning out of the left side of recliner upon arrival of RN and PT. The pt required increased cues and stimulation to correct posture, positioning, and assist to manage movement of BLE into useful position for stance. She continues to need modA to power up to standing, and needs continued modA to maintain static standing in stedy due to strong L lean and short attention. The pt was able to be safely transferred back to bed, reports significant fatigue prohibiting further mobility at this time.     Recommendations for follow up therapy are one component of a multi-disciplinary discharge planning process, led by the attending physician.  Recommendations may be updated based on patient status, additional functional criteria and insurance authorization.  Follow Up Recommendations  Acute inpatient rehab (3hours/day)     Assistance Recommended at Discharge Frequent or constant Supervision/Assistance  Patient can return home with the following Assistance with cooking/housework;Direct supervision/assist for medications management;Direct supervision/assist for financial management;Assist for transportation;Help with stairs or ramp for entrance;A lot of help with walking and/or  transfers;A lot of help with bathing/dressing/bathroom   Equipment Recommendations  None recommended by PT    Recommendations for Other Services       Precautions / Restrictions Precautions Precautions: Fall Precaution Comments: urinary incontinence Restrictions Weight Bearing Restrictions: No     Mobility  Bed Mobility Overal bed mobility: Needs Assistance Bed Mobility: Sit to Supine     Supine to sit: Min assist Sit to supine: Min assist, Mod assist   General bed mobility comments: minA with max cues to return trunk to bed then modA to lift LE to bed    Transfers Overall transfer level: Needs assistance Equipment used: Rolling walker (2 wheels) Transfers: Sit to/from Stand, Bed to chair/wheelchair/BSC Sit to Stand: Mod assist, +2 physical assistance   Step pivot transfers: Max assist, +2 physical assistance       General transfer comment: modA of 2 to power up to standing,cues to maintain upright posture with L lateral lean. The pt was returned to bed with sedy and tolerated well but needs modA to maintain balance on stedy while moving Transfer via Lift Equipment: Stedy  Ambulation/Gait             Pre-gait activities: attempted standing marches, L knee buckling  with stance General Gait Details: pt unable to manage more than 1-2 steps with maxA of 2 from EOB    Modified Rankin (Stroke Patients Only) Modified Rankin (Stroke Patients Only) Pre-Morbid Rankin Score: Moderately severe disability Modified Rankin: Severe disability     Balance Overall balance assessment: Needs assistance Sitting-balance support: Feet supported, Bilateral upper extremity supported Sitting balance-Leahy Scale: Poor Sitting balance - Comments: left lateral lean with cues for finding midline, pt at times overcorrecting with cues Postural control:  Left lateral lean Standing balance support: Bilateral upper extremity supported Standing balance-Leahy Scale: Poor Standing  balance comment: dependent on BUE support and maxA from therapists                            Cognition Arousal/Alertness: Lethargic Behavior During Therapy: Flat affect Overall Cognitive Status: Impaired/Different from baseline Area of Impairment: Orientation, Attention, Memory, Following commands, Safety/judgement, Awareness, Problem solving                 Orientation Level: Disoriented to, Time Current Attention Level: Sustained Memory: Decreased short-term memory Following Commands: Follows one step commands with increased time, Follows one step commands inconsistently Safety/Judgement: Decreased awareness of deficits, Decreased awareness of safety Awareness: Intellectual Problem Solving: Slow processing, Decreased initiation, Difficulty sequencing, Requires verbal cues General Comments: pt more lethargic this afternoon, leaning out of chair to the L with little awareness, needing max cues and stimulation to follow. requries increased time        Exercises      General Comments General comments (skin integrity, edema, etc.): VSS on RA      Pertinent Vitals/Pain Pain Assessment Pain Assessment: No/denies pain     PT Goals (current goals can now be found in the care plan section) Acute Rehab PT Goals Patient Stated Goal: to go home PT Goal Formulation: With patient/family Time For Goal Achievement: 11/16/21 Potential to Achieve Goals: Good Progress towards PT goals: Progressing toward goals    Frequency    Min 4X/week      PT Plan Current plan remains appropriate    Co-evaluation PT/OT/SLP Co-Evaluation/Treatment:  (No)            AM-PAC PT "6 Clicks" Mobility   Outcome Measure  Help needed turning from your back to your side while in a flat bed without using bedrails?: A Lot Help needed moving from lying on your back to sitting on the side of a flat bed without using bedrails?: A Lot Help needed moving to and from a bed to a chair  (including a wheelchair)?: Total Help needed standing up from a chair using your arms (e.g., wheelchair or bedside chair)?: A Lot Help needed to walk in hospital room?: Total Help needed climbing 3-5 steps with a railing? : Total 6 Click Score: 9    End of Session Equipment Utilized During Treatment: Gait belt Activity Tolerance: Patient tolerated treatment well Patient left: with call bell/phone within reach;in bed;with bed alarm set;with nursing/sitter in room Nurse Communication: Mobility status PT Visit Diagnosis: Other abnormalities of gait and mobility (R26.89);Other symptoms and signs involving the nervous system (G28.366)     Time: 2947-6546 PT Time Calculation (min) (ACUTE ONLY): 11 min  Charges:  $Therapeutic Activity: 8-22 mins                     [place] Carbo, PT, DPT   Acute Rehabilitation Department   Sandra Cockayne 11/05/2021, 2:00 PM

## 2021-11-05 NOTE — Progress Notes (Signed)
Patient ID: Cassandra Dodson, female   DOB: June 07, 1940, 82 y.o.   MRN: [id number]  Pt arrived to 4M13 per bed. Pt A+Ox2. Pt confused. Pt oriented to rehab. Policies reviewed. Assessment complete. Vitals obtained. Call light in reach, bed in low position. Sheela Stack, LPN

## 2021-11-06 DIAGNOSIS — I10 Essential (primary) hypertension: Secondary | ICD-10-CM | POA: Diagnosis not present

## 2021-11-06 DIAGNOSIS — E1140 Type 2 diabetes mellitus with diabetic neuropathy, unspecified: Secondary | ICD-10-CM | POA: Diagnosis not present

## 2021-11-06 DIAGNOSIS — I6350 Cerebral infarction due to unspecified occlusion or stenosis of unspecified cerebral artery: Secondary | ICD-10-CM | POA: Diagnosis not present

## 2021-11-06 DIAGNOSIS — E785 Hyperlipidemia, unspecified: Secondary | ICD-10-CM | POA: Diagnosis not present

## 2021-11-06 LAB — CBC WITH DIFFERENTIAL/PLATELET
Abs Immature Granulocytes: 0.07 10*3/uL (ref 0.00–0.07)
Basophils Absolute: 0 10*3/uL (ref 0.0–0.1)
Basophils Relative: 0 %
Eosinophils Absolute: 0 10*3/uL (ref 0.0–0.5)
Eosinophils Relative: 0 %
HCT: 34.9 % — ABNORMAL LOW (ref 36.0–46.0)
Hemoglobin: 11.9 g/dL — ABNORMAL LOW (ref 12.0–15.0)
Immature Granulocytes: 1 %
Lymphocytes Relative: 19 %
Lymphs Abs: 1.5 10*3/uL (ref 0.7–4.0)
MCH: 30.6 pg (ref 26.0–34.0)
MCHC: 34.1 g/dL (ref 30.0–36.0)
MCV: 89.7 fL (ref 80.0–100.0)
Monocytes Absolute: 0.8 10*3/uL (ref 0.1–1.0)
Monocytes Relative: 10 %
Neutro Abs: 5.7 10*3/uL (ref 1.7–7.7)
Neutrophils Relative %: 70 %
Platelets: 163 10*3/uL (ref 150–400)
RBC: 3.89 MIL/uL (ref 3.87–5.11)
RDW: 14.3 % (ref 11.5–15.5)
WBC: 8.2 10*3/uL (ref 4.0–10.5)
nRBC: 0 % (ref 0.0–0.2)

## 2021-11-06 LAB — COMPREHENSIVE METABOLIC PANEL
ALT: 10 U/L (ref 0–44)
AST: 13 U/L — ABNORMAL LOW (ref 15–41)
Albumin: 2.7 g/dL — ABNORMAL LOW (ref 3.5–5.0)
Alkaline Phosphatase: 49 U/L (ref 38–126)
Anion gap: 7 (ref 5–15)
BUN: 53 mg/dL — ABNORMAL HIGH (ref 8–23)
CO2: 22 mmol/L (ref 22–32)
Calcium: 8.7 mg/dL — ABNORMAL LOW (ref 8.9–10.3)
Chloride: 104 mmol/L (ref 98–111)
Creatinine, Ser: 1.18 mg/dL — ABNORMAL HIGH (ref 0.44–1.00)
GFR, Estimated: 46 mL/min — ABNORMAL LOW (ref >60)
Glucose, Bld: 140 mg/dL — ABNORMAL HIGH (ref 70–99)
Potassium: 4.5 mmol/L (ref 3.5–5.1)
Sodium: 133 mmol/L — ABNORMAL LOW (ref 135–145)
Total Bilirubin: 0.6 mg/dL (ref 0.3–1.2)
Total Protein: 5.3 g/dL — ABNORMAL LOW (ref 6.5–8.1)

## 2021-11-06 LAB — GLUCOSE, CAPILLARY
Glucose-Capillary: 138 mg/dL — ABNORMAL HIGH (ref 70–99)
Glucose-Capillary: 177 mg/dL — ABNORMAL HIGH (ref 70–99)
Glucose-Capillary: 205 mg/dL — ABNORMAL HIGH (ref 70–99)
Glucose-Capillary: 249 mg/dL — ABNORMAL HIGH (ref 70–99)

## 2021-11-06 MED ORDER — INSULIN GLARGINE-YFGN 100 UNIT/ML ~~LOC~~ SOLN
33.0000 [IU] | Freq: Every day | SUBCUTANEOUS | Status: DC
Start: 2021-11-07 — End: 2021-11-07
  Filled 2021-11-06: qty 0.33

## 2021-11-06 MED ORDER — IRBESARTAN 75 MG PO TABS
150.0000 mg | ORAL_TABLET | Freq: Every day | ORAL | Status: DC
  Administered 2021-11-07 – 2021-11-11 (×5): 150 mg via ORAL
  Filled 2021-11-06: qty 2
  Filled 2021-11-06 (×5): qty 1

## 2021-11-06 MED ORDER — GERHARDT'S BUTT CREAM
TOPICAL_CREAM | Freq: Two times a day (BID) | CUTANEOUS | Status: DC
  Filled 2021-11-06 (×2): qty 1

## 2021-11-06 NOTE — Progress Notes (Signed)
Inpatient Rehabilitation  Patient information reviewed and entered into eRehab system by Kazuki Ingle Beola Vasallo, OTR/L.   Information including medical coding, functional ability and quality indicators will be reviewed and updated through discharge.    

## 2021-11-06 NOTE — Progress Notes (Signed)
Inpatient Rehabilitation Care Coordinator Assessment and Plan Patient Details  Name: Cassandra Dodson MRN: [id number] Date of Birth: 1940-10-13  Today's Date: 11/06/2021  Hospital Problems: Principal Problem:   Right pontine cerebrovascular accident [facility])  Past Medical History:  Past Medical History:  Diagnosis Date   Arthritis    CKD (chronic kidney disease), stage III ([place])    patient denies   Depression    Diabetes mellitus without complication ([place])    Difficult intravenous access    Endometrial cancer ([place])    GERD (gastroesophageal reflux disease)    Headache    History of radiation therapy 11/04/2019-12/01/2019   Endometrial HDR; Dr. Gery Pray   Hypertension    Hypothyroidism    Neuromuscular disorder ([place])    neuropathy in feet   Osteoarthritis    PMB (postmenopausal bleeding)    PONV (postoperative nausea and vomiting)    severe nausea and vomiting after knee replacement 06-2014, did ok with 2018 knee replacement   Urinary frequency    Wears glasses    Past Surgical History:  Past Surgical History:  Procedure Laterality Date   BREAST SURGERY     cyst removed   CESAREAN SECTION     DILATION AND CURETTAGE OF UTERUS N/A 06/24/2019   Procedure: DILATATION AND CURETTAGE;  Surgeon: Lafonda Mosses, MD;  Location: [place];  Service: Gynecology;  Laterality: N/A;   FRACTURE SURGERY     right knee   INTRAUTERINE DEVICE (IUD) INSERTION N/A 06/24/2019   Procedure: INTRAUTERINE DEVICE (IUD) INSERTION MIRENA;  Surgeon: Lafonda Mosses, MD;  Location: [facility];  Service: Gynecology;  Laterality: N/A;   IR ANGIO INTRA EXTRACRAN SEL COM CAROTID INNOMINATE UNI R MOD SED  11/05/2021   IR ANGIO VERTEBRAL SEL VERTEBRAL UNI R MOD SED  11/05/2021   IR CT HEAD LTD  11/01/2021   IR INTRA CRAN STENT  11/01/2021   IR US GUIDE VASC ACCESS RIGHT  11/01/2021   IRRIGATION AND DEBRIDEMENT SEBACEOUS CYST     JOINT REPLACEMENT Right    right   LYMPH  NODE DISSECTION N/A 09/14/2019   Procedure: LYMPH NODE DISSECTION;  Surgeon: Lafonda Mosses, MD;  Location: [facility];  Service: Gynecology;  Laterality: N/A;   RADIOLOGY WITH ANESTHESIA N/A 11/01/2021   Procedure: ANGIOPLASTY/STENT;  Surgeon: Luanne Bras, MD;  Location: [place];  Service: Radiology;  Laterality: N/A;   ROBOTIC ASSISTED TOTAL HYSTERECTOMY WITH BILATERAL SALPINGO OOPHERECTOMY Bilateral 09/14/2019   Procedure: XI ROBOTIC ASSISTED TOTAL HYSTERECTOMY WITH BILATERAL SALPINGO OOPHORECTOMY;  Surgeon: Lafonda Mosses, MD;  Location[facility];  Service: Gynecology;  Laterality: Bilateral;   SENTINEL NODE BIOPSY N/A 09/14/2019   Procedure: SENTINEL NODE BIOPSY;  Surgeon: Lafonda Mosses, MD;  Location[facility];  Service: Gynecology;  Laterality: N/A;   teeth extration     TONSILLECTOMY     TOTAL HIP ARTHROPLASTY Right 02/23/2015   Procedure: RIGHT TOTAL HIP ARTHROPLASTY ANTERIOR APPROACH;  Surgeon: Leandrew Koyanagi, MD;  Location: [place];  Service: Orthopedics;  Laterality: Right;   TOTAL KNEE ARTHROPLASTY Left 06/27/2016   Procedure: LEFT TOTAL KNEE ARTHROPLASTY;  Surgeon: Leandrew Koyanagi, MD;  Location: [place];  Service: Orthopedics;  Laterality: Left;   Social History:  reports that she quit smoking about 19 years ago. Her smoking use included cigarettes. She has a 67.50 pack-year smoking history. She has never used smokeless tobacco. She reports current alcohol use. She reports that she does not use drugs.  Family / Support Systems Marital Status: Widow/Widower Patient Roles: Parent Children: Luan Pulling [phone] Other Supports: Alicia-daughter in-law [phone] Anticipated Caregiver: [name] he will move in with pt at her apartment at [place] Ability/Limitations of Caregiver: Can physically assist does do landscaping wiht buddy but can provide care to Mom Caregiver Availability: 24/7 Family Dynamics: Close knit with her son and daughter in-law, son is very committed to pt and will do what is  needed for her care.  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 Health Literacy - How often do you need to have someone help you when you read instructions, pamphlets, or other written material from your doctor or pharmacy?: Never Writes: Yes Employment Status: Retired Date Retired/Disabled/Unemployed: [employer] Professor-huminanities, Engineer, agricultural, Warden/ranger Issues: No issues Guardian/Conservator: None-according to MD pt is capable of making her own decisions while here but does have a hearing issues and will make sure son is involved for any decisions while here   Abuse/Neglect Abuse/Neglect Assessment Can Be Completed: Yes Physical Abuse: Denies Verbal Abuse: Denies Sexual Abuse: Denies Exploitation of patient/patient's resources: Denies Self-Neglect: Denies  Patient response to: Social Isolation - How often do you feel lonely or isolated from those around you?: Never  Emotional Status Pt's affect, behavior and adjustment status: Pt is motivated to do well so can have the brain surgery she needs. She has always been independent and taken care of herself she has used a rw after her hip and knee replacement but still lived alone and managed. Son see's her daily and assists with medications and meals Recent Psychosocial Issues: other health issues had endometiral cancer 2021 but thought was in remission Psychiatric History: No history seems to be motivated to do well here and get strong enough to have her next surgery. Son reports she is a very smart woman and has a strong will to do well and regain her independence Substance Abuse History: no issues  Patient / Family Perceptions, Expectations & Goals Pt/Family understanding of illness & functional limitations: Pt and son have a good understanding of her stroke and brain tumor, she is aware of her need to get stronger so can  have her surgery. Son talks with the MD's involved daily and feels has a good understanding of her plan moving forward. He will be with her through this journey Premorbid pt/family roles/activities: Mom, retired professor, Industrial/product designer, church member, etc Anticipated changes in roles/activities/participation: resume Pt/family expectations/goals: Pt states: " I am ready to go home soon, if I am able."  Son states: " I will be staying with her and plan to for as long as she needs me too and once I feel she is ready for me to leave her alone."  [employer]: None Premorbid Home Care/DME Agencies: Other (Comment) (rw, tub seat) Transportation available at discharge: son Is the patient able to respond to transportation needs?: Yes In the past 12 months, has lack of transportation kept you from medical appointments or from getting medications?: No In the past 12 months, has lack of transportation kept you from meetings, work, or from getting things needed for daily living?: No  Discharge Planning Living Arrangements: Alone Support Systems: Children, Other relatives, Church/faith community Type of Residence: Private residence Insurance Resources: Multimedia programmer (specify) Primary school teacher) Financial Resources: Fish farm manager, Other (Comment) (pension) Living Expenses: Rent Money Management: Family Does the patient have any problems obtaining your medications?: No Home Management: son and pt with what  she can do-son does meals and manages her medications Patient/Family Preliminary Plans: Return back to her apartment with son planning on staying with her to assist with her care. He is aware she probably will need 24/7 care at discharge. he will be here 2-3 timse per day to see her and wants to be kept updated on her medical issues and care. Care Coordinator Barriers to Discharge: Insurance for SNF coverage Care Coordinator Anticipated Follow Up Needs: HH/OP  Clinical  Impression Pleasant very HOH female who wants to go home soon but is aware needs to get stronger to have her brain surgery. Her son is very involved and will be providing 24/7 care at discharge. Aware being evaluated today and goals being set. Will update both after team conference tomorrow  Elease Hashimoto 11/06/2021, 9:37 AM

## 2021-11-06 NOTE — Evaluation (Signed)
Speech Language Pathology Assessment and Plan  Patient Details  Name: Cassandra Dodson MRN: [id number] Date of Birth: [birth year]  SLP Diagnosis: Cognitive Impairments;Dysarthria;Dysphagia  Rehab Potential: Good ELOS: 2-2.5 weeks   Today's Date: 11/06/2021 SLP Individual Time: 1607-3710 SLP Individual Time Calculation (min): 60 min  Hospital Problem: Principal Problem:   Right pontine cerebrovascular accident [facility])  Past Medical History:  Past Medical History:  Diagnosis Date   Arthritis    CKD (chronic kidney disease), stage III ([place])    patient denies   Depression    Diabetes mellitus without complication ([place])    Difficult intravenous access    Endometrial cancer ([place])    GERD (gastroesophageal reflux disease)    Headache    History of radiation therapy 11/04/2019-12/01/2019   Endometrial HDR; Dr. Gery Dodson   Hypertension    Hypothyroidism    Neuromuscular disorder ([place])    neuropathy in feet   Osteoarthritis    PMB (postmenopausal bleeding)    PONV (postoperative nausea and vomiting)    severe nausea and vomiting after knee replacement 06-2014, did ok with 2018 knee replacement   Urinary frequency    Wears glasses    Past Surgical History:  Past Surgical History:  Procedure Laterality Date   BREAST SURGERY     cyst removed   CESAREAN SECTION     DILATION AND CURETTAGE OF UTERUS N/A 06/24/2019   Procedure: DILATATION AND CURETTAGE;  Surgeon: Cassandra Mosses, MD;  Location: [place];  Service: Gynecology;  Laterality: N/A;   FRACTURE SURGERY     right knee   INTRAUTERINE DEVICE (IUD) INSERTION N/A 06/24/2019   Procedure: INTRAUTERINE DEVICE (IUD) INSERTION MIRENA;  Surgeon: Cassandra Mosses, MD;  Location: [facility];  Service: Gynecology;  Laterality: N/A;   IR ANGIO INTRA EXTRACRAN SEL COM CAROTID INNOMINATE UNI R MOD SED  11/05/2021   IR ANGIO VERTEBRAL SEL VERTEBRAL UNI R MOD SED  11/05/2021   IR CT HEAD LTD  11/01/2021   IR  INTRA CRAN STENT  11/01/2021   IR US GUIDE VASC ACCESS RIGHT  11/01/2021   IRRIGATION AND DEBRIDEMENT SEBACEOUS CYST     JOINT REPLACEMENT Right    right   LYMPH NODE DISSECTION N/A 09/14/2019   Procedure: LYMPH NODE DISSECTION;  Surgeon: Cassandra Mosses, MD;  [facility];  Service: Gynecology;  Laterality: N/A;   RADIOLOGY WITH ANESTHESIA N/A 11/01/2021   Procedure: ANGIOPLASTY/STENT;  Surgeon: Cassandra Bras, MD;  Location: [place];  Service: Radiology;  Laterality: N/A;   ROBOTIC ASSISTED TOTAL HYSTERECTOMY WITH BILATERAL SALPINGO OOPHERECTOMY Bilateral 09/14/2019   Procedure: XI ROBOTIC ASSISTED TOTAL HYSTERECTOMY WITH BILATERAL SALPINGO OOPHORECTOMY;  Surgeon: Cassandra Mosses, MD;  [facility];  Service: Gynecology;  Laterality: Bilateral;   SENTINEL NODE BIOPSY N/A 09/14/2019   Procedure: SENTINEL NODE BIOPSY;  Surgeon: Cassandra Mosses, MD;  [facility];  Service: Gynecology;  Laterality: N/A;   teeth extration     TONSILLECTOMY     TOTAL HIP ARTHROPLASTY Right 02/23/2015   Procedure: RIGHT TOTAL HIP ARTHROPLASTY ANTERIOR APPROACH;  Surgeon: Cassandra Koyanagi, MD;  Location: [place];  Service: Orthopedics;  Laterality: Right;   TOTAL KNEE ARTHROPLASTY Left 06/27/2016   Procedure: LEFT TOTAL KNEE ARTHROPLASTY;  Surgeon: Cassandra Koyanagi, MD;  Location: [place];  Service: Orthopedics;  Laterality: Left;    Assessment / Plan / Recommendation Clinical Impression Cassandra Dodson is an 81 year old right-handed female history of endometrial cancer,  hypertension, type 2 diabetes mellitus, hypothyroidism, CKD stage III, quit smoking 19 years ago, history of left hip and bilateral knee replacement.  Per chart review patient lives alone.  1 level apartment.  She does have a son in the area.  Used a rolling walker prior to admission.  She does have an aide 2-3 days a week.  Presented 10/25/2021 to [place] ED with acute onset of slurred speech and left-sided weakness x2 days.  CT/MRI showed  large acute right paramedian pontine infarction.  Anterior cranial fossa meningioma with large amount of surrounding vasogenic edema in the inferior left frontal lobe.  Rightward midline shift with subfalcine herniation of the left cingulate gyrus. Given high risk of recurrent stroke with devastating outcome patient underwent basilar artery stenting assisted angioplasty 7/13 per interventional radiology. Neurosurgery follow-up in regards to left frontal large meningioma Dr. Ellene Dodson plan for possible surgical resection 8-12 weeks. She is currently on a dysphagia #3 nectar thick liquid diet.  Therapy evaluations completed due to patient decreased functional mobility was admitted for a comprehensive rehab program.  Patient presents with cognitive-linguistic deficits in the areas of orientation, attention, short-term recall, and intellectual awareness. Difficult to differentiate cognitive deficits attributed to stroke vs. meningioma, as well as anticipate level of cognitive recovery with presence of meningioma. Per son, plans to surgically remove meningioma will not occur until after admission. Nevertheless, plan to address to increase safety and independence prior to discharge.  Pt presents with a mild dysarthria characterized by articulatory imprecision impacting speech intelligibility at the conversation level. Oral mechanism exam remarkable for left sided facial weakness. Pt had difficulty executing oral motor movements. Son reported vocal quality has returned to baseline and was initially affected likely secondary to intubation.   Bedside swallow exam completed. Pt consumed single and sequential sips of nectar thick liquids by cup without overt s/sx of aspiration and clear vocal quality post swallows, but required at least min A verbal cues to reduce speed of consumption and bolus volume. Consumption of dys 1 textures was Cassandra Dodson; pt presented with adequate mastication of dys 3 textures with mild oral residuals  most notable in left lateral sulcus. Pt demonstrated awareness and initiated lingual sweep. Recommend continuation of dysphagia 3 textures with nectar thick liquids and full supervision with intake. Nurse reported tolerance of whole pills in applesauce this morning.   Skilled SLP intervention is recommended to address deficits in the areas of cognitive-linguistic, speech, and swallowing to increase safety, independence, and reduce care partner burden.    Skilled Therapeutic Interventions          Assessments of swallow, cognitive-linguistic, speech, and language function. Please see above.     SLP Assessment  Patient will need skilled Speech Lanaguage Pathology Services during CIR admission    Recommendations  SLP Diet Recommendations: Dysphagia 3 (Mech soft);Nectar Liquid Administration via: Cup Medication Administration: Whole meds with puree Supervision: Full supervision/cueing for compensatory strategies Compensations: Slow rate;Small sips/bites;Lingual sweep for clearance of pocketing Postural Changes and/or Swallow Maneuvers: Seated upright 90 degrees Oral Care Recommendations: Oral care BID Patient destination: Home Follow up Recommendations: 24 hour supervision/assistance;Home Health SLP Equipment Recommended: To be determined    SLP Frequency 3 to 5 out of 7 days   SLP Duration  SLP Intensity  SLP Treatment/Interventions 2-2.5 weeks  Minumum of 1-2 x/day, 30 to 90 minutes  Cognitive remediation/compensation;Dysphagia/aspiration precaution training;Functional tasks;Patient/family education;Speech/Language facilitation;Therapeutic Activities    Pain Pain Assessment Pain Score: 0-No pain Pain Type: Acute pain (Denied pain, but wincing  with right leg pain when applying teds, saying ouch  indicating feet hurt during transfer) Pain Location: Foot  Prior Functioning Type of Home: Apartment (4th floor. Elevator access)  Lives With: Alone Available Help at Discharge:  Family;Available 24 hours/day Vocation: Retired  Programmer, systems Overall Cognitive Status: Impaired/Different from baseline Arousal/Alertness: Awake/alert Orientation Level: Oriented to person;Oriented to place Month: July Day of Week: Incorrect Attention: Sustained Focused Attention: Appears intact Focused Attention Impairment: Functional basic Sustained Attention: Impaired Sustained Attention Impairment: Functional basic;Verbal basic Memory: Impaired Memory Impairment: Retrieval deficit;Decreased short term memory;Decreased recall of new information Decreased Short Term Memory: Functional basic;Verbal basic Awareness: Impaired Awareness Impairment: Intellectual impairment Problem Solving: Impaired Problem Solving Impairment: Functional basic;Verbal basic Safety/Judgment: Impaired  Comprehension Auditory Comprehension Overall Auditory Comprehension: Appears within functional limits for tasks assessed Expression Expression Primary Mode of Expression: Verbal Verbal Expression Overall Verbal Expression: Appears within functional limits for tasks assessed Oral Motor Oral Motor/Sensory Function Overall Oral Motor/Sensory Function: Mild impairment (Pt exhibited difficulty executing oral motor movements) Facial ROM: Reduced left;Suspected CN VII (facial) dysfunction Facial Symmetry: Abnormal symmetry left;Suspected CN VII (facial) dysfunction Facial Strength: Within Functional Limits Facial Sensation: Within Functional Limits Lingual ROM: Reduced right;Reduced left Lingual Symmetry: Within Functional Limits Lingual Strength: Other (Comment) (difficult to assess) Velum: Within Functional Limits Mandible: Within Functional Limits Motor Speech Overall Motor Speech: Impaired Respiration: Within functional limits Phonation: Normal Resonance: Within functional limits Articulation: Impaired Level of Impairment: Sentence Intelligibility: Intelligible Motor Planning:  Witnin functional limits Effective Techniques: Slow rate  Care Tool Care Tool Cognition Ability to hear (with hearing aid or hearing appliances if normally used Ability to hear (with hearing aid or hearing appliances if normally used): 2. Moderate difficulty - speaker has to increase volume and speak distinctly   Expression of Ideas and Wants Expression of Ideas and Wants: 3. Some difficulty - exhibits some difficulty with expressing needs and ideas (e.g, some words or finishing thoughts) or speech is not clear   Understanding Verbal and Non-Verbal Content Understanding Verbal and Non-Verbal Content: 3. Usually understands - understands most conversations, but misses some part/intent of message. Requires cues at times to understand  Memory/Recall Ability Memory/Recall Ability : Current season;That he or she is in a hospital/hospital unit   PMSV Assessment  PMSV Trial Intelligibility: Intelligible  Bedside Swallowing Assessment General Date of Onset: 10/25/21 Previous Swallow Assessment: FEES 7/8 Diet Prior to this Study: Dysphagia 3 (soft);Nectar-thick liquids Temperature Spikes Noted: No Respiratory Status: Room air History of Recent Intubation: Yes Length of Intubations (days): 1 days Date extubated: 11/02/21 Behavior/Cognition: Alert;Cooperative;Pleasant mood Oral Cavity - Dentition: Missing dentition;Other (Comment) (son plans to bring dentures) Self-Feeding Abilities: Needs assist Vision: Functional for self-feeding Patient Positioning: Upright in bed Baseline Vocal Quality: Normal Volitional Cough: Strong Volitional Swallow: Unable to elicit  Oral Care Assessment Oral Assessment  (WDL): Exceptions to WDL Lips: Asymmetrical Teeth: Missing (Comment) Tongue: Pink;Moist Mucous Membrane(s): Pink Saliva: Moist, saliva free flowing Level of Consciousness: Alert Is patient on any of following O2 devices?: None of the above Nutritional status: Dysphagia Oral Assessment Risk :  High Risk Ice Chips Ice chips: Not tested Thin Liquid Thin Liquid: Not tested Nectar Thick Nectar Thick Liquid: Within functional limits Presentation: Cup Honey Thick Honey Thick Liquid: Not tested Puree Puree: Within functional limits Solid Solid: Impaired Presentation: Self Fed Oral Phase Impairments: Impaired mastication Oral Phase Functional Implications: Prolonged oral transit;Oral residue;Left lateral sulci pocketing BSE Assessment Risk for Aspiration Impact on safety and function: Mild aspiration  risk;Moderate aspiration risk Other Related Risk Factors: Cognitive impairment  Short Term Goals: Week 1: SLP Short Term Goal 1 (Week 1): Patient will consume current diet with minimal overt s/sx of aspiration and with min A verbal cues to implement small bites/sips, single sips. SLP Short Term Goal 2 (Week 1): Patient will consume PO trials of ice chips and/or thin liquids with minimal overt s/sx of aspiration to indicate readiness for instrumental swallow evaluation. SLP Short Term Goal 3 (Week 1): Patient will orient x4 with mod A for use of external aids SLP Short Term Goal 4 (Week 1): Patient will improve intellectual awareness by identifying up to 3 changes (e.g., cognitive, speech, swallowing, physical)with mod A verbal and contextual cues SLP Short Term Goal 5 (Week 1): Patient will demonstrate functional recall by using internal/external memory strategies/aids with mod A verbal/visual cues SLP Short Term Goal 6 (Week 1): Patient will utilize speech intelligibility strategies at the sentence level with min A verbal cues to achieve >90% intelligibility  Refer to Care Plan for Long Term Goals  Recommendations for other services: None   Discharge Criteria: Patient will be discharged from SLP if patient refuses treatment 3 consecutive times without medical reason, if treatment goals not met, if there is a change in medical status, if patient makes no progress towards goals or if  patient is discharged from hospital.  The above assessment, treatment plan, treatment alternatives and goals were discussed and mutually agreed upon: by patient and by family  Patty Sermons 11/06/2021, 4:03 PM

## 2021-11-06 NOTE — Plan of Care (Signed)
  Problem: RH Swallowing Goal: LTG Patient will consume least restrictive diet using compensatory strategies with assistance (SLP) Description: LTG:  Patient will consume least restrictive diet using compensatory strategies with assistance (SLP) Flowsheets (Taken 11/06/2021 1620) LTG: Pt Patient will consume least restrictive diet using compensatory strategies with assistance of (SLP): Supervision Goal: LTG Patient will participate in dysphagia therapy to increase swallow function with assistance (SLP) Description: LTG:  Patient will participate in dysphagia therapy to increase swallow function with assistance (SLP) Flowsheets (Taken 11/06/2021 1620) LTG: Pt will participate in dysphagia therapy to increase swallow function with assistance of (SLP): Supervision Goal: LTG Pt will demonstrate functional change in swallow as evidenced by bedside/clinical objective assessment (SLP) Description: LTG: Patient will demonstrate functional change in swallow as evidenced by bedside/clinical objective assessment (SLP) Flowsheets (Taken 11/06/2021 1620) LTG: Patient will demonstrate functional change in swallow as evidenced by bedside/clinical objective assessment: Oropharyngeal swallow   Problem: RH Cognition - SLP Goal: RH LTG Patient will demonstrate orientation with cues Description:  LTG:  Patient will demonstrate orientation to person/place/time/situation with cues (SLP)   Flowsheets (Taken 11/06/2021 1620) LTG Patient will demonstrate orientation to:  Person  Place  Time  Situation LTG: Patient will demonstrate orientation using cueing (SLP): Minimal Assistance - Patient > 75%   Problem: RH Expression Communication Goal: LTG Patient will increase speech intelligibility (SLP) Description: LTG: Patient will increase speech intelligibility at word/phrase/conversation level with cues, % of the time (SLP) Flowsheets (Taken 11/06/2021 1620) LTG: Patient will increase speech intelligibility (SLP):  Supervision Level: Conversation level   Problem: RH Problem Solving Goal: LTG Patient will demonstrate problem solving for (SLP) Description: LTG:  Patient will demonstrate problem solving for basic/complex daily situations with cues  (SLP) Flowsheets (Taken 11/06/2021 1620) LTG: Patient will demonstrate problem solving for (SLP): Basic daily situations LTG Patient will demonstrate problem solving for: Minimal Assistance - Patient > 75%   Problem: RH Memory Goal: LTG Patient will use memory compensatory aids to (SLP) Description: LTG:  Patient will use memory compensatory aids to recall biographical/new, daily complex information with cues (SLP) Flowsheets (Taken 11/06/2021 1620) LTG: Patient will use memory compensatory aids to (SLP): Minimal Assistance - Patient > 75%   Problem: RH Awareness Goal: LTG: Patient will demonstrate awareness during functional activites type of (SLP) Description: LTG: Patient will demonstrate awareness during functional activites type of (SLP) Flowsheets (Taken 11/06/2021 1620) Patient will demonstrate during cognitive/linguistic activities awareness type of:  Intellectual  Emergent LTG: Patient will demonstrate awareness during cognitive/linguistic activities with assistance of (SLP): Minimal Assistance - Patient > 75%

## 2021-11-06 NOTE — Progress Notes (Signed)
Pt blood pressure dropped during therapy transfer, patient non responsive briefly, pt aroused and awake and oriented to self per pt baseline. Ted hose applied to patient. Pt placed back into bed. No other complications noted. MD made aware Sheela Stack, LPN   96/88/64 8472 11/09/80 1148  Vitals  BP (!) 98/55 (!) 111/59  MAP (mmHg) 67 76  BP Method Automatic Automatic  Pulse Rate 64 65  Pulse Rate Source Monitor Monitor  MEWS COLOR  MEWS Score Color Green Green  Oxygen Therapy  SpO2 97 % 96 %

## 2021-11-06 NOTE — Progress Notes (Signed)
PROGRESS NOTE   Subjective/Complaints: Eating breakfast- finished most of plate Hard of hearing Has no complaints Discussed elevated Cbgs and increasing her insulin.   ROS: +hard of hearing   Objective:   No results found. Recent Labs    11/05/21 0235 11/06/21 0545  WBC 9.1 8.2  HGB 12.7 11.9*  HCT 38.1 34.9*  PLT 167 163   Recent Labs    11/05/21 0235 11/06/21 0545  NA 133* 133*  K 5.0 4.5  CL 104 104  CO2 21* 22  GLUCOSE 206* 140*  BUN 52* 53*  CREATININE 1.45* 1.18*  CALCIUM 8.7* 8.7*    Intake/Output Summary (Last 24 hours) at 11/06/2021 1124 Last data filed at 11/05/2021 1753 Gross per 24 hour  Intake 480 ml  Output --  Net 480 ml     Pressure Injury 11/01/21 Sacrum Mid Stage 1 -  Intact skin with non-blanchable redness of a localized area usually over a bony prominence. (Active)  11/01/21 1750  Location: Sacrum  Location Orientation: Mid  Staging: Stage 1 -  Intact skin with non-blanchable redness of a localized area usually over a bony prominence.  Wound Description (Comments):   Present on Admission: Yes    Physical Exam: Vital Signs Blood pressure (!) 192/90, pulse 84, temperature 98.3 F (36.8 C), temperature source Oral, resp. rate 18, height '5\' 5"'$  (1.651 m), weight 81 kg, SpO2 100 %. Gen: no distress, normal appearing HEENT: oral mucosa pink and moist, NCAT, hard of hearing Cardio: Reg rate Chest: normal effort, normal rate of breathing Abd: soft, non-distended Ext: no edema Psych: pleasant, normal affect Skin: intact Neurological:     Comments: Patient is alert.  Mild dysarthria.  Oriented to self and place.  Follows simple commands.  Limited overall medical historian. Cranial nerves II through XII intact, motor strength is 5/5 in RIght and 4- Left deltoid, bicep, tricep, grip,5/5 Right and 2- left  hip flexor, knee extensors, ankle dorsiflexor and plantar flexor Sensory exam  normal sensation to light touch and proprioception in bilateral upper and lower extremities   Assessment/Plan: 1. Functional deficits which require 3+ hours per day of interdisciplinary therapy in a comprehensive inpatient rehab setting. Physiatrist is providing close team supervision and 24 hour management of active medical problems listed below. Physiatrist and rehab team continue to assess barriers to discharge/monitor patient progress toward functional and medical goals  Care Tool:  Bathing              Bathing assist       Upper Body Dressing/Undressing Upper body dressing        Upper body assist      Lower Body Dressing/Undressing Lower body dressing            Lower body assist       Toileting Toileting    Toileting assist Assist for toileting: 2 Helpers     Transfers Chair/bed transfer  Transfers assist  Chair/bed transfer activity did not occur: Safety/medical concerns        Locomotion Ambulation   Ambulation assist              Walk 10 feet activity   Assist  Walk 50 feet activity   Assist           Walk 150 feet activity   Assist           Walk 10 feet on uneven surface  activity   Assist           Wheelchair     Assist               Wheelchair 50 feet with 2 turns activity    Assist            Wheelchair 150 feet activity     Assist          Blood pressure (!) 192/90, pulse 84, temperature 98.3 F (36.8 C), temperature source Oral, resp. rate 18, height '5\' 5"'$  (1.651 m), weight 81 kg, SpO2 100 %.    Medical Problem List and Plan: 1. 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  Antithrombotics: -DVT/anticoagulation: Lovenox             -antiplatelet therapy:  Aspirin 81 mg daily and Plavix 75 mg day x3 to 6 months 3. Pain Management: Tylenol as needed 4. Mood/Behavior/Sleep: Xanax 0.25-0.5 mg nightly as needed             -antipsychotic agents: N/A 5. Neuropsych/cognition: This patient is capable of making decisions on her own behalf. 6. Skin/Wound Care: Routine skin checks 7. Fluids/Electrolytes/Nutrition: Routine in and outs with follow-up chemistries 8.  Left frontal large meningioma.  Follow-up neurosurgery Dr. Ellene Route.  Plan for possible surgical resection 8-12 weeks.  Continue Decadron therapy. 9.  Hypertension.  Norvasc 10 mg daily, increase Avapro to 150 mg daily.  Monitor with increased mobility 10.  Diabetes mellitus.  Hemoglobin A1c 7.0.  Increase Semglee to 33 units daily.  Monitor while on Decadron therapy 11.  Hyperlipidemia.  Fenofibrate/Crestor 12.  CKD stage III.  Latest creatinine 1.45.  Baseline 1.22-1.69.  Cr at baseline, monitor weekly. 13.  History of endometrial cancer.  Follow-up outpatient 14.  GERD.  Protonix     LOS: 1 days A FACE TO FACE EVALUATION WAS PERFORMED  Martha Clan P Chisom Aust 11/06/2021, 11:24 AM

## 2021-11-06 NOTE — Plan of Care (Signed)
  Problem: RH Balance Goal: LTG Patient will maintain dynamic sitting balance (PT) Description: LTG:  Patient will maintain dynamic sitting balance with assistance during mobility activities (PT) Flowsheets (Taken 11/06/2021 1424) LTG: Pt will maintain dynamic sitting balance during mobility activities with:: Supervision/Verbal cueing Goal: LTG Patient will maintain dynamic standing balance (PT) Description: LTG:  Patient will maintain dynamic standing balance with assistance during mobility activities (PT) Flowsheets (Taken 11/06/2021 1424) LTG: Pt will maintain dynamic standing balance during mobility activities with:: Minimal Assistance - Patient > 75%   Problem: Sit to Stand Goal: LTG:  Patient will perform sit to stand with assistance level (PT) Description: LTG:  Patient will perform sit to stand with assistance level (PT) Flowsheets (Taken 11/06/2021 1424) LTG: PT will perform sit to stand in preparation for functional mobility with assistance level: Minimal Assistance - Patient > 75%   Problem: RH Bed Mobility Goal: LTG Patient will perform bed mobility with assist (PT) Description: LTG: Patient will perform bed mobility with assistance, with/without cues (PT). Flowsheets (Taken 11/06/2021 1424) LTG: Pt will perform bed mobility with assistance level of: Minimal Assistance - Patient > 75%   Problem: RH Bed to Chair Transfers Goal: LTG Patient will perform bed/chair transfers w/assist (PT) Description: LTG: Patient will perform bed to chair transfers with assistance (PT). Flowsheets (Taken 11/06/2021 1424) LTG: Pt will perform Bed to Chair Transfers with assistance level: Minimal Assistance - Patient > 75%   Problem: RH Car Transfers Goal: LTG Patient will perform car transfers with assist (PT) Description: LTG: Patient will perform car transfers with assistance (PT). Flowsheets (Taken 11/06/2021 1424) LTG: Pt will perform car transfers with assist:: Minimal Assistance - Patient >  75%   Problem: RH Ambulation Goal: LTG Patient will ambulate in controlled environment (PT) Description: LTG: Patient will ambulate in a controlled environment, # of feet with assistance (PT). Flowsheets (Taken 11/06/2021 1424) LTG: Pt will ambulate in controlled environ  assist needed:: Minimal Assistance - Patient > 75% LTG: Ambulation distance in controlled environment: 4f Goal: LTG Patient will ambulate in home environment (PT) Description: LTG: Patient will ambulate in home environment, # of feet with assistance (PT). Flowsheets (Taken 11/06/2021 1424) LTG: Pt will ambulate in home environ  assist needed:: Minimal Assistance - Patient > 75% LTG: Ambulation distance in home environment: 16f  Problem: RH Wheelchair Mobility Goal: LTG Patient will propel w/c in controlled environment (PT) Description: LTG: Patient will propel wheelchair in controlled environment, # of feet with assist (PT) Flowsheets (Taken 11/06/2021 1424) LTG: Pt will propel w/c in controlled environ  assist needed:: Supervision/Verbal cueing LTG: Propel w/c distance in controlled environment: [id number]: LTG Patient will propel w/c in home environment (PT) Description: LTG: Patient will propel wheelchair in home environment, # of feet with assistance (PT). Flowsheets (Taken 11/06/2021 1424) LTG: Pt will propel w/c in home environ  assist needed:: Supervision/Verbal cueing LTG: Propel w/c distance in home environment: [id number]

## 2021-11-06 NOTE — Evaluation (Signed)
Physical Therapy Assessment and Plan  Patient Details  Name: Cassandra Dodson MRN: [id number] Date of Birth: [birth year]  PT Diagnosis: Abnormal posture, Abnormality of gait, Cognitive deficits, Difficulty walking, Hemiparesis non-dominant, Impaired cognition, and Muscle weakness Rehab Potential: Fair ELOS: 2-2.5 weeks   Today's Date: 11/06/2021 PT Individual Time: 1330-1440 PT Individual Time Calculation (min): 70 min    Hospital Problem: Principal Problem:   Right pontine cerebrovascular accident [facility])   Past Medical History:  Past Medical History:  Diagnosis Date   Arthritis    CKD (chronic kidney disease), stage III ([place])    patient denies   Depression    Diabetes mellitus without complication ([facility])    Difficult intravenous access    Endometrial cancer ([place])    GERD (gastroesophageal reflux disease)    Headache    History of radiation therapy 11/04/2019-12/01/2019   Endometrial HDR; Dr. Gery Pray   Hypertension    Hypothyroidism    Neuromuscular disorder ([place])    neuropathy in feet   Osteoarthritis    PMB (postmenopausal bleeding)    PONV (postoperative nausea and vomiting)    severe nausea and vomiting after knee replacement 06-2014, did ok with 2018 knee replacement   Urinary frequency    Wears glasses    Past Surgical History:  Past Surgical History:  Procedure Laterality Date   BREAST SURGERY     cyst removed   CESAREAN SECTION     DILATION AND CURETTAGE OF UTERUS N/A 06/24/2019   Procedure: DILATATION AND CURETTAGE;  Surgeon: Lafonda Mosses, MD;  Location: [place];  Service: Gynecology;  Laterality: N/A;   FRACTURE SURGERY     right knee   INTRAUTERINE DEVICE (IUD) INSERTION N/A 06/24/2019   Procedure: INTRAUTERINE DEVICE (IUD) INSERTION MIRENA;  Surgeon: Lafonda Mosses, MD;  Location: [facility];  Service: Gynecology;  Laterality: N/A;   IR ANGIO INTRA EXTRACRAN SEL COM CAROTID INNOMINATE UNI R MOD SED  11/05/2021    IR ANGIO VERTEBRAL SEL VERTEBRAL UNI R MOD SED  11/05/2021   IR CT HEAD LTD  11/01/2021   IR INTRA CRAN STENT  11/01/2021   IR US GUIDE VASC ACCESS RIGHT  11/01/2021   IRRIGATION AND DEBRIDEMENT SEBACEOUS CYST     JOINT REPLACEMENT Right    right   LYMPH NODE DISSECTION N/A 09/14/2019   Procedure: LYMPH NODE DISSECTION;  Surgeon: Lafonda Mosses, MD;  Location: [facility];  Service: Gynecology;  Laterality: N/A;   RADIOLOGY WITH ANESTHESIA N/A 11/01/2021   Procedure: ANGIOPLASTY/STENT;  Surgeon: Luanne Bras, MD;  Location: [place];  Service: Radiology;  Laterality: N/A;   ROBOTIC ASSISTED TOTAL HYSTERECTOMY WITH BILATERAL SALPINGO OOPHERECTOMY Bilateral 09/14/2019   Procedure: XI ROBOTIC ASSISTED TOTAL HYSTERECTOMY WITH BILATERAL SALPINGO OOPHORECTOMY;  Surgeon: Lafonda Mosses, MD;  Location: [facility];  Service: Gynecology;  Laterality: Bilateral;   SENTINEL NODE BIOPSY N/A 09/14/2019   Procedure: SENTINEL NODE BIOPSY;  Surgeon: Lafonda Mosses, MD;  Location: [facility];  Service: Gynecology;  Laterality: N/A;   teeth extration     TONSILLECTOMY     TOTAL HIP ARTHROPLASTY Right 02/23/2015   Procedure: RIGHT TOTAL HIP ARTHROPLASTY ANTERIOR APPROACH;  Surgeon: Leandrew Koyanagi, MD;  Location: [place];  Service: Orthopedics;  Laterality: Right;   TOTAL KNEE ARTHROPLASTY Left 06/27/2016   Procedure: LEFT TOTAL KNEE ARTHROPLASTY;  Surgeon: Leandrew Koyanagi, MD;  Location: [place];  Service: Orthopedics;  Laterality: Left;    Assessment & Plan Clinical  Impression: Patient is an 81 year old right-handed female history of endometrial cancer, hypertension, type 2 diabetes mellitus, hypothyroidism, CKD stage III, quit smoking 19 years ago, history of left hip and bilateral knee replacement.  Per chart review patient lives alone.  1 level apartment.  She does have a son in the area.  Used a rolling walker prior to admission.  She does have an aide 2-3 days a week.  Presented 10/25/2021 to [facility] with acute  onset of slurred speech and left-sided weakness x2 days.  CT/MRI showed large acute right paramedian pontine infarction.  Anterior cranial fossa meningioma with large amount of surrounding vasogenic edema in the inferior left frontal lobe.  Rightward midline shift with subfalcine herniation of the left cingulate gyrus.  CT angiogram head and neck showed no large vessel occlusion.  Plaque at the ICA origin causing less than 50% stenosis.  Marked stenosis of BA proximal to SCA's.  Given high risk of recurrent stroke with devastating outcome patient underwent basilar artery stenting assisted angioplasty 7/13 per interventional radiology.  Initially placed on intravenous heparin since transition to aspirin 81 mg daily and Plavix 75 mg daily x3 to 6 months.  Neurosurgery follow-up in regards to left frontal large meningioma Dr. Ellene Route plan for possible surgical resection 8-12 weeks.  Patient was placed on Decadron therapy.  Lovenox for DVT prophylaxis.  Blood pressure monitored initially maintained on Cleviprex.  She is currently on a dysphagia #3 nectar thick liquid diet.  Therapy evaluations completed due to patient decreased functional mobility was admitted for a comprehensive rehab program. Patient transferred to [facility] on 11/05/2021 .   Patient currently requires max with mobility secondary to muscle weakness, decreased cardiorespiratoy endurance, motor apraxia and decreased coordination, decreased visual acuity, decreased visual perceptual skills, and decreased visual motor skills, decreased initiation, decreased attention, decreased awareness, decreased problem solving, decreased safety awareness, decreased memory, and delayed processing, and decreased sitting balance, decreased standing balance, decreased postural control, hemiplegia, and decreased balance strategies.  Prior to hospitalization, patient was modified independent  with mobility and lived with Alone in a Apartment (4th floor. Elevator access) home.   Home access is  Level entry.  Patient will benefit from skilled PT intervention to maximize safe functional mobility, minimize fall risk, and decrease caregiver burden for planned discharge home with 24 hour assist.  Anticipate patient will benefit from follow up [facility] at discharge.  PT - End of Session Activity Tolerance: Tolerates < 10 min activity, no significant change in vital signs Endurance Deficit: Yes Endurance Deficit Description: Extended seated rest breaks required during simple functional mobility tasks PT Assessment Rehab Potential (ACUTE/IP ONLY): Fair PT Barriers to Discharge: Decreased caregiver support;Lack of/limited family support;Insurance for SNF coverage;Weight;Incontinence PT Patient demonstrates impairments in the following area(s): Balance;Endurance;Motor;Safety;Skin Integrity PT Transfers Functional Problem(s): Bed Mobility;Bed to Chair;Car PT Locomotion Functional Problem(s): Ambulation;Stairs PT Plan PT Intensity: Minimum of 1-2 x/day ,45 to 90 minutes PT Frequency: 5 out of 7 days PT Duration Estimated Length of Stay: 2-2.5 weeks PT Treatment/Interventions: Ambulation/gait training;Functional mobility training;Discharge planning;Psychosocial support;Therapeutic Activities;Visual/perceptual remediation/compensation;Wheelchair propulsion/positioning;Therapeutic Exercise;Skin care/wound management;Neuromuscular re-education;Disease management/prevention;Balance/vestibular training;Cognitive remediation/compensation;DME/adaptive equipment instruction;Pain management;Splinting/orthotics;UE/LE Strength taining/ROM;Stair training;UE/LE Coordination activities;Patient/family education;Functional electrical stimulation;Community reintegration PT Transfers Anticipated Outcome(s): minA with LRAD PT Locomotion Anticipated Outcome(s): minA with LRAD. PT Recommendation Follow Up Recommendations: Home health PT;24 hour supervision/assistance Patient destination: Home Equipment  Recommended: To be determined Equipment Details: Pt owns RW   PT Evaluation Precautions/Restrictions Precautions Precautions: Fall Precaution Comments: incontinent, contact precautions, Left hemi, watch  BP Restrictions Weight Bearing Restrictions: No General   Vital SignsTherapy Vitals Pulse Rate: 65 BP: (!) 98/55 Patient Position (if appropriate): Sitting Oxygen Therapy SpO2: 95 % O2 Device: Room Air Pain Pain Assessment Pain Score: 0-No pain Pain Type: Acute pain (Denied pain, but wincing with right leg pain when applying teds, saying ouch  indicating feet hurt during transfer) Pain Location: Foot Pain Interference Pain Interference Pain Effect on Sleep: 0. Does not apply - I have not had any pain or hurting in the past 5 days Pain Interference with Therapy Activities: 0. Does not apply - I have not received rehabilitationtherapy in the past 5 days Pain Interference with Day-to-Day Activities: 1. Rarely or not at all Home Living/Prior [place] Available Help at Discharge: Family;Available 24 hours/day Type of Home: Apartment (4th floor. Elevator access) Home Access: Level entry Home Layout: One level Bathroom Shower/Tub: Chiropodist: Standard Bathroom Accessibility: Yes Additional Comments: Per prior interview - patient unable to fully answer questions and no family present  Lives With: Alone Prior Function Level of Independence: Needs assistance with homemaking;Requires assistive device for independence;Independent with basic ADLs (Has HHA 2-3days/wk that primarily assisted with homemaking (laundry, cleaning, etc))  Able to Take Stairs?: Yes Driving: Yes Vocation: Retired Vision/Perception  Vision - History Ability to See in Adequate Light: 0 Adequate Vision - Assessment Eye Alignment: Within Functional Limits Ocular Range of Motion: Restricted on the right;Impaired-to be further tested in functional context Alignment/Gaze  Preference: Within Defined Limits Tracking/Visual Pursuits: Right eye does not track medially;Decreased smoothness of horizontal tracking;Impaired - to be further tested in functional context Saccades: Additional head turns occurred during testing;Impaired - to be further tested in functional context Convergence: Impaired (comment) Additional Comments: Patient with decreased arousal - need further assessment Perception Perception: Impaired Inattention/Neglect: Does not attend to left side of body Spatial Orientation: Decreased midline awareness Praxis Praxis: Impaired Praxis Impairment Details: Motor planning;Initiation Praxis-Other Comments: Limited initiation  Cognition Overall Cognitive Status: Impaired/Different from baseline Arousal/Alertness: Awake/alert Orientation Level: Oriented X4 Attention: Focused;Sustained Focused Attention: Impaired Focused Attention Impairment: Functional basic Sustained Attention: Impaired Sustained Attention Impairment: Functional basic Memory: Impaired Memory Impairment: Retrieval deficit;Decreased short term memory;Decreased recall of new information Decreased Short Term Memory: Functional basic Awareness: Impaired Awareness Impairment: Intellectual impairment Problem Solving: Impaired Problem Solving Impairment: Functional basic Executive Function: Initiating Reasoning: Impaired Reasoning Impairment: Functional basic Initiating: Impaired Initiating Impairment: Functional basic Behaviors: Impulsive;Restless;Poor frustration tolerance Safety/Judgment: Impaired Sensation Sensation Light Touch: Appears Intact Hot/Cold: Appears Intact Proprioception: Impaired by gross assessment Stereognosis: Appears Intact Coordination Fine Motor Movements are Fluid and Coordinated: No Coordination and Movement Description: L hemi, global weakness and deconditioning Finger Nose Finger Test: Limited by LUE weakness. Intact on R Heel Shin Test: UTA on L due  to hip flexor weakness Motor  Motor Motor: Hemiplegia Motor - Skilled Clinical Observations: L hemi   Trunk/Postural Assessment  Cervical Assessment Cervical Assessment: Exceptions to [facility] (forward head) Thoracic Assessment Thoracic Assessment: Exceptions to [facility] (rounded shoulders) Lumbar Assessment Lumbar Assessment: Exceptions to [facility] (posterior pelvic tilt in sitting) Postural Control Postural Control: Deficits on evaluation Righting Reactions: delayed and insufficient  Balance Balance Balance Assessed: Yes Static Sitting Balance Static Sitting - Balance Support: No upper extremity supported Static Sitting - Level of Assistance: 4: Min assist Dynamic Sitting Balance Dynamic Sitting - Balance Support: No upper extremity supported Dynamic Sitting - Level of Assistance: 3: Mod assist Static Standing Balance Static Standing - Balance Support: Bilateral upper extremity supported Static  Standing - Level of Assistance: 3: Mod assist Dynamic Standing Balance Dynamic Standing - Balance Support: Bilateral upper extremity supported;During functional activity Dynamic Standing - Level of Assistance: 2: Max assist Extremity Assessment      RLE Assessment RLE Assessment: Exceptions to [facility] General Strength Comments: Grossly 4-/5 LLE Assessment LLE Assessment: Exceptions to [facility] General Strength Comments: hip flex 2-/5, knee ext 3-/5, ankle DF 3-/5  Care Tool Care Tool Bed Mobility Roll left and right activity   Roll left and right assist level: Maximal Assistance - Patient 25 - 49%    Sit to lying activity   Sit to lying assist level: Maximal Assistance - Patient 25 - 49%    Lying to sitting on side of bed activity   Lying to sitting on side of bed assist level: the ability to move from lying on the back to sitting on the side of the bed with no back support.: Maximal Assistance - Patient 25 - 49%     Care Tool Transfers Sit to stand transfer   Sit to stand assist level:  Maximal Assistance - Patient 25 - 49%    Chair/bed transfer   Chair/bed transfer assist level: Maximal Assistance - Patient 25 - 49%     Toilet transfer   Assist Level: Total Assistance - Patient < 25%    Car transfer   Car transfer assist level: Total Assistance - Patient < 25%      Care Tool Locomotion Ambulation   Assist level: Maximal Assistance - Patient 25 - 49% Assistive device: Parallel bars Max distance: 25f  Walk 10 feet activity Walk 10 feet activity did not occur: Safety/medical concerns       Walk 50 feet with 2 turns activity Walk 50 feet with 2 turns activity did not occur: Safety/medical concerns      Walk 150 feet activity Walk 150 feet activity did not occur: Safety/medical concerns      Walk 10 feet on uneven surfaces activity Walk 10 feet on uneven surfaces activity did not occur: Safety/medical concerns      Stairs Stair activity did not occur: Safety/medical concerns        Walk up/down 1 step activity Walk up/down 1 step or curb (drop down) activity did not occur: Safety/medical concerns      Walk up/down 4 steps activity Walk up/down 4 steps activity did not occur: Safety/medical concerns      Walk up/down 12 steps activity Walk up/down 12 steps activity did not occur: Safety/medical concerns      Pick up small objects from floor Pick up small object from the floor (from standing position) activity did not occur: Safety/medical concerns      Wheelchair Is the patient using a wheelchair?: Yes Type of Wheelchair: Manual   Wheelchair assist level: Dependent - Patient 0% Max wheelchair distance: 012f Wheel 50 feet with 2 turns activity   Assist Level: Dependent - Patient 0%  Wheel 150 feet activity   Assist Level: Dependent - Patient 0%    Refer to Care Plan for Long Term Goals  SHORT TERM GOAL WEEK 1 PT Short Term Goal 1 (Week 1): Pt will complete bed mobility with modA PT Short Term Goal 2 (Week 1): Pt will complete bed<>chair  transfers with modA and LRAD PT Short Term Goal 3 (Week 1): Pt will ambulate 1038fith modA and LRAD PT Short Term Goal 4 (Week 1): Pt will participate in functional outcome measure to assess falls risk  Recommendations  for other services: None   Skilled Therapeutic Intervention Mobility Bed Mobility Bed Mobility: Rolling Right;Rolling Left Rolling Right: Moderate Assistance - Patient 50-74% Rolling Left: Moderate Assistance - Patient 50-74% Transfers Transfers: Sit to Stand;Stand to Sit;Squat Pivot Transfers Sit to Stand: Maximal Assistance - Patient 25-49% Stand to Sit: Maximal Assistance - Patient 25-49% Squat Pivot Transfers: Moderate Assistance - Patient 50-74% Transfer (Assistive device): None Locomotion  Gait Ambulation: Yes Gait Assistance: Maximal Assistance - Patient 25-49% Gait Distance (Feet): 3 Feet Assistive device: Parallel bars Gait Assistance Details: Verbal cues for gait pattern;Verbal cues for sequencing;Visual cues/gestures for precautions/safety;Verbal cues for technique;Verbal cues for safe use of DME/AE;Manual facilitation for weight shifting;Verbal cues for precautions/safety;Tactile cues for weight shifting;Tactile cues for initiation;Tactile cues for posture Gait Gait: Yes Gait Pattern: Impaired Gait Pattern: Step-to pattern;Decreased step length - right;Decreased step length - left;Left genu recurvatum;Narrow base of support;Poor foot clearance - left;Poor foot clearance - right;Trunk flexed;Decreased dorsiflexion - left Stairs / Additional Locomotion Stairs: No Wheelchair Mobility Wheelchair Mobility: Yes Wheelchair Assistance: Dependent - Patient 0% Wheelchair Parts Management: Needs assistance Distance: 35f   Skilled Intervention: Pt presenting supine in bed and agreeable to therapy evaluation. Pt with thigh-high compression socks on. She's oriented x4 but has cognitive deficits as outlined above - primarily delayed processing, initiation, STM  impairments, decreased awareness, and decreased insight. Overall, she requires maxA for bed mobility with hospital bed features, mod/maxA for squat<>pivot transfers, maxA for sit<>stand in // bars, and totalA for car transfers. She was able to ambulate a few steps in // bars with maxA. Pt required encouragement throughout session for participation and she reports high levels of fatigue. She required totalA for LB dressing at bed level and was unable to bridge to adequately clear hips to pull up her pants. Session concluded with her semi-reclined in bed with bed alarm on and all needs met.  BP taken in supine vs sitting due to hypotensive event earlier with OT: Supine: 156/64 Sitting: 139/75 *Pt asymptomatic.  Instructed pt in results of PT evaluation as detailed above, PT POC, rehab potential, rehab goals, and discharge recommendations. Additionally discussed CIR's policies regarding fall safety and use of chair alarm and/or quick release belt. Pt verbalized understanding and in agreement. Will update pt's family members as they become available.   Discharge Criteria: Patient will be discharged from PT if patient refuses treatment 3 consecutive times without medical reason, if treatment goals not met, if there is a change in medical status, if patient makes no progress towards goals or if patient is discharged from hospital.  The above assessment, treatment plan, treatment alternatives and goals were discussed and mutually agreed upon: by patient  CAlger SimonsPT, DPT 11/06/2021, 2:27 PM

## 2021-11-06 NOTE — Evaluation (Signed)
Occupational Therapy Assessment and Plan  Patient Details  Name: Cassandra Dodson MRN: [id number] Date of Birth: [birth year]  OT Diagnosis: abnormal posture, acute pain, altered mental status, cognitive deficits, hemiplegia affecting non-dominant side, muscle weakness (generalized), and swelling of limb Rehab Potential: Rehab Potential (ACUTE ONLY): Good ELOS: 16-18days   Today's Date: 11/06/2021 OT Individual Time: 1740-8144 OT Individual Time Calculation (min): 75 min     Hospital Problem: Principal Problem:   Right pontine cerebrovascular accident [facility])   Past Medical History:  Past Medical History:  Diagnosis Date   Arthritis    CKD (chronic kidney disease), stage III ([place])    patient denies   Depression    Diabetes mellitus without complication ([place])    Difficult intravenous access    Endometrial cancer ([place])    GERD (gastroesophageal reflux disease)    Headache    History of radiation therapy 11/04/2019-12/01/2019   Endometrial HDR; Dr. Gery Pray   Hypertension    Hypothyroidism    Neuromuscular disorder ([place])    neuropathy in feet   Osteoarthritis    PMB (postmenopausal bleeding)    PONV (postoperative nausea and vomiting)    severe nausea and vomiting after knee replacement 06-2014, did ok with 2018 knee replacement   Urinary frequency    Wears glasses    Past Surgical History:  Past Surgical History:  Procedure Laterality Date   BREAST SURGERY     cyst removed   CESAREAN SECTION     DILATION AND CURETTAGE OF UTERUS N/A 06/24/2019   Procedure: DILATATION AND CURETTAGE;  Surgeon: Lafonda Mosses, MD;  Location: [place];  Service: Gynecology;  Laterality: N/A;   FRACTURE SURGERY     right knee   INTRAUTERINE DEVICE (IUD) INSERTION N/A 06/24/2019   Procedure: INTRAUTERINE DEVICE (IUD) INSERTION MIRENA;  Surgeon: Lafonda Mosses, MD;  Location: [facility];  Service: Gynecology;  Laterality: N/A;   IR ANGIO INTRA EXTRACRAN  SEL COM CAROTID INNOMINATE UNI R MOD SED  11/05/2021   IR ANGIO VERTEBRAL SEL VERTEBRAL UNI R MOD SED  11/05/2021   IR CT HEAD LTD  11/01/2021   IR INTRA CRAN STENT  11/01/2021   IR US GUIDE VASC ACCESS RIGHT  11/01/2021   IRRIGATION AND DEBRIDEMENT SEBACEOUS CYST     JOINT REPLACEMENT Right    right   LYMPH NODE DISSECTION N/A 09/14/2019   Procedure: LYMPH NODE DISSECTION;  Surgeon: Lafonda Mosses, MD;  Location: [facility];  Service: Gynecology;  Laterality: N/A;   RADIOLOGY WITH ANESTHESIA N/A 11/01/2021   Procedure: ANGIOPLASTY/STENT;  Surgeon: Luanne Bras, MD;  Location: [place];  Service: Radiology;  Laterality: N/A;   ROBOTIC ASSISTED TOTAL HYSTERECTOMY WITH BILATERAL SALPINGO OOPHERECTOMY Bilateral 09/14/2019   Procedure: XI ROBOTIC ASSISTED TOTAL HYSTERECTOMY WITH BILATERAL SALPINGO OOPHORECTOMY;  Surgeon: Lafonda Mosses, MD;  Location: [facility];  Service: Gynecology;  Laterality: Bilateral;   SENTINEL NODE BIOPSY N/A 09/14/2019   Procedure: SENTINEL NODE BIOPSY;  Surgeon: Lafonda Mosses, MD;  Location: [facility];  Service: Gynecology;  Laterality: N/A;   teeth extration     TONSILLECTOMY     TOTAL HIP ARTHROPLASTY Right 02/23/2015   Procedure: RIGHT TOTAL HIP ARTHROPLASTY ANTERIOR APPROACH;  Surgeon: Leandrew Koyanagi, MD;  Location: [place];  Service: Orthopedics;  Laterality: Right;   TOTAL KNEE ARTHROPLASTY Left 06/27/2016   Procedure: LEFT TOTAL KNEE ARTHROPLASTY;  Surgeon: Leandrew Koyanagi, MD;  Location: [place];  Service: Orthopedics;  Laterality:  Left;    Assessment & Plan Clinical Impression: Patient is a 81 y.o.  right-handed female history of endometrial cancer, hypertension, type 2 diabetes mellitus, hypothyroidism, CKD stage III, history of left hip and bilateral knee replacement.  Per chart review patient lives alone.  1 level apartment.  She does have a son in the area.  Used a rolling walker prior to admission.  She does have an aide 2-3 days a week.  Presented 10/25/2021 to  [facility] with acute onset of slurred speech and left-sided weakness x2 days.  CT/MRI showed large acute right paramedian pontine infarction.  Anterior cranial fossa meningioma with large amount of surrounding vasogenic edema in the inferior left frontal lobe.  Rightward midline shift with subfalcine herniation of the left cingulate gyrus.  Given high risk of recurrent stroke with devastating outcome patient underwent basilar artery stenting assisted angioplasty 7/13 per interventional radiology.    Neurosurgery follow-up in regards to left frontal large meningioma plan for possible surgical resection 8-12 weeks.    She is currently on a dysphagia #3 nectar thick liquid diet.    Patient transferred to CIR on 11/05/2021 .    Patient currently requires total with basic self-care skills secondary to muscle weakness, abnormal tone, decreased coordination, and decreased motor planning, decreased initiation, decreased attention, decreased awareness, decreased problem solving, decreased safety awareness, decreased memory, and delayed processing, and decreased sitting balance, decreased standing balance, decreased postural control, hemiplegia, and decreased balance strategies.  Prior to hospitalization, patient could complete BADL with independent .  Patient will benefit from skilled intervention to decrease level of assist with basic self-care skills and increase independence with basic self-care skills prior to discharge home with care partner.  Anticipate patient will require minimal physical assistance and follow up outpatient.  OT - End of Session Activity Tolerance: Decreased this session Endurance Deficit: Yes Endurance Deficit Description: Unable to maintain arousal - orthostatic OT Assessment Rehab Potential (ACUTE ONLY): Good OT Barriers to Discharge: Pending surgery OT Barriers to Discharge Comments: Plan is for patient to have neurosurgery to address mengioma OT Patient demonstrates impairments  in the following area(s): Balance;Behavior;Pain;Cognition;Perception;Edema;Safety;Endurance;Sensory;Motor;Skin Integrity OT Basic ADL's Functional Problem(s): Eating;Grooming;Bathing;Dressing;Toileting OT Transfers Functional Problem(s): Toilet;Tub/Shower OT Additional Impairment(s): Fuctional Use of Upper Extremity OT Plan OT Intensity: Minimum of 1-2 x/day, 45 to 90 minutes OT Frequency: 5 out of 7 days OT Duration/Estimated Length of Stay: 16-18days OT Treatment/Interventions: Balance/vestibular training;Disease mangement/prevention;Neuromuscular re-education;Patient/family education;Self Care/advanced ADL retraining;Splinting/orthotics;Therapeutic Exercise;UE/LE Coordination activities;Visual/perceptual remediation/compensation;UE/LE Strength taining/ROM;Therapeutic Activities;Pain management;Functional mobility training;DME/adaptive equipment instruction;Discharge planning;Cognitive remediation/compensation OT Self Feeding Anticipated Outcome(s): mod I OT Basic Self-Care Anticipated Outcome(s): Min assist OT Toileting Anticipated Outcome(s): Min assist OT Bathroom Transfers Anticipated Outcome(s): min assist OT Recommendation Patient destination: Home Follow Up Recommendations: Outpatient OT Equipment Recommended: To be determined Equipment Details: Need to interview family for better understanding of discharge plan/destination/ and current needs   OT Evaluation Precautions/Restrictions  Precautions Precautions: Fall Precaution Comments: incontinent, contact precautions, Left hemi, watch BP Restrictions Weight Bearing Restrictions: No General Chart Reviewed: Yes Additional Pertinent History: HTN, DM2, CKD - stage III, Endometrial CA, menigioma Family/Caregiver Present: No Vital Signs Therapy Vitals Pulse Rate: 65 BP: (!) 98/55 Patient Position (if appropriate): Sitting Oxygen Therapy SpO2: 95 % O2 Device: Room Air Pain Pain Assessment Pain Score: 0-No pain Pain Type:  Acute pain (Denied pain, but wincing with right leg pain when applying teds, saying ouch  indicating feet hurt during transfer) Pain Location: Foot Home Living/Prior Functioning Home Living Living Arrangements: Alone  Available Help at Discharge: Family, Available 24 hours/day Type of Home: Apartment (4th floor. Elevator access) Home Access: Level entry Home Layout: One level Bathroom Shower/Tub: Government social research officer Accessibility: Yes Additional Comments: Per prior interview - patient unable to fully answer questions and no family present  Lives With: Alone IADL History Education: Master's Prior Function Level of Independence: Needs assistance with homemaking, Requires assistive device for independence, Independent with basic ADLs (Has HHA 2-3days/wk that primarily assisted with homemaking (laundry, cleaning, etc))  Able to Take Stairs?: Yes Driving: Yes Vocation: Retired Surveyor, mining Baseline Vision/History: 1 Wears glasses Ability to See in Adequate Light: 0 Adequate Patient Visual Report: No change from baseline Vision Assessment?: Yes Eye Alignment: Within Functional Limits Ocular Range of Motion: Restricted on the right;Impaired-to be further tested in functional context Alignment/Gaze Preference: Within Defined Limits Tracking/Visual Pursuits: Right eye does not track medially;Decreased smoothness of horizontal tracking;Impaired - to be further tested in functional context Saccades: Additional head turns occurred during testing;Impaired - to be further tested in functional context Convergence: Impaired (comment) Visual Fields: Impaired-to be further tested in functional context Additional Comments: Patient with decreased arousal - need further assessment Perception  Perception: Impaired Inattention/Neglect: Does not attend to left side of body Spatial Orientation: Decreased midline awareness Praxis Praxis: Impaired Praxis Impairment Details: Motor  planning;Initiation Praxis-Other Comments: Limited initiation Cognition Cognition Overall Cognitive Status: Impaired/Different from baseline Arousal/Alertness: Awake/alert Orientation Level: Person;Place;Situation Person: Oriented Place: Oriented Situation: Disoriented Memory: Impaired Memory Impairment: Retrieval deficit;Decreased short term memory;Decreased recall of new information Decreased Short Term Memory: Functional basic Attention: Focused;Sustained Focused Attention: Impaired Focused Attention Impairment: Functional basic Sustained Attention: Impaired Sustained Attention Impairment: Functional basic Awareness: Impaired Awareness Impairment: Intellectual impairment Problem Solving: Impaired Problem Solving Impairment: Functional basic Executive Function: Initiating Reasoning: Impaired Reasoning Impairment: Functional basic Initiating: Impaired Initiating Impairment: Functional basic Behaviors: Impulsive;Restless;Poor frustration tolerance Safety/Judgment: Impaired Brief Interview for Mental Status (BIMS) Repetition of Three Words (First Attempt): None Temporal Orientation: Year: No answer Temporal Orientation: Month: Missed by more than 1 month Temporal Orientation: Day: No answer Recall: "Sock": No, could not recall Recall: "Blue": No, could not recall Recall: "Bed": No, could not recall BIMS Summary Score: 0 Sensation Sensation Light Touch: Not tested (Due to time/lethargy - need to assess) Hot/Cold: Not tested Proprioception: Impaired by gross assessment Stereognosis: Not tested Coordination Gross Motor Movements are Fluid and Coordinated: No Fine Motor Movements are Fluid and Coordinated: No Coordination and Movement Description: L hemi, global weakness and deconditioning Finger Nose Finger Test: Limited by LUE weakness. Intact on R Heel Shin Test: UTA on L due to hip flexor weakness Motor  Motor Motor: Hemiplegia;Motor impersistence;Abnormal postural  alignment and control Motor - Skilled Clinical Observations: inability to sustain hip and knee extension/trunk extension in standing more than 2-3 seconds  Trunk/Postural Assessment  Cervical Assessment Cervical Assessment: Exceptions to [facility] (forward head) Thoracic Assessment Thoracic Assessment: Exceptions to [facility] (flexed trunk) Lumbar Assessment Lumbar Assessment: Exceptions to [facility] (posterior pelvis) Postural Control Postural Control: Deficits on evaluation (Unable to remain upright in sitting or standing, falls backward or forward with momentum) Righting Reactions: delayed and insufficient  Balance Balance Balance Assessed: Yes Static Sitting Balance Static Sitting - Balance Support: No upper extremity supported;Feet supported Static Sitting - Level of Assistance: 3: Mod assist Dynamic Sitting Balance Dynamic Sitting - Balance Support: Feet supported;During functional activity Dynamic Sitting - Level of Assistance: 2: Max assist Dynamic Sitting - Balance Activities: Forward lean/weight shifting;Reaching for objects Static Standing Balance  Static Standing - Balance Support: During functional activity Static Standing - Level of Assistance: 2: Max assist Dynamic Standing Balance Dynamic Standing - Balance Support: Bilateral upper extremity supported;During functional activity Dynamic Standing - Level of Assistance: 2: Max assist Extremity/Trunk Assessment RUE Assessment RUE Assessment: Not tested (Patient unable to remain alert - will assess) LUE Assessment LUE Assessment: Not tested General Strength Comments: Patient with delayed movement and motor impersistence in LUE  Care Tool Care Tool Self Care Eating    Max assist    Oral Care     Total assist    Bathing   Body parts bathed by patient: Right arm;Left arm;Chest;Abdomen;Right upper leg;Left upper leg;Face Body parts bathed by helper: Front perineal area;Buttocks;Right lower leg;Left lower leg   Assist Level: 2  Helpers    Upper Body Dressing(including orthotics)   What is the patient wearing?: Pull over shirt   Assist Level: Maximal Assistance - Patient 25 - 49%    Lower Body Dressing (excluding footwear)   What is the patient wearing?: Incontinence brief Assist for lower body dressing: 2 Helpers    Putting on/Taking off footwear   What is the patient wearing?: Ted hose Assist for footwear: Dependent - Patient 0%       Care Tool Toileting Toileting activity   Assist for toileting: 2 Helpers     Care Tool Bed Mobility Roll left and right activity   Roll left and right assist level: Maximal Assistance - Patient 25 - 49%    Sit to lying activity   Sit to lying assist level: Maximal Assistance - Patient 25 - 49%    Lying to sitting on side of bed activity   Lying to sitting on side of bed assist level: the ability to move from lying on the back to sitting on the side of the bed with no back support.: Maximal Assistance - Patient 25 - 49%     Care Tool Transfers Sit to stand transfer   Sit to stand assist level: Maximal Assistance - Patient 25 - 49%    Chair/bed transfer   Chair/bed transfer assist level: Maximal Assistance - Patient 25 - 49%     Toilet transfer   Assist Level: Total Assistance - Patient < 25%     Care Tool Cognition  Expression of Ideas and Wants Expression of Ideas and Wants: 2. Frequent difficulty - frequently exhibits difficulty with expressing needs and ideas  Understanding Verbal and Non-Verbal Content Understanding Verbal and Non-Verbal Content: 2. Sometimes understands - understands only basic conversations or simple, direct phrases. Frequently requires cues to understand   Memory/Recall Ability Memory/Recall Ability : None of the above were recalled   Refer to Care Plan for Long Term Goals  SHORT TERM GOAL WEEK 1 OT Short Term Goal 1 (Week 1): Patient will identify need to void and have at least 1 continent episode OT Short Term Goal 2 (Week 1):  Patient will transfer to [facility] with mod assist OT Short Term Goal 3 (Week 1): Patient will dress upper body with min assist (excluding bra) OT Short Term Goal 4 (Week 1): Patient will dress lower body with max assist OT Short Term Goal 5 (Week 1): Patient will bathe upper body with min assist following set up  Recommendations for other services: None    Skilled Therapeutic Intervention Patient received supine in bed - indicating she had a "slight stroke."  Patient assisted to sitting position and allowed to acclimate to position. Patient unable to verbalize problems,  and not a clear historian, family not present.  Patient transferred to wheelchair, and was incontinent of both urine and stool.  Patient assistance sought out, and Stedy lift brought in in attempt to clean patient - transfer to commode.  Patient became unresponsive while in [place], and vitals indicated low BP.  Nursing staff alerted, Ted hose applied, patient assisted to bed and cleaned.  BP resolving.  Patient left in bed, bed alarm set.  Patient fatigued but responsive.  Call bell in reach.    ADL ADL Grooming: Maximal assistance Where Assessed-Grooming: Sitting at sink Upper Body Bathing: Maximal assistance Where Assessed-Upper Body Bathing: Sitting at sink Lower Body Bathing: Dependent Where Assessed-Lower Body Bathing: Standing at sink;Sitting at sink Upper Body Dressing: Maximal assistance Where Assessed-Upper Body Dressing: Chair Lower Body Dressing: Dependent Where Assessed-Lower Body Dressing: Chair Toileting: Dependent Where Assessed-Toileting: Bed level;Medical laboratory scientific officer: Dependent Armed forces technical officer Method: Other (comment) (Stedy lift - two person assist) Science writer: Radiographer, therapeutic: Unable to assess Tub/Shower Transfer Method: Unable to assess [employer] Transfer: Unable to assess [employer] Method: Unable to assess ADL Comments: Patient unable to remain  alert entire session. Mobility  Bed Mobility Bed Mobility: Rolling Right;Rolling Left Rolling Right: Maximal Assistance - Patient 25-49% Rolling Left: Maximal Assistance - Patient 25-49% Transfers Sit to Stand: Maximal Assistance - Patient 25-49% Stand to Sit: Maximal Assistance - Patient 25-49%   Discharge Criteria: Patient will be discharged from OT if patient refuses treatment 3 consecutive times without medical reason, if treatment goals not met, if there is a change in medical status, if patient makes no progress towards goals or if patient is discharged from hospital.  The above assessment, treatment plan, treatment alternatives and goals were discussed and mutually agreed upon: No family available/patient unable  Mariah Milling 11/06/2021, 3:27 PM

## 2021-11-06 NOTE — Progress Notes (Signed)
[facility] Individual Statement of Services  Patient Name:  Cassandra Dodson  Date:  11/06/2021  Welcome to the [facility].  Our goal is to provide you with an individualized program based on your diagnosis and situation, designed to meet your specific needs.  With this comprehensive rehabilitation program, you will be expected to participate in at least 3 hours of rehabilitation therapies Monday-Friday, with modified therapy programming on the weekends.  Your rehabilitation program will include the following services:  Physical Therapy (PT), Occupational Therapy (OT), Speech Therapy (ST), 24 hour per day rehabilitation nursing, Therapeutic Recreaction (TR), Care Coordinator, Rehabilitation Medicine, Nutrition Services, and Pharmacy Services  Weekly team conferences will be held on Wednesday to discuss your progress.  Your Inpatient Rehabilitation Care Coordinator will talk with you frequently to get your input and to update you on team discussions.  Team conferences with you and your family in attendance may also be held.  Expected length of stay: 16-18 days  Overall anticipated outcome: overall min assist level  Depending on your progress and recovery, your program may change. Your Inpatient Rehabilitation Care Coordinator will coordinate services and will keep you informed of any changes. Your Inpatient Rehabilitation Care Coordinator's name and contact numbers are listed  below.  The following services may also be recommended but are not provided by the [facility]:   [place] will be made to provide these services after discharge if needed.  Arrangements include referral to agencies that provide these services.  Your insurance has been verified to be:  [employer]-Medicare Your primary doctor is:  Shon Baton  Pertinent information will be shared with your  doctor and your insurance company.  Inpatient Rehabilitation Care Coordinator:  Ovidio Kin, Gideon or Emilia Beck  Information discussed with and copy given to patient by: Elease Hashimoto, 11/06/2021, 9:40 AM

## 2021-11-06 NOTE — Plan of Care (Signed)
  Problem: RH Balance Goal: LTG: Patient will maintain dynamic sitting balance (OT) Description: LTG:  Patient will maintain dynamic sitting balance with assistance during activities of daily living (OT) Flowsheets (Taken 11/06/2021 1535) LTG: Pt will maintain dynamic sitting balance during ADLs with: Supervision/Verbal cueing Goal: LTG Patient will maintain dynamic standing with ADLs (OT) Description: LTG:  Patient will maintain dynamic standing balance with assist during activities of daily living (OT)  Flowsheets (Taken 11/06/2021 1535) LTG: Pt will maintain dynamic standing balance during ADLs with: Minimal Assistance - Patient > 75%

## 2021-11-06 NOTE — Progress Notes (Signed)
Patient ID: Cassandra Dodson, female   DOB: 1941/04/16, 81 y.o.   MRN: [id number] Met with the patient to review current situation, rehab process, team conference and plan of care. Discussed medications  and secondary risk including DM, (A1C 7.0) and HTN, HLD (LDL 133/Trig 147), CKD. Reports son checked her blood sugars and kept up with her medications. Aware of meningioma and pending surgery. Denied headaches but did confirm blurred vision at times PTA. Continue to follow along to address educational needs to facilitate preparation for discharge home with son. Hervey Ard, Dalbert Batman

## 2021-11-07 DIAGNOSIS — E1140 Type 2 diabetes mellitus with diabetic neuropathy, unspecified: Secondary | ICD-10-CM | POA: Diagnosis not present

## 2021-11-07 DIAGNOSIS — I6350 Cerebral infarction due to unspecified occlusion or stenosis of unspecified cerebral artery: Secondary | ICD-10-CM | POA: Diagnosis not present

## 2021-11-07 DIAGNOSIS — I10 Essential (primary) hypertension: Secondary | ICD-10-CM | POA: Diagnosis not present

## 2021-11-07 DIAGNOSIS — E785 Hyperlipidemia, unspecified: Secondary | ICD-10-CM | POA: Diagnosis not present

## 2021-11-07 LAB — GLUCOSE, CAPILLARY
Glucose-Capillary: 191 mg/dL — ABNORMAL HIGH (ref 70–99)
Glucose-Capillary: 201 mg/dL — ABNORMAL HIGH (ref 70–99)
Glucose-Capillary: 159 mg/dL — ABNORMAL HIGH (ref 70–99)
Glucose-Capillary: 198 mg/dL — ABNORMAL HIGH (ref 70–99)

## 2021-11-07 MED ORDER — INSULIN GLARGINE-YFGN 100 UNIT/ML ~~LOC~~ SOLN
34.0000 [IU] | Freq: Every day | SUBCUTANEOUS | Status: DC
  Administered 2021-11-07 – 2021-11-08 (×2): 34 [IU] via SUBCUTANEOUS
  Filled 2021-11-07 (×4): qty 0.34

## 2021-11-07 NOTE — Patient Care Conference (Signed)
Inpatient RehabilitationTeam Conference and Plan of Care Update Date: 11/07/2021   Time: 11:17 AM    Patient Name: Cassandra Dodson      Medical Record Number: [id number]  Date of Birth: [birth year] Sex: Female         Room/Bed: 4M13C/4M13C-01 Payor Info: Payor: [employer] MEDICARE / Plan: [facility] MEDICARE / Product Type: *No Product type* /    Admit Date/Time:  11/05/2021  3:00 PM  Primary Diagnosis:  Right pontine cerebrovascular accident [facility])  Hospital Problems: Principal Problem:   Right pontine cerebrovascular accident [facility])    Expected Discharge Date: Expected Discharge Date: 11/27/21  Team Members Present: Physician leading conference: Dr. Leeroy Cha Social Worker Present: Ovidio Kin, LCSW Nurse Present: Dorien Chihuahua, RN PT Present: Ginnie Smart, PT OT Present: Other (comment) Antony Salmon, OT) SLP Present: Weston Anna, SLP PPS Coordinator present : Gunnar Fusi, SLP     Current Status/Progress Goal Weekly Team Focus  Bowel/Bladder   This patient is incontient  of bladder/bowel  11/05/21     Assess toileting needs Q2 hrs and prn,and provided incontient  care   Swallow/Nutrition/ Hydration   dys 3 diet with nectar thick liquids, mod A at eval to reduce rate and bolus size  sup A  diet tolerance, ice chips/thin liquid trials with SLP   ADL's   Max assist bathing and dressing, total assist toileting, significant deconditioning, unstable BP - Orthostatic, decreased arousal, left hemiparesis/plegia  Min assist overall - BADL  Improve activity tolerance, improve left sided strength and attention, increase arousal, decrease level of assist for BADL   Mobility   maxA bed mobility, maxA squat<>pivot transfers, modA sit<>stand in // bars, maxA for taking a few steps in // bars. totalA for car transfer. L inattention, global deconditioning/weakness, somewhat apathetic towards therapy, cog deficits  minA  Activity tolerance, safety awareness, functional transfers,  bed mobility, pre-gait training.   Communication   min A at eval for speech intelligibility  sup A  speech strategies at the sentence level   Safety/Cognition/ Behavioral Observations  max A at eval  min A  orientation and memory with use of external aids, basic problem solving, intellectual awareness   Pain   Denies pain or discomfort  <2 painfree  Assess pain QS/PRN,   Skin   Entire right arm with bruising, with ecchymosis, , multiple bruising to bilateral arms and LE'  No additional bruising or skin  QS/PRN daily ass     Discharge Planning:  HOme with son staying with her made aware she will require 24/7 care at DC from [place]. Goal is to get her stronger to prepare her for brain surgery   Team Discussion: Patient with vaga/vagal episode; requires TEDs for therapy only. MD adjusting meds. Incontinent with ESBL in urine. Having postural control issues, aphasia and is very apathetic towards therapy.  Patient on target to meet rehab goals: yes, currently needs max assist for rolling and  upper body dressing and total assist for lower body dressing.Completes sit-stand with max assist and squat pivot with mod - max assist. Goals for discharge set for min assist overall.  *See Care Plan and progress notes for long and short-term goals.   Revisions to Treatment Plan:  N/a  Teaching Needs: Safety, dietary modifications, medications, transfers, toileting, etc.  Current Barriers to Discharge: Decreased caregiver support, Home enviroment access/layout, and Incontinence  Possible Resolutions to Barriers: Family education     Medical Summary Current Status: elevated CBGs secondary to type 2 diabetes,  overweight, poor postural control, impaired cognition, vasovagal episode yesterday, HTN  Barriers to Discharge: Medical stability  Barriers to Discharge Comments: elevated CBGs secondary to type 2 diabetes, overweight, poor postural control, impaired cognition, vasovagal episode yesterday,  HTN Possible Resolutions to [employer] Focus: increase Semglee to 34U, provide dietary education, ordered TEDs for therapy only, increased Avapro, continue amlodipine '10mg'$  daily   Continued Need for Acute Rehabilitation Level of Care: The patient requires daily medical management by a physician with specialized training in physical medicine and rehabilitation for the following reasons: Direction of a multidisciplinary physical rehabilitation program to maximize functional independence : Yes Medical management of patient stability for increased activity during participation in an intensive rehabilitation regime.: Yes Analysis of laboratory values and/or radiology reports with any subsequent need for medication adjustment and/or medical intervention. : Yes   I attest that I was present, lead the team conference, and concur with the assessment and plan of the team.   Dorien Chihuahua B 11/07/2021, 3:53 PM

## 2021-11-07 NOTE — IPOC Note (Signed)
Overall Plan of Care [facility]) Patient Details Name: Cassandra Dodson MRN: [id number] DOB: [birth year]  Admitting Diagnosis: Right pontine cerebrovascular accident [facility])  Hospital Problems: Principal Problem:   Right pontine cerebrovascular accident [facility])     Functional Problem List: Nursing Bladder, Bowel, Pain, Safety, Endurance, Medication Management  PT Balance, Endurance, Motor, Safety, Skin Integrity  OT Balance, Behavior, Pain, Cognition, Perception, Edema, Safety, Endurance, Sensory, Motor, Skin Integrity  SLP Cognition, Nutrition, Safety  TR         Basic ADL's: OT Eating, Grooming, Bathing, Dressing, Toileting     Advanced  ADL's: OT       Transfers: PT Bed Mobility, Bed to Chair, Teacher, early years/pre, Tub/Shower     Locomotion: PT Ambulation, Stairs     Additional Impairments: OT Fuctional Use of Upper Extremity  SLP Swallowing, Communication, Social Cognition expression Memory, Attention, Awareness  TR      Anticipated Outcomes Item Anticipated Outcome  Self Feeding mod I  Swallowing  sup A   Basic self-care  Min assist  Toileting  Min assist   Bathroom Transfers min assist  Bowel/Bladder  manage bowel w mod I and bladder w toileting  Transfers  minA with LRAD  Locomotion  minA with LRAD.  Communication  sup A  Cognition  min A  Pain  pain at or below level 4 with prns  Safety/Judgment  maintain safety w cues   Therapy Plan: PT Intensity: Minimum of 1-2 x/day ,45 to 90 minutes PT Frequency: 5 out of 7 days PT Duration Estimated Length of Stay: 2-2.5 weeks OT Intensity: Minimum of 1-2 x/day, 45 to 90 minutes OT Frequency: 5 out of 7 days OT Duration/Estimated Length of Stay: 16-18days SLP Intensity: Minumum of 1-2 x/day, 30 to 90 minutes SLP Frequency: 3 to 5 out of 7 days SLP Duration/Estimated Length of Stay: 2-2.5 weeks   Team Interventions: Nursing Interventions Bladder Management, Patient/Family Education, Pain Management, Medication  Management, Bowel Management, Discharge Planning, Disease Management/Prevention, Skin Care/Wound Management, Dysphagia/Aspiration Precaution Training  PT interventions Ambulation/gait training, Functional mobility training, Discharge planning, Psychosocial support, Therapeutic Activities, Visual/perceptual remediation/compensation, Wheelchair propulsion/positioning, Therapeutic Exercise, Skin care/wound management, Neuromuscular re-education, Disease management/prevention, Training and development officer, Cognitive remediation/compensation, DME/adaptive equipment instruction, Pain management, Splinting/orthotics, UE/LE Strength taining/ROM, Stair training, UE/LE Coordination activities, Patient/family education, Functional electrical stimulation, Community reintegration  OT Interventions Training and development officer, Disease mangement/prevention, Neuromuscular re-education, Patient/family education, Self Care/advanced ADL retraining, Splinting/orthotics, Therapeutic Exercise, UE/LE Coordination activities, Visual/perceptual remediation/compensation, UE/LE Strength taining/ROM, Therapeutic Activities, Pain management, Functional mobility training, DME/adaptive equipment instruction, Discharge planning, Cognitive remediation/compensation  SLP Interventions Cognitive remediation/compensation, Dysphagia/aspiration precaution training, Functional tasks, Patient/family education, Speech/Language facilitation, Therapeutic Activities  TR Interventions    SW/CM Interventions Discharge Planning, Psychosocial Support, Patient/Family Education   Barriers to Discharge MD  Medical stability  Nursing Decreased caregiver support, Incontinence 1 level apt; used RW PTA; son local  PT Decreased caregiver support, Lack of/limited family support, Insurance for SNF coverage, Weight, Incontinence    OT Pending surgery Plan is for patient to have neurosurgery to address [place] for SNF coverage      Team Discharge Planning: Destination: PT-Home ,OT- Home , SLP-Home Projected Follow-up: PT-Home health PT, 24 hour supervision/assistance, OT-  Outpatient OT, SLP-24 hour supervision/assistance, Home Health SLP Projected Equipment Needs: PT-To be determined, OT- To be determined, SLP-To be determined Equipment Details: PT-Pt owns RW, OT-Need to interview family for better understanding of discharge plan/destination/ and current needs Patient/family  involved in discharge planning: PT- Patient,  OT-Patient unable/family or caregiver not available, SLP-Patient, Family member/caregiver  MD ELOS: 14-16 days Medical Rehab Prognosis:  Excellent Assessment: The patient has been admitted for CIR therapies with the diagnosis of right paramedian pontine infarction. The team will be addressing functional mobility, strength, stamina, balance, safety, adaptive techniques and equipment, self-care, bowel and bladder mgt, patient and caregiver education. Goals have been set at [facility]. Anticipated discharge destination is home.        See Team Conference Notes for weekly updates to the plan of care

## 2021-11-07 NOTE — Progress Notes (Signed)
Patient ID: Cassandra Dodson, female   DOB: [birth year], 81 y.o.   MRN: [id number]  Met with pt and left message for son to update both regarding team conference goals of min assist level and target discharge date of 8/8. Pt feels better today and does look better. She hopes to do better than expected goals. Will await return call from son regarding questions or concerns.

## 2021-11-07 NOTE — Progress Notes (Signed)
Occupational Therapy Session Note  Patient Details  Name: Cassandra Dodson MRN: [id number] Date of Birth: January 17, 1941  Today's Date: 11/07/2021 OT Individual Time: 1131-1200 OT Individual Time Calculation (min): 29 min    Short Term Goals: Week 1:  OT Short Term Goal 1 (Week 1): Patient will identify need to void and have at least 1 continent episode OT Short Term Goal 2 (Week 1): Patient will transfer to [facility] with mod assist OT Short Term Goal 3 (Week 1): Patient will dress upper body with min assist (excluding bra) OT Short Term Goal 4 (Week 1): Patient will dress lower body with max assist OT Short Term Goal 5 (Week 1): Patient will bathe upper body with min assist following set up  Skilled Therapeutic Interventions/Progress Updates:    Patient received sitting up in wheelchair.  Patient more alert today.  Skilled OT intervention to address visual skills and cognition.  Patient with dysconjugate vision.  Left eye oriented severely laterally, and right eye fixed midline.  Eyes not able to track in coordinated fashion but unilaterally could track in all directions.  Patient with horizontal nystagmus left eye - with end range lateral tracking.  Patient wears readers - and able to read large bold print of newspaper with increased time and mod cueing.   Reviewed daily calendar to orient patient to day, date, year.  Also reviewed therapy schedule, patient required max cueing to follow.   Patient left up in wheelchair with safety belt in place and engaged.  Reviewed functions of call bell - patient able to ID button for nurse and TV.  Call bell in lap.   Therapy Documentation Precautions:  Precautions Precautions: Fall Precaution Comments: incontinent, contact precautions, Left hemi, watch BP Restrictions Weight Bearing Restrictions: No  Pain:  No report of pain      Therapy/Group: Individual Therapy  Mariah Milling 11/07/2021, 12:12 PM

## 2021-11-07 NOTE — Progress Notes (Addendum)
PROGRESS NOTE   Subjective/Complaints: Sleepy this morning CBGs elevated, increased Semglee Had vasovagal episode yesterday- apply teds with therapy only  ROS: +hard of hearing. +vasocagal episode yesterday   Objective:   No results found. Recent Labs    11/05/21 0235 11/06/21 0545  WBC 9.1 8.2  HGB 12.7 11.9*  HCT 38.1 34.9*  PLT 167 163   Recent Labs    11/05/21 0235 11/06/21 0545  NA 133* 133*  K 5.0 4.5  CL 104 104  CO2 21* 22  GLUCOSE 206* 140*  BUN 52* 53*  CREATININE 1.45* 1.18*  CALCIUM 8.7* 8.7*    Intake/Output Summary (Last 24 hours) at 11/07/2021 1046 Last data filed at 11/07/2021 0700 Gross per 24 hour  Intake 828 ml  Output --  Net 828 ml     Pressure Injury 11/01/21 Sacrum Mid Stage 1 -  Intact skin with non-blanchable redness of a localized area usually over a bony prominence. (Active)  11/01/21 1750  Location: Sacrum  Location Orientation: Mid  Staging: Stage 1 -  Intact skin with non-blanchable redness of a localized area usually over a bony prominence.  Wound Description (Comments):   Present on Admission: Yes    Physical Exam: Vital Signs Blood pressure (!) 146/72, pulse 65, temperature 98.9 F (37.2 C), resp. rate 16, height '5\' 5"'$  (1.651 m), weight 81 kg, SpO2 100 %. Gen: no distress, normal appearing, somnolent HEENT: oral mucosa pink and moist, NCAT, hard of hearing Cardio: Reg rate Chest: normal effort, normal rate of breathing Abd: soft, non-distended Ext: no edema Psych: pleasant, normal affect Skin: intact Neurological:     Comments: Patient is alert.  Mild dysarthria.  Oriented to self and place.  Follows simple commands.  Limited overall medical historian. Cranial nerves II through XII intact, motor strength is 5/5 in RIght and 4- Left deltoid, bicep, tricep, grip,5/5 Right and 2- left  hip flexor, knee extensors, ankle dorsiflexor and plantar flexor Sensory exam  normal sensation to light touch and proprioception in bilateral upper and lower extremities   Assessment/Plan: 1. Functional deficits which require 3+ hours per day of interdisciplinary therapy in a comprehensive inpatient rehab setting. Physiatrist is providing close team supervision and 24 hour management of active medical problems listed below. Physiatrist and rehab team continue to assess barriers to discharge/monitor patient progress toward functional and medical goals  Care Tool:  Bathing    Body parts bathed by patient: Right arm, Left arm, Chest, Abdomen, Right upper leg, Left upper leg, Face   Body parts bathed by helper: Front perineal area, Buttocks, Right lower leg, Left lower leg     Bathing assist Assist Level: 2 Helpers     Upper Body Dressing/Undressing Upper body dressing   What is the patient wearing?: Pull over shirt    Upper body assist Assist Level: Maximal Assistance - Patient 25 - 49%    Lower Body Dressing/Undressing Lower body dressing      What is the patient wearing?: Incontinence brief     Lower body assist Assist for lower body dressing: 2 Helpers     Toileting Toileting    Toileting assist Assist for toileting: 2 Helpers  Transfers Chair/bed transfer  Transfers assist  Chair/bed transfer activity did not occur: Safety/medical concerns  Chair/bed transfer assist level: Maximal Assistance - Patient 25 - 49%     Locomotion Ambulation   Ambulation assist      Assist level: Maximal Assistance - Patient 25 - 49% Assistive device: Parallel bars Max distance: 44f   Walk 10 feet activity   Assist  Walk 10 feet activity did not occur: Safety/medical concerns        Walk 50 feet activity   Assist Walk 50 feet with 2 turns activity did not occur: Safety/medical concerns         Walk 150 feet activity   Assist Walk 150 feet activity did not occur: Safety/medical concerns         Walk 10 feet on uneven  surface  activity   Assist Walk 10 feet on uneven surfaces activity did not occur: Safety/medical concerns         Wheelchair     Assist Is the patient using a wheelchair?: Yes Type of Wheelchair: Manual    Wheelchair assist level: Dependent - Patient 0% Max wheelchair distance: 045f   Wheelchair 50 feet with 2 turns activity    Assist        Assist Level: Dependent - Patient 0%   Wheelchair 150 feet activity     Assist      Assist Level: Dependent - Patient 0%   Blood pressure (!) 146/72, pulse 65, temperature 98.9 F (37.2 C), resp. rate 16, height '5\' 5"'$  (1.651 m), weight 81 kg, SpO2 100 %.    Medical Problem List and Plan: 1. 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  Antithrombotics: -DVT/anticoagulation: Lovenox             -antiplatelet therapy: Aspirin 81 mg daily and Plavix 75 mg day x3 to 6 months 3. Pain Management: Tylenol as needed 4. Mood/Behavior/Sleep: Xanax 0.25-0.5 mg nightly as needed             -antipsychotic agents: N/A 5. Neuropsych/cognition: This patient is capable of making decisions on her own behalf. 6. Skin/Wound Care: Routine skin checks 7. Fluids/Electrolytes/Nutrition: Routine in and outs with follow-up chemistries 8.  Left frontal large meningioma.  Follow-up neurosurgery Dr. ElEllene Route Plan for possible surgical resection 8-12 weeks.  Continue Decadron therapy. 9.  Hypertension.  improved, continue Norvasc 10 mg daily, increase Avapro to 150 mg daily.  Monitor with increased mobility 10.  Diabetes mellitus.  Hemoglobin A1c 7.0. Increase Semglee to 34 units daily.  Monitor while on Decadron therapy 11.  Hyperlipidemia.  Fenofibrate/Crestor 12.  CKD stage III.  Latest creatinine 1.18  on 11/06/21.  Baseline 1.22-1.69.  Cr at baseline, monitor weekly. 13.  History of endometrial cancer.  Follow-up outpatient 14.  GERD.  continue Protonix 15. Vasovagal episode 7/18: placed teds hose on only with therapy, discussed with therapy 16. Overweight BMI 29.72: provide list of foods to help with weight loss 17. Impaired cognition: she has a history of being a university professor, will need 24/7 supervision upon d/c, discussed with son who is a retired maSocial research officer, government plan for brain surgery for tumor  resection.  18. Dysphagia: will need MBS 19. Neurogenic bowel and bladder: bladder program 20. Hard of hearing: needs hearing aides     LOS: 2 days A FACE TO FACE EVALUATION WAS PERFORMED  Clide Deutscher Gisele Pack 11/07/2021, 10:46 AM

## 2021-11-07 NOTE — Progress Notes (Signed)
Occupational Therapy Session Note  Patient Details  Name: Cassandra Dodson MRN: [id number] Date of Birth: [birth year]  Today's Date: 11/07/2021 OT Individual Time: 1400-1500 OT Individual Time Calculation (min): 60 min    Short Term Goals: Week 1:  OT Short Term Goal 1 (Week 1): Patient will identify need to void and have at least 1 continent episode OT Short Term Goal 2 (Week 1): Patient will transfer to [facility] with mod assist OT Short Term Goal 3 (Week 1): Patient will dress upper body with min assist (excluding bra) OT Short Term Goal 4 (Week 1): Patient will dress lower body with max assist OT Short Term Goal 5 (Week 1): Patient will bathe upper body with min assist following set up  Skilled Therapeutic Interventions/Progress Updates:    Patient received supine in bed.  Agreeable to therapy session.  Patient assisted to sit at edge of bed.  Worked on reinforcing concept of sit up tall and in midline.  Patient recalls from earlier PT session.  Patient asking "Am I making progress?"  Patient's BP therapeutic - no TED hose applied.  Patient transferred from bed to wheelchair with max assist - squat pivot.  Patient with significant flexion.  From wheelchair used Stedy to work on sit to stand, and stand to partial sit, repeating 5 times.  Patient with low endurance. Patient with soiled brief, so assisted back to bed via Stedy, and worked on rolling left/right and partial bridging for pericare, and application of new brief and replacing soiled linen.   Patient left in bed sleeping, with bed alarm set and call bell resting at her side.    Therapy Documentation Precautions:  Precautions Precautions: Fall Precaution Comments: incontinent, contact precautions, Left hemi, watch BP Restrictions Weight Bearing Restrictions: No  Pain:  Denies pain    Therapy/Group: Individual Therapy  Mariah Milling 11/07/2021, 3:05 PM

## 2021-11-07 NOTE — Progress Notes (Signed)
Physical Therapy Session Note  Patient Details  Name: Cassandra Dodson MRN: [id number] Date of Birth: May 20, 1940  Today's Date: 11/07/2021 PT Individual Time: 3893-7342 + 8768-1157 PT Individual Time Calculation (min): 57 min  + 40 min  Short Term Goals: Week 1:  PT Short Term Goal 1 (Week 1): Pt will complete bed mobility with modA PT Short Term Goal 2 (Week 1): Pt will complete bed<>chair transfers with modA and LRAD PT Short Term Goal 3 (Week 1): Pt will ambulate 64f with modA and LRAD PT Short Term Goal 4 (Week 1): Pt will participate in functional outcome measure to assess falls risk  Skilled Therapeutic Interventions/Progress Updates:      1st session: Pt supine in bed to start. Denies pain and agreeable to therapy. Discussed patient centered goals to assist with establishing PT POC. Pt reports her goals are "to be well" and "to be with it." Discussed recommendation for 24/7 S/A with PT goals set to min/modA - she reports her son can assist with this but then also reports he works until the afternoon daily.   Pt noted to be incontinent of bowel and bladder with soiled sheets and brief. Brief change at bed level. Multiple bouts of rolling L<>R. She roll's better towards her R but requires maxA. She requires totalA for rolling to her L. Donned compression socks and disposable pants at bed level with dependant assist.  Supine<>sitting EOB with maxA with HOB slightly elevated and use of bed rails. Requires minA for initial sitting due to R lean, progressing to CGA with cues. Squat<>pivot transfer with modA from EOB to w/c, towards her L side. Removed soiled hospital gown and provided her with clean disposable shirt - she required maxA for donning this despite her effort.   Pt then transported to ortho rehab gym for time.   Squat<>pivot transfer with modA to Nustep and required setupA for BLE management onto Nustep. Completed at L1 for 4 minutes with PT providing hand-over-hand for LUE grip  and for keeping L knee in neutral alignment. Returned to w/c in similar manner as above.  Concluded session seated in w/c with safety belt alarm on, call bell in lap, all needs met. NT in room changing bed linen.    2nd session: Pt sitting in w/c to start - agreeable to therapy. Denies pain.   Transported in w/c to main rehab gym for energy conservation and time management.  Squat<>pivot transfer with modA from w/c to mat table, towards her stronger R side. Provided her with 6inch platform under LE to assist with positioning and sitting balance.  Focused remainder of session on static and dynamic sitting balance. Used mirror for visual feedback which she responded well to. Initially required CGA for sitting balance due to L trunk lean with delayed righting and decreased awareness. Able to progress to close supervision with cues and time to acclimate to upright/balance. Worked on slumped to tall sitting on mat table which she was able to do. Facilitated this with vertical ball raises (hand-over-hand assist for paretic LUE). Dynamic sitting with reaching outside BOS to grab resistive clothes pins and place them on mirror in front of her - CGA for safety during this task. Seated ball toss using beach ball to challenge reaction time, righting reaction, hand/eye coordination, and trunk control.  Returned to w/c via squat<>pivot transfer with improved initiation compared to prior. Returned to room and pt requesting to lie down in bed due to fatigue. Suspected bowel incontinence due to smell. NT  notified at end of session. Returned to bed and remained supine with all needs met, bed alarm on.   Therapy Documentation Precautions:  Precautions Precautions: Fall Precaution Comments: incontinent, contact precautions, Left hemi, watch BP Restrictions Weight Bearing Restrictions: No General:    Therapy/Group: Individual Therapy   P  11/07/2021, 7:30 AM  

## 2021-11-08 DIAGNOSIS — E1140 Type 2 diabetes mellitus with diabetic neuropathy, unspecified: Secondary | ICD-10-CM | POA: Diagnosis not present

## 2021-11-08 DIAGNOSIS — E785 Hyperlipidemia, unspecified: Secondary | ICD-10-CM | POA: Diagnosis not present

## 2021-11-08 DIAGNOSIS — I6350 Cerebral infarction due to unspecified occlusion or stenosis of unspecified cerebral artery: Secondary | ICD-10-CM | POA: Diagnosis not present

## 2021-11-08 DIAGNOSIS — I10 Essential (primary) hypertension: Secondary | ICD-10-CM | POA: Diagnosis not present

## 2021-11-08 LAB — GLUCOSE, CAPILLARY
Glucose-Capillary: 61 mg/dL — ABNORMAL LOW (ref 70–99)
Glucose-Capillary: 77 mg/dL (ref 70–99)
Glucose-Capillary: 152 mg/dL — ABNORMAL HIGH (ref 70–99)
Glucose-Capillary: 288 mg/dL — ABNORMAL HIGH (ref 70–99)
Glucose-Capillary: 310 mg/dL — ABNORMAL HIGH (ref 70–99)
Glucose-Capillary: 274 mg/dL — ABNORMAL HIGH (ref 70–99)

## 2021-11-08 LAB — SARS CORONAVIRUS 2 BY RT PCR: SARS Coronavirus 2 by RT PCR: NEGATIVE

## 2021-11-08 NOTE — Plan of Care (Signed)
  Problem: RH Cognition - SLP Goal: RH LTG Patient will demonstrate orientation with cues Description:  LTG:  Patient will demonstrate orientation to person/place/time/situation with cues (SLP)   Flowsheets (Taken 11/08/2021 1731) LTG: Patient will demonstrate orientation using cueing (SLP): Moderate Assistance - Patient 50 - 74% Note: Goals downgraded after obtaining more formal cognitive measures   Problem: RH Problem Solving Goal: LTG Patient will demonstrate problem solving for (SLP) Description: LTG:  Patient will demonstrate problem solving for basic/complex daily situations with cues  (SLP) Flowsheets (Taken 11/08/2021 1731) LTG Patient will demonstrate problem solving for: Moderate Assistance - Patient 50 - 74% Note: Goals downgraded after obtaining more formal cognitive measures   Problem: RH Memory Goal: LTG Patient will use memory compensatory aids to (SLP) Description: LTG:  Patient will use memory compensatory aids to recall biographical/new, daily complex information with cues (SLP) Flowsheets (Taken 11/08/2021 1731) LTG: Patient will use memory compensatory aids to (SLP): Moderate Assistance - Patient 50 - 74% Note: Goals downgraded after obtaining more formal cognitive measures   Problem: RH Awareness Goal: LTG: Patient will demonstrate awareness during functional activites type of (SLP) Description: LTG: Patient will demonstrate awareness during functional activites type of (SLP) Flowsheets (Taken 11/08/2021 1731) LTG: Patient will demonstrate awareness during cognitive/linguistic activities with assistance of (SLP): Moderate Assistance - Patient 50 - 74% Note: Goals downgraded after obtaining more formal cognitive measures

## 2021-11-08 NOTE — Progress Notes (Signed)
Speech Language Pathology Daily Session Note  Patient Details  Name: Cassandra Dodson MRN: [id number] Date of Birth: 16-May-1940  Today's Date: 11/08/2021 SLP Individual Time: 1415-1500 SLP Individual Time Calculation (min): 45 min  Short Term Goals: Week 1: SLP Short Term Goal 1 (Week 1): Patient will consume current diet with minimal overt s/sx of aspiration and with min A verbal cues to implement small bites/sips, single sips. SLP Short Term Goal 2 (Week 1): Patient will consume PO trials of ice chips and/or thin liquids with minimal overt s/sx of aspiration to indicate readiness for instrumental swallow evaluation. SLP Short Term Goal 3 (Week 1): Patient will orient x4 with max A for use of external aids SLP Short Term Goal 3 - Progress (Week 1): Other (comment) (revised) SLP Short Term Goal 4 (Week 1): Patient will improve intellectual awareness by identifying at least 2 changes (e.g., cognitive, speech, swallowing, physical) with max A verbal and contextual cues SLP Short Term Goal 4 - Progress (Week 1): Other (comment) (revised) SLP Short Term Goal 5 (Week 1): Patient will demonstrate functional recall by using internal/external memory strategies/aids with max A verbal/visual cues SLP Short Term Goal 5 - Progress (Week 1): Other (comment) (revised) SLP Short Term Goal 6 (Week 1): Patient will identify up to 2 uses for call bell with max A verbal/visual cues SLP Short Term Goal 6 - Progress (Week 1): Discontinued (comment) (initial goal discontinued; new goal made)  Skilled Therapeutic Interventions: Skilled ST treatment focused on cognitive-communication goals. Patient was sleeping in bed on arrival and initially resistant to ST intervention prior to learning it would be appropriate to remain in bed for duration of session. Pt agreeable to further cognitive-linguistic evaluation.   The [facility] Mental Status Examination was completed to evaluate the pt's cognitive-linguistic  skills. Pt achieved a score of 1/30 which is below the normal limits of 27 or more out of 30 and is suggestive of a severe impairment.   Pt exhibited significantly delayed processing, decreased focused attention, orientation to time and place [facility]), basic problem solving, intellectual awareness, and diminished recall. Anticipate hard of hearing status contributes to auditory comprehension deficits, however pt appeared to have more difficulty engaging in basic interactions with more confusion as compared to initial encounter. At end of assessment, pt self perceived assessment as "easy" and was very surprised to learn that she had errors on the assessment.   SLP downgraded ST and LTGs after obtaining additional cognitive measures revealing more severe cognitive impairment than initially suspected.  Patient was left in bed with alarm activated and immediate needs within reach at end of session. Continue per current plan of care.       Pain    Therapy/Group: Individual Therapy  Cassandra Dodson 11/08/2021, 5:30 PM

## 2021-11-08 NOTE — Progress Notes (Signed)
Hypoglycemic Event  CBG: 61  Treatment: 4 oz juice/soda  Symptoms: None  Follow-up CBG: [id number] CBG Result:77  Possible Reasons for Event: Unknown  Comments/MD notified:[id number]  Will notify Dan A.,PA  Belva Chimes

## 2021-11-08 NOTE — Progress Notes (Addendum)
PROGRESS NOTE   Subjective/Complaints: She reports no new complaints this morning Discussed with her and nursing her hypoglycemia and discontinuation of ISS to get her on appropriate home dosages  ROS: +hard of hearing. +vasocagal episode this week, denies pain   Objective:   No results found. Recent Labs    11/06/21 0545  WBC 8.2  HGB 11.9*  HCT 34.9*  PLT 163   Recent Labs    11/06/21 0545  NA 133*  K 4.5  CL 104  CO2 22  GLUCOSE 140*  BUN 53*  CREATININE 1.18*  CALCIUM 8.7*    Intake/Output Summary (Last 24 hours) at 11/08/2021 1020 Last data filed at 11/08/2021 5284 Gross per 24 hour  Intake 596 ml  Output --  Net 596 ml     Pressure Injury 11/01/21 Sacrum Mid Stage 1 -  Intact skin with non-blanchable redness of a localized area usually over a bony prominence. (Active)  11/01/21 1750  Location: Sacrum  Location Orientation: Mid  Staging: Stage 1 -  Intact skin with non-blanchable redness of a localized area usually over a bony prominence.  Wound Description (Comments):   Present on Admission: Yes    Physical Exam: Vital Signs Blood pressure 126/64, pulse 74, temperature 98.7 F (37.1 C), temperature source Oral, resp. rate 16, height '5\' 5"'$  (1.651 m), weight 81 kg, SpO2 99 %. Gen: no distress, normal appearing, somnolent HEENT: oral mucosa pink and moist, NCAT, hard of hearing Cardio: Reg rate Chest: normal effort, normal rate of breathing Abd: soft, non-distended Ext: no edema Psych: pleasant, normal affect Skin: mild stage 1 on sacrum Neurological:     Comments: Patient is alert.  Mild dysarthria.  Oriented to self and place.  Follows simple commands.  Limited overall medical historian. Cranial nerves II through XII intact, motor strength is 5/5 in RIght and 4- Left deltoid, bicep, tricep, grip,5/5 Right and 2- left  hip flexor, knee extensors, ankle dorsiflexor and plantar flexor Sensory  exam normal sensation to light touch and proprioception in bilateral upper and lower extremities   Assessment/Plan: 1. Functional deficits which require 3+ hours per day of interdisciplinary therapy in a comprehensive inpatient rehab setting. Physiatrist is providing close team supervision and 24 hour management of active medical problems listed below. Physiatrist and rehab team continue to assess barriers to discharge/monitor patient progress toward functional and medical goals  Care Tool:  Bathing    Body parts bathed by patient: Right arm, Left arm, Chest, Abdomen, Right upper leg, Left upper leg, Face   Body parts bathed by helper: Front perineal area, Buttocks, Right lower leg, Left lower leg     Bathing assist Assist Level: 2 Helpers     Upper Body Dressing/Undressing Upper body dressing   What is the patient wearing?: Pull over shirt    Upper body assist Assist Level: Maximal Assistance - Patient 25 - 49%    Lower Body Dressing/Undressing Lower body dressing      What is the patient wearing?: Incontinence brief     Lower body assist Assist for lower body dressing: 2 Helpers     Toileting Toileting    Toileting assist Assist for toileting: Dependent -  Patient 0%     Transfers Chair/bed transfer  Transfers assist  Chair/bed transfer activity did not occur: Safety/medical concerns  Chair/bed transfer assist level: Maximal Assistance - Patient 25 - 49%     Locomotion Ambulation   Ambulation assist      Assist level: Maximal Assistance - Patient 25 - 49% Assistive device: Parallel bars Max distance: 54f   Walk 10 feet activity   Assist  Walk 10 feet activity did not occur: Safety/medical concerns        Walk 50 feet activity   Assist Walk 50 feet with 2 turns activity did not occur: Safety/medical concerns         Walk 150 feet activity   Assist Walk 150 feet activity did not occur: Safety/medical concerns         Walk 10  feet on uneven surface  activity   Assist Walk 10 feet on uneven surfaces activity did not occur: Safety/medical concerns         Wheelchair     Assist Is the patient using a wheelchair?: Yes Type of Wheelchair: Manual    Wheelchair assist level: Dependent - Patient 0% Max wheelchair distance: 025f   Wheelchair 50 feet with 2 turns activity    Assist        Assist Level: Dependent - Patient 0%   Wheelchair 150 feet activity     Assist      Assist Level: Dependent - Patient 0%   Blood pressure 126/64, pulse 74, temperature 98.7 F (37.1 C), temperature source Oral, resp. rate 16, height '5\' 5"'$  (1.651 m), weight 81 kg, SpO2 99 %.    Medical Problem List and Plan: 1. 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  Impaired mobility, working on transfers -Continue Lovenox             -antiplatelet therapy: Aspirin 81 mg daily and Plavix 75 mg day x3 to 6 months 3. Pain Management: Tylenol as needed 4. Mood/Behavior/Sleep: Xanax 0.25-0.5 mg nightly as needed             -antipsychotic agents: N/A 5. Neuropsych/cognition: This patient is capable of making decisions on her own behalf. 6. Skin/Wound Care: Routine skin checks 7. Fluids/Electrolytes/Nutrition: Routine in and outs with follow-up chemistries 8.  Left frontal large meningioma.  Follow-up neurosurgery Dr. ElEllene Route Plan for possible surgical resection 8-12 weeks.  Continue Decadron therapy. 9.  Hypertension.  improved, continue Norvasc 10 mg daily, increase Avapro to 150 mg daily.  Monitor with increased mobility 10.  Diabetes mellitus.  Hemoglobin A1c 7.0. Increase Semglee to 34 units daily for hyperglycemia.  Monitor while on Decadron therapy. D/c ISS for hypoglycemia 11.  Hyperlipidemia.   Fenofibrate/Crestor 12.  CKD stage III.  Latest creatinine 1.18 on 11/06/21.  Baseline 1.22-1.69.  Cr at baseline, monitor weekly. 13.  History of endometrial cancer.  Follow-up outpatient 14.  GERD.  continue Protonix 15. Vasovagal episode 7/18: placed teds hose on only with therapy, discussed with therapy 16. Overweight BMI 29.72: provide list of foods to help with weight loss 17. Impaired cognition: she has a history of being a university professor, will need 24/7 supervision upon d/c, discussed with son who  is a retired Social research officer, government,  plan for brain surgery for tumor resection.  18. Dysphagia: will need MBS 19. Neurogenic bowel and bladder: bladder program 20. Hard of hearing: needs hearing aides, will need to ask son to bring from home.      LOS: 3 days A FACE TO FACE EVALUATION WAS PERFORMED  Jevon Littlepage P Karrissa Parchment 11/08/2021, 10:20 AM

## 2021-11-08 NOTE — Progress Notes (Signed)
Occupational Therapy Session Note  Patient Details  Name: Cassandra Dodson MRN: [id number] Date of Birth: [birth year]  Today's Date: 11/08/2021 OT Individual Time: 1303-1404 OT Individual Time Calculation (min): 61 min    Short Term Goals: Week 1:  OT Short Term Goal 1 (Week 1): Patient will identify need to void and have at least 1 continent episode OT Short Term Goal 2 (Week 1): Patient will transfer to [facility] with mod assist OT Short Term Goal 3 (Week 1): Patient will dress upper body with min assist (excluding bra) OT Short Term Goal 4 (Week 1): Patient will dress lower body with max assist OT Short Term Goal 5 (Week 1): Patient will bathe upper body with min assist following set up  Skilled Therapeutic Interventions/Progress Updates:  Pt awake up in recliner upon OT arrival to the room. Pt reports, "I don't want no occupational therapy. I'm tired." Pt in agreement for OT session after OT provides education that OT can provide assist with transferring back to bed at end of session.   Therapy Documentation Precautions:  Precautions Precautions: Fall Precaution Comments: incontinent, contact precautions, Left hemi, watch BP Restrictions Weight Bearing Restrictions: No Vital Signs: Please see "Flowsheet" for most recent vitals charted by nursing staff. Pain: Pain Assessment Pain Scale: 0-10 Pain Score: 0-No pain ADL: Lower Body Dressing: Dependent (Pt requires total assistance to doff and don pants at bed level.) Where Assessed-Lower Body Dressing: Bed level Toileting: Maximal assistance (Pt able to participate in toileting hygiene in front peri-area. However, requires maximal assistance for thoroughness and to change brief due to incontinence of bowel & bladder (pt unaware).) Where Assessed-Toileting: Bed level ADL Comments: Pt requires maximal assistance to perform SPT to R from recliner > EOB. Pt able to maintain sitting at EOB for short period ~30 seconds with CGA prior to  returning supine. Pt requires maximal assistance to complete sit > supine transfer. Once in bed, it was noted that pt's brief was soiled. Pt requires moderate assistance for rolling to B sides in bed and maximal - total assistance for toileting tasks and LB dressing due to pants being soiled as well.  Sitting Balance: Pt participates in dynamic sitting task while seated in recliner with decreased posterior/back support in order to improve postureal control needed for increased independence with mobility/ADL's. Pt participates in reaching in all functional quadrants with RUE & LUE while crossing midline to challenge sitting balance without posterior support. Pt able to maintain sitting balance with close supervision while in the recliner.   LUE Neuro Re-Education: Pt participates in LUE neuro re-education in order to improve motor control in LUE which is needed for improved independence in ADL's. Pt able to participate in a functional reaching task to cross midline and reach in all quadrants with limited shoulder flexion noted and using core movements to compensate. However, pt does demo functional elbow flexion/extension needed to reach object. Education provided to pt on importance of performing repetitions and functional movements with LUE in order to improve motor control. Pt verbalizes understanding.   Visual Perceptual skills & L Attention: Pt participates in visual perceptual tasks to improve L sided attention. Pt able to track therapist's face into the L visual field at approx 45 degrees into the L visual field. However, pt is unable to perform visual pursuits to track past this position or sustain visual fixation >5 secs in far L visual field (past 45 degree angle). Pt noted to have minimal horizontal nystagmus with fixation in the L visual  field.   Pt returned to bed at end of session. Pt left resting comfortably in bed with personal belongings and call light within reach, bed alarm on and  activated, bed in low position, 3 bed rails up, and comfort needs attended to.    Therapy/Group: Individual Therapy  Barbee Shropshire 11/08/2021, 4:56 PM

## 2021-11-08 NOTE — Progress Notes (Signed)
Nasal swab completed and tolerated well by patient for Covid , sent to lab

## 2021-11-08 NOTE — Progress Notes (Signed)
Notified Cassandra Dodson, regarding cbg results 310,taken by staff early and will be repeated at regular schedule time. Patient is symptomatic.     11/08/21 2230 Negative SARS Coronavirus results

## 2021-11-08 NOTE — Progress Notes (Signed)
Physical Therapy Session Note  Patient Details  Name: Cassandra Dodson MRN: [id number] Date of Birth: 1940/06/24  Today's Date: 11/08/2021 PT Individual Time: 0900-0959 + 1500-1530 PT Individual Time Calculation (min): 59 min  + 30 min  Short Term Goals: Week 1:  PT Short Term Goal 1 (Week 1): Pt will complete bed mobility with modA PT Short Term Goal 2 (Week 1): Pt will complete bed<>chair transfers with modA and LRAD PT Short Term Goal 3 (Week 1): Pt will ambulate 68f with modA and LRAD PT Short Term Goal 4 (Week 1): Pt will participate in functional outcome measure to assess falls risk  Skilled Therapeutic Interventions/Progress Updates:      1st session: Pt supine in bed to start. Agreeable to therapy and denies pain. Delayed processing and initiation throughout session. Pt noted to be incontinent of bowel on arrival, unaware. Bed level brief change and pericare completed with dependant assist. She can roll to her R with modA and requires max/totalA for rolling L. It's difficult for her to achieve full sidelying and requires linen to assist with managing hips. Noted redness at gluteal cleft during pericare - moisture cream applied and LPN notified via secure chat. Replaced her standard foam cushion with ROHO to promote skin integrity while sitting in w/c.   Supine<>Sitting EOB with totalA (< 25%) with hospital bed features - pt with motor planning deficits and lacks sufficient trunk control/strength to assist. Requires minA for static sitting with x2 large LOB posteriorly that she was unable to self correct.   Completed squat<>pivot transfer with mod/maxA from EOB to w/c, towards her weaker L side. She struggled significantly with repositioning in w/c and required total to dependant assist for scooting hips posteriorly to better position. She slumps and sacral sits in w/c.   Transported to day room rehab gym and setup in [place] resistance set to 70cm/sec - required tactile and verbal  cueing for initiation and sequencing, very very slow cadence despite cues to increase. Also needed cues for completing full ROM and activation of posterior chain.   Returned to her room and assisted to recliner via squat<>pivot transfer, maxA. Required totalA for repositioning in recliner. BLE elevated and safety belt alarm on. Call bell in lap with all needs met.    2nd session: Pt supine in bed to start. She's agreeable to therapy but when asked what she would like to work on to address patient centered goals, she reports "answer questions." Explained to her PT POC and the benefits of mobilizing to address her LTG of "being well." Pt ultimately agreeable with encouragement.  Supine<>sitting with HOB raised - allowed +++ time for processing and motor planning. She required totalA (pt 25%) for completing transition and totalA for scooting forward to EOB.  Focused remainder of session on sit<>stands and standing tolerance.   Instructed in 2x5 sit<>stands from slightly raised EOB height - requiring overall minA for each stand with mod cues for standing "tall" and facilitation for anterior pelvic tilt.   1x3 sit<>stands from perched position on Stedy with CGA - focused on standing tolerance and postural awareness/control. Able to stand in [place] and able to stand for up to 1 minute max per stand. Pt quickly "sinks" in standing after a few seconds and lacks awareness to correct - max cues (verbal and visual) to regain "tall" standing posture.  Returned to bed and pt requiring totalA for sit>supine for BLE management. Pillows provided for support in bed. All needs met, bed  alarm on. NT entering room for routine vitals.    Therapy Documentation Precautions:  Precautions Precautions: Fall Precaution Comments: incontinent, contact precautions, Left hemi, watch BP Restrictions Weight Bearing Restrictions: No General:    Therapy/Group: Individual Therapy  Lyndell Allaire P Eden Rho 11/08/2021,  7:24 AM

## 2021-11-09 DIAGNOSIS — E1140 Type 2 diabetes mellitus with diabetic neuropathy, unspecified: Secondary | ICD-10-CM | POA: Diagnosis not present

## 2021-11-09 DIAGNOSIS — I10 Essential (primary) hypertension: Secondary | ICD-10-CM | POA: Diagnosis not present

## 2021-11-09 DIAGNOSIS — E785 Hyperlipidemia, unspecified: Secondary | ICD-10-CM | POA: Diagnosis not present

## 2021-11-09 DIAGNOSIS — I6350 Cerebral infarction due to unspecified occlusion or stenosis of unspecified cerebral artery: Secondary | ICD-10-CM | POA: Diagnosis not present

## 2021-11-09 LAB — GLUCOSE, CAPILLARY
Glucose-Capillary: 204 mg/dL — ABNORMAL HIGH (ref 70–99)
Glucose-Capillary: 275 mg/dL — ABNORMAL HIGH (ref 70–99)
Glucose-Capillary: 302 mg/dL — ABNORMAL HIGH (ref 70–99)
Glucose-Capillary: 302 mg/dL — ABNORMAL HIGH (ref 70–99)

## 2021-11-09 MED ORDER — DEXAMETHASONE 4 MG PO TABS: 2.0000 mg | ORAL_TABLET | Freq: Two times a day (BID) | ORAL | Status: DC

## 2021-11-09 MED ORDER — INSULIN ASPART 100 UNIT/ML IJ SOLN
0.0000 [IU] | Freq: Every day | INTRAMUSCULAR | Status: DC
  Administered 2021-11-09: 4 [IU] via SUBCUTANEOUS
  Administered 2021-11-11: 3 [IU] via SUBCUTANEOUS
  Administered 2021-11-13 – 2021-11-14 (×2): 2 [IU] via SUBCUTANEOUS

## 2021-11-09 MED ORDER — INSULIN GLARGINE-YFGN 100 UNIT/ML ~~LOC~~ SOLN
35.0000 [IU] | Freq: Every day | SUBCUTANEOUS | Status: DC
  Administered 2021-11-09 – 2021-11-11 (×3): 35 [IU] via SUBCUTANEOUS
  Filled 2021-11-09 (×4): qty 0.35

## 2021-11-09 MED ORDER — INSULIN ASPART 100 UNIT/ML IJ SOLN
0.0000 [IU] | Freq: Three times a day (TID) | INTRAMUSCULAR | Status: DC
  Administered 2021-11-09: 4 [IU] via SUBCUTANEOUS
  Administered 2021-11-10 – 2021-11-12 (×4): 1 [IU] via SUBCUTANEOUS

## 2021-11-09 MED ORDER — DEXAMETHASONE 2 MG PO TABS
2.0000 mg | ORAL_TABLET | Freq: Two times a day (BID) | ORAL | Status: AC
  Administered 2021-11-09 – 2021-11-12 (×6): 2 mg via ORAL
  Filled 2021-11-09 (×6): qty 1

## 2021-11-09 NOTE — Progress Notes (Signed)
Speech Language Pathology Daily Session Note  Patient Details  Name: Cassandra Dodson MRN: [id number] Date of Birth: March 24, 1941  Today's Date: 11/09/2021 SLP Individual Time: 1420-1500 SLP Individual Time Calculation (min): 40 min  Short Term Goals: Week 1: SLP Short Term Goal 1 (Week 1): Patient will consume current diet with minimal overt s/sx of aspiration and with min A verbal cues to implement small bites/sips, single sips. SLP Short Term Goal 2 (Week 1): Patient will consume PO trials of ice chips and/or thin liquids with minimal overt s/sx of aspiration to indicate readiness for instrumental swallow evaluation. SLP Short Term Goal 3 (Week 1): Patient will orient x4 with max A for use of external aids SLP Short Term Goal 3 - Progress (Week 1): Other (comment) (revised) SLP Short Term Goal 4 (Week 1): Patient will improve intellectual awareness by identifying at least 2 changes (e.g., cognitive, speech, swallowing, physical) with max A verbal and contextual cues SLP Short Term Goal 4 - Progress (Week 1): Other (comment) (revised) SLP Short Term Goal 5 (Week 1): Patient will demonstrate functional recall by using internal/external memory strategies/aids with max A verbal/visual cues SLP Short Term Goal 5 - Progress (Week 1): Other (comment) (revised) SLP Short Term Goal 6 (Week 1): Patient will identify up to 2 uses for call bell with max A verbal/visual cues SLP Short Term Goal 6 - Progress (Week 1): Discontinued (comment) (initial goal discontinued; new goal made)  Skilled Therapeutic Interventions: Skilled ST treatment focused on cognitive and swallowing goals. Pt agreeable to ST intervention. Pt performed oral care with set-up A. Pt accepted ice chips and tsp of thin liquid (water only) without overt s/sx of aspiration. Pt demonstrated ability to take small, controlled sips by cup with sup A verbal cues without immediate s/sx of aspiration such as coughing or throat clearing, however  exhibited considerable wet vocal quality post swallows. Pt executed cough response per verbal cue however unable to initiate volitional swallow and attempts to clear appeared ineffective. Pt evenatually able to initiate swallow response which improved vocal quality. Further PO trials were discontinued. Cont dys 3 diet with nectar thick liquids at this time.   SLP facilitated orientation to time with sup A verbal cues for accuracy; max A for orientation to place and situation. Pt continues to confuse location for [place]. Pt was total A for intellectual awareness of deficits. Even with max A contextual cues, pt denied any physical or cognitive changes. Pt provided many off topic comments throughout and required max A verbal redirection for attention to task for ~1 minute intervals.   SLP facilitated basic verbal reasoning task by naming 1 item in familiar categories. Pt completed task with mod-to-max A in regards to sustained attention and benefited from examples to generate items. Hard of hearing status appeared to interfere with understanding of task. SLP attempted use of written cues however inconsistently effective due to prevalence of distractibility.  Patient was left in recliner with alarm activated and immediate needs within reach at end of session. Continue per current plan of care.       Pain  None/denied  Therapy/Group: Individual Therapy  Patty Sermons 11/09/2021, 2:56 PM

## 2021-11-09 NOTE — Progress Notes (Addendum)
PROGRESS NOTE   Subjective/Complaints: Discussed pending COVID results She feels well, fatigued which is not new for her No new complaints  ROS: +hard of hearing. +vasovagal episode this week, denies pain, +fatigue   Objective:   No results found. No results for input(s): "WBC", "HGB", "HCT", "PLT" in the last 72 hours.  No results for input(s): "NA", "K", "CL", "CO2", "GLUCOSE", "BUN", "CREATININE", "CALCIUM" in the last 72 hours.   Intake/Output Summary (Last 24 hours) at 11/09/2021 1046 Last data filed at 11/08/2021 1747 Gross per 24 hour  Intake 480 ml  Output --  Net 480 ml     Pressure Injury 11/01/21 Sacrum Mid Stage 1 -  Intact skin with non-blanchable redness of a localized area usually over a bony prominence. (Active)  11/01/21 1750  Location: Sacrum  Location Orientation: Mid  Staging: Stage 1 -  Intact skin with non-blanchable redness of a localized area usually over a bony prominence.  Wound Description (Comments):   Present on Admission: Yes    Physical Exam: Vital Signs Blood pressure 136/78, pulse 74, temperature 99.3 F (37.4 C), temperature source Oral, resp. rate 16, height '5\' 5"'$  (1.651 m), weight 81 kg, SpO2 100 %. Gen: no distress, normal appearing, fatigued HEENT: oral mucosa pink and moist, NCAT, hard of hearing Cardio: Reg rate Chest: normal effort, normal rate of breathing Abd: soft, non-distended Ext: no edema Psych: pleasant, normal affect Skin: mild stage 1 on sacrum Neurological:     Comments: Patient is alert.  Mild dysarthria.  Oriented to self and place.  Follows simple commands.  Limited overall medical historian. Cranial nerves II through XII intact, motor strength is 5/5 in RIght and 4- Left deltoid, bicep, tricep, grip,5/5 Right and 2- left  hip flexor, knee extensors, ankle dorsiflexor and plantar flexor Sensory exam normal sensation to light touch and proprioception in  bilateral upper and lower extremities   Assessment/Plan: 1. Functional deficits which require 3+ hours per day of interdisciplinary therapy in a comprehensive inpatient rehab setting. Physiatrist is providing close team supervision and 24 hour management of active medical problems listed below. Physiatrist and rehab team continue to assess barriers to discharge/monitor patient progress toward functional and medical goals  Care Tool:  Bathing    Body parts bathed by patient: Right arm, Left arm, Chest, Abdomen, Right upper leg, Left upper leg, Face   Body parts bathed by helper: Front perineal area, Buttocks, Right lower leg, Left lower leg     Bathing assist Assist Level: 2 Helpers     Upper Body Dressing/Undressing Upper body dressing   What is the patient wearing?: Pull over shirt    Upper body assist Assist Level: Maximal Assistance - Patient 25 - 49%    Lower Body Dressing/Undressing Lower body dressing      What is the patient wearing?: Incontinence brief     Lower body assist Assist for lower body dressing: 2 Helpers     Toileting Toileting    Toileting assist Assist for toileting: Dependent - Patient 0%     Transfers Chair/bed transfer  Transfers assist  Chair/bed transfer activity did not occur: Safety/medical concerns  Chair/bed transfer assist level: Maximal Assistance -  Patient 25 - 49%     Locomotion Ambulation   Ambulation assist      Assist level: Maximal Assistance - Patient 25 - 49% Assistive device: Parallel bars Max distance: 83f   Walk 10 feet activity   Assist  Walk 10 feet activity did not occur: Safety/medical concerns        Walk 50 feet activity   Assist Walk 50 feet with 2 turns activity did not occur: Safety/medical concerns         Walk 150 feet activity   Assist Walk 150 feet activity did not occur: Safety/medical concerns         Walk 10 feet on uneven surface  activity   Assist Walk 10 feet on  uneven surfaces activity did not occur: Safety/medical concerns         Wheelchair     Assist Is the patient using a wheelchair?: Yes Type of Wheelchair: Manual    Wheelchair assist level: Dependent - Patient 0% Max wheelchair distance: 042f   Wheelchair 50 feet with 2 turns activity    Assist        Assist Level: Dependent - Patient 0%   Wheelchair 150 feet activity     Assist      Assist Level: Dependent - Patient 0%   Blood pressure 136/78, pulse 74, temperature 99.3 F (37.4 C), temperature source Oral, resp. rate 16, height '5\' 5"'$  (1.651 m), weight 81 kg, SpO2 100 %.    Medical Problem List and Plan: 1. 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  Impaired mobility, working on transfers -Continue Lovenox             -antiplatelet therapy: Aspirin 81 mg daily and Plavix 75 mg day x3 to 6 months 3. Pain Management: Tylenol as needed 4. Anxiety: continue Xanax 0.25-0.5 mg nightly as needed             -antipsychotic agents: N/A 5. Neuropsych/cognition: This patient is capable of making decisions on her own behalf. 6. Skin/Wound Care: Routine skin checks 7. Fluids/Electrolytes/Nutrition: Routine in and outs with follow-up chemistries 8.  Left frontal large meningioma.  Follow-up neurosurgery Dr. ElEllene Route Plan for possible surgical resection 8-12 weeks.  Continue Decadron therapy. Will need to determine taper, discussed with Dan to follow-up next week 9.  Hypertension.  improved, continue Norvasc 10 mg daily, increase Avapro to 150 mg daily.  Monitor with increased mobility 10.  Diabetes mellitus.  Hemoglobin A1c 7.0. Increase Semglee to 35 units daily for hyperglycemia.  Monitor while on Decadron  therapy. D/c ISS for hypoglycemia 11.  Hyperlipidemia.  Fenofibrate/Crestor 12.  CKD stage III.  Latest creatinine 1.18 on 11/06/21.  Baseline 1.22-1.69.  Cr at baseline, monitor weekly. 13.  History of endometrial cancer.  Follow-up outpatient 14.  GERD.  continue Protonix 15. Vasovagal episode 7/18: placed teds hose on only with therapy, discussed with therapy 16. Overweight BMI 29.72: provide dietary education 17. Impaired cognition: she has a history of being a university professor, will need 24/7 supervision upon d/c, discussed with son who is a retired maSocial research officer, government plan for brain surgery for tumor resection.  18. Dysphagia: will need MBS 19. Neurogenic bowel and bladder: bladder program 20. Hard of hearing: needs hearing aides, will need to ask son to bring from home.      LOS: 4 days A FACE TO FACE EVALUATION WAS PERFORMED  Clide Deutscher Jovana Rembold 11/09/2021, 10:46 AM

## 2021-11-09 NOTE — Plan of Care (Signed)
Physical Therapy Session Note  Patient Details  Name: RAJANEE SCHUELKE MRN: [id number] Date of Birth: 12/04/1940  Pt's plan of care adjusted to 15/7 after speaking with care team and discussed with MD in team conference as pt currently unable to tolerate current therapy schedule with OT, PT, and SLP.    Ruslan Mccabe P Elliett Guarisco PT 11/09/2021, 12:05 PM

## 2021-11-09 NOTE — Progress Notes (Signed)
Occupational Therapy Session Note  Patient Details  Name: Cassandra Dodson MRN: [id number] Date of Birth: July 14, 1940  Today's Date: 11/09/2021 OT Individual Time: 0902-1015 OT Individual Time Calculation (min): 73 min    Short Term Goals: Week 1:  OT Short Term Goal 1 (Week 1): Patient will identify need to void and have at least 1 continent episode OT Short Term Goal 2 (Week 1): Patient will transfer to [facility] with mod assist OT Short Term Goal 3 (Week 1): Patient will dress upper body with min assist (excluding bra) OT Short Term Goal 4 (Week 1): Patient will dress lower body with max assist OT Short Term Goal 5 (Week 1): Patient will bathe upper body with min assist following set up  Skilled Therapeutic Interventions/Progress Updates:    Patient received supine in bed.  Greeted patient asking if she wanted to get up out of bed - patient stated no.  Took several moments and coaxing to have patient agree to get out of bed to get cleaned up and change of clothing.  Patient incontinent of large amount of loose stool, patient unaware.  Patient assisted to roll left/right multiple times to get cleaned and new brief, new bedding.  Patient assisted into pants and used rolling to aide with pulling pants over hips.  Patient did not participate in dressing activity - but was able to help roll herself form side to side with assistance.   Patient assisted to sit at edge of bed, then transferred to wheelchair with total assist.  Pillow placed in back of wheelchair to encourage more upright posture.  Patient transferred to sink to complete upper body bathing and grooming.  Patient transferred back to bed using Stedy lift and max assist.  Patient able to transition sit to stand in lift - and showing slight improvement in ability to remain upright in lift - partial standing position.   Patient put back in bed as it was felt to be unsafe to leave her up in wheelchair for the hour until next therapy.   Bed alarm  engaged, and call bell within reach.    Therapy Documentation Precautions:  Precautions Precautions: Fall Precaution Comments: L hemi, incontinent Restrictions Weight Bearing Restrictions: No  Pain:  Reports pain in feet - feet swollen and bruised L>R - Nursing informed.        Therapy/Group: Individual Therapy  Mariah Milling 11/09/2021, 1:04 PM

## 2021-11-09 NOTE — Progress Notes (Signed)
Decadron has been tapered to 2 mg every 12 hours x3 days and stop.  Will need close monitoring of blood sugars and adjust insulin as needed.

## 2021-11-09 NOTE — Progress Notes (Signed)
Physical Therapy Session Note  Patient Details  Name: Cassandra Dodson MRN: [id number] Date of Birth: [birth year]  Today's Date: 11/09/2021 PT Individual Time: 7121-9758 + 8325-4982 PT Individual Time Calculation (min): 10 min  + 42 min Short Term Goals: Week 1:  PT Short Term Goal 1 (Week 1): Pt will complete bed mobility with modA PT Short Term Goal 2 (Week 1): Pt will complete bed<>chair transfers with modA and LRAD PT Short Term Goal 3 (Week 1): Pt will ambulate 34f with modA and LRAD PT Short Term Goal 4 (Week 1): Pt will participate in functional outcome measure to assess falls risk  Skilled Therapeutic Interventions/Progress Updates:      1st session: Upon entrance into the room, pt reports she can't do therapy due to fatigue. Instructed her in simple bed level there-ex to encourage strengthening and mobility. Pt falling asleep while completing exercises. Unable to sufficiently complete and she stated "I quit" when prompted.   Pt repositioned in bed, concluded session with bed in chair position. Bed alarm on. She missed 20 minutes due to fatigue.   2nd session: Pt supine in bed to start. Her son was leaving the room upon entrance. Pt agreeable to therapy but requires encouragement.  Supine<>Sitting EOB with maxA with hospital bed features. She requires totalA for forward scooting to EOB. Delayed initiation and processing for simple mobility.   Requires minA for static sitting EOB with very poor postural awareness and control. Completed squat<>pivot transfer to w/c but w/c moving (despite brakes being locked) and therefore required dependant lift back to bed due to safety concerns. Once returned to bed, noted pt to be incontinent of bowel/bladder.  Returned to supine position with totalA. Bed level brief change and pericare. She can roll to her L side with modA and requires totalA for rolling to her R. Soiled linen from incontinence. ++ time for clean up and managing without +2 assist.    Returned to EOB in similar manner and used Stedy for safely to transfer her back to w/c. She can rise to stand in [place] minA and can sit in perched position with CGA. Donned pants with totalA and requires dependant assist for pulling them up in standing in [place]   Worked on sit<>stands using Stedy to improve strength, activity tolerance, and carryover into functional transfers. Able to complete 1x5 with minA for the first 3 and modA for the last 2, fatigue related. Pt refusing any further mobilty training and wanting to rest. Stedy transfer to the recliner and pt positioned with pillows to support. Safety belt alarm on, call bell in lap, all needs met.     Therapy Documentation Precautions:  Precautions Precautions: Fall Precaution Comments: incontinent, contact precautions, Left hemi, watch BP Restrictions Weight Bearing Restrictions: No General:    Therapy/Group: Individual Therapy  [name]7/21/2023, 7:32 AM

## 2021-11-10 DIAGNOSIS — E1169 Type 2 diabetes mellitus with other specified complication: Secondary | ICD-10-CM

## 2021-11-10 DIAGNOSIS — E669 Obesity, unspecified: Secondary | ICD-10-CM

## 2021-11-10 LAB — GLUCOSE, CAPILLARY
Glucose-Capillary: 140 mg/dL — ABNORMAL HIGH (ref 70–99)
Glucose-Capillary: 87 mg/dL (ref 70–99)
Glucose-Capillary: 193 mg/dL — ABNORMAL HIGH (ref 70–99)
Glucose-Capillary: 138 mg/dL — ABNORMAL HIGH (ref 70–99)

## 2021-11-10 NOTE — Progress Notes (Signed)
Occupational Therapy Session Note  Patient Details  Name: Cassandra Dodson MRN: [id number] Date of Birth: [birth year]  Today's Date: 11/10/2021 OT Individual Time: 0915-1000 & 1415-1445 OT Individual Time Calculation (min): 45 min & 30 min   Short Term Goals: Week 1:  OT Short Term Goal 1 (Week 1): Patient will identify need to void and have at least 1 continent episode OT Short Term Goal 2 (Week 1): Patient will transfer to [facility] with mod assist OT Short Term Goal 3 (Week 1): Patient will dress upper body with min assist (excluding bra) OT Short Term Goal 4 (Week 1): Patient will dress lower body with max assist OT Short Term Goal 5 (Week 1): Patient will bathe upper body with min assist following set up  Skilled Therapeutic Interventions/Progress Updates:  Session 1:   Upon OT arrival, pt supine in bed resting. Pt easily wakened and agreeable to OT treatment session. Pt reports no pain. Pt's TED hose donned with Max A and when asked if pt had a BM, pt says "No" but pt did have incontinence of bowels. Pt requires Max A x 2 to clean up and change brief bed level. Pt completes supine to sit transfer with Mod A and sits EOB to donn socks with Mod A. Pt requires verbal cues to correct posture and sit upright in between trials. Pt sits EOB with Min A-CGA. Pt engages in dynamic sitting balance task reaching in all planes using B UE for 2x10 reps. Pt with no reports of dizziness during session. Pt returns to supine with Max A and was scooted up to [facility] with Total A. Pt left in bed with all needs met at end of session.   Session 2: Therapist late 15 minutes secondary to being with another pt. Pt missed 15 minutes of OT treatment. Pt in bed upon OT arrival. Pt agreeable to OT treatment session and reports no pain. Treatment intervention with a focus on functional transfers, LE strengthening, standing tolerance, and self care retraining. Pt completes supine to sit transfer with Max A and requires greater  assistance to sit up EOB with Mod A-Min A. Pt engages in functional transfer training with Denna Haggard for 1x6 reps noting pt's brief soiled and requires Max A to change. Pt then completes scoot pivot transfer from bed to w/c with Max A x 1 and sits in w/c to complete LE exercises for 2x10 reps inclduing hip flex/ext and leg flex/ext. Increased effort required for the L LE. Pt completes scoot pivot transfer back to bed with Max A x 1 and sit to supine transfer with Max A. Pt was repositioned in bed with all needs met and safety measures in place.   Therapy Documentation Precautions:  Precautions Precautions: Fall Precaution Comments: L hemi, incontinent Restrictions Weight Bearing Restrictions: No   Therapy/Group: Individual Therapy  [name] 11/10/2021, 10:51 AM

## 2021-11-10 NOTE — Progress Notes (Signed)
Physical Therapy Session Note  Patient Details  Name: Cassandra Dodson MRN: [id number] Date of Birth: Apr 16, 1941  Today's Date: 11/10/2021 PT Individual Time: 1125-1141 PT Individual Time Calculation (min): 16 min   Short Term Goals: Week 1:  PT Short Term Goal 1 (Week 1): Pt will complete bed mobility with modA PT Short Term Goal 2 (Week 1): Pt will complete bed<>chair transfers with modA and LRAD PT Short Term Goal 3 (Week 1): Pt will ambulate 19f with modA and LRAD PT Short Term Goal 4 (Week 1): Pt will participate in functional outcome measure to assess falls risk   Skilled Therapeutic Interventions/Progress Updates:  Patient supine in bed, asleep on entrance to room. Patient requires time to fully rouse and relates fatigue and declining therapy. Pt educated on need to continue movement in order to continue to progress and improve. Pt agreeable briefly, but then requests to return to supine.    Instructed pt in attempt to initiate NMR with AAROM for LLE. Pt dozing on/ off throughout despite vc for encouragement to continue.  Attempt to encourage sitting EOB for improved balance and BLE strengthening. Pt not willing ot fully participate and relates need to rest.    Pt repositioned in bed for improved comfort maxA. Patient supine  in bed at end of session with brakes locked, bed alarm set, and all needs within reach.   Therapy Documentation Precautions:  Precautions Precautions: Fall Precaution Comments: L hemi, incontinent Restrictions Weight Bearing Restrictions: No General:   Vital Signs:  Pain:  No pain complaint this session.   Therapy/Group: Individual Therapy  JAlger SimonsPT, DPT, CSRS 11/10/2021, 11:27 AM

## 2021-11-10 NOTE — Progress Notes (Signed)
PROGRESS NOTE   Subjective/Complaints: Pt had fair night. Was just waking up when I entered. No problems overnight  ROS: Patient denies fever, rash, sore throat, blurred vision, dizziness, nausea, vomiting, diarrhea, cough, shortness of breath or chest pain, joint or back/neck pain, headache, or mood change.   Objective:   No results found. No results for input(s): "WBC", "HGB", "HCT", "PLT" in the last 72 hours.  No results for input(s): "NA", "K", "CL", "CO2", "GLUCOSE", "BUN", "CREATININE", "CALCIUM" in the last 72 hours.   Intake/Output Summary (Last 24 hours) at 11/10/2021 0931 Last data filed at 11/10/2021 0830 Gross per 24 hour  Intake 620 ml  Output --  Net 620 ml     Pressure Injury 11/01/21 Sacrum Mid Stage 1 -  Intact skin with non-blanchable redness of a localized area usually over a bony prominence. (Active)  11/01/21 1750  Location: Sacrum  Location Orientation: Mid  Staging: Stage 1 -  Intact skin with non-blanchable redness of a localized area usually over a bony prominence.  Wound Description (Comments):   Present on Admission: Yes    Physical Exam: Vital Signs Blood pressure (!) 178/67, pulse 60, temperature 97.6 F (36.4 C), resp. rate 18, height '5\' 5"'$  (1.651 m), weight 81 kg, SpO2 99 %. Constitutional: No distress . Vital signs reviewed. A little slow to arouse this am HEENT: NCAT, EOMI, oral membranes moist Neck: supple Cardiovascular: RRR without murmur. No JVD    Respiratory/Chest: CTA Bilaterally without wheezes or rales. Normal effort    GI/Abdomen: BS +, non-tender, non-distended Ext: no clubbing, cyanosis, or edema Psych: pleasant and cooperative   Skin: mild stage 1 on sacrum Neurological:     Comments: Patient arouses with verbal and tactile stim.  Mild dysarthria.  Oriented to self and place.  Follows simple commands.  Limited overall medical historian. HOH. Cranial nerves II through  XII intact, motor strength is 5/5 in RIght and 4- Left deltoid, bicep, tricep, grip,5/5 Right and 2- left  hip flexor, knee extensors, ankle dorsiflexor and plantar flexor Sensory exam normal sensation to light touch and proprioception in bilateral upper and lower extremities   Assessment/Plan: 1. Functional deficits which require 3+ hours per day of interdisciplinary therapy in a comprehensive inpatient rehab setting. Physiatrist is providing close team supervision and 24 hour management of active medical problems listed below. Physiatrist and rehab team continue to assess barriers to discharge/monitor patient progress toward functional and medical goals  Care Tool:  Bathing    Body parts bathed by patient: Left arm, Chest, Face   Body parts bathed by helper: Right arm, Abdomen, Front perineal area, Buttocks, Right upper leg, Left upper leg, Right lower leg, Left lower leg     Bathing assist Assist Level: Maximal Assistance - Patient 24 - 49%     Upper Body Dressing/Undressing Upper body dressing   What is the patient wearing?: Pull over shirt    Upper body assist Assist Level: Moderate Assistance - Patient 50 - 74%    Lower Body Dressing/Undressing Lower body dressing      What is the patient wearing?: Pants, Incontinence brief     Lower body assist Assist for lower body  dressing: Total Assistance - Patient < 25%     Toileting Toileting    Toileting assist Assist for toileting: Dependent - Patient 0%     Transfers Chair/bed transfer  Transfers assist  Chair/bed transfer activity did not occur: Safety/medical concerns  Chair/bed transfer assist level: Maximal Assistance - Patient 25 - 49%     Locomotion Ambulation   Ambulation assist      Assist level: Maximal Assistance - Patient 25 - 49% Assistive device: Parallel bars Max distance: 10f   Walk 10 feet activity   Assist  Walk 10 feet activity did not occur: Safety/medical concerns        Walk  50 feet activity   Assist Walk 50 feet with 2 turns activity did not occur: Safety/medical concerns         Walk 150 feet activity   Assist Walk 150 feet activity did not occur: Safety/medical concerns         Walk 10 feet on uneven surface  activity   Assist Walk 10 feet on uneven surfaces activity did not occur: Safety/medical concerns         Wheelchair     Assist Is the patient using a wheelchair?: Yes Type of Wheelchair: Manual    Wheelchair assist level: Dependent - Patient 0% Max wheelchair distance: 069f   Wheelchair 50 feet with 2 turns activity    Assist        Assist Level: Dependent - Patient 0%   Wheelchair 150 feet activity     Assist      Assist Level: Dependent - Patient 0%   Blood pressure (!) 178/67, pulse 60, temperature 97.6 F (36.4 C), resp. rate 18, height '5\' 5"'$  (1.651 m), weight 81 kg, SpO2 99 %.    Medical Problem List and Plan: 1. 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  Impaired mobility, working on transfers -Continue Lovenox             -antiplatelet therapy: Aspirin 81 mg daily and Plavix 75 mg day x3 to 6 months 3. Pain Management: Tylenol as needed 4. Anxiety: continue Xanax 0.25-0.5 mg nightly as needed             -antipsychotic agents: N/A 5. Neuropsych/cognition: This patient is capable of making decisions on her own behalf. 6. Skin/Wound Care: Routine skin checks 7. Fluids/Electrolytes/Nutrition: Routine in and outs with follow-up chemistries 8.  Left frontal large meningioma.  Follow-up neurosurgery Dr. ElEllene Route Plan for possible surgical resection 8-12 weeks.   -per NS decadron tapered to '2mg'$   q12 x 3 days then stop 9.  Hypertension.  improved, continue Norvasc 10 mg daily, increase Avapro to 150 mg daily.     -borderline/elevated--should improve with steroid taper 10.  Diabetes mellitus.  Hemoglobin A1c 7.0.   -cbg's remain poorly controlled -Increased Semglee to 35 units daily for hyperglycemia on 7/22  -increase to 38u tomorrow -  D/c ISS for hypoglycemia  CBG (last 3)  Recent Labs    11/09/21 1619 11/09/21 2047 11/10/21 0607  GLUCAP 302* 302* 140*      11.  Hyperlipidemia.  Fenofibrate/Crestor 12.  CKD stage III.  Latest creatinine 1.18 on 11/06/21.  Baseline 1.22-1.69.  Cr at baseline, monitor weekly. 13.  History of endometrial cancer.  Follow-up outpatient 14.  GERD.  continue Protonix 15. Vasovagal episode 7/18: placed teds hose on only with therapy, discussed with therapy 16. Overweight BMI 29.72: provide dietary education 17. 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 This has been discussed with son who is a retired Social research officer, government,     44. Dysphagia: will need MBS 19. Neurogenic bowel and bladder: bladder program 20. Hard of hearing: needs hearing aides, will need to ask son to bring from home.      LOS: 5 days A FACE TO FACE EVALUATION WAS PERFORMED  Meredith Staggers 11/10/2021, 9:31 AM

## 2021-11-11 LAB — GLUCOSE, CAPILLARY
Glucose-Capillary: 97 mg/dL (ref 70–99)
Glucose-Capillary: 126 mg/dL — ABNORMAL HIGH (ref 70–99)
Glucose-Capillary: 200 mg/dL — ABNORMAL HIGH (ref 70–99)
Glucose-Capillary: 253 mg/dL — ABNORMAL HIGH (ref 70–99)

## 2021-11-11 LAB — SARS CORONAVIRUS 2 BY RT PCR: SARS Coronavirus 2 by RT PCR: NEGATIVE

## 2021-11-11 NOTE — Progress Notes (Signed)
Speech Language Pathology Daily Session Note  Patient Details  Name: Cassandra Dodson MRN: [id number] Date of Birth: July 06, 1940  Today's Date: 11/11/2021 SLP Individual Time: 2563-8937 SLP Individual Time Calculation (min): 59 min  Short Term Goals: Week 1: SLP Short Term Goal 1 (Week 1): Patient will consume current diet with minimal overt s/sx of aspiration and with min A verbal cues to implement small bites/sips, single sips. SLP Short Term Goal 2 (Week 1): Patient will consume PO trials of ice chips and/or thin liquids with minimal overt s/sx of aspiration to indicate readiness for instrumental swallow evaluation. SLP Short Term Goal 3 (Week 1): Patient will orient x4 with max A for use of external aids SLP Short Term Goal 3 - Progress (Week 1): Other (comment) (revised) SLP Short Term Goal 4 (Week 1): Patient will improve intellectual awareness by identifying at least 2 changes (e.g., cognitive, speech, swallowing, physical) with max A verbal and contextual cues SLP Short Term Goal 4 - Progress (Week 1): Other (comment) (revised) SLP Short Term Goal 5 (Week 1): Patient will demonstrate functional recall by using internal/external memory strategies/aids with max A verbal/visual cues SLP Short Term Goal 5 - Progress (Week 1): Other (comment) (revised) SLP Short Term Goal 6 (Week 1): Patient will identify up to 2 uses for call bell with max A verbal/visual cues SLP Short Term Goal 6 - Progress (Week 1): Discontinued (comment) (initial goal discontinued; new goal made)  Skilled Therapeutic Interventions:  Pt was seen for skilled ST targeting cognitive and dysphagia goals.  Pt was awake, alert, and agreeable to participating in treatment during today's therapy session.  SLP facilitated the session with trials of ice chips and thin liquids to continue working towards diet progression.  Pt demonstrated intermittently wet vocal quality during trials of advanced consistencies; however, it is difficult  to determine whether this is directly related to consistencies as her voice was noted to intermittently become slightly wet outside of PO intake.  No other overt s/s of aspiration were noted with trials.  Pt was oriented to place, date (month and year), and situation with min question cues.  Pt was only able to identify vague changes despite max question cues.  Pt was left in bed with bed alarm set and call bell within reach.  Continue per current plan of care.     Pain Pain Assessment Pain Scale: 0-10 Pain Score: 0-No pain  Therapy/Group: Individual Therapy  [facility], Selinda Orion 11/11/2021, 5:00 PM

## 2021-11-11 NOTE — Progress Notes (Signed)
PROGRESS NOTE   Subjective/Complaints: No new issues. Still incontinent. Slow to arouse when I came in  ROS: Limited due to cognitive/behavioral    Objective:   No results found. No results for input(s): "WBC", "HGB", "HCT", "PLT" in the last 72 hours.  No results for input(s): "NA", "K", "CL", "CO2", "GLUCOSE", "BUN", "CREATININE", "CALCIUM" in the last 72 hours.   Intake/Output Summary (Last 24 hours) at 11/11/2021 0936 Last data filed at 11/10/2021 1900 Gross per 24 hour  Intake 110 ml  Output 270 ml  Net -160 ml     Pressure Injury 11/01/21 Sacrum Mid Stage 1 -  Intact skin with non-blanchable redness of a localized area usually over a bony prominence. (Active)  11/01/21 1750  Location: Sacrum  Location Orientation: Mid  Staging: Stage 1 -  Intact skin with non-blanchable redness of a localized area usually over a bony prominence.  Wound Description (Comments):   Present on Admission: Yes    Physical Exam: Vital Signs Blood pressure 134/60, pulse 68, temperature 97.7 F (36.5 C), resp. rate 16, height '5\' 5"'$  (1.651 m), weight 81 kg, SpO2 98 %. Constitutional: No distress . Vital signs reviewed. HEENT: NCAT, EOMI, oral membranes moist Neck: supple Cardiovascular: RRR without murmur. No JVD    Respiratory/Chest: CTA Bilaterally without wheezes or rales. Normal effort    GI/Abdomen: BS +, non-tender, non-distended Ext: no clubbing, cyanosis, or flat, delayed    Skin: mild stage 1 on sacrum Neurological:     Comments: slow to awaken and process.  Mild dysarthria.  Oriented to self and place.  Follows simple commands.   HOH. Cranial nerves II through XII intact, motor strength is 5/5 in RIght and 4- Left deltoid, bicep, tricep, grip,5/5 Right and 2- left  hip flexor, knee extensors, ankle dorsiflexor and plantar flexor Sensory exam normal sensation to light touch and proprioception in bilateral upper and lower  extremities   Assessment/Plan: 1. Functional deficits which require 3+ hours per day of interdisciplinary therapy in a comprehensive inpatient rehab setting. Physiatrist is providing close team supervision and 24 hour management of active medical problems listed below. Physiatrist and rehab team continue to assess barriers to discharge/monitor patient progress toward functional and medical goals  Care Tool:  Bathing    Body parts bathed by patient: Left arm, Chest, Face   Body parts bathed by helper: Right arm, Abdomen, Front perineal area, Buttocks, Right upper leg, Left upper leg, Right lower leg, Left lower leg     Bathing assist Assist Level: Maximal Assistance - Patient 24 - 49%     Upper Body Dressing/Undressing Upper body dressing   What is the patient wearing?: Pull over shirt    Upper body assist Assist Level: Moderate Assistance - Patient 50 - 74%    Lower Body Dressing/Undressing Lower body dressing      What is the patient wearing?: Pants, Incontinence brief     Lower body assist Assist for lower body dressing: Total Assistance - Patient < 25%     Toileting Toileting    Toileting assist Assist for toileting: Dependent - Patient 0%     Transfers Chair/bed transfer  Transfers assist  Chair/bed transfer  activity did not occur: Safety/medical concerns  Chair/bed transfer assist level: Maximal Assistance - Patient 25 - 49%     Locomotion Ambulation   Ambulation assist      Assist level: Maximal Assistance - Patient 25 - 49% Assistive device: Parallel bars Max distance: 59f   Walk 10 feet activity   Assist  Walk 10 feet activity did not occur: Safety/medical concerns        Walk 50 feet activity   Assist Walk 50 feet with 2 turns activity did not occur: Safety/medical concerns         Walk 150 feet activity   Assist Walk 150 feet activity did not occur: Safety/medical concerns         Walk 10 feet on uneven surface   activity   Assist Walk 10 feet on uneven surfaces activity did not occur: Safety/medical concerns         Wheelchair     Assist Is the patient using a wheelchair?: Yes Type of Wheelchair: Manual    Wheelchair assist level: Dependent - Patient 0% Max wheelchair distance: 017f   Wheelchair 50 feet with 2 turns activity    Assist        Assist Level: Dependent - Patient 0%   Wheelchair 150 feet activity     Assist      Assist Level: Dependent - Patient 0%   Blood pressure 134/60, pulse 68, temperature 97.7 F (36.5 C), resp. rate 16, height '5\' 5"'$  (1.651 m), weight 81 kg, SpO2 98 %.    Medical Problem List and Plan: 1. 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  Impaired mobility, working on transfers -Continue Lovenox             -antiplatelet therapy: Aspirin 81 mg daily and Plavix 75 mg day x3 to 6 months 3. Pain Management: Tylenol as needed 4. Anxiety: continue Xanax 0.25-0.5 mg nightly as needed             -antipsychotic agents: N/A 5. Neuropsych/cognition: This patient is capable of making decisions on her own behalf. 6. Skin/Wound Care: Routine skin checks 7. Fluids/Electrolytes/Nutrition: Routine in and outs with follow-up chemistries 8.  Left frontal large meningioma.  Follow-up neurosurgery Dr. ElEllene Route Plan for possible surgical resection 8-12 weeks.   -per NS decadron tapered to '2mg'$  q12 x 3 days, completes 7/23 9.  Hypertension.  improved, continue Norvasc 10 mg daily, increase Avapro to 150 mg daily.     -improving with steroid taper 10.  Diabetes mellitus.  Hemoglobin A1c 7.0.   -cbg's improving -Increased Semglee to 38 units daily  for 7/23 -  D/c ISS for hypoglycemia -may need further adjustment after steroids dc  CBG (last 3)  Recent Labs    11/10/21 1704 11/10/21 2054 11/11/21 0555  GLUCAP 193* 138* 97      11.  Hyperlipidemia.  Fenofibrate/Crestor 12.  CKD stage III.  Latest creatinine 1.18 on 11/06/21.  Baseline 1.22-1.69.  Cr at baseline, recheck 7/24 13.  History of endometrial cancer.  Follow-up outpatient 14.  GERD.  continue Protonix 15. Vasovagal episode 7/18: placed  teds hose on only with therapy, discussed with therapy 16. Overweight BMI 29.72: provide dietary education 17. 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 This has been discussed with son who is a retired Social research officer, government,     28. Dysphagia: will need MBS 19. Neurogenic bowel and bladder: bladder program 20. Hard of hearing: needs hearing aides, will need to ask son to bring from home.      LOS: 6 days A FACE TO FACE EVALUATION WAS PERFORMED  Meredith Staggers 11/11/2021, 9:36 AM

## 2021-11-11 NOTE — Progress Notes (Signed)
Physical Therapy Session Note  Patient Details  Name: Cassandra Dodson MRN: [id number] Date of Birth: [birth year]  Today's Date: 11/11/2021 PT Individual Time: 0730-0814 PT Individual Time Calculation (min): 44 min   Short Term Goals: Week 1:  PT Short Term Goal 1 (Week 1): Pt will complete bed mobility with modA PT Short Term Goal 2 (Week 1): Pt will complete bed<>chair transfers with modA and LRAD PT Short Term Goal 3 (Week 1): Pt will ambulate 72f with modA and LRAD PT Short Term Goal 4 (Week 1): Pt will participate in functional outcome measure to assess falls risk  Skilled Therapeutic Interventions/Progress Updates:      NT feeding patient in bed to start. Handoff of care and pt agreeable to therapy. Noted pt to be incontinent of bladder with soiled brief and linen. Dependant assist for brief change and pericare. She can roll to her R with modA using bed rails and she requires maxA with bed rails for rolling to her L. Dependant assist for donning disposable pants in similar manner. Pt with eyes closed for majority of this part of session and lacks participation despite instruction.   Supine<>sitting EOB with totalA for trunk support and BLE management. Requires minA for sitting unsupported at EOB due to L lean. Stedy transfer for safety with minA to [place] in [place] in w/c to main rehab gym for time management.  Wheeled inside // bars to continue working on sit<>stands, standing tolerance, postural awareness. She can complete sit<>Stand in // bars while pulling herself up to stand with as little as minA with L knee blocked. IN standing, requires minA for standing balance but has hips/trunk rotated to her L with L knee hyperextended in stance. Facilitation in standing for neutral alignment and postural control - pt lacks awareness of deficits and delayed initiation for any multi modal cue for correcting. Standing tolerance ~45seconds for each stand, completing 5 reps.   Returned to  her room in w/c and Stedy transfer for safety to recliner. Safety belt alarm on, BLE elevated, pillows provided for her back to improve slumped posture. All needs met with call bell in lap.    Therapy Documentation Precautions:  Precautions Precautions: Fall Precaution Comments: L hemi, incontinent Restrictions Weight Bearing Restrictions: No General:    Therapy/Group: Individual Therapy  [name]7/23/2023, 6:52 AM

## 2021-11-11 NOTE — Progress Notes (Signed)
Occupational Therapy Session Note  Patient Details  Name: Cassandra Dodson MRN: [id number] Date of Birth: 1940/10/23  Today's Date: 11/11/2021 OT Individual Time: 3536-1443 OT Individual Time Calculation (min): 45 min    Short Term Goals: Week 1:  OT Short Term Goal 1 (Week 1): Patient will identify need to void and have at least 1 continent episode OT Short Term Goal 2 (Week 1): Patient will transfer to [facility] with mod assist OT Short Term Goal 3 (Week 1): Patient will dress upper body with min assist (excluding bra) OT Short Term Goal 4 (Week 1): Patient will dress lower body with max assist OT Short Term Goal 5 (Week 1): Patient will bathe upper body with min assist following set up Week 2:     Skilled Therapeutic Interventions/Progress Updates:     The patient was up in the recliner upon arrival, the pt indcated that she was not aware how she felt, if she was having pain or whether she slept well last night,   nursing indicated that this was very common in relation to her responses, presenting  as very confused and HOH.  Nursing provided the pt with her daily medication regimen and I inquired of the pt if she would be willing to working on a BADL related task to improve her functional outcome, the pt stated that she would try. The pt was able to wash her UB inclusive of her face, arm, chest and midriff with heavy vc's and Min/Mod assist secondary to challenges with activity tolerance and attentiveness.  The pt was MaxA for LB task performance of doffing LB garments inclusive of a  brief and pants and washing her LB.  The pt was able to come from sit to stand using the stedy with ModA and constatnt vc's for maintaining good anatomical positioning.  The pt remained in the recliner with her bedside table and call light within reach.  The alarm was activated and all additional needs of the pt were addressed.  Therapy Documentation Precautions:  Precautions Precautions: Fall Precaution Comments: L  hemi, incontinent Restrictions Weight Bearing Restrictions: No   Therapy/Group: Individual Therapy  Yvonne Kendall 11/11/2021, 4:17 PM

## 2021-11-12 LAB — BASIC METABOLIC PANEL
Anion gap: 5 (ref 5–15)
BUN: 50 mg/dL — ABNORMAL HIGH (ref 8–23)
CO2: 24 mmol/L (ref 22–32)
Calcium: 8.7 mg/dL — ABNORMAL LOW (ref 8.9–10.3)
Chloride: 105 mmol/L (ref 98–111)
Creatinine, Ser: 1.42 mg/dL — ABNORMAL HIGH (ref 0.44–1.00)
GFR, Estimated: 37 mL/min — ABNORMAL LOW (ref >60)
Glucose, Bld: 105 mg/dL — ABNORMAL HIGH (ref 70–99)
Potassium: 4.4 mmol/L (ref 3.5–5.1)
Sodium: 134 mmol/L — ABNORMAL LOW (ref 135–145)

## 2021-11-12 LAB — CBC
HCT: 35.4 % — ABNORMAL LOW (ref 36.0–46.0)
Hemoglobin: 12.1 g/dL (ref 12.0–15.0)
MCH: 30.7 pg (ref 26.0–34.0)
MCHC: 34.2 g/dL (ref 30.0–36.0)
MCV: 89.8 fL (ref 80.0–100.0)
Platelets: 176 10*3/uL (ref 150–400)
RBC: 3.94 MIL/uL (ref 3.87–5.11)
RDW: 14.6 % (ref 11.5–15.5)
WBC: 7.9 10*3/uL (ref 4.0–10.5)
nRBC: 0 % (ref 0.0–0.2)

## 2021-11-12 LAB — GLUCOSE, CAPILLARY
Glucose-Capillary: 107 mg/dL — ABNORMAL HIGH (ref 70–99)
Glucose-Capillary: 157 mg/dL — ABNORMAL HIGH (ref 70–99)
Glucose-Capillary: 187 mg/dL — ABNORMAL HIGH (ref 70–99)
Glucose-Capillary: 200 mg/dL — ABNORMAL HIGH (ref 70–99)

## 2021-11-12 MED ORDER — IRBESARTAN 300 MG PO TABS: 300.0000 mg | ORAL_TABLET | Freq: Every day | ORAL | Status: DC

## 2021-11-12 MED ORDER — HYDRALAZINE HCL 10 MG PO TABS
10.0000 mg | ORAL_TABLET | Freq: Three times a day (TID) | ORAL | Status: DC
  Administered 2021-11-12 – 2021-11-14 (×8): 10 mg via ORAL
  Filled 2021-11-12 (×8): qty 1

## 2021-11-12 MED ORDER — INSULIN GLARGINE-YFGN 100 UNIT/ML ~~LOC~~ SOLN
36.0000 [IU] | Freq: Every day | SUBCUTANEOUS | Status: DC
Start: 2021-11-12 — End: 2021-11-15
  Administered 2021-11-12 – 2021-11-14 (×3): 36 [IU] via SUBCUTANEOUS
  Filled 2021-11-12 (×6): qty 0.36

## 2021-11-12 NOTE — Progress Notes (Signed)
PROGRESS NOTE   Subjective/Complaints: Sitting up eating breakfast with assistance from aide Has no new complaints Discussed increasing insulin for better CBG control  ROS: Limited due to cognitive/behavioral    Objective:   No results found. Recent Labs    11/12/21 0614  WBC 7.9  HGB 12.1  HCT 35.4*  PLT 176    Recent Labs    11/12/21 0614  NA 134*  K 4.4  CL 105  CO2 24  GLUCOSE 105*  BUN 50*  CREATININE 1.42*  CALCIUM 8.7*     Intake/Output Summary (Last 24 hours) at 11/12/2021 0908 Last data filed at 11/11/2021 1805 Gross per 24 hour  Intake 610 ml  Output --  Net 610 ml     Pressure Injury 11/01/21 Sacrum Mid Stage 1 -  Intact skin with non-blanchable redness of a localized area usually over a bony prominence. (Active)  11/01/21 1750  Location: Sacrum  Location Orientation: Mid  Staging: Stage 1 -  Intact skin with non-blanchable redness of a localized area usually over a bony prominence.  Wound Description (Comments):   Present on Admission: Yes    Physical Exam: Vital Signs Blood pressure (!) 150/69, pulse 64, temperature 97.9 F (36.6 C), temperature source Oral, resp. rate 16, height '5\' 5"'$  (1.651 m), weight 81 kg, SpO2 99 %. Gen: no distress, normal appearing HEENT: oral mucosa pink and moist, NCAT Cardio: Reg rate Chest: normal effort, normal rate of breathing Abd: soft, non-distended Ext: no edema Psych: pleasant, normal affect  Skin: mild stage 1 on sacrum Neurological:     Comments: slow to awaken and process.  Mild dysarthria.  Oriented to self and place.  Follows simple commands.   HOH. Cranial nerves II through XII intact, motor strength is 5/5 in RIght and 4- Left deltoid, bicep, tricep, grip,5/5 Right and 2- left  hip flexor, knee extensors, ankle dorsiflexor and plantar flexor Sensory exam normal sensation to light touch and proprioception in bilateral upper and lower  extremities   Assessment/Plan: 1. Functional deficits which require 3+ hours per day of interdisciplinary therapy in a comprehensive inpatient rehab setting. Physiatrist is providing close team supervision and 24 hour management of active medical problems listed below. Physiatrist and rehab team continue to assess barriers to discharge/monitor patient progress toward functional and medical goals  Care Tool:  Bathing    Body parts bathed by patient: Left arm, Chest, Face   Body parts bathed by helper: Right arm, Abdomen, Front perineal area, Buttocks, Right upper leg, Left upper leg, Right lower leg, Left lower leg     Bathing assist Assist Level: Maximal Assistance - Patient 24 - 49%     Upper Body Dressing/Undressing Upper body dressing   What is the patient wearing?: Pull over shirt    Upper body assist Assist Level: Moderate Assistance - Patient 50 - 74%    Lower Body Dressing/Undressing Lower body dressing      What is the patient wearing?: Pants, Incontinence brief     Lower body assist Assist for lower body dressing: Total Assistance - Patient < 25%     Toileting Toileting    Toileting assist Assist for toileting: Dependent -  Patient 0%     Transfers Chair/bed transfer  Transfers assist  Chair/bed transfer activity did not occur: Safety/medical concerns  Chair/bed transfer assist level: Maximal Assistance - Patient 25 - 49%     Locomotion Ambulation   Ambulation assist      Assist level: Maximal Assistance - Patient 25 - 49% Assistive device: Parallel bars Max distance: 42f   Walk 10 feet activity   Assist  Walk 10 feet activity did not occur: Safety/medical concerns        Walk 50 feet activity   Assist Walk 50 feet with 2 turns activity did not occur: Safety/medical concerns         Walk 150 feet activity   Assist Walk 150 feet activity did not occur: Safety/medical concerns         Walk 10 feet on uneven surface   activity   Assist Walk 10 feet on uneven surfaces activity did not occur: Safety/medical concerns         Wheelchair     Assist Is the patient using a wheelchair?: Yes Type of Wheelchair: Manual    Wheelchair assist level: Dependent - Patient 0% Max wheelchair distance: 055f   Wheelchair 50 feet with 2 turns activity    Assist        Assist Level: Dependent - Patient 0%   Wheelchair 150 feet activity     Assist      Assist Level: Dependent - Patient 0%   Blood pressure (!) 150/69, pulse 64, temperature 97.9 F (36.6 C), temperature source Oral, resp. rate 16, height '5\' 5"'$  (1.651 m), weight 81 kg, SpO2 99 %.    Medical Problem List and Plan: 1. 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  Impaired mobility, working on transfers -Continue Lovenox             -antiplatelet therapy: Aspirin 81 mg daily and Plavix 75 mg day x3 to 6 months 3. Pain Management: Tylenol as needed 4. Anxiety: continue Xanax 0.25-0.5 mg nightly as needed             -antipsychotic agents: N/A 5. Neuropsych/cognition: This patient is capable of making decisions on her own behalf. 6. Skin/Wound Care: Routine skin checks 7. Fluids/Electrolytes/Nutrition: Routine in and outs with follow-up chemistries 8.  Left frontal large meningioma.  Follow-up neurosurgery Dr. ElEllene Route Plan for possible surgical resection 8-12 weeks.   -per NS decadron tapered to '2mg'$  q12 x 3 days, completes 7/23 9.  Hypertension.  improved, continue Norvasc 10 mg daily, increase Avapro to 300 mg daily.     -improving with steroid taper 10.  Diabetes mellitus.  Hemoglobin A1c 7.0.   -cbg's improving -Increase  Semglee to 36 units daily for 7/23 -  Restarted ISS -may need further adjustment after steroids dc  CBG (last 3)  Recent Labs    11/11/21 1615 11/11/21 2134 11/12/21 0540  GLUCAP 200* 253* 107*      11.  Hyperlipidemia.  Fenofibrate/Crestor 12.  CKD stage III.  Latest creatinine 1.4 on 11/12/21.  Baseline 1.22-1.69.  Monitor weekly 13.  History of endometrial cancer.  Follow-up outpatient 14.  GERD.  continue Protonix 15. Vasovagal episode 7/18: placed teds hose on only with therapy, discussed with therapy 16. Overweight BMI 29.72: provide dietary education 17. 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 This has been discussed with son who is a retired Social research officer, government,     73. Dysphagia: will need MBS 19. Neurogenic bowel and bladder: bladder program 20. Hard of hearing: needs hearing aides, will need to ask son to bring from home.      LOS: 7 days A FACE TO FACE EVALUATION WAS PERFORMED  Taaliyah Delpriore P Tysheem Accardo 11/12/2021, 9:08 AM

## 2021-11-12 NOTE — Progress Notes (Signed)
Occupational Therapy Session Note  Patient Details  Name: Cassandra Dodson MRN: [id number] Date of Birth: 1940-09-08  Today's Date: 11/12/2021 OT Individual Time: 0920-1025 OT Individual Time Calculation (min): 65 min    Short Term Goals: Week 1:  OT Short Term Goal 1 (Week 1): Patient will identify need to void and have at least 1 continent episode OT Short Term Goal 2 (Week 1): Patient will transfer to [facility] with mod assist OT Short Term Goal 3 (Week 1): Patient will dress upper body with min assist (excluding bra) OT Short Term Goal 4 (Week 1): Patient will dress lower body with max assist OT Short Term Goal 5 (Week 1): Patient will bathe upper body with min assist following set up  Skilled Therapeutic Interventions/Progress Updates:    Patient received sitting up in wheelchair, awake, with TV on.  Patient visually inattentive to left side of room / body - but with overt cueing will visually attend briefly to left side.  Patient washed face, cleaned teeth, combed hair with mod cueing and set up.  Patient still awake and responsive to questions.  Attempted toilet transfer using grab bar.  Patient able to stand step transfer to commode with mod assist!  Patient unable to help manage clothing.  No void on toilet, and patient became less responsive.  Assisted patient with [name] back to wheelchair.  Patient then incontinent of watery stool.  Patient transferred back to bed and changed, brief, cleaned patient.  Patient minimally responsive - but would assist with rolling left and right.   Patient left in bed with bed alarm set, and call bell within reach.    Vitals:  BP 148/65 Supine  Therapy Documentation Precautions:  Precautions Precautions: Fall Precaution Comments: L hemi, incontinent Restrictions Weight Bearing Restrictions: No  Pain: Pain Assessment Pain Scale: 0-10 Pain Score: 0-No pain     Therapy/Group: Individual Therapy  Mariah Milling 11/12/2021, 11:43 AM

## 2021-11-12 NOTE — Progress Notes (Signed)
Physical Therapy Session Note  Patient Details  Name: Cassandra Dodson MRN: [id number] Date of Birth: 23-Jun-1940  Today's Date: 11/12/2021 PT Missed Time: 45 Minutes Missed Time Reason: Patient fatigue;Patient unwilling to participate      Pt refusing therapy despite encouragement. Quickly falling asleep when instructed on mobiltiy. Unable to participate. She missed 45 minutes of therapy.     Lyric Rossano P Pamula Luther PT 11/12/2021, 7:32 AM

## 2021-11-12 NOTE — Progress Notes (Addendum)
Speech Language Pathology Daily Session Note  Patient Details  Name: Cassandra Dodson MRN: [id number] Date of Birth: 1940-05-12  Today's Date: 11/12/2021 SLP Individual Time: 7169-6789 SLP Individual Time Calculation (min): 60 min  Short Term Goals: Week 1: SLP Short Term Goal 1 (Week 1): Patient will consume current diet with minimal overt s/sx of aspiration and with min A verbal cues to implement small bites/sips, single sips. - Skilled observation with current diet consistencies (Dysphagia 3 + NTL) completed. Pt self-administered current diet consistencies with minimal overt s/sx concerning for aspiration and airway compromise when provided with Max A multimodal cues to implement small + single bites/sips and slow her rate of intake.   SLP Short Term Goal 2 (Week 1): Patient will consume PO trials of ice chips and/or thin liquids with minimal overt s/sx of aspiration to indicate readiness for instrumental swallow evaluation. - Given Total A for oral care and administration of trials, pt consumed therapeutic PO trials of ice chips and 1/2 tsp thin water with minimal overt s/sx concerning for aspiration or airway invasion; no cough noted with only one instance of wet vocal quality with water. Clear with cued cough/throat clear and secondary swallow.   SLP Short Term Goal 3 (Week 1): Patient will orient x4 with max A for use of external aids. - Pt oriented x 4 given initial Max A faded to Mod-Max A multimodal prompts for use of external aid (e.g. whiteboard).  SLP Short Term Goal 4 (Week 1): Patient will improve intellectual awareness by identifying at least 2 changes (e.g., cognitive, speech, swallowing, physical) with max A verbal and contextual cues. - Total A to re-educate pt re: reason for admission to CIR, as well as rationale for aspiration precautions. Pt unable to demonstrate participation in teach back to demonstrate understanding.   SLP Short Term Goal 5 (Week 1): Patient will demonstrate  functional recall by using internal/external memory strategies/aids with max A verbal/visual cues. - Pt demonstrated functional recall via implementation of aspiration precautions (small + single bites/sips and slow rate) given Max A faded to Mod-Max A multimodal cues with use of mass practice and errorless learning principles. Benefited from direct language (e.g. 1, 2 bites and then "put the fork down to chew." Closed the lid to the food container as well).  SLP Short Term Goal 6 (Week 1): Patient will identify up to 2 uses for call bell with max A verbal/visual cues. - Did not formally address this session.  Skilled Therapeutic Interventions: S: Pt seen this date for skilled ST intervention targeting deglutition and cognitive-linguistic goals outlined above. Pt received sleeping in bed; slow to arouse with wet vocal quality upon phonation; unable to fully clear with Max A cues for produce volitional throat clear + cough. Benefited from environmental modifications to include repositioning HOB, turning lights on, and removing distractions (e.g. TV). Additionally, provided Total A for oral care via suction toothbrush to aid in arousal and to prepare for therapeutic trials of ice chips and thin water. Flat affect, L inattention, and decreased awareness noted throughout. Transferred from bed to w/c with Max A + 2 to optimize positioning for meal consumption. Agreeable to ST intervention at bedside. NT reports observation of pt coughing with PO intake over the weekend.  O: Please see above for objective data re: pt's performance on targeted goals. SLP provided skilled education re: reason for CIR admission, current ST POC, re-education re: aspiration precautions and importance of implementing during PO intake, current diet consistencies and rationale  for such, and re-orientation via use of external aid (e.g. whiteboard). Overall, pt's poor intellectual awareness, safety awareness, initiation, inattention, motor  planning/sequencing, and impulsivity negatively impact her performance across functional cognitive-communication tasks, as well as safety for PO intake; therefore continue to recommend full supervision with all PO intake, in addition to 24/7 supervision/assistance at d/c.   A: Pt appears minimally stimulable for skilled ST interventions in the setting of acute R pontine CVA. Pt demonstrated temporary stimulability for mass practice, corrective feedback, and Max A multimodal cueing to slow her eating rate, for safety, during meal consumption and Max A for use of external aids and errorless learning to promote orientation. Will continue to monitor readiness for MBSS; less s/sx concerning for aspiration with controlled PO trials of ice chips and 1/2 tsp sips of water this date in comparison to previous sessions.  P: Pt left upright in w/c with direct hand off to NT for completion of AM meal. All safety measures activated and call bell reviewed and within reach. Continue per current ST POC next session.   Pain None/Denies  Therapy/Group: Individual Therapy  Daria Mcmeekin A Kylinn Shropshire 11/12/2021, 11:13 AM

## 2021-11-13 LAB — GLUCOSE, CAPILLARY
Glucose-Capillary: 68 mg/dL — ABNORMAL LOW (ref 70–99)
Glucose-Capillary: 71 mg/dL (ref 70–99)
Glucose-Capillary: 77 mg/dL (ref 70–99)
Glucose-Capillary: 186 mg/dL — ABNORMAL HIGH (ref 70–99)
Glucose-Capillary: 221 mg/dL — ABNORMAL HIGH (ref 70–99)
Glucose-Capillary: 250 mg/dL — ABNORMAL HIGH (ref 70–99)

## 2021-11-13 NOTE — Progress Notes (Signed)
Occupational Therapy Weekly Progress Note  Patient Details  Name: Cassandra Dodson MRN: [id number] Date of Birth: 12/28/1940  Beginning of progress report period: November 06, 2021 End of progress report period: November 13, 2021  Today's Date: 11/13/2021 OT Individual Time: 7741-2878 OT Individual Time Calculation (min): 42 min    Patient has met 0 of 5 short term goals.  Patient is starting to show some improved ability to focus attention for up to one minute at a time in conversational context.  Patient has more pronounced difficulty with attention in context of motor task.  Patient with significant medical complications - team working to monitor BP - initially orthostatic, more often now running elevated BP.  Patient with frequent loose stool and little to no awareness.   Patient with limited ability to safely be upright for more than brief moments.  Patient is showing improved visual attention to left environment.    Patient continues to demonstrate the following deficits: decreased cardiorespiratoy endurance, abnormal tone, unbalanced muscle activation, decreased coordination, and decreased motor planning, decreased visual perceptual skills, decreased visual motor skills, and field cut, decreased midline orientation, decreased attention to left, left side neglect, and decreased motor planning, decreased initiation, decreased attention, decreased awareness, decreased problem solving, decreased safety awareness, decreased memory, and delayed processing, and decreased sitting balance, decreased standing balance, decreased postural control, hemiplegia, and decreased balance strategies and therefore will continue to benefit from skilled OT intervention to enhance overall performance with BADL.  Patient not progressing toward long term goals.  See goal revision..  Plan of care revisions: Patient not tolerating 3 hours of therapy daily - shifted to 15 hours over 7 days.  OT Short Term Goals Week 1:  OT Short  Term Goal 1 (Week 1): Patient will identify need to void and have at least 1 continent episode OT Short Term Goal 1 - Progress (Week 1): Not met OT Short Term Goal 2 (Week 1): Patient will transfer to [facility] with mod assist OT Short Term Goal 2 - Progress (Week 1): Not met OT Short Term Goal 3 (Week 1): Patient will dress upper body with min assist (excluding bra) OT Short Term Goal 3 - Progress (Week 1): Not met OT Short Term Goal 4 (Week 1): Patient will dress lower body with max assist OT Short Term Goal 4 - Progress (Week 1): Not met OT Short Term Goal 5 (Week 1): Patient will bathe upper body with min assist following set up OT Short Term Goal 5 - Progress (Week 1): Not met Week 2:  OT Short Term Goal 1 (Week 2): Patient will bathe upper body with min assist following set up OT Short Term Goal 2 (Week 2): Patient will don pull over shirt with min assist OT Short Term Goal 3 (Week 2): Patient will trasnfer to/from commode with mod assist OT Short Term Goal 4 (Week 2): Patient will pull pants up/down over hips with mod assist OT Short Term Goal 5 (Week 2): Patient will visualy scan to left to locate needed hygiene or food items  Skilled Therapeutic Interventions/Progress Updates:    Patient received supine in bed sleeping.  Patient alerted to her name and turned to left.  Patient initially stating "no" when asked if she was ready to get up from bed.  However, with explanation of why it might help to get up - she agreed.  Worked on bed mobility - rolling toward left, and bringing feet over edge of bed.  Patient needed assistance for  LLE.  Transitioning from sidesit to sitting with mod assist - but patient stayed focused on task.   Postural control and sitting balance - working to improve thoracic extension, pelvis tilt anteriorly.  Patient can achieve, but cannot sustain.  Sit to stand with mod assist x multiple reps.  Patient able to stand x 5 seconds at a time.  Much improvement noted posturally  this session!   Patient becoming fatigued so assisted to seated position with bed support - to work on visual/ cognitive task - sorting cards.  Patient asked to sort cards from high to low (field of 5) Patient sorted cards by suit - with only 2 errors.  When prompted patient to sort from high to low - and demo issued - patient able to sort with moderate cueing.  Patient attempted spontaneous use of LUE throughout this task, however, did not recognize less than optimal attempts.   Patient returned to supine, with adjustments to pillow to bring her head and body more midline.   Bed alarm set, and call bell within reach.    Therapy Documentation Precautions:  Precautions Precautions: Fall Precaution Comments: L hemi, incontinent Restrictions Weight Bearing Restrictions: No  Therapy Vitals Temp: 98.6 F (37 C) Temp Source: Oral Pulse Rate: 85 Resp: 16 BP: 140/80 Patient Position (if appropriate): Lying Oxygen Therapy SpO2: 92 % O2 Device: Room Air Pain: No report of pain   Therapy/Group: Individual Therapy  Mariah Milling 11/13/2021, 3:08 PM

## 2021-11-13 NOTE — Progress Notes (Signed)
Physical Therapy Weekly Progress Note  Patient Details  Name: Cassandra Dodson MRN: [id number] Date of Birth: May 06, 1940  Beginning of progress report period: November 06, 2021 End of progress report period: November 13, 2021  Today's Date: 11/13/2021 PT Individual Time: 0900-1000 PT Individual Time Calculation (min): 60 min   Patient has met 0 of 4 short term goals.  Pt making very slow progress this reporting period. Overall, she requires max/totalA for bed mobility, minA for unsupported sitting balance, maxA for sit<>stand transfers, mod to maxA for squat<>pivot transfers, and minA for transfers using the Denna Haggard. She is primarily limited by significant cognitive deficits, apathy towards therapy, generalized weakness and deconditioning, L sided weakness, delayed processing and initiation, visual deficits, language and communication deficits, and poor insight into her deficits. She remains incontinent of B&B as well, which has hindered therapy sessions. Due to decreased tolerance with therapy with several missed minutes from refusal and fatigue, her frequency has been decreased to 15/7. She may likely require SNF placement at [place] unless son commits to 24/7 care.   Patient continues to demonstrate the following deficits muscle weakness and muscle joint tightness, decreased cardiorespiratoy endurance, abnormal tone, unbalanced muscle activation, motor apraxia, decreased coordination, and decreased motor planning, decreased visual acuity, decreased visual perceptual skills, and decreased visual motor skills, decreased midline orientation, decreased attention to left, and decreased motor planning, decreased initiation, decreased attention, decreased awareness, decreased problem solving, decreased safety awareness, decreased memory, and delayed processing, and decreased sitting balance, decreased standing balance, decreased postural control, hemiplegia, and decreased balance strategies and therefore will continue to  benefit from skilled PT intervention to increase functional independence with mobility.  Patient not progressing toward long term goals.  See goal revision..  Plan of care revisions: See LTG POC note for details. Goals Downgraded.  PT Short Term Goals Week 2:  PT Short Term Goal 1 (Week 2): Pt will complete bed mobility with modA PT Short Term Goal 2 (Week 2): Pt will complete bed<>chair transfers with modA PT Short Term Goal 3 (Week 2): Pt will propel wheelchair 45f with maxA PT Short Term Goal 4 (Week 2): Pt will demonstrate improved activity tolerance by completing daily therapy sessions  Skilled Therapeutic Interventions/Progress Updates:      Pt presenting sitting in w/c, alert and agreeable to therapy. Denies pain. Noted to be slumping forward to edge of wheelchair with concern for sliding out. Assisted for repositioning with maxA with some improved initiation for reciprocal scooting backwards.   Transported in w/c to main rehab gym for time and energy conservation. BP assessed, reading 122/59 while seated in w/c.   Setup in standing frame to work on standing tolerance, weight bearing through LE's, postural awareness. Instructed pt on assisting for transitions during sit<>stand but limited initiation or effort noted. She was able to stand max 2 minutes per stand while standing in frame. Repetitive cues for postural awareness due to flexed posturing, able to correct and respond well to verbal cueing but difficulty maintaining posture. Atttempted to integrate therapeutic activity to distract in standing using push pins to matching color/image. Pt unable to adequatly use [facility] and also was significantly internally and externally distracted. Continued to have standing tolerance of max 2 minutes with forward flexed posturing.   Pt returned to her room in w/c. Used Stedy to safely transfer patient back to bed (stedy used due to fatigue). Pt requiring modA to rise to  stand in STennysonand can  sit in perched position with CGA. Required maxA for sit>supine for trunk support and BLE management. She was repositioned in bed for comfort. Call bell in lap, bed alarm on. Bed semi-reclined. All needs met.   Therapy Documentation Precautions:  Precautions Precautions: Fall Precaution Comments: L hemi, incontinent Restrictions Weight Bearing Restrictions: No General:    Therapy/Group: Individual Therapy  Geniene List P Kamyra Schroeck 11/13/2021, 7:45 AM

## 2021-11-13 NOTE — Progress Notes (Signed)
PROGRESS NOTE   Subjective/Complaints: Sitting up eating almost 100% of breakfast. Discussed with her that her CBGs are 68-71, stopping SSI during daytime.  She has no other complaints  ROS: Limited due to cognitive/behavioral    Objective:   No results found. Recent Labs    11/12/21 0614  WBC 7.9  HGB 12.1  HCT 35.4*  PLT 176    Recent Labs    11/12/21 0614  NA 134*  K 4.4  CL 105  CO2 24  GLUCOSE 105*  BUN 50*  CREATININE 1.42*  CALCIUM 8.7*     Intake/Output Summary (Last 24 hours) at 11/13/2021 0915 Last data filed at 11/13/2021 0600 Gross per 24 hour  Intake 480 ml  Output --  Net 480 ml     Pressure Injury 11/01/21 Sacrum Mid Stage 1 -  Intact skin with non-blanchable redness of a localized area usually over a bony prominence. (Active)  11/01/21 1750  Location: Sacrum  Location Orientation: Mid  Staging: Stage 1 -  Intact skin with non-blanchable redness of a localized area usually over a bony prominence.  Wound Description (Comments):   Present on Admission: Yes    Physical Exam: Vital Signs Blood pressure (!) 141/70, pulse 82, temperature 98.2 F (36.8 C), temperature source Oral, resp. rate 18, height '5\' 5"'$  (1.651 m), weight 81 kg, SpO2 99 %. Gen: no distress, normal appearing, eating almost 100% of meals HEENT: oral mucosa pink and moist, NCAT Cardio: Reg rate Chest: normal effort, normal rate of breathing Abd: soft, non-distended Ext: no edema Psych: pleasant, normal affect  Skin: mild stage 1 on sacrum Neurological:     Comments: slow to awaken and process.  Mild dysarthria.  Oriented to self and place.  Follows simple commands.   HOH. Cranial nerves II through XII intact, motor strength is 5/5 in RIght and 4- Left deltoid, bicep, tricep, grip,5/5 Right and 2- left  hip flexor, knee extensors, ankle dorsiflexor and plantar flexor Sensory exam normal sensation to light touch and  proprioception in bilateral upper and lower extremities   Assessment/Plan: 1. Functional deficits which require 3+ hours per day of interdisciplinary therapy in a comprehensive inpatient rehab setting. Physiatrist is providing close team supervision and 24 hour management of active medical problems listed below. Physiatrist and rehab team continue to assess barriers to discharge/monitor patient progress toward functional and medical goals  Care Tool:  Bathing    Body parts bathed by patient: Left arm, Face   Body parts bathed by helper: Right arm, Abdomen, Front perineal area, Buttocks, Right upper leg, Left upper leg, Right lower leg, Left lower leg     Bathing assist Assist Level: Maximal Assistance - Patient 24 - 49%     Upper Body Dressing/Undressing Upper body dressing   What is the patient wearing?: Pull over shirt    Upper body assist Assist Level: Moderate Assistance - Patient 50 - 74%    Lower Body Dressing/Undressing Lower body dressing      What is the patient wearing?: Pants, Incontinence brief     Lower body assist Assist for lower body dressing: Total Assistance - Patient < 25%     Toileting  Toileting    Toileting assist Assist for toileting: Dependent - Patient 0%     Transfers Chair/bed transfer  Transfers assist  Chair/bed transfer activity did not occur: Safety/medical concerns  Chair/bed transfer assist level: Total Assistance - Patient < 25%     Locomotion Ambulation   Ambulation assist      Assist level: Maximal Assistance - Patient 25 - 49% Assistive device: Parallel bars Max distance: 84f   Walk 10 feet activity   Assist  Walk 10 feet activity did not occur: Safety/medical concerns        Walk 50 feet activity   Assist Walk 50 feet with 2 turns activity did not occur: Safety/medical concerns         Walk 150 feet activity   Assist Walk 150 feet activity did not occur: Safety/medical concerns         Walk  10 feet on uneven surface  activity   Assist Walk 10 feet on uneven surfaces activity did not occur: Safety/medical concerns         Wheelchair     Assist Is the patient using a wheelchair?: Yes Type of Wheelchair: Manual    Wheelchair assist level: Dependent - Patient 0% Max wheelchair distance: 017f   Wheelchair 50 feet with 2 turns activity    Assist        Assist Level: Dependent - Patient 0%   Wheelchair 150 feet activity     Assist      Assist Level: Dependent - Patient 0%   Blood pressure (!) 141/70, pulse 82, temperature 98.2 F (36.8 C), temperature source Oral, resp. rate 18, height '5\' 5"'$  (1.651 m), weight 81 kg, SpO2 99 %.    Medical Problem List and Plan: 1. 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  Impaired mobility, working on transfers -Continue Lovenox             -antiplatelet therapy: Aspirin 81 mg daily and Plavix 75 mg day x3 to 6 months 3. Pain Management: Tylenol as needed 4. Anxiety: continue Xanax 0.25-0.5 mg nightly as needed             -antipsychotic agents: N/A 5. Neuropsych/cognition: This patient is capable of making decisions on her own behalf. 6. Skin/Wound Care: Routine skin checks 7. Fluids/Electrolytes/Nutrition: Routine in and outs with follow-up chemistries 8.  Left frontal large meningioma.  Follow-up neurosurgery Dr. ElEllene Route Plan for possible surgical resection 8-12 weeks.   -per NS decadron tapered to '2mg'$  q12 x 3 days, completes 7/23 9.  Hypertension.  improved, continue Norvasc 10 mg daily, maintain avapro, increase hydralazine to '10mg'$  TID  -improving with steroid taper 10.  Diabetes mellitus.  Hemoglobin  A1c 7.0.   -cbg's improving -Increase Semglee to 36 units daily for 7/23 -  d/c daytime ISS -may need further adjustment after steroids dc  CBG (last 3)  Recent Labs    11/13/21 0548 11/13/21 0618 11/13/21 0642  GLUCAP 71 68* 77      11.  Hyperlipidemia.  Fenofibrate/Crestor 12.  CKD stage III.  Latest creatinine 1.4 on 11/12/21.  Baseline 1.22-1.69.  Monitor weekly 13.  History of endometrial cancer.  Follow-up outpatient 14.  GERD.  continue Protonix 15. Vasovagal episode 7/18: placed teds hose on only with therapy, discussed with therapy 16. Overweight BMI 29.72: provide dietary education 17. 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 This has been discussed with son who is a retired Social research officer, government,     25. Dysphagia: will need MBS 19. Neurogenic bowel and bladder: bladder program 20. Hard of hearing: needs hearing aides, will need to ask son to bring from home.      LOS: 8 days A FACE TO FACE EVALUATION WAS PERFORMED  Brittani Purdum P Adeliz Tonkinson 11/13/2021, 9:15 AM

## 2021-11-13 NOTE — Progress Notes (Signed)
Hypoglycemic Event  CBG: 68  Treatment: 8 oz juice/soda  Symptoms: None  Follow-up CBG: Time: 0645  CBG Result: 77   Possible Reasons for Event: Inadequate meal intake  Comments/MD notified:hypoglycemic protocol initiated    Chilton Si

## 2021-11-13 NOTE — Progress Notes (Addendum)
Speech Language Pathology Weekly Progress and Session Note  Patient Details  Name: Cassandra Dodson MRN: [id number] Date of Birth: Aug 25, 1940  Beginning of progress report period: November 06, 2021 End of progress report period: November 13, 2021  Today's Date: 11/13/2021 SLP Individual Time: 7371-0626 SLP Individual Time Calculation (min): 60 min  Short Term Goals: Week 1: SLP Short Term Goal 1 (Week 1): Patient will consume current diet with minimal overt s/sx of aspiration and with min A verbal cues to implement small bites/sips, single sips. - Benefited from Max A faded to Mod A for implementation of aspiration precautions with current diet consistencies (Dysphagia 3 + NTL). Cough noted x 2 with larger sips of NTL via cup. No clinical s/sx concerning for aspiration with use of small + single bites/sips and slow rate. SLP Short Term Goal 1 - Progress (Week 1): Not met  SLP Short Term Goal 2 (Week 1): Patient will consume PO trials of ice chips and/or thin liquids with minimal overt s/sx of aspiration to indicate readiness for instrumental swallow evaluation. - Tolerated controlled trials of ice chips and 1/2 tsp thin water via spoon with only one instances of cough; otherwise, no clinical s/sx concerning for airway invasion. Will likely be ready for MBSS on Thursday. SLP Short Term Goal 2 - Progress (Week 1): Met  SLP Short Term Goal 3 (Week 1): Patient will orient x4 with max A for use of external aids - Pt oriented to self with Mod I, general location with Sup A (hospital), situation with Sup A, and date with Total A.  SLP Short Term Goal 3 - Progress (Week 1): Met  SLP Short Term Goal 4 (Week 1): Patient will improve intellectual awareness by identifying at least 2 changes (e.g., cognitive, speech, swallowing, physical) with max A verbal and contextual cues - Max A to name 2 changes s/p CVA; recalled 1 out of 2 independently following mass practice and errorless learning.  SLP Short Term Goal 4 -  Progress (Week 1): Not met  SLP Short Term Goal 5 (Week 1): Patient will demonstrate functional recall by using internal/external memory strategies/aids with max A verbal/visual cues  - Max A to recall aspiration precautions during meal consumption; prompts faded to Mod A as task progressed. SLP Short Term Goal 5 - Progress (Week 1): Not met   New Short Term Goals: Week 2: SLP Short Term Goal 1 (Week 2): Patient will consume current diet with minimal overt s/sx of aspiration and with Mod A multimodal cues to implement small + single bites/sip strategy during PO intake. SLP Short Term Goal 2 (Week 2): Pt will participate in MBSS to further determine least restrictive diet with 100% completion. SLP Short Term Goal 3 (Week 2): Pt will demonstrate orientation x 4 with Mod A across 2 sessions. SLP Short Term Goal 4 (Week 2): Patient will improve intellectual awareness by identifying at least 2 changes (e.g., cognitive, speech, swallowing, physical) with max A verbal and contextual cues SLP Short Term Goal 5 (Week 2): Patient will demonstrate functional recall by using internal/external memory strategies/aids with max A verbal/visual cues  Weekly Progress Updates: Pt has demonstrated very slow progress as evident by meeting on 2 out of 5 short-term goals this reporting period. Progress has been limited by fatigue, severity of cognitive deficits, poor intellectual awareness, safety awareness, initiation, inattention, motor planning/sequencing, and impulsivity. Continue to recommend ST intervention during CIR admission to determine least restrictive diet and continue to provide education and strategy training  for use of compensatory cognitive techniques to include use of external aids. Recommend 24/7 supervision/assistance at d/c and [place].   Intensity: Minumum of 1-2 x/day, 30 to 90 minutes Frequency: 3 to 5 out of 7 days Duration/Length of Stay: 2-2.5 weeks Treatment/Interventions: Cognitive  remediation/compensation;Dysphagia/aspiration precaution training;Functional tasks;Patient/family education;Speech/Language facilitation;Therapeutic Activities   Daily Session  Skilled Therapeutic Interventions:     S: Pt seen this date for skilled ST intervention targeting deglutition and cognitive-linguistic goals outlined above. Pt received sleeping in bed, though aroused to name rather quickly. Provided Max A for oral care via suctioning toothbrush for thoroughness. Flat affect noted throughout. Improvement in L attention this date with Min-Mod A verbal cues and clinician placement in L visual field. Transferred from bed <> w/c with Stedy + 2 to optimize alertness and positioning for meal consumption. Agreeable to ST intervention in hospital room.   O: Please see above for objective data re: pt's performance on targeted goals. SLP provided skilled re-education re: reason for CIR admission, current ST POC, aspiration precautions, and importance of implementing during PO intake, current diet consistencies and rationale for such, and re-orientation via use of external aid (e.g. whiteboard). Overall, pt's poor intellectual awareness, safety awareness, initiation, inattention, motor planning/sequencing, and impulsivity negatively impact her performance across functional cognitive-communication tasks, as well as safety for PO intake; therefore continue to recommend full supervision with all PO intake, in addition to 24/7 supervision/assistance at d/c.    A: Pt appears somewhat stimulable for skilled ST interventions in the setting of acute R pontine CVA. Pt demonstrated stimulability for mass practice, corrective feedback, and Max A faded to Mod A multimodal cueing to slow her eating rate, for safety, during meal consumption and Max faded to Mod A for use of external aids and errorless learning to promote orientation. Will continue to monitor readiness for MBSS; less s/sx concerning for aspiration with  controlled PO trials of ice chips and 1/2 tsp sips of water this date in comparison to previous sessions.   P: Pt left upright in w/c with direct hand off to NT for completion of AM meal. All safety measures activated and call bell reviewed and within reach. Continue per current ST POC next session.   Pain None/Denies  Therapy/Group: Individual Therapy  Cort Dragoo A Kalayna Noy 11/13/2021, 8:48 AM

## 2021-11-14 LAB — GLUCOSE, CAPILLARY
Glucose-Capillary: 75 mg/dL (ref 70–99)
Glucose-Capillary: 192 mg/dL — ABNORMAL HIGH (ref 70–99)
Glucose-Capillary: 246 mg/dL — ABNORMAL HIGH (ref 70–99)

## 2021-11-14 NOTE — Progress Notes (Signed)
Occupational Therapy Session Note  Patient Details  Name: Cassandra Dodson MRN: [id number] Date of Birth: 26-Jun-1940  Today's Date: 11/14/2021 OT Individual Time: 5188-4166 OT Individual Time Calculation (min): 56 min    Short Term Goals: Week 2:  OT Short Term Goal 1 (Week 2): Patient will bathe upper body with min assist following set up OT Short Term Goal 2 (Week 2): Patient will don pull over shirt with min assist OT Short Term Goal 3 (Week 2): Patient will trasnfer to/from commode with mod assist OT Short Term Goal 4 (Week 2): Patient will pull pants up/down over hips with mod assist OT Short Term Goal 5 (Week 2): Patient will visualy scan to left to locate needed hygiene or food items  Skilled Therapeutic Interventions/Progress Updates:  Pt asleep in bed upon OT arrival to the room. Pt reports, "What hospital are we in? Is it [place]?" Pt in agreement for OT session. Pt's son entered room shortly after OT session began.    Therapy Documentation Precautions:  Precautions Precautions: Fall Precaution Comments: L hemi, incontinent Restrictions Weight Bearing Restrictions: No Vital Signs (most recent vitals charted by nursing staff): Therapy Vitals Temp: 97.6 F (36.4 C) Temp Source: Oral Pulse Rate: 68 Resp: 19 BP: (!) 157/57 Patient Position (if appropriate): Lying Oxygen Therapy SpO2: 99 % O2 Device: Room Air Pain: Pain Assessment Pain Scale: 0-10 Pain Score: 0-No pain  ADL: Lower Body Dressing: Maximal assistance (Pt requires assistance to complete all portions of task to don pants at bed level. However, pt able to participate in task by elevating RLE for assistance to thread R foot and attempting to participate in pulling up.) Where Assessed-Lower Body Dressing: Bed level Toileting: Maximal assistance (Pt requires assistance to complete 3/3 toileting tasks at bed level due to incontinence of bladder (pt unawre). Pt able to participate in front peri-hygiene,  however, requires assistance for thoroughness and to change brief.) Where Assessed-Toileting: Bed level ADL Comments: Pt requires maximal assistance to complete LB dressing and toileting tasks at bed level. Pt able to perfrm rolling in bed with maximal assistance to the R and moderate assistance to the L. Pt able to use LUE to reach and grab bed rail, however, requires maximal verbal and tactile prompts to initiate movements with LUE. Pt able to participate in various rolling tasks for OT to provide assistance with toileting hygiene. Education provided to pt's son on pt's self-care levels and that self-care as primarily been taking place at bed level to improve safety due to decreased standing balance.   Functional Mobility: Education provided to pt and pt's son on proper transfer techniques to perform SPT. Educated pt and son on importance of scooting to the edge, leaning forward, need to block RLE, and proper body mechanics to provide assistance. Pt able to complete 2 SPT's to the R with moderate assistance on the 1st trial and maximal assist on the 2nd. Pt requires maximal assistance for supine <> sit transfers.   Neuro Principles: Pt's son asked about pt's ability to return to PLOF. OT provides education on how progress can vary from individual to individual. Educated son about the importance of repetition and how this impacts neuro re-education principles. Provided education on how various factors can impact a pt's progress such as location of stroke, time taken to receive medical treatment, PMH, and other factors. Pt's son verbalizes understanding.    Pt returned to bed at end of session. Pt left resting comfortably in bed with personal belongings  and call light within reach, bed alarm on and activated, bed in low position, 3 bed rails up, and comfort needs attended to.    Therapy/Group: Individual Therapy  Barbee Shropshire 11/14/2021, 4:36 PM

## 2021-11-14 NOTE — Progress Notes (Signed)
Patient ID: Cassandra Dodson, female   DOB: 19-Jun-1940, 81 y.o.   MRN: [id number]  This SW covering for primary caregiver, Ovidio Kin.   SW spoke with [name] to provide updates from team conference, d/c date 8/8 and pt not meeting goals in rehab. He was frustrated as he reports that he was not getting the same information from nursing in terms of progress being made here in which he was told she was excelling. SW explained it is likely that she may be making progress in comparison to what she looked like on admission versus now, however it has not been enough progress to meet goals in rehab. SW suggested family education for better idea of her overall care needs. He does not intend to place his mother in a SNF as he promised her he would not. He states he is open to taking her to his home if needed. Fam edu scheduled for Monday (7/31) 9am-until completed.   Loralee Pacas, MSW, [place] Office: [phone] Cell: [phone] Fax: [phone]

## 2021-11-14 NOTE — Progress Notes (Signed)
Physical Therapy Session Note  Patient Details  Name: Cassandra Dodson MRN: [id number] Date of Birth: 10/31/1940  Today's Date: 11/14/2021 PT Individual Time: 0945-1025 PT Individual Time Calculation (min): 40 min   Short Term Goals: Week 2:  PT Short Term Goal 1 (Week 2): Pt will complete bed mobility with modA PT Short Term Goal 2 (Week 2): Pt will complete bed<>chair transfers with modA PT Short Term Goal 3 (Week 2): Pt will propel wheelchair 10ft with maxA PT Short Term Goal 4 (Week 2): Pt will demonstrate improved activity tolerance by completing daily therapy sessions  Skilled Therapeutic Interventions/Progress Updates:       Pt sleeping in bed on arrival, agreeable to therapy session. Denies pain. Requires encouragement and convincing to mobilize OOB. Discussed DC planning and concern for returning home - pt reports "it will be fun" if she goes home because she's "bored of looking at the same picture." With further questioning, able to discern she was referring to the TV. Continues to lack situational awareness and insight into deficits.   Donned disposable pants with totalA (Pt < 25%) at bed level. Instructed her to assist as much as possible and pt unable to initiate this suffiently or to able to reach past her knees to pull up pants. Rolling in bed to don pants, modA for rolling L and totalA for rolling R.   Supine<>sitting EOB with maxA for trunk support and BLE management - pt unable to manage LE without external assist and has motor planning deficits while attempting to sit EOB.   Stedy Transfer to w/c with minA for rising to stand with max cues for forward weight shifting, hand placement, and sequencing. Able to sit in perched position with as little as CGA and can stand from there with minA.  Transported in w/c to day room rehab gym and setup at high/low table. Worked on sit<>stands to high/low table with maxA overall, sometimes modA depending on effort, initiation. L knee  blocked to prevent buckling. Pt with very forward flexed posturing and can correct with multi-modal cues but unable to sustain. Attempted to integrate functional task in standing but pt unable to multi task and loses her balance quickly.   Returned to her room and she remained seated in w/c with safety belt alarm on, call bell in reach, all needs met.    Therapy Documentation Precautions:  Precautions Precautions: Fall Precaution Comments: L hemi, incontinent Restrictions Weight Bearing Restrictions: No General:     Therapy/Group: Individual Therapy   P  11/14/2021, 7:24 AM  

## 2021-11-14 NOTE — Progress Notes (Signed)
Occupational Therapy Session Note  Patient Details  Name: Cassandra Dodson MRN: [id number] Date of Birth: [birth year]  Today's Date: 11/14/2021 OT Individual Time: 1400-1430 OT Individual Time Calculation (min): 30 min    Short Term Goals: Week 2:  OT Short Term Goal 1 (Week 2): Patient will bathe upper body with min assist following set up OT Short Term Goal 2 (Week 2): Patient will don pull over shirt with min assist OT Short Term Goal 3 (Week 2): Patient will trasnfer to/from commode with mod assist OT Short Term Goal 4 (Week 2): Patient will pull pants up/down over hips with mod assist OT Short Term Goal 5 (Week 2): Patient will visualy scan to left to locate needed hygiene or food items  Skilled Therapeutic Interventions/Progress Updates:  Pt just transferred from [place] in w/c back to bed via Steady lift by NT upon OT arrival for PM session. Pt extremely fatigued initially and required incontinence brief and hygiene due to BM incontinence but OT incorporated basic bathing and grooming into session which required significant amount of pt participation for bed mobility due to extent of BM. Pt required mod repetition and cues for attention especially delayed toward L side. Pt was able to roll and perform bridging on command as well as transfer items from R UE to L UE with min tactile and verbal cues. OT assisted pt back up to EOB to address NMRE with functional reach to R side with mod A L sided support. Pt able to perform light grooming reaching for facial moisturizer, hair comb and face cloth on bedside tray with L UE with cues and scanning with 45% accuracy. Transferred each item to R UE for dominant side functional use. Returned pt to supine at end of session with mod A. Left pt bed level with bed alarm active and needs within reach including nurse call bell.     Therapy Documentation Precautions:  Precautions Precautions: Fall Precaution Comments: L hemi, incontinent Restrictions Weight  Bearing Restrictions: No    Therapy/Group: Individual Therapy  Barnabas Lister 11/14/2021, 7:57 AM

## 2021-11-14 NOTE — Progress Notes (Addendum)
PROGRESS NOTE   Subjective/Complaints: Eats 100% of her meals Working with SLP Discussed Cbgs 75-200s Team meeting today  ROS: Limited due to cognitive/behavioral    Objective:   No results found. Recent Labs    11/12/21 0614  WBC 7.9  HGB 12.1  HCT 35.4*  PLT 176    Recent Labs    11/12/21 0614  NA 134*  K 4.4  CL 105  CO2 24  GLUCOSE 105*  BUN 50*  CREATININE 1.42*  CALCIUM 8.7*     Intake/Output Summary (Last 24 hours) at 11/14/2021 4097 Last data filed at 11/14/2021 0700 Gross per 24 hour  Intake 594 ml  Output --  Net 594 ml     Pressure Injury 11/01/21 Sacrum Mid Stage 1 -  Intact skin with non-blanchable redness of a localized area usually over a bony prominence. (Active)  11/01/21 1750  Location: Sacrum  Location Orientation: Mid  Staging: Stage 1 -  Intact skin with non-blanchable redness of a localized area usually over a bony prominence.  Wound Description (Comments):   Present on Admission: Yes    Physical Exam: Vital Signs Blood pressure (!) 135/56, pulse 72, temperature 98.1 F (36.7 C), resp. rate 17, height '5\' 5"'$  (1.651 m), weight 81 kg, SpO2 97 %. Gen: no distress, normal appearing HEENT: oral mucosa pink and moist, NCAT Cardio: Reg rate Chest: normal effort, normal rate of breathing Abd: soft, non-distended Ext: no edema Psych: pleasant, normal affect Skin: mild stage 1 on sacrum Neurological:     Comments: slow to awaken and process.  Mild dysarthria.  Oriented to self and place.  Follows simple commands.   HOH. Cranial nerves II through XII intact, motor strength is 5/5 in RIght and 4- Left deltoid, bicep, tricep, grip,5/5 Right and 2- left  hip flexor, knee extensors, ankle dorsiflexor and plantar flexor Sensory exam normal sensation to light touch and proprioception in bilateral upper and lower extremities   Assessment/Plan: 1. Functional deficits which require 3+ hours  per day of interdisciplinary therapy in a comprehensive inpatient rehab setting. Physiatrist is providing close team supervision and 24 hour management of active medical problems listed below. Physiatrist and rehab team continue to assess barriers to discharge/monitor patient progress toward functional and medical goals  Care Tool:  Bathing    Body parts bathed by patient: Left arm, Face   Body parts bathed by helper: Right arm, Abdomen, Front perineal area, Buttocks, Right upper leg, Left upper leg, Right lower leg, Left lower leg     Bathing assist Assist Level: Maximal Assistance - Patient 24 - 49%     Upper Body Dressing/Undressing Upper body dressing   What is the patient wearing?: Pull over shirt    Upper body assist Assist Level: Moderate Assistance - Patient 50 - 74%    Lower Body Dressing/Undressing Lower body dressing      What is the patient wearing?: Pants, Incontinence brief     Lower body assist Assist for lower body dressing: Total Assistance - Patient < 25%     Toileting Toileting    Toileting assist Assist for toileting: Dependent - Patient 0%     Transfers Chair/bed transfer  Transfers assist  Chair/bed transfer activity did not occur: Safety/medical concerns  Chair/bed transfer assist level: Total Assistance - Patient < 25%     Locomotion Ambulation   Ambulation assist      Assist level: Maximal Assistance - Patient 25 - 49% Assistive device: Parallel bars Max distance: 57f   Walk 10 feet activity   Assist  Walk 10 feet activity did not occur: Safety/medical concerns        Walk 50 feet activity   Assist Walk 50 feet with 2 turns activity did not occur: Safety/medical concerns         Walk 150 feet activity   Assist Walk 150 feet activity did not occur: Safety/medical concerns         Walk 10 feet on uneven surface  activity   Assist Walk 10 feet on uneven surfaces activity did not occur: Safety/medical  concerns         Wheelchair     Assist Is the patient using a wheelchair?: Yes Type of Wheelchair: Manual    Wheelchair assist level: Dependent - Patient 0% Max wheelchair distance: 032f   Wheelchair 50 feet with 2 turns activity    Assist        Assist Level: Dependent - Patient 0%   Wheelchair 150 feet activity     Assist      Assist Level: Dependent - Patient 0%   Blood pressure (!) 135/56, pulse 72, temperature 98.1 F (36.7 C), resp. rate 17, height '5\' 5"'$  (1.651 m), weight 81 kg, SpO2 97 %.    Medical Problem List and Plan: 1. 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   Decreased therapy to 15/7 given fatigue/limited tolerance 2.  Impaired mobility, working on transfers -Continue Lovenox             -antiplatelet therapy: Aspirin 81 mg daily and Plavix 75 mg day x3 to 6 months 3. Pain Management: Tylenol as needed 4. Anxiety: continue Xanax 0.25-0.5 mg nightly as needed             -antipsychotic agents: N/A 5. Neuropsych/cognition: This patient is capable of making decisions on her own behalf. 6. Skin/Wound Care: Routine skin checks 7. Fluids/Electrolytes/Nutrition: Routine in and outs with follow-up chemistries 8.  Left frontal large meningioma.  Follow-up neurosurgery Dr. ElEllene Route Plan for possible surgical resection 8-12 weeks.   -per NS decadron tapered to '2mg'$  q12 x 3 days, completes 7/23 9.  Hypertension.  improved, continue Norvasc 10 mg daily, maintain avapro, increase hydralazine to '10mg'$  TID  -improving with steroid taper 10.  Diabetes mellitus.  Hemoglobin A1c 7.0.   -cbg's  improving -Increase Semglee to 36 units daily for 7/23 -  d/c daytime ISS, discussed with patient and nursing.  -may need further adjustment after steroids dc  CBG (last 3)  Recent Labs    11/13/21 1618 11/13/21 2009 11/14/21 0548  GLUCAP 221* 250* 75      11.  Hyperlipidemia.  Fenofibrate/Crestor 12.  CKD stage III.  Latest creatinine 1.4 on 11/12/21.  Baseline  1.22-1.69.  Monitor weekly 13.  History of endometrial cancer.  Follow-up outpatient 14.  GERD.  continue Protonix 15. Vasovagal episode 7/18: placed teds hose on only with therapy, discussed with therapy 16. Overweight BMI 29.72: provide dietary education 17. 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 This has been discussed with son who is a retired Company secretary and disabled vet 18. Dysphagia: will need MBS, plan for tomorrow. 19. Neurogenic bowel and bladder: bladder program 20. Hard of hearing: needs hearing aides, will need to ask son to bring from home.      LOS: 9 days A FACE TO FACE EVALUATION WAS PERFORMED  Kayleanna Lorman P Shaynah Hund 11/14/2021, 9:22 AM

## 2021-11-14 NOTE — Patient Care Conference (Signed)
Inpatient RehabilitationTeam Conference and Plan of Care Update Date: 11/14/2021   Time: 11:05 AM    Patient Name: Cassandra Dodson      Medical Record Number: [id number]  Date of Birth: 1940/09/28 Sex: Female         Room/Bed: 4W15C/4W15C-01 Payor Info: Payor: [employer] MEDICARE / Plan: [facility] MEDICARE / Product Type: *No Product type* /    Admit Date/Time:  11/05/2021  3:00 PM  Primary Diagnosis:  Right pontine cerebrovascular accident [facility])  Hospital Problems: Principal Problem:   Right pontine cerebrovascular accident [facility])    Expected Discharge Date: Expected Discharge Date: 11/27/21  Team Members Present: Physician leading conference: Dr. Leeroy Cha Social Worker Present: Erlene Quan, BSW Nurse Present: Dorien Chihuahua, RN PT Present: Ginnie Smart, PT OT Present: Other (comment) Raechel Ache, OT) SLP Present: Helaine Chess, SLP PPS Coordinator present : Gunnar Fusi, SLP     Current Status/Progress Goal Weekly Team Focus  Bowel/Bladder     incontinent   Incontinence managed   Incontinence management/toileting  Swallow/Nutrition/ Hydration   Dys 3 diet with NTL liquids; Mod-Max A to implement strategies  Sup A - need to downgrade  MBSS planned for 7/27 to assess readiness for thin liquids.   ADL's   Mod assist upper body bathing and dressing, max assist lower body bathing/ dressing.  Max assist toilet transfer - inconsistent.  Total assist toileting.  Left inattention, decreased sustained attention, left hemiplegia  Min assist overall - BADL  Improve activity tolerance.  Shorter therapy sessions daily seems to help, increase arousal, attention, decrease assist for BADL, postural control and balance   Mobility   totalA bed mobility, maxA squat<>pivot transfers, min to modA for standing using the [place]. Significant cog deficits, L inattention, L sided weakness. Continue to recommend SNF  minA overall but these will likely be downgraded to modA at  wheelchair level.  Activity tolerance, functional transfers, bed mobility, standing and pre-gait training.   Communication   GOAL MET for speech intelligibility  GOAL MET  Not addressing   Safety/Cognition/ Behavioral Observations  Mod-Max A to Max A  Mod A - need to downgrade to Max A  orientation, memory, use of external aids, basic problem-solving, and intellectual awareness.   Pain     N/a        Skin     MASD to sacrum   Skin healing   Keep dry; use skin creams    Discharge Planning:  Patient son plans to move in with patient at D/C. DIL able to assist as well   Team Discussion: Patient is incontinent, with fatigue and poor activity tolerance and little progression noted despite increased initiation and improved orientation. Recommend caregiver training for discharge and recommendation for 24/7 care.  Patient on target to meet rehab goals: no, currently patient needs total assist for bed mobility and max - total assist for squat pivot transfers and mod assist for use of a stedy and stand pivot for toileting. Needs min - mod assist to initiate communication of needs.  Mod  - max assist from a cognitive stand point. Anticipate patient will be at a w/c level at discharge and not ambulating.   *See Care Plan and progress notes for long and short-term goals.   Revisions to Treatment Plan:  MBS 11/15/21   Teaching Needs: Safety, transfers, toileting, medications, dietary modifications, etc  Current Barriers to Discharge: Decreased caregiver support, Incontinence, and Insurance for SNF coverage  Possible Resolutions to Barriers: Family education  Medical Summary Current Status: stage 1 to sacrum, type 2 diabetes with hyperglycemia, right pontine CVA, overweight, meningioma  Barriers to Discharge: Medical stability;Wound care  Barriers to Discharge Comments: stage 1 to sacrum, type 2 diabetes with hyperglycemia, right pontine CVA, overweight, meningioma Possible Resolutions to  [employer]: continue daily wound care, discontinue insulin coverage with meals and increase long acting insulin, provide dietary education, steroids tapered   Continued Need for Acute Rehabilitation Level of Care: The patient requires daily medical management by a physician with specialized training in physical medicine and rehabilitation for the following reasons: Direction of a multidisciplinary physical rehabilitation program to maximize functional independence : Yes Medical management of patient stability for increased activity during participation in an intensive rehabilitation regime.: Yes Analysis of laboratory values and/or radiology reports with any subsequent need for medication adjustment and/or medical intervention. : Yes   I attest that I was present, lead the team conference, and concur with the assessment and plan of the team.   Dorien Chihuahua B 11/14/2021, 2:02 PM

## 2021-11-14 NOTE — Progress Notes (Signed)
Speech Language Pathology Daily Session Note  Patient Details  Name: Cassandra Dodson MRN: [id number] Date of Birth: 1940/09/07  Today's Date: 11/14/2021 SLP Individual Time: 0840-0920 SLP Individual Time Calculation (min): 40 min  Short Term Goals: Week 2: SLP Short Term Goal 1 (Week 2): Patient will consume current diet with minimal overt s/sx of aspiration and with Mod A multimodal cues to implement small + single bites/sip strategy during PO intake. - Did not formally address this session.    SLP Short Term Goal 2 (Week 2): Pt will participate in MBSS to further determine least restrictive diet with 100% completion. - With Mod A for assistance with self-feeding and oral care, pt participated in therapeutic/controlled PO trials of ice chips, 1/2 tsp and tsp sips, 5 cc, and 10 cc via medicine cup of thin water; no clinical s/sx concerning for airway invasion with vocal quality remaining clear at baseline and throughout trials. Pt appears ready for MBSS.  SLP Short Term Goal 3 (Week 2): Pt will demonstrate orientation x 4 with Mod A across 2 sessions. - Pt demonstrated orientation to self, location, and situation x 3 with Sup A questions prompts, and oriented to general time (month, DOW, and year) with Sup A; Total A for date.  SLP Short Term Goal 4 (Week 2): Patient will improve intellectual awareness by identifying at least 2 changes (e.g., cognitive, speech, swallowing, physical) with max A verbal and contextual cues - Pt verbalized at least 1 change s/p CVA (speech) with Sup A question prompts; Total A for swallowing, L inattention, and physical deficits.  SLP Short Term Goal 5 (Week 2): Patient will demonstrate functional recall by using internal/external memory strategies/aids with max A verbal/visual cues - For functional recall of current deficits and aspiration precautions, pt required Total A re-education, faded to Mod-Max A with use of consistent verbal cues and mass practice.  Skilled  Therapeutic Interventions: S: Pt seen this date for skilled ST intervention targeting deglutition and cognitive-linguistic goals outlined above. Pt encountered awake/alert and lying semi-reclined in bed; LPN present to administer morning medications, which pt accepted whole in puree with no clinical observation concerning for airway invasion. Pt attending to L visual field when clinician arrived. Flat affect persists. No c/o pain. Agreeable to ST intervention at bedside.  O: Please see above for objective data re: pt's performance on targeted goals. SLP provided skilled re-education re: current deglutition deficits s/p CVA, recommendations for f/u MBSS, and aspiration precautions. Continue to benefit from elevated vocal intensity, extended processing time, simple language, concrete directives, and gesture use from communication partner to aid in understanding/comprehension. Continue to demonstrate poor verbal initiation and benefits from Sup to Min A question prompts to verbalize wants/needs. With Sup A question cues, pt communicated desire for toileting. Pt transferred from bed <> Stedy + 2 per safety plan. Incidence of incontinence noted as pt was being placed on BSC. LPN present to provide assistance, as this occurred at the end of today's session.  A: Pt appears somewhat stimulable for skilled ST interventions as evident by requiring less cueing for orientation and  to maintain focused attention during functional therapeutic tasks this date, in comparison to previous session. Benefited from mass practice, errorless learning, use of environmental modifications, extended processing time, and concrete directives. Will plan to complete MBSS next date (11/15/2021) to assess physiological competence for least restrictive diet.   P: Pt left in care of nursing for toileting needs. Continue per current ST POC.   Pain None/Denies  Therapy/Group: Individual Therapy  Zackarey Holleman A Keishaun Hazel 11/14/2021, 11:23 AM

## 2021-11-14 NOTE — Plan of Care (Signed)
Goals downgraded due to pt's slower than anticipated progress.  Problem: RH Swallowing Goal: LTG Patient will consume least restrictive diet using compensatory strategies with assistance (SLP) Description: LTG:  Patient will consume least restrictive diet using compensatory strategies with assistance (SLP) Flowsheets (Taken 11/14/2021 1118) LTG: Pt Patient will consume least restrictive diet using compensatory strategies with assistance of (SLP): Moderate Assistance - Patient 50 - 74% Goal: LTG Patient will participate in dysphagia therapy to increase swallow function with assistance (SLP) Description: LTG:  Patient will participate in dysphagia therapy to increase swallow function with assistance (SLP) Flowsheets (Taken 11/14/2021 1118) LTG: Pt will participate in dysphagia therapy to increase swallow function with assistance of (SLP): Moderate Assistance - Patient 50 - 74% Goal: LTG Pt will demonstrate functional change in swallow as evidenced by bedside/clinical objective assessment (SLP) Description: LTG: Patient will demonstrate functional change in swallow as evidenced by bedside/clinical objective assessment (SLP) Flowsheets (Taken 11/14/2021 1118) LTG: Patient will demonstrate functional change in swallow as evidenced by bedside/clinical objective assessment: Oropharyngeal swallow   Problem: RH Cognition - SLP Goal: RH LTG Patient will demonstrate orientation with cues Description:  LTG:  Patient will demonstrate orientation to person/place/time/situation with cues (SLP)   Flowsheets Taken 11/14/2021 1118 by Rich Brave, Liliani Bobo A, CCC-SLP LTG: Patient will demonstrate orientation using cueing (SLP): Moderate Assistance - Patient 50 - 74% Taken 11/06/2021 1620 by Charna Elizabeth T, CCC-SLP LTG Patient will demonstrate orientation to:  Person  Place  Time  Situation   Problem: RH Expression Communication Goal: LTG Patient will increase speech intelligibility (SLP) Description: LTG:  Patient will increase speech intelligibility at word/phrase/conversation level with cues, % of the time (SLP) Flowsheets Taken 11/14/2021 1118 by Matej Sappenfield A, CCC-SLP Level: Conversation level Taken 11/06/2021 1620 by Charna Elizabeth T, CCC-SLP LTG: Patient will increase speech intelligibility (SLP): Supervision Note: GOAL MET   Problem: RH Problem Solving Goal: LTG Patient will demonstrate problem solving for (SLP) Description: LTG:  Patient will demonstrate problem solving for basic/complex daily situations with cues  (SLP) Flowsheets Taken 11/14/2021 1118 by Rich Brave, Rose Hippler A, CCC-SLP LTG Patient will demonstrate problem solving for: Maximal Assistance - Patient 25 - 49% Taken 11/06/2021 1620 by Charna Elizabeth T, CCC-SLP LTG: Patient will demonstrate problem solving for (SLP): Basic daily situations   Problem: RH Memory Goal: LTG Patient will use memory compensatory aids to (SLP) Description: LTG:  Patient will use memory compensatory aids to recall biographical/new, daily complex information with cues (SLP) Flowsheets (Taken 11/14/2021 1118) LTG: Patient will use memory compensatory aids to (SLP): Maximal Assistance - Patient 25 - 49%   Problem: RH Awareness Goal: LTG: Patient will demonstrate awareness during functional activites type of (SLP) Description: LTG: Patient will demonstrate awareness during functional activites type of (SLP) Flowsheets (Taken 11/14/2021 1118) Patient will demonstrate during cognitive/linguistic activities awareness type of:  Intellectual  Emergent LTG: Patient will demonstrate awareness during cognitive/linguistic activities with assistance of (SLP): Maximal Assistance - Patient 25 - 49%

## 2021-11-15 ENCOUNTER — Inpatient Hospital Stay (HOSPITAL_COMMUNITY): Payer: Medicare Other

## 2021-11-15 LAB — GLUCOSE, CAPILLARY
Glucose-Capillary: 95 mg/dL (ref 70–99)
Glucose-Capillary: 90 mg/dL (ref 70–99)
Glucose-Capillary: 138 mg/dL — ABNORMAL HIGH (ref 70–99)
Glucose-Capillary: 169 mg/dL — ABNORMAL HIGH (ref 70–99)

## 2021-11-15 MED ORDER — INSULIN GLARGINE-YFGN 100 UNIT/ML ~~LOC~~ SOLN
37.0000 [IU] | Freq: Every day | SUBCUTANEOUS | Status: DC
Start: 2021-11-15 — End: 2021-11-18
  Administered 2021-11-15 – 2021-11-18 (×4): 37 [IU] via SUBCUTANEOUS
  Filled 2021-11-15 (×5): qty 0.37

## 2021-11-15 NOTE — Plan of Care (Signed)
  DC ambulation goal for home  Problem: RH Ambulation Goal: LTG Patient will ambulate in home environment (PT) Description: LTG: Patient will ambulate in home environment, # of feet with assistance (PT). Outcome: Not Applicable Note: Anticipate pt will not be a safe functional ambulator by time of DC, wheelchair level for mobility    Goals downgraded to modA overall due to slower than anticipated progress and severity of pt's deficits  Problem: RH Balance Goal: LTG Patient will maintain dynamic sitting balance (PT) Description: LTG:  Patient will maintain dynamic sitting balance with assistance during mobility activities (PT) Flowsheets (Taken 11/15/2021 0735) LTG: Pt will maintain dynamic sitting balance during mobility activities with:: Minimal Assistance - Patient > 75% Goal: LTG Patient will maintain dynamic standing balance (PT) Description: LTG:  Patient will maintain dynamic standing balance with assistance during mobility activities (PT) Flowsheets (Taken 11/15/2021 0735) LTG: Pt will maintain dynamic standing balance during mobility activities with:: Moderate Assistance - Patient 50 - 74%   Problem: Sit to Stand Goal: LTG:  Patient will perform sit to stand with assistance level (PT) Description: LTG:  Patient will perform sit to stand with assistance level (PT) Flowsheets (Taken 11/15/2021 0735) LTG: PT will perform sit to stand in preparation for functional mobility with assistance level: Moderate Assistance - Patient 50 - 74%   Problem: RH Bed Mobility Goal: LTG Patient will perform bed mobility with assist (PT) Description: LTG: Patient will perform bed mobility with assistance, with/without cues (PT). Flowsheets (Taken 11/15/2021 0735) LTG: Pt will perform bed mobility with assistance level of: Moderate Assistance - Patient 50 - 74%   Problem: RH Bed to Chair Transfers Goal: LTG Patient will perform bed/chair transfers w/assist (PT) Description: LTG: Patient will perform  bed to chair transfers with assistance (PT). Flowsheets (Taken 11/15/2021 0735) LTG: Pt will perform Bed to Chair Transfers with assistance level: Moderate Assistance - Patient 50 - 74%   Problem: RH Car Transfers Goal: LTG Patient will perform car transfers with assist (PT) Description: LTG: Patient will perform car transfers with assistance (PT). Flowsheets (Taken 11/15/2021 0735) LTG: Pt will perform car transfers with assist:: Moderate Assistance - Patient 50 - 74%   Problem: RH Ambulation Goal: LTG Patient will ambulate in controlled environment (PT) Description: LTG: Patient will ambulate in a controlled environment, # of feet with assistance (PT). Flowsheets (Taken 11/15/2021 0735) LTG: Pt will ambulate in controlled environ  assist needed:: Moderate Assistance - Patient 50 - 74% LTG: Ambulation distance in controlled environment: 78f   Problem: RH Wheelchair Mobility Goal: LTG Patient will propel w/c in controlled environment (PT) Description: LTG: Patient will propel wheelchair in controlled environment, # of feet with assist (PT) Flowsheets (Taken 11/15/2021 0735) LTG: Pt will propel w/c in controlled environ  assist needed:: Minimal Assistance - Patient > 75% LTG: Propel w/c distance in controlled environment: 213fGoal: LTG Patient will propel w/c in home environment (PT) Description: LTG: Patient will propel wheelchair in home environment, # of feet with assistance (PT). Flowsheets (Taken 11/15/2021 0735) LTG: Pt will propel w/c in home environ  assist needed:: Minimal Assistance - Patient > 75% LTG: Propel w/c distance in home environment: 1022f

## 2021-11-15 NOTE — Progress Notes (Signed)
Pt still continuing to show signs of lethargy as morning continued. Pt responding but slower than normal to respond to commands. Worked with PT for a short period of time. Dr Ranell Patrick made aware. New orders placed for CT scan, and MRI, pt transported to CT via bed.   Cassandra Dodson

## 2021-11-15 NOTE — Progress Notes (Signed)
PROGRESS NOTE   Subjective/Complaints: Stat CT ordered for increased lethargy, worsening weakness, decreased ability to sit up straight while eating meals this morning. Cognition appears at baseline  ROS: denies pain  Objective:   No results found. No results for input(s): "WBC", "HGB", "HCT", "PLT" in the last 72 hours.   No results for input(s): "NA", "K", "CL", "CO2", "GLUCOSE", "BUN", "CREATININE", "CALCIUM" in the last 72 hours.    Intake/Output Summary (Last 24 hours) at 11/15/2021 6659 Last data filed at 11/15/2021 9357 Gross per 24 hour  Intake 592 ml  Output --  Net 592 ml     Pressure Injury 11/01/21 Sacrum Mid Stage 1 -  Intact skin with non-blanchable redness of a localized area usually over a bony prominence. (Active)  11/01/21 1750  Location: Sacrum  Location Orientation: Mid  Staging: Stage 1 -  Intact skin with non-blanchable redness of a localized area usually over a bony prominence.  Wound Description (Comments):   Present on Admission: Yes    Physical Exam: Vital Signs Blood pressure 112/77, pulse 80, temperature 98.2 F (36.8 C), temperature source Oral, resp. rate 16, height '5\' 5"'$  (1.651 m), weight 81 kg, SpO2 97 %. Gen: no distress, normal appearing HEENT: oral mucosa pink and moist, NCAT Cardio: Reg rate Chest: normal effort, normal rate of breathing Abd: soft, non-distended Ext: no edema Psych: pleasant, normal affect Skin: mild stage 1 on sacrum Neurological:     Comments: slow to awaken and process.  Mild dysarthria.  Oriented to self and place.  Follows simple commands.   HOH. Cranial nerves II through XII intact, motor strength is 5/5 in RIght and 4- Left deltoid, bicep, tricep, grip,5/5 Right 0/5 left side limited by current lethargy Sensory exam normal sensation to light touch and proprioception in bilateral upper and lower extremities   Assessment/Plan: 1. Functional deficits  which require 3+ hours per day of interdisciplinary therapy in a comprehensive inpatient rehab setting. Physiatrist is providing close team supervision and 24 hour management of active medical problems listed below. Physiatrist and rehab team continue to assess barriers to discharge/monitor patient progress toward functional and medical goals  Care Tool:  Bathing    Body parts bathed by patient: Left arm, Face, Chest   Body parts bathed by helper: Right arm, Abdomen, Front perineal area, Buttocks, Right upper leg, Left upper leg, Right lower leg, Left lower leg     Bathing assist Assist Level: Maximal Assistance - Patient 24 - 49%     Upper Body Dressing/Undressing Upper body dressing   What is the patient wearing?: Pull over shirt    Upper body assist Assist Level: Moderate Assistance - Patient 50 - 74%    Lower Body Dressing/Undressing Lower body dressing      What is the patient wearing?: Pants, Incontinence brief     Lower body assist Assist for lower body dressing: Total Assistance - Patient < 25%     Toileting Toileting    Toileting assist Assist for toileting: Dependent - Patient 0%     Transfers Chair/bed transfer  Transfers assist  Chair/bed transfer activity did not occur: Safety/medical concerns  Chair/bed transfer assist level: Total Assistance -  Patient < 25%     Locomotion Ambulation   Ambulation assist      Assist level: Maximal Assistance - Patient 25 - 49% Assistive device: Parallel bars Max distance: 63f   Walk 10 feet activity   Assist  Walk 10 feet activity did not occur: Safety/medical concerns        Walk 50 feet activity   Assist Walk 50 feet with 2 turns activity did not occur: Safety/medical concerns         Walk 150 feet activity   Assist Walk 150 feet activity did not occur: Safety/medical concerns         Walk 10 feet on uneven surface  activity   Assist Walk 10 feet on uneven surfaces activity did  not occur: Safety/medical concerns         Wheelchair     Assist Is the patient using a wheelchair?: Yes Type of Wheelchair: Manual    Wheelchair assist level: Dependent - Patient 0% Max wheelchair distance: 054f   Wheelchair 50 feet with 2 turns activity    Assist        Assist Level: Dependent - Patient 0%   Wheelchair 150 feet activity     Assist      Assist Level: Dependent - Patient 0%   Blood pressure 112/77, pulse 80, temperature 98.2 F (36.8 C), temperature source Oral, resp. rate 16, height '5\' 5"'$  (1.651 m), weight 81 kg, SpO2 97 %.    Medical Problem List and Plan: 1. 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   Decreased therapy to 15/7 given fatigue/limited tolerance 2.  Impaired mobility, working on transfers -Continue Lovenox             -antiplatelet therapy: Aspirin 81 mg daily and Plavix 75 mg day x3 to 6 months 3. Pain Management: Tylenol as needed 4. Anxiety: continue Xanax 0.25-0.5 mg nightly as needed             -antipsychotic agents: N/A 5. Neuropsych/cognition: This patient is capable of making decisions on her own behalf. 6. Skin/Wound Care: Routine skin checks 7. Fluids/Electrolytes/Nutrition: Routine in and outs with follow-up chemistries 8.  Left frontal large meningioma.  Follow-up neurosurgery Dr. ElEllene Route Plan for possible surgical resection 8-12 weeks.   -per NS decadron tapered to '2mg'$  q12 x 3 days, completes 7/23 9.  Hypertension.  improved, continue Norvasc 10 mg daily, maintain avapro, d/c hydralazine  -improving with steroid taper 10.  Diabetes mellitus.  Hemoglobin  A1c 7.0.   -cbg's improving increase Semglee to 37 units daily  -  d/c ISS -may need further adjustment after steroids dc  CBG (last 3)  Recent Labs    11/14/21 1631 11/14/21 2024 11/15/21 0542  GLUCAP 192* 246* 95      11.  Hyperlipidemia.  Fenofibrate/Crestor 12.  CKD stage III.  Latest creatinine 1.4 on 11/12/21.  Baseline 1.22-1.69.  Monitor weekly 13.  History of endometrial cancer.  Follow-up outpatient 14.  GERD.  continue Protonix 15. Vasovagal episode 7/18: placed teds hose on only  with therapy, discussed with therapy 16. Overweight BMI 29.72: provide dietary education 17. 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 This has been discussed with son who is a retired Company secretary and disabled vet 18. Dysphagia: will need MBS, plan for today, but unable to tolerate this morning due to lethargy. Reschedule as alertness/neuro deficits improve.  19. Neurogenic bowel and bladder: bladder program 20. Hard of hearing: needs hearing aides, will need to ask son to bring from home.  21. Increased lethargy: Stat CH and routine MRI brain without contrast ordered.      LOS: 10 days A FACE TO FACE EVALUATION WAS PERFORMED  Clide Deutscher Amaziah Ghosh 11/15/2021, 9:27 AM

## 2021-11-15 NOTE — Progress Notes (Signed)
While rounding on patient; patient appeared hard to arouse. Vital signs within in normal  Patient was able to state where she was, response to pain. Patient stated she was sleepy. Notified oncoming nurse and charge nurse (day/night). Notified MD.    Yehuda Mao, LPN

## 2021-11-15 NOTE — Progress Notes (Signed)
Physical Therapy Session Note  Patient Details  Name: Cassandra Dodson MRN: [id number] Date of Birth: 09-05-1940  Today's Date: 11/15/2021 PT Individual Time: 0800-0855 PT Individual Time Calculation (min): 55 min   Short Term Goals: Week 2:  PT Short Term Goal 1 (Week 2): Pt will complete bed mobility with modA PT Short Term Goal 2 (Week 2): Pt will complete bed<>chair transfers with modA PT Short Term Goal 3 (Week 2): Pt will propel wheelchair 69f with maxA PT Short Term Goal 4 (Week 2): Pt will demonstrate improved activity tolerance by completing daily therapy sessions  Skilled Therapeutic Interventions/Progress Updates:      Pt sleeping heavily in bed on arrival. Responds to voice but keeps her eyes shut. Noted incontinence of bowel and bladder with heavily saturated brief. TotalA for brief change at bed level. TotalA needed for rolling L<>R in bed with delayed intitation. Pt quickly falling asleep during brief change and unable to sustain adequate alertness. BP taken in supine 131/83 HR 79. She appears to be more confused and lethargic today compared to prior sessions. LPN notified after session.   To improve alertness, initiated sitting EOB - pt requiring dependant to [place] sup[place] to sitting with hospital bed features. Required dependant assit for forward scooting to EOB. Once EOB, her alertness improved, eyes open. She was able to sit EOB for >20 minutes for remainder of session while unsupported with CGA for balance.   She was assisted with eating her breakfast. Pt somewhat impulsive with eating, attempting to eat too large of bites or continue feeding with mouth full. She pockets in L cheek and needed assist for managing this. After each bite, followed with small sips of water via straw, per SLP rec's above bed. Pt ate 50% of her meal with coughing at the end, suspect due to fatigue. Pt reports "I'm tired of swallowing." SLP entering room and discussed this with her. SLP to hold MBS  today due to lethargy.   Pt returned to supine with dependant assist, quickly falling asleep. [facility] with HOB in trendelenburg. All needs met, bed alarm on.   *of note, pt with thin liquid Orange Juice on her meal tray. Safety Zone entered and LPN notified.   Therapy Documentation Precautions:  Precautions Precautions: Fall Precaution Comments: L hemi, incontinent Restrictions Weight Bearing Restrictions: No General:     Therapy/Group: Individual Therapy  [name]7/27/2023, 7:34 AM

## 2021-11-15 NOTE — Progress Notes (Signed)
Patient ID: Cassandra Dodson, female   DOB: Aug 25, 1940, 81 y.o.   MRN: [id number]  This SW covering for primary caregiver, Cassandra Dodson.   SW followed up with [name] to discuss concerns related to pt care needs at discharge. He wanted clarification on how home health therapies will be set up. SW discussed that once Cassandra Dodson has final recommendations, she will call and discuss her care needs and will be provide HHA. States he is working on additional supports to help him with her once she gets to his home. Confirms he will be here for family edu on Monday.   Cassandra Dodson, MSW, [place] Office: [phone] Cell: [phone] Fax: [phone]

## 2021-11-15 NOTE — Progress Notes (Signed)
SLP Cancellation Note  Patient Details Name: SHERRIANN SZUCH MRN: [id number] DOB: 1940-07-25   Cancelled treatment:  MBSS canceled today due to reports of increased lethargy and change in physical assistance requirements. Attempted to see pt for ST intervention; however, off the unit for Stat CT head imaging. Missed 30 minutes of skilled intervention.    Emmelyn Schmale A Pollie Poma 11/15/2021, 10:52 AM

## 2021-11-15 NOTE — Progress Notes (Signed)
Pt arrived back from MRI via bed, pt still lethargic but responding to voice and commands will open her eyes when asked. Pt took all  medication at baseline whole with applesauce. Pt had incont episode of bowl and bladder, peri care done. Pt laying in bed resting at this time. Call bell within reach, all needs met.   Dayna Ramus

## 2021-11-15 NOTE — Progress Notes (Signed)
Occupational Therapy Session Note  Patient Details  Name: Cassandra Dodson MRN: [id number] Date of Birth: February 05, 1941  Today's Date: 11/15/2021 OT Individual Time: 1452-1535 OT Individual Time Calculation (min): 43 min    Short Term Goals: Week 2:  OT Short Term Goal 1 (Week 2): Patient will bathe upper body with min assist following set up OT Short Term Goal 2 (Week 2): Patient will don pull over shirt with min assist OT Short Term Goal 3 (Week 2): Patient will trasnfer to/from commode with mod assist OT Short Term Goal 4 (Week 2): Patient will pull pants up/down over hips with mod assist OT Short Term Goal 5 (Week 2): Patient will visualy scan to left to locate needed hygiene or food items  Skilled Therapeutic Interventions/Progress Updates:  Pt asleep in bed upon OT arrival to the room. Pt reports, "You're a lover." Pt in agreement for OT session.  Therapy Documentation Precautions:  Precautions Precautions: Fall Precaution Comments: L hemi, incontinent Restrictions Weight Bearing Restrictions: No Vital Signs: Please see "Flowsheet" for most recent vitals charted by nursing staff.  Pain: Pain Assessment Pain Scale: 0-10 Pain Score: 0-No pain  ADL: Grooming: Minimal assistance (Pt requires minimal assistance for facial hygiene and thoroughness on the L side while in bed.) Where Assessed-Grooming: bed level Toileting: Dependent (Pt requires total assistance to complete 3/3 toileting tasks at bed level due to incontinence of bladder in brief and pt unaware.) Where Assessed-Toileting: Bed level ADL Comments: Pt frequently closes eyes and falls asleep in between rolling while assistance if provided for toileting tasks. Pt overall presents with decreased alertness than yesterday's session with this therapist.  Sitting Balance: Pt participates in a neuro re-education and cognitive task while sitting at EOB (see below). Pt able to maintain sitting balance at EOB for approx 10 minutes  with close supervision while crossing midline with no LOB noted. Pt does report minimal fatigue with task.   LUE Neuro Re-Education & Cognition: Pt participates in LUE neuro re-education and cognitive task using playing cards. Pt was instructed to use LUE to grasp cards and flip over to the categorize colors into 2 categories (by color). Pt requires minimal assistance to fully grasp cards and flip cards, however, pt able to independently categories cards by color. Pt the reports fatigue in LUE after approx 5 minutes while participating in LUE task. Pt then participates in sequencing cards numerically using R hand to manipulate cards. However, pt requires maximal assistance to sequence cards.   Bed Mobility: Pt requires maximal assist assistance for supine <> sit transfer and to scoot up in bed.    Pt overall presents increased lethargy today vs previous sessions with OT.   Pt returned to bed at end of session. Pt left resting comfortably in bed with personal belongings and call light within reach, bed alarm on and activated, bed in low position, 3 bed rails up, and comfort needs attended to.   Therapy/Group: Individual Therapy  Barbee Shropshire 11/15/2021, 4:24 PM

## 2021-11-16 ENCOUNTER — Inpatient Hospital Stay (HOSPITAL_COMMUNITY): Payer: Medicare Other

## 2021-11-16 DIAGNOSIS — Z515 Encounter for palliative care: Secondary | ICD-10-CM

## 2021-11-16 DIAGNOSIS — D329 Benign neoplasm of meninges, unspecified: Secondary | ICD-10-CM

## 2021-11-16 DIAGNOSIS — R4702 Dysphasia: Secondary | ICD-10-CM

## 2021-11-16 DIAGNOSIS — Z7189 Other specified counseling: Secondary | ICD-10-CM

## 2021-11-16 LAB — CBC
HCT: 33.2 % — ABNORMAL LOW (ref 36.0–46.0)
Hemoglobin: 11.2 g/dL — ABNORMAL LOW (ref 12.0–15.0)
MCH: 31 pg (ref 26.0–34.0)
MCHC: 33.7 g/dL (ref 30.0–36.0)
MCV: 92 fL (ref 80.0–100.0)
Platelets: 152 10*3/uL (ref 150–400)
RBC: 3.61 MIL/uL — ABNORMAL LOW (ref 3.87–5.11)
RDW: 14.6 % (ref 11.5–15.5)
WBC: 7.2 10*3/uL (ref 4.0–10.5)
nRBC: 0 % (ref 0.0–0.2)

## 2021-11-16 LAB — URINALYSIS, ROUTINE W REFLEX MICROSCOPIC
Bilirubin Urine: NEGATIVE
Glucose, UA: >=500 mg/dL — AB
Hgb urine dipstick: NEGATIVE
Ketones, ur: NEGATIVE mg/dL
Nitrite: POSITIVE — AB
Protein, ur: NEGATIVE mg/dL
Specific Gravity, Urine: 1.011 (ref 1.005–1.030)
pH: 6 (ref 5.0–8.0)

## 2021-11-16 LAB — BASIC METABOLIC PANEL
Anion gap: 7 (ref 5–15)
BUN: 44 mg/dL — ABNORMAL HIGH (ref 8–23)
CO2: 25 mmol/L (ref 22–32)
Calcium: 9 mg/dL (ref 8.9–10.3)
Chloride: 101 mmol/L (ref 98–111)
Creatinine, Ser: 1.44 mg/dL — ABNORMAL HIGH (ref 0.44–1.00)
GFR, Estimated: 36 mL/min — ABNORMAL LOW (ref >60)
Glucose, Bld: 194 mg/dL — ABNORMAL HIGH (ref 70–99)
Potassium: 4.4 mmol/L (ref 3.5–5.1)
Sodium: 133 mmol/L — ABNORMAL LOW (ref 135–145)

## 2021-11-16 LAB — SARS CORONAVIRUS 2 BY RT PCR: SARS Coronavirus 2 by RT PCR: NEGATIVE

## 2021-11-16 LAB — GLUCOSE, CAPILLARY
Glucose-Capillary: 86 mg/dL (ref 70–99)
Glucose-Capillary: 162 mg/dL — ABNORMAL HIGH (ref 70–99)
Glucose-Capillary: 266 mg/dL — ABNORMAL HIGH (ref 70–99)

## 2021-11-16 NOTE — Consult Note (Signed)
Palliative Care Consult Note                                  Date: 11/16/2021   Patient Name: Cassandra Dodson  DOB: 08/20/1940  MRN: [id number]  Age / Sex: 81 y.o., female  PCP: Shon Baton, MD Referring Physician: Izora Ribas, MD  Reason for Consultation: Establishing goals of care  HPI/Patient Profile: 81 y.o. female  with past medical history of endometrial cancer, CKD stage IIIb, diabetes mellitus type 2, hypertension, hyperlipidemia, and GERD.  She initially presented to [facility] ED on 10/25/2021 with complaints of slurred speech and left-sided weakness.  CT revealed an intracranial mass likely to be meningioma associated with left to right midline shift with extensive vasogenic edema in the left frontal lobe.  MRI confirmed meningioma and also showed large acute right pontine infarct. On 7/13, patient underwent cerebral angiogram with angioplasty and basilar artery stenting. She was admitted to CIR on 11/05/2021.   Past Medical History:  Diagnosis Date   Arthritis    CKD (chronic kidney disease), stage III ([place])    patient denies   Depression    Diabetes mellitus without complication ([place])    Difficult intravenous access    Endometrial cancer ([place])    GERD (gastroesophageal reflux disease)    Headache    History of radiation therapy 11/04/2019-12/01/2019   Endometrial HDR; Dr. Gery Pray   Hypertension    Hypothyroidism    Neuromuscular disorder ([place])    neuropathy in feet   Osteoarthritis    PMB (postmenopausal bleeding)    PONV (postoperative nausea and vomiting)    severe nausea and vomiting after knee replacement 06-2014, did ok with 2018 knee replacement   Urinary frequency    Wears glasses     Subjective:   I have reviewed medical records including EPIC notes, labs and imaging, received report from the team, and assessed the patient at bedside.  She is alert and oriented.  She is able to answer questions, but  gives very short answers.  Her affect is flat/irritable and she does not seem willing and/or able to engage in [place] conversation at this time  I later met at bedside with her son [name] to discuss diagnosis, prognosis, [place], EOL wishes, disposition, and options.  [name] shares that he is her only child as well as her HCPOA.  I introduced Palliative Medicine as specialized medical care for people living with serious illness. It focuses on providing relief from the symptoms and stress of a serious illness.   Created space and opportunity for patient and family to explore thoughts and feelings regarding current medical situation. Values and goals of care important to patient and family were attempted to be elicited.  A discussion was had today regarding advanced directives. Concepts specific to code status, artifical feeding and hydration, continued IV antibiotics and rehospitalization was had.    Questions and concerns addressed. Family encouraged to call with questions or concerns.     Life Review: Cassandra Dodson was born in [place] but has lived "all over the world" including [place], [place] and [place], [place].  She has a PhD in [place] and psychology.  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  Functional Status: Prior to admission Cassandra Dodson lived alone in a loft apartment in downtown [place].  She was ambulatory and independent with her ADLs.  [name] lives nearby and  [place] and checked on her daily.  Goals: To make as full recovery as possible.  Additional Discussion: We discussed her current illness and what it means in the larger context of her ongoing co-morbidities. Discussed the natural disease trajectory of a sudden, severe neurological injury such as stroke. Discussed that patient may not return to his/her previous baseline. Provided education that prognostication can be challenging due to the uncertainty predicting neurologic outcome as well as the time  required to allow for optimal recovery (weeks to sometimes months).  The difference between full scope medical intervention and comfort care was considered.  Fitchett verbalizes understanding of the seriousness of his mothers medical situation and understands she has "a long road ahead" to recovery. He remains open to all offered and available medical interventions to report her recovery.  He is hopeful that she will ultimately be able to get back as close as possible to her previous baseline.  [name] reports that his mother has an excellent support system in place.  He plans to stay with her in her apartment initially.  He has close friends (who are [employer] currently on leave) that have offered to stay with Cassandra Dodson while he is at work.  If needed, he is willing to have his mother stay with him and his wife in their home.  He would not consider SNF placement under any circumstances.      Review of Systems  Musculoskeletal:  Positive for back pain.    Objective:   Primary Diagnoses: Present on Admission:  Right pontine cerebrovascular accident [facility]   Physical Exam Vitals reviewed.  Constitutional:      General: She is not in acute distress.    Comments: Chronically ill-appearing  Neurological:     Mental Status: She is alert and oriented to person, place, and time.     Motor: Weakness present.  Psychiatric:        Mood and Affect: Affect is flat.     Vital Signs:  BP (!) 145/81 (BP Location: Left Arm)   Pulse 94   Temp 98.5 F (36.9 C)   Resp 16   Ht 5' 5"  (1.651 m)   Wt 81 kg   SpO2 97%   BMI 29.72 kg/m   Palliative Assessment/Data: PPS 40%     Assessment & Plan:   SUMMARY OF RECOMMENDATIONS   ***  Primary Decision Maker: {Primary Decision YNWGN:56213}  Code Status/Advance Care Planning: {Palliative Code status:23503}  Symptom Management:  ***  Prognosis:  {Palliative Care Prognosis:23504}  Discharge Planning:  {Palliative dispostion:23505}    Discussed with: ***    Thank you for allowing [place] to participate in the care of Cassandra Dodson   Time Total: ***  Greater than 50%  of this time was spent counseling and coordinating care related to the above assessment and plan.  Signed by: Elie Confer, NP Palliative Medicine Team  Team Phone # [phone]  For individual providers, please see AMION

## 2021-11-16 NOTE — Progress Notes (Signed)
PROGRESS NOTE   Subjective/Complaints: Continues to be more somnolent than usual. Denies pain Tolerated PT today Incontinent  ROS: denies pain, +urinary incontinence  Objective:   MR BRAIN WO CONTRAST  Result Date: 11/15/2021 CLINICAL DATA:  Mental status change, unknown cause. EXAM: MRI HEAD WITHOUT CONTRAST TECHNIQUE: Multiplanar, multiecho pulse sequences of the brain and surrounding structures were obtained without intravenous contrast. COMPARISON:  MRI of the brain October 25, 2021. FINDINGS: Brain: Again seen is area of restricted diffusion in the right paramedian pons which appear mildly increased in volume when compared to prior MRI. There is a new punctate focus of restricted diffusion within the left side of the pons. Punctate focus of increased signal on DWI without correlating low signal on ADC in the left parietal lobe remains unchanged and likely represent a subacute infarct. No acute hemorrhage or hydrocephalus. 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 There is surrounding vasogenic edema, mildly improved when compared to prior MRI. Remote infarcts in the right cerebellar hemisphere, left thalamus, bilateral corona radiata and right centrum semiovale. Scattered confluent foci of T2 hyperintensity within the white matter of the cerebral hemispheres, most likely related to chronic microangiopathy. Vascular: Major flow voids are preserved. Rightward deviation of the bilateral A2/ACA segment by the above described meningioma. Skull and upper cervical spine: Normal marrow signal. Sinuses/Orbits: Negative. Other: None. IMPRESSION: 1. Right paramedian pontine subacute infarct appears to have mildly  expanded when compared to prior MRI. 2. New punctate focus of restricted diffusion on the left side of the pons, consistent with acute/subacute. 3. Stable appearance of a 5 cm plan sphenoidale meningioma with mild interval improvement of the surrounding vasogenic edema. Electronically Signed   By: Pedro Earls M.D.   On: 11/15/2021 11:23   CT HEAD WO CONTRAST (5MM)  Result Date: 11/15/2021 CLINICAL DATA:  81 year old female with altered mental status. Endovascular treatment of high-grade distal basilar artery stenosis earlier this month. Right paracentral brainstem infarct on 10/25/2021. Relatively large anterior cranial fossa meningioma. EXAM: CT HEAD WITHOUT CONTRAST TECHNIQUE: Contiguous axial images were obtained from the base of the skull through the vertex without intravenous contrast. RADIATION DOSE REDUCTION: This exam was performed according to the departmental dose-optimization program which includes automated exposure control, adjustment of the mA and/or kV according to patient size and/or use of iterative reconstruction technique. COMPARISON:  Head CT and brain MRI 10/25/2021. FINDINGS: Brain: Lobulated and hyperdense left paracentral planum sphenoidal meningioma is 49 x 41 x 36 mm (AP by transverse by CC) and stable from earlier this month with regional mass effect and edema. No ventriculomegaly. No acute intracranial hemorrhage identified. No midline shift or acute intracranial mass effect. Basilar cisterns remain patent. Chronic small cerebellar infarcts. Right pontine infarct better demonstrated by MRI. Patchy and confluent cerebral white matter hypodensity is stable. No cortically based acute infarct identified. Vascular: Calcified atherosclerosis at the skull base. New distal basilar artery stent. No suspicious intracranial vascular hyperdensity. Skull:  No acute osseous abnormality identified. Sinuses/Orbits: Mild mucosal thickening in addition to bubbly opacity in the  posterior right ethmoid air cell now. Other Visualized paranasal sinuses and mastoids are stable and well aerated. Other: Visualized orbits and scalp soft tissues are within normal limits. IMPRESSION: 1. No new intracranial abnormality. 2. New distal basilar artery stent placed earlier this month. Prior posterior circulation ischemia. 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 Electronically Signed   By: Genevie Ann M.D.   On: 11/15/2021 09:29   No results for input(s): "WBC", "HGB", "HCT", "PLT" in the last 72 hours.   No results for input(s): "NA", "K", "CL", "CO2", "GLUCOSE", "BUN", "CREATININE", "CALCIUM" in the last 72 hours.    Intake/Output Summary (Last 24 hours) at 11/16/2021 0924 Last data filed at 11/16/2021 0800 Gross per 24 hour  Intake 716 ml  Output --  Net 716 ml     Pressure Injury 11/01/21 Sacrum Mid Stage 1 -  Intact skin with non-blanchable redness of a localized area usually over a bony prominence. (Active)  11/01/21 1750  Location: Sacrum  Location Orientation: Mid  Staging: Stage 1 -  Intact skin with non-blanchable redness of a localized area usually over a bony prominence.  Wound Description (Comments):   Present on Admission: Yes    Physical Exam: Vital Signs Blood pressure (!) 146/54, pulse 70, temperature 98.4 F (36.9 C), temperature source Oral, resp. rate 18, height '5\' 5"'$  (1.651 m), weight 81 kg, SpO2 99 %. Gen: no distress, normal appearing, has been more somnolent during therapy sessions HEENT: oral mucosa pink and moist, NCAT Cardio: Reg rate Chest: normal effort, normal rate of breathing Abd: soft, non-distended Ext: no edema Psych: pleasant, normal affect Skin: mild stage 1 on sacrum Neurological:     Comments: slow to awaken and process.  Mild dysarthria.  Oriented to self and place.  Follows simple commands.   HOH. Cranial nerves II through XII intact, motor strength is 5/5 in RIght and 4- Left deltoid, bicep,  tricep, grip,5/5 Right 0/5 left side limited by current lethargy Sensory exam normal sensation to light touch and proprioception in bilateral upper and lower extremities   Assessment/Plan: 1. Functional deficits which require 3+ hours per day of interdisciplinary therapy in a comprehensive inpatient rehab setting. Physiatrist is providing close team supervision and 24 hour management of active medical problems listed below. Physiatrist and rehab team continue to assess barriers to discharge/monitor patient progress toward functional and medical goals  Care Tool:  Bathing    Body parts bathed by patient: Left arm, Face, Chest   Body parts bathed by helper: Right arm, Abdomen, Front perineal area, Buttocks, Right upper leg, Left upper leg, Right lower leg, Left lower leg     Bathing assist Assist Level: Maximal Assistance - Patient 24 - 49%     Upper Body Dressing/Undressing Upper body dressing   What is the patient wearing?: Pull over shirt    Upper body assist Assist Level: Moderate Assistance - Patient 50 - 74%    Lower Body Dressing/Undressing Lower body dressing      What is the patient wearing?: Pants, Incontinence brief     Lower body assist Assist for lower body dressing: Total Assistance - Patient < 25%     Toileting Toileting    Toileting assist Assist for toileting: Dependent - Patient 0%     Transfers Chair/bed transfer  Transfers assist  Chair/bed transfer activity did not occur: Safety/medical concerns  Chair/bed transfer assist level: Total Assistance - Patient < 25%     Locomotion Ambulation   Ambulation assist      Assist level: Maximal Assistance - Patient 25 - 49% Assistive device: Parallel bars Max distance: 14f   Walk 10 feet activity   Assist  Walk 10 feet activity did not occur: Safety/medical concerns        Walk 50 feet activity   Assist Walk 50 feet with 2 turns activity did not occur: Safety/medical concerns          Walk 150 feet activity   Assist Walk 150 feet activity did not occur: Safety/medical concerns         Walk 10 feet on uneven surface  activity   Assist Walk 10 feet on uneven surfaces activity did not occur: Safety/medical concerns         Wheelchair     Assist Is the patient using a wheelchair?: Yes Type of Wheelchair: Manual    Wheelchair assist level: Dependent - Patient 0% Max wheelchair distance: 093f   Wheelchair 50 feet with 2 turns activity    Assist        Assist Level: Dependent - Patient 0%   Wheelchair 150 feet activity     Assist      Assist Level: Dependent - Patient 0%   Blood pressure (!) 146/54, pulse 70, temperature 98.4 F (36.9 C), temperature source Oral, resp. rate 18, height '5\' 5"'$  (1.651 m), weight 81 kg, SpO2 99 %.    Medical Problem List and Plan: 1. 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   Updated son  Decreased therapy to 15/7 given fatigue/limited tolerance 2.  Impaired mobility, working on transfers -Continue Lovenox             -antiplatelet therapy: Aspirin 81 mg daily and Plavix 75 mg day x3 to 6 months 3. Pain Management: Tylenol as needed 4. Anxiety: continue Xanax 0.25-0.5 mg nightly as needed             -antipsychotic agents: N/A 5. Neuropsych/cognition: This patient is capable of making decisions on her own behalf. 6. Skin/Wound Care: Routine skin checks 7. Fluids/Electrolytes/Nutrition: Routine in and outs with follow-up chemistries 8.  Left frontal large meningioma.  Follow-up neurosurgery Dr. ElEllene Route Plan for possible surgical resection 8-12  weeks.   -per NS decadron tapered to '2mg'$  q12 x 3 days, completes 7/23 9.  Hypertension.  improved, continue Norvasc 10 mg daily, maintain avapro, d/c hydralazine  -improving with steroid taper 10.  Diabetes mellitus.  Hemoglobin A1c 7.0.   -cbg's improving increase Semglee to 37 units daily  -  d/c ISS Well controlled, decrease CBG checks to AC BID -may need further adjustment after steroids dc  CBG (last 3)  Recent Labs    11/15/21 1106 11/15/21 1625 11/16/21 0553  GLUCAP 138* 169* 86      11.  Hyperlipidemia.  Fenofibrate/Crestor 12.  CKD stage III.  Latest creatinine 1.4 on 11/12/21.  Baseline 1.22-1.69.  Monitor weekly 13.  History of endometrial  cancer.  Follow-up outpatient 14.  GERD.  continue Protonix 15. Vasovagal episode 7/18: placed teds hose on only with therapy, discussed with therapy 16. Overweight BMI 29.72: provide dietary education 17. 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 This has been discussed with son who is a retired Company secretary and disabled vet 18. Dysphagia: will need MBS, plan for today, but unable to tolerate this morning due to lethargy. Reschedule as alertness/neuro deficits improve.  19. Neurogenic bowel and bladder: bladder program 20. Hard of hearing: needs hearing aides, will need to ask son to bring from home.  21. Increased lethargy: Stat CH and routine MRI brain without contrast ordered. Shows small increase in prior stroke, discussed with son and neurology     LOS: 11 days A FACE TO FACE EVALUATION WAS PERFORMED  Martha Clan P Rifka Ramey 11/16/2021, 9:24 AM

## 2021-11-16 NOTE — Progress Notes (Signed)
Physical Therapy Session Note  Patient Details  Name: Cassandra Dodson MRN: [id number] Date of Birth: 1940-05-13  Today's Date: 11/16/2021 PT Individual Time: 0830-0858 PT Individual Time Calculation (min): 28 min   Short Term Goals: Week 1:  PT Short Term Goal 1 (Week 1): Pt will complete bed mobility with modA PT Short Term Goal 2 (Week 1): Pt will complete bed<>chair transfers with modA and LRAD PT Short Term Goal 3 (Week 1): Pt will ambulate 31f with modA and LRAD PT Short Term Goal 4 (Week 1): Pt will participate in functional outcome measure to assess falls risk Week 2:  PT Short Term Goal 1 (Week 2): Pt will complete bed mobility with modA PT Short Term Goal 2 (Week 2): Pt will complete bed<>chair transfers with modA PT Short Term Goal 3 (Week 2): Pt will propel wheelchair 157fwith maxA PT Short Term Goal 4 (Week 2): Pt will demonstrate improved activity tolerance by completing daily therapy sessions Week 3:     Skilled Therapeutic Interventions/Progress Updates:    Pt denies pain Pt initially awake and agreeable to session. Total assist for donning TED hose. Pt found to be incontinent of urine. Rolled mult times w/cues and rails to L side, mod assist for lower body and use of rails to full R.  Pt increasingly somulent requiring repeated cueing to continue w/mobility.  Total assist for brief change and perihygiene,  max to total for donning pants. Pt left w/hob elevated, alarm set, rails up x 3, needs in reach.   Therapy Documentation Precautions:  Precautions Precautions: Fall Precaution Comments: L hemi, incontinent Restrictions Weight Bearing Restrictions: No    Therapy/Group: Individual Therapy BaCallie FieldingPTBelfield/28/2023, 8:58 AM

## 2021-11-16 NOTE — Progress Notes (Signed)
   11/16/21 1000  What Happened  Was fall witnessed? Yes  Who witnessed fall? [place] NT  Patients activity before fall ambulating-assisted  Point of contact buttocks  Was patient injured? No  Follow Up  MD notified Kingsley Callander, MD  Time MD notified 57  Family notified Yes - comment (By MD)  Additional tests Yes-comment (CBC, Xray, Covid)  Progress note created (see row info) Yes  Adult Fall Risk Assessment  Risk Factor Category (scoring not indicated) Fall has occurred during this admission (document High fall risk)  Patient Fall Risk Level High fall risk  Adult Fall Risk Interventions  Required Bundle Interventions *See Row Information* High fall risk - low, moderate, and high requirements implemented  Additional Interventions Use of appropriate toileting equipment (bedpan, BSC, etc.);Room near nurses station  Screening for Fall Injury Risk (To be completed on HIGH fall risk patients) - Assessing Need for Floor Mats  Risk For Fall Injury- Criteria for Floor Mats Bleeding risk-anticoagulation (not prophylaxis)  Will Implement Floor Mats Yes  Vitals  Temp 98.3 F (36.8 C)  Temp Source Oral  BP 137/63  BP Location Left Arm  BP Method Automatic  Patient Position (if appropriate) Lying  Oxygen Therapy  SpO2 99 %  O2 Device Room Air  Pain Assessment  Pain Scale Faces  Faces Pain Scale 0  Pain Type Acute pain  Neurological  Neuro (WDL) X  Level of Consciousness Alert  Orientation Level Oriented to person;Oriented to place;Disoriented to time;Disoriented to situation  Scientist, forensic Coma Scale  Eye Opening 3  Best Verbal Response (NON-intubated) 4  Best Motor Response 6  Glasgow Coma Scale Score 13   Nursing called to room. ST and NT in room with pt. Pt assisted to ground from wheelchair. No change in pts mental status. No head injury. Pt transferred back to bed with maxie sky lift with no complications.

## 2021-11-16 NOTE — Progress Notes (Addendum)
Speech Language Pathology Daily Session Note  Patient Details  Name: Cassandra Dodson MRN: [id number] Date of Birth: 1940/06/07  Today's Date: 11/16/2021 SLP Individual Time: 0920-1020 SLP Individual Time Calculation (min): 60 min  Short Term Goals: Week 2: SLP Short Term Goal 1 (Week 2): Patient will consume current diet with minimal overt s/sx of aspiration and with Mod A multimodal cues to implement small + single bites/sip strategy during PO intake. - Did not formally address this date.   SLP Short Term Goal 2 (Week 2): Pt will participate in MBSS to further determine least restrictive diet with 100% completion. - Following Total A for oral care for thoroughness, pt participated in therapeutic PO trials of ice chips, 1/2 tsp sips of water via spoon, and 5-10 cc of thin water via medicine cup. No clinical s/sx concerning for airway invasion; no coughing, throat clearing, nor change in vocal quality. Respirations remained even and unlabored throughout. Nevertheless, pt did appear increasing fatigued as trials continued in w/c.  SLP Short Term Goal 3 (Week 2): Pt will demonstrate orientation x 4 with Mod A across 2 sessions. - Pt oriented to self, location (hospital), situation, month, and year with Sup A question prompts and no use of external aids. Min A to orient to DOW. Total A for date.  SLP Short Term Goal 4 (Week 2): Patient will improve intellectual awareness by identifying at least 2 changes (e.g., cognitive, speech, swallowing, physical) with max A verbal and contextual cues. - On probe, pt denied changes s/p CVA; however, with Max A verbal and contextual cues, pt verbalized acute impairments in deglutition and mobility.   SLP Short Term Goal 5 (Week 2): Patient will demonstrate functional recall by using internal/external memory strategies/aids with max A verbal/visual cues. - Max A for functional recall of aspiration precautions and recalling rules to play simple card game ("Go  Fish")  Skilled Therapeutic Interventions: S: Pt seen this date for skilled ST intervention targeting deglutition and cognitive-linguistic goals outlined above. Pt received sleeping in bed; slowly aroused to name. Lethargic throughout today's session. Requesting water. No c/o pain. Agreeable to ST intervention in hospital room. Transferred from bed<>w/c with Stedy + 2. When asked if she would like to use the bathroom, she said "yes." Transferred from Stedy<>BSC + 2. Voided and defecated in toilet. Total A for peri care and management of lower body dressing + Max to Total A to maintain upright positioning during peri care. Returned back to w/c and began to set pt up at sink for oral care. Pt initially refusing oral care at sink, and only amenable to oral care via suctioning. Total A for oral care for thoroughness. When asked, pt endorses lightheadedness; BP taken and read 106/50 and HR 72.   O: Please see above for objective data re: pt's performance on targeted goals. SLP provided skilled education re: ST POC, current deficits to improve intellectual awareness, and plan for MBSS next week pending ongoing tolerance of thin water, in addition to stable medical and mental status. No clinical s/sx concerning for airway invasion when provided with therapeutic and controlled PO trials of thin water. Upon inquiry, pt reports interest in card games at baseline; therefore, engaged pt in therapeutic attention, recall, and problem-solving task with card deck. Pt benefited from Mod A verbal and visual cues to sort cards into appropriate suits and Max to Total A to participate in brief game of "Go Fish." Only able to maintain attention to task for ~5 minutes prior to  requiring rest break. At the end of today's session, pt with large episode of emesis (sitting upright at 90 degree angle in w/c). Total A for oral care after emesis and for clean up and changing scrub top. SLP left to get scrub top with NT in with pt. When SLP  returned NT reporting pt is going to fall. NT and SLP assisted pt to the ground gently; LPN, charge RN, and medical team made aware. Vitals assessed and WNL. Safety Zone report submitted.  A: Pt minimally stimulable to skilled ST interventions despite use of Max A verbal and visual cues. Continue to recommend Dysphagia 3 and nectar-thick liquid consistencies, with full supervision and implementation of aspiration precautions, until MBSS can be completed next week. Will need 24/7 supervision and assistance at discharge. At this time, prognosis for significant cognitive improvement is guarded.  P: Pt left in nursing care s/p assisted fall (onto glutes) to ground with NT, LPN, and charge RN present for vitals. Pt denies pain and states she feels "OK."  Pain None/Denies  Therapy/Group: Individual Therapy  Junah Yam A Francisco Eyerly 11/16/2021, 3:58 PM

## 2021-11-16 NOTE — Progress Notes (Signed)
Occupational Therapy Session Note  Patient Details  Name: Cassandra Dodson MRN: [id number] Date of Birth: [birth year]  Today's Date: 11/16/2021 OT Individual Time: 1032-1100 OT Individual Time Calculation (min): 28 min    Short Term Goals: Week 2:  OT Short Term Goal 1 (Week 2): Patient will bathe upper body with min assist following set up OT Short Term Goal 2 (Week 2): Patient will don pull over shirt with min assist OT Short Term Goal 3 (Week 2): Patient will trasnfer to/from commode with mod assist OT Short Term Goal 4 (Week 2): Patient will pull pants up/down over hips with mod assist OT Short Term Goal 5 (Week 2): Patient will visualy scan to left to locate needed hygiene or food items  Skilled Therapeutic Interventions/Progress Updates:    Patient scheduled for OT at 10:30.  Arrived at this time and nursing assisting patient who was seated on the floor.  Patient with reported pain in buttocks, crying out with attempts to move her into supine position.  Patient assisted into sling and lifted from floor - MD, PA, RN present to assess patient.  Patient reported pain resolved as soon as lifted off floor and positioned in bed.  Patient lethargic.  RN reported that patient threw up - MD ordered labwork to rule out medical issues.   Assisted patient to clean up  and reposition in bed for comfort.  Missed therapy due to awaiting lab/ imaging, and patient lethargy.  Therapy Documentation Precautions:  Precautions Precautions: Fall Precaution Comments: L hemi, incontinent Restrictions Weight Bearing Restrictions: No General: General OT Amount of Missed Time: 30 Minutes Vital Signs: Therapy Vitals Temp: 98.3 F (36.8 C) Temp Source: Oral BP: 137/63 Patient Position (if appropriate): Lying Oxygen Therapy SpO2: 99 % O2 Device: Room Air Pain: Pain Assessment Pain Scale: Faces Pain Score: 0-No pain - initially patient reporting pain in pelvis, buttocks Faces Pain Scale: No hurt Pain  Type: Acute pain     Therapy/Group: Individual Therapy  Mariah Milling 11/16/2021, 11:31 AM

## 2021-11-17 LAB — GLUCOSE, CAPILLARY
Glucose-Capillary: 92 mg/dL (ref 70–99)
Glucose-Capillary: 112 mg/dL — ABNORMAL HIGH (ref 70–99)
Glucose-Capillary: 134 mg/dL — ABNORMAL HIGH (ref 70–99)
Glucose-Capillary: 224 mg/dL — ABNORMAL HIGH (ref 70–99)

## 2021-11-17 MED ORDER — SODIUM CHLORIDE 0.9 % IV SOLN
1.0000 g | Freq: Every day | INTRAVENOUS | Status: DC
  Administered 2021-11-17 – 2021-11-18 (×2): 1 g via INTRAVENOUS
  Filled 2021-11-17 (×3): qty 10

## 2021-11-17 NOTE — Progress Notes (Signed)
Occupational Therapy Session Note  Patient Details  Name: Cassandra Dodson MRN: [id number] Date of Birth: 1940/12/01  Today's Date: 11/17/2021 OT Individual Time: 0818-0900 OT Individual Time Calculation (min): 42 min    Short Term Goals: Week 1:  OT Short Term Goal 1 (Week 1): Patient will identify need to void and have at least 1 continent episode OT Short Term Goal 1 - Progress (Week 1): Not met OT Short Term Goal 2 (Week 1): Patient will transfer to [facility] with mod assist OT Short Term Goal 2 - Progress (Week 1): Not met OT Short Term Goal 3 (Week 1): Patient will dress upper body with min assist (excluding bra) OT Short Term Goal 3 - Progress (Week 1): Not met OT Short Term Goal 4 (Week 1): Patient will dress lower body with max assist OT Short Term Goal 4 - Progress (Week 1): Not met OT Short Term Goal 5 (Week 1): Patient will bathe upper body with min assist following set up OT Short Term Goal 5 - Progress (Week 1): Not met  Skilled Therapeutic Interventions/Progress Updates:     Pt received in bed with no pain teds donned total A and BP assessed EOB 112/69. No dizziness reported during session VSS ADL: Pt self feeds seated EOB with initial MIN A with multimodal cuing for midline orientation. Over 25-30 min period of sitting EOB with with increased postural sway EOB requiring up to MOD A for tactile input to sit EOB. Pt with LUE on therapist lap pushing through to help with sitting balance/WB. Pt self feeds with MOD Cuing overall for bite size and alternating solids/liquids per SLP protocol. No over s/s of aspiration.  Pt left at end of session in bed with exit alarm on, call light in reach and all needs met   Therapy Documentation Precautions:  Precautions Precautions: Fall Precaution Comments: L hemi, incontinent Restrictions Weight Bearing Restrictions: No General:    Therapy/Group: Individual Therapy  Tonny Branch 11/17/2021, 6:52 AM

## 2021-11-17 NOTE — Progress Notes (Signed)
Pharmacy Antibiotic Note  Cassandra Dodson is a 81 y.o. female admitted on 11/05/2021 with UTI.  Pharmacy has been consulted for ceftriaxone dosing. Patient has history of UTI. In 2021, grew ESBL kleb pneumonia. 10/27/2021, patient grew pan sensitive E.Coli that was successfully treated with ceftriaxone/ancef from 7/8 to 7/13. Discussed with provider to re-initiate ceftriaxone for the patient until urine cultures result.   Plan: Ceftriaxone 1g every 24 hours Monitor: WBC (7.2), fever, signs of clinical improvement  Height: '5\' 5"'$  (165.1 cm) Weight: 81 kg (178 lb 9.2 oz) IBW/kg (Calculated) : 57  Temp (24hrs), Avg:98.8 F (37.1 C), Min:97.8 F (36.6 C), Max:100.9 F (38.3 C)  Recent Labs  Lab 11/12/21 0614 11/16/21 1105  WBC 7.9 7.2  CREATININE 1.42* 1.44*    Estimated Creatinine Clearance: 31.7 mL/min (A) (by C-G formula based on SCr of 1.44 mg/dL (H)).    Allergies  Allergen Reactions   Anesthetics, Amide Other (See Comments)    Pt is intolerant to general anesthesia. Pt will throw up and has thrown up during procedure.    Ether Nausea And Vomiting    Antimicrobials this admission: 7/29 Ceftriaxone >>    Microbiology results: 7/8 UCx: pan sensitive E. Coli  7/28 Ucx: pending   Thank you for allowing pharmacy to be a part of this patient's care.  Jeneen Rinks [facility] 11/17/2021 8:44 AM

## 2021-11-17 NOTE — Progress Notes (Signed)
PROGRESS NOTE   Subjective/Complaints: Sleepy this morning Ceftriaxone started du to positive UA and patient's history of ESBL, awaiting UC results  ROS: denies pain, +urinary incontinence  Objective:   DG CHEST PORT 1 VIEW  Result Date: 11/16/2021 CLINICAL DATA:  Cough and recent stroke EXAM: PORTABLE CHEST 1 VIEW COMPARISON:  11/01/2021 FINDINGS: Removal of nasogastric and endotracheal tubes. Internal jugular line has also been removed. Midline trachea. Normal heart size. Atherosclerosis in the transverse aorta. No pleural fluid. Density difference over the upper right chest is favored to represent a skin fold. Minimal scarring at the lung bases. No lobar consolidation. IMPRESSION: No acute cardiopulmonary disease. Aortic Atherosclerosis (ICD10-I70.0). Electronically Signed   By: Abigail Miyamoto M.D.   On: 11/16/2021 11:11   Recent Labs    11/16/21 1105  WBC 7.2  HGB 11.2*  HCT 33.2*  PLT 152     Recent Labs    11/16/21 1105  NA 133*  K 4.4  CL 101  CO2 25  GLUCOSE 194*  BUN 44*  CREATININE 1.44*  CALCIUM 9.0      Intake/Output Summary (Last 24 hours) at 11/17/2021 1124 Last data filed at 11/16/2021 2243 Gross per 24 hour  Intake 600 ml  Output 1000 ml  Net -400 ml     Pressure Injury 11/01/21 Sacrum Mid Stage 1 -  Intact skin with non-blanchable redness of a localized area usually over a bony prominence. (Active)  11/01/21 1750  Location: Sacrum  Location Orientation: Mid  Staging: Stage 1 -  Intact skin with non-blanchable redness of a localized area usually over a bony prominence.  Wound Description (Comments):   Present on Admission: Yes    Physical Exam: Vital Signs Blood pressure (!) 140/43, pulse 78, temperature 97.8 F (36.6 C), resp. rate 15, height '5\' 5"'$  (1.651 m), weight 81 kg, SpO2 96 %. Gen: no distress, normal appearing, has been more somnolent during therapy sessions HEENT: oral mucosa  pink and moist, NCAT Cardio: Reg rate Chest: normal effort, normal rate of breathing Abd: soft, non-distended Ext: no edema Psych: pleasant, normal affect Skin: mild stage 1 on sacrum Neurological:     Comments: slow to awaken and process.  Mild dysarthria.  Oriented to self and place.  Follows simple commands.   HOH. Cranial nerves II through XII intact, motor strength is 5/5 in RIght and 4- Left deltoid, bicep, tricep, grip,5/5 Right 0/5 left side limited by current lethargy Sensory exam normal sensation to light touch and proprioception in bilateral upper and lower extremities Slef feeds with MinA   Assessment/Plan: 1. Functional deficits which require 3+ hours per day of interdisciplinary therapy in a comprehensive inpatient rehab setting. Physiatrist is providing close team supervision and 24 hour management of active medical problems listed below. Physiatrist and rehab team continue to assess barriers to discharge/monitor patient progress toward functional and medical goals  Care Tool:  Bathing    Body parts bathed by patient: Left arm, Face, Chest   Body parts bathed by helper: Right arm, Abdomen, Front perineal area, Buttocks, Right upper leg, Left upper leg, Right lower leg, Left lower leg     Bathing assist Assist Level: Maximal Assistance -  Patient 24 - 49%     Upper Body Dressing/Undressing Upper body dressing   What is the patient wearing?: Pull over shirt    Upper body assist Assist Level: Moderate Assistance - Patient 50 - 74%    Lower Body Dressing/Undressing Lower body dressing      What is the patient wearing?: Pants, Incontinence brief     Lower body assist Assist for lower body dressing: Total Assistance - Patient < 25%     Toileting Toileting    Toileting assist Assist for toileting: Dependent - Patient 0%     Transfers Chair/bed transfer  Transfers assist  Chair/bed transfer activity did not occur: Safety/medical concerns  Chair/bed  transfer assist level: Total Assistance - Patient < 25%     Locomotion Ambulation   Ambulation assist      Assist level: Maximal Assistance - Patient 25 - 49% Assistive device: Parallel bars Max distance: 39f   Walk 10 feet activity   Assist  Walk 10 feet activity did not occur: Safety/medical concerns        Walk 50 feet activity   Assist Walk 50 feet with 2 turns activity did not occur: Safety/medical concerns         Walk 150 feet activity   Assist Walk 150 feet activity did not occur: Safety/medical concerns         Walk 10 feet on uneven surface  activity   Assist Walk 10 feet on uneven surfaces activity did not occur: Safety/medical concerns         Wheelchair     Assist Is the patient using a wheelchair?: Yes Type of Wheelchair: Manual    Wheelchair assist level: Dependent - Patient 0% Max wheelchair distance: 068f   Wheelchair 50 feet with 2 turns activity    Assist        Assist Level: Dependent - Patient 0%   Wheelchair 150 feet activity     Assist      Assist Level: Dependent - Patient 0%   Blood pressure (!) 140/43, pulse 78, temperature 97.8 F (36.6 C), resp. rate 15, height '5\' 5"'$  (1.651 m), weight 81 kg, SpO2 96 %.    Medical Problem List and Plan: 1. 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   Updated son  Decreased therapy to 15/7 given fatigue/limited tolerance 2.  Impaired mobility, working on transfers -Continue Lovenox             -antiplatelet therapy: Aspirin 81 mg daily and Plavix 75 mg day x3 to 6 months 3. Pain Management: Tylenol as  needed 4. Anxiety: continue Xanax 0.25-0.5 mg nightly as needed             -antipsychotic agents: N/A 5. Neuropsych/cognition: This patient is capable of making decisions on her own behalf. 6. Skin/Wound Care: Routine skin checks 7. Fluids/Electrolytes/Nutrition: Routine in and outs with follow-up chemistries 8.  Left frontal large meningioma.  Follow-up neurosurgery Dr. ElEllene Route Plan for possible surgical resection 8-12 weeks.   -per NS decadron tapered to '2mg'$  q12 x  3 days, completes 7/23 9.  Hypertension.  improved, continue Norvasc 10 mg daily, maintain avapro, d/c hydralazine  -improving with steroid taper 10.  Diabetes mellitus.  Hemoglobin A1c 7.0.   -cbg's improving increase Semglee to 37 units daily  -  d/c ISS Well controlled, decrease CBG checks to AC BID -may need further adjustment after steroids dc  CBG (last 3)  Recent Labs    11/16/21 1146 11/16/21 1622 11/17/21 0618  GLUCAP 162* 266* 92      11.  Hyperlipidemia.  Fenofibrate/Crestor 12.  CKD stage III.  Latest creatinine 1.4 on 11/12/21.  Baseline 1.22-1.69.  Monitor weekly 13.  History of endometrial cancer.  Follow-up outpatient 14.  GERD.  continue Protonix 15. Vasovagal episode 7/18: placed teds hose on only with therapy, discussed with therapy 16. Overweight BMI 29.72: provide dietary education 17. 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 This has been discussed with son who is a retired Company secretary and disabled vet 18. Dysphagia: plan for MBS next week 19. Neurogenic bowel and bladder: bladder program 20. Hard of hearing: needs hearing aides, will need to ask son to bring from home.  21. Increased lethargy: Stat CH and routine MRI brain without contrast ordered. Shows small increase in prior stroke, discussed with son and neurology. WBC reviewed and within normal limts.  22. +UA with history of ESBL: ceftriaxone started, f/u UC     LOS: 12  days A FACE TO FACE EVALUATION WAS PERFORMED  Raydel Hosick P Chimamanda Siegfried 11/17/2021, 11:24 AM

## 2021-11-17 NOTE — Progress Notes (Signed)
Physical Therapy Session Note  Patient Details  Name: Cassandra Dodson MRN: [id number] Date of Birth: 1940-09-29  Today's Date: 11/17/2021 PT Individual Time: 1409-1505 PT Individual Time Calculation (min): 56 min   Short Term Goals: Week 2:  PT Short Term Goal 1 (Week 2): Pt will complete bed mobility with modA PT Short Term Goal 2 (Week 2): Pt will complete bed<>chair transfers with modA PT Short Term Goal 3 (Week 2): Pt will propel wheelchair 41f with maxA PT Short Term Goal 4 (Week 2): Pt will demonstrate improved activity tolerance by completing daily therapy sessions  Skilled Therapeutic Interventions/Progress Updates:    Pt received asleep, supine in bed but easily awakens and agreeable to therapy session. Supine>sitting L EOB, HOB partially elevated and using bedrail, with increased time and min assist for L LE management and trunk upright. Sitting EOB with supervision for trunk control, threaded pants on LEs, with total assist - able to lift L LE partially to thread on pants. Sit>stand EOB>B UE support on therapists's shoulders with max assist for lifting to stand and balance due to strong L posterior lean - required 2 stands in order to get pants up over hips dependently. R squat pivot transfer EOB>w/c with heavy mod assist for lifting/pivoting hips - cuing for sequencing and head/hips relationship.   Transported to/from gym in w/c for time management and energy conservation.  Sit>stand w/c>B UE support on // bars with heavy min assist for lifting to stand - pt demos improved midline orientation in // bars, but only tolerates standing ~10-20 seconds due to impaired endurance. In standing, requires min assist and guarding of L knee with multimodal cuing for increased glute/quad activation for knee extension with upright posture.  Gait training in // bars with mod assist of 1 and +2 w/c follow for only 3 steps - noticed L LE adducting during swing phase requiring manual faciltiation to  reposition, blocking L knee buckle during stance with facilitation and multimodal cuing to maintain trunk/hip extension for upright posture (no severe buckling occurred) - facilitation for R/L weight shifting onto stance limb.  Therapist swapped out pt's wheelchair for one of a lower floor-to-seat height to improve pt's safety in the wheelchair and allow her to achieve a more optimal seating position in the chair.  Transported back to room and pt noted to be incontinent. Sit>stand w/c>stedy with +2 min assist to come to stand for safety. Stedy transfer to bathroom. Standing with min progressed to mod assist to maintain standing due to fatigue and limited standing tolerance during dependent assist LB clothing management.  Pt left sitting on BSC over toilet with stedy in place and NT present to assume care of patient.    Therapy Documentation Precautions:  Precautions Precautions: Fall Precaution Comments: L hemi, incontinent Restrictions Weight Bearing Restrictions: No   Pain: No reports of pain throughout session.    Therapy/Group: Individual Therapy  CTawana Scale, PT, DPT, NCS, CSRS 11/17/2021, 12:39 PM

## 2021-11-18 LAB — GLUCOSE, CAPILLARY
Glucose-Capillary: 67 mg/dL — ABNORMAL LOW (ref 70–99)
Glucose-Capillary: 102 mg/dL — ABNORMAL HIGH (ref 70–99)
Glucose-Capillary: 194 mg/dL — ABNORMAL HIGH (ref 70–99)
Glucose-Capillary: 214 mg/dL — ABNORMAL HIGH (ref 70–99)

## 2021-11-18 MED ORDER — INSULIN GLARGINE-YFGN 100 UNIT/ML ~~LOC~~ SOLN
38.0000 [IU] | Freq: Every day | SUBCUTANEOUS | Status: DC
  Administered 2021-11-19 – 2021-11-20 (×2): 38 [IU] via SUBCUTANEOUS
  Filled 2021-11-18 (×3): qty 0.38

## 2021-11-18 NOTE — Progress Notes (Signed)
Physical Therapy Session Note  Patient Details  Name: Cassandra Dodson MRN: [id number] Date of Birth: January 19, 1941  Today's Date: 11/18/2021 PT Individual Time: 0832-0915 PT Individual Time Calculation (min): 43 min   Short Term Goals: Week 2:  PT Short Term Goal 1 (Week 2): Pt will complete bed mobility with modA PT Short Term Goal 2 (Week 2): Pt will complete bed<>chair transfers with modA PT Short Term Goal 3 (Week 2): Pt will propel wheelchair 5f with maxA PT Short Term Goal 4 (Week 2): Pt will demonstrate improved activity tolerance by completing daily therapy sessions   Skilled Therapeutic Interventions/Progress Updates:    Pt presents in bed agreeable to session. Donned Ted hose total assist to prepare for OOB with emphasis on pt managing to help with lifting LE's. Extra time and multimodal cues to come to EOB with CGA with heavy use of bed rails and multiple attempts to scoot to EOB. Assisted with threading of pants but pt assisted some on R with pulling up. UMichaelyn Barterwith mod assist for sit > stand with cues for anterior weightshift and faciliation for upright posture. Total to pull up pants all the way. Tranferred to w/c via Stedy and then changed shirt. Requires assistance with motor planning and technique for doffing and donning new shirt with physical assist for LUE management. Max assist to reposition hips in w/c for appropriate posture and decreased motor planning noted. Attemped w/c mobility x 10' towards door with hemi technique but difficulty with coordination how to go straight/vs turning. Supervision provided while patient drank 4 oz of nectar thick water, no coughing noted. Left up with call bell in reach and all needs in place.   Therapy Documentation Precautions:  Precautions Precautions: Fall Precaution Comments: L hemi, incontinent Restrictions Weight Bearing Restrictions: No  Pain:  No complaints of pain.   Therapy/Group: Individual Therapy  GCanary Brim BIvory Broad PT, DPT, CBIS  11/18/2021, 9:17 AM

## 2021-11-18 NOTE — Progress Notes (Signed)
Physical Therapy Note  Patient Details  Name: Cassandra Dodson MRN: [id number] Date of Birth: 15-Jun-1940 Today's Date: 11/18/2021    Patient in bed asleep upon PT arrival. Patient slow to arouse and difficulty to maintain arousal. Reported she would get up with PT, but at a later time. Stated she was too tired at this time and maintained her eyes closed during statements. Patient declined all skilled interventions offered at this time. Patient missed 45 min of skilled PT due to fatigue, RN made aware. Will attempt to make-up missed time as able.     Lunette Tapp L Machael Raine PT, DPT, NCS, CBIS  11/18/2021, 3:33 PM

## 2021-11-18 NOTE — Progress Notes (Signed)
Rechecked pt's blood sugar and it was back up to 102.

## 2021-11-18 NOTE — Progress Notes (Signed)
PROGRESS NOTE   Subjective/Complaints: Seen working with therapy Williemae Area herself in wheelchair 10 feet but was challenging for her No coughing noted with nectar thick liquids.   ROS: denies pain, +urinary incontinence, denies cough  Objective:   No results found. Recent Labs    11/16/21 1105  WBC 7.2  HGB 11.2*  HCT 33.2*  PLT 152     Recent Labs    11/16/21 1105  NA 133*  K 4.4  CL 101  CO2 25  GLUCOSE 194*  BUN 44*  CREATININE 1.44*  CALCIUM 9.0      Intake/Output Summary (Last 24 hours) at 11/18/2021 1141 Last data filed at 11/18/2021 0805 Gross per 24 hour  Intake 934 ml  Output --  Net 934 ml     Pressure Injury 11/01/21 Sacrum Mid Stage 1 -  Intact skin with non-blanchable redness of a localized area usually over a bony prominence. (Active)  11/01/21 1750  Location: Sacrum  Location Orientation: Mid  Staging: Stage 1 -  Intact skin with non-blanchable redness of a localized area usually over a bony prominence.  Wound Description (Comments):   Present on Admission: Yes    Physical Exam: Vital Signs Blood pressure (!) 146/58, pulse 72, temperature 98.6 F (37 C), resp. rate 18, height '5\' 5"'$  (1.651 m), weight 81 kg, SpO2 98 %. Gen: wheeling wheelchair herself about 10 feet, challenging for her HEENT: oral mucosa pink and moist, NCAT Cardio: Reg rate Chest: normal effort, normal rate of breathing Abd: soft, non-distended Ext: no edema Psych: pleasant, normal affect Skin: mild stage 1 on sacrum Neurological:     Comments: slow to awaken and process.  Mild dysarthria.  Oriented to self and place.  Follows simple commands.   HOH. Cranial nerves II through XII intact, motor strength is 5/5 in RIght and 4- Left deltoid, bicep, tricep, grip,5/5 Right 0/5 left side limited by current lethargy Sensory exam normal sensation to light touch and proprioception in bilateral upper and lower  extremities Slef feeds with MinA   Assessment/Plan: 1. Functional deficits which require 3+ hours per day of interdisciplinary therapy in a comprehensive inpatient rehab setting. Physiatrist is providing close team supervision and 24 hour management of active medical problems listed below. Physiatrist and rehab team continue to assess barriers to discharge/monitor patient progress toward functional and medical goals  Care Tool:  Bathing    Body parts bathed by patient: Left arm, Face, Chest   Body parts bathed by helper: Right arm, Abdomen, Front perineal area, Buttocks, Right upper leg, Left upper leg, Right lower leg, Left lower leg     Bathing assist Assist Level: Maximal Assistance - Patient 24 - 49%     Upper Body Dressing/Undressing Upper body dressing   What is the patient wearing?: Pull over shirt    Upper body assist Assist Level: Moderate Assistance - Patient 50 - 74%    Lower Body Dressing/Undressing Lower body dressing      What is the patient wearing?: Pants, Incontinence brief     Lower body assist Assist for lower body dressing: Total Assistance - Patient < 25%     Chartered loss adjuster  assist Assist for toileting: Dependent - Patient 0%     Transfers Chair/bed transfer  Transfers assist  Chair/bed transfer activity did not occur: Safety/medical concerns  Chair/bed transfer assist level: Dependent - mechanical lift     Locomotion Ambulation   Ambulation assist      Assist level: Maximal Assistance - Patient 25 - 49% Assistive device: Parallel bars Max distance: 39f   Walk 10 feet activity   Assist  Walk 10 feet activity did not occur: Safety/medical concerns        Walk 50 feet activity   Assist Walk 50 feet with 2 turns activity did not occur: Safety/medical concerns         Walk 150 feet activity   Assist Walk 150 feet activity did not occur: Safety/medical concerns         Walk 10 feet on uneven  surface  activity   Assist Walk 10 feet on uneven surfaces activity did not occur: Safety/medical concerns         Wheelchair     Assist Is the patient using a wheelchair?: Yes Type of Wheelchair: Manual    Wheelchair assist level: Total Assistance - Patient < 25% Max wheelchair distance: 10'    Wheelchair 50 feet with 2 turns activity    Assist        Assist Level: Dependent - Patient 0%   Wheelchair 150 feet activity     Assist      Assist Level: Dependent - Patient 0%   Blood pressure (!) 146/58, pulse 72, temperature 98.6 F (37 C), resp. rate 18, height '5\' 5"'$  (1.651 m), weight 81 kg, SpO2 98 %.    Medical Problem List and Plan: 1. 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   Updated son 7/28  Decreased therapy to 15/7 given fatigue/limited tolerance 2.  Impaired mobility, working on transfers -Continue Lovenox             -antiplatelet therapy: Aspirin 81 mg daily and Plavix 75 mg day x3 to 6 months 3. Pain Management: Tylenol as needed 4. Anxiety: continue Xanax 0.25-0.5 mg nightly as needed             -antipsychotic agents: N/A 5. Neuropsych/cognition: This patient is capable of making decisions on her own behalf. 6. Skin/Wound Care: Routine skin checks 7. Fluids/Electrolytes/Nutrition: Routine in and outs with follow-up chemistries 8.  Left frontal large meningioma.  Follow-up neurosurgery Dr. EEllene Route  Plan for possible surgical resection 8-12 weeks.   -per NS decadron tapered to '2mg'$  q12 x 3 days, completes 7/23 9.  Hypertension.  improved, continue Norvasc 10 mg daily, maintain avapro, d/c  hydralazine  -improving with steroid taper 10.  Diabetes mellitus.  Hemoglobin A1c 7.0.   -cbg's improving increase Semglee to 37 units daily  -  d/c ISS Well controlled, decrease CBG checks to AC BID -may need further adjustment after steroids dc  CBG (last 3)  Recent Labs    11/17/21 2058 11/18/21 0553 11/18/21 0630  GLUCAP 224* 67* 102*      11.  Hyperlipidemia.  Fenofibrate/Crestor 12.  CKD stage III.  Latest creatinine 1.4 on 11/12/21.  Baseline 1.22-1.69.  Monitor weekly 13.  History of endometrial cancer.  Follow-up outpatient 14.  GERD.  continue Protonix 15. Vasovagal episode 7/18: placed teds hose on only with therapy, discussed with therapy 16. Overweight BMI 29.72: provide dietary education 17. 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 This has been discussed with son who is a retired Social research officer, government. Discussed plans for menigioma resection in a few weeks with son. 18. Dysphagia: plan for MBS next week 19. Neurogenic bowel and bladder: bladder program 20. Hard of hearing: needs hearing aides, will need to ask son to bring from home.  21. Increased lethargy: improving, Stat CH and routine MRI brain without contrast ordered. Shows small increase in prior stroke, discussed with son and neurology. WBC reviewed and within normal limits but UA was positive, treat UTI as below 22. +UA with history of ESBL: ceftriaxone started, UC with 80,000 gram negative rods, f/u sensitivities     LOS: 13 days A FACE TO FACE EVALUATION WAS PERFORMED  Ziana Heyliger P Samie Barclift 11/18/2021, 11:41 AM

## 2021-11-18 NOTE — Progress Notes (Signed)
Gave pt orange juice due to sugar being 67.  NT will recheck in 15 minutes.

## 2021-11-18 NOTE — Progress Notes (Signed)
Occupational Therapy Session Note  Patient Details  Name: Cassandra Dodson MRN: [id number] Date of Birth: [birth year]  Today's Date: 11/18/2021 OT Individual Time: 1054-1200 OT Individual Time Calculation (min): 66 min    Short Term Goals: Week 1:  OT Short Term Goal 1 (Week 1): Patient will identify need to void and have at least 1 continent episode OT Short Term Goal 1 - Progress (Week 1): Not met OT Short Term Goal 2 (Week 1): Patient will transfer to [facility] with mod assist OT Short Term Goal 2 - Progress (Week 1): Not met OT Short Term Goal 3 (Week 1): Patient will dress upper body with min assist (excluding bra) OT Short Term Goal 3 - Progress (Week 1): Not met OT Short Term Goal 4 (Week 1): Patient will dress lower body with max assist OT Short Term Goal 4 - Progress (Week 1): Not met OT Short Term Goal 5 (Week 1): Patient will bathe upper body with min assist following set up OT Short Term Goal 5 - Progress (Week 1): Not met  Skilled Therapeutic Interventions/Progress Updates:  Pt greeted seated in w/c, pt  agreeable to OT intervention. Pt noted to be playing with a deck of cards on her table. Session focused on various therapeutic activities to work on LUE AROM, visual scanning, LUE FMC:                     - bimanual tasks such as folding wash cloths with BUEs to promote bilateral integration, pt completed task with + time but overall supervision -sorting cards first by color only and then by suit to focus on in hand manipulstion skills and and visual scanning, pt needed MOD verbal cues to remember to incorporate her LUE into task and intermittent MIN A for Cassandra Dodson tasks with LUE -NMR to LUE with pt instructed to slide LUE forward and back on slide board to promote scapular protraction/retraction  Pt agreeable to be transferred out of room, total  A transport to gym for time mgmt.  Utilized BITS to further assess visual scanning,LUE coordination and visual attention   BITS: RUE reaching  39% accuracy, 4.58 sec reaction time, 20 misses decreased attention to L side of screen  LUE 36% accuracy, 7.36 sec reation time, 12 misses, decreased attention to L side, pt preferred to use hand over hand to reach for stimulus d/t decreased AROM in LUE  Bell cancellation test: 7 mins and 20 secs, 9 misses, hit 7 distractor's  Additionally worked on therapeutic activity of grasping/releasing weighted clothespins to improved LUE grip strength and placing pins on mirror positioned in front of pt, worked on returning to upright posture as pt tends to prefer flexed posture. Pt able to manipulate level 1 and 2 pins only, education provided on reaching LUE out fully to place pins as pt prefers to lean trunk forward vs working on full ROM.    Pt transported back to room with total A where pt left seated in w/c with alarm belt activated and all needs within reach.    Therapy Documentation Precautions:  Precautions Precautions: Fall Precaution Comments: L hemi, incontinent Restrictions Weight Bearing Restrictions: No   Pain: no pain reported during session     Therapy/Group: Individual Therapy  Cassandra Dodson 11/18/2021, 12:19 PM

## 2021-11-19 LAB — URINE CULTURE: Culture: 80000 — AB

## 2021-11-19 LAB — GLUCOSE, CAPILLARY
Glucose-Capillary: 103 mg/dL — ABNORMAL HIGH (ref 70–99)
Glucose-Capillary: 194 mg/dL — ABNORMAL HIGH (ref 70–99)
Glucose-Capillary: 247 mg/dL — ABNORMAL HIGH (ref 70–99)
Glucose-Capillary: 255 mg/dL — ABNORMAL HIGH (ref 70–99)

## 2021-11-19 MED ORDER — SODIUM CHLORIDE 0.9 % IV SOLN
2.0000 g | INTRAVENOUS | Status: AC
  Administered 2021-11-19 – 2021-11-25 (×7): 2 g via INTRAVENOUS
  Filled 2021-11-19 (×8): qty 12.5

## 2021-11-19 MED ORDER — SODIUM CHLORIDE 0.9 % IV SOLN: 1.0000 g | INTRAVENOUS | Status: DC

## 2021-11-19 NOTE — Progress Notes (Signed)
PROGRESS NOTE   Subjective/Complaints:  No issues overnite   ROS: denies pain, +urinary incontinence, denies cough  Objective:   No results found. Recent Labs    11/16/21 1105  WBC 7.2  HGB 11.2*  HCT 33.2*  PLT 152      Recent Labs    11/16/21 1105  NA 133*  K 4.4  CL 101  CO2 25  GLUCOSE 194*  BUN 44*  CREATININE 1.44*  CALCIUM 9.0       Intake/Output Summary (Last 24 hours) at 11/19/2021 1031 Last data filed at 11/19/2021 9528 Gross per 24 hour  Intake 692 ml  Output --  Net 692 ml      Pressure Injury 11/01/21 Sacrum Mid Stage 1 -  Intact skin with non-blanchable redness of a localized area usually over a bony prominence. (Active)  11/01/21 1750  Location: Sacrum  Location Orientation: Mid  Staging: Stage 1 -  Intact skin with non-blanchable redness of a localized area usually over a bony prominence.  Wound Description (Comments):   Present on Admission: Yes    Physical Exam: Vital Signs Blood pressure 122/65, pulse 87, temperature 98.3 F (36.8 C), resp. rate 16, height '5\' 5"'$  (1.651 m), weight 81 kg, SpO2 98 %.  General: No acute distress Mood and affect are appropriate Heart: Regular rate and rhythm no rubs murmurs or extra sounds Lungs: Clear to auscultation, breathing unlabored, no rales or wheezes Abdomen: Positive bowel sounds, soft nontender to palpation, nondistended Extremities: No clubbing, cyanosis, or edema  Skin: mild stage 1 on sacrum Neurological:     Comments: slow to awaken and process.  Mild dysarthria.  Oriented to self and place.  Follows simple commands.   HOH. Cranial nerves II through XII intact, motor strength is 5/5 in RIght and 4- Left deltoid, bicep, tricep, grip,5/5 Right 0/5 left side limited by current lethargy Sensory exam normal sensation to light touch and proprioception in bilateral upper and lower extremities Slef feeds with  MinA   Assessment/Plan: 1. Functional deficits which require 3+ hours per day of interdisciplinary therapy in a comprehensive inpatient rehab setting. Physiatrist is providing close team supervision and 24 hour management of active medical problems listed below. Physiatrist and rehab team continue to assess barriers to discharge/monitor patient progress toward functional and medical goals  Care Tool:  Bathing    Body parts bathed by patient: Left arm, Face, Chest   Body parts bathed by helper: Right arm, Abdomen, Front perineal area, Buttocks, Right upper leg, Left upper leg, Right lower leg, Left lower leg     Bathing assist Assist Level: Maximal Assistance - Patient 24 - 49%     Upper Body Dressing/Undressing Upper body dressing   What is the patient wearing?: Pull over shirt    Upper body assist Assist Level: Moderate Assistance - Patient 50 - 74%    Lower Body Dressing/Undressing Lower body dressing      What is the patient wearing?: Pants, Incontinence brief     Lower body assist Assist for lower body dressing: Total Assistance - Patient < 25%     Toileting Toileting    Toileting assist Assist for toileting: Dependent -  Patient 0%     Transfers Chair/bed transfer  Transfers assist  Chair/bed transfer activity did not occur: Safety/medical concerns  Chair/bed transfer assist level: Dependent - mechanical lift     Locomotion Ambulation   Ambulation assist      Assist level: Maximal Assistance - Patient 25 - 49% Assistive device: Parallel bars Max distance: 49f   Walk 10 feet activity   Assist  Walk 10 feet activity did not occur: Safety/medical concerns        Walk 50 feet activity   Assist Walk 50 feet with 2 turns activity did not occur: Safety/medical concerns         Walk 150 feet activity   Assist Walk 150 feet activity did not occur: Safety/medical concerns         Walk 10 feet on uneven surface  activity   Assist  Walk 10 feet on uneven surfaces activity did not occur: Safety/medical concerns         Wheelchair     Assist Is the patient using a wheelchair?: Yes Type of Wheelchair: Manual    Wheelchair assist level: Total Assistance - Patient < 25% Max wheelchair distance: 10'    Wheelchair 50 feet with 2 turns activity    Assist        Assist Level: Dependent - Patient 0%   Wheelchair 150 feet activity     Assist      Assist Level: Dependent - Patient 0%   Blood pressure 122/65, pulse 87, temperature 98.3 F (36.8 C), resp. rate 16, height '5\' 5"'$  (1.651 m), weight 81 kg, SpO2 98 %.    Medical Problem List and Plan: 1. 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  Impaired mobility, working on transfers -Continue Lovenox             -antiplatelet therapy: Aspirin 81 mg daily and Plavix 75 mg day x3 to 6 months 3. Pain Management: Tylenol as needed 4. Anxiety: continue Xanax 0.25-0.5 mg nightly as needed             -antipsychotic agents: N/A 5. Neuropsych/cognition: This patient is capable of making decisions on her own behalf. 6. Skin/Wound Care: Routine skin checks 7. Fluids/Electrolytes/Nutrition: Routine in and outs with follow-up chemistries 8.  Left frontal large meningioma.  Follow-up neurosurgery Dr. EEllene Route  Plan for possible surgical resection 8-12 weeks.   -per NS decadron tapered to '2mg'$  q12 x 3 days, completes 7/23 9.  Hypertension.  improved, continue Norvasc 10 mg daily, maintain avapro, d/c hydralazine  -improving with steroid taper 10.  Diabetes mellitus.  Hemoglobin A1c 7.0.   -cbg's improving increase Semglee  to 37 units daily  -  d/c ISS Well controlled, decrease CBG checks to AC BID -may need further adjustment after steroids dc  CBG (last 3)  Recent Labs    11/18/21 1202 11/18/21 1638 11/19/21 0602  GLUCAP 194* 214* 103*      Pm CBG elevated yesterday  11.  Hyperlipidemia.  Fenofibrate/Crestor 12.  CKD stage III.  Latest creatinine 1.4 on 11/12/21.  Baseline 1.22-1.69.  Monitor weekly 13.  History of endometrial cancer.  Follow-up outpatient 14.  GERD.  continue Protonix 15. Vasovagal episode 7/18: placed teds hose on only with therapy, discussed with therapy 16. Overweight BMI 29.72: provide dietary education 17. 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 This has been discussed with son who is a retired Social research officer, government. Discussed plans for menigioma resection in a few weeks with son. 18. Dysphagia: plan for MBS next week 19. Neurogenic bowel and bladder: bladder program 20. Hard of hearing: needs hearing aides, will need to ask son to bring from home.  21. Increased lethargy: improving, Stat CH and routine MRI brain without contrast ordered. Shows small increase in prior stroke, discussed with son and neurology. WBC reviewed and within normal limits but UA was positive, treat UTI as below 22. +UA with history of ESBL no dysuria or fever : ceftriaxone started, UC with 80,000 gram negative rods, f/u sensitivities- Ceftriaxone not tested S to Cefepime will switch   LOS: 14 days A FACE TO FACE EVALUATION WAS PERFORMED  Charlett Blake 11/19/2021, 10:31 AM

## 2021-11-19 NOTE — Progress Notes (Signed)
Speech Language Pathology Daily Session Note  Patient Details  Name: Cassandra Dodson MRN: [id number] Date of Birth: 03-18-1941  Today's Date: 11/19/2021 SLP Individual Time: 1696-7893 SLP Individual Time Calculation (min): 45 min  Short Term Goals: Week 2: SLP Short Term Goal 1 (Week 2): Patient will consume current diet with minimal overt s/sx of aspiration and with Mod A multimodal cues to implement small + single bites/sip strategy during PO intake. - Did not formally address. Per chart review, pt observed during self-feeding session with OT and no s/sx concerning for aspiration were noted with current diet textures.   SLP Short Term Goal 2 (Week 2): Pt will participate in MBSS to further determine least restrictive diet with 100% completion. - Following Total A for oral care for thoroughness, pt accepted PO trials of ice chips and 1/2 tsp of thin water with only one instance of coughing. Vocal quality remained dry and clear. If alertness remains stable, will plan for MBSS on 11/21/2021.  SLP Short Term Goal 3 (Week 2): Pt will demonstrate orientation x 4 with Mod A across 2 sessions. - Pt demonstrated orientation to self, location, and situation with Mod I; date with Sup A.  SLP Short Term Goal 4 (Week 2): Patient will improve intellectual awareness by identifying at least 2 changes (e.g., cognitive, speech, swallowing, physical) with max A verbal and contextual cues - Pt verbalized 2 changes s/p CVA with Sup A question prompts.  SLP Short Term Goal 5 (Week 2): Patient will demonstrate functional recall by using internal/external memory strategies/aids with max A verbal/visual cues. - Pt demonstrated functional recall of performing effortful swallow during therapeutic PO trials and upcoming MBSS given Sup A question prompts.  Skilled Therapeutic Interventions: S: Pt seen this date for skilled ST intervention targeting deglutition and cognitive-linguistic goals outlined above. Pt received  awake/alert and lying in bed. Recently finished with OT and returned to bed. No c/o pain. Agreeable to ST intervention at bedside. Of note, no family present for family education planned for today.  O: Please see above for objective data re: pt's performance on targeted goals. Completed functional sustained attention task of folding washcloths, with Sup A faded to Mod I, as the task progressed. Pt utilized b/l UE to complete task with initial verbal cues. Benefited from Mod-Max A for organizing/sequencing playing cards from lowest to highest in value. Informally, pt with improved carryover of orientation information, intellectual awareness, and functional recall in vary structured situations with mass practice this session, though pt does fluctuate frequently. Re: deglutition, pt tolerated controlled PO trials of ice chips and 1/2 tsp sips of thin water with only one instance of coughing with ice chips, which appeared to be r/t inattention and mistiming. No additional s/sx concerning for airway invasion noted across trials with vocal quality remaining dry and clear b/w trials. SpO2 and HR measurements taken after PO trials with SpO2 reading 99% and HR 69 - all WNL.  A: Pt appears somewhat stimulable for skilled ST interventions, this date, as evident by less cueing required for orientation to location, situation, and date, intellectual awareness of 2 deficits, and recall of functional information with use of errorless learning, spaced retrieval, and mass practice. Continue to tolerate controlled trials of ice chips and thin water with minimal s/sx concerning for airway invasion. Tolerating current diet, per review of vitals, I/O, and MD notes. Will plan to complete MBSS on 11/21/2021.   P: Pt left in bed with all safety measures activated. Call bell reviewed  and within reach and all immediate needs met. Continue with current ST POC next session.   Pain None/Denies  Therapy/Group: Individual  Therapy  Keith Felten A Truth Wolaver 11/19/2021, 1:28 PM

## 2021-11-19 NOTE — Progress Notes (Signed)
Physical Therapy Session Note  Patient Details  Name: Cassandra Dodson MRN: [id number] Date of Birth: [birth year]  10:30 - 11:15 Total treatment time: 45 minutes  Short Term Goals: Week 2:  PT Short Term Goal 1 (Week 2): Pt will complete bed mobility with modA PT Short Term Goal 2 (Week 2): Pt will complete bed<>chair transfers with modA PT Short Term Goal 3 (Week 2): Pt will propel wheelchair 24f with maxA PT Short Term Goal 4 (Week 2): Pt will demonstrate improved activity tolerance by completing daily therapy sessions  Skilled Therapeutic Interventions/Progress Updates:      Pt supine in bed to start, denies pain. LPN in room administering Rx - no coughing noted with applesauce or sips of nectar thickened cranberry juice. MD then entering room for morning rounding and assessment - pt hard of hearing and cognitive deficits impacting understanding. No family present in room for scheduled family training/education.   Pt noted to be incontinent of bowel and bladder, pt unaware with soiled linen and brief. Brief change at bed level. Rolling multiple times both directions, rolling better to her R side but struggles with achieving full sidelying position and unable to maintain for >30 seconds at a time. minA for rolling R and modA for rolling L. Dependant assist for pericare.   Supine<>sitting EOB with modA with instructional cues and ++ time allotted for problem solving and motor planning. MinA for static sitting EOB due to L trunk lean. Stedy transfer to w/c with modA for standing to rise in [place]  Transported in w/c to day room rehab gym. Squat<>pivot transfer with maxA from w/c to Nustep - maxA for posterior scooting to reposition on Nustep. SetupA required for BLE on paddles - completed for 4.5 minutes at L5 using BUE/BLE to target cardiovascular endurance and general strengthening. Pt asking after 30 seconds if she can stop and asked periodically during activity - redirected to complete. Pt  complaining of buttock pain during this activity - suspect sacral wound. LPN notified via secure chat.  Squat<>pivot to return to w/c with maxA and repositioned in w/c to promote upright. Returned to room and she remained sitting in w/c with safety belt alarm on, call bell in lap. All needs in reach. NT notified after session of pt position and suggested frequently checking on her to make sure she's safely sitting in chair.   Therapy Documentation Precautions:  Precautions Precautions: Fall Precaution Comments: L hemi, incontinent Restrictions Weight Bearing Restrictions: No General:     Therapy/Group: Individual Therapy  [name]7/31/2023, 7:29 AM

## 2021-11-19 NOTE — Progress Notes (Signed)
PHARMACY NOTE:  ANTIMICROBIAL RENAL DOSAGE ADJUSTMENT  Current antimicrobial regimen includes a mismatch between antimicrobial dosage and estimated renal function.  As per policy approved by the Pharmacy & Therapeutics and Medical Executive Committees, the antimicrobial dosage will be adjusted accordingly.  Current antimicrobial dosage:  Cefepime 1g IV q24h ordered to start this AM 11/19/21  Indication: UTI (urine culture grew pseudomonas aeruginosa) without sepsis, with history of ESBL no dysuria or fever.   Renal Function:  Estimated Creatinine Clearance: 31.7 mL/min (A) (by C-G formula based on SCr of 1.44 mg/dL (H)). '[]'$      On intermittent HD, scheduled: '[]'$      On CRRT    Antimicrobial dosage has been changed to:  Cefepime 2 g IV q24h for UTI without sepsis and CrCl >30 ml/min.  (30-50 ml/min)  Additional comments:   Thank you for allowing pharmacy to be a part of this patient's care.  Nicole Cella, RPh Clinical Pharmacist [phone] 11/19/2021 11:02 AM  Please check AMION for all [place] phone numbers after 4PM After 10:00 PM, call [place] [phone]

## 2021-11-19 NOTE — Progress Notes (Signed)
Occupational Therapy Session Note  Patient Details  Name: Cassandra Dodson MRN: [id number] Date of Birth: [birth year]  Today's Date: 11/19/2021 OT Individual Time: 1324-4010 OT Individual Time Calculation (min): 42 min    Short Term Goals: Week 2:  OT Short Term Goal 1 (Week 2): Patient will bathe upper body with min assist following set up OT Short Term Goal 2 (Week 2): Patient will don pull over shirt with min assist OT Short Term Goal 3 (Week 2): Patient will trasnfer to/from commode with mod assist OT Short Term Goal 4 (Week 2): Patient will pull pants up/down over hips with mod assist OT Short Term Goal 5 (Week 2): Patient will visualy scan to left to locate needed hygiene or food items  Skilled Therapeutic Interventions/Progress Updates:    Patient received supine in bed - awake and alert.  Patient recalled this therapist's name!   Patient able to transition to sitting at edge of bed with mod assist.  Patient able to maintain static sitting balance x several minutes without loss of balance.  Patient able to reach forward without loss of balance to aide with donning pants.  Patient does not yet have sufficient postural control and balance to reach to feet, but can now kick feet forward without significant loss of balance backward.   Patient able to transition to standing, but unable to stand erectly - stands with left hip and knee flexion and trunk flexion.  Patient able to identify fear in standing when asked.  "I don't have anything to hold onto."  Patient able to participate throughout this session.   Patient asking for drink - offered ~ 4oz nectar thick cranberry juice.  Patient left in bed with HOB up and call bell within reach.  Bed alarm set.    Therapy Documentation Precautions:  Precautions Precautions: Fall Precaution Comments: L hemi, incontinent Restrictions Weight Bearing Restrictions: No   Pain:  Denies pain      Therapy/Group: Individual Therapy  Mariah Milling 11/19/2021, 12:32 PM

## 2021-11-20 DIAGNOSIS — N3000 Acute cystitis without hematuria: Secondary | ICD-10-CM

## 2021-11-20 DIAGNOSIS — Z794 Long term (current) use of insulin: Secondary | ICD-10-CM

## 2021-11-20 LAB — GLUCOSE, CAPILLARY
Glucose-Capillary: 109 mg/dL — ABNORMAL HIGH (ref 70–99)
Glucose-Capillary: 272 mg/dL — ABNORMAL HIGH (ref 70–99)

## 2021-11-20 MED ORDER — ORAL CARE MOUTH RINSE
15.0000 mL | OROMUCOSAL | Status: DC
  Administered 2021-11-20 – 2021-11-27 (×27): 15 mL via OROMUCOSAL

## 2021-11-20 MED ORDER — ORAL CARE MOUTH RINSE
15.0000 mL | OROMUCOSAL | Status: DC | PRN
  Administered 2021-11-24: 15 mL via OROMUCOSAL

## 2021-11-20 NOTE — Progress Notes (Signed)
Physical Therapy Session Note  Patient Details  Name: Cassandra Dodson MRN: [id number] Date of Birth: 1940/10/26  Today's Date: 11/20/2021 PT Individual Time: 0816-0912 PT Individual Time Calculation (min): 56 min   Short Term Goals: Week 2:  PT Short Term Goal 1 (Week 2): Pt will complete bed mobility with modA PT Short Term Goal 2 (Week 2): Pt will complete bed<>chair transfers with modA PT Short Term Goal 3 (Week 2): Pt will propel wheelchair 69f with maxA PT Short Term Goal 4 (Week 2): Pt will demonstrate improved activity tolerance by completing daily therapy sessions  Skilled Therapeutic Interventions/Progress Updates:     Direct handoff of care to start from OT with patient sitting in standard w/c. Pt finished eating breakfast with OT assist, pt appearing more alert and lucid compared to prior sessions but this deteriorated as she became fatigued during session. She denies pain, remains to have flat affect with delayed processing and initiation.   [facility] for increasing stimulation and continue adequate alertness. Wheeled facing fencing near garden bed to work on sit<>stands outdoors. She completed a total of 1x5 sit<>stands varying in assist level from mod to max/totaA. Standing balance also varied b/w modA to maxA with standing tolerance for 45-60 seconds. Pt with extended rest breaks b/w efforts for recovery. On her last stand, she was unable to power to rise with her BLE collapsing underneath her. She required dependant/totalA for safely repositioning in the wheelchair due to concern of sliding out. Pt unable to initiate correction despite max multi modal cueing. She continued to sit in sacral sitting while she was transported back upstairs to CIR floor.  Switched out her manual w/c for TIS w/c due to safety concerns for falling as well as to assist with pressure relief due to sacral wounds. ROHO cushion remained for pressure relief as well.  Pt completing squat<>pivot transfer from manual w/c<>mat table<> TIS chair with mod/maxA. TIS w/c adjusted to fit. Pt placed [place] to improve stimulation, engagement, and alertness for upcoming SLP session. LPN notified after session. Standard wheelchair left in room for future use.    Therapy Documentation Precautions:  Precautions Precautions: Fall Precaution Comments: L hemi, incontinent Restrictions Weight Bearing Restrictions: (P) No General:    Therapy/Group: Individual Therapy  Sherrise Liberto P Nichlos Kunzler PT 11/20/2021, 9:12 AM

## 2021-11-20 NOTE — Inpatient Diabetes Management (Signed)
Inpatient Diabetes Program Recommendations  AACE/ADA: New Consensus Statement on Inpatient Glycemic Control (2015)  Target Ranges:  Prepandial:   less than 140 mg/dL      Peak postprandial:   less than 180 mg/dL (1-2 hours)      Critically ill patients:  140 - 180 mg/dL   Lab Results  Component Value Date   GLUCAP 109 (H) 11/20/2021   HGBA1C 7.0 (H) 10/26/2021    Review of Glycemic Control  Latest Reference Range & Units 11/19/21 06:02 11/19/21 11:27 11/19/21 16:05 11/19/21 21:32 11/20/21 06:35  Glucose-Capillary 70 - 99 mg/dL 103 (H) 194 (H) 247 (H) 255 (H) 109 (H)  (H): Data is abnormally high  Diabetes history: DM2 Outpatient Diabetes medications: 75/25 30 units am, 30 units pm Current orders for Inpatient glycemic control: Semglee 38 units qd  Inpatient Diabetes Program Recommendations:   Please consider: -Add Novolog 0-6 units tid correction -May need to add Novolog small amount meal coverage if PP are consistently elevated, but currently variable.  Thank you, Nani Gasser. Darrel Gloss, RN, MSN, CDE  Diabetes Coordinator Inpatient Glycemic Control Team Team Pager [phone] (8am-5pm) 11/20/2021 2:11 PM

## 2021-11-20 NOTE — Progress Notes (Signed)
Occupational Therapy Weekly Progress Note  Patient Details  Name: Cassandra Dodson MRN: [id number] Date of Birth: 1940-04-28  Beginning of progress report period: November 13, 2021 End of progress report period: November 20, 2021  Today's Date: 11/20/2021 OT Individual Time: 5597-4163 OT Individual Time Calculation (min): 45 min    Patient has met 1 of 4 short term goals.  Patient has shown some improvement in her ability to attend to task, but this fluctuates. Patient also with limited activity tolerance.  Seems better able to participate with less therapy over entire week.    Patient continues to demonstrate the following deficits: muscle weakness, decreased cardiorespiratoy endurance, abnormal tone, unbalanced muscle activation, decreased coordination, and decreased motor planning, decreased midline orientation, decreased attention to left, and decreased motor planning, decreased initiation, decreased attention, decreased awareness, decreased problem solving, decreased safety awareness, decreased memory, and delayed processing, and decreased sitting balance, decreased standing balance, decreased postural control, hemiplegia, and decreased balance strategies and therefore will continue to benefit from skilled OT intervention to enhance overall performance with BADL.  Patient  has shown some improvement in attention, but inconsistent . When she is attending to task - she has better / safer participation Continue plan of care.  OT Short Term Goals Week 2:  OT Short Term Goal 1 (Week 2): Patient will bathe upper body with min assist following set up OT Short Term Goal 1 - Progress (Week 2): Partly met (Not yet consistent) OT Short Term Goal 2 (Week 2): Patient will don pull over shirt with min assist OT Short Term Goal 2 - Progress (Week 2): Met OT Short Term Goal 3 (Week 2): Patient will trasnfer to/from commode with mod assist OT Short Term Goal 3 - Progress (Week 2): Met OT Short Term Goal 4 (Week 2):  Patient will pull pants up/down over hips with mod assist OT Short Term Goal 4 - Progress (Week 2): Not met OT Short Term Goal 5 (Week 2): Patient will visualy scan to left to locate needed hygiene or food items OT Short Term Goal 5 - Progress (Week 2): Met Week 3:  OT Short Term Goal 1 (Week 3): Patient will dress lower body with mod assist OT Short Term Goal 2 (Week 3): Patient will feed herself with supervision and verbal cueing OT Short Term Goal 3 (Week 3): Patient will sit without support x 2 min in preparation for a level surface transfer OT Short Term Goal 4 (Week 3): Patient will transfer to commode with mod assist 3/5 trials OT Short Term Goal 5 (Week 3): Patient will actively attend to familiar functional task in quiet environment x 5 min  Skilled Therapeutic Interventions/Progress Updates:    Patient received supine in bed - sleeping - but did wake pretty easily.  Patient indicating hunger.  Patient with soaked brief - assisted her to wash and change into clean brief and pants.  Patient able to roll self left and right with verbal cueing / contact guard assist.  Able to transition to sitting with encouragement and contact guard.   Able to sit at edge of bed without physical assist - with close supervision.  Patient remained alert, remembering therapist's name, and that she had been scheduled for a swallowing study today.   Patient able to use walker and stand pivot transfer to wheelchair, then sit upright and feed herself breakfast with min assist and cueing to alternate liquids and swallows and put fork down in between bites to clear food.  Direct hand off to PT so patient left up in wheelchair with gait belt across front to enhance hip position.    Therapy Documentation Precautions:  Precautions Precautions: Fall Precaution Comments: L hemi, incontinent Restrictions Weight Bearing Restrictions: No  Pain: Pain Assessment Pain Scale: 0-10 Pain Score: 0-No  pain     Therapy/Group: Individual Therapy  Mariah Milling 11/20/2021, 12:19 PM

## 2021-11-20 NOTE — Progress Notes (Signed)
Patient ID: Cassandra Dodson, female   DOB: 16-Apr-1941, 81 y.o.   MRN: [id number]  Spoke briefly with son-Fitch he is home sick and was not able to make it yesterday will tentatively re-schedule for Monday to allow him to get well. Will update tomorrow after team conference and check to see how he is doing.

## 2021-11-20 NOTE — Progress Notes (Signed)
PROGRESS NOTE   Subjective/Complaints:  No new concerns elicited. She is eating breakfast this AM. Working with therapy. No pain reported.   LBM today  ROS: denies pain, +urinary incontinence, denies cough, no CP or SOB  Objective:   No results found. No results for input(s): "WBC", "HGB", "HCT", "PLT" in the last 72 hours.    No results for input(s): "NA", "K", "CL", "CO2", "GLUCOSE", "BUN", "CREATININE", "CALCIUM" in the last 72 hours.     Intake/Output Summary (Last 24 hours) at 11/20/2021 0746 Last data filed at 11/19/2021 2300 Gross per 24 hour  Intake 954 ml  Output 0 ml  Net 954 ml      Pressure Injury 11/01/21 Sacrum Mid Stage 1 -  Intact skin with non-blanchable redness of a localized area usually over a bony prominence. (Active)  11/01/21 1750  Location: Sacrum  Location Orientation: Mid  Staging: Stage 1 -  Intact skin with non-blanchable redness of a localized area usually over a bony prominence.  Wound Description (Comments):   Present on Admission: Yes    Physical Exam: Vital Signs Blood pressure (!) 151/63, pulse 89, temperature 98.8 F (37.1 C), temperature source Oral, resp. rate 16, height '5\' 5"'$  (1.651 m), weight 78.1 kg, SpO2 100 %.  General: No acute distress, sitting in chair eating breakfast Mood and affect are appropriate Heart: Regular rate and rhythm no rubs murmurs or extra sounds Lungs: Clear to auscultation, breathing unlabored, no rales or wheezes Abdomen: Positive bowel sounds, soft nontender to palpation, nondistended Extremities: No clubbing, cyanosis, or edema  Skin: mild stage 1 on sacrum Neurological:     Comments: Alert, slow to process.  Mild dysarthria.  Oriented to self and place.  Follows simple commands.   HOH. Cranial nerves II through XII intact, motor strength is 5/5 in RIght and 4- Left deltoid, bicep, tricep, grip,5/5 Right 0/5 left side limited by current  lethargy Sensory exam normal sensation to light touch and proprioception in bilateral upper and lower extremities Self feeds with MinA   Assessment/Plan: 1. Functional deficits which require 3+ hours per day of interdisciplinary therapy in a comprehensive inpatient rehab setting. Physiatrist is providing close team supervision and 24 hour management of active medical problems listed below. Physiatrist and rehab team continue to assess barriers to discharge/monitor patient progress toward functional and medical goals  Care Tool:  Bathing    Body parts bathed by patient: Left arm, Face, Chest   Body parts bathed by helper: Right arm, Abdomen, Front perineal area, Buttocks, Right upper leg, Left upper leg, Right lower leg, Left lower leg     Bathing assist Assist Level: Maximal Assistance - Patient 24 - 49%     Upper Body Dressing/Undressing Upper body dressing   What is the patient wearing?: Pull over shirt    Upper body assist Assist Level: Moderate Assistance - Patient 50 - 74%    Lower Body Dressing/Undressing Lower body dressing      What is the patient wearing?: Pants, Incontinence brief     Lower body assist Assist for lower body dressing: Total Assistance - Patient < 25%     Toileting Toileting    Toileting assist  Assist for toileting: Dependent - Patient 0%     Transfers Chair/bed transfer  Transfers assist  Chair/bed transfer activity did not occur: Safety/medical concerns  Chair/bed transfer assist level: Dependent - mechanical lift     Locomotion Ambulation   Ambulation assist      Assist level: Maximal Assistance - Patient 25 - 49% Assistive device: Parallel bars Max distance: 64f   Walk 10 feet activity   Assist  Walk 10 feet activity did not occur: Safety/medical concerns        Walk 50 feet activity   Assist Walk 50 feet with 2 turns activity did not occur: Safety/medical concerns         Walk 150 feet  activity   Assist Walk 150 feet activity did not occur: Safety/medical concerns         Walk 10 feet on uneven surface  activity   Assist Walk 10 feet on uneven surfaces activity did not occur: Safety/medical concerns         Wheelchair     Assist Is the patient using a wheelchair?: Yes Type of Wheelchair: Manual    Wheelchair assist level: Total Assistance - Patient < 25% Max wheelchair distance: 10'    Wheelchair 50 feet with 2 turns activity    Assist        Assist Level: Dependent - Patient 0%   Wheelchair 150 feet activity     Assist      Assist Level: Dependent - Patient 0%   Blood pressure (!) 151/63, pulse 89, temperature 98.8 F (37.1 C), temperature source Oral, resp. rate 16, height '5\' 5"'$  (1.651 m), weight 78.1 kg, SpO2 100 %.    Medical Problem List and Plan: 1. 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  Impaired mobility, working on transfers -Continue Lovenox             -antiplatelet therapy: Aspirin 81 mg daily and Plavix 75 mg day x3 to 6 months 3. Pain Management: Tylenol as needed 4. Anxiety: continue Xanax 0.25-0.5 mg nightly as needed             -antipsychotic agents: N/A 5. Neuropsych/cognition: This patient is capable of making decisions on her own behalf. 6. Skin/Wound Care: Routine skin checks 7. Fluids/Electrolytes/Nutrition: Routine in and outs with follow-up chemistries 8.  Left frontal large meningioma.  Follow-up neurosurgery Dr. EEllene Route  Plan for possible surgical resection 8-12 weeks.   -per NS decadron tapered to '2mg'$  q12 x 3 days, completes 7/23 9.  Hypertension.   improved, continue Norvasc 10 mg daily, maintain avapro, d/c hydralazine  -improving with steroid taper  -Fair control, Continue to monitor trend 10.  Diabetes mellitus.  Hemoglobin A1c 7.0.   -cbg's improving increase Semglee to 37 units daily  -  d/c ISS Well controlled, decrease CBG checks to AC BID -may need further adjustment after steroids dc  CBG (last 3)  Recent Labs    11/19/21 1605 11/19/21 2132 11/20/21 0635  GLUCAP 247* 255* 109*     8/1 Labile CBG, will consult DM  coordinator 11.  Hyperlipidemia.  Fenofibrate/Crestor 12.  CKD stage III.  Latest creatinine 1.4 on 11/12/21.  Baseline 1.22-1.69.  Monitor weekly 13.  History of endometrial cancer.  Follow-up outpatient 14.  GERD.  continue Protonix 15. Vasovagal episode 7/18: placed teds hose on only with therapy, discussed with therapy 16. Overweight BMI 29.72: provide dietary education 17. 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 This has been discussed with son who is a retired Social research officer, government. Discussed plans for menigioma resection in a few weeks with son. 18. Dysphagia: plan for MBS next week 19. Neurogenic bowel and bladder: bladder program 20. Hard of hearing: needs hearing aides, will need to ask son to bring from home.  21. Increased lethargy: improving, Stat CH and routine MRI brain without contrast ordered. Shows small increase in prior stroke, discussed with son and neurology. WBC reviewed and within normal limits but UA was positive, treat UTI as below 22. +UA with history of ESBL no dysuria or fever : ceftriaxone started, UC with 80,000 gram negative rods, f/u sensitivities- Ceftriaxone not tested S to Cefepime will switch, UCX with pseudomonas  -Denies dysuria today, continue cefepime   LOS: 15 days A FACE TO FACE EVALUATION WAS PERFORMED  Jennye Boroughs 11/20/2021, 7:46 AM

## 2021-11-20 NOTE — Progress Notes (Signed)
Speech Language Pathology Weekly Progress and Session Note  Patient Details  Name: Cassandra Dodson MRN: [id number] Date of Birth: 1940-09-28  Beginning of progress report period: October 14, 2021 End of progress report period: November 20, 2021  Today's Date: 11/20/2021 SLP Individual Time: 1105-1200 Total Time: 55 minutes    Short Term Goals: Week 2: SLP Short Term Goal 1 (Week 2): Patient will consume current diet with minimal overt s/sx of aspiration and with Mod A multimodal cues to implement small + single bites/sip strategy during PO intake. SLP Short Term Goal 1 - Progress (Week 2): Met SLP Short Term Goal 2 (Week 2): Pt will participate in MBSS to further determine least restrictive diet with 100% completion. SLP Short Term Goal 2 - Progress (Week 2): Not met SLP Short Term Goal 3 (Week 2): Pt will demonstrate orientation x 4 with Mod A across 2 sessions. SLP Short Term Goal 3 - Progress (Week 2): Met SLP Short Term Goal 4 (Week 2): Patient will improve intellectual awareness by identifying at least 2 changes (e.g., cognitive, speech, swallowing, physical) with max A verbal and contextual cues SLP Short Term Goal 4 - Progress (Week 2): Met SLP Short Term Goal 5 (Week 2): Patient will demonstrate functional recall by using internal/external memory strategies/aids with max A verbal/visual cues SLP Short Term Goal 5 - Progress (Week 2): Met    New Short Term Goals: Week 3: SLP Short Term Goal 1 (Week 3): STG's = LTG's due to ELOS (8/8)  Weekly Progress Updates: Pt has demonstrated functional gains, as evident by meeting 4 out of 5 short-term goals, this reporting period. Pt education ongoing re: ST POC, deficits s/p CVA, and orientation. Pt requires Sup A to Min A for orientation to location and date; Mod I for situation vs Max A last reporting period. Continues to benefit from errorless learning and spaced retrieval + Sup A verbal cues to recall functional information re: care. Benefits from  question prompts for spontaneous initiation of verbal communication. Tolerating thin water trials with minimal s/sx concerning for airway invasion; appears appropriate for repeat MBSS; therefore, will plan to proceed with instrumental assessment next date. Family education pending; pt's son did not attend scheduled family training on 7/31. Continue to recommend 24/7 supervision and assistance at time of discharge, as well as ST f/u.    Intensity: Minumum of 1-2 x/day, 30 to 90 minutes Frequency: 3 to 5 out of 7 days Duration/Length of Stay: ELOS 8/8 Treatment/Interventions: Cognitive remediation/compensation;Dysphagia/aspiration precaution training;Functional tasks;Patient/family education;Speech/Language facilitation;Therapeutic Activities   Daily Session  Skilled Therapeutic Interventions:  S: Pt seen this date for skilled ST intervention targeting deglutition and cognitive-linguistic goals outlined above. Pt received awake/alert in TIS at nurses' station. No c/o pain. Agreeable to ST intervention in hospital room with encouragement and offering water trials.  O: SLP facilitated today's session by providing Max A for oral care via suction toothbrush for thoroughness and administration of controlled PO trials of ice chips and tsp sips of thin water via spoon and small sips via cup. Cough noted x 1 with ice chip trial likely due to inattention of bolus and mistiming of swallow initiation and apneic period. No clinical s/sx concerning for airway invasion; vocal quality remained clear and dry b/w trials with no coughing nor throat clearing. Swallow initiation appeared relatively timely and brisk. Mod A faded to Sup A for self-administering small sips via cup. Completed card game for 10 minutes with Min-Mod A verbal and visual cues for  recall of rules, maintaining attention to task, and attending to L field. Oriented to location and situation given Sup A; date given Min A. Recalled upcoming MBSS next date  and small sips with Sup A questions cues with use of errorless learning and spaced retrieval interventions. Min A to utilize external aids to re-orient and recall functional information. Increased instances of spontaneous speech this date. Subjectively, appeared more comfortable in TIS vs standard w/c. When asked if pt needed to toilet, she initially said "no," and then changed her mind and said "yes." Pt transferred from [place] to [facility] with Mod A + 2 for safety. Voiding in toilet, with dry brief, as SLP completed session. NT and RN present to complete peri-care and supervision + assistance.  A: Pt appears somewhat stimulable for skilled ST interventions, this date, as evident by less cueing required for orientation to location, situation, and date, intellectual awareness of 2 deficits, intermittent instances of spontaneous verbalizations vs cued responses, and recall of functional information with use of errorless learning, spaced retrieval, and mass practice. Continue to tolerate controlled trials of ice chips and thin water with minimal s/sx concerning for airway invasion. Tolerating current diet, per review of vitals, I/O, and MD notes. Will plan to complete MBSS next date.   P: Pt left in direct care of nursing for toileting needs. Continue per updated ST POC next session.       Pain None/Denies  Therapy/Group: Individual Therapy  Alonzo Loving A Sonya Gunnoe 11/20/2021, 7:08 PM

## 2021-11-20 NOTE — Progress Notes (Signed)
Physical Therapy Weekly Progress Note  Patient Details  Name: Cassandra Dodson MRN: [id number] Date of Birth: March 07, 1941  Beginning of progress report period: November 13, 2021 End of progress report period: November 20, 2021  Patient has met 1 of 4 short term goals. Pt making slow progress towards LTG of modA. Her performance fluctuates depending on fatigue, attention, awareness, and effort. Some days she can require totalA for all bed mobility and transfers and other days she can require as little as modA. She remains incontinent of bowel and bladder with no awareness of this. She has not ambulated other than a few feet in the // bars with maxA. Therapy is trialing a TIS wheelchair due to poor postural control and to improve pressure relief and upright tolerance. Family training has been scheduled but her son did not show. Palliative care consulted to assist with [place] and those remain full code with intention on returning home with son's assist. Anticipate she will require hospital bed, hoyer lift for home.   Patient continues to demonstrate the following deficits muscle weakness and muscle joint tightness, decreased cardiorespiratoy endurance, unbalanced muscle activation and motor apraxia, decreased visual acuity, decreased visual perceptual skills, and decreased visual motor skills, decreased attention to left and decreased motor planning, decreased initiation, decreased attention, decreased awareness, decreased problem solving, decreased safety awareness, decreased memory, and delayed processing, and decreased sitting balance, decreased standing balance, decreased postural control, hemiplegia, and decreased balance strategies and therefore will continue to benefit from skilled PT intervention to increase functional independence with mobility.  Patient not progressing toward long term goals.  Pending progress this week, will downgrade as needed.  Continue plan of care.  PT Short Term Goals Week 2:  PT Short  Term Goal 1 (Week 2): Pt will complete bed mobility with modA PT Short Term Goal 1 - Progress (Week 2): Partly met PT Short Term Goal 2 (Week 2): Pt will complete bed<>chair transfers with modA PT Short Term Goal 2 - Progress (Week 2): Partly met PT Short Term Goal 3 (Week 2): Pt will propel wheelchair 70f with maxA PT Short Term Goal 3 - Progress (Week 2): Not met PT Short Term Goal 4 (Week 2): Pt will demonstrate improved activity tolerance by completing daily therapy sessions PT Short Term Goal 4 - Progress (Week 2): Met Week 3:  PT Short Term Goal 1 (Week 3): STG = LTG due to ELOS     Therapy/Group: Individual Therapy  [name]8/04/2021, 12:46 PM

## 2021-11-21 ENCOUNTER — Inpatient Hospital Stay (HOSPITAL_COMMUNITY): Payer: Medicare Other

## 2021-11-21 DIAGNOSIS — R5383 Other fatigue: Secondary | ICD-10-CM

## 2021-11-21 LAB — BASIC METABOLIC PANEL
Anion gap: 9 (ref 5–15)
BUN: 37 mg/dL — ABNORMAL HIGH (ref 8–23)
CO2: 22 mmol/L (ref 22–32)
Calcium: 9.2 mg/dL (ref 8.9–10.3)
Chloride: 99 mmol/L (ref 98–111)
Creatinine, Ser: 1.34 mg/dL — ABNORMAL HIGH (ref 0.44–1.00)
GFR, Estimated: 40 mL/min — ABNORMAL LOW (ref >60)
Glucose, Bld: 265 mg/dL — ABNORMAL HIGH (ref 70–99)
Potassium: 4.7 mmol/L (ref 3.5–5.1)
Sodium: 130 mmol/L — ABNORMAL LOW (ref 135–145)

## 2021-11-21 LAB — GLUCOSE, CAPILLARY
Glucose-Capillary: 61 mg/dL — ABNORMAL LOW (ref 70–99)
Glucose-Capillary: 105 mg/dL — ABNORMAL HIGH (ref 70–99)
Glucose-Capillary: 208 mg/dL — ABNORMAL HIGH (ref 70–99)
Glucose-Capillary: 274 mg/dL — ABNORMAL HIGH (ref 70–99)

## 2021-11-21 MED ORDER — SENNOSIDES-DOCUSATE SODIUM 8.6-50 MG PO TABS
2.0000 | ORAL_TABLET | Freq: Every day | ORAL | Status: DC
  Administered 2021-11-22 – 2021-11-27 (×6): 2 via ORAL
  Filled 2021-11-21 (×6): qty 2

## 2021-11-21 MED ORDER — INSULIN GLARGINE-YFGN 100 UNIT/ML ~~LOC~~ SOLN
32.0000 [IU] | Freq: Every day | SUBCUTANEOUS | Status: DC
  Administered 2021-11-21 – 2021-11-23 (×3): 32 [IU] via SUBCUTANEOUS
  Filled 2021-11-21 (×3): qty 0.32

## 2021-11-21 MED ORDER — INSULIN ASPART 100 UNIT/ML IJ SOLN: 0.0000 [IU] | Freq: Three times a day (TID) | INTRAMUSCULAR | Status: DC

## 2021-11-21 MED ORDER — INSULIN ASPART 100 UNIT/ML IJ SOLN
0.0000 [IU] | Freq: Three times a day (TID) | INTRAMUSCULAR | Status: DC
  Administered 2021-11-21: 2 [IU] via SUBCUTANEOUS
  Administered 2021-11-22 (×2): 1 [IU] via SUBCUTANEOUS
  Administered 2021-11-22: 2 [IU] via SUBCUTANEOUS
  Administered 2021-11-23 – 2021-11-25 (×4): 1 [IU] via SUBCUTANEOUS
  Administered 2021-11-26: 3 [IU] via SUBCUTANEOUS
  Administered 2021-11-26: 2 [IU] via SUBCUTANEOUS
  Administered 2021-11-27: 1 [IU] via SUBCUTANEOUS

## 2021-11-21 MED ORDER — INSULIN GLARGINE-YFGN 100 UNIT/ML ~~LOC~~ SOLN
32.0000 [IU] | Freq: Every day | SUBCUTANEOUS | Status: DC
Start: 2021-11-22 — End: 2021-11-21

## 2021-11-21 MED ORDER — METHYLPHENIDATE HCL 5 MG PO TABS
5.0000 mg | ORAL_TABLET | Freq: Two times a day (BID) | ORAL | Status: DC
  Administered 2021-11-21 – 2021-11-27 (×13): 5 mg via ORAL
  Filled 2021-11-21 (×13): qty 1

## 2021-11-21 NOTE — Progress Notes (Signed)
Modified Barium Swallow Progress Note  Patient Details  Name: Cassandra Dodson MRN: [id number] Date of Birth: 1941/03/15  Today's Date: 11/21/2021  Modified Barium Swallow completed.  Full report located under Chart Review in the Imaging Section.  Brief recommendations include the following:  Clinical Impression MBSS completed. Pt presents with mild oral and mild-moderate pharyngeal dysphagia in the setting of subacute R pontine CVA. Oral phase c/b generalized lingual weakness with resultant lingual pumping, decreased posterior propulsion + containment, and premature spillage. Pharyngeal phase c/b mildly decreased BOT approximation to PPW, diminished laryngeal closure, and incoordination of apenic period and swallow initiation which occurred consistent at the level of the pyriform sinuses. UES appeared [facility].  Aforementioned deficits resulted in consistent shallow (trace-transient) and silent cord-level penetration with all thin liquid trials with no attempt to clear penetrated material. Unable to fully r/o trace aspiration of thin liquid; however, suspect trace aspiration occurred due to wet vocal quality appreciated and pt's inability to consistently follow commands to clear penetrated material, and when she did attempt, weak, unproductive throat clear was produced. Due to pt's body habitus, it was difficulty to fully visualize the trachea. No penetration nor aspiration appreciated with Dysphagia 1 or Dysphagia 3 textures. Nectar-thick liquid via cup was administered with only shallow trace-transit penetration. Trialed use of chin tuck with thin liquid consumption with no functional change in swallow safety and increased need for cueing to initiate swallow response. It should be noted that pt's overall decreased endurance/weakness appeared to negatively impact pt's performance.   Given instrumental findings, recommend continuation of current diet textures (Dysphagia 3 - mechanical soft with nectar-thick  liquids) with full supervision for adherence to aspiration precautions outlined below. Given risk for dehydration with long-term use of thickened liquids and decreased quality of life, may initiate water protocol of ice chips (crushed ice) and thin water via tsp sips only between meals and following meticulous oral care. If water is provided via tsp, please allow extra time between sips, insure pt is fully awake/accepting, and positioned upright (preferably OOB) to mitigate aspiration risk.    Swallow Evaluation Recommendations   SLP Diet Recommendations: Dysphagia 3 (Mech soft) solids;Nectar thick liquid (Initiate water protocol - thin water via tsp sips only following oral care)   Liquid Administration via: Cup;No straw   Medication Administration: Whole meds with puree   Supervision: Patient able to self feed;Full supervision/cueing for compensatory strategies   Compensations: Slow rate;Small sips/bites;Lingual sweep for clearance of pocketing;Follow solids with liquid   Postural Changes: Seated upright at 90 degrees;Remain semi-upright after after feeds/meals (Comment)   Oral Care Recommendations: Oral care BID   Jaliza Seifried A Angelgabriel Willmore 11/21/2021,1:40 PM

## 2021-11-21 NOTE — Progress Notes (Signed)
PROGRESS NOTE   Subjective/Complaints:  She had hypoglycemia this AM.  She is having MBS this AM.   LBM today  ROS: denies pain, +urinary incontinence, denies cough, no CP or SOB, no palpitations  Objective:   No results found. No results for input(s): "WBC", "HGB", "HCT", "PLT" in the last 72 hours.    No results for input(s): "NA", "K", "CL", "CO2", "GLUCOSE", "BUN", "CREATININE", "CALCIUM" in the last 72 hours.     Intake/Output Summary (Last 24 hours) at 11/21/2021 1109 Last data filed at 11/21/2021 1004 Gross per 24 hour  Intake 1663 ml  Output --  Net 1663 ml      Pressure Injury 11/20/21 Sacrum Mid Stage 2 -  Partial thickness loss of dermis presenting as a shallow open injury with a red, pink wound bed without slough. (Active)  11/20/21 1217  Location: Sacrum  Location Orientation: Mid  Staging: Stage 2 -  Partial thickness loss of dermis presenting as a shallow open injury with a red, pink wound bed without slough.  Wound Description (Comments):   Present on Admission: No    Physical Exam: Vital Signs Blood pressure (!) 123/58, pulse 82, temperature 98.2 F (36.8 C), resp. rate 17, height '5\' 5"'$  (1.651 m), weight 78.1 kg, SpO2 97 %.  General: No acute distress, sitting in bed Mood and affect are appropriate Heart: Regular rate and rhythm no rubs murmurs or extra sounds Lungs: Clear to auscultation, breathing unlabored, no rales or wheezes Abdomen: Positive bowel sounds, soft nontender to palpation, nondistended Extremities: No clubbing, cyanosis, or edema  Skin: mild stage 1 on sacrum, otherwise warm and dry Neurological:     Comments: Alert, slow to process.  Mild dysarthria.  Oriented to self and place.  Follows simple commands.   HOH. Cranial nerves II through XII intact, motor strength is 5/5 in RIght and 4- Left deltoid, bicep, tricep, grip,5/5 Right 0/5 left side limited by current  lethargy Sensory exam normal sensation to light touch and proprioception in bilateral upper and lower extremities Self feeds with MinA   Assessment/Plan: 1. Functional deficits which require 3+ hours per day of interdisciplinary therapy in a comprehensive inpatient rehab setting. Physiatrist is providing close team supervision and 24 hour management of active medical problems listed below. Physiatrist and rehab team continue to assess barriers to discharge/monitor patient progress toward functional and medical goals  Care Tool:  Bathing    Body parts bathed by patient: Left arm, Face, Chest   Body parts bathed by helper: Right arm, Abdomen, Front perineal area, Buttocks, Right upper leg, Left upper leg, Right lower leg, Left lower leg     Bathing assist Assist Level: Maximal Assistance - Patient 24 - 49%     Upper Body Dressing/Undressing Upper body dressing   What is the patient wearing?: Pull over shirt    Upper body assist Assist Level: Moderate Assistance - Patient 50 - 74%    Lower Body Dressing/Undressing Lower body dressing      What is the patient wearing?: Pants, Underwear/pull up     Lower body assist Assist for lower body dressing: Maximal Assistance - Patient 25 - 49%  Toileting Toileting    Toileting assist Assist for toileting: Dependent - Patient 0%     Transfers Chair/bed transfer  Transfers assist  Chair/bed transfer activity did not occur: Safety/medical concerns  Chair/bed transfer assist level: Moderate Assistance - Patient 50 - 74%     Locomotion Ambulation   Ambulation assist      Assist level: Maximal Assistance - Patient 25 - 49% Assistive device: Parallel bars Max distance: 40f   Walk 10 feet activity   Assist  Walk 10 feet activity did not occur: Safety/medical concerns        Walk 50 feet activity   Assist Walk 50 feet with 2 turns activity did not occur: Safety/medical concerns         Walk 150 feet  activity   Assist Walk 150 feet activity did not occur: Safety/medical concerns         Walk 10 feet on uneven surface  activity   Assist Walk 10 feet on uneven surfaces activity did not occur: Safety/medical concerns         Wheelchair     Assist Is the patient using a wheelchair?: Yes Type of Wheelchair: Manual    Wheelchair assist level: Total Assistance - Patient < 25% Max wheelchair distance: 10'    Wheelchair 50 feet with 2 turns activity    Assist        Assist Level: Dependent - Patient 0%   Wheelchair 150 feet activity     Assist      Assist Level: Dependent - Patient 0%   Blood pressure (!) 123/58, pulse 82, temperature 98.2 F (36.8 C), resp. rate 17, height '5\' 5"'$  (1.651 m), weight 78.1 kg, SpO2 97 %.    Medical Problem List and Plan: 1. 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  Impaired mobility, working on transfers -Continue Lovenox             -antiplatelet therapy: Aspirin 81 mg daily and Plavix 75 mg day x3 to 6 months 3. Pain Management: Tylenol as needed 4. Anxiety: continue Xanax 0.25-0.5 mg nightly as needed             -antipsychotic agents: N/A 5. Neuropsych/cognition: This patient is capable of making decisions on her own behalf. 6. Skin/Wound Care: Routine skin checks 7. Fluids/Electrolytes/Nutrition: Routine in and outs with follow-up chemistries 8.  Left frontal large meningioma.  Follow-up neurosurgery Dr. EEllene Route  Plan for possible surgical resection 8-12 weeks.   -per NS decadron tapered to '2mg'$  q12 x 3 days,  completes 7/23 9.  Hypertension.  improved, continue Norvasc 10 mg daily, maintain avapro, d/c hydralazine  -improving with steroid taper  -Fair control overall,  continue current medications 10.  Diabetes mellitus.  Hemoglobin A1c 7.0.   -cbg's improving increase Semglee to 37 units daily  -  d/c ISS Well controlled, decrease CBG checks to AC BID -may need further adjustment after steroids dc  CBG (last 3)  Recent Labs    11/20/21 1601  11/21/21 0557 11/21/21 0705  GLUCAP 272* 61* 105*     8/1 Labile CBG, will consult DM coordinator  8/2 Decrease Semglee to 32u, restart 0-6 unit correctional insulin 11.  Hyperlipidemia.  Fenofibrate/Crestor 12.  CKD stage III.  Latest creatinine 1.4 on 11/12/21.  Baseline 1.22-1.69.  Recheck labs 8/2 13.  History of endometrial cancer.  Follow-up outpatient 14.  GERD.  continue Protonix 15. Vasovagal episode 7/18: placed teds hose on only with therapy, discussed with therapy 16. Overweight BMI 29.72: provide dietary education 17. 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 This has been discussed with son who is a retired Social research officer, government. Discussed plans for menigioma resection in a few weeks with son. 18. Dysphagia: plan for MBS next week 19. Neurogenic bowel and bladder: bladder program 20. Hard of hearing: needs hearing aides, will need to ask son to bring from home.  21. Increased lethargy: improving, Stat CH and routine MRI brain without contrast ordered. Shows small increase in prior stroke, discussed with son and neurology. WBC reviewed and within normal limits but UA was positive, treat UTI as below -8/2 Start ritalin '5mg'$  BID for activation 22. +UA with history of ESBL no dysuria or fever : ceftriaxone started, UC with 80,000 gram negative rods, f/u sensitivities- Ceftriaxone not tested S to Cefepime will switch, UCX with pseudomonas  -Dysuria improved, will plan to  continue cefepime for 7 days   LOS: 16 days A FACE TO FACE EVALUATION WAS PERFORMED  Jennye Boroughs 11/21/2021, 11:09 AM

## 2021-11-21 NOTE — Progress Notes (Signed)
Occupational Therapy Session Note  Patient Details  Name: Cassandra Dodson MRN: [id number] Date of Birth: November 24, 1940  Today's Date: 11/21/2021 OT Individual Time: 0732-0830 OT Individual Time Calculation (min): 58 min    Short Term Goals: Week 2:  OT Short Term Goal 1 (Week 2): Patient will bathe upper body with min assist following set up OT Short Term Goal 1 - Progress (Week 2): Partly met (Not yet consistent) OT Short Term Goal 2 (Week 2): Patient will don pull over shirt with min assist OT Short Term Goal 2 - Progress (Week 2): Met OT Short Term Goal 3 (Week 2): Patient will trasnfer to/from commode with mod assist OT Short Term Goal 3 - Progress (Week 2): Met OT Short Term Goal 4 (Week 2): Patient will pull pants up/down over hips with mod assist OT Short Term Goal 4 - Progress (Week 2): Not met OT Short Term Goal 5 (Week 2): Patient will visualy scan to left to locate needed hygiene or food items OT Short Term Goal 5 - Progress (Week 2): Met  Skilled Therapeutic Interventions/Progress Updates:    Patient received supine in bed, sleepy but able to arouse to voice.  Patient with dry brief on, assisted patient to sitting to don pants.  Patient unable to safely bend to feet, or with strength to bring feet up to thread pants over feet.  Used walker in front and slightly elevated bed - had patient stand to pull up pants.  Patient cued and facilitated to stand tall while pants pulled up over hips.  Patient transferred - stand step to wheelchair with mod assist and increased time.  Worked on actively scooting hips back in wheelchair.  To prevent sliding forward.  Patient sat upright (with back away from back of wheelchair for 50% of her breakfast.  Cueing provided to alternate liquids and solids, and clear mouth before adding food.  Completed hygiene and upper body bathing / dressing at sink with mod assist. Patient left up in wheelchair with seatbelt in place, chair tipped back, and safety belt in  place.  Call bell within reach.    Therapy Documentation Precautions:  Precautions Precautions: Fall Precaution Comments: L hemi, incontinent Restrictions Weight Bearing Restrictions: No  Pain:  Denies pain    Therapy/Group: Individual Therapy  Mariah Milling 11/21/2021, 12:45 PM

## 2021-11-21 NOTE — Progress Notes (Signed)
Hypoglycemic Event  CBG: 61  Treatment: 8 oz juice/soda  Symptoms: None  Follow-up CBG: Time:0705 CBG Result:105  Possible Reasons for Event: Unknown  Comments/MD notified: Algis Liming, PA    Belva Chimes

## 2021-11-21 NOTE — Progress Notes (Signed)
Patient ID: Cassandra Dodson, female   DOB: [birth year], 81 y.o.   MRN: [id number]  Met briefly with son wh was here visiting to update regarding team conference goals of mod/max assist and need for multiple days of family education. He is feeling much better and can be here Friday from 9;00-12:00 and will schedule for Monday also for additional sessions. He has much education and made aware pt is 24/7 physical care.  He feels he can manage her needs and will have some help from others. Our discharge date is still 8/8. Will see how Friday goes.

## 2021-11-21 NOTE — Progress Notes (Signed)
Patient on insulin glargine and BID cbg checks. Also on D3, nectar which is likely affecting fluid intake. Will change CBG to ac/hs and add bedtime snack as dropped to 61 this am. Patient with worsening of renal status with rise in SCr to 1.44 on 07/28. Will check labs today to make sure that she does not need IVF for hydration.

## 2021-11-21 NOTE — Progress Notes (Signed)
Physical Therapy Session Note  Patient Details  Name: Cassandra Dodson MRN: [id number] Date of Birth: 01-11-1941  Today's Date: 11/21/2021 PT Individual Time: 1000-1056 PT Individual Time Calculation (min): 56 min   Short Term Goals: Week 3:  PT Short Term Goal 1 (Week 3): STG = LTG due to ELOS  Skilled Therapeutic Interventions/Progress Updates:     Pt supine in bed to start. Agreeable to therapy session and denies pain. Tech from [employer] present to take measurements for custom wheelchair - discussed general recommendations of TIS with adjustable arm rests, firm contoured back, head rest, roho cushion with incontinent cover, and adjustable leg rests. Plan for loaner to be delivered and then formal consult with [employer] on Monday 8/7 prior to DC.   Supine<>sitting EOB with maxA for trunk support and BLE management. Able to sit unsupported at EOB with close supervision during measurements. Squat<>pivot transfer with light modA from EOB to w/c, towards her stronger R side. Required assist for repositioning once in w/c.   Pt requesting a drink of water. Provided her with nectar thick consistently lemon water with straw - required cues for slow and small sips with pt coughing once.   Transported to main rehab gym for time and wheeled facing large mirror with // bar in front of her. Worked on sit<>stands and standing tolerance while pulling herself up to standing. Required modA for powering to rise for the first 2 stands and was able to stand for ~30 seconds prior to requesting to sit due to fatigue. During the 3rd stand, pt unable to initiate and began sliding towards edge of chair, he BLE flexed underneath her and pt unable to safely reposition. Called for +2 assist for safety due to concern for sliding out of chair.   Resorted to standing frame for safety concerns. Completed x2 stands in frame with totalA and standing for ~1 minute prior to fatigue. In standing, worked on upright posture, visual scanning.    Returned to [facility] w/c and reclined and repositioned for comfort and safety. Applied safety belt and seat belt - concluded session seated in chair at nurses station to allow stimulation and change of environment. LPN notified and agreeable to monitor. All needs met.   Therapy Documentation Precautions:  Precautions Precautions: Fall Precaution Comments: L hemi, incontinent Restrictions Weight Bearing Restrictions: No General:    Therapy/Group: Individual Therapy  Alger Simons 11/21/2021, 7:35 AM

## 2021-11-21 NOTE — Patient Care Conference (Signed)
Inpatient RehabilitationTeam Conference and Plan of Care Update Date: 11/21/2021   Time: 11:06 AM    Patient Name: Cassandra Dodson      Medical Record Number: [id number]  Date of Birth: 1940-11-06 Sex: Female         Room/Bed: 4W15C/4W15C-01 Payor Info: Payor: [employer] MEDICARE / Plan: [facility] MEDICARE / Product Type: *No Product type* /    Admit Date/Time:  11/05/2021  3:00 PM  Primary Diagnosis:  Right pontine cerebrovascular accident [facility])  Hospital Problems: Principal Problem:   Right pontine cerebrovascular accident [facility])    Expected Discharge Date: Expected Discharge Date: 11/27/21  Team Members Present: Physician leading conference: Dr. Jennye Boroughs Social Worker Present: Ovidio Kin, LCSW Nurse Present: Dorthula Nettles, RN PT Present: Ginnie Smart, PT OT Present: Other (comment) Criss Rosales, OT) SLP Present: Helaine Chess, SLP PPS Coordinator present : Gunnar Fusi, SLP     Current Status/Progress Goal Weekly Team Focus  Bowel/Bladder   Patient is incontinent of B/B,LBM 11/20/21  regain continence  Assess toileting needs Q2 hrs and prn   Swallow/Nutrition/ Hydration   Dys 3 diet with NTL liquids; Mod-Max A to implement aspiration precautions. Initiate water protocol via tsp  Mod A  Tolerance of water protocol   ADL's   Has done mod assist transfers on occasion when attentive to task, focused attention improving, easily distracted by visual/auditory stimulation) Max assist for lower body bathing/dressing, total assist all aspects of toileting - no continent episodes,  Min assist overall - BADL - will need to consider downgrading to mod assist  Timed toileting?, Needs out of bed schedule to build activity tolerance without exhausting patient, needs improved postural control - sustained sitting balance static to dynamic, family education - anticipate multiple sessions   Mobility   maxA bed mobility, mod to maxA squat<>pivot transfers and sit<>stand transfers.  Remains incontinent with no awareness. Has c/o sacral pain during sessions. Will be wheelchair level at discharge.  Have been downgraded to [place] but will likely need another downgrade to [facility] education!! DC planning, wheelchair evaluation.   Communication   GOAL MET for speech intelligibiltiy  GOAL MET  Not addressing   Safety/Cognition/ Behavioral Observations  Sup A question cues to Min A for orientation, attention, basic recall, and intellectual awareness; Max A for basic basic problem-solving  Max A for memory, awareness, problem-solving, memory; Mod A for orientation  orientation, memory, use of external aids, basic problem-solving, and intellectual awareness   Pain   Denies pain  < 2  Assess pain QS.PRN   Skin   Patient has a stage 2 to sacral area,,bilateral boggy heels, Bruising/ecchymosis areas  to bilateral resolving  skin integrity  Assess skin QS and prn,,    elevate feet on pillows and reposition Q2 hrs     Discharge Planning:  Had scheduled family education with son but he is home sick have re-scheduled for possibly Friday and Monday if well enough. Son wants pt to go home, aware will require 24/7 care at DC   Team Discussion: No new complaints. Positive UTI with Abx treatment. CBG's labile, decreased long acting insulin, restart SS. Incontinent B/B, MASD with cream. Son has been sick, will schedule family education. Will need 24/7 care.  Pockets on left. Had assisted fall on 11/16/21. WC eval initiated today. Will need Hoyer lift, hospital bed, tilt-n-space WC, and suction for home. Palliative Care consulted for [place]. Poor attention.   Patient on target to meet rehab goals: no, goals decreased  to mod assist and decreased again to max assist. STS and SP transfers vary. Fatigues quickly. Extreme high fall risk. Dys 3 diet, Nectar thick liquids. May have water via teaspoon. Able to state that she had a stroke, and is in the hospital. Will teach bed level care.  *See Care Plan  and progress notes for long and short-term goals.   Revisions to Treatment Plan:  Adjusting medications, therapy downgrading goals.   Teaching Needs: Family education, medication management, bowel/bladder management, skin/wound care, transfer training, etc.   Current Barriers to Discharge: Decreased caregiver support, Home enviroment access/layout, Incontinence, Wound care, Lack of/limited family support, Insurance for SNF coverage, Pending surgery, and Nutritional means  Possible Resolutions to Barriers: Family education Time toilet  Follow-up therapy Order recommended DME Palliative GOC Possible SNF placement if family agreeable     Medical Summary Current Status: UTI, Decreased activation, HTN, DM  Barriers to Discharge: Home enviroment access/layout;Medical stability;IV antibiotics  Barriers to Discharge Comments: UTI, Decreased activation, HTN, DM Possible Resolutions to [employer] Focus: Treatment of UTI, adjust insulin, monitor BM, Start ritalin   Continued Need for Acute Rehabilitation Level of Care: The patient requires daily medical management by a physician with specialized training in physical medicine and rehabilitation for the following reasons: Direction of a multidisciplinary physical rehabilitation program to maximize functional independence : Yes Medical management of patient stability for increased activity during participation in an intensive rehabilitation regime.: Yes Analysis of laboratory values and/or radiology reports with any subsequent need for medication adjustment and/or medical intervention. : Yes   I attest that I was present, lead the team conference, and concur with the assessment and plan of the team.   Cristi Loron 11/21/2021, 6:53 PM

## 2021-11-22 DIAGNOSIS — R1312 Dysphagia, oropharyngeal phase: Secondary | ICD-10-CM

## 2021-11-22 DIAGNOSIS — N183 Chronic kidney disease, stage 3 unspecified: Secondary | ICD-10-CM

## 2021-11-22 DIAGNOSIS — E871 Hypo-osmolality and hyponatremia: Secondary | ICD-10-CM

## 2021-11-22 LAB — GLUCOSE, CAPILLARY
Glucose-Capillary: 166 mg/dL — ABNORMAL HIGH (ref 70–99)
Glucose-Capillary: 180 mg/dL — ABNORMAL HIGH (ref 70–99)
Glucose-Capillary: 223 mg/dL — ABNORMAL HIGH (ref 70–99)
Glucose-Capillary: 140 mg/dL — ABNORMAL HIGH (ref 70–99)

## 2021-11-22 MED ORDER — PROCHLORPERAZINE MALEATE 5 MG PO TABS
10.0000 mg | ORAL_TABLET | Freq: Four times a day (QID) | ORAL | Status: DC | PRN
Start: 2021-11-22 — End: 2021-11-27

## 2021-11-22 MED ORDER — PANTOPRAZOLE SODIUM 40 MG IV SOLR: 40.0000 mg | Freq: Two times a day (BID) | INTRAVENOUS | Status: DC

## 2021-11-22 MED ORDER — PANTOPRAZOLE SODIUM 40 MG PO TBEC: 40.0000 mg | DELAYED_RELEASE_TABLET | Freq: Every day | ORAL | Status: DC

## 2021-11-22 MED ORDER — PANTOPRAZOLE SODIUM 40 MG PO TBEC
40.0000 mg | DELAYED_RELEASE_TABLET | Freq: Every day | ORAL | Status: DC
  Administered 2021-11-23 – 2021-11-27 (×5): 40 mg via ORAL
  Filled 2021-11-22 (×5): qty 1

## 2021-11-22 MED ORDER — PROCHLORPERAZINE 25 MG RE SUPP
25.0000 mg | Freq: Two times a day (BID) | RECTAL | Status: DC | PRN
Start: 2021-11-22 — End: 2021-11-27

## 2021-11-22 MED ORDER — PROCHLORPERAZINE EDISYLATE 10 MG/2ML IJ SOLN
10.0000 mg | Freq: Four times a day (QID) | INTRAMUSCULAR | Status: DC | PRN
Start: 2021-11-22 — End: 2021-11-27

## 2021-11-22 NOTE — Progress Notes (Signed)
PROGRESS NOTE   Subjective/Complaints:  No new complaints or concerns this AM. CBGs appear to be a little more stable today. MBS done yesterday.  LBM today  ROS: denies pain, +urinary incontinence, denies cough, no CP or SOB, no palpitations  Objective:   DG Swallowing Func-Speech Pathology  Result Date: 11/21/2021 Table formatting from the original result was not included. Objective Swallowing Evaluation: Type of Study: MBS-Modified Barium Swallow Study  Patient Details Name: Cassandra Dodson MRN: [id number] Date of Birth: 1941-03-09 Today's Date: 11/21/2021 Time: SLP Start Time (ACUTE ONLY): 18 -SLP Stop Time (ACUTE ONLY): 0354 SLP Time Calculation (min) (ACUTE ONLY): 20 min Past Medical History: Past Medical History: Diagnosis Date  Arthritis   CKD (chronic kidney disease), stage III ([place])   patient denies  Depression   Diabetes mellitus without complication ([place])   Difficult intravenous access   Endometrial cancer ([place])   GERD (gastroesophageal reflux disease)   Headache   History of radiation therapy 11/04/2019-12/01/2019  Endometrial HDR; Dr. Gery Pray  Hypertension   Hypothyroidism   Neuromuscular disorder ([place])   neuropathy in feet  Osteoarthritis   PMB (postmenopausal bleeding)   PONV (postoperative nausea and vomiting)   severe nausea and vomiting after knee replacement 06-2014, did ok with 2018 knee replacement  Urinary frequency   Wears glasses  Past Surgical History: Past Surgical History: Procedure Laterality Date  BREAST SURGERY    cyst removed  CESAREAN SECTION    DILATION AND CURETTAGE OF UTERUS N/A 06/24/2019  Procedure: DILATATION AND CURETTAGE;  Surgeon: Lafonda Mosses, MD;  Location: [facility];  Service: Gynecology;  Laterality: N/A;  FRACTURE SURGERY    right knee  INTRAUTERINE DEVICE (IUD) INSERTION N/A 06/24/2019  Procedure: INTRAUTERINE DEVICE (IUD) INSERTION MIRENA;  Surgeon: Lafonda Mosses, MD;   [facility]: [facility];  Service: Gynecology;  Laterality: N/A;  IR ANGIO INTRA EXTRACRAN SEL COM CAROTID INNOMINATE UNI R MOD SED  11/05/2021  IR ANGIO VERTEBRAL SEL VERTEBRAL UNI R MOD SED  11/05/2021  IR CT HEAD LTD  11/01/2021  IR INTRA CRAN STENT  11/01/2021  IR US GUIDE VASC ACCESS RIGHT  11/01/2021  IRRIGATION AND DEBRIDEMENT SEBACEOUS CYST    JOINT REPLACEMENT Right   right  LYMPH NODE DISSECTION N/A 09/14/2019  Procedure: LYMPH NODE DISSECTION;  Surgeon: Lafonda Mosses, MD;  Location: [facility];  Service: Gynecology;  Laterality: N/A;  RADIOLOGY WITH ANESTHESIA N/A 11/01/2021  Procedure: ANGIOPLASTY/STENT;  Surgeon: Luanne Bras, MD;  Location: [facility];  Service: Radiology;  Laterality: N/A;  ROBOTIC ASSISTED TOTAL HYSTERECTOMY WITH BILATERAL SALPINGO OOPHERECTOMY Bilateral 09/14/2019  Procedure: XI ROBOTIC ASSISTED TOTAL HYSTERECTOMY WITH BILATERAL SALPINGO OOPHORECTOMY;  Surgeon: Lafonda Mosses, MD;  Location: [facility];  Service: Gynecology;  Laterality: Bilateral;  SENTINEL NODE BIOPSY N/A 09/14/2019  Procedure: SENTINEL NODE BIOPSY;  Surgeon: Lafonda Mosses, MD;  Location: [facility];  Service: Gynecology;  Laterality: N/A;  teeth extration    TONSILLECTOMY    TOTAL HIP ARTHROPLASTY Right 02/23/2015  Procedure: RIGHT TOTAL HIP ARTHROPLASTY ANTERIOR APPROACH;  Surgeon: Leandrew Koyanagi, MD;  Location: [facility];  Service: Orthopedics;  Laterality: Right;  TOTAL KNEE ARTHROPLASTY  Left 06/27/2016  Procedure: LEFT TOTAL KNEE ARTHROPLASTY;  Surgeon: Leandrew Koyanagi, MD;  Location: [facility];  Service: Orthopedics;  Laterality: Left; HPI: Pt is an 81 y/o female admitted secondary to slurred speech and L sided weakness. 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 FEES 7/8 revealed  a mild-moderate oropharyngeal dysphagia. Oral phase appeared to be mostly impacted by cognition. pharyngeally she had delayed initiation that resulted in penetration of thin (very close to the vocal folds) and nectar thick liquids (which cleared spontaneously). There was some concern for aspiration of thin liquids on delayed coughing as well. Dys 3 diet and nectar thick liquids recommended. Pt went to IR for stent 7/13 and remained intubated post-op; extubated 7/14, after which she passed a [facility] swallow screen. SLP re-ordered for swallowing 7/15 when observed to be coughing with thin liquids.  PMH including but not limited to HTN, DM, CKD3, GERD and endometrial cancer.  Subjective: cooperative but confused  Recommendations for follow up therapy are one component of a multi-disciplinary discharge planning process, led by the attending physician.  Recommendations may be updated based on patient status, additional functional criteria and insurance authorization. Assessment / Plan / Recommendation   11/21/2021   1:38 PM Clinical Impressions Clinical Impression MBSS completed. Pt presents with mild oral and mild-moderate pharyngeal dysphagia in the setting of subacute R pontine CVA. Oral phase c/b generalized lingual weakness with resultant lingual pumping, decreased posterior propulsion + containment, and premature spillage. Pharyngeal phase c/b mildly decreased BOT approximation to PPW, diminished laryngeal closure, and incoordination of apenic period and swallow initiation which occurred consistent at the level of the pyriform sinuses. UES appeared [facility].  Aforementioned deficits resulted in consistent shallow (trace-transient) and silent cord-level penetration with all thin liquid trials with no attempt to clear penetrated material. Unable to fully r/o trace aspiration of thin liquid; however, suspect trace aspiration occurred due to wet vocal quality appreciated and pt's inability to consistently follow commands to  clear penetrated material, and when she did attempt, weak, unproductive throat clear was produced. Due to pt's body habitus, it was difficulty to fully visualize the trachea. No penetration nor aspiration appreciated with Dysphagia 1 or Dysphagia 3 textures. Nectar-thick liquid via cup was administered with only shallow trace-transit penetration. Trialed use of chin tuck with thin liquid consumption with no functional change in swallow safety and increased need for cueing to initiate swallow response. It should be noted that pt's overall decreased endurance/weakness appeared to negatively impact pt's performance.  Given instrumental findings, recommend continuation of current diet textures (Dysphagia 3 - mechanical soft with nectar-thick liquids) with full supervision for adherence to aspiration precautions outlined below. Given risk for dehydration with long-term use of thickened liquids and decreased quality of life, may initiate water protocol of ice chips (crushed ice) and thin water via tsp sips only between meals and following meticulous oral care. If water is provided via tsp, please allow extra time between sips, insure pt is fully awake/accepting, and positioned upright (preferably OOB) to mitigate aspiration risk.  SLP Visit Diagnosis Dysphagia, oropharyngeal phase (R13.12);Cognitive communication deficit (R41.841) Frontal lobe and executive function deficit following Cerebral infarction Impact on safety and function Mild aspiration risk     11/03/2021  12:00 PM Treatment Recommendations Treatment Recommendations Therapy as outlined in  treatment plan below     11/21/2021   1:39 PM Prognosis Prognosis for Safe Diet Advancement Fair Barriers to Reach Goals Cognitive deficits;Time post onset;Severity of deficits   11/21/2021   1:38 PM Diet Recommendations SLP Diet Recommendations Dysphagia 3 (Mech soft) solids;Nectar thick liquid Liquid Administration via Cup;No straw Medication Administration Whole meds with  puree Compensations Slow rate;Small sips/bites;Lingual sweep for clearance of pocketing;Follow solids with liquid Postural Changes Seated upright at 90 degrees;Remain semi-upright after after feeds/meals (Comment)     11/21/2021   1:38 PM Other Recommendations Oral Care Recommendations Oral care BID   11/03/2021  12:00 PM Frequency and Duration  Speech Therapy Frequency (ACUTE ONLY) min 2x/week Treatment Duration 2 weeks     11/21/2021   1:33 PM Oral Phase Oral Phase Impaired Oral - Nectar Teaspoon NT Oral - Nectar Cup Lingual pumping;Weak lingual manipulation;Delayed oral transit;Decreased bolus cohesion;Premature spillage Oral - Nectar Straw NT Oral - Thin Teaspoon Weak lingual manipulation;Lingual pumping;Delayed oral transit;Decreased bolus cohesion;Premature spillage Oral - Thin Cup Weak lingual manipulation;Lingual pumping;Premature spillage;Decreased bolus cohesion;Delayed oral transit Oral - Thin Straw Delayed oral transit;Decreased bolus cohesion;Premature spillage;Weak lingual manipulation;Lingual pumping Oral - Puree Lingual pumping;Weak lingual manipulation;Delayed oral transit Oral - Mech Soft Lingual pumping;Weak lingual manipulation;Delayed oral transit Oral - Regular NT Oral - Multi-Consistency NT Oral - Pill NT    11/21/2021   1:34 PM Pharyngeal Phase Pharyngeal Phase Impaired Pharyngeal- Nectar Teaspoon NT Pharyngeal- Nectar Cup Delayed swallow initiation-pyriform sinuses;Reduced laryngeal elevation;Reduced airway/laryngeal closure;Reduced tongue base retraction;Penetration/Aspiration during swallow;Pharyngeal residue - valleculae Pharyngeal Material enters airway, remains ABOVE vocal cords then ejected out Pharyngeal- Nectar Straw NT Pharyngeal- Thin Teaspoon Delayed swallow initiation-pyriform sinuses;Reduced laryngeal elevation;Reduced airway/laryngeal closure;Reduced tongue base retraction;Pharyngeal residue - valleculae;Pharyngeal residue - pyriform Pharyngeal Material does not enter airway  Pharyngeal- Thin Cup Delayed swallow initiation-pyriform sinuses;Reduced laryngeal elevation;Reduced airway/laryngeal closure;Penetration/Aspiration during swallow;Trace aspiration;Pharyngeal residue - valleculae;Pharyngeal residue - pyriform;Compensatory strategies attempted (with notebox) Pharyngeal Material enters airway, CONTACTS cords and not ejected out Pharyngeal- Thin Straw Delayed swallow initiation-pyriform sinuses;Reduced laryngeal elevation;Reduced airway/laryngeal closure;Reduced tongue base retraction;Penetration/Aspiration during swallow;Trace aspiration;Pharyngeal residue - valleculae;Pharyngeal residue - pyriform Pharyngeal Material enters airway, CONTACTS cords and not ejected out Pharyngeal- Puree Delayed swallow initiation-vallecula;Pharyngeal residue - valleculae Pharyngeal Material does not enter airway Pharyngeal- Mechanical Soft Delayed swallow initiation-vallecula;Pharyngeal residue - valleculae Pharyngeal Material does not enter airway Pharyngeal- Regular NT Pharyngeal- Multi-consistency NT Pharyngeal- Pill NT    11/21/2021   1:38 PM Cervical Esophageal Phase  Cervical Esophageal Phase [facility] Bethany A Lutes 11/21/2021, 1:42 PM                     No results for input(s): "WBC", "HGB", "HCT", "PLT" in the last 72 hours.    Recent Labs    11/21/21 1327  NA 130*  K 4.7  CL 99  CO2 22  GLUCOSE 265*  BUN 37*  CREATININE 1.34*  CALCIUM 9.2       Intake/Output Summary (Last 24 hours) at 11/22/2021 1220 Last data filed at 11/22/2021 0830 Gross per 24 hour  Intake 1190 ml  Output --  Net 1190 ml      Pressure Injury 11/20/21 Sacrum Mid Stage 2 -  Partial thickness loss of dermis presenting as a shallow open injury with a red, pink wound bed without slough. (Active)  11/20/21 1217  Location: Sacrum  Location Orientation: Mid  Staging: Stage 2 -  Partial thickness loss of dermis presenting as a shallow open injury with a red,  pink wound bed without slough.  Wound Description  (Comments):   Present on Admission: No    Physical Exam: Vital Signs Blood pressure (!) 124/55, pulse 95, temperature 98.5 F (36.9 C), resp. rate 18, height '5\' 5"'$  (1.651 m), weight 78.1 kg, SpO2 97 %.  General: No acute distress, sitting in bed Mood and affect are appropriate Heart: Regular rate and rhythm no rubs murmurs or extra sounds Lungs: Clear to auscultation, breathing unlabored, no rales or wheezes Abdomen: Positive bowel sounds, soft nontender to palpation, nondistended Extremities: No clubbing, cyanosis, or edema  Skin: mild stage 1 on sacrum, otherwise warm and dry Neurological:     Comments: Alert, slow to process.  Mild dysarthria.  Oriented to self and place.  Follows simple commands.   HOH. Cranial nerves II through XII intact, motor strength is 5/5 in RIght and 4- Left deltoid, bicep, tricep, grip,5/5 Right 0/5 left side limited by current lethargy Sensory exam normal sensation to light touch and proprioception in bilateral upper and lower extremities Self feeds with MinA   Assessment/Plan: 1. Functional deficits which require 3+ hours per day of interdisciplinary therapy in a comprehensive inpatient rehab setting. Physiatrist is providing close team supervision and 24 hour management of active medical problems listed below. Physiatrist and rehab team continue to assess barriers to discharge/monitor patient progress toward functional and medical goals  Care Tool:  Bathing    Body parts bathed by patient: Left arm, Face, Chest   Body parts bathed by helper: Right arm, Abdomen, Front perineal area, Buttocks, Right upper leg, Left upper leg, Right lower leg, Left lower leg     Bathing assist Assist Level: Maximal Assistance - Patient 24 - 49%     Upper Body Dressing/Undressing Upper body dressing   What is the patient wearing?: Pull over shirt    Upper body assist Assist Level: Moderate Assistance - Patient 50 - 74%    Lower Body Dressing/Undressing Lower  body dressing      What is the patient wearing?: Pants, Incontinence brief     Lower body assist Assist for lower body dressing: Maximal Assistance - Patient 25 - 49%     Toileting Toileting    Toileting assist Assist for toileting: Dependent - Patient 0%     Transfers Chair/bed transfer  Transfers assist  Chair/bed transfer activity did not occur: Safety/medical concerns  Chair/bed transfer assist level: Moderate Assistance - Patient 50 - 74%     Locomotion Ambulation   Ambulation assist      Assist level: Maximal Assistance - Patient 25 - 49% Assistive device: Parallel bars Max distance: 20f   Walk 10 feet activity   Assist  Walk 10 feet activity did not occur: Safety/medical concerns        Walk 50 feet activity   Assist Walk 50 feet with 2 turns activity did not occur: Safety/medical concerns         Walk 150 feet activity   Assist Walk 150 feet activity did not occur: Safety/medical concerns         Walk 10 feet on uneven surface  activity   Assist Walk 10 feet on uneven surfaces activity did not occur: Safety/medical concerns         Wheelchair     Assist Is the patient using a wheelchair?: Yes Type of Wheelchair: Manual    Wheelchair assist level: Total Assistance - Patient < 25% Max wheelchair distance: 10'    Wheelchair 50 feet with 2 turns activity  Assist        Assist Level: Dependent - Patient 0%   Wheelchair 150 feet activity     Assist      Assist Level: Dependent - Patient 0%   Blood pressure (!) 124/55, pulse 95, temperature 98.5 F (36.9 C), resp. rate 18, height '5\' 5"'$  (1.651 m), weight 78.1 kg, SpO2 97 %.    Medical Problem List and Plan: 1. 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  Impaired mobility, working on transfers -Continue Lovenox             -antiplatelet therapy: Aspirin 81 mg daily and Plavix 75 mg day x3 to 6 months 3. Pain Management: Tylenol as needed 4. Anxiety: continue Xanax 0.25-0.5 mg nightly as needed             -antipsychotic agents: N/A 5. Neuropsych/cognition: This patient is capable of making decisions on her own behalf. 6. Skin/Wound Care: Routine skin checks 7. Fluids/Electrolytes/Nutrition: Routine in and outs with follow-up chemistries 8.  Left frontal large meningioma.  Follow-up neurosurgery Dr. Ellene Route.  Plan for possible surgical resection 8-12 weeks.   -per NS decadron tapered to '2mg'$  q12 x 3 days, completes 7/23 9.  Hypertension.  improved, continue Norvasc 10 mg daily, maintain avapro, d/c hydralazine  -improving with steroid taper  -Fair control overall,  continue current medications 10.  Diabetes mellitus.  Hemoglobin A1c 7.0.   -cbg's improving increase Semglee to 37 units daily  -  d/c ISS Well controlled, decrease CBG checks to AC BID -may need further adjustment after steroids dc  CBG (last 3)  Recent Labs    11/21/21 2044 11/22/21 0532 11/22/21 1127  GLUCAP 274* 166* 180*     8/1 Labile CBG, will consult DM coordinator  8/2 Decrease Semglee to 32u, restart 0-6 unit correctional insulin  8/3 Monitor CBG trend, appears more stable overall 11.  Hyperlipidemia.  Fenofibrate/Crestor 12.  CKD stage III.  Latest creatinine 1.4 on 11/12/21.  Baseline 1.22-1.69.   -Scr  1.34, Bun 37, stable overall 13.  History of endometrial cancer.  Follow-up outpatient 14.  GERD.  continue Protonix 15. Vasovagal episode 7/18: placed teds hose on only with therapy, discussed with therapy 16. Overweight BMI 29.72: provide dietary  education 17. 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 This has been discussed with son who is a retired Social research officer, government. Discussed plans for menigioma resection in a few weeks with son. 18. Dysphagia: plan for MBS next week -MBS 8/2 SLP continuing Dysphagia 3 diet with nectar thick liquids, Water protocol started with ice chips and thin water via tsp sips only between meals and following meticulous oral care 19. Neurogenic bowel and bladder: bladder program 20. Hard of hearing: needs hearing aides, will need to ask son to bring from home.  21. Increased lethargy: improving, Stat CH and routine MRI brain without contrast ordered. Shows small increase in prior stroke, discussed with  son and neurology. WBC reviewed and within normal limits but UA was positive, treat UTI as below -8/2 Start ritalin '5mg'$  BID for activation 22. +UA with history of ESBL no dysuria or fever : ceftriaxone started, UC with 80,000 gram negative rods, f/u sensitivities- Ceftriaxone not tested S to Cefepime will switch, UCX with pseudomonas  -Dysuria improved, will plan to continue cefepime for 7 days 23. Hyponatremia, appears somewhat chronic, recheck tomorrow,  increase salt intake   LOS: 17 days A FACE TO FACE EVALUATION WAS PERFORMED  Jennye Boroughs 11/22/2021, 12:20 PM

## 2021-11-22 NOTE — Progress Notes (Signed)
Physical Therapy Session Note  Patient Details  Name: Cassandra Dodson MRN: [id number] Date of Birth: [birth year]  Today's Date: 11/22/2021 PT Individual Time: 1500-1525 PT Individual Time Calculation (min): 25 min   Short Term Goals: Week 3:  PT Short Term Goal 1 (Week 3): STG = LTG due to ELOS  Skilled Therapeutic Interventions/Progress Updates:     0800 Pt soundly asleep in bed on arrival. She responds to voice but unable to wake enough to actively participate in therapy. Will reattempt as schedule allows.  1500 Pt sitting in TIS w/c, awake and alert, agreeable to therapy session even though she reports "go away" upon entrance. Able to be easily redirected. Noted deck of cards on table - directed her in card game which she was agreeable to. Played game of "Go-Fish" to work on problem solving, following directions, sequencing, and awareness. Pt able to sustain attention for 1-2 minute intervals but was frequently distracted from staff/patients walking past her room. Pt asking "was that a spy?" (Referring to EMT personnel). Pt assisted back to bed via modA squat<>pivot transfer, going towards her weaker L side. Required maxA for sit>supine into bed and assist for repositioning as well. Remained in bed with bed alarm on, call bell in reach, all needs met.   Therapy Documentation Precautions:  Precautions Precautions: Fall Precaution Comments: L hemi, incontinent Restrictions Weight Bearing Restrictions: No General:    Therapy/Group: Individual Therapy  Cassandra Dodson 11/22/2021, 7:37 AM

## 2021-11-22 NOTE — Progress Notes (Signed)
Occupational Therapy Session Note  Patient Details  Name: AOLANIS CRISPEN MRN: [id number] Date of Birth: 1940/07/14  Today's Date: 11/22/2021 OT Individual Time: 1753-0104 OT Individual Time Calculation (min): 57 min    Short Term Goals: Week 2:  OT Short Term Goal 1 (Week 2): Patient will bathe upper body with min assist following set up OT Short Term Goal 1 - Progress (Week 2): Partly met (Not yet consistent) OT Short Term Goal 2 (Week 2): Patient will don pull over shirt with min assist OT Short Term Goal 2 - Progress (Week 2): Met OT Short Term Goal 3 (Week 2): Patient will trasnfer to/from commode with mod assist OT Short Term Goal 3 - Progress (Week 2): Met OT Short Term Goal 4 (Week 2): Patient will pull pants up/down over hips with mod assist OT Short Term Goal 4 - Progress (Week 2): Not met OT Short Term Goal 5 (Week 2): Patient will visualy scan to left to locate needed hygiene or food items OT Short Term Goal 5 - Progress (Week 2): Met  Skilled Therapeutic Interventions/Progress Updates:    Pt seated in w/c upon arrival. Pt incontinent of bowel with no awareness. Sit<>stand in [place] with min A. Subsequent sit<>stand deteriorated to max A as pt fatigued. Pt dependent for toileting tasks. Pt with difficulty maintaining upright posture when standing in [place] and preferred to lean her head against OTA. Pt required max multimodal cues to stand upright with min A. Pt returned to bed and had large emesis. NT present and assistance with cleaning. Pt incontinent of bladder. Pt dependent for all cleaning. Rolling in bed with mod A. Pt with minimal awareness of need to clean. Pt remained in bed with NT and LPN present.   Therapy Documentation Precautions:  Precautions Precautions: Fall Precaution Comments: L hemi, incontinent Restrictions Weight Bearing Restrictions: No  Pain:  Pt denies pain this morning   Therapy/Group: Individual Therapy  Leroy Libman 11/22/2021, 12:29  PM

## 2021-11-22 NOTE — Progress Notes (Signed)
Speech Language Pathology Daily Session Note  Patient Details  Name: Cassandra Dodson MRN: [id number] Date of Birth: 06-18-1940  Today's Date: 11/22/2021 SLP Individual Time: 3244-0102 SLP Individual Time Calculation (min): 40 min  Short Term Goals: Week 3: SLP Short Term Goal 1 (Week 3): STG's = LTG's due to ELOS (8/8)  Skilled Therapeutic Interventions: Skilled ST treatment focused on cognitive and swallowing goals. Upon arrival pt requested SLP for some coca-cola sitting on bedside stand. SLP reviewed results of MBS yesterday with ongoing concerns for decreased airway protection and recommendations for dysphagia 3 textures with nectar thick liquids. Pt agreeable to SLP thickening coca-cola to nectar thick consistency and consumed trials by cup and straw without overt s/sx of aspiration. Continued to require max A verbal explanation regarding swallowing needs throughout session.   SLP facilitated orientation to time with min A, oriented to location and situation given sup A verbal cues. Facilitated basic problem solving task for 12 minute intervals given min A verbal redirection cues, and min-to-mod A to identify and problem solve various scenarios on 7-day, large print weather forecast. Pt benefited from reading glasses for visual acuity.   Patient was left in wheelchair with alarm activated and immediate needs within reach at end of session. Continue per current plan of care.      Pain  None/denied  Therapy/Group: Individual Therapy  Patty Sermons 11/22/2021, 3:37 PM

## 2021-11-23 LAB — BASIC METABOLIC PANEL
Anion gap: 5 (ref 5–15)
BUN: 31 mg/dL — ABNORMAL HIGH (ref 8–23)
CO2: 26 mmol/L (ref 22–32)
Calcium: 8.8 mg/dL — ABNORMAL LOW (ref 8.9–10.3)
Chloride: 103 mmol/L (ref 98–111)
Creatinine, Ser: 1.02 mg/dL — ABNORMAL HIGH (ref 0.44–1.00)
GFR, Estimated: 55 mL/min — ABNORMAL LOW (ref >60)
Glucose, Bld: 76 mg/dL (ref 70–99)
Potassium: 4.4 mmol/L (ref 3.5–5.1)
Sodium: 134 mmol/L — ABNORMAL LOW (ref 135–145)

## 2021-11-23 LAB — GLUCOSE, CAPILLARY
Glucose-Capillary: 175 mg/dL — ABNORMAL HIGH (ref 70–99)
Glucose-Capillary: 158 mg/dL — ABNORMAL HIGH (ref 70–99)
Glucose-Capillary: 205 mg/dL — ABNORMAL HIGH (ref 70–99)

## 2021-11-23 MED ORDER — SODIUM CHLORIDE 0.9 % IV SOLN: INTRAVENOUS | Status: DC | PRN

## 2021-11-23 MED ORDER — INSULIN GLARGINE-YFGN 100 UNIT/ML ~~LOC~~ SOLN
34.0000 [IU] | Freq: Every day | SUBCUTANEOUS | Status: DC
  Administered 2021-11-24 – 2021-11-26 (×3): 34 [IU] via SUBCUTANEOUS
  Filled 2021-11-23 (×4): qty 0.34

## 2021-11-23 NOTE — Progress Notes (Signed)
Patient ID: Cassandra Dodson, female   DOB: [birth year], 81 y.o.   MRN: [id number]  Son was here for hands on family education and it went well according to team he does not need to come back on Monday for more. Discussed hospital bed and hoyer lift have made referral to [employer] and they have already reached out to son to set up delivery for Monday. Son is aware of pt's need for 24/7 care and plans on providing this along with others he has recruited. Will give him a private duty list in case he needs to hire assist. Will continue to work on discharge for Tuesday

## 2021-11-23 NOTE — Progress Notes (Signed)
Occupational Therapy Session Note  Patient Details  Name: Cassandra Dodson MRN: [id number] Date of Birth: Nov 21, 1940  Today's Date: 11/23/2021 OT Individual Time: 6948-5462 OT Individual Time Calculation (min): 45 min    Short Term Goals: Week 3:  OT Short Term Goal 1 (Week 3): Patient will dress lower body with mod assist OT Short Term Goal 2 (Week 3): Patient will feed herself with supervision and verbal cueing OT Short Term Goal 3 (Week 3): Patient will sit without support x 2 min in preparation for a level surface transfer OT Short Term Goal 4 (Week 3): Patient will transfer to commode with mod assist 3/5 trials OT Short Term Goal 5 (Week 3): Patient will actively attend to familiar functional task in quiet environment x 5 min  Skilled Therapeutic Interventions/Progress Updates:    Patient received supine in bed - son, [name] in for training.  Patient agreeable to get up out of bed, and having [name] help her.  Demonstrated LB care at bedside - patient using hospital bed features to roll left and right.  Patient able to bridge to assist with pulling pants over hips. Patient assisted to sitting at edge of bed with mod assist.  Patient transferred bed to wheelchair multiple scoots with mod assist - demonstrated this for son - so he would have a sense of physical load.  Patient completed grooming, bathing and hygiene at sink.  Son encouraging.  Discussed benefit of having patient do as much as feasible to build her strength.  Discussed the level of variability depending on medical and attentional issues.  Son indicated understanding.  Son hopeful for full return of function - stressed that patient has undergone a great deal since 7/4, and that this situation is very different from prior surgeries - hip and knee.  Son very comfortable providing personal care, and has additional support lined up for her care.  Patient left up in wheelchair with son at bedside.    Therapy Documentation Precautions:   Precautions Precautions: Fall Precaution Comments: L hemi, incontinent Restrictions Weight Bearing Restrictions: No  Pain Assessment Pain Scale: 0-10 Pain Score: 0-No pain    Therapy/Group: Individual Therapy  Mariah Milling 11/23/2021, 12:45 PM

## 2021-11-23 NOTE — Plan of Care (Signed)
  Problem: Consults Goal: RH STROKE PATIENT EDUCATION Description: See Patient Education module for education specifics  Outcome: Progressing   Problem: RH BOWEL ELIMINATION Goal: RH STG MANAGE BOWEL WITH ASSISTANCE Description: STG Manage Bowel with mod I Assistance. Outcome: Progressing Goal: RH STG MANAGE BOWEL W/MEDICATION W/ASSISTANCE Description: STG Manage Bowel with Medication with  mod I Assistance. Outcome: Progressing   Problem: RH BLADDER ELIMINATION Goal: RH STG MANAGE BLADDER WITH ASSISTANCE Description: STG Manage Bladder With toileting Assistance Outcome: Progressing   Problem: RH SKIN INTEGRITY Goal: RH STG MAINTAIN SKIN INTEGRITY WITH ASSISTANCE Description: STG Maintain Skin Integrity With min Assistance. Outcome: Progressing   Problem: RH SAFETY Goal: RH STG ADHERE TO SAFETY PRECAUTIONS W/ASSISTANCE/DEVICE Description: STG Adhere to Safety Precautions With cues Assistance/Device. Outcome: Progressing   Problem: RH PAIN MANAGEMENT Goal: RH STG PAIN MANAGED AT OR BELOW PT'S PAIN GOAL Description: At or below level 4 with prns Outcome: Progressing   Problem: RH KNOWLEDGE DEFICIT Goal: RH STG INCREASE KNOWLEDGE OF DIABETES Description: Patient and son will be able to manage DM with medications and dietary modifications using handouts and educational resources independently Outcome: Progressing Goal: RH STG INCREASE KNOWLEDGE OF HYPERTENSION Description: Patient and son will be able to manage HTN with medications and dietary modifications using handouts and educational resources independently Outcome: Progressing Goal: RH STG INCREASE KNOWLEDGE OF DYSPHAGIA/FLUID INTAKE Description: Patient and son will be able to manage Dysphagia,. medications and dietary modifications using handouts and educational resources independently Outcome: Progressing Goal: RH STG INCREASE KNOWLEGDE OF HYPERLIPIDEMIA Description: Patient and son will be able to manage HLD with  medications and dietary modifications using handouts and educational resources independently Outcome: Progressing Goal: RH STG INCREASE KNOWLEDGE OF STROKE PROPHYLAXIS Description: Patient and son will be able to manage secondary risks with medications and dietary modifications using handouts and educational resources independently Outcome: Progressing

## 2021-11-23 NOTE — Progress Notes (Signed)
PROGRESS NOTE   Subjective/Complaints:  She is eating breakfast. Denies pain. No new concerns today.  LBM today  ROS: denies pain, +urinary incontinence, denies cough, abdominal pain, no CP or SOB, no palpitations  Objective:   No results found. No results for input(s): "WBC", "HGB", "HCT", "PLT" in the last 72 hours.    Recent Labs    11/21/21 1327 11/23/21 0720  NA 130* 134*  K 4.7 4.4  CL 99 103  CO2 22 26  GLUCOSE 265* 76  BUN 37* 31*  CREATININE 1.34* 1.02*  CALCIUM 9.2 8.8*       Intake/Output Summary (Last 24 hours) at 11/23/2021 1056 Last data filed at 11/23/2021 0858 Gross per 24 hour  Intake 716 ml  Output --  Net 716 ml      Pressure Injury 11/20/21 Sacrum Mid Stage 2 -  Partial thickness loss of dermis presenting as a shallow open injury with a red, pink wound bed without slough. (Active)  11/20/21 1217  Location: Sacrum  Location Orientation: Mid  Staging: Stage 2 -  Partial thickness loss of dermis presenting as a shallow open injury with a red, pink wound bed without slough.  Wound Description (Comments):   Present on Admission: No    Physical Exam: Vital Signs Blood pressure 129/60, pulse 82, temperature 98.7 F (37.1 C), resp. rate 18, height '5\' 5"'$  (1.651 m), weight 78.1 kg, SpO2 95 %.  General: No acute distress, sitting in bed Mood and affect are appropriate Heart: Regular rate and rhythm no rubs murmurs or extra sounds Lungs: Clear to auscultation, breathing unlabored, no rales or wheezes Abdomen: Positive bowel sounds, soft nontender to palpation, nondistended Extremities: No clubbing, cyanosis, or edema  Skin: mild stage 1 on sacrum, otherwise warm and dry Neurological:     Comments: Alert, slow to process.  Mild dysarthria.  Oriented to self and place.  Follows simple commands.   HOH. Cranial nerves II through XII intact, motor strength is 5/5 in RIght and 4- Left deltoid,  bicep, tricep, grip,5/5 Right 0/5 left side limited by current lethargy Sensory exam normal sensation to light touch and proprioception in bilateral upper and lower extremities Self feeds with MinA   Assessment/Plan: 1. Functional deficits which require 3+ hours per day of interdisciplinary therapy in a comprehensive inpatient rehab setting. Physiatrist is providing close team supervision and 24 hour management of active medical problems listed below. Physiatrist and rehab team continue to assess barriers to discharge/monitor patient progress toward functional and medical goals  Care Tool:  Bathing    Body parts bathed by patient: Left arm, Face, Chest   Body parts bathed by helper: Right arm, Abdomen, Front perineal area, Buttocks, Right upper leg, Left upper leg, Right lower leg, Left lower leg     Bathing assist Assist Level: Maximal Assistance - Patient 24 - 49%     Upper Body Dressing/Undressing Upper body dressing   What is the patient wearing?: Pull over shirt    Upper body assist Assist Level: Moderate Assistance - Patient 50 - 74%    Lower Body Dressing/Undressing Lower body dressing      What is the patient wearing?: Pants, Incontinence  brief     Lower body assist Assist for lower body dressing: Maximal Assistance - Patient 25 - 49%     Toileting Toileting    Toileting assist Assist for toileting: Dependent - Patient 0%     Transfers Chair/bed transfer  Transfers assist  Chair/bed transfer activity did not occur: Safety/medical concerns  Chair/bed transfer assist level: Moderate Assistance - Patient 50 - 74%     Locomotion Ambulation   Ambulation assist      Assist level: Maximal Assistance - Patient 25 - 49% Assistive device: Parallel bars Max distance: 17f   Walk 10 feet activity   Assist  Walk 10 feet activity did not occur: Safety/medical concerns        Walk 50 feet activity   Assist Walk 50 feet with 2 turns activity did not  occur: Safety/medical concerns         Walk 150 feet activity   Assist Walk 150 feet activity did not occur: Safety/medical concerns         Walk 10 feet on uneven surface  activity   Assist Walk 10 feet on uneven surfaces activity did not occur: Safety/medical concerns         Wheelchair     Assist Is the patient using a wheelchair?: Yes Type of Wheelchair: Manual    Wheelchair assist level: Total Assistance - Patient < 25% Max wheelchair distance: 10'    Wheelchair 50 feet with 2 turns activity    Assist        Assist Level: Dependent - Patient 0%   Wheelchair 150 feet activity     Assist      Assist Level: Dependent - Patient 0%   Blood pressure 129/60, pulse 82, temperature 98.7 F (37.1 C), resp. rate 18, height '5\' 5"'$  (1.651 m), weight 78.1 kg, SpO2 95 %.    Medical Problem List and Plan: 1. 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  Impaired mobility, working on transfers -Continue Lovenox             -antiplatelet therapy: Aspirin 81 mg daily and Plavix 75 mg day x3 to 6 months 3. Pain Management: Tylenol as needed 4. Anxiety: continue Xanax 0.25-0.5 mg nightly as needed             -antipsychotic agents: N/A 5. Neuropsych/cognition: This patient is capable of making decisions on her own behalf. 6. Skin/Wound Care: Routine skin checks 7. Fluids/Electrolytes/Nutrition: Routine in and outs with follow-up chemistries 8.  Left frontal large meningioma.  Follow-up neurosurgery Dr. EEllene Route  Plan for possible surgical resection  8-12 weeks.   -per NS decadron tapered to '2mg'$  q12 x 3 days, completes 7/23 9.  Hypertension.  improved, continue Norvasc 10 mg daily, maintain avapro, d/c hydralazine  -improving with steroid taper  -Fair control overall,  continue current medications 10.  Diabetes mellitus.  Hemoglobin A1c 7.0.   -cbg's improving increase Semglee to 37 units daily  -  d/c ISS Well controlled,  decrease CBG checks to AC BID -may need further adjustment after steroids dc  CBG (last 3)  Recent Labs    11/22/21 1127 11/22/21 1707 11/22/21 2152  GLUCAP 180* 223* 140*     8/1 Labile CBG, will consult DM coordinator  8/2 Decrease Semglee to 32u, restart 0-6 unit correctional insulin  8/3 Monitor CBG trend, appears more stable overall  8/4 increase Semglee to 34 11.  Hyperlipidemia.  Fenofibrate/Crestor 12.  CKD stage III.  Latest creatinine 1.4 on 11/12/21.  Baseline 1.22-1.69.   -improved 8/4 Scr  1.02, Bun 31, stable overall 13.  History of endometrial cancer.  Follow-up outpatient 14.  GERD.  continue Protonix 15. Vasovagal episode 7/18: placed teds hose on only with therapy, discussed with therapy 16. Overweight BMI 29.72: provide dietary education 17. 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 This has been discussed with son who is a retired Social research officer, government. Discussed plans for menigioma resection in a few weeks with son. 18. Dysphagia: plan for MBS next week -MBS 8/2 SLP continuing Dysphagia 3 diet with nectar thick liquids, Water protocol started with ice chips and thin water via tsp sips only between meals and following meticulous oral care 19. Neurogenic bowel and bladder: bladder program 20. Hard of hearing: needs hearing aides, will need to ask son to bring from home.  21. Increased lethargy: improving, Stat CH and routine MRI brain without contrast ordered. Shows small increase in prior stroke, discussed with son and  neurology. WBC reviewed and within normal limits but UA was positive, treat UTI as below -8/2 Start ritalin '5mg'$  BID for activation 22. +UA with history of ESBL no dysuria or fever : ceftriaxone started, UC with 80,000 gram negative rods, f/u sensitivities- Ceftriaxone not tested S to Cefepime will switch, UCX with pseudomonas  -Dysuria improved, will plan to continue cefepime for 7 days- last day 8/6 23. Hyponatremia  -improved to 134 8/4, recheck monday   LOS: 18 days A FACE TO FACE EVALUATION WAS PERFORMED  Jennye Boroughs 11/23/2021, 10:56 AM

## 2021-11-23 NOTE — Progress Notes (Addendum)
Speech Language Pathology Daily Session Note  Patient Details  Name: Cassandra Dodson MRN: [id number] Date of Birth: 17-Jun-1940  Today's Date: 11/23/2021 SLP Individual Time: 1120-1200 (40 minutes)    Short Term Goals: Week 3: SLP Short Term Goal 1 (Week 3): STG's = LTG's due to ELOS (8/8)  Skilled Therapeutic Interventions: S: Pt seen this date for skilled ST intervention targeting deglutition and cognitive-linguistic goals outlined in care plan. Pt received awake/alert and OOB in TIS. Son present for family education. Reports discomfort at sacral area; therefore, transferred back to bed at the end of today's session with [facility] + 2 for safety. Agreeable to ST intervention in hospital room.   O: SLP facilitated today's session by providing Min A for oral care via toothbrush at sink level. Total A for administration of thin water via tsp sips. Overall, good tolerance of thin water via tsp with no clinical s/sx concerning for aspiration/airway invasion. Pt's son educated on MBSS results via showing MBSS video, diet recommendations, how to thicken liquids to nectar-thick consistency, water protocol instructions (thin water via tsp only - which he demonstrated understanding of this date), importance of oral care, and cognitive-linguistic strategies to facilitate attention, problem-solving, and recall + implementation of safe swallowing strategies. Pt and pt's son verbalized understanding of information, though will need ongoing training which is planned for 8/7. Required Min A verbal cues during verbal problem-solving when completing functional ADL tasks to include transferring from TIS to bed with Stedy + 2 and log rolling during brief change (incontinent of urine). Pt asked if she needed to use the bathroom prior to laying down, which she said no, then when in bed said "yes" and went in her brief.  A: Pt somewhat stimulable for skilled ST intervention. Continue to recommend current diet of Dysphagia 3 with  nectar-thick liquid and water protocol vis tsp only. Recommend 24/7 supervision and assistance at time of discharge, as well as [place] and home suctioning kit. Pt's son reports [facility] therapies have already been set up. Son verbalizes understanding of all information and education.   P: Pt left in bed with all safety measures activated. Call bell reviewed and within reach and all immediate needs met. Continue per current ST POC.  Pain Sacral discomfort sitting in TIS  Therapy/Group: Individual Therapy  Lashun Mccants A Kassadee Carawan 11/23/2021, 5:16 PM

## 2021-11-23 NOTE — Progress Notes (Signed)
Physical Therapy Session Note  Patient Details  Name: Cassandra Dodson MRN: [id number] Date of Birth: Apr 14, 1941  Today's Date: 11/23/2021 PT Individual Time: 1030-1115 PT Individual Time Calculation (min): 45 min   Short Term Goals: Week 3:  PT Short Term Goal 1 (Week 3): STG = LTG due to ELOS  Skilled Therapeutic Interventions/Progress Updates:      Pt sitting in TIS w/c to start. Her son at the bedside for scheduled family education/training. Reviewed PT POC, PT goals, pt's primary deficits related to her CVA, her tolerance with therapies, bowel/bladder incontinence, home safety, fall prevention, DME rec's, and role of f/u therapies. Son verbalized understanding of all. Retrieved manual Hoyer lift and pad. Transported patient to main rehab gym with son present. Reviewed functions/management of hoyer lift including brakes, valve release, etc. Son assisted with [place] transfer from [place] to mat table and then mat table back to TIS. PT providing supervision with son managing equipment. Son reports previous exposure/experience with [facility] from assisting family member in the past. Son verbalizes confidence and comfort regarding this method of transfer as well as managing patient as a whole at home for discharge. Discussed recommendations for non-emergent medical transport home - son declines and reports he and his fellow "marine medic" will assist without concern. Son reports he "lifted" his mother into the car for her hospitalization and can do it again if needed. Relayed to CSW for DC planning. Pt concluded session in TIS w/c with son at bedside, all needs met.   Therapy Documentation Precautions:  Precautions Precautions: Fall Precaution Comments: L hemi, incontinent Restrictions Weight Bearing Restrictions: No General:     Therapy/Group: Individual Therapy  Alger Simons 11/23/2021, 7:29 AM

## 2021-11-24 LAB — GLUCOSE, CAPILLARY
Glucose-Capillary: 69 mg/dL — ABNORMAL LOW (ref 70–99)
Glucose-Capillary: 127 mg/dL — ABNORMAL HIGH (ref 70–99)
Glucose-Capillary: 107 mg/dL — ABNORMAL HIGH (ref 70–99)
Glucose-Capillary: 161 mg/dL — ABNORMAL HIGH (ref 70–99)
Glucose-Capillary: 208 mg/dL — ABNORMAL HIGH (ref 70–99)

## 2021-11-24 NOTE — Progress Notes (Signed)
Occupational Therapy Session Note  Patient Details  Name: Cassandra Dodson MRN: [id number] Date of Birth: 05/28/1940  Today's Date: 11/24/2021   Upon OT arrival, pt semi recumbent in bed. Therapist introduced self and pt instantly states "I'm not doing it" Therapist attempts to educate pt on importance of participating in therapy services and pt continues to decline therapy session this date. Pt missed 45 minutes of OT treatment time.       Jolan Upchurch 11/24/2021, 2:22 PM

## 2021-11-24 NOTE — Progress Notes (Signed)
Patient present with blood glucose of 69. MD made aware. Patient was given breakfast tray this morning and sliding scale insulin held as ordered. Patient at 100% of breakfast. No signs or symptoms of hypoglycemia. Sanda Linger, RN

## 2021-11-24 NOTE — Progress Notes (Signed)
Physical Therapy Session Note  Patient Details  Name: Cassandra Dodson MRN: [id number] Date of Birth: 06-05-1940  Today's Date: 11/24/2021 PT Individual Time: 0935-1006 PT Individual Time Calculation (min): 31 min   and  Today's Date: 11/24/2021 PT Missed Time: 14 Minutes Missed Time Reason: Other (Comment) (low blood sugar)  Short Term Goals: Week 3:  PT Short Term Goal 1 (Week 3): STG = LTG due to ELOS  Skilled Therapeutic Interventions/Progress Updates:    Pt received supported upright in bed with nurse tech present assisting pt with breakfast and MD exiting. MD reports pt had hypoglycemic event this AM and requests pt has time to eat breakfast prior to participating in therapy session. Therapist to return at later time. Missed 14 minutes of skilled physical therapy.  Upon therapist return, pt supine in bed awake and agreeable to therapy session. Therapist has student PT present throughout session. Nurse reports pt ate sufficient amount of meal for BS to be recovered. Supine>sitting L EOB, HOB partially elevated and using bedrail, with min assist for L LE management and trunk upright - pt with slow initiation and requires cuing for encouragement to continue performing mobility task and to perform as much as possible without assistance - cuing for sequencing and to reach R UE across to rotate shoulders using bedrail. Sitting EOB with close supervision/CGA for safety.  Pt noted to be incontinent of bowels in brief. Decided to perform stedy transfer to [facility] over toilet to complete peri-care to target strengthening B LE, activity tolerance, and standing tolerance and balance.  Sit>stand from elevated EOB>stedy with light mod assist for lifting to stand. Stedy transfer into the bathroom.   Sit>stand from stedy seat with heavy min assist, but pt unable to tolerate standing long enough for therapist to completely remove soiled brief therefore requires mod assist to come back to partial stand to doff it  fully by +2 assist.   Pt unable to void further on toilet therefore returned to standing in stedy with mod assist for lifting hips from [facility], but pt continues to be unable to tolerate standing for >5 seconds in order for therapist to perform peri-hygiene therefore provided stedy seat support. Therapist performing anterior peri-care while pt in high-perched sitting on stedy seat with pt reporting increasing fatigue.   Pt starts to lean trunk forward to allow therapist to attempt posterior peri-care. While pt leaning forward, she suddenly slumps over towards the L with inability to hold herself up causing her neck to lie on the stedy bar with pt becoming unconscious for <5 seconds. Therapist quickly provided pt trunk support to return her to upright posture and she awakened in <5 seconds with +2 assistance present for safety - pt quickly transported back to EOB in stedy with +2 assist.  Pt unable to report any specific symptoms but is aware that she seemed to have briefly passed out - pt unable to report whether she was attempting to void during that time.   Sit>stand from stedy seat with mod assist for lifting to stand for safety and urgency to quickly return pt to supine for safety - +2 assist to manage stedy.   Sit>supine with heavy mod assist for trunk descent and B LE management onto bed to allow quick transition for pt safety - pt alert/awake during this.  Nurse notified and vitals assessed to be WNL (systolic in 416S) and nurse also assessed blood sugar to be 127.  Nurse made aware of the situation and pt left supine in  bed in the care of nursing with pt awake and verbally responding to therapist with no signs of distress. Pt with no observable change in functional status after episode in the bathroom.   Therapy Documentation Precautions:  Precautions Precautions: Fall Precaution Comments: L hemi, incontinent Restrictions Weight Bearing Restrictions: No   Pain:  No reports of pain  throughout session.   Therapy/Group: Individual Therapy  Tawana Scale , PT, DPT, NCS, CSRS 11/24/2021, 7:54 AM

## 2021-11-24 NOTE — Plan of Care (Signed)
  Problem: Consults Goal: RH STROKE PATIENT EDUCATION Description: See Patient Education module for education specifics  Outcome: Progressing   Problem: RH BOWEL ELIMINATION Goal: RH STG MANAGE BOWEL WITH ASSISTANCE Description: STG Manage Bowel with mod I Assistance. Outcome: Progressing Goal: RH STG MANAGE BOWEL W/MEDICATION W/ASSISTANCE Description: STG Manage Bowel with Medication with  mod I Assistance. Outcome: Progressing   Problem: RH BLADDER ELIMINATION Goal: RH STG MANAGE BLADDER WITH ASSISTANCE Description: STG Manage Bladder With toileting Assistance Outcome: Progressing   Problem: RH SKIN INTEGRITY Goal: RH STG MAINTAIN SKIN INTEGRITY WITH ASSISTANCE Description: STG Maintain Skin Integrity With min Assistance. Outcome: Progressing   Problem: RH SAFETY Goal: RH STG ADHERE TO SAFETY PRECAUTIONS W/ASSISTANCE/DEVICE Description: STG Adhere to Safety Precautions With cues Assistance/Device. Outcome: Progressing   Problem: RH PAIN MANAGEMENT Goal: RH STG PAIN MANAGED AT OR BELOW PT'S PAIN GOAL Description: At or below level 4 with prns Outcome: Progressing   Problem: RH KNOWLEDGE DEFICIT Goal: RH STG INCREASE KNOWLEDGE OF DIABETES Description: Patient and son will be able to manage DM with medications and dietary modifications using handouts and educational resources independently Outcome: Progressing Goal: RH STG INCREASE KNOWLEDGE OF HYPERTENSION Description: Patient and son will be able to manage HTN with medications and dietary modifications using handouts and educational resources independently Outcome: Progressing Goal: RH STG INCREASE KNOWLEDGE OF DYSPHAGIA/FLUID INTAKE Description: Patient and son will be able to manage Dysphagia,. medications and dietary modifications using handouts and educational resources independently Outcome: Progressing Goal: RH STG INCREASE KNOWLEGDE OF HYPERLIPIDEMIA Description: Patient and son will be able to manage HLD with  medications and dietary modifications using handouts and educational resources independently Outcome: Progressing Goal: RH STG INCREASE KNOWLEDGE OF STROKE PROPHYLAXIS Description: Patient and son will be able to manage secondary risks with medications and dietary modifications using handouts and educational resources independently Outcome: Progressing

## 2021-11-24 NOTE — Progress Notes (Signed)
Occupational Therapy Session Note  Patient Details  Name: Cassandra Dodson MRN: [id number] Date of Birth: 12-31-1940  Today's Date: 11/24/2021 OT Individual Time: 1030-1115 OT Individual Time Calculation (min): 45 min    Short Term Goals: Week 3:  OT Short Term Goal 1 (Week 3): Patient will dress lower body with mod assist OT Short Term Goal 2 (Week 3): Patient will feed herself with supervision and verbal cueing OT Short Term Goal 3 (Week 3): Patient will sit without support x 2 min in preparation for a level surface transfer OT Short Term Goal 4 (Week 3): Patient will transfer to commode with mod assist 3/5 trials OT Short Term Goal 5 (Week 3): Patient will actively attend to familiar functional task in quiet environment x 5 min  Skilled Therapeutic Interventions/Progress Updates:    Session 1: Upon OT arrival, pt semi recumbent in bed resting. Pt able to be easily roused and was agreeable to OT session with encouragement. Treatment intervention with a focus on self care retraining, activity tolerance, and endurance. Pt completes supine to sit transfer with SBA and sits EOB for majority of session with varying assist of Min A-CGA to complete UB/LB dressing. Pt completes UB dressing with Min A and LB dressing with Max A. Pt requires max verbal cues to sequence and initiate. Pt reports no dizziness during session. Pt completes bridging in bed with Mod difficulty while attempting to manage pants. Pt rolls in bed for pants management with Min A to the L side and Max A to the R side. Pt remains in bed at end of session with all needs met and safety measures in place.   Therapy Documentation Precautions:  Precautions Precautions: Fall Precaution Comments: L hemi, incontinent Restrictions Weight Bearing Restrictions: No   Therapy/Group: Individual Therapy  [name] 11/24/2021, 12:27 PM

## 2021-11-25 LAB — GLUCOSE, CAPILLARY
Glucose-Capillary: 77 mg/dL (ref 70–99)
Glucose-Capillary: 147 mg/dL — ABNORMAL HIGH (ref 70–99)
Glucose-Capillary: 196 mg/dL — ABNORMAL HIGH (ref 70–99)
Glucose-Capillary: 195 mg/dL — ABNORMAL HIGH (ref 70–99)

## 2021-11-25 NOTE — Progress Notes (Addendum)
Occupational Therapy Session Note  Patient Details  Name: Cassandra Dodson MRN: [id number] Date of Birth: 01/07/1941  Today's Date: 11/25/2021 OT Individual Time: 1000-1015 OT Individual Time Calculation (min): 15 min    Short Term Goals:  Week 3:  OT Short Term Goal 1 (Week 3): Patient will dress lower body with mod assist OT Short Term Goal 2 (Week 3): Patient will feed herself with supervision and verbal cueing OT Short Term Goal 3 (Week 3): Patient will sit without support x 2 min in preparation for a level surface transfer OT Short Term Goal 4 (Week 3): Patient will transfer to commode with mod assist 3/5 trials OT Short Term Goal 5 (Week 3): Patient will actively attend to familiar functional task in quiet environment x 5 min  Skilled Therapeutic Interventions/Progress Updates:    Subjective: Pt states "you're nice, but go away".      Objective:  Pt sitting up in TIS w/c, watching tv.  Hesitant but agreeable to talk to OT.  Pt stating she did not sleep last night because of noise in hallway.  Pt reports she already completed bathing and dressing and currently does not need to use bathroom.  OT gently oriented patient to shirt appearing donned backwards.  Pt reported "I dont mind" and politely declined to correct it.  With max encouragement, pt initiated LUE AAROM and educated by therapist on benefits of therex to strengthen LUE and increase ease during ADLs.  Pt completed 5 reps of shoulder AAROM, however she discontinued and gently pushed therapist away.  Pt agreeable to allow therapist to assess UE strength MMT noting 3-/5 shoulder flexion and abduction; 3+/5 elbow flexion and extension; 3+ grip strength. Provided pt with medium resistive sponge and instructed pt on gross gripping HEP.  Call bell in reach, seat alarm on.     Assessment: Pt with very limited participation today due to self limiting behaviors and possible limited awareness into own deficits. LUE weakness due to recent CVA event  inhibiting patients ability to reach overhead in order to complete UB dressing tasks with independence.   Plan: Pt would benefit from further training on LUE strengthening and ADLs.   Therapy Documentation Precautions:  Precautions Precautions: Fall Precaution Comments: L hemi, incontinent Restrictions Weight Bearing Restrictions: No    Therapy/Group: Individual Therapy  Ezekiel Slocumb 11/25/2021, 3:38 PM

## 2021-11-25 NOTE — Progress Notes (Signed)
PROGRESS NOTE   Subjective/Complaints:  Pt reports no issues today or yesterday- working with PT currently.   ROS:  Limited by cognition  Objective:   No results found. No results for input(s): "WBC", "HGB", "HCT", "PLT" in the last 72 hours.    Recent Labs    11/23/21 0720  NA 134*  K 4.4  CL 103  CO2 26  GLUCOSE 76  BUN 31*  CREATININE 1.02*  CALCIUM 8.8*      Intake/Output Summary (Last 24 hours) at 11/25/2021 1349 Last data filed at 11/25/2021 1336 Gross per 24 hour  Intake 703.47 ml  Output --  Net 703.47 ml     Pressure Injury 11/20/21 Sacrum Mid Stage 2 -  Partial thickness loss of dermis presenting as a shallow open injury with a red, pink wound bed without slough. (Active)  11/20/21 1217  Location: Sacrum  Location Orientation: Mid  Staging: Stage 2 -  Partial thickness loss of dermis presenting as a shallow open injury with a red, pink wound bed without slough.  Wound Description (Comments):   Present on Admission: No    Physical Exam: Vital Signs Blood pressure 132/85, pulse 80, temperature 98.8 F (37.1 C), resp. rate 18, height '5\' 5"'$  (1.651 m), weight 78.1 kg, SpO2 97 %.    General: awake, alert, appropriate, sitting up in bed; working with PT;  NAD HENT: conjugate gaze; oropharynx moist CV: regular rate; no JVD Pulmonary: CTA B/L; no W/R/R- good air movement GI: soft, NT, ND, (+)BS Psychiatric: appropriate Neurological: alert Extremities: No clubbing, cyanosis, or edema  Skin: mild stage 1 on sacrum, otherwise warm and dry Neurological:     Comments: Alert, slow to process.  Mild dysarthria.  Oriented to self and place.  Follows simple commands.   HOH. Cranial nerves II through XII intact, motor strength is 5/5 in RIght and 4- Left deltoid, bicep, tricep, grip,5/5 Right 0/5 left side limited by current lethargy Sensory exam normal sensation to light touch and proprioception in  bilateral upper and lower extremities Self feeds with MinA   Assessment/Plan: 1. Functional deficits which require 3+ hours per day of interdisciplinary therapy in a comprehensive inpatient rehab setting. Physiatrist is providing close team supervision and 24 hour management of active medical problems listed below. Physiatrist and rehab team continue to assess barriers to discharge/monitor patient progress toward functional and medical goals  Care Tool:  Bathing    Body parts bathed by patient: Left arm, Face, Chest   Body parts bathed by helper: Right arm, Abdomen, Front perineal area, Buttocks, Right upper leg, Left upper leg, Right lower leg, Left lower leg     Bathing assist Assist Level: Maximal Assistance - Patient 24 - 49%     Upper Body Dressing/Undressing Upper body dressing   What is the patient wearing?: Pull over shirt    Upper body assist Assist Level: Minimal Assistance - Patient > 75%    Lower Body Dressing/Undressing Lower body dressing      What is the patient wearing?: Pants     Lower body assist Assist for lower body dressing: Maximal Assistance - Patient 25 - 49%     Toileting Toileting  Toileting assist Assist for toileting: Dependent - Patient 0%     Transfers Chair/bed transfer  Transfers assist  Chair/bed transfer activity did not occur: Safety/medical concerns  Chair/bed transfer assist level: Moderate Assistance - Patient 50 - 74%     Locomotion Ambulation   Ambulation assist      Assist level: Maximal Assistance - Patient 25 - 49% Assistive device: Parallel bars Max distance: 88f   Walk 10 feet activity   Assist  Walk 10 feet activity did not occur: Safety/medical concerns        Walk 50 feet activity   Assist Walk 50 feet with 2 turns activity did not occur: Safety/medical concerns         Walk 150 feet activity   Assist Walk 150 feet activity did not occur: Safety/medical concerns         Walk 10  feet on uneven surface  activity   Assist Walk 10 feet on uneven surfaces activity did not occur: Safety/medical concerns         Wheelchair     Assist Is the patient using a wheelchair?: Yes Type of Wheelchair: Manual    Wheelchair assist level: Total Assistance - Patient < 25% Max wheelchair distance: 10'    Wheelchair 50 feet with 2 turns activity    Assist        Assist Level: Dependent - Patient 0%   Wheelchair 150 feet activity     Assist      Assist Level: Dependent - Patient 0%   Blood pressure 132/85, pulse 80, temperature 98.8 F (37.1 C), resp. rate 18, height '5\' 5"'$  (1.651 m), weight 78.1 kg, SpO2 97 %.    Medical Problem List and Plan: 1. 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  Impaired mobility, working on transfers -Continue Lovenox             -antiplatelet therapy: Aspirin 81 mg daily and Plavix 75 mg day x3 to 6 months 3. Pain Management: Tylenol as needed 4. Anxiety: continue Xanax 0.25-0.5 mg nightly as needed             -antipsychotic agents: N/A 5. Neuropsych/cognition: This patient is capable of making decisions on her own behalf. 6. Skin/Wound Care: Routine skin checks 7. Fluids/Electrolytes/Nutrition: Routine in and outs with follow-up chemistries 8.  Left frontal large meningioma.  Follow-up neurosurgery Dr. EEllene Route  Plan for possible surgical resection 8-12 weeks.   -per NS decadron tapered to '2mg'$  q12 x 3 days, completes 7/23 9.  Hypertension.  improved,  continue Norvasc 10 mg daily, maintain avapro, d/c hydralazine  -improving with steroid taper  -8/6- BP controlled- con't regimen 10.  Diabetes mellitus.  Hemoglobin A1c 7.0.   -cbg's improving increase Semglee to 37 units daily  -  d/c ISS Well controlled, decrease CBG checks to AC BID -may need further adjustment after steroids dc  CBG (last 3)  Recent Labs  11/24/21 2028 11/25/21 0651 11/25/21 1142  GLUCAP 208* 77 147*    8/1 Labile CBG, will consult DM coordinator  8/2 Decrease Semglee to 32u, restart 0-6 unit correctional insulin  8/3 Monitor CBG trend, appears more stable overall  8/4 increase Semglee to 34  8/6- CBG 77-208- somewhat labile- will let primary doctor decide if wants to call DM coordinator..  11.  Hyperlipidemia.  Fenofibrate/Crestor 12.  CKD stage III.  Latest creatinine 1.4 on 11/12/21.  Baseline 1.22-1.69.   -improved 8/4 Scr  1.02, Bun 31, stable overall 13.  History of endometrial cancer.  Follow-up outpatient 14.  GERD.  continue Protonix 15. Vasovagal episode 7/18: placed teds hose on only with therapy, discussed with therapy 16. Overweight BMI 29.72: provide dietary education 17. 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 This has been discussed with son who is a retired Social research officer, government. Discussed plans for menigioma resection in a few weeks with son. 18. Dysphagia: plan for MBS next week -MBS 8/2 SLP continuing Dysphagia 3 diet with nectar thick liquids, Water protocol started with ice chips and thin water via tsp sips only between meals and following meticulous oral care 19. Neurogenic bowel and bladder: bladder program 20. Hard of hearing: needs hearing aides, will need to ask son to bring from home.  21. Increased lethargy: improving, Stat CH and routine MRI brain without contrast ordered. Shows small increase in prior stroke, discussed with son and neurology. WBC reviewed and  within normal limits but UA was positive, treat UTI as below -8/2 Start ritalin '5mg'$  BID for activation 22. +UA with history of ESBL no dysuria or fever : ceftriaxone started, UC with 80,000 gram negative rods, f/u sensitivities- Ceftriaxone not tested S to Cefepime will switch, UCX with pseudomonas  -Dysuria improved, will plan to continue cefepime for 7 days- last day 8/6 23. Hyponatremia  -improved to 134 8/4, recheck monday   LOS: 20 days A FACE TO FACE EVALUATION WAS PERFORMED  Lasharon Dunivan 11/25/2021, 1:49 PM

## 2021-11-25 NOTE — Progress Notes (Signed)
Speech Language Pathology Daily Session Note  Patient Details  Name: Cassandra Dodson MRN: [id number] Date of Birth: 11/28/1940  Today's Date: 11/25/2021 SLP Individual Time: 1520-1545 SLP Individual Time Calculation (min): 25 min  Short Term Goals: Week 3: SLP Short Term Goal 1 (Week 3): STG's = LTG's due to ELOS (8/8)  Skilled Therapeutic Interventions:  Pt was seen for skilled ST targeting dysphagia goals.  SLP facilitated the session with trials of thin liquids via teaspoon following oral care to continue working towards diet progression.  Pt demonstrated no overt s/s of aspiration during trials with supervision for use of swallowing precautions.  Pt was left in bed with bed alarm set and call bell within reach.  Continue per current plan of care.    Pain Pain Assessment Pain Scale: 0-10 Pain Score: 0-No pain  Therapy/Group: Individual Therapy'  PageSelinda Orion 11/25/2021, 4:57 PM

## 2021-11-25 NOTE — Progress Notes (Signed)
Physical Therapy Session Note  Patient Details  Name: Cassandra Dodson MRN: [id number] Date of Birth: 25-Jun-1940  Today's Date: 11/25/2021 PT Individual Time: 4497-5300 + 1300-1340 PT Individual Time Calculation (min): 41 min  + 40 min  Short Term Goals: Week 3:  PT Short Term Goal 1 (Week 3): STG = LTG due to ELOS  Skilled Therapeutic Interventions/Progress Updates:      1st session: Pt supine in bed to start with LPN at bedside. Pt reports "go away" on entrance but is easily redirectable. She denies any pain during session. LPN administering Rx via applesauce.   Pt reporting she needs changed due to soiled brief - improved awareness compared to prior sessions. Brief change completed at bed level with rolling in bed multiple times with as little as minA using bed rails but sometimes requiring modA due to fatigue. Pt incontinent of bladder, charted. Donned pants at bed level as well with dependant assist via rolling in bed.   Supine there-ex completed: -1x8 SLR on R with cues for initiation and effort -1x8 SLR on L with AAROM required for full ROM, slowed on L compared to R -1x8 hip abd on R  -1x8 hip abd on L with AAROM required -1x15 ankle pumps bilaterally, decreased ROM on L with active assist at end range -1x8 heel slides on R -1x8 heel slides on L with AAROM required  Seated there-ex completed: -1x8 LAQ bilaterally -1x8 hip marches bilaterally -1x8 arm raises over head bilaterally (limited on L)  Supine<>sitting EOB requiring mod/maxA for trunk support and BLE management. She's able to sit EOB with CGA while unsupported, maxA required for forward scooting to EOB To achieve feet flat. SB transfer completed with maxA from EOB to w/c, towards her weaker L side. TotalA required for repositioning hips in [place] chair. Reclined for safety/comfort at end of session, belt alarm on, call bell in lap.    2nd session; Pt in TIS w/c to start - reports "are you therapy? If you are, go away."  She also reports pain in her buttock/sacrum, likely related to stage 1 pressure sore.   Pt redirectable and transported in w/c to main rehab gym to be setup in standing frame to work on standing tolerance and pressure relief in standing. She completed x3 stands total with dependant assist from frame, standing tolerance ~3 minutes per stand with mod cues for upright posture and postural awareness. For each stand, used card games in standing for distraction and to work on cognition for sequencing#'s and grouping colors. During the last stand, pt sitting without warning in the standing frame and she required maxA for repositioning in chair for safety concern.   Pt returned to her room in w/c and used Stedy for safety to transfer her back to bed. MaxA for standing to [facility] and minA for standing from perched position. TotalA for sit>supine for trunk and BLE management. Concluded session in bed with heels floated, bed alarm on , all needs met.     Therapy Documentation Precautions:  Precautions Precautions: Fall Precaution Comments: L hemi, incontinent Restrictions Weight Bearing Restrictions: No General:      Therapy/Group: Individual Therapy  Alger Simons 11/25/2021, 7:38 AM

## 2021-11-25 NOTE — Discharge Summary (Signed)
Physical Therapy Discharge Summary  Patient Details  Name: Cassandra Dodson MRN: [id number] Date of Birth: December 15, 1940  Today's Date: 11/26/2021 PT Individual Time: 2355-7322 PT Individual Time Calculation (min): 47 min    Patient has met 3 of 6 long term goals due to improved activity tolerance, improved balance, increased strength, and improved awareness.  Patient to discharge at a wheelchair level Max Assist.   Patient's care partner is independent to provide the necessary physical and cognitive assistance at discharge.  Reasons goals not met: Pt requiring maxA for bed mobility due to weakness and cognitive deficits. She required maxA for dynamic standing balance. Pt unable to meet car transfer goal - recommended ambulance transport home but son refusing.   Recommendation:  Patient will benefit from ongoing skilled PT services in home health setting to continue to advance safe functional mobility, address ongoing impairments in bed mobility, functional transfers, caregiver training, home safety, and minimize fall risk.  Equipment: Custom TIS wheelchair through [employer]. Hoyer lift. Hospital bed  Reasons for discharge: lack of progress toward goals and discharge from hospital  Patient/family agrees with progress made and goals achieved: Yes  PT Discharge Precautions/Restrictions Precautions Precautions: Fall Precaution Comments: L hemi, incontinent Restrictions Weight Bearing Restrictions: No Pain Interference Pain Interference Pain Effect on Sleep: 2. Occasionally Pain Interference with Therapy Activities: 2. Occasionally Pain Interference with Day-to-Day Activities: 2. Occasionally Vision/Perception  Vision - History Ability to See in Adequate Light: 1 Impaired Perception Perception: Impaired Inattention/Neglect: Does not attend to left side of body Praxis Praxis: Impaired Praxis Impairment Details: Motor planning;Initiation  Cognition Overall Cognitive Status:  Impaired/Different from baseline Arousal/Alertness: Awake/alert Orientation Level: Oriented to person Attention: Sustained;Focused;Selective Focused Attention: Impaired Focused Attention Impairment: Functional basic;Verbal basic Sustained Attention: Impaired Sustained Attention Impairment: Functional basic;Verbal basic Selective Attention: Impaired Selective Attention Impairment: Verbal basic;Functional basic Memory: Impaired Memory Impairment: Retrieval deficit;Decreased short term memory;Decreased recall of new information Decreased Short Term Memory: Functional basic;Verbal basic Awareness: Impaired Awareness Impairment: Intellectual impairment Problem Solving: Impaired Problem Solving Impairment: Functional basic;Verbal basic Executive Function: Initiating Reasoning: Impaired Reasoning Impairment: Functional basic;Verbal basic Initiating: Impaired Initiating Impairment: Functional basic;Verbal basic Behaviors: Poor frustration tolerance Safety/Judgment: Impaired Sensation Sensation Light Touch: Impaired by gross assessment Hot/Cold: Not tested Proprioception: Impaired by gross assessment Stereognosis: Not tested Coordination Gross Motor Movements are Fluid and Coordinated: No Coordination and Movement Description: L hemi, global weakness and deconditioning. Limited improvement since date of evaluation. Significantly impacted by cognitive deficits Heel Shin Test: UTA on L due to hip flexor weakness Motor  Motor Motor: Hemiplegia;Motor impersistence;Abnormal postural alignment and control Motor - Skilled Clinical Observations: L hemi, generalized weakness/deconditioning  Mobility Bed Mobility Bed Mobility: Rolling Right;Rolling Left;Supine to Sit;Sit to Supine Rolling Right: Maximal Assistance - Patient 25-49% Rolling Left: Moderate Assistance - Patient 50-74% Supine to Sit: Maximal Assistance - Patient - Patient 25-49% Sit to Supine: Maximal Assistance - Patient  25-49% Transfers Transfers: Sit to Stand;Stand to Sit Sit to Stand: Maximal Assistance - Patient 25-49% Stand to Sit: Maximal Assistance - Patient 25-49% Squat Pivot Transfers: Maximal Assistance - Patient 25-49% Transfer (Assistive device): None Locomotion  Gait Ambulation: No Gait Gait: No Stairs / Additional Locomotion Stairs: No Wheelchair Mobility Wheelchair Mobility: Yes Wheelchair Assistance: Dependent - Patient 0% (in TIS wheelchair) Wheelchair Parts Management: Needs assistance Distance: 0 ft  Trunk/Postural Assessment  Cervical Assessment Cervical Assessment: Exceptions to [facility] (forward head) Thoracic Assessment Thoracic Assessment: Exceptions to [facility] (rounded shoulders) Lumbar Assessment Lumbar Assessment: Exceptions to [facility] (posterior pelvic  tilt) Postural Control Postural Control: Deficits on evaluation Righting Reactions: delayed and insufficient  Balance Balance Balance Assessed: Yes Static Sitting Balance Static Sitting - Balance Support: No upper extremity supported;Feet supported Static Sitting - Level of Assistance: 5: Stand by assistance Dynamic Sitting Balance Dynamic Sitting - Balance Support: Feet supported;During functional activity Dynamic Sitting - Level of Assistance: 4: Min assist;3: Mod assist Static Standing Balance Static Standing - Balance Support: Bilateral upper extremity supported Static Standing - Level of Assistance: 3: Mod assist Dynamic Standing Balance Dynamic Standing - Balance Support: Bilateral upper extremity supported;During functional activity Dynamic Standing - Level of Assistance: 2: Max assist Extremity Assessment      RLE Assessment RLE Assessment: Exceptions to [facility] RLE Strength RLE Overall Strength: Deficits Right Hip Flexion: 3-/5 Right Hip ABduction: 3/5 Right Hip ADduction: 3/5 Right Knee Flexion: 3-/5 Right Knee Extension: 3+/5 Right Ankle Dorsiflexion: 3/5 Right Ankle Plantar Flexion: 3-/5 LLE  Assessment LLE Assessment: Exceptions to WFL LLE Strength LLE Overall Strength: Deficits Left Hip Flexion: 2+/5 Left Hip ABduction: 2+/5 Left Hip ADduction: 2+/5 Left Knee Flexion: 2+/5 Left Knee Extension: 2/5 Left Ankle Dorsiflexion: 2/5 Left Ankle Plantar Flexion: 2/5  Skilled Intervention: Pt sitting in TIS w/c to start - pt agreeable to PT session. She denies pain but reports she's thirst and requests water. Provided her with a total of 8oz of nectar thick water with cues needed for slow and small sips. Focused remainder of session on wheelchair evaluation with [employer] through [employer]. Measurements taken and collaborated to determine best fit and needs. Determined pt will require 20x20 TIS w/c with ROHO cushion, hip abd block on L, firm back rest with head support. Pt c/o buttock pain during evaluation - tilted in w/c to offload buttock which she reported feeling comfort. Returned to room and pt remained seated in w/c with all needs met, reclined for pressure relief.    Michalle Rademaker P Vanassa Penniman PT 11/25/2021, 3:42 PM

## 2021-11-25 NOTE — Plan of Care (Signed)
  Problem: Consults Goal: RH STROKE PATIENT EDUCATION Description: See Patient Education module for education specifics  Outcome: Progressing   Problem: RH BOWEL ELIMINATION Goal: RH STG MANAGE BOWEL WITH ASSISTANCE Description: STG Manage Bowel with mod I Assistance. Outcome: Progressing Goal: RH STG MANAGE BOWEL W/MEDICATION W/ASSISTANCE Description: STG Manage Bowel with Medication with  mod I Assistance. Outcome: Progressing   Problem: RH BLADDER ELIMINATION Goal: RH STG MANAGE BLADDER WITH ASSISTANCE Description: STG Manage Bladder With toileting Assistance Outcome: Progressing   Problem: RH SKIN INTEGRITY Goal: RH STG MAINTAIN SKIN INTEGRITY WITH ASSISTANCE Description: STG Maintain Skin Integrity With min Assistance. Outcome: Progressing   Problem: RH SAFETY Goal: RH STG ADHERE TO SAFETY PRECAUTIONS W/ASSISTANCE/DEVICE Description: STG Adhere to Safety Precautions With cues Assistance/Device. Outcome: Progressing   Problem: RH PAIN MANAGEMENT Goal: RH STG PAIN MANAGED AT OR BELOW PT'S PAIN GOAL Description: At or below level 4 with prns Outcome: Progressing   Problem: RH KNOWLEDGE DEFICIT Goal: RH STG INCREASE KNOWLEDGE OF DIABETES Description: Patient and son will be able to manage DM with medications and dietary modifications using handouts and educational resources independently Outcome: Progressing Goal: RH STG INCREASE KNOWLEDGE OF HYPERTENSION Description: Patient and son will be able to manage HTN with medications and dietary modifications using handouts and educational resources independently Outcome: Progressing Goal: RH STG INCREASE KNOWLEDGE OF DYSPHAGIA/FLUID INTAKE Description: Patient and son will be able to manage Dysphagia,. medications and dietary modifications using handouts and educational resources independently Outcome: Progressing Goal: RH STG INCREASE KNOWLEGDE OF HYPERLIPIDEMIA Description: Patient and son will be able to manage HLD with  medications and dietary modifications using handouts and educational resources independently Outcome: Progressing Goal: RH STG INCREASE KNOWLEDGE OF STROKE PROPHYLAXIS Description: Patient and son will be able to manage secondary risks with medications and dietary modifications using handouts and educational resources independently Outcome: Progressing

## 2021-11-26 DIAGNOSIS — D649 Anemia, unspecified: Secondary | ICD-10-CM

## 2021-11-26 LAB — BASIC METABOLIC PANEL
Anion gap: 8 (ref 5–15)
BUN: 25 mg/dL — ABNORMAL HIGH (ref 8–23)
CO2: 26 mmol/L (ref 22–32)
Calcium: 9.3 mg/dL (ref 8.9–10.3)
Chloride: 104 mmol/L (ref 98–111)
Creatinine, Ser: 1.08 mg/dL — ABNORMAL HIGH (ref 0.44–1.00)
GFR, Estimated: 51 mL/min — ABNORMAL LOW (ref >60)
Glucose, Bld: 100 mg/dL — ABNORMAL HIGH (ref 70–99)
Potassium: 4.2 mmol/L (ref 3.5–5.1)
Sodium: 138 mmol/L (ref 135–145)

## 2021-11-26 LAB — CBC
HCT: 29.8 % — ABNORMAL LOW (ref 36.0–46.0)
Hemoglobin: 9.8 g/dL — ABNORMAL LOW (ref 12.0–15.0)
MCH: 30.5 pg (ref 26.0–34.0)
MCHC: 32.9 g/dL (ref 30.0–36.0)
MCV: 92.8 fL (ref 80.0–100.0)
Platelets: 132 10*3/uL — ABNORMAL LOW (ref 150–400)
RBC: 3.21 MIL/uL — ABNORMAL LOW (ref 3.87–5.11)
RDW: 15.3 % (ref 11.5–15.5)
WBC: 4.1 10*3/uL (ref 4.0–10.5)
nRBC: 0 % (ref 0.0–0.2)

## 2021-11-26 LAB — GLUCOSE, CAPILLARY
Glucose-Capillary: 108 mg/dL — ABNORMAL HIGH (ref 70–99)
Glucose-Capillary: 211 mg/dL — ABNORMAL HIGH (ref 70–99)
Glucose-Capillary: 279 mg/dL — ABNORMAL HIGH (ref 70–99)
Glucose-Capillary: 229 mg/dL — ABNORMAL HIGH (ref 70–99)

## 2021-11-26 MED ORDER — INSULIN GLARGINE-YFGN 100 UNIT/ML ~~LOC~~ SOLN
35.0000 [IU] | Freq: Every day | SUBCUTANEOUS | Status: DC
Start: 2021-11-27 — End: 2021-11-27
  Administered 2021-11-27: 35 [IU] via SUBCUTANEOUS
  Filled 2021-11-26: qty 0.35

## 2021-11-26 NOTE — Progress Notes (Signed)
Patient ID: SHANTAL ROAN, female   DOB: Mar 11, 1941, 81 y.o.   MRN: [id number]  [place] with son via telephone who reports the bed and hoyer were delivered this am to pt's apartment. He has court in the am and will be here after 12:00 to transport pt home. If the wheelchair can be broken down can take in car otherwise will ask [employer] to contact son regarding delivery to home. Aware home health will reach out to him to set up follow up visits. Feels prepared for Dc tomorrow.

## 2021-11-26 NOTE — Progress Notes (Signed)
Inpatient Rehabilitation Care Coordinator Discharge Note   Patient Details  Name: Cassandra Dodson MRN: [id number] Date of Birth: 1940-11-19   Discharge location: HOME WITH SON STAYING WITH HER AWARE OF 24/7 CARE NEEDS  Length of Stay: 22 DAYS  Discharge activity level: MOD ASSIST LEVEL  Home/community participation: ACTIVE  Patient response BP:ZWCHEN Literacy - How often do you need to have someone help you when you read instructions, pamphlets, or other written material from your doctor or pharmacy?: Never  Patient response ID:[id number] Isolation - How often do you feel lonely or isolated from those around you?: Never  Services provided included: MD, RD, PT, OT, SLP, RN, CM, Pharmacy, SW  Financial Services:  Charity fundraiser Utilized: Private Insurance [employer]  Choices offered to/list presented to: PT AND SON  Follow-up services arranged:  Home Health, Patient/Family has no preference for HH/DME agencies, DME Home Health Agency: [place] RN SP    DME : [facility], [place]    Patient response to transportation need: Is the patient able to respond to transportation needs?: Yes In the past 12 months, has lack of transportation kept you from medical appointments or from getting medications?: No In the past 12 months, has lack of transportation kept you from meetings, work, or from getting things needed for daily living?: No    Comments (or additional information):SON WAS HERE FOR HANDS ON EDUCATION AND AWARE OF [place]. FEELS COMFORTABLE WITH PROVIDING 24/7 CARE. AWARE CAN HIRE AND GIVEN PRIVATE DUTY LIST TO PURSUE AT A LATER TIME  Patient/Family verbalized understanding of follow-up arrangements:  Yes  Individual responsible for coordination of the follow-up plan: FITCH-SON [phone]  Confirmed correct DME delivered: Elease Hashimoto 11/26/2021    Elease Hashimoto

## 2021-11-26 NOTE — Progress Notes (Signed)
PROGRESS NOTE   Subjective/Complaints:  Working with therapy this AM. No new concerns today.   ROS:  Limited by cognition  Objective:   No results found. No results for input(s): "WBC", "HGB", "HCT", "PLT" in the last 72 hours.    No results for input(s): "NA", "K", "CL", "CO2", "GLUCOSE", "BUN", "CREATININE", "CALCIUM" in the last 72 hours.     Intake/Output Summary (Last 24 hours) at 11/26/2021 0754 Last data filed at 11/25/2021 1751 Gross per 24 hour  Intake 703.47 ml  Output --  Net 703.47 ml      Pressure Injury 11/20/21 Sacrum Mid Stage 2 -  Partial thickness loss of dermis presenting as a shallow open injury with a red, pink wound bed without slough. (Active)  11/20/21 1217  Location: Sacrum  Location Orientation: Mid  Staging: Stage 2 -  Partial thickness loss of dermis presenting as a shallow open injury with a red, pink wound bed without slough.  Wound Description (Comments):   Present on Admission: No    Physical Exam: Vital Signs Blood pressure (!) 144/73, pulse (!) 109, temperature 98.6 F (37 C), resp. rate 20, height '5\' 5"'$  (1.651 m), weight 78.1 kg, SpO2 98 %.    General: awake, alert, appropriate, sitting up in bed; working with PT;  NAD HENT: conjugate gaze; oropharynx moist CV: regular rate; no JVD Pulmonary: CTA B/L; no W/R/R- good air movement GI: soft, NT, ND, (+)BS Psychiatric: appropriate, pleasant Extremities: No clubbing, cyanosis, or edema  Skin: mild stage 1 on sacrum, otherwise warm and dry Neurological:     Comments: Alert, slow to process-improved.  Mild dysarthria.  Oriented to self and place.  Follows simple commands.   HOH. Cranial nerves II through XII intact, motor strength is 5/5 in RIght and 4- Left deltoid, bicep, tricep, grip,5/5 Right 0/5 left side limited by current lethargy Sensory exam normal sensation to light touch and proprioception in bilateral upper and lower  extremities Self feeds with MinA   Assessment/Plan: 1. Functional deficits which require 3+ hours per day of interdisciplinary therapy in a comprehensive inpatient rehab setting. Physiatrist is providing close team supervision and 24 hour management of active medical problems listed below. Physiatrist and rehab team continue to assess barriers to discharge/monitor patient progress toward functional and medical goals  Care Tool:  Bathing    Body parts bathed by patient: Left arm, Face, Chest   Body parts bathed by helper: Right arm, Abdomen, Front perineal area, Buttocks, Right upper leg, Left upper leg, Right lower leg, Left lower leg     Bathing assist Assist Level: Maximal Assistance - Patient 24 - 49%     Upper Body Dressing/Undressing Upper body dressing   What is the patient wearing?: Pull over shirt    Upper body assist Assist Level: Minimal Assistance - Patient > 75%    Lower Body Dressing/Undressing Lower body dressing      What is the patient wearing?: Pants     Lower body assist Assist for lower body dressing: Maximal Assistance - Patient 25 - 49%     Toileting Toileting    Toileting assist Assist for toileting: Dependent - Patient 0%  Transfers Chair/bed transfer  Transfers assist  Chair/bed transfer activity did not occur: Safety/medical concerns  Chair/bed transfer assist level: Moderate Assistance - Patient 50 - 74%     Locomotion Ambulation   Ambulation assist      Assist level: Maximal Assistance - Patient 25 - 49% Assistive device: Parallel bars Max distance: 34f   Walk 10 feet activity   Assist  Walk 10 feet activity did not occur: Safety/medical concerns        Walk 50 feet activity   Assist Walk 50 feet with 2 turns activity did not occur: Safety/medical concerns         Walk 150 feet activity   Assist Walk 150 feet activity did not occur: Safety/medical concerns         Walk 10 feet on uneven surface   activity   Assist Walk 10 feet on uneven surfaces activity did not occur: Safety/medical concerns         Wheelchair     Assist Is the patient using a wheelchair?: Yes Type of Wheelchair: Manual    Wheelchair assist level: Total Assistance - Patient < 25% Max wheelchair distance: 10'    Wheelchair 50 feet with 2 turns activity    Assist        Assist Level: Dependent - Patient 0%   Wheelchair 150 feet activity     Assist      Assist Level: Dependent - Patient 0%   Blood pressure (!) 144/73, pulse (!) 109, temperature 98.6 F (37 C), resp. rate 20, height '5\' 5"'$  (1.651 m), weight 78.1 kg, SpO2 98 %.    Medical Problem List and Plan: 1. 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  Impaired mobility, working on transfers -Continue Lovenox             -antiplatelet therapy: Aspirin 81 mg daily and Plavix 75 mg day x3 to 6 months 3. Pain Management: Tylenol as needed 4. Anxiety: continue Xanax 0.25-0.5 mg nightly as needed             -antipsychotic agents: N/A 5. Neuropsych/cognition: This patient is capable of making decisions on her own behalf. 6. Skin/Wound Care: Routine skin checks 7. Fluids/Electrolytes/Nutrition: Routine in and outs with follow-up chemistries 8.  Left frontal large meningioma.  Follow-up neurosurgery Dr. EEllene Route  Plan for possible surgical resection 8-12 weeks.   -per NS decadron tapered to '2mg'$  q12 x 3 days, completes 7/23 9.  Hypertension.  improved, continue Norvasc 10 mg daily, maintain avapro,  d/c hydralazine  -improving with steroid taper  -8/7 well controlled 10.  Diabetes mellitus.  Hemoglobin A1c 7.0.   -cbg's improving increase Semglee to 37 units daily  -  d/c ISS Well controlled, decrease CBG checks to AC BID -may need further adjustment after steroids dc  CBG (last 3)  Recent Labs    11/25/21 1650 11/25/21 2110 11/26/21 0632  GLUCAP 196* 195* 108*  8/1 Labile CBG, will consult DM coordinator  8/2 Decrease Semglee to 32u, restart 0-6 unit correctional insulin  8/3 Monitor CBG trend, appears more stable overall  8/4 increase Semglee to 34  8/6- CBG 77-208- somewhat labile- will let primary doctor decide if wants to call DM coordinator..  11.  Hyperlipidemia.  Fenofibrate/Crestor 12.  CKD stage III.  Latest creatinine 1.4 on 11/12/21.  Baseline 1.22-1.69.   -8/7 stable Scr at 1.08 13.  History of endometrial cancer.  Follow-up outpatient 14.  GERD.  continue Protonix 15. Vasovagal episode 7/18: placed teds hose on only with therapy, discussed with therapy 16. Overweight BMI 29.72: provide dietary education 17. 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 This has been discussed with son who is a retired Social research officer, government. Discussed plans for menigioma resection in a few weeks with son. 18. Dysphagia: plan for MBS next week -MBS 8/2 SLP continuing Dysphagia 3 diet with nectar thick liquids, Water protocol started with ice chips and thin water via tsp sips only between meals and following meticulous oral care 19. Neurogenic bowel and bladder: bladder program 20. Hard of hearing: needs hearing aides, will need to ask son to bring from home.  21. Increased lethargy: improving, Stat CH and routine MRI brain without contrast ordered. Shows small increase in prior stroke, discussed with son and neurology. WBC reviewed and within normal limits but UA was positive, treat UTI as below -8/2 Start ritalin  '5mg'$  BID for activation 22. +UA with history of ESBL no dysuria or fever : ceftriaxone started, UC with 80,000 gram negative rods, f/u sensitivities- Ceftriaxone not tested S to Cefepime will switch, UCX with pseudomonas  -Dysuria improved, will plan to continue cefepime for 7 days- last day 8/6 23. Hyponatremia  -Stable at 138 8/7 24. Anemia, HGB 9.8 8/7, may be related to dilution from improved hydration, check hemoccult x3   LOS: 21 days A FACE TO FACE EVALUATION WAS PERFORMED  Jennye Boroughs 11/26/2021, 7:54 AM

## 2021-11-26 NOTE — Plan of Care (Signed)
DC goals. Pt dependant for functional mobility.   Problem: RH Ambulation Goal: LTG Patient will ambulate in controlled environment (PT) Description: LTG: Patient will ambulate in a controlled environment, # of feet with assistance (PT). Outcome: Not Applicable   Problem: RH Wheelchair Mobility Goal: LTG Patient will propel w/c in controlled environment (PT) Description: LTG: Patient will propel wheelchair in controlled environment, # of feet with assist (PT) Outcome: Not Applicable Goal: LTG Patient will propel w/c in home environment (PT) Description: LTG: Patient will propel wheelchair in home environment, # of feet with assistance (PT). Outcome: Not Applicable

## 2021-11-26 NOTE — Progress Notes (Signed)
Speech Language Pathology Discharge Summary  Patient Details  Name: Cassandra Dodson MRN: [id number] Date of Birth: 1941-03-02  Today's Date: 11/26/2021 SLP Individual Time: 1100-1155 SLP Individual Time Calculation (min): 55 min   Skilled Therapeutic Interventions:    S: Pt seen this date for skilled ST intervention targeting deglutition and cognitive-linguistic goals outlined in care plan. Pt received OOB in [place]. Watching TV with nectar-thick liquids present on bedside table. Family not present. Agreeable to ST intervention in hospital room.  O: Pt observed with nectar-thick liquid via cup, which she was observed to take small + single sips with Mod I, which is an improvement from previous sessions. No clinical s/sx concerning for airway invasion/aspiration observed. Took one bite of Dysphagia 3 texture, and declined remainder of snack. Oriented to self, general location, situation, and month with Sup A with question cues; Mod A for date. Recalled 3 deficits s/p CVA with Sup A question prompts. SLP provided skilled education re: aspiration precautions, water protocol, use of nectar-thick liquids upon d/c, use of external aids to facilitate recall, and s/sx concerning for stroke (e.g. BE FAST acronym); pt verbalized understanding of all information with handouts reviewed and provided. Additionally, nectar-thick packets provided, as well as handout of appropriate Dysphagia 3 textures with no thin liquids. Pt recalled at least 4 novel pieces of information from today's session with Min-Mod A. Max A for functional problem-solving and to request pressure relief. SLP provided Total A for pressure relief in TIS for ~8 minutes. Completed portions of the COGNISTAT assessment with the following scores: 9/12 for delayed recall (borderline impairment), 1/4 for mental math calculations (moderate impairment), and 3/8 for verbal abstract reasoning (moderate impairment).   A: Pt demonstrated minimal-moderate  stimulability for skilled ST intervention this date. Continues to benefit from mass practice, spaced retrieval, and errorless learning principles, in addition to external aids to facilitate recall. Recommend continuation of Dysphagia 3 + nectar-thick liquids ; medications whole in puree with full supervision at discharge. May continue water protocol (thin water via tsp only b/w meals and following oral care). Will require 24/7 supervision and assistance at home; son aware and able to provide this care level.  P: At the end of today's session, pt remained OOB in TIS with all safety measures activated. Call bell reviewed and within reach and all immediate needs met. Will plan to d/c from [place] to prepare for upcoming hospital d/c.   Patient has met 8 of 8 long term goals.  Patient to discharge at [facility] Max;Mod level.  Reasons goals not met: N/A   Clinical Impression/Discharge Summary:  At time of discharge, pt has met 8 out of 8 long-term goals set during CIR admission. Pt continues to demonstrate tolerance of Dysphagia 3 diet textures and nectar-thick liquids with strict adherence to aspiration precautions to include small + single bites/sips, alternating bites/sips, slow rate, upright positioning (OOB, if able), and no straws. Initiated water protocol of thin water via tsp sips following meticulous oral care and b/w meals; has been tolerating with no change in vital signs appreciated. Last MBSS completed on 11/21/2021 which revealed consistent shallow and cord-level silent penetration with suspected trace aspiration of thin liquid via cup and straw.   Re: communication, pt's receptive language skills appear grossly intact for concrete concepts and directives when given extra time. Expressive communication is limited by decreased initiation; otherwise, pt can communicate the majority of her basic needs and wants when provided with question prompts. Pt's speech intelligibility has improved since  admission, with pt  subjectively appearing to be 100% intelligible.   Cognitive-linguistic deficits persist; however, I suspect some level of pre-morbid cognitive deficit with presence of left frontal large meningioma prior to admission. Pt and family education complete re: diet recommendations, aspiration risk, water protocol, frequency and importance of oral care, how to thicken to appropriate consistency, need for 24/7 supervision and assistance, and ways to encourage cognitive involvement during ADL tasks at home; pt's son  verbalized understanding. Continue to recommend full supervision with all meals and full assistance with all iADL tasks at home, in addition to [place] to continue addressing cognition and deglutition goals.   Care Partner:  Caregiver Able to Provide Assistance: Yes  Type of Caregiver Assistance: Physical;Cognitive  Recommendation:  24 hour supervision/assistance;Home Health SLP  Rationale for SLP Follow Up: Maximize cognitive function and independence;Maximize swallowing safety;Reduce caregiver burden   Equipment: Suctioning kit   Reasons for discharge: Discharged from hospital   Patient/Family Agrees with Progress Made and Goals Achieved: Yes    Xaden Kaufman A Alaiah Lundy 11/26/2021, 1:40 PM

## 2021-11-26 NOTE — Plan of Care (Signed)
  Problem: RH Swallowing Goal: LTG Patient will consume least restrictive diet using compensatory strategies with assistance (SLP) Description: LTG:  Patient will consume least restrictive diet using compensatory strategies with assistance (SLP) Outcome: Completed/Met Flowsheets (Taken 11/26/2021 1624) LTG: Pt Patient will consume least restrictive diet using compensatory strategies with assistance of (SLP): Minimal Assistance - Patient > 75% Goal: LTG Patient will participate in dysphagia therapy to increase swallow function with assistance (SLP) Description: LTG:  Patient will participate in dysphagia therapy to increase swallow function with assistance (SLP) Outcome: Completed/Met Flowsheets (Taken 11/26/2021 1624) LTG: Pt will participate in dysphagia therapy to increase swallow function with assistance of (SLP): Minimal Assistance - Patient > 75% Goal: LTG Pt will demonstrate functional change in swallow as evidenced by bedside/clinical objective assessment (SLP) Description: LTG: Patient will demonstrate functional change in swallow as evidenced by bedside/clinical objective assessment (SLP) Outcome: Completed/Met Flowsheets (Taken 11/14/2021 1118) LTG: Patient will demonstrate functional change in swallow as evidenced by bedside/clinical objective assessment: Oropharyngeal swallow   Problem: RH Cognition - SLP Goal: RH LTG Patient will demonstrate orientation with cues Description:  LTG:  Patient will demonstrate orientation to person/place/time/situation with cues (SLP)   Outcome: Completed/Met Flowsheets Taken 11/26/2021 1624 by Helaine Chess A, CCC-SLP LTG: Patient will demonstrate orientation using cueing (SLP): Minimal Assistance - Patient > 75% Taken 11/06/2021 1620 by Charna Elizabeth T, CCC-SLP LTG Patient will demonstrate orientation to:  Person  Place  Time  Situation   Problem: RH Expression Communication Goal: LTG Patient will increase speech intelligibility  (SLP) Description: LTG: Patient will increase speech intelligibility at word/phrase/conversation level with cues, % of the time (SLP) Outcome: Completed/Met Flowsheets Taken 11/26/2021 1624 LTG: Patient will increase speech intelligibility (SLP): Modified Independent Taken 11/14/2021 1118 Level: Conversation level   Problem: RH Problem Solving Goal: LTG Patient will demonstrate problem solving for (SLP) Description: LTG:  Patient will demonstrate problem solving for basic/complex daily situations with cues  (SLP) Outcome: Completed/Met Flowsheets Taken 11/14/2021 1118 by Rich Brave, Katerine Morua A, CCC-SLP LTG Patient will demonstrate problem solving for: Maximal Assistance - Patient 25 - 49% Taken 11/06/2021 1620 by Charna Elizabeth T, CCC-SLP LTG: Patient will demonstrate problem solving for (SLP): Basic daily situations   Problem: RH Memory Goal: LTG Patient will use memory compensatory aids to (SLP) Description: LTG:  Patient will use memory compensatory aids to recall biographical/new, daily complex information with cues (SLP) Outcome: Completed/Met Flowsheets (Taken 11/26/2021 1624) LTG: Patient will use memory compensatory aids to (SLP): Minimal Assistance - Patient > 75%   Problem: RH Awareness Goal: LTG: Patient will demonstrate awareness during functional activites type of (SLP) Description: LTG: Patient will demonstrate awareness during functional activites type of (SLP) Outcome: Completed/Met Flowsheets Taken 11/26/2021 1624 LTG: Patient will demonstrate awareness during cognitive/linguistic activities with assistance of (SLP): Minimal Assistance - Patient > 75% Taken 11/14/2021 1118 Patient will demonstrate during cognitive/linguistic activities awareness type of:  Intellectual  Emergent

## 2021-11-26 NOTE — Progress Notes (Signed)
Occupational Therapy Discharge Summary  Patient Details  Name: Cassandra Dodson MRN: [id number] Date of Birth: 08-Aug-1940  Today's Date: 11/26/2021 OT Individual Time: [phone] OT Individual Time Calculation (min): 47 min    Patient has met 3 of 13 long term goals due to improved activity tolerance, improved balance, functional use of  LEFT upper extremity, improved attention, and improved awareness.  Patient to discharge at overall Mod Assist level.  Patient's care partner is independent to provide the necessary physical and cognitive assistance at discharge.    Reasons goals not met: Patient initially not medically stable, and had difficulty maintaining arousal.  Patient with several infections which altered ability to participate.  Patient with inconsistent performace with basic self care skills.  Son aware  Recommendation:  Patient will benefit from ongoing skilled OT services in home health setting to continue to advance functional skills in the area of BADL.  Equipment: Specialized wheelchair - tilt in space. Patient has bathroom equipment from prior surgeries  Reasons for discharge: discharge from hospital  Patient/family agrees with progress made and goals achieved: Yes  OT Discharge Precautions/Restrictions  Precautions Precautions: Fall Precaution Comments: L hemi, incontinent Restrictions Weight Bearing Restrictions: No Pain Pain Assessment Pain Score: 0-No pain ADL ADL Eating: Supervision/safety Where Assessed-Eating: Wheelchair Grooming: Minimal assistance Where Assessed-Grooming: Sitting at sink Upper Body Bathing: Minimal assistance Where Assessed-Upper Body Bathing: Sitting at sink Lower Body Bathing: Moderate assistance Where Assessed-Lower Body Bathing: Edge of bed, Bed level Upper Body Dressing: Minimal assistance Where Assessed-Upper Body Dressing: Sitting at sink Lower Body Dressing: Moderate assistance Where Assessed-Lower Body Dressing: Edge of bed,  Sitting at sink Toileting: Maximal assistance Where Assessed-Toileting: Bed level, Bedside Commode Toilet Transfer: Moderate assistance Toilet Transfer Method: Stand pivot Toilet Transfer Equipment: Drop arm bedside commode Tub/Shower Transfer: Unable to assess Tub/Shower Transfer Method: Unable to assess Social research officer, government: Unable to assess [employer] Method: Unable to assess ADL Comments: Patient has shown significant improvement in ADL in her ability to participate in familair tasks. Vision Baseline Vision/History: 1 Wears glasses Patient Visual Report: No change from baseline Vision Assessment?: Yes Eye Alignment: Within Functional Limits Ocular Range of Motion: Restricted on the left Alignment/Gaze Preference: Within Defined Limits Tracking/Visual Pursuits: Decreased smoothness of horizontal tracking Saccades: Undershoots Convergence: Impaired (comment) Perception  Perception: Impaired Inattention/Neglect: Does not attend to left visual field Spatial Orientation: Decreased midline awareness Comments: posterior bias, inattentive to left body and environment Praxis Praxis: Impaired Praxis Impairment Details: Initiation;Motor planning Cognition Cognition Overall Cognitive Status: Impaired/Different from baseline Arousal/Alertness: Awake/alert Orientation Level: Person;Place;Situation Person: Oriented Place: Oriented Situation: Oriented Memory: Impaired Memory Impairment: Retrieval deficit;Decreased short term memory Decreased Short Term Memory: Functional basic Attention: Selective Focused Attention: Appears intact Focused Attention Impairment: Functional basic Sustained Attention: Impaired Sustained Attention Impairment: Functional basic Selective Attention: Impaired Selective Attention Impairment: Functional basic Awareness: Impaired Awareness Impairment: Intellectual impairment Problem Solving: Impaired Problem Solving Impairment: Functional  basic Executive Function: Organizing;Initiating;Self Correcting;Sequencing;Reasoning;Self Monitoring Reasoning: Impaired Reasoning Impairment: Functional basic Sequencing: Impaired Sequencing Impairment: Functional basic Organizing: Impaired Organizing Impairment: Functional basic Decision Making: Impaired Decision Making Impairment: Functional basic Initiating: Impaired Initiating Impairment: Functional basic Self Monitoring: Impaired Self Monitoring Impairment: Functional basic Self Correcting: Impaired Self Correcting Impairment: Functional basic Safety/Judgment: Impaired Brief Interview for Mental Status (BIMS) Repetition of Three Words (First Attempt): 3 Temporal Orientation: Year: Correct Temporal Orientation: Month: Missed by 6 days to 1 month Temporal Orientation: Day: No answer Recall: "Sock": Yes, no cue required Recall: "Blue": Yes, no  cue required Recall: "Bed": No, could not recall BIMS Summary Score: 11 Sensation Sensation Light Touch: Impaired by gross assessment Hot/Cold: Not tested Proprioception: Impaired by gross assessment Stereognosis: Not tested Coordination Gross Motor Movements are Fluid and Coordinated: No Fine Motor Movements are Fluid and Coordinated: No Coordination and Movement Description: Left hemiplegia, does best in functional task that is familiar to her Finger Nose Finger Test: Limited LUE 9 Hole Peg Test: NT Motor  Motor Motor: Hemiplegia;Motor impersistence;Abnormal postural alignment and control Motor - Discharge Observations: L hemiplegia, motor impersisitence trunk and LE's - limits functional mobility Mobility  Bed Mobility Bed Mobility: Rolling Right;Rolling Left;Supine to Sit;Sit to Supine Rolling Right: Moderate Assistance - Patient 50-74% Rolling Left: Moderate Assistance - Patient 50-74% Supine to Sit: Moderate Assistance - Patient 50-74% Sit to Supine: Maximal Assistance - Patient 25-49% Transfers Sit to Stand: Moderate  Assistance - Patient 50-74% Stand to Sit: Moderate Assistance - Patient 50-74%  Trunk/Postural Assessment  Cervical Assessment Cervical Assessment: Exceptions to [facility] Thoracic Assessment Thoracic Assessment: Exceptions to [facility] Lumbar Assessment Lumbar Assessment: Exceptions to [facility] Postural Control Postural Control: Deficits on evaluation Righting Reactions: no response to left, premature response forward  Balance Balance Balance Assessed: Yes Static Sitting Balance Static Sitting - Balance Support: No upper extremity supported;Feet supported Static Sitting - Level of Assistance: 5: Stand by assistance Dynamic Sitting Balance Dynamic Sitting - Balance Support: Feet supported;During functional activity Dynamic Sitting - Level of Assistance: 4: Min assist Dynamic Sitting - Balance Activities: Reaching for objects;Lateral lean/weight shifting;Forward lean/weight shifting Static Standing Balance Static Standing - Balance Support: Bilateral upper extremity supported Static Standing - Level of Assistance: 3: Mod assist Dynamic Standing Balance Dynamic Standing - Balance Support: Bilateral upper extremity supported;During functional activity Dynamic Standing - Level of Assistance: 2: Max assist Extremity/Trunk Assessment RUE Assessment RUE Assessment: Exceptions to [facility] Active Range of Motion (AROM) Comments: Can raise arm overhead - cannot sustain  - limited trunk extension General Strength Comments: Deconditioned RUE AROM (degrees) Overall AROM Right Upper Extremity: Within functional limits for tasks performed LUE Assessment LUE Assessment: Exceptions to [facility] Active Range of Motion (AROM) Comments: Movement delayed, weaker on left side.  Patient inattentive General Strength Comments: Motor impersistence LUE LUE Body System: Neuro Brunstrum levels for arm and hand: Arm;Hand Brunstrum level for arm: Stage IV Movement is deviating from synergy Brunstrum level for hand: Stage IV  Movements deviating from synergies   Mariah Milling 11/26/2021, 12:59 PM

## 2021-11-27 ENCOUNTER — Other Ambulatory Visit (HOSPITAL_COMMUNITY): Payer: Self-pay

## 2021-11-27 LAB — CBC WITH DIFFERENTIAL/PLATELET
Abs Immature Granulocytes: 0.02 10*3/uL (ref 0.00–0.07)
Basophils Absolute: 0 10*3/uL (ref 0.0–0.1)
Basophils Relative: 1 %
Eosinophils Absolute: 0.2 10*3/uL (ref 0.0–0.5)
Eosinophils Relative: 4 %
HCT: 28.9 % — ABNORMAL LOW (ref 36.0–46.0)
Hemoglobin: 9.8 g/dL — ABNORMAL LOW (ref 12.0–15.0)
Immature Granulocytes: 0 %
Lymphocytes Relative: 25 %
Lymphs Abs: 1.2 10*3/uL (ref 0.7–4.0)
MCH: 31 pg (ref 26.0–34.0)
MCHC: 33.9 g/dL (ref 30.0–36.0)
MCV: 91.5 fL (ref 80.0–100.0)
Monocytes Absolute: 0.6 10*3/uL (ref 0.1–1.0)
Monocytes Relative: 13 %
Neutro Abs: 2.7 10*3/uL (ref 1.7–7.7)
Neutrophils Relative %: 57 %
Platelets: 144 10*3/uL — ABNORMAL LOW (ref 150–400)
RBC: 3.16 MIL/uL — ABNORMAL LOW (ref 3.87–5.11)
RDW: 15.4 % (ref 11.5–15.5)
WBC: 4.6 10*3/uL (ref 4.0–10.5)
nRBC: 0 % (ref 0.0–0.2)

## 2021-11-27 LAB — GLUCOSE, CAPILLARY
Glucose-Capillary: 105 mg/dL — ABNORMAL HIGH (ref 70–99)
Glucose-Capillary: 152 mg/dL — ABNORMAL HIGH (ref 70–99)

## 2021-11-27 MED ORDER — INSULIN GLARGINE 100 UNIT/ML SOLOSTAR PEN
35.0000 [IU] | PEN_INJECTOR | Freq: Every day | SUBCUTANEOUS | 11 refills | Status: DC
  Filled 2021-11-27: qty 15, 42d supply, fill #0

## 2021-11-27 MED ORDER — PEN NEEDLES 32G X 5 MM MISC
1.0000 | Freq: Every day | 1 refills | Status: DC
  Filled 2021-11-27: qty 100, fill #0

## 2021-11-27 MED ORDER — DOCUSATE SODIUM 100 MG PO CAPS
100.0000 mg | ORAL_CAPSULE | Freq: Two times a day (BID) | ORAL | 0 refills | Status: DC
  Filled 2021-11-27: qty 30, 15d supply, fill #0

## 2021-11-27 MED ORDER — INSULIN LISPRO (1 UNIT DIAL) 100 UNIT/ML (KWIKPEN)
1.0000 [IU] | PEN_INJECTOR | Freq: Three times a day (TID) | SUBCUTANEOUS | 11 refills | Status: DC
  Filled 2021-11-27: qty 3, 28d supply, fill #0

## 2021-11-27 MED ORDER — CLOPIDOGREL BISULFATE 75 MG PO TABS
75.0000 mg | ORAL_TABLET | Freq: Every day | ORAL | 0 refills | Status: DC
  Filled 2021-11-27: qty 30, 30d supply, fill #0

## 2021-11-27 MED ORDER — METHYLPHENIDATE HCL 5 MG PO TABS
5.0000 mg | ORAL_TABLET | Freq: Two times a day (BID) | ORAL | 0 refills | Status: DC
  Filled 2021-11-27: qty 60, 30d supply, fill #0

## 2021-11-27 MED ORDER — AMLODIPINE BESYLATE 10 MG PO TABS
10.0000 mg | ORAL_TABLET | Freq: Every day | ORAL | 0 refills | Status: DC
  Filled 2021-11-27: qty 30, 30d supply, fill #0

## 2021-11-27 MED ORDER — BASAGLAR KWIKPEN 100 UNIT/ML ~~LOC~~ SOPN
35.0000 [IU] | PEN_INJECTOR | Freq: Every day | SUBCUTANEOUS | 0 refills | Status: DC
  Filled 2021-11-27: qty 9, 25d supply, fill #0

## 2021-11-27 MED ORDER — ROSUVASTATIN CALCIUM 20 MG PO TABS
20.0000 mg | ORAL_TABLET | Freq: Every day | ORAL | 0 refills | Status: DC
  Filled 2021-11-27: qty 30, 30d supply, fill #0

## 2021-11-27 MED ORDER — PANTOPRAZOLE SODIUM 40 MG PO TBEC
40.0000 mg | DELAYED_RELEASE_TABLET | Freq: Every day | ORAL | 0 refills | Status: DC
  Filled 2021-11-27: qty 30, 30d supply, fill #0

## 2021-11-27 MED ORDER — SENNOSIDES-DOCUSATE SODIUM 8.6-50 MG PO TABS
2.0000 | ORAL_TABLET | Freq: Every day | ORAL | 0 refills | Status: DC
  Filled 2021-11-27: qty 30, 15d supply, fill #0

## 2021-11-27 MED ORDER — FOLIC ACID 1 MG PO TABS
1.0000 mg | ORAL_TABLET | Freq: Every day | ORAL | 0 refills | Status: DC
  Filled 2021-11-27: qty 30, 30d supply, fill #0

## 2021-11-27 MED ORDER — ASPIRIN 81 MG PO TBEC
81.0000 mg | DELAYED_RELEASE_TABLET | Freq: Every day | ORAL | 6 refills | Status: DC
  Filled 2021-11-27: qty 30, 30d supply, fill #0

## 2021-11-27 MED ORDER — CALCIUM CARBONATE 1250 (500 CA) MG PO TABS
1250.0000 mg | ORAL_TABLET | Freq: Every day | ORAL | 0 refills | Status: DC
  Filled 2021-11-27: qty 30, 30d supply, fill #0

## 2021-11-27 MED ORDER — FENOFIBRATE 160 MG PO TABS
160.0000 mg | ORAL_TABLET | Freq: Every day | ORAL | 0 refills | Status: DC
  Filled 2021-11-27: qty 30, 30d supply, fill #0

## 2021-11-27 MED ORDER — INSULIN PEN NEEDLE 32G X 4 MM MISC
1.0000 | Freq: Three times a day (TID) | 1 refills | Status: DC
  Filled 2021-11-27: qty 100, 33d supply, fill #0

## 2021-11-27 MED ORDER — DESITIN 13 % EX CREA
1.0000 "application " | TOPICAL_CREAM | Freq: Two times a day (BID) | CUTANEOUS | 3 refills | Status: DC
  Filled 2021-11-27: qty 113, fill #0

## 2021-11-27 MED ORDER — ALPRAZOLAM 0.5 MG PO TABS
0.2500 mg | ORAL_TABLET | Freq: Every evening | ORAL | 0 refills | Status: DC | PRN
  Filled 2021-11-27: qty 10, 20d supply, fill #0

## 2021-11-27 NOTE — Plan of Care (Signed)
Pt to d/c today

## 2021-11-27 NOTE — Progress Notes (Signed)
PROGRESS NOTE   Subjective/Complaints: Plan for d/c home today Sleepy this morning Patient's chart reviewed- No issues reported overnight Vitals signs stable   ROS:  Limited by cognition  Objective:   No results found. Recent Labs    11/26/21 0758 11/27/21 0648  WBC 4.1 4.6  HGB 9.8* 9.8*  HCT 29.8* 28.9*  PLT 132* 144*      Recent Labs    11/26/21 0758  NA 138  K 4.2  CL 104  CO2 26  GLUCOSE 100*  BUN 25*  CREATININE 1.08*  CALCIUM 9.3      Intake/Output Summary (Last 24 hours) at 11/27/2021 1045 Last data filed at 11/27/2021 3532 Gross per 24 hour  Intake 470 ml  Output --  Net 470 ml     Pressure Injury 11/20/21 Sacrum Mid Stage 2 -  Partial thickness loss of dermis presenting as a shallow open injury with a red, pink wound bed without slough. (Active)  11/20/21 1217  Location: Sacrum  Location Orientation: Mid  Staging: Stage 2 -  Partial thickness loss of dermis presenting as a shallow open injury with a red, pink wound bed without slough.  Wound Description (Comments):   Present on Admission: No    Physical Exam: Vital Signs Blood pressure 129/65, pulse 72, temperature 98.4 F (36.9 C), resp. rate 15, height '5\' 5"'$  (1.651 m), weight 81.6 kg, SpO2 93 %.    Gen: no distress, normal appearing HEENT: oral mucosa pink and moist, NCAT Cardio: Reg rate Chest: normal effort, normal rate of breathing Abd: soft, non-distended Ext: no edema Psych: pleasant, normal affect   Skin: mild stage 1 on sacrum, otherwise warm and dry Neurological:     Comments: Alert, slow to process-improved.  Mild dysarthria.  Oriented to self and place.  Follows simple commands.   HOH. Cranial nerves II through XII intact, motor strength is 5/5 in RIght and 4- Left deltoid, bicep, tricep, grip,5/5 Right 0/5 left side limited by current lethargy Sensory exam normal sensation to light touch and proprioception in  bilateral upper and lower extremities Self feeds with MinA   Assessment/Plan: 1. Functional deficits which require 3+ hours per day of interdisciplinary therapy in a comprehensive inpatient rehab setting. Physiatrist is providing close team supervision and 24 hour management of active medical problems listed below. Physiatrist and rehab team continue to assess barriers to discharge/monitor patient progress toward functional and medical goals  Care Tool:  Bathing    Body parts bathed by patient: Right arm, Left arm, Chest, Abdomen, Right upper leg, Left upper leg, Face   Body parts bathed by helper: Buttocks, Front perineal area, Left lower leg, Right lower leg     Bathing assist Assist Level: Moderate Assistance - Patient 50 - 74%     Upper Body Dressing/Undressing Upper body dressing   What is the patient wearing?: Pull over shirt    Upper body assist Assist Level: Minimal Assistance - Patient > 75%    Lower Body Dressing/Undressing Lower body dressing      What is the patient wearing?: Pants, Incontinence brief     Lower body assist Assist for lower body dressing: Moderate Assistance - Patient  7 - 74%     Toileting Toileting    Toileting assist Assist for toileting: Total Assistance - Patient < 25%     Transfers Chair/bed transfer  Transfers assist  Chair/bed transfer activity did not occur: Safety/medical concerns  Chair/bed transfer assist level: Maximal Assistance - Patient 25 - 49%     Locomotion Ambulation   Ambulation assist   Ambulation activity did not occur: Safety/medical concerns  Assist level: Maximal Assistance - Patient 25 - 49% Assistive device: Parallel bars Max distance: 13f   Walk 10 feet activity   Assist  Walk 10 feet activity did not occur: Safety/medical concerns        Walk 50 feet activity   Assist Walk 50 feet with 2 turns activity did not occur: Safety/medical concerns         Walk 150 feet  activity   Assist Walk 150 feet activity did not occur: Safety/medical concerns         Walk 10 feet on uneven surface  activity   Assist Walk 10 feet on uneven surfaces activity did not occur: Safety/medical concerns         Wheelchair     Assist Is the patient using a wheelchair?: Yes Type of Wheelchair: Manual    Wheelchair assist level: Dependent - Patient 0% Max wheelchair distance: 1569f   Wheelchair 50 feet with 2 turns activity    Assist        Assist Level: Dependent - Patient 0%   Wheelchair 150 feet activity     Assist      Assist Level: Dependent - Patient 0%   Blood pressure 129/65, pulse 72, temperature 98.4 F (36.9 C), resp. rate 15, height '5\' 5"'$  (1.651 m), weight 81.6 kg, SpO2 93 %.    Medical Problem List and Plan: 1. 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  Impaired mobility, working on transfers -Continue Lovenox             -antiplatelet therapy: Aspirin 81 mg daily and Plavix 75 mg day x3 to 6 months 3. Pain Management: Tylenol as needed 4. Anxiety: continue Xanax 0.25-0.5 mg nightly as needed             -antipsychotic agents: N/A 5. Neuropsych/cognition: This patient is capable of making decisions on her own behalf. 6. Skin/Wound Care: Routine skin checks 7. Fluids/Electrolytes/Nutrition: Routine in and outs with follow-up chemistries 8.  Left frontal large meningioma.  Follow-up neurosurgery Dr. ElEllene Route Plan for possible surgical resection 8-12 weeks.   -per NS decadron tapered to '2mg'$  q12 x 3 days, completes 7/23 9.  Hypertension.  improved,  continue Norvasc 10 mg daily, maintain avapro, d/c hydralazine  -improving with steroid taper  -8/7 well controlled 10.  Diabetes mellitus.  Hemoglobin A1c 7.0.   -cbg's improving Increase semglee to 35U -  d/c ISS Well controlled, decrease CBG checks to AC BID -may need further adjustment after steroids dc  CBG (last 3)  Recent Labs    11/26/21 1645 11/26/21 2100 11/27/21 0601  GLUCAP 279* 229* 105*    8/1 Labile CBG, will consult DM coordinator  8/2 Decrease Semglee to 32u, restart 0-6 unit correctional insulin  8/3 Monitor CBG trend, appears more stable overall  8/4 increase Semglee to 34  8/6- CBG 77-208- somewhat labile- will let primary doctor decide if wants to call DM coordinator..  11.  Hyperlipidemia.  Fenofibrate/Crestor 12.  CKD stage III.  Latest creatinine 1.4 on 11/12/21.  Baseline 1.22-1.69.   -8/7 stable Scr at 1.08 13.  History of endometrial cancer.  Follow-up outpatient 14.  GERD.  continue Protonix 15. Vasovagal episode 7/18: placed teds hose on only with therapy, discussed with therapy 16. Overweight BMI 29.72: provide dietary education 17. 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 This has been discussed with son who is a retired Social research officer, government. Discussed plans for menigioma resection in a few weeks with son. 18. Dysphagia: plan for MBS next week -MBS 8/2 SLP continuing Dysphagia 3 diet with nectar thick liquids, Water protocol started with ice chips and thin water via tsp sips only between meals and following meticulous oral care 19. Neurogenic bowel and bladder: bladder program 20. Hard of hearing: needs hearing aides, will need to ask son to bring from home.  21. Increased lethargy: improving, Stat CH and routine MRI brain without contrast ordered. Shows small increase in prior stroke, discussed with son and neurology. WBC reviewed and within normal limits but UA was positive, treat  UTI as below Continue ritalin '5mg'$  BID for activation 22. +UA with history of ESBL no dysuria or fever : ceftriaxone started, UC with 80,000 gram negative rods, f/u sensitivities- Ceftriaxone not tested S to Cefepime will switch, UCX with pseudomonas  -Dysuria improved, will plan to continue cefepime for 7 days- last day 8/6 23. Hyponatremia  -Stable at 138 8/7 24. Anemia, HGB 9.8 8/7, may be related to dilution from improved hydration, check hemoccult x3, messaged nursing to check heme occult prior to her d/c today, Hgb stable at 9.8 on 8/8. Monitor outpatient with PCP.    LOS: 22 days A FACE TO FACE EVALUATION WAS PERFORMED  Ibeth Fahmy P Deoni Cosey 11/27/2021, 10:45 AM

## 2021-11-27 NOTE — Progress Notes (Signed)
PA Olin Hauser gave discharge instructions with family. Family in agreement. Pt left per wheelchair with meds to private vehicle. No complications noted. Sheela Stack, LPN

## 2021-11-27 NOTE — Progress Notes (Addendum)
Inpatient Rehabilitation Discharge Medication Review by a Pharmacist  A complete drug regimen review was completed for this patient to identify any potential clinically significant medication issues.  High Risk Drug Classes Is patient taking? Indication by Medication  Antipsychotic No   Anticoagulant No   Antibiotic No   Opioid No   Antiplatelet Yes Aspirin, Plavix - CVA prophylaxis  Hypoglycemics/insulin Yes Novolog, Basaglar - T2DM  Vasoactive Medication Yes Norvasc - HTN  Chemotherapy No   Other Yes Fenofibrate - hypertriglyceridemia Crestor - HLD Protonix - GERD Folic acid - supplement Senokot-S, Colace - constipation Xanax prn - sleep Ritalin - psychostimulation s/p stroke Oscal - supplement     Type of Medication Issue Identified Description of Issue Recommendation(s)  Drug Interaction(s) (clinically significant)     Duplicate Therapy     Allergy     No Medication Administration End Date  Aspirin Neuro wants DAPT for 3-6 months. Follow-up in the OP arena  Incorrect Dose     Additional Drug Therapy Needed     Significant med changes from prior encounter (inform family/care partners about these prior to discharge).    Other  PTA meds: Humalog mix - stopped Glucophage - stopped Micardis - stopped Stopped on discharge from CIR    Clinically significant medication issues were identified that warrant physician communication and completion of prescribed/recommended actions by midnight of the next day:  No   Time spent performing this drug regimen review (minutes):  [place], PharmD, BCPS Clinical Pharmacist Clinical phone for 11/27/2021 until 3p is [phone] 11/27/2021 11:58 AM

## 2021-11-27 NOTE — Discharge Summary (Signed)
Physician Discharge Summary  Patient ID: Cassandra Dodson MRN: [id number] DOB/AGE: 82-Jul-1942 47 y.o.  Admit date: 11/05/2021 Discharge date: 11/27/2021  Discharge Diagnoses:  Principal Problem:   Right pontine cerebrovascular accident [facility]) Active Problems:   Primary osteoarthritis of left knee   Hypertension   Stage 3b chronic kidney disease ([place])   UTI (urinary tract infection)   Type 2 diabetes mellitus with stage 3b chronic kidney disease, without long-term current use of insulin (HCC)   Pressure injury of skin   Dysphagia   Anemia   Fluctuating mental status   Discharged Condition: stable  Significant Diagnostic Studies: DG Swallowing Func-Speech Pathology  Result Date: 11/21/2021 Table formatting from the original result was not included. Objective Swallowing Evaluation: Type of Study: MBS-Modified Barium Swallow Study  Patient Details Name: Cassandra Dodson MRN: [id number] Date of Birth: [birth year] Today's Date: 11/21/2021 Time: SLP Start Time (ACUTE ONLY): 53 -SLP Stop Time (ACUTE ONLY): 5427 SLP Time Calculation (min) (ACUTE ONLY): 20 min Past Medical History: Past Medical History: Diagnosis Date  Arthritis   CKD (chronic kidney disease), stage III ([place])   patient denies  Depression   Diabetes mellitus without complication ([place])   Difficult intravenous access   Endometrial cancer ([place])   GERD (gastroesophageal reflux disease)   Headache   History of radiation therapy 11/04/2019-12/01/2019  Endometrial HDR; Dr. Gery Pray  Hypertension   Hypothyroidism   Neuromuscular disorder ([place])   neuropathy in feet  Osteoarthritis   PMB (postmenopausal bleeding)   PONV (postoperative nausea and vomiting)   severe nausea and vomiting after knee replacement 06-2014, did ok with 2018 knee replacement  Urinary frequency   Wears glasses  Past Surgical History: Past Surgical History: Procedure Laterality Date  BREAST SURGERY    cyst removed  CESAREAN SECTION    DILATION AND CURETTAGE OF UTERUS N/A 06/24/2019   Procedure: DILATATION AND CURETTAGE;  Surgeon: Lafonda Mosses, MD;  Location: [place];  Service: Gynecology;  Laterality: N/A;  FRACTURE SURGERY    right knee  INTRAUTERINE DEVICE (IUD) INSERTION N/A 06/24/2019  Procedure: INTRAUTERINE DEVICE (IUD) INSERTION MIRENA;  Surgeon: Lafonda Mosses, MD;  Location: [facility];  Service: Gynecology;  Laterality: N/A;  IR ANGIO INTRA EXTRACRAN SEL COM CAROTID INNOMINATE UNI R MOD SED  11/05/2021  IR ANGIO VERTEBRAL SEL VERTEBRAL UNI R MOD SED  11/05/2021  IR CT HEAD LTD  11/01/2021  IR INTRA CRAN STENT  11/01/2021  IR US GUIDE VASC ACCESS RIGHT  11/01/2021  IRRIGATION AND DEBRIDEMENT SEBACEOUS CYST    JOINT REPLACEMENT Right   right  LYMPH NODE DISSECTION N/A 09/14/2019  Procedure: LYMPH NODE DISSECTION;  Surgeon: Lafonda Mosses, MD;  Location: [facility];  Service: Gynecology;  Laterality: N/A;  RADIOLOGY WITH ANESTHESIA N/A 11/01/2021  Procedure: ANGIOPLASTY/STENT;  Surgeon: Luanne Bras, MD;  Location: [place];  Service: Radiology;  Laterality: N/A;  ROBOTIC ASSISTED TOTAL HYSTERECTOMY WITH BILATERAL SALPINGO OOPHERECTOMY Bilateral 09/14/2019  Procedure: XI ROBOTIC ASSISTED TOTAL HYSTERECTOMY WITH BILATERAL SALPINGO OOPHORECTOMY;  Surgeon: Lafonda Mosses, MD;  Location: [facility];  Service: Gynecology;  Laterality: Bilateral;  SENTINEL NODE BIOPSY N/A 09/14/2019  Procedure: SENTINEL NODE BIOPSY;  Surgeon: Lafonda Mosses, MD;  Location: [facility];  Service: Gynecology;  Laterality: N/A;  teeth extration    TONSILLECTOMY    TOTAL HIP ARTHROPLASTY Right 02/23/2015  Procedure: RIGHT TOTAL HIP ARTHROPLASTY ANTERIOR APPROACH;  Surgeon: Leandrew Koyanagi, MD;  Location: [place];  Service: Orthopedics;  Laterality:  Right;  TOTAL KNEE ARTHROPLASTY Left 06/27/2016  Procedure: LEFT TOTAL KNEE ARTHROPLASTY;  Surgeon: Leandrew Koyanagi, MD;  Location: [place];  Service: Orthopedics;  Laterality: Left; HPI: Pt is an 81 y/o female admitted secondary to slurred  speech and L sided weakness. 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 FEES 7/8 revealed a mild-moderate oropharyngeal dysphagia. Oral phase appeared to be mostly impacted by cognition. pharyngeally she had delayed initiation that resulted in penetration of thin (very close to the vocal folds) and nectar thick liquids (which cleared spontaneously). There was some concern for aspiration of thin liquids on delayed coughing as well. Dys 3 diet and nectar thick liquids recommended. Pt went to IR for stent 7/13 and remained intubated post-op; extubated 7/14, after which she passed a Yale swallow screen. SLP re-ordered for swallowing 7/15 when observed to be coughing with thin liquids.  PMH including but not limited to HTN, DM, CKD3, GERD and endometrial cancer.  Subjective: cooperative but confused  Recommendations for follow up therapy are one component of a multi-disciplinary discharge planning process, led by the attending physician.  Recommendations may be updated based on patient status, additional functional criteria and insurance authorization. Assessment / Plan / Recommendation   11/21/2021   1:38 PM Clinical Impressions Clinical Impression MBSS completed. Pt presents with mild oral and mild-moderate pharyngeal dysphagia in the setting of subacute R pontine CVA. Oral phase c/b generalized lingual weakness with resultant lingual pumping, decreased posterior propulsion + containment, and premature spillage. Pharyngeal phase c/b mildly decreased BOT approximation to PPW, diminished laryngeal closure, and incoordination of apenic period and swallow initiation which occurred consistent at the level of the pyriform sinuses. UES appeared [facility].  Aforementioned deficits resulted in  consistent shallow (trace-transient) and silent cord-level penetration with all thin liquid trials with no attempt to clear penetrated material. Unable to fully r/o trace aspiration of thin liquid; however, suspect trace aspiration occurred due to wet vocal quality appreciated and pt's inability to consistently follow commands to clear penetrated material, and when she did attempt, weak, unproductive throat clear was produced. Due to pt's body habitus, it was difficulty to fully visualize the trachea. No penetration nor aspiration appreciated with Dysphagia 1 or Dysphagia 3 textures. Nectar-thick liquid via cup was administered with only shallow trace-transit penetration. Trialed use of chin tuck with thin liquid consumption with no functional change in swallow safety and increased need for cueing to initiate swallow response. It should be noted that pt's overall decreased endurance/weakness appeared to negatively impact pt's performance.  Given instrumental findings, recommend continuation of current diet textures (Dysphagia 3 - mechanical soft with nectar-thick liquids) with full supervision for adherence to aspiration precautions outlined below. Given risk for dehydration with long-term use of thickened liquids and decreased quality of life, may initiate water protocol of ice chips (crushed ice) and thin water via tsp sips only between meals and following meticulous oral care. If water is provided via tsp, please allow extra time between sips, insure pt is fully awake/accepting, and positioned upright (preferably OOB) to mitigate aspiration risk.  SLP Visit Diagnosis Dysphagia, oropharyngeal phase (R13.12);Cognitive communication deficit (R41.841) Frontal lobe and executive function deficit following Cerebral infarction Impact on safety and function Mild aspiration risk     11/03/2021  12:00 PM Treatment Recommendations Treatment  Recommendations Therapy as outlined in treatment plan below     11/21/2021   1:39 PM  Prognosis Prognosis for Safe Diet Advancement Fair Barriers to Reach Goals Cognitive deficits;Time post onset;Severity of deficits   11/21/2021   1:38 PM Diet Recommendations SLP Diet Recommendations Dysphagia 3 (Mech soft) solids;Nectar thick liquid Liquid Administration via Cup;No straw Medication Administration Whole meds with puree Compensations Slow rate;Small sips/bites;Lingual sweep for clearance of pocketing;Follow solids with liquid Postural Changes Seated upright at 90 degrees;Remain semi-upright after after feeds/meals (Comment)     11/21/2021   1:38 PM Other Recommendations Oral Care Recommendations Oral care BID   11/03/2021  12:00 PM Frequency and Duration  Speech Therapy Frequency (ACUTE ONLY) min 2x/week Treatment Duration 2 weeks     11/21/2021   1:33 PM Oral Phase Oral Phase Impaired Oral - Nectar Teaspoon NT Oral - Nectar Cup Lingual pumping;Weak lingual manipulation;Delayed oral transit;Decreased bolus cohesion;Premature spillage Oral - Nectar Straw NT Oral - Thin Teaspoon Weak lingual manipulation;Lingual pumping;Delayed oral transit;Decreased bolus cohesion;Premature spillage Oral - Thin Cup Weak lingual manipulation;Lingual pumping;Premature spillage;Decreased bolus cohesion;Delayed oral transit Oral - Thin Straw Delayed oral transit;Decreased bolus cohesion;Premature spillage;Weak lingual manipulation;Lingual pumping Oral - Puree Lingual pumping;Weak lingual manipulation;Delayed oral transit Oral - Mech Soft Lingual pumping;Weak lingual manipulation;Delayed oral transit Oral - Regular NT Oral - Multi-Consistency NT Oral - Pill NT    11/21/2021   1:34 PM Pharyngeal Phase Pharyngeal Phase Impaired Pharyngeal- Nectar Teaspoon NT Pharyngeal- Nectar Cup Delayed swallow initiation-pyriform sinuses;Reduced laryngeal elevation;Reduced airway/laryngeal closure;Reduced tongue base retraction;Penetration/Aspiration during swallow;Pharyngeal residue - valleculae Pharyngeal Material enters airway, remains ABOVE  vocal cords then ejected out Pharyngeal- Nectar Straw NT Pharyngeal- Thin Teaspoon Delayed swallow initiation-pyriform sinuses;Reduced laryngeal elevation;Reduced airway/laryngeal closure;Reduced tongue base retraction;Pharyngeal residue - valleculae;Pharyngeal residue - pyriform Pharyngeal Material does not enter airway Pharyngeal- Thin Cup Delayed swallow initiation-pyriform sinuses;Reduced laryngeal elevation;Reduced airway/laryngeal closure;Penetration/Aspiration during swallow;Trace aspiration;Pharyngeal residue - valleculae;Pharyngeal residue - pyriform;Compensatory strategies attempted (with notebox) Pharyngeal Material enters airway, CONTACTS cords and not ejected out Pharyngeal- Thin Straw Delayed swallow initiation-pyriform sinuses;Reduced laryngeal elevation;Reduced airway/laryngeal closure;Reduced tongue base retraction;Penetration/Aspiration during swallow;Trace aspiration;Pharyngeal residue - valleculae;Pharyngeal residue - pyriform Pharyngeal Material enters airway, CONTACTS cords and not ejected out Pharyngeal- Puree Delayed swallow initiation-vallecula;Pharyngeal residue - valleculae Pharyngeal Material does not enter airway Pharyngeal- Mechanical Soft Delayed swallow initiation-vallecula;Pharyngeal residue - valleculae Pharyngeal Material does not enter airway Pharyngeal- Regular NT Pharyngeal- Multi-consistency NT Pharyngeal- Pill NT    11/21/2021   1:38 PM Cervical Esophageal Phase  Cervical Esophageal Phase WFL Bethany A Lutes 11/21/2021, 1:42 PM                     DG CHEST PORT 1 VIEW  Result Date: 11/16/2021 CLINICAL DATA:  Cough and recent stroke EXAM: PORTABLE CHEST 1 VIEW COMPARISON:  11/01/2021 FINDINGS: Removal of nasogastric and endotracheal tubes. Internal jugular line has also been removed. Midline trachea. Normal heart size. Atherosclerosis in the transverse aorta. No pleural fluid. Density difference over the upper right chest is favored to represent a skin fold. Minimal scarring at  the lung bases. No lobar consolidation. IMPRESSION: No acute cardiopulmonary disease. Aortic Atherosclerosis (ICD10-I70.0). Electronically Signed   By: Abigail Miyamoto M.D.   On: 11/16/2021 11:11   MR BRAIN WO CONTRAST  Result Date: 11/15/2021 CLINICAL DATA:  Mental status change, unknown cause. EXAM: MRI HEAD WITHOUT CONTRAST TECHNIQUE: Multiplanar, multiecho pulse sequences of the brain and surrounding structures were obtained without intravenous contrast. COMPARISON:  MRI of the  brain October 25, 2021. FINDINGS: Brain: Again seen is area of restricted diffusion in the right paramedian pons which appear mildly increased in volume when compared to prior MRI. There is a new punctate focus of restricted diffusion within the left side of the pons. Punctate focus of increased signal on DWI without correlating low signal on ADC in the left parietal lobe remains unchanged and likely represent a subacute infarct. No acute hemorrhage or hydrocephalus. 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 There is surrounding vasogenic edema, mildly improved when compared to prior MRI. Remote infarcts in the right cerebellar hemisphere, left thalamus, bilateral corona radiata and right centrum semiovale. Scattered confluent foci of T2 hyperintensity within the white matter of the cerebral hemispheres, most likely related to chronic microangiopathy. Vascular: Major flow voids are preserved. Rightward deviation of the bilateral A2/ACA segment by the above described meningioma. Skull and upper cervical spine: Normal marrow signal. Sinuses/Orbits: Negative. Other: None. IMPRESSION: 1. Right paramedian pontine subacute infarct appears to have mildly expanded when compared to prior MRI. 2. New punctate focus  of restricted diffusion on the left side of the pons, consistent with acute/subacute. 3. Stable appearance of a 5 cm plan sphenoidale meningioma with mild interval improvement of the surrounding vasogenic edema. Electronically Signed   By: Pedro Earls M.D.   On: 11/15/2021 11:23   CT HEAD WO CONTRAST (5MM)  Result Date: 11/15/2021 CLINICAL DATA:  81 year old female with altered mental status. Endovascular treatment of high-grade distal basilar artery stenosis earlier this month. Right paracentral brainstem infarct on 10/25/2021. Relatively large anterior cranial fossa meningioma. EXAM: CT HEAD WITHOUT CONTRAST TECHNIQUE: Contiguous axial images were obtained from the base of the skull through the vertex without intravenous contrast. RADIATION DOSE REDUCTION: This exam was performed according to the departmental dose-optimization program which includes automated exposure control, adjustment of the mA and/or kV according to patient size and/or use of iterative reconstruction technique. COMPARISON:  Head CT and brain MRI 10/25/2021. FINDINGS: Brain: Lobulated and hyperdense left paracentral planum sphenoidal meningioma is 49 x 41 x 36 mm (AP by transverse by CC) and stable from earlier this month with regional mass effect and edema. No ventriculomegaly. No acute intracranial hemorrhage identified. No midline shift or acute intracranial mass effect. Basilar cisterns remain patent. Chronic small cerebellar infarcts. Right pontine infarct better demonstrated by MRI. Patchy and confluent cerebral white matter hypodensity is stable. No cortically based acute infarct identified. Vascular: Calcified atherosclerosis at the skull base. New distal basilar artery stent. No suspicious intracranial vascular hyperdensity. Skull: No acute osseous abnormality identified. Sinuses/Orbits: Mild mucosal thickening in addition to bubbly opacity in the posterior right ethmoid air cell now. Other Visualized paranasal  sinuses and mastoids are stable and well aerated. Other: Visualized orbits and scalp soft tissues are within normal limits. IMPRESSION: 1. No new intracranial abnormality. 2. New distal basilar artery stent placed earlier this month. Prior posterior circulation ischemia. 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 Electronically Signed   By: Genevie Ann M.D.   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 Diffuse bilateral vertebrobasilar and right posterior cerebral artery atherosclerotic  narrowing. EXAM: PTA INTRACRANIAL COMPARISON:  Recent CT angiogram of the head and neck. MEDICATIONS: Heparin 3,000 units IV. Ancef 2 g IV antibiotic was administered within 1 hour of the procedure. ANESTHESIA/SEDATION: General anesthesia. CONTRAST:  Omnipaque 300 approximately 80 mL. FLUOROSCOPY TIME:  Fluoroscopy Time: 42 minutes 24 seconds (1765 mGy). COMPLICATIONS: None immediate. TECHNIQUE: Informed written consent was obtained from the son after a thorough discussion of the procedural risks, benefits and alternatives. All questions were addressed. Maximal Sterile Barrier Technique was utilized including caps, mask, sterile gowns, sterile gloves, sterile drape, hand hygiene and skin antiseptic. A timeout was performed prior to the initiation of the procedure. The right forearm to the wrist was prepped and draped in the usual sterile manner. The right radial artery was identified with ultrasound, and its morphology documented permanently in the radiology PACS system. A dorsal palmar anastomosis was verified to be present. Using ultrasound guidance, access into the right radial artery was obtained using a micropuncture set over a 0.018 inch micro guidewire. A 7 French sheath was then inserted without difficulty. The obturator, and micro guidewire were  removed. Good aspiration obtained from the side port of the radial sheath. A cocktail of 2000 units of heparin, 2.5 mg of verapamil, and 200 mcg of nitroglycerin was then infused in diluted form through the sheath without event. A right radial arteriogram was then performed. Over a 0.035 inch Roadrunner guidewire, a combination of a 7 [place] 95 cm sheath with a 5.5 [place] 25 cm Rist support Simmons's 2 catheter was advanced to the proximal right subclavian artery, and access obtained into the right vertebral artery using biplane roadmap technique and constant fluoroscopic guidance. The combination of the Rist guide catheter with the support Simmons 2 catheter was advanced to the distal right vertebral artery. The support catheter and the 035 inch Roadrunner guidewire was removed. Brisk aspiration obtained from the hub of the wrist guide sheath. A gentle control arteriogram performed through the Rist sheath demonstrated no evidence of spasms, dissections or of intraluminal filling defects. Good angiographic outlet was obtained of the right vertebrobasilar junction and the posterior circulation. FINDINGS: The dominant right vertebral artery origin demonstrates wide patency, with moderate tortuosity proximally. More distally, the right vertebrobasilar junction demonstrates mild atherosclerotic narrowing just proximal to the origin of the right posterior-inferior cerebellar artery. A mild-to-moderate stenosis is also evident of the mid basilar artery just distal to the origin of the anterior-inferior cerebellar arteries. Distal to this there is a severe tapered stenosis of the distal basilar artery proximal to the origins of the superior cerebellar arteries. The right posterior cerebral artery demonstrates approximately 50-60% stenosis in its P1 segment and the P2 segment proximally. There is prompt retrograde opacification into the left vertebrobasilar junction to the left posterior-inferior cerebellar artery. This  also unmasks a severe stenosis of the distal left vertebrobasilar junction. ENDOVASCULAR REVASCULARIZATION OF SYMPTOMATIC HIGH-GRADE STENOSIS OF THE DISTAL BASILAR ARTERY Through the 7 [place] Rist guide catheter in the distal right vertebral artery a 5 French 115 cm Catalyst guide catheter was then advanced over a 0.035 inch Roadrunner guidewire and positioned in the distal right vertebrobasilar junction. The guidewire was removed. Good aspiration obtained from the hub of the Catalyst guide catheter. A gentle control arteriogram performed through this demonstrated no evidence of spasms, dissections or of intraluminal filling defects. The distal posterior circulation remained stable. Over an 014 inch Softip 300 cm exchange BMW micro guidewire, an 027 microcatheter was then advanced to the distal end of the  5 [place] guide catheter in the right vertebrobasilar junction. Using a torque device, the micro guidewire with the microcatheter was gently advanced to the distal basilar artery and advanced through the stenotic distal basilar artery into the right posterior cerebral artery distal P2 P3 junction. At this time, the microcatheter was removed. Gentle control arteriogram performed through the 5 French catheter advanced in the mid basilar artery demonstrated no evidence of dissections or of intraluminal filling defects. Measurements were then performed of the distal basilar artery, and also of the mid basilar artery including the length of the severely stenotic segment. It was decided to use a 2.25 mm x 12 mm balloon mounted drug-eluting stent. This was retrogradely purged with heparinized saline infusion, and antegradely with 50% contrast and 50% heparinized saline infusion. Using the rapid exchange technique, the stent delivery system was then advanced without difficulty and positioned such that the distal marker was just proximal to the origins of the superior cerebellar arteries. The balloon mounted stent was then  deployed in the usual manner using micro inflation syringe device via micro tubing inflating to approximally 2.2 mm in a controlled manner, where it was maintained for approximately 20 seconds. The balloon was deflated and retrieved proximally. A control arteriogram performed through the 5 [place] Catalyst guide catheter in the proximal basilar artery now demonstrated significantly improved caliber and flow and apposition of the stent across the previously noted stenosis. Brisk flow was noted into the right posterior cerebral artery and the superior cerebellar arteries. At this time, the balloon was retrieved and removed. Control arteriograms were then performed at approximally 10 and 20 minutes post deployment of the stent. These continued to demonstrate excellent apposition of the stent with significantly improved caliber and flow through the stented segment. Brisk flow was noted into the posterior circulation distally. A final control arteriogram performed through the Rist guide catheter in the proximal right vertebral artery continued to demonstrate excellent flow through the vertebral artery proximally and distally. The stented segment of the distal basilar artery demonstrated wide patency with a less than a 10% residual stenosis. Flow continued to be maintained in the right posterior cerebral arteries, superior cerebellar arteries and the anterior-inferior cerebellar arteries including the posterior-inferior cerebellar artery bilaterally. No acute hemodynamic changes were seen in the periprocedural and in the immediate postprocedural course. The Rist guide catheter was retrieved and removed. The 7 French radial sheath was removed. Hemostasis at the right radial puncture site was achieved with a wrist band. Distal right radial pulse was verified to be present. An immediate CT of the brain demonstrated no evidence of intracranial hemorrhage. At this time diffuse swelling was noted in the forearms to the wrist  bilaterally including the lower extremities. An ultrasound performed over the right radial puncture site revealed a hematoma which responded to compression. Because of the paucity of venous access in the upper extremities and lower extremities via of the edema, anesthesia service went ahead and obtained a right internal jugular vein access. It was decided to keep the patient intubated to protect the airway. Patient was then transferred to the neuro ICU for post procedural management. Critical care consultation was also requested, ventilator management, and management of generalized extremity edema. 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 PLAN: Follow-up in the clinic 2 weeks post discharge. Electronically Signed   By: Willaim Rayas  Deveshwar M.D.   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 Diffuse bilateral vertebrobasilar and right posterior cerebral artery atherosclerotic narrowing. EXAM: PTA INTRACRANIAL COMPARISON:  Recent CT angiogram of the head and neck. MEDICATIONS: Heparin 3,000 units IV. Ancef 2 g IV antibiotic was administered within 1 hour of the procedure. ANESTHESIA/SEDATION: General anesthesia. CONTRAST:  Omnipaque 300 approximately 80 mL. FLUOROSCOPY TIME:  Fluoroscopy Time: 42 minutes 24 seconds (1765 mGy). COMPLICATIONS: None immediate. TECHNIQUE: Informed written consent was obtained from the son after a thorough discussion of the procedural risks, benefits and alternatives. All questions were addressed. Maximal Sterile Barrier Technique was utilized including caps, mask, sterile gowns, sterile gloves, sterile drape, hand hygiene and skin antiseptic. A timeout was performed prior to  the initiation of the procedure. The right forearm to the wrist was prepped and draped in the usual sterile manner. The right radial artery was identified with ultrasound, and its morphology documented permanently in the radiology PACS system. A dorsal palmar anastomosis was verified to be present. Using ultrasound guidance, access into the right radial artery was obtained using a micropuncture set over a 0.018 inch micro guidewire. A 7 French sheath was then inserted without difficulty. The obturator, and micro guidewire were removed. Good aspiration obtained from the side port of the radial sheath. A cocktail of 2000 units of heparin, 2.5 mg of verapamil, and 200 mcg of nitroglycerin was then infused in diluted form through the sheath without event. A right radial arteriogram was then performed. Over a 0.035 inch Roadrunner guidewire, a combination of a 7 [place] 95 cm sheath with a 5.5 [place] 25 cm Rist support Simmons's 2 catheter was advanced to the proximal right subclavian artery, and access obtained into the right vertebral artery using biplane roadmap technique and constant fluoroscopic guidance. The combination of the Rist guide catheter with the support Simmons 2 catheter was advanced to the distal right vertebral artery. The support catheter and the 035 inch Roadrunner guidewire was removed. Brisk aspiration obtained from the hub of the wrist guide sheath. A gentle control arteriogram performed through the Rist sheath demonstrated no evidence of spasms, dissections or of intraluminal filling defects. Good angiographic outlet was obtained of the right vertebrobasilar junction and the posterior circulation. FINDINGS: The dominant right vertebral artery origin demonstrates wide patency, with moderate tortuosity proximally. More distally, the right vertebrobasilar junction demonstrates mild atherosclerotic narrowing just proximal to the origin of the right posterior-inferior cerebellar artery. A  mild-to-moderate stenosis is also evident of the mid basilar artery just distal to the origin of the anterior-inferior cerebellar arteries. Distal to this there is a severe tapered stenosis of the distal basilar artery proximal to the origins of the superior cerebellar arteries. The right posterior cerebral artery demonstrates approximately 50-60% stenosis in its P1 segment and the P2 segment proximally. There is prompt retrograde opacification into the left vertebrobasilar junction to the left posterior-inferior cerebellar artery. This also unmasks a severe stenosis of the distal left vertebrobasilar junction. ENDOVASCULAR REVASCULARIZATION OF SYMPTOMATIC HIGH-GRADE STENOSIS OF THE DISTAL BASILAR ARTERY Through the 7 [place] Rist guide catheter in the distal right vertebral artery a 5 French 115 cm Catalyst guide catheter was then advanced over a 0.035 inch Roadrunner guidewire and positioned in the distal right vertebrobasilar junction. The guidewire was removed. Good aspiration obtained from the hub of the Catalyst guide catheter. A gentle control arteriogram performed through this demonstrated  no evidence of spasms, dissections or of intraluminal filling defects. The distal posterior circulation remained stable. Over an 014 inch Softip 300 cm exchange BMW micro guidewire, an 027 microcatheter was then advanced to the distal end of the 5 [place] guide catheter in the right vertebrobasilar junction. Using a torque device, the micro guidewire with the microcatheter was gently advanced to the distal basilar artery and advanced through the stenotic distal basilar artery into the right posterior cerebral artery distal P2 P3 junction. At this time, the microcatheter was removed. Gentle control arteriogram performed through the 5 French catheter advanced in the mid basilar artery demonstrated no evidence of dissections or of intraluminal filling defects. Measurements were then performed of the distal basilar artery, and  also of the mid basilar artery including the length of the severely stenotic segment. It was decided to use a 2.25 mm x 12 mm balloon mounted drug-eluting stent. This was retrogradely purged with heparinized saline infusion, and antegradely with 50% contrast and 50% heparinized saline infusion. Using the rapid exchange technique, the stent delivery system was then advanced without difficulty and positioned such that the distal marker was just proximal to the origins of the superior cerebellar arteries. The balloon mounted stent was then deployed in the usual manner using micro inflation syringe device via micro tubing inflating to approximally 2.2 mm in a controlled manner, where it was maintained for approximately 20 seconds. The balloon was deflated and retrieved proximally. A control arteriogram performed through the 5 [place] Catalyst guide catheter in the proximal basilar artery now demonstrated significantly improved caliber and flow and apposition of the stent across the previously noted stenosis. Brisk flow was noted into the right posterior cerebral artery and the superior cerebellar arteries. At this time, the balloon was retrieved and removed. Control arteriograms were then performed at approximally 10 and 20 minutes post deployment of the stent. These continued to demonstrate excellent apposition of the stent with significantly improved caliber and flow through the stented segment. Brisk flow was noted into the posterior circulation distally. A final control arteriogram performed through the Rist guide catheter in the proximal right vertebral artery continued to demonstrate excellent flow through the vertebral artery proximally and distally. The stented segment of the distal basilar artery demonstrated wide patency with a less than a 10% residual stenosis. Flow continued to be maintained in the right posterior cerebral arteries, superior cerebellar arteries and the anterior-inferior cerebellar arteries  including the posterior-inferior cerebellar artery bilaterally. No acute hemodynamic changes were seen in the periprocedural and in the immediate postprocedural course. The Rist guide catheter was retrieved and removed. The 7 French radial sheath was removed. Hemostasis at the right radial puncture site was achieved with a wrist band. Distal right radial pulse was verified to be present. An immediate CT of the brain demonstrated no evidence of intracranial hemorrhage. At this time diffuse swelling was noted in the forearms to the wrist bilaterally including the lower extremities. An ultrasound performed over the right radial puncture site revealed a hematoma which responded to compression. Because of the paucity of venous access in the upper extremities and lower extremities via of the edema, anesthesia service went ahead and obtained a right internal jugular vein access. It was decided to keep the patient intubated to protect the airway. Patient was then transferred to the neuro ICU for post procedural management. Critical care consultation was also requested, ventilator management, and management of generalized extremity edema. IMPRESSION: Status post endovascular revascularization of symptomatic high-grade stenosis of the  distal basilar artery using stent assisted balloon angioplasty with a 2.25 mm x 12 mm Onyx balloon mounted drug eluting stent as described. PLAN: Follow-up in the clinic 2 weeks post discharge. Electronically Signed   By: Luanne Bras M.D.   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 Diffuse bilateral vertebrobasilar and right posterior cerebral artery atherosclerotic narrowing. EXAM: PTA INTRACRANIAL COMPARISON:  Recent CT angiogram of the head and neck. MEDICATIONS: Heparin 3,000 units IV. Ancef 2 g IV antibiotic was administered within 1 hour of the  procedure. ANESTHESIA/SEDATION: General anesthesia. CONTRAST:  Omnipaque 300 approximately 80 mL. FLUOROSCOPY TIME:  Fluoroscopy Time: 42 minutes 24 seconds (1765 mGy). COMPLICATIONS: None immediate. TECHNIQUE: Informed written consent was obtained from the son after a thorough discussion of the procedural risks, benefits and alternatives. All questions were addressed. Maximal Sterile Barrier Technique was utilized including caps, mask, sterile gowns, sterile gloves, sterile drape, hand hygiene and skin antiseptic. A timeout was performed prior to the initiation of the procedure. The right forearm to the wrist was prepped and draped in the usual sterile manner. The right radial artery was identified with ultrasound, and its morphology documented permanently in the radiology PACS system. A dorsal palmar anastomosis was verified to be present. Using ultrasound guidance, access into the right radial artery was obtained using a micropuncture set over a 0.018 inch micro guidewire. A 7 French sheath was then inserted without difficulty. The obturator, and micro guidewire were removed. Good aspiration obtained from the side port of the radial sheath. A cocktail of 2000 units of heparin, 2.5 mg of verapamil, and 200 mcg of nitroglycerin was then infused in diluted form through the sheath without event. A right radial arteriogram was then performed. Over a 0.035 inch Roadrunner guidewire, a combination of a 7 [place] 95 cm sheath with a 5.5 [place] 25 cm Rist support Simmons's 2 catheter was advanced to the proximal right subclavian artery, and access obtained into the right vertebral artery using biplane roadmap technique and constant fluoroscopic guidance. The combination of the Rist guide catheter with the support Simmons 2 catheter was advanced to the distal right vertebral artery. The support catheter and the 035 inch Roadrunner guidewire was removed. Brisk aspiration obtained from the hub of the wrist guide sheath. A  gentle control arteriogram performed through the Rist sheath demonstrated no evidence of spasms, dissections or of intraluminal filling defects. Good angiographic outlet was obtained of the right vertebrobasilar junction and the posterior circulation. FINDINGS: The dominant right vertebral artery origin demonstrates wide patency, with moderate tortuosity proximally. More distally, the right vertebrobasilar junction demonstrates mild atherosclerotic narrowing just proximal to the origin of the right posterior-inferior cerebellar artery. A mild-to-moderate stenosis is also evident of the mid basilar artery just distal to the origin of the anterior-inferior cerebellar arteries. Distal to this there is a severe tapered stenosis of the distal basilar artery proximal to the origins of the superior cerebellar arteries. The right posterior cerebral artery demonstrates approximately 50-60% stenosis in its P1 segment and the P2 segment proximally. There is prompt retrograde opacification into the left vertebrobasilar junction to the left posterior-inferior cerebellar artery. This also unmasks a severe stenosis of the distal left vertebrobasilar junction. ENDOVASCULAR REVASCULARIZATION OF SYMPTOMATIC HIGH-GRADE STENOSIS OF THE DISTAL BASILAR ARTERY Through the 7 [place] Rist guide catheter in the distal right vertebral artery a 5 French 115 cm Catalyst guide catheter was  then advanced over a 0.035 inch Roadrunner guidewire and positioned in the distal right vertebrobasilar junction. The guidewire was removed. Good aspiration obtained from the hub of the Catalyst guide catheter. A gentle control arteriogram performed through this demonstrated no evidence of spasms, dissections or of intraluminal filling defects. The distal posterior circulation remained stable. Over an 014 inch Softip 300 cm exchange BMW micro guidewire, an 027 microcatheter was then advanced to the distal end of the 5 [place] guide catheter in the right  vertebrobasilar junction. Using a torque device, the micro guidewire with the microcatheter was gently advanced to the distal basilar artery and advanced through the stenotic distal basilar artery into the right posterior cerebral artery distal P2 P3 junction. At this time, the microcatheter was removed. Gentle control arteriogram performed through the 5 French catheter advanced in the mid basilar artery demonstrated no evidence of dissections or of intraluminal filling defects. Measurements were then performed of the distal basilar artery, and also of the mid basilar artery including the length of the severely stenotic segment. It was decided to use a 2.25 mm x 12 mm balloon mounted drug-eluting stent. This was retrogradely purged with heparinized saline infusion, and antegradely with 50% contrast and 50% heparinized saline infusion. Using the rapid exchange technique, the stent delivery system was then advanced without difficulty and positioned such that the distal marker was just proximal to the origins of the superior cerebellar arteries. The balloon mounted stent was then deployed in the usual manner using micro inflation syringe device via micro tubing inflating to approximally 2.2 mm in a controlled manner, where it was maintained for approximately 20 seconds. The balloon was deflated and retrieved proximally. A control arteriogram performed through the 5 [place] Catalyst guide catheter in the proximal basilar artery now demonstrated significantly improved caliber and flow and apposition of the stent across the previously noted stenosis. Brisk flow was noted into the right posterior cerebral artery and the superior cerebellar arteries. At this time, the balloon was retrieved and removed. Control arteriograms were then performed at approximally 10 and 20 minutes post deployment of the stent. These continued to demonstrate excellent apposition of the stent with significantly improved caliber and flow through the  stented segment. Brisk flow was noted into the posterior circulation distally. A final control arteriogram performed through the Rist guide catheter in the proximal right vertebral artery continued to demonstrate excellent flow through the vertebral artery proximally and distally. The stented segment of the distal basilar artery demonstrated wide patency with a less than a 10% residual stenosis. Flow continued to be maintained in the right posterior cerebral arteries, superior cerebellar arteries and the anterior-inferior cerebellar arteries including the posterior-inferior cerebellar artery bilaterally. No acute hemodynamic changes were seen in the periprocedural and in the immediate postprocedural course. The Rist guide catheter was retrieved and removed. The 7 French radial sheath was removed. Hemostasis at the right radial puncture site was achieved with a wrist band. Distal right radial pulse was verified to be present. An immediate CT of the brain demonstrated no evidence of intracranial hemorrhage. At this time diffuse swelling was noted in the forearms to the wrist bilaterally including the lower extremities. An ultrasound performed over the right radial puncture site revealed a hematoma which responded to compression. Because of the paucity of venous access in the upper extremities and lower extremities via of the edema, anesthesia service went ahead and obtained a right internal jugular vein access. It was decided to keep the patient intubated to protect  the airway. Patient was then transferred to the neuro ICU for post procedural management. Critical care consultation was also requested, ventilator management, and management of generalized extremity edema. 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 PLAN: Follow-up  in the clinic 2 weeks post discharge. Electronically Signed   By: Luanne Bras M.D.   On: 11/05/2021 08:21   DG CHEST PORT 1 VIEW  Result Date: 11/01/2021 CLINICAL DATA:  Check insertion of support tubes. Ventilator dependent respiratory failure. EXAM: PORTABLE CHEST 1 VIEW COMPARISON:  Portable chest earlier today at 6:35 p.m., and abdomen and pelvis CT no contrast 03/30/2019. FINDINGS: Chest: 8:19 p.m. interval ETT pullback to 3.4 cm from the carina. Interval NGT insertion which is better demonstrated on the abdomen film. Right IJ central line tip again is seen in the SVC at the azygous confluence. There is no pneumothorax. The lungs are generally clear. There is aortic tortuosity and atherosclerosis mild aortic ectasia, small hiatal hernia. Heart size and vascular pattern are normal. No pleural effusion is seen. Osteopenia and thoracic spondylosis, degenerative change both shoulders. Abdomen: NGT is well below the diaphragm. The tip superimposes to the right of L5 and is probably in the distal gastric antrum, although the course of the tube is more vertical than expected based on the positioning of the stomach on the prior CT. There is no supine evidence of free air. The bowel pattern is nonobstructive with moderate fecal stasis. The visceral shadows are stable with no visible pathologic calcification although with most of the pelvic area excluded from the exam. Contrast in the kidneys is noted with excretion into the collecting systems. No hydronephrosis. There is osteopenia, degenerative changes and slight levoscoliosis of the lumbar spine. IMPRESSION: 1. Interval ETT pullback to 3.4 cm from the carina. 2. NGT tip superimposed to the right of L5 and more vertical in orientation than expected based on the position of the stomach on the prior CT, probably in the distal gastric antrum. Did the patient have a gastric bypass since the prior CT? 3. No evidence of acute chest disease. 4. Constipation.  Electronically Signed   By: Telford Nab M.D.   On: 11/01/2021 21:04   DG Abd Portable 1V  Result Date: 11/01/2021 CLINICAL DATA:  Check insertion of support tubes. Ventilator dependent respiratory failure. EXAM: PORTABLE CHEST 1 VIEW COMPARISON:  Portable chest earlier today at 6:35 p.m., and abdomen and pelvis CT no contrast 03/30/2019. FINDINGS: Chest: 8:19 p.m. interval ETT pullback to 3.4 cm from the carina. Interval NGT insertion which is better demonstrated on the abdomen film. Right IJ central line tip again is seen in the SVC at the azygous confluence. There is no pneumothorax. The lungs are generally clear. There is aortic tortuosity and atherosclerosis mild aortic ectasia, small hiatal hernia. Heart size and vascular pattern are normal. No pleural effusion is seen. Osteopenia and thoracic spondylosis, degenerative change both shoulders. Abdomen: NGT is well below the diaphragm. The tip superimposes to the right of L5 and is probably in the distal gastric antrum, although the course of the tube is more vertical than expected based on the positioning of the stomach on the prior CT. There is no supine evidence of free air. The bowel pattern is nonobstructive with moderate fecal stasis. The visceral shadows are stable with no visible pathologic calcification although with most of the  pelvic area excluded from the exam. Contrast in the kidneys is noted with excretion into the collecting systems. No hydronephrosis. There is osteopenia, degenerative changes and slight levoscoliosis of the lumbar spine. IMPRESSION: 1. Interval ETT pullback to 3.4 cm from the carina. 2. NGT tip superimposed to the right of L5 and more vertical in orientation than expected based on the position of the stomach on the prior CT, probably in the distal gastric antrum. Did the patient have a gastric bypass since the prior CT? 3. No evidence of acute chest disease. 4. Constipation. Electronically Signed   By: Telford Nab M.D.    On: 11/01/2021 21:04   Portable Chest x-ray  Result Date: 11/01/2021 CLINICAL DATA:  ET tube EXAM: PORTABLE CHEST 1 VIEW COMPARISON:  Chest x-ray 06/11/2019 FINDINGS: Endotracheal tube tip is 7 mm above the carina. Right-sided central venous catheter tip projects over the SVC. The lungs are clear. There is no pleural effusion or pneumothorax. The cardiomediastinal silhouette is within normal limits. No acute fractures are seen. IMPRESSION: 1. Endotracheal tube tip is 7 mm above the carina. Consider repositioning. 2. Right-sided central venous catheter tip projects over the SVC. 3. The lungs are clear. Electronically Signed   By: Ronney Asters M.D.   On: 11/01/2021 18:46     Labs:  Basic Metabolic Panel:    Latest Ref Rng & Units 11/26/2021    7:58 AM 11/23/2021    7:20 AM 11/21/2021    1:27 PM  BMP  Glucose 70 - 99 mg/dL 100  76  265   BUN 8 - 23 mg/dL 25  31  37   Creatinine 0.44 - 1.00 mg/dL 1.08  1.02  1.34   Sodium 135 - 145 mmol/L 138  134  130   Potassium 3.5 - 5.1 mmol/L 4.2  4.4  4.7   Chloride 98 - 111 mmol/L 104  103  99   CO2 22 - 32 mmol/L '26  26  22   '$ Calcium 8.9 - 10.3 mg/dL 9.3  8.8  9.2      CBC:    Latest Ref Rng & Units 11/27/2021    6:48 AM 11/26/2021    7:58 AM 11/16/2021   11:05 AM  CBC  WBC 4.0 - 10.5 K/uL 4.6  4.1  7.2   Hemoglobin 12.0 - 15.0 g/dL 9.8  9.8  11.2   Hematocrit 36.0 - 46.0 % 28.9  29.8  33.2   Platelets 150 - 400 K/uL 144  132  152      CBG: Recent Labs  Lab 11/26/21 1232 11/26/21 1645 11/26/21 2100 11/27/21 0601 11/27/21 1131  GLUCAP 211* 279* 229* 105* 152*    Brief HPI:   Cassandra Dodson is a 81 y.o. female with history of endometrial cancer, HTN, T2DM, CKD, OSA; who was admitted on 10/25/2021 with acute onset of slurred speech and left-sided weakness.  CT/MRI brain showed large acute right paramedian pontine infarct, anterior cranial fossa meningioma with large amount of vasogenic edema in the inferior left frontal lobe, rightward midline  shift with subfalcine herniation of left cingulate gyrus.  CTA head/neck showed plaque in the ICA origin and marked stenosis of the basilar artery proximal to ACA.  Given high risk of recurrent stroke patient underwent basilar artery stenting with angioplasty in 07/13 by Dr. Estanislado Pandy.    She was started on IV heparin and transition to ASA's and Plavix with recommendation to continue this for 5 to 6 months.  Dr. Ellene Route was  consulted for input on left large frontal meningioma and recommends follow-up in 8 to 12 weeks as well as Decadron therapy for edema management.Cleviprex was added for BP control is improving.  She was noted to have dysphagia and was started on dysphagia 3 diet with nectar thick liquids.  Lovenox added for DVT prophylaxis.  Therapy was working with patient and CIR was recommended due to functional decline   Hospital Course: EMIRETH COCKERHAM was admitted to rehab 11/05/2021 for inpatient therapies to consist of PT, ST and OT at least three hours five days a week. Past admission physiatrist, therapy team and rehab RN have worked together to provide customized collaborative inpatient rehab. Her blood pressures were monitored on TID basis and have stabilized. Orthostatic symptoms have resolved and BP controlled off Avapro. Decadron was weaned off per neuro input.  Her diabetes has been monitored with ac/hs CBG checks and SSI was use prn for tighter BS control.  P.o. intake has gradually improved and Semglee has been titrated for better control.  Sliding scale insulin was initially discontinued due to hypoglycemia but was resumed as blood sugar started trending up.    Renal status has been monitored with routine checks and she was noted to develop AKI.  She was encouraged to increase fluid intake and follow-up labs showed hyponatremia has resolved and renal status to be gradually improving. UA/UCX were ordered on 07/28 due to increasing somnolence as well as urinary incontinence.  She was started on  IV ceftriaxone empirically and as urine culture grew out greater than 80,000 colonies of Klebsiella this was changed to cefepime for 7-day course treatment. Her progress has been affected by inconsistent performance due to several infections affecting her ability to participate in therapy as well as her overall progress.  Palliative care was consulted to discuss goals of care and family elected full scope of care.  MBS was repeated on 08/02 with recommendation to continue current restrictions.  Water protocol was started with ice chips and thin water via teaspoon sips only.  Ritalin was added on 08/02 with improvement in energy level as well as activation with good results.  3 linear areas of breakdown on buttocks clean, dry and intact and EPC cream with foam is use for protection and pressure relief.  She has progressed to min to max assist at wheelchair level.  She will continue to receive follow-up home health PT, OT, ST and RN by [facility] home health after discharge.   Rehab course: During patient's stay in rehab weekly team conferences were held to monitor patient's progress, set goals and discuss barriers to discharge. At admission, patient required total assist with ADL tasks and necessary mobility.  She  exhibited mild dysarthria with imprecise speech, cognitive linguistic deficits as well as mild dysphagia. She has had improvement in activity tolerance, balance, postural control as well as ability to compensate for deficits. She has had improvement in functional use LUE  and LLE as well as improvement in awareness.   She requires mod assist to complete ADL tasks.She requires mod to max assist for bed mobility and transfers. She is tolerating dysphagia 3 with nectar liquids and showing improvement in utilization of swallow strategies.  She requires min to max assist for recall, max assist for functional problem-solving and pressure-relief measures and expressive communication is limited by decreased  initiation.  Speech intelligibility has improved and she is able to express her basic wants and needs with basic problems.  Family education was completed with son.  Discharge disposition: 01-Home or Self Care  Diet: dysphagia 3, nectar liquids. Carb modified.   Special Instructions: Perform pressure relief measures every 20 minutes when out of bed.  2. Recommend repeat BMET in one week to monitor renal status.  3. Follow up with Dr. Estanislado Pandy in 2 weeks or per protocol.     Discharge Instructions     Ambulatory referral to Neurology   Complete by: As directed    An appointment is requested in approximately: 4 weeks right pontine infarction   Ambulatory referral to Physical Medicine Rehab   Complete by: As directed    Hospital follow up      Allergies as of 11/27/2021       Reactions   Anesthetics, Amide Other (See Comments)   Pt is intolerant to general anesthesia. Pt will throw up and has thrown up during procedure.    Ether Nausea And Vomiting        Medication List     STOP taking these medications    acetaminophen 325 MG tablet Commonly known as: TYLENOL   dexamethasone 4 MG/ML injection Commonly known as: DECADRON   HumaLOG Mix 75/25 KwikPen (75-25) 100 UNIT/ML Kwikpen Generic drug: Insulin Lispro Prot & Lispro   irbesartan 75 MG tablet Commonly known as: AVAPRO   metFORMIN 1000 MG tablet Commonly known as: GLUCOPHAGE   polyethylene glycol 17 g packet Commonly known as: MIRALAX / GLYCOLAX   telmisartan 80 MG tablet Commonly known as: MICARDIS       TAKE these medications    ALPRAZolam 0.5 MG tablet--Rx# 10 pills Commonly known as: XANAX Take 0.5 tablets (0.25 mg total) by mouth at bedtime as needed for sleep. What changed: how much to take   amLODipine 10 MG tablet Commonly known as: NORVASC Take 1 tablet (10 mg total) by mouth daily.   Aspirin Low Dose 81 MG tablet Generic drug: aspirin EC Take 1 tablet (81 mg total) by mouth  daily. What changed: additional instructions   calcium carbonate 1250 (500 Ca) MG tablet Commonly known as: OS-CAL - dosed in mg of elemental calcium Take 1 tablet (1,250 mg total) by mouth daily with breakfast.   clopidogrel 75 MG tablet Commonly known as: PLAVIX Take 1 tablet (75 mg total) by mouth daily.   Desitin 13 % Crea Generic drug: Zinc Oxide Apply 1 application  topically 2 (two) times daily. To buttocks for protection.   docusate sodium 100 MG capsule Commonly known as: COLACE Take 1 capsule (100 mg total) by mouth 2 (two) times daily.   fenofibrate 160 MG tablet Take 1 tablet (160 mg total) by mouth daily after breakfast.   folic acid 1 MG tablet Commonly known as: FOLVITE Take 1 tablet (1 mg total) by mouth daily.   HumaLOG KwikPen 100 UNIT/ML KwikPen Generic drug: insulin lispro Inject 1 Units into the skin 3 (three) times daily with meals.   insulin glargine 100 UNIT/ML Solostar Pen Commonly known as: LANTUS Inject 35 Units into the skin daily.   methylphenidate 5 MG tablet--Rx # 60 pills.  Commonly known as: RITALIN Take 1 tablet (5 mg total) by mouth 2 (two) times daily with breakfast and lunch.   mouth rinse Liqd solution 15 mLs by Mouth Rinse route as needed (for oral care).   pantoprazole 40 MG tablet Commonly known as: PROTONIX Take 1 tablet (40 mg total) by mouth daily.   Pentips 32G X 4 MM Misc Generic drug: Insulin Pen Needle Use daily in the morning,  at noon, and at bedtime.   rosuvastatin 20 MG tablet Commonly known as: CRESTOR Take 1 tablet (20 mg total) by mouth daily.   Senexon-S 8.6-50 MG tablet Generic drug: senna-docusate Take 2 tablets by mouth daily after breakfast.        Follow-up Information     Raulkar, Clide Deutscher, MD Follow up.   Specialty: Physical Medicine and Rehabilitation Why: Office to call for appointment Contact information: [address] [place] [address] [phone]          Kristeen Miss, MD Follow up.   Specialty: Neurosurgery Why: Call for appointment to discuss meningioma possible resection Contact information: [address] [place] [address] [phone]         Luanne Bras, MD Follow up.   Specialties: Interventional Radiology, Radiology Why: Call for appointment Contact information: [place] [address] [phone]         Shon Baton, MD Follow up.   Specialty: Internal Medicine Why: Call in 1-2 days for post hospital follow up Contact information: [place] [address] [phone]                 Signed: Bary Leriche 11/28/2021, 6:46 PM

## 2021-11-28 ENCOUNTER — Other Ambulatory Visit (HOSPITAL_COMMUNITY): Payer: Self-pay | Admitting: Interventional Radiology

## 2021-11-28 DIAGNOSIS — I771 Stricture of artery: Secondary | ICD-10-CM

## 2021-11-28 DIAGNOSIS — D649 Anemia, unspecified: Secondary | ICD-10-CM

## 2021-11-28 DIAGNOSIS — R1310 Dysphagia, unspecified: Secondary | ICD-10-CM

## 2021-11-28 DIAGNOSIS — R4182 Altered mental status, unspecified: Secondary | ICD-10-CM

## 2021-12-11 ENCOUNTER — Ambulatory Visit (HOSPITAL_COMMUNITY): Admission: RE | Admit: 2021-12-11 | Payer: Medicare Other | Source: Ambulatory Visit

## 2021-12-18 ENCOUNTER — Ambulatory Visit (HOSPITAL_COMMUNITY)
Admission: RE | Admit: 2021-12-18 | Discharge: 2021-12-18 | Disposition: A | Discharge status: Home / Self Care | Payer: Medicare Other | Source: Ambulatory Visit | Attending: Interventional Radiology | Admitting: Interventional Radiology

## 2021-12-18 DIAGNOSIS — I771 Stricture of artery: Secondary | ICD-10-CM

## 2021-12-19 HISTORY — PX: IR RADIOLOGIST EVAL & MGMT: IMG5224

## 2022-01-02 NOTE — Progress Notes (Deleted)
[facility] [address] [place]. [place] [address] [phone]       HOSPITAL FOLLOW UP NOTE  [name] Date of Birth:  June 12, 1940 Medical Record Number:  [id number]   Reason for Referral:  hospital stroke follow up    SUBJECTIVE:   CHIEF COMPLAINT:  No chief complaint on file.   HPI:   [name] is a 81 y.o. female with history of  HTN, DM, CKD3, GERD and endometrial cancer who presented on 10/25/2021 with slurred speech for two days and left leg weakness for one day.  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  EF 60 to 65%.  LDL 133.  A1c 7.0.  Recommended DAPT for 3 to 6 months post stent and Plavix alone as well as initiated Crestor 20 mg daily.  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  Per therapy recommendations, discharged to CIR for ongoing therapy needs.        PERTINENT IMAGING  CT head 5.0 cm intracranial mass along anterior falx, likely to be a meningioma with vasogenic edema and 2 cm localized midline shift CTA head & neck neck no LVO, multifocal intracranial stenosis with marked stenosis of basilar artery MRI  large, acute right paramedian pontine infarct, left anterior cranial fossa meningioma with vasogenic edema and MLS 2D Echo EF 60-65%, moderate hypertrophy of left basal-septal segment, interatrial septum not well visualized LDL 133 HgbA1c 7.0    ROS:   14 system review of systems performed and negative with exception of ***  PMH:  Past Medical History:  Diagnosis Date   Arthritis    CKD (chronic kidney disease), stage III ([place])    patient denies   Depression    Diabetes mellitus without  complication ([place])    Difficult intravenous access    Endometrial cancer ([place])    GERD (gastroesophageal reflux disease)    Headache    History of radiation therapy 11/04/2019-12/01/2019   Endometrial HDR; Dr. Gery Pray   Hypertension    Hypothyroidism    Neuromuscular disorder ([place])    neuropathy in feet   Osteoarthritis    PMB (postmenopausal bleeding)    PONV (postoperative nausea and vomiting)    severe nausea and vomiting after knee replacement 06-2014, did ok with 2018 knee replacement   Urinary frequency    Wears glasses     PSH:  Past Surgical History:  Procedure Laterality Date   BREAST SURGERY     cyst removed   CESAREAN SECTION     DILATION AND CURETTAGE OF UTERUS N/A 06/24/2019   Procedure: DILATATION AND CURETTAGE;  Surgeon: Lafonda Mosses, MD;  Location: [facility];  Service: Gynecology;  Laterality: N/A;   FRACTURE SURGERY     right knee   INTRAUTERINE DEVICE (IUD) INSERTION N/A 06/24/2019   Procedure: INTRAUTERINE DEVICE (IUD) INSERTION MIRENA;  Surgeon: Lafonda Mosses, MD;  Location: [facility];  Service: Gynecology;  Laterality: N/A;   IR ANGIO INTRA EXTRACRAN SEL COM CAROTID INNOMINATE UNI R MOD SED  11/05/2021   IR ANGIO VERTEBRAL SEL VERTEBRAL UNI R MOD SED  11/05/2021   IR CT HEAD LTD  11/01/2021   IR INTRA CRAN STENT  11/01/2021  IR RADIOLOGIST EVAL & MGMT  12/19/2021   IR US GUIDE VASC ACCESS RIGHT  11/01/2021   IRRIGATION AND DEBRIDEMENT SEBACEOUS CYST     JOINT REPLACEMENT Right    right   LYMPH NODE DISSECTION N/A 09/14/2019   Procedure: LYMPH NODE DISSECTION;  Surgeon: Lafonda Mosses, MD;  Location: [facility];  Service: Gynecology;  Laterality: N/A;   RADIOLOGY WITH ANESTHESIA N/A 11/01/2021   Procedure: ANGIOPLASTY/STENT;  Surgeon: Luanne Bras, MD;  Location: [facility];  Service: Radiology;  Laterality: N/A;   ROBOTIC ASSISTED TOTAL HYSTERECTOMY WITH BILATERAL SALPINGO OOPHERECTOMY Bilateral 09/14/2019    Procedure: XI ROBOTIC ASSISTED TOTAL HYSTERECTOMY WITH BILATERAL SALPINGO OOPHORECTOMY;  Surgeon: Lafonda Mosses, MD;  Location: [facility];  Service: Gynecology;  Laterality: Bilateral;   SENTINEL NODE BIOPSY N/A 09/14/2019   Procedure: SENTINEL NODE BIOPSY;  Surgeon: Lafonda Mosses, MD;  Location: [facility];  Service: Gynecology;  Laterality: N/A;   teeth extration     TONSILLECTOMY     TOTAL HIP ARTHROPLASTY Right 02/23/2015   Procedure: RIGHT TOTAL HIP ARTHROPLASTY ANTERIOR APPROACH;  Surgeon: Leandrew Koyanagi, MD;  Location: [facility];  Service: Orthopedics;  Laterality: Right;   TOTAL KNEE ARTHROPLASTY Left 06/27/2016   Procedure: LEFT TOTAL KNEE ARTHROPLASTY;  Surgeon: Leandrew Koyanagi, MD;  Location: [place];  Service: Orthopedics;  Laterality: Left;    Social History:  Social History   Socioeconomic History   Marital status: Widowed    Spouse name: Not on file   Number of children: Not on file   Years of education: Not on file   Highest education level: Not on file  Occupational History   Not on file  Tobacco Use   Smoking status: Former    Packs/day: 1.50    Years: 45.00    Total pack years: 67.50    Types: Cigarettes    Quit date: 04/22/2002    Years since quitting: 19.7   Smokeless tobacco: Never  Vaping Use   Vaping Use: Never used  Substance and Sexual Activity   Alcohol use: Yes    Comment: rarely   Drug use: No   Sexual activity: Not Currently  Other Topics Concern   Not on file  Social History Narrative   Not on file   Social Determinants of Health   Financial Resource Strain: Not on file  Food Insecurity: Not on file  Transportation Needs: Not on file  Physical Activity: Not on file  Stress: Not on file  Social Connections: Not on file  Intimate Partner Violence: Not on file    Family History:  Family History  Problem Relation Age of Onset   Pancreatic cancer Mother    Stroke Father    Hypertension Father    Colon cancer Neg Hx    Breast cancer Neg Hx     Ovarian cancer Neg Hx    Endometrial cancer Neg Hx    Prostate cancer Neg Hx     Medications:   Current Outpatient Medications on File Prior to Visit  Medication Sig Dispense Refill   ALPRAZolam (XANAX) 0.5 MG tablet Take 0.5 tablets (0.25 mg total) by mouth at bedtime as needed for sleep. 10 tablet 0   amLODipine (NORVASC) 10 MG tablet Take 1 tablet (10 mg total) by mouth daily. 30 tablet 0   aspirin EC 81 MG tablet Take 1 tablet (81 mg total) by mouth daily. 100 tablet 6   calcium carbonate (OS-CAL - DOSED IN MG OF ELEMENTAL CALCIUM)  1250 (500 Ca) MG tablet Take 1 tablet (1,250 mg total) by mouth daily with breakfast. 30 tablet 0   clopidogrel (PLAVIX) 75 MG tablet Take 1 tablet (75 mg total) by mouth daily. 30 tablet 0   docusate sodium (COLACE) 100 MG capsule Take 1 capsule (100 mg total) by mouth 2 (two) times daily. 60 capsule 0   fenofibrate 160 MG tablet Take 1 tablet (160 mg total) by mouth daily after breakfast. 30 tablet 0   folic acid (FOLVITE) 1 MG tablet Take 1 tablet (1 mg total) by mouth daily. 30 tablet 0   insulin glargine (LANTUS) 100 UNIT/ML Solostar Pen Inject 35 Units into the skin daily. 15 mL 11   insulin lispro (HUMALOG) 100 UNIT/ML KwikPen Inject 1 Units into the skin 3 (three) times daily with meals. 15 mL 11   Insulin Pen Needle 32G X 4 MM MISC Use daily in the morning, at noon, and at bedtime. 100 each 1   methylphenidate (RITALIN) 5 MG tablet Take 1 tablet (5 mg total) by mouth 2 (two) times daily with breakfast and lunch. 60 tablet 0   Mouthwashes (MOUTH RINSE) LIQD solution 15 mLs by Mouth Rinse route as needed (for oral care).  0   pantoprazole (PROTONIX) 40 MG tablet Take 1 tablet (40 mg total) by mouth daily. 30 tablet 0   rosuvastatin (CRESTOR) 20 MG tablet Take 1 tablet (20 mg total) by mouth daily. 30 tablet 0   senna-docusate (SENOKOT-S) 8.6-50 MG tablet Take 2 tablets by mouth daily after breakfast. 60 tablet 0   Zinc Oxide (DESITIN) 13 % CREA Apply 1  application  topically 2 (two) times daily. To buttocks for protection. 113 g 3   No current facility-administered medications on file prior to visit.    Allergies:   Allergies  Allergen Reactions   Anesthetics, Amide Other (See Comments)    Pt is intolerant to general anesthesia. Pt will throw up and has thrown up during procedure.    Ether Nausea And Vomiting      OBJECTIVE:  Physical Exam  There were no vitals filed for this visit. There is no height or weight on file to calculate BMI. No results found.      No data to display           General: well developed, well nourished, seated, in no evident distress Head: head normocephalic and atraumatic.   Neck: supple with no carotid or supraclavicular bruits Cardiovascular: regular rate and rhythm, no murmurs Musculoskeletal: no deformity Skin:  no rash/petichiae Vascular:  Normal pulses all extremities   Neurologic Exam Mental Status: Awake and fully alert. Oriented to place and time. Recent and remote memory intact. Attention span, concentration and fund of knowledge appropriate. Mood and affect appropriate.  Cranial Nerves: Fundoscopic exam reveals sharp disc margins. Pupils equal, briskly reactive to light. Extraocular movements full without nystagmus. Visual fields full to confrontation. Hearing intact. Facial sensation intact. Face, tongue, palate moves normally and symmetrically.  Motor: Normal bulk and tone. Normal strength in all tested extremity muscles Sensory.: intact to touch , pinprick , position and vibratory sensation.  Coordination: Rapid alternating movements normal in all extremities. Finger-to-nose and heel-to-shin performed accurately bilaterally. Gait and Station: Arises from chair without difficulty. Stance is normal. Gait demonstrates normal stride length and balance with ***. Tandem walk and heel toe ***.  Reflexes: 1+ and symmetric. Toes downgoing.     NIHSS  *** Modified Rankin   ***  ASSESSMENT: Cassandra Dodson is a 81 y.o. 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 Vascular risk factors include HTN, HLD, DM, advanced age and former tobacco use.  Incidental finding of left frontal large meningioma during recent hospitalization     PLAN:  Right paramedian pontine stroke:  Residual deficit: ***.  Continue aspirin '81mg'$  daily and Plavix and rosuvastatin (Crestor) for secondary stroke prevention.  Continue DAPT for 3 to 6 months post stent then Plavix alone  Discussed secondary stroke prevention measures and importance of close PCP follow up for aggressive stroke risk factor management including BP goal<130/90, HLD with LDL goal<70 and DM with A1c.<7 .  Stroke labs ***: LDL ***, A1c *** I have gone over the pathophysiology of stroke, warning signs and symptoms, risk factors and their management in some detail with instructions to go to the closest emergency room for symptoms of concern. Basilar artery stenosis: s/p stent.  Recommended DAPT for 3 to 6 months then Plavix alone. Left frontal large meningioma: Followed by neurosurgery    Follow up in *** or call earlier if needed   CC:  [place] provider: Dr. Leonie Man PCP: Shon Baton, MD    I spent *** minutes of face-to-face and non-face-to-face time with patient.  This included previsit chart review including review of recent hospitalization, lab review, study review, order entry, electronic health record documentation, patient education regarding recent stroke including etiology, secondary stroke prevention measures and importance of managing stroke risk factors, residual deficits and typical recovery time and answered all other questions to patient satisfaction   Frann Rider, AGNP-BC  [facility] [address] [place], [place] [address]  Phone [phone] Fax [phone] Note:  This document was prepared with digital dictation and possible smart phrase technology. Any transcriptional errors that result from this process are unintentional.

## 2022-01-03 ENCOUNTER — Inpatient Hospital Stay: Payer: Medicare Other | Admitting: Adult Health

## 2022-01-04 ENCOUNTER — Encounter: Payer: Medicare Other | Admitting: Physical Medicine and Rehabilitation

## 2022-01-11 ENCOUNTER — Emergency Department (HOSPITAL_COMMUNITY): Payer: Medicare Other

## 2022-01-11 ENCOUNTER — Inpatient Hospital Stay (HOSPITAL_COMMUNITY)
Admission: EM | Admit: 2022-01-11 | Discharge: 2022-01-17 | DRG: 871 | Disposition: A | Discharge status: Home Health | Payer: Medicare Other | Attending: Family Medicine | Admitting: Family Medicine

## 2022-01-11 ENCOUNTER — Other Ambulatory Visit: Payer: Self-pay

## 2022-01-11 ENCOUNTER — Encounter (HOSPITAL_COMMUNITY): Payer: Self-pay

## 2022-01-11 DIAGNOSIS — N390 Urinary tract infection, site not specified: Secondary | ICD-10-CM | POA: Diagnosis present

## 2022-01-11 DIAGNOSIS — Z794 Long term (current) use of insulin: Secondary | ICD-10-CM | POA: Diagnosis not present

## 2022-01-11 DIAGNOSIS — I129 Hypertensive chronic kidney disease with stage 1 through stage 4 chronic kidney disease, or unspecified chronic kidney disease: Secondary | ICD-10-CM | POA: Diagnosis present

## 2022-01-11 DIAGNOSIS — Z96653 Presence of artificial knee joint, bilateral: Secondary | ICD-10-CM | POA: Diagnosis present

## 2022-01-11 DIAGNOSIS — F32A Depression, unspecified: Secondary | ICD-10-CM | POA: Diagnosis present

## 2022-01-11 DIAGNOSIS — G9389 Other specified disorders of brain: Secondary | ICD-10-CM | POA: Diagnosis not present

## 2022-01-11 DIAGNOSIS — K219 Gastro-esophageal reflux disease without esophagitis: Secondary | ICD-10-CM | POA: Diagnosis present

## 2022-01-11 DIAGNOSIS — C541 Malignant neoplasm of endometrium: Secondary | ICD-10-CM | POA: Diagnosis present

## 2022-01-11 DIAGNOSIS — G9341 Metabolic encephalopathy: Secondary | ICD-10-CM | POA: Diagnosis present

## 2022-01-11 DIAGNOSIS — Z87891 Personal history of nicotine dependence: Secondary | ICD-10-CM | POA: Diagnosis not present

## 2022-01-11 DIAGNOSIS — Z79899 Other long term (current) drug therapy: Secondary | ICD-10-CM

## 2022-01-11 DIAGNOSIS — Z888 Allergy status to other drugs, medicaments and biological substances status: Secondary | ICD-10-CM

## 2022-01-11 DIAGNOSIS — R6520 Severe sepsis without septic shock: Secondary | ICD-10-CM | POA: Diagnosis present

## 2022-01-11 DIAGNOSIS — G936 Cerebral edema: Secondary | ICD-10-CM | POA: Diagnosis present

## 2022-01-11 DIAGNOSIS — A419 Sepsis, unspecified organism: Secondary | ICD-10-CM | POA: Diagnosis present

## 2022-01-11 DIAGNOSIS — R4182 Altered mental status, unspecified: Secondary | ICD-10-CM | POA: Diagnosis present

## 2022-01-11 DIAGNOSIS — B965 Pseudomonas (aeruginosa) (mallei) (pseudomallei) as the cause of diseases classified elsewhere: Secondary | ICD-10-CM | POA: Diagnosis present

## 2022-01-11 DIAGNOSIS — Z800 Family history of malignant neoplasm of digestive organs: Secondary | ICD-10-CM

## 2022-01-11 DIAGNOSIS — E785 Hyperlipidemia, unspecified: Secondary | ICD-10-CM | POA: Diagnosis present

## 2022-01-11 DIAGNOSIS — E669 Obesity, unspecified: Secondary | ICD-10-CM | POA: Diagnosis present

## 2022-01-11 DIAGNOSIS — Z8542 Personal history of malignant neoplasm of other parts of uterus: Secondary | ICD-10-CM

## 2022-01-11 DIAGNOSIS — I69354 Hemiplegia and hemiparesis following cerebral infarction affecting left non-dominant side: Secondary | ICD-10-CM | POA: Diagnosis not present

## 2022-01-11 DIAGNOSIS — Z823 Family history of stroke: Secondary | ICD-10-CM

## 2022-01-11 DIAGNOSIS — E1122 Type 2 diabetes mellitus with diabetic chronic kidney disease: Secondary | ICD-10-CM | POA: Diagnosis present

## 2022-01-11 DIAGNOSIS — R29810 Facial weakness: Secondary | ICD-10-CM | POA: Diagnosis present

## 2022-01-11 DIAGNOSIS — Z86011 Personal history of benign neoplasm of the brain: Secondary | ICD-10-CM

## 2022-01-11 DIAGNOSIS — D320 Benign neoplasm of cerebral meninges: Secondary | ICD-10-CM | POA: Diagnosis present

## 2022-01-11 DIAGNOSIS — Z923 Personal history of irradiation: Secondary | ICD-10-CM

## 2022-01-11 DIAGNOSIS — I10 Essential (primary) hypertension: Secondary | ICD-10-CM | POA: Diagnosis present

## 2022-01-11 DIAGNOSIS — Z90710 Acquired absence of both cervix and uterus: Secondary | ICD-10-CM

## 2022-01-11 DIAGNOSIS — E039 Hypothyroidism, unspecified: Secondary | ICD-10-CM | POA: Diagnosis present

## 2022-01-11 DIAGNOSIS — Z96641 Presence of right artificial hip joint: Secondary | ICD-10-CM | POA: Diagnosis present

## 2022-01-11 DIAGNOSIS — N1832 Chronic kidney disease, stage 3b: Secondary | ICD-10-CM | POA: Diagnosis present

## 2022-01-11 DIAGNOSIS — G9340 Encephalopathy, unspecified: Secondary | ICD-10-CM

## 2022-01-11 DIAGNOSIS — E876 Hypokalemia: Secondary | ICD-10-CM | POA: Diagnosis present

## 2022-01-11 DIAGNOSIS — E1165 Type 2 diabetes mellitus with hyperglycemia: Secondary | ICD-10-CM | POA: Diagnosis present

## 2022-01-11 DIAGNOSIS — I639 Cerebral infarction, unspecified: Secondary | ICD-10-CM | POA: Diagnosis present

## 2022-01-11 DIAGNOSIS — Z6838 Body mass index (BMI) 38.0-38.9, adult: Secondary | ICD-10-CM

## 2022-01-11 DIAGNOSIS — Z7982 Long term (current) use of aspirin: Secondary | ICD-10-CM

## 2022-01-11 DIAGNOSIS — Z8249 Family history of ischemic heart disease and other diseases of the circulatory system: Secondary | ICD-10-CM

## 2022-01-11 LAB — COMPREHENSIVE METABOLIC PANEL
ALT: 8 U/L (ref 0–44)
AST: 21 U/L (ref 15–41)
Albumin: 3.3 g/dL — ABNORMAL LOW (ref 3.5–5.0)
Alkaline Phosphatase: 62 U/L (ref 38–126)
Anion gap: 16 — ABNORMAL HIGH (ref 5–15)
BUN: 19 mg/dL (ref 8–23)
CO2: 17 mmol/L — ABNORMAL LOW (ref 22–32)
Calcium: 10.2 mg/dL (ref 8.9–10.3)
Chloride: 104 mmol/L (ref 98–111)
Creatinine, Ser: 1.02 mg/dL — ABNORMAL HIGH (ref 0.44–1.00)
GFR, Estimated: 55 mL/min — ABNORMAL LOW (ref >60)
Glucose, Bld: 229 mg/dL — ABNORMAL HIGH (ref 70–99)
Potassium: 4 mmol/L (ref 3.5–5.1)
Sodium: 137 mmol/L (ref 135–145)
Total Bilirubin: 0.5 mg/dL (ref 0.3–1.2)
Total Protein: 6.5 g/dL (ref 6.5–8.1)

## 2022-01-11 LAB — URINALYSIS, ROUTINE W REFLEX MICROSCOPIC
Bilirubin Urine: NEGATIVE
Glucose, UA: NEGATIVE mg/dL
Hgb urine dipstick: NEGATIVE
Ketones, ur: NEGATIVE mg/dL
Nitrite: NEGATIVE
Protein, ur: 30 mg/dL — AB
Specific Gravity, Urine: >1.03 — ABNORMAL HIGH (ref 1.005–1.030)
pH: 6 (ref 5.0–8.0)

## 2022-01-11 LAB — CBC WITH DIFFERENTIAL/PLATELET
Abs Immature Granulocytes: 0.07 10*3/uL (ref 0.00–0.07)
Basophils Absolute: 0.1 10*3/uL (ref 0.0–0.1)
Basophils Relative: 1 %
Eosinophils Absolute: 0.1 10*3/uL (ref 0.0–0.5)
Eosinophils Relative: 1 %
HCT: 46.8 % — ABNORMAL HIGH (ref 36.0–46.0)
Hemoglobin: 15.4 g/dL — ABNORMAL HIGH (ref 12.0–15.0)
Immature Granulocytes: 1 %
Lymphocytes Relative: 8 %
Lymphs Abs: 1 10*3/uL (ref 0.7–4.0)
MCH: 30.3 pg (ref 26.0–34.0)
MCHC: 32.9 g/dL (ref 30.0–36.0)
MCV: 92.1 fL (ref 80.0–100.0)
Monocytes Absolute: 0.8 10*3/uL (ref 0.1–1.0)
Monocytes Relative: 6 %
Neutro Abs: 11.1 10*3/uL — ABNORMAL HIGH (ref 1.7–7.7)
Neutrophils Relative %: 83 %
Platelets: 221 10*3/uL (ref 150–400)
RBC: 5.08 MIL/uL (ref 3.87–5.11)
RDW: 15.3 % (ref 11.5–15.5)
WBC: 13.2 10*3/uL — ABNORMAL HIGH (ref 4.0–10.5)
nRBC: 0 % (ref 0.0–0.2)

## 2022-01-11 LAB — CBG MONITORING, ED: Glucose-Capillary: 237 mg/dL — ABNORMAL HIGH (ref 70–99)

## 2022-01-11 LAB — URINALYSIS, MICROSCOPIC (REFLEX): WBC, UA: >50 WBC/hpf (ref 0–5)

## 2022-01-11 LAB — LACTIC ACID, PLASMA: Lactic Acid, Venous: 5 mmol/L (ref 0.5–1.9)

## 2022-01-11 LAB — ETHANOL: Alcohol, Ethyl (B): <10 mg/dL (ref <10)

## 2022-01-11 LAB — AMMONIA: Ammonia: 13 umol/L (ref 9–35)

## 2022-01-11 MED ORDER — SODIUM CHLORIDE 0.9 % IV BOLUS: 1000.0000 mL | Freq: Once | INTRAVENOUS | Status: AC

## 2022-01-11 MED ORDER — MELATONIN 5 MG PO TABS: 5.0000 mg | ORAL_TABLET | Freq: Every evening | ORAL | Status: DC | PRN

## 2022-01-11 MED ORDER — PANTOPRAZOLE SODIUM 40 MG PO TBEC
40.0000 mg | DELAYED_RELEASE_TABLET | Freq: Every day | ORAL | Status: DC
  Administered 2022-01-12 – 2022-01-17 (×6): 40 mg via ORAL
  Filled 2022-01-11 (×5): qty 1

## 2022-01-11 MED ORDER — SODIUM CHLORIDE 0.9 % IV SOLN
1.0000 g | Freq: Once | INTRAVENOUS | Status: AC
  Administered 2022-01-11: 1 g via INTRAVENOUS
  Filled 2022-01-11: qty 10

## 2022-01-11 MED ORDER — SODIUM CHLORIDE 0.9 % IV SOLN
2.0000 g | Freq: Two times a day (BID) | INTRAVENOUS | Status: DC
  Administered 2022-01-12 (×3): 2 g via INTRAVENOUS
  Filled 2022-01-11 (×3): qty 12.5

## 2022-01-11 MED ORDER — FENOFIBRATE 160 MG PO TABS
160.0000 mg | ORAL_TABLET | Freq: Every day | ORAL | Status: DC
  Administered 2022-01-12 – 2022-01-17 (×6): 160 mg via ORAL
  Filled 2022-01-11 (×6): qty 1

## 2022-01-11 MED ORDER — ACETAMINOPHEN 325 MG PO TABS
650.0000 mg | ORAL_TABLET | Freq: Four times a day (QID) | ORAL | Status: DC | PRN
  Administered 2022-01-13: 650 mg via ORAL
  Filled 2022-01-11: qty 2

## 2022-01-11 MED ORDER — SODIUM CHLORIDE 0.9 % IV SOLN: 2.0000 g | Freq: Once | INTRAVENOUS | Status: DC

## 2022-01-11 MED ORDER — SODIUM CHLORIDE 0.9 % IV SOLN: INTRAVENOUS | Status: AC

## 2022-01-11 MED ORDER — ZINC OXIDE 13 % EX CREA
1.0000 "application " | TOPICAL_CREAM | Freq: Two times a day (BID) | CUTANEOUS | Status: DC
  Administered 2022-01-11: 1 via CUTANEOUS

## 2022-01-11 MED ORDER — SODIUM CHLORIDE 0.9 % IV BOLUS
1000.0000 mL | Freq: Once | INTRAVENOUS | Status: AC
  Administered 2022-01-11: 1000 mL via INTRAVENOUS

## 2022-01-11 MED ORDER — ROSUVASTATIN CALCIUM 20 MG PO TABS
20.0000 mg | ORAL_TABLET | Freq: Every day | ORAL | Status: DC
  Administered 2022-01-12 – 2022-01-17 (×6): 20 mg via ORAL
  Filled 2022-01-11 (×6): qty 1

## 2022-01-11 MED ORDER — INSULIN ASPART 100 UNIT/ML IJ SOLN
0.0000 [IU] | Freq: Three times a day (TID) | INTRAMUSCULAR | Status: DC
  Administered 2022-01-12 (×3): 5 [IU] via SUBCUTANEOUS
  Administered 2022-01-13: 2 [IU] via SUBCUTANEOUS
  Administered 2022-01-13: 5 [IU] via SUBCUTANEOUS
  Administered 2022-01-13: 3 [IU] via SUBCUTANEOUS
  Administered 2022-01-14: 7 [IU] via SUBCUTANEOUS
  Administered 2022-01-14: 3 [IU] via SUBCUTANEOUS
  Administered 2022-01-14: 5 [IU] via SUBCUTANEOUS
  Administered 2022-01-15: 2 [IU] via SUBCUTANEOUS
  Administered 2022-01-15: 3 [IU] via SUBCUTANEOUS
  Administered 2022-01-15 – 2022-01-16 (×2): 2 [IU] via SUBCUTANEOUS
  Administered 2022-01-16: 3 [IU] via SUBCUTANEOUS
  Administered 2022-01-16 – 2022-01-17 (×2): 5 [IU] via SUBCUTANEOUS

## 2022-01-11 MED ORDER — POLYETHYLENE GLYCOL 3350 17 G PO PACK: 17.0000 g | PACK | Freq: Every day | ORAL | Status: DC | PRN

## 2022-01-11 MED ORDER — PROCHLORPERAZINE EDISYLATE 10 MG/2ML IJ SOLN: 5.0000 mg | Freq: Four times a day (QID) | INTRAMUSCULAR | Status: DC | PRN

## 2022-01-11 MED ORDER — ENOXAPARIN SODIUM 60 MG/0.6ML IJ SOSY: 0.5000 mg/kg | PREFILLED_SYRINGE | INTRAMUSCULAR | Status: DC

## 2022-01-11 MED ORDER — SODIUM CHLORIDE 0.9 % IV SOLN: 2.0000 g | INTRAVENOUS | Status: DC

## 2022-01-11 MED ORDER — INSULIN ASPART 100 UNIT/ML IJ SOLN
0.0000 [IU] | Freq: Every day | INTRAMUSCULAR | Status: DC
  Administered 2022-01-12 – 2022-01-13 (×2): 2 [IU] via SUBCUTANEOUS
  Administered 2022-01-14: 4 [IU] via SUBCUTANEOUS
  Administered 2022-01-15: 3 [IU] via SUBCUTANEOUS
  Administered 2022-01-16: 2 [IU] via SUBCUTANEOUS

## 2022-01-11 NOTE — Progress Notes (Signed)
Pharmacy Antibiotic Note  Cassandra Dodson is a 81 y.o. female admitted on 01/11/2022 with UTI.  Pharmacy has been consulted for cefepime dosing. Patient presented with altered mental status and has a history of psuedomonas aeruginosa UTI, CVA, and brain tumor. WBC elevated, Scr 1.02 (bl~1-1.1), afebrile  Plan: Cefepime 2g q12hr F/u renal function Monitor kidney function, fever curve, WBC, and cultures Weight: 104.1 kg (229 lb 8 oz)  Temp (24hrs), Avg:97.6 F (36.4 C), Min:97.6 F (36.4 C), Max:97.6 F (36.4 C)  Recent Labs  Lab 01/11/22 1940 01/11/22 2128  WBC 13.2*  --   CREATININE  --  1.02*  LATICACIDVEN  --  5.0*     Estimated Creatinine Clearance: 50.9 mL/min (A) (by C-G formula based on SCr of 1.02 mg/dL (H)).    Allergies  Allergen Reactions   Anesthetics, Amide Other (See Comments)    Pt is intolerant to general anesthesia. Pt will throw up and has thrown up during procedure.    Ether Nausea And Vomiting    Antimicrobials this admission: Cefepime 9/22 >>   Microbiology results: 9/22 BCx: pending 9/22 UCx: pending   Thank you for allowing pharmacy to be a part of this patient's care.  Lorelei Pont, PharmD, BCPS 01/11/2022 11:15 PM ED Clinical Pharmacist -  [phone]

## 2022-01-11 NOTE — ED Provider Notes (Signed)
[facility] EMERGENCY DEPARTMENT Provider Note   CSN: [id number] Arrival date & time: 01/11/22  9323     History {Add pertinent medical, surgical, social history, OB history to HPI:1} Chief Complaint  Patient presents with   Altered Mental Status    Cassandra Dodson is a 81 y.o. female.  Level 5 caveat secondary to altered mental status.  81 year old female brought in by ambulance from home for altered mental status since yesterday.  Patient denies any complaints although does not sure why she is here.  She asked me to get her something to eat.  Per her medical record she has a history of stroke and meningioma, basilar stenting. Just returned home from rehab. .  The history is provided by the patient and the EMS personnel.  Altered Mental Status Presenting symptoms: confusion and disorientation   Most recent episode:  Yesterday Episode history:  Continuous Timing:  Constant Progression:  Unchanged Chronicity:  New Associated symptoms: no abdominal pain and no fever        Home Medications Prior to Admission medications   Medication Sig Start Date End Date Taking? Authorizing Provider  ALPRAZolam Duanne Moron) 0.5 MG tablet Take 0.5 tablets (0.25 mg total) by mouth at bedtime as needed for sleep. 11/27/21   Love, Ivan Anchors, PA-C  amLODipine (NORVASC) 10 MG tablet Take 1 tablet (10 mg total) by mouth daily. 11/27/21   Love, Ivan Anchors, PA-C  aspirin EC 81 MG tablet Take 1 tablet (81 mg total) by mouth daily. 11/27/21   Love, Ivan Anchors, PA-C  calcium carbonate (OS-CAL - DOSED IN MG OF ELEMENTAL CALCIUM) 1250 (500 Ca) MG tablet Take 1 tablet (1,250 mg total) by mouth daily with breakfast. 11/28/21   Love, Ivan Anchors, PA-C  clopidogrel (PLAVIX) 75 MG tablet Take 1 tablet (75 mg total) by mouth daily. 11/27/21   Love, Ivan Anchors, PA-C  docusate sodium (COLACE) 100 MG capsule Take 1 capsule (100 mg total) by mouth 2 (two) times daily. 11/27/21   Love, Ivan Anchors, PA-C  fenofibrate 160 MG tablet  Take 1 tablet (160 mg total) by mouth daily after breakfast. 11/27/21   Love, Ivan Anchors, PA-C  folic acid (FOLVITE) 1 MG tablet Take 1 tablet (1 mg total) by mouth daily. 11/27/21   Love, Ivan Anchors, PA-C  insulin glargine (LANTUS) 100 UNIT/ML Solostar Pen Inject 35 Units into the skin daily. 11/27/21   Love, Ivan Anchors, PA-C  insulin lispro (HUMALOG) 100 UNIT/ML KwikPen Inject 1 Units into the skin 3 (three) times daily with meals. 11/27/21   Love, Ivan Anchors, PA-C  Insulin Pen Needle 32G X 4 MM MISC Use daily in the morning, at noon, and at bedtime. 11/27/21   Love, Ivan Anchors, PA-C  methylphenidate (RITALIN) 5 MG tablet Take 1 tablet (5 mg total) by mouth 2 (two) times daily with breakfast and lunch. 11/27/21   Love, Ivan Anchors, PA-C  Mouthwashes (MOUTH RINSE) LIQD solution 15 mLs by Mouth Rinse route as needed (for oral care). 11/05/21   Raiford Noble Latif, DO  pantoprazole (PROTONIX) 40 MG tablet Take 1 tablet (40 mg total) by mouth daily. 11/27/21   Love, Ivan Anchors, PA-C  rosuvastatin (CRESTOR) 20 MG tablet Take 1 tablet (20 mg total) by mouth daily. 11/27/21   Love, Ivan Anchors, PA-C  senna-docusate (SENOKOT-S) 8.6-50 MG tablet Take 2 tablets by mouth daily after breakfast. 11/28/21   Love, Ivan Anchors, PA-C  Zinc Oxide (DESITIN) 13 % CREA Apply 1 application  topically 2 (two) times daily. To buttocks for protection. 11/27/21   Love, Ivan Anchors, PA-C      Allergies    Anesthetics, amide and Ether    Review of Systems   Review of Systems  Unable to perform ROS: Mental status change  Constitutional:  Negative for fever.  Gastrointestinal:  Negative for abdominal pain.  Psychiatric/Behavioral:  Positive for confusion.     Physical Exam Updated Vital Signs BP 116/77   Pulse (!) 45   Resp 18   SpO2 100%  Physical Exam Vitals and nursing note reviewed.  Constitutional:      General: She is not in acute distress.    Appearance: Normal appearance. She is well-developed.  HENT:     Head: Normocephalic and atraumatic.   Eyes:     Conjunctiva/sclera: Conjunctivae normal.  Cardiovascular:     Rate and Rhythm: Normal rate and regular rhythm.     Heart sounds: No murmur heard. Pulmonary:     Effort: Pulmonary effort is normal. No respiratory distress.     Breath sounds: Normal breath sounds.  Abdominal:     Palpations: Abdomen is soft.     Tenderness: There is no abdominal tenderness. There is no guarding or rebound.  Musculoskeletal:        General: No swelling. Normal range of motion.     Cervical back: Neck supple.  Skin:    General: Skin is warm and dry.     Capillary Refill: Capillary refill takes less than 2 seconds.  Neurological:     Mental Status: She is alert. She is disoriented.     Motor: Weakness present.     Comments: She is weak on her left arm and left leg.  Unclear if baseline     ED Results / Procedures / Treatments   Labs (all labs ordered are listed, but only abnormal results are displayed) Labs Reviewed - No data to display  EKG None  Radiology No results found.  Procedures Procedures  {Document cardiac monitor, telemetry assessment procedure when appropriate:1}  Medications Ordered in ED Medications - No data to display  ED Course/ Medical Decision Making/ A&P Clinical Course as of 01/11/22 2022  Fri Jan 11, 2022  1946 Chest x-ray interpreted by me as no acute infiltrates.  Awaiting radiology reading. [MB]    Clinical Course User Index [MB] Hayden Rasmussen, MD                           Medical Decision Making Amount and/or Complexity of Data Reviewed Labs: ordered. Radiology: ordered.  This patient complains of ***; this involves an extensive number of treatment Options and is a complaint that carries with it a high risk of complications and morbidity. The differential includes ***  I ordered, reviewed and interpreted labs, which included *** I ordered medication *** and reviewed PMP when indicated. I ordered imaging studies which included *** and I  independently    visualized and interpreted imaging which showed *** Additional history obtained from *** Previous records obtained and reviewed *** I consulted *** and discussed lab and imaging findings and discussed disposition.  Cardiac monitoring reviewed, *** Social determinants considered, *** Critical Interventions: ***  After the interventions stated above, I reevaluated the patient and found *** Admission and further testing considered, ***    {Document critical care time when appropriate:1} {Document review of labs and clinical decision tools ie heart score, Chads2Vasc2 etc:1}  {Document your  independent review of radiology images, and any outside records:1} {Document your discussion with family members, caretakers, and with consultants:1} {Document social determinants of health affecting pt's care:1} {Document your decision making why or why not admission, treatments were needed:1} Final Clinical Impression(s) / ED Diagnoses Final diagnoses:  None    Rx / DC Orders ED Discharge Orders     None

## 2022-01-11 NOTE — ED Notes (Signed)
Lactic 5.0, Hall MD made aware. Will continue to monitor

## 2022-01-11 NOTE — ED Triage Notes (Signed)
Pt to ER via EMS from home with reports of altered mental status since yesterday AM.  Pt normally A&O x 4, now only A&O x 1.  Hx CVA and brain tumor, EMS reports strong smell of urine.

## 2022-01-11 NOTE — H&P (Incomplete)
History and Physical  Cassandra Dodson:[id number] DOB: 1941/01/21 DOA: 01/11/2022  Referring physician: Dr. Melina Copa, [place]  PCP: Shon Baton, MD  Outpatient Specialists: Gyn oncology Patient coming from: Home  Chief Complaint: Altered mental status   HPI: Cassandra Dodson is a 81 y.o. female with medical history significant for early stage uterine cancer, followed by gyn oncology, hypertension, type 2 diabetes, hypothyroidism, GERD, CKD 3B, chronic anxiety/depression, osteoarthritis, history of CVA and meningioma with left-sided deficit and speech difficulty, who presented to [facility] ED from home via EMS due to confusion for the past 2 days.  The patient is unable to provide a reliable history due to confusion.  Unable to reach the patient's son via phone.  History is mainly obtained from EDP and from review of medical records.  Per EMS, at baseline the patient is alert and oriented x4.  In the ED, work-up revealed CT head with stable meningioma measuring 5 cm and associated vasogenic edema and regional mass effect.  UA was positive for pyuria.  Received 1 dose of Rocephin.  Rocephin switched to cefepime due to recent history of Pseudomonas aeruginosa UTI.  TRH, hospitalist service, was asked to admit.  ED Course: Tmax 97.6.  BP 147/91, pulse 108, respiration rate 26, O2 saturation 97% on room air.  Lab studies remarkable for serum bicarb 17, glucose 229, anion gap 16, lactic acidosis 5.0.  Creatinine 1.02, GFR 55.  WBC 13.2.  Hemoglobin 15.4.  Neutrophil count 11.1.  Review of Systems: Review of systems as noted in the HPI. All other systems reviewed and are negative.   Past Medical History:  Diagnosis Date   Arthritis    CKD (chronic kidney disease), stage III ([place])    patient denies   Depression    Diabetes mellitus without complication ([place])    Difficult intravenous access    Endometrial cancer ([place])    GERD (gastroesophageal reflux disease)    Headache    History of radiation therapy  11/04/2019-12/01/2019   Endometrial HDR; Dr. Gery Pray   Hypertension    Hypothyroidism    Neuromuscular disorder ([place])    neuropathy in feet   Osteoarthritis    PMB (postmenopausal bleeding)    PONV (postoperative nausea and vomiting)    severe nausea and vomiting after knee replacement 06-2014, did ok with 2018 knee replacement   Urinary frequency    Wears glasses    Past Surgical History:  Procedure Laterality Date   BREAST SURGERY     cyst removed   CESAREAN SECTION     DILATION AND CURETTAGE OF UTERUS N/A 06/24/2019   Procedure: DILATATION AND CURETTAGE;  Surgeon: Lafonda Mosses, MD;  Location: [place];  Service: Gynecology;  Laterality: N/A;   FRACTURE SURGERY     right knee   INTRAUTERINE DEVICE (IUD) INSERTION N/A 06/24/2019   Procedure: INTRAUTERINE DEVICE (IUD) INSERTION MIRENA;  Surgeon: Lafonda Mosses, MD;  Location: [facility];  Service: Gynecology;  Laterality: N/A;   IR ANGIO INTRA EXTRACRAN SEL COM CAROTID INNOMINATE UNI R MOD SED  11/05/2021   IR ANGIO VERTEBRAL SEL VERTEBRAL UNI R MOD SED  11/05/2021   IR CT HEAD LTD  11/01/2021   IR INTRA CRAN STENT  11/01/2021   IR RADIOLOGIST EVAL & MGMT  12/19/2021   IR US GUIDE VASC ACCESS RIGHT  11/01/2021   IRRIGATION AND DEBRIDEMENT SEBACEOUS CYST     JOINT REPLACEMENT Right    right   LYMPH NODE DISSECTION  N/A 09/14/2019   Procedure: LYMPH NODE DISSECTION;  Surgeon: Lafonda Mosses, MD;  Location: [facility];  Service: Gynecology;  Laterality: N/A;   RADIOLOGY WITH ANESTHESIA N/A 11/01/2021   Procedure: ANGIOPLASTY/STENT;  Surgeon: Luanne Bras, MD;  Location: [place];  Service: Radiology;  Laterality: N/A;   ROBOTIC ASSISTED TOTAL HYSTERECTOMY WITH BILATERAL SALPINGO OOPHERECTOMY Bilateral 09/14/2019   Procedure: XI ROBOTIC ASSISTED TOTAL HYSTERECTOMY WITH BILATERAL SALPINGO OOPHORECTOMY;  Surgeon: Lafonda Mosses, MD;  Location: [facility];  Service: Gynecology;  Laterality:  Bilateral;   SENTINEL NODE BIOPSY N/A 09/14/2019   Procedure: SENTINEL NODE BIOPSY;  Surgeon: Lafonda Mosses, MD;  Location: [facility];  Service: Gynecology;  Laterality: N/A;   teeth extration     TONSILLECTOMY     TOTAL HIP ARTHROPLASTY Right 02/23/2015   Procedure: RIGHT TOTAL HIP ARTHROPLASTY ANTERIOR APPROACH;  Surgeon: Leandrew Koyanagi, MD;  Location: [place];  Service: Orthopedics;  Laterality: Right;   TOTAL KNEE ARTHROPLASTY Left 06/27/2016   Procedure: LEFT TOTAL KNEE ARTHROPLASTY;  Surgeon: Leandrew Koyanagi, MD;  Location: [place];  Service: Orthopedics;  Laterality: Left;    Social History:  reports that she quit smoking about 19 years ago. Her smoking use included cigarettes. She has a 67.50 pack-year smoking history. She has never used smokeless tobacco. She reports current alcohol use. She reports that she does not use drugs.   Allergies  Allergen Reactions   Anesthetics, Amide Other (See Comments)    Pt is intolerant to general anesthesia. Pt will throw up and has thrown up during procedure.    Ether Nausea And Vomiting    Family History  Problem Relation Age of Onset   Pancreatic cancer Mother    Stroke Father    Hypertension Father    Colon cancer Neg Hx    Breast cancer Neg Hx    Ovarian cancer Neg Hx    Endometrial cancer Neg Hx    Prostate cancer Neg Hx       Prior to Admission medications   Medication Sig Start Date End Date Taking? Authorizing Provider  ALPRAZolam Duanne Moron) 0.5 MG tablet Take 0.5 tablets (0.25 mg total) by mouth at bedtime as needed for sleep. 11/27/21   Love, Ivan Anchors, PA-C  amLODipine (NORVASC) 10 MG tablet Take 1 tablet (10 mg total) by mouth daily. 11/27/21   Love, Ivan Anchors, PA-C  aspirin EC 81 MG tablet Take 1 tablet (81 mg total) by mouth daily. 11/27/21   Love, Ivan Anchors, PA-C  calcium carbonate (OS-CAL - DOSED IN MG OF ELEMENTAL CALCIUM) 1250 (500 Ca) MG tablet Take 1 tablet (1,250 mg total) by mouth daily with breakfast. 11/28/21   Love, Ivan Anchors, PA-C   clopidogrel (PLAVIX) 75 MG tablet Take 1 tablet (75 mg total) by mouth daily. 11/27/21   Love, Ivan Anchors, PA-C  docusate sodium (COLACE) 100 MG capsule Take 1 capsule (100 mg total) by mouth 2 (two) times daily. 11/27/21   Love, Ivan Anchors, PA-C  fenofibrate 160 MG tablet Take 1 tablet (160 mg total) by mouth daily after breakfast. 11/27/21   Love, Ivan Anchors, PA-C  folic acid (FOLVITE) 1 MG tablet Take 1 tablet (1 mg total) by mouth daily. 11/27/21   Love, Ivan Anchors, PA-C  insulin glargine (LANTUS) 100 UNIT/ML Solostar Pen Inject 35 Units into the skin daily. 11/27/21   Love, Ivan Anchors, PA-C  insulin lispro (HUMALOG) 100 UNIT/ML KwikPen Inject 1 Units into the skin 3 (three) times daily with  meals. 11/27/21   Love, Ivan Anchors, PA-C  Insulin Pen Needle 32G X 4 MM MISC Use daily in the morning, at noon, and at bedtime. 11/27/21   Love, Ivan Anchors, PA-C  methylphenidate (RITALIN) 5 MG tablet Take 1 tablet (5 mg total) by mouth 2 (two) times daily with breakfast and lunch. 11/27/21   Love, Ivan Anchors, PA-C  Mouthwashes (MOUTH RINSE) LIQD solution 15 mLs by Mouth Rinse route as needed (for oral care). 11/05/21   Raiford Noble Latif, DO  pantoprazole (PROTONIX) 40 MG tablet Take 1 tablet (40 mg total) by mouth daily. 11/27/21   Love, Ivan Anchors, PA-C  rosuvastatin (CRESTOR) 20 MG tablet Take 1 tablet (20 mg total) by mouth daily. 11/27/21   Love, Ivan Anchors, PA-C  senna-docusate (SENOKOT-S) 8.6-50 MG tablet Take 2 tablets by mouth daily after breakfast. 11/28/21   Love, Ivan Anchors, PA-C  Zinc Oxide (DESITIN) 13 % CREA Apply 1 application  topically 2 (two) times daily. To buttocks for protection. 11/27/21   LoveIvan Anchors, PA-C    Physical Exam: BP (!) 152/94   Pulse (!) 103   Temp 97.6 F (36.4 C) (Oral)   Resp (!) 21   SpO2 100%   General: 81 y.o. year-old female well developed well nourished in no acute distress.  Alert and oriented x3. Cardiovascular: Regular rate and rhythm with no rubs or gallops.  No thyromegaly or JVD noted.  No  lower extremity edema. 2/4 pulses in all 4 extremities. Respiratory: Clear to auscultation with no wheezes or rales. Good inspiratory effort. Abdomen: Soft nontender nondistended with normal bowel sounds x4 quadrants. Muskuloskeletal: No cyanosis, clubbing or edema noted bilaterally Neuro: CN II-XII intact, strength, sensation, reflexes Skin: No ulcerative lesions noted or rashes Psychiatry: Judgement and insight appear normal. Mood is appropriate for condition and setting          Labs on Admission:  Basic Metabolic Panel: Recent Labs  Lab 01/11/22 2128  NA 137  K 4.0  CL 104  CO2 17*  GLUCOSE 229*  BUN 19  CREATININE 1.02*  CALCIUM 10.2   Liver Function Tests: Recent Labs  Lab 01/11/22 2128  AST 21  ALT 8  ALKPHOS 62  BILITOT 0.5  PROT 6.5  ALBUMIN 3.3*   No results for input(s): "LIPASE", "AMYLASE" in the last 168 hours. Recent Labs  Lab 01/11/22 2047  AMMONIA 13   CBC: Recent Labs  Lab 01/11/22 1940  WBC 13.2*  NEUTROABS 11.1*  HGB 15.4*  HCT 46.8*  MCV 92.1  PLT 221   Cardiac Enzymes: No results for input(s): "CKTOTAL", "CKMB", "CKMBINDEX", "TROPONINI" in the last 168 hours.  BNP (last 3 results) No results for input(s): "BNP" in the last 8760 hours.  ProBNP (last 3 results) No results for input(s): "PROBNP" in the last 8760 hours.  CBG: Recent Labs  Lab 01/11/22 2001  GLUCAP 237*    Radiological Exams on Admission: CT Head Wo Contrast  Result Date: 01/11/2022 CLINICAL DATA:  Initial evaluation for mental status change, unknown cause. EXAM: CT HEAD WITHOUT CONTRAST TECHNIQUE: Contiguous axial images were obtained from the base of the skull through the vertex without intravenous contrast. RADIATION DOSE REDUCTION: This exam was performed according to the departmental dose-optimization program which includes automated exposure control, adjustment of the mA and/or kV according to patient size and/or use of iterative reconstruction technique.  COMPARISON:  Prior study from 11/15/2021. FINDINGS: Brain: Age-related cerebral atrophy with chronic small vessel ischemic disease. Chronic right cerebellar  infarct noted. Additional remote lacunar infarcts noted about the thalami. No acute intracranial hemorrhage. No visible acute large vessel territory infarct. Large meningioma positioned along the planum sphenoidale again seen relatively stable in size measuring 5.0 x 4.3 x 3.5 cm. Surrounding vasogenic edema is similar. Associated left-to-right shift of up to approximately 2 cm, stable. No other mass lesion. No hydrocephalus or extra-axial fluid collection. Vascular: Calcified atherosclerosis present at the skull base with vascular stent in place within the basilar. No abnormal hyperdense vessel. Skull: Scalp soft tissues and calvarium within normal limits. Sinuses/Orbits: Globes orbital soft tissues within normal limits. Paranasal sinuses and mastoid air cells are largely clear. Other: None. IMPRESSION: 1. No acute intracranial abnormality. 2. Stable size and appearance of large 5 cm meningioma positioned along the planum sphenoidale with associated vasogenic edema and regional mass effect. 3. Age-related cerebral atrophy with chronic small vessel ischemic disease with chronic right cerebellar and thalamic infarcts. Electronically Signed   By: Jeannine Boga M.D.   On: 01/11/2022 20:18   DG Chest Port 1 View  Result Date: 01/11/2022 CLINICAL DATA:  Altered mental status EXAM: PORTABLE CHEST 1 VIEW COMPARISON:  11/16/2021 FINDINGS: Cardiac size is within normal limits. Thoracic aorta is tortuous. Lung fields are clear of any infiltrates or pulmonary edema. There is no pleural effusion or pneumothorax. Degenerative changes are noted in left shoulder. IMPRESSION: No active disease. Electronically Signed   By: Elmer Picker M.D.   On: 01/11/2022 19:41    EKG: I independently viewed the EKG done and my findings are as followed: Sinus tachycardia  rate of 100.  Nonspecific ST-T changes.  QTc 425.  Assessment/Plan Present on Admission:  AMS (altered mental status)  Principal Problem:   AMS (altered mental status)  Acute metabolic encephalopathy in the setting of UTI, POA CT head without contrast with stable meningioma measuring 5 cm and associated vasogenic edema and regional mass effect.  Treat underlying condition Reorient as needed Delirium precautions Fall and aspiration precautions.  Severe sepsis secondary to UTI, POA UA positive for pyuria WBC 13,000, heart rate 108, respiration rate 26, lactic acid 5.0. Follow urine culture, blood cultures x2 peripherally for ID and sensitivities. Most recent urine culture was positive for Pseudomonas aeruginosa, sensitive to cefepime Continue cefepime  Lactic acidosis secondary to sepsis Lactic acid 5.0, trend. 2 L IV fluid boluses ordered in the ED  High anion gap metabolic acidosis likely secondary to lactic acidosis Anion gap 16, serum bicarb 17, lactic acid 5.0 Continue IV fluid hydration, ensure good perfusion Obtain VBG to further assess  Type 2 diabetes with hyperglycemia Last hemoglobin A1c 7.0 on 10/26/2021. Start insulin sliding scale.  GERD Resume home PPI  Hyperlipidemia Resume home Crestor  CKD 3A At baseline with creatinine of 1.02 with GFR of 55. Avoid nephrotoxic agents, dehydration and hypotension. Monitor urine output with strict I's and O's Repeat renal panel in the morning.  Obesity BMI 38 Recommend outpatient weight loss with regular physical activity and healthy diet.   Critical care time: 65 minutes.   DVT prophylaxis: Subcu Lovenox daily  Code Status: Full code  Family Communication: None at bedside  Disposition Plan: Admitted to telemetry medical unit  Consults called: None  Admission status: Inpatient status.   Status is: Inpatient The patient requires at least 2 midnights for further evaluation and treatment of present  condition.   Kayleen Memos MD [facility] Pager [phone]  If 7PM-7AM, please contact night-coverage [email or web address] Password [facility]  01/11/2022, 10:46 PM

## 2022-01-12 ENCOUNTER — Inpatient Hospital Stay (HOSPITAL_COMMUNITY): Payer: Medicare Other

## 2022-01-12 DIAGNOSIS — N390 Urinary tract infection, site not specified: Secondary | ICD-10-CM | POA: Diagnosis not present

## 2022-01-12 DIAGNOSIS — A419 Sepsis, unspecified organism: Secondary | ICD-10-CM | POA: Diagnosis not present

## 2022-01-12 DIAGNOSIS — G9340 Encephalopathy, unspecified: Secondary | ICD-10-CM | POA: Diagnosis not present

## 2022-01-12 DIAGNOSIS — G9389 Other specified disorders of brain: Secondary | ICD-10-CM | POA: Diagnosis not present

## 2022-01-12 DIAGNOSIS — R4182 Altered mental status, unspecified: Secondary | ICD-10-CM | POA: Diagnosis not present

## 2022-01-12 LAB — CBC WITH DIFFERENTIAL/PLATELET
Abs Immature Granulocytes: 0.02 10*3/uL (ref 0.00–0.07)
Basophils Absolute: 0 10*3/uL (ref 0.0–0.1)
Basophils Relative: 0 %
Eosinophils Absolute: 0 10*3/uL (ref 0.0–0.5)
Eosinophils Relative: 0 %
HCT: 39.9 % (ref 36.0–46.0)
Hemoglobin: 13.2 g/dL (ref 12.0–15.0)
Immature Granulocytes: 0 %
Lymphocytes Relative: 8 %
Lymphs Abs: 0.6 10*3/uL — ABNORMAL LOW (ref 0.7–4.0)
MCH: 30.3 pg (ref 26.0–34.0)
MCHC: 33.1 g/dL (ref 30.0–36.0)
MCV: 91.7 fL (ref 80.0–100.0)
Monocytes Absolute: 0.3 10*3/uL (ref 0.1–1.0)
Monocytes Relative: 4 %
Neutro Abs: 6.2 10*3/uL (ref 1.7–7.7)
Neutrophils Relative %: 88 %
Platelets: 165 10*3/uL (ref 150–400)
RBC: 4.35 MIL/uL (ref 3.87–5.11)
RDW: 14.2 % (ref 11.5–15.5)
WBC: 7 10*3/uL (ref 4.0–10.5)
nRBC: 0 % (ref 0.0–0.2)

## 2022-01-12 LAB — BLOOD GAS, VENOUS
Acid-base deficit: 1.1 mmol/L (ref 0.0–2.0)
Bicarbonate: 22.6 mmol/L (ref 20.0–28.0)
O2 Saturation: 73.2 %
Patient temperature: 37.1
pCO2, Ven: 34 mmHg — ABNORMAL LOW (ref 44–60)
pH, Ven: 7.43 (ref 7.25–7.43)
pO2, Ven: 45 mmHg (ref 32–45)

## 2022-01-12 LAB — COMPREHENSIVE METABOLIC PANEL
ALT: 9 U/L (ref 0–44)
AST: 14 U/L — ABNORMAL LOW (ref 15–41)
Albumin: 3 g/dL — ABNORMAL LOW (ref 3.5–5.0)
Alkaline Phosphatase: 58 U/L (ref 38–126)
Anion gap: 10 (ref 5–15)
BUN: 19 mg/dL (ref 8–23)
CO2: 20 mmol/L — ABNORMAL LOW (ref 22–32)
Calcium: 9.5 mg/dL (ref 8.9–10.3)
Chloride: 107 mmol/L (ref 98–111)
Creatinine, Ser: 0.75 mg/dL (ref 0.44–1.00)
GFR, Estimated: >60 mL/min (ref >60)
Glucose, Bld: 254 mg/dL — ABNORMAL HIGH (ref 70–99)
Potassium: 3.9 mmol/L (ref 3.5–5.1)
Sodium: 137 mmol/L (ref 135–145)
Total Bilirubin: 0.4 mg/dL (ref 0.3–1.2)
Total Protein: 6 g/dL — ABNORMAL LOW (ref 6.5–8.1)

## 2022-01-12 LAB — LACTIC ACID, PLASMA
Lactic Acid, Venous: 3.1 mmol/L (ref 0.5–1.9)
Lactic Acid, Venous: 2.1 mmol/L (ref 0.5–1.9)

## 2022-01-12 LAB — PHOSPHORUS: Phosphorus: 2.4 mg/dL — ABNORMAL LOW (ref 2.5–4.6)

## 2022-01-12 LAB — GLUCOSE, CAPILLARY
Glucose-Capillary: 187 mg/dL — ABNORMAL HIGH (ref 70–99)
Glucose-Capillary: 209 mg/dL — ABNORMAL HIGH (ref 70–99)
Glucose-Capillary: 220 mg/dL — ABNORMAL HIGH (ref 70–99)
Glucose-Capillary: 264 mg/dL — ABNORMAL HIGH (ref 70–99)
Glucose-Capillary: 268 mg/dL — ABNORMAL HIGH (ref 70–99)
Glucose-Capillary: 297 mg/dL — ABNORMAL HIGH (ref 70–99)

## 2022-01-12 LAB — MAGNESIUM: Magnesium: 1.6 mg/dL — ABNORMAL LOW (ref 1.7–2.4)

## 2022-01-12 MED ORDER — ASPIRIN 81 MG PO TBEC
81.0000 mg | DELAYED_RELEASE_TABLET | Freq: Every day | ORAL | Status: DC
  Administered 2022-01-12 – 2022-01-17 (×6): 81 mg via ORAL
  Filled 2022-01-12 (×6): qty 1

## 2022-01-12 MED ORDER — CLOPIDOGREL BISULFATE 75 MG PO TABS
75.0000 mg | ORAL_TABLET | Freq: Every day | ORAL | Status: DC
  Administered 2022-01-12 – 2022-01-17 (×6): 75 mg via ORAL
  Filled 2022-01-12 (×5): qty 1

## 2022-01-12 MED ORDER — ALPRAZOLAM 0.25 MG PO TABS: 0.2500 mg | ORAL_TABLET | Freq: Every evening | ORAL | Status: DC | PRN

## 2022-01-12 MED ORDER — AMLODIPINE BESYLATE 10 MG PO TABS
10.0000 mg | ORAL_TABLET | Freq: Every day | ORAL | Status: DC
  Administered 2022-01-12 – 2022-01-15 (×4): 10 mg via ORAL
  Filled 2022-01-12 (×5): qty 1

## 2022-01-12 MED ORDER — MAGNESIUM SULFATE 2 GM/50ML IV SOLN
2.0000 g | Freq: Once | INTRAVENOUS | Status: AC
  Administered 2022-01-12: 2 g via INTRAVENOUS
  Filled 2022-01-12: qty 50

## 2022-01-12 MED ORDER — ENOXAPARIN SODIUM 60 MG/0.6ML IJ SOSY
50.0000 mg | PREFILLED_SYRINGE | INTRAMUSCULAR | Status: DC
  Administered 2022-01-12 – 2022-01-17 (×6): 50 mg via SUBCUTANEOUS
  Filled 2022-01-12 (×6): qty 0.6

## 2022-01-12 MED ORDER — DEXAMETHASONE SODIUM PHOSPHATE 4 MG/ML IJ SOLN
4.0000 mg | INTRAMUSCULAR | Status: AC
  Administered 2022-01-12: 4 mg via INTRAVENOUS
  Filled 2022-01-12: qty 1

## 2022-01-12 MED ORDER — DEXAMETHASONE SODIUM PHOSPHATE 4 MG/ML IJ SOLN
4.0000 mg | INTRAMUSCULAR | Status: AC
  Administered 2022-01-12 – 2022-01-14 (×3): 4 mg via INTRAVENOUS
  Filled 2022-01-12 (×3): qty 1

## 2022-01-12 MED ORDER — FOLIC ACID 1 MG PO TABS
1.0000 mg | ORAL_TABLET | Freq: Every day | ORAL | Status: DC
  Administered 2022-01-12 – 2022-01-17 (×6): 1 mg via ORAL
  Filled 2022-01-12 (×6): qty 1

## 2022-01-12 NOTE — Procedures (Signed)
Patient Name: Cassandra Dodson  MRN: [id number]  Epilepsy Attending: Lora Havens  Referring Physician/Provider: Albertine Patricia, MD Date: 01/12/2022 Duration: 23 mins  Patient history: 81yo f with ams and meningioma. EEG to evaluate for seizure  Level of alertness: Awake  AEDs during EEG study: None  Technical aspects: This EEG study was done with scalp electrodes positioned according to the 10-20 International system of electrode placement. Electrical activity was reviewed with band pass filter of 1-'70Hz'$ , sensitivity of 7 uV/mm, display speed of 25m/sec with a '60Hz'$  notched filter applied as appropriate. EEG data were recorded continuously and digitally stored.  Video monitoring was available and reviewed as appropriate.  Description: The posterior dominant rhythm consists of '8Hz'$  activity of moderate voltage (25-35 uV) seen predominantly in posterior head regions, symmetric and reactive to eye opening and eye closing. EEG showed intermittent generalized and maximal bifrontal 3 to 6 Hz theta-delta slowing. Hyperventilation and photic stimulation were not performed.     ABNORMALITY - Intermittent slow, generalized and maximal bifrontal  IMPRESSION: This study is suggestive of non specific cortical dysfunction arising from bifrontal region. Additionally there is mild diffuse encephalopathy, nonspecific etiology. No seizures or epileptiform discharges were seen throughout the recording.   Jesalyn Finazzo OBarbra Sarks

## 2022-01-12 NOTE — Progress Notes (Signed)
EEG complete - results pending 

## 2022-01-12 NOTE — Progress Notes (Signed)
PROGRESS NOTE    Cassandra Dodson  ZES:[id number] DOB: Jun 18, 1940 DOA: 01/11/2022 PCP: Shon Baton, MD    Chief Complaint  Patient presents with   Altered Mental Status    Brief Narrative:   Cassandra Dodson is a 81 y.o. female with medical history significant for early stage uterine cancer, followed by gyn oncology, hypertension, type 2 diabetes, hypothyroidism, GERD, CKD 3B, chronic anxiety/depression, osteoarthritis, history of CVA and meningioma with left-sided deficit and speech difficulty, who presented to [facility] ED from home via EMS due to confusion for the past 2 days.   -Her work-up was significant for UTI, as well known meningioma 5 cm with local mass effect and edema, but seems to be stable from most recent imaging last July.      Assessment & Plan:   Principal Problem:   AMS (altered mental status) Active Problems:   Endometrial cancer ([place])   Hypertension   Brain mass   CVA (cerebral vascular accident) ([place])   Stage 3b chronic kidney disease ([place])   UTI (urinary tract infection)   Type 2 diabetes mellitus with stage 3b chronic kidney disease, without long-term current use of insulin (H[facility])    Acute metabolic encephalopathy in the setting of UTI and meningioma with vasogenic edema, POA - CT head without contrast with stable meningioma measuring 5 cm and associated vasogenic edema and regional mass effect.  - Treat underlying conditions - Reorient as needed - Delirium precautions - Fall and aspiration precautions. -We will obtain EEG due to the presence of meningioma t  5 cm meningioma with associated vasogenic edema and regional mass effect -This appears to be stable from most recent imaging last July, I have discussed with neurosurgery to evaluate imaging regarding further recommendation. -She will be kept empirically on Decadron for now.   Severe sepsis secondary to UTI, POA UA positive for pyuria WBC 13,000, heart rate 108, respiration rate 26, lactic acid  5.0. Follow urine culture, blood cultures x2 peripherally for ID and sensitivities. Most recent urine culture was positive for Pseudomonas aeruginosa, sensitive to cefepime Continue cefepime   High anion gap metabolic acidosis likely secondary to lactic acidosis Anion gap 16, serum bicarb 17, lactic acid 5.0 Continue IV fluid hydration,    Type 2 diabetes with hyperglycemia Last hemoglobin A1c 7.0 on 10/26/2021. Start insulin sliding scale.   GERD Resume home PPI   Hyperlipidemia Resume home Crestor   CKD 3A At baseline with creatinine of 1.02 with GFR of 55. Avoid nephrotoxic agents, dehydration and hypotension. Monitor urine output with strict I's and O's  Obesity BMI 38 Recommend outpatient weight loss with regular physical activity and healthy diet.   History of CVA and left-sided deficits Resume home DAPT, fenofibrate, Crestor. PT OT to assess Fall precautions   Hypomagnesemia - Repleted, recheck in a.m.  DVT prophylaxis: Lovenox Code Status: Full Family Communication: None at bedside, unable to reach son by phone Disposition:   Status is: Inpatient    Consultants:  Will discuss with neurosurgery   Subjective:  No significant events overnight as discussed with staff, patient unable to provide any reliable complaints  Objective: Vitals:   01/11/22 2342 01/12/22 0015 01/12/22 0320 01/12/22 0839  BP:  (!) 143/88 (!) 154/101 (!) 133/91  Pulse:  (!) 103 64 (!) 101  Resp:  '20 18 16  '$ Temp: (!) 97.5 F (36.4 C) 98.1 F (36.7 C) 98.7 F (37.1 C) 99.2 F (37.3 C)  TempSrc: Oral Oral Oral Axillary  SpO2:  99%  100% 100%  Weight:        Intake/Output Summary (Last 24 hours) at 01/12/2022 1055 Last data filed at 01/12/2022 0500 Gross per 24 hour  Intake 755.87 ml  Output 220 ml  Net 535.87 ml   Filed Weights   01/11/22 2303  Weight: 104.1 kg    Examination:  Awake Alert, difficultly confused, frail, chronically ill-appearing Symmetrical Chest wall  movement, Good air movement bilaterally, CTAB RRR,No Gallops,Rubs or new Murmurs, No Parasternal Heave +ve B.Sounds, Abd Soft, No tenderness, No rebound - guarding or rigidity. No Cyanosis, Clubbing or edema, No new Rash or bruise       Data Reviewed: I have personally reviewed following labs and imaging studies  CBC: Recent Labs  Lab 01/11/22 1940 01/12/22 0654  WBC 13.2* 7.0  NEUTROABS 11.1* 6.2  HGB 15.4* 13.2  HCT 46.8* 39.9  MCV 92.1 91.7  PLT 221 546    Basic Metabolic Panel: Recent Labs  Lab 01/11/22 2128 01/12/22 0654  NA 137 137  K 4.0 3.9  CL 104 107  CO2 17* 20*  GLUCOSE 229* 254*  BUN 19 19  CREATININE 1.02* 0.75  CALCIUM 10.2 9.5  MG  --  1.6*  PHOS  --  2.4*    GFR: Estimated Creatinine Clearance: 64.9 mL/min (by C-G formula based on SCr of 0.75 mg/dL).  Liver Function Tests: Recent Labs  Lab 01/11/22 2128 01/12/22 0654  AST 21 14*  ALT 8 9  ALKPHOS 62 58  BILITOT 0.5 0.4  PROT 6.5 6.0*  ALBUMIN 3.3* 3.0*    CBG: Recent Labs  Lab 01/11/22 2001 01/12/22 0018 01/12/22 0602  GLUCAP 237* 209* 264*     No results found for this or any previous visit (from the past 240 hour(s)).       Radiology Studies: CT Head Wo Contrast  Result Date: 01/11/2022 CLINICAL DATA:  Initial evaluation for mental status change, unknown cause. EXAM: CT HEAD WITHOUT CONTRAST TECHNIQUE: Contiguous axial images were obtained from the base of the skull through the vertex without intravenous contrast. RADIATION DOSE REDUCTION: This exam was performed according to the departmental dose-optimization program which includes automated exposure control, adjustment of the mA and/or kV according to patient size and/or use of iterative reconstruction technique. COMPARISON:  Prior study from 11/15/2021. FINDINGS: Brain: Age-related cerebral atrophy with chronic small vessel ischemic disease. Chronic right cerebellar infarct noted. Additional remote lacunar infarcts noted  about the thalami. No acute intracranial hemorrhage. No visible acute large vessel territory infarct. Large meningioma positioned along the planum sphenoidale again seen relatively stable in size measuring 5.0 x 4.3 x 3.5 cm. Surrounding vasogenic edema is similar. Associated left-to-right shift of up to approximately 2 cm, stable. No other mass lesion. No hydrocephalus or extra-axial fluid collection. Vascular: Calcified atherosclerosis present at the skull base with vascular stent in place within the basilar. No abnormal hyperdense vessel. Skull: Scalp soft tissues and calvarium within normal limits. Sinuses/Orbits: Globes orbital soft tissues within normal limits. Paranasal sinuses and mastoid air cells are largely clear. Other: None. IMPRESSION: 1. No acute intracranial abnormality. 2. Stable size and appearance of large 5 cm meningioma positioned along the planum sphenoidale with associated vasogenic edema and regional mass effect. 3. Age-related cerebral atrophy with chronic small vessel ischemic disease with chronic right cerebellar and thalamic infarcts. Electronically Signed   By: Jeannine Boga M.D.   On: 01/11/2022 20:18   DG Chest Port 1 View  Result Date: 01/11/2022 CLINICAL DATA:  Altered mental  status EXAM: PORTABLE CHEST 1 VIEW COMPARISON:  11/16/2021 FINDINGS: Cardiac size is within normal limits. Thoracic aorta is tortuous. Lung fields are clear of any infiltrates or pulmonary edema. There is no pleural effusion or pneumothorax. Degenerative changes are noted in left shoulder. IMPRESSION: No active disease. Electronically Signed   By: Elmer Picker M.D.   On: 01/11/2022 19:41        Scheduled Meds:  amLODipine  10 mg Oral Daily   aspirin EC  81 mg Oral Daily   clopidogrel  75 mg Oral Daily   dexamethasone (DECADRON) injection  4 mg Intravenous Q24H   enoxaparin (LOVENOX) injection  50 mg Subcutaneous Q24H   fenofibrate  160 mg Oral QPC breakfast   folic acid  1 mg Oral  Daily   insulin aspart  0-5 Units Subcutaneous QHS   insulin aspart  0-9 Units Subcutaneous TID WC   pantoprazole  40 mg Oral Daily   rosuvastatin  20 mg Oral Daily   Continuous Infusions:  sodium chloride 75 mL/hr at 01/12/22 0016   ceFEPime (MAXIPIME) IV 2 g (01/12/22 1050)   magnesium sulfate bolus IVPB       LOS: 1 day       Phillips Climes, MD [facility]   To contact the attending provider between 7A-7P or the covering provider during after hours 7P-7A, please log into the web site [email or web address] and access using universal [place] password for that web site. If you do not have the password, please call the hospital operator.  01/12/2022, 10:55 AM

## 2022-01-12 NOTE — Evaluation (Signed)
Physical Therapy Evaluation Patient Details Name: Cassandra Dodson MRN: [id number] DOB: [birth year] Today's Date: 01/12/2022  History of Present Illness  Pt is an 81 y/o female admitted 7/6 with slurred speech and L sided weakness. 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 PMH including but not limited to HTN, DM, CKD3, GERD and endometrial cancer.  Clinical Impression  Patient presents with decreased mobility due to generalized weakness, decreased balance, decreased activity tolerance and decreased cognition.  Unknown prior level, but left rehab couple months ago with hoyer lift and specialized wheelchair.  Patient may benefit from follow up STSNF level rehab at d/c.  However, if family able to provide appropriate level of care and wish for home, would recommend home with HHPT.  PT will follow acutely.        Recommendations for follow up therapy are one component of a multi-disciplinary discharge planning process, led by the attending physician.  Recommendations may be updated based on patient status, additional functional criteria and insurance authorization.  Follow Up Recommendations Skilled nursing-short term rehab (<3 hours/day) Can patient physically be transported by private vehicle: No    Assistance Recommended at Discharge Frequent or constant Supervision/Assistance  Patient can return home with the following  Two people to help with walking and/or transfers;Two people to help with bathing/dressing/bathroom;Assist for transportation;Assistance with cooking/housework;Direct supervision/assist for medications management;Help with stairs or ramp for entrance    Equipment  Recommendations None recommended by PT  Recommendations for Other Services       Functional Status Assessment Patient has had a recent decline in their functional status and demonstrates the ability to make significant improvements in function in a reasonable and predictable amount of time.     Precautions / Restrictions Precautions Precautions: Fall Precaution Comments: incontinence Restrictions Weight Bearing Restrictions: No      Mobility  Bed Mobility Overal bed mobility: Needs Assistance Bed Mobility: Rolling, Sidelying to Sit Rolling: Mod assist, +2 for physical assistance, +2 for safety/equipment Sidelying to sit: Max assist, +2 for physical assistance, +2 for safety/equipment, HOB elevated       General bed mobility comments: multimodal cues for all aspects of bed mobility, decreased initation and assist for BLE management as well as trunk elevation    Transfers Overall transfer level: Needs assistance Equipment used: Rolling walker (2 wheels), Ambulation equipment used Transfers: Sit to/from Stand, Bed to chair/wheelchair/BSC Sit to Stand: Max assist, +2 physical assistance, +2 safety/equipment, From elevated surface           General transfer comment: L knee buckling with WB, heavy use of bed pad to assist with lift. required stedy to transfer safely Transfer via Lift Equipment: Stedy  Ambulation/Gait                  Stairs            Wheelchair Mobility    Modified Rankin (Stroke Patients Only)       Balance Overall balance assessment: Needs assistance   Sitting balance-Leahy Scale: Poor Sitting balance - Comments: required min to mod A for sittinG EOB due to R lateral lean Postural control: Posterior lean, Right lateral lean Standing balance support: Bilateral upper extremity supported, Reliant on assistive device  for balance Standing balance-Leahy Scale: Zero Standing balance comment: required +2 assist or stedy                              Pertinent Vitals/Pain Pain Assessment Pain Assessment: Faces Faces Pain Scale: Hurts little more Pain Location: knees with flexion in bed Pain Descriptors / Indicators: Discomfort, Aching Pain Intervention(s): Monitored during session, Limited activity within patient's tolerance    Home Living Family/patient expects to be discharged to:: Private residence   Available Help at Discharge: Family;Available 24 hours/day Type of Home: Apartment Home Access: Level entry       Home Layout: One level Home Equipment: Conservation officer, nature (2 wheels);Cane - single point;Shower seat;Other (comment);Wheelchair - manual Additional Comments: Patient unable to provide accurate history; noted from last stay on inpatient rehab 2 months ago, went home with hoyer lift and wheelchair at max A level.  Attempted to reach son by phone, but no answer    Prior Function Prior Level of Function : Patient poor historian/Family not available             Mobility Comments: pt stating that she ambulates with use of a RW ADLs Comments: from previous admission she requires assistance from her son or an aide to get dressed and sponge bathe. stated that she has an aide 2-3x/wk for "a few hours"     Hand Dominance   Dominant Hand: Right    Extremity/Trunk Assessment   Upper Extremity Assessment Upper Extremity Assessment: Defer to OT evaluation LUE Deficits / Details: think baseline from previous CVA (residual weakness in the chart) but no family present to confirm. edema in hand - able to perform extension and flexion of digits with visual and auditory cues - and elevated on pillow at end of session to assist with edema LUE Coordination: decreased gross motor (baseline?)    Lower Extremity Assessment Lower Extremity Assessment: RLE deficits/detail;LLE deficits/detail RLE Deficits / Details: AAROM WFL, but painful with full knee flexion, lifts leg antigravity, but not sustained LLE Deficits  / Details: AAROM WFL, but painful with full knee flexion, does note lift antigravity    Cervical / Trunk Assessment Cervical / Trunk Assessment: Kyphotic;Other exceptions  Communication   Communication: HOH  Cognition Arousal/Alertness: Awake/alert Behavior During Therapy: WFL for tasks assessed/performed Overall Cognitive Status: No family/caregiver present to determine baseline cognitive functioning                                 General Comments: pt poor historian, talked about her spouse, but he is not listed in the chart, attempted to contact son, but no answer        General Comments General comments (skin integrity, edema, etc.): HR up to 140 with standing activity    Exercises     Assessment/Plan    PT Assessment Patient needs continued PT services  PT Problem List Decreased strength;Decreased balance;Decreased activity tolerance;Decreased mobility;Impaired tone;Decreased knowledge of use of DME;Decreased cognition       PT Treatment Interventions Functional mobility training;Therapeutic activities;Patient/family education;Neuromuscular re-education;Balance training;Therapeutic exercise;Wheelchair mobility training    PT Goals (Current goals can be found in the Care Plan section)  Acute Rehab PT Goals Patient Stated Goal: none stated PT Goal Formulation: Patient unable to participate in goal setting Time For Goal Achievement: 01/26/22 Potential to Achieve Goals: Fair    Frequency Min 3X/week  Co-evaluation PT/OT/SLP Co-Evaluation/Treatment: Yes Reason for Co-Treatment: Complexity of the patient's impairments (multi-system involvement);Necessary to address cognition/behavior during functional activity;For patient/therapist safety PT goals addressed during session: Mobility/safety with mobility;Balance;Proper use of DME OT goals addressed during session: ADL's and self-care;Strengthening/ROM;Other (comment) (cognition)       AM-PAC PT "6  Clicks" Mobility  Outcome Measure Help needed turning from your back to your side while in a flat bed without using bedrails?: Total Help needed moving from lying on your back to sitting on the side of a flat bed without using bedrails?: Total Help needed moving to and from a bed to a chair (including a wheelchair)?: Total Help needed standing up from a chair using your arms (e.g., wheelchair or bedside chair)?: Total Help needed to walk in hospital room?: Total Help needed climbing 3-5 steps with a railing? : Total 6 Click Score: 6    End of Session Equipment Utilized During Treatment: Gait belt Activity Tolerance: Patient limited by fatigue Patient left: in chair;with call bell/phone within reach;with chair alarm set Nurse Communication: Mobility status;Need for lift equipment PT Visit Diagnosis: Other abnormalities of gait and mobility (R26.89);Other symptoms and signs involving the nervous system (R29.898)    Time: 1020-1059 PT Time Calculation (min) (ACUTE ONLY): 39 min   Charges:   PT Evaluation $PT Eval Moderate Complexity: 1 Mod          Cyndi Eulalah Rupert, PT Acute Rehabilitation Services Office:[phone] 01/12/2022   Reginia Naas 01/12/2022, 1:38 PM

## 2022-01-12 NOTE — Evaluation (Signed)
Occupational Therapy Evaluation Patient Details Name: Cassandra Dodson MRN: [id number] DOB: Mar 18, 1941 Today's Date: 01/12/2022   History of Present Illness Cassandra Dodson is a 81 y.o. female admitted 01/11/22 with confusion. Her work-up was significant for UTI, as well known meningioma 5 cm with local mass effect and edema, but seems to be stable from most recent imaging last July PMH includes early stage uterine cancer, followed by gyn oncology, hypertension, type 2 diabetes, hypothyroidism, GERD, CKD 3B, chronic anxiety/depression, osteoarthritis, history of CVA and meningioma with left-sided deficit and speech difficulty.   Clinical Impression   Unsure of PLOF as patient is not a reliable historian, and no family present. Attempted to call son - did not answer phone. Pt stating that she lives in independent living home? Today she demonstrated deficits in cognition, balance, strength, activity tolerance, L sided weakness (baseline?) and incontinence of bowel and urine. Mod A +2 for rolling for peri care at the bed level, max A +2 for sit<>stand and used stedy to get her to the chair. Pt is HOH, but enjoys conversing with therapists and hospital staff. OT will continue to follow acutely and recommending SNF post-acute to maximize safety and independence in ADL and functional transfers. Also recommending palliative consult to address quality of life as well as goals of care     Recommendations for follow up therapy are one component of a multi-disciplinary discharge planning process, led by the attending physician.  Recommendations may be updated based on patient status, additional functional criteria and insurance authorization.   Follow Up Recommendations  Skilled nursing-short term rehab (<3 hours/day)    Assistance Recommended at Discharge Frequent or constant Supervision/Assistance  Patient can return home with the following Two people to help with walking and/or transfers;A lot of help with  bathing/dressing/bathroom;Assistance with feeding;Direct supervision/assist for medications management;Direct supervision/assist for financial management;Assist for transportation;Help with stairs or ramp for entrance    Functional Status Assessment  Patient has had a recent decline in their functional status and/or demonstrates limited ability to make significant improvements in function in a reasonable and predictable amount of time  Equipment Recommendations  Other (comment) (defer to next venue of care)    Recommendations for Other Services PT consult;Speech consult     Precautions / Restrictions Precautions Precautions: Fall Precaution Comments: incontinence Restrictions Weight Bearing Restrictions: No      Mobility Bed Mobility Overal bed mobility: Needs Assistance Bed Mobility: Rolling, Sidelying to Sit Rolling: Mod assist, +2 for physical assistance, +2 for safety/equipment Sidelying to sit: Max assist, +2 for physical assistance, +2 for safety/equipment, HOB elevated       General bed mobility comments: multimodal cues for all aspects of bed mobility, decreased initation and assist for BLE management as well as trunk elevation    Transfers Overall transfer level: Needs assistance Equipment used: Rolling walker (2 wheels), Ambulation equipment used Transfers: Sit to/from Stand, Bed to chair/wheelchair/BSC Sit to Stand: Max assist, +2 physical assistance, +2 safety/equipment, From elevated surface           General transfer comment: L knee buckling with WB, heavy use of bed pad to assist with lift. required stedy to transfer safely Transfer via Lift Equipment: Stedy    Balance Overall balance assessment: Needs assistance Sitting-balance support: Single extremity supported, Feet supported Sitting balance-Leahy Scale: Poor Sitting balance - Comments: required min to mod A for sittinG EOB Postural control: Posterior lean, Right lateral lean Standing balance  support: Bilateral upper extremity supported, Reliant on assistive device  for balance Standing balance-Leahy Scale: Zero Standing balance comment: required +2 assist or stedy                           ADL either performed or assessed with clinical judgement   ADL Overall ADL's : Needs assistance/impaired Eating/Feeding: Minimal assistance;Sitting   Grooming: Minimal assistance;Wash/dry face;Sitting Grooming Details (indicate cue type and reason): for task completion Upper Body Bathing: Moderate assistance   Lower Body Bathing: Maximal assistance   Upper Body Dressing : Moderate assistance   Lower Body Dressing: Total assistance   Toilet Transfer: Maximal assistance;+2 for physical assistance;+2 for safety/equipment Toilet Transfer Details (indicate cue type and reason): requires use of stedy Toileting- Clothing Manipulation and Hygiene: Total assistance;Bed level;+2 for safety/equipment;+2 for physical assistance Toileting - Clothing Manipulation Details (indicate cue type and reason): incontinent of bowel and bladder     Functional mobility during ADLs:  (requires lift equipment currently)       Vision   Additional Comments: remains to be assessed.     Perception     Praxis      Pertinent Vitals/Pain Pain Assessment Pain Assessment: Faces Faces Pain Scale: Hurts little more Pain Location: knees with bending Pain Descriptors / Indicators: Discomfort Pain Intervention(s): Monitored during session, Repositioned     Hand Dominance Right   Extremity/Trunk Assessment Upper Extremity Assessment Upper Extremity Assessment: LUE deficits/detail;Generalized weakness LUE Deficits / Details: think baseline from previous CVA (residual weakness in the chart) but no family present to confirm. edema in hand - able to perform extension and flexion of digits with visual and auditory cues - and elevated on pillow at end of session to assist with edema LUE Coordination:  decreased gross motor (baseline?)   Lower Extremity Assessment Lower Extremity Assessment: Defer to PT evaluation   Cervical / Trunk Assessment Cervical / Trunk Assessment: Kyphotic;Other exceptions (excess body habitus)   Communication Communication Communication: HOH   Cognition Arousal/Alertness: Awake/alert Behavior During Therapy: WFL for tasks assessed/performed Overall Cognitive Status: No family/caregiver present to determine baseline cognitive functioning Area of Impairment: Orientation, Attention, Following commands, Memory, Safety/judgement, Awareness, Problem solving                 Orientation Level: Disoriented to, Situation Current Attention Level: Focused Memory: Decreased short-term memory Following Commands: Follows one step commands inconsistently, Follows one step commands with increased time Safety/Judgement: Decreased awareness of safety, Decreased awareness of deficits Awareness: Intellectual Problem Solving: Slow processing, Decreased initiation, Difficulty sequencing, Requires verbal cues General Comments: incontinent of bowel and bladder with no awareness, giving mixed/conflicting  answers for PLOF, required multimodal cueing for functional tasks.     General Comments  HR up to 140 with standing activity, O2 WFL throughout session. attempted to call son. LVM    Exercises     Shoulder Instructions      Home Living Family/patient expects to be discharged to:: Private residence   Available Help at Discharge: Family;Available 24 hours/day Type of Home: Apartment Home Access: Level entry     Home Layout: One level     Bathroom Shower/Tub: Tub/shower unit         Home Equipment: Conservation officer, nature (2 wheels);Cane - single point;Shower seat;Other (comment);Wheelchair - manual   Additional Comments: Patient unable to provide accurate history; noted from last stay on inpatient rehab 2 months ago, went home with hoyer lift and wheelchair at max A  level.  Attempted to reach son by phone, but no  answer      Prior Functioning/Environment Prior Level of Function : Patient poor historian/Family not available             Mobility Comments: pt stating that she ambulates with use of a RW ADLs Comments: from previous admission she requires assistance from her son or an aide to get dressed and sponge bathe. stated that she has an aide 2-3x/wk for "a few hours"        OT Problem List: Decreased strength;Decreased range of motion;Decreased activity tolerance;Impaired balance (sitting and/or standing);Decreased cognition;Decreased safety awareness;Obesity      OT Treatment/Interventions: Self-care/ADL training    OT Goals(Current goals can be found in the care plan section) Acute Rehab OT Goals Patient Stated Goal: none stated OT Goal Formulation: Patient unable to participate in goal setting Time For Goal Achievement: 01/26/22 Potential to Achieve Goals: Fair  OT Frequency: Min 2X/week    Co-evaluation PT/OT/SLP Co-Evaluation/Treatment: Yes Reason for Co-Treatment: Complexity of the patient's impairments (multi-system involvement);Necessary to address cognition/behavior during functional activity;For patient/therapist safety PT goals addressed during session: Mobility/safety with mobility;Balance;Proper use of DME OT goals addressed during session: ADL's and self-care;Strengthening/ROM;Other (comment) (cognition)      AM-PAC OT "6 Clicks" Daily Activity     Outcome Measure Help from another person eating meals?: A Lot Help from another person taking care of personal grooming?: A Lot Help from another person toileting, which includes using toliet, bedpan, or urinal?: Total Help from another person bathing (including washing, rinsing, drying)?: A Lot Help from another person to put on and taking off regular upper body clothing?: A Lot Help from another person to put on and taking off regular lower body clothing?: Total 6 Click  Score: 10   End of Session Equipment Utilized During Treatment: Gait belt;Other (comment) (stedy) Nurse Communication: Mobility status;Precautions;Need for lift equipment  Activity Tolerance: Patient tolerated treatment well Patient left: in chair;with call bell/phone within reach;with chair alarm set;with nursing/sitter in room  OT Visit Diagnosis: Unsteadiness on feet (R26.81);Other abnormalities of gait and mobility (R26.89);Muscle weakness (generalized) (M62.81);Other symptoms and signs involving the nervous system (R29.898);Other symptoms and signs involving cognitive function                Time: 1023-1059 OT Time Calculation (min): 36 min Charges:  OT General Charges $OT Visit: 1 Visit OT Evaluation $OT Eval Moderate Complexity: [place] OTR/L Acute Rehabilitation Services Office: [place] 01/12/2022, 1:31 PM

## 2022-01-13 ENCOUNTER — Inpatient Hospital Stay (HOSPITAL_COMMUNITY): Payer: Medicare Other

## 2022-01-13 DIAGNOSIS — G9389 Other specified disorders of brain: Secondary | ICD-10-CM | POA: Diagnosis not present

## 2022-01-13 DIAGNOSIS — N1832 Chronic kidney disease, stage 3b: Secondary | ICD-10-CM

## 2022-01-13 DIAGNOSIS — A419 Sepsis, unspecified organism: Secondary | ICD-10-CM | POA: Diagnosis not present

## 2022-01-13 DIAGNOSIS — E1122 Type 2 diabetes mellitus with diabetic chronic kidney disease: Secondary | ICD-10-CM

## 2022-01-13 LAB — MAGNESIUM: Magnesium: 1.8 mg/dL (ref 1.7–2.4)

## 2022-01-13 LAB — GLUCOSE, CAPILLARY
Glucose-Capillary: 175 mg/dL — ABNORMAL HIGH (ref 70–99)
Glucose-Capillary: 243 mg/dL — ABNORMAL HIGH (ref 70–99)
Glucose-Capillary: 260 mg/dL — ABNORMAL HIGH (ref 70–99)
Glucose-Capillary: 233 mg/dL — ABNORMAL HIGH (ref 70–99)

## 2022-01-13 LAB — BASIC METABOLIC PANEL
Anion gap: 9 (ref 5–15)
BUN: 15 mg/dL (ref 8–23)
CO2: 21 mmol/L — ABNORMAL LOW (ref 22–32)
Calcium: 8.7 mg/dL — ABNORMAL LOW (ref 8.9–10.3)
Chloride: 109 mmol/L (ref 98–111)
Creatinine, Ser: 0.69 mg/dL (ref 0.44–1.00)
GFR, Estimated: >60 mL/min (ref >60)
Glucose, Bld: 155 mg/dL — ABNORMAL HIGH (ref 70–99)
Potassium: 3 mmol/L — ABNORMAL LOW (ref 3.5–5.1)
Sodium: 139 mmol/L (ref 135–145)

## 2022-01-13 LAB — CBC
HCT: 35.7 % — ABNORMAL LOW (ref 36.0–46.0)
Hemoglobin: 12.2 g/dL (ref 12.0–15.0)
MCH: 31 pg (ref 26.0–34.0)
MCHC: 34.2 g/dL (ref 30.0–36.0)
MCV: 90.8 fL (ref 80.0–100.0)
Platelets: 147 10*3/uL — ABNORMAL LOW (ref 150–400)
RBC: 3.93 MIL/uL (ref 3.87–5.11)
RDW: 14 % (ref 11.5–15.5)
WBC: 7.5 10*3/uL (ref 4.0–10.5)
nRBC: 0 % (ref 0.0–0.2)

## 2022-01-13 LAB — URINE CULTURE

## 2022-01-13 LAB — PHOSPHORUS: Phosphorus: 1.8 mg/dL — ABNORMAL LOW (ref 2.5–4.6)

## 2022-01-13 MED ORDER — MAGNESIUM SULFATE IN D5W 1-5 GM/100ML-% IV SOLN
1.0000 g | Freq: Once | INTRAVENOUS | Status: AC
  Administered 2022-01-13: 1 g via INTRAVENOUS
  Filled 2022-01-13: qty 100

## 2022-01-13 MED ORDER — GADOPICLENOL 0.5 MMOL/ML IV SOLN
10.0000 mL | Freq: Once | INTRAVENOUS | Status: AC | PRN
  Administered 2022-01-13: 10 mL via INTRAVENOUS

## 2022-01-13 MED ORDER — SODIUM CHLORIDE 0.9 % IV SOLN
2.0000 g | Freq: Three times a day (TID) | INTRAVENOUS | Status: DC
  Administered 2022-01-13 – 2022-01-14 (×3): 2 g via INTRAVENOUS
  Filled 2022-01-13 (×3): qty 12.5

## 2022-01-13 MED ORDER — LORAZEPAM 2 MG/ML IJ SOLN
1.0000 mg | Freq: Once | INTRAMUSCULAR | Status: DC | PRN
  Filled 2022-01-13: qty 1

## 2022-01-13 MED ORDER — POTASSIUM CHLORIDE 10 MEQ/100ML IV SOLN
10.0000 meq | Freq: Once | INTRAVENOUS | Status: AC
  Administered 2022-01-13: 10 meq via INTRAVENOUS
  Filled 2022-01-13: qty 100

## 2022-01-13 MED ORDER — POTASSIUM PHOSPHATES 15 MMOLE/5ML IV SOLN
30.0000 mmol | Freq: Once | INTRAVENOUS | Status: AC
  Administered 2022-01-13: 30 mmol via INTRAVENOUS
  Filled 2022-01-13: qty 10

## 2022-01-13 MED ORDER — POTASSIUM CHLORIDE CRYS ER 20 MEQ PO TBCR
40.0000 meq | EXTENDED_RELEASE_TABLET | Freq: Four times a day (QID) | ORAL | Status: AC
  Administered 2022-01-13 (×2): 40 meq via ORAL
  Filled 2022-01-13 (×2): qty 2

## 2022-01-13 NOTE — TOC Initial Note (Signed)
Transition of Care [facility]) - Initial/Assessment Note    Patient Details  Name: Cassandra Dodson MRN: [id number] Date of Birth: 1940-04-30  Transition of Care [facility]) CM/SW Contact:    Emeterio Reeve, LCSW Phone Number: 01/13/2022, 1:30 PM  Clinical Narrative:                  CSW received SNF consult. CSW met with pt at bedside. Pt is only oriented to self. CSW called pts son Cassandra Dodson. CSW introduced self and explained role at the hospital.  Cassandra Dodson reports that pt lives at home with him. Cassandra Dodson reports that someone is with pt at all times. Cassandra Dodson has hired sitters to be at the home. Cassandra Dodson reports pt already has a hospital bed, wheelchair, and other DME. Cassandra Dodson would like for pt to return home and have HHPT. Cassandra Dodson des not have a preference of facility.   CSW will continue to follow.   Expected Discharge Plan: [place] Barriers to Discharge: Continued Medical Work up   Patient Goals and CMS Choice Patient states their goals for this hospitalization and ongoing recovery are:: Son reports to return home CMS Medicare.gov Compare Post Acute Care list provided to:: Patient Choice offered to / list presented to : Adult Children  Expected Discharge Plan and Services Expected Discharge Plan: [place]       Living arrangements for the past 2 months: Single Family Home                                      Prior Living Arrangements/Services Living arrangements for the past 2 months: Single Family Home Lives with:: Adult Children Patient language and need for interpreter reviewed:: Yes Do you feel safe going back to the place where you live?: Yes      Need for Family Participation in Patient Care: Yes (Comment) Care giver support system in place?: Yes (comment)   Criminal Activity/Legal Involvement Pertinent to Current Situation/Hospitalization: No - Comment as needed  Activities of Daily Living      Permission Sought/Granted Permission  sought to share information with : Case Manager, Customer service manager, Family Supports Permission granted to share information with : Yes, Verbal Permission Granted  Share Information with NAME: Son, Cassandra Dodson           Emotional Assessment Appearance:: Appears stated age Attitude/Demeanor/Rapport: Other (comment) Affect (typically observed): Other (comment) Orientation: : Oriented to Self Alcohol / Substance Use: Not Applicable Psych Involvement: No (comment)  Admission diagnosis:  Altered mental status, unspecified altered mental status type [R41.82] AMS (altered mental status) [R41.82] Patient Active Problem List   Diagnosis Date Noted   AMS (altered mental status) 01/11/2022   Dysphagia 11/28/2021   Anemia 11/28/2021   Fluctuating mental status 11/28/2021   Right pontine cerebrovascular accident ([place]) 11/05/2021   Pressure injury of skin 11/02/2021   Occlusion and stenosis of basilar artery 11/01/2021   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   UTI (urinary tract infection) 10/27/2021   Intracranial mass    Hyperlipidemia    Type 2 diabetes mellitus with stage 3b chronic kidney disease, without long-term current use of insulin (HCC)    CVA (cerebral vascular accident) ([place]) 10/26/2021   Stage 3b chronic kidney disease ([place])    Thrombocytopenia ([place])    History of endometrial cancer    Brain mass 10/25/2021   Endometrial cancer ([place]) 05/13/2019  Hypertension    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 [place] [address] Phone: [phone] Fax: [phone]     Social Determinants of Health (SDOH) Interventions    Readmission Risk Interventions     No data to display          Emeterio Reeve, [place] Social Worker

## 2022-01-13 NOTE — Progress Notes (Addendum)
PROGRESS NOTE    KAYTELYN GLORE  EHM:[id number] DOB: [birth year] DOA: 01/11/2022 PCP: Shon Baton, MD    Chief Complaint  Patient presents with   Altered Mental Status    Brief Narrative:   CLOTEE SCHLICKER is a 81 y.o. 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   -Her work-up was significant for UTI, as well known meningioma 5 cm with local mass effect and edema, but seems to be stable from most recent imaging last July.      Assessment & Plan:   Principal Problem:   AMS (altered mental status) Active Problems:   Endometrial cancer ([place])   Hypertension   Brain mass   CVA (cerebral vascular accident) ([place])   Stage 3b chronic kidney disease ([place])   UTI (urinary tract infection)   Type 2 diabetes mellitus with stage 3b chronic kidney disease, without long-term current use of insulin ([facility])    Acute metabolic encephalopathy in the setting of UTI and meningioma with vasogenic edema, POA - CT head without contrast with stable meningioma measuring 5 cm and associated vasogenic edema and regional mass effect.  - Treat underlying conditions - Reorient as needed - Delirium precautions - Fall and aspiration precautions. -EEG  suggestive of non specific cortical dysfunction arising from bifrontal region. Additionally there is mild diffuse encephalopathy, nonspecific etiology. No seizures or epileptiform discharges were seen throughout the recording  5 cm meningioma with associated vasogenic edema and regional mass effect -This appears to be stable from most recent imaging last July, I have discussed with neurosurgery on call today who reviewed her imaging, no significant changes,  her meningioma without significant change so is unlikely the reason for her AMS. -Continue with Decadron taper   Severe sepsis secondary to UTI, POA UA positive for pyuria WBC 13,000, heart rate 108, respiration rate 26, lactic acid 5.0. Follow urine culture, blood cultures x2 peripherally for ID and sensitivities. Most recent urine culture was positive for Pseudomonas aeruginosa, sensitive to cefepime Continue cefepime   High anion gap metabolic acidosis likely secondary to lactic acidosis Anion gap 16, serum bicarb 17, lactic acid 5.0 Continue IV fluid hydration,    Type 2 diabetes with hyperglycemia Last hemoglobin A1c 7.0 on 10/26/2021. Start insulin sliding scale.   GERD Resume home PPI   Hyperlipidemia Resume home Crestor   CKD 3A At baseline with creatinine of 1.02 with GFR of 55. Avoid nephrotoxic agents, dehydration and hypotension. Monitor urine output with strict I's and O's  Obesity BMI 38 Recommend outpatient weight loss with regular physical activity and healthy diet.   History of CVA and left-sided deficits Resume home DAPT, fenofibrate, Crestor. PT OT to assess Fall precautions   Hypomagnesemia Hypokalemia Hypophosphatemia - Repleted,  DVT prophylaxis: Lovenox Code Status: Full Family Communication: Discussed with son by phone today Disposition:   Status is: Inpatient    Consultants:  Will discuss with neurosurgery   Subjective:  No significant events overnight as discussed with staff, patient denies any complaints today  Objective: Vitals:   01/12/22 2000 01/12/22 2337 01/13/22 0344 01/13/22 0819  BP: (!) 164/112 (!) 149/93 (!) 157/82 (!) 169/97  Pulse: 70 98 93 91  Resp: '20 20 18 15  '$ Temp: 98 F (36.7  C) 98.2 F (36.8 C) 97.8 F (36.6 C) 97.6 F (36.4 C)  TempSrc: Oral Oral Oral Axillary  SpO2: 98% 100% 99% 99%  Weight:        Intake/Output Summary (Last 24 hours) at 01/13/2022 1202 Last data filed at 01/13/2022 0345 Gross  per 24 hour  Intake --  Output 900 ml  Net -900 ml   Filed Weights   01/11/22 2303  Weight: 104.1 kg    Examination:  Awake Alert, difficultly confused, frail, chronically ill-appearing Symmetrical Chest wall movement, Good air movement bilaterally, CTAB RRR,No Gallops,Rubs or new Murmurs, No Parasternal Heave +ve B.Sounds, Abd Soft, No tenderness, No rebound - guarding or rigidity. No Cyanosis, Clubbing or edema, No new Rash or bruise       Data Reviewed: I have personally reviewed following labs and imaging studies  CBC: Recent Labs  Lab 01/11/22 1940 01/12/22 0654 01/13/22 0757  WBC 13.2* 7.0 7.5  NEUTROABS 11.1* 6.2  --   HGB 15.4* 13.2 12.2  HCT 46.8* 39.9 35.7*  MCV 92.1 91.7 90.8  PLT 221 165 147*    Basic Metabolic Panel: Recent Labs  Lab 01/11/22 2128 01/12/22 0654 01/13/22 0757  NA 137 137 139  K 4.0 3.9 3.0*  CL 104 107 109  CO2 17* 20* 21*  GLUCOSE 229* 254* 155*  BUN '19 19 15  '$ CREATININE 1.02* 0.75 0.69  CALCIUM 10.2 9.5 8.7*  MG  --  1.6* 1.8  PHOS  --  2.4* 1.8*    GFR: Estimated Creatinine Clearance: 64.9 mL/min (by C-G formula based on SCr of 0.69 mg/dL).  Liver Function Tests: Recent Labs  Lab 01/11/22 2128 01/12/22 0654  AST 21 14*  ALT 8 9  ALKPHOS 62 58  BILITOT 0.5 0.4  PROT 6.5 6.0*  ALBUMIN 3.3* 3.0*    CBG: Recent Labs  Lab 01/12/22 1122 01/12/22 1713 01/12/22 2156 01/12/22 2338 01/13/22 0604  GLUCAP 268* 297* 187* 220* 175*     Recent Results (from the past 240 hour(s))  Urine Culture     Status: Abnormal   Collection Time: 01/11/22  6:52 PM   Specimen: In/Out Cath Urine  Result Value Ref Range Status   Specimen Description IN/OUT CATH URINE  Final   Special Requests   Final    NONE Performed at [facility], [place] [address], [place], [place] [address]    Culture MULTIPLE SPECIES PRESENT, SUGGEST RECOLLECTION (A)  Final   Report Status 01/13/2022 FINAL  Final  Culture, blood (Routine X 2) w  Reflex to ID Panel     Status: None (Preliminary result)   Collection Time: 01/12/22  3:37 AM   Specimen: BLOOD LEFT HAND  Result Value Ref Range Status   Specimen Description BLOOD LEFT HAND  Final   Special Requests   Final    BOTTLES DRAWN AEROBIC AND ANAEROBIC Blood Culture adequate volume   Culture   Final    NO GROWTH 1 DAY Performed at [facility], [place] [address], [place],  [address]    Report Status PENDING  Incomplete  Culture, blood (Routine X 2) w Reflex to ID Panel     Status: None (Preliminary result)   Collection Time: 01/12/22  3:38 AM   Specimen: BLOOD LEFT HAND  Result Value Ref Range Status   Specimen Description BLOOD LEFT HAND  Final   Special Requests   Final    BOTTLES DRAWN AEROBIC AND ANAEROBIC Blood Culture adequate volume  Culture   Final    NO GROWTH 1 DAY Performed at [facility], [place] [address], [place], [place] [address]    Report Status PENDING  Incomplete         Radiology Studies: EEG adult  Result Date: 2022/01/14 Lora Havens, MD     January 14, 2022  8:39 PM Patient Name: CALAYA GILDNER MRN: [id number] Epilepsy Attending: Lora Havens Referring Physician/Provider: Albertine Patricia, MD Date: 2022-01-14 Duration: 23 mins Patient history: 81yo f with ams and meningioma. EEG to evaluate for seizure Level of alertness: Awake AEDs during EEG study: None Technical aspects: This EEG study was done with scalp electrodes positioned according to the 10-20 International system of electrode placement. Electrical activity was reviewed with band pass filter of 1-'70Hz'$ , sensitivity of 7 uV/mm, display speed of 55m/sec with a '60Hz'$  notched filter applied as appropriate. EEG data were recorded continuously and digitally stored.  Video monitoring was available and reviewed as appropriate. Description: The posterior dominant rhythm consists of '8Hz'$  activity of moderate voltage (25-35 uV) seen predominantly in posterior head regions, symmetric and  reactive to eye opening and eye closing. EEG showed intermittent generalized and maximal bifrontal 3 to 6 Hz theta-delta slowing. Hyperventilation and photic stimulation were not performed.   ABNORMALITY - Intermittent slow, generalized and maximal bifrontal IMPRESSION: This study is suggestive of non specific cortical dysfunction arising from bifrontal region. Additionally there is mild diffuse encephalopathy, nonspecific etiology. No seizures or epileptiform discharges were seen throughout the recording. PLora Havens  CT Head Wo Contrast  Result Date: 01/11/2022 CLINICAL DATA:  Initial evaluation for mental status change, unknown cause. EXAM: CT HEAD WITHOUT CONTRAST TECHNIQUE: Contiguous axial images were obtained from the base of the skull through the vertex without intravenous contrast. RADIATION DOSE REDUCTION: This exam was performed according to the departmental dose-optimization program which includes automated exposure control, adjustment of the mA and/or kV according to patient size and/or use of iterative reconstruction technique. COMPARISON:  Prior study from 11/15/2021. FINDINGS: Brain: Age-related cerebral atrophy with chronic small vessel ischemic disease. Chronic right cerebellar infarct noted. Additional remote lacunar infarcts noted about the thalami. No acute intracranial hemorrhage. No visible acute large vessel territory infarct. Large meningioma positioned along the planum sphenoidale again seen relatively stable in size measuring 5.0 x 4.3 x 3.5 cm. Surrounding vasogenic edema is similar. Associated left-to-right shift of up to approximately 2 cm, stable. No other mass lesion. No hydrocephalus or extra-axial fluid collection. Vascular: Calcified atherosclerosis present at the skull base with vascular stent in place within the basilar. No abnormal hyperdense vessel. Skull: Scalp soft tissues and calvarium within normal limits. Sinuses/Orbits: Globes orbital soft tissues within normal  limits. Paranasal sinuses and mastoid air cells are largely clear. Other: None. IMPRESSION: 1. No acute intracranial abnormality. 2. Stable size and appearance of large 5 cm meningioma positioned along the planum sphenoidale with associated vasogenic edema and regional mass effect. 3. Age-related cerebral atrophy with chronic small vessel ischemic disease with chronic right cerebellar and thalamic infarcts. Electronically Signed   By: BJeannine BogaM.D.   On: 01/11/2022 20:18   DG Chest Port 1 View  Result Date: 01/11/2022 CLINICAL DATA:  Altered mental status EXAM: PORTABLE CHEST 1 VIEW COMPARISON:  11/16/2021 FINDINGS: Cardiac size is within normal limits. Thoracic aorta is tortuous. Lung fields are clear of any infiltrates or pulmonary edema. There is no pleural effusion or pneumothorax. Degenerative changes are noted in left shoulder. IMPRESSION: No active disease.  Electronically Signed   By: Elmer Picker M.D.   On: 01/11/2022 19:41        Scheduled Meds:  amLODipine  10 mg Oral Daily   aspirin EC  81 mg Oral Daily   clopidogrel  75 mg Oral Daily   dexamethasone (DECADRON) injection  4 mg Intravenous Q24H   enoxaparin (LOVENOX) injection  50 mg Subcutaneous Q24H   fenofibrate  160 mg Oral QPC breakfast   folic acid  1 mg Oral Daily   insulin aspart  0-5 Units Subcutaneous QHS   insulin aspart  0-9 Units Subcutaneous TID WC   pantoprazole  40 mg Oral Daily   rosuvastatin  20 mg Oral Daily   Continuous Infusions:  sodium chloride 75 mL/hr at 01/12/22 0016   ceFEPime (MAXIPIME) IV 2 g (01/13/22 1127)     LOS: 2 days       Phillips Climes, MD [facility]   To contact the attending provider between 7A-7P or the covering provider during after hours 7P-7A, please log into the web site [email or web address] and access using universal [place] password for that web site. If you do not have the password, please call the hospital operator.  01/13/2022, 12:02 PM

## 2022-01-14 DIAGNOSIS — N390 Urinary tract infection, site not specified: Secondary | ICD-10-CM | POA: Diagnosis not present

## 2022-01-14 DIAGNOSIS — G9389 Other specified disorders of brain: Secondary | ICD-10-CM | POA: Diagnosis not present

## 2022-01-14 DIAGNOSIS — G9340 Encephalopathy, unspecified: Secondary | ICD-10-CM | POA: Diagnosis not present

## 2022-01-14 LAB — BASIC METABOLIC PANEL
Anion gap: 9 (ref 5–15)
BUN: 16 mg/dL (ref 8–23)
CO2: 21 mmol/L — ABNORMAL LOW (ref 22–32)
Calcium: 9.2 mg/dL (ref 8.9–10.3)
Chloride: 106 mmol/L (ref 98–111)
Creatinine, Ser: 0.81 mg/dL (ref 0.44–1.00)
GFR, Estimated: >60 mL/min (ref >60)
Glucose, Bld: 166 mg/dL — ABNORMAL HIGH (ref 70–99)
Potassium: 4.4 mmol/L (ref 3.5–5.1)
Sodium: 136 mmol/L (ref 135–145)

## 2022-01-14 LAB — CBC
HCT: 37.9 % (ref 36.0–46.0)
Hemoglobin: 12.6 g/dL (ref 12.0–15.0)
MCH: 30.4 pg (ref 26.0–34.0)
MCHC: 33.2 g/dL (ref 30.0–36.0)
MCV: 91.5 fL (ref 80.0–100.0)
Platelets: 176 10*3/uL (ref 150–400)
RBC: 4.14 MIL/uL (ref 3.87–5.11)
RDW: 14 % (ref 11.5–15.5)
WBC: 7.3 10*3/uL (ref 4.0–10.5)
nRBC: 0 % (ref 0.0–0.2)

## 2022-01-14 LAB — GLUCOSE, CAPILLARY
Glucose-Capillary: 206 mg/dL — ABNORMAL HIGH (ref 70–99)
Glucose-Capillary: 273 mg/dL — ABNORMAL HIGH (ref 70–99)
Glucose-Capillary: 319 mg/dL — ABNORMAL HIGH (ref 70–99)
Glucose-Capillary: 302 mg/dL — ABNORMAL HIGH (ref 70–99)

## 2022-01-14 LAB — PHOSPHORUS: Phosphorus: 2.2 mg/dL — ABNORMAL LOW (ref 2.5–4.6)

## 2022-01-14 MED ORDER — SODIUM CHLORIDE 0.9 % IV SOLN
2.0000 g | Freq: Two times a day (BID) | INTRAVENOUS | Status: DC
  Administered 2022-01-14 – 2022-01-16 (×5): 2 g via INTRAVENOUS
  Filled 2022-01-14 (×6): qty 12.5

## 2022-01-14 MED ORDER — LORAZEPAM 2 MG/ML IJ SOLN: 1.0000 mg | Freq: Once | INTRAMUSCULAR | Status: DC

## 2022-01-14 NOTE — Consult Note (Signed)
Neurology Consultation  Reason for Consult: MRI with concern for possible acute infarction within area of a previous infarct from 10/25/2021 Referring Physician: Dr. Waldron Labs  CC: AMS in the setting of UTI  History is obtained from: Patient, chart review, limited history obtained from patient at bedside due to confusion. No family present at bedside.   HPI: Cassandra Dodson is a 81 y.o. 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  Work-up in the ED revealed patient with UTI, stable meningioma, and MRI brain imaging concerning for possible punctate focus of diffusion restriction within the anterior aspect of her previous right pontine infarction from July 2023.  Spoke with her son, and at baseline she does have some confusion, but does not know the year, etc.  On Friday evening, she had an abrupt and sudden change in mental status with recurrence of left-sided weakness.  She had not been having any GI complaints, but suddenly started having profuse vomiting and her left side went completely limp.  When her son was asking what was wrong, she kept saying "left side" and not making much sense beyond this.  She has improved, and he feels like today she was almost back to her baseline.  ROS: A limited ROS was performed and within that limitation is negative except as noted above in the HPI due to patient with altered mental status with ongoing confusion.   Past Medical History:  Diagnosis Date   Arthritis    CKD (chronic kidney disease), stage III ([place])    patient denies   Depression    Diabetes mellitus without complication ([place])     Difficult intravenous access    Endometrial cancer ([place])    GERD (gastroesophageal reflux disease)    Headache    History of radiation therapy 11/04/2019-12/01/2019   Endometrial HDR; Dr. Gery Pray   Hypertension    Hypothyroidism    Neuromuscular disorder ([place])    neuropathy in feet   Osteoarthritis    PMB (postmenopausal bleeding)    PONV (postoperative nausea and vomiting)    severe nausea and vomiting after knee replacement 06-2014, did ok with 2018 knee replacement   Urinary frequency    Wears glasses    Past Surgical History:  Procedure Laterality Date   BREAST SURGERY     cyst removed   CESAREAN SECTION     DILATION AND CURETTAGE OF UTERUS N/A 06/24/2019   Procedure: DILATATION AND CURETTAGE;  Surgeon: Lafonda Mosses, MD;  Location: [facility];  Service: Gynecology;  Laterality: N/A;   FRACTURE SURGERY     right knee   INTRAUTERINE DEVICE (IUD) INSERTION N/A 06/24/2019   Procedure: INTRAUTERINE DEVICE (IUD) INSERTION MIRENA;  Surgeon: Lafonda Mosses, MD;  [facility]: [facility];  Service: Gynecology;  Laterality: N/A;   IR ANGIO INTRA EXTRACRAN SEL COM CAROTID INNOMINATE UNI R MOD SED  11/05/2021   IR ANGIO VERTEBRAL SEL VERTEBRAL UNI R MOD SED  11/05/2021   IR CT HEAD LTD  11/01/2021   IR INTRA CRAN STENT  11/01/2021   IR RADIOLOGIST EVAL & MGMT  12/19/2021   IR US GUIDE VASC ACCESS RIGHT  11/01/2021  IRRIGATION AND DEBRIDEMENT SEBACEOUS CYST     JOINT REPLACEMENT Right    right   LYMPH NODE DISSECTION N/A 09/14/2019   Procedure: LYMPH NODE DISSECTION;  Surgeon: Lafonda Mosses, MD;  Location: [facility];  Service: Gynecology;  Laterality: N/A;   RADIOLOGY WITH ANESTHESIA N/A 11/01/2021   Procedure: ANGIOPLASTY/STENT;  Surgeon: Luanne Bras, MD;  Location: [place] [facility];  Service: Radiology;  Laterality: N/A;   ROBOTIC ASSISTED TOTAL HYSTERECTOMY WITH BILATERAL SALPINGO OOPHERECTOMY Bilateral 09/14/2019   Procedure: XI ROBOTIC ASSISTED  TOTAL HYSTERECTOMY WITH BILATERAL SALPINGO OOPHORECTOMY;  Surgeon: Lafonda Mosses, MD;  Location: [facility];  Service: Gynecology;  Laterality: Bilateral;   SENTINEL NODE BIOPSY N/A 09/14/2019   Procedure: SENTINEL NODE BIOPSY;  Surgeon: Lafonda Mosses, MD;  Location: [facility];  Service: Gynecology;  Laterality: N/A;   teeth extration     TONSILLECTOMY     TOTAL HIP ARTHROPLASTY Right 02/23/2015   Procedure: RIGHT TOTAL HIP ARTHROPLASTY ANTERIOR APPROACH;  Surgeon: Leandrew Koyanagi, MD;  Location: [facility];  Service: Orthopedics;  Laterality: Right;   TOTAL KNEE ARTHROPLASTY Left 06/27/2016   Procedure: LEFT TOTAL KNEE ARTHROPLASTY;  Surgeon: Leandrew Koyanagi, MD;  Location: [facility];  Service: Orthopedics;  Laterality: Left;   Family History  Problem Relation Age of Onset   Pancreatic cancer Mother    Stroke Father    Hypertension Father    Colon cancer Neg Hx    Breast cancer Neg Hx    Ovarian cancer Neg Hx    Endometrial cancer Neg Hx    Prostate cancer Neg Hx    Social History:   reports that she quit smoking about 19 years ago. Her smoking use included cigarettes. She has a 67.50 pack-year smoking history. She has never used smokeless tobacco. She reports current alcohol use. She reports that she does not use drugs.  Medications  Current Facility-Administered Medications:    acetaminophen (TYLENOL) tablet 650 mg, 650 mg, Oral, Q6H PRN, Kayleen Memos, DO, 650 mg at 01/13/22 1125   ALPRAZolam (XANAX) tablet 0.25 mg, 0.25 mg, Oral, QHS PRN, Hall, Carole N, DO   amLODipine (NORVASC) tablet 10 mg, 10 mg, Oral, Daily, Hall, Carole N, DO, 10 mg at 01/14/22 1610   aspirin EC tablet 81 mg, 81 mg, Oral, Daily, Hall, Carole N, DO, 81 mg at 01/14/22 0837   ceFEPIme (MAXIPIME) 2 g in sodium chloride 0.9 % 100 mL IVPB, 2 g, Intravenous, Q8H, Elgergawy, Silver Huguenin, MD, Last Rate: 200 mL/hr at 01/14/22 0523, 2 g at 01/14/22 0523   clopidogrel (PLAVIX) tablet 75 mg, 75 mg, Oral, Daily, Hall, Carole N, DO, 75  mg at 01/14/22 0836   enoxaparin (LOVENOX) injection 50 mg, 50 mg, Subcutaneous, Q24H, Elgergawy, Silver Huguenin, MD, 50 mg at 01/14/22 0840   fenofibrate tablet 160 mg, 160 mg, Oral, QPC breakfast, Irene Pap N, DO, 160 mg at 96/04/54 0981   folic acid (FOLVITE) tablet 1 mg, 1 mg, Oral, Daily, Hall, Carole N, DO, 1 mg at 01/14/22 0837   insulin aspart (novoLOG) injection 0-5 Units, 0-5 Units, Subcutaneous, QHS, Hall, Carole N, DO, 2 Units at 01/13/22 2259   insulin aspart (novoLOG) injection 0-9 Units, 0-9 Units, Subcutaneous, TID WC, Hall, Carole N, DO, 3 Units at 01/14/22 0649   LORazepam (ATIVAN) injection 1 mg, 1 mg, Intravenous, Once PRN, Elgergawy, Silver Huguenin, MD   melatonin tablet 5 mg, 5 mg, Oral, QHS PRN, Hall, Carole N, DO   pantoprazole (PROTONIX) EC  tablet 40 mg, 40 mg, Oral, Daily, Irene Pap N, DO, 40 mg at 01/14/22 0836   polyethylene glycol (MIRALAX / GLYCOLAX) packet 17 g, 17 g, Oral, Daily PRN, [place] Crane, Carole N, DO   prochlorperazine (COMPAZINE) injection 5 mg, 5 mg, Intravenous, Q6H PRN, Hall, Carole N, DO   rosuvastatin (CRESTOR) tablet 20 mg, 20 mg, Oral, Daily, Hall, Carole N, DO, 20 mg at 01/14/22 4010  Exam: Current vital signs: BP (!) 151/99   Pulse 90   Temp 98.3 F (36.8 C)   Resp 16   Wt 104.1 kg   SpO2 100%   BMI 38.19 kg/m  Vital signs in last 24 hours: Temp:  [97.5 F (36.4 C)-98.3 F (36.8 C)] 98.3 F (36.8 C) (09/25 0354) Pulse Rate:  [73-90] 90 (09/25 0837) Resp:  [16-18] 16 (09/25 0837) BP: (146-186)/(88-106) 151/99 (09/25 0837) SpO2:  [95 %-100 %] 100 % (09/25 0837)  GENERAL: Awake, alert, sitting up in bed, in no acute distress Psych: Affect appropriate for situation. Patient is calm, pleasantly confused Head: Normocephalic and atraumatic, without obvious abnormality EENT: Normal conjunctivae, dry mucous membranes, no OP obstruction LUNGS: Normal respiratory effort. Non-labored breathing on room air CV: Regular rate, extremities are without  edema ABDOMEN: Soft, non-tender, non-distended Extremities: Warm, well perfused, without obvious deformity  NEURO:  Mental Status: Awake, alert, and oriented to self and states that she is in the hospital. She incorrectly states that she is 81 years old, the year is 2013, the month is "the last of 19th month", and states that she came to the hospital because "my husband thought it was a good idea and I did 2.  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 She is able to repeat phrases with some limitation during naming objects. Minimal dysarthria noted though patient does have poor dentition. No neglect is noted Cranial Nerves:  II: PERRL. Visual fields full.  III, IV, VI: EOMI. V: Sensation is intact to light touch and symmetrical to face. VII: Mild left facial droop. VIII: Patient is significantly hard of hearing IX, X: Palate elevation is symmetric. Phonation normal.  XI: Shoulder shrug equally XII: Tongue protrudes midline Motor: Right upper and lower extremities 5/5 strength Left upper extremity 3/5 strength, left lower extremity 2/5 with minimal attempt at antigravity movement. LLE moves minimally without gravity. Patient endorses that her left upper extremity strength is "much improved" from previous examination.  Tone is normal. Bulk is normal.  Sensation: Decreased sensation to light touch reported to the left lower extremity compared to the right.  Bilateral upper extremities with symmetric sensation to light touch. Coordination: No overt ataxia noted though patient does have trouble following complex commands. Gait: Deferred for patient safety  Labs I have reviewed labs in epic and the results pertinent to this consultation are: CBC    Component Value Date/Time   WBC 7.3 01/14/2022 0901   RBC 4.14 01/14/2022 0901   HGB 12.6 01/14/2022 0901   HCT 37.9 01/14/2022 0901   PLT 176 01/14/2022 0901   MCV 91.5 01/14/2022  0901   MCH 30.4 01/14/2022 0901   MCHC 33.2 01/14/2022 0901   RDW 14.0 01/14/2022 0901   LYMPHSABS 0.6 (L) 01/12/2022 0654   MONOABS 0.3 01/12/2022 0654   EOSABS 0.0 01/12/2022 0654   BASOSABS 0.0 01/12/2022 0654   CMP     Component Value Date/Time   NA 136 01/14/2022 0901   NA 134 (A) 07/09/2016 0000  K 4.4 01/14/2022 0901   CL 106 01/14/2022 0901   CO2 21 (L) 01/14/2022 0901   GLUCOSE 166 (H) 01/14/2022 0901   BUN 16 01/14/2022 0901   BUN 41 (A) 07/09/2016 0000   CREATININE 0.81 01/14/2022 0901   CALCIUM 9.2 01/14/2022 0901   PROT 6.0 (L) 01/12/2022 0654   ALBUMIN 3.0 (L) 01/12/2022 0654   AST 14 (L) 01/12/2022 0654   ALT 9 01/12/2022 0654   ALKPHOS 58 01/12/2022 0654   BILITOT 0.4 01/12/2022 0654   GFRNONAA >60 01/14/2022 0901   GFRAA 44 (L) 09/03/2019 1527   Lipid Panel     Component Value Date/Time   CHOL 198 10/27/2021 0234   TRIG 147 11/02/2021 0528   HDL 53 10/27/2021 0234   CHOLHDL 3.7 10/27/2021 0234   VLDL 12 10/27/2021 0234   LDLCALC 133 (H) 10/27/2021 0234   Alcohol Level    Component Value Date/Time   [facility] <10 01/11/2022 2047   Lab Results  Component Value Date   TSH 0.993 10/26/2021   Ammonia 13 Blood cultures: NGTD  Imaging I have reviewed the images obtained:  CT-scan of the brain 9/22: 1. No acute intracranial abnormality. 2. Stable size and appearance of large 5 cm meningioma positioned along the planum sphenoidale with associated vasogenic edema and regional mass effect. 3. Age-related cerebral atrophy with chronic small vessel ischemic disease with chronic right cerebellar and thalamic infarcts.  MRI examination of the brain 9/24: 1. Continued evolution of the right paramidline pontine infarct. 2. Punctate focus of diffusion abnormality within the anterior aspect of the previous infarct. This may represent acute infarct of some residual tissue. It is largely encompassed within the evolving prior infarct. 3. Stable appearance of a  sphenoidale meningioma with surrounding vasogenic edema and midline shift. 4. Remote posterior right cerebellar infarct. 5. Remote lacunar infarct of the left thalamus. 6. Stable white matter disease.  Routine EEG 9/23: "This study is suggestive of non specific cortical dysfunction arising from bifrontal region. Additionally there is mild diffuse encephalopathy, nonspecific etiology. No seizures or epileptiform discharges were seen throughout the recording."    Pt seen by NP/Neuro and later by MD. Note/plan to be edited by MD as needed.  Anibal Henderson, AGAC-NP [facility] Pager: [phone]  I have seen the patient and reviewed the above note.  She is not oriented.  She has persistent left-sided weakness that appears to be similar to previously charted  Assessment: 81 y.o. female with PMHx of CKD stage III, depression, DM2, uterine cancer, HTN, OA, recent right pontine infarct 10/25/2021 on dual antiplatelet therapy at home with residual left-sided deficits and dysarthria, and meningioma with vasogenic edema  (stable from imaging in July 2022) who presented to the ED on 9/22 for evaluation of acute onset nausea/confusion/worsening of left-sided weakness.  The abrupt onset coupled with the diffusion positivity on her current MRI does make me think that this could very well have been posterior circulation ischemia.  Certainly the urinary tract infection is likely playing a role in her continued encephalopathy, and agree with treating this.  With her recent stent, I will check platelet inhibition to ensure she is not a Plavix nonresponder.  I will also get a CTA of the head and neck to ensure patency of her stent.  Recommendations: - Continue DAPT, crestor, fenofibrate  - Treatment of UTI per primary team - Continue identification and management of infectious processes per primary team - Delirium precautions  - Ongoing PT/OT evaluation for frequent  falls/appropriate therapy  recommendations -CTA head and neck -P2Y12 platelet inhibition -Stroke team to follow  Roland Rack, MD [facility] [phone]  If 7pm- 7am, please page neurology on call as listed in [place].

## 2022-01-14 NOTE — Progress Notes (Signed)
PROGRESS NOTE    Cassandra Dodson  UYQ:[id number] DOB: [birth year] DOA: 01/11/2022 PCP: Shon Baton, MD    Chief Complaint  Patient presents with   Altered Mental Status    Brief Narrative:   Cassandra Dodson is a 81 y.o. female with medical history significant for early stage uterine cancer, followed by gyn oncology, hypertension, type 2 diabetes, hypothyroidism, GERD, CKD 3B, chronic anxiety/depression, osteoarthritis, history of CVA and meningioma with left-sided deficit and speech difficulty, who presented to [facility] ED from home via EMS due to confusion for the past 2 days.   -Her work-up was significant for UTI, as well known meningioma 5 cm with local mass effect and edema, but seems to be stable from most recent imaging last July.      Assessment & Plan:   Principal Problem:   AMS (altered mental status) Active Problems:   Endometrial cancer ([place])   Hypertension   Brain mass   CVA (cerebral vascular accident) ([place])   Stage 3b chronic kidney disease ([place])   UTI (urinary tract infection)   Type 2 diabetes mellitus with stage 3b chronic kidney disease, without long-term current use of insulin (HCC)    Acute metabolic encephalopathy in the setting of UTI and meningioma with vasogenic edema, POA - CT head without contrast with stable meningioma measuring 5 cm and associated vasogenic edema and regional mass effect.  - Treat underlying conditions - Reorient as needed - Delirium precautions - Fall and aspiration precautions. -EEG  suggestive of non specific cortical dysfunction arising from bifrontal region. Additionally there is mild diffuse encephalopathy, nonspecific etiology. No seizures or epileptiform discharges were seen throughout the recording -MRI obtained yesterday significant for evaluation of her right paramidline pontine infarct, with new punctuate focus fusion abnormality and the previous infarct, possible acute infarct involving in the previous infarct, I have requested  neurology to evaluate regarding these findings.  5 cm meningioma with associated vasogenic edema and regional mass effect -This appears to be stable from most recent imaging last July, have discussed with Dr. Ellene Route who will evaluate the patient tomorrow -CVAT Decadron initially, she is currently off Decadron.    Severe sepsis secondary to UTI, POA UA positive for pyuria WBC 13,000, heart rate 108, respiration rate 26, lactic acid 5.0. Follow urine culture, blood cultures x2 peripherally for ID and sensitivities. Most recent urine culture was positive for Pseudomonas aeruginosa, sensitive to cefepime Continue cefepime   High anion gap metabolic acidosis likely secondary to lactic acidosis Anion gap 16, serum bicarb 17, lactic acid 5.0 Continue IV fluid hydration,    Type 2 diabetes with hyperglycemia Last hemoglobin A1c 7.0 on 10/26/2021. Start insulin sliding scale.   GERD Resume home PPI   Hyperlipidemia Resume home Crestor   CKD 3A At baseline with creatinine of 1.02 with GFR of 55. Avoid nephrotoxic agents, dehydration and hypotension. Monitor urine output with strict I's and O's  Obesity BMI 38 Recommend outpatient weight loss with regular physical activity and healthy diet.   History of CVA and left-sided deficits Resume home DAPT, fenofibrate, Crestor. PT OT to assess Fall precautions   Hypomagnesemia Hypokalemia Hypophosphatemia - Repleted,  DVT prophylaxis: Lovenox Code Status: Full Family Communication: Discussed with son by phone  9/24 Disposition:   Status is: Inpatient    Consultants:  Neurosurgery Dr. Ellene Route to see in a.m. Neurology consulted   Subjective:  No significant events overnight, she denies any complaints today  Objective: Vitals:   01/13/22 2313 01/14/22 0354 01/14/22 6387  01/14/22 1209  BP: (!) 158/90 (!) 149/98 (!) 151/99 (!) 151/76  Pulse: 80 83 90 80  Resp: '16 18 16 16  '$ Temp: 97.7 F (36.5 C) 98.3 F (36.8 C)  98.2 F  (36.8 C)  TempSrc: Oral   Oral  SpO2: 97% 100% 100% 97%  Weight:        Intake/Output Summary (Last 24 hours) at 01/14/2022 1350 Last data filed at 01/14/2022 0651 Gross per 24 hour  Intake 729.59 ml  Output 1900 ml  Net -1170.41 ml   Filed Weights   01/11/22 2303  Weight: 104.1 kg    Examination:  Awake Alert, confused, frail, chronically ill-appearing Symmetrical Chest wall movement, Good air movement bilaterally, CTAB RRR,No Gallops,Rubs or new Murmurs, No Parasternal Heave +ve B.Sounds, Abd Soft, No tenderness, No rebound - guarding or rigidity. No Cyanosis, Clubbing or edema, No new Rash or bruise        Data Reviewed: I have personally reviewed following labs and imaging studies  CBC: Recent Labs  Lab 01/11/22 1940 01/12/22 0654 01/13/22 0757 01/14/22 0901  WBC 13.2* 7.0 7.5 7.3  NEUTROABS 11.1* 6.2  --   --   HGB 15.4* 13.2 12.2 12.6  HCT 46.8* 39.9 35.7* 37.9  MCV 92.1 91.7 90.8 91.5  PLT 221 165 147* 998    Basic Metabolic Panel: Recent Labs  Lab 01/11/22 2128 01/12/22 0654 01/13/22 0757 01/14/22 0901  NA 137 137 139 136  K 4.0 3.9 3.0* 4.4  CL 104 107 109 106  CO2 17* 20* 21* 21*  GLUCOSE 229* 254* 155* 166*  BUN '19 19 15 16  '$ CREATININE 1.02* 0.75 0.69 0.81  CALCIUM 10.2 9.5 8.7* 9.2  MG  --  1.6* 1.8  --   PHOS  --  2.4* 1.8* 2.2*    GFR: Estimated Creatinine Clearance: 64.1 mL/min (by C-G formula based on SCr of 0.81 mg/dL).  Liver Function Tests: Recent Labs  Lab 01/11/22 2128 01/12/22 0654  AST 21 14*  ALT 8 9  ALKPHOS 62 58  BILITOT 0.5 0.4  PROT 6.5 6.0*  ALBUMIN 3.3* 3.0*    CBG: Recent Labs  Lab 01/13/22 1247 01/13/22 1741 01/13/22 2213 01/14/22 0642 01/14/22 1208  GLUCAP 243* 260* 233* 206* 273*     Recent Results (from the past 240 hour(s))  Urine Culture     Status: Abnormal   Collection Time: 01/11/22  6:52 PM   Specimen: In/Out Cath Urine  Result Value Ref Range Status   Specimen Description  IN/OUT CATH URINE  Final   Special Requests   Final    NONE Performed at [facility], [place] [address], [place], [place] [address]    Culture MULTIPLE SPECIES PRESENT, SUGGEST RECOLLECTION (A)  Final   Report Status 01/13/2022 FINAL  Final  Culture, blood (Routine X 2) w Reflex to ID Panel     Status: None (Preliminary result)   Collection Time: 01/12/22  3:37 AM   Specimen: BLOOD LEFT HAND  Result Value Ref Range Status   Specimen Description BLOOD LEFT HAND  Final   Special Requests   Final    BOTTLES DRAWN AEROBIC AND ANAEROBIC Blood Culture adequate volume   Culture   Final    NO GROWTH 1 DAY Performed at [facility], [place] [address], [place], [place] [address]    Report Status PENDING  Incomplete  Culture, blood (Routine X 2) w Reflex to ID Panel     Status: None (  Preliminary result)   Collection Time: 01/12/22  3:38 AM   Specimen: BLOOD LEFT HAND  Result Value Ref Range Status   Specimen Description BLOOD LEFT HAND  Final   Special Requests   Final    BOTTLES DRAWN AEROBIC AND ANAEROBIC Blood Culture adequate volume   Culture   Final    NO GROWTH 1 DAY Performed at [facility], [address] [place], [place] [address]    Report Status PENDING  Incomplete         Radiology Studies: MR BRAIN W WO CONTRAST  Result Date: 01/13/2022 CLINICAL DATA:  Left-sided weakness. Meningioma. EXAM: MRI HEAD WITHOUT AND WITH CONTRAST TECHNIQUE: Multiplanar, multiecho pulse sequences of the brain and surrounding structures were obtained without and with intravenous contrast. CONTRAST:  10 mL Vueway COMPARISON:  MR head without contrast 11/15/2021. MR head without and with contrast 10/25/2021. 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 Surrounding vasogenic edema is stable. Subfalcine midline shift again noted. Edema extends into the left external capsule and superiorly in the frontal lobe. Edema within the anterior  genu of the corpus callosum is stable. Continued evolution of the right paramidline pontine infarct is noted. Remote posterior right cerebellar infarct is present. Remote lacunar infarct is present in the left thalamus. Periventricular white matter changes are stable. Diffusion-weighted images demonstrate a punctate focus of restricted diffusion within the anterior aspect of the right paramedian pontine infarct. Vascular: Abnormal signal is again noted within the left vertebral artery suggesting slower occluded flow. Flow is present in the right vertebral artery and basilar artery. Flow is present in the anterior circulation. Skull and upper cervical spine: The craniocervical junction is normal. Upper cervical spine is within normal limits. Marrow signal is unremarkable. Sinuses/Orbits: The paranasal sinuses and mastoid air cells are clear. The globes and orbits are within normal limits. IMPRESSION: 1. Continued evolution of the right paramidline pontine infarct. 2. Punctate focus of diffusion abnormality within the anterior aspect of the previous infarct. This may represent acute infarct of some residual tissue. It is largely encompassed within the evolving prior infarct. 3. Stable appearance of a sphenoidale meningioma with surrounding vasogenic edema and midline shift. 4. Remote posterior right cerebellar infarct. 5. Remote lacunar infarct of the left thalamus. 6. Stable white matter disease. Electronically Signed   By: San Morelle M.D.   On: 01/13/2022 16:55   EEG adult  Result Date: 01/12/2022 Lora Havens, MD     01/12/2022  8:39 PM Patient Name: HIRA TRENT MRN: [id number] Epilepsy Attending: Lora Havens Referring Physician/Provider: Albertine Patricia, MD Date: 01/12/2022 Duration: 23 mins Patient history: 81yo f with ams and meningioma. EEG to evaluate for seizure Level of alertness: Awake AEDs during EEG study: None Technical aspects: This EEG study was done with scalp electrodes  positioned according to the 10-20 International system of electrode placement. Electrical activity was reviewed with band pass filter of 1-'70Hz'$ , sensitivity of 7 uV/mm, display speed of 31m/sec with a '60Hz'$  notched filter applied as appropriate. EEG data were recorded continuously and digitally stored.  Video monitoring was available and reviewed as appropriate. Description: The posterior dominant rhythm consists of '8Hz'$  activity of moderate voltage (25-35 uV) seen predominantly in posterior head regions, symmetric and reactive to eye opening and eye closing. EEG showed intermittent generalized and maximal bifrontal 3 to 6 Hz theta-delta slowing. Hyperventilation and photic stimulation were not performed.   ABNORMALITY - Intermittent slow,  generalized and maximal bifrontal IMPRESSION: This study is suggestive of non specific cortical dysfunction arising from bifrontal region. Additionally there is mild diffuse encephalopathy, nonspecific etiology. No seizures or epileptiform discharges were seen throughout the recording. Priyanka [name]        Scheduled Meds:  amLODipine  10 mg Oral Daily   aspirin EC  81 mg Oral Daily   clopidogrel  75 mg Oral Daily   enoxaparin (LOVENOX) injection  50 mg Subcutaneous Q24H   fenofibrate  160 mg Oral QPC breakfast   folic acid  1 mg Oral Daily   insulin aspart  0-5 Units Subcutaneous QHS   insulin aspart  0-9 Units Subcutaneous TID WC   pantoprazole  40 mg Oral Daily   rosuvastatin  20 mg Oral Daily   Continuous Infusions:  ceFEPime (MAXIPIME) IV       LOS: 3 days       Phillips Climes, MD [facility]   To contact the attending provider between 7A-7P or the covering provider during after hours 7P-7A, please log into the web site [email or web address] and access using universal [place] password for that web site. If you do not have the password, please call the hospital operator.  01/14/2022, 1:50 PM

## 2022-01-14 NOTE — TOC Progression Note (Signed)
Transition of Care [facility]) - Progression Note    Patient Details  Name: Cassandra Dodson MRN: [id number] Date of Birth: 1940/11/15  Transition of Care [facility]) CM/SW Contact  Pollie Friar, RN Phone Number: 01/14/2022, 3:11 PM  Clinical Narrative:    CM spoke to Cassandra Dodson (son) over the phone and he asked that CM leave choice for [facility] agencies in the room and he will decide in the am.  Cm has left choice.  TOC following.  Expected Discharge Plan: [place] Barriers to Discharge: Continued Medical Work up  Expected Discharge Plan and Services Expected Discharge Plan: [place] arrangements for the past 2 months: Single Family Home                                       Social Determinants of Health (SDOH) Interventions    Readmission Risk Interventions     No data to display

## 2022-01-15 DIAGNOSIS — G9340 Encephalopathy, unspecified: Secondary | ICD-10-CM | POA: Diagnosis not present

## 2022-01-15 DIAGNOSIS — G9389 Other specified disorders of brain: Secondary | ICD-10-CM | POA: Diagnosis not present

## 2022-01-15 DIAGNOSIS — A419 Sepsis, unspecified organism: Secondary | ICD-10-CM | POA: Diagnosis not present

## 2022-01-15 LAB — GLUCOSE, CAPILLARY
Glucose-Capillary: 181 mg/dL — ABNORMAL HIGH (ref 70–99)
Glucose-Capillary: 230 mg/dL — ABNORMAL HIGH (ref 70–99)
Glucose-Capillary: 167 mg/dL — ABNORMAL HIGH (ref 70–99)
Glucose-Capillary: 256 mg/dL — ABNORMAL HIGH (ref 70–99)

## 2022-01-15 MED ORDER — K PHOS MONO-SOD PHOS DI & MONO 155-852-130 MG PO TABS
500.0000 mg | ORAL_TABLET | Freq: Once | ORAL | Status: AC
  Administered 2022-01-15: 500 mg via ORAL
  Filled 2022-01-15: qty 2

## 2022-01-15 MED ORDER — INSULIN GLARGINE-YFGN 100 UNIT/ML ~~LOC~~ SOLN
10.0000 [IU] | Freq: Every day | SUBCUTANEOUS | Status: DC
  Administered 2022-01-15: 10 [IU] via SUBCUTANEOUS
  Filled 2022-01-15 (×2): qty 0.1

## 2022-01-15 NOTE — Consult Note (Signed)
Reason for Consult: Meningioma Referring Physician: Dr. Delila Spence is an 81 y.o. female.  HPI: Cassandra Dodson is an 81 year old individual who was known to me from her previous hospitalization in July.  She presented with a large frontal meningioma but an MRI demonstrated that she also had pontine and posterior cerebral infarcts.  She was treated conservatively and weakness that she had improved substantially.  This past Friday she apparently developed acute weakness in the left side and had a significantly altered mental status.  MRI was repeated the other day and this demonstrates that the patient has meningioma unchanged in its presentation with subfalcine herniation and some vasogenic edema surrounding a large frontal meningioma measuring 5-1/2 cm.  After being started on antibiotics in the last 24 hours her sensorium has cleared substantially.  Weakness appears to be improving also appears that she has extended her pontine infarct slightly.  Past Medical History:  Diagnosis Date   Arthritis    CKD (chronic kidney disease), stage III ([place])    patient denies   Depression    Diabetes mellitus without complication ([place])    Difficult intravenous access    Endometrial cancer ([place])    GERD (gastroesophageal reflux disease)    Headache    History of radiation therapy 11/04/2019-12/01/2019   Endometrial HDR; Dr. Gery Pray   Hypertension    Hypothyroidism    Neuromuscular disorder ([place])    neuropathy in feet   Osteoarthritis    PMB (postmenopausal bleeding)    PONV (postoperative nausea and vomiting)    severe nausea and vomiting after knee replacement 06-2014, did ok with 2018 knee replacement   Urinary frequency    Wears glasses     Past Surgical History:  Procedure Laterality Date   BREAST SURGERY     cyst removed   CESAREAN SECTION     DILATION AND CURETTAGE OF UTERUS N/A 06/24/2019   Procedure: DILATATION AND CURETTAGE;  Surgeon: Lafonda Mosses, MD;  Location:  [facility];  Service: Gynecology;  Laterality: N/A;   FRACTURE SURGERY     right knee   INTRAUTERINE DEVICE (IUD) INSERTION N/A 06/24/2019   Procedure: INTRAUTERINE DEVICE (IUD) INSERTION MIRENA;  Surgeon: Lafonda Mosses, MD;  Location: [facility];  Service: Gynecology;  Laterality: N/A;   IR ANGIO INTRA EXTRACRAN SEL COM CAROTID INNOMINATE UNI R MOD SED  11/05/2021   IR ANGIO VERTEBRAL SEL VERTEBRAL UNI R MOD SED  11/05/2021   IR CT HEAD LTD  11/01/2021   IR INTRA CRAN STENT  11/01/2021   IR RADIOLOGIST EVAL & MGMT  12/19/2021   IR US GUIDE VASC ACCESS RIGHT  11/01/2021   IRRIGATION AND DEBRIDEMENT SEBACEOUS CYST     JOINT REPLACEMENT Right    right   LYMPH NODE DISSECTION N/A 09/14/2019   Procedure: LYMPH NODE DISSECTION;  Surgeon: Lafonda Mosses, MD;  Location: [facility];  Service: Gynecology;  Laterality: N/A;   RADIOLOGY WITH ANESTHESIA N/A 11/01/2021   Procedure: ANGIOPLASTY/STENT;  Surgeon: Luanne Bras, MD;  Location: [facility];  Service: Radiology;  Laterality: N/A;   ROBOTIC ASSISTED TOTAL HYSTERECTOMY WITH BILATERAL SALPINGO OOPHERECTOMY Bilateral 09/14/2019   Procedure: XI ROBOTIC ASSISTED TOTAL HYSTERECTOMY WITH BILATERAL SALPINGO OOPHORECTOMY;  Surgeon: Lafonda Mosses, MD;  Location: [facility];  Service: Gynecology;  Laterality: Bilateral;   SENTINEL NODE BIOPSY N/A 09/14/2019   Procedure: SENTINEL NODE BIOPSY;  Surgeon: Lafonda Mosses, MD;  Location: [facility];  Service: Gynecology;  Laterality: N/A;   teeth extration     TONSILLECTOMY     TOTAL HIP ARTHROPLASTY Right 02/23/2015   Procedure: RIGHT TOTAL HIP ARTHROPLASTY ANTERIOR APPROACH;  Surgeon: Leandrew Koyanagi, MD;  Location: [facility];  Service: Orthopedics;  Laterality: Right;   TOTAL KNEE ARTHROPLASTY Left 06/27/2016   Procedure: LEFT TOTAL KNEE ARTHROPLASTY;  Surgeon: Leandrew Koyanagi, MD;  Location: [facility];  Service: Orthopedics;  Laterality: Left;    Family History  Problem Relation Age of  Onset   Pancreatic cancer Mother    Stroke Father    Hypertension Father    Colon cancer Neg Hx    Breast cancer Neg Hx    Ovarian cancer Neg Hx    Endometrial cancer Neg Hx    Prostate cancer Neg Hx     Social History:  reports that she quit smoking about 19 years ago. Her smoking use included cigarettes. She has a 67.50 pack-year smoking history. She has never used smokeless tobacco. She reports current alcohol use. She reports that she does not use drugs.  Allergies:  Allergies  Allergen Reactions   Anesthetics, Amide Other (See Comments)    Pt is intolerant to general anesthesia. Pt will throw up and has thrown up during procedure.    Ether Nausea And Vomiting    Medications: I have reviewed the patient's current medications.  Results for orders placed or performed during the hospital encounter of 01/11/22 (from the past 48 hour(s))  Glucose, capillary     Status: Abnormal   Collection Time: 01/13/22  5:41 PM  Result Value Ref Range   Glucose-Capillary 260 (H) 70 - 99 mg/dL    Comment: Glucose reference range applies only to samples taken after fasting for at least 8 hours.  Glucose, capillary     Status: Abnormal   Collection Time: 01/13/22 10:13 PM  Result Value Ref Range   Glucose-Capillary 233 (H) 70 - 99 mg/dL    Comment: Glucose reference range applies only to samples taken after fasting for at least 8 hours.   Comment 1 Notify RN    Comment 2 Document in Chart   Glucose, capillary     Status: Abnormal   Collection Time: 01/14/22  6:42 AM  Result Value Ref Range   Glucose-Capillary 206 (H) 70 - 99 mg/dL    Comment: Glucose reference range applies only to samples taken after fasting for at least 8 hours.   Comment 1 Notify RN    Comment 2 Document in Chart   CBC     Status: None   Collection Time: 01/14/22  9:01 AM  Result Value Ref Range   WBC 7.3 4.0 - 10.5 K/uL   RBC 4.14 3.87 - 5.11 MIL/uL   Hemoglobin 12.6 12.0 - 15.0 g/dL   HCT 37.9 36.0 - 46.0 %   MCV  91.5 80.0 - 100.0 fL   MCH 30.4 26.0 - 34.0 pg   MCHC 33.2 30.0 - 36.0 g/dL   RDW 14.0 11.5 - 15.5 %   Platelets 176 150 - 400 K/uL    Comment: REPEATED TO VERIFY   nRBC 0.0 0.0 - 0.2 %    Comment: Performed at [facility], [place] [address], [place], [place] [address]  Basic metabolic panel     Status: Abnormal   Collection Time: 01/14/22  9:01 AM  Result Value Ref Range   Sodium 136 135 - 145 mmol/L   Potassium 4.4 3.5 - 5.1 mmol/L   Chloride  106 98 - 111 mmol/L   CO2 21 (L) 22 - 32 mmol/L   Glucose, Bld 166 (H) 70 - 99 mg/dL    Comment: Glucose reference range applies only to samples taken after fasting for at least 8 hours.   BUN 16 8 - 23 mg/dL   Creatinine, Ser 0.81 0.44 - 1.00 mg/dL   Calcium 9.2 8.9 - 10.3 mg/dL   GFR, Estimated >60 >60 mL/min    Comment: (NOTE) Calculated using the CKD-EPI Creatinine Equation (2021)    Anion gap 9 5 - 15    Comment: Performed at [facility] [address], [place], [place] [address]  Phosphorus     Status: Abnormal   Collection Time: 01/14/22  9:01 AM  Result Value Ref Range   Phosphorus 2.2 (L) 2.5 - 4.6 mg/dL    Comment: Performed at [facility] [address], [place], [place] [address]  Glucose, capillary     Status: Abnormal   Collection Time: 01/14/22 12:08 PM  Result Value Ref Range   Glucose-Capillary 273 (H) 70 - 99 mg/dL    Comment: Glucose reference range applies only to samples taken after fasting for at least 8 hours.   Comment 1 Notify RN    Comment 2 Document in Chart   Glucose, capillary     Status: Abnormal   Collection Time: 01/14/22  4:14 PM  Result Value Ref Range   Glucose-Capillary 319 (H) 70 - 99 mg/dL    Comment: Glucose reference range applies only to samples taken after fasting for at least 8 hours.   Comment 1 Notify RN    Comment 2 Document in Chart   Glucose, capillary     Status: Abnormal   Collection Time: 01/14/22  9:20 PM  Result Value Ref Range   Glucose-Capillary 302 (H)  70 - 99 mg/dL    Comment: Glucose reference range applies only to samples taken after fasting for at least 8 hours.  Glucose, capillary     Status: Abnormal   Collection Time: 01/15/22  6:36 AM  Result Value Ref Range   Glucose-Capillary 181 (H) 70 - 99 mg/dL    Comment: Glucose reference range applies only to samples taken after fasting for at least 8 hours.  Glucose, capillary     Status: Abnormal   Collection Time: 01/15/22 12:24 PM  Result Value Ref Range   Glucose-Capillary 230 (H) 70 - 99 mg/dL    Comment: Glucose reference range applies only to samples taken after fasting for at least 8 hours.   Comment 1 Notify RN    Comment 2 Document in Chart     No results found.  Review of Systems  Constitutional:  Positive for activity change.  Neurological:  Positive for weakness.  All other systems reviewed and are negative.  Blood pressure (!) 144/68, pulse 97, temperature (!) 97.4 F (36.3 C), temperature source Oral, resp. rate 19, weight 104.1 kg, SpO2 98 %. Physical Exam Constitutional:      Appearance: Normal appearance. She is normal weight.  HENT:     Head: Normocephalic and atraumatic.     Right Ear: Tympanic membrane normal.     Left Ear: Tympanic membrane normal.     Mouth/Throat:     Mouth: Mucous membranes are moist.     Pharynx: Oropharynx is clear.  Eyes:     Extraocular Movements: Extraocular movements intact.     Conjunctiva/sclera: Conjunctivae normal.     Pupils: Pupils are  equal, round, and reactive to light.  Musculoskeletal:        General: Normal range of motion.  Skin:    General: Skin is warm and dry.     Capillary Refill: Capillary refill takes less than 2 seconds.  Neurological:     Mental Status: She is alert.     Comments: Mild left upper extremity weakness compared to the right side with a moderate drift.  Speech appears to be intact.  Psychiatric:        Mood and Affect: Mood normal.        Behavior: Behavior normal.        Thought  Content: Thought content normal.        Judgment: Judgment normal.     Assessment/Plan: Patient with a large frontal meningioma with some vasogenic edema and subfalcine herniation with acute mental status change likely related to extension of pontine infarct and urinary tract infection.  At this time in light of the fact that she is improving substantially I believe that simply following her along would be the wisest choice.  Concern is that she is likely at high risk for further stroke centering her posterior vascular system and it may be wisest to allow her to recover from this acute event before considering any surgical intervention for her.  Blanchie Dessert Suly Vukelich 01/15/2022, 4:52 PM

## 2022-01-15 NOTE — Progress Notes (Signed)
PROGRESS NOTE    Cassandra Dodson  GUR:[id number] DOB: [birth year] DOA: 01/11/2022 PCP: Shon Baton, MD    Chief Complaint  Patient presents with   Altered Mental Status    Brief Narrative:   Cassandra Dodson is a 81 y.o. female with medical history significant for early stage uterine cancer, followed by gyn oncology, hypertension, type 2 diabetes, hypothyroidism, GERD, CKD 3B, chronic anxiety/depression, osteoarthritis, history of CVA and meningioma with left-sided deficit and speech difficulty, who presented to [facility] ED from home via EMS due to confusion for the past 2 days.   -Her work-up was significant for UTI, as well known meningioma 5 cm with local mass effect and edema, but seems to be stable from most recent imaging last July.  As well MRI brain showing acute ischemic event concerning for acute CVA.    Assessment & Plan:   Principal Problem:   AMS (altered mental status) Active Problems:   Endometrial cancer ([place])   Hypertension   Brain mass   CVA (cerebral vascular accident) ([place])   Stage 3b chronic kidney disease ([facility])   UTI (urinary tract infection)   Type 2 diabetes mellitus with stage 3b chronic kidney disease, without long-term current use of insulin (HCC)    Acute metabolic encephalopathy in the setting of UTI and meningioma with vasogenic edema, POA Possible acute CVA - CT head without contrast with stable meningioma measuring 5 cm and associated vasogenic edema and regional mass effect.  - Treat underlying conditions - Reorient as needed - Delirium precautions - Fall and aspiration precautions. -EEG  suggestive of non specific cortical dysfunction arising from bifrontal region. Additionally there is mild diffuse encephalopathy, nonspecific etiology. No seizures or epileptiform discharges were seen throughout the recording -As well MRI  significant for evaluation of her right paramidline pontine infarct, with new punctuate focus fusion abnormality and the previous  infarct, possible acute infarct involving in the previous infarct, is concerning for acute CVA, neurology is following.  5 cm meningioma with associated vasogenic edema and regional mass effect -This appears to be stable from most recent imaging last July, have discussed with Dr. Ellene Route who will evaluate today. -CVAT Decadron initially, she is currently off Decadron.  Severe sepsis secondary to UTI, POA UA positive for pyuria WBC 13,000, heart rate 108, respiration rate 26, lactic acid 5.0. Follow urine culture, blood cultures x2 peripherally for ID and sensitivities. Most recent urine culture was positive for Pseudomonas aeruginosa, sensitive to cefepime Continue cefepime X 5days current urine cultures unhelpful and growing multiple species.   High anion gap metabolic acidosis likely secondary to lactic acidosis Anion gap 16, serum bicarb 17, lactic acid 5.0 Continue IV fluid hydration,    Type 2 diabetes with hyperglycemia Last hemoglobin A1c 7.0 on 10/26/2021. Started on insulin sliding scale, remains elevated , so started on 10 units of Semglee   GERD Resume home PPI   Hyperlipidemia Resume home Crestor   CKD 3A At baseline with creatinine of 1.02 with GFR of 55. Avoid nephrotoxic agents, dehydration and hypotension. Monitor urine output with strict I's and O's  Obesity BMI 38 Recommend outpatient weight loss with regular physical activity and healthy diet.   History of CVA and left-sided deficits Resume home DAPT, fenofibrate, Crestor. PT OT to assess Fall precautions   Hypomagnesemia Hypokalemia Hypophosphatemia - Repleted,  DVT prophylaxis: Lovenox Code Status: Full Family Communication: Discussed with son by phone  9/24 Disposition:   Status is: Inpatient    Consultants:  Neurosurgery Dr.  Elsner to see in a.m. Neurology consulted   Subjective:  No significant events overnight, she denies any complaints today  Objective: Vitals:   01/14/22 2303  01/15/22 0335 01/15/22 0822 01/15/22 1219  BP: (!) 156/94 (!) 145/79 (!) 129/96 (!) 144/68  Pulse: 91 88 95 97  Resp: '18 18 17 19  '$ Temp: 98.3 F (36.8 C) 98.3 F (36.8 C) 98 F (36.7 C) (!) 97.4 F (36.3 C)  TempSrc:  Oral Oral Oral  SpO2: 98% 99% 100% 98%  Weight:        Intake/Output Summary (Last 24 hours) at 01/15/2022 1243 Last data filed at 01/15/2022 1219 Gross per 24 hour  Intake 337 ml  Output 3050 ml  Net -2713 ml   Filed Weights   01/11/22 2303  Weight: 104.1 kg    Examination:  Awake Alert, confused, frail, chronically ill-appearing Symmetrical Chest wall movement, Good air movement bilaterally, CTAB RRR,No Gallops,Rubs or new Murmurs, No Parasternal Heave +ve B.Sounds, Abd Soft, No tenderness, No rebound - guarding or rigidity. No Cyanosis, Clubbing or edema, No new Rash or bruise        Data Reviewed: I have personally reviewed following labs and imaging studies  CBC: Recent Labs  Lab 01/11/22 1940 01/12/22 0654 01/13/22 0757 01/14/22 0901  WBC 13.2* 7.0 7.5 7.3  NEUTROABS 11.1* 6.2  --   --   HGB 15.4* 13.2 12.2 12.6  HCT 46.8* 39.9 35.7* 37.9  MCV 92.1 91.7 90.8 91.5  PLT 221 165 147* 706    Basic Metabolic Panel: Recent Labs  Lab 01/11/22 2128 01/12/22 0654 01/13/22 0757 01/14/22 0901  NA 137 137 139 136  K 4.0 3.9 3.0* 4.4  CL 104 107 109 106  CO2 17* 20* 21* 21*  GLUCOSE 229* 254* 155* 166*  BUN '19 19 15 16  '$ CREATININE 1.02* 0.75 0.69 0.81  CALCIUM 10.2 9.5 8.7* 9.2  MG  --  1.6* 1.8  --   PHOS  --  2.4* 1.8* 2.2*    GFR: Estimated Creatinine Clearance: 64.1 mL/min (by C-G formula based on SCr of 0.81 mg/dL).  Liver Function Tests: Recent Labs  Lab 01/11/22 2128 01/12/22 0654  AST 21 14*  ALT 8 9  ALKPHOS 62 58  BILITOT 0.5 0.4  PROT 6.5 6.0*  ALBUMIN 3.3* 3.0*    CBG: Recent Labs  Lab 01/14/22 1208 01/14/22 1614 01/14/22 2120 01/15/22 0636 01/15/22 1224  GLUCAP 273* 319* 302* 181* 230*     Recent  Results (from the past 240 hour(s))  Urine Culture     Status: Abnormal   Collection Time: 01/11/22  6:52 PM   Specimen: In/Out Cath Urine  Result Value Ref Range Status   Specimen Description IN/OUT CATH URINE  Final   Special Requests   Final    NONE Performed at [facility], [place] [address] [place], [place] [address]    Culture MULTIPLE SPECIES PRESENT, SUGGEST RECOLLECTION (A)  Final   Report Status 01/13/2022 FINAL  Final  Culture, blood (Routine X 2) w Reflex to ID Panel     Status: None (Preliminary result)   Collection Time: 01/12/22  3:37 AM   Specimen: BLOOD LEFT HAND  Result Value Ref Range Status   Specimen Description BLOOD LEFT HAND  Final   Special Requests   Final    BOTTLES DRAWN AEROBIC AND ANAEROBIC Blood Culture adequate volume   Culture   Final    NO GROWTH 3 DAYS Performed at [facility], [place] [address], [place], [place] [address]    Report Status PENDING  Incomplete  Culture, blood (Routine X 2) w Reflex to ID Panel     Status: None (Preliminary result)   Collection Time: 01/12/22  3:38 AM   Specimen: BLOOD LEFT HAND  Result Value Ref Range Status   Specimen Description BLOOD LEFT HAND  Final   Special Requests   Final    BOTTLES DRAWN AEROBIC AND ANAEROBIC Blood Culture adequate volume   Culture   Final    NO GROWTH 3 DAYS Performed at [facility], [place] [address], [place], [place] [address]    Report Status PENDING  Incomplete         Radiology Studies: MR BRAIN W WO CONTRAST  Result Date: 01/13/2022 CLINICAL DATA:  Left-sided weakness. Meningioma. EXAM: MRI HEAD WITHOUT AND WITH CONTRAST TECHNIQUE: Multiplanar, multiecho pulse sequences of the brain and surrounding structures were obtained without and with intravenous contrast. CONTRAST:  10 mL Vueway COMPARISON:  MR head without contrast 11/15/2021. MR head without and with contrast 10/25/2021. FINDINGS: Brain: Planum sphenoidale meningioma is stable in size measuring 0.5 x  3.2 x 3.8 cm. Surrounding vasogenic edema is stable. Subfalcine midline shift again noted. Edema extends into the left external capsule and superiorly in the frontal lobe. Edema within the anterior genu of the corpus callosum is stable. Continued evolution of the right paramidline pontine infarct is noted. Remote posterior right cerebellar infarct is present. Remote lacunar infarct is present in the left thalamus. Periventricular white matter changes are stable. Diffusion-weighted images demonstrate a punctate focus of restricted diffusion within the anterior aspect of the right paramedian pontine infarct. Vascular: Abnormal signal is again noted within the left vertebral artery suggesting slower occluded flow. Flow is present in the right vertebral artery and basilar artery. Flow is present in the anterior circulation. Skull and upper cervical spine: The craniocervical junction is normal. Upper cervical spine is within normal limits. Marrow signal is unremarkable. Sinuses/Orbits: The paranasal sinuses and mastoid air cells are clear. The globes and orbits are within normal limits. IMPRESSION: 1. Continued evolution of the right paramidline pontine infarct. 2. Punctate focus of diffusion abnormality within the anterior aspect of the previous infarct. This may represent acute infarct of some residual tissue. It is largely encompassed within the evolving prior infarct. 3. Stable appearance of a sphenoidale meningioma with surrounding vasogenic edema and midline shift. 4. Remote posterior right cerebellar infarct. 5. Remote lacunar infarct of the left thalamus. 6. Stable white matter disease. Electronically Signed   By: San Morelle M.D.   On: 01/13/2022 16:55        Scheduled Meds:  amLODipine  10 mg Oral Daily   aspirin EC  81 mg Oral Daily   clopidogrel  75 mg Oral Daily   enoxaparin (LOVENOX) injection  50 mg Subcutaneous Q24H   fenofibrate  160 mg Oral QPC breakfast   folic acid  1 mg Oral  Daily   insulin aspart  0-5 Units Subcutaneous QHS   insulin aspart  0-9 Units Subcutaneous TID WC   pantoprazole  40 mg Oral Daily   rosuvastatin  20 mg Oral Daily   Continuous Infusions:  ceFEPime (MAXIPIME) IV 2 g (01/15/22 0944)     LOS: 4 days       Phillips Climes, MD [facility]   To contact the attending provider between 7A-7P or the covering provider during after hours 7P-7A, please log into the web  site [email or web address] and access using universal [place] password for that web site. If you do not have the password, please call the hospital operator.  01/15/2022, 12:43 PM

## 2022-01-15 NOTE — Progress Notes (Addendum)
STROKE TEAM PROGRESS NOTE   INTERVAL HISTORY Patient is seen in her room with no family at the bedside.  On Friday, she had abruptly altered mental status with left sided weakness and was found to have a UTI as well as a small pontine stroke near the site of her previous stroke from 7/23.  Patient has been treated for her UTI, and mental status is much improved.  She reports improvement in left sided weakness as well.  Vitals:   01/14/22 2303 01/15/22 0335 01/15/22 0822 01/15/22 1219  BP: (!) 156/94 (!) 145/79 (!) 129/96 (!) 144/68  Pulse: 91 88 95 97  Resp: '18 18 17 19  '$ Temp: 98.3 F (36.8 C) 98.3 F (36.8 C) 98 F (36.7 C) (!) 97.4 F (36.3 C)  TempSrc:  Oral Oral Oral  SpO2: 98% 99% 100% 98%  Weight:       CBC:  Recent Labs  Lab 01/11/22 1940 01/12/22 0654 01/13/22 0757 01/14/22 0901  WBC 13.2* 7.0 7.5 7.3  NEUTROABS 11.1* 6.2  --   --   HGB 15.4* 13.2 12.2 12.6  HCT 46.8* 39.9 35.7* 37.9  MCV 92.1 91.7 90.8 91.5  PLT 221 165 147* 017   Basic Metabolic Panel:  Recent Labs  Lab 01/12/22 0654 01/13/22 0757 01/14/22 0901  NA 137 139 136  K 3.9 3.0* 4.4  CL 107 109 106  CO2 20* 21* 21*  GLUCOSE 254* 155* 166*  BUN '19 15 16  '$ CREATININE 0.75 0.69 0.81  CALCIUM 9.5 8.7* 9.2  MG 1.6* 1.8  --   PHOS 2.4* 1.8* 2.2*   Lipid Panel: No results for input(s): "CHOL", "TRIG", "HDL", "CHOLHDL", "VLDL", "LDLCALC" in the last 168 hours. HgbA1c: No results for input(s): "HGBA1C" in the last 168 hours. Urine Drug Screen: No results for input(s): "LABOPIA", "COCAINSCRNUR", "LABBENZ", "AMPHETMU", "THCU", "LABBARB" in the last 168 hours.  Alcohol Level  Recent Labs  Lab 01/11/22 2047  ETH <10    IMAGING past 24 hours No results found.  PHYSICAL EXAM General:  Alert, well-nourished, well-developed elderly patient in no acute distress Respiratory:  Regular, unlabored respirations on room  air  NEURO:  Mental Status: AA&Ox2 Speech/Language: speech is without dysarthria or  aphasia.  Fluency, and comprehension intact.  Cranial Nerves:  II: PERRL. Visual fields full.  III, IV, VI: EOMI. Eyelids elevate symmetrically.  V: Sensation is intact to light touch and symmetrical to face.  VII: Smile is symmetrical.  VIII: hearing intact to voice. IX, X: Phonation is normal. . XII: tongue is midline without fasciculations. Motor: 5/5 strength to RUE and RLE, 3/5 to LUE and 2/5 to LLE  Tone: is normal and bulk is normal Sensation- Intact to light touch bilaterally.  Coordination: FTN intact bilaterally, drift in left arm Gait- deferred   ASSESSMENT/PLAN Ms. RAMLA HASE is a 81 y.o. 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 Patient was found to have a UTI as well as a small pontine stroke near the site of her previous stroke from 7/23.  Patient has been treated for her UTI, and mental status is much improved.  She reports improvement in left sided weakness as well.  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  Stable 5 cm meningioma. Small vessel disease. Atrophy.  MRI  punctate focus of restricted diffusion at anterior aspect of previous infarct MRA  pending Carotid Doppler  pending 2D Echo EF 60-65%, moderate LVH of basal-septal segment, interatrial septum not well visualized LDL 133 HgbA1c 7.0 VTE prophylaxis - lovenox    Diet   Diet heart healthy/carb modified Room service appropriate? Yes; Fluid consistency: Thin   clopidogrel 75 mg daily prior to admission, now on aspirin 81 mg daily and clopidogrel 75 mg daily.  Therapy recommendations:  pending Disposition:  pending  Hypertension Home meds:  amlodipine 10 mg daily Stable Permissive  hypertension (OK if < 220/120) but gradually normalize in 5-7 days Long-term BP goal normotensive  Hyperlipidemia Home meds:  rosuvastatin 20 mg daily, fenofibrate 160 mg daily resumed in hospital LDL pending, goal < 70 Continue statin at discharge  Diabetes type II Controlled Home meds:  metformin 1000 mg daily, insulin glargine 35 units daily, insulin lispro 1 unit TID before meals HgbA1c 7.0, goal < 7.0 CBGs Recent Labs    01/14/22 2120 01/15/22 0636 01/15/22 1224  GLUCAP 302* 181* 230*    SSI  Other Stroke Risk Factors Advanced Age >/= 1  Former cigarette smoker Obesity, Body mass index is 38.19 kg/m., BMI >/= 30 associated with increased stroke risk, recommend weight loss, diet and exercise as appropriate  Hx stroke   Other Active Problems Large frontal meningioma Stable from MRI 7/23  Hospital day # [place] , MSN, AGACNP-BC [facility] See Amion for schedule and pager information 01/15/2022 2:54 PM  I have personally obtained history,examined this patient, reviewed notes, independently viewed imaging studies, participated in medical decision making and plan of care.ROS completed by me personally and pertinent positives fully documented  I have made any additions or clarifications directly to the above note. Agree with note above.  Patient presented with confusion and increased left hemiparesis in the setting of UTI and seems to be improving.  MRI scan shows tiny  punctate infarct adjacent to her recent pontine infarct from symptomatic basilar stenosis for which she has undergone elective angioplasty stenting.  Recommend treatment of UTI as per primary team.  Continue aspirin and Plavix.  Check CT angiogram to look for stent patency.  Continue aggressive risk factor modification.  Physical occupational therapy consults.  Greater than 50% time during this 50-minute visit was spent on counseling and coordination of care about her pontine stroke  and basilar stenosis and angioplasty and discussion about stroke prevention and treatment and answering questions.  Antony Contras, MD Medical Director [place] Pager: [phone] 01/15/2022 4:48 PM   To contact Stroke Continuity provider, please refer to [email or web address] After hours, contact General Neurology

## 2022-01-15 NOTE — Progress Notes (Signed)
Mobility Specialist: Progress Note   01/15/22 1727  Mobility  Activity Stood at bedside  Level of Assistance +2 (takes two people)  Arts administrator  Activity Response Tolerated fair  $Mobility charge 1 Mobility   Pt received in the bed and agreeable to mobility. MaxA with bed mobility as well as to stand. Unable to fully stand in x2 attempts with +1. Pt assisted back to bed using +2 with RN. Pt has call bell and phone at her side.   [facility] Milarose Savich Mobility Specialist Mobility Specialist [address]: [phone]

## 2022-01-15 NOTE — Plan of Care (Signed)

## 2022-01-15 NOTE — Care Management Important Message (Signed)
Important Message  Patient Details  Name: Cassandra Dodson MRN: [id number] Date of Birth: January 10, 1941   Medicare Important Message Given:  Yes     Orbie Pyo 01/15/2022, 2:41 PM

## 2022-01-16 ENCOUNTER — Inpatient Hospital Stay (HOSPITAL_COMMUNITY): Payer: Medicare Other

## 2022-01-16 DIAGNOSIS — R4182 Altered mental status, unspecified: Secondary | ICD-10-CM | POA: Diagnosis not present

## 2022-01-16 LAB — GLUCOSE, CAPILLARY
Glucose-Capillary: 160 mg/dL — ABNORMAL HIGH (ref 70–99)
Glucose-Capillary: 164 mg/dL — ABNORMAL HIGH (ref 70–99)
Glucose-Capillary: 267 mg/dL — ABNORMAL HIGH (ref 70–99)
Glucose-Capillary: 246 mg/dL — ABNORMAL HIGH (ref 70–99)
Glucose-Capillary: 257 mg/dL — ABNORMAL HIGH (ref 70–99)
Glucose-Capillary: 212 mg/dL — ABNORMAL HIGH (ref 70–99)
Glucose-Capillary: 212 mg/dL — ABNORMAL HIGH (ref 70–99)

## 2022-01-16 LAB — LIPID PANEL
Cholesterol: 118 mg/dL (ref 0–200)
HDL: 48 mg/dL (ref >40)
LDL Cholesterol: 44 mg/dL (ref 0–99)
Total CHOL/HDL Ratio: 2.5 RATIO
Triglycerides: 128 mg/dL (ref <150)
VLDL: 26 mg/dL (ref 0–40)

## 2022-01-16 LAB — HEMOGLOBIN A1C
Hgb A1c MFr Bld: 6 % — ABNORMAL HIGH (ref 4.8–5.6)
Mean Plasma Glucose: 125.5 mg/dL

## 2022-01-16 MED ORDER — ASPIRIN 81 MG PO TBEC: 81.0000 mg | DELAYED_RELEASE_TABLET | Freq: Every day | ORAL | Status: DC

## 2022-01-16 MED ORDER — INSULIN GLARGINE-YFGN 100 UNIT/ML ~~LOC~~ SOLN
15.0000 [IU] | Freq: Every day | SUBCUTANEOUS | Status: DC
  Administered 2022-01-16 – 2022-01-17 (×2): 15 [IU] via SUBCUTANEOUS
  Filled 2022-01-16 (×2): qty 0.15

## 2022-01-16 MED ORDER — AMLODIPINE BESYLATE 5 MG PO TABS
5.0000 mg | ORAL_TABLET | Freq: Every day | ORAL | Status: DC
  Administered 2022-01-16 – 2022-01-17 (×2): 5 mg via ORAL
  Filled 2022-01-16 (×2): qty 1

## 2022-01-16 MED ORDER — IOHEXOL 350 MG/ML SOLN
100.0000 mL | Freq: Once | INTRAVENOUS | Status: AC | PRN
  Administered 2022-01-16: 75 mL via INTRAVENOUS

## 2022-01-16 NOTE — Progress Notes (Signed)
Physical Therapy Treatment Patient Details Name: Cassandra Dodson MRN: [id number] DOB: 1940/07/29 Today's Date: 01/16/2022   History of Present Illness YANNIS GUMBS is a 81 y.o. female admitted 01/11/22 with confusion. Her work-up was significant for UTI, as well known meningioma 5 cm with local mass effect and edema, but seems to be stable from most recent imaging last July PMH includes early stage uterine cancer, followed by gyn oncology, hypertension, type 2 diabetes, hypothyroidism, GERD, CKD 3B, chronic anxiety/depression, osteoarthritis, history of CVA and meningioma with left-sided deficit and speech difficulty.    PT Comments    Pt progressing slowly towards physical therapy goals. Was able to perform transfers with up to +2 total assist and the [facility]. Pt fatigued quickly and increased assist required at end of session to clean, change linen, and reposition. Pt with 2 bouts of incontinence while in the [facility] and with no awareness. Based on performance today, PT continues to recommend SNF level rehab. Per chart review, it appears the son may wish to take pt home at d/c with hired caregivers. If family declines SNF, recommend a hospital bed, hoyer lift, and constant assist/supervision to be available for safe mobility. Will continue to follow.     Recommendations for follow up therapy are one component of a multi-disciplinary discharge planning process, led by the attending physician.  Recommendations may be updated based on patient status, additional functional criteria and insurance authorization.  Follow Up Recommendations  Skilled nursing-short term rehab (<3 hours/day) Can patient physically be transported by private vehicle: No   Assistance Recommended at Discharge Frequent or constant Supervision/Assistance  Patient can return home with the following Two people to help with walking and/or transfers;Two people to help with bathing/dressing/bathroom;Assist for transportation;Assistance with  cooking/housework;Direct supervision/assist for medications management;Help with stairs or ramp for entrance   Equipment Recommendations  None recommended by PT    Recommendations for Other Services       Precautions / Restrictions Precautions Precautions: Fall Precaution Comments: incontinence Restrictions Weight Bearing Restrictions: No     Mobility  Bed Mobility Overal bed mobility: Needs Assistance Bed Mobility: Rolling, Sidelying to Sit, Sit to Supine Rolling: +2 for physical assistance, +2 for safety/equipment, Min assist Sidelying to sit: Mod assist, +2 for physical assistance, +2 for safety/equipment   Sit to supine: Total assist, +2 for physical assistance, +2 for safety/equipment   General bed mobility comments: pt with cueing for technique, assist for LB and trunk to sit EOB and scoot foward.  When fatigued required total assist +2 to return supine    Transfers Overall transfer level: Needs assistance Equipment used: Rolling walker (2 wheels), Ambulation equipment used Transfers: Sit to/from Stand Sit to Stand: Max assist, +2 safety/equipment, +2 physical assistance           General transfer comment: sit to stand from EOB using stedy, max assist +2 to power up and fully ascend.  Perched in steady heavy anterior lean and max cueing to maintain upright position. Transfer via Lift Equipment: Stedy  Ambulation/Gait               General Gait Details: Scientific laboratory technician Rankin (Stroke Patients Only)       Balance Overall balance assessment: Needs assistance Sitting-balance support: No upper extremity supported, Feet supported Sitting balance-Leahy Scale: Poor Sitting balance - Comments: initally min guard with unpredictable losses of balance posteriorly and  to the R requiring min to mod assist to correct toward midline. Postural control: Posterior lean, Right lateral lean Standing balance support:  Bilateral upper extremity supported, During functional activity Standing balance-Leahy Scale: Zero Standing balance comment: relies on external support, +2 assist required                            Cognition Arousal/Alertness: Awake/alert Behavior During Therapy: WFL for tasks assessed/performed Overall Cognitive Status: No family/caregiver present to determine baseline cognitive functioning Area of Impairment: Orientation, Attention, Following commands, Memory, Safety/judgement, Awareness, Problem solving                 Orientation Level:  (not test time (but oriented to place and situation)) Current Attention Level: Focused Memory: Decreased short-term memory, Decreased recall of precautions Following Commands: Follows one step commands inconsistently, Follows one step commands with increased time Safety/Judgement: Decreased awareness of safety, Decreased awareness of deficits Awareness: Intellectual Problem Solving: Slow processing, Decreased initiation, Requires verbal cues, Difficulty sequencing, Requires tactile cues General Comments: pt wet in urine upon entry with no awareness, incontinent of bladder 2x while in stedy and no awareness.  Initally follows simple commands with increased time but fades quickly when fatigued.        Exercises      General Comments        Pertinent Vitals/Pain Pain Assessment Pain Assessment: Faces Faces Pain Scale: Hurts little more Pain Location: knees with flexion Pain Descriptors / Indicators: Discomfort, Grimacing, Guarding Pain Intervention(s): Limited activity within patient's tolerance, Monitored during session, Repositioned    Home Living                          Prior Function            PT Goals (current goals can now be found in the care plan section) Acute Rehab PT Goals Patient Stated Goal: none stated PT Goal Formulation: Patient unable to participate in goal setting Time For Goal  Achievement: 01/26/22 Potential to Achieve Goals: Fair Progress towards PT goals: Progressing toward goals    Frequency    Min 3X/week      PT Plan Current plan remains appropriate    Co-evaluation PT/OT/SLP Co-Evaluation/Treatment: Yes Reason for Co-Treatment: Complexity of the patient's impairments (multi-system involvement);Necessary to address cognition/behavior during functional activity;For patient/therapist safety;To address functional/ADL transfers PT goals addressed during session: Mobility/safety with mobility;Balance;Proper use of DME OT goals addressed during session: ADL's and self-care      AM-PAC PT "6 Clicks" Mobility   Outcome Measure  Help needed turning from your back to your side while in a flat bed without using bedrails?: Total Help needed moving from lying on your back to sitting on the side of a flat bed without using bedrails?: Total Help needed moving to and from a bed to a chair (including a wheelchair)?: Total Help needed standing up from a chair using your arms (e.g., wheelchair or bedside chair)?: Total Help needed to walk in hospital room?: Total Help needed climbing 3-5 steps with a railing? : Total 6 Click Score: 6    End of Session Equipment Utilized During Treatment: Gait belt Activity Tolerance: Patient limited by fatigue Patient left: in bed;with call bell/phone within reach;with bed alarm set Nurse Communication: Mobility status;Need for lift equipment PT Visit Diagnosis: Other abnormalities of gait and mobility (R26.89);Other symptoms and signs involving the nervous system (R29.898)  Time: 8563-1497 PT Time Calculation (min) (ACUTE ONLY): 31 min  Charges:  $Therapeutic Activity: 8-22 mins                     Rolinda Roan, PT, DPT Acute Rehabilitation Services Secure Chat Preferred Office: [phone]    Thelma Comp 01/16/2022, 3:00 PM

## 2022-01-16 NOTE — Progress Notes (Signed)
STROKE TEAM PROGRESS NOTE   INTERVAL HISTORY Patient is seen in her room with no family at the bedside.  Her confusion appears improving and she is more interactive and follows commands well.  And left sided weakness persists but appears to be improving.  Vital signs stable.  Discussed with Dr. Ellene Route neurosurgeon who agrees not to do meningioma surgery at the present time patient go to rehab and improve first  Vitals:   01/16/22 0027 01/16/22 0328 01/16/22 0828 01/16/22 1210  BP: 137/81 116/67 (!) 153/94 (!) 141/79  Pulse: 72 84 93 85  Resp: '18 17 20 20  '$ Temp: 97.8 F (36.6 C) 98.4 F (36.9 C) 97.9 F (36.6 C) 97.8 F (36.6 C)  TempSrc:   Oral Oral  SpO2: 100% 98% 100% 100%  Weight:       CBC:  Recent Labs  Lab 01/11/22 1940 01/12/22 0654 01/13/22 0757 01/14/22 0901  WBC 13.2* 7.0 7.5 7.3  NEUTROABS 11.1* 6.2  --   --   HGB 15.4* 13.2 12.2 12.6  HCT 46.8* 39.9 35.7* 37.9  MCV 92.1 91.7 90.8 91.5  PLT 221 165 147* 707   Basic Metabolic Panel:  Recent Labs  Lab 01/12/22 0654 01/13/22 0757 01/14/22 0901  NA 137 139 136  K 3.9 3.0* 4.4  CL 107 109 106  CO2 20* 21* 21*  GLUCOSE 254* 155* 166*  BUN '19 15 16  '$ CREATININE 0.75 0.69 0.81  CALCIUM 9.5 8.7* 9.2  MG 1.6* 1.8  --   PHOS 2.4* 1.8* 2.2*   Lipid Panel:  Recent Labs  Lab 01/16/22 0852  CHOL 118  TRIG 128  HDL 48  CHOLHDL 2.5  VLDL 26  LDLCALC 44   HgbA1c:  Recent Labs  Lab 01/16/22 0852  HGBA1C 6.0*   Urine Drug Screen: No results for input(s): "LABOPIA", "COCAINSCRNUR", "LABBENZ", "AMPHETMU", "THCU", "LABBARB" in the last 168 hours.  Alcohol Level  Recent Labs  Lab 01/11/22 2047  ETH <10    IMAGING past 24 hours CT ANGIO HEAD W OR WO CONTRAST  Result Date: 01/16/2022 CLINICAL DATA:  Stroke follow-up EXAM: CT ANGIOGRAPHY HEAD TECHNIQUE: Multidetector CT imaging of the head was performed using the standard protocol during bolus administration of intravenous contrast. Multiplanar CT image  reconstructions and MIPs were obtained to evaluate the vascular anatomy. RADIATION DOSE REDUCTION: This exam was performed according to the departmental dose-optimization program which includes automated exposure control, adjustment of the mA and/or kV according to patient size and/or use of iterative reconstruction technique. CONTRAST:  19m OMNIPAQUE IOHEXOL 350 MG/ML SOLN COMPARISON:  Brain CT 10/25/2021, MRI brain 01/13/2022 and 11/15/2021 FINDINGS: CT HEAD Brain: Redemonstrated is large planum sphenoidale meningioma measuring 4.3 x 3.3 x 4.2 cm with surrounding vasogenic edema. There is associated mass effect with a proximally 1.8 cm rightward midline shift of the medial left frontal lobe cortex. There is sequela of moderate chronic microvascular disease with a chronic infarct in the left corona radiata (series 7, image 20), a chronic infarct in the right cerebellum (series 7, image 7), and also an evolving acute infarct in the right pons (series 7, image 12). Vascular: Redemonstrated basilar artery stent, new compared to 10/25/2021. There is a large vessel that traverses the center of this mass (series 11, image 81). The arterial vasculature likely arises from the ethmoidal arteries. Skull: Normal. Negative for fracture or focal lesion. Sinuses: No acute or significant finding. Other: None. CTA HEAD Anterior circulation: ACA-there is mild focal stenosis in the  proximal right A1 segment. Bilateral A2 segments are displaced to the right secondary to mass effect from the large planum sphenoidale meningioma. Both are patent. MCA- Carotids-mild focal stenosis of the cavernous segment of the right ICA on the right (series 12, image 86). Posterior circulation: PCA-there is a focal area of moderate to high grade narrowing at the P1 P2 junction on the right (series 11, image 99). The P2 segment of the left PCA predominantly receives its blood supply from the left PCOM (compatible with a fetal type left PCA).  Basilar-Redemonstrated stent within the basilar artery. The patency of the stent is difficult to assess. There is mild narrowing of the basilar artery along the proximal aspect of the stent. Vertebral-The left vertebral artery demonstrates multifocal areas of narrowing and is likely focally occluded in the distal V4 segment (series 11, image 131), new compared to 10/26/2021. Venous sinuses: Patency of the venous sinuses is incompletely assessed due to poor opacification. Anatomic variants: Fetal type PCA on the left. Review of the MIP images confirms the above findings. IMPRESSION: 1. Redemonstrated large planum sphenoidale meningioma with surrounding vasogenic edema and mass effect. Bilateral A2 segments are rightwardly displaced, but patent. This mass demonstrates central vascularity, which likely arises from the ethmoidal arteries. 2. Multifocal areas of narrowing in the left vertebral artery, which is likely focally occluded in the distal V4 segment, new compared to 10/26/2021. 3. Redemonstrated basilar artery stent, new compared to 10/25/2021. The patency of the stent is difficult to assess. There is mild narrowing of the basilar artery along the proximal aspect of the stent. Electronically Signed   By: Marin Roberts M.D.   On: 01/16/2022 09:08    PHYSICAL EXAM General:  Alert, well-nourished, well-developed elderly patient in no acute distress Respiratory:  Regular, unlabored respirations on room  air  NEURO:  Mental Status: AA&Ox2 Speech/Language: speech is without dysarthria or aphasia.  Fluency, and comprehension intact.  Diminished attention, registration and recall  Cranial Nerves:  II: PERRL. Visual fields full.  III, IV, VI: EOMI. Eyelids elevate symmetrically.  V: Sensation is intact to light touch and symmetrical to face.  VII: Smile is symmetrical.  VIII: hearing intact to voice. IX, X: Phonation is normal. . XII: tongue is midline without fasciculations. Motor: 5/5 strength to RUE  and RLE, 3/5 to LUE and 2-3/5 to LLE  Tone: is normal and bulk is normal Sensation- Intact to light touch bilaterally.  Coordination: FTN intact bilaterally, drift in left arm Gait- deferred   ASSESSMENT/PLAN Cassandra Dodson is a 81 y.o. 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 Patient was found to have a UTI as well as a small pontine stroke near the site of her previous stroke from 7/23.  Patient has been treated for her UTI, and mental status is much improved.  She reports improvement in left sided weakness as well.  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 Stable 5 cm meningioma. Small vessel disease. Atrophy.  MRI  punctate focus of restricted diffusion at anterior aspect of previous infarct CT angio shows multifocal areas of narrowing of the left vertebral artery which is  likely occluded and distal V4.    basilar artery stent with mild narrowing along the proximal aspect of the stent 2D Echo EF 60-65%, moderate LVH of basal-septal segment, interatrial septum not well visualized LDL 133 HgbA1c 7.0 VTE prophylaxis - lovenox    Diet   Diet heart healthy/carb modified Room service appropriate? Yes; Fluid consistency: Thin   clopidogrel 75 mg daily prior to admission, now on aspirin 81 mg daily and clopidogrel 75 mg daily. X 3 weeks and then Plavix alone Therapy recommendations:  pending Disposition:  pending  Hypertension Home meds:  amlodipine 10 mg daily Stable Permissive hypertension (OK if < 220/120) but gradually normalize in 5-7 days Long-term BP goal  normotensive  Hyperlipidemia Home meds:  rosuvastatin 20 mg daily, fenofibrate 160 mg daily resumed in hospital LDL pending, goal < 70 Continue statin at discharge  Diabetes type II Controlled Home meds:  metformin 1000 mg daily, insulin glargine 35 units daily, insulin lispro 1 unit TID before meals HgbA1c 7.0, goal < 7.0 CBGs Recent Labs    01/16/22 0606 01/16/22 0833 01/16/22 1147  GLUCAP 160* 164* 267*    SSI  Other Stroke Risk Factors Advanced Age >/= 34  Former cigarette smoker Obesity, Body mass index is 38.19 kg/m., BMI >/= 30 associated with increased stroke risk, recommend weight loss, diet and exercise as appropriate  Hx stroke   Other Active Problems Large frontal meningioma Stable from MRI 7/23  Hospital day # 5   Patient presented with confusion and increased left hemiparesis in the setting of UTI and seems to be improving.  MRI scan shows tiny  punctate infarct adjacent to her recent pontine infarct from symptomatic basilar stenosis for which she has undergone elective angioplasty stenting.  Recommend treatment of UTI as per primary team.  Continue aspirin and Plavix x 3 weeks and then Plavix alone.  .  Continue aggressive risk factor modification.  Physical occupational therapy consults.  Continue conservative follow-up for her large meningioma and cytotoxic edema for now and follow-up with neurosurgery after finishing rehab stay to discuss surgical treatment options.  Consider steroids for vasogenic cerebral edema related to meningioma if neurosurgery agreeable.  Discussed with Dr. Ellene Route neurosurgeon.  Greater than 50% time during this 35-minute visit was spent on counseling and coordination of care about her pontine stroke and basilar stenosis and angioplasty and discussion about stroke prevention and treatment and answering questions.  Stroke team will sign off.  Kindly call for questions  Antony Contras, MD Medical Director [place] Pager:  [phone] 01/16/2022 2:35 PM   To contact Stroke Continuity provider, please refer to [email or web address] After hours, contact General Neurology

## 2022-01-16 NOTE — Progress Notes (Signed)
PROGRESS NOTE    Cassandra Dodson  XWR:[id number] DOB: 04/11/1941 DOA: 01/11/2022 PCP: Shon Baton, MD    Chief Complaint  Patient presents with   Altered Mental Status    Brief Narrative:   Cassandra Dodson is a 81 y.o. 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   -Her work-up was significant for UTI, as well known meningioma 5 cm with local mass effect and edema, but seems to be stable from most recent imaging last July.  As well MRI brain showing acute ischemic event concerning for acute CVA.  Assessment & Plan:   Principal Problem:   AMS (altered mental status) Active Problems:   Endometrial cancer ([place])   Hypertension   Brain mass   CVA (cerebral vascular accident) ([place])   Stage 3b chronic kidney disease ([place])   UTI (urinary tract infection)   Type 2 diabetes mellitus with stage 3b chronic kidney disease, without long-term current use of insulin (HCC)    Acute metabolic encephalopathy in the setting of UTI and meningioma with vasogenic edema, POA Possible acute CVA - CT head without contrast with stable meningioma measuring 5 cm and associated vasogenic edema and regional mass effect.  - Treat underlying conditions - Reorient as needed - Delirium precautions - Fall and aspiration precautions. -EEG  suggestive of non specific cortical dysfunction arising from bifrontal region. Additionally there is mild diffuse encephalopathy, nonspecific etiology. No seizures or epileptiform discharges were seen throughout the recording -As well MRI  significant for evaluation of her right paramidline pontine infarct, with new punctuate focus fusion abnormality and the previous infarct,  possible acute infarct involving in the previous infarct, is concerning for acute CVA, neurology is following.  Patient is alert and oriented to self only.  5 cm meningioma with associated vasogenic edema and regional mass effect -This appears to be stable from most recent imaging last July, she was seen by neurosurgery, Dr. Ellene Route on 01/15/2022 who did not recommend any intervention at the moment and recommended that she recovers from her current stroke and then he will do further work-up as outpatient. -CVAT Decadron initially, she is currently off Decadron.  Severe sepsis secondary to UTI, POA UA positive for pyuria WBC 13,000, heart rate 108, respiration rate 26, lactic acid 5.0. Follow urine culture, blood cultures x2 peripherally for ID and sensitivities. Most recent urine culture was positive for Pseudomonas aeruginosa, sensitive to cefepime Continue cefepime X 5days current urine cultures unhelpful and growing multiple species.   High anion gap metabolic acidosis likely secondary to lactic acidosis Resolved.   Type 2 diabetes with hyperglycemia Last hemoglobin A1c 7.0 on 10/26/2021. Started on insulin sliding scale, remains elevated , so started on 10 units of Semglee, still elevated, increase Semglee to 15 units and continue SSI.   GERD Resume home PPI   Hyperlipidemia Resumed home Crestor   CKD 3A At baseline with creatinine of 1.02 with GFR of 55. Avoid nephrotoxic agents, dehydration and hypotension. Monitor urine output with strict I's and O's  Obesity BMI 38 Recommend outpatient weight loss with regular physical activity and healthy diet.   History of CVA and left-sided deficits Resumed home DAPT, fenofibrate, Crestor. PT OT to assess Fall precautions   Hypomagnesemia Hypokalemia Hypophosphatemia  Resolved.  DVT prophylaxis: Lovenox Code Status: Full Family Communication: Discussed with son by phone today. Disposition:   Status is: Inpatient     Consultants:  Neurosurgery Dr. Ellene Route to see in a.m. Neurology consulted   Subjective:  Patient seen and examined.  Family not at the bedside.  Patient was alert and oriented to self but she thought that she was at [facility] long.  I reminded her that she is at [facility].  She was unable to tell me about the month and the year or the president.  I discussed with the son over the phone.  He has not seen his mother yet and will not be able to do so until late in the afternoon.  He said he will come later and let us know if she is at her baseline and will let us know if he is willing to take her home tomorrow as he is refusing SNF.  Objective: Vitals:   01/15/22 2019 01/16/22 0027 01/16/22 0328 01/16/22 0828  BP: 115/64 137/81 116/67 (!) 153/94  Pulse: 81 72 84 93  Resp: '18 18 17 20  '$ Temp: 98.2 F (36.8 C) 97.8 F (36.6 C) 98.4 F (36.9 C) 97.9 F (36.6 C)  TempSrc: Oral   Oral  SpO2: 97% 100% 98% 100%  Weight:        Intake/Output Summary (Last 24 hours) at 01/16/2022 1133 Last data filed at 01/16/2022 0800 Gross per 24 hour  Intake 237 ml  Output 200 ml  Net 37 ml    Filed Weights   01/11/22 2303  Weight: 104.1 kg    Examination:  General exam: Appears calm and comfortable  Respiratory system: Clear to auscultation. Respiratory effort normal. Cardiovascular system: S1 & S2 heard, RRR. No JVD, murmurs, rubs, gallops or clicks. No pedal edema. Gastrointestinal system: Abdomen is nondistended, soft and nontender. No organomegaly or masses felt. Normal bowel sounds heard. Central nervous system: Alert and orientedx1.  5/5 strength in right upper extremity and right lower extremity, 3/5 power in left upper extremity and 2/5 power in left lower extremity.  Data Reviewed: I have personally reviewed following labs and imaging studies  CBC: Recent Labs  Lab 01/11/22 1940 01/12/22 0654 01/13/22 0757 01/14/22 0901  WBC 13.2* 7.0 7.5 7.3  NEUTROABS 11.1* 6.2  --   --   HGB  15.4* 13.2 12.2 12.6  HCT 46.8* 39.9 35.7* 37.9  MCV 92.1 91.7 90.8 91.5  PLT 221 165 147* 176     Basic Metabolic Panel: Recent Labs  Lab 01/11/22 2128 01/12/22 0654 01/13/22 0757 01/14/22 0901  NA 137 137 139 136  K 4.0 3.9 3.0* 4.4  CL 104 107 109 106  CO2 17* 20* 21* 21*  GLUCOSE 229* 254* 155* 166*  BUN '19 19 15 16  '$ CREATININE 1.02* 0.75 0.69 0.81  CALCIUM 10.2 9.5 8.7* 9.2  MG  --  1.6* 1.8  --   PHOS  --  2.4* 1.8* 2.2*     GFR: Estimated Creatinine Clearance: 64.1 mL/min (by C-G formula based on SCr of 0.81 mg/dL).  Liver Function Tests: Recent Labs  Lab 01/11/22 2128 01/12/22 0654  AST 21 14*  ALT 8 9  ALKPHOS 62 58  BILITOT 0.5 0.4  PROT 6.5 6.0*  ALBUMIN 3.3* 3.0*     CBG: Recent Labs  Lab 01/15/22 1224 01/15/22 1736 01/15/22 2128 01/16/22 0606 01/16/22 0833  GLUCAP 230* 167* 256* 160* 164*      Recent Results (from the  past 240 hour(s))  Urine Culture     Status: Abnormal   Collection Time: 01/11/22  6:52 PM   Specimen: In/Out Cath Urine  Result Value Ref Range Status   Specimen Description IN/OUT CATH URINE  Final   Special Requests   Final    NONE Performed at [facility], [address], [place], [place] [address]    Culture MULTIPLE SPECIES PRESENT, SUGGEST RECOLLECTION (A)  Final   Report Status 01/13/2022 FINAL  Final  Culture, blood (Routine X 2) w Reflex to ID Panel     Status: None (Preliminary result)   Collection Time: 01/12/22  3:37 AM   Specimen: BLOOD LEFT HAND  Result Value Ref Range Status   Specimen Description BLOOD LEFT HAND  Final   Special Requests   Final    BOTTLES DRAWN AEROBIC AND ANAEROBIC Blood Culture adequate volume   Culture   Final    NO GROWTH 4 DAYS Performed at [facility], [place] [address] [place], [place] [address]    Report Status PENDING  Incomplete  Culture, blood (Routine X 2) w Reflex to ID Panel     Status: None (Preliminary result)   Collection Time: 01/12/22  3:38 AM    Specimen: BLOOD LEFT HAND  Result Value Ref Range Status   Specimen Description BLOOD LEFT HAND  Final   Special Requests   Final    BOTTLES DRAWN AEROBIC AND ANAEROBIC Blood Culture adequate volume   Culture   Final    NO GROWTH 4 DAYS Performed at [facility], [place] [address], [place], [place] [address]    Report Status PENDING  Incomplete         Radiology Studies: CT ANGIO HEAD W OR WO CONTRAST  Result Date: 01/16/2022 CLINICAL DATA:  Stroke follow-up EXAM: CT ANGIOGRAPHY HEAD TECHNIQUE: Multidetector CT imaging of the head was performed using the standard protocol during bolus administration of intravenous contrast. Multiplanar CT image reconstructions and MIPs were obtained to evaluate the vascular anatomy. RADIATION DOSE REDUCTION: This exam was performed according to the departmental dose-optimization program which includes automated exposure control, adjustment of the mA and/or kV according to patient size and/or use of iterative reconstruction technique. CONTRAST:  74m OMNIPAQUE IOHEXOL 350 MG/ML SOLN COMPARISON:  Brain CT 10/25/2021, MRI brain 01/13/2022 and 11/15/2021 FINDINGS: CT HEAD Brain: Redemonstrated is large planum sphenoidale meningioma measuring 4.3 x 3.3 x 4.2 cm with surrounding vasogenic edema. There is associated mass effect with a proximally 1.8 cm rightward midline shift of the medial left frontal lobe cortex. There is sequela of moderate chronic microvascular disease with a chronic infarct in the left corona radiata (series 7, image 20), a chronic infarct in the right cerebellum (series 7, image 7), and also an evolving acute infarct in the right pons (series 7, image 12). Vascular: Redemonstrated basilar artery stent, new compared to 10/25/2021. There is a large vessel that traverses the center of this mass (series 11, image 81). The arterial vasculature likely arises from the ethmoidal arteries. Skull: Normal. Negative for fracture or focal lesion. Sinuses:  No acute or significant finding. Other: None. CTA HEAD Anterior circulation: ACA-there is mild focal stenosis in the proximal right A1 segment. Bilateral A2 segments are displaced to the right secondary to mass effect from the large planum sphenoidale meningioma. Both are patent. MCA- Carotids-mild focal stenosis of the cavernous segment of the right ICA on the right (series 12, image 86). Posterior circulation: PCA-there is a  focal area of moderate to high grade narrowing at the P1 P2 junction on the right (series 11, image 99). The P2 segment of the left PCA predominantly receives its blood supply from the left PCOM (compatible with a fetal type left PCA). Basilar-Redemonstrated stent within the basilar artery. The patency of the stent is difficult to assess. There is mild narrowing of the basilar artery along the proximal aspect of the stent. Vertebral-The left vertebral artery demonstrates multifocal areas of narrowing and is likely focally occluded in the distal V4 segment (series 11, image 131), new compared to 10/26/2021. Venous sinuses: Patency of the venous sinuses is incompletely assessed due to poor opacification. Anatomic variants: Fetal type PCA on the left. Review of the MIP images confirms the above findings. IMPRESSION: 1. Redemonstrated large planum sphenoidale meningioma with surrounding vasogenic edema and mass effect. Bilateral A2 segments are rightwardly displaced, but patent. This mass demonstrates central vascularity, which likely arises from the ethmoidal arteries. 2. Multifocal areas of narrowing in the left vertebral artery, which is likely focally occluded in the distal V4 segment, new compared to 10/26/2021. 3. Redemonstrated basilar artery stent, new compared to 10/25/2021. The patency of the stent is difficult to assess. There is mild narrowing of the basilar artery along the proximal aspect of the stent. Electronically Signed   By: Marin Roberts M.D.   On: 01/16/2022 09:08      Scheduled Meds:  amLODipine  5 mg Oral Daily   aspirin EC  81 mg Oral Daily   clopidogrel  75 mg Oral Daily   enoxaparin (LOVENOX) injection  50 mg Subcutaneous Q24H   fenofibrate  160 mg Oral QPC breakfast   folic acid  1 mg Oral Daily   insulin aspart  0-5 Units Subcutaneous QHS   insulin aspart  0-9 Units Subcutaneous TID WC   insulin glargine-yfgn  15 Units Subcutaneous Daily   pantoprazole  40 mg Oral Daily   rosuvastatin  20 mg Oral Daily   Continuous Infusions:  ceFEPime (MAXIPIME) IV 2 g (01/16/22 1102)     LOS: 5 days   Darliss Cheney, MD [facility]  To contact the attending provider between 7A-7P or the covering provider during after hours 7P-7A, please log into the web site [email or web address] and access using universal [place] password for that web site. If you do not have the password, please call the hospital operator.  01/16/2022, 11:33 AM

## 2022-01-16 NOTE — Progress Notes (Signed)
Occupational Therapy Treatment Patient Details Name: Cassandra Dodson MRN: [id number] DOB: [birth year] Today's Date: 01/16/2022   History of present illness Cassandra Dodson is a 81 y.o. female admitted 01/11/22 with confusion. Her work-up was significant for UTI, as well known meningioma 5 cm with local mass effect and edema, but seems to be stable from most recent imaging last July PMH includes early stage uterine cancer, followed by gyn oncology, hypertension, type 2 diabetes, hypothyroidism, GERD, CKD 3B, chronic anxiety/depression, osteoarthritis, history of CVA and meningioma with left-sided deficit and speech difficulty.   OT comments  Pt supine in bed and agreeable to OT/PT session.  Pt oriented today, following simple commands with increased time initially but fades quickly with fatigue. She completes bed mobility with mod assist + 2 but demonstrates no awareness to incontinence of bladder in bed.  +2 max assist to stand in stedy with 2 episodes of incontinence while attempting to change brief.  Maximal fatigue with standing x 2, pt pushing heavily forward and requires support to maintain upright position.  Total assist +2 to return supine.  Highly recommend SNF, but if she dc's home will need DME below with maximal HH services.  Will follow.    Recommendations for follow up therapy are one component of a multi-disciplinary discharge planning process, led by the attending physician.  Recommendations may be updated based on patient status, additional functional criteria and insurance authorization.    Follow Up Recommendations  Skilled nursing-short term rehab (<3 hours/day)    Assistance Recommended at Discharge Frequent or constant Supervision/Assistance  Patient can return home with the following  Two people to help with walking and/or transfers;A lot of help with bathing/dressing/bathroom;Assistance with feeding;Direct supervision/assist for medications management;Direct supervision/assist for  financial management;Assist for transportation;Help with stairs or ramp for entrance   Equipment Recommendations  Hospital bed;Other (comment) (hoyer lift)    Recommendations for Other Services      Precautions / Restrictions Precautions Precautions: Fall Precaution Comments: incontinence Restrictions Weight Bearing Restrictions: No       Mobility Bed Mobility Overal bed mobility: Needs Assistance Bed Mobility: Rolling, Sidelying to Sit, Sit to Supine Rolling: +2 for physical assistance, +2 for safety/equipment, Min assist Sidelying to sit: Mod assist, +2 for physical assistance, +2 for safety/equipment   Sit to supine: Total assist, +2 for physical assistance, +2 for safety/equipment   General bed mobility comments: pt with cueing for technique, assist for LB and trunk to sit EOB and scoot foward.  When fatigued required total assist +2 to return supine    Transfers Overall transfer level: Needs assistance   Transfers: Sit to/from Stand Sit to Stand: Max assist, +2 safety/equipment, +2 physical assistance           General transfer comment: sit to stand from EOB using stedy, max assist +2 to power up and fully ascend.  Perched in steady heavy anterior lean and max cueing to maintain upright position. Transfer via Lift Equipment: Stedy   Balance Overall balance assessment: Needs assistance Sitting-balance support: No upper extremity supported, Feet supported Sitting balance-Leahy Scale: Poor Sitting balance - Comments: initally min guard with unpredictable losses of balance posteriorly and to the R requiring min to mod assist to correct toward midline. Postural control: Posterior lean, Right lateral lean Standing balance support: Bilateral upper extremity supported, During functional activity Standing balance-Leahy Scale: Zero Standing balance comment: relies on external support, poor  ADL either performed or assessed with  clinical judgement   ADL Overall ADL's : Needs assistance/impaired                 Upper Body Dressing : Maximal assistance;Bed level   Lower Body Dressing: Total assistance;+2 for physical assistance;+2 for safety/equipment;Sit to/from Health and safety inspector Details (indicate cue type and reason): deferred Toileting- Clothing Manipulation and Hygiene: Total assistance;+2 for physical assistance;+2 for safety/equipment;Sit to/from stand;Bed level Toileting - Clothing Manipulation Details (indicate cue type and reason): incontinent of bladder in stedy, total assist +2            Extremity/Trunk Assessment              Vision       Perception     Praxis      Cognition Arousal/Alertness: Awake/alert Behavior During Therapy: WFL for tasks assessed/performed Overall Cognitive Status: No family/caregiver present to determine baseline cognitive functioning Area of Impairment: Orientation, Attention, Following commands, Memory, Safety/judgement, Awareness, Problem solving                 Orientation Level:  (not test time (but oriented to place and situation)) Current Attention Level: Focused Memory: Decreased short-term memory, Decreased recall of precautions Following Commands: Follows one step commands inconsistently, Follows one step commands with increased time Safety/Judgement: Decreased awareness of safety, Decreased awareness of deficits Awareness: Intellectual Problem Solving: Slow processing, Decreased initiation, Requires verbal cues, Difficulty sequencing, Requires tactile cues General Comments: pt wet in urine upon entry with no awareness, incontinent of bladder 2x while in stedy and no awareness.  Initally follows simple commands with increased time but fades quickly when fatigued.        Exercises      Shoulder Instructions       General Comments      Pertinent Vitals/ Pain       Pain Assessment Pain Assessment: Faces Faces Pain  Scale: Hurts little more Pain Location: knees with flexion Pain Descriptors / Indicators: Discomfort, Aching Pain Intervention(s): Limited activity within patient's tolerance, Monitored during session, Repositioned  Home Living                                          Prior Functioning/Environment              Frequency  Min 2X/week        Progress Toward Goals  OT Goals(current goals can now be found in the care plan section)  Progress towards OT goals: Progressing toward goals  Acute Rehab OT Goals Patient Stated Goal: none stated Time For Goal Achievement: 01/26/22 Potential to Achieve Goals: [place] Discharge plan remains appropriate;Frequency remains appropriate    Co-evaluation    PT/OT/SLP Co-Evaluation/Treatment: Yes Reason for Co-Treatment: Complexity of the patient's impairments (multi-system involvement)   OT goals addressed during session: ADL's and self-care      AM-PAC OT "6 Clicks" Daily Activity     Outcome Measure   Help from another person eating meals?: A Little Help from another person taking care of personal grooming?: A Lot Help from another person toileting, which includes using toliet, bedpan, or urinal?: Total Help from another person bathing (including washing, rinsing, drying)?: A Lot Help from another person to put on and taking off regular upper body clothing?: A Lot Help from another person to put on and taking  off regular lower body clothing?: Total 6 Click Score: 11    End of Session Equipment Utilized During Treatment: Gait belt;Other (comment) (stedy)  OT Visit Diagnosis: Unsteadiness on feet (R26.81);Other abnormalities of gait and mobility (R26.89);Muscle weakness (generalized) (M62.81);Other symptoms and signs involving the nervous system (R29.898);Other symptoms and signs involving cognitive function   Activity Tolerance Patient tolerated treatment well   Patient Left in bed;with call bell/phone  within reach;with bed alarm set   Nurse Communication Mobility status;Precautions;Other (comment) (incontience)        Time: 8118-8677 OT Time Calculation (min): 31 min  Charges: OT General Charges $OT Visit: 1 Visit OT Treatments $Self Care/Home Management : 8-22 mins  [place] Office [phone]   Delight Stare 01/16/2022, 2:00 PM

## 2022-01-16 NOTE — Plan of Care (Signed)

## 2022-01-17 DIAGNOSIS — R4182 Altered mental status, unspecified: Secondary | ICD-10-CM | POA: Diagnosis not present

## 2022-01-17 LAB — BASIC METABOLIC PANEL
Anion gap: 9 (ref 5–15)
BUN: 32 mg/dL — ABNORMAL HIGH (ref 8–23)
CO2: 23 mmol/L (ref 22–32)
Calcium: 10.1 mg/dL (ref 8.9–10.3)
Chloride: 100 mmol/L (ref 98–111)
Creatinine, Ser: 0.85 mg/dL (ref 0.44–1.00)
GFR, Estimated: >60 mL/min (ref >60)
Glucose, Bld: 194 mg/dL — ABNORMAL HIGH (ref 70–99)
Potassium: 4.2 mmol/L (ref 3.5–5.1)
Sodium: 132 mmol/L — ABNORMAL LOW (ref 135–145)

## 2022-01-17 LAB — GLUCOSE, CAPILLARY
Glucose-Capillary: 171 mg/dL — ABNORMAL HIGH (ref 70–99)
Glucose-Capillary: 294 mg/dL — ABNORMAL HIGH (ref 70–99)

## 2022-01-17 LAB — CULTURE, BLOOD (ROUTINE X 2)
Culture: NO GROWTH
Culture: NO GROWTH
Special Requests: ADEQUATE
Special Requests: ADEQUATE

## 2022-01-17 LAB — MAGNESIUM: Magnesium: 1.7 mg/dL (ref 1.7–2.4)

## 2022-01-17 MED ORDER — ASPIRIN 81 MG PO TBEC: 81.0000 mg | DELAYED_RELEASE_TABLET | Freq: Every day | ORAL | 0 refills | Status: DC

## 2022-01-17 MED ORDER — INSULIN GLARGINE 100 UNIT/ML SOLOSTAR PEN: 15.0000 [IU] | PEN_INJECTOR | Freq: Every day | SUBCUTANEOUS | 0 refills | Status: DC

## 2022-01-17 MED ORDER — AMLODIPINE BESYLATE 5 MG PO TABS: 5.0000 mg | ORAL_TABLET | Freq: Every day | ORAL | 0 refills | Status: DC

## 2022-01-17 NOTE — Inpatient Diabetes Management (Signed)
Inpatient Diabetes Program Recommendations  AACE/ADA: New Consensus Statement on Inpatient Glycemic Control (2015)  Target Ranges:  Prepandial:   less than 140 mg/dL      Peak postprandial:   less than 180 mg/dL (1-2 hours)      Critically ill patients:  140 - 180 mg/dL   Lab Results  Component Value Date   GLUCAP 171 (H) 01/17/2022   HGBA1C 6.0 (H) 01/16/2022    Review of Glycemic Control  Latest Reference Range & Units 01/16/22 16:39 01/16/22 19:48 01/16/22 21:14 01/16/22 21:57 01/17/22 06:02  Glucose-Capillary 70 - 99 mg/dL 246 (H) 257 (H) 212 (H) 212 (H) 171 (H)  (H): Data is abnormally high Diabetes history: Type 2 Dm Outpatient Diabetes medications: Lantus 35 units QD, Humalog 1 units TID Current orders for Inpatient glycemic control: Novolog 0-9 units TID & HS, Semglee 15 units QD Decadron 4 mg x 1 on 9/25  Inpatient Diabetes Program Recommendations:    Consider adding Novolog 2 units TID (assuming patient is consuming >50% of meals) if to remain inpatient.   Thanks, Bronson Curb, MSN, RNC-OB Diabetes Coordinator [phone] (8a-5p)

## 2022-01-17 NOTE — TOC Transition Note (Signed)
Transition of Care [facility]) - CM/SW Discharge Note   Patient Details  Name: Cassandra Dodson MRN: [id number] Date of Birth: 1940/06/07  [facility] CM/SW Contact:  Pollie Friar, RN Phone Number: 01/17/2022, 10:35 AM   Clinical Narrative:    Pt is discharging home with home health services through [facility]. Information on the AVS.  Son states she will have 24 hour  supervision at home.  Son providing transport home.   Final next level of care: [place] Barriers to Discharge: No Barriers Identified   Patient Goals and CMS Choice Patient states their goals for this hospitalization and ongoing recovery are:: Son reports to return home CMS Medicare.gov Compare Post Acute Care list provided to:: Patient Represenative (must comment) Choice offered to / list presented to : Adult Children  Discharge Placement                       Discharge Plan and Services                          HH Arranged: OT, PT HH Agency: [facility] Agency Contacted: 01/17/22   Representative spoke with at [place]: Cory--updated on d/c today  Social Determinants of Health (SDOH) Interventions     Readmission Risk Interventions     No data to display

## 2022-01-17 NOTE — Discharge Summary (Signed)
Physician Discharge Summary  Cassandra Dodson ZYS:[id number] DOB: 23-Jan-1941 DOA: 01/11/2022  PCP: Shon Baton, MD  Admit date: 01/11/2022 Discharge date: 01/17/2022 30 Day Unplanned Readmission Risk Score    Flowsheet Row ED to Hosp-Admission (Current) from 01/11/2022 in [facility]  30 Day Unplanned Readmission Risk Score (%) 29.22 Filed at 01/17/2022 0801       This score is the patient's risk of an unplanned readmission within 30 days of being discharged (0 -100%). The score is based on dignosis, age, lab data, medications, orders, and past utilization.   Low:  0-14.9   Medium: 15-21.9   High: 22-29.9   Extreme: 30 and above          Admitted From: Home Disposition: Home  Recommendations for Outpatient Follow-up:  Follow up with PCP in 1-2 weeks Please obtain BMP/CBC in one week Follow up with neurology in 3 to 4 weeks follow-up with neurosurgery in 2 weeks Please follow up with your PCP on the following pending results: Unresulted Labs (From admission, onward)     Start     Ordered   01/18/22 0500  Creatinine, serum  (enoxaparin (LOVENOX)    CrCl >/= 30 ml/min)  Weekly,   R     Comments: while on enoxaparin therapy    01/11/22 2240   01/15/22 0837  Platelet inhibition p2y12 (not at [facility])  Once,   R        01/15/22 [place]: Yes Equipment/Devices: None  Discharge Condition: stable CODE STATUS: Full code Diet recommendation: Cardiac  Subjective: Patient seen and examined.  She has no complaints.  She is alert and oriented to place and self.  That son was at the bedside.  He verified that patient is 90% back at baseline and he is now willing to take her home today.  He is declining SNF.  Brief/Interim Summary: Cassandra Dodson is a 81 y.o. female with medical history significant for early stage uterine cancer, followed by gyn oncology, hypertension, type 2 diabetes, hypothyroidism, GERD, CKD 3B, chronic anxiety/depression,  osteoarthritis, history of CVA and meningioma with left-sided deficit and speech difficulty, who presented to [facility] ED from home via EMS due to confusion for the past 2 days.   -Her work-up was significant for UTI, as well known meningioma 5 cm with local mass effect and edema, but seems to be stable from most recent imaging last July.  As well MRI brain showing acute ischemic event concerning for acute CVA.  Details as below.   Acute metabolic encephalopathy in the setting of UTI and meningioma with vasogenic edema, POA Possible acute CVA - CT head without contrast with stable meningioma measuring 5 cm and associated vasogenic edema and regional mass effect.  -EEG  suggestive of non specific cortical dysfunction arising from bifrontal region. Additionally there is mild diffuse encephalopathy, nonspecific etiology. No seizures or epileptiform discharges were seen throughout the recording -As well MRI  significant for right paramidline pontine infarct, with new punctuate focus fusion abnormality and the previous infarct, possible acute infarct involving in the previous infarct, is concerning for acute CVA, seen by neurology.  They recommended DAPT for 3 weeks and then Plavix alone.  Patient has remained alert and oriented, very close to her baseline for last 2 to 3 days.  She has been cleared by neurosurgery as well as neurology for discharge.  She was evaluated by PT OT  who recommended SNF however patient's son has declined SNF.   5 cm meningioma with associated vasogenic edema and regional mass effect -This appears to be stable from most recent imaging last July, she was seen by neurosurgery, Dr. Ellene Route on 01/15/2022 who did not recommend any intervention at the moment and recommended that she recovers from her current stroke and then he will do further work-up as outpatient. -CVAT Decadron initially, she is currently off Decadron.  Neurosurgery did not recommend any steroids.  She will follow-up with them  in 2 weeks.   Severe sepsis secondary to UTI, POA UA positive for pyuria WBC 13,000, heart rate 108, respiration rate 26, lactic acid 5.0. Most recent urine culture was positive for Pseudomonas aeruginosa, sensitive to cefepime and based on that, patient received almost 6 days of IV cefepime.  Culture this time remain negative.  High anion gap metabolic acidosis likely secondary to lactic acidosis Resolved.   Type 2 diabetes with hyperglycemia Last hemoglobin A1c 7.0 on 10/26/2021. Started on insulin sliding scale, remains elevated , so started on 10 units of Semglee, still elevated, increase Semglee to 15 units and continue SSI.  Initially patient was not taking any Lantus.  I have discharged her on 15 units of Lantus.  She was also not taking metformin.   GERD Resume home PPI   Hyperlipidemia Resumed home Crestor   CKD 3A At baseline with creatinine of 1.02 with GFR of 55. Avoid nephrotoxic agents, dehydration and hypotension. Monitor urine output with strict I's and O's   Obesity BMI 38 Recommend outpatient weight loss with regular physical activity and healthy diet.   History of CVA and left-sided deficits Resumed home DAPT, fenofibrate, Crestor.  Neurology recommended DAPT only for 3 weeks and then Plavix alone.  Home health arranged.  Discharge plan was discussed with patient and/or family member and they verbalized understanding and agreed with it.  Discharge Diagnoses:  Principal Problem:   AMS (altered mental status) Active Problems:   Endometrial cancer ([place])   Hypertension   Brain mass   CVA (cerebral vascular accident) ([place])   Stage 3b chronic kidney disease ([place])   UTI (urinary tract infection)   Type 2 diabetes mellitus with stage 3b chronic kidney disease, without long-term current use of insulin ([place])    Discharge Instructions   Allergies as of 01/17/2022       Reactions   Anesthetics, Amide Other (See Comments)   Pt is intolerant to general  anesthesia. Pt will throw up and has thrown up during procedure.    Ether Nausea And Vomiting        Medication List     STOP taking these medications    ALPRAZolam 0.5 MG tablet Commonly known as: XANAX   metFORMIN 1000 MG tablet Commonly known as: GLUCOPHAGE   Senexon-S 8.6-50 MG tablet Generic drug: senna-docusate   telmisartan 80 MG tablet Commonly known as: MICARDIS       TAKE these medications    amLODipine 5 MG tablet Commonly known as: NORVASC Take 1 tablet (5 mg total) by mouth daily. Start taking on: January 18, 2022 What changed:  medication strength how much to take   aspirin EC 81 MG tablet Take 1 tablet (81 mg total) by mouth daily for 16 days. Swallow whole. Start taking on: January 18, 2022 What changed: additional instructions   calcium carbonate 1250 (500 Ca) MG tablet Commonly known as: OS-CAL - dosed in mg of elemental calcium Take 1 tablet (1,250 mg total)  by mouth daily with breakfast.   clopidogrel 75 MG tablet Commonly known as: PLAVIX Take 1 tablet (75 mg total) by mouth daily.   Desitin 13 % Crea Generic drug: Zinc Oxide Apply 1 application  topically 2 (two) times daily. To buttocks for protection.   docusate sodium 100 MG capsule Commonly known as: COLACE Take 1 capsule (100 mg total) by mouth 2 (two) times daily.   fenofibrate 160 MG tablet Take 1 tablet (160 mg total) by mouth daily after breakfast.   folic acid 1 MG tablet Commonly known as: FOLVITE Take 1 tablet (1 mg total) by mouth daily.   HumaLOG KwikPen 100 UNIT/ML KwikPen Generic drug: insulin lispro Inject 1 Units into the skin 3 (three) times daily with meals.   insulin glargine 100 UNIT/ML Solostar Pen Commonly known as: LANTUS Inject 15 Units into the skin daily. What changed: how much to take   methylphenidate 5 MG tablet Commonly known as: RITALIN Take 1 tablet (5 mg total) by mouth 2 (two) times daily with breakfast and lunch.   mouth rinse  Liqd solution 15 mLs by Mouth Rinse route as needed (for oral care).   pantoprazole 40 MG tablet Commonly known as: PROTONIX Take 1 tablet (40 mg total) by mouth daily.   Pentips 32G X 4 MM Misc Generic drug: Insulin Pen Needle Use daily in the morning, at noon, and at bedtime.   rosuvastatin 20 MG tablet Commonly known as: CRESTOR Take 1 tablet (20 mg total) by mouth daily.   senna 8.6 MG Tabs tablet Commonly known as: SENOKOT Take 13.2 mg by mouth at bedtime as needed for constipation.   traMADol 50 MG tablet Commonly known as: ULTRAM Take 50 mg by mouth daily as needed for pain.        Follow-up Information     [facility] Follow up.   Specialty: [place] Why: The home health agency will contact you for the first home visit Contact information: [place] [address] [phone]         Shon Baton, MD Follow up in 1 week(s).   Specialty: Internal Medicine Contact information: [place] [address] [facility] Follow up in 4 week(s).   Contact information: [address]     [address] [place] [address] [phone]               Allergies  Allergen Reactions   Anesthetics, Amide Other (See Comments)    Pt is intolerant to general anesthesia. Pt will throw up and has thrown up during procedure.    Ether Nausea And Vomiting    Consultations: Neurology and neurosurgery   Procedures/Studies: CT ANGIO HEAD W OR WO CONTRAST  Result Date: 01/16/2022 CLINICAL DATA:  Stroke follow-up EXAM: CT ANGIOGRAPHY HEAD TECHNIQUE: Multidetector CT imaging of the head was performed using the standard protocol during bolus administration of intravenous contrast. Multiplanar CT image reconstructions and MIPs were obtained to evaluate the vascular anatomy. RADIATION DOSE REDUCTION: This exam was performed according to the departmental  dose-optimization program which includes automated exposure control, adjustment of the mA and/or kV according to patient size and/or use of iterative reconstruction technique. CONTRAST:  72m OMNIPAQUE IOHEXOL 350 MG/ML SOLN COMPARISON:  Brain CT 10/25/2021, MRI brain 01/13/2022 and 11/15/2021 FINDINGS: CT HEAD Brain: Redemonstrated is large planum sphenoidale meningioma measuring 4.3 x 3.3 x 4.2 cm with surrounding  vasogenic edema. There is associated mass effect with a proximally 1.8 cm rightward midline shift of the medial left frontal lobe cortex. There is sequela of moderate chronic microvascular disease with a chronic infarct in the left corona radiata (series 7, image 20), a chronic infarct in the right cerebellum (series 7, image 7), and also an evolving acute infarct in the right pons (series 7, image 12). Vascular: Redemonstrated basilar artery stent, new compared to 10/25/2021. There is a large vessel that traverses the center of this mass (series 11, image 81). The arterial vasculature likely arises from the ethmoidal arteries. Skull: Normal. Negative for fracture or focal lesion. Sinuses: No acute or significant finding. Other: None. CTA HEAD Anterior circulation: ACA-there is mild focal stenosis in the proximal right A1 segment. Bilateral A2 segments are displaced to the right secondary to mass effect from the large planum sphenoidale meningioma. Both are patent. MCA- Carotids-mild focal stenosis of the cavernous segment of the right ICA on the right (series 12, image 86). Posterior circulation: PCA-there is a focal area of moderate to high grade narrowing at the P1 P2 junction on the right (series 11, image 99). The P2 segment of the left PCA predominantly receives its blood supply from the left PCOM (compatible with a fetal type left PCA). Basilar-Redemonstrated stent within the basilar artery. The patency of the stent is difficult to assess. There is mild narrowing of the basilar artery along the  proximal aspect of the stent. Vertebral-The left vertebral artery demonstrates multifocal areas of narrowing and is likely focally occluded in the distal V4 segment (series 11, image 131), new compared to 10/26/2021. Venous sinuses: Patency of the venous sinuses is incompletely assessed due to poor opacification. Anatomic variants: Fetal type PCA on the left. Review of the MIP images confirms the above findings. IMPRESSION: 1. Redemonstrated large planum sphenoidale meningioma with surrounding vasogenic edema and mass effect. Bilateral A2 segments are rightwardly displaced, but patent. This mass demonstrates central vascularity, which likely arises from the ethmoidal arteries. 2. Multifocal areas of narrowing in the left vertebral artery, which is likely focally occluded in the distal V4 segment, new compared to 10/26/2021. 3. Redemonstrated basilar artery stent, new compared to 10/25/2021. The patency of the stent is difficult to assess. There is mild narrowing of the basilar artery along the proximal aspect of the stent. Electronically Signed   By: Marin Roberts M.D.   On: 01/16/2022 09:08   MR BRAIN W WO CONTRAST  Result Date: 01/13/2022 CLINICAL DATA:  Left-sided weakness. Meningioma. EXAM: MRI HEAD WITHOUT AND WITH CONTRAST TECHNIQUE: Multiplanar, multiecho pulse sequences of the brain and surrounding structures were obtained without and with intravenous contrast. CONTRAST:  10 mL Vueway COMPARISON:  MR head without contrast 11/15/2021. MR head without and with contrast 10/25/2021. 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 Surrounding vasogenic edema is stable. Subfalcine midline shift again noted. Edema extends into the left external capsule and superiorly in the frontal lobe. Edema within the anterior genu of the corpus callosum is stable. Continued evolution of the right paramidline pontine infarct is noted. Remote posterior right cerebellar infarct is  present. Remote lacunar infarct is present in the left thalamus. Periventricular white matter changes are stable. Diffusion-weighted images demonstrate a punctate focus of restricted diffusion within the anterior aspect of the right paramedian pontine infarct. Vascular: Abnormal signal is again noted within the left vertebral artery suggesting slower occluded flow. Flow is present in the right vertebral  artery and basilar artery. Flow is present in the anterior circulation. Skull and upper cervical spine: The craniocervical junction is normal. Upper cervical spine is within normal limits. Marrow signal is unremarkable. Sinuses/Orbits: The paranasal sinuses and mastoid air cells are clear. The globes and orbits are within normal limits. IMPRESSION: 1. Continued evolution of the right paramidline pontine infarct. 2. Punctate focus of diffusion abnormality within the anterior aspect of the previous infarct. This may represent acute infarct of some residual tissue. It is largely encompassed within the evolving prior infarct. 3. Stable appearance of a sphenoidale meningioma with surrounding vasogenic edema and midline shift. 4. Remote posterior right cerebellar infarct. 5. Remote lacunar infarct of the left thalamus. 6. Stable white matter disease. Electronically Signed   By: San Morelle M.D.   On: 01/13/2022 16:55   EEG adult  Result Date: 01/12/2022 Lora Havens, MD     01/12/2022  8:39 PM Patient Name: Cassandra Dodson MRN: [id number] Epilepsy Attending: Lora Havens Referring Physician/Provider: Albertine Patricia, MD Date: 01/12/2022 Duration: 23 mins Patient history: 81yo f with ams and meningioma. EEG to evaluate for seizure Level of alertness: Awake AEDs during EEG study: None Technical aspects: This EEG study was done with scalp electrodes positioned according to the 10-20 International system of electrode placement. Electrical activity was reviewed with band pass filter of 1-'70Hz'$ , sensitivity of  7 uV/mm, display speed of 73m/sec with a '60Hz'$  notched filter applied as appropriate. EEG data were recorded continuously and digitally stored.  Video monitoring was available and reviewed as appropriate. Description: The posterior dominant rhythm consists of '8Hz'$  activity of moderate voltage (25-35 uV) seen predominantly in posterior head regions, symmetric and reactive to eye opening and eye closing. EEG showed intermittent generalized and maximal bifrontal 3 to 6 Hz theta-delta slowing. Hyperventilation and photic stimulation were not performed.   ABNORMALITY - Intermittent slow, generalized and maximal bifrontal IMPRESSION: This study is suggestive of non specific cortical dysfunction arising from bifrontal region. Additionally there is mild diffuse encephalopathy, nonspecific etiology. No seizures or epileptiform discharges were seen throughout the recording. PLora Havens  CT Head Wo Contrast  Result Date: 01/11/2022 CLINICAL DATA:  Initial evaluation for mental status change, unknown cause. EXAM: CT HEAD WITHOUT CONTRAST TECHNIQUE: Contiguous axial images were obtained from the base of the skull through the vertex without intravenous contrast. RADIATION DOSE REDUCTION: This exam was performed according to the departmental dose-optimization program which includes automated exposure control, adjustment of the mA and/or kV according to patient size and/or use of iterative reconstruction technique. COMPARISON:  Prior study from 11/15/2021. FINDINGS: Brain: Age-related cerebral atrophy with chronic small vessel ischemic disease. Chronic right cerebellar infarct noted. Additional remote lacunar infarcts noted about the thalami. No acute intracranial hemorrhage. No visible acute large vessel territory infarct. Large meningioma positioned along the planum sphenoidale again seen relatively stable in size measuring 5.0 x 4.3 x 3.5 cm. Surrounding vasogenic edema is similar. Associated left-to-right shift of up  to approximately 2 cm, stable. No other mass lesion. No hydrocephalus or extra-axial fluid collection. Vascular: Calcified atherosclerosis present at the skull base with vascular stent in place within the basilar. No abnormal hyperdense vessel. Skull: Scalp soft tissues and calvarium within normal limits. Sinuses/Orbits: Globes orbital soft tissues within normal limits. Paranasal sinuses and mastoid air cells are largely clear. Other: None. IMPRESSION: 1. No acute intracranial abnormality. 2. Stable size and appearance of large 5 cm meningioma positioned along the planum sphenoidale with associated vasogenic edema  and regional mass effect. 3. Age-related cerebral atrophy with chronic small vessel ischemic disease with chronic right cerebellar and thalamic infarcts. Electronically Signed   By: Jeannine Boga M.D.   On: 01/11/2022 20:18   DG Chest Port 1 View  Result Date: 01/11/2022 CLINICAL DATA:  Altered mental status EXAM: PORTABLE CHEST 1 VIEW COMPARISON:  11/16/2021 FINDINGS: Cardiac size is within normal limits. Thoracic aorta is tortuous. Lung fields are clear of any infiltrates or pulmonary edema. There is no pleural effusion or pneumothorax. Degenerative changes are noted in left shoulder. IMPRESSION: No active disease. Electronically Signed   By: Elmer Picker M.D.   On: 01/11/2022 19:41   IR Radiologist Eval & Mgmt  Result Date: 12/19/2021 EXAM: ESTABLISHED PATIENT OFFICE VISIT CHIEF COMPLAINT: Follow-up visit. 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 Postprocedural made excellent recovery to her preprocedural neurological function. She was then discharged to inpatient rehab and then subsequently to home rehab living with her son. The patient returns today accompanied by her son. According to the son, the patient  continues to make slow but steady progress. Her main difficulty at this time is ambulation due to deconditioning, and lower extremity weakness. She is scheduled for outpatient physical and occupational therapy starting next week. At the present time, she needs almost constant attention with home health during daytime, and son at bedtime. According to the son, he has noticed increased strength in her arms and legs. Continues to have difficulty standing even with a walker. She is mentally alert. She needs help with daily living activities such as washing, bathing. She is able to feed herself and use the TV remote. The patient has noticed no evidence of difficulty breathing, wheezing, chest pains, coughing or spitting up blood, or complaint of abdominal pains, constipation, diarrhea or difficulty passing urine. The son has noticed no fever, chills or rigors Diagnosis * : Date . * : Arthritis * : . * : CKD (chronic kidney disease), stage III ([place]) * : * : patient denies . * : Depression * : . * : Diabetes mellitus without complication ([place]) * : . * : Difficult intravenous access * : . * : Endometrial cancer ([place]) * : . * : GERD (gastroesophageal reflux disease) * : . * : Headache * : . * : History of radiation therapy * : 11/04/2019-12/01/2019 * : Endometrial HDR; Dr. Gery Pray . * : Hypertension * : . * : Hypothyroidism * : . * : Neuromuscular disorder ([place]) * : * : neuropathy in feet . * : Osteoarthritis * : . * : PMB (postmenopausal bleeding) * : . * : PONV (postoperative nausea and vomiting) * : * : severe nausea and vomiting after knee replacement 06-2014, did ok with 2018 knee replacement . * : Urinary frequency * : . * : Wears glasses * : Past Surgical History: Procedure * : Laterality * : Date . * : BREAST SURGERY * : * : * : cyst removed . * : CESAREAN SECTION * : * : . * : DILATION AND CURETTAGE OF UTERUS * : N/A * : 06/24/2019 * : Procedure: DILATATION AND CURETTAGE; Surgeon: Lafonda Mosses, MD;  Location: [facility]; Service: Gynecology; Laterality: N/A; . * : FRACTURE SURGERY * : * : * : right knee . * : INTRAUTERINE DEVICE (IUD) INSERTION * : N/A * :  06/24/2019 * : Procedure: INTRAUTERINE DEVICE (IUD) INSERTION MIRENA; Surgeon: Lafonda Mosses, MD; Location: [facility]; Service: Gynecology; Laterality: N/A; . * : IRRIGATION AND DEBRIDEMENT SEBACEOUS CYST * : * : . * : JOINT REPLACEMENT * : Right * : * : right . * : LYMPH NODE DISSECTION * : N/A * : 09/14/2019 * : Procedure: LYMPH NODE DISSECTION; Surgeon: Lafonda Mosses, MD; Location: [facility]; Service: Gynecology; Laterality: N/A; . * : ROBOTIC ASSISTED TOTAL HYSTERECTOMY WITH BILATERAL SALPINGO OOPHERECTOMY * : Bilateral * : 09/14/2019 * : Procedure: XI ROBOTIC ASSISTED TOTAL HYSTERECTOMY WITH BILATERAL SALPINGO OOPHORECTOMY; Surgeon: Lafonda Mosses, MD; Location: [facility]; Service: Gynecology; Laterality: Bilateral; . * : SENTINEL NODE BIOPSY * : N/A * : 09/14/2019 * : Procedure: SENTINEL NODE BIOPSY; Surgeon: Lafonda Mosses, MD; Location: [facility]; Service: Gynecology; Laterality: N/A; . * : teeth extraction * : * : . * : TONSILLECTOMY * : * : . * : TOTAL HIP ARTHROPLASTY * : Right * : 02/23/2015 * : Procedure: RIGHT TOTAL HIP ARTHROPLASTY ANTERIOR APPROACH; Surgeon: Leandrew Koyanagi, MD; Location: [facility]; Service: Orthopedics; Laterality: Right; . * : TOTAL KNEE ARTHROPLASTY * : Left * : 06/27/2016 * : Procedure: LEFT TOTAL KNEE ARTHROPLASTY; Surgeon: Leandrew Koyanagi, MD; Location: [place]; Service: Orthopedics; Laterality: Left; Allergies: Anesthetics, amide and Ether Medications: ALPRAZolam 0.5 MG tablet--Rx# 10 pills commonly known as: XANAX take 0.5 tablets (0.25 mg total) by mouth at bedtime as needed for sleep. What changed: how much to take amLODipine 10 MG tablet commonly known as: NORVASC take 1 tablet (10 mg total) by mouth daily. Aspirin Low Dose 81 MG tablet generic drug: aspirin EC take 1 tablet (81 mg total)  by mouth daily. What changed: additional instructions calcium carbonate 1250 (500 Ca) MG tablet commonly known as: OS-CAL - dosed in mg of elemental calcium take 1 tablet (1,250 mg total) by mouth daily with breakfast. clopidogrel 75 MG tablet commonly known as: PLAVIX take 1 tablet (75 mg total) by mouth daily. Desitin 13 % Crea generic drug: Zinc Oxide apply 1 application topically 2 (two) times daily. To buttocks for protection. docusate sodium 100 MG capsule commonly known as: COLACE take 1 capsule (100 mg total) by mouth 2 (two) times daily. fenofibrate 160 MG tablet take 1 tablet (160 mg total) by mouth daily after breakfast. Folic acid 1 MG tablet commonly known as: FOLVITE take 1 tablet (1 mg total) by mouth daily. HumaLOG KwikPen 100 UNIT/ML KwikPen generic drug: insulin lispro inject 1 Units into the skin 3 (three) times daily with meals. Insulin glargine 100 UNIT/ML Solostar Pen commonly known as: LANTUSInject 35 Units into the skin daily. Methylphenidate 5 MG tablet--Rx # 60 pills. Commonly known as: RITALIN take 1 tablet (5 mg total) by mouth 2 (two) times daily with breakfast and lunch. Mouth rinse Liqd solution15 mLs by Mouth Rinse route as needed (for oral care). Pantoprazole 40 MG tablet commonly known as: PROTONIX take 1 tablet (40 mg total) by mouth daily. Pentips 32G X 4 MM Misc generic drug: Insulin Pen Needle use daily in the morning, at noon, and at bedtime. Rosuvastatin 20 MG tablet commonly known as: CRESTOR take 1 tablet (20 mg total) by mouth daily. Senexon-S 8.6-50 MG tablet generic drug: Senna-docusate take 2 tablets by mouth daily after breakfast. REVIEW OF SYSTEMS: Negative unless as mentioned. PHYSICAL EXAMINATION: Neurologically the patient appears alert. She is oriented to time, year and month. She knows she  is at [facility]. Responses are quick and appropriate. Comprehension intact. No gross facial asymmetry. Tongue midline. The patient is able to raise both arms right  more so than left without pronation drift. Lower extremities not examined. ASSESSMENT AND PLAN: Son advised to ensure the patient is taking her daily medications including aspirin and Plavix, maintaining adequate hydration due to her abnormal BUN and creatinine. Son instructed to keep appointment with neurology next week. Follow-up MRI brain and MRA of the brain 4 months from today. Electronically Signed   By: Luanne Bras M.D.   On: 12/19/2021 08:01     Discharge Exam: Vitals:   01/17/22 0424 01/17/22 0724  BP: (!) 140/95 129/87  Pulse: 71 89  Resp: 17 16  Temp: 98.2 F (36.8 C) 98 F (36.7 C)  SpO2: 97% 99%   Vitals:   01/16/22 2000 01/17/22 0030 01/17/22 0424 01/17/22 0724  BP: 137/68 (!) 141/71 (!) 140/95 129/87  Pulse:  83 71 89  Resp: '18 18 17 16  '$ Temp: 97.8 F (36.6 C) (!) 97.4 F (36.3 C) 98.2 F (36.8 C) 98 F (36.7 C)  TempSrc: Axillary Oral  Oral  SpO2:  99% 97% 99%  Weight:        General: Pt is alert, awake, not in acute distress Cardiovascular: RRR, S1/S2 +, no rubs, no gallops Respiratory: CTA bilaterally, no wheezing, no rhonchi Abdominal: Soft, NT, ND, bowel sounds + Extremities: no edema, no cyanosis Neuro: Left hemiparesis.  4/5 power in left upper and lower extremity.  No other focal deficit.   The results of significant diagnostics from this hospitalization (including imaging, microbiology, ancillary and laboratory) are listed below for reference.     Microbiology: Recent Results (from the past 240 hour(s))  Urine Culture     Status: Abnormal   Collection Time: 01/11/22  6:52 PM   Specimen: In/Out Cath Urine  Result Value Ref Range Status   Specimen Description IN/OUT CATH URINE  Final   Special Requests   Final    NONE Performed at [facility], [address], [place], [place] [address]    Culture MULTIPLE SPECIES PRESENT, SUGGEST RECOLLECTION (A)  Final   Report Status 01/13/2022 FINAL  Final  Culture, blood (Routine X 2) w  Reflex to ID Panel     Status: None   Collection Time: 01/12/22  3:37 AM   Specimen: BLOOD LEFT HAND  Result Value Ref Range Status   Specimen Description BLOOD LEFT HAND  Final   Special Requests   Final    BOTTLES DRAWN AEROBIC AND ANAEROBIC Blood Culture adequate volume   Culture   Final    NO GROWTH 5 DAYS Performed at [facility], [place] [address] [place], [place] [address]    Report Status 01/17/2022 FINAL  Final  Culture, blood (Routine X 2) w Reflex to ID Panel     Status: None   Collection Time: 01/12/22  3:38 AM   Specimen: BLOOD LEFT HAND  Result Value Ref Range Status   Specimen Description BLOOD LEFT HAND  Final   Special Requests   Final    BOTTLES DRAWN AEROBIC AND ANAEROBIC Blood Culture adequate volume   Culture   Final    NO GROWTH 5 DAYS Performed at [facility], [place] [address] [place], [place] [address]    Report Status 01/17/2022 FINAL  Final     Labs: BNP (last 3 results) No results for input(s): "BNP" in the  last 8760 hours. Basic Metabolic Panel: Recent Labs  Lab 01/11/22 2128 01/12/22 0654 01/13/22 0757 01/14/22 0901 01/17/22 0551  NA 137 137 139 136 132*  K 4.0 3.9 3.0* 4.4 4.2  CL 104 107 109 106 100  CO2 17* 20* 21* 21* 23  GLUCOSE 229* 254* 155* 166* 194*  BUN '19 19 15 16 '$ 32*  CREATININE 1.02* 0.75 0.69 0.81 0.85  CALCIUM 10.2 9.5 8.7* 9.2 10.1  MG  --  1.6* 1.8  --  1.7  PHOS  --  2.4* 1.8* 2.2*  --    Liver Function Tests: Recent Labs  Lab 01/11/22 2128 01/12/22 0654  AST 21 14*  ALT 8 9  ALKPHOS 62 58  BILITOT 0.5 0.4  PROT 6.5 6.0*  ALBUMIN 3.3* 3.0*   No results for input(s): "LIPASE", "AMYLASE" in the last 168 hours. Recent Labs  Lab 01/11/22 2047  AMMONIA 13   CBC: Recent Labs  Lab 01/11/22 1940 01/12/22 0654 01/13/22 0757 01/14/22 0901  WBC 13.2* 7.0 7.5 7.3  NEUTROABS 11.1* 6.2  --   --   HGB 15.4* 13.2 12.2 12.6  HCT 46.8* 39.9 35.7* 37.9  MCV 92.1 91.7 90.8 91.5  PLT 221 165 147* 176    Cardiac Enzymes: No results for input(s): "CKTOTAL", "CKMB", "CKMBINDEX", "TROPONINI" in the last 168 hours. BNP: Invalid input(s): "POCBNP" CBG: Recent Labs  Lab 01/16/22 1639 01/16/22 1948 01/16/22 2114 01/16/22 2157 01/17/22 0602  GLUCAP 246* 257* 212* 212* 171*   D-Dimer No results for input(s): "DDIMER" in the last 72 hours. Hgb A1c Recent Labs    01/16/22 0852  HGBA1C 6.0*   Lipid Profile Recent Labs    01/16/22 0852  CHOL 118  HDL 48  LDLCALC 44  TRIG 128  CHOLHDL 2.5   Thyroid function studies No results for input(s): "TSH", "T4TOTAL", "T3FREE", "THYROIDAB" in the last 72 hours.  Invalid input(s): "FREET3" Anemia work up No results for input(s): "VITAMINB12", "FOLATE", "FERRITIN", "TIBC", "IRON", "RETICCTPCT" in the last 72 hours. Urinalysis    Component Value Date/Time   COLORURINE YELLOW 01/11/2022 2039   APPEARANCEUR CLOUDY (A) 01/11/2022 2039   LABSPEC >1.030 (H) 01/11/2022 2039   PHURINE 6.0 01/11/2022 2039   GLUCOSEU NEGATIVE 01/11/2022 2039   HGBUR NEGATIVE 01/11/2022 2039   BILIRUBINUR NEGATIVE 01/11/2022 2039   KETONESUR NEGATIVE 01/11/2022 2039   PROTEINUR 30 (A) 01/11/2022 2039   UROBILINOGEN 0.2 02/13/2015 1305   NITRITE NEGATIVE 01/11/2022 2039   LEUKOCYTESUR MODERATE (A) 01/11/2022 2039   Sepsis Labs Recent Labs  Lab 01/11/22 1940 01/12/22 0654 01/13/22 0757 01/14/22 0901  WBC 13.2* 7.0 7.5 7.3   Microbiology Recent Results (from the past 240 hour(s))  Urine Culture     Status: Abnormal   Collection Time: 01/11/22  6:52 PM   Specimen: In/Out Cath Urine  Result Value Ref Range Status   Specimen Description IN/OUT CATH URINE  Final   Special Requests   Final    NONE Performed at [facility], [place] [address], [place], [place] [address]    Culture MULTIPLE SPECIES PRESENT, SUGGEST RECOLLECTION (A)  Final   Report Status 01/13/2022 FINAL  Final  Culture, blood (Routine X 2) w Reflex to ID Panel     Status: None    Collection Time: 01/12/22  3:37 AM   Specimen: BLOOD LEFT HAND  Result Value Ref Range Status   Specimen Description BLOOD LEFT HAND  Final   Special Requests   Final  BOTTLES DRAWN AEROBIC AND ANAEROBIC Blood Culture adequate volume   Culture   Final    NO GROWTH 5 DAYS Performed at [facility], [place] [address], [place], [place] [address]    Report Status 01/17/2022 FINAL  Final  Culture, blood (Routine X 2) w Reflex to ID Panel     Status: None   Collection Time: 01/12/22  3:38 AM   Specimen: BLOOD LEFT HAND  Result Value Ref Range Status   Specimen Description BLOOD LEFT HAND  Final   Special Requests   Final    BOTTLES DRAWN AEROBIC AND ANAEROBIC Blood Culture adequate volume   Culture   Final    NO GROWTH 5 DAYS Performed at [facility], [place] [address] [place], [place] [address]    Report Status 01/17/2022 FINAL  Final     Time coordinating discharge: Over 30 minutes  SIGNED:   Darliss Cheney, MD  [facility] 01/17/2022, 9:53 AM *Please note that this is a verbal dictation therefore any spelling or grammatical errors are due to the "[place] [employer]" system interpretation. If 7PM-7AM, please contact night-coverage [email or web address]

## 2022-01-18 ENCOUNTER — Encounter
Payer: Medicare Other | Attending: Physical Medicine and Rehabilitation | Admitting: Physical Medicine and Rehabilitation

## 2022-01-19 LAB — PLATELET INHIBITION P2Y12: Platelet Function  P2Y12: 52 [PRU] — ABNORMAL LOW (ref 182–335)

## 2022-01-26 ENCOUNTER — Inpatient Hospital Stay (HOSPITAL_COMMUNITY)
Admission: EM | Admit: 2022-01-26 | Discharge: 2022-02-16 | DRG: 163 | Disposition: A | Discharge status: Skilled Nursing Facility | Payer: Medicare Other | Attending: Internal Medicine | Admitting: Internal Medicine

## 2022-01-26 ENCOUNTER — Emergency Department (HOSPITAL_COMMUNITY): Payer: Medicare Other

## 2022-01-26 ENCOUNTER — Inpatient Hospital Stay (HOSPITAL_COMMUNITY): Payer: Medicare Other

## 2022-01-26 DIAGNOSIS — Z794 Long term (current) use of insulin: Secondary | ICD-10-CM | POA: Diagnosis not present

## 2022-01-26 DIAGNOSIS — Z86711 Personal history of pulmonary embolism: Secondary | ICD-10-CM

## 2022-01-26 DIAGNOSIS — J9601 Acute respiratory failure with hypoxia: Secondary | ICD-10-CM | POA: Diagnosis present

## 2022-01-26 DIAGNOSIS — Y838 Other surgical procedures as the cause of abnormal reaction of the patient, or of later complication, without mention of misadventure at the time of the procedure: Secondary | ICD-10-CM | POA: Diagnosis not present

## 2022-01-26 DIAGNOSIS — Z8673 Personal history of transient ischemic attack (TIA), and cerebral infarction without residual deficits: Secondary | ICD-10-CM

## 2022-01-26 DIAGNOSIS — K5730 Diverticulosis of large intestine without perforation or abscess without bleeding: Secondary | ICD-10-CM | POA: Diagnosis present

## 2022-01-26 DIAGNOSIS — Z6832 Body mass index (BMI) 32.0-32.9, adult: Secondary | ICD-10-CM | POA: Diagnosis not present

## 2022-01-26 DIAGNOSIS — J189 Pneumonia, unspecified organism: Secondary | ICD-10-CM | POA: Diagnosis not present

## 2022-01-26 DIAGNOSIS — Z993 Dependence on wheelchair: Secondary | ICD-10-CM

## 2022-01-26 DIAGNOSIS — E1122 Type 2 diabetes mellitus with diabetic chronic kidney disease: Secondary | ICD-10-CM | POA: Diagnosis present

## 2022-01-26 DIAGNOSIS — G928 Other toxic encephalopathy: Secondary | ICD-10-CM | POA: Diagnosis present

## 2022-01-26 DIAGNOSIS — D62 Acute posthemorrhagic anemia: Secondary | ICD-10-CM | POA: Diagnosis not present

## 2022-01-26 DIAGNOSIS — F0390 Unspecified dementia without behavioral disturbance: Secondary | ICD-10-CM | POA: Diagnosis present

## 2022-01-26 DIAGNOSIS — I129 Hypertensive chronic kidney disease with stage 1 through stage 4 chronic kidney disease, or unspecified chronic kidney disease: Secondary | ICD-10-CM | POA: Diagnosis present

## 2022-01-26 DIAGNOSIS — N183 Chronic kidney disease, stage 3 unspecified: Secondary | ICD-10-CM | POA: Diagnosis present

## 2022-01-26 DIAGNOSIS — L89151 Pressure ulcer of sacral region, stage 1: Secondary | ICD-10-CM | POA: Diagnosis present

## 2022-01-26 DIAGNOSIS — I4891 Unspecified atrial fibrillation: Secondary | ICD-10-CM | POA: Diagnosis present

## 2022-01-26 DIAGNOSIS — E1165 Type 2 diabetes mellitus with hyperglycemia: Secondary | ICD-10-CM | POA: Diagnosis present

## 2022-01-26 DIAGNOSIS — G9389 Other specified disorders of brain: Secondary | ICD-10-CM | POA: Diagnosis not present

## 2022-01-26 DIAGNOSIS — Z7902 Long term (current) use of antithrombotics/antiplatelets: Secondary | ICD-10-CM

## 2022-01-26 DIAGNOSIS — Z7189 Other specified counseling: Secondary | ICD-10-CM | POA: Diagnosis not present

## 2022-01-26 DIAGNOSIS — I2609 Other pulmonary embolism with acute cor pulmonale: Secondary | ICD-10-CM | POA: Diagnosis present

## 2022-01-26 DIAGNOSIS — Z8619 Personal history of other infectious and parasitic diseases: Secondary | ICD-10-CM

## 2022-01-26 DIAGNOSIS — E440 Moderate protein-calorie malnutrition: Secondary | ICD-10-CM | POA: Diagnosis present

## 2022-01-26 DIAGNOSIS — I700 Atherosclerosis of aorta: Secondary | ICD-10-CM | POA: Diagnosis present

## 2022-01-26 DIAGNOSIS — L89316 Pressure-induced deep tissue damage of right buttock: Secondary | ICD-10-CM | POA: Diagnosis not present

## 2022-01-26 DIAGNOSIS — E785 Hyperlipidemia, unspecified: Secondary | ICD-10-CM | POA: Diagnosis present

## 2022-01-26 DIAGNOSIS — I2699 Other pulmonary embolism without acute cor pulmonale: Secondary | ICD-10-CM | POA: Diagnosis not present

## 2022-01-26 DIAGNOSIS — F32A Depression, unspecified: Secondary | ICD-10-CM | POA: Diagnosis present

## 2022-01-26 DIAGNOSIS — B961 Klebsiella pneumoniae [K. pneumoniae] as the cause of diseases classified elsewhere: Secondary | ICD-10-CM | POA: Diagnosis not present

## 2022-01-26 DIAGNOSIS — I2602 Saddle embolus of pulmonary artery with acute cor pulmonale: Principal | ICD-10-CM | POA: Diagnosis present

## 2022-01-26 DIAGNOSIS — Z515 Encounter for palliative care: Secondary | ICD-10-CM

## 2022-01-26 DIAGNOSIS — E119 Type 2 diabetes mellitus without complications: Secondary | ICD-10-CM | POA: Diagnosis not present

## 2022-01-26 DIAGNOSIS — J9811 Atelectasis: Secondary | ICD-10-CM | POA: Diagnosis present

## 2022-01-26 DIAGNOSIS — L8990 Pressure ulcer of unspecified site, unspecified stage: Secondary | ICD-10-CM | POA: Diagnosis present

## 2022-01-26 DIAGNOSIS — Z1152 Encounter for screening for COVID-19: Secondary | ICD-10-CM | POA: Diagnosis not present

## 2022-01-26 DIAGNOSIS — E876 Hypokalemia: Secondary | ICD-10-CM | POA: Diagnosis present

## 2022-01-26 DIAGNOSIS — I6322 Cerebral infarction due to unspecified occlusion or stenosis of basilar arteries: Secondary | ICD-10-CM | POA: Diagnosis not present

## 2022-01-26 DIAGNOSIS — M6281 Muscle weakness (generalized): Secondary | ICD-10-CM | POA: Diagnosis present

## 2022-01-26 DIAGNOSIS — Z7984 Long term (current) use of oral hypoglycemic drugs: Secondary | ICD-10-CM | POA: Diagnosis not present

## 2022-01-26 DIAGNOSIS — Z823 Family history of stroke: Secondary | ICD-10-CM

## 2022-01-26 DIAGNOSIS — I959 Hypotension, unspecified: Secondary | ICD-10-CM | POA: Diagnosis present

## 2022-01-26 DIAGNOSIS — G936 Cerebral edema: Secondary | ICD-10-CM | POA: Diagnosis present

## 2022-01-26 DIAGNOSIS — Z800 Family history of malignant neoplasm of digestive organs: Secondary | ICD-10-CM

## 2022-01-26 DIAGNOSIS — K219 Gastro-esophageal reflux disease without esophagitis: Secondary | ICD-10-CM | POA: Diagnosis present

## 2022-01-26 DIAGNOSIS — N390 Urinary tract infection, site not specified: Secondary | ICD-10-CM | POA: Diagnosis present

## 2022-01-26 DIAGNOSIS — Z90710 Acquired absence of both cervix and uterus: Secondary | ICD-10-CM

## 2022-01-26 DIAGNOSIS — L89621 Pressure ulcer of left heel, stage 1: Secondary | ICD-10-CM | POA: Diagnosis present

## 2022-01-26 DIAGNOSIS — D696 Thrombocytopenia, unspecified: Secondary | ICD-10-CM | POA: Diagnosis not present

## 2022-01-26 DIAGNOSIS — Z96641 Presence of right artificial hip joint: Secondary | ICD-10-CM | POA: Diagnosis present

## 2022-01-26 DIAGNOSIS — I639 Cerebral infarction, unspecified: Secondary | ICD-10-CM | POA: Diagnosis present

## 2022-01-26 DIAGNOSIS — E039 Hypothyroidism, unspecified: Secondary | ICD-10-CM | POA: Diagnosis present

## 2022-01-26 DIAGNOSIS — D329 Benign neoplasm of meninges, unspecified: Secondary | ICD-10-CM | POA: Diagnosis present

## 2022-01-26 DIAGNOSIS — Z923 Personal history of irradiation: Secondary | ICD-10-CM

## 2022-01-26 DIAGNOSIS — Z8542 Personal history of malignant neoplasm of other parts of uterus: Secondary | ICD-10-CM

## 2022-01-26 DIAGNOSIS — Z8249 Family history of ischemic heart disease and other diseases of the circulatory system: Secondary | ICD-10-CM

## 2022-01-26 DIAGNOSIS — R1310 Dysphagia, unspecified: Secondary | ICD-10-CM | POA: Diagnosis not present

## 2022-01-26 DIAGNOSIS — I10 Essential (primary) hypertension: Secondary | ICD-10-CM | POA: Diagnosis not present

## 2022-01-26 DIAGNOSIS — R748 Abnormal levels of other serum enzymes: Secondary | ICD-10-CM | POA: Diagnosis not present

## 2022-01-26 DIAGNOSIS — Z79899 Other long term (current) drug therapy: Secondary | ICD-10-CM

## 2022-01-26 DIAGNOSIS — Z884 Allergy status to anesthetic agent status: Secondary | ICD-10-CM

## 2022-01-26 DIAGNOSIS — T82838A Hemorrhage of vascular prosthetic devices, implants and grafts, initial encounter: Secondary | ICD-10-CM | POA: Diagnosis not present

## 2022-01-26 DIAGNOSIS — Z96652 Presence of left artificial knee joint: Secondary | ICD-10-CM | POA: Diagnosis present

## 2022-01-26 DIAGNOSIS — Z7982 Long term (current) use of aspirin: Secondary | ICD-10-CM

## 2022-01-26 DIAGNOSIS — Z87891 Personal history of nicotine dependence: Secondary | ICD-10-CM

## 2022-01-26 LAB — COMPREHENSIVE METABOLIC PANEL
ALT: 16 U/L (ref 0–44)
AST: 23 U/L (ref 15–41)
Albumin: 2.7 g/dL — ABNORMAL LOW (ref 3.5–5.0)
Alkaline Phosphatase: 93 U/L (ref 38–126)
Anion gap: 12 (ref 5–15)
BUN: 17 mg/dL (ref 8–23)
CO2: 19 mmol/L — ABNORMAL LOW (ref 22–32)
Calcium: 9.3 mg/dL (ref 8.9–10.3)
Chloride: 102 mmol/L (ref 98–111)
Creatinine, Ser: 0.92 mg/dL (ref 0.44–1.00)
GFR, Estimated: >60 mL/min (ref >60)
Glucose, Bld: 321 mg/dL — ABNORMAL HIGH (ref 70–99)
Potassium: 3.8 mmol/L (ref 3.5–5.1)
Sodium: 133 mmol/L — ABNORMAL LOW (ref 135–145)
Total Bilirubin: 1.1 mg/dL (ref 0.3–1.2)
Total Protein: 5.9 g/dL — ABNORMAL LOW (ref 6.5–8.1)

## 2022-01-26 LAB — ECHOCARDIOGRAM LIMITED
Height: 63 in
Weight: 3672 oz

## 2022-01-26 LAB — I-STAT CHEM 8, ED
BUN: 17 mg/dL (ref 8–23)
Calcium, Ion: 1.26 mmol/L (ref 1.15–1.40)
Chloride: 102 mmol/L (ref 98–111)
Creatinine, Ser: 0.7 mg/dL (ref 0.44–1.00)
Glucose, Bld: 323 mg/dL — ABNORMAL HIGH (ref 70–99)
HCT: 37 % (ref 36.0–46.0)
Hemoglobin: 12.6 g/dL (ref 12.0–15.0)
Potassium: 3.8 mmol/L (ref 3.5–5.1)
Sodium: 133 mmol/L — ABNORMAL LOW (ref 135–145)
TCO2: 21 mmol/L — ABNORMAL LOW (ref 22–32)

## 2022-01-26 LAB — I-STAT VENOUS BLOOD GAS, ED
Acid-base deficit: 7 mmol/L — ABNORMAL HIGH (ref 0.0–2.0)
Bicarbonate: 19.7 mmol/L — ABNORMAL LOW (ref 20.0–28.0)
Calcium, Ion: 1.27 mmol/L (ref 1.15–1.40)
HCT: 36 % (ref 36.0–46.0)
Hemoglobin: 12.2 g/dL (ref 12.0–15.0)
O2 Saturation: 91 %
Potassium: 3.8 mmol/L (ref 3.5–5.1)
Sodium: 133 mmol/L — ABNORMAL LOW (ref 135–145)
TCO2: 21 mmol/L — ABNORMAL LOW (ref 22–32)
pCO2, Ven: 41.8 mmHg — ABNORMAL LOW (ref 44–60)
pH, Ven: 7.281 (ref 7.25–7.43)
pO2, Ven: 70 mmHg — ABNORMAL HIGH (ref 32–45)

## 2022-01-26 LAB — CBC WITH DIFFERENTIAL/PLATELET
Abs Immature Granulocytes: 0.15 10*3/uL — ABNORMAL HIGH (ref 0.00–0.07)
Basophils Absolute: 0.1 10*3/uL (ref 0.0–0.1)
Basophils Relative: 1 %
Eosinophils Absolute: 0 10*3/uL (ref 0.0–0.5)
Eosinophils Relative: 0 %
HCT: 38 % (ref 36.0–46.0)
Hemoglobin: 12 g/dL (ref 12.0–15.0)
Immature Granulocytes: 1 %
Lymphocytes Relative: 5 %
Lymphs Abs: 0.9 10*3/uL (ref 0.7–4.0)
MCH: 30.5 pg (ref 26.0–34.0)
MCHC: 31.6 g/dL (ref 30.0–36.0)
MCV: 96.4 fL (ref 80.0–100.0)
Monocytes Absolute: 1.1 10*3/uL — ABNORMAL HIGH (ref 0.1–1.0)
Monocytes Relative: 6 %
Neutro Abs: 17.8 10*3/uL — ABNORMAL HIGH (ref 1.7–7.7)
Neutrophils Relative %: 87 %
Platelets: 246 10*3/uL (ref 150–400)
RBC: 3.94 MIL/uL (ref 3.87–5.11)
RDW: 14.1 % (ref 11.5–15.5)
WBC: 20.1 10*3/uL — ABNORMAL HIGH (ref 4.0–10.5)
nRBC: 0 % (ref 0.0–0.2)

## 2022-01-26 LAB — URINALYSIS, ROUTINE W REFLEX MICROSCOPIC
Bilirubin Urine: NEGATIVE
Glucose, UA: 50 mg/dL — AB
Hgb urine dipstick: NEGATIVE
Ketones, ur: NEGATIVE mg/dL
Nitrite: NEGATIVE
Protein, ur: NEGATIVE mg/dL
Specific Gravity, Urine: 1.011 (ref 1.005–1.030)
WBC, UA: >50 WBC/hpf — ABNORMAL HIGH (ref 0–5)
pH: 6 (ref 5.0–8.0)

## 2022-01-26 LAB — I-STAT ARTERIAL BLOOD GAS, ED
Acid-base deficit: 6 mmol/L — ABNORMAL HIGH (ref 0.0–2.0)
Bicarbonate: 20.2 mmol/L (ref 20.0–28.0)
Calcium, Ion: 1.33 mmol/L (ref 1.15–1.40)
HCT: 32 % — ABNORMAL LOW (ref 36.0–46.0)
Hemoglobin: 10.9 g/dL — ABNORMAL LOW (ref 12.0–15.0)
O2 Saturation: 100 %
Potassium: 3.4 mmol/L — ABNORMAL LOW (ref 3.5–5.1)
Sodium: 134 mmol/L — ABNORMAL LOW (ref 135–145)
TCO2: 21 mmol/L — ABNORMAL LOW (ref 22–32)
pCO2 arterial: 39.7 mmHg (ref 32–48)
pH, Arterial: 7.315 — ABNORMAL LOW (ref 7.35–7.45)
pO2, Arterial: 357 mmHg — ABNORMAL HIGH (ref 83–108)

## 2022-01-26 LAB — LACTIC ACID, PLASMA
Lactic Acid, Venous: 2.4 mmol/L (ref 0.5–1.9)
Lactic Acid, Venous: 2.8 mmol/L (ref 0.5–1.9)

## 2022-01-26 LAB — GLUCOSE, CAPILLARY
Glucose-Capillary: 271 mg/dL — ABNORMAL HIGH (ref 70–99)
Glucose-Capillary: 284 mg/dL — ABNORMAL HIGH (ref 70–99)
Glucose-Capillary: 307 mg/dL — ABNORMAL HIGH (ref 70–99)
Glucose-Capillary: 330 mg/dL — ABNORMAL HIGH (ref 70–99)

## 2022-01-26 LAB — CBG MONITORING, ED
Glucose-Capillary: 341 mg/dL — ABNORMAL HIGH (ref 70–99)
Glucose-Capillary: 330 mg/dL — ABNORMAL HIGH (ref 70–99)

## 2022-01-26 LAB — RESP PANEL BY RT-PCR (FLU A&B, COVID) ARPGX2
Influenza A by PCR: NEGATIVE
Influenza B by PCR: NEGATIVE
SARS Coronavirus 2 by RT PCR: NEGATIVE

## 2022-01-26 LAB — MRSA NEXT GEN BY PCR, NASAL: MRSA by PCR Next Gen: NOT DETECTED

## 2022-01-26 LAB — BRAIN NATRIURETIC PEPTIDE: B Natriuretic Peptide: 140.5 pg/mL — ABNORMAL HIGH (ref 0.0–100.0)

## 2022-01-26 LAB — TROPONIN I (HIGH SENSITIVITY): Troponin I (High Sensitivity): 53 ng/L — ABNORMAL HIGH (ref <18)

## 2022-01-26 MED ORDER — PROPOFOL 1000 MG/100ML IV EMUL
INTRAVENOUS | Status: DC | PRN
  Administered 2022-01-26: 10 ug/kg/min via INTRAVENOUS

## 2022-01-26 MED ORDER — LACTATED RINGERS IV BOLUS: 2000.0000 mL | Freq: Once | INTRAVENOUS | Status: DC

## 2022-01-26 MED ORDER — PHENYLEPHRINE 80 MCG/ML (10ML) SYRINGE FOR IV PUSH (FOR BLOOD PRESSURE SUPPORT)
80.0000 ug | PREFILLED_SYRINGE | Freq: Once | INTRAVENOUS | Status: AC | PRN
  Administered 2022-01-26: 160 ug via INTRAVENOUS
  Filled 2022-01-26: qty 10

## 2022-01-26 MED ORDER — LACTATED RINGERS IV BOLUS
1000.0000 mL | Freq: Once | INTRAVENOUS | Status: AC
  Administered 2022-01-26: 1000 mL via INTRAVENOUS

## 2022-01-26 MED ORDER — SODIUM CHLORIDE 0.9 % IV SOLN
2.0000 g | Freq: Three times a day (TID) | INTRAVENOUS | Status: DC
  Administered 2022-01-26 – 2022-01-27 (×2): 2 g via INTRAVENOUS
  Filled 2022-01-26 (×3): qty 12.5

## 2022-01-26 MED ORDER — IOHEXOL 350 MG/ML SOLN
75.0000 mL | Freq: Once | INTRAVENOUS | Status: AC | PRN
  Administered 2022-01-26: 75 mL via INTRAVENOUS

## 2022-01-26 MED ORDER — MIDAZOLAM HCL 2 MG/2ML IJ SOLN: 2.0000 mg | INTRAMUSCULAR | Status: DC | PRN

## 2022-01-26 MED ORDER — VANCOMYCIN HCL 2000 MG/400ML IV SOLN
2000.0000 mg | Freq: Once | INTRAVENOUS | Status: DC
  Administered 2022-01-26: 2000 mg via INTRAVENOUS
  Filled 2022-01-26: qty 400

## 2022-01-26 MED ORDER — ROSUVASTATIN CALCIUM 20 MG PO TABS
20.0000 mg | ORAL_TABLET | Freq: Every day | ORAL | Status: DC
  Administered 2022-01-27: 20 mg via ORAL
  Filled 2022-01-26: qty 1

## 2022-01-26 MED ORDER — SODIUM CHLORIDE 0.9 % IV SOLN
INTRAVENOUS | Status: DC | PRN
  Administered 2022-01-26 (×2): 1000 mL via INTRAVENOUS
  Administered 2022-01-28: 250 mL via INTRAVENOUS

## 2022-01-26 MED ORDER — INSULIN ASPART 100 UNIT/ML IJ SOLN
0.0000 [IU] | INTRAMUSCULAR | Status: DC
  Administered 2022-01-26: 11 [IU] via SUBCUTANEOUS
  Administered 2022-01-27 (×2): 2 [IU] via SUBCUTANEOUS
  Administered 2022-01-27: 3 [IU] via SUBCUTANEOUS
  Administered 2022-01-27 (×2): 5 [IU] via SUBCUTANEOUS
  Administered 2022-01-27: 8 [IU] via SUBCUTANEOUS
  Administered 2022-01-27 – 2022-01-28 (×2): 3 [IU] via SUBCUTANEOUS
  Administered 2022-01-28: 8 [IU] via SUBCUTANEOUS
  Administered 2022-01-28: 5 [IU] via SUBCUTANEOUS
  Administered 2022-01-28: 2 [IU] via SUBCUTANEOUS
  Administered 2022-01-29: 3 [IU] via SUBCUTANEOUS
  Administered 2022-01-29: 11 [IU] via SUBCUTANEOUS
  Administered 2022-01-29 (×2): 3 [IU] via SUBCUTANEOUS
  Administered 2022-01-29: 8 [IU] via SUBCUTANEOUS
  Administered 2022-01-30 (×4): 5 [IU] via SUBCUTANEOUS
  Administered 2022-01-30 (×2): 3 [IU] via SUBCUTANEOUS
  Administered 2022-01-30: 5 [IU] via SUBCUTANEOUS
  Administered 2022-01-31 (×2): 3 [IU] via SUBCUTANEOUS

## 2022-01-26 MED ORDER — POLYETHYLENE GLYCOL 3350 17 G PO PACK
17.0000 g | PACK | Freq: Every day | ORAL | Status: DC | PRN
  Administered 2022-01-30: 17 g via ORAL
  Filled 2022-01-26: qty 1

## 2022-01-26 MED ORDER — "THROMBI-PAD 3""X3"" EX PADS"
1.0000 | MEDICATED_PAD | Freq: Once | CUTANEOUS | Status: AC
  Administered 2022-01-26: 1 via TOPICAL
  Filled 2022-01-26: qty 1

## 2022-01-26 MED ORDER — FENTANYL CITRATE (PF) 100 MCG/2ML IJ SOLN
INTRAMUSCULAR | Status: DC | PRN
  Administered 2022-01-26: 50 ug via INTRAVENOUS

## 2022-01-26 MED ORDER — NOREPINEPHRINE 4 MG/250ML-% IV SOLN
0.0000 ug/min | INTRAVENOUS | Status: DC
  Administered 2022-01-26: 5 ug/min via INTRAVENOUS
  Administered 2022-01-26: 6 ug/min via INTRAVENOUS
  Administered 2022-01-27: 2 ug/min via INTRAVENOUS
  Administered 2022-01-27: 9 ug/min via INTRAVENOUS
  Administered 2022-01-28: 2 ug/min via INTRAVENOUS
  Administered 2022-01-28 – 2022-01-29 (×2): 20 ug/min via INTRAVENOUS
  Filled 2022-01-26 (×6): qty 250

## 2022-01-26 MED ORDER — INSULIN GLARGINE-YFGN 100 UNIT/ML ~~LOC~~ SOLN
15.0000 [IU] | Freq: Every day | SUBCUTANEOUS | Status: DC
  Administered 2022-01-27 – 2022-01-28 (×2): 15 [IU] via SUBCUTANEOUS
  Filled 2022-01-26 (×3): qty 0.15

## 2022-01-26 MED ORDER — ROCURONIUM BROMIDE 50 MG/5ML IV SOLN
100.0000 mg | Freq: Once | INTRAVENOUS | Status: DC
  Filled 2022-01-26: qty 10

## 2022-01-26 MED ORDER — SODIUM CHLORIDE 0.9 % IV SOLN
2.0000 g | Freq: Once | INTRAVENOUS | Status: AC
  Administered 2022-01-26: 2 g via INTRAVENOUS
  Filled 2022-01-26: qty 12.5

## 2022-01-26 MED ORDER — CLOPIDOGREL BISULFATE 75 MG PO TABS: 75.0000 mg | ORAL_TABLET | Freq: Every day | ORAL | Status: DC

## 2022-01-26 MED ORDER — ONDANSETRON HCL 4 MG/2ML IJ SOLN
4.0000 mg | Freq: Four times a day (QID) | INTRAMUSCULAR | Status: DC | PRN
  Administered 2022-01-28: 4 mg via INTRAVENOUS

## 2022-01-26 MED ORDER — ORAL CARE MOUTH RINSE: 15.0000 mL | OROMUCOSAL | Status: DC | PRN

## 2022-01-26 MED ORDER — ACETAMINOPHEN 325 MG PO TABS
650.0000 mg | ORAL_TABLET | ORAL | Status: DC | PRN
  Administered 2022-01-30: 650 mg
  Filled 2022-01-26 (×2): qty 2

## 2022-01-26 MED ORDER — PANTOPRAZOLE 2 MG/ML SUSPENSION: 40.0000 mg | Freq: Every day | ORAL | Status: DC

## 2022-01-26 MED ORDER — FENTANYL CITRATE PF 50 MCG/ML IJ SOSY
PREFILLED_SYRINGE | INTRAMUSCULAR | Status: AC
  Filled 2022-01-26: qty 1

## 2022-01-26 MED ORDER — INSULIN ASPART 100 UNIT/ML IJ SOLN: 0.0000 [IU] | INTRAMUSCULAR | Status: DC

## 2022-01-26 MED ORDER — FENTANYL BOLUS VIA INFUSION
25.0000 ug | INTRAVENOUS | Status: DC | PRN
  Administered 2022-01-26: 25 ug via INTRAVENOUS

## 2022-01-26 MED ORDER — PROPOFOL 1000 MG/100ML IV EMUL: 0.0000 ug/kg/min | INTRAVENOUS | Status: DC

## 2022-01-26 MED ORDER — ENOXAPARIN SODIUM 100 MG/ML IJ SOSY
100.0000 mg | PREFILLED_SYRINGE | Freq: Two times a day (BID) | INTRAMUSCULAR | Status: DC
  Filled 2022-01-26: qty 1

## 2022-01-26 MED ORDER — NOREPINEPHRINE 4 MG/250ML-% IV SOLN
INTRAVENOUS | Status: AC
  Administered 2022-01-26: 4 mg
  Filled 2022-01-26: qty 250

## 2022-01-26 MED ORDER — PHENYLEPHRINE 80 MCG/ML (10ML) SYRINGE FOR IV PUSH (FOR BLOOD PRESSURE SUPPORT)
80.0000 ug | PREFILLED_SYRINGE | Freq: Once | INTRAVENOUS | Status: AC
  Administered 2022-01-26: 80 ug via INTRAVENOUS

## 2022-01-26 MED ORDER — CHLORHEXIDINE GLUCONATE 0.12 % MT SOLN
15.0000 mL | Freq: Once | OROMUCOSAL | Status: AC
  Administered 2022-01-26: 15 mL via OROMUCOSAL
  Filled 2022-01-26: qty 15

## 2022-01-26 MED ORDER — LACTATED RINGERS IV BOLUS: 1000.0000 mL | Freq: Once | INTRAVENOUS | Status: DC

## 2022-01-26 MED ORDER — ETOMIDATE 2 MG/ML IV SOLN
INTRAVENOUS | Status: DC | PRN
  Administered 2022-01-26: 20 mg via INTRAVENOUS

## 2022-01-26 MED ORDER — ROCURONIUM BROMIDE 10 MG/ML (PF) SYRINGE
PREFILLED_SYRINGE | INTRAVENOUS | Status: AC
  Filled 2022-01-26: qty 10

## 2022-01-26 MED ORDER — HYDROCORTISONE SOD SUC (PF) 100 MG IJ SOLR
100.0000 mg | Freq: Three times a day (TID) | INTRAMUSCULAR | Status: DC
  Administered 2022-01-26 – 2022-01-28 (×6): 100 mg via INTRAVENOUS
  Filled 2022-01-26 (×6): qty 2

## 2022-01-26 MED ORDER — ETOMIDATE 2 MG/ML IV SOLN: 20.0000 mg | Freq: Once | INTRAVENOUS | Status: DC

## 2022-01-26 MED ORDER — ORAL CARE MOUTH RINSE
15.0000 mL | OROMUCOSAL | Status: DC
  Administered 2022-01-26 – 2022-01-31 (×57): 15 mL via OROMUCOSAL

## 2022-01-26 MED ORDER — PANTOPRAZOLE 2 MG/ML SUSPENSION
40.0000 mg | Freq: Every day | ORAL | Status: DC
  Administered 2022-01-26 – 2022-02-02 (×6): 40 mg
  Filled 2022-01-26 (×12): qty 20

## 2022-01-26 MED ORDER — ENOXAPARIN SODIUM 120 MG/0.8ML IJ SOSY
1.0000 mg/kg | PREFILLED_SYRINGE | Freq: Two times a day (BID) | INTRAMUSCULAR | Status: DC
  Administered 2022-01-27 (×2): 105 mg via SUBCUTANEOUS
  Filled 2022-01-26 (×2): qty 0.7

## 2022-01-26 MED ORDER — DOCUSATE SODIUM 100 MG PO CAPS: 100.0000 mg | ORAL_CAPSULE | Freq: Two times a day (BID) | ORAL | Status: DC | PRN

## 2022-01-26 MED ORDER — ENOXAPARIN SODIUM 100 MG/ML IJ SOSY
100.0000 mg | PREFILLED_SYRINGE | Freq: Once | INTRAMUSCULAR | Status: AC
  Administered 2022-01-26: 100 mg via SUBCUTANEOUS
  Filled 2022-01-26: qty 1

## 2022-01-26 MED ORDER — ROCURONIUM BROMIDE 50 MG/5ML IV SOLN
INTRAVENOUS | Status: DC | PRN
  Administered 2022-01-26: 100 mg via INTRAVENOUS

## 2022-01-26 MED ORDER — ETOMIDATE 2 MG/ML IV SOLN
INTRAVENOUS | Status: AC
  Filled 2022-01-26: qty 10

## 2022-01-26 MED ORDER — FENTANYL 2500MCG IN NS 250ML (10MCG/ML) PREMIX INFUSION
25.0000 ug/h | INTRAVENOUS | Status: DC
  Administered 2022-01-26: 25 ug/h via INTRAVENOUS
  Administered 2022-01-26: 50 ug/h via INTRAVENOUS
  Administered 2022-01-27: 100 ug/h via INTRAVENOUS
  Filled 2022-01-26 (×3): qty 250

## 2022-01-26 MED ORDER — CLOPIDOGREL BISULFATE 75 MG PO TABS
75.0000 mg | ORAL_TABLET | Freq: Every day | ORAL | Status: DC
  Filled 2022-01-26: qty 1

## 2022-01-26 NOTE — Progress Notes (Signed)
Post intubation ABG results obtained on ventilator settings of VT: 410, RR: 28, FIO2: 100%, and PEEP: 5.0.  Will decrease FIO2 to 40%.  Results given to MD.  No further vent changes at this time.  Will continue to monitor.    Latest Reference Range & Units 01/26/22 10:43  Sample type  ARTERIAL  pH, Arterial 7.35 - 7.45  7.315 (L)  pCO2 arterial 32 - 48 mmHg 39.7  pO2, Arterial 83 - 108 mmHg 357 (H)  TCO2 22 - 32 mmol/L 21 (L)  Acid-base deficit 0.0 - 2.0 mmol/L 6.0 (H)  Bicarbonate 20.0 - 28.0 mmol/L 20.2  O2 Saturation % 100  Collection site  art line

## 2022-01-26 NOTE — Progress Notes (Signed)
Patient transported from CT back to ED room 26, on ventilator, without complications.

## 2022-01-26 NOTE — Progress Notes (Signed)
Patient transported from ED to room 2M10, on ventilator, without complications.

## 2022-01-26 NOTE — H&P (Addendum)
NAME:  Cassandra Dodson, MRN:  [id number], DOB:  August 15, 1940, LOS: 0 ADMISSION DATE:  01/26/2022, CONSULTATION DATE:  01/26/22 REFERRING MD:  Dr. 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  History per son.  This morning patient had sudden change in mental status.  Slumped over.  EMS was called.  Sats were in the 80s and tachypneic.  Placed on nonrebreather.  Felt to be warm.  Tachycardia to 140s.  On arrival reportedly breathing in the 65s with increased work of breathing.  This prompted intubation.  Chest x-ray both pre and postintubation revealed her lungs bilaterally with evidence of volume loss versus right hemidiaphragm dysfunction on my review and interpretation.  CBC notable for leukocytosis.  Urinalysis with positive leukocyte esterase and pyuria.  CMP pending on i-STAT creatinine 0.7, close to baseline hyperglycemic over 300 with mild pseudo hyponatremia.  CT head imaging pending.  CT chest abdomen pelvis for dissection pending.  Discharge from hospital 01/09/2022 with encephalopathy.  Meningioma appears stable with some mild vasogenic edema.  Was placed on dexamethasone but does not like this was continued on discharge.  She completed a course of treatment for UTI.  No organism identified.  Her mental status improved with treatment of UTI.  Pertinent  Medical History  Diabetes, meningioma, multiple CVAs  Significant Hospital Events: Including procedures, antibiotic start and stop dates in addition to other pertinent events   01/26/2022 brought to the ED with altered mental  status, tachypnea, hypoxemia concerning for sepsis with septic shock  Interim History / Subjective:  As above  Objective   Blood pressure 93/65, pulse (!) 132, temperature 97.8 F (36.6 C), resp. rate (!) 28, height '5\' 3"'$  (1.6 m), SpO2 100 %.    Vent Mode: PRVC FiO2 (%):  [100 %] 100 % Set Rate:  [28 bmp] 28 bmp Vt Set:  [410 mL] 410 mL PEEP:  [5 cmH20] 5 cmH20 Plateau Pressure:  [14 cmH20-17 cmH20] 14 cmH20  No intake or output data in the 24 hours ending 01/26/22 1212 There were no vitals filed for this visit.  Examination: General: Elderly, frail, lying in bed HENT: Pupils equal round reactive to light, moist mucous membranes Lungs: Coarse ventilated sounds but clear otherwise Cardiovascular: Tachycardic, regular, warm Abdomen: Nondistended and bowel sounds present Extremities: No edema Neuro: Deeply sedated, unable to get reliable neurologic exam   Resolved Hospital Problem list     Assessment & Plan:  Toxic metabolic encephalopathy: Unclear etiology but favor related to PE.  Urinalysis appears consistent with UTI.  Possible severe sepsis with encephalopathy.   -- Head imaging with CT head to start -- fentanyl gtt, midaz PRN -- propofol d/c'd given PE, preserve RV preload  Acute hypoxemic respiratory due to submassive vs massive PE: Hypotensive prior to intubation. Reportedly 80% on room air in the field.  Now intubated with tachypnea.   -- lovenox 1 mg / kg BID -- IR consult, does not recommend catheter directed lytics -- Meningioma, recent CVA contraindicates systemic lytics -- STAT TTE -- PRVC, wean FiO2 and PEEP as able -- serial Blood gases --  VAP bundle, stress ulcer prophylaxis  Shock: septic vs obstructive with PE --NE, MAP > 65 --stress dose steroids given recent decadron course  Severe sepsis with due to UTI: With dirty UA, leukocyte esterase and pyuria. -- Cefepime for now, vancomycin given in the ED and d/c'd, notably she had ESBL Klebsiella but in  2021, consider addition of meropenem without improving (also history of Pseudomonas in the past) -- Continue stress dose steroids given recent course of dexamethasone -- 1 additional liters LR to the 2 L already ordered to achieve 30 cc/kg -- Trend lactate  Meningioma: Seen by neurosurgery recently.  No intervention.  Repeat imaging as above.  Diabetes with hyperglycemia: Suspect stress response. -- sliding scale insulin, resume home glargine  Best Practice (right click and "Reselect all SmartList Selections" daily)   Diet/type: NPO DVT prophylaxis: systemic dose LMWH GI prophylaxis: PPI Lines: Central line, Arterial Line, and yes and it is still needed Foley:  Yes, and it is still needed Code Status:  full code Last date of multidisciplinary goals of care discussion [updated son at bedside in ED 10/7]  Labs   CBC: Recent Labs  Lab 01/26/22 1043 01/26/22 1147 01/26/22 1151 01/26/22 1152  WBC  --  20.1*  --   --   NEUTROABS  --  17.8*  --   --   HGB 10.9* 12.0 12.6 12.2  HCT 32.0* 38.0 37.0 36.0  MCV  --  96.4  --   --   PLT  --  246  --   --     Basic Metabolic Panel: Recent Labs  Lab 01/26/22 1043 01/26/22 1151 01/26/22 1152  NA 134* 133* 133*  K 3.4* 3.8 3.8  CL  --  102  --   GLUCOSE  --  323*  --   BUN  --  17  --   CREATININE  --  0.70  --    GFR: CrCl cannot be calculated (Unknown ideal weight.). Recent Labs  Lab 01/26/22 1147  WBC 20.1*    Liver Function Tests: No results for input(s): "AST", "ALT", "ALKPHOS", "BILITOT", "PROT", "ALBUMIN" in the last 168 hours. No results for input(s): "LIPASE", "AMYLASE" in the last 168 hours. No results for input(s): "AMMONIA" in the last 168 hours.  ABG    Component Value Date/Time   PHART 7.315 (L) 01/26/2022 1043   PCO2ART 39.7 01/26/2022 1043   PO2ART 357 (H) 01/26/2022 1043   HCO3 19.7 (L) 01/26/2022 1152   TCO2 21 (L) 01/26/2022 1152   ACIDBASEDEF 7.0 (H) 01/26/2022 1152   O2SAT 91 01/26/2022 1152      Coagulation Profile: No results for input(s): "INR", "PROTIME" in the last 168 hours.  Cardiac Enzymes: No results for input(s): "CKTOTAL", "CKMB", "CKMBINDEX", "TROPONINI" in the last 168 hours.  HbA1C: Hemoglobin A1C  Date/Time Value Ref Range Status  01/23/2015 12:00 AM 7.6  Final   Hgb A1c MFr Bld  Date/Time Value Ref Range Status  01/16/2022 08:52 AM 6.0 (H) 4.8 - 5.6 % Final    Comment:    (NOTE) Pre diabetes:          5.7%-6.4%  Diabetes:              >6.4%  Glycemic control for   <7.0% adults with diabetes   10/26/2021 02:25 AM 7.0 (H) 4.8 - 5.6 % Final    Comment:    (NOTE) Pre diabetes:          5.7%-6.4%  Diabetes:              >  6.4%  Glycemic control for   <7.0% adults with diabetes     CBG: Recent Labs  Lab 01/26/22 0917 01/26/22 1155  GLUCAP 341* 330*    Review of Systems:   Unobtainable due to patient factors, encephalopathy, intubated and sedated  Past Medical History:  She,  has a past medical history of Arthritis, CKD (chronic kidney disease), stage III ([place]), Depression, Diabetes mellitus without complication ([place]), Difficult intravenous access, Endometrial cancer ([place]), GERD (gastroesophageal reflux disease), Headache, History of radiation therapy (11/04/2019-12/01/2019), Hypertension, Hypothyroidism, Neuromuscular disorder ([place]), Osteoarthritis, PMB (postmenopausal bleeding), PONV (postoperative nausea and vomiting), Urinary frequency, and Wears glasses.   Surgical History:   Past Surgical History:  Procedure Laterality Date   BREAST SURGERY     cyst removed   CESAREAN SECTION     DILATION AND CURETTAGE OF UTERUS N/A 06/24/2019   Procedure: DILATATION AND CURETTAGE;  Surgeon: Lafonda Mosses, MD;  Location: [facility];  Service: Gynecology;  Laterality: N/A;   FRACTURE SURGERY     right knee   INTRAUTERINE DEVICE (IUD) INSERTION N/A 06/24/2019   Procedure: INTRAUTERINE DEVICE (IUD) INSERTION MIRENA;  Surgeon:  Lafonda Mosses, MD;  Location: [facility];  Service: Gynecology;  Laterality: N/A;   IR ANGIO INTRA EXTRACRAN SEL COM CAROTID INNOMINATE UNI R MOD SED  11/05/2021   IR ANGIO VERTEBRAL SEL VERTEBRAL UNI R MOD SED  11/05/2021   IR CT HEAD LTD  11/01/2021   IR INTRA CRAN STENT  11/01/2021   IR RADIOLOGIST EVAL & MGMT  12/19/2021   IR US GUIDE VASC ACCESS RIGHT  11/01/2021   IRRIGATION AND DEBRIDEMENT SEBACEOUS CYST     JOINT REPLACEMENT Right    right   LYMPH NODE DISSECTION N/A 09/14/2019   Procedure: LYMPH NODE DISSECTION;  Surgeon: Lafonda Mosses, MD;  Location: [facility];  Service: Gynecology;  Laterality: N/A;   RADIOLOGY WITH ANESTHESIA N/A 11/01/2021   Procedure: ANGIOPLASTY/STENT;  Surgeon: Luanne Bras, MD;  Location: [facility];  Service: Radiology;  Laterality: N/A;   ROBOTIC ASSISTED TOTAL HYSTERECTOMY WITH BILATERAL SALPINGO OOPHERECTOMY Bilateral 09/14/2019   Procedure: XI ROBOTIC ASSISTED TOTAL HYSTERECTOMY WITH BILATERAL SALPINGO OOPHORECTOMY;  Surgeon: Lafonda Mosses, MD;  Location: [facility];  Service: Gynecology;  Laterality: Bilateral;   SENTINEL NODE BIOPSY N/A 09/14/2019   Procedure: SENTINEL NODE BIOPSY;  Surgeon: Lafonda Mosses, MD;  Location: [facility];  Service: Gynecology;  Laterality: N/A;   teeth extration     TONSILLECTOMY     TOTAL HIP ARTHROPLASTY Right 02/23/2015   Procedure: RIGHT TOTAL HIP ARTHROPLASTY ANTERIOR APPROACH;  Surgeon: Leandrew Koyanagi, MD;  Location: [facility];  Service: Orthopedics;  Laterality: Right;   TOTAL KNEE ARTHROPLASTY Left 06/27/2016   Procedure: LEFT TOTAL KNEE ARTHROPLASTY;  Surgeon: Leandrew Koyanagi, MD;  Location: [facility];  Service: Orthopedics;  Laterality: Left;     Social History:   reports that she quit smoking about 19 years ago. Her smoking use included cigarettes. She has a 67.50 pack-year smoking history. She has never used smokeless tobacco. She reports current alcohol use. She reports that she does not use drugs.    Family History:  Her family history includes Hypertension in her father; Pancreatic cancer in her mother; Stroke in her father. There is no history of Colon cancer, Breast cancer, Ovarian cancer, Endometrial cancer, or Prostate cancer.   Allergies Allergies  Allergen Reactions   Anesthetics, Amide Other (See Comments)    Pt is intolerant  to general anesthesia. Pt will throw up and has thrown up during procedure.    Ether Nausea And Vomiting     Home Medications  Prior to Admission medications   Medication Sig Start Date End Date Taking? Authorizing Provider  amLODipine (NORVASC) 5 MG tablet Take 1 tablet (5 mg total) by mouth daily. 01/18/22 02/17/22 Yes Pahwani, Einar Grad, MD  aspirin EC 81 MG tablet Take 1 tablet (81 mg total) by mouth daily for 16 days. Swallow whole. 01/18/22 02/03/22 Yes Pahwani, Einar Grad, MD  calcium carbonate (OS-CAL - DOSED IN MG OF ELEMENTAL CALCIUM) 1250 (500 Ca) MG tablet Take 1 tablet (1,250 mg total) by mouth daily with breakfast. 11/28/21  Yes Love, Ivan Anchors, PA-C  clopidogrel (PLAVIX) 75 MG tablet Take 1 tablet (75 mg total) by mouth daily. 11/27/21  Yes Love, Ivan Anchors, PA-C  docusate sodium (COLACE) 100 MG capsule Take 1 capsule (100 mg total) by mouth 2 (two) times daily. 11/27/21  Yes Love, Ivan Anchors, PA-C  fenofibrate 160 MG tablet Take 1 tablet (160 mg total) by mouth daily after breakfast. 11/27/21  Yes Love, Ivan Anchors, PA-C  folic acid (FOLVITE) 1 MG tablet Take 1 tablet (1 mg total) by mouth daily. 11/27/21  Yes Love, Ivan Anchors, PA-C  insulin glargine (LANTUS) 100 UNIT/ML Solostar Pen Inject 15 Units into the skin daily. 01/17/22  Yes Pahwani, Einar Grad, MD  insulin lispro (HUMALOG) 100 UNIT/ML KwikPen Inject 1 Units into the skin 3 (three) times daily with meals. 11/27/21  Yes Love, Ivan Anchors, PA-C  methylphenidate (RITALIN) 5 MG tablet Take 1 tablet (5 mg total) by mouth 2 (two) times daily with breakfast and lunch. 11/27/21  Yes Love, Ivan Anchors, PA-C  Mouthwashes (MOUTH RINSE) LIQD  solution 15 mLs by Mouth Rinse route as needed (for oral care). 11/05/21  Yes Sheikh, Omair Latif, DO  pantoprazole (PROTONIX) 40 MG tablet Take 1 tablet (40 mg total) by mouth daily. 11/27/21  Yes Love, Ivan Anchors, PA-C  rosuvastatin (CRESTOR) 20 MG tablet Take 1 tablet (20 mg total) by mouth daily. 11/27/21  Yes Love, Ivan Anchors, PA-C  senna (SENOKOT) 8.6 MG TABS tablet Take 13.2 mg by mouth at bedtime as needed for constipation. 12/18/21  Yes [provider]  traMADol (ULTRAM) 50 MG tablet Take 50 mg by mouth daily as needed for pain. 01/09/22  Yes [provider]  Zinc Oxide (DESITIN) 13 % CREA Apply 1 application  topically 2 (two) times daily. To buttocks for protection. 11/27/21  Yes Love, Ivan Anchors, PA-C  Insulin Pen Needle 32G X 4 MM MISC Use daily in the morning, at noon, and at bedtime. 11/27/21   Bary Leriche, PA-C     Critical care time:     CRITICAL CARE Performed by: Lanier Clam   Total critical care time: 60 minutes  Critical care time was exclusive of separately billable procedures and treating other patients.  Critical care was necessary to treat or prevent imminent or life-threatening deterioration.  Critical care was time spent personally by me on the following activities: development of treatment plan with patient and/or surrogate as well as nursing, discussions with consultants, evaluation of patient's response to treatment, examination of patient, obtaining history from patient or surrogate, ordering and performing treatments and interventions, ordering and review of laboratory studies, ordering and review of radiographic studies, pulse oximetry and re-evaluation of patient's condition.   Lanier Clam, MD See Shea Evans for contact info

## 2022-01-26 NOTE — Progress Notes (Signed)
IR consulted regarding Cassandra Dodson and PE for possible catheter directed lytics.  Case discussed between Dr. Silas Flood and IR Attending Dr. Annamaria Boots.  Given patient age and that she has a large meningioma, she is at high risk for ICH with lytic therapy and cannot recommend proceeding with intervention at this time.    IR remains available for reconsult as needed.  Electronically Signed: Pasty Spillers, PA-C 01/26/2022, 1:46 PM

## 2022-01-26 NOTE — ED Provider Notes (Signed)
[facility] EMERGENCY DEPARTMENT Provider Note   CSN: [id number] Arrival date & time: 01/26/22  8099     History  Chief Complaint  Patient presents with   Code Sepsis    Cassandra Dodson is a 81 y.o. female.  With past medical history of CKD stage III, type 2 diabetes, meningioma, CVA, recent UTI who presents to the emergency department as a code sepsis.  LEVEL 5 CAVEAT: AMS   Patient arrived via EMS who provided initial information. Reports that patient's son was caring for her this morning. She had a sudden change in mental status and EMS was called. On EMS arrival pt O2 sat in the 80's, tachypneic and was placed on non-rebreather. They also noted her to be hot to touch. Was tachycardic to 140's en route.   Son arrives shortly after patient and endorses same story. Also states she has history of meningioma, recently taken off of steroids. States she was just discharged from hospital for UTI, AMS, sepsis.   HPI     Home Medications Prior to Admission medications   Medication Sig Start Date End Date Taking? Authorizing Provider  amLODipine (NORVASC) 5 MG tablet Take 1 tablet (5 mg total) by mouth daily. 01/18/22 02/17/22 Yes Pahwani, Einar Grad, MD  aspirin EC 81 MG tablet Take 1 tablet (81 mg total) by mouth daily for 16 days. Swallow whole. 01/18/22 02/03/22 Yes Pahwani, Einar Grad, MD  calcium carbonate (OS-CAL - DOSED IN MG OF ELEMENTAL CALCIUM) 1250 (500 Ca) MG tablet Take 1 tablet (1,250 mg total) by mouth daily with breakfast. 11/28/21  Yes Love, Ivan Anchors, PA-C  clopidogrel (PLAVIX) 75 MG tablet Take 1 tablet (75 mg total) by mouth daily. 11/27/21  Yes Love, Ivan Anchors, PA-C  docusate sodium (COLACE) 100 MG capsule Take 1 capsule (100 mg total) by mouth 2 (two) times daily. 11/27/21  Yes Love, Ivan Anchors, PA-C  fenofibrate 160 MG tablet Take 1 tablet (160 mg total) by mouth daily after breakfast. 11/27/21  Yes Love, Ivan Anchors, PA-C  folic acid (FOLVITE) 1 MG tablet Take 1 tablet (1 mg  total) by mouth daily. 11/27/21  Yes Love, Ivan Anchors, PA-C  insulin glargine (LANTUS) 100 UNIT/ML Solostar Pen Inject 15 Units into the skin daily. 01/17/22  Yes Pahwani, Einar Grad, MD  insulin lispro (HUMALOG) 100 UNIT/ML KwikPen Inject 1 Units into the skin 3 (three) times daily with meals. 11/27/21  Yes Love, Ivan Anchors, PA-C  methylphenidate (RITALIN) 5 MG tablet Take 1 tablet (5 mg total) by mouth 2 (two) times daily with breakfast and lunch. 11/27/21  Yes Love, Ivan Anchors, PA-C  Mouthwashes (MOUTH RINSE) LIQD solution 15 mLs by Mouth Rinse route as needed (for oral care). 11/05/21  Yes Sheikh, Omair Latif, DO  pantoprazole (PROTONIX) 40 MG tablet Take 1 tablet (40 mg total) by mouth daily. 11/27/21  Yes Love, Ivan Anchors, PA-C  rosuvastatin (CRESTOR) 20 MG tablet Take 1 tablet (20 mg total) by mouth daily. 11/27/21  Yes Love, Ivan Anchors, PA-C  senna (SENOKOT) 8.6 MG TABS tablet Take 13.2 mg by mouth at bedtime as needed for constipation. 12/18/21  Yes [provider]  traMADol (ULTRAM) 50 MG tablet Take 50 mg by mouth daily as needed for pain. 01/09/22  Yes [provider]  Zinc Oxide (DESITIN) 13 % CREA Apply 1 application  topically 2 (two) times daily. To buttocks for protection. 11/27/21  Yes Love, Ivan Anchors, PA-C  Insulin Pen Needle 32G X 4 MM MISC Use  daily in the morning, at noon, and at bedtime. 11/27/21   Love, Ivan Anchors, PA-C      Allergies    Anesthetics, amide and Ether    Review of Systems   Review of Systems  Unable to perform ROS: Acuity of condition    Physical Exam Updated Vital Signs BP 93/65   Pulse (!) 132   Temp 97.8 F (36.6 C)   Resp (!) 28   Ht _0  (1.6 m)   Wt 104.1 kg   SpO2 100%   BMI 40.65 kg/m  Physical Exam Vitals and nursing note reviewed.  Constitutional:      General: She is in acute distress.     Appearance: She is ill-appearing and toxic-appearing.  HENT:     Head: Normocephalic.  Eyes:     General: No scleral icterus.    Pupils:     Right eye:  Pupil is sluggish.     Left eye: Pupil is sluggish.  Cardiovascular:     Rate and Rhythm: Regular rhythm. Tachycardia present.     Pulses:          Radial pulses are 1+ on the right side and 1+ on the left side.       Dorsalis pedis pulses are 1+ on the right side and 1+ on the left side.     Heart sounds: No murmur heard. Pulmonary:     Effort: Tachypnea, accessory muscle usage and respiratory distress present.     Breath sounds: Rales present.  Abdominal:     General: Abdomen is protuberant. Bowel sounds are decreased.     Palpations: Abdomen is soft.     Tenderness: There is no guarding.  Musculoskeletal:     Right lower leg: No edema.     Left lower leg: No edema.  Skin:    Capillary Refill: Capillary refill takes less than 2 seconds.  Neurological:     GCS: GCS eye subscore is 2. GCS verbal subscore is 4. GCS motor subscore is 4.     Cranial Nerves: No facial asymmetry.     Sensory: Sensation is intact.     Motor: Weakness present.     ED Results / Procedures / Treatments   Labs (all labs ordered are listed, but only abnormal results are displayed) Labs Reviewed  COMPREHENSIVE METABOLIC PANEL - Abnormal; Notable for the following components:      Result Value   Sodium 133 (*)    CO2 19 (*)    Glucose, Bld 321 (*)    Total Protein 5.9 (*)    Albumin 2.7 (*)    All other components within normal limits  LACTIC ACID, PLASMA - Abnormal; Notable for the following components:   Lactic Acid, Venous 2.4 (*)    All other components within normal limits  CBC WITH DIFFERENTIAL/PLATELET - Abnormal; Notable for the following components:   WBC 20.1 (*)    Neutro Abs 17.8 (*)    Monocytes Absolute 1.1 (*)    Abs Immature Granulocytes 0.15 (*)    All other components within normal limits  URINALYSIS, ROUTINE W REFLEX MICROSCOPIC - Abnormal; Notable for the following components:   APPearance CLOUDY (*)    Glucose, UA 50 (*)    Leukocytes,Ua LARGE (*)    WBC, UA >50 (*)     Bacteria, UA RARE (*)    All other components within normal limits  I-STAT ARTERIAL BLOOD GAS, ED - Abnormal; Notable for the following components:  pH, Arterial 7.315 (*)    pO2, Arterial 357 (*)    TCO2 21 (*)    Acid-base deficit 6.0 (*)    Sodium 134 (*)    Potassium 3.4 (*)    HCT 32.0 (*)    Hemoglobin 10.9 (*)    All other components within normal limits  CBG MONITORING, ED - Abnormal; Notable for the following components:   Glucose-Capillary 341 (*)    All other components within normal limits  I-STAT VENOUS BLOOD GAS, ED - Abnormal; Notable for the following components:   pCO2, Ven 41.8 (*)    pO2, Ven 70 (*)    Bicarbonate 19.7 (*)    TCO2 21 (*)    Acid-base deficit 7.0 (*)    Sodium 133 (*)    All other components within normal limits  I-STAT CHEM 8, ED - Abnormal; Notable for the following components:   Sodium 133 (*)    Glucose, Bld 323 (*)    TCO2 21 (*)    All other components within normal limits  CBG MONITORING, ED - Abnormal; Notable for the following components:   Glucose-Capillary 330 (*)    All other components within normal limits  TROPONIN I (HIGH SENSITIVITY) - Abnormal; Notable for the following components:   Troponin I (High Sensitivity) 53 (*)    All other components within normal limits  CULTURE, BLOOD (ROUTINE X 2)  CULTURE, BLOOD (ROUTINE X 2)  RESP PANEL BY RT-PCR (FLU A&B, COVID) ARPGX2  URINE CULTURE  LACTIC ACID, PLASMA  BRAIN NATRIURETIC PEPTIDE    EKG None  Radiology CT Head Wo Contrast  Result Date: 01/26/2022 CLINICAL DATA:  81 year old female with altered mental status, code sepsis, tachycardia, tachypnea, hyperglycemia. None meningioma. EXAM: CT HEAD WITHOUT CONTRAST TECHNIQUE: Contiguous axial images were obtained from the base of the skull through the vertex without intravenous contrast. RADIATION DOSE REDUCTION: This exam was performed according to the departmental dose-optimization program which includes automated exposure  control, adjustment of the mA and/or kV according to patient size and/or use of iterative reconstruction technique. COMPARISON:  Brain MRI 01/13/2022.  Head CT 01/16/2022. 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 Regional mass effect is stable. Basilar cisterns remain patent. Streak artifact through the superior hemispheres today. No acute intracranial hemorrhage identified. No cortically based acute infarct identified. Fairly extensive chronic ischemic disease in the brainstem, cerebellum, left thalamus. Vascular: Extensive Calcified atherosclerosis at the skull base. Chronic basilar artery vascular stent. No suspicious intracranial vascular hyperdensity. Skull: No acute osseous abnormality identified. Sinuses/Orbits: Visualized paranasal sinuses and mastoids are stable and well aerated. Other: Layering fluid in the visible pharynx. The patient is intubated on contemporary CTA chest. No acute orbit or scalp soft tissue finding. IMPRESSION: 1. Stable non contrast CT appearance of the brain, with large chronic left planum sphenoidale meningioma with associated regional edema and mass effect is stable from last month. 2. Advanced chronic ischemic disease.  Chronic Basilar Artery stent. 3.  No new intracranial abnormality. Electronically Signed   By: Genevie Ann M.D.   On: 01/26/2022 13:00   CT Angio Chest/Abd/Pel for Dissection W and/or Wo Contrast  Result Date: 01/26/2022 CLINICAL DATA:  81 year old female with altered mental status, code sepsis, tachycardia, tachypnea, hyperglycemia. EXAM: CT ANGIOGRAPHY CHEST, ABDOMEN AND PELVIS TECHNIQUE: Non-contrast CT of the chest was initially obtained. Multidetector CT imaging through the chest, abdomen and pelvis was  performed using the standard protocol during bolus administration of intravenous contrast. Multiplanar reconstructed images and  MIPs were obtained and reviewed to evaluate the vascular anatomy. RADIATION DOSE REDUCTION: This exam was performed according to the departmental dose-optimization program which includes automated exposure control, adjustment of the mA and/or kV according to patient size and/or use of iterative reconstruction technique. CONTRAST:  64m OMNIPAQUE IOHEXOL 350 MG/ML SOLN COMPARISON:  No prior chest CTA.  CT Abdomen and Pelvis 03/30/2019. FINDINGS: CTA CHEST FINDINGS Cardiovascular: Pulmonary artery saddle embolus. Bilateral lobar pulmonary artery thrombus. Abnormal RV LV ratio 1.5. Calcified aortic atherosclerosis. Calcified coronary artery atherosclerosis. No cardiomegaly or pericardial effusion. Mediastinum/Nodes: Negative. Enteric tube courses through the esophagus. Lungs/Pleura: Intubated. Endotracheal tube tip in good position above the carina. Major airways remain patent. Mild dependent opacity in the lungs is greater on the right and is enhancing more suggestive of atelectasis than pulmonary infarct. No pleural effusion. Musculoskeletal: No acute osseous abnormality identified. Review of the MIP images confirms the above findings. CTA ABDOMEN AND PELVIS FINDINGS VASCULAR Reflux of contrast to the hepatic IVC, hepatic veins, and also into the infrarenal IVC and pelvic veins. Extensive Aortoiliac calcified atherosclerosis. Negative for aortic dissection or aneurysm. Major arterial structures in the abdomen and pelvis remain patent. There is a left femoral venous catheter in place. Review of the MIP images confirms the above findings. NON-VASCULAR Hepatobiliary: Contrast reflux into the liver which otherwise appears negative. Negative gallbladder. Pancreas: Partially atrophied. Spleen: Negative. Adrenals/Urinary Tract: Adrenal glands and kidneys appears stable and negative. Foley catheter is in place within the bladder, which is mostly obscured by pelvis hip arthroplasty artifact. Stomach/Bowel: Rectosigmoid  retained stool. No dilated large or small bowel. Widespread large bowel diverticulosis. No free air or free fluid identified. Negative appendix visible on series 6, image 24. No convincing bowel inflammation. Enteric tube terminates in the distal gastric body. Lymphatic: No lymphadenopathy identified. Reproductive: Mostly obscured by hip arthroplasty streak artifact. Other: No pelvic free fluid. Musculoskeletal: Advanced lumbar spine degeneration. Chronic right hip arthroplasty. No acute osseous abnormality identified. Review of the MIP images confirms the above findings. Study discussed by telephone with Dr. KMatilde Sprangon 01/26/2022 at 12:48 . IMPRESSION: 1. Positive for Acute PE with Saddle Embolus and Right Heart Strain (RV/LV Ratio = 1.5) consistent with at least Submassive (intermediate risk) PE. The presence of right heart strain has been associated with an increased risk of morbidity and mortality. Please refer to the "Code PE Focused" order set in EPIC. Critical Value/emergent results were called by telephone at the time of interpretation on 01/26/2022 at at 1248 hours to Dr. KMatilde Sprang who verbally acknowledged these results. 2. No pleural effusion, and mild dependent lung opacity is more suggestive of atelectasis than pulmonary infarct. 3. No acute or inflammatory process identified in the abdomen or pelvis. Widespread large bowel diverticulosis without evidence of acute diverticulitis. 4. Satisfactory ET tube, enteric tube, Foley catheter placement. 5. Aortic Atherosclerosis (ICD10-I70.0). Electronically Signed   By: HGenevie AnnM.D.   On: 01/26/2022 12:54   DG Chest Port 1 View  Result Date: 01/26/2022 CLINICAL DATA:  Intubation. EXAM: PORTABLE CHEST 1 VIEW COMPARISON:  01/26/2022 FINDINGS: An endotracheal tube with tip 4 cm above the carina, and enteric tube entering the stomach with tip off the field of view noted. Defibrillator/pacing pad overlying the chest now noted. The cardiomediastinal silhouette is  unremarkable. There is no evidence of focal airspace disease, pulmonary edema, suspicious pulmonary nodule/mass, pleural effusion, or pneumothorax. No acute bony abnormalities  are identified. IMPRESSION: Endotracheal and enteric tubes as described. No evidence of acute cardiopulmonary disease. Electronically Signed   By: Margarette Canada M.D.   On: 01/26/2022 10:07   DG Chest Port 1 View  Result Date: 01/26/2022 CLINICAL DATA:  Shortness of breath EXAM: PORTABLE CHEST 1 VIEW COMPARISON:  Multiple chest x-rays, most recently January 11, 2022 FINDINGS: Slightly widened mediastinal contours.  Normal heart size. No focal pulmonary opacity. No large pleural effusion or pneumothorax. The visualized upper abdomen is unremarkable. No acute osseous abnormality. IMPRESSION: Slightly widened mediastinal contours, which may be positional. However, if there is clinical concern for vascular injury, recommend further assessment with chest CT angiogram. Electronically Signed   By: Beryle Flock M.D.   On: 01/26/2022 09:37    Procedures .Critical Care  Performed by: Mickie Hillier, PA-C Authorized by: Mickie Hillier, PA-C   Critical care provider statement:    Critical care time (minutes):  75   Critical care time was exclusive of:  Separately billable procedures and treating other patients   Critical care was necessary to treat or prevent imminent or life-threatening deterioration of the following conditions:  Circulatory failure, sepsis, shock and respiratory failure   Critical care was time spent personally by me on the following activities:  Blood draw for specimens, development of treatment plan with patient or surrogate, discussions with consultants, discussions with primary provider, evaluation of patient's response to treatment, examination of patient, interpretation of cardiac output measurements, obtaining history from patient or surrogate, vascular access procedures, ventilator management, review of old  charts, re-evaluation of patient's condition, pulse oximetry, ordering and performing treatments and interventions, ordering and review of laboratory studies and ordering and review of radiographic studies   I assumed direction of critical care for this patient from another provider in my specialty: no     Care discussed with: admitting provider   Procedure Name: Intubation Date/Time: 01/26/2022 10:08 AM  Performed by: Mickie Hillier, PA-CPre-anesthesia Checklist: Patient identified, Emergency Drugs available, Suction available, Patient being monitored and Timeout performed Oxygen Delivery Method: Ambu bag Preoxygenation: Pre-oxygenation with 100% oxygen Induction Type: IV induction and Rapid sequence Ventilation: Mask ventilation without difficulty Laryngoscope Size: Mac and 4 Grade View: Grade IV Endobronchial tube: Right Tube size: 7.5 mm Number of attempts: 1 Airway Equipment and Method: Video-laryngoscopy and Stylet Placement Confirmation: ETT inserted through vocal cords under direct vision, Positive ETCO2, Breath sounds checked- equal and bilateral and CO2 detector Tube secured with: ETT holder Difficulty Due To: Difficulty was unanticipated Future Recommendations: Recommend- induction with short-acting agent, and alternative techniques readily available    ARTERIAL LINE  Date/Time: 01/26/2022 10:10 AM  Performed by: Mickie Hillier, PA-C Authorized by: Mickie Hillier, PA-C   Consent:    Consent obtained:  Emergent situation Universal protocol:    Required blood products, implants, devices, and special equipment available: yes     Site/side marked: yes     Patient identity confirmed:  Arm band, hospital-assigned identification number and anonymous protocol, patient vented/unresponsive Indications:    Indications: hemodynamic monitoring   Pre-procedure details:    Skin preparation:  Chlorhexidine Sedation:    Sedation type: intubated and sedated. Anesthesia:     Anesthesia method:  None Procedure details:    Location:  L radial   Allen's test performed: yes     Allen's test abnormal: no     Placement technique:  Ultrasound guided   Number of attempts:  1   Transducer: waveform confirmed  Post-procedure details:    Post-procedure:  Biopatch applied, secured with tape, sterile dressing applied and sutured   CMS:  Normal and unchanged   Procedure completion:  Tolerated well, no immediate complications .Central Line  Date/Time: 01/26/2022 11:01 AM  Performed by: Mickie Hillier, PA-C Authorized by: Mickie Hillier, PA-C   Consent:    Consent obtained:  Emergent situation Universal protocol:    Required blood products, implants, devices, and special equipment available: yes     Patient identity confirmed:  Anonymous protocol, patient vented/unresponsive, hospital-assigned identification number and arm band Pre-procedure details:    Indication(s): central venous access     Hand hygiene: Hand hygiene performed prior to insertion     Sterile barrier technique: All elements of maximal sterile technique followed     Skin preparation:  Chlorhexidine   Skin preparation agent: Skin preparation agent completely dried prior to procedure   Sedation:    Sedation type:  Deep Anesthesia:    Anesthesia method:  None Procedure details:    Location:  L femoral   Patient position:  Supine   Procedural supplies:  Triple lumen   Landmarks identified: yes     Ultrasound guidance: yes     Ultrasound guidance timing: prior to insertion and real time     Sterile ultrasound techniques: Sterile gel and sterile probe covers were used     Number of attempts:  1   Successful placement: yes   Post-procedure details:    Post-procedure:  Dressing applied and line sutured   Assessment:  Blood return through all ports and free fluid flow   Procedure completion:  Tolerated well, no immediate complications   Medications Ordered in ED Medications  fentaNYL  (SUBLIMAZE) 50 MCG/ML injection (has no administration in time range)  etomidate (AMIDATE) injection (20 mg Intravenous Given 01/26/22 0942)  rocuronium (ZEMURON) injection (100 mg Intravenous Given 01/26/22 0942)  0.9 %  sodium chloride infusion (1,000 mLs Intravenous New Bag/Given 01/26/22 0955)  fentaNYL 2555mg in NS 2532m(1067mml) infusion-PREMIX (has no administration in time range)  fentaNYL (SUBLIMAZE) bolus via infusion 25-100 mcg (has no administration in time range)  norepinephrine (LEVOPHED) 4mg67m 250mL28m016 mg/mL) premix infusion (has no administration in time range)  enoxaparin (LOVENOX) injection 100 mg (has no administration in time range)  lactated ringers bolus 1,000 mL (has no administration in time range)  midazolam (VERSED) injection 2 mg (has no administration in time range)  rosuvastatin (CRESTOR) tablet 20 mg (has no administration in time range)  insulin glargine-yfgn (SEMGLEE) injection 15 Units (has no administration in time range)  docusate sodium (COLACE) capsule 100 mg (has no administration in time range)  polyethylene glycol (MIRALAX / GLYCOLAX) packet 17 g (has no administration in time range)  pantoprazole (PROTONIX) 2 mg/mL oral suspension 40 mg (has no administration in time range)  acetaminophen (TYLENOL) tablet 650 mg (has no administration in time range)  ondansetron (ZOFRAN) injection 4 mg (has no administration in time range)  hydrocortisone sodium succinate (SOLU-CORTEF) 100 MG injection 100 mg (has no administration in time range)  clopidogrel (PLAVIX) tablet 75 mg (has no administration in time range)  ceFEPIme (MAXIPIME) 2 g in sodium chloride 0.9 % 100 mL IVPB (has no administration in time range)  PHENYLephrine 80 mcg/ml in normal saline Adult IV Push Syringe (For Blood Pressure Support) (160 mcg Intravenous Given 01/26/22 0940)  norepinephrine (LEVOPHED) 4-5 MG/250ML-% infusion SOLN (10 mcg/min  Rate/Dose Change 01/26/22 0946)  ceFEPIme (MAXIPIME)  2 g in  sodium chloride 0.9 % 100 mL IVPB (0 g Intravenous Stopped 01/26/22 1317)  PHENYLephrine 80 mcg/ml in normal saline Adult IV Push Syringe (For Blood Pressure Support) (80 mcg Intravenous Given 01/26/22 0943)  iohexol (OMNIPAQUE) 350 MG/ML injection 75 mL (75 mLs Intravenous Contrast Given 01/26/22 1230)    ED Course/ Medical Decision Making/ A&P                           Medical Decision Making Amount and/or Complexity of Data Reviewed Labs: ordered. Radiology: ordered.  Risk Prescription drug management. Decision regarding hospitalization.   During intubation 200neo, 2L IVF, levo and solucortef  Post intubation cardioverted for afib RVR with no response   This patient presents to the ED with chief complaint(s) of code sepsis with pertinent past medical history of meningioma, diabetes, CKD, CVA which further complicates the presenting complaint. The complaint involves an extensive differential diagnosis and treatment options and also carries with it a high risk of complications and morbidity.    The differential diagnosis includes septic shock, UTI, pneumonia, intraabdominal source. Other shock states including neurogenic. Adrenal crisis. Other encephalopathy.    Additional history obtained: Additional history obtained from family and EMS  Records reviewed Care Everywhere/External Records and Primary Care Documents most recent admission physician notes and labs   ED Course: 81 year old female presents to ED in acute distress.  Initial presentation with significant tachypnea to 50s and grunting. Initial GCS ~10. Tachycardia to 140s with soft BP. Initial differential broad but sepsis vs other shock states, cerebral edema from known meningioma, dissection, PE, ACS, tension pneumothorax, PTX, cardiac tamponade, etc.   Decision was made quickly after presentation that patient would need intubation for impending respiratory arrest. RT, pharmacist, attending and nursing at bedside for  intubation. She was preoxygenated. She did required 229mg Neosynephrine as well as 2L IVF, levophed and solu-cortef during intubation as her pressures dropped to 516Ssystolic after induction. She was then cardioverted after intubation as she was in a-fib RVR (See attending procedural note) without success. No further attempts were made. Will work on treating underlying causes.  Given hypotension, the decision was then made to place arterial line and this was done by me. See procedural note.  Placed central line by this provider. See procedural note. Required to vasopressor use and difficulty with peripheral access.   Lab Tests: I Ordered, and personally interpreted labs.  The pertinent results include:   WBC 20 Lactic 2.4 Trop 53 - heart strain  Glucose 321 + UTI Imaging Studies: I ordered and independently visualized and interpreted the following imaging CT scan head, dissection study and X-ray chest   which showed saddle embolus with RV strain. Stable meningioma with edema The interpretation of the imaging was limited to assessing for emergent pathology, for which purpose it was ordered. Cardiac Monitoring: The patient was maintained on a cardiac monitor.  I personally viewed and interpreted the cardiac monitor which showed an underlying rhythm of:  Atrial Fibrillation Medicines ordered and prescription drug management: I ordered the following medications vancomycin, cefepime for sepsis coverage, etomidate/roc for RSI, propofol gtt for sedation, levophed for hypotension I considered this additional medications: vasopressin  Reevaluation of the patient after these medicines showed that the patient    improved   1250: bilateral PE with R heart strain on CT Dissection study. Dr. HSilas Floodwith PCCM was available with ED [place]. Will not order heparin at this time given brain mass,  recent stroke. PCCM states they will consult IR. Will order lovenox per PCCM for  now.  Patient to be admitted to [facility] for ongoing management. Family has been updated on findings. She does have UTI and leukocytosis. This may be underlying, but not result of sudden onset of AMS, respiratory failure today. Saddle embolus consistent with HPI.   Consultations Obtained: I requested consultation with the admitting physician Richardson Landry Minor, NP with critical care , and discussed  findings as well as pertinent plan - they recommend: admission  Complexity of problems addressed: Patient's presentation is most consistent with  acute presentation with potential threat to life or bodily function During patient's assessment  Disposition: After consideration of the diagnostic results and the patient's response to treatment,  I feel that the patent would benefit from admission critical care  .  Social Determinants of Health: Patient's  none identified    increases the complexity of managing their presentation  Final Clinical Impression(s) / ED Diagnoses Final diagnoses:  Acute saddle pulmonary embolism with acute [facility])    Rx / DC Orders ED Discharge Orders     None         Mickie Hillier, PA-C 01/26/22 1329    Kommor, Debe Coder, MD 01/26/22 1545

## 2022-01-26 NOTE — ED Triage Notes (Signed)
Pt BIB [facility] as a Cod Sepsis for sudden onset of AMS while doing morning routine with son.  PT is tachypneic around 50 bpm, HR 150, CBG 441. Pt is hot to touch.

## 2022-01-26 NOTE — Progress Notes (Signed)
  Echocardiogram 2D Echocardiogram has been performed.  Cassandra Dodson 01/26/2022, 3:15 PM

## 2022-01-26 NOTE — Progress Notes (Signed)
[facility] Progress Note Patient Name: Cassandra Dodson DOB: 1941-01-13 MRN: [id number]   Date of Service  01/26/2022  HPI/Events of Note  Nursing reports oozing from L femoral central central venous line and requests an order for a Thrombi-pad.  eICU Interventions  Plan: Apply Thrombi-pad to oozing L femoral central venous line site.      Intervention Category Major Interventions: Other:  Lysle Dingwall 01/26/2022, 8:39 PM

## 2022-01-26 NOTE — ED Provider Notes (Signed)
.  Cardioversion  Date/Time: 01/26/2022 3:45 PM  Performed by: Teressa Lower, MD Authorized by: Teressa Lower, MD   Consent:    Consent obtained:  Emergent situation   Consent given by:  Spouse   Risks discussed:  Cutaneous burn, death, induced arrhythmia and pain   Alternatives discussed:  Rate-control medication Pre-procedure details:    Cardioversion basis:  Emergent   Rhythm:  Atrial fibrillation   Electrode placement:  Anterior-posterior Patient sedated: Yes. Refer to sedation procedure documentation for details of sedation.  Attempt one:    Cardioversion mode:  Synchronous   Shock (Joules):  200   Shock outcome:  No change in rhythm Post-procedure details:    Patient tolerance of procedure:  Tolerated well, no immediate complications     Teressa Lower, MD 01/26/22 1546

## 2022-01-27 DIAGNOSIS — I2602 Saddle embolus of pulmonary artery with acute cor pulmonale: Secondary | ICD-10-CM | POA: Diagnosis not present

## 2022-01-27 LAB — PHOSPHORUS: Phosphorus: 3.6 mg/dL (ref 2.5–4.6)

## 2022-01-27 LAB — BASIC METABOLIC PANEL
Anion gap: 12 (ref 5–15)
BUN: 24 mg/dL — ABNORMAL HIGH (ref 8–23)
CO2: 20 mmol/L — ABNORMAL LOW (ref 22–32)
Calcium: 9.8 mg/dL (ref 8.9–10.3)
Chloride: 99 mmol/L (ref 98–111)
Creatinine, Ser: 0.99 mg/dL (ref 0.44–1.00)
GFR, Estimated: 57 mL/min — ABNORMAL LOW (ref >60)
Glucose, Bld: 290 mg/dL — ABNORMAL HIGH (ref 70–99)
Potassium: 4.5 mmol/L (ref 3.5–5.1)
Sodium: 131 mmol/L — ABNORMAL LOW (ref 135–145)

## 2022-01-27 LAB — CBC
HCT: 35.9 % — ABNORMAL LOW (ref 36.0–46.0)
Hemoglobin: 11.7 g/dL — ABNORMAL LOW (ref 12.0–15.0)
MCH: 30.2 pg (ref 26.0–34.0)
MCHC: 32.6 g/dL (ref 30.0–36.0)
MCV: 92.8 fL (ref 80.0–100.0)
Platelets: 256 10*3/uL (ref 150–400)
RBC: 3.87 MIL/uL (ref 3.87–5.11)
RDW: 14 % (ref 11.5–15.5)
WBC: 10.7 10*3/uL — ABNORMAL HIGH (ref 4.0–10.5)
nRBC: 0 % (ref 0.0–0.2)

## 2022-01-27 LAB — LACTIC ACID, PLASMA: Lactic Acid, Venous: 2.4 mmol/L (ref 0.5–1.9)

## 2022-01-27 LAB — URINE CULTURE: Culture: NO GROWTH

## 2022-01-27 LAB — TRIGLYCERIDES
Triglycerides: 94 mg/dL (ref <150)
Triglycerides: 92 mg/dL (ref <150)

## 2022-01-27 LAB — MAGNESIUM: Magnesium: 1.7 mg/dL (ref 1.7–2.4)

## 2022-01-27 LAB — GLUCOSE, CAPILLARY
Glucose-Capillary: 170 mg/dL — ABNORMAL HIGH (ref 70–99)
Glucose-Capillary: 179 mg/dL — ABNORMAL HIGH (ref 70–99)
Glucose-Capillary: 235 mg/dL — ABNORMAL HIGH (ref 70–99)

## 2022-01-27 MED ORDER — LACTATED RINGERS IV SOLN: INTRAVENOUS | Status: AC

## 2022-01-27 MED ORDER — MAGNESIUM SULFATE 2 GM/50ML IV SOLN
2.0000 g | Freq: Once | INTRAVENOUS | Status: AC
  Administered 2022-01-27: 2 g via INTRAVENOUS
  Filled 2022-01-27: qty 50

## 2022-01-27 MED ORDER — "THROMBI-PAD 3""X3"" EX PADS"
1.0000 | MEDICATED_PAD | Freq: Once | CUTANEOUS | Status: AC
  Administered 2022-01-27: 1 via TOPICAL
  Filled 2022-01-27: qty 1

## 2022-01-27 MED ORDER — "THROMBI-PAD 3""X3"" EX PADS"
1.0000 | MEDICATED_PAD | Freq: Three times a day (TID) | CUTANEOUS | Status: DC | PRN
  Filled 2022-01-27: qty 1

## 2022-01-27 MED ORDER — SODIUM CHLORIDE 0.9 % IV SOLN
2.0000 g | Freq: Two times a day (BID) | INTRAVENOUS | Status: DC
  Administered 2022-01-27 – 2022-01-29 (×4): 2 g via INTRAVENOUS
  Filled 2022-01-27 (×4): qty 12.5

## 2022-01-27 MED ORDER — "THROMBI-PAD 3""X3"" EX PADS"
1.0000 | MEDICATED_PAD | CUTANEOUS | Status: DC | PRN
  Administered 2022-01-27 – 2022-01-28 (×2): 1 via TOPICAL
  Filled 2022-01-27 (×2): qty 1

## 2022-01-27 MED ORDER — CHLORHEXIDINE GLUCONATE CLOTH 2 % EX PADS
6.0000 | MEDICATED_PAD | Freq: Every day | CUTANEOUS | Status: DC
  Administered 2022-01-27 – 2022-02-16 (×22): 6 via TOPICAL

## 2022-01-27 MED ORDER — HEPARIN (PORCINE) 25000 UT/250ML-% IV SOLN: 1200.0000 [IU]/h | INTRAVENOUS | Status: DC

## 2022-01-27 MED ORDER — LACTATED RINGERS IV BOLUS
1000.0000 mL | Freq: Once | INTRAVENOUS | Status: AC
  Administered 2022-01-27: 1000 mL via INTRAVENOUS

## 2022-01-27 NOTE — Progress Notes (Signed)
[facility] ADULT ICU REPLACEMENT PROTOCOL   The patient does apply for the [facility] Adult ICU Electrolyte Replacment Protocol based on the criteria listed below:   1.Exclusion criteria: TCTS patients, ECMO patients, and Dialysis patients 2. Is GFR >/= 30 ml/min? Yes.    Patient's GFR today is 57 3. Is SCr </= 2? Yes.   Patient's SCr is 0.99 mg/dL 4. Did SCr increase >/= 0.5 in 24 hours? No. 5.Pt's weight >40kg  Yes.   6. Abnormal electrolyte(s): Mag  7. Electrolytes replaced per protocol 8.  Call MD STAT for K+ </= 2.5, Phos </= 1, or Mag </= 1 Physician:  Olean Ree Balbina Depace 01/27/2022 5:07 AM

## 2022-01-27 NOTE — Progress Notes (Addendum)
Dr Vaughan Browner notified of pt's decreasing urine output over the past 3 hours.

## 2022-01-27 NOTE — Progress Notes (Signed)
Notified Pharmacist that pt is actively oozing blood from from multiple sites. Heparin gtt is being held at this time per Pharmacist.

## 2022-01-27 NOTE — Progress Notes (Signed)
Dr. Vaughan Browner notified of pt continuing to bleed from L fem CVC despite thrombi pad and pressure dressing. Pressure bag applied to site.

## 2022-01-27 NOTE — Progress Notes (Signed)
Dr. Ardis Hughs notified of pt's left upward gaze and inability to follow commands. Also notified of bleeding from L femoral CVC site and order for plavix, although on lovenox therapeutic dosing for saddle PE. Order for plavix d/c.

## 2022-01-27 NOTE — Progress Notes (Addendum)
NAME:  Cassandra Dodson, MRN:  [id number], DOB:  [birth year], LOS: 1 ADMISSION DATE:  01/26/2022, CONSULTATION DATE:  01/27/22 REFERRING MD:  Dr. 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  Discharge from hospital 01/09/2022 with encephalopathy.  Meningioma appears stable with some mild vasogenic edema.  Was placed on dexamethasone but does not like this was continued on discharge.  She completed a course of treatment for UTI.  No organism identified.  Her mental status improved with treatment of UTI.  Pertinent  Medical History  Diabetes, meningioma, multiple CVAs  Significant Hospital Events: Including procedures, antibiotic start and stop dates in addition to other pertinent events   01/26/2022 brought to the ED with altered mental status, tachypnea, hypoxemia concerning for sepsis with septic shock.  CT angiogram shows saddle embolism.  Not a candidate for lytics or IR intervention due to meningioma and recent stroke.  Started on heparin anticoagulation  Interim History / Subjective:   Continues on norepinephrine drip which is weaning down. Remains on the ventilator.  On pressure support weans today morning Blood oozing noted from CVC site  Objective   Blood pressure (!) 124/102, pulse 98, temperature 97.9 F (36.6 C), resp. rate 12, height '5\' 3"'$  (1.6 m), weight 104.1 kg, SpO2 100 %.    Vent Mode: PSV;CPAP FiO2 (%):  [40 %-100 %] 40 % Set Rate:  [28 bmp] 28 bmp Vt Set:  [410 mL] 410 mL PEEP:  [5 cmH20-8 cmH20] 8 cmH20 Pressure Support:  [5 cmH20] 5 cmH20 Plateau  Pressure:  [14 cmH20-17 cmH20] 14 cmH20   Intake/Output Summary (Last 24 hours) at 01/27/2022 0849 Last data filed at 01/27/2022 0800 Gross per 24 hour  Intake 4943.16 ml  Output 995 ml  Net 3948.16 ml   Filed Weights   01/26/22 1326  Weight: 104.1 kg    Examination: Blood pressure (!) 124/102, pulse 98, temperature 97.9 F (36.6 C), resp. rate 12, height '5\' 3"'$  (1.6 m), weight 104.1 kg, SpO2 100 %. Gen:      No acute distress, frail, elderly HEENT:  EOMI, sclera anicteric Neck:     No masses; no thyromegaly Lungs:    Clear to auscultation bilaterally; normal respiratory effort CV:         Regular rate and rhythm; no murmurs Abd:      + bowel sounds; soft, non-tender; no palpable masses, no distension Ext:    1+ edema; adequate peripheral perfusion Skin:      Warm and dry; no rash Neuro: Sedated, upward gaze but she track and responds when asked questions.   Labs/imaging personally reviewed Significant for sodium 131, BUN/creatinine 24/0.99 WBC 10.7, hemoglobin 11.7, platelets 256  Resolved Hospital Problem list     Assessment & Plan:  Severe sepsis present on admission Likely has UTI with history of recurrent infections in the past Repeat lactic acid Consider changing cefepime to meropenem if there is no improvement as she has history of ESBL infection in 2021 Wean down pressors as tolerated Stress dose steroids as she had recent Decadron course  Acute hypoxic respiratory failure secondary to  PE Could be considered massive since she is in shock but not a candidate for IR intervention or systemic lytics due to meningioma and CVA Echo noted with severe pulmonary hypertension Change lovenox to heparin drip as she has bleeding from CVC site and prefer to use a short acting drip.  Acute metabolic encephalopathy CT head with no stroke Weaning off sedation.  Continue monitoring. Hold plaxix for now as she has oozing from CVC site  Meningioma Seen by neurosurgery recently.   No intervention.   Diabetes with hyperglycemia: Suspect stress response. SSI, Semglee  Stage 1 sacral decub and heel. POA Wound care  Best Practice (right click and "Reselect all SmartList Selections" daily)   Diet/type: NPO DVT prophylaxis: systemic dose LMWH GI prophylaxis: PPI Lines: Central line, Arterial Line, and yes and it is still needed Foley:  Yes, and it is still needed Code Status:  full code Last date of multidisciplinary goals of care discussion [updated son at bedside in ED 10/8]. Son wants full code as she is a Nurse, adult.  Critical care time:    The patient is critically ill with multiple organ system failure and requires high complexity decision making for assessment and support, frequent evaluation and titration of therapies, advanced monitoring, review of radiographic studies and interpretation of complex data.   Critical Care Time devoted to patient care services, exclusive of separately billable procedures, described in this note is 35 minutes.   Marshell Garfinkel MD [facility] See Amion for pager  If no response to pager , please call [phone] until 7pm After 7:00 pm call [place]  [phone] 01/27/2022, 9:11 AM

## 2022-01-27 NOTE — Progress Notes (Signed)
eLink Physician-Brief Progress Note Patient Name: Cassandra Dodson DOB: 1940/10/26 MRN: [id number]   Date of Service  01/27/2022  HPI/Events of Note  Received request for thrombi-pad.  Pt has some blood oozing at the insertion site of central line.   eICU Interventions  Thrombi pad ordered.     Intervention Category Intermediate Interventions: Other:  Elsie Lincoln 01/27/2022, 9:28 PM

## 2022-01-27 NOTE — Clinical Note (Incomplete)
Notified Pharmacist that pt is actively oozing blood from from multiple sites. Heparin gtt is being held at this time. Will reevaluate  starting heparin in a few hours per Pharmacist.

## 2022-01-27 NOTE — Progress Notes (Signed)
ANTICOAGULATION CONSULT NOTE - Initial Consult  Pharmacy Consult for enoxaparin > heparin Indication: pulmonary embolus  Allergies  Allergen Reactions   Anesthetics, Amide Other (See Comments)    Pt is intolerant to general anesthesia. Pt will throw up and has thrown up during procedure.    Ether Nausea And Vomiting    Patient Measurements: Height: '5\' 3"'$  (160 cm) Weight: 104.1 kg (229 lb 8 oz) IBW/kg (Calculated) : 52.4 Heparin Dosing Weight: 77 kg  Vital Signs: Temp: 99 F (37.2 C) (10/08 1215) BP: 113/81 (10/08 1200) Pulse Rate: 100 (10/08 1215)  Labs: Recent Labs    01/26/22 1147 01/26/22 1151 01/26/22 1152 01/27/22 0219  HGB 12.0 12.6 12.2 11.7*  HCT 38.0 37.0 36.0 35.9*  PLT 246  --   --  256  CREATININE 0.92 0.70  --  0.99  TROPONINIHS 53*  --   --   --     Estimated Creatinine Clearance: 50.6 mL/min (by C-G formula based on SCr of 0.99 mg/dL).   Medical History: Past Medical History:  Diagnosis Date   Arthritis    CKD (chronic kidney disease), stage III ([place])    patient denies   Depression    Diabetes mellitus without complication ([place])    Difficult intravenous access    Endometrial cancer ([place])    GERD (gastroesophageal reflux disease)    Headache    History of radiation therapy 11/04/2019-12/01/2019   Endometrial HDR; Dr. Gery Pray   Hypertension    Hypothyroidism    Neuromuscular disorder ([place])    neuropathy in feet   Osteoarthritis    PMB (postmenopausal bleeding)    PONV (postoperative nausea and vomiting)    severe nausea and vomiting after knee replacement 06-2014, did ok with 2018 knee replacement   Urinary frequency    Wears glasses      Assessment: 81 yo W with acute saddle PE with RHS 10/7 in the setting of meningioma and recent CVA. Patient was started on enoxaparin but had bleeding at CVC site s/p thrombi pad x3. No anticoagulation prior to admission. Pharmacy consulted for heparin.    Last dose of enoxaparin at 14:15. CBC  stable.   Goal of Therapy:  Heparin level 0.3-0.7 units/ml Monitor platelets by anticoagulation protocol: Yes   Plan:  Stop enoxaparin  Start Heparin 1200 units/hr at midnight, no bolus F/u 8hr HL  Monitor daily heparin level, CBC Monitor for signs/symptoms of bleeding    Benetta Spar, PharmD, BCPS, BCCP Clinical Pharmacist  Please check AMION for all [facility] phone numbers After 10:00 PM, call [place]

## 2022-01-27 NOTE — Progress Notes (Signed)
PHARMACY NOTE:  ANTIMICROBIAL RENAL DOSAGE ADJUSTMENT  Current antimicrobial regimen includes a mismatch between antimicrobial dosage and estimated renal function.  As per policy approved by the Pharmacy & Therapeutics and Medical Executive Committees, the antimicrobial dosage will be adjusted accordingly.  Current antimicrobial dosage:  Cefepime 2 gms q8hr  Indication: PNA  Renal Function:  Estimated Creatinine Clearance: 50.6 mL/min (by C-G formula based on SCr of 0.99 mg/dL).  Antimicrobial dosage has been changed to:  Cefepime 2 gm IV q12hr  Thank you for allowing pharmacy to be a part of this patient's care.  Alanda Slim, PharmD, [facility] Clinical Pharmacist Please see AMION for all Pharmacists' Contact Phone Numbers 01/27/2022, 2:12 PM

## 2022-01-28 ENCOUNTER — Encounter (HOSPITAL_COMMUNITY)
Admission: EM | Disposition: A | Discharge status: Skilled Nursing Facility | Payer: Self-pay | Source: Home / Self Care | Attending: Internal Medicine

## 2022-01-28 ENCOUNTER — Inpatient Hospital Stay (HOSPITAL_COMMUNITY): Payer: Medicare Other | Admitting: Anesthesiology

## 2022-01-28 ENCOUNTER — Inpatient Hospital Stay (HOSPITAL_COMMUNITY): Payer: Medicare Other

## 2022-01-28 ENCOUNTER — Encounter (HOSPITAL_COMMUNITY): Payer: Self-pay | Admitting: Pulmonary Disease

## 2022-01-28 DIAGNOSIS — I2699 Other pulmonary embolism without acute cor pulmonale: Secondary | ICD-10-CM | POA: Diagnosis not present

## 2022-01-28 DIAGNOSIS — I2602 Saddle embolus of pulmonary artery with acute cor pulmonale: Secondary | ICD-10-CM | POA: Diagnosis not present

## 2022-01-28 DIAGNOSIS — Z794 Long term (current) use of insulin: Secondary | ICD-10-CM

## 2022-01-28 DIAGNOSIS — I6350 Cerebral infarction due to unspecified occlusion or stenosis of unspecified cerebral artery: Secondary | ICD-10-CM | POA: Insufficient documentation

## 2022-01-28 DIAGNOSIS — I10 Essential (primary) hypertension: Secondary | ICD-10-CM

## 2022-01-28 DIAGNOSIS — E119 Type 2 diabetes mellitus without complications: Secondary | ICD-10-CM | POA: Diagnosis not present

## 2022-01-28 DIAGNOSIS — I651 Occlusion and stenosis of basilar artery: Secondary | ICD-10-CM | POA: Insufficient documentation

## 2022-01-28 DIAGNOSIS — Z7984 Long term (current) use of oral hypoglycemic drugs: Secondary | ICD-10-CM

## 2022-01-28 HISTORY — PX: IR THROMBECT PRIM MECH INIT (INCLU) MOD SED: IMG2297

## 2022-01-28 HISTORY — PX: IR US GUIDE VASC ACCESS RIGHT: IMG2390

## 2022-01-28 HISTORY — PX: RADIOLOGY WITH ANESTHESIA: SHX6223

## 2022-01-28 HISTORY — PX: IR ANGIOGRAM SELECTIVE EACH ADDITIONAL VESSEL: IMG667

## 2022-01-28 HISTORY — PX: IR ANGIOGRAM PULMONARY BILATERAL SELECTIVE: IMG664

## 2022-01-28 LAB — BASIC METABOLIC PANEL
Anion gap: 8 (ref 5–15)
BUN: 24 mg/dL — ABNORMAL HIGH (ref 8–23)
CO2: 21 mmol/L — ABNORMAL LOW (ref 22–32)
Calcium: 9.4 mg/dL (ref 8.9–10.3)
Chloride: 104 mmol/L (ref 98–111)
Creatinine, Ser: 0.81 mg/dL (ref 0.44–1.00)
GFR, Estimated: >60 mL/min (ref >60)
Glucose, Bld: 193 mg/dL — ABNORMAL HIGH (ref 70–99)
Potassium: 4.3 mmol/L (ref 3.5–5.1)
Sodium: 133 mmol/L — ABNORMAL LOW (ref 135–145)

## 2022-01-28 LAB — CBC
HCT: 30 % — ABNORMAL LOW (ref 36.0–46.0)
HCT: 24.4 % — ABNORMAL LOW (ref 36.0–46.0)
Hemoglobin: 10 g/dL — ABNORMAL LOW (ref 12.0–15.0)
Hemoglobin: 8.5 g/dL — ABNORMAL LOW (ref 12.0–15.0)
MCH: 30.5 pg (ref 26.0–34.0)
MCH: 30.9 pg (ref 26.0–34.0)
MCHC: 33.3 g/dL (ref 30.0–36.0)
MCHC: 34.8 g/dL (ref 30.0–36.0)
MCV: 91.5 fL (ref 80.0–100.0)
MCV: 88.7 fL (ref 80.0–100.0)
Platelets: 219 10*3/uL (ref 150–400)
Platelets: 198 10*3/uL (ref 150–400)
RBC: 3.28 MIL/uL — ABNORMAL LOW (ref 3.87–5.11)
RBC: 2.75 MIL/uL — ABNORMAL LOW (ref 3.87–5.11)
RDW: 13.9 % (ref 11.5–15.5)
RDW: 14.1 % (ref 11.5–15.5)
WBC: 13.1 10*3/uL — ABNORMAL HIGH (ref 4.0–10.5)
WBC: 14.6 10*3/uL — ABNORMAL HIGH (ref 4.0–10.5)
nRBC: 0 % (ref 0.0–0.2)
nRBC: 0 % (ref 0.0–0.2)

## 2022-01-28 LAB — GLUCOSE, CAPILLARY
Glucose-Capillary: 212 mg/dL — ABNORMAL HIGH (ref 70–99)
Glucose-Capillary: 121 mg/dL — ABNORMAL HIGH (ref 70–99)
Glucose-Capillary: 148 mg/dL — ABNORMAL HIGH (ref 70–99)
Glucose-Capillary: 172 mg/dL — ABNORMAL HIGH (ref 70–99)
Glucose-Capillary: 140 mg/dL — ABNORMAL HIGH (ref 70–99)
Glucose-Capillary: 91 mg/dL (ref 70–99)
Glucose-Capillary: 104 mg/dL — ABNORMAL HIGH (ref 70–99)
Glucose-Capillary: 207 mg/dL — ABNORMAL HIGH (ref 70–99)
Glucose-Capillary: 267 mg/dL — ABNORMAL HIGH (ref 70–99)

## 2022-01-28 LAB — POCT I-STAT 7, (LYTES, BLD GAS, ICA,H+H)
Acid-base deficit: 2 mmol/L (ref 0.0–2.0)
Bicarbonate: 25.1 mmol/L (ref 20.0–28.0)
Calcium, Ion: 1.36 mmol/L (ref 1.15–1.40)
HCT: 23 % — ABNORMAL LOW (ref 36.0–46.0)
Hemoglobin: 7.8 g/dL — ABNORMAL LOW (ref 12.0–15.0)
O2 Saturation: 100 %
Patient temperature: 35.9
Potassium: 4.2 mmol/L (ref 3.5–5.1)
Sodium: 134 mmol/L — ABNORMAL LOW (ref 135–145)
TCO2: 27 mmol/L (ref 22–32)
pCO2 arterial: 53.2 mmHg — ABNORMAL HIGH (ref 32–48)
pH, Arterial: 7.277 — ABNORMAL LOW (ref 7.35–7.45)
pO2, Arterial: 422 mmHg — ABNORMAL HIGH (ref 83–108)

## 2022-01-28 LAB — PHOSPHORUS: Phosphorus: 2.4 mg/dL — ABNORMAL LOW (ref 2.5–4.6)

## 2022-01-28 LAB — POCT ACTIVATED CLOTTING TIME
Activated Clotting Time: 161 seconds
Activated Clotting Time: 233 seconds
Activated Clotting Time: 233 seconds

## 2022-01-28 LAB — MAGNESIUM: Magnesium: 1.9 mg/dL (ref 1.7–2.4)

## 2022-01-28 LAB — PREPARE RBC (CROSSMATCH)

## 2022-01-28 SURGERY — IR WITH ANESTHESIA
Anesthesia: General

## 2022-01-28 MED ORDER — IOHEXOL 300 MG/ML  SOLN
100.0000 mL | Freq: Once | INTRAMUSCULAR | Status: AC | PRN
  Administered 2022-01-28: 100 mL via INTRA_ARTERIAL

## 2022-01-28 MED ORDER — LABETALOL HCL 5 MG/ML IV SOLN
10.0000 mg | INTRAVENOUS | Status: DC | PRN
  Administered 2022-01-28: 10 mg via INTRAVENOUS
  Filled 2022-01-28: qty 4

## 2022-01-28 MED ORDER — LIDOCAINE HCL (PF) 1 % IJ SOLN
5.0000 mL | Freq: Once | INTRAMUSCULAR | Status: AC
  Administered 2022-01-28: 5 mL via INTRADERMAL

## 2022-01-28 MED ORDER — SODIUM CHLORIDE 0.9 % IV SOLN
10.0000 mL/h | Freq: Once | INTRAVENOUS | Status: AC
  Administered 2022-01-29: 10 mL/h via INTRAVENOUS

## 2022-01-28 MED ORDER — PROPOFOL 10 MG/ML IV BOLUS
INTRAVENOUS | Status: DC | PRN
  Administered 2022-01-28: 30 mg via INTRAVENOUS

## 2022-01-28 MED ORDER — DOCUSATE SODIUM 50 MG/5ML PO LIQD
100.0000 mg | Freq: Two times a day (BID) | ORAL | Status: DC
  Administered 2022-01-28 – 2022-02-04 (×9): 100 mg
  Filled 2022-01-28 (×12): qty 10

## 2022-01-28 MED ORDER — HEPARIN (PORCINE) 25000 UT/250ML-% IV SOLN
1300.0000 [IU]/h | INTRAVENOUS | Status: DC
  Administered 2022-01-28 (×2): 1300 [IU]/h via INTRAVENOUS
  Filled 2022-01-28: qty 250

## 2022-01-28 MED ORDER — PROPOFOL 500 MG/50ML IV EMUL
INTRAVENOUS | Status: DC | PRN
  Administered 2022-01-28: 35 ug/kg/min via INTRAVENOUS

## 2022-01-28 MED ORDER — LIDOCAINE HCL (PF) 1 % IJ SOLN
INTRAMUSCULAR | Status: AC
  Filled 2022-01-28: qty 5

## 2022-01-28 MED ORDER — IOHEXOL 300 MG/ML  SOLN
100.0000 mL | Freq: Once | INTRAMUSCULAR | Status: AC | PRN
  Administered 2022-01-28: 30 mL via INTRA_ARTERIAL

## 2022-01-28 MED ORDER — ROCURONIUM BROMIDE 10 MG/ML (PF) SYRINGE
PREFILLED_SYRINGE | INTRAVENOUS | Status: DC | PRN
  Administered 2022-01-28: 20 mg via INTRAVENOUS
  Administered 2022-01-28: 50 mg via INTRAVENOUS

## 2022-01-28 MED ORDER — ROSUVASTATIN CALCIUM 20 MG PO TABS
20.0000 mg | ORAL_TABLET | Freq: Every day | ORAL | Status: DC
  Administered 2022-01-29 – 2022-02-02 (×4): 20 mg
  Filled 2022-01-28 (×7): qty 1

## 2022-01-28 MED ORDER — LACTATED RINGERS IV SOLN: INTRAVENOUS | Status: DC | PRN

## 2022-01-28 MED ORDER — VITAL AF 1.2 CAL PO LIQD
1000.0000 mL | ORAL | Status: DC
  Administered 2022-01-28 – 2022-01-31 (×5): 1000 mL

## 2022-01-28 MED ORDER — HEPARIN SODIUM (PORCINE) 1000 UNIT/ML IJ SOLN
INTRAMUSCULAR | Status: DC | PRN
  Administered 2022-01-28: 5000 [IU] via INTRAVENOUS

## 2022-01-28 MED ORDER — PROSOURCE TF20 ENFIT COMPATIBL EN LIQD: 60.0000 mL | Freq: Every day | ENTERAL | Status: DC

## 2022-01-28 MED ORDER — DEXAMETHASONE SODIUM PHOSPHATE 10 MG/ML IJ SOLN
INTRAMUSCULAR | Status: DC | PRN
  Administered 2022-01-28: 10 mg via INTRAVENOUS

## 2022-01-28 MED ORDER — PHENYLEPHRINE HCL-NACL 20-0.9 MG/250ML-% IV SOLN
INTRAVENOUS | Status: DC | PRN
  Administered 2022-01-28: 10 ug/min via INTRAVENOUS

## 2022-01-28 MED ORDER — LIDOCAINE HCL 1 % IJ SOLN
INTRAMUSCULAR | Status: AC
  Administered 2022-01-28: 5 mL
  Filled 2022-01-28: qty 20

## 2022-01-28 NOTE — Progress Notes (Signed)
eLink Physician-Brief Progress Note Patient Name: Cassandra Dodson DOB: 25-Jun-1940 MRN: [id number]   Date of Service  01/28/2022  HPI/Events of Note  Notified by RN that EKG was done as rhythm looked atrial fibrillation on the monitor.  Pt remains on levophed.   EKG shows sinus tachycardia, rate 104 with PACs. BP 133/91.  eICU Interventions  Continue to monitor.  Titrate levophed to maintain MAP >65.      Intervention Category Intermediate Interventions: Diagnostic test evaluation  Elsie Lincoln 01/28/2022, 2:46 AM

## 2022-01-28 NOTE — Consult Note (Signed)
Chief Complaint: Patient was seen in consultation today for pulmonary embolus  Referring Physician(s): Dr. Ina Homes  Supervising Physician: Arne Cleveland  Patient Status: [facility] - In-pt  History of Present Illness: Cassandra Dodson is a 81 y.o. 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 She is now admitted with altered mental status, acute respiratory failure, tachypnea, and hypoxia concerning for septic shock. She was found to have a saddle embolism with right heart strain. Troponin was elevated at 55.  IR was consulted for possible lytic therapy, however patient was not felt to be a candidate due to the presence of her meningioma.  Thrombectomy was not considered due to septic shock.  She has since become more stable with pressors, intubation, and IV antibiotics.  IR re-consulted for possible thrombectomy. Case reviewed by Dr. Vernard Gambles who approves patient for the procedure.   Past Medical History:  Diagnosis Date   Arthritis    CKD (chronic kidney disease), stage III ([place])    patient denies   Depression    Diabetes mellitus without complication ([place])    Difficult intravenous access    Endometrial cancer ([place])    GERD (gastroesophageal reflux disease)    Headache    History of radiation therapy 11/04/2019-12/01/2019   Endometrial HDR; Dr. Gery Pray   Hypertension    Hypothyroidism    Neuromuscular disorder ([place])    neuropathy in feet   Osteoarthritis    PMB (postmenopausal bleeding)    PONV (postoperative nausea and vomiting)    severe nausea and vomiting after knee replacement 06-2014, did ok with 2018 knee replacement   Urinary frequency    Wears glasses     Past Surgical History:  Procedure Laterality Date   BREAST SURGERY     cyst  removed   CESAREAN SECTION     DILATION AND CURETTAGE OF UTERUS N/A 06/24/2019   Procedure: DILATATION AND CURETTAGE;  Surgeon: Lafonda Mosses, MD;  Location: [place];  Service: Gynecology;  Laterality: N/A;   FRACTURE SURGERY     right knee   INTRAUTERINE DEVICE (IUD) INSERTION N/A 06/24/2019   Procedure: INTRAUTERINE DEVICE (IUD) INSERTION MIRENA;  Surgeon: Lafonda Mosses, MD;  Location: [facility];  Service: Gynecology;  Laterality: N/A;   IR ANGIO INTRA EXTRACRAN SEL COM CAROTID INNOMINATE UNI R MOD SED  11/05/2021   IR ANGIO VERTEBRAL SEL VERTEBRAL UNI R MOD SED  11/05/2021   IR CT HEAD LTD  11/01/2021   IR INTRA CRAN STENT  11/01/2021   IR RADIOLOGIST EVAL & MGMT  12/19/2021   IR US GUIDE VASC ACCESS RIGHT  11/01/2021   IRRIGATION AND DEBRIDEMENT SEBACEOUS CYST     JOINT REPLACEMENT Right    right   LYMPH NODE DISSECTION N/A 09/14/2019   Procedure: LYMPH NODE DISSECTION;  Surgeon: Lafonda Mosses, MD;  Location: [facility];  Service: Gynecology;  Laterality: N/A;   RADIOLOGY WITH ANESTHESIA N/A 11/01/2021   Procedure: ANGIOPLASTY/STENT;  Surgeon: Luanne Bras, MD;  Location: [place];  Service: Radiology;  Laterality: N/A;   ROBOTIC ASSISTED TOTAL HYSTERECTOMY WITH BILATERAL SALPINGO OOPHERECTOMY Bilateral 09/14/2019   Procedure: XI ROBOTIC ASSISTED TOTAL HYSTERECTOMY WITH BILATERAL SALPINGO OOPHORECTOMY;  Surgeon: Lafonda Mosses, MD;  Location: [facility];  Service: Gynecology;  Laterality: Bilateral;   SENTINEL NODE  BIOPSY N/A 09/14/2019   Procedure: SENTINEL NODE BIOPSY;  Surgeon: Lafonda Mosses, MD;  Location: [facility];  Service: Gynecology;  Laterality: N/A;   teeth extration     TONSILLECTOMY     TOTAL HIP ARTHROPLASTY Right 02/23/2015   Procedure: RIGHT TOTAL HIP ARTHROPLASTY ANTERIOR APPROACH;  Surgeon: Leandrew Koyanagi, MD;  Location: [place];  Service: Orthopedics;  Laterality: Right;   TOTAL KNEE ARTHROPLASTY Left 06/27/2016   Procedure:  LEFT TOTAL KNEE ARTHROPLASTY;  Surgeon: Leandrew Koyanagi, MD;  Location: [place];  Service: Orthopedics;  Laterality: Left;    Allergies: Anesthetics, amide and Ether  Medications: Prior to Admission medications   Medication Sig Start Date End Date Taking? Authorizing Provider  amLODipine (NORVASC) 5 MG tablet Take 1 tablet (5 mg total) by mouth daily. 01/18/22 02/17/22 Yes Pahwani, Einar Grad, MD  aspirin EC 81 MG tablet Take 1 tablet (81 mg total) by mouth daily for 16 days. Swallow whole. 01/18/22 02/03/22 Yes Pahwani, Einar Grad, MD  calcium carbonate (OS-CAL - DOSED IN MG OF ELEMENTAL CALCIUM) 1250 (500 Ca) MG tablet Take 1 tablet (1,250 mg total) by mouth daily with breakfast. 11/28/21  Yes Love, Ivan Anchors, PA-C  clopidogrel (PLAVIX) 75 MG tablet Take 1 tablet (75 mg total) by mouth daily. 11/27/21  Yes Love, Ivan Anchors, PA-C  docusate sodium (COLACE) 100 MG capsule Take 1 capsule (100 mg total) by mouth 2 (two) times daily. 11/27/21  Yes Love, Ivan Anchors, PA-C  fenofibrate 160 MG tablet Take 1 tablet (160 mg total) by mouth daily after breakfast. 11/27/21  Yes Love, Ivan Anchors, PA-C  folic acid (FOLVITE) 1 MG tablet Take 1 tablet (1 mg total) by mouth daily. 11/27/21  Yes Love, Ivan Anchors, PA-C  insulin glargine (LANTUS) 100 UNIT/ML Solostar Pen Inject 15 Units into the skin daily. 01/17/22  Yes Pahwani, Einar Grad, MD  insulin lispro (HUMALOG) 100 UNIT/ML KwikPen Inject 1 Units into the skin 3 (three) times daily with meals. 11/27/21  Yes Love, Ivan Anchors, PA-C  methylphenidate (RITALIN) 5 MG tablet Take 1 tablet (5 mg total) by mouth 2 (two) times daily with breakfast and lunch. 11/27/21  Yes Love, Ivan Anchors, PA-C  Mouthwashes (MOUTH RINSE) LIQD solution 15 mLs by Mouth Rinse route as needed (for oral care). 11/05/21  Yes Sheikh, Omair Latif, DO  pantoprazole (PROTONIX) 40 MG tablet Take 1 tablet (40 mg total) by mouth daily. 11/27/21  Yes Love, Ivan Anchors, PA-C  rosuvastatin (CRESTOR) 20 MG tablet Take 1 tablet (20 mg total) by mouth daily.  11/27/21  Yes Love, Ivan Anchors, PA-C  senna (SENOKOT) 8.6 MG TABS tablet Take 13.2 mg by mouth at bedtime as needed for constipation. 12/18/21  Yes [provider]  traMADol (ULTRAM) 50 MG tablet Take 50 mg by mouth daily as needed for pain. 01/09/22  Yes [provider]  Zinc Oxide (DESITIN) 13 % CREA Apply 1 application  topically 2 (two) times daily. To buttocks for protection. 11/27/21  Yes Love, Ivan Anchors, PA-C  Insulin Pen Needle 32G X 4 MM MISC Use daily in the morning, at noon, and at bedtime. 11/27/21   Bary Leriche, PA-C     Family History  Problem Relation Age of Onset   Pancreatic cancer Mother    Stroke Father    Hypertension Father    Colon cancer Neg Hx    Breast cancer Neg Hx    Ovarian cancer Neg Hx    Endometrial cancer Neg Hx  Prostate cancer Neg Hx     Social History   Socioeconomic History   Marital status: Widowed    Spouse name: Not on file   Number of children: Not on file   Years of education: Not on file   Highest education level: Not on file  Occupational History   Not on file  Tobacco Use   Smoking status: Former    Packs/day: 1.50    Years: 45.00    Total pack years: 67.50    Types: Cigarettes    Quit date: 04/22/2002    Years since quitting: 19.7   Smokeless tobacco: Never  Vaping Use   Vaping Use: Never used  Substance and Sexual Activity   Alcohol use: Yes    Comment: rarely   Drug use: No   Sexual activity: Not Currently  Other Topics Concern   Not on file  Social History Narrative   Not on file   Social Determinants of Health   Financial Resource Strain: Not on file  Food Insecurity: Not on file  Transportation Needs: Not on file  Physical Activity: Not on file  Stress: Not on file  Social Connections: Not on file     Review of Systems: A 12 point ROS discussed and pertinent positives are indicated in the HPI above.  All other systems are negative.  Review of Systems  Unable to perform ROS: Intubated     Vital Signs: BP (!) 143/91 (BP Location: Right Arm)   Pulse 97   Temp 98.8 F (37.1 C)   Resp 11   Ht '5\' 3"'$  (1.6 m)   Wt 188 lb 0.8 oz (85.3 kg)   SpO2 100%   BMI 33.31 kg/m   Physical Exam Vitals and nursing note reviewed.  Constitutional:      Appearance: She is ill-appearing.     Comments: Intubated, sedated  HENT:     Mouth/Throat:     Mouth: Mucous membranes are moist.     Pharynx: Oropharynx is clear.  Cardiovascular:     Rate and Rhythm: Tachycardia present.  Pulmonary:     Comments: Inubated, decreased breath sounds on left. Skin:    General: Skin is warm.  Neurological:     Comments: sedated      MD Evaluation Airway: WNL Heart: WNL Abdomen: WNL Chest/ Lungs: WNL ASA  Classification: 3 Mallampati/Airway Score: Two   Imaging: ECHOCARDIOGRAM LIMITED  Result Date: 01/26/2022    ECHOCARDIOGRAM LIMITED REPORT   Patient Name:   Cassandra Dodson Date of Exam: 01/26/2022 Medical Rec #:  [id number]    Height:       63.0 in Accession #:    [id number]   Weight:       229.5 lb Date of Birth:  [birth year]    BSA:          2.050 m Patient Age:    74 years     BP:           108/70 mmHg Patient Gender: F            HR:           121 bpm. Exam Location:  Inpatient Procedure: Limited Echo, Color Doppler and Cardiac Doppler STAT ECHO Indications:    pulmonary embolus  History:        Patient has prior history of Echocardiogram examinations. Cor                 pulmonale, chronic kidney disease; Risk Factors:Hypertension,  Dyslipidemia and Diabetes.  Sonographer:    Johny Chess RDCS Referring Phys: [phone] MATTHEW R HUNSUCKER  Sonographer Comments: Technically difficult study due to poor echo windows and echo performed with patient supine and on artificial respirator. Image acquisition challenging due to respiratory motion. IMPRESSIONS  1. There is a suggestion of systolic anterior motion of the mitral valve, probably due to the hyperdynamic LV contractility and  underfilled left ventricle. Doppler interrogation of LVOT gradients was not performed. Left ventricular ejection fraction, by  estimation, is >75%. The left ventricle has hyperdynamic function. The left ventricle has no regional wall motion abnormalities. The interventricular septum is flattened in systole and diastole, consistent with right ventricular pressure and volume overload.  2. Right ventricular systolic function is moderately reduced. The right ventricular size is moderately enlarged. There is severely elevated pulmonary artery systolic pressure.  3. Right atrial size was mildly dilated.  4. The mitral valve is normal in structure. No evidence of mitral valve regurgitation.  5. The tricuspid valve is abnormal. Tricuspid valve regurgitation is moderate to severe.  6. The aortic valve is tricuspid. Aortic valve regurgitation is not visualized. Aortic valve sclerosis/calcification is present, without any evidence of aortic stenosis.  7. The inferior vena cava is dilated in size with <50% respiratory variability, suggesting right atrial pressure of 15 mmHg. FINDINGS  Left Ventricle: There is a suggestion of systolic anterior motion of the mitral valve, probably due to the hyperdynamic LV contractility and underfilled left ventricle. Doppler interrogation of LVOT gradients was not performed. Left ventricular ejection  fraction, by estimation, is >75%. The left ventricle has hyperdynamic function. The left ventricle has no regional wall motion abnormalities. The left ventricular internal cavity size was small. The interventricular septum is flattened in systole and diastole, consistent with right ventricular pressure and volume overload. Right Ventricle: The right ventricular size is moderately enlarged. Right ventricular systolic function is moderately reduced. There is severely elevated pulmonary artery systolic pressure. The tricuspid regurgitant velocity is 4.11 m/s, and with an assumed right atrial pressure  of 15 mmHg, the estimated right ventricular systolic pressure is 23.5 mmHg. Left Atrium: Left atrial size was normal in size. Right Atrium: Right atrial size was mildly dilated. Prominent Eustachian valve. Pericardium: There is no evidence of pericardial effusion. Mitral Valve: The mitral valve is normal in structure. Mild mitral annular calcification. Tricuspid Valve: The tricuspid valve is abnormal. Tricuspid valve regurgitation is moderate to severe. Aortic Valve: The noncoronary cusp is rigid and calcified. The aortic valve is tricuspid. Aortic valve regurgitation is not visualized. Aortic valve sclerosis/calcification is present, without any evidence of aortic stenosis. Pulmonic Valve: The pulmonic valve was grossly normal. Pulmonic valve regurgitation is mild. Venous: The inferior vena cava is dilated in size with less than 50% respiratory variability, suggesting right atrial pressure of 15 mmHg. IAS/Shunts: No atrial level shunt detected by color flow Doppler. RIGHT VENTRICLE             IVC RV S prime:     10.10 cm/s  IVC diam: 2.00 cm TAPSE (M-mode): 1.4 cm TRICUSPID VALVE TR Peak grad:   67.6 mmHg TR Vmax:        411.00 cm/s Dani Gobble Croitoru MD Electronically signed by Sanda Klein MD Signature Date/Time: 01/26/2022/3:34:11 PM    Final    CT Head Wo Contrast  Result Date: 01/26/2022 CLINICAL DATA:  81 year old female with altered mental status, code sepsis, tachycardia, tachypnea, hyperglycemia. None meningioma. EXAM: CT HEAD WITHOUT CONTRAST TECHNIQUE: Contiguous axial images were  obtained from the base of the skull through the vertex without intravenous contrast. RADIATION DOSE REDUCTION: This exam was performed according to the departmental dose-optimization program which includes automated exposure control, adjustment of the mA and/or kV according to patient size and/or use of iterative reconstruction technique. COMPARISON:  Brain MRI 01/13/2022.  Head CT 01/16/2022. 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 Regional mass effect is stable. Basilar cisterns remain patent. Streak artifact through the superior hemispheres today. No acute intracranial hemorrhage identified. No cortically based acute infarct identified. Fairly extensive chronic ischemic disease in the brainstem, cerebellum, left thalamus. Vascular: Extensive Calcified atherosclerosis at the skull base. Chronic basilar artery vascular stent. No suspicious intracranial vascular hyperdensity. Skull: No acute osseous abnormality identified. Sinuses/Orbits: Visualized paranasal sinuses and mastoids are stable and well aerated. Other: Layering fluid in the visible pharynx. The patient is intubated on contemporary CTA chest. No acute orbit or scalp soft tissue finding. IMPRESSION: 1. Stable non contrast CT appearance of the brain, with large chronic left planum sphenoidale meningioma with associated regional edema and mass effect is stable from last month. 2. Advanced chronic ischemic disease.  Chronic Basilar Artery stent. 3.  No new intracranial abnormality. Electronically Signed   By: Genevie Ann M.D.   On: 01/26/2022 13:00   CT Angio Chest/Abd/Pel for Dissection W and/or Wo Contrast  Result Date: 01/26/2022 CLINICAL DATA:  81 year old female with altered mental status, code sepsis, tachycardia, tachypnea, hyperglycemia. EXAM: CT ANGIOGRAPHY CHEST, ABDOMEN AND PELVIS TECHNIQUE: Non-contrast CT of the chest was initially obtained. Multidetector CT imaging through the chest, abdomen and pelvis was performed using the standard protocol during bolus administration of intravenous contrast. Multiplanar reconstructed images and MIPs were obtained and reviewed to evaluate the vascular anatomy. RADIATION DOSE REDUCTION: This exam was performed according to the departmental dose-optimization program which includes automated  exposure control, adjustment of the mA and/or kV according to patient size and/or use of iterative reconstruction technique. CONTRAST:  35m OMNIPAQUE IOHEXOL 350 MG/ML SOLN COMPARISON:  No prior chest CTA.  CT Abdomen and Pelvis 03/30/2019. FINDINGS: CTA CHEST FINDINGS Cardiovascular: Pulmonary artery saddle embolus. Bilateral lobar pulmonary artery thrombus. Abnormal RV LV ratio 1.5. Calcified aortic atherosclerosis. Calcified coronary artery atherosclerosis. No cardiomegaly or pericardial effusion. Mediastinum/Nodes: Negative. Enteric tube courses through the esophagus. Lungs/Pleura: Intubated. Endotracheal tube tip in good position above the carina. Major airways remain patent. Mild dependent opacity in the lungs is greater on the right and is enhancing more suggestive of atelectasis than pulmonary infarct. No pleural effusion. Musculoskeletal: No acute osseous abnormality identified. Review of the MIP images confirms the above findings. CTA ABDOMEN AND PELVIS FINDINGS VASCULAR Reflux of contrast to the hepatic IVC, hepatic veins, and also into the infrarenal IVC and pelvic veins. Extensive Aortoiliac calcified atherosclerosis. Negative for aortic dissection or aneurysm. Major arterial structures in the abdomen and pelvis remain patent. There is a left femoral venous catheter in place. Review of the MIP images confirms the above findings. NON-VASCULAR Hepatobiliary: Contrast reflux into the liver which otherwise appears negative. Negative gallbladder. Pancreas: Partially atrophied. Spleen: Negative. Adrenals/Urinary Tract: Adrenal glands and kidneys appears stable and negative. Foley catheter is in place within the bladder, which is mostly obscured by pelvis hip arthroplasty artifact. Stomach/Bowel: Rectosigmoid retained stool. No dilated large or small bowel. Widespread large bowel diverticulosis. No free air or free fluid identified. Negative appendix visible on series 6, image  24. No convincing bowel  inflammation. Enteric tube terminates in the distal gastric body. Lymphatic: No lymphadenopathy identified. Reproductive: Mostly obscured by hip arthroplasty streak artifact. Other: No pelvic free fluid. Musculoskeletal: Advanced lumbar spine degeneration. Chronic right hip arthroplasty. No acute osseous abnormality identified. Review of the MIP images confirms the above findings. Study discussed by telephone with Dr. Matilde Sprang on 01/26/2022 at 12:48 . IMPRESSION: 1. Positive for Acute PE with Saddle Embolus and Right Heart Strain (RV/LV Ratio = 1.5) consistent with at least Submassive (intermediate risk) PE. The presence of right heart strain has been associated with an increased risk of morbidity and mortality. Please refer to the "Code PE Focused" order set in EPIC. Critical Value/emergent results were called by telephone at the time of interpretation on 01/26/2022 at at 1248 hours to Dr. Matilde Sprang, who verbally acknowledged these results. 2. No pleural effusion, and mild dependent lung opacity is more suggestive of atelectasis than pulmonary infarct. 3. No acute or inflammatory process identified in the abdomen or pelvis. Widespread large bowel diverticulosis without evidence of acute diverticulitis. 4. Satisfactory ET tube, enteric tube, Foley catheter placement. 5. Aortic Atherosclerosis (ICD10-I70.0). Electronically Signed   By: Genevie Ann M.D.   On: 01/26/2022 12:54   DG Chest Port 1 View  Result Date: 01/26/2022 CLINICAL DATA:  Intubation. EXAM: PORTABLE CHEST 1 VIEW COMPARISON:  01/26/2022 FINDINGS: An endotracheal tube with tip 4 cm above the carina, and enteric tube entering the stomach with tip off the field of view noted. Defibrillator/pacing pad overlying the chest now noted. The cardiomediastinal silhouette is unremarkable. There is no evidence of focal airspace disease, pulmonary edema, suspicious pulmonary nodule/mass, pleural effusion, or pneumothorax. No acute bony abnormalities are identified.  IMPRESSION: Endotracheal and enteric tubes as described. No evidence of acute cardiopulmonary disease. Electronically Signed   By: Margarette Canada M.D.   On: 01/26/2022 10:07   DG Chest Port 1 View  Result Date: 01/26/2022 CLINICAL DATA:  Shortness of breath EXAM: PORTABLE CHEST 1 VIEW COMPARISON:  Multiple chest x-rays, most recently January 11, 2022 FINDINGS: Slightly widened mediastinal contours.  Normal heart size. No focal pulmonary opacity. No large pleural effusion or pneumothorax. The visualized upper abdomen is unremarkable. No acute osseous abnormality. IMPRESSION: Slightly widened mediastinal contours, which may be positional. However, if there is clinical concern for vascular injury, recommend further assessment with chest CT angiogram. Electronically Signed   By: Beryle Flock M.D.   On: 01/26/2022 09:37   CT ANGIO HEAD W OR WO CONTRAST  Result Date: 01/16/2022 CLINICAL DATA:  Stroke follow-up EXAM: CT ANGIOGRAPHY HEAD TECHNIQUE: Multidetector CT imaging of the head was performed using the standard protocol during bolus administration of intravenous contrast. Multiplanar CT image reconstructions and MIPs were obtained to evaluate the vascular anatomy. RADIATION DOSE REDUCTION: This exam was performed according to the departmental dose-optimization program which includes automated exposure control, adjustment of the mA and/or kV according to patient size and/or use of iterative reconstruction technique. CONTRAST:  45m OMNIPAQUE IOHEXOL 350 MG/ML SOLN COMPARISON:  Brain CT 10/25/2021, MRI brain 01/13/2022 and 11/15/2021 FINDINGS: CT HEAD Brain: Redemonstrated is large planum sphenoidale meningioma measuring 4.3 x 3.3 x 4.2 cm with surrounding vasogenic edema. There is associated mass effect with a proximally 1.8 cm rightward midline shift of the medial left frontal lobe cortex. There is sequela of moderate chronic microvascular disease with a chronic infarct in the left corona radiata (series 7,  image 20), a chronic infarct in the right cerebellum (series 7, image  7), and also an evolving acute infarct in the right pons (series 7, image 12). Vascular: Redemonstrated basilar artery stent, new compared to 10/25/2021. There is a large vessel that traverses the center of this mass (series 11, image 81). The arterial vasculature likely arises from the ethmoidal arteries. Skull: Normal. Negative for fracture or focal lesion. Sinuses: No acute or significant finding. Other: None. CTA HEAD Anterior circulation: ACA-there is mild focal stenosis in the proximal right A1 segment. Bilateral A2 segments are displaced to the right secondary to mass effect from the large planum sphenoidale meningioma. Both are patent. MCA- Carotids-mild focal stenosis of the cavernous segment of the right ICA on the right (series 12, image 86). Posterior circulation: PCA-there is a focal area of moderate to high grade narrowing at the P1 P2 junction on the right (series 11, image 99). The P2 segment of the left PCA predominantly receives its blood supply from the left PCOM (compatible with a fetal type left PCA). Basilar-Redemonstrated stent within the basilar artery. The patency of the stent is difficult to assess. There is mild narrowing of the basilar artery along the proximal aspect of the stent. Vertebral-The left vertebral artery demonstrates multifocal areas of narrowing and is likely focally occluded in the distal V4 segment (series 11, image 131), new compared to 10/26/2021. Venous sinuses: Patency of the venous sinuses is incompletely assessed due to poor opacification. Anatomic variants: Fetal type PCA on the left. Review of the MIP images confirms the above findings. IMPRESSION: 1. Redemonstrated large planum sphenoidale meningioma with surrounding vasogenic edema and mass effect. Bilateral A2 segments are rightwardly displaced, but patent. This mass demonstrates central vascularity, which likely arises from the ethmoidal  arteries. 2. Multifocal areas of narrowing in the left vertebral artery, which is likely focally occluded in the distal V4 segment, new compared to 10/26/2021. 3. Redemonstrated basilar artery stent, new compared to 10/25/2021. The patency of the stent is difficult to assess. There is mild narrowing of the basilar artery along the proximal aspect of the stent. Electronically Signed   By: Marin Roberts M.D.   On: 01/16/2022 09:08   MR BRAIN W WO CONTRAST  Result Date: 01/13/2022 CLINICAL DATA:  Left-sided weakness. Meningioma. EXAM: MRI HEAD WITHOUT AND WITH CONTRAST TECHNIQUE: Multiplanar, multiecho pulse sequences of the brain and surrounding structures were obtained without and with intravenous contrast. CONTRAST:  10 mL [employer]way COMPARISON:  MR head without contrast 11/15/2021. MR head without and with contrast 10/25/2021. 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 Surrounding vasogenic edema is stable. Subfalcine midline shift again noted. Edema extends into the left external capsule and superiorly in the frontal lobe. Edema within the anterior genu of the corpus callosum is stable. Continued evolution of the right paramidline pontine infarct is noted. Remote posterior right cerebellar infarct is present. Remote lacunar infarct is present in the left thalamus. Periventricular white matter changes are stable. Diffusion-weighted images demonstrate a punctate focus of restricted diffusion within the anterior aspect of the right paramedian pontine infarct. Vascular: Abnormal signal is again noted within the left vertebral artery suggesting slower occluded flow. Flow is present in the right vertebral artery and basilar artery. Flow is present in the anterior circulation. Skull and upper cervical spine: The craniocervical junction is normal. Upper cervical spine is within normal limits. Marrow signal is unremarkable. Sinuses/Orbits: The paranasal sinuses and  mastoid air cells are clear. The globes and orbits are within normal limits. IMPRESSION: 1. Continued  evolution of the right paramidline pontine infarct. 2. Punctate focus of diffusion abnormality within the anterior aspect of the previous infarct. This may represent acute infarct of some residual tissue. It is largely encompassed within the evolving prior infarct. 3. Stable appearance of a sphenoidale meningioma with surrounding vasogenic edema and midline shift. 4. Remote posterior right cerebellar infarct. 5. Remote lacunar infarct of the left thalamus. 6. Stable white matter disease. Electronically Signed   By: San Morelle M.D.   On: 01/13/2022 16:55   EEG adult  Result Date: 01/12/2022 Lora Havens, MD     01/12/2022  8:39 PM Patient Name: Cassandra Dodson MRN: [id number] Epilepsy Attending: Lora Havens Referring Physician/Provider: Albertine Patricia, MD Date: 01/12/2022 Duration: 23 mins Patient history: 81yo f with ams and meningioma. EEG to evaluate for seizure Level of alertness: Awake AEDs during EEG study: None Technical aspects: This EEG study was done with scalp electrodes positioned according to the 10-20 International system of electrode placement. Electrical activity was reviewed with band pass filter of 1-'70Hz'$ , sensitivity of 7 uV/mm, display speed of 62m/sec with a '60Hz'$  notched filter applied as appropriate. EEG data were recorded continuously and digitally stored.  Video monitoring was available and reviewed as appropriate. Description: The posterior dominant rhythm consists of '8Hz'$  activity of moderate voltage (25-35 uV) seen predominantly in posterior head regions, symmetric and reactive to eye opening and eye closing. EEG showed intermittent generalized and maximal bifrontal 3 to 6 Hz theta-delta slowing. Hyperventilation and photic stimulation were not performed.   ABNORMALITY - Intermittent slow, generalized and maximal bifrontal IMPRESSION: This study is suggestive of non  specific cortical dysfunction arising from bifrontal region. Additionally there is mild diffuse encephalopathy, nonspecific etiology. No seizures or epileptiform discharges were seen throughout the recording. PLora Havens  CT Head Wo Contrast  Result Date: 01/11/2022 CLINICAL DATA:  Initial evaluation for mental status change, unknown cause. EXAM: CT HEAD WITHOUT CONTRAST TECHNIQUE: Contiguous axial images were obtained from the base of the skull through the vertex without intravenous contrast. RADIATION DOSE REDUCTION: This exam was performed according to the departmental dose-optimization program which includes automated exposure control, adjustment of the mA and/or kV according to patient size and/or use of iterative reconstruction technique. COMPARISON:  Prior study from 11/15/2021. FINDINGS: Brain: Age-related cerebral atrophy with chronic small vessel ischemic disease. Chronic right cerebellar infarct noted. Additional remote lacunar infarcts noted about the thalami. No acute intracranial hemorrhage. No visible acute large vessel territory infarct. Large meningioma positioned along the planum sphenoidale again seen relatively stable in size measuring 5.0 x 4.3 x 3.5 cm. Surrounding vasogenic edema is similar. Associated left-to-right shift of up to approximately 2 cm, stable. No other mass lesion. No hydrocephalus or extra-axial fluid collection. Vascular: Calcified atherosclerosis present at the skull base with vascular stent in place within the basilar. No abnormal hyperdense vessel. Skull: Scalp soft tissues and calvarium within normal limits. Sinuses/Orbits: Globes orbital soft tissues within normal limits. Paranasal sinuses and mastoid air cells are largely clear. Other: None. IMPRESSION: 1. No acute intracranial abnormality. 2. Stable size and appearance of large 5 cm meningioma positioned along the planum sphenoidale with associated vasogenic edema and regional mass effect. 3. Age-related  cerebral atrophy with chronic small vessel ischemic disease with chronic right cerebellar and thalamic infarcts. Electronically Signed   By: BJeannine BogaM.D.   On: 01/11/2022 20:18   DG Chest Port 1 View  Result Date: 01/11/2022 CLINICAL DATA:  Altered mental status EXAM:  PORTABLE CHEST 1 VIEW COMPARISON:  11/16/2021 FINDINGS: Cardiac size is within normal limits. Thoracic aorta is tortuous. Lung fields are clear of any infiltrates or pulmonary edema. There is no pleural effusion or pneumothorax. Degenerative changes are noted in left shoulder. IMPRESSION: No active disease. Electronically Signed   By: Elmer Picker M.D.   On: 01/11/2022 19:41    Labs:  CBC: Recent Labs    01/14/22 0901 01/26/22 1043 01/26/22 1147 01/26/22 1151 01/26/22 1152 01/27/22 0219 01/28/22 0208  WBC 7.3  --  20.1*  --   --  10.7* 13.1*  HGB 12.6   < > 12.0 12.6 12.2 11.7* 10.0*  HCT 37.9   < > 38.0 37.0 36.0 35.9* 30.0*  PLT 176  --  246  --   --  256 219   < > = values in this interval not displayed.    COAGS: Recent Labs    10/25/21 1540 10/30/21 0449 11/01/21 1127  INR 1.1 1.2 1.2  APTT 25  --   --     BMP: Recent Labs    01/17/22 0551 01/26/22 1043 01/26/22 1147 01/26/22 1151 01/26/22 1152 01/27/22 0219 01/28/22 0208  NA 132*   < > 133* 133* 133* 131* 133*  K 4.2   < > 3.8 3.8 3.8 4.5 4.3  CL 100  --  102 102  --  99 104  CO2 23  --  19*  --   --  20* 21*  GLUCOSE 194*  --  321* 323*  --  290* 193*  BUN 32*  --  17 17  --  24* 24*  CALCIUM 10.1  --  9.3  --   --  9.8 9.4  CREATININE 0.85  --  0.92 0.70  --  0.99 0.81  GFRNONAA >60  --  >60  --   --  57* >60   < > = values in this interval not displayed.    LIVER FUNCTION TESTS: Recent Labs    11/06/21 0545 01/11/22 2128 01/12/22 0654 01/26/22 1147  BILITOT 0.6 0.5 0.4 1.1  AST 13* 21 14* 23  ALT '10 8 9 16  '$ ALKPHOS 49 62 58 93  PROT 5.3* 6.5 6.0* 5.9*  ALBUMIN 2.7* 3.3* 3.0* 2.7*    TUMOR MARKERS: No  results for input(s): "AFPTM", "CEA", "CA199", "CHROMGRNA" in the last 8760 hours.  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  She was found to have large burden, saddle PE by CT scan. ECHO showed right ventricular enlargement.  Intervention was considered over the weekend, however was deferred give her inability/poor candidacy for lytic therapy and her concern for septic shock.  Patient has temporized with use of pressors and antibiotics. She remains intubated.   PA spoke with son, Cassandra Dodson.  Pilar Plate discussion about patient's clinical decline PTA, her current status, and the risks/benefits of the procedure were discussed.  [name] would like to proceed with intervention as there are few (if any) additional interventions to offer.  He would like for Mrs. Riepe to remain FULL CODE during the procedure.    PA discussed with team.  Dr. Vernard Gambles made aware.  Anesthesia has been consulted to assist with vent/airway/perfusion during the case.   Risks and benefits discussed with the patient including, but not limited to bleeding, infection, vascular injury, pulmonary embolism, need for  tunneled HD catheter placement or even death.  All of the patient's questions were answered, patient is agreeable to proceed. Consent signed and in chart.   Thank you for this interesting consult.  I greatly enjoyed meeting Cassandra Dodson and look forward to participating in their care.  A copy of this report was sent to the requesting provider on this date.  Electronically Signed: Docia Barrier, PA 01/28/2022, 10:26 AM   I spent a total of 50 Miinutes    in face to face in clinical consultation, greater than 50% of which was counseling/coordinating care for pulmonary embolus.

## 2022-01-28 NOTE — Progress Notes (Signed)
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  Patient found to have a pulmonary embolism.  Given altered mental status, patient required to be intubated.  Patient had low blood pressures requiring Levophed.  Patient was started on Lovenox for PE and admitted to ICU for further work-up.  Diabetes, meningioma, multiple CVAs  Pertinent  Medical History  Diabetes, meningioma, multiple CVAs  Significant Hospital Events: Including procedures, antibiotic start and stop dates in addition to other pertinent events   01/26/2022 brought to the ED with altered mental status, tachypnea, hypoxemia concerning for sepsis with septic shock.  CT angiogram shows saddle embolism.  Not a candidate for lytics or IR intervention due to meningioma and recent stroke.   01/27/2022-patient heparin held overnight given continuous bleeding from central venous catheter  Interim History / Subjective:  Heparin held overnight.  Patient is intubated upon my exam.  Patient has a little alert.  She can open her eyes to verbal stimuli.  Patient is otherwise unable to follow commands.  Nurse did mention that the patient has had little urine output, even thought patient is up 5L in this admission.  Objective   Blood pressure (!) 143/91, pulse 97, temperature 98.8 F (37.1 C), resp. rate 11, height '5\' 3"'$  (1.6 m), weight 85.3 kg, SpO2 100 %.    Vent Mode: PRVC FiO2 (%):  [30 %-40 %] 30 % Set Rate:  [28 bmp] 28 bmp Vt Set:  [410 mL] 410 mL PEEP:  [5 cmH20] 5 cmH20 Pressure Support:  [8 cmH20] 8 cmH20 Plateau Pressure:  [14 cmH20] 14 cmH20   Intake/Output Summary (Last 24 hours) at 01/28/2022 1029 Last data filed at 01/28/2022  0756 Gross per 24 hour  Intake 1280.84 ml  Output 395 ml  Net 885.84 ml   Filed Weights   01/27/22 0400 01/28/22 0400 01/28/22 0800  Weight: 81.2 kg 81.3 kg 85.3 kg    Examination: General: Patient is sedated and intubated. Eyes: Right gaze noted.  Not able to track. Pupils equal and reactive. Head: Normocephalic, atraumatic  Cardio: Tachycardic, regular rhythm.  No murmur rubs or gallops noted Pulmonary: Coarse lung sounds throughout Abdomen: Soft, with normoactive bowel sounds Neuro: Intubated and sedated.  Not able to follow commands.  Does open eyes spontaneously.  Can withdraw to pain. Extremities: Swelling noted to bilateral upper extremities with more left than right. Skin: Bruising noted throughout.  Left femoral central line oozing. Wounds dressed   Resolved Hospital Problem list     Assessment & Plan:  This is a 81 year old female with a past medical history of a meningioma, diabetes, and multiple CVAs who presented with altered mental status.  Patient found to have pulmonary embolism with very low blood pressures requiring pressors.  Patient admitted to the ICU started on Lovenox and pressors, and admitted for further work-up.  #Acute metabolic encephalopathy, likely secondary to saddle massive pulmonary embolism and severe sepsis Patient remains intubated.  Patient currently on fentanyl sedation.  Blood pressures remaining stable.  Pressors have been weaned off at this time.  Urine culture negative for any growth at this time.  Treating empirically.  Patient is still not able to follow commands on my exam.  Patient can open her eyes spontaneously  but cannot follow commands at this time.  Not safe for extubation at this time.  Patient is still bleeding from her left femoral central venous site.  Lovenox was switched to heparin, but heparin was never started due to bleeding.  Patient still bleeding this morning.  Consider mechanical thrombectomy today. -Use Levophed as needed  keep MAP greater than 65 -Day 3 cefepime -Follow cultures -Consider mechanical thrombectomy today -Continue to wean sedation as appropriate -Switch to heparin   #Acute hypoxic respiratory failure likely secondary to above Patient is still requiring intubation given the patient is not able to follow commands.  Patient is not safe to protect her airway.  Patient satting well at 100% on her ventilator settings of PRVC, rate of 28, PEEP 5%.  Wean as tolerated. -Vap bundle in place -Wean as tolerated to keep O2 saturation greater than 92% -Pulmonary hygiene  #Blood loss anemia Patient continues to have a downtrend in hemoglobin during hospitalization.  This is likely secondary to a bleed from the intravenous catheter.  On exam, patient has bruising noted to bilateral upper and lower extremities.  Patient did get Lovenox during hospitalization for pulmonary embolism.  This could be playing a role.  Placed a pursestring suture on left catheter today. -Lovenox being held currently -Continue to monitor CBC -Transfusion if patient Hgb drop below 7   #Meningioma Stable at this time.  No intervention needed. -Monitor  #Type 2 diabetes mellitus Blood glucose ranging 170s -Continue glucose monitoring -Continue with 15 units semglee at bedtime  -Sliding Scale insulin q4H -Continue Crestor 20 mg  #Stage I sacral decubitus ulcer and heel ulcer #Present on admission Stable -Continue wound care  #Leukocytosis likely likely reactive WBC up to 13.1 this AM.  This could be reactive likely due to massive PE -Continue to monitor  Best Practice (right click and "Reselect all SmartList Selections" daily)   Diet/type: NPO DVT prophylaxis: systemic heparin GI prophylaxis: PPI Lines: Central line left femoral  Foley:  Yes, and it is still needed Code Status:  full code  Labs   CBC: Recent Labs  Lab 01/26/22 1147 01/26/22 1151 01/26/22 1152 01/27/22 0219 01/28/22 0208  WBC 20.1*  --   --   10.7* 13.1*  NEUTROABS 17.8*  --   --   --   --   HGB 12.0 12.6 12.2 11.7* 10.0*  HCT 38.0 37.0 36.0 35.9* 30.0*  MCV 96.4  --   --  92.8 91.5  PLT 246  --   --  256 623    Basic Metabolic Panel: Recent Labs  Lab 01/26/22 1147 01/26/22 1151 01/26/22 1152 01/27/22 0219 01/28/22 0208  NA 133* 133* 133* 131* 133*  K 3.8 3.8 3.8 4.5 4.3  CL 102 102  --  99 104  CO2 19*  --   --  20* 21*  GLUCOSE 321* 323*  --  290* 193*  BUN 17 17  --  24* 24*  CREATININE 0.92 0.70  --  0.99 0.81  CALCIUM 9.3  --   --  9.8 9.4  MG  --   --   --  1.7 1.9  PHOS  --   --   --  3.6 2.4*   GFR: Estimated Creatinine Clearance: 55.5 mL/min (by C-G formula based on SCr of 0.81 mg/dL). Recent Labs  Lab 01/26/22 1147 01/26/22 1452 01/27/22 0219 01/27/22 0951 01/28/22 0208  WBC 20.1*  --  10.7*  --  13.1*  LATICACIDVEN 2.4* 2.8*  --  2.4*  --     Liver Function Tests: Recent Labs  Lab 01/26/22 1147  AST 23  ALT 16  ALKPHOS 93  BILITOT 1.1  PROT 5.9*  ALBUMIN 2.7*   No results for input(s): "LIPASE", "AMYLASE" in the last 168 hours. No results for input(s): "AMMONIA" in the last 168 hours.  ABG    Component Value Date/Time   PHART 7.315 (L) 01/26/2022 1043   PCO2ART 39.7 01/26/2022 1043   PO2ART 357 (H) 01/26/2022 1043   HCO3 19.7 (L) 01/26/2022 1152   TCO2 21 (L) 01/26/2022 1152   ACIDBASEDEF 7.0 (H) 01/26/2022 1152   O2SAT 91 01/26/2022 1152     Coagulation Profile: No results for input(s): "INR", "PROTIME" in the last 168 hours.  Cardiac Enzymes: No results for input(s): "CKTOTAL", "CKMB", "CKMBINDEX", "TROPONINI" in the last 168 hours.  HbA1C: Hemoglobin A1C  Date/Time Value Ref Range Status  01/23/2015 12:00 AM 7.6  Final   Hgb A1c MFr Bld  Date/Time Value Ref Range Status  01/16/2022 08:52 AM 6.0 (H) 4.8 - 5.6 % Final    Comment:    (NOTE) Pre diabetes:          5.7%-6.4%  Diabetes:              >6.4%  Glycemic control for   <7.0% adults with diabetes    10/26/2021 02:25 AM 7.0 (H) 4.8 - 5.6 % Final    Comment:    (NOTE) Pre diabetes:          5.7%-6.4%  Diabetes:              >6.4%  Glycemic control for   <7.0% adults with diabetes     CBG: Recent Labs  Lab 01/27/22 1536 01/27/22 1917 01/27/22 2331 01/28/22 0316 01/28/22 0725  GLUCAP 121* 148* 179* 172* 140*    Review of Systems:   Negative except for what is stated in the HPI.   Past Medical History:  She,  has a past medical history of Arthritis, CKD (chronic kidney disease), stage III ([place]), Depression, Diabetes mellitus without complication ([place]), Difficult intravenous access, Endometrial cancer ([place]), GERD (gastroesophageal reflux disease), Headache, History of radiation therapy (11/04/2019-12/01/2019), Hypertension, Hypothyroidism, Neuromuscular disorder ([place]), Osteoarthritis, PMB (postmenopausal bleeding), PONV (postoperative nausea and vomiting), Urinary frequency, and Wears glasses.   Surgical History:   Past Surgical History:  Procedure Laterality Date   BREAST SURGERY     cyst removed   CESAREAN SECTION     DILATION AND CURETTAGE OF UTERUS N/A 06/24/2019   Procedure: DILATATION AND CURETTAGE;  Surgeon: Lafonda Mosses, MD;  Location: [facility];  Service: Gynecology;  Laterality: N/A;   FRACTURE SURGERY     right knee   INTRAUTERINE DEVICE (IUD) INSERTION N/A 06/24/2019   Procedure: INTRAUTERINE DEVICE (IUD) INSERTION MIRENA;  Surgeon: Lafonda Mosses, MD;  Location: [facility];  Service: Gynecology;  Laterality: N/A;   IR ANGIO INTRA EXTRACRAN SEL COM CAROTID INNOMINATE UNI R MOD SED  11/05/2021   IR ANGIO VERTEBRAL SEL VERTEBRAL UNI R MOD SED  11/05/2021   IR CT HEAD LTD  11/01/2021   IR INTRA CRAN STENT  11/01/2021   IR RADIOLOGIST EVAL & MGMT  12/19/2021   IR US GUIDE VASC ACCESS RIGHT  11/01/2021   IRRIGATION AND DEBRIDEMENT SEBACEOUS CYST     JOINT REPLACEMENT Right    right   LYMPH NODE DISSECTION N/A 09/14/2019    Procedure: LYMPH NODE DISSECTION;  Surgeon:  Lafonda Mosses, MD;  Location: [facility];  Service: Gynecology;  Laterality: N/A;   RADIOLOGY WITH ANESTHESIA N/A 11/01/2021   Procedure: ANGIOPLASTY/STENT;  Surgeon: Luanne Bras, MD;  Location: [place];  Service: Radiology;  Laterality: N/A;   ROBOTIC ASSISTED TOTAL HYSTERECTOMY WITH BILATERAL SALPINGO OOPHERECTOMY Bilateral 09/14/2019   Procedure: XI ROBOTIC ASSISTED TOTAL HYSTERECTOMY WITH BILATERAL SALPINGO OOPHORECTOMY;  Surgeon: Lafonda Mosses, MD;  Location: [facility];  Service: Gynecology;  Laterality: Bilateral;   SENTINEL NODE BIOPSY N/A 09/14/2019   Procedure: SENTINEL NODE BIOPSY;  Surgeon: Lafonda Mosses, MD;  Location: [facility];  Service: Gynecology;  Laterality: N/A;   teeth extration     TONSILLECTOMY     TOTAL HIP ARTHROPLASTY Right 02/23/2015   Procedure: RIGHT TOTAL HIP ARTHROPLASTY ANTERIOR APPROACH;  Surgeon: Leandrew Koyanagi, MD;  Location: [place];  Service: Orthopedics;  Laterality: Right;   TOTAL KNEE ARTHROPLASTY Left 06/27/2016   Procedure: LEFT TOTAL KNEE ARTHROPLASTY;  Surgeon: Leandrew Koyanagi, MD;  Location: [place];  Service: Orthopedics;  Laterality: Left;     Social History:   reports that she quit smoking about 19 years ago. Her smoking use included cigarettes. She has a 67.50 pack-year smoking history. She has never used smokeless tobacco. She reports current alcohol use. She reports that she does not use drugs.   Family History:  Her family history includes Hypertension in her father; Pancreatic cancer in her mother; Stroke in her father. There is no history of Colon cancer, Breast cancer, Ovarian cancer, Endometrial cancer, or Prostate cancer.   Allergies Allergies  Allergen Reactions   Anesthetics, Amide Other (See Comments)    Pt is intolerant to general anesthesia. Pt will throw up and has thrown up during procedure.    Ether Nausea And Vomiting     Home Medications  Prior to Admission medications    Medication Sig Start Date End Date Taking? Authorizing Provider  amLODipine (NORVASC) 5 MG tablet Take 1 tablet (5 mg total) by mouth daily. 01/18/22 02/17/22 Yes Pahwani, Einar Grad, MD  aspirin EC 81 MG tablet Take 1 tablet (81 mg total) by mouth daily for 16 days. Swallow whole. 01/18/22 02/03/22 Yes Pahwani, Einar Grad, MD  calcium carbonate (OS-CAL - DOSED IN MG OF ELEMENTAL CALCIUM) 1250 (500 Ca) MG tablet Take 1 tablet (1,250 mg total) by mouth daily with breakfast. 11/28/21  Yes Love, Ivan Anchors, PA-C  clopidogrel (PLAVIX) 75 MG tablet Take 1 tablet (75 mg total) by mouth daily. 11/27/21  Yes Love, Ivan Anchors, PA-C  docusate sodium (COLACE) 100 MG capsule Take 1 capsule (100 mg total) by mouth 2 (two) times daily. 11/27/21  Yes Love, Ivan Anchors, PA-C  fenofibrate 160 MG tablet Take 1 tablet (160 mg total) by mouth daily after breakfast. 11/27/21  Yes Love, Ivan Anchors, PA-C  folic acid (FOLVITE) 1 MG tablet Take 1 tablet (1 mg total) by mouth daily. 11/27/21  Yes Love, Ivan Anchors, PA-C  insulin glargine (LANTUS) 100 UNIT/ML Solostar Pen Inject 15 Units into the skin daily. 01/17/22  Yes Pahwani, Einar Grad, MD  insulin lispro (HUMALOG) 100 UNIT/ML KwikPen Inject 1 Units into the skin 3 (three) times daily with meals. 11/27/21  Yes Love, Ivan Anchors, PA-C  methylphenidate (RITALIN) 5 MG tablet Take 1 tablet (5 mg total) by mouth 2 (two) times daily with breakfast and lunch. 11/27/21  Yes Love, Ivan Anchors, PA-C  Mouthwashes (MOUTH RINSE) LIQD solution 15 mLs by Mouth Rinse route as needed (for oral care). 11/05/21  Yes Sheikh, Omair Latif, DO  pantoprazole (PROTONIX) 40 MG tablet Take 1 tablet (40 mg total) by mouth daily. 11/27/21  Yes Love, Ivan Anchors, PA-C  rosuvastatin (CRESTOR) 20 MG tablet Take 1 tablet (20 mg total) by mouth daily. 11/27/21  Yes Love, Ivan Anchors, PA-C  senna (SENOKOT) 8.6 MG TABS tablet Take 13.2 mg by mouth at bedtime as needed for constipation. 12/18/21  Yes [provider]  traMADol (ULTRAM) 50 MG tablet Take 50 mg by  mouth daily as needed for pain. 01/09/22  Yes [provider]  Zinc Oxide (DESITIN) 13 % CREA Apply 1 application  topically 2 (two) times daily. To buttocks for protection. 11/27/21  Yes Love, Ivan Anchors, PA-C  Insulin Pen Needle 32G X 4 MM MISC Use daily in the morning, at noon, and at bedtime. 11/27/21   Bary Leriche, PA-C     Critical care time: [place], DO Internal Medicine Resident PGY-1 Pager: [phone]

## 2022-01-28 NOTE — Sedation Documentation (Addendum)
LEFT PA 54/13 (28) PRE

## 2022-01-28 NOTE — Procedures (Signed)
  Procedure:  Bilat pulm art thrombectomy   Preprocedure diagnosis: The encounter diagnosis was Acute saddle pulmonary embolism with acute cor pulmonale ([place]).  Postprocedure diagnosis: same EBL:    minimal Complications:   none immediate  See full dictation in [employer].  Dillard Cannon MD Main # [phone] Pager  [phone] Mobile [phone]

## 2022-01-28 NOTE — Transfer of Care (Signed)
Immediate Anesthesia Transfer of Care Note  Patient: LAQUINDA MOLLER  Procedure(s) Performed: IR WITH ANESTHESIA  Patient Location: SICU  Anesthesia Type:General  Level of Consciousness: Patient remains intubated per anesthesia plan  Airway & Oxygen Therapy: Patient placed on Ventilator (see vital sign flow sheet for setting)  Post-op Assessment: Report given to RN  Post vital signs: stable  Last Vitals:  Vitals Value Taken Time  BP    Temp    Pulse 84 01/28/22 1445  Resp 25 01/28/22 1445  SpO2 100 % 01/28/22 1445  Vitals shown include unvalidated device data.  Last Pain:  Vitals:   01/26/22 1500  TempSrc: Bladder         Complications: No notable events documented.

## 2022-01-28 NOTE — Progress Notes (Signed)
[place] for heparin Indication: pulmonary embolus  Allergies  Allergen Reactions   Anesthetics, Amide Other (See Comments)    Pt is intolerant to general anesthesia. Pt will throw up and has thrown up during procedure.    Ether Nausea And Vomiting    Patient Measurements: Height: '5\' 3"'$  (160 cm) Weight: 85.3 kg (188 lb 0.8 oz) IBW/kg (Calculated) : 52.4 Heparin Dosing Weight: 77 kg  Vital Signs: Temp: 98.6 F (37 C) (10/09 1900) BP: 97/70 (10/09 1845) Pulse Rate: 97 (10/09 1900)  Labs: Recent Labs    01/26/22 1147 01/26/22 1151 01/26/22 1152 01/27/22 0219 01/28/22 0208 01/28/22 1325 01/28/22 1859  HGB 12.0 12.6   < > 11.7* 10.0* 7.8* 8.5*  HCT 38.0 37.0   < > 35.9* 30.0* 23.0* 24.4*  PLT 246  --   --  256 219  --  198  CREATININE 0.92 0.70  --  0.99 0.81  --   --   TROPONINIHS 53*  --   --   --   --   --   --    < > = values in this interval not displayed.     Estimated Creatinine Clearance: 55.5 mL/min (by C-G formula based on SCr of 0.81 mg/dL).   Medical History: Past Medical History:  Diagnosis Date   Arthritis    CKD (chronic kidney disease), stage III ([place])    patient denies   Depression    Diabetes mellitus without complication ([place])    Difficult intravenous access    Endometrial cancer ([place])    GERD (gastroesophageal reflux disease)    Headache    History of radiation therapy 11/04/2019-12/01/2019   Endometrial HDR; Dr. Gery Pray   Hypertension    Hypothyroidism    Neuromuscular disorder ([place])    neuropathy in feet   Osteoarthritis    PMB (postmenopausal bleeding)    PONV (postoperative nausea and vomiting)    severe nausea and vomiting after knee replacement 06-2014, did ok with 2018 knee replacement   Urinary frequency    Wears glasses      Assessment: 81 yo W with acute saddle PE with RHS 10/7 in the setting of meningioma and recent CVA. Patient was started on enoxaparin but had bleeding at  CVC site s/p thrombi pad x3. No anticoagulation prior to admission. Pharmacy consulted for heparin.  He is now s/p thrombectomy and heparin was restarted post procedure -hg= 8.5 (has been 10-12)   Goal of Therapy:  Heparin level 0.3-0.7 units/ml Monitor platelets by anticoagulation protocol: Yes   Plan:  -Continue heparin at 1300 units/hr -Heparin level in 8 hours and daily wth CBC daily  Hildred Laser, PharmD Clinical Pharmacist **Pharmacist phone directory can now be found on [email or web address] ([email or web address]).  Listed under [place].

## 2022-01-28 NOTE — Sedation Documentation (Signed)
LEFT PA 38/10 (20) Post

## 2022-01-28 NOTE — Progress Notes (Addendum)
Initial Nutrition Assessment  DOCUMENTATION CODES:   Not applicable  INTERVENTION:   Initiate tube feeding via OG tube: Vital AF 1.2 at 25 ml/h, increase by 10 ml every 4 hours to goal rate of 65 ml/h (1560 ml per day)  Provides 1872 kcal, 117 gm protein, 1265 ml free water daily.  NUTRITION DIAGNOSIS:   Inadequate oral intake related to inability to eat as evidenced by NPO status.  GOAL:   Patient will meet greater than or equal to 90% of their needs  MONITOR:   Vent status, TF tolerance, Labs, Skin  REASON FOR ASSESSMENT:   Ventilator, Consult Enteral/tube feeding initiation and management  ASSESSMENT:   81 yo female admitted with massive pulmonary embolism with acute respiratory failure. PMH includes multiple CVAs, meningioma, endometrial cancer, HTN, DM, hypothyroidism, neuropathy.  Patient is receiving mechanical thrombectomy in IR today. Received MD Consult for TF initiation and management. OG tube in place. Per chest x-ray 10/7, the tube enters the stomach with tip off the field of view. TF to begin after IR procedure today.  Patient is currently intubated on ventilator support MV: 12.8 L/min Temp (24hrs), Avg:98.9 F (37.2 C), Min:97.9 F (36.6 C), Max:99.5 F (37.5 C)   Labs reviewed. Na 133, phos 2.4 CBG: 172-140-91  Medications reviewed and include Colace, Novolog, Semglee, Protonix, fentanyl, Levophed.  Weight history reviewed. Weight has fluctuated between 76.7 kg and 104.1 kg within the past 4 months. Currently 85.3 kg.  Net IO Since Admission: 5,013.18 mL [01/28/22 1347] Patient with mild generalized edema per RN assessment.   NUTRITION - FOCUSED PHYSICAL EXAM:  Unable to complete  Diet Order:   Diet Order             Diet NPO time specified  Diet effective now                   EDUCATION NEEDS:   Not appropriate for education at this time  Skin:  Skin Assessment: Skin Integrity Issues: Skin Integrity Issues:: Stage I Stage  I: buttocks, L heel  Last BM:  no BM documented  Height:   Ht Readings from Last 1 Encounters:  01/26/22 '5\' 3"'$  (1.6 m)    Weight:   Wt Readings from Last 1 Encounters:  01/28/22 85.3 kg    Ideal Body Weight:  52.3 kg  BMI:  Body mass index is 33.31 kg/m.  Estimated Nutritional Needs:   Kcal:  1700-1900  Protein:  105-125 gm  Fluid:  1.7-1.9 L   Cassandra Dodson RD, LDN, CNSC Please refer to Amion for contact information.

## 2022-01-28 NOTE — Anesthesia Postprocedure Evaluation (Signed)
Anesthesia Post Note  Patient: Cassandra Dodson  Procedure(s) Performed: IR WITH ANESTHESIA     Patient location during evaluation: SICU Anesthesia Type: General Level of consciousness: sedated Pain management: pain level controlled Vital Signs Assessment: post-procedure vital signs reviewed and stable Respiratory status: patient remains intubated per anesthesia plan Cardiovascular status: stable Postop Assessment: no apparent nausea or vomiting Anesthetic complications: no   No notable events documented.  Last Vitals:  Vitals:   01/28/22 1615 01/28/22 1630  BP: (!) 147/92   Pulse: 75 76  Resp: (!) 28 (!) 28  Temp: (!) 35.5 C (!) 35.6 C  SpO2: 100% 100%    Last Pain:  Vitals:   01/26/22 1500  TempSrc: Bladder                 Belenda Cruise P Rudene Poulsen

## 2022-01-28 NOTE — Anesthesia Preprocedure Evaluation (Addendum)
Anesthesia Evaluation   Patient unresponsive    Reviewed: Allergy & Precautions, H&P , Patient's Chart, lab work & pertinent test results  History of Anesthesia Complications (+) PONV and history of anesthetic complications  Airway Mallampati: Intubated       Dental  (+) Poor Dentition, Dental Advisory Given   Pulmonary former smoker Intubated   Pulmonary exam normal        Cardiovascular hypertension, Pt. on medications Normal cardiovascular exam  10/26/2021 ECHO: EF 60-65%, normal LVF, no regional wall motion abnormalities, mod ASH of basal septum, normal RVF, no significant valvular abnormalities   Neuro/Psych  Headaches   Depression    Meningioma CVA, Residual Symptoms  negative psych ROS   GI/Hepatic Neg liver ROS,GERD  ,,  Endo/Other  diabetes, Insulin Dependent, Oral Hypoglycemic AgentsHypothyroidism    Renal/GU Renal InsufficiencyRenal diseaseUTI: sepsis  negative genitourinary   Musculoskeletal  (+) Arthritis , Osteoarthritis,    Abdominal Normal abdominal exam  (+)   Peds  Hematology negative hematology ROS (+)   Anesthesia Other Findings   Reproductive/Obstetrics negative OB ROS                             Anesthesia Physical  Anesthesia Plan  ASA: 4 and emergent  Anesthesia Plan: General   Post-op Pain Management: Minimal or no pain anticipated   Induction: Intravenous  PONV Risk Score and Plan: 4 or greater and Ondansetron and Treatment may vary due to age or medical condition  Airway Management Planned: Oral ETT  Additional Equipment: Arterial line  Intra-op Plan:   Post-operative Plan: Post-operative intubation/ventilation  Informed Consent:   Plan Discussed with: CRNA and Anesthesiologist  Anesthesia Plan Comments:         Anesthesia Quick Evaluation

## 2022-01-28 NOTE — Progress Notes (Signed)
Dr. Tamala Julian notified of pt's rapid drop in blood pressure and having to restart levophed drip despite being off pressors all day. No recent sedation given. No signs of overt bleeding from R groin puncture site, L art line, or L fem CVC line. Orders for CBC obtained

## 2022-01-29 ENCOUNTER — Inpatient Hospital Stay (HOSPITAL_COMMUNITY): Payer: Medicare Other

## 2022-01-29 ENCOUNTER — Encounter (HOSPITAL_COMMUNITY): Payer: Self-pay | Admitting: Radiology

## 2022-01-29 DIAGNOSIS — I2602 Saddle embolus of pulmonary artery with acute cor pulmonale: Secondary | ICD-10-CM | POA: Diagnosis not present

## 2022-01-29 LAB — CBC
HCT: 21.1 % — ABNORMAL LOW (ref 36.0–46.0)
HCT: 25.2 % — ABNORMAL LOW (ref 36.0–46.0)
Hemoglobin: 7.3 g/dL — ABNORMAL LOW (ref 12.0–15.0)
Hemoglobin: 9 g/dL — ABNORMAL LOW (ref 12.0–15.0)
MCH: 30.9 pg (ref 26.0–34.0)
MCH: 30.1 pg (ref 26.0–34.0)
MCHC: 34.6 g/dL (ref 30.0–36.0)
MCHC: 35.7 g/dL (ref 30.0–36.0)
MCV: 89.4 fL (ref 80.0–100.0)
MCV: 84.3 fL (ref 80.0–100.0)
Platelets: 212 10*3/uL (ref 150–400)
Platelets: 119 10*3/uL — ABNORMAL LOW (ref 150–400)
RBC: 2.36 MIL/uL — ABNORMAL LOW (ref 3.87–5.11)
RBC: 2.99 MIL/uL — ABNORMAL LOW (ref 3.87–5.11)
RDW: 14.2 % (ref 11.5–15.5)
RDW: 14.5 % (ref 11.5–15.5)
WBC: 15.2 10*3/uL — ABNORMAL HIGH (ref 4.0–10.5)
WBC: 13.4 10*3/uL — ABNORMAL HIGH (ref 4.0–10.5)
nRBC: 0.1 % (ref 0.0–0.2)
nRBC: 0.3 % — ABNORMAL HIGH (ref 0.0–0.2)

## 2022-01-29 LAB — TYPE AND SCREEN
ABO/RH(D): O POS
Antibody Screen: NEGATIVE
Unit division: 0
Unit division: 0

## 2022-01-29 LAB — GLUCOSE, CAPILLARY
Glucose-Capillary: 178 mg/dL — ABNORMAL HIGH (ref 70–99)
Glucose-Capillary: 189 mg/dL — ABNORMAL HIGH (ref 70–99)
Glucose-Capillary: 198 mg/dL — ABNORMAL HIGH (ref 70–99)
Glucose-Capillary: 215 mg/dL — ABNORMAL HIGH (ref 70–99)
Glucose-Capillary: 275 mg/dL — ABNORMAL HIGH (ref 70–99)
Glucose-Capillary: 322 mg/dL — ABNORMAL HIGH (ref 70–99)

## 2022-01-29 LAB — BASIC METABOLIC PANEL
Anion gap: 8 (ref 5–15)
BUN: 33 mg/dL — ABNORMAL HIGH (ref 8–23)
CO2: 21 mmol/L — ABNORMAL LOW (ref 22–32)
Calcium: 8.4 mg/dL — ABNORMAL LOW (ref 8.9–10.3)
Chloride: 103 mmol/L (ref 98–111)
Creatinine, Ser: 1.03 mg/dL — ABNORMAL HIGH (ref 0.44–1.00)
GFR, Estimated: 54 mL/min — ABNORMAL LOW (ref >60)
Glucose, Bld: 355 mg/dL — ABNORMAL HIGH (ref 70–99)
Potassium: 4.2 mmol/L (ref 3.5–5.1)
Sodium: 132 mmol/L — ABNORMAL LOW (ref 135–145)

## 2022-01-29 LAB — BPAM RBC
Blood Product Expiration Date: 202311052359
Blood Product Expiration Date: 202311052359
ISSUE DATE / TIME: 202310091221
ISSUE DATE / TIME: 202310091221
Unit Type and Rh: 5100
Unit Type and Rh: 5100

## 2022-01-29 LAB — HEPARIN LEVEL (UNFRACTIONATED)
Heparin Unfractionated: >1.1 IU/mL — ABNORMAL HIGH (ref 0.30–0.70)
Heparin Unfractionated: 0.29 IU/mL — ABNORMAL LOW (ref 0.30–0.70)

## 2022-01-29 LAB — PREPARE RBC (CROSSMATCH)

## 2022-01-29 MED ORDER — HEPARIN (PORCINE) 25000 UT/250ML-% IV SOLN
1100.0000 [IU]/h | INTRAVENOUS | Status: DC
  Administered 2022-01-29: 1100 [IU]/h via INTRAVENOUS
  Filled 2022-01-29: qty 250

## 2022-01-29 MED ORDER — NOREPINEPHRINE 16 MG/250ML-% IV SOLN
0.0000 ug/min | INTRAVENOUS | Status: DC
  Administered 2022-01-29: 7 ug/min via INTRAVENOUS
  Filled 2022-01-29: qty 250

## 2022-01-29 MED ORDER — INSULIN GLARGINE-YFGN 100 UNIT/ML ~~LOC~~ SOLN
20.0000 [IU] | Freq: Every day | SUBCUTANEOUS | Status: DC
  Administered 2022-01-29: 20 [IU] via SUBCUTANEOUS
  Filled 2022-01-29 (×3): qty 0.2

## 2022-01-29 MED ORDER — INSULIN GLARGINE-YFGN 100 UNIT/ML ~~LOC~~ SOLN
5.0000 [IU] | Freq: Once | SUBCUTANEOUS | Status: AC
  Administered 2022-01-29: 5 [IU] via SUBCUTANEOUS
  Filled 2022-01-29: qty 0.05

## 2022-01-29 MED ORDER — ALBUMIN HUMAN 5 % IV SOLN
12.5000 g | Freq: Once | INTRAVENOUS | Status: AC
  Administered 2022-01-29: 12.5 g via INTRAVENOUS
  Filled 2022-01-29: qty 250

## 2022-01-29 MED ORDER — SODIUM CHLORIDE 0.9% IV SOLUTION: Freq: Once | INTRAVENOUS | Status: AC

## 2022-01-29 NOTE — Progress Notes (Signed)
[facility] Progress Note Patient Name: Cassandra Dodson DOB: 05/02/1940 MRN: [id number]   Date of Service  01/29/2022  HPI/Events of Note  Bedside RN concerned about possible atrial fibrillation and ordered an EKG.  eICU Interventions  EKG reviewed. Patient is in sinus rhythm.        Kerry Kass Genisis Sonnier 01/29/2022, 11:27 PM

## 2022-01-29 NOTE — Progress Notes (Signed)
Referring Physician(s):Dr. Ina Homes  Supervising Physician: Corrie Mckusick  Patient Status:  [facility] - In-pt  Chief Complaint:  Saddle PE, s/p thrombectomy with Dr. Vernard Gambles on 10/9   Subjective:  Laying in bed, intubated and sedated. RN at bedside, reports no further bleeding  today.  Patient received blood today and has shown some improvement.   Allergies: Anesthetics, amide and Ether  Medications: Prior to Admission medications   Medication Sig Start Date End Date Taking? Authorizing Provider  amLODipine (NORVASC) 5 MG tablet Take 1 tablet (5 mg total) by mouth daily. 01/18/22 02/17/22 Yes Pahwani, Einar Grad, MD  aspirin EC 81 MG tablet Take 1 tablet (81 mg total) by mouth daily for 16 days. Swallow whole. 01/18/22 02/03/22 Yes Pahwani, Einar Grad, MD  calcium carbonate (OS-CAL - DOSED IN MG OF ELEMENTAL CALCIUM) 1250 (500 Ca) MG tablet Take 1 tablet (1,250 mg total) by mouth daily with breakfast. 11/28/21  Yes Love, Ivan Anchors, PA-C  clopidogrel (PLAVIX) 75 MG tablet Take 1 tablet (75 mg total) by mouth daily. 11/27/21  Yes Love, Ivan Anchors, PA-C  docusate sodium (COLACE) 100 MG capsule Take 1 capsule (100 mg total) by mouth 2 (two) times daily. 11/27/21  Yes Love, Ivan Anchors, PA-C  fenofibrate 160 MG tablet Take 1 tablet (160 mg total) by mouth daily after breakfast. 11/27/21  Yes Love, Ivan Anchors, PA-C  folic acid (FOLVITE) 1 MG tablet Take 1 tablet (1 mg total) by mouth daily. 11/27/21  Yes Love, Ivan Anchors, PA-C  insulin glargine (LANTUS) 100 UNIT/ML Solostar Pen Inject 15 Units into the skin daily. 01/17/22  Yes Pahwani, Einar Grad, MD  insulin lispro (HUMALOG) 100 UNIT/ML KwikPen Inject 1 Units into the skin 3 (three) times daily with meals. 11/27/21  Yes Love, Ivan Anchors, PA-C  methylphenidate (RITALIN) 5 MG tablet Take 1 tablet (5 mg total) by mouth 2 (two) times daily with breakfast and lunch. 11/27/21  Yes Love, Ivan Anchors, PA-C  Mouthwashes (MOUTH RINSE) LIQD solution 15 mLs by Mouth Rinse route as needed (for oral  care). 11/05/21  Yes Sheikh, Omair Latif, DO  pantoprazole (PROTONIX) 40 MG tablet Take 1 tablet (40 mg total) by mouth daily. 11/27/21  Yes Love, Ivan Anchors, PA-C  rosuvastatin (CRESTOR) 20 MG tablet Take 1 tablet (20 mg total) by mouth daily. 11/27/21  Yes Love, Ivan Anchors, PA-C  senna (SENOKOT) 8.6 MG TABS tablet Take 13.2 mg by mouth at bedtime as needed for constipation. 12/18/21  Yes [provider]  traMADol (ULTRAM) 50 MG tablet Take 50 mg by mouth daily as needed for pain. 01/09/22  Yes [provider]  Zinc Oxide (DESITIN) 13 % CREA Apply 1 application  topically 2 (two) times daily. To buttocks for protection. 11/27/21  Yes Love, Ivan Anchors, PA-C  Insulin Pen Needle 32G X 4 MM MISC Use daily in the morning, at noon, and at bedtime. 11/27/21   Bary Leriche, PA-C     Vital Signs: BP 135/68   Pulse (!) 104   Temp 99.7 F (37.6 C) (Bladder)   Resp 17   Ht '5\' 3"'$  (1.6 m)   Wt 185 lb 3 oz (84 kg)   SpO2 100%   BMI 32.80 kg/m   Physical Exam Vitals reviewed.  Constitutional:      Appearance: She is not ill-appearing.     Comments: Intubated and sedated   HENT:     Head: Normocephalic.  Skin:    General: Skin is warm and dry.  Coloration: Skin is not pale.     Comments: Positive dressing on R CFV puncture site. Suture in place. Site is unremarkable with no erythema, edema, tenderness, bleeding or drainage. Minimal amount of old, dry blood noted on the dressing. Dressing otherwise clean, dry, and intact.       Imaging: IR THROMBECT PRIM MECH INIT (INCLU) MOD SED  Result Date: 01/28/2022 INDICATION: Submassive bilateral central pulmonary emboli with hypoxia RV strain, shortness of breath, tachycardia. Meningioma, subacute and chronic CVAs. Bilateral central pulmonary emboli. EXAM: BILATERAL PULMONARY ARTERIOGRAM RIGHT PULMONARY ARTERIAL THROMBECTOMY LEFT PULMONARY ARTERIAL THROMBECTOMY ULTRASOUND GUIDANCE FOR VASCULAR ACCESS COMPARISON:  CTA 01/26/2022 MEDICATIONS:  Lidocaine 1% subcutaneous, heparin 5000 units ANESTHESIA/SEDATION: Intubated, with monitored anesthesia care TECHNIQUE: Informed written consent was obtained from the son after a thorough discussion of the procedural risks, benefits and alternatives, as the patient was intubated. All questions were addressed. Maximal Sterile Barrier Technique was utilized including caps, mask, sterile gowns, sterile gloves, sterile drape, hand hygiene and skin antiseptic. A timeout was performed prior to the initiation of the procedure. Patency and complete compressibility of right common femoral vein was confirmed with ultrasound. After surgical prep, and local lidocaine administration, micropuncture access to right common femoral vein achieved, exchanged over a Bentson wire for a 7 [place] vascular sheath. 6 French angled pigtail catheter advanced into the right pulmonary artery. Pressure measurements obtained. Pulmonary arteriogram performed. Patient was heparinized with 5000 units heparin, and ACT was maintained greater than 200 throughout the case. Catheter exchanged for a 5 French vert catheter, negotiated into lower lobe branch right pulmonary artery, removed over a stiff Amplatz wire. Vascular sheath was upsized, and 24 French sheath placed, and coaxial 24 [place] FlowTriever device advanced into distal interlobar branch right pulmonary artery. Suction thrombectomy performed. Follow-up selective right pulmonary arteriography was obtained. The catheter was then redirected to the origin of the left pulmonary artery for left pulmonary arteriography. The 5 [place] vert catheter was coaxially advanced into a distal lower lobe branch, exchanged for FlowTriever device advanced into distal left pulmonary artery. Suction mechanical thrombectomy performed. Follow-up selective left pulmonary arteriogram obtained. Catheter was retracted into the distal main pulmonary artery for final pulmonary arteriography and pressure measurements.  Guidewire, catheter and sheath were removed and hemostasis achieved with aid of 0 silk pursestring suture. The patient tolerated the procedure well. Operators: Vernard Gambles, Mir FLUOROSCOPY TIME:  Radiation Exposure Index (as provided by the fluoroscopic device): 66.7 mGy air Kerma COMPLICATIONS: None immediate. FINDINGS: Initial right pulmonary arterial pressure  53/12 (27) mmHg. Selective right pulmonary arteriogram shows partially occlusive central embolus across its bifurcation with partially occlusive clot extending into the intralobar branch and lower lobe segmental branches. After right pulmonary arterial selective suction thrombectomy, there is clearance of the central clot with some residual partially occlusive mural thrombus in the interlobar branch, no significant residual large or occlusive clots. Selective left pulmonary arteriogram demonstrates partially occlusive thrombus in the proximal left pulmonary artery extending through its length. After left pulmonary artery selective suction thrombectomy, clearance of thrombus from the left pulmonary artery with improved branch perfusion, good homogeneous parenchymal enhancement of the left lung. Final pulmonary arterial pressure 38/10 (20) mmHg. IMPRESSION: 1. Successful bilateral pulmonary artery mechanical thrombectomy with improvement in pulmonary arterial pressure gradient. 2. Continue heparin . 3. Consider transition to oral anticoagulation. Electronically Signed   By: Lucrezia Europe M.D.   On: 01/28/2022 16:07   IR US Guide Vasc Access Right  Result Date: 01/28/2022 INDICATION: Submassive bilateral central pulmonary  emboli with hypoxia RV strain, shortness of breath, tachycardia. Meningioma, subacute and chronic CVAs. Bilateral central pulmonary emboli. EXAM: BILATERAL PULMONARY ARTERIOGRAM RIGHT PULMONARY ARTERIAL THROMBECTOMY LEFT PULMONARY ARTERIAL THROMBECTOMY ULTRASOUND GUIDANCE FOR VASCULAR ACCESS COMPARISON:  CTA 01/26/2022 MEDICATIONS: Lidocaine 1%  subcutaneous, heparin 5000 units ANESTHESIA/SEDATION: Intubated, with monitored anesthesia care TECHNIQUE: Informed written consent was obtained from the son after a thorough discussion of the procedural risks, benefits and alternatives, as the patient was intubated. All questions were addressed. Maximal Sterile Barrier Technique was utilized including caps, mask, sterile gowns, sterile gloves, sterile drape, hand hygiene and skin antiseptic. A timeout was performed prior to the initiation of the procedure. Patency and complete compressibility of right common femoral vein was confirmed with ultrasound. After surgical prep, and local lidocaine administration, micropuncture access to right common femoral vein achieved, exchanged over a Bentson wire for a 7 [place] vascular sheath. 6 French angled pigtail catheter advanced into the right pulmonary artery. Pressure measurements obtained. Pulmonary arteriogram performed. Patient was heparinized with 5000 units heparin, and ACT was maintained greater than 200 throughout the case. Catheter exchanged for a 5 French vert catheter, negotiated into lower lobe branch right pulmonary artery, removed over a stiff Amplatz wire. Vascular sheath was upsized, and 24 French sheath placed, and coaxial 24 [place] FlowTriever device advanced into distal interlobar branch right pulmonary artery. Suction thrombectomy performed. Follow-up selective right pulmonary arteriography was obtained. The catheter was then redirected to the origin of the left pulmonary artery for left pulmonary arteriography. The 5 [place] vert catheter was coaxially advanced into a distal lower lobe branch, exchanged for FlowTriever device advanced into distal left pulmonary artery. Suction mechanical thrombectomy performed. Follow-up selective left pulmonary arteriogram obtained. Catheter was retracted into the distal main pulmonary artery for final pulmonary arteriography and pressure measurements. Guidewire,  catheter and sheath were removed and hemostasis achieved with aid of 0 silk pursestring suture. The patient tolerated the procedure well. Operators: Vernard Gambles, Mir FLUOROSCOPY TIME:  Radiation Exposure Index (as provided by the fluoroscopic device): 66.7 mGy air Kerma COMPLICATIONS: None immediate. FINDINGS: Initial right pulmonary arterial pressure  53/12 (27) mmHg. Selective right pulmonary arteriogram shows partially occlusive central embolus across its bifurcation with partially occlusive clot extending into the intralobar branch and lower lobe segmental branches. After right pulmonary arterial selective suction thrombectomy, there is clearance of the central clot with some residual partially occlusive mural thrombus in the interlobar branch, no significant residual large or occlusive clots. Selective left pulmonary arteriogram demonstrates partially occlusive thrombus in the proximal left pulmonary artery extending through its length. After left pulmonary artery selective suction thrombectomy, clearance of thrombus from the left pulmonary artery with improved branch perfusion, good homogeneous parenchymal enhancement of the left lung. Final pulmonary arterial pressure 38/10 (20) mmHg. IMPRESSION: 1. Successful bilateral pulmonary artery mechanical thrombectomy with improvement in pulmonary arterial pressure gradient. 2. Continue heparin . 3. Consider transition to oral anticoagulation. Electronically Signed   By: Lucrezia Europe M.D.   On: 01/28/2022 16:07   IR THROMBECT PRIM MECH INIT (INCLU) MOD SED  Result Date: 01/28/2022 INDICATION: Submassive bilateral central pulmonary emboli with hypoxia RV strain, shortness of breath, tachycardia. Meningioma, subacute and chronic CVAs. Bilateral central pulmonary emboli. EXAM: BILATERAL PULMONARY ARTERIOGRAM RIGHT PULMONARY ARTERIAL THROMBECTOMY LEFT PULMONARY ARTERIAL THROMBECTOMY ULTRASOUND GUIDANCE FOR VASCULAR ACCESS COMPARISON:  CTA 01/26/2022 MEDICATIONS: Lidocaine  1% subcutaneous, heparin 5000 units ANESTHESIA/SEDATION: Intubated, with monitored anesthesia care TECHNIQUE: Informed written consent was obtained from the son after a thorough discussion of the procedural  risks, benefits and alternatives, as the patient was intubated. All questions were addressed. Maximal Sterile Barrier Technique was utilized including caps, mask, sterile gowns, sterile gloves, sterile drape, hand hygiene and skin antiseptic. A timeout was performed prior to the initiation of the procedure. Patency and complete compressibility of right common femoral vein was confirmed with ultrasound. After surgical prep, and local lidocaine administration, micropuncture access to right common femoral vein achieved, exchanged over a Bentson wire for a 7 [place] vascular sheath. 6 French angled pigtail catheter advanced into the right pulmonary artery. Pressure measurements obtained. Pulmonary arteriogram performed. Patient was heparinized with 5000 units heparin, and ACT was maintained greater than 200 throughout the case. Catheter exchanged for a 5 French vert catheter, negotiated into lower lobe branch right pulmonary artery, removed over a stiff Amplatz wire. Vascular sheath was upsized, and 24 French sheath placed, and coaxial 24 [place] FlowTriever device advanced into distal interlobar branch right pulmonary artery. Suction thrombectomy performed. Follow-up selective right pulmonary arteriography was obtained. The catheter was then redirected to the origin of the left pulmonary artery for left pulmonary arteriography. The 5 [place] vert catheter was coaxially advanced into a distal lower lobe branch, exchanged for FlowTriever device advanced into distal left pulmonary artery. Suction mechanical thrombectomy performed. Follow-up selective left pulmonary arteriogram obtained. Catheter was retracted into the distal main pulmonary artery for final pulmonary arteriography and pressure measurements. Guidewire,  catheter and sheath were removed and hemostasis achieved with aid of 0 silk pursestring suture. The patient tolerated the procedure well. Operators: Vernard Gambles, Mir FLUOROSCOPY TIME:  Radiation Exposure Index (as provided by the fluoroscopic device): 66.7 mGy air Kerma COMPLICATIONS: None immediate. FINDINGS: Initial right pulmonary arterial pressure  53/12 (27) mmHg. Selective right pulmonary arteriogram shows partially occlusive central embolus across its bifurcation with partially occlusive clot extending into the intralobar branch and lower lobe segmental branches. After right pulmonary arterial selective suction thrombectomy, there is clearance of the central clot with some residual partially occlusive mural thrombus in the interlobar branch, no significant residual large or occlusive clots. Selective left pulmonary arteriogram demonstrates partially occlusive thrombus in the proximal left pulmonary artery extending through its length. After left pulmonary artery selective suction thrombectomy, clearance of thrombus from the left pulmonary artery with improved branch perfusion, good homogeneous parenchymal enhancement of the left lung. Final pulmonary arterial pressure 38/10 (20) mmHg. IMPRESSION: 1. Successful bilateral pulmonary artery mechanical thrombectomy with improvement in pulmonary arterial pressure gradient. 2. Continue heparin . 3. Consider transition to oral anticoagulation. Electronically Signed   By: Lucrezia Europe M.D.   On: 01/28/2022 16:07   IR Angiogram Pulmonary Bilateral Selective  Result Date: 01/28/2022 INDICATION: Submassive bilateral central pulmonary emboli with hypoxia RV strain, shortness of breath, tachycardia. Meningioma, subacute and chronic CVAs. Bilateral central pulmonary emboli. EXAM: BILATERAL PULMONARY ARTERIOGRAM RIGHT PULMONARY ARTERIAL THROMBECTOMY LEFT PULMONARY ARTERIAL THROMBECTOMY ULTRASOUND GUIDANCE FOR VASCULAR ACCESS COMPARISON:  CTA 01/26/2022 MEDICATIONS: Lidocaine  1% subcutaneous, heparin 5000 units ANESTHESIA/SEDATION: Intubated, with monitored anesthesia care TECHNIQUE: Informed written consent was obtained from the son after a thorough discussion of the procedural risks, benefits and alternatives, as the patient was intubated. All questions were addressed. Maximal Sterile Barrier Technique was utilized including caps, mask, sterile gowns, sterile gloves, sterile drape, hand hygiene and skin antiseptic. A timeout was performed prior to the initiation of the procedure. Patency and complete compressibility of right common femoral vein was confirmed with ultrasound. After surgical prep, and local lidocaine administration, micropuncture access to right common femoral vein achieved,  exchanged over a Bentson wire for a 7 [place] vascular sheath. 6 French angled pigtail catheter advanced into the right pulmonary artery. Pressure measurements obtained. Pulmonary arteriogram performed. Patient was heparinized with 5000 units heparin, and ACT was maintained greater than 200 throughout the case. Catheter exchanged for a 5 French vert catheter, negotiated into lower lobe branch right pulmonary artery, removed over a stiff Amplatz wire. Vascular sheath was upsized, and 24 French sheath placed, and coaxial 24 [place] FlowTriever device advanced into distal interlobar branch right pulmonary artery. Suction thrombectomy performed. Follow-up selective right pulmonary arteriography was obtained. The catheter was then redirected to the origin of the left pulmonary artery for left pulmonary arteriography. The 5 [place] vert catheter was coaxially advanced into a distal lower lobe branch, exchanged for FlowTriever device advanced into distal left pulmonary artery. Suction mechanical thrombectomy performed. Follow-up selective left pulmonary arteriogram obtained. Catheter was retracted into the distal main pulmonary artery for final pulmonary arteriography and pressure measurements. Guidewire,  catheter and sheath were removed and hemostasis achieved with aid of 0 silk pursestring suture. The patient tolerated the procedure well. Operators: Vernard Gambles, Mir FLUOROSCOPY TIME:  Radiation Exposure Index (as provided by the fluoroscopic device): 66.7 mGy air Kerma COMPLICATIONS: None immediate. FINDINGS: Initial right pulmonary arterial pressure  53/12 (27) mmHg. Selective right pulmonary arteriogram shows partially occlusive central embolus across its bifurcation with partially occlusive clot extending into the intralobar branch and lower lobe segmental branches. After right pulmonary arterial selective suction thrombectomy, there is clearance of the central clot with some residual partially occlusive mural thrombus in the interlobar branch, no significant residual large or occlusive clots. Selective left pulmonary arteriogram demonstrates partially occlusive thrombus in the proximal left pulmonary artery extending through its length. After left pulmonary artery selective suction thrombectomy, clearance of thrombus from the left pulmonary artery with improved branch perfusion, good homogeneous parenchymal enhancement of the left lung. Final pulmonary arterial pressure 38/10 (20) mmHg. IMPRESSION: 1. Successful bilateral pulmonary artery mechanical thrombectomy with improvement in pulmonary arterial pressure gradient. 2. Continue heparin . 3. Consider transition to oral anticoagulation. Electronically Signed   By: Lucrezia Europe M.D.   On: 01/28/2022 16:07   IR Angiogram Selective Each Additional Vessel  Result Date: 01/28/2022 INDICATION: Submassive bilateral central pulmonary emboli with hypoxia RV strain, shortness of breath, tachycardia. Meningioma, subacute and chronic CVAs. Bilateral central pulmonary emboli. EXAM: BILATERAL PULMONARY ARTERIOGRAM RIGHT PULMONARY ARTERIAL THROMBECTOMY LEFT PULMONARY ARTERIAL THROMBECTOMY ULTRASOUND GUIDANCE FOR VASCULAR ACCESS COMPARISON:  CTA 01/26/2022 MEDICATIONS:  Lidocaine 1% subcutaneous, heparin 5000 units ANESTHESIA/SEDATION: Intubated, with monitored anesthesia care TECHNIQUE: Informed written consent was obtained from the son after a thorough discussion of the procedural risks, benefits and alternatives, as the patient was intubated. All questions were addressed. Maximal Sterile Barrier Technique was utilized including caps, mask, sterile gowns, sterile gloves, sterile drape, hand hygiene and skin antiseptic. A timeout was performed prior to the initiation of the procedure. Patency and complete compressibility of right common femoral vein was confirmed with ultrasound. After surgical prep, and local lidocaine administration, micropuncture access to right common femoral vein achieved, exchanged over a Bentson wire for a 7 [place] vascular sheath. 6 French angled pigtail catheter advanced into the right pulmonary artery. Pressure measurements obtained. Pulmonary arteriogram performed. Patient was heparinized with 5000 units heparin, and ACT was maintained greater than 200 throughout the case. Catheter exchanged for a 5 French vert catheter, negotiated into lower lobe branch right pulmonary artery, removed over a stiff Amplatz wire. Vascular sheath was upsized,  and 24 French sheath placed, and coaxial 24 [place] FlowTriever device advanced into distal interlobar branch right pulmonary artery. Suction thrombectomy performed. Follow-up selective right pulmonary arteriography was obtained. The catheter was then redirected to the origin of the left pulmonary artery for left pulmonary arteriography. The 5 [place] vert catheter was coaxially advanced into a distal lower lobe branch, exchanged for FlowTriever device advanced into distal left pulmonary artery. Suction mechanical thrombectomy performed. Follow-up selective left pulmonary arteriogram obtained. Catheter was retracted into the distal main pulmonary artery for final pulmonary arteriography and pressure measurements.  Guidewire, catheter and sheath were removed and hemostasis achieved with aid of 0 silk pursestring suture. The patient tolerated the procedure well. Operators: Vernard Gambles, Mir FLUOROSCOPY TIME:  Radiation Exposure Index (as provided by the fluoroscopic device): 66.7 mGy air Kerma COMPLICATIONS: None immediate. FINDINGS: Initial right pulmonary arterial pressure  53/12 (27) mmHg. Selective right pulmonary arteriogram shows partially occlusive central embolus across its bifurcation with partially occlusive clot extending into the intralobar branch and lower lobe segmental branches. After right pulmonary arterial selective suction thrombectomy, there is clearance of the central clot with some residual partially occlusive mural thrombus in the interlobar branch, no significant residual large or occlusive clots. Selective left pulmonary arteriogram demonstrates partially occlusive thrombus in the proximal left pulmonary artery extending through its length. After left pulmonary artery selective suction thrombectomy, clearance of thrombus from the left pulmonary artery with improved branch perfusion, good homogeneous parenchymal enhancement of the left lung. Final pulmonary arterial pressure 38/10 (20) mmHg. IMPRESSION: 1. Successful bilateral pulmonary artery mechanical thrombectomy with improvement in pulmonary arterial pressure gradient. 2. Continue heparin . 3. Consider transition to oral anticoagulation. Electronically Signed   By: Lucrezia Europe M.D.   On: 01/28/2022 16:07   IR Angiogram Selective Each Additional Vessel  Result Date: 01/28/2022 INDICATION: Submassive bilateral central pulmonary emboli with hypoxia RV strain, shortness of breath, tachycardia. Meningioma, subacute and chronic CVAs. Bilateral central pulmonary emboli. EXAM: BILATERAL PULMONARY ARTERIOGRAM RIGHT PULMONARY ARTERIAL THROMBECTOMY LEFT PULMONARY ARTERIAL THROMBECTOMY ULTRASOUND GUIDANCE FOR VASCULAR ACCESS COMPARISON:  CTA 01/26/2022  MEDICATIONS: Lidocaine 1% subcutaneous, heparin 5000 units ANESTHESIA/SEDATION: Intubated, with monitored anesthesia care TECHNIQUE: Informed written consent was obtained from the son after a thorough discussion of the procedural risks, benefits and alternatives, as the patient was intubated. All questions were addressed. Maximal Sterile Barrier Technique was utilized including caps, mask, sterile gowns, sterile gloves, sterile drape, hand hygiene and skin antiseptic. A timeout was performed prior to the initiation of the procedure. Patency and complete compressibility of right common femoral vein was confirmed with ultrasound. After surgical prep, and local lidocaine administration, micropuncture access to right common femoral vein achieved, exchanged over a Bentson wire for a 7 [place] vascular sheath. 6 French angled pigtail catheter advanced into the right pulmonary artery. Pressure measurements obtained. Pulmonary arteriogram performed. Patient was heparinized with 5000 units heparin, and ACT was maintained greater than 200 throughout the case. Catheter exchanged for a 5 French vert catheter, negotiated into lower lobe branch right pulmonary artery, removed over a stiff Amplatz wire. Vascular sheath was upsized, and 24 French sheath placed, and coaxial 24 [place] FlowTriever device advanced into distal interlobar branch right pulmonary artery. Suction thrombectomy performed. Follow-up selective right pulmonary arteriography was obtained. The catheter was then redirected to the origin of the left pulmonary artery for left pulmonary arteriography. The 5 [place] vert catheter was coaxially advanced into a distal lower lobe branch, exchanged for FlowTriever device advanced into distal left pulmonary artery. Suction  mechanical thrombectomy performed. Follow-up selective left pulmonary arteriogram obtained. Catheter was retracted into the distal main pulmonary artery for final pulmonary arteriography and pressure  measurements. Guidewire, catheter and sheath were removed and hemostasis achieved with aid of 0 silk pursestring suture. The patient tolerated the procedure well. Operators: Vernard Gambles, Mir FLUOROSCOPY TIME:  Radiation Exposure Index (as provided by the fluoroscopic device): 66.7 mGy air Kerma COMPLICATIONS: None immediate. FINDINGS: Initial right pulmonary arterial pressure  53/12 (27) mmHg. Selective right pulmonary arteriogram shows partially occlusive central embolus across its bifurcation with partially occlusive clot extending into the intralobar branch and lower lobe segmental branches. After right pulmonary arterial selective suction thrombectomy, there is clearance of the central clot with some residual partially occlusive mural thrombus in the interlobar branch, no significant residual large or occlusive clots. Selective left pulmonary arteriogram demonstrates partially occlusive thrombus in the proximal left pulmonary artery extending through its length. After left pulmonary artery selective suction thrombectomy, clearance of thrombus from the left pulmonary artery with improved branch perfusion, good homogeneous parenchymal enhancement of the left lung. Final pulmonary arterial pressure 38/10 (20) mmHg. IMPRESSION: 1. Successful bilateral pulmonary artery mechanical thrombectomy with improvement in pulmonary arterial pressure gradient. 2. Continue heparin . 3. Consider transition to oral anticoagulation. Electronically Signed   By: Lucrezia Europe M.D.   On: 01/28/2022 16:07   ECHOCARDIOGRAM LIMITED  Result Date: 01/26/2022    ECHOCARDIOGRAM LIMITED REPORT   Patient Name:   Cassandra Dodson Date of Exam: 01/26/2022 Medical Rec #:  [id number]    Height:       63.0 in Accession #:    [id number]   Weight:       229.5 lb Date of Birth:  1940-12-10    BSA:          2.050 m Patient Age:    21 years     BP:           108/70 mmHg Patient Gender: F            HR:           121 bpm. Exam Location:  Inpatient Procedure:  Limited Echo, Color Doppler and Cardiac Doppler STAT ECHO Indications:    pulmonary embolus  History:        Patient has prior history of Echocardiogram examinations. Cor                 pulmonale, chronic kidney disease; Risk Factors:Hypertension,                 Dyslipidemia and Diabetes.  Sonographer:    Johny Chess RDCS Referring Phys: [phone] MATTHEW R HUNSUCKER  Sonographer Comments: Technically difficult study due to poor echo windows and echo performed with patient supine and on artificial respirator. Image acquisition challenging due to respiratory motion. IMPRESSIONS  1. There is a suggestion of systolic anterior motion of the mitral valve, probably due to the hyperdynamic LV contractility and underfilled left ventricle. Doppler interrogation of LVOT gradients was not performed. Left ventricular ejection fraction, by  estimation, is >75%. The left ventricle has hyperdynamic function. The left ventricle has no regional wall motion abnormalities. The interventricular septum is flattened in systole and diastole, consistent with right ventricular pressure and volume overload.  2. Right ventricular systolic function is moderately reduced. The right ventricular size is moderately enlarged. There is severely elevated pulmonary artery systolic pressure.  3. Right atrial size was mildly dilated.  4. The mitral valve is normal in structure. No evidence  of mitral valve regurgitation.  5. The tricuspid valve is abnormal. Tricuspid valve regurgitation is moderate to severe.  6. The aortic valve is tricuspid. Aortic valve regurgitation is not visualized. Aortic valve sclerosis/calcification is present, without any evidence of aortic stenosis.  7. The inferior vena cava is dilated in size with <50% respiratory variability, suggesting right atrial pressure of 15 mmHg. FINDINGS  Left Ventricle: There is a suggestion of systolic anterior motion of the mitral valve, probably due to the hyperdynamic LV contractility and  underfilled left ventricle. Doppler interrogation of LVOT gradients was not performed. Left ventricular ejection  fraction, by estimation, is >75%. The left ventricle has hyperdynamic function. The left ventricle has no regional wall motion abnormalities. The left ventricular internal cavity size was small. The interventricular septum is flattened in systole and diastole, consistent with right ventricular pressure and volume overload. Right Ventricle: The right ventricular size is moderately enlarged. Right ventricular systolic function is moderately reduced. There is severely elevated pulmonary artery systolic pressure. The tricuspid regurgitant velocity is 4.11 m/s, and with an assumed right atrial pressure of 15 mmHg, the estimated right ventricular systolic pressure is 35.3 mmHg. Left Atrium: Left atrial size was normal in size. Right Atrium: Right atrial size was mildly dilated. Prominent Eustachian valve. Pericardium: There is no evidence of pericardial effusion. Mitral Valve: The mitral valve is normal in structure. Mild mitral annular calcification. Tricuspid Valve: The tricuspid valve is abnormal. Tricuspid valve regurgitation is moderate to severe. Aortic Valve: The noncoronary cusp is rigid and calcified. The aortic valve is tricuspid. Aortic valve regurgitation is not visualized. Aortic valve sclerosis/calcification is present, without any evidence of aortic stenosis. Pulmonic Valve: The pulmonic valve was grossly normal. Pulmonic valve regurgitation is mild. Venous: The inferior vena cava is dilated in size with less than 50% respiratory variability, suggesting right atrial pressure of 15 mmHg. IAS/Shunts: No atrial level shunt detected by color flow Doppler. RIGHT VENTRICLE             IVC RV S prime:     10.10 cm/s  IVC diam: 2.00 cm TAPSE (M-mode): 1.4 cm TRICUSPID VALVE TR Peak grad:   67.6 mmHg TR Vmax:        411.00 cm/s Dani Gobble Croitoru MD Electronically signed by Sanda Klein MD Signature  Date/Time: 01/26/2022/3:34:11 PM    Final    CT Head Wo Contrast  Result Date: 01/26/2022 CLINICAL DATA:  81 year old female with altered mental status, code sepsis, tachycardia, tachypnea, hyperglycemia. None meningioma. EXAM: CT HEAD WITHOUT CONTRAST TECHNIQUE: Contiguous axial images were obtained from the base of the skull through the vertex without intravenous contrast. RADIATION DOSE REDUCTION: This exam was performed according to the departmental dose-optimization program which includes automated exposure control, adjustment of the mA and/or kV according to patient size and/or use of iterative reconstruction technique. COMPARISON:  Brain MRI 01/13/2022.  Head CT 01/16/2022. 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 Regional mass effect is stable. Basilar cisterns remain patent. Streak artifact through the superior hemispheres today. No acute intracranial hemorrhage identified. No cortically based acute infarct identified. Fairly extensive chronic ischemic disease in the brainstem, cerebellum, left thalamus. Vascular: Extensive Calcified atherosclerosis at the skull base. Chronic basilar artery vascular stent. No suspicious intracranial vascular hyperdensity. Skull: No acute osseous abnormality identified. Sinuses/Orbits: Visualized paranasal sinuses and mastoids are stable and well aerated. Other: Layering fluid in the visible pharynx. The patient  is intubated on contemporary CTA chest. No acute orbit or scalp soft tissue finding. IMPRESSION: 1. Stable non contrast CT appearance of the brain, with large chronic left planum sphenoidale meningioma with associated regional edema and mass effect is stable from last month. 2. Advanced chronic ischemic disease.  Chronic Basilar Artery stent. 3.  No new intracranial abnormality. Electronically Signed   By: Genevie Ann M.D.   On:  01/26/2022 13:00   CT Angio Chest/Abd/Pel for Dissection W and/or Wo Contrast  Result Date: 01/26/2022 CLINICAL DATA:  81 year old female with altered mental status, code sepsis, tachycardia, tachypnea, hyperglycemia. EXAM: CT ANGIOGRAPHY CHEST, ABDOMEN AND PELVIS TECHNIQUE: Non-contrast CT of the chest was initially obtained. Multidetector CT imaging through the chest, abdomen and pelvis was performed using the standard protocol during bolus administration of intravenous contrast. Multiplanar reconstructed images and MIPs were obtained and reviewed to evaluate the vascular anatomy. RADIATION DOSE REDUCTION: This exam was performed according to the departmental dose-optimization program which includes automated exposure control, adjustment of the mA and/or kV according to patient size and/or use of iterative reconstruction technique. CONTRAST:  31m OMNIPAQUE IOHEXOL 350 MG/ML SOLN COMPARISON:  No prior chest CTA.  CT Abdomen and Pelvis 03/30/2019. FINDINGS: CTA CHEST FINDINGS Cardiovascular: Pulmonary artery saddle embolus. Bilateral lobar pulmonary artery thrombus. Abnormal RV LV ratio 1.5. Calcified aortic atherosclerosis. Calcified coronary artery atherosclerosis. No cardiomegaly or pericardial effusion. Mediastinum/Nodes: Negative. Enteric tube courses through the esophagus. Lungs/Pleura: Intubated. Endotracheal tube tip in good position above the carina. Major airways remain patent. Mild dependent opacity in the lungs is greater on the right and is enhancing more suggestive of atelectasis than pulmonary infarct. No pleural effusion. Musculoskeletal: No acute osseous abnormality identified. Review of the MIP images confirms the above findings. CTA ABDOMEN AND PELVIS FINDINGS VASCULAR Reflux of contrast to the hepatic IVC, hepatic veins, and also into the infrarenal IVC and pelvic veins. Extensive Aortoiliac calcified atherosclerosis. Negative for aortic dissection or aneurysm. Major arterial structures in  the abdomen and pelvis remain patent. There is a left femoral venous catheter in place. Review of the MIP images confirms the above findings. NON-VASCULAR Hepatobiliary: Contrast reflux into the liver which otherwise appears negative. Negative gallbladder. Pancreas: Partially atrophied. Spleen: Negative. Adrenals/Urinary Tract: Adrenal glands and kidneys appears stable and negative. Foley catheter is in place within the bladder, which is mostly obscured by pelvis hip arthroplasty artifact. Stomach/Bowel: Rectosigmoid retained stool. No dilated large or small bowel. Widespread large bowel diverticulosis. No free air or free fluid identified. Negative appendix visible on series 6, image 24. No convincing bowel inflammation. Enteric tube terminates in the distal gastric body. Lymphatic: No lymphadenopathy identified. Reproductive: Mostly obscured by hip arthroplasty streak artifact. Other: No pelvic free fluid. Musculoskeletal: Advanced lumbar spine degeneration. Chronic right hip arthroplasty. No acute osseous abnormality identified. Review of the MIP images confirms the above findings. Study discussed by telephone with Dr. KMatilde Sprangon 01/26/2022 at 12:48 . IMPRESSION: 1. Positive for Acute PE with Saddle Embolus and Right Heart Strain (RV/LV Ratio = 1.5) consistent with at least Submassive (intermediate risk) PE. The presence of right heart strain has been associated with an increased risk of morbidity and mortality. Please refer to the "Code PE Focused" order set in EPIC. Critical Value/emergent results were called by telephone at the time of interpretation on 01/26/2022 at at 1248 hours to Dr. KMatilde Sprang who verbally acknowledged these results. 2. No pleural effusion, and mild dependent lung opacity is more suggestive of atelectasis than pulmonary infarct. 3. No  acute or inflammatory process identified in the abdomen or pelvis. Widespread large bowel diverticulosis without evidence of acute diverticulitis. 4. Satisfactory  ET tube, enteric tube, Foley catheter placement. 5. Aortic Atherosclerosis (ICD10-I70.0). Electronically Signed   By: Genevie Ann M.D.   On: 01/26/2022 12:54   DG Chest Port 1 View  Result Date: 01/26/2022 CLINICAL DATA:  Intubation. EXAM: PORTABLE CHEST 1 VIEW COMPARISON:  01/26/2022 FINDINGS: An endotracheal tube with tip 4 cm above the carina, and enteric tube entering the stomach with tip off the field of view noted. Defibrillator/pacing pad overlying the chest now noted. The cardiomediastinal silhouette is unremarkable. There is no evidence of focal airspace disease, pulmonary edema, suspicious pulmonary nodule/mass, pleural effusion, or pneumothorax. No acute bony abnormalities are identified. IMPRESSION: Endotracheal and enteric tubes as described. No evidence of acute cardiopulmonary disease. Electronically Signed   By: Margarette Canada M.D.   On: 01/26/2022 10:07   DG Chest Port 1 View  Result Date: 01/26/2022 CLINICAL DATA:  Shortness of breath EXAM: PORTABLE CHEST 1 VIEW COMPARISON:  Multiple chest x-rays, most recently January 11, 2022 FINDINGS: Slightly widened mediastinal contours.  Normal heart size. No focal pulmonary opacity. No large pleural effusion or pneumothorax. The visualized upper abdomen is unremarkable. No acute osseous abnormality. IMPRESSION: Slightly widened mediastinal contours, which may be positional. However, if there is clinical concern for vascular injury, recommend further assessment with chest CT angiogram. Electronically Signed   By: Beryle Flock M.D.   On: 01/26/2022 09:37    Labs:  CBC: Recent Labs    01/27/22 0219 01/28/22 0208 01/28/22 1325 01/28/22 1859 01/29/22 0444  WBC 10.7* 13.1*  --  14.6* 15.2*  HGB 11.7* 10.0* 7.8* 8.5* 7.3*  HCT 35.9* 30.0* 23.0* 24.4* 21.1*  PLT 256 219  --  198 212    COAGS: Recent Labs    10/25/21 1540 10/30/21 0449 11/01/21 1127  INR 1.1 1.2 1.2  APTT 25  --   --     BMP: Recent Labs    01/26/22 1147  01/26/22 1151 01/26/22 1152 01/27/22 0219 01/28/22 0208 01/28/22 1325 01/29/22 0444  NA 133* 133*   < > 131* 133* 134* 132*  K 3.8 3.8   < > 4.5 4.3 4.2 4.2  CL 102 102  --  99 104  --  103  CO2 19*  --   --  20* 21*  --  21*  GLUCOSE 321* 323*  --  290* 193*  --  355*  BUN 17 17  --  24* 24*  --  33*  CALCIUM 9.3  --   --  9.8 9.4  --  8.4*  CREATININE 0.92 0.70  --  0.99 0.81  --  1.03*  GFRNONAA >60  --   --  57* >60  --  54*   < > = values in this interval not displayed.    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 Vernard Gambles.   Pt remains intubated  VS persistent tachycardia, BP stable on Levophed  Hgb 7.3 < 8.5 yesterday   RF stable  Suture removed from R CFA puncture site w/o difficulty today   CT AP wo today due to drop in hgb which showed: Distal colonic diverticulosis without evidence of diverticulitis.   Infiltrative changes at RIGHT inguinal region question soft tissue edema or scattered hemorrhage from prior arteriography, no discrete inguinal hematoma identified.   No intrapelvic or retroperitoneal hematoma seen.   Bibasilar atelectasis.   Extensive atherosclerotic disease changes.   Aortic Atherosclerosis (ICD10-I70.0).    Further treatment plan per PCCM Appreciate and agree with the plan.  Please call IR for questions and concerns.    Electronically Signed: Tera Mater, PA-C 01/29/2022, 11:25 AM   I spent a total of 15 Minutes at the the patient's bedside AND on the  patient's hospital floor or unit, greater than 50% of which was counseling/coordinating care for PE thrombectomy f/u.   This chart was dictated using voice recognition software.  Despite best efforts to proofread,  errors can occur which can change the documentation meaning.

## 2022-01-29 NOTE — Progress Notes (Signed)
ANTICOAGULATION CONSULT NOTE -Follow Up  Pharmacy Consult for heparin Indication: pulmonary embolus  Allergies  Allergen Reactions   Anesthetics, Amide Other (See Comments)    Pt is intolerant to general anesthesia. Pt will throw up and has thrown up during procedure.    Ether Nausea And Vomiting    Patient Measurements: Height: '5\' 3"'$  (160 cm) Weight: 84 kg (185 lb 3 oz) IBW/kg (Calculated) : 52.4 Heparin Dosing Weight: 77 kg  Vital Signs: Temp: 99.9 F (37.7 C) (10/10 1500) Temp Source: Bladder (10/10 1340) BP: 110/53 (10/10 1500) Pulse Rate: 98 (10/10 1500)  Labs: Recent Labs    01/27/22 0219 01/28/22 0208 01/28/22 1325 01/28/22 1859 01/29/22 0444 01/29/22 1438  HGB 11.7* 10.0* 7.8* 8.5* 7.3*  --   HCT 35.9* 30.0* 23.0* 24.4* 21.1*  --   PLT 256 219  --  198 212  --   HEPARINUNFRC  --   --   --   --  >1.10* 0.29*  CREATININE 0.99 0.81  --   --  1.03*  --      Estimated Creatinine Clearance: 43.2 mL/min (A) (by C-G formula based on SCr of 1.03 mg/dL (H)).   Medical History: Past Medical History:  Diagnosis Date   Arthritis    CKD (chronic kidney disease), stage III ([place])    patient denies   Depression    Diabetes mellitus without complication ([place])    Difficult intravenous access    Endometrial cancer ([place])    GERD (gastroesophageal reflux disease)    Headache    History of radiation therapy 11/04/2019-12/01/2019   Endometrial HDR; Dr. Gery Pray   Hypertension    Hypothyroidism    Neuromuscular disorder ([place])    neuropathy in feet   Osteoarthritis    PMB (postmenopausal bleeding)    PONV (postoperative nausea and vomiting)    severe nausea and vomiting after knee replacement 06-2014, did ok with 2018 knee replacement   Urinary frequency    Wears glasses      Assessment: 81 yo W with acute saddle PE with RHS 10/7 in the setting of meningioma and recent CVA. Patient was started on enoxaparin but had bleeding at CVC site s/p thrombi pad x3. No  anticoagulation prior to admission. Pharmacy consulted for heparin.  He is now s/p thrombectomy and heparin was restarted post procedure   10/10: -Patient was concern for bleeding and received CT abdomen scan that showed no no intrapelvic or retroperitoneal hematoma and concerns for soft tissue edema vs scattered hemorrhage. Patient's heparin drip was discontinued and repeat level checked to make sure she is clearing the lovenox dose from Sunday. Repeat level showed 0.29 although a heparin level and not LMWH level, does show she is down trending in the right direction. Hgb has decreased to 7.3 today and had no signs/symptoms of bleeding per RN, with exception of more bruising.  Goal of Therapy:  Heparin level 0.3-0.7 units/ml Monitor platelets by anticoagulation protocol: Yes   Plan:  -Plan to hold Anticoagulation at this time -f/u anticoagulation plan for PE  -Monitor for signs/symptoms of bleeding and CBC  Sandford Craze, PharmD. [facility] Acute Care PGY-1  01/29/2022 4:39 PM

## 2022-01-29 NOTE — Progress Notes (Signed)
NAME:  Cassandra Dodson, MRN:  [id number], DOB:  1941/03/02, LOS: 3 ADMISSION DATE:  01/26/2022, CONSULTATION DATE: 01/28/2022 REFERRING MD: EDP, CHIEF COMPLAINT: Altered mental status  History of Present Illness:  81 year old female with past medical history of meningioma and multiple CVAs most recently late 12/2021 admitted for altered mental status.  Patient found to have a pulmonary embolism.  Given altered mental status, patient required to be intubated.  Patient had low blood pressures requiring Levophed.  Patient was started on Lovenox for PE and admitted to ICU for further work-up.  Diabetes, meningioma, multiple CVAs  Pertinent  Medical History  Diabetes, meningioma, multiple CVAs  Significant Hospital Events: Including procedures, antibiotic start and stop dates in addition to other pertinent events   01/26/2022 brought to the ED with altered mental status, tachypnea, hypoxemia concerning for sepsis with septic shock.  CT angiogram shows saddle embolism.  Not a candidate for lytics or IR intervention due to meningioma and recent stroke.   01/27/2022-patient heparin held overnight given continuous bleeding from central venous catheter  Interim History / Subjective:  Overnight, patient did require pressors as blood pressure continued to go down. Patient is intubated and sedated on my exam. Patient is not able to follow any commands. Spoke to son at bedside and states that he patient is wheelchair bound at baseline but is able to communicate regularly. Patient also has very good upper body strength at baseline per son.    Objective   Blood pressure 112/61, pulse 82, temperature 99.9 F (37.7 C), temperature source Bladder, resp. rate 17, height '5\' 3"'$  (1.6 m), weight 84 kg, SpO2 100 %. PAP: (38-54)/(10-13) 38/10  Vent Mode: PSV FiO2 (%):  [30 %] 30 % Set Rate:  [28 bmp] 28 bmp Vt Set:  [410 mL] 410 mL PEEP:  [5 cmH20] 5 cmH20 Pressure Support:  [8 cmH20] 8 cmH20 Plateau Pressure:  [15  cmH20-16 cmH20] 16 cmH20   Intake/Output Summary (Last 24 hours) at 01/29/2022 1010 Last data filed at 01/29/2022 0800 Gross per 24 hour  Intake 2532.23 ml  Output 1201 ml  Net 1331.23 ml   Filed Weights   01/28/22 0400 01/28/22 0800 01/29/22 0453  Weight: 81.3 kg 85.3 kg 84 kg    Examination: General: Patient is sedated and intubated. Eyes: Right gaze noted.  Not able to track. Pupils equal and reactive. Pale conjunctive noted  Head: Normocephalic, atraumatic  Cardio: Tachycardic, regular rhythm.  No murmur rubs or gallops noted Pulmonary: Coarse lung sounds throughout Abdomen: Soft, with normoactive bowel sounds Neuro: Intubated and sedated.  Not able to follow commands.  Does open eyes spontaneously.  Can withdraw to pain. Extremities: Swelling noted to bilateral upper extremities with more left than right. Skin: Bruising noted throughout. Worsening bruising noted to left upper extremity. Left femoral line is not oozing as much as it was yesterday.  Wounds dressed.  Resolved Hospital Problem list     Assessment & Plan:  This is a 81 year old female with a past medical history of a meningioma, diabetes, and multiple CVAs who presented with altered mental status.  Patient found to have pulmonary embolism with very low blood pressures requiring pressors.  Patient admitted to the ICU started on Lovenox and pressors, and admitted for further work-up.  #Acute metabolic encephalopathy, likely secondary to saddle massive pulmonary embolism and severe sepsis Patient remains intubated.  Patient is status post mechanical thrombectomy yesterday.  Patient not requiring any sedation at this time. Patient will likely need some time  to wake up. Blood pressures were stable yesterday during the day, but patient required levophed overnight.  Urine culture negative for any growth at this time.  Treating empirically.  Patient is still not able to follow commands on my exam. Patient will need some time  to wake up at this time.  -Day 4 cefepime -Follow cultures -Consider mechanical thrombectomy today -Continue to wean sedation as appropriate -Switch to heparin   #Acute hypoxic respiratory failure likely secondary to above Patient is still requiring intubation given the patient is not able to follow commands.  Patient is not safe to protect her airway.  Patient satting well at 100% on her ventilator settings of PRVC, rate of 28, PEEP 5%.  Wean as tolerated. -SBT today  -Vap bundle in place -Wean as tolerated to keep O2 saturation greater than 92% -Pulmonary hygiene  #Blood loss anemia Hemoglobin this a.m. 7.3.  This likely is from blood loss from femoral site as well as IR procedure yesterday. Patient still has bruising throughout and has worsening bruising noted to left upper arm.  Plan will be to give 2 units of blood today.  We will continue to monitor.  If patient continues to require pressors and hemoglobin remains relatively unchanged, consider CT abdomen pelvis.  -Continue heparin -Obtain CT abdomen and pelvis  -Replete with 2 units of blood -Repeat CBC at 1400  #Type 2 diabetes mellitus Blood glucose ranging into the 200s-300s. This could be in the setting of tube feeds and can be in the setting of patient having received stress dose steroids initially. Patient is on 15 units long acting with sliding scale. Will plan to increase long acting  -Continue glucose monitoring -Increase to 20 units semglee at bedtime  -Sliding Scale insulin q4H -Continue Crestor 20 mg  #Meningioma Stable at this time.  No intervention needed. -Monitor  #Stage I sacral decubitus ulcer and heel ulcer #Present on admission Stable -Continue wound care  #Leukocytosis likely likely reactive WBC up to 15.2 this AM.  This could be reactive likely due to massive PE. Consider line infection as well.  -Continue to monitor  Best Practice (right click and "Reselect all SmartList Selections" daily)    Diet/type: Tube feeds  DVT prophylaxis: systemic heparin GI prophylaxis: PPI Lines: Central line left femoral  Foley:  Yes, and it is still needed Code Status:  full code Family (son) updated at bedside  Labs   CBC: Recent Labs  Lab 01/26/22 1147 01/26/22 1151 01/27/22 0219 01/28/22 0208 01/28/22 1325 01/28/22 1859 01/29/22 0444  WBC 20.1*  --  10.7* 13.1*  --  14.6* 15.2*  NEUTROABS 17.8*  --   --   --   --   --   --   HGB 12.0   < > 11.7* 10.0* 7.8* 8.5* 7.3*  HCT 38.0   < > 35.9* 30.0* 23.0* 24.4* 21.1*  MCV 96.4  --  92.8 91.5  --  88.7 89.4  PLT 246  --  256 219  --  198 212   < > = values in this interval not displayed.    Basic Metabolic Panel: Recent Labs  Lab 01/26/22 1147 01/26/22 1151 01/26/22 1152 01/27/22 0219 01/28/22 0208 01/28/22 1325 01/29/22 0444  NA 133* 133* 133* 131* 133* 134* 132*  K 3.8 3.8 3.8 4.5 4.3 4.2 4.2  CL 102 102  --  99 104  --  103  CO2 19*  --   --  20* 21*  --  21*  GLUCOSE 321* 323*  --  290* 193*  --  355*  BUN 17 17  --  24* 24*  --  33*  CREATININE 0.92 0.70  --  0.99 0.81  --  1.03*  CALCIUM 9.3  --   --  9.8 9.4  --  8.4*  MG  --   --   --  1.7 1.9  --   --   PHOS  --   --   --  3.6 2.4*  --   --    GFR: Estimated Creatinine Clearance: 43.2 mL/min (A) (by C-G formula based on SCr of 1.03 mg/dL (H)). Recent Labs  Lab 01/26/22 1147 01/26/22 1452 01/27/22 0219 01/27/22 0951 01/28/22 0208 01/28/22 1859 01/29/22 0444  WBC 20.1*  --  10.7*  --  13.1* 14.6* 15.2*  LATICACIDVEN 2.4* 2.8*  --  2.4*  --   --   --     Liver Function Tests: Recent Labs  Lab 01/26/22 1147  AST 23  ALT 16  ALKPHOS 93  BILITOT 1.1  PROT 5.9*  ALBUMIN 2.7*   No results for input(s): "LIPASE", "AMYLASE" in the last 168 hours. No results for input(s): "AMMONIA" in the last 168 hours.  ABG    Component Value Date/Time   PHART 7.277 (L) 01/28/2022 1325   PCO2ART 53.2 (H) 01/28/2022 1325   PO2ART 422 (H) 01/28/2022 1325   HCO3  25.1 01/28/2022 1325   TCO2 27 01/28/2022 1325   ACIDBASEDEF 2.0 01/28/2022 1325   O2SAT 100 01/28/2022 1325     Coagulation Profile: No results for input(s): "INR", "PROTIME" in the last 168 hours.  Cardiac Enzymes: No results for input(s): "CKTOTAL", "CKMB", "CKMBINDEX", "TROPONINI" in the last 168 hours.  HbA1C: Hemoglobin A1C  Date/Time Value Ref Range Status  01/23/2015 12:00 AM 7.6  Final   Hgb A1c MFr Bld  Date/Time Value Ref Range Status  01/16/2022 08:52 AM 6.0 (H) 4.8 - 5.6 % Final    Comment:    (NOTE) Pre diabetes:          5.7%-6.4%  Diabetes:              >6.4%  Glycemic control for   <7.0% adults with diabetes   10/26/2021 02:25 AM 7.0 (H) 4.8 - 5.6 % Final    Comment:    (NOTE) Pre diabetes:          5.7%-6.4%  Diabetes:              >6.4%  Glycemic control for   <7.0% adults with diabetes     CBG: Recent Labs  Lab 01/28/22 1530 01/28/22 1924 01/28/22 2316 01/29/22 0321 01/29/22 0819  GLUCAP 104* 207* 267* 322* 275*    Review of Systems:   Negative except for what is stated in the HPI.   Past Medical History:  She,  has a past medical history of Arthritis, CKD (chronic kidney disease), stage III ([place]), Depression, Diabetes mellitus without complication ([place]), Difficult intravenous access, Endometrial cancer ([place]), GERD (gastroesophageal reflux disease), Headache, History of radiation therapy (11/04/2019-12/01/2019), Hypertension, Hypothyroidism, Neuromuscular disorder ([place]), Osteoarthritis, PMB (postmenopausal bleeding), PONV (postoperative nausea and vomiting), Urinary frequency, and Wears glasses.   Surgical History:   Past Surgical History:  Procedure Laterality Date   BREAST SURGERY     cyst removed   CESAREAN SECTION     DILATION AND CURETTAGE OF UTERUS N/A 06/24/2019   Procedure: DILATATION AND CURETTAGE;  Surgeon: Lafonda Mosses, MD;  Location:  [facility];  Service: Gynecology;  Laterality: N/A;   FRACTURE  SURGERY     right knee   INTRAUTERINE DEVICE (IUD) INSERTION N/A 06/24/2019   Procedure: INTRAUTERINE DEVICE (IUD) INSERTION MIRENA;  Surgeon: Lafonda Mosses, MD;  Location[facility];  Service: Gynecology;  Laterality: N/A;   IR ANGIO INTRA EXTRACRAN SEL COM CAROTID INNOMINATE UNI R MOD SED  11/05/2021   IR ANGIO VERTEBRAL SEL VERTEBRAL UNI R MOD SED  11/05/2021   IR ANGIOGRAM PULMONARY BILATERAL SELECTIVE  01/28/2022   IR ANGIOGRAM SELECTIVE EACH ADDITIONAL VESSEL  01/28/2022   IR ANGIOGRAM SELECTIVE EACH ADDITIONAL VESSEL  01/28/2022   IR CT HEAD LTD  11/01/2021   IR INTRA CRAN STENT  11/01/2021   IR RADIOLOGIST EVAL & MGMT  12/19/2021   IR THROMBECT PRIM MECH INIT (INCLU) MOD SED  01/28/2022   IR THROMBECT PRIM MECH INIT (INCLU) MOD SED  01/28/2022   IR US GUIDE VASC ACCESS RIGHT  11/01/2021   IR US GUIDE VASC ACCESS RIGHT  01/28/2022   IRRIGATION AND DEBRIDEMENT SEBACEOUS CYST     JOINT REPLACEMENT Right    right   LYMPH NODE DISSECTION N/A 09/14/2019   Procedure: LYMPH NODE DISSECTION;  Surgeon: Lafonda Mosses, MD;  Location: [facility];  Service: Gynecology;  Laterality: N/A;   RADIOLOGY WITH ANESTHESIA N/A 11/01/2021   Procedure: ANGIOPLASTY/STENT;  Surgeon: Luanne Bras, MD;  Location: [place];  Service: Radiology;  Laterality: N/A;   RADIOLOGY WITH ANESTHESIA N/A 01/28/2022   Procedure: IR WITH ANESTHESIA;  Surgeon: Radiologist, Medication, MD;  Location: [facility];  Service: Radiology;  Laterality: N/A;   ROBOTIC ASSISTED TOTAL HYSTERECTOMY WITH BILATERAL SALPINGO OOPHERECTOMY Bilateral 09/14/2019   Procedure: XI ROBOTIC ASSISTED TOTAL HYSTERECTOMY WITH BILATERAL SALPINGO OOPHORECTOMY;  Surgeon: Lafonda Mosses, MD;  Location: [facility];  Service: Gynecology;  Laterality: Bilateral;   SENTINEL NODE BIOPSY N/A 09/14/2019   Procedure: SENTINEL NODE BIOPSY;  Surgeon: Lafonda Mosses, MD;  Location: [facility];  Service: Gynecology;  Laterality: N/A;   teeth extration      TONSILLECTOMY     TOTAL HIP ARTHROPLASTY Right 02/23/2015   Procedure: RIGHT TOTAL HIP ARTHROPLASTY ANTERIOR APPROACH;  Surgeon: Leandrew Koyanagi, MD;  Location: [facility];  Service: Orthopedics;  Laterality: Right;   TOTAL KNEE ARTHROPLASTY Left 06/27/2016   Procedure: LEFT TOTAL KNEE ARTHROPLASTY;  Surgeon: Leandrew Koyanagi, MD;  Location: [facility];  Service: Orthopedics;  Laterality: Left;     Social History:   reports that she quit smoking about 19 years ago. Her smoking use included cigarettes. She has a 67.50 pack-year smoking history. She has never used smokeless tobacco. She reports current alcohol use. She reports that she does not use drugs.   Family History:  Her family history includes Hypertension in her father; Pancreatic cancer in her mother; Stroke in her father. There is no history of Colon cancer, Breast cancer, Ovarian cancer, Endometrial cancer, or Prostate cancer.   Allergies Allergies  Allergen Reactions   Anesthetics, Amide Other (See Comments)    Pt is intolerant to general anesthesia. Pt will throw up and has thrown up during procedure.    Ether Nausea And Vomiting     Home Medications  Prior to Admission medications   Medication Sig Start Date End Date Taking? Authorizing Provider  amLODipine (NORVASC) 5 MG tablet Take 1 tablet (5 mg total) by mouth daily. 01/18/22 02/17/22 Yes Darliss Cheney, MD  aspirin EC 81 MG  tablet Take 1 tablet (81 mg total) by mouth daily for 16 days. Swallow whole. 01/18/22 02/03/22 Yes Pahwani, Einar Grad, MD  calcium carbonate (OS-CAL - DOSED IN MG OF ELEMENTAL CALCIUM) 1250 (500 Ca) MG tablet Take 1 tablet (1,250 mg total) by mouth daily with breakfast. 11/28/21  Yes Love, Ivan Anchors, PA-C  clopidogrel (PLAVIX) 75 MG tablet Take 1 tablet (75 mg total) by mouth daily. 11/27/21  Yes Love, Ivan Anchors, PA-C  docusate sodium (COLACE) 100 MG capsule Take 1 capsule (100 mg total) by mouth 2 (two) times daily. 11/27/21  Yes Love, Ivan Anchors, PA-C  fenofibrate 160 MG tablet Take 1  tablet (160 mg total) by mouth daily after breakfast. 11/27/21  Yes Love, Ivan Anchors, PA-C  folic acid (FOLVITE) 1 MG tablet Take 1 tablet (1 mg total) by mouth daily. 11/27/21  Yes Love, Ivan Anchors, PA-C  insulin glargine (LANTUS) 100 UNIT/ML Solostar Pen Inject 15 Units into the skin daily. 01/17/22  Yes Pahwani, Einar Grad, MD  insulin lispro (HUMALOG) 100 UNIT/ML KwikPen Inject 1 Units into the skin 3 (three) times daily with meals. 11/27/21  Yes Love, Ivan Anchors, PA-C  methylphenidate (RITALIN) 5 MG tablet Take 1 tablet (5 mg total) by mouth 2 (two) times daily with breakfast and lunch. 11/27/21  Yes Love, Ivan Anchors, PA-C  Mouthwashes (MOUTH RINSE) LIQD solution 15 mLs by Mouth Rinse route as needed (for oral care). 11/05/21  Yes Sheikh, Omair Latif, DO  pantoprazole (PROTONIX) 40 MG tablet Take 1 tablet (40 mg total) by mouth daily. 11/27/21  Yes Love, Ivan Anchors, PA-C  rosuvastatin (CRESTOR) 20 MG tablet Take 1 tablet (20 mg total) by mouth daily. 11/27/21  Yes Love, Ivan Anchors, PA-C  senna (SENOKOT) 8.6 MG TABS tablet Take 13.2 mg by mouth at bedtime as needed for constipation. 12/18/21  Yes [provider]  traMADol (ULTRAM) 50 MG tablet Take 50 mg by mouth daily as needed for pain. 01/09/22  Yes [provider]  Zinc Oxide (DESITIN) 13 % CREA Apply 1 application  topically 2 (two) times daily. To buttocks for protection. 11/27/21  Yes Love, Ivan Anchors, PA-C  Insulin Pen Needle 32G X 4 MM MISC Use daily in the morning, at noon, and at bedtime. 11/27/21   Bary Leriche, PA-C     Critical care time: [place], DO Internal Medicine Resident PGY-1 Pager: [phone]

## 2022-01-29 NOTE — IPAL (Signed)
  Interdisciplinary Goals of Care Family Meeting   Date carried out: 01/29/2022  Location of the meeting: Bedside  Member's involved: Physician, Bedside Registered Nurse, and Family Member or next of kin  Durable Power of Attorney or acting medical decision maker: Son    Discussion: We discussed goals of care for Cassandra Dodson .  Discussed how she has multiple severe comorbidities and what she would want should she be nearing end of life.  Son would like all measures done and he will decide if and when she comes off life support.  Code status: Full Code  Disposition: Continue current acute care  Time spent for the meeting: 12 mins    Candee Furbish, MD  01/29/2022, 5:25 PM

## 2022-01-29 NOTE — Progress Notes (Signed)
Inpatient Diabetes Program Recommendations  AACE/ADA: New Consensus Statement on Inpatient Glycemic Control (2015)  Target Ranges:  Prepandial:   less than 140 mg/dL      Peak postprandial:   less than 180 mg/dL (1-2 hours)      Critically ill patients:  140 - 180 mg/dL   Lab Results  Component Value Date   GLUCAP 275 (H) 01/29/2022   HGBA1C 6.0 (H) 01/16/2022    Review of Glycemic Control  Latest Reference Range & Units 01/28/22 11:16 01/28/22 15:30 01/28/22 19:24 01/28/22 23:16 01/29/22 03:21 01/29/22 08:19  Glucose-Capillary 70 - 99 mg/dL 91 104 (H) 207 (H) 267 (H) 322 (H) 275 (H)   Diabetes history: DM  Outpatient Diabetes medications:  Lantus 15 units daily, Humalog 1 units tid with meals Current orders for Inpatient glycemic control:  Novolog 0-15 units q 4 hours Vital 65 ml/hr Semglee 20 units q HS  Inpatient Diabetes Program Recommendations:    Note that patient received 10 mg Decadron yesterday at 1310. This likely increased blood sugars.  Tube feeds also added.  Consider adding Novolog 3 units q 4 hours for tube feed coverage.  Anticipate that blood sugars will improve.   Thanks,  Adah Perl, RN, BC-ADM Inpatient Diabetes Coordinator Pager [phone]  (8a-5p)

## 2022-01-29 NOTE — Progress Notes (Signed)
[facility] Progress Note Patient Name: Cassandra Dodson DOB: [birth year] MRN: [id number]   Date of Service  01/29/2022  HPI/Events of Note  Patient with oliguria, most recent BNP is 140, albumin 2.7.  eICU Interventions  Albumin 250 ml of 5 % iv bolus x 1.        Kerry Kass Gaudencio Chesnut 01/29/2022, 5:32 AM

## 2022-01-29 NOTE — Progress Notes (Signed)
[place] for heparin Indication: pulmonary embolus  Allergies  Allergen Reactions   Anesthetics, Amide Other (See Comments)    Pt is intolerant to general anesthesia. Pt will throw up and has thrown up during procedure.    Ether Nausea And Vomiting    Patient Measurements: Height: '5\' 3"'$  (160 cm) Weight: 84 kg (185 lb 3 oz) IBW/kg (Calculated) : 52.4 Heparin Dosing Weight: 77 kg  Vital Signs: Temp: 100 F (37.8 C) (10/10 0500) Temp Source: Bladder (10/09 2000) BP: 134/118 (10/10 0500) Pulse Rate: 89 (10/10 0500)  Labs: Recent Labs    01/26/22 1147 01/26/22 1151 01/26/22 1152 01/27/22 0219 01/28/22 0208 01/28/22 1325 01/28/22 1859 01/29/22 0444  HGB 12.0 12.6   < > 11.7* 10.0* 7.8* 8.5* 7.3*  HCT 38.0 37.0   < > 35.9* 30.0* 23.0* 24.4* 21.1*  PLT 246  --   --  256 219  --  198 212  HEPARINUNFRC  --   --   --   --   --   --   --  >1.10*  CREATININE 0.92 0.70  --  0.99 0.81  --   --   --   TROPONINIHS 53*  --   --   --   --   --   --   --    < > = values in this interval not displayed.     Estimated Creatinine Clearance: 54.9 mL/min (by C-G formula based on SCr of 0.81 mg/dL).   Medical History: Past Medical History:  Diagnosis Date   Arthritis    CKD (chronic kidney disease), stage III ([place])    patient denies   Depression    Diabetes mellitus without complication ([place])    Difficult intravenous access    Endometrial cancer ([place])    GERD (gastroesophageal reflux disease)    Headache    History of radiation therapy 11/04/2019-12/01/2019   Endometrial HDR; Dr. Gery Pray   Hypertension    Hypothyroidism    Neuromuscular disorder ([place])    neuropathy in feet   Osteoarthritis    PMB (postmenopausal bleeding)    PONV (postoperative nausea and vomiting)    severe nausea and vomiting after knee replacement 06-2014, did ok with 2018 knee replacement   Urinary frequency    Wears glasses      Assessment: 81 yo W with  acute saddle PE with RHS 10/7 in the setting of meningioma and recent CVA. Patient was started on enoxaparin but had bleeding at CVC site s/p thrombi pad x3. No anticoagulation prior to admission. Pharmacy consulted for heparin.  He is now s/p thrombectomy and heparin was restarted post procedure -hg= 8.5 (has been 10-12)   10/10 AM update:  Heparin level elevated Hgb 7.3, no bleeding currently per RN  Goal of Therapy:  Heparin level 0.3-0.7 units/ml Monitor platelets by anticoagulation protocol: Yes   Plan:  -Hold heparin x 1 hr -Re-start heparin at 1100 units/hr -1400 heparin level  Narda Bonds, PharmD, BCPS Clinical Pharmacist Phone: [phone]

## 2022-01-30 ENCOUNTER — Inpatient Hospital Stay (HOSPITAL_COMMUNITY): Payer: Medicare Other

## 2022-01-30 ENCOUNTER — Inpatient Hospital Stay: Payer: Self-pay

## 2022-01-30 DIAGNOSIS — I2602 Saddle embolus of pulmonary artery with acute cor pulmonale: Secondary | ICD-10-CM | POA: Diagnosis not present

## 2022-01-30 LAB — TYPE AND SCREEN
ABO/RH(D): O POS
Antibody Screen: NEGATIVE
Unit division: 0
Unit division: 0

## 2022-01-30 LAB — GLUCOSE, CAPILLARY
Glucose-Capillary: 178 mg/dL — ABNORMAL HIGH (ref 70–99)
Glucose-Capillary: 241 mg/dL — ABNORMAL HIGH (ref 70–99)
Glucose-Capillary: 222 mg/dL — ABNORMAL HIGH (ref 70–99)
Glucose-Capillary: 224 mg/dL — ABNORMAL HIGH (ref 70–99)
Glucose-Capillary: 218 mg/dL — ABNORMAL HIGH (ref 70–99)

## 2022-01-30 LAB — BPAM RBC
Blood Product Expiration Date: 202311132359
Blood Product Expiration Date: 202311132359
ISSUE DATE / TIME: 202310100933
ISSUE DATE / TIME: 202310101212
Unit Type and Rh: 5100
Unit Type and Rh: 5100

## 2022-01-30 LAB — ECHOCARDIOGRAM LIMITED
Area-P 1/2: 3.85 cm2
Height: 63 in
S' Lateral: 2.3 cm
Weight: 2962.98 oz

## 2022-01-30 LAB — CBC
HCT: 24.3 % — ABNORMAL LOW (ref 36.0–46.0)
Hemoglobin: 8.5 g/dL — ABNORMAL LOW (ref 12.0–15.0)
MCH: 29.8 pg (ref 26.0–34.0)
MCHC: 35 g/dL (ref 30.0–36.0)
MCV: 85.3 fL (ref 80.0–100.0)
Platelets: 122 10*3/uL — ABNORMAL LOW (ref 150–400)
RBC: 2.85 MIL/uL — ABNORMAL LOW (ref 3.87–5.11)
RDW: 14.9 % (ref 11.5–15.5)
WBC: 11.6 10*3/uL — ABNORMAL HIGH (ref 4.0–10.5)
nRBC: 1 % — ABNORMAL HIGH (ref 0.0–0.2)

## 2022-01-30 LAB — COMPREHENSIVE METABOLIC PANEL
ALT: 160 U/L — ABNORMAL HIGH (ref 0–44)
AST: 75 U/L — ABNORMAL HIGH (ref 15–41)
Albumin: 2.1 g/dL — ABNORMAL LOW (ref 3.5–5.0)
Alkaline Phosphatase: 63 U/L (ref 38–126)
Anion gap: 6 (ref 5–15)
BUN: 35 mg/dL — ABNORMAL HIGH (ref 8–23)
CO2: 24 mmol/L (ref 22–32)
Calcium: 8.1 mg/dL — ABNORMAL LOW (ref 8.9–10.3)
Chloride: 104 mmol/L (ref 98–111)
Creatinine, Ser: 0.95 mg/dL (ref 0.44–1.00)
GFR, Estimated: 60 mL/min — ABNORMAL LOW (ref >60)
Glucose, Bld: 243 mg/dL — ABNORMAL HIGH (ref 70–99)
Potassium: 3.7 mmol/L (ref 3.5–5.1)
Sodium: 134 mmol/L — ABNORMAL LOW (ref 135–145)
Total Bilirubin: 0.6 mg/dL (ref 0.3–1.2)
Total Protein: 4.3 g/dL — ABNORMAL LOW (ref 6.5–8.1)

## 2022-01-30 LAB — HEPARIN LEVEL (UNFRACTIONATED)
Heparin Unfractionated: <0.1 IU/mL — ABNORMAL LOW (ref 0.30–0.70)
Heparin Unfractionated: 0.35 IU/mL (ref 0.30–0.70)

## 2022-01-30 LAB — PROCALCITONIN: Procalcitonin: 0.31 ng/mL

## 2022-01-30 MED ORDER — FUROSEMIDE 10 MG/ML IJ SOLN
40.0000 mg | Freq: Once | INTRAMUSCULAR | Status: AC
  Administered 2022-01-30: 40 mg via INTRAVENOUS
  Filled 2022-01-30: qty 4

## 2022-01-30 MED ORDER — SODIUM CHLORIDE 0.9% FLUSH
10.0000 mL | INTRAVENOUS | Status: DC | PRN
  Administered 2022-02-02 – 2022-02-08 (×4): 10 mL

## 2022-01-30 MED ORDER — DEXTROSE 10 % IV SOLN: INTRAVENOUS | Status: DC

## 2022-01-30 MED ORDER — INSULIN ASPART 100 UNIT/ML IJ SOLN
3.0000 [IU] | INTRAMUSCULAR | Status: DC
  Administered 2022-01-30 – 2022-01-31 (×6): 3 [IU] via SUBCUTANEOUS

## 2022-01-30 MED ORDER — MIDODRINE HCL 5 MG PO TABS
10.0000 mg | ORAL_TABLET | Freq: Three times a day (TID) | ORAL | Status: DC
  Administered 2022-01-30 – 2022-01-31 (×3): 10 mg
  Filled 2022-01-30 (×3): qty 2

## 2022-01-30 MED ORDER — POTASSIUM CHLORIDE 20 MEQ PO PACK
40.0000 meq | PACK | Freq: Two times a day (BID) | ORAL | Status: AC
  Administered 2022-01-30 (×2): 40 meq
  Filled 2022-01-30 (×2): qty 2

## 2022-01-30 MED ORDER — POTASSIUM CHLORIDE 20 MEQ PO PACK
40.0000 meq | PACK | Freq: Once | ORAL | Status: AC
  Administered 2022-01-30: 40 meq
  Filled 2022-01-30: qty 2

## 2022-01-30 MED ORDER — HEPARIN (PORCINE) 25000 UT/250ML-% IV SOLN
1000.0000 [IU]/h | INTRAVENOUS | Status: DC
  Administered 2022-01-30 – 2022-01-31 (×2): 1000 [IU]/h via INTRAVENOUS
  Filled 2022-01-30 (×2): qty 250

## 2022-01-30 MED ORDER — POLYETHYLENE GLYCOL 3350 17 G PO PACK: 17.0000 g | PACK | Freq: Every day | ORAL | Status: DC | PRN

## 2022-01-30 MED ORDER — POLYETHYLENE GLYCOL 3350 17 G PO PACK
17.0000 g | PACK | Freq: Two times a day (BID) | ORAL | Status: DC
  Administered 2022-01-30 – 2022-02-04 (×5): 17 g
  Filled 2022-01-30 (×8): qty 1

## 2022-01-30 MED ORDER — FENTANYL CITRATE PF 50 MCG/ML IJ SOSY
25.0000 ug | PREFILLED_SYRINGE | INTRAMUSCULAR | Status: DC | PRN
  Administered 2022-01-30: 50 ug via INTRAVENOUS
  Filled 2022-01-30: qty 1

## 2022-01-30 MED ORDER — SENNA 8.6 MG PO TABS
2.0000 | ORAL_TABLET | Freq: Every day | ORAL | Status: DC
  Administered 2022-01-30 – 2022-02-04 (×3): 17.2 mg
  Filled 2022-01-30 (×4): qty 2

## 2022-01-30 MED ORDER — INSULIN GLARGINE-YFGN 100 UNIT/ML ~~LOC~~ SOLN
30.0000 [IU] | Freq: Every day | SUBCUTANEOUS | Status: DC
  Administered 2022-01-30: 30 [IU] via SUBCUTANEOUS
  Filled 2022-01-30 (×2): qty 0.3

## 2022-01-30 MED ORDER — SODIUM CHLORIDE 0.9% FLUSH
10.0000 mL | Freq: Two times a day (BID) | INTRAVENOUS | Status: DC
  Administered 2022-01-30 – 2022-02-06 (×15): 10 mL
  Administered 2022-02-07: 20 mL
  Administered 2022-02-07 – 2022-02-16 (×16): 10 mL

## 2022-01-30 NOTE — Progress Notes (Signed)
Peripherally Inserted Central Catheter Placement  The IV Nurse has discussed with the patient and/or persons authorized to consent for the patient, the purpose of this procedure and the potential benefits and risks involved with this procedure.  The benefits include less needle sticks, lab draws from the catheter, and the patient may be discharged home with the catheter. Risks include, but not limited to, infection, bleeding, blood clot (thrombus formation), and puncture of an artery; nerve damage and irregular heartbeat and possibility to perform a PICC exchange if needed/ordered by physician.  Alternatives to this procedure were also discussed.  Bard Power PICC patient education guide, fact sheet on infection prevention and patient information card has been provided to patient /or left at bedside.    PICC Placement Documentation  PICC Triple Lumen 04/59/97 Right Basilic 41 cm 0 cm (Active)  Indication for Insertion or Continuance of Line Vasoactive infusions 01/30/22 1445  Exposed Catheter (cm) 0 cm 01/30/22 1445  Site Assessment Clean, Dry, Intact 01/30/22 1445  Lumen #1 Status Flushed;Saline locked;Blood return noted 01/30/22 1445  Lumen #2 Status Flushed;Saline locked;Blood return noted 01/30/22 1445  Lumen #3 Status Flushed;Saline locked;Blood return noted 01/30/22 1445  Dressing Type Transparent;Securing device 01/30/22 1445  Dressing Status Antimicrobial disc in place;Clean, Dry, Intact 01/30/22 1445  Safety Lock Not Applicable 74/14/23 9532  Line Care Connections checked and tightened 01/30/22 1445  Line Adjustment (NICU/IV Team Only) No 01/30/22 1445  Dressing Intervention New dressing;Other (Comment) 01/30/22 1445  Dressing Change Due 02/06/22 01/30/22 1445    Patient's son, Anwitha Mapes, signed PICC consent. Verified by 2 IV/PICC RNs. Hematoma noted above PICC site and bruised also noted below or distal of PICC site.   Enos Fling 01/30/2022, 2:48 PM

## 2022-01-30 NOTE — Progress Notes (Signed)
[facility] ADULT ICU REPLACEMENT PROTOCOL   The patient does apply for the [facility] Adult ICU Electrolyte Replacment Protocol based on the criteria listed below:   1.Exclusion criteria: TCTS patients, ECMO patients, and Dialysis patients 2. Is GFR >/= 30 ml/min? Yes.    Patient's GFR today is 60 3. Is SCr </= 2? Yes.   Patient's SCr is 0.95 mg/dL 4. Did SCr increase >/= 0.5 in 24 hours? No. 5.Pt's weight >40kg  yes 6. Abnormal electrolyte(s): K+ 3.7 7. Electrolytes replaced per protocol 8.  Call MD STAT for K+ </= 2.5, Phos </= 1, or Mag </= 1 Physician:  n/a  Cassandra Dodson 01/30/2022 6:13 AM

## 2022-01-30 NOTE — Progress Notes (Signed)
[place] Progress Note Patient Name: MISSY BAKSH DOB: Apr 19, 1941 MRN: [id number]   Date of Service  01/30/2022  HPI/Events of Note  Patient no longer on Fentanyl IV infusion. Nursing request to change Fentanyl IV PRN from bolus from infusion to IV pushes.   eICU Interventions  Plan: Fentanyl 25-50 mcg IV Q 2 hours PRN pain, agitation or sedation.      Intervention Category Major Interventions: Delirium, psychosis, severe agitation - evaluation and management  Shaylah Mcghie Eugene 01/30/2022, 8:38 PM

## 2022-01-30 NOTE — Progress Notes (Signed)
NAME:  Cassandra Dodson, MRN:  [id number], DOB:  [birth year], LOS: 4 ADMISSION DATE:  01/26/2022, CONSULTATION DATE: 01/28/2022 REFERRING MD: EDP, CHIEF COMPLAINT: Altered mental status  History of Present Illness:  81 year old female with past medical history of meningioma and multiple CVAs most recently late 12/2021 admitted for altered mental status.  Patient found to have a pulmonary embolism.  Given altered mental status, patient required to be intubated.  Patient had low blood pressures requiring Levophed.  Patient was started on Lovenox for PE and admitted to ICU for further work-up.  Diabetes, meningioma, multiple CVAs  Pertinent  Medical History  Diabetes, meningioma, multiple CVAs  Significant Hospital Events: Including procedures, antibiotic start and stop dates in addition to other pertinent events   01/26/2022 brought to the ED with altered mental status, tachypnea, hypoxemia concerning for sepsis with septic shock.  CT angiogram shows saddle embolism.  Not a candidate for lytics or IR intervention due to meningioma and recent stroke.   01/27/2022-patient heparin held overnight given continuous bleeding from central venous catheter  Interim History / Subjective:  Overnight patient became febrile and was not able to be weaned off pressors.  Overnight, patient also had EKG performed, as nurse was worried about A-fib, but patient was not in atrial fibrillation.  Patient is in sinus rhythm and remains in sinus rhythm.  Family meeting was had and son did want all measures taken.  Patient is still intubated on my exam.  Patient is still requiring pressors.  Patient not requiring sedation.  Patient can open her eyes to stimuli, but cannot follow commands. Son at bedside reports that he would like to see if something can be done about the meningioma while the patient is still here. I express to patient that we will wait until there is improvement from this critically ill state at this time before  moving forward with that.   Objective   Blood pressure 138/70, pulse (!) 104, temperature (!) 100.9 F (38.3 C), resp. rate 17, height '5\' 3"'$  (1.6 m), weight 84 kg, SpO2 100 %.    Vent Mode: PRVC FiO2 (%):  [30 %] 30 % Set Rate:  [28 bmp] 28 bmp Vt Set:  [410 mL] 410 mL PEEP:  [5 cmH20] 5 cmH20 Pressure Support:  [8 cmH20] 8 cmH20 Plateau Pressure:  [13 cmH20-14 cmH20] 14 cmH20   Intake/Output Summary (Last 24 hours) at 01/30/2022 1030 Last data filed at 01/30/2022 0839 Gross per 24 hour  Intake 2489.52 ml  Output 660 ml  Net 1829.52 ml   Filed Weights   01/28/22 0400 01/28/22 0800 01/29/22 0453  Weight: 81.3 kg 85.3 kg 84 kg    Examination: General: Patient is intubated.  Not sedated.  Patient does not alert. Eyes: Not able to track.  Pupils equal and reactive to light.  Pale conjunctive a noted. Head: Normocephalic, atraumatic  Cardio: Tachycardic, regular rhythm.  No murmur rubs or gallops noted Pulmonary: Coarse lung sounds throughout Abdomen: Soft, with normoactive bowel sounds Neuro: Intubated. Not able to follow commands.  He does open eyes to verbal stimuli.  Patient can withdraw to pain. Extremities: Edema noted to bilateral upper and lower extremities. Skin: Bruising noted throughout.  Femoral line with minor oozing, but not too concerning  Resolved Hospital Problem list     Assessment & Plan:  This is a 81 year old female with a past medical history of a meningioma, diabetes, and multiple CVAs who presented with altered mental status.  Patient found to have pulmonary  embolism with very low blood pressures requiring pressors.  Patient admitted to the ICU started on Lovenox and pressors, and admitted for further work-up.  #Acute metabolic encephalopathy, likely secondary to saddle massive pulmonary embolism and severe sepsis Patient remains intubated.  Patient is status post mechanical thrombectomy 2 days ago.  Patient not requiring any sedation at this time. Urine  culture has not grow anything this this moment for 4 days now and antibiotics were discontinued yesterday. Patient is still requiring levophed. Patient became febrile overnight. Will plan to see if picc line can be placed and central line can be removed. Will also obtain chest Xray and sputum culture to see if patient is developing pneumonia.  -Follow cultures -Patient is now off of any sedation, await for patient to become more alert -Pro cal pending  -Plan for picc line, consider removing femoral line today  -CXR pending  -wean pressors as tolerated with MAP goal >65  -Start midodrine 10 mg q8h -ECHO limited pending   #Acute hypoxic respiratory failure likely secondary to above Patient is still requiring intubation given the patient is not able to follow commands.  Patient is not safe to protect her airway.  Patient satting well at 100% on her ventilator settings of PRVC, rate of 28, PEEP 5%.  Wean as tolerated. -SBT today  -Vap bundle in place -Wean as tolerated to keep O2 saturation greater than 92% -Pulmonary hygiene  #Blood loss anemia Hemoglobin this a.m. 8.5 after 2 units of blood. CT abdomen and pelvis did show some scattered hemorrhaging.  Patient likely had blood loss from the administration of lovenox and from the heparin. Patient femoral line with minimal oozing. Patient is currently stable from a blood loss point of view. Plan to restart heparin at this time .  -Resume Heparin   #Type 2 diabetes mellitus Blood glucose ranging into the 200s. Patient did have increase in insulin to 25 units and patient still having increased sugars into the 200s. This could be in the setting of tube feeds. Plan to adjust the insulin regimen.  -Continue glucose monitoring -Increase to 30 units semglee at bedtime  -Start short acting insulin 3 units q4h  -Sliding Scale insulin q4H -Continue Crestor 20 mg  #Meningioma Stable at this time.  No intervention needed. Will start looking into plan  prior to patient coming into the hospital to see if intervention will be needed while the patient is here as the son requested for [place] to do something. I did inform the son that the patient is critically ill and there can be no intervention at this time until she recovers and son is understanding about this.   -Monitor  #Stage I sacral decubitus ulcer and heel ulcer #Present on admission Stable -Continue wound care  #Leukocytosis, resolving  WBC down to 11.6 this AM.  This could be related to line but is resolving. -Continue to monitor -Consider picc line today   #Elevated liver enzymes AST 160, ALT 75.  Unclear etiology at this time, but could be due to hypoperfusion of the liver. -Continue to monitor CMP  #Oliguria  Patient remains oliguric in the setting of normal renal function.  This could be due to the fact that patient is critically ill, and is not perfusing kidney well.  Patient could also be third spacing.  This could be in the setting of shock, as patient requiring pressors, continue to monitor -Monitor urine output -Try lasix challenge today with 40 IV lasix  -Trend CMP  Best Practice (  right click and "Reselect all SmartList Selections" daily)   Diet/type: Tube feeds  DVT prophylaxis: SCDs GI prophylaxis: PPI Lines: Central line left femoral  Foley:  Yes, and it is still needed Code Status:  full code Family (son) updated at bedside  Labs   CBC: Recent Labs  Lab 01/26/22 1147 01/26/22 1151 01/28/22 0208 01/28/22 1325 01/28/22 1859 01/29/22 0444 01/29/22 1732 01/30/22 0432  WBC 20.1*   < > 13.1*  --  14.6* 15.2* 13.4* 11.6*  NEUTROABS 17.8*  --   --   --   --   --   --   --   HGB 12.0   < > 10.0* 7.8* 8.5* 7.3* 9.0* 8.5*  HCT 38.0   < > 30.0* 23.0* 24.4* 21.1* 25.2* 24.3*  MCV 96.4   < > 91.5  --  88.7 89.4 84.3 85.3  PLT 246   < > 219  --  198 212 119* 122*   < > = values in this interval not displayed.    Basic Metabolic Panel: Recent Labs  Lab  01/26/22 1147 01/26/22 1151 01/26/22 1152 01/27/22 0219 01/28/22 0208 01/28/22 1325 01/29/22 0444 01/30/22 0432  NA 133* 133*   < > 131* 133* 134* 132* 134*  K 3.8 3.8   < > 4.5 4.3 4.2 4.2 3.7  CL 102 102  --  99 104  --  103 104  CO2 19*  --   --  20* 21*  --  21* 24  GLUCOSE 321* 323*  --  290* 193*  --  355* 243*  BUN 17 17  --  24* 24*  --  33* 35*  CREATININE 0.92 0.70  --  0.99 0.81  --  1.03* 0.95  CALCIUM 9.3  --   --  9.8 9.4  --  8.4* 8.1*  MG  --   --   --  1.7 1.9  --   --   --   PHOS  --   --   --  3.6 2.4*  --   --   --    < > = values in this interval not displayed.   GFR: Estimated Creatinine Clearance: 46.8 mL/min (by C-G formula based on SCr of 0.95 mg/dL). Recent Labs  Lab 01/26/22 1147 01/26/22 1452 01/27/22 0219 01/27/22 0951 01/28/22 0208 01/28/22 1859 01/29/22 0444 01/29/22 1732 01/30/22 0432  WBC 20.1*  --    < >  --    < > 14.6* 15.2* 13.4* 11.6*  LATICACIDVEN 2.4* 2.8*  --  2.4*  --   --   --   --   --    < > = values in this interval not displayed.    Liver Function Tests: Recent Labs  Lab 01/26/22 1147 01/30/22 0432  AST 23 75*  ALT 16 160*  ALKPHOS 93 63  BILITOT 1.1 0.6  PROT 5.9* 4.3*  ALBUMIN 2.7* 2.1*   No results for input(s): "LIPASE", "AMYLASE" in the last 168 hours. No results for input(s): "AMMONIA" in the last 168 hours.  ABG    Component Value Date/Time   PHART 7.277 (L) 01/28/2022 1325   PCO2ART 53.2 (H) 01/28/2022 1325   PO2ART 422 (H) 01/28/2022 1325   HCO3 25.1 01/28/2022 1325   TCO2 27 01/28/2022 1325   ACIDBASEDEF 2.0 01/28/2022 1325   O2SAT 100 01/28/2022 1325     Coagulation Profile: No results for input(s): "INR", "PROTIME" in the last 168 hours.  Cardiac Enzymes: No  results for input(s): "CKTOTAL", "CKMB", "CKMBINDEX", "TROPONINI" in the last 168 hours.  HbA1C: Hemoglobin A1C  Date/Time Value Ref Range Status  01/23/2015 12:00 AM 7.6  Final   Hgb A1c MFr Bld  Date/Time Value Ref Range  Status  01/16/2022 08:52 AM 6.0 (H) 4.8 - 5.6 % Final    Comment:    (NOTE) Pre diabetes:          5.7%-6.4%  Diabetes:              >6.4%  Glycemic control for   <7.0% adults with diabetes   10/26/2021 02:25 AM 7.0 (H) 4.8 - 5.6 % Final    Comment:    (NOTE) Pre diabetes:          5.7%-6.4%  Diabetes:              >6.4%  Glycemic control for   <7.0% adults with diabetes     CBG: Recent Labs  Lab 01/29/22 1941 01/29/22 2344 01/30/22 0358 01/30/22 0806 01/30/22 0827  GLUCAP 178* 215* 241* 224* 222*    Review of Systems:   Negative except for what is stated in the HPI.   Past Medical History:  She,  has a past medical history of Arthritis, CKD (chronic kidney disease), stage III ([place]), Depression, Diabetes mellitus without complication ([place]), Difficult intravenous access, Endometrial cancer ([place]), GERD (gastroesophageal reflux disease), Headache, History of radiation therapy (11/04/2019-12/01/2019), Hypertension, Hypothyroidism, Neuromuscular disorder ([place]), Osteoarthritis, PMB (postmenopausal bleeding), PONV (postoperative nausea and vomiting), Urinary frequency, and Wears glasses.   Surgical History:   Past Surgical History:  Procedure Laterality Date   BREAST SURGERY     cyst removed   CESAREAN SECTION     DILATION AND CURETTAGE OF UTERUS N/A 06/24/2019   Procedure: DILATATION AND CURETTAGE;  Surgeon: Lafonda Mosses, MD;  Location: [facility];  Service: Gynecology;  Laterality: N/A;   FRACTURE SURGERY     right knee   INTRAUTERINE DEVICE (IUD) INSERTION N/A 06/24/2019   Procedure: INTRAUTERINE DEVICE (IUD) INSERTION MIRENA;  Surgeon: Lafonda Mosses, MD;  Location: [facility];  Service: Gynecology;  Laterality: N/A;   IR ANGIO INTRA EXTRACRAN SEL COM CAROTID INNOMINATE UNI R MOD SED  11/05/2021   IR ANGIO VERTEBRAL SEL VERTEBRAL UNI R MOD SED  11/05/2021   IR ANGIOGRAM PULMONARY BILATERAL SELECTIVE  01/28/2022   IR ANGIOGRAM  SELECTIVE EACH ADDITIONAL VESSEL  01/28/2022   IR ANGIOGRAM SELECTIVE EACH ADDITIONAL VESSEL  01/28/2022   IR CT HEAD LTD  11/01/2021   IR INTRA CRAN STENT  11/01/2021   IR RADIOLOGIST EVAL & MGMT  12/19/2021   IR THROMBECT PRIM MECH INIT (INCLU) MOD SED  01/28/2022   IR THROMBECT PRIM MECH INIT (INCLU) MOD SED  01/28/2022   IR US GUIDE VASC ACCESS RIGHT  11/01/2021   IR US GUIDE VASC ACCESS RIGHT  01/28/2022   IRRIGATION AND DEBRIDEMENT SEBACEOUS CYST     JOINT REPLACEMENT Right    right   LYMPH NODE DISSECTION N/A 09/14/2019   Procedure: LYMPH NODE DISSECTION;  Surgeon: Lafonda Mosses, MD;  Location: [facility];  Service: Gynecology;  Laterality: N/A;   RADIOLOGY WITH ANESTHESIA N/A 11/01/2021   Procedure: ANGIOPLASTY/STENT;  Surgeon: Luanne Bras, MD;  Location: [place];  Service: Radiology;  Laterality: N/A;   RADIOLOGY WITH ANESTHESIA N/A 01/28/2022   Procedure: IR WITH ANESTHESIA;  Surgeon: Radiologist, Medication, MD;  Location: [place];  Service: Radiology;  Laterality: N/A;  ROBOTIC ASSISTED TOTAL HYSTERECTOMY WITH BILATERAL SALPINGO OOPHERECTOMY Bilateral 09/14/2019   Procedure: XI ROBOTIC ASSISTED TOTAL HYSTERECTOMY WITH BILATERAL SALPINGO OOPHORECTOMY;  Surgeon: Lafonda Mosses, MD;  Location: [facility];  Service: Gynecology;  Laterality: Bilateral;   SENTINEL NODE BIOPSY N/A 09/14/2019   Procedure: SENTINEL NODE BIOPSY;  Surgeon: Lafonda Mosses, MD;  Location: [facility];  Service: Gynecology;  Laterality: N/A;   teeth extration     TONSILLECTOMY     TOTAL HIP ARTHROPLASTY Right 02/23/2015   Procedure: RIGHT TOTAL HIP ARTHROPLASTY ANTERIOR APPROACH;  Surgeon: Leandrew Koyanagi, MD;  Location: [place];  Service: Orthopedics;  Laterality: Right;   TOTAL KNEE ARTHROPLASTY Left 06/27/2016   Procedure: LEFT TOTAL KNEE ARTHROPLASTY;  Surgeon: Leandrew Koyanagi, MD;  Location: [place];  Service: Orthopedics;  Laterality: Left;     Social History:   reports that she quit smoking about 19 years ago. Her  smoking use included cigarettes. She has a 67.50 pack-year smoking history. She has never used smokeless tobacco. She reports current alcohol use. She reports that she does not use drugs.   Family History:  Her family history includes Hypertension in her father; Pancreatic cancer in her mother; Stroke in her father. There is no history of Colon cancer, Breast cancer, Ovarian cancer, Endometrial cancer, or Prostate cancer.   Allergies Allergies  Allergen Reactions   Anesthetics, Amide Other (See Comments)    Pt is intolerant to general anesthesia. Pt will throw up and has thrown up during procedure.    Ether Nausea And Vomiting     Home Medications  Prior to Admission medications   Medication Sig Start Date End Date Taking? Authorizing Provider  amLODipine (NORVASC) 5 MG tablet Take 1 tablet (5 mg total) by mouth daily. 01/18/22 02/17/22 Yes Pahwani, Einar Grad, MD  aspirin EC 81 MG tablet Take 1 tablet (81 mg total) by mouth daily for 16 days. Swallow whole. 01/18/22 02/03/22 Yes Pahwani, Einar Grad, MD  calcium carbonate (OS-CAL - DOSED IN MG OF ELEMENTAL CALCIUM) 1250 (500 Ca) MG tablet Take 1 tablet (1,250 mg total) by mouth daily with breakfast. 11/28/21  Yes Love, Ivan Anchors, PA-C  clopidogrel (PLAVIX) 75 MG tablet Take 1 tablet (75 mg total) by mouth daily. 11/27/21  Yes Love, Ivan Anchors, PA-C  docusate sodium (COLACE) 100 MG capsule Take 1 capsule (100 mg total) by mouth 2 (two) times daily. 11/27/21  Yes Love, Ivan Anchors, PA-C  fenofibrate 160 MG tablet Take 1 tablet (160 mg total) by mouth daily after breakfast. 11/27/21  Yes Love, Ivan Anchors, PA-C  folic acid (FOLVITE) 1 MG tablet Take 1 tablet (1 mg total) by mouth daily. 11/27/21  Yes Love, Ivan Anchors, PA-C  insulin glargine (LANTUS) 100 UNIT/ML Solostar Pen Inject 15 Units into the skin daily. 01/17/22  Yes Pahwani, Einar Grad, MD  insulin lispro (HUMALOG) 100 UNIT/ML KwikPen Inject 1 Units into the skin 3 (three) times daily with meals. 11/27/21  Yes Love, Ivan Anchors, PA-C   methylphenidate (RITALIN) 5 MG tablet Take 1 tablet (5 mg total) by mouth 2 (two) times daily with breakfast and lunch. 11/27/21  Yes Love, Ivan Anchors, PA-C  Mouthwashes (MOUTH RINSE) LIQD solution 15 mLs by Mouth Rinse route as needed (for oral care). 11/05/21  Yes Sheikh, Omair Latif, DO  pantoprazole (PROTONIX) 40 MG tablet Take 1 tablet (40 mg total) by mouth daily. 11/27/21  Yes Love, Ivan Anchors, PA-C  rosuvastatin (CRESTOR) 20 MG tablet Take 1 tablet (20 mg total) by  mouth daily. 11/27/21  Yes Love, Ivan Anchors, PA-C  senna (SENOKOT) 8.6 MG TABS tablet Take 13.2 mg by mouth at bedtime as needed for constipation. 12/18/21  Yes [provider]  traMADol (ULTRAM) 50 MG tablet Take 50 mg by mouth daily as needed for pain. 01/09/22  Yes [provider]  Zinc Oxide (DESITIN) 13 % CREA Apply 1 application  topically 2 (two) times daily. To buttocks for protection. 11/27/21  Yes Love, Ivan Anchors, PA-C  Insulin Pen Needle 32G X 4 MM MISC Use daily in the morning, at noon, and at bedtime. 11/27/21   Bary Leriche, PA-C     Critical care time: [place], DO Internal Medicine Resident PGY-1 Pager: [phone]

## 2022-01-30 NOTE — Progress Notes (Signed)
Inpatient Diabetes Program Recommendations  AACE/ADA: New Consensus Statement on Inpatient Glycemic Control (2015)  Target Ranges:  Prepandial:   less than 140 mg/dL      Peak postprandial:   less than 180 mg/dL (1-2 hours)      Critically ill patients:  140 - 180 mg/dL   Lab Results  Component Value Date   GLUCAP 222 (H) 01/30/2022   HGBA1C 6.0 (H) 01/16/2022    Review of Glycemic Control  Latest Reference Range & Units 01/29/22 17:31 01/29/22 19:41 01/29/22 23:44 01/30/22 03:58 01/30/22 08:06 01/30/22 08:27  Glucose-Capillary 70 - 99 mg/dL 189 (H) 178 (H) 215 (H) 241 (H) 224 (H) 222 (H)   Diabetes history: DM  Outpatient Diabetes medications:  Lantus 15 units daily, Humalog 1 units tid with meals Current orders for Inpatient glycemic control:  Novolog 0-15 units q 4 hours Vital 65 ml/hr Semglee 20 units q HS  Inpatient Diabetes Program Recommendations:    May consider adding Novolog 3 units q 4 hours to cover CHO in tube feeds.   Thanks,  Adah Perl, RN, BC-ADM Inpatient Diabetes Coordinator Pager [phone]  (8a-5p)

## 2022-01-30 NOTE — Progress Notes (Signed)
ANTICOAGULATION CONSULT NOTE -Follow Up  Pharmacy Consult for heparin Indication: pulmonary embolus  Allergies  Allergen Reactions   Anesthetics, Amide Other (See Comments)    Pt is intolerant to general anesthesia. Pt will throw up and has thrown up during procedure.    Ether Nausea And Vomiting    Patient Measurements: Height: '5\' 3"'$  (160 cm) Weight: 84 kg (185 lb 3 oz) IBW/kg (Calculated) : 52.4 Heparin Dosing Weight: 77 kg  Vital Signs: Temp: 99.7 F (37.6 C) (10/11 1915) Temp Source: Bladder (10/11 1200) BP: 141/81 (10/11 1400) Pulse Rate: 80 (10/11 1915)  Labs: Recent Labs    01/28/22 0208 01/28/22 1325 01/29/22 0444 01/29/22 1438 01/29/22 1732 01/30/22 0432 01/30/22 1905  HGB 10.0*   < > 7.3*  --  9.0* 8.5*  --   HCT 30.0*   < > 21.1*  --  25.2* 24.3*  --   PLT 219   < > 212  --  119* 122*  --   HEPARINUNFRC  --    < > >1.10* 0.29*  --  <0.10* 0.35  CREATININE 0.81  --  1.03*  --   --  0.95  --    < > = values in this interval not displayed.     Estimated Creatinine Clearance: 46.8 mL/min (by C-G formula based on SCr of 0.95 mg/dL).   Medical History: Past Medical History:  Diagnosis Date   Arthritis    CKD (chronic kidney disease), stage III ([place])    patient denies   Depression    Diabetes mellitus without complication ([place])    Difficult intravenous access    Endometrial cancer ([place])    GERD (gastroesophageal reflux disease)    Headache    History of radiation therapy 11/04/2019-12/01/2019   Endometrial HDR; Dr. Gery Pray   Hypertension    Hypothyroidism    Neuromuscular disorder ([place])    neuropathy in feet   Osteoarthritis    PMB (postmenopausal bleeding)    PONV (postoperative nausea and vomiting)    severe nausea and vomiting after knee replacement 06-2014, did ok with 2018 knee replacement   Urinary frequency    Wears glasses      Assessment: 81 yo W with acute saddle PE with RHS 10/7 in the setting of meningioma and recent CVA.  Patient was started on enoxaparin but had bleeding at CVC site s/p thrombi pad x3. No anticoagulation prior to admission. Pharmacy consulted for heparin.  He is now s/p thrombectomy and heparin was restarted post procedure   10/10: -Patient was concern for bleeding and received CT abdomen scan that showed no no intrapelvic or retroperitoneal hematoma and concerns for soft tissue edema vs scattered hemorrhage. Heparin was discontinued due to concern for bleeding and held for evening.  10/11: Pharmacy re-consulted to dose heparin for pulmonary embolism. Hgb down trending and plts ~stable around the 120s. On examination, patient had no signs of bleeding. Last heparin level was <0.10 on 10/11 AM  Resume heparin per CCM. No bolus and goal 0.3-0.5 units/ml. Previous enoxaparin from 10/7-10/8 likely still in system yesterday as shown by detectable heparin level (0.29) off heparin which was held yesterday for bleeding concern. CT abdomen negative for retroperitoneal bleed. Heparin level undetectable this AM off heparin indicating previous enoxaparin cleared.   HL 0.35 (therapeutic)- targeting low goal.  No issues per RN.   Goal of Therapy:  Heparin level 0.3-0.5 units/ml Monitor platelets by anticoagulation protocol: Yes   Plan:  -Continue Heparin at 1000 units/hr -  Monitor CBC, daily HL, and for signs/symptoms of bleeding  Wilson Singer, PharmD Clinical Pharmacist 01/30/2022 7:50 PM

## 2022-01-30 NOTE — Progress Notes (Signed)
  Echocardiogram 2D Echocardiogram has been performed.  Cassandra Dodson 01/30/2022, 5:50 PM

## 2022-01-30 NOTE — Progress Notes (Signed)
Pharmacy ICU Insulin Management 82 yoF with PMH HTN, T2DM, hypothyroidism, GERD, CKD, chronic anxiety/depression, osteoarthritis, left sided deficit and speech difficulty presented with obstructive PE. Patient has been consulted to be apart of the pharmacy insulin protocol.   Patient's CBG values in last 24 hours: 200-240s  Patient's current insulin regimen: insulin aspart 0-15 units every 4 hours and semglee 30 units QHS. Has received total of 27 units of SSI  and 25 units of glargine in last 24 hours  Plan -Increase semglee to 30 units QHS -Add novolog 3u q4h in addition to sliding scale insulin of 0-15 units every 4 hours.  Sandford Craze, PharmD. [facility] Acute Care PGY-1  01/30/2022 10:43 AM

## 2022-01-30 NOTE — Progress Notes (Addendum)
ANTICOAGULATION CONSULT NOTE -Follow Up  Pharmacy Consult for heparin Indication: pulmonary embolus  Allergies  Allergen Reactions   Anesthetics, Amide Other (See Comments)    Pt is intolerant to general anesthesia. Pt will throw up and has thrown up during procedure.    Ether Nausea And Vomiting    Patient Measurements: Height: '5\' 3"'$  (160 cm) Weight: 84 kg (185 lb 3 oz) IBW/kg (Calculated) : 52.4 Heparin Dosing Weight: 77 kg  Vital Signs: Temp: 100.9 F (38.3 C) (10/11 0830) Temp Source: Bladder (10/11 0800) BP: 138/70 (10/11 0830) Pulse Rate: 104 (10/11 0815)  Labs: Recent Labs    01/28/22 0208 01/28/22 1325 01/29/22 0444 01/29/22 1438 01/29/22 1732 01/30/22 0432  HGB 10.0*   < > 7.3*  --  9.0* 8.5*  HCT 30.0*   < > 21.1*  --  25.2* 24.3*  PLT 219   < > 212  --  119* 122*  HEPARINUNFRC  --   --  >1.10* 0.29*  --  <0.10*  CREATININE 0.81  --  1.03*  --   --  0.95   < > = values in this interval not displayed.     Estimated Creatinine Clearance: 46.8 mL/min (by C-G formula based on SCr of 0.95 mg/dL).   Medical History: Past Medical History:  Diagnosis Date   Arthritis    CKD (chronic kidney disease), stage III ([place])    patient denies   Depression    Diabetes mellitus without complication ([place])    Difficult intravenous access    Endometrial cancer ([place])    GERD (gastroesophageal reflux disease)    Headache    History of radiation therapy 11/04/2019-12/01/2019   Endometrial HDR; Dr. Gery Pray   Hypertension    Hypothyroidism    Neuromuscular disorder ([place])    neuropathy in feet   Osteoarthritis    PMB (postmenopausal bleeding)    PONV (postoperative nausea and vomiting)    severe nausea and vomiting after knee replacement 06-2014, did ok with 2018 knee replacement   Urinary frequency    Wears glasses      Assessment: 81 yo W with acute saddle PE with RHS 10/7 in the setting of meningioma and recent CVA. Patient was started on enoxaparin but  had bleeding at CVC site s/p thrombi pad x3. No anticoagulation prior to admission. Pharmacy consulted for heparin.  He is now s/p thrombectomy and heparin was restarted post procedure   10/10: -Patient was concern for bleeding and received CT abdomen scan that showed no no intrapelvic or retroperitoneal hematoma and concerns for soft tissue edema vs scattered hemorrhage. Heparin was discontinued due to concern for bleeding and held for evening.  10/11: Pharmacy re-consulted to dose heparin for pulmonary embolism. Hgb down trending and plts ~stable around the 120s. On examination, patient had no signs of bleeding. Last heparin level was <0.10 on 10/11 AM  Goal of Therapy:  Heparin level 0.3-0.5 units/ml Monitor platelets by anticoagulation protocol: Yes   Plan:  -Start Heparin at 1000 units/hr -Monitor heparin level in 8 hours - Monitor CBC and for sugns/symptoms of bleed  Sandford Craze, PharmD. [facility] Acute Care PGY-1  01/30/2022 9:57 AM

## 2022-01-31 DIAGNOSIS — I2602 Saddle embolus of pulmonary artery with acute cor pulmonale: Secondary | ICD-10-CM | POA: Diagnosis not present

## 2022-01-31 LAB — COMPREHENSIVE METABOLIC PANEL
ALT: 113 U/L — ABNORMAL HIGH (ref 0–44)
AST: 37 U/L (ref 15–41)
Albumin: 2.2 g/dL — ABNORMAL LOW (ref 3.5–5.0)
Alkaline Phosphatase: 62 U/L (ref 38–126)
Anion gap: 9 (ref 5–15)
BUN: 31 mg/dL — ABNORMAL HIGH (ref 8–23)
CO2: 26 mmol/L (ref 22–32)
Calcium: 8.3 mg/dL — ABNORMAL LOW (ref 8.9–10.3)
Chloride: 101 mmol/L (ref 98–111)
Creatinine, Ser: 0.62 mg/dL (ref 0.44–1.00)
GFR, Estimated: >60 mL/min (ref >60)
Glucose, Bld: 185 mg/dL — ABNORMAL HIGH (ref 70–99)
Potassium: 4.7 mmol/L (ref 3.5–5.1)
Sodium: 136 mmol/L (ref 135–145)
Total Bilirubin: 0.6 mg/dL (ref 0.3–1.2)
Total Protein: 4.5 g/dL — ABNORMAL LOW (ref 6.5–8.1)

## 2022-01-31 LAB — CBC
HCT: 25.5 % — ABNORMAL LOW (ref 36.0–46.0)
Hemoglobin: 8.5 g/dL — ABNORMAL LOW (ref 12.0–15.0)
MCH: 29.5 pg (ref 26.0–34.0)
MCHC: 33.3 g/dL (ref 30.0–36.0)
MCV: 88.5 fL (ref 80.0–100.0)
Platelets: 108 10*3/uL — ABNORMAL LOW (ref 150–400)
RBC: 2.88 MIL/uL — ABNORMAL LOW (ref 3.87–5.11)
RDW: 15.2 % (ref 11.5–15.5)
WBC: 11 10*3/uL — ABNORMAL HIGH (ref 4.0–10.5)
nRBC: 0.7 % — ABNORMAL HIGH (ref 0.0–0.2)

## 2022-01-31 LAB — HEPARIN LEVEL (UNFRACTIONATED): Heparin Unfractionated: 0.43 IU/mL (ref 0.30–0.70)

## 2022-01-31 LAB — CULTURE, BLOOD (ROUTINE X 2)
Culture: NO GROWTH
Special Requests: ADEQUATE

## 2022-01-31 LAB — GLUCOSE, CAPILLARY
Glucose-Capillary: 119 mg/dL — ABNORMAL HIGH (ref 70–99)
Glucose-Capillary: 123 mg/dL — ABNORMAL HIGH (ref 70–99)
Glucose-Capillary: 146 mg/dL — ABNORMAL HIGH (ref 70–99)
Glucose-Capillary: 168 mg/dL — ABNORMAL HIGH (ref 70–99)
Glucose-Capillary: 190 mg/dL — ABNORMAL HIGH (ref 70–99)
Glucose-Capillary: 191 mg/dL — ABNORMAL HIGH (ref 70–99)
Glucose-Capillary: 197 mg/dL — ABNORMAL HIGH (ref 70–99)
Glucose-Capillary: 235 mg/dL — ABNORMAL HIGH (ref 70–99)

## 2022-01-31 MED ORDER — INSULIN GLARGINE-YFGN 100 UNIT/ML ~~LOC~~ SOLN
15.0000 [IU] | Freq: Every day | SUBCUTANEOUS | Status: DC
  Administered 2022-02-01: 15 [IU] via SUBCUTANEOUS
  Filled 2022-01-31 (×3): qty 0.15

## 2022-01-31 MED ORDER — SODIUM CHLORIDE 0.9 % IV SOLN
1.0000 g | Freq: Three times a day (TID) | INTRAVENOUS | Status: DC
  Administered 2022-01-31 – 2022-02-07 (×21): 1 g via INTRAVENOUS
  Filled 2022-01-31 (×21): qty 20

## 2022-01-31 MED ORDER — INSULIN ASPART 100 UNIT/ML IJ SOLN
0.0000 [IU] | Freq: Three times a day (TID) | INTRAMUSCULAR | Status: DC
  Administered 2022-02-01: 2 [IU] via SUBCUTANEOUS

## 2022-01-31 MED ORDER — ALBUTEROL SULFATE (2.5 MG/3ML) 0.083% IN NEBU: 2.5000 mg | INHALATION_SOLUTION | RESPIRATORY_TRACT | Status: DC | PRN

## 2022-01-31 MED ORDER — INSULIN ASPART 100 UNIT/ML IJ SOLN
0.0000 [IU] | Freq: Every day | INTRAMUSCULAR | Status: DC
  Administered 2022-02-10: 3 [IU] via SUBCUTANEOUS

## 2022-01-31 MED ORDER — SODIUM CHLORIDE 3 % IN NEBU
4.0000 mL | INHALATION_SOLUTION | Freq: Every day | RESPIRATORY_TRACT | Status: AC
  Administered 2022-01-31 – 2022-02-02 (×3): 4 mL via RESPIRATORY_TRACT
  Filled 2022-01-31 (×3): qty 4

## 2022-01-31 MED ORDER — FUROSEMIDE 10 MG/ML IJ SOLN
40.0000 mg | Freq: Two times a day (BID) | INTRAMUSCULAR | Status: DC
  Administered 2022-01-31 – 2022-02-01 (×2): 40 mg via INTRAVENOUS
  Filled 2022-01-31 (×2): qty 4

## 2022-01-31 MED ORDER — ORAL CARE MOUTH RINSE
15.0000 mL | OROMUCOSAL | Status: DC
  Administered 2022-01-31 – 2022-02-16 (×52): 15 mL via OROMUCOSAL

## 2022-01-31 MED ORDER — SODIUM CHLORIDE 0.9 % IV SOLN: INTRAVENOUS | Status: DC | PRN

## 2022-01-31 MED ORDER — SODIUM CHLORIDE 0.9 % IV SOLN
2.0000 g | Freq: Two times a day (BID) | INTRAVENOUS | Status: DC
  Administered 2022-01-31: 2 g via INTRAVENOUS
  Filled 2022-01-31: qty 12.5

## 2022-01-31 MED ORDER — ORAL CARE MOUTH RINSE: 15.0000 mL | OROMUCOSAL | Status: DC | PRN

## 2022-01-31 NOTE — Progress Notes (Addendum)
ANTICOAGULATION CONSULT NOTE -Follow Up  Pharmacy Consult for heparin Indication: pulmonary embolus  Allergies  Allergen Reactions   Anesthetics, Amide Other (See Comments)    Pt is intolerant to general anesthesia. Pt will throw up and has thrown up during procedure.    Ether Nausea And Vomiting    Patient Measurements: Height: '5\' 3"'$  (160 cm) Weight: 84 kg (185 lb 3 oz) IBW/kg (Calculated) : 52.4 Heparin Dosing Weight: 77 kg  Vital Signs: Temp: 99.5 F (37.5 C) (10/12 0500) Temp Source: Bladder (10/11 2000) BP: 112/75 (10/12 0500) Pulse Rate: 79 (10/12 0500)  Labs: Recent Labs    01/29/22 0444 01/29/22 1438 01/29/22 1732 01/30/22 0432 01/30/22 1905 01/31/22 0326  HGB 7.3*  --  9.0* 8.5*  --  8.5*  HCT 21.1*  --  25.2* 24.3*  --  25.5*  PLT 212  --  119* 122*  --  108*  HEPARINUNFRC >1.10*   < >  --  <0.10* 0.35 0.43  CREATININE 1.03*  --   --  0.95  --  0.62   < > = values in this interval not displayed.     Estimated Creatinine Clearance: 55.6 mL/min (by C-G formula based on SCr of 0.62 mg/dL).   Medical History: Past Medical History:  Diagnosis Date   Arthritis    CKD (chronic kidney disease), stage III ([place])    patient denies   Depression    Diabetes mellitus without complication ([place])    Difficult intravenous access    Endometrial cancer ([place])    GERD (gastroesophageal reflux disease)    Headache    History of radiation therapy 11/04/2019-12/01/2019   Endometrial HDR; Dr. Gery Pray   Hypertension    Hypothyroidism    Neuromuscular disorder ([place])    neuropathy in feet   Osteoarthritis    PMB (postmenopausal bleeding)    PONV (postoperative nausea and vomiting)    severe nausea and vomiting after knee replacement 06-2014, did ok with 2018 knee replacement   Urinary frequency    Wears glasses      Assessment: 81 yo W with acute saddle PE with RHS 10/7 in the setting of meningioma and recent CVA. Patient was started on enoxaparin but had  bleeding at CVC site s/p thrombi pad x3. No anticoagulation prior to admission. Pharmacy consulted for heparin.  He is now s/p thrombectomy and heparin was restarted post procedure  10/10: -Patient was concern for bleeding and received CT abdomen scan that showed no no intrapelvic or retroperitoneal hematoma and concerns for soft tissue edema vs scattered hemorrhage. Heparin was discontinued due to concern for bleeding and held for evening.  10/11: Pharmacy re-consulted to dose heparin for pulmonary embolism. Hgb down trending and plts ~stable around the 120s. On examination, patient had no signs of bleeding. Last heparin level was <0.10 on 10/11 AM  Resume heparin per CCM. No bolus and goal 0.3-0.5 units/ml. Previous enoxaparin from 10/7-10/8 likely still in system yesterday as shown by detectable heparin level (0.29) off heparin which was held yesterday for bleeding concern. CT abdomen negative for retroperitoneal bleed.   Heparin level 0.43 (therapeutic). Hgb 8.5 (low, stable) and plts 108 (down trending). No signs/symptoms of bleeding or issues with infusion for nursing.   Goal of Therapy:  Heparin level 0.3-0.5 units/ml Monitor platelets by anticoagulation protocol: Yes   Plan:  -Continue Heparin at 1000 units/hr -Monitor CBC, daily HL, and for signs/symptoms of bleeding -f/u speech eval and oral AC plan since extubated  Sandford Craze, PharmD. [facility] Acute Care PGY-1  01/31/2022 12:19 PM

## 2022-01-31 NOTE — Progress Notes (Signed)
SLP Cancellation Note  Patient Details Name: Cassandra Dodson MRN: [id number] DOB: 1940/06/05   Cancelled treatment:       Reason Eval/Treat Not Completed: Other (comment) (RN reports patient not following many comnmands and not appropriate at this time for swallow evaluation. SLP will f/u next date if patient is appropriate.)   Sonia Baller, MA, CCC-SLP Speech Therapy

## 2022-01-31 NOTE — Procedures (Signed)
Extubation Procedure Note  Patient Details:   Name: Cassandra Dodson DOB: 1941/04/20 MRN: [id number]   Airway Documentation:    Vent end date: (not recorded) Vent end time: (not recorded)   Evaluation  O2 sats: stable throughout Complications: No apparent complications Patient did tolerate procedure well. Bilateral Breath Sounds: Clear   No Cuff leak noted. RT extubated pt to 4L [place]. No respiratory distress noted.   Patsy [place] Zoila Ditullio 01/31/2022, 11:00 AM

## 2022-01-31 NOTE — Progress Notes (Signed)
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  Patient found to have a pulmonary embolism.  Given altered mental status, patient required to be intubated.  Patient had low blood pressures requiring Levophed.  Patient was started on Lovenox for PE and admitted to ICU for further work-up.  Diabetes, meningioma, multiple CVAs  Pertinent  Medical History  Diabetes, meningioma, multiple CVAs  Significant Hospital Events: Including procedures, antibiotic start and stop dates in addition to other pertinent events   01/26/2022 brought to the ED with altered mental status, tachypnea, hypoxemia concerning for sepsis with septic shock.  CT angiogram shows saddle embolism.  Not a candidate for lytics or IR intervention due to meningioma and recent stroke.   01/27/2022-patient heparin held overnight given continuous bleeding from central venous catheter 10/11: Heparin resumed   Interim History / Subjective:  Overnight patient broke her fever with the most recent fever being at 245pm on 01/31/22.  No other overnight events for patient. Patient remains intubated on my exam. Patient can awake to voice.  Objective   Blood pressure 97/76, pulse 99, temperature 99.3 F (37.4 C), resp. rate (!) 28, height '5\' 3"'$  (1.6 m), weight 84 kg, SpO2 100 %.    Vent Mode: PSV;CPAP FiO2 (%):  [30 %] 30 % Set Rate:  [28 bmp] 28 bmp Vt Set:  [410 mL] 410 mL PEEP:  [5 cmH20] 5 cmH20 Pressure Support:  [5 cmH20-10 cmH20] 5 cmH20 Plateau Pressure:  [13 cmH20-21 cmH20] 21 cmH20   Intake/Output Summary (Last 24 hours) at 01/31/2022 1022 Last data filed at 01/31/2022 0641 Gross per 24 hour  Intake 1735.21 ml  Output 3747 ml   Net -2011.79 ml   Filed Weights   01/28/22 0800 01/29/22 0453 01/31/22 0500  Weight: 85.3 kg 84 kg 84 kg    Examination: General: Patient is intubated.  Not sedated.  Patient is alert and can respond to voice.  Eyes: Not able to track.  Pupils equal and reactive to light.  Pale conjunctive a noted. Head: Normocephalic, atraumatic  Cardio: Tachycardic, regular rhythm.  No murmur rubs or gallops noted Pulmonary: Coarse lung sounds throughout Abdomen: Soft, with normoactive bowel sounds Neuro: Intubated. Can open eyes to verbal stimuli and does attempt to follow commands.  Extremities: Edema noted to bilateral upper and lower extremities. Left upper extremity with notable pitting edema  Skin: Bruising noted throughout.  Femoral line is out with site with no oozing   Resolved Hospital Problem list     Assessment & Plan:  This is a 81 year old female with a past medical history of a meningioma, diabetes, and multiple CVAs who presented with altered mental status.  Patient found to have pulmonary embolism with very low blood pressures requiring pressors.  Patient admitted to the ICU started on Lovenox and pressors, and admitted for further work-up.  #Acute metabolic encephalopathy, likely secondary to saddle massive pulmonary embolism and severe sepsis Patient remains intubated.  Patient is status post mechanical thrombectomy 3 days ago.  Patient not requiring any sedation at this time. Patient is starting to be come more alert now. Patient is still requiring levophed, but is weaning down. Heparin was restarted yesterday and hemoglobin is maintaining well. Procal 0.31. Chest Xray was  clean and ECHO was for the most part normal. Patient is slowly improving day by day.  -Patient is now off of any sedation, await for patient to become more alert -wean pressors as tolerated with MAP goal >65  -Continue midodrine 10 mg q8h -Likely to SBT and extubate today   #Acute hypoxic respiratory failure  likely secondary to above Patient SBT today, will likely plan to extubate today if all goes well -SBT and extubate today -Chest PT -hypertonic nebs -oxygenation goal >92% -Monitor for signs of hypoxia   #Blood loss anemia Hemoglobin stable at 8.5 this AM. Femoral line out. Patient was resumed on heparin yesterday and with stable hemoglobin, this is reassuring. Continue to monitor. -monitor for blood loss and monitor CBC  #Type 2 diabetes mellitus Despite increasing to 30 of semglee, patient is still having elevated sugars into the 190s. Pharmacy to continue to titrate insulin regimen to make sure goal is less than 190.  -Continue glucose monitoring -Pharmacy to titrate insulin  -Current regimen include semglee 30 units nightly, novolog 3 units q4h, and Sliding scale  -Continue Crestor 20 mg  #Meningioma Stable at this time.  No intervention needed at this time. Will chart review, patient did not need steroids in the previous visit. Plan was for outpatient follow up. I do not see that there is an appointment for neurosurgery for the patient for outpatient follow up. If patient continues to improve and gets out of this critically ill state, will consider neurosurgery consult in house.  -Monitor  #Stage I sacral decubitus ulcer and heel ulcer #Present on admission Stable -Continue wound care  #Leukocytosis, resolving  WBC down to 11.0 this AM.  This could be related to line but is resolving. Line is out now  -Continue to monitor -Consider picc line today   #Elevated liver enzymes, resolving  AST 37, ALT 113. Improving  -Continue to monitor CMP  #Oliguria, resolving  Lasix challenge yesterday was successful. Patient had total output of 4L yesterday. Kidney function is still good. Patient is responding well to lasix. Will plan to do more lasix today. Will remove foley today and place purewick.  -Continue with lasix  -Monitor Urine output -Trend CMP  Best Practice (right click  and "Reselect all SmartList Selections" daily)   Diet/type: Npo until patient can pass beside swallow  DVT prophylaxis: Heparin  GI prophylaxis: PPI Lines: Picc Foley:  Purwick Code Status:  full code Family (son) updated at bedside  Labs   CBC: Recent Labs  Lab 01/26/22 1147 01/26/22 1151 01/28/22 1859 01/29/22 0444 01/29/22 1732 01/30/22 0432 01/31/22 0326  WBC 20.1*   < > 14.6* 15.2* 13.4* 11.6* 11.0*  NEUTROABS 17.8*  --   --   --   --   --   --   HGB 12.0   < > 8.5* 7.3* 9.0* 8.5* 8.5*  HCT 38.0   < > 24.4* 21.1* 25.2* 24.3* 25.5*  MCV 96.4   < > 88.7 89.4 84.3 85.3 88.5  PLT 246   < > 198 212 119* 122* 108*   < > = values in this interval not displayed.    Basic Metabolic Panel: Recent Labs  Lab 01/27/22 0219 01/28/22 0208 01/28/22 1325 01/29/22 0444 01/30/22 0432 01/31/22 0326  NA 131* 133* 134* 132* 134* 136  K 4.5 4.3 4.2 4.2 3.7 4.7  CL 99 104  --  103 104 101  CO2 20* 21*  --  21* 24 26  GLUCOSE 290* 193*  --  355* 243* 185*  BUN 24* 24*  --  33* 35* 31*  CREATININE 0.99 0.81  --  1.03* 0.95 0.62  CALCIUM 9.8 9.4  --  8.4* 8.1* 8.3*  MG 1.7 1.9  --   --   --   --   PHOS 3.6 2.4*  --   --   --   --    GFR: Estimated Creatinine Clearance: 55.6 mL/min (by C-G formula based on SCr of 0.62 mg/dL). Recent Labs  Lab 01/26/22 1147 01/26/22 1452 01/27/22 0219 01/27/22 0951 01/28/22 0208 01/29/22 0444 01/29/22 1732 01/30/22 0432 01/30/22 1115 01/31/22 0326  PROCALCITON  --   --   --   --   --   --   --   --  0.31  --   WBC 20.1*  --    < >  --    < > 15.2* 13.4* 11.6*  --  11.0*  LATICACIDVEN 2.4* 2.8*  --  2.4*  --   --   --   --   --   --    < > = values in this interval not displayed.    Liver Function Tests: Recent Labs  Lab 01/26/22 1147 01/30/22 0432 01/31/22 0326  AST 23 75* 37  ALT 16 160* 113*  ALKPHOS 93 63 62  BILITOT 1.1 0.6 0.6  PROT 5.9* 4.3* 4.5*  ALBUMIN 2.7* 2.1* 2.2*   No results for input(s): "LIPASE", "AMYLASE" in  the last 168 hours. No results for input(s): "AMMONIA" in the last 168 hours.  ABG    Component Value Date/Time   PHART 7.277 (L) 01/28/2022 1325   PCO2ART 53.2 (H) 01/28/2022 1325   PO2ART 422 (H) 01/28/2022 1325   HCO3 25.1 01/28/2022 1325   TCO2 27 01/28/2022 1325   ACIDBASEDEF 2.0 01/28/2022 1325   O2SAT 100 01/28/2022 1325     Coagulation Profile: No results for input(s): "INR", "PROTIME" in the last 168 hours.  Cardiac Enzymes: No results for input(s): "CKTOTAL", "CKMB", "CKMBINDEX", "TROPONINI" in the last 168 hours.  HbA1C: Hemoglobin A1C  Date/Time Value Ref Range Status  01/23/2015 12:00 AM 7.6  Final   Hgb A1c MFr Bld  Date/Time Value Ref Range Status  01/16/2022 08:52 AM 6.0 (H) 4.8 - 5.6 % Final    Comment:    (NOTE) Pre diabetes:          5.7%-6.4%  Diabetes:              >6.4%  Glycemic control for   <7.0% adults with diabetes   10/26/2021 02:25 AM 7.0 (H) 4.8 - 5.6 % Final    Comment:    (NOTE) Pre diabetes:          5.7%-6.4%  Diabetes:              >6.4%  Glycemic control for   <7.0% adults with diabetes     CBG: Recent Labs  Lab 01/30/22 1613 01/30/22 1943 01/30/22 2330 01/31/22 0334 01/31/22 0804  GLUCAP 235* 197* 190* 168* 191*    Review of Systems:   Negative except for what is stated in the HPI.   Past Medical History:  She,  has a past medical history of Arthritis, CKD (chronic kidney disease), stage III ([place]), Depression, Diabetes mellitus without complication ([place]), Difficult intravenous access, Endometrial cancer ([place]), GERD (gastroesophageal reflux disease), Headache, History of radiation therapy (11/04/2019-12/01/2019), Hypertension, Hypothyroidism, Neuromuscular disorder ([place]), Osteoarthritis, PMB (postmenopausal bleeding), PONV (postoperative nausea and vomiting), Urinary frequency,  and Wears glasses.   Surgical History:   Past Surgical History:  Procedure Laterality Date   BREAST SURGERY     cyst removed    CESAREAN SECTION     DILATION AND CURETTAGE OF UTERUS N/A 06/24/2019   Procedure: DILATATION AND CURETTAGE;  Surgeon: Lafonda Mosses, MD;  Location[facility];  Service: Gynecology;  Laterality: N/A;   FRACTURE SURGERY     right knee   INTRAUTERINE DEVICE (IUD) INSERTION N/A 06/24/2019   Procedure: INTRAUTERINE DEVICE (IUD) INSERTION MIRENA;  Surgeon: Lafonda Mosses, MD;  Location[facility];  Service: Gynecology;  Laterality: N/A;   IR ANGIO INTRA EXTRACRAN SEL COM CAROTID INNOMINATE UNI R MOD SED  11/05/2021   IR ANGIO VERTEBRAL SEL VERTEBRAL UNI R MOD SED  11/05/2021   IR ANGIOGRAM PULMONARY BILATERAL SELECTIVE  01/28/2022   IR ANGIOGRAM SELECTIVE EACH ADDITIONAL VESSEL  01/28/2022   IR ANGIOGRAM SELECTIVE EACH ADDITIONAL VESSEL  01/28/2022   IR CT HEAD LTD  11/01/2021   IR INTRA CRAN STENT  11/01/2021   IR RADIOLOGIST EVAL & MGMT  12/19/2021   IR THROMBECT PRIM MECH INIT (INCLU) MOD SED  01/28/2022   IR THROMBECT PRIM MECH INIT (INCLU) MOD SED  01/28/2022   IR US GUIDE VASC ACCESS RIGHT  11/01/2021   IR US GUIDE VASC ACCESS RIGHT  01/28/2022   IRRIGATION AND DEBRIDEMENT SEBACEOUS CYST     JOINT REPLACEMENT Right    right   LYMPH NODE DISSECTION N/A 09/14/2019   Procedure: LYMPH NODE DISSECTION;  Surgeon: Lafonda Mosses, MD;  Location: [facility];  Service: Gynecology;  Laterality: N/A;   RADIOLOGY WITH ANESTHESIA N/A 11/01/2021   Procedure: ANGIOPLASTY/STENT;  Surgeon: Luanne Bras, MD;  Location: [place];  Service: Radiology;  Laterality: N/A;   RADIOLOGY WITH ANESTHESIA N/A 01/28/2022   Procedure: IR WITH ANESTHESIA;  Surgeon: Radiologist, Medication, MD;  Location: [place];  Service: Radiology;  Laterality: N/A;   ROBOTIC ASSISTED TOTAL HYSTERECTOMY WITH BILATERAL SALPINGO OOPHERECTOMY Bilateral 09/14/2019   Procedure: XI ROBOTIC ASSISTED TOTAL HYSTERECTOMY WITH BILATERAL SALPINGO OOPHORECTOMY;  Surgeon: Lafonda Mosses, MD;  Location: [facility];   Service: Gynecology;  Laterality: Bilateral;   SENTINEL NODE BIOPSY N/A 09/14/2019   Procedure: SENTINEL NODE BIOPSY;  Surgeon: Lafonda Mosses, MD;  Location: [facility];  Service: Gynecology;  Laterality: N/A;   teeth extration     TONSILLECTOMY     TOTAL HIP ARTHROPLASTY Right 02/23/2015   Procedure: RIGHT TOTAL HIP ARTHROPLASTY ANTERIOR APPROACH;  Surgeon: Leandrew Koyanagi, MD;  Location: [place];  Service: Orthopedics;  Laterality: Right;   TOTAL KNEE ARTHROPLASTY Left 06/27/2016   Procedure: LEFT TOTAL KNEE ARTHROPLASTY;  Surgeon: Leandrew Koyanagi, MD;  Location: [place];  Service: Orthopedics;  Laterality: Left;     Social History:   reports that she quit smoking about 19 years ago. Her smoking use included cigarettes. She has a 67.50 pack-year smoking history. She has never used smokeless tobacco. She reports current alcohol use. She reports that she does not use drugs.   Family History:  Her family history includes Hypertension in her father; Pancreatic cancer in her mother; Stroke in her father. There is no history of Colon cancer, Breast cancer, Ovarian cancer, Endometrial cancer, or Prostate cancer.   Allergies Allergies  Allergen Reactions   Anesthetics, Amide Other (See Comments)    Pt is intolerant to general anesthesia. Pt will throw up and has thrown up during procedure.  Ether Nausea And Vomiting     Home Medications  Prior to Admission medications   Medication Sig Start Date End Date Taking? Authorizing Provider  amLODipine (NORVASC) 5 MG tablet Take 1 tablet (5 mg total) by mouth daily. 01/18/22 02/17/22 Yes Pahwani, Einar Grad, MD  aspirin EC 81 MG tablet Take 1 tablet (81 mg total) by mouth daily for 16 days. Swallow whole. 01/18/22 02/03/22 Yes Pahwani, Einar Grad, MD  calcium carbonate (OS-CAL - DOSED IN MG OF ELEMENTAL CALCIUM) 1250 (500 Ca) MG tablet Take 1 tablet (1,250 mg total) by mouth daily with breakfast. 11/28/21  Yes Love, Ivan Anchors, PA-C  clopidogrel (PLAVIX) 75 MG tablet Take 1  tablet (75 mg total) by mouth daily. 11/27/21  Yes Love, Ivan Anchors, PA-C  docusate sodium (COLACE) 100 MG capsule Take 1 capsule (100 mg total) by mouth 2 (two) times daily. 11/27/21  Yes Love, Ivan Anchors, PA-C  fenofibrate 160 MG tablet Take 1 tablet (160 mg total) by mouth daily after breakfast. 11/27/21  Yes Love, Ivan Anchors, PA-C  folic acid (FOLVITE) 1 MG tablet Take 1 tablet (1 mg total) by mouth daily. 11/27/21  Yes Love, Ivan Anchors, PA-C  insulin glargine (LANTUS) 100 UNIT/ML Solostar Pen Inject 15 Units into the skin daily. 01/17/22  Yes Pahwani, Einar Grad, MD  insulin lispro (HUMALOG) 100 UNIT/ML KwikPen Inject 1 Units into the skin 3 (three) times daily with meals. 11/27/21  Yes Love, Ivan Anchors, PA-C  methylphenidate (RITALIN) 5 MG tablet Take 1 tablet (5 mg total) by mouth 2 (two) times daily with breakfast and lunch. 11/27/21  Yes Love, Ivan Anchors, PA-C  Mouthwashes (MOUTH RINSE) LIQD solution 15 mLs by Mouth Rinse route as needed (for oral care). 11/05/21  Yes Sheikh, Omair Latif, DO  pantoprazole (PROTONIX) 40 MG tablet Take 1 tablet (40 mg total) by mouth daily. 11/27/21  Yes Love, Ivan Anchors, PA-C  rosuvastatin (CRESTOR) 20 MG tablet Take 1 tablet (20 mg total) by mouth daily. 11/27/21  Yes Love, Ivan Anchors, PA-C  senna (SENOKOT) 8.6 MG TABS tablet Take 13.2 mg by mouth at bedtime as needed for constipation. 12/18/21  Yes [provider]  traMADol (ULTRAM) 50 MG tablet Take 50 mg by mouth daily as needed for pain. 01/09/22  Yes [provider]  Zinc Oxide (DESITIN) 13 % CREA Apply 1 application  topically 2 (two) times daily. To buttocks for protection. 11/27/21  Yes Love, Ivan Anchors, PA-C  Insulin Pen Needle 32G X 4 MM MISC Use daily in the morning, at noon, and at bedtime. 11/27/21   Bary Leriche, PA-C     Critical care time: [place], DO Internal Medicine Resident PGY-1 Pager: [phone]

## 2022-02-01 ENCOUNTER — Inpatient Hospital Stay (HOSPITAL_COMMUNITY): Payer: Medicare Other

## 2022-02-01 DIAGNOSIS — I2602 Saddle embolus of pulmonary artery with acute cor pulmonale: Secondary | ICD-10-CM | POA: Diagnosis not present

## 2022-02-01 LAB — COMPREHENSIVE METABOLIC PANEL
ALT: 82 U/L — ABNORMAL HIGH (ref 0–44)
AST: 30 U/L (ref 15–41)
Albumin: 2.3 g/dL — ABNORMAL LOW (ref 3.5–5.0)
Alkaline Phosphatase: 53 U/L (ref 38–126)
Anion gap: 9 (ref 5–15)
BUN: 24 mg/dL — ABNORMAL HIGH (ref 8–23)
CO2: 26 mmol/L (ref 22–32)
Calcium: 8.5 mg/dL — ABNORMAL LOW (ref 8.9–10.3)
Chloride: 99 mmol/L (ref 98–111)
Creatinine, Ser: 0.65 mg/dL (ref 0.44–1.00)
GFR, Estimated: >60 mL/min (ref >60)
Glucose, Bld: 118 mg/dL — ABNORMAL HIGH (ref 70–99)
Potassium: 3.8 mmol/L (ref 3.5–5.1)
Sodium: 134 mmol/L — ABNORMAL LOW (ref 135–145)
Total Bilirubin: 0.9 mg/dL (ref 0.3–1.2)
Total Protein: 4.9 g/dL — ABNORMAL LOW (ref 6.5–8.1)

## 2022-02-01 LAB — GLUCOSE, CAPILLARY
Glucose-Capillary: 101 mg/dL — ABNORMAL HIGH (ref 70–99)
Glucose-Capillary: 104 mg/dL — ABNORMAL HIGH (ref 70–99)
Glucose-Capillary: 106 mg/dL — ABNORMAL HIGH (ref 70–99)
Glucose-Capillary: 107 mg/dL — ABNORMAL HIGH (ref 70–99)
Glucose-Capillary: 133 mg/dL — ABNORMAL HIGH (ref 70–99)
Glucose-Capillary: 181 mg/dL — ABNORMAL HIGH (ref 70–99)
Glucose-Capillary: 191 mg/dL — ABNORMAL HIGH (ref 70–99)

## 2022-02-01 LAB — CULTURE, RESPIRATORY W GRAM STAIN

## 2022-02-01 LAB — CBC
HCT: 26.6 % — ABNORMAL LOW (ref 36.0–46.0)
Hemoglobin: 8.8 g/dL — ABNORMAL LOW (ref 12.0–15.0)
MCH: 29.9 pg (ref 26.0–34.0)
MCHC: 33.1 g/dL (ref 30.0–36.0)
MCV: 90.5 fL (ref 80.0–100.0)
Platelets: 106 10*3/uL — ABNORMAL LOW (ref 150–400)
RBC: 2.94 MIL/uL — ABNORMAL LOW (ref 3.87–5.11)
RDW: 15.3 % (ref 11.5–15.5)
WBC: 10.4 10*3/uL (ref 4.0–10.5)
nRBC: 0 % (ref 0.0–0.2)

## 2022-02-01 LAB — EXPECTORATED SPUTUM ASSESSMENT W GRAM STAIN, RFLX TO RESP C

## 2022-02-01 LAB — HEPARIN LEVEL (UNFRACTIONATED): Heparin Unfractionated: 0.3 IU/mL (ref 0.30–0.70)

## 2022-02-01 MED ORDER — APIXABAN 5 MG PO TABS: 10.0000 mg | ORAL_TABLET | Freq: Two times a day (BID) | ORAL | Status: DC

## 2022-02-01 MED ORDER — FUROSEMIDE 10 MG/ML IJ SOLN
40.0000 mg | Freq: Every day | INTRAMUSCULAR | Status: DC
  Administered 2022-02-02 – 2022-02-06 (×5): 40 mg via INTRAVENOUS
  Filled 2022-02-01 (×5): qty 4

## 2022-02-01 MED ORDER — PROSOURCE TF20 ENFIT COMPATIBL EN LIQD
60.0000 mL | Freq: Every day | ENTERAL | Status: DC
  Administered 2022-02-02 – 2022-02-13 (×6): 60 mL
  Filled 2022-02-01 (×10): qty 60

## 2022-02-01 MED ORDER — INSULIN ASPART 100 UNIT/ML IJ SOLN
0.0000 [IU] | INTRAMUSCULAR | Status: DC
  Administered 2022-02-02: 5 [IU] via SUBCUTANEOUS
  Administered 2022-02-02: 3 [IU] via SUBCUTANEOUS
  Administered 2022-02-02 (×2): 5 [IU] via SUBCUTANEOUS
  Administered 2022-02-02: 3 [IU] via SUBCUTANEOUS
  Administered 2022-02-02: 8 [IU] via SUBCUTANEOUS
  Administered 2022-02-04: 2 [IU] via SUBCUTANEOUS
  Administered 2022-02-05: 3 [IU] via SUBCUTANEOUS
  Administered 2022-02-05: 1 [IU] via SUBCUTANEOUS
  Administered 2022-02-06: 2 [IU] via SUBCUTANEOUS
  Administered 2022-02-06: 3 [IU] via SUBCUTANEOUS
  Administered 2022-02-07 (×2): 2 [IU] via SUBCUTANEOUS
  Administered 2022-02-08 (×3): 3 [IU] via SUBCUTANEOUS
  Administered 2022-02-09: 2 [IU] via SUBCUTANEOUS
  Administered 2022-02-10: 5 [IU] via SUBCUTANEOUS
  Administered 2022-02-10 – 2022-02-11 (×3): 3 [IU] via SUBCUTANEOUS
  Administered 2022-02-11: 8 [IU] via SUBCUTANEOUS
  Administered 2022-02-11 (×2): 3 [IU] via SUBCUTANEOUS
  Administered 2022-02-12: 2 [IU] via SUBCUTANEOUS
  Administered 2022-02-12: 3 [IU] via SUBCUTANEOUS
  Administered 2022-02-12: 5 [IU] via SUBCUTANEOUS
  Administered 2022-02-12: 2 [IU] via SUBCUTANEOUS
  Administered 2022-02-12 – 2022-02-13 (×3): 3 [IU] via SUBCUTANEOUS
  Administered 2022-02-13: 8 [IU] via SUBCUTANEOUS
  Administered 2022-02-13: 2 [IU] via SUBCUTANEOUS
  Administered 2022-02-13 – 2022-02-14 (×2): 3 [IU] via SUBCUTANEOUS
  Administered 2022-02-14 (×2): 2 [IU] via SUBCUTANEOUS
  Administered 2022-02-14: 3 [IU] via SUBCUTANEOUS
  Administered 2022-02-15: 2 [IU] via SUBCUTANEOUS
  Administered 2022-02-15: 3 [IU] via SUBCUTANEOUS
  Administered 2022-02-16: 5 [IU] via SUBCUTANEOUS

## 2022-02-01 MED ORDER — VENLAFAXINE HCL ER 150 MG PO CP24
150.0000 mg | ORAL_CAPSULE | Freq: Every day | ORAL | Status: DC
  Filled 2022-02-01: qty 1

## 2022-02-01 MED ORDER — APIXABAN 5 MG PO TABS
10.0000 mg | ORAL_TABLET | Freq: Two times a day (BID) | ORAL | Status: DC
  Administered 2022-02-01 – 2022-02-02 (×2): 10 mg
  Filled 2022-02-01 (×3): qty 2

## 2022-02-01 MED ORDER — APIXABAN 5 MG PO TABS: 5.0000 mg | ORAL_TABLET | Freq: Two times a day (BID) | ORAL | Status: DC

## 2022-02-01 MED ORDER — MIRTAZAPINE 15 MG PO TABS: 7.5000 mg | ORAL_TABLET | Freq: Every day | ORAL | Status: DC

## 2022-02-01 MED ORDER — SORBITOL 70 % SOLN
960.0000 mL | TOPICAL_OIL | Freq: Once | ORAL | Status: DC
  Filled 2022-02-01: qty 240

## 2022-02-01 MED ORDER — OSMOLITE 1.5 CAL PO LIQD
1000.0000 mL | ORAL | Status: DC
  Administered 2022-02-01 – 2022-02-09 (×3): 1000 mL
  Filled 2022-02-01 (×2): qty 1000

## 2022-02-01 MED ORDER — HEPARIN (PORCINE) 25000 UT/250ML-% IV SOLN
1000.0000 [IU]/h | INTRAVENOUS | Status: AC
  Administered 2022-02-01: 1000 [IU]/h via INTRAVENOUS
  Filled 2022-02-01: qty 250

## 2022-02-01 NOTE — NC FL2 (Signed)
[place] LEVEL OF CARE SCREENING TOOL     IDENTIFICATION  Patient Name: Cassandra Dodson Birthdate: 11-13-1940 Sex: female Admission Date (Current Location): 01/26/2022  [facility] and [place] Number:  Herbalist and Address:  [facility], [place] [address] [place], [place] [address]      Provider Number: [id number]  Attending Physician Name and Address:  Candee Furbish, MD  Relative Name and Phone Number:  Lindyn, Vossler [phone]    Current Level of Care: Hospital Recommended Level of Care: [place] Prior Approval Number:    Date Approved/Denied:   PASRR Number: [id number]  Discharge Plan: SNF    Current Diagnoses: Patient Active Problem List   Diagnosis Date Noted   Pulmonary embolism with acute cor pulmonale ([place]) 01/26/2022   AMS (altered mental status) 01/11/2022   Dysphagia 11/28/2021   Anemia 11/28/2021   Fluctuating mental status 11/28/2021   Right pontine cerebrovascular accident ([place]) 11/05/2021   Pressure injury of skin 11/02/2021   Occlusion and stenosis of basilar artery 11/01/2021   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   UTI (urinary tract infection) 10/27/2021   Intracranial mass    Hyperlipidemia    Type 2 diabetes mellitus with stage 3b chronic kidney disease, without long-term current use of insulin (HCC)    CVA (cerebral vascular accident) ([place]) 10/26/2021   Stage 3b chronic kidney disease (HCC)    Thrombocytopenia ([place])    History of endometrial cancer    Brain mass 10/25/2021   Endometrial cancer ([place]) 05/13/2019   Hypertension    Total knee replacement status 06/27/2016   Primary osteoarthritis of left knee 04/30/2016   Osteoarthritis of right hip 02/23/2015   Hip arthritis 02/23/2015    Orientation RESPIRATION BLADDER Height & Weight     Self, Situation, Place  Normal Incontinent, External catheter Weight: 185 lb 3 oz (84 kg) Height:  '5\' 3"'$  (160 cm)  BEHAVIORAL  SYMPTOMS/MOOD NEUROLOGICAL BOWEL NUTRITION STATUS      Incontinent Diet (see discharge summary)  AMBULATORY STATUS COMMUNICATION OF NEEDS Skin   Total Care Verbally Normal                       Personal Care Assistance Level of Assistance  Bathing, Feeding, Dressing, Total care Bathing Assistance: Maximum assistance Feeding assistance: Limited assistance Dressing Assistance: Maximum assistance Total Care Assistance: Maximum assistance   Functional Limitations Info  Sight, Hearing, Speech Sight Info: Adequate Hearing Info: Adequate Speech Info: Adequate    SPECIAL CARE FACTORS FREQUENCY  PT (By licensed PT), OT (By licensed OT)     PT Frequency: 5x week OT Frequency: 5x wee            Contractures Contractures Info: Not present    Additional Factors Info  Code Status, Allergies, Insulin Sliding Scale Code Status Info: full Allergies Info: Anesthetics, Amide, Ether   Insulin Sliding Scale Info: Novolog: see discharge summary       Current Medications (02/01/2022):  This is the current hospital active medication list Current Facility-Administered Medications  Medication Dose Route Frequency Provider Last Rate Last Admin   0.9 %  sodium chloride infusion   Intravenous PRN Candee Furbish, MD 10 mL/hr at 02/01/22 0800 Infusion Verify at 02/01/22 0800   acetaminophen (TYLENOL) tablet 650 mg  650 mg Per Tube Q4H PRN Hunsucker, Bonna Gains, MD   650 mg at 01/30/22 0844   albuterol (PROVENTIL) (2.5 MG/3ML) 0.083% nebulizer solution 2.5  mg  2.5 mg Nebulization Q4H PRN Leigh Aurora, DO       Chlorhexidine Gluconate Cloth 2 % PADS 6 each  6 each Topical Q0600 Hunsucker, Bonna Gains, MD   6 each at 02/01/22 1144   docusate (COLACE) 50 MG/5ML liquid 100 mg  100 mg Per Tube BID Candee Furbish, MD   100 mg at 01/31/22 1047   feeding supplement (OSMOLITE 1.5 CAL) liquid 1,000 mL  1,000 mL Per Tube Continuous Candee Furbish, MD       feeding supplement (PROSource TF20) liquid 60 mL   60 mL Per Tube Daily Candee Furbish, MD       [START ON 02/02/2022] furosemide (LASIX) injection 40 mg  40 mg Intravenous Daily Leigh Aurora, DO       heparin ADULT infusion 100 units/mL (25000 units/249m)  1,000 Units/hr Intravenous Continuous PLeigh Aurora DO 10 mL/hr at 02/01/22 1146 1,000 Units/hr at 02/01/22 1146   insulin aspart (novoLOG) injection 0-15 Units  0-15 Units Subcutaneous TID WC SCandee Furbish MD   2 Units at 02/01/22 1143   insulin aspart (novoLOG) injection 0-5 Units  0-5 Units Subcutaneous QHS SCandee Furbish MD       insulin glargine-yfgn ([facility] injection 15 Units  15 Units Subcutaneous QHS SCandee Furbish MD       meropenem ([facility] 1 g in sodium chloride 0.9 % 100 mL IVPB  1 g Intravenous Q8H SCandee Furbish MD   Stopped at 02/01/22 0640   ondansetron (ZOFRAN) injection 4 mg  4 mg Intravenous Q6H PRN Hunsucker, MBonna Gains MD   4 mg at 01/28/22 1311   Oral care mouth rinse  15 mL Mouth Rinse 4 times per day SCandee Furbish MD   15 mL at 02/01/22 1144   Oral care mouth rinse  15 mL Mouth Rinse PRN SCandee Furbish MD       pantoprazole (PROTONIX) 2 mg/mL oral suspension 40 mg  40 mg Per Tube Daily Hunsucker, MBonna Gains MD   40 mg at 01/31/22 1048   polyethylene glycol (MIRALAX / GLYCOLAX) packet 17 g  17 g Per Tube BID SCandee Furbish MD   17 g at 01/31/22 1048   rosuvastatin (CRESTOR) tablet 20 mg  20 mg Per Tube Daily SCandee Furbish MD   20 mg at 01/31/22 1047   senna (SENOKOT) tablet 17.2 mg  2 tablet Per Tube Daily SCandee Furbish MD   17.2 mg at 01/30/22 1930   sodium chloride flush (NS) 0.9 % injection 10-40 mL  10-40 mL Intracatheter Q12H SCandee Furbish MD   10 mL at 02/01/22 1055   sodium chloride flush (NS) 0.9 % injection 10-40 mL  10-40 mL Intracatheter PRN SCandee Furbish MD       sodium chloride HYPERTONIC 3 % nebulizer solution 4 mL  4 mL Nebulization Daily PLeigh Aurora DO   4 mL at 02/01/22 0753   sorbitol, milk of mag, mineral oil, glycerin  (SMOG) enema  960 mL Rectal Once SCandee Furbish MD         Discharge Medications: Please see discharge summary for a list of discharge medications.  Relevant Imaging Results:  Relevant Lab Results:   Additional Information SSN: [id number] Pt is vaccinated for covid with multiple boosters.  WJoanne Chars LCSW

## 2022-02-01 NOTE — Progress Notes (Signed)
Nutrition Follow-up  DOCUMENTATION CODES:   Non-severe (moderate) malnutrition in context of chronic illness  INTERVENTION:   Once Cortrak placed and placement is confirmed, initiate tube feeds: - Osmolite 1.5 @ 50 ml/hr (1200 ml/day) - PROSource TF20 60 ml daily  Tube feeding regimen provides 1880 kcal, 85 grams of protein, and 914 ml of H2O.   NUTRITION DIAGNOSIS:   Moderate Malnutrition related to chronic illness (meningioma) as evidenced by mild fat depletion, moderate muscle depletion.  New diagnosis after completion of NFPE  GOAL:   Patient will meet greater than or equal to 90% of their needs  Met via TF  MONITOR:   Diet advancement, Labs, Weight trends, TF tolerance, Skin, I & O's  REASON FOR ASSESSMENT:   Ventilator, Consult Enteral/tube feeding initiation and management  ASSESSMENT:   81 yo female admitted with massive pulmonary embolism with acute respiratory failure. PMH includes multiple CVAs, meningioma, endometrial cancer, HTN, DM, hypothyroidism, neuropathy.  10/09 - s/p IR thrombectomy 10/12 - extubated  Discussed pt with RN and during ICU rounds. Per CCM note, pt's son is not interested in PMT involvement at this time. Cortrak to be placed today. Plan to start continuous tube feeds. Discussed with RN; can start tube feeds at goal.  RD able to complete NFPE. Pt meets criteria for moderate malnutrition.  Pt with moderate pitting edema to BUE and BLE.  Medications reviewed and include: colace, IV lasix, SSI, semglee 15 units daily, protonix, miralax, senna, smog enema, IV abx, heparin drip  Labs reviewed: sodium 134, BUN 24, hemoglobin 8.8, platelets 106 CBG's: 101-146 x 24 hours  UOP: 1650 ml x 24 hours I/O's: +5.7 L since admit  NUTRITION - FOCUSED PHYSICAL EXAM:  Flowsheet Row Most Recent Value  Orbital Region Moderate depletion  Upper Arm Region Mild depletion  Thoracic and Lumbar Region Mild depletion  Buccal Region Mild depletion   Temple Region Mild depletion  Clavicle Bone Region Mild depletion  Clavicle and Acromion Bone Region Moderate depletion  Scapular Bone Region Mild depletion  Dorsal Hand Moderate depletion  Patellar Region Mild depletion  [more severe depletions may be masked by edema]  Anterior Thigh Region Mild depletion  Posterior Calf Region Mild depletion  Edema (RD Assessment) Moderate  [BUE, BLE]  Hair Reviewed  Eyes Reviewed  Mouth Reviewed  Skin Reviewed  Nails Reviewed       Diet Order:   Diet Order             Diet NPO time specified  Diet effective now                   EDUCATION NEEDS:   Not appropriate for education at this time  Skin:  Skin Assessment: Skin Integrity Issues: DTI: R buttocks Stage I: buttocks, L heel  Last BM:  no documented BM  Height:   Ht Readings from Last 1 Encounters:  01/26/22 5' 3"  (1.6 m)    Weight:   Wt Readings from Last 1 Encounters:  02/01/22 84 kg    Ideal Body Weight:  52.3 kg  BMI:  Body mass index is 32.8 kg/m.  Estimated Nutritional Needs:   Kcal:  1700-1900  Protein:  85-100 grams  Fluid:  1.7-1.9 L    Gustavus Bryant, MS, RD, LDN Inpatient Clinical Dietitian Please see AMiON for contact information.

## 2022-02-01 NOTE — Evaluation (Signed)
Clinical/Bedside Swallow Evaluation Patient Details  Name: Cassandra Dodson MRN: [id number] Date of Birth: 1940-12-31  Today's Date: 02/01/2022 Time: SLP Start Time (ACUTE ONLY): 5462 SLP Stop Time (ACUTE ONLY): 7035 SLP Time Calculation (min) (ACUTE ONLY): 13 min  Past Medical History:  Past Medical History:  Diagnosis Date   Arthritis    CKD (chronic kidney disease), stage III ([place])    patient denies   Depression    Diabetes mellitus without complication ([place])    Difficult intravenous access    Endometrial cancer ([place])    GERD (gastroesophageal reflux disease)    Headache    History of radiation therapy 11/04/2019-12/01/2019   Endometrial HDR; Dr. Gery Pray   Hypertension    Hypothyroidism    Neuromuscular disorder ([place])    neuropathy in feet   Osteoarthritis    PMB (postmenopausal bleeding)    PONV (postoperative nausea and vomiting)    severe nausea and vomiting after knee replacement 06-2014, did ok with 2018 knee replacement   Urinary frequency    Wears glasses    Past Surgical History:  Past Surgical History:  Procedure Laterality Date   BREAST SURGERY     cyst removed   CESAREAN SECTION     DILATION AND CURETTAGE OF UTERUS N/A 06/24/2019   Procedure: DILATATION AND CURETTAGE;  Surgeon: Lafonda Mosses, MD;  Location: [place];  Service: Gynecology;  Laterality: N/A;   FRACTURE SURGERY     right knee   INTRAUTERINE DEVICE (IUD) INSERTION N/A 06/24/2019   Procedure: INTRAUTERINE DEVICE (IUD) INSERTION MIRENA;  Surgeon: Lafonda Mosses, MD;  Location: [facility];  Service: Gynecology;  Laterality: N/A;   IR ANGIO INTRA EXTRACRAN SEL COM CAROTID INNOMINATE UNI R MOD SED  11/05/2021   IR ANGIO VERTEBRAL SEL VERTEBRAL UNI R MOD SED  11/05/2021   IR ANGIOGRAM PULMONARY BILATERAL SELECTIVE  01/28/2022   IR ANGIOGRAM SELECTIVE EACH ADDITIONAL VESSEL  01/28/2022   IR ANGIOGRAM SELECTIVE EACH ADDITIONAL VESSEL  01/28/2022   IR CT HEAD LTD   11/01/2021   IR INTRA CRAN STENT  11/01/2021   IR RADIOLOGIST EVAL & MGMT  12/19/2021   IR THROMBECT PRIM MECH INIT (INCLU) MOD SED  01/28/2022   IR THROMBECT PRIM MECH INIT (INCLU) MOD SED  01/28/2022   IR US GUIDE VASC ACCESS RIGHT  11/01/2021   IR US GUIDE VASC ACCESS RIGHT  01/28/2022   IRRIGATION AND DEBRIDEMENT SEBACEOUS CYST     JOINT REPLACEMENT Right    right   LYMPH NODE DISSECTION N/A 09/14/2019   Procedure: LYMPH NODE DISSECTION;  Surgeon: Lafonda Mosses, MD;  Location: [facility];  Service: Gynecology;  Laterality: N/A;   RADIOLOGY WITH ANESTHESIA N/A 11/01/2021   Procedure: ANGIOPLASTY/STENT;  Surgeon: Luanne Bras, MD;  Location: [place];  Service: Radiology;  Laterality: N/A;   RADIOLOGY WITH ANESTHESIA N/A 01/28/2022   Procedure: IR WITH ANESTHESIA;  Surgeon: Radiologist, Medication, MD;  Location: [place];  Service: Radiology;  Laterality: N/A;   ROBOTIC ASSISTED TOTAL HYSTERECTOMY WITH BILATERAL SALPINGO OOPHERECTOMY Bilateral 09/14/2019   Procedure: XI ROBOTIC ASSISTED TOTAL HYSTERECTOMY WITH BILATERAL SALPINGO OOPHORECTOMY;  Surgeon: Lafonda Mosses, MD;  Location: [facility];  Service: Gynecology;  Laterality: Bilateral;   SENTINEL NODE BIOPSY N/A 09/14/2019   Procedure: SENTINEL NODE BIOPSY;  Surgeon: Lafonda Mosses, MD;  Location: [facility];  Service: Gynecology;  Laterality: N/A;   teeth extration     TONSILLECTOMY     TOTAL  HIP ARTHROPLASTY Right 02/23/2015   Procedure: RIGHT TOTAL HIP ARTHROPLASTY ANTERIOR APPROACH;  Surgeon: Leandrew Koyanagi, MD;  Location: [place];  Service: Orthopedics;  Laterality: Right;   TOTAL KNEE ARTHROPLASTY Left 06/27/2016   Procedure: LEFT TOTAL KNEE ARTHROPLASTY;  Surgeon: Leandrew Koyanagi, MD;  Location: [place];  Service: Orthopedics;  Laterality: Left;   HPI:  Cassandra Dodson is a 81 y.o. 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 In the ED, work-up revealed CT head with stable meningioma measuring 5 cm and associated vasogenic edema and regional mass effect.  Underwent IR thrombectomy 10/9. FEES 10/2021 penetration thin and nectar, wet vocal quality throughout, D3/nectar recommended.    Assessment / Plan / Recommendation  Clinical Impression  Pt seen immediately following Cortrak placement via dietitian. Noted to have wet vocal quality, missing/poor dentition and required cues to attend during assessment. Pt has history of dysphagia with nectar thickened liquids recommended in July of 2023 following FEES. Today she exhibited signs of aspiration with immediate cough following water consistently. Puree trial consumed with timely appearing oral and pharyngeal phase via subjective measures only. Pt's swallow function needs to be assessed with instrumental assessment and recommended that she continue NPO status and follow up with MBS when able to be performed. ST will follow pt. SLP Visit Diagnosis: Dysphagia, unspecified (R13.10)    Aspiration Risk  Moderate aspiration risk    Diet Recommendation NPO   Medication Administration: Via alternative means    Other  Recommendations Oral Care Recommendations: Oral care QID    Recommendations for follow up therapy are one component of a multi-disciplinary discharge planning process, led by the attending physician.  Recommendations may be updated based on patient status, additional functional criteria and insurance authorization.  Follow up Recommendations Skilled nursing-short term rehab (<3 hours/day)      Assistance Recommended at Discharge    Functional Status Assessment Patient has had a recent decline in their functional status and demonstrates the ability to make significant improvements in function in a reasonable and predictable amount of  time.  Frequency and Duration min 2x/week  2 weeks       Prognosis Prognosis for Safe Diet Advancement: [place] Study   General Date of Onset: 01/26/22 HPI: Cassandra Dodson is a 81 y.o. female with medical history significant for early stage uterine cancer, hypertension, type 2 diabetes, hypothyroidism, GERD, CKD 3B, chronic anxiety/depression, osteoarthritis, CVA and meningioma with left-sided deficit and speech difficulty, who presented to [facility] ED due to confusion x 2 days. In the ED, work-up revealed CT head with stable meningioma measuring 5 cm and associated vasogenic edema and regional mass effect.  Underwent IR thrombectomy 10/9. FEES 10/2021 penetration thin and nectar, wet vocal quality throughout, D3/nectar recommended. Type of Study: Bedside Swallow Evaluation Previous Swallow Assessment:  (FEES 10/2021 D3/nectar) Diet Prior to this Study: NPO;NG Tube Temperature Spikes Noted: No Respiratory Status: Room air History of Recent Intubation:  (10/9 for procedure) Behavior/Cognition: Alert;Cooperative;Confused;Pleasant mood;Requires cueing Oral Cavity Assessment: Within Functional Limits Oral Care Completed by SLP: No Oral Cavity - Dentition: Poor condition;Missing dentition Vision: Functional for self-feeding Self-Feeding Abilities: Needs assist Patient Positioning: Upright in bed Baseline Vocal Quality: Wet Volitional Cough: Cognitively unable to elicit Volitional Swallow: Unable to elicit  Oral/Motor/Sensory Function Overall Oral Motor/Sensory Function: Within functional limits   Ice Chips Ice chips: Not tested   Thin Liquid Thin Liquid: Impaired Presentation: Spoon;Straw Oral Phase Impairments:  (none) Pharyngeal  Phase Impairments: Cough - Immediate    Nectar Thick Nectar Thick Liquid: Not tested   Honey Thick Honey Thick Liquid: Not tested   Puree Puree: Within functional limits   Solid     Solid: Not tested      [name] 02/01/2022,3:33  PM

## 2022-02-01 NOTE — TOC Initial Note (Signed)
Transition of Care [facility]) - Initial/Assessment Note    Patient Details  Name: Cassandra Dodson MRN: [id number] Date of Birth: 11-May-1940  Transition [facility]) CM/SW Contact:    [name], LCSW Phone Number: 02/01/2022, 2:24 PM  Clinical Narrative:           CSW met with pt and son [name] regarding DC recommendation for SNF.  Pt listed as oriented x3 but unable to participate in conversation.  Son is POA, provides all info.  Pt has been living alone in an apartment, was set up with [facility] after last admission.  Son now agreeable to SNF, choice document provided, permission given to send out referral in hub.  Pt is vaccinated for covid with multiple boosters.        Expected Discharge Plan: Skilled Nursing Facility Barriers to Discharge: Continued Medical Work up, SNF Pending bed offer   Patient Goals and CMS Choice   CMS Medicare.gov Compare Post Acute Care list provided to:: Patient Represenative (must comment) (son [name]) Choice offered to / list presented to : Adult Children  Expected Discharge Plan and Services Expected Discharge Plan: [place] In-house Referral: Clinical Social Work   Post Acute Care Choice: [place] Living arrangements for the past 2 months: [place]                                      Prior Living Arrangements/Services Living arrangements for the past 2 months: Single Family Home Lives with:: Self          Need for Family Participation in Patient Care: Yes (Comment) Care giver support system in place?: Yes (comment) Current home services: Home PT, Home OT (son not sure which [place] agency) Alvis Lemmings per epic) Criminal Activity/Legal Involvement Pertinent to Current Situation/Hospitalization: No - Comment as needed  Activities of Daily Living      Permission Sought/Granted                  Emotional Assessment Appearance:: Appears stated age Attitude/Demeanor/Rapport: Unable to  Assess Affect (typically observed): Unable to Assess Orientation: : Oriented to Self, Oriented to Place, Oriented to Situation Alcohol / Substance Use: Not Applicable Psych Involvement: No (comment)  Admission diagnosis:  Pulmonary embolism with acute cor pulmonale (HCC) [I26.09] Acute saddle pulmonary embolism with acute cor pulmonale (HCC) [I26.02] Patient Active Problem List   Diagnosis Date Noted   Pulmonary embolism with acute cor pulmonale ([place]) 01/26/2022   AMS (altered mental status) 01/11/2022   Dysphagia 11/28/2021   Anemia 11/28/2021   Fluctuating mental status 11/28/2021   Right pontine cerebrovascular accident ([place]) 11/05/2021   Pressure injury of skin 11/02/2021   Occlusion and stenosis of basilar artery 11/01/2021   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   UTI (urinary tract infection) 10/27/2021   Intracranial mass    Hyperlipidemia    Type 2 diabetes mellitus with stage 3b chronic kidney disease, without long-term current use of insulin (HCC)    CVA (cerebral vascular accident) ([place]) 10/26/2021   Stage 3b chronic kidney disease ([place])    Thrombocytopenia ([place])    History of endometrial cancer    Brain mass 10/25/2021   Endometrial cancer ([place]) 05/13/2019   Hypertension    Total knee replacement status 06/27/2016   Primary osteoarthritis of left knee 04/30/2016   Osteoarthritis of right hip 02/23/2015   Hip arthritis 02/23/2015   PCP:  Shon Baton, MD Pharmacy:   [facility] [place], [place] [address] Phone: [phone] Fax: [phone]  [facility] [place] [address] Phone: [phone] Fax: [phone]  [facility] [address] [place] [address] Phone: [phone] Fax: [phone]     Social Determinants of Health (SDOH) Interventions     Readmission Risk Interventions     No data to display

## 2022-02-01 NOTE — Progress Notes (Signed)
Pharmacy ICU Insulin Management 41 yoF with PMH HTN, T2DM, hypothyroidism, GERD, CKD, chronic anxiety/depression, osteoarthritis, left sided deficit and speech difficulty presented with obstructive PE. Patient has been consulted to be apart of the pharmacy insulin protocol.   Patient's CBG values in last 24 hours: 140-160s  Patient's current insulin regimen: insulin aspart 0-15 units every 4 hours and semglee 30 units QHS.  Plan Now that patient is extubated and no longer receiving tube feeds, will discontinue insulin protocol per pharmacy dosing. Discussed with team and lowered insulin semglee to home dose of 15 units QHS. Sliding scale in place. Tube feed coverage discontinued.  Pharmacy will sign off consult.   Sloan Leiter, PharmD, BCPS, BCCCP Clinical Pharmacist Please refer to [facility] for [facility] Pharmacy numbers

## 2022-02-01 NOTE — Inpatient Diabetes Management (Signed)
Inpatient Diabetes Program Recommendations  AACE/ADA: New Consensus Statement on Inpatient Glycemic Control (2015)  Target Ranges:  Prepandial:   less than 140 mg/dL      Peak postprandial:   less than 180 mg/dL (1-2 hours)      Critically ill patients:  140 - 180 mg/dL   Lab Results  Component Value Date   GLUCAP 101 (H) 02/01/2022   HGBA1C 6.0 (H) 01/16/2022    Review of Glycemic Control  Latest Reference Range & Units 01/31/22 15:43 01/31/22 19:22 01/31/22 23:34 02/01/22 03:36 02/01/22 07:46  Glucose-Capillary 70 - 99 mg/dL 107 (H) 123 (H) 119 (H) 104 (H) 101 (H)  (H): Data is abnormally high Diabetes history: DM  Outpatient Diabetes medications:  Lantus 15 units daily, Humalog 1 units tid with meals Current orders for Inpatient glycemic control:  Novolog 0-15 units TID & HS Semglee 15 units q HS   Inpatient Diabetes Program Recommendations:    Patient not appropriate for meals per speech note.   In this case, consider changing correction to Novolog 0-15 units Q4H and discontinuing Semglee.   Thanks, Bronson Curb, MSN, RNC-OB Diabetes Coordinator [phone] (8a-5p)

## 2022-02-01 NOTE — Progress Notes (Signed)
NAME:  Cassandra Dodson, MRN:  [id number], DOB:  Apr 25, 1940, LOS: 6 ADMISSION DATE:  01/26/2022, CONSULTATION DATE: 01/28/2022 REFERRING MD: EDP, CHIEF COMPLAINT: Altered mental status  History of Present Illness:  81 year old female with past medical history of meningioma and multiple CVAs most recently late 12/2021 admitted for altered mental status.  Patient found to have a pulmonary embolism.  Given altered mental status, patient required to be intubated.  Patient had low blood pressures requiring Levophed.  Patient was started on Lovenox for PE and admitted to ICU for further work-up.  Diabetes, meningioma, multiple CVAs  Pertinent  Medical History  Diabetes, meningioma, multiple CVAs  Significant Hospital Events: Including procedures, antibiotic start and stop dates in addition to other pertinent events   01/26/2022 brought to the ED with altered mental status, tachypnea, hypoxemia concerning for sepsis with septic shock.  CT angiogram shows saddle embolism.  Not a candidate for lytics or IR intervention due to meningioma and recent stroke.   01/27/2022-patient heparin held overnight given continuous bleeding from central venous catheter 10/11: Heparin resumed   Interim History / Subjective:  Overnight patient became more awake and alert.  This a.m. patient is alert and able to track me around the room.  Patient is also attempting to speak with me.  Patient denies any shortness of breath or chest pain this AM.  Patient has no other concerns at this time.  Objective   Blood pressure (!) 75/30, pulse 66, temperature 97.9 F (36.6 C), temperature source Oral, resp. rate 20, height '5\' 3"'$  (1.6 m), weight 84 kg, SpO2 97 %.    Vent Mode: PSV;CPAP FiO2 (%):  [30 %] 30 % Set Rate:  [28 bmp] 28 bmp Vt Set:  [410 mL] 410 mL PEEP:  [5 cmH20] 5 cmH20 Pressure Support:  [5 cmH20-10 cmH20] 5 cmH20 Plateau Pressure:  [21 cmH20] 21 cmH20   Intake/Output Summary (Last 24 hours) at 02/01/2022 0710 Last  data filed at 02/01/2022 0600 Gross per 24 hour  Intake 701.47 ml  Output 1650 ml  Net -948.53 ml    Filed Weights   01/29/22 0453 01/31/22 0500 02/01/22 0500  Weight: 84 kg 84 kg 84 kg    Examination: General: Patient is resting comfortably in bed upon my exam.  Patient is not on any supplemental oxygen. Eyes: Patient is able to track me across the room.  Noninjected conjunctiva Head: Normocephalic, atraumatic  Cardio: Regular rate and rhythm.  No murmurs rubs or gallops noted. Pulmonary: Coarse lung sounds throughout Abdomen: Soft, with normoactive bowel sounds Neuro: Alert and oriented x2.  Cranial nerves II through XII intact.  Able to follow commands.  Patient is verbal. Extremities: 2+ pitting edema noted to lower extremities. Skin: No oozing noted to femoral line site, line is out.  Resolved Hospital Problem list     Assessment & Plan:  This is a 81 year old female with a past medical history of a meningioma, diabetes, and multiple CVAs who presented with altered mental status.  Patient found to have pulmonary embolism with very low blood pressures requiring pressors.  Patient admitted to the ICU started on Lovenox and pressors, and admitted for further work-up.  #Acute metabolic encephalopathy, likely secondary to saddle massive pulmonary embolism and severe sepsis, resolving Patient now been extubated.  Patient is mentating well.  Patient is status post mechanical thrombectomy 4 days ago.  Patient is now more alert and able to follow commands.  Patient is off of all pressors and maintaining blood pressure  on her own.  Plan is to transition from heparin to Eliquis today.  Biggest concern at this time is patient's weakness, and will likely need PT OT -Patient is much better -Continue midodrine 10 mg every 8 hours -Plan to transfer to floor today -PT OT  #Klebsiella pneumoniae White blood cell count trending down.  Patient has been afebrile.  Patient does have minor cough  with some secretions. -Continue meropenem today as day 2 of 5 -Continue to monitor respiratory status  #Acute hypoxic respiratory failure likely secondary to above, resolved Patient has been extubated.  Patient is now weaned down to room air.  Patient saturating well on room air.  No concerns at this time. -Chest PT -Hypertonic nebs -Oxygenation goal greater than 92% -Monitor for signs proximal hypoxia  #Blood loss anemia #Thrombocytopenia Hemoglobin stable at 8.8 this AM.  Platelets down to 106 today.  Femoral line out.  Plan to switch from heparin to Eliquis today.  HIT antibodies pending. -Follow-up HIT antibodies -Transition from heparin to Eliquis  #Type 2 diabetes mellitus Patient's glucose measuring between 101-123.  Patient's current regimen includes Semglee 15 units daily, sliding scale and mealtime coverage.  Looks like it is at goal.  Pharmacy to continue to titrate insulin regimen to make sure goal is less than 180.  -Continue glucose monitoring -Pharmacy to titrate insulin   #Meningioma Stable at this time.  No intervention needed at this time. Will chart review, patient did not need steroids in the previous visit. Plan was for outpatient follow up. I do not see that there is an appointment for neurosurgery for the patient for outpatient follow up. If patient continues to improve and gets out of this critically ill state, will consider neurosurgery consult in house.  -Monitor  #Stage I sacral decubitus ulcer and heel ulcer #Present on admission Stable -Continue wound care  #Leukocytosis, resolved WBC down to 10.4 this AM.  -Continue to monitor -PICC line in place  #Elevated liver enzymes, resolving  AST 30, ALT 82. Improving  -Continue to monitor CMP  #Oliguria, resolving  Patient's Lasix is working with 2 L output yesterday.  We will transition to 40 mg Lasix daily.  Continue to monitor electrolytes on Lasix.  Kidney function is stable with creatinine at  0.65. -2 L output -Continue Lasix 40 mg daily -Trend BMP  Best Practice (right click and "Reselect all SmartList Selections" daily)   Diet/type: Plan for core track DVT prophylaxis: Heparin, transition to Eliquis GI prophylaxis: PPI Lines: Picc Foley:  [place] Code Status:  full code Family (son) updated at bedside  Labs   CBC: Recent Labs  Lab 01/26/22 1147 01/26/22 1151 01/29/22 0444 01/29/22 1732 01/30/22 0432 01/31/22 0326 02/01/22 0458  WBC 20.1*   < > 15.2* 13.4* 11.6* 11.0* 10.4  NEUTROABS 17.8*  --   --   --   --   --   --   HGB 12.0   < > 7.3* 9.0* 8.5* 8.5* 8.8*  HCT 38.0   < > 21.1* 25.2* 24.3* 25.5* 26.6*  MCV 96.4   < > 89.4 84.3 85.3 88.5 90.5  PLT 246   < > 212 119* 122* 108* 106*   < > = values in this interval not displayed.     Basic Metabolic Panel: Recent Labs  Lab 01/27/22 0219 01/28/22 0208 01/28/22 1325 01/29/22 0444 01/30/22 0432 01/31/22 0326 02/01/22 0458  NA 131* 133* 134* 132* 134* 136 134*  K 4.5 4.3 4.2 4.2 3.7 4.7 3.8  CL 99 104  --  103 104 101 99  CO2 20* 21*  --  21* '24 26 26  '$ GLUCOSE 290* 193*  --  355* 243* 185* 118*  BUN 24* 24*  --  33* 35* 31* 24*  CREATININE 0.99 0.81  --  1.03* 0.95 0.62 0.65  CALCIUM 9.8 9.4  --  8.4* 8.1* 8.3* 8.5*  MG 1.7 1.9  --   --   --   --   --   PHOS 3.6 2.4*  --   --   --   --   --     GFR: Estimated Creatinine Clearance: 55.6 mL/min (by C-G formula based on SCr of 0.65 mg/dL). Recent Labs  Lab 01/26/22 1147 01/26/22 1452 01/27/22 0219 01/27/22 0951 01/28/22 0208 01/29/22 1732 01/30/22 0432 01/30/22 1115 01/31/22 0326 02/01/22 0458  PROCALCITON  --   --   --   --   --   --   --  0.31  --   --   WBC 20.1*  --    < >  --    < > 13.4* 11.6*  --  11.0* 10.4  LATICACIDVEN 2.4* 2.8*  --  2.4*  --   --   --   --   --   --    < > = values in this interval not displayed.     Liver Function Tests: Recent Labs  Lab 01/26/22 1147 01/30/22 0432 01/31/22 0326 02/01/22 0458  AST  23 75* 37 30  ALT 16 160* 113* 82*  ALKPHOS 93 63 62 53  BILITOT 1.1 0.6 0.6 0.9  PROT 5.9* 4.3* 4.5* 4.9*  ALBUMIN 2.7* 2.1* 2.2* 2.3*    No results for input(s): "LIPASE", "AMYLASE" in the last 168 hours. No results for input(s): "AMMONIA" in the last 168 hours.  ABG    Component Value Date/Time   PHART 7.277 (L) 01/28/2022 1325   PCO2ART 53.2 (H) 01/28/2022 1325   PO2ART 422 (H) 01/28/2022 1325   HCO3 25.1 01/28/2022 1325   TCO2 27 01/28/2022 1325   ACIDBASEDEF 2.0 01/28/2022 1325   O2SAT 100 01/28/2022 1325     Coagulation Profile: No results for input(s): "INR", "PROTIME" in the last 168 hours.  Cardiac Enzymes: No results for input(s): "CKTOTAL", "CKMB", "CKMBINDEX", "TROPONINI" in the last 168 hours.  HbA1C: Hemoglobin A1C  Date/Time Value Ref Range Status  01/23/2015 12:00 AM 7.6  Final   Hgb A1c MFr Bld  Date/Time Value Ref Range Status  01/16/2022 08:52 AM 6.0 (H) 4.8 - 5.6 % Final    Comment:    (NOTE) Pre diabetes:          5.7%-6.4%  Diabetes:              >6.4%  Glycemic control for   <7.0% adults with diabetes   10/26/2021 02:25 AM 7.0 (H) 4.8 - 5.6 % Final    Comment:    (NOTE) Pre diabetes:          5.7%-6.4%  Diabetes:              >6.4%  Glycemic control for   <7.0% adults with diabetes     CBG: Recent Labs  Lab 01/31/22 0804 01/31/22 1125 01/31/22 1922 01/31/22 2334 02/01/22 0336  GLUCAP 191* 146* 123* 119* 104*     Review of Systems:   Negative except for what is stated in the HPI.   Past Medical History:  She,  has a  past medical history of Arthritis, CKD (chronic kidney disease), stage III ([place]), Depression, Diabetes mellitus without complication ([place]), Difficult intravenous access, Endometrial cancer ([place]), GERD (gastroesophageal reflux disease), Headache, History of radiation therapy (11/04/2019-12/01/2019), Hypertension, Hypothyroidism, Neuromuscular disorder ([place]), Osteoarthritis, PMB (postmenopausal bleeding), PONV  (postoperative nausea and vomiting), Urinary frequency, and Wears glasses.   Surgical History:   Past Surgical History:  Procedure Laterality Date   BREAST SURGERY     cyst removed   CESAREAN SECTION     DILATION AND CURETTAGE OF UTERUS N/A 06/24/2019   Procedure: DILATATION AND CURETTAGE;  Surgeon: Lafonda Mosses, MD;  Location: [facility];  Service: Gynecology;  Laterality: N/A;   FRACTURE SURGERY     right knee   INTRAUTERINE DEVICE (IUD) INSERTION N/A 06/24/2019   Procedure: INTRAUTERINE DEVICE (IUD) INSERTION MIRENA;  Surgeon: Lafonda Mosses, MD;  Location: [facility];  Service: Gynecology;  Laterality: N/A;   IR ANGIO INTRA EXTRACRAN SEL COM CAROTID INNOMINATE UNI R MOD SED  11/05/2021   IR ANGIO VERTEBRAL SEL VERTEBRAL UNI R MOD SED  11/05/2021   IR ANGIOGRAM PULMONARY BILATERAL SELECTIVE  01/28/2022   IR ANGIOGRAM SELECTIVE EACH ADDITIONAL VESSEL  01/28/2022   IR ANGIOGRAM SELECTIVE EACH ADDITIONAL VESSEL  01/28/2022   IR CT HEAD LTD  11/01/2021   IR INTRA CRAN STENT  11/01/2021   IR RADIOLOGIST EVAL & MGMT  12/19/2021   IR THROMBECT PRIM MECH INIT (INCLU) MOD SED  01/28/2022   IR THROMBECT PRIM MECH INIT (INCLU) MOD SED  01/28/2022   IR US GUIDE VASC ACCESS RIGHT  11/01/2021   IR US GUIDE VASC ACCESS RIGHT  01/28/2022   IRRIGATION AND DEBRIDEMENT SEBACEOUS CYST     JOINT REPLACEMENT Right    right   LYMPH NODE DISSECTION N/A 09/14/2019   Procedure: LYMPH NODE DISSECTION;  Surgeon: Lafonda Mosses, MD;  Location: [facility];  Service: Gynecology;  Laterality: N/A;   RADIOLOGY WITH ANESTHESIA N/A 11/01/2021   Procedure: ANGIOPLASTY/STENT;  Surgeon: Luanne Bras, MD;  Location: [place];  Service: Radiology;  Laterality: N/A;   RADIOLOGY WITH ANESTHESIA N/A 01/28/2022   Procedure: IR WITH ANESTHESIA;  Surgeon: Radiologist, Medication, MD;  Location: [place];  Service: Radiology;  Laterality: N/A;   ROBOTIC ASSISTED TOTAL HYSTERECTOMY WITH BILATERAL  SALPINGO OOPHERECTOMY Bilateral 09/14/2019   Procedure: XI ROBOTIC ASSISTED TOTAL HYSTERECTOMY WITH BILATERAL SALPINGO OOPHORECTOMY;  Surgeon: Lafonda Mosses, MD;  Location: [facility];  Service: Gynecology;  Laterality: Bilateral;   SENTINEL NODE BIOPSY N/A 09/14/2019   Procedure: SENTINEL NODE BIOPSY;  Surgeon: Lafonda Mosses, MD;  Location: [facility];  Service: Gynecology;  Laterality: N/A;   teeth extration     TONSILLECTOMY     TOTAL HIP ARTHROPLASTY Right 02/23/2015   Procedure: RIGHT TOTAL HIP ARTHROPLASTY ANTERIOR APPROACH;  Surgeon: Leandrew Koyanagi, MD;  Location: [place];  Service: Orthopedics;  Laterality: Right;   TOTAL KNEE ARTHROPLASTY Left 06/27/2016   Procedure: LEFT TOTAL KNEE ARTHROPLASTY;  Surgeon: Leandrew Koyanagi, MD;  Location: [place];  Service: Orthopedics;  Laterality: Left;     Social History:   reports that she quit smoking about 19 years ago. Her smoking use included cigarettes. She has a 67.50 pack-year smoking history. She has never used smokeless tobacco. She reports current alcohol use. She reports that she does not use drugs.   Family History:  Her family history includes Hypertension in her father; Pancreatic cancer in her mother; Stroke in her  father. There is no history of Colon cancer, Breast cancer, Ovarian cancer, Endometrial cancer, or Prostate cancer.   Allergies Allergies  Allergen Reactions   Anesthetics, Amide Other (See Comments)    Pt is intolerant to general anesthesia. Pt will throw up and has thrown up during procedure.    Ether Nausea And Vomiting     Home Medications  Prior to Admission medications   Medication Sig Start Date End Date Taking? Authorizing Provider  amLODipine (NORVASC) 5 MG tablet Take 1 tablet (5 mg total) by mouth daily. 01/18/22 02/17/22 Yes Pahwani, Einar Grad, MD  aspirin EC 81 MG tablet Take 1 tablet (81 mg total) by mouth daily for 16 days. Swallow whole. 01/18/22 02/03/22 Yes Pahwani, Einar Grad, MD  calcium carbonate (OS-CAL - DOSED IN  MG OF ELEMENTAL CALCIUM) 1250 (500 Ca) MG tablet Take 1 tablet (1,250 mg total) by mouth daily with breakfast. 11/28/21  Yes Love, Ivan Anchors, PA-C  clopidogrel (PLAVIX) 75 MG tablet Take 1 tablet (75 mg total) by mouth daily. 11/27/21  Yes Love, Ivan Anchors, PA-C  docusate sodium (COLACE) 100 MG capsule Take 1 capsule (100 mg total) by mouth 2 (two) times daily. 11/27/21  Yes Love, Ivan Anchors, PA-C  fenofibrate 160 MG tablet Take 1 tablet (160 mg total) by mouth daily after breakfast. 11/27/21  Yes Love, Ivan Anchors, PA-C  folic acid (FOLVITE) 1 MG tablet Take 1 tablet (1 mg total) by mouth daily. 11/27/21  Yes Love, Ivan Anchors, PA-C  insulin glargine (LANTUS) 100 UNIT/ML Solostar Pen Inject 15 Units into the skin daily. 01/17/22  Yes Pahwani, Einar Grad, MD  insulin lispro (HUMALOG) 100 UNIT/ML KwikPen Inject 1 Units into the skin 3 (three) times daily with meals. 11/27/21  Yes Love, Ivan Anchors, PA-C  methylphenidate (RITALIN) 5 MG tablet Take 1 tablet (5 mg total) by mouth 2 (two) times daily with breakfast and lunch. 11/27/21  Yes Love, Ivan Anchors, PA-C  Mouthwashes (MOUTH RINSE) LIQD solution 15 mLs by Mouth Rinse route as needed (for oral care). 11/05/21  Yes Sheikh, Omair Latif, DO  pantoprazole (PROTONIX) 40 MG tablet Take 1 tablet (40 mg total) by mouth daily. 11/27/21  Yes Love, Ivan Anchors, PA-C  rosuvastatin (CRESTOR) 20 MG tablet Take 1 tablet (20 mg total) by mouth daily. 11/27/21  Yes Love, Ivan Anchors, PA-C  senna (SENOKOT) 8.6 MG TABS tablet Take 13.2 mg by mouth at bedtime as needed for constipation. 12/18/21  Yes [provider]  traMADol (ULTRAM) 50 MG tablet Take 50 mg by mouth daily as needed for pain. 01/09/22  Yes [provider]  Zinc Oxide (DESITIN) 13 % CREA Apply 1 application  topically 2 (two) times daily. To buttocks for protection. 11/27/21  Yes Love, Ivan Anchors, PA-C  Insulin Pen Needle 32G X 4 MM MISC Use daily in the morning, at noon, and at bedtime. 11/27/21   Bary Leriche, PA-C     Critical care  time: [employer], DO Internal Medicine Resident PGY-1 Pager: [phone]

## 2022-02-01 NOTE — Progress Notes (Signed)
ANTICOAGULATION CONSULT NOTE -Follow Up  Pharmacy Consult for heparin ->apixaban Indication: pulmonary embolus  Allergies  Allergen Reactions   Anesthetics, Amide Other (See Comments)    Pt is intolerant to general anesthesia. Pt will throw up and has thrown up during procedure.    Ether Nausea And Vomiting    Patient Measurements: Height: '5\' 3"'$  (160 cm) Weight: 84 kg (185 lb 3 oz) IBW/kg (Calculated) : 52.4 Heparin Dosing Weight: 77 kg  Vital Signs: Temp: 98.9 F (37.2 C) (10/13 1305) Temp Source: Oral (10/13 1305) BP: 150/64 (10/13 1305) Pulse Rate: 88 (10/13 1305)  Labs: Recent Labs    01/30/22 0432 01/30/22 1905 01/31/22 0326 02/01/22 0458  HGB 8.5*  --  8.5* 8.8*  HCT 24.3*  --  25.5* 26.6*  PLT 122*  --  108* 106*  HEPARINUNFRC <0.10* 0.35 0.43 0.30  CREATININE 0.95  --  0.62 0.65     Estimated Creatinine Clearance: 55.6 mL/min (by C-G formula based on SCr of 0.65 mg/dL).   Medical History: Past Medical History:  Diagnosis Date   Arthritis    CKD (chronic kidney disease), stage III ([place])    patient denies   Depression    Diabetes mellitus without complication ([place])    Difficult intravenous access    Endometrial cancer ([place])    GERD (gastroesophageal reflux disease)    Headache    History of radiation therapy 11/04/2019-12/01/2019   Endometrial HDR; Dr. Gery Pray   Hypertension    Hypothyroidism    Neuromuscular disorder ([place])    neuropathy in feet   Osteoarthritis    PMB (postmenopausal bleeding)    PONV (postoperative nausea and vomiting)    severe nausea and vomiting after knee replacement 06-2014, did ok with 2018 knee replacement   Urinary frequency    Wears glasses      Assessment: 81 yo W with acute saddle PE with RHS 10/7 in the setting of meningioma and recent CVA. Patient was started on enoxaparin but had bleeding at CVC site s/p thrombi pad x3. No anticoagulation prior to admission. Pharmacy consulted for heparin.  He is now s/p  thrombectomy and heparin was restarted post procedure.   Pt now with cortrak place 10/13 (peripyloric on Xray) so will transition to apixaban.  Goal of Therapy:  Monitor platelets by anticoagulation protocol: Yes   Plan:  D/c heparin when first dose of apixaban given Apixaban '10mg'$  per tube BID x 7 days then on 10/22 with 2200 dose change to '5mg'$  per tube/po BID   Sherlon Handing, PharmD, BCPS Please see amion for complete clinical pharmacist phone list 02/01/2022 5:14 PM

## 2022-02-01 NOTE — Evaluation (Signed)
Physical Therapy Evaluation Patient Details Name: Cassandra Dodson MRN: [id number] DOB: 03-27-1941 Today's Date: 02/01/2022  History of Present Illness  81 y.o. female admitted 01/26/22 with AMS, patient found to have a pulmonary embolism. Recent admit 9/23 with with UTI. 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  Clinical Impression  Pt admitted with above diagnosis. Pt was able to sit EOB for up to 10 min with initial max assist progressing to min guard assist for seconds with rightlateral lean as well as posterior lean.  Pt needs cues to attend to left hemibody.  Upon lying pt back down, placed bed in chair position. Pt interactive with session and follows one step commands consistently.  Son sttates he cannot take pt home with caregivers as he had PTA therefore pt will need SNF with therapy.  Will follow acutely.  Pt currently with functional limitations due to the deficits listed below (see PT Problem List). Pt will benefit from skilled PT to increase their independence and safety with mobility to allow discharge to the venue listed below.          Recommendations for follow up therapy are one component of a multi-disciplinary discharge planning process, led by the attending physician.  Recommendations may be updated based on patient status, additional functional criteria and insurance authorization.  Follow Up Recommendations Skilled nursing-short term rehab (<3 hours/day) Can patient physically be transported by private vehicle: No    Assistance Recommended at Discharge Frequent or constant Supervision/Assistance  Patient can return home with the following  Two people to help with walking and/or transfers;Two people to help with bathing/dressing/bathroom;Assist for transportation;Assistance with cooking/housework;Direct supervision/assist for medications  management;Help with stairs or ramp for entrance    Equipment Recommendations None recommended by PT  Recommendations for Other Services       Functional Status Assessment Patient has had a recent decline in their functional status and demonstrates the ability to make significant improvements in function in a reasonable and predictable amount of time.     Precautions / Restrictions Precautions Precautions: Fall Precaution Comments: incontinence Restrictions Weight Bearing Restrictions: No      Mobility  Bed Mobility Overal bed mobility: Needs Assistance Bed Mobility: Supine to Sit, Sit to Supine     Supine to sit: Mod assist, +2 for physical assistance Sit to supine: Max assist, +2 for physical assistance   General bed mobility comments: pt with cueing for technique, assist for LB and trunk to sit EOB and scoot foward.  When fatigued required max assist +2 to return supine    Transfers Overall transfer level: Needs assistance Equipment used: Rolling walker (2 wheels), Ambulation equipment used Transfers: Sit to/from Stand Sit to Stand: Max assist, +2 physical assistance, Total assist, +2 safety/equipment, From elevated surface           General transfer comment: unable to achieve a full stand +2    Ambulation/Gait               General Gait Details: Unable  Stairs            Wheelchair Mobility    Modified Rankin (Stroke Patients Only)       Balance Overall balance assessment: Needs assistance Sitting-balance support: Feet unsupported, Bilateral upper extremity supported Sitting balance-Leahy Scale: Poor Sitting balance - Comments: initally min guard with unpredictable losses of balance posteriorly and to the R  requiring min to mod assist to correct toward midline. Postural control: Posterior lean, Right lateral lean Standing balance support: Bilateral upper extremity supported, During functional activity Standing balance-Leahy Scale:  Zero Standing balance comment: relies on external support, +2 assist required                             Pertinent Vitals/Pain Pain Assessment Pain Assessment: Faces Faces Pain Scale: Hurts even more Pain Location: Bilateral legs Pain Descriptors / Indicators: Discomfort, Grimacing, Guarding Pain Intervention(s): Limited activity within patient's tolerance, Monitored during session, Repositioned    Home Living Family/patient expects to be discharged to:: Private residence Living Arrangements: Children Available Help at Discharge: Family;Available 24 hours/day;Personal care attendant;Home health- son states he cant provide this any longer Type of Home: Apartment Home Access: Level entry       Home Layout: One level Home Equipment: Conservation officer, nature (2 wheels);Cane - single point;Shower seat;Other (comment);Wheelchair - manual;BSC/3in1;Hospital bed [facility] lift) Additional Comments: Patient unable to provide accurate history; noted from last stay on inpatient rehab 2 months ago, went home with hoyer lift and wheelchair at max A level. Son confirmed information.    Prior Function Prior Level of Function : Needs assist       Physical Assist : ADLs (physical)     Mobility Comments: Stand pivot with max assist to [facility] or W/c or used hoyer lift per son ADLs Comments: Patient requires assistance from her son or an aide to get dressed and sponge bathe at bedlevel. Aides and [place] during the day, he is there at nighttime.     Hand Dominance   Dominant Hand: Right    Extremity/Trunk Assessment   Upper Extremity Assessment Upper Extremity Assessment: Defer to OT evaluation RUE Deficits / Details: Grossly 3+/5 MMT LUE Deficits / Details: Weak from previous CVA (residual weakness in the chart) Edema in hand - able to perform extension and flexion of digits with visual and auditory cues - and elevated on pillow at end of session to assist with edema. LUE Sensation: decreased light  touch LUE Coordination: decreased fine motor;decreased gross motor    Lower Extremity Assessment Lower Extremity Assessment: RLE deficits/detail;LLE deficits/detail RLE Deficits / Details: AAROM WFL, but painful with full knee flexion, lifts leg antigravity, but not sustained LLE Deficits / Details: AAROM WFL, but painful with full knee flexion, does not lift antigravity    Cervical / Trunk Assessment Cervical / Trunk Assessment: Kyphotic  Communication   Communication: HOH;Expressive difficulties  Cognition Arousal/Alertness: Awake/alert Behavior During Therapy: WFL for tasks assessed/performed Overall Cognitive Status: Impaired/Different from baseline Area of Impairment: Orientation, Attention, Following commands, Memory, Safety/judgement, Awareness, Problem solving                 Orientation Level: Disoriented to, Situation, Time, Place Current Attention Level: Focused Memory: Decreased short-term memory Following Commands: Follows one step commands inconsistently Safety/Judgement: Decreased awareness of safety, Decreased awareness of deficits Awareness: Emergent Problem Solving: Slow processing, Decreased initiation, Requires verbal cues, Requires tactile cues General Comments: Initally follows simple commands with increased time but fades quickly when fatigued.  Appears to have inattention to left hemibody unless cued maximally.        General Comments General comments (skin integrity, edema, etc.): VSS    Exercises General Exercises - Lower Extremity Ankle Circles/Pumps: AROM, Both, 5 reps, Supine Heel Slides: AAROM, Both, 5 reps, Supine   Assessment/Plan    PT Assessment Patient needs continued PT  services  PT Problem List Decreased strength;Decreased balance;Decreased activity tolerance;Decreased mobility;Impaired tone;Decreased knowledge of use of DME;Decreased cognition       PT Treatment Interventions Functional mobility training;Therapeutic  activities;Patient/family education;Neuromuscular re-education;Balance training;Therapeutic exercise;Wheelchair mobility training    PT Goals (Current goals can be found in the Care Plan section)  Acute Rehab PT Goals Patient Stated Goal: none stated PT Goal Formulation: Patient unable to participate in goal setting Time For Goal Achievement: 02/15/22 Potential to Achieve Goals: Fair    Frequency Min 2X/week     Co-evaluation PT/OT/SLP Co-Evaluation/Treatment: Yes Reason for Co-Treatment: Complexity of the patient's impairments (multi-system involvement);For patient/therapist safety PT goals addressed during session: Mobility/safety with mobility OT goals addressed during session: ADL's and self-care       AM-PAC PT "6 Clicks" Mobility  Outcome Measure Help needed turning from your back to your side while in a flat bed without using bedrails?: Total Help needed moving from lying on your back to sitting on the side of a flat bed without using bedrails?: Total Help needed moving to and from a bed to a chair (including a wheelchair)?: Total Help needed standing up from a chair using your arms (e.g., wheelchair or bedside chair)?: Total Help needed to walk in hospital room?: Total Help needed climbing 3-5 steps with a railing? : Total 6 Click Score: 6    End of Session Equipment Utilized During Treatment: Gait belt Activity Tolerance: Patient limited by fatigue Patient left: in bed;with call bell/phone within reach;with bed alarm set Nurse Communication: Mobility status;Need for lift equipment PT Visit Diagnosis: Other abnormalities of gait and mobility (R26.89);Other symptoms and signs involving the nervous system (R29.898)    Time: 5361-4431 PT Time Calculation (min) (ACUTE ONLY): 22 min   Charges:   PT Evaluation $PT Eval Moderate Complexity: 1 Mod          Itzelle Gains M,PT Acute Rehab Services [phone]   Alvira Philips 02/01/2022, 1:14 PM

## 2022-02-01 NOTE — Evaluation (Signed)
Occupational Therapy Evaluation Patient Details Name: Cassandra Dodson MRN: [id number] DOB: July 17, 1940 Today's Date: 02/01/2022   History of Present Illness 81 y.o. female admitted 01/26/22 with AMS, patient found to have a pulmonary embolism. Recent admit 9/23 with with UTI. 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   Clinical Impression   Patient admitted for the diagnosis above.  PTA she was living at her son's apartment with PCA and [facility] services, as well as son's assist.  Patient was bathed and dressed at bedlevel, was able to participate with grooming and self feeding, and needed assist to transfer to [facility] and wheelchair.  Currently she is needing up to +2 for mobility and ADL care.  Son is unable to provide needed level of assist, so SNF is recommended for rehab trial prior to possible return home.  OT will follow in the acute setting.        Recommendations for follow up therapy are one component of a multi-disciplinary discharge planning process, led by the attending physician.  Recommendations may be updated based on patient status, additional functional criteria and insurance authorization.   Follow Up Recommendations  Skilled nursing-short term rehab (<3 hours/day)    Assistance Recommended at Discharge Frequent or constant Supervision/Assistance  Patient can return home with the following Two people to help with walking and/or transfers;A lot of help with bathing/dressing/bathroom;Assistance with feeding;Direct supervision/assist for medications management;Direct supervision/assist for financial management;Assist for transportation;Help with stairs or ramp for entrance    Functional Status Assessment  Patient has had a recent decline in their functional status and/or demonstrates limited ability to make significant improvements in function in a  reasonable and predictable amount of time  Equipment Recommendations  None recommended by OT    Recommendations for Other Services       Precautions / Restrictions Precautions Precautions: Fall Precaution Comments: incontinence Restrictions Weight Bearing Restrictions: No      Mobility Bed Mobility Overal bed mobility: Needs Assistance Bed Mobility: Supine to Sit, Sit to Supine     Supine to sit: Mod assist, +2 for physical assistance Sit to supine: Max assist, +2 for physical assistance        Transfers                   General transfer comment: unable to achieve a full stand +2      Balance Overall balance assessment: Needs assistance Sitting-balance support: Feet unsupported, Bilateral upper extremity supported Sitting balance-Leahy Scale: Poor   Postural control: Posterior lean, Right lateral lean Standing balance support: Bilateral upper extremity supported, During functional activity Standing balance-Leahy Scale: Zero                             ADL either performed or assessed with clinical judgement   ADL Overall ADL's : Needs assistance/impaired Eating/Feeding: NPO   Grooming: Moderate assistance;Bed level   Upper Body Bathing: Maximal assistance;Bed level   Lower Body Bathing: Total assistance;Bed level   Upper Body Dressing : Maximal assistance;Bed level   Lower Body Dressing: Total assistance;Bed level       Toileting- Clothing Manipulation and Hygiene: Total assistance;Bed level               Vision Patient Visual Report: No change from baseline Alignment/Gaze Preference: Head turned;Gaze right Additional Comments: Will turn her  head to the the left and can cross midline.     Perception Perception Perception: Impaired Inattention/Neglect: Does not attend to left visual field   Praxis Praxis Praxis: Not tested    Pertinent Vitals/Pain Pain Assessment Pain Assessment: Faces Faces Pain Scale: Hurts even  more Pain Location: Bilateral legs Pain Descriptors / Indicators: Discomfort, Grimacing, Guarding Pain Intervention(s): Monitored during session     Hand Dominance Right   Extremity/Trunk Assessment Upper Extremity Assessment Upper Extremity Assessment: LUE deficits/detail;RUE deficits/detail RUE Deficits / Details: Grossly 3+/5 MMT LUE Deficits / Details: Weak from previous CVA (residual weakness in the chart) Edema in hand - able to perform extension and flexion of digits with visual and auditory cues - and elevated on pillow at end of session to assist with edema. LUE Sensation: decreased light touch LUE Coordination: decreased fine motor;decreased gross motor   Lower Extremity Assessment Lower Extremity Assessment: Defer to PT evaluation   Cervical / Trunk Assessment Cervical / Trunk Assessment: Kyphotic   Communication Communication Communication: HOH;Expressive difficulties   Cognition Arousal/Alertness: Awake/alert Behavior During Therapy: WFL for tasks assessed/performed   Area of Impairment: Orientation, Attention, Following commands, Memory, Safety/judgement, Awareness, Problem solving                 Orientation Level: Disoriented to, Situation Current Attention Level: Focused Memory: Decreased short-term memory Following Commands: Follows one step commands inconsistently   Awareness: Emergent Problem Solving: Slow processing, Decreased initiation, Requires verbal cues, Requires tactile cues       General Comments   VSS on RA    Exercises     Shoulder Instructions      Home Living Family/patient expects to be discharged to:: Private residence Living Arrangements: Children Available Help at Discharge: Family;Available 24 hours/day;Personal care attendant;Home health Type of Home: Apartment Home Access: Level entry     Home Layout: One level     Bathroom Shower/Tub: Teacher, early years/pre: Standard Bathroom Accessibility:  Yes How Accessible: Accessible via walker Home Equipment: [place] (2 wheels);Cane - single point;Shower seat;Other (comment);Wheelchair - manual;BSC/3in1;Hospital bed [facility] lift)          Prior Functioning/Environment Prior Level of Function : Needs assist       Physical Assist : ADLs (physical)     Mobility Comments: Stand pivot with assist to [facility] or W/c ADLs Comments: Patient requires assistance from her son or an aide to get dressed and sponge bathe at bedlevel. Aides and [place] during the day, he is there at nighttime.        OT Problem List: Decreased strength;Decreased range of motion;Decreased activity tolerance;Impaired balance (sitting and/or standing);Decreased cognition;Decreased safety awareness;Obesity;Impaired sensation;Pain      OT Treatment/Interventions: Self-care/ADL training;Therapeutic exercise;Therapeutic activities;DME and/or AE instruction;Balance training;Patient/family education    OT Goals(Current goals can be found in the care plan section) Acute Rehab OT Goals OT Goal Formulation: Patient unable to participate in goal setting Time For Goal Achievement: 02/15/22 Potential to Achieve Goals: Good ADL Goals Pt Will Perform Eating: with set-up;sitting Pt Will Perform Grooming: with set-up;sitting Pt Will Perform Upper Body Bathing: with min assist;sitting Pt Will Perform Upper Body Dressing: with min assist;sitting Pt/caregiver will Perform Home Exercise Program: Increased strength;Right Upper extremity;Left upper extremity;With minimal assist  OT Frequency: Min 2X/week    Co-evaluation PT/OT/SLP Co-Evaluation/Treatment: Yes Reason for Co-Treatment: Complexity of the patient's impairments (multi-system involvement)   OT goals addressed during session: ADL's and self-care      AM-PAC OT "6 Clicks" Daily  Activity     Outcome Measure Help from another person eating meals?: Total Help from another person taking care of personal grooming?: A  Lot Help from another person toileting, which includes using toliet, bedpan, or urinal?: Total Help from another person bathing (including washing, rinsing, drying)?: A Lot Help from another person to put on and taking off regular upper body clothing?: A Lot Help from another person to put on and taking off regular lower body clothing?: Total 6 Click Score: 9   End of Session Nurse Communication: Mobility status  Activity Tolerance: Patient tolerated treatment well Patient left: in bed;with call bell/phone within reach;with bed alarm set  OT Visit Diagnosis: Unsteadiness on feet (R26.81);Other abnormalities of gait and mobility (R26.89);Muscle weakness (generalized) (M62.81);Other symptoms and signs involving the nervous system (R29.898);Other symptoms and signs involving cognitive function;Pain Pain - Right/Left: Left Pain - part of body: Knee;Leg                Time: 2500-3704 OT Time Calculation (min): 19 min Charges:  OT General Charges $OT Visit: 1 Visit OT Evaluation $OT Eval Moderate Complexity: 1 Mod  02/01/2022  RP, OTR/L  Acute Rehabilitation Services  Office:  [phone]   Metta Clines 02/01/2022, 11:23 AM

## 2022-02-01 NOTE — Procedures (Signed)
Cortrak  Person Inserting Tube:  Maylon Peppers C, RD Tube Type:  Cortrak - 43 inches Tube Size:  10 Tube Location:  Right nare Secured by: Bridle Technique Used to Measure Tube Placement:  Marking at nare/corner of mouth Cortrak Secured At:  67 cm   Cortrak Tube Team Note:  Consult received to place a Cortrak feeding tube.   X-ray is required, abdominal x-ray has been ordered by the Cortrak team. Please confirm tube placement before using the Cortrak tube.   If the tube becomes dislodged please keep the tube and contact the Cortrak team at [email or web address] for replacement.  If after hours and replacement cannot be delayed, place a NG tube and confirm placement with an abdominal x-ray.    Lockie Pares., RD, LDN, CNSC See AMiON for contact information

## 2022-02-02 DIAGNOSIS — E440 Moderate protein-calorie malnutrition: Secondary | ICD-10-CM | POA: Insufficient documentation

## 2022-02-02 DIAGNOSIS — I2602 Saddle embolus of pulmonary artery with acute cor pulmonale: Secondary | ICD-10-CM | POA: Diagnosis not present

## 2022-02-02 LAB — COMPREHENSIVE METABOLIC PANEL
ALT: 64 U/L — ABNORMAL HIGH (ref 0–44)
AST: 25 U/L (ref 15–41)
Albumin: 2.2 g/dL — ABNORMAL LOW (ref 3.5–5.0)
Alkaline Phosphatase: 59 U/L (ref 38–126)
Anion gap: 8 (ref 5–15)
BUN: 29 mg/dL — ABNORMAL HIGH (ref 8–23)
CO2: 28 mmol/L (ref 22–32)
Calcium: 9.2 mg/dL (ref 8.9–10.3)
Chloride: 100 mmol/L (ref 98–111)
Creatinine, Ser: 0.61 mg/dL (ref 0.44–1.00)
GFR, Estimated: >60 mL/min (ref >60)
Glucose, Bld: 265 mg/dL — ABNORMAL HIGH (ref 70–99)
Potassium: 3.4 mmol/L — ABNORMAL LOW (ref 3.5–5.1)
Sodium: 136 mmol/L (ref 135–145)
Total Bilirubin: 0.6 mg/dL (ref 0.3–1.2)
Total Protein: 5 g/dL — ABNORMAL LOW (ref 6.5–8.1)

## 2022-02-02 LAB — GLUCOSE, CAPILLARY
Glucose-Capillary: 203 mg/dL — ABNORMAL HIGH (ref 70–99)
Glucose-Capillary: 179 mg/dL — ABNORMAL HIGH (ref 70–99)
Glucose-Capillary: 204 mg/dL — ABNORMAL HIGH (ref 70–99)
Glucose-Capillary: 247 mg/dL — ABNORMAL HIGH (ref 70–99)
Glucose-Capillary: 280 mg/dL — ABNORMAL HIGH (ref 70–99)

## 2022-02-02 LAB — CBC
HCT: 27.2 % — ABNORMAL LOW (ref 36.0–46.0)
Hemoglobin: 8.6 g/dL — ABNORMAL LOW (ref 12.0–15.0)
MCH: 29.7 pg (ref 26.0–34.0)
MCHC: 31.6 g/dL (ref 30.0–36.0)
MCV: 93.8 fL (ref 80.0–100.0)
Platelets: 141 10*3/uL — ABNORMAL LOW (ref 150–400)
RBC: 2.9 MIL/uL — ABNORMAL LOW (ref 3.87–5.11)
RDW: 15.9 % — ABNORMAL HIGH (ref 11.5–15.5)
WBC: 11.5 10*3/uL — ABNORMAL HIGH (ref 4.0–10.5)
nRBC: 0 % (ref 0.0–0.2)

## 2022-02-02 LAB — HEPARIN INDUCED PLATELET AB (HIT ANTIBODY): Heparin Induced Plt Ab: 0.053 OD (ref 0.000–0.400)

## 2022-02-02 MED ORDER — SODIUM CHLORIDE 0.9 % IV SOLN: INTRAVENOUS | Status: AC

## 2022-02-02 MED ORDER — INSULIN GLARGINE-YFGN 100 UNIT/ML ~~LOC~~ SOLN
20.0000 [IU] | Freq: Every day | SUBCUTANEOUS | Status: DC
  Administered 2022-02-02: 20 [IU] via SUBCUTANEOUS
  Filled 2022-02-02 (×2): qty 0.2

## 2022-02-02 MED ORDER — POTASSIUM CHLORIDE 10 MEQ/100ML IV SOLN
10.0000 meq | INTRAVENOUS | Status: AC
  Administered 2022-02-02 (×4): 10 meq via INTRAVENOUS
  Filled 2022-02-02 (×4): qty 100

## 2022-02-02 NOTE — Discharge Instructions (Signed)
  Information on my medicine - ELIQUIS (apixaban)  This medication education was reviewed with me or my healthcare representative as part of my discharge preparation.  The pharmacist that spoke with me during my hospital stay was:  Dimple Nanas, [facility]  Why was Eliquis prescribed for you? Eliquis was prescribed to treat blood clots that may have been found in the veins of your legs (deep vein thrombosis) or in your lungs (pulmonary embolism) and to reduce the risk of them occurring again.  What do You need to know about Eliquis ? Your starting dose is 10 mg (two 5 mg tablets) taken TWICE daily for the THREE (3) DAYS, then on (enter date)  02/08/22  the dose is reduced to ONE 5 mg tablet taken TWICE daily.  Eliquis may be taken with or without food.   Try to take the dose about the same time in the morning and in the evening. If you have difficulty swallowing the tablet whole please discuss with your pharmacist how to take the medication safely.  Take Eliquis exactly as prescribed and DO NOT stop taking Eliquis without talking to the doctor who prescribed the medication.  Stopping may increase your risk of developing a new blood clot.  Refill your prescription before you run out.  After discharge, you should have regular check-up appointments with your healthcare provider that is prescribing your Eliquis.    What do you do if you miss a dose? If a dose of ELIQUIS is not taken at the scheduled time, take it as soon as possible on the same day and twice-daily administration should be resumed. The dose should not be doubled to make up for a missed dose.  Important Safety Information A possible side effect of Eliquis is bleeding. You should call your healthcare provider right away if you experience any of the following: Bleeding from an injury or your nose that does not stop. Unusual colored urine (red or dark [place]) or unusual colored stools (red or black). Unusual bruising for  unknown reasons. A serious fall or if you hit your head (even if there is no bleeding).  Some medicines may interact with Eliquis and might increase your risk of bleeding or clotting while on Eliquis. To help avoid this, consult your healthcare provider or pharmacist prior to using any new prescription or non-prescription medications, including herbals, vitamins, non-steroidal anti-inflammatory drugs (NSAIDs) and supplements.  This website has more information on Eliquis (apixaban): [email or web address].[email or web address]

## 2022-02-02 NOTE — Progress Notes (Signed)
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  Patient found to have a pulmonary embolism.  Given altered mental status, patient required to be intubated.  Patient had low blood pressures requiring Levophed.  Patient was started on Lovenox for PE and admitted to ICU for further work-up.  During hospital course, patient started bruising very badly, and hemoglobin started to drop after initiation of Lovenox, and anticoagulation was stopped.  Patient then had a mechanical thrombectomy once stable to retrieve clot.  Patient required 2 units of blood, and patient's hemoglobin remained stable afterwards.  Heparin was initiated, and patient's hemoglobin remains stable.  Patient was eventually weaned off ventilator back to room air.  Patient's blood pressures remained stable and no longer requiring pressors.  Patient is currently requiring antibiotics for pneumonia. Patient's platelets during hospitalization have started to drop, and HIT antibodies have been ordered.  Patient was transitioned from heparin to Eliquis after core track is placed.  PT OT recommends SNF.  Patient was transferred to [facility] on 02/02/2022.  Assessment & Plan:   Principal Problem:   Pulmonary embolism with acute cor pulmonale (HCC) Active Problems:   Brain mass   CVA (cerebral vascular accident) ([place])   Pressure injury of skin   Malnutrition of moderate degree  Acute metabolic encephalopathy, likely secondary to saddle massive pulmonary embolism and severe sepsis, resolving Patient now been extubated.  Patient is status post mechanical thrombectomy 01/28/2022.  Patient is off of all pressors and maintaining blood pressure on her own.   -Continue midodrine 10 mg every 8 hours.  She was transitioned to Eliquis.  Currently  she is oriented to only self.   Hypokalemia: We will replace.  #Acute hypoxic respiratory failure secondary to PE and Klebsiella pneumoniae: Improving.  Afebrile.  Very mild leukocytosis.  She is on room air.  Continue meropenem for 5 days.  Has history of ESBL.  Continue bronchodilators.   #Blood loss anemia: Likely secondary to anticoagulation.  Patient's hemoglobin suddenly dropped from 8.5-7.3 on 01/29/2022 and she received 2 units of PRBC transfusion.  Anticoagulation was stopped briefly.  It has been resumed ever since.  Hemoglobin has remained stable between 8 and 9 ever since.  Monitor closely.  #Thrombocytopenia: Numbers improving, 141 today.  No signs of bleeding.  HIT antibodies pending.  Continue Eliquis.  #Type 2 diabetes mellitus: Currently on Semglee 15 units, blood sugar slightly elevated, will increase to 20 units and continue SSI.   #Meningioma Stable at this time.  No intervention needed at this time.  Follow-up with neurosurgery as outpatient.   #Stage I sacral decubitus ulcer and heel ulcer #Present on admission Stable -Continue wound care   #Elevated liver enzymes, resolving   Dysphagia/poor p.o. intake: Has core track and getting tube feedings.  SLP following.  DVT prophylaxis:   Eliquis   Code Status: Full Code  Family Communication:  None present at bedside.  Plan of care discussed with patient in length and he/she verbalized understanding and agreed with it.  Status is: Inpatient Remains inpatient appropriate because: Patient with dysphagia and require SNF.   Estimated body mass index is 32.8 kg/m as calculated from the following:   Height as of this encounter: '5\' 3"'$  (1.6 m).   Weight as of this encounter: 84 kg.  Pressure Injury 01/26/22  Buttocks Mid Stage 1 -  Intact skin with non-blanchable redness of a localized area usually over a bony prominence. (Active)  01/26/22 1549  Location: Buttocks  Location Orientation: Mid  Staging: Stage 1 -  Intact  skin with non-blanchable redness of a localized area usually over a bony prominence.  Wound Description (Comments):   Present on Admission: Yes  Dressing Type Foam - Lift dressing to assess site every shift 02/01/22 2100     Pressure Injury 01/26/22 Heel Left Stage 1 -  Intact skin with non-blanchable redness of a localized area usually over a bony prominence. (Active)  01/26/22 1500  Location: Heel  Location Orientation: Left  Staging: Stage 1 -  Intact skin with non-blanchable redness of a localized area usually over a bony prominence.  Wound Description (Comments):   Present on Admission: Yes  Dressing Type Foam - Lift dressing to assess site every shift 02/01/22 2100     Pressure Injury 01/28/22 Buttocks Right Deep Tissue Pressure Injury - Purple or maroon localized area of discolored intact skin or blood-filled blister due to damage of underlying soft tissue from pressure and/or shear. (Active)  01/28/22 1642  Location: Buttocks  Location Orientation: Right  Staging: Deep Tissue Pressure Injury - Purple or maroon localized area of discolored intact skin or blood-filled blister due to damage of underlying soft tissue from pressure and/or shear.  Wound Description (Comments):   Present on Admission:   Dressing Type Foam - Lift dressing to assess site every shift 02/01/22 2100   Nutritional Assessment: Body mass index is 32.8 kg/m.Marland Kitchen Seen by dietician.  I agree with the assessment and plan as outlined below: Nutrition Status: Nutrition Problem: Moderate Malnutrition Etiology: chronic illness (meningioma) Signs/Symptoms: mild fat depletion, moderate muscle depletion Interventions: Tube feeding  . Skin Assessment: I have examined the patient's skin and I agree with the wound assessment as performed by the wound care RN as outlined below: Pressure Injury 01/26/22 Buttocks Mid Stage 1 -  Intact skin with non-blanchable redness of a localized area usually over a bony prominence.  (Active)  01/26/22 1549  Location: Buttocks  Location Orientation: Mid  Staging: Stage 1 -  Intact skin with non-blanchable redness of a localized area usually over a bony prominence.  Wound Description (Comments):   Present on Admission: Yes  Dressing Type Foam - Lift dressing to assess site every shift 02/01/22 2100     Pressure Injury 01/26/22 Heel Left Stage 1 -  Intact skin with non-blanchable redness of a localized area usually over a bony prominence. (Active)  01/26/22 1500  Location: Heel  Location Orientation: Left  Staging: Stage 1 -  Intact skin with non-blanchable redness of a localized area usually over a bony prominence.  Wound Description (Comments):   Present on Admission: Yes  Dressing Type Foam - Lift dressing to assess site every shift 02/01/22 2100     Pressure Injury 01/28/22 Buttocks Right Deep Tissue Pressure Injury - Purple or maroon localized area of discolored intact skin or blood-filled blister due to damage of underlying soft tissue from pressure and/or shear. (Active)  01/28/22 1642  Location: Buttocks  Location Orientation: Right  Staging: Deep Tissue Pressure Injury - Purple or maroon localized area of discolored intact skin or blood-filled blister due to damage of underlying soft tissue from pressure and/or shear.  Wound Description (Comments):   Present on Admission:   Dressing Type Foam - Lift dressing to assess site every shift 02/01/22 2100    Consultants:  IR  Procedures:  As above  Antimicrobials:  Anti-infectives (From admission, onward)    Start     Dose/Rate Route Frequency Ordered Stop   01/31/22 2100  meropenem (MERREM) 1 g in sodium chloride 0.9 % 100 mL IVPB        1 g 200 mL/hr over 30 Minutes Intravenous Every 8 hours 01/31/22 1656 02/07/22 2359   01/31/22 1445  ceFEPIme (MAXIPIME) 2 g in sodium chloride 0.9 % 100 mL IVPB  Status:  Discontinued        2 g 200 mL/hr over 30 Minutes Intravenous Every 12 hours 01/31/22 1359  01/31/22 1656   01/27/22 2200  ceFEPIme (MAXIPIME) 2 g in sodium chloride 0.9 % 100 mL IVPB  Status:  Discontinued        2 g 200 mL/hr over 30 Minutes Intravenous Every 12 hours 01/27/22 1410 01/29/22 1200   01/26/22 2000  ceFEPIme (MAXIPIME) 2 g in sodium chloride 0.9 % 100 mL IVPB  Status:  Discontinued        2 g 200 mL/hr over 30 Minutes Intravenous Every 8 hours 01/26/22 1316 01/27/22 1410   01/26/22 1030  ceFEPIme (MAXIPIME) 2 g in sodium chloride 0.9 % 100 mL IVPB        2 g 200 mL/hr over 30 Minutes Intravenous  Once 01/26/22 1015 01/26/22 1317   01/26/22 1030  vancomycin (VANCOREADY) IVPB 2000 mg/400 mL  Status:  Discontinued        2,000 mg 200 mL/hr over 120 Minutes Intravenous  Once 01/26/22 1015 01/26/22 1629         Subjective: Patient seen and examined.  She is alert and oriented to self.  She has no complaints.  Appears comfortable.  She is on room air.  Objective: Vitals:   02/01/22 1305 02/01/22 2039 02/02/22 0042 02/02/22 0543  BP: (!) 150/64 (!) 140/76 134/70 (!) 162/81  Pulse: 88 77 95   Resp: '20 20 16 15  '$ Temp: 98.9 F (37.2 C) 98.3 F (36.8 C)    TempSrc: Oral Oral    SpO2: 100% 99% 100% 100%  Weight:      Height:        Intake/Output Summary (Last 24 hours) at 02/02/2022 0823 Last data filed at 02/01/2022 2236 Gross per 24 hour  Intake 136.48 ml  Output 600 ml  Net -463.52 ml   Filed Weights   01/29/22 0453 01/31/22 0500 02/01/22 0500  Weight: 84 kg 84 kg 84 kg    Examination:  General exam: Appears calm and comfortable, obese Respiratory system: Breath sounds due to poor inspiratory effort. Cardiovascular system: S1 & S2 heard, RRR. No JVD, murmurs, rubs, gallops or clicks. No pedal edema. Gastrointestinal system: Abdomen is nondistended, soft and nontender. No organomegaly or masses felt. Normal bowel sounds heard. Central nervous system: Alert and oriented x1. No focal neurological deficits. Extremities: Symmetric 5 x 5 power. Skin:  No rashes, lesions or ulcers Psychiatry: Judgement and insight appear normal. Mood & affect appropriate.    Data Reviewed: I have personally reviewed following labs and imaging studies  CBC: Recent Labs  Lab 01/26/22 1147 01/26/22 1151 01/29/22 1732 01/30/22 0432 01/31/22 0326 02/01/22 0458 02/02/22 0307  WBC 20.1*   < > 13.4* 11.6* 11.0* 10.4 11.5*  NEUTROABS 17.8*  --   --   --   --   --   --   HGB 12.0   < > 9.0* 8.5* 8.5* 8.8* 8.6*  HCT 38.0   < >  25.2* 24.3* 25.5* 26.6* 27.2*  MCV 96.4   < > 84.3 85.3 88.5 90.5 93.8  PLT 246   < > 119* 122* 108* 106* 141*   < > = values in this interval not displayed.   Basic Metabolic Panel: Recent Labs  Lab 01/27/22 0219 01/28/22 0208 01/28/22 1325 01/29/22 0444 01/30/22 0432 01/31/22 0326 02/01/22 0458 02/02/22 0307  NA 131* 133*   < > 132* 134* 136 134* 136  K 4.5 4.3   < > 4.2 3.7 4.7 3.8 3.4*  CL 99 104  --  103 104 101 99 100  CO2 20* 21*  --  21* '24 26 26 28  '$ GLUCOSE 290* 193*  --  355* 243* 185* 118* 265*  BUN 24* 24*  --  33* 35* 31* 24* 29*  CREATININE 0.99 0.81  --  1.03* 0.95 0.62 0.65 0.61  CALCIUM 9.8 9.4  --  8.4* 8.1* 8.3* 8.5* 9.2  MG 1.7 1.9  --   --   --   --   --   --   PHOS 3.6 2.4*  --   --   --   --   --   --    < > = values in this interval not displayed.   GFR: Estimated Creatinine Clearance: 55.6 mL/min (by C-G formula based on SCr of 0.61 mg/dL). Liver Function Tests: Recent Labs  Lab 01/26/22 1147 01/30/22 0432 01/31/22 0326 02/01/22 0458 02/02/22 0307  AST 23 75* 37 30 25  ALT 16 160* 113* 82* 64*  ALKPHOS 93 63 62 53 59  BILITOT 1.1 0.6 0.6 0.9 0.6  PROT 5.9* 4.3* 4.5* 4.9* 5.0*  ALBUMIN 2.7* 2.1* 2.2* 2.3* 2.2*   No results for input(s): "LIPASE", "AMYLASE" in the last 168 hours. No results for input(s): "AMMONIA" in the last 168 hours. Coagulation Profile: No results for input(s): "INR", "PROTIME" in the last 168 hours. Cardiac Enzymes: No results for input(s): "CKTOTAL",  "CKMB", "CKMBINDEX", "TROPONINI" in the last 168 hours. BNP (last 3 results) No results for input(s): "PROBNP" in the last 8760 hours. HbA1C: No results for input(s): "HGBA1C" in the last 72 hours. CBG: Recent Labs  Lab 02/01/22 1114 02/01/22 1606 02/01/22 2221 02/01/22 2352 02/02/22 0410  GLUCAP 133* 106* 191* 181* 203*   Lipid Profile: No results for input(s): "CHOL", "HDL", "LDLCALC", "TRIG", "CHOLHDL", "LDLDIRECT" in the last 72 hours. Thyroid Function Tests: No results for input(s): "TSH", "T4TOTAL", "FREET4", "T3FREE", "THYROIDAB" in the last 72 hours. Anemia Panel: No results for input(s): "VITAMINB12", "FOLATE", "FERRITIN", "TIBC", "IRON", "RETICCTPCT" in the last 72 hours. Sepsis Labs: Recent Labs  Lab 01/26/22 1147 01/26/22 1452 01/27/22 0951 01/30/22 1115  PROCALCITON  --   --   --  0.31  LATICACIDVEN 2.4* 2.8* 2.4*  --     Recent Results (from the past 240 hour(s))  Resp Panel by RT-PCR (Flu A&B, Covid) Anterior Nasal Swab     Status: None   Collection Time: 01/26/22  9:10 AM   Specimen: Anterior Nasal Swab  Result Value Ref Range Status   SARS Coronavirus 2 by RT PCR NEGATIVE NEGATIVE Final    Comment: (NOTE) SARS-CoV-2 target nucleic acids are NOT DETECTED.  The SARS-CoV-2 RNA is generally detectable in upper respiratory specimens during the acute phase of infection. The lowest concentration of SARS-CoV-2 viral copies this assay can detect is 138 copies/mL. A negative result does not preclude SARS-Cov-2 infection and should not be used as the sole basis for treatment  or other patient management decisions. A negative result may occur with  improper specimen collection/handling, submission of specimen other than nasopharyngeal swab, presence of viral mutation(s) within the areas targeted by this assay, and inadequate number of viral copies(<138 copies/mL). A negative result must be combined with clinical observations, patient history, and  epidemiological information. The expected result is Negative.  Fact Sheet for Patients:  EntrepreneurPulse.com.au  Fact Sheet for Healthcare Providers:  IncredibleEmployment.be  This test is no t yet approved or cleared by the [place] FDA and  has been authorized for detection and/or diagnosis of SARS-CoV-2 by FDA under an Emergency Use Authorization (EUA). This EUA will remain  in effect (meaning this test can be used) for the duration of the COVID-19 declaration under Section 564(b)(1) of the Act, 21 U.S.C.section 360bbb-3(b)(1), unless the authorization is terminated  or revoked sooner.       Influenza A by PCR NEGATIVE NEGATIVE Final   Influenza B by PCR NEGATIVE NEGATIVE Final    Comment: (NOTE) The Xpert Xpress SARS-CoV-2/FLU/RSV plus assay is intended as an aid in the diagnosis of influenza from Nasopharyngeal swab specimens and should not be used as a sole basis for treatment. Nasal washings and aspirates are unacceptable for Xpert Xpress SARS-CoV-2/FLU/RSV testing.  Fact Sheet for Patients: EntrepreneurPulse.com.au  Fact Sheet for Healthcare Providers: IncredibleEmployment.be  This test is not yet approved or cleared by the [place] FDA and has been authorized for detection and/or diagnosis of SARS-CoV-2 by FDA under an Emergency Use Authorization (EUA). This EUA will remain in effect (meaning this test can be used) for the duration of the COVID-19 declaration under Section 564(b)(1) of the Act, 21 U.S.C. section 360bbb-3(b)(1), unless the authorization is terminated or revoked.  Performed at [facility], [place] [address], [place], [place] [address]   Culture, blood (routine x 2)     Status: None   Collection Time: 01/26/22 11:38 AM   Specimen: BLOOD  Result Value Ref Range Status   Specimen Description BLOOD  Final   Special Requests   Final    BOTTLES DRAWN AEROBIC AND  ANAEROBIC Blood Culture adequate volume   Culture   Final    NO GROWTH 5 DAYS Performed at [facility], [place] [address], [place], [place] [address]    Report Status 01/31/2022 FINAL  Final  MRSA Next Gen by PCR, Nasal     Status: None   Collection Time: 01/26/22  2:36 PM   Specimen: Nasal Mucosa; Nasal Swab  Result Value Ref Range Status   MRSA by PCR Next Gen NOT DETECTED NOT DETECTED Final    Comment: (NOTE) The GeneXpert MRSA Assay (FDA approved for NASAL specimens only), is one component of a comprehensive MRSA colonization surveillance program. It is not intended to diagnose MRSA infection nor to guide or monitor treatment for MRSA infections. Test performance is not FDA approved in patients less than 75 years old. Performed at [facility], [place] [address] [place], [place] [address]   Urine Culture     Status: None   Collection Time: 01/26/22  2:52 PM   Specimen: Urine, Catheterized  Result Value Ref Range Status   Specimen Description URINE, CATHETERIZED  Final   Special Requests NONE  Final   Culture   Final    NO GROWTH Performed at [facility], [address] [place], [place] [address]    Report Status 01/27/2022 FINAL  Final  Expectorated Sputum Assessment w Gram  Stain, Rflx to Resp Cult     Status: None   Collection Time: 01/30/22  9:49 AM   Specimen: Expectorated Sputum  Result Value Ref Range Status   Specimen Description EXPECTORATED SPUTUM  Final   Special Requests NONE  Final   Sputum evaluation   Final    THIS SPECIMEN IS ACCEPTABLE FOR SPUTUM CULTURE Performed at [facility], [place] [address] [place], [place] [address]    Report Status 02/01/2022 FINAL  Final  Culture, Respiratory w Gram Stain     Status: None   Collection Time: 01/30/22  9:49 AM  Result Value Ref Range Status   Specimen Description EXPECTORATED SPUTUM  Final   Special Requests NONE Reflexed from [id number]  Final   Gram Stain   Final    MODERATE WBC  PRESENT,BOTH PMN AND MONONUCLEAR FEW GRAM POSITIVE COCCI IN CLUSTERS FEW GRAM NEGATIVE RODS FEW YEAST WITH PSEUDOHYPHAE Performed at [facility], [place] [address], [place], [place] [address]    Culture   Final    MODERATE KLEBSIELLA PNEUMONIAE Confirmed Extended Spectrum Beta-Lactamase Producer (ESBL).  In bloodstream infections from ESBL organisms, carbapenems are preferred over piperacillin/tazobactam. They are shown to have a lower risk of mortality.    Report Status 02/01/2022 FINAL  Final   Organism ID, Bacteria KLEBSIELLA PNEUMONIAE  Final      Susceptibility   Klebsiella pneumoniae - MIC*    AMPICILLIN >=32 RESISTANT Resistant     CEFAZOLIN >=64 RESISTANT Resistant     CEFEPIME >=32 RESISTANT Resistant     CEFTAZIDIME RESISTANT Resistant     CEFTRIAXONE >=64 RESISTANT Resistant     CIPROFLOXACIN <=0.25 SENSITIVE Sensitive     GENTAMICIN <=1 SENSITIVE Sensitive     IMIPENEM <=0.25 SENSITIVE Sensitive     TRIMETH/SULFA >=320 RESISTANT Resistant     AMPICILLIN/SULBACTAM >=32 RESISTANT Resistant     PIP/TAZO 16 SENSITIVE Sensitive     * MODERATE KLEBSIELLA PNEUMONIAE     Radiology Studies: DG Abd Portable 1V  Result Date: 02/01/2022 CLINICAL DATA:  Feeding tube placement EXAM: PORTABLE ABDOMEN - 1 VIEW COMPARISON:  CT abdomen and pelvis 01/30/2019 FINDINGS: The bowel gas pattern is normal. Weighted enteric tube terminates in the Peri pyloric region. Gas distended stomach. Degenerative changes in the visualized lumbar spine. IMPRESSION: Weighted enteric tube terminates in the peripyloric region. Electronically Signed   By: Marin Roberts M.D.   On: 02/01/2022 15:54    Scheduled Meds:  apixaban  10 mg Per Tube BID   Followed by   Derrill Memo ON 02/08/2022] apixaban  5 mg Per Tube BID   Chlorhexidine Gluconate Cloth  6 each Topical Q0600   docusate  100 mg Per Tube BID   feeding supplement (PROSource TF20)  60 mL Per Tube Daily   furosemide  40 mg Intravenous Daily   insulin  aspart  0-15 Units Subcutaneous Q4H   insulin aspart  0-5 Units Subcutaneous QHS   insulin glargine-yfgn  15 Units Subcutaneous QHS   mouth rinse  15 mL Mouth Rinse 4 times per day   pantoprazole  40 mg Per Tube Daily   polyethylene glycol  17 g Per Tube BID   rosuvastatin  20 mg Per Tube Daily   senna  2 tablet Per Tube Daily   sodium chloride flush  10-40 mL Intracatheter Q12H   sodium chloride HYPERTONIC  4 mL Nebulization Daily   Continuous Infusions:  sodium chloride 10 mL/hr at 02/01/22 1626   feeding  supplement (OSMOLITE 1.5 CAL) 1,000 mL (02/01/22 1754)   meropenem (MERREM) IV 1 g (02/02/22 0516)   potassium chloride       LOS: 7 days   Darliss Cheney, MD [facility]  02/02/2022, 8:23 AM   *Please note that this is a verbal dictation therefore any spelling or grammatical errors are due to the "[place] One" system interpretation.  Please page via [place] and do not message via secure chat for urgent patient care matters. Secure chat can be used for non urgent patient care matters.  How to contact the [facility] Attending or Consulting provider [place] or covering provider during after hours [place], for this patient?  Check the care team in [facility] and look for a) attending/consulting [facility] provider listed and b) the [facility] team listed. Page or secure chat [phone]. Log into [email or web address] and use [place]'s universal password to access. If you do not have the password, please contact the hospital operator. Locate the [facility] provider you are looking for under [facility] and page to a number that you can be directly reached. If you still have difficulty reaching the provider, please page the [facility] (Director on Call) for the Hospitalists listed on amion for assistance.

## 2022-02-02 NOTE — Plan of Care (Signed)
  Problem: Nutrition: Goal: Adequate nutrition will be maintained Outcome: Progressing   Problem: Elimination: Goal: Will not experience complications related to urinary retention Outcome: Progressing   Problem: Pain Managment: Goal: General experience of comfort will improve Outcome: Progressing   Problem: Safety: Goal: Ability to remain free from injury will improve Outcome: Progressing   Problem: Skin Integrity: Goal: Risk for impaired skin integrity will decrease Outcome: Progressing   

## 2022-02-02 NOTE — Plan of Care (Signed)

## 2022-02-03 ENCOUNTER — Inpatient Hospital Stay (HOSPITAL_COMMUNITY): Payer: Medicare Other

## 2022-02-03 DIAGNOSIS — I2602 Saddle embolus of pulmonary artery with acute cor pulmonale: Secondary | ICD-10-CM | POA: Diagnosis not present

## 2022-02-03 LAB — GLUCOSE, CAPILLARY
Glucose-Capillary: 111 mg/dL — ABNORMAL HIGH (ref 70–99)
Glucose-Capillary: 117 mg/dL — ABNORMAL HIGH (ref 70–99)
Glucose-Capillary: 129 mg/dL — ABNORMAL HIGH (ref 70–99)
Glucose-Capillary: 131 mg/dL — ABNORMAL HIGH (ref 70–99)
Glucose-Capillary: 83 mg/dL (ref 70–99)
Glucose-Capillary: 94 mg/dL (ref 70–99)

## 2022-02-03 LAB — CBC
HCT: 26.8 % — ABNORMAL LOW (ref 36.0–46.0)
Hemoglobin: 8.5 g/dL — ABNORMAL LOW (ref 12.0–15.0)
MCH: 29.4 pg (ref 26.0–34.0)
MCHC: 31.7 g/dL (ref 30.0–36.0)
MCV: 92.7 fL (ref 80.0–100.0)
Platelets: 147 10*3/uL — ABNORMAL LOW (ref 150–400)
RBC: 2.89 MIL/uL — ABNORMAL LOW (ref 3.87–5.11)
RDW: 16.1 % — ABNORMAL HIGH (ref 11.5–15.5)
WBC: 10.2 10*3/uL (ref 4.0–10.5)
nRBC: 0.2 % (ref 0.0–0.2)

## 2022-02-03 LAB — BASIC METABOLIC PANEL
Anion gap: 7 (ref 5–15)
BUN: 23 mg/dL (ref 8–23)
CO2: 27 mmol/L (ref 22–32)
Calcium: 9.3 mg/dL (ref 8.9–10.3)
Chloride: 104 mmol/L (ref 98–111)
Creatinine, Ser: 0.52 mg/dL (ref 0.44–1.00)
GFR, Estimated: >60 mL/min (ref >60)
Glucose, Bld: 141 mg/dL — ABNORMAL HIGH (ref 70–99)
Potassium: 3.8 mmol/L (ref 3.5–5.1)
Sodium: 138 mmol/L (ref 135–145)

## 2022-02-03 MED ORDER — INSULIN GLARGINE-YFGN 100 UNIT/ML ~~LOC~~ SOLN
10.0000 [IU] | Freq: Every day | SUBCUTANEOUS | Status: DC
  Administered 2022-02-03 – 2022-02-14 (×11): 10 [IU] via SUBCUTANEOUS
  Filled 2022-02-03 (×14): qty 0.1

## 2022-02-03 MED ORDER — METOPROLOL TARTRATE 5 MG/5ML IV SOLN
5.0000 mg | Freq: Four times a day (QID) | INTRAVENOUS | Status: DC
  Administered 2022-02-03 – 2022-02-06 (×12): 5 mg via INTRAVENOUS
  Filled 2022-02-03 (×13): qty 5

## 2022-02-03 MED ORDER — ENOXAPARIN SODIUM 100 MG/ML IJ SOSY
1.0000 mg/kg | PREFILLED_SYRINGE | Freq: Two times a day (BID) | INTRAMUSCULAR | Status: DC
  Administered 2022-02-03 – 2022-02-05 (×4): 85 mg via SUBCUTANEOUS
  Filled 2022-02-03 (×5): qty 0.85

## 2022-02-03 MED ORDER — SODIUM CHLORIDE 0.9 % IV SOLN: INTRAVENOUS | Status: DC

## 2022-02-03 MED ORDER — METOPROLOL TARTRATE 25 MG PO TABS: 25.0000 mg | ORAL_TABLET | Freq: Two times a day (BID) | ORAL | Status: DC

## 2022-02-03 NOTE — Plan of Care (Signed)

## 2022-02-03 NOTE — Progress Notes (Signed)
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  Patient found to have a pulmonary embolism.  Given altered mental status, patient required to be intubated.  Patient had low blood pressures requiring Levophed.  Patient was started on Lovenox for PE and admitted to ICU for further work-up.  During hospital course, patient started bruising very badly, and hemoglobin started to drop after initiation of Lovenox, and anticoagulation was stopped.  Patient then had a mechanical thrombectomy once stable to retrieve clot.  Patient required 2 units of blood, and patient's hemoglobin remained stable afterwards.  Heparin was initiated, and patient's hemoglobin remains stable.  Patient was eventually weaned off ventilator back to room air.  Patient's blood pressures remained stable and no longer requiring pressors.  Patient is currently requiring antibiotics for pneumonia. Patient's platelets during hospitalization have started to drop, and HIT antibodies have been ordered.  Patient was transitioned from heparin to Eliquis after core track is placed.  PT OT recommends SNF.  Patient was transferred to [facility] on 02/02/2022.  Assessment & Plan:   Principal Problem:   Pulmonary embolism with acute cor pulmonale (HCC) Active Problems:   Brain mass   CVA (cerebral vascular accident) ([place])   Pressure injury of skin   Malnutrition of moderate degree  Acute metabolic encephalopathy, likely secondary to saddle massive pulmonary embolism and severe sepsis, resolving Patient now been extubated.  Patient is status post mechanical thrombectomy 01/28/2022.  Patient is off of all pressors and maintaining blood pressure on her own.   -Continue midodrine 10 mg every 8 hours.  She was transitioned to Eliquis.  Currently  she is oriented to only self.   Hypokalemia: Resolved.  #Acute hypoxic respiratory failure secondary to PE and Klebsiella pneumoniae: Improving.  Afebrile.  Very mild leukocytosis.  She is on room air.  Continue meropenem for 5 days.  Has history of ESBL.  Continue bronchodilators.   #Blood loss anemia: Likely secondary to anticoagulation.  Patient's hemoglobin suddenly dropped from 8.5-7.3 on 01/29/2022 and she received 2 units of PRBC transfusion.  Anticoagulation was stopped briefly.  It has been resumed ever since.  Hemoglobin has remained stable between 8 and 9 ever since.  Monitor closely.  #Thrombocytopenia: Numbers improving.  No signs of bleeding.  HIT antibodies negative.  Continue Eliquis.  #Type 2 diabetes mellitus: Currently on Semglee 15 units, blood sugar slightly elevated, will increase to 20 units and continue SSI.   #Meningioma Stable at this time.  No intervention needed at this time.  Follow-up with neurosurgery as outpatient.   #Stage I sacral decubitus ulcer and heel ulcer #Present on admission Stable -Continue wound care   #Elevated liver enzymes, resolving   Dysphagia/poor p.o. intake: Has core track and getting tube feedings.  SLP following.  DVT prophylaxis:   Eliquis   Code Status: Full Code  Family Communication:  None present at bedside.  Plan of care discussed with patient in length and he/she verbalized understanding and agreed with it.  Status is: Inpatient Remains inpatient appropriate because: Patient with dysphagia and require SNF.   Estimated body mass index is 32.8 kg/m as calculated from the following:   Height as of this encounter: '5\' 3"'$  (1.6 m).   Weight as of this encounter: 84 kg.  Pressure Injury 01/26/22 Buttocks Mid Stage 1 -  Intact skin with non-blanchable redness of a localized area usually over a bony prominence. (Active)  01/26/22 1549  Location: Buttocks  Location Orientation: Mid  Staging: Stage 1 -  Intact skin with  non-blanchable redness of a localized area usually over a bony prominence.  Wound Description (Comments):   Present on Admission: Yes  Dressing Type Foam - Lift dressing to assess site every shift 02/02/22 2100     Pressure Injury 01/26/22 Heel Left Stage 1 -  Intact skin with non-blanchable redness of a localized area usually over a bony prominence. (Active)  01/26/22 1500  Location: Heel  Location Orientation: Left  Staging: Stage 1 -  Intact skin with non-blanchable redness of a localized area usually over a bony prominence.  Wound Description (Comments):   Present on Admission: Yes  Dressing Type Foam - Lift dressing to assess site every shift 02/02/22 2100     Pressure Injury 01/28/22 Buttocks Right Deep Tissue Pressure Injury - Purple or maroon localized area of discolored intact skin or blood-filled blister due to damage of underlying soft tissue from pressure and/or shear. (Active)  01/28/22 1642  Location: Buttocks  Location Orientation: Right  Staging: Deep Tissue Pressure Injury - Purple or maroon localized area of discolored intact skin or blood-filled blister due to damage of underlying soft tissue from pressure and/or shear.  Wound Description (Comments):   Present on Admission:   Dressing Type Foam - Lift dressing to assess site every shift 02/02/22 2100   Nutritional Assessment: Body mass index is 32.8 kg/m.Marland Kitchen Seen by dietician.  I agree with the assessment and plan as outlined below: Nutrition Status: Nutrition Problem: Moderate Malnutrition Etiology: chronic illness (meningioma) Signs/Symptoms: mild fat depletion, moderate muscle depletion Interventions: Tube feeding  . Skin Assessment: I have examined the patient's skin and I agree with the wound assessment as performed by the wound care RN as outlined below: Pressure Injury 01/26/22 Buttocks Mid Stage 1 -  Intact skin with non-blanchable redness of a localized area usually over a bony prominence. (Active)   01/26/22 1549  Location: Buttocks  Location Orientation: Mid  Staging: Stage 1 -  Intact skin with non-blanchable redness of a localized area usually over a bony prominence.  Wound Description (Comments):   Present on Admission: Yes  Dressing Type Foam - Lift dressing to assess site every shift 02/02/22 2100     Pressure Injury 01/26/22 Heel Left Stage 1 -  Intact skin with non-blanchable redness of a localized area usually over a bony prominence. (Active)  01/26/22 1500  Location: Heel  Location Orientation: Left  Staging: Stage 1 -  Intact skin with non-blanchable redness of a localized area usually over a bony prominence.  Wound Description (Comments):   Present on Admission: Yes  Dressing Type Foam - Lift dressing to assess site every shift 02/02/22 2100     Pressure Injury 01/28/22 Buttocks Right Deep Tissue Pressure Injury - Purple or maroon localized area of discolored intact skin or blood-filled blister due to damage of underlying soft tissue from pressure and/or shear. (Active)  01/28/22 1642  Location: Buttocks  Location Orientation: Right  Staging: Deep Tissue Pressure Injury - Purple or maroon localized area of discolored intact skin or blood-filled blister due to damage of underlying soft tissue from pressure and/or shear.  Wound Description (Comments):   Present on Admission:   Dressing Type Foam - Lift dressing to assess site every shift 02/02/22 2100    Consultants:  IR  Procedures:  As above  Antimicrobials:  Anti-infectives (From admission, onward)    Start     Dose/Rate Route Frequency Ordered Stop   01/31/22 2100  meropenem (MERREM) 1 g in sodium chloride 0.9 % 100 mL IVPB        1 g 200 mL/hr over 30 Minutes Intravenous Every 8 hours 01/31/22 1656 02/07/22 2359   01/31/22 1445  ceFEPIme (MAXIPIME) 2 g in sodium chloride 0.9 % 100 mL IVPB  Status:  Discontinued        2 g 200 mL/hr over 30 Minutes Intravenous Every 12 hours 01/31/22 1359 01/31/22 1656    01/27/22 2200  ceFEPIme (MAXIPIME) 2 g in sodium chloride 0.9 % 100 mL IVPB  Status:  Discontinued        2 g 200 mL/hr over 30 Minutes Intravenous Every 12 hours 01/27/22 1410 01/29/22 1200   01/26/22 2000  ceFEPIme (MAXIPIME) 2 g in sodium chloride 0.9 % 100 mL IVPB  Status:  Discontinued        2 g 200 mL/hr over 30 Minutes Intravenous Every 8 hours 01/26/22 1316 01/27/22 1410   01/26/22 1030  ceFEPIme (MAXIPIME) 2 g in sodium chloride 0.9 % 100 mL IVPB        2 g 200 mL/hr over 30 Minutes Intravenous  Once 01/26/22 1015 01/26/22 1317   01/26/22 1030  vancomycin (VANCOREADY) IVPB 2000 mg/400 mL  Status:  Discontinued        2,000 mg 200 mL/hr over 120 Minutes Intravenous  Once 01/26/22 1015 01/26/22 1629         Subjective:  Patient seen and examined.  She is alert and oriented to self and place.  No complaints.  Objective: Vitals:   02/02/22 2055 02/03/22 0100 02/03/22 0110 02/03/22 0839  BP: 136/87  (!) 146/76 (!) 157/61  Pulse: 60 (!) 113 (!) 105 88  Resp: (!) 21 (!) 21 19 (!) 22  Temp: 98.3 F (36.8 C)   98.1 F (36.7 C)  TempSrc: Oral   Axillary  SpO2: 100% 100% 100% 100%  Weight:      Height:        Intake/Output Summary (Last 24 hours) at 02/03/2022 1021 Last data filed at 02/02/2022 2301 Gross per 24 hour  Intake 1479.17 ml  Output 850 ml  Net 629.17 ml    Filed Weights   01/29/22 0453 01/31/22 0500 02/01/22 0500  Weight: 84 kg 84 kg 84 kg    Examination:  General exam: Appears calm and comfortable  Respiratory system: Clear to auscultation. Respiratory effort normal. Cardiovascular system: S1 & S2 heard, RRR. No JVD, murmurs, rubs, gallops or clicks. No pedal edema. Gastrointestinal system: Abdomen is nondistended, soft and nontender. No organomegaly or masses felt. Normal bowel sounds heard. Central nervous system: Alert and oriented x2. No focal neurological deficits. Extremities: Symmetric 5 x 5 power but she is globally very weak. Skin: No  rashes, lesions or ulcers.   Data Reviewed: I have personally reviewed following labs and imaging studies  CBC: Recent Labs  Lab 01/30/22 0432 01/31/22 0326 02/01/22 0458 02/02/22 0307 02/03/22 0249  WBC 11.6* 11.0* 10.4 11.5* 10.2  HGB 8.5* 8.5* 8.8* 8.6* 8.5*  HCT 24.3* 25.5* 26.6* 27.2* 26.8*  MCV 85.3 88.5 90.5 93.8 92.7  PLT 122* 108* 106* 141* 147*    Basic Metabolic Panel: Recent Labs  Lab 01/28/22 0208 01/28/22 1325 01/30/22 0432 01/31/22 0326 02/01/22 0458 02/02/22 0307 02/03/22 0249  NA 133*   < > 134* 136 134* 136  138  K 4.3   < > 3.7 4.7 3.8 3.4* 3.8  CL 104   < > 104 101 99 100 104  CO2 21*   < > '24 26 26 28 27  '$ GLUCOSE 193*   < > 243* 185* 118* 265* 141*  BUN 24*   < > 35* 31* 24* 29* 23  CREATININE 0.81   < > 0.95 0.62 0.65 0.61 0.52  CALCIUM 9.4   < > 8.1* 8.3* 8.5* 9.2 9.3  MG 1.9  --   --   --   --   --   --   PHOS 2.4*  --   --   --   --   --   --    < > = values in this interval not displayed.    GFR: Estimated Creatinine Clearance: 55.6 mL/min (by C-G formula based on SCr of 0.52 mg/dL). Liver Function Tests: Recent Labs  Lab 01/30/22 0432 01/31/22 0326 02/01/22 0458 02/02/22 0307  AST 75* 37 30 25  ALT 160* 113* 82* 64*  ALKPHOS 63 62 53 59  BILITOT 0.6 0.6 0.9 0.6  PROT 4.3* 4.5* 4.9* 5.0*  ALBUMIN 2.1* 2.2* 2.3* 2.2*    No results for input(s): "LIPASE", "AMYLASE" in the last 168 hours. No results for input(s): "AMMONIA" in the last 168 hours. Coagulation Profile: No results for input(s): "INR", "PROTIME" in the last 168 hours. Cardiac Enzymes: No results for input(s): "CKTOTAL", "CKMB", "CKMBINDEX", "TROPONINI" in the last 168 hours. BNP (last 3 results) No results for input(s): "PROBNP" in the last 8760 hours. HbA1C: No results for input(s): "HGBA1C" in the last 72 hours. CBG: Recent Labs  Lab 02/02/22 1629 02/02/22 1929 02/03/22 0011 02/03/22 0414 02/03/22 0900  GLUCAP 247* 280* 131* 117* 83    Lipid  Profile: No results for input(s): "CHOL", "HDL", "LDLCALC", "TRIG", "CHOLHDL", "LDLDIRECT" in the last 72 hours. Thyroid Function Tests: No results for input(s): "TSH", "T4TOTAL", "FREET4", "T3FREE", "THYROIDAB" in the last 72 hours. Anemia Panel: No results for input(s): "VITAMINB12", "FOLATE", "FERRITIN", "TIBC", "IRON", "RETICCTPCT" in the last 72 hours. Sepsis Labs: Recent Labs  Lab 01/30/22 1115  PROCALCITON 0.31     Recent Results (from the past 240 hour(s))  Resp Panel by RT-PCR (Flu A&B, Covid) Anterior Nasal Swab     Status: None   Collection Time: 01/26/22  9:10 AM   Specimen: Anterior Nasal Swab  Result Value Ref Range Status   SARS Coronavirus 2 by RT PCR NEGATIVE NEGATIVE Final    Comment: (NOTE) SARS-CoV-2 target nucleic acids are NOT DETECTED.  The SARS-CoV-2 RNA is generally detectable in upper respiratory specimens during the acute phase of infection. The lowest concentration of SARS-CoV-2 viral copies this assay can detect is 138 copies/mL. A negative result does not preclude SARS-Cov-2 infection and should not be used as the sole basis for treatment or other patient management decisions. A negative result may occur with  improper specimen collection/handling, submission of specimen other than nasopharyngeal swab, presence of viral mutation(s) within the areas targeted by this assay, and inadequate number of viral copies(<138 copies/mL). A negative result must be combined with clinical observations, patient history, and epidemiological information. The expected result is Negative.  Fact Sheet for Patients:  EntrepreneurPulse.com.au  Fact Sheet for Healthcare Providers:  IncredibleEmployment.be  This test is no t yet approved or cleared by the [place] FDA and  has been authorized for detection and/or diagnosis of SARS-CoV-2 by FDA under an Emergency Use Authorization (  EUA). This EUA will remain  in effect (meaning  this test can be used) for the duration of the COVID-19 declaration under Section 564(b)(1) of the Act, 21 U.S.C.section 360bbb-3(b)(1), unless the authorization is terminated  or revoked sooner.       Influenza A by PCR NEGATIVE NEGATIVE Final   Influenza B by PCR NEGATIVE NEGATIVE Final    Comment: (NOTE) The Xpert Xpress SARS-CoV-2/FLU/RSV plus assay is intended as an aid in the diagnosis of influenza from Nasopharyngeal swab specimens and should not be used as a sole basis for treatment. Nasal washings and aspirates are unacceptable for Xpert Xpress SARS-CoV-2/FLU/RSV testing.  Fact Sheet for Patients: EntrepreneurPulse.com.au  Fact Sheet for Healthcare Providers: IncredibleEmployment.be  This test is not yet approved or cleared by the [place] FDA and has been authorized for detection and/or diagnosis of SARS-CoV-2 by FDA under an Emergency Use Authorization (EUA). This EUA will remain in effect (meaning this test can be used) for the duration of the COVID-19 declaration under Section 564(b)(1) of the Act, 21 U.S.C. section 360bbb-3(b)(1), unless the authorization is terminated or revoked.  Performed at [facility], [place] [address], [place], [place] [address]   Culture, blood (routine x 2)     Status: None   Collection Time: 01/26/22 11:38 AM   Specimen: BLOOD  Result Value Ref Range Status   Specimen Description BLOOD  Final   Special Requests   Final    BOTTLES DRAWN AEROBIC AND ANAEROBIC Blood Culture adequate volume   Culture   Final    NO GROWTH 5 DAYS Performed at [facility], [place] [address] [place], [place] [address]    Report Status 01/31/2022 FINAL  Final  MRSA Next Gen by PCR, Nasal     Status: None   Collection Time: 01/26/22  2:36 PM   Specimen: Nasal Mucosa; Nasal Swab  Result Value Ref Range Status   MRSA by PCR Next Gen NOT DETECTED NOT DETECTED Final    Comment: (NOTE) The GeneXpert MRSA  Assay (FDA approved for NASAL specimens only), is one component of a comprehensive MRSA colonization surveillance program. It is not intended to diagnose MRSA infection nor to guide or monitor treatment for MRSA infections. Test performance is not FDA approved in patients less than 64 years old. Performed at [facility], [place] [address], [place], [place] [address]   Urine Culture     Status: None   Collection Time: 01/26/22  2:52 PM   Specimen: Urine, Catheterized  Result Value Ref Range Status   Specimen Description URINE, CATHETERIZED  Final   Special Requests NONE  Final   Culture   Final    NO GROWTH Performed at [facility], [address] [place], [place] [address]    Report Status 01/27/2022 FINAL  Final  Expectorated Sputum Assessment w Gram Stain, Rflx to Resp Cult     Status: None   Collection Time: 01/30/22  9:49 AM   Specimen: Expectorated Sputum  Result Value Ref Range Status   Specimen Description EXPECTORATED SPUTUM  Final   Special Requests NONE  Final   Sputum evaluation   Final    THIS SPECIMEN IS ACCEPTABLE FOR SPUTUM CULTURE Performed at [facility], [place] [address], [place], [place] [address]    Report Status 02/01/2022 FINAL  Final  Culture, Respiratory w Gram Stain     Status: None   Collection Time: 01/30/22  9:49 AM  Result Value  Ref Range Status   Specimen Description EXPECTORATED SPUTUM  Final   Special Requests NONE Reflexed from [id number]  Final   Gram Stain   Final    MODERATE WBC PRESENT,BOTH PMN AND MONONUCLEAR FEW GRAM POSITIVE COCCI IN CLUSTERS FEW GRAM NEGATIVE RODS FEW YEAST WITH PSEUDOHYPHAE Performed at [facility], [place] [address], [place], [place] [address]    Culture   Final    MODERATE KLEBSIELLA PNEUMONIAE Confirmed Extended Spectrum Beta-Lactamase Producer (ESBL).  In bloodstream infections from ESBL organisms, carbapenems are preferred over piperacillin/tazobactam. They are shown to have a lower risk of  mortality.    Report Status 02/01/2022 FINAL  Final   Organism ID, Bacteria KLEBSIELLA PNEUMONIAE  Final      Susceptibility   Klebsiella pneumoniae - MIC*    AMPICILLIN >=32 RESISTANT Resistant     CEFAZOLIN >=64 RESISTANT Resistant     CEFEPIME >=32 RESISTANT Resistant     CEFTAZIDIME RESISTANT Resistant     CEFTRIAXONE >=64 RESISTANT Resistant     CIPROFLOXACIN <=0.25 SENSITIVE Sensitive     GENTAMICIN <=1 SENSITIVE Sensitive     IMIPENEM <=0.25 SENSITIVE Sensitive     TRIMETH/SULFA >=320 RESISTANT Resistant     AMPICILLIN/SULBACTAM >=32 RESISTANT Resistant     PIP/TAZO 16 SENSITIVE Sensitive     * MODERATE KLEBSIELLA PNEUMONIAE     Radiology Studies: DG Abd Portable 1V  Result Date: 02/01/2022 CLINICAL DATA:  Feeding tube placement EXAM: PORTABLE ABDOMEN - 1 VIEW COMPARISON:  CT abdomen and pelvis 01/30/2019 FINDINGS: The bowel gas pattern is normal. Weighted enteric tube terminates in the Peri pyloric region. Gas distended stomach. Degenerative changes in the visualized lumbar spine. IMPRESSION: Weighted enteric tube terminates in the peripyloric region. Electronically Signed   By: Marin Roberts M.D.   On: 02/01/2022 15:54    Scheduled Meds:  apixaban  10 mg Per Tube BID   Followed by   Derrill Memo ON 02/08/2022] apixaban  5 mg Per Tube BID   Chlorhexidine Gluconate Cloth  6 each Topical Q0600   docusate  100 mg Per Tube BID   feeding supplement (PROSource TF20)  60 mL Per Tube Daily   furosemide  40 mg Intravenous Daily   insulin aspart  0-15 Units Subcutaneous Q4H   insulin aspart  0-5 Units Subcutaneous QHS   insulin glargine-yfgn  20 Units Subcutaneous QHS   metoprolol tartrate  25 mg Oral BID   mouth rinse  15 mL Mouth Rinse 4 times per day   pantoprazole  40 mg Per Tube Daily   polyethylene glycol  17 g Per Tube BID   rosuvastatin  20 mg Per Tube Daily   senna  2 tablet Per Tube Daily   sodium chloride flush  10-40 mL Intracatheter Q12H   Continuous Infusions:   sodium chloride 10 mL/hr at 02/01/22 1626   sodium chloride 50 mL/hr at 02/02/22 2258   feeding supplement (OSMOLITE 1.5 CAL) 1,000 mL (02/02/22 1517)   meropenem (MERREM) IV 1 g (02/03/22 0532)     LOS: 8 days   Darliss Cheney, MD [facility]  02/03/2022, 10:21 AM   *Please note that this is a verbal dictation therefore any spelling or grammatical errors are due to the "[employer]" system interpretation.  Please page via [place] and do not message via secure chat for urgent patient care matters. Secure chat can be used for non urgent patient care matters.  How to contact the [facility] Attending or Consulting provider 7A -  7P or covering provider during after hours [place], for this patient?  Check the care team in [facility] and look for a) attending/consulting TRH provider listed and b) the [facility] team listed. Page or secure chat 7A-7P. Log into [email or web address] and use [facility] universal password to access. If you do not have the password, please contact the hospital operator. Locate the [facility] provider you are looking for under [facility] and page to a number that you can be directly reached. If you still have difficulty reaching the provider, please page the [facility] (Director on Call) for the Hospitalists listed on amion for assistance.

## 2022-02-03 NOTE — Progress Notes (Signed)
ANTICOAGULATION CONSULT NOTE - Follow Up Consult  Pharmacy Consult for Enoxaparin Indication: pulmonary embolus  Allergies  Allergen Reactions   Anesthetics, Amide Other (See Comments)    Pt is intolerant to general anesthesia. Pt will throw up and has thrown up during procedure.    Ether Nausea And Vomiting    Patient Measurements: Height: '5\' 3"'$  (160 cm) Weight: 84 kg (185 lb 3 oz) IBW/kg (Calculated) : 52.4  Vital Signs: Temp: 98.1 F (36.7 C) (10/15 1137) Temp Source: Axillary (10/15 1137) BP: 112/75 (10/15 1137) Pulse Rate: 104 (10/15 1137)  Labs: Recent Labs    02/01/22 0458 02/02/22 0307 02/03/22 0249  HGB 8.8* 8.6* 8.5*  HCT 26.6* 27.2* 26.8*  PLT 106* 141* 147*  HEPARINUNFRC 0.30  --   --   CREATININE 0.65 0.61 0.52    Estimated Creatinine Clearance: 55.6 mL/min (by C-G formula based on SCr of 0.52 mg/dL).   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 Patient was started on enoxaparin but had bleeding at CVC site s/p thrombi pad x3. No anticoagulation prior to admission.   Patient was transition from heparin to apixaban but patient removed cortrak last night (apixaban not given) and NGT unable to be placed today. Pharmacy consulted for enoxaparin.   Goal of Therapy:  Anti-Xa level 0.6-1 units/ml 4hrs after LMWH dose given Monitor platelets by anticoagulation protocol: Yes   Plan:  Start enoxaparin 85 mg (1 mg/kg) SQ q12h  Follow up ability to give enteral medications again for resuming apixaban   Cristela Felt, PharmD, BCPS Clinical Pharmacist 02/03/2022 1:09 PM

## 2022-02-03 NOTE — Plan of Care (Signed)
  Problem: Coping: Goal: Level of anxiety will decrease Outcome: Progressing   Problem: Pain Managment: Goal: General experience of comfort will improve Outcome: Progressing   

## 2022-02-03 NOTE — Progress Notes (Signed)
Unsuccessful attempt to place NG tube.

## 2022-02-03 NOTE — Progress Notes (Signed)
Another unsuccessful attempt to place NG tube. CN tried to use smaller size this time. Pt is unable to follow commands. MD is aware.

## 2022-02-03 NOTE — Progress Notes (Signed)
Patient pulled her cortrak out. NP Kathryne Eriksson informed. Held her medicines to be given per tube, got an order for IVF. Will continue to monitor the patient.

## 2022-02-04 ENCOUNTER — Inpatient Hospital Stay (HOSPITAL_COMMUNITY): Payer: Medicare Other

## 2022-02-04 ENCOUNTER — Telehealth (HOSPITAL_COMMUNITY): Payer: Self-pay | Admitting: Pharmacy Technician

## 2022-02-04 ENCOUNTER — Other Ambulatory Visit (HOSPITAL_COMMUNITY): Payer: Self-pay

## 2022-02-04 DIAGNOSIS — I2602 Saddle embolus of pulmonary artery with acute cor pulmonale: Secondary | ICD-10-CM | POA: Diagnosis not present

## 2022-02-04 LAB — CBC
HCT: 26 % — ABNORMAL LOW (ref 36.0–46.0)
Hemoglobin: 8.1 g/dL — ABNORMAL LOW (ref 12.0–15.0)
MCH: 29.6 pg (ref 26.0–34.0)
MCHC: 31.2 g/dL (ref 30.0–36.0)
MCV: 94.9 fL (ref 80.0–100.0)
Platelets: 141 10*3/uL — ABNORMAL LOW (ref 150–400)
RBC: 2.74 MIL/uL — ABNORMAL LOW (ref 3.87–5.11)
RDW: 16.5 % — ABNORMAL HIGH (ref 11.5–15.5)
WBC: 8.1 10*3/uL (ref 4.0–10.5)
nRBC: 0 % (ref 0.0–0.2)

## 2022-02-04 LAB — GLUCOSE, CAPILLARY
Glucose-Capillary: 118 mg/dL — ABNORMAL HIGH (ref 70–99)
Glucose-Capillary: 132 mg/dL — ABNORMAL HIGH (ref 70–99)
Glucose-Capillary: 149 mg/dL — ABNORMAL HIGH (ref 70–99)
Glucose-Capillary: 84 mg/dL (ref 70–99)

## 2022-02-04 NOTE — Telephone Encounter (Signed)
Pharmacy Patient Advocate Encounter  Insurance verification completed.    The patient is insured through [employer] Medicare Part D   The patient is currently admitted and ran test claims for the following: Eliquis.  Copays and coinsurance results were relayed to Inpatient clinical team.  

## 2022-02-04 NOTE — Consult Note (Signed)
Consultation Note Date:    Patient Name: Cassandra Dodson  DOB: Jan 19, 1941  MRN: [id number]  Age / Sex: 81 y.o., female  PCP: Creola Corn, MD Referring Physician: Hughie Closs, MD  Reason for Consultation: Establishing goals of care  HPI/Patient Profile: 81 y.o. female  with past medical history of meningioma, multiple strokes, diabetes, recent admission wityh AMS and UTI admitted on 01/26/2022 with sudden change in mental status, hypoxia, tachypnea with saddle PE requiring intubation with severe sepsis UTI.   Clinical Assessment and Goals of Care: I met today after reviewing records and notes including previous palliative consultation. Son, Theresia Lo, is present at [name]'s bedside. [name] is awake but hard of hearing and confused and does not participate in goals of care conversation. She does answer questions when prompted - often looking to her son for the answer though. She denies pain or discomfort. She seems to be tolerating dysphagia 1, honey thick diet well with overall good intake so far. Noted significant LUE edema and RN elevated on blanket. Theresia Lo voices concern regarding swelling while noting this has been swollen throughout hospitalization - he wants to ensure there is no infection or blood clot in arm - will communicate with attending.   We discussed [name] and reviewed her worsening health complications and illnesses over the past months. She has had significant physical decline losing her ability to walk around but son has been able to support her at home with caregivers during day and caring for her himself at night. He reports that Ms. Aleo typically has one good day with therapy and the next day will be tired. He looks forward to proceeding with SNF rehab BUT is anxious about her leaving the hospital. He is clear on his desire and wishes to continue to support and optimize her to get her to a  better state of health before trying to discharge her. He reports concern that she discharged too soon and had to return to the hospital with bladder infection. Theresia Lo has struggled with the idea of not taking her home but he knows she needs 24/7 nursing care where she can have quick response with any health changes. It would be very difficult for him if something were to happen while she was in his care.   Theresia Lo is clear that he desires all measures to continue to keep his mother alive at this stage. He states clearly that he is not ready to lose her. He feels she still has some time and life left. He is motivated by her ability to progress this far after such a critical illness and sees this as a sign that she still has fight left. He is extremely support of his mother and takes his role as her voice and advocate to heart. He states that she enjoys playing cards (he has cards at the bedside he plays with her - poker), he reads to her, and she enjoys jeopardy. He does all he can to optimize her quality of life throughout her health decline.  All questions/concerns addressed. Emotional support provided.   Primary Decision Maker NEXT OF KIN son Theresia Lo    SUMMARY OF RECOMMENDATIONS   - Full code, full scope - Hopeful for further improvement while hospitalized (she is only at ~65% of her baseline) - Would like to proceed with [facility] SNF at time of discharge  Code Status/Advance Care Planning: Full code   Symptom Management:  Per attending  Prognosis:  Overall prognosis guarded with multiple significant health events over the past months.   Discharge Planning: SNF rehab      Primary Diagnoses: Present on Admission:  Pulmonary embolism with acute cor pulmonale (HCC)  CVA (cerebral vascular accident) (HCC)  Pressure injury of skin   I have reviewed the medical record, interviewed the patient and family, and examined the patient. The following aspects are pertinent.  Past Medical  History:  Diagnosis Date   Arthritis    CKD (chronic kidney disease), stage III (HCC)    patient denies   Depression    Diabetes mellitus without complication (HCC)    Difficult intravenous access    Endometrial cancer (HCC)    GERD (gastroesophageal reflux disease)    Headache    History of radiation therapy 11/04/2019-12/01/2019   Endometrial HDR; Dr. Antony Blackbird   Hypertension    Hypothyroidism    Neuromuscular disorder (HCC)    neuropathy in feet   Osteoarthritis    PMB (postmenopausal bleeding)    PONV (postoperative nausea and vomiting)    severe nausea and vomiting after knee replacement 06-2014, did ok with 2018 knee replacement   Urinary frequency    Wears glasses    Social History   Socioeconomic History   Marital status: Widowed    Spouse name: Not on file   Number of children: Not on file   Years of education: Not on file   Highest education level: Not on file  Occupational History   Not on file  Tobacco Use   Smoking status: Former    Packs/day: 1.50    Years: 45.00    Total pack years: 67.50    Types: Cigarettes    Quit date: 04/22/2002    Years since quitting: 19.8   Smokeless tobacco: Never  Vaping Use   Vaping Use: Never used  Substance and Sexual Activity   Alcohol use: Yes    Comment: rarely   Drug use: No   Sexual activity: Not Currently  Other Topics Concern   Not on file  Social History Narrative   Not on file   Social Determinants of Health   Financial Resource Strain: Not on file  Food Insecurity: Not on file  Transportation Needs: Not on file  Physical Activity: Not on file  Stress: Not on file  Social Connections: Not on file   Family History  Problem Relation Age of Onset   Pancreatic cancer Mother    Stroke Father    Hypertension Father    Colon cancer Neg Hx    Breast cancer Neg Hx    Ovarian cancer Neg Hx    Endometrial cancer Neg Hx    Prostate cancer Neg Hx    Scheduled Meds:  Chlorhexidine Gluconate Cloth  6  each Topical Q0600   docusate  100 mg Per Tube BID   enoxaparin (LOVENOX) injection  1 mg/kg Subcutaneous Q12H   feeding supplement (PROSource TF20)  60 mL Per Tube Daily   furosemide  40 mg Intravenous Daily   insulin aspart  0-15 Units Subcutaneous Q4H  insulin aspart  0-5 Units Subcutaneous QHS   insulin glargine-yfgn  10 Units Subcutaneous QHS   metoprolol tartrate  5 mg Intravenous Q6H   mouth rinse  15 mL Mouth Rinse 4 times per day   pantoprazole  40 mg Per Tube Daily   polyethylene glycol  17 g Per Tube BID   rosuvastatin  20 mg Per Tube Daily   senna  2 tablet Per Tube Daily   sodium chloride flush  10-40 mL Intracatheter Q12H   Continuous Infusions:  sodium chloride 10 mL/hr at 02/01/22 1626   sodium chloride 50 mL/hr at 02/03/22 1327   feeding supplement (OSMOLITE 1.5 CAL) 1,000 mL (02/02/22 1517)   meropenem (MERREM) IV 1 g (02/04/22 1433)   PRN Meds:.sodium chloride, acetaminophen, albuterol, ondansetron (ZOFRAN) IV, mouth rinse, sodium chloride flush Allergies  Allergen Reactions   Anesthetics, Amide Other (See Comments)    Pt is intolerant to general anesthesia. Pt will throw up and has thrown up during procedure.    Ether Nausea And Vomiting   Review of Systems  Unable to perform ROS: Acuity of condition    Physical Exam Vitals and nursing note reviewed.  Constitutional:      General: She is not in acute distress.    Appearance: She is ill-appearing.  Cardiovascular:     Rate and Rhythm: Bradycardia present.  Pulmonary:     Effort: No tachypnea, accessory muscle usage or respiratory distress.  Abdominal:     Palpations: Abdomen is soft.  Skin:    Comments: LUE severe edema without redness and not hot to the touch  Neurological:     Mental Status: She is alert. She is confused.     Vital Signs: BP (!) 158/107   Pulse (!) 53   Temp 97.8 F (36.6 C) (Oral)   Resp 18   Ht 5\' 3"  (1.6 m)   Wt 84 kg   SpO2 100%   BMI 32.80 kg/m  Pain Scale:  PAINAD POSS *See Group Information*: S-Acceptable,Sleep, easy to arouse Pain Score: 0-No pain   SpO2: SpO2: 100 % O2 Device:SpO2: 100 % O2 Flow Rate: .O2 Flow Rate (L/min): 2 L/min  IO: Intake/output summary:  Intake/Output Summary (Last 24 hours) at 02/04/2022 1502 Last data filed at 02/04/2022 1433 Gross per 24 hour  Intake 152.9 ml  Output 1650 ml  Net -1497.1 ml    LBM: Last BM Date : 02/03/22 Baseline Weight: Weight: 104.1 kg Most recent weight: Weight: 84 kg     Palliative Assessment/Data:     Time In: 0945  Time Total: 60 min  Greater than 50%  of this time was spent counseling and coordinating care related to the above assessment and plan.  Signed by: Yong Channel, NP Palliative Medicine Team Pager # [phone] (M-F 8a-5p) Team Phone # [phone] (Nights/Weekends)

## 2022-02-04 NOTE — TOC Benefit Eligibility Note (Signed)
Patient Teacher, English as a foreign language completed.    The patient is currently admitted and upon discharge could be taking Eliquis Starter Pack.  The current 30 day co-pay is $47.00.   The patient is insured through [place], [place] Patient Advocate Specialist [place] Patient Advocate Team Direct Number: [phone]  Fax: [phone]

## 2022-02-04 NOTE — Progress Notes (Signed)
PROGRESS NOTE    EMILIJA BOHMAN  ZCH:[id number] DOB: 07/06/1940 DOA: 01/26/2022 PCP: Shon Baton, MD   Brief Narrative:  81 year old female with past medical history of meningioma and multiple CVAs most recently late 12/2021 admitted for altered mental status.  Patient found to have a pulmonary embolism.  Given altered mental status, patient required to be intubated.  Patient had low blood pressures requiring Levophed.  Patient was started on Lovenox for PE and admitted to ICU for further work-up.  During hospital course, patient started bruising very badly, and hemoglobin started to drop after initiation of Lovenox, and anticoagulation was stopped.  Patient then had a mechanical thrombectomy once stable to retrieve clot.  Patient required 2 units of blood, and patient's hemoglobin remained stable afterwards.  Heparin was initiated, and patient's hemoglobin remains stable.  Patient was eventually weaned off ventilator back to room air.  Patient's blood pressures remained stable and no longer requiring pressors.  Patient is currently requiring antibiotics for pneumonia. Patient's platelets during hospitalization have started to drop, and HIT antibodies have been ordered.  Patient was transitioned from heparin to Eliquis after core track is placed.  PT OT recommends SNF.  Patient was transferred to [facility] on 02/02/2022.  Assessment & Plan:   Principal Problem:   Pulmonary embolism with acute cor pulmonale (HCC) Active Problems:   Brain mass   CVA (cerebral vascular accident) ([place])   Pressure injury of skin   Malnutrition of moderate degree  Acute metabolic encephalopathy, likely secondary to saddle massive pulmonary embolism and severe sepsis, resolving Patient now been extubated.  Patient is status post mechanical thrombectomy 01/28/2022.  Patient is off of all pressors and maintaining blood pressure on her own.   -Continue midodrine 10 mg every 8 hours.  She was transitioned to Eliquis.  Currently  she is oriented to self and place.   Hypokalemia: Resolved.  #Acute hypoxic respiratory failure secondary to PE and Klebsiella pneumoniae: Improving.  Afebrile.  Very mild leukocytosis.  She is on room air.  Continue meropenem for 5 days.  Has history of ESBL.  Continue bronchodilators.   #Blood loss anemia: Likely secondary to anticoagulation.  Patient's hemoglobin suddenly dropped from 8.5-7.3 on 01/29/2022 and she received 2 units of PRBC transfusion.  Anticoagulation was stopped briefly.  It has been resumed ever since.  Hemoglobin has remained stable between 8 and 9 ever since.  Monitor closely.  #Thrombocytopenia: Stable.  No signs of bleeding.  HIT antibodies negative.  Continue Eliquis.  #Type 2 diabetes mellitus: Her Semglee was decreased to 10 units yesterday since she lost/pulled her Coreg and was not able to get the feedings and she was NPO.  Her blood sugar is fairly stable.  Continue current dose as well as SSI.   #Meningioma Stable at this time.  No intervention needed at this time.  Follow-up with neurosurgery as outpatient.   #Stage I sacral decubitus ulcer and heel ulcer #Present on admission Stable -Continue wound care   #Elevated liver enzymes, resolving   Dysphagia/poor p.o. intake: She pulled her cortrak yesterday.  The nurse was unsuccessful to place NG tube.  The nurse today is trying to get a hold of the team for the core track to be replaced.  She was seen by speech and they have started her on dysphagia 1 diet.  Per them, she is likely going to require PEG tube.  I had a very lengthy discussion with patient's son and he wants to wait for 2 to 3 days  to see if his mother is going to eat by herself enough to avoid PEG tube.  He believes that she is improving.   Disposition: Discussed with the son in length that as long as she has cortrak in place, she will not be able to discharge to SNF.  He was very adamant that he would like to keep his mother in the hospital and  not let her go to SNF until she is able to eat by herself and get some therapy.  He said that " me and my mother are fighters, I am ready to fight with whoever I have to, anybody in the hospital or the insurance company to keep my mother in the hospital as long as she needs to because I am afraid that if she were to go home, she will develop other problems and might not make it and I want her to be safe and I want her tumor to be removed from her surgeon after she recovers from this as well" I have consulted palliative care to help.  DVT prophylaxis:   Eliquis   Code Status: Full Code  Family Communication:  None present at bedside.  Plan of care discussed with the son over the phone.  Status is: Inpatient Remains inpatient appropriate because: Patient with dysphagia and require SNF.   Estimated body mass index is 32.8 kg/m as calculated from the following:   Height as of this encounter: '5\' 3"'$  (1.6 m).   Weight as of this encounter: 84 kg.  Pressure Injury 01/26/22 Buttocks Mid Stage 1 -  Intact skin with non-blanchable redness of a localized area usually over a bony prominence. (Active)  01/26/22 1549  Location: Buttocks  Location Orientation: Mid  Staging: Stage 1 -  Intact skin with non-blanchable redness of a localized area usually over a bony prominence.  Wound Description (Comments):   Present on Admission: Yes  Dressing Type Foam - Lift dressing to assess site every shift 02/03/22 2120     Pressure Injury 01/26/22 Heel Left Stage 1 -  Intact skin with non-blanchable redness of a localized area usually over a bony prominence. (Active)  01/26/22 1500  Location: Heel  Location Orientation: Left  Staging: Stage 1 -  Intact skin with non-blanchable redness of a localized area usually over a bony prominence.  Wound Description (Comments):   Present on Admission: Yes  Dressing Type Foam - Lift dressing to assess site every shift 02/03/22 2120     Pressure Injury 01/28/22 Buttocks  Right Deep Tissue Pressure Injury - Purple or maroon localized area of discolored intact skin or blood-filled blister due to damage of underlying soft tissue from pressure and/or shear. (Active)  01/28/22 1642  Location: Buttocks  Location Orientation: Right  Staging: Deep Tissue Pressure Injury - Purple or maroon localized area of discolored intact skin or blood-filled blister due to damage of underlying soft tissue from pressure and/or shear.  Wound Description (Comments):   Present on Admission:   Dressing Type Foam - Lift dressing to assess site every shift 02/03/22 2120   Nutritional Assessment: Body mass index is 32.8 kg/m.Marland Kitchen Seen by dietician.  I agree with the assessment and plan as outlined below: Nutrition Status: Nutrition Problem: Moderate Malnutrition Etiology: chronic illness (meningioma) Signs/Symptoms: mild fat depletion, moderate muscle depletion Interventions: Tube feeding  . Skin Assessment: I have examined the patient's skin and I agree with the wound assessment as performed by the wound care RN as outlined below: Pressure Injury 01/26/22 Buttocks Mid  Stage 1 -  Intact skin with non-blanchable redness of a localized area usually over a bony prominence. (Active)  01/26/22 1549  Location: Buttocks  Location Orientation: Mid  Staging: Stage 1 -  Intact skin with non-blanchable redness of a localized area usually over a bony prominence.  Wound Description (Comments):   Present on Admission: Yes  Dressing Type Foam - Lift dressing to assess site every shift 02/03/22 2120     Pressure Injury 01/26/22 Heel Left Stage 1 -  Intact skin with non-blanchable redness of a localized area usually over a bony prominence. (Active)  01/26/22 1500  Location: Heel  Location Orientation: Left  Staging: Stage 1 -  Intact skin with non-blanchable redness of a localized area usually over a bony prominence.  Wound Description (Comments):   Present on Admission: Yes  Dressing Type Foam  - Lift dressing to assess site every shift 02/03/22 2120     Pressure Injury 01/28/22 Buttocks Right Deep Tissue Pressure Injury - Purple or maroon localized area of discolored intact skin or blood-filled blister due to damage of underlying soft tissue from pressure and/or shear. (Active)  01/28/22 1642  Location: Buttocks  Location Orientation: Right  Staging: Deep Tissue Pressure Injury - Purple or maroon localized area of discolored intact skin or blood-filled blister due to damage of underlying soft tissue from pressure and/or shear.  Wound Description (Comments):   Present on Admission:   Dressing Type Foam - Lift dressing to assess site every shift 02/03/22 2120    Consultants:  IR  Procedures:  As above  Antimicrobials:  Anti-infectives (From admission, onward)    Start     Dose/Rate Route Frequency Ordered Stop   01/31/22 2100  meropenem (MERREM) 1 g in sodium chloride 0.9 % 100 mL IVPB        1 g 200 mL/hr over 30 Minutes Intravenous Every 8 hours 01/31/22 1656 02/07/22 2359   01/31/22 1445  ceFEPIme (MAXIPIME) 2 g in sodium chloride 0.9 % 100 mL IVPB  Status:  Discontinued        2 g 200 mL/hr over 30 Minutes Intravenous Every 12 hours 01/31/22 1359 01/31/22 1656   01/27/22 2200  ceFEPIme (MAXIPIME) 2 g in sodium chloride 0.9 % 100 mL IVPB  Status:  Discontinued        2 g 200 mL/hr over 30 Minutes Intravenous Every 12 hours 01/27/22 1410 01/29/22 1200   01/26/22 2000  ceFEPIme (MAXIPIME) 2 g in sodium chloride 0.9 % 100 mL IVPB  Status:  Discontinued        2 g 200 mL/hr over 30 Minutes Intravenous Every 8 hours 01/26/22 1316 01/27/22 1410   01/26/22 1030  ceFEPIme (MAXIPIME) 2 g in sodium chloride 0.9 % 100 mL IVPB        2 g 200 mL/hr over 30 Minutes Intravenous  Once 01/26/22 1015 01/26/22 1317   01/26/22 1030  vancomycin (VANCOREADY) IVPB 2000 mg/400 mL  Status:  Discontinued        2,000 mg 200 mL/hr over 120 Minutes Intravenous  Once 01/26/22 1015 01/26/22 1629          Subjective:  Seen and examined.  She was slightly drowsy but oriented to self and place.  No complaints.  Objective: Vitals:   02/03/22 2120 02/04/22 0621 02/04/22 0749 02/04/22 1111  BP: (!) 151/67 (!) 147/80 120/65 (!) 158/107  Pulse: 83 71 (!) 53   Resp: '19 18 18   '$ Temp: 98 F (36.7 C)  97.7 F (36.5 C) 97.8 F (36.6 C)   TempSrc: Oral Axillary Oral   SpO2: 100% 100% 100%   Weight:      Height:        Intake/Output Summary (Last 24 hours) at 02/04/2022 1337 Last data filed at 02/04/2022 6160 Gross per 24 hour  Intake 152.9 ml  Output 550 ml  Net -397.1 ml    Filed Weights   01/29/22 0453 01/31/22 0500 02/01/22 0500  Weight: 84 kg 84 kg 84 kg    Examination:  General exam: Appears calm and comfortable but slightly drowsy Respiratory system: Clear to auscultation. Respiratory effort normal. Cardiovascular system: S1 & S2 heard, RRR. No JVD, murmurs, rubs, gallops or clicks. No pedal edema. Gastrointestinal system: Abdomen is nondistended, soft and nontender. No organomegaly or masses felt. Normal bowel sounds heard. Central nervous system: Drowsy and oriented x2 no focal neurological deficits. Extremities: Symmetric 5 x 5 power.   Data Reviewed: I have personally reviewed following labs and imaging studies  CBC: Recent Labs  Lab 01/31/22 0326 02/01/22 0458 02/02/22 0307 02/03/22 0249 02/04/22 0401  WBC 11.0* 10.4 11.5* 10.2 8.1  HGB 8.5* 8.8* 8.6* 8.5* 8.1*  HCT 25.5* 26.6* 27.2* 26.8* 26.0*  MCV 88.5 90.5 93.8 92.7 94.9  PLT 108* 106* 141* 147* 141*    Basic Metabolic Panel: Recent Labs  Lab 01/30/22 0432 01/31/22 0326 02/01/22 0458 02/02/22 0307 02/03/22 0249  NA 134* 136 134* 136 138  K 3.7 4.7 3.8 3.4* 3.8  CL 104 101 99 100 104  CO2 '24 26 26 28 27  '$ GLUCOSE 243* 185* 118* 265* 141*  BUN 35* 31* 24* 29* 23  CREATININE 0.95 0.62 0.65 0.61 0.52  CALCIUM 8.1* 8.3* 8.5* 9.2 9.3    GFR: Estimated Creatinine Clearance: 55.6  mL/min (by C-G formula based on SCr of 0.52 mg/dL). Liver Function Tests: Recent Labs  Lab 01/30/22 0432 01/31/22 0326 02/01/22 0458 02/02/22 0307  AST 75* 37 30 25  ALT 160* 113* 82* 64*  ALKPHOS 63 62 53 59  BILITOT 0.6 0.6 0.9 0.6  PROT 4.3* 4.5* 4.9* 5.0*  ALBUMIN 2.1* 2.2* 2.3* 2.2*    No results for input(s): "LIPASE", "AMYLASE" in the last 168 hours. No results for input(s): "AMMONIA" in the last 168 hours. Coagulation Profile: No results for input(s): "INR", "PROTIME" in the last 168 hours. Cardiac Enzymes: No results for input(s): "CKTOTAL", "CKMB", "CKMBINDEX", "TROPONINI" in the last 168 hours. BNP (last 3 results) No results for input(s): "PROBNP" in the last 8760 hours. HbA1C: No results for input(s): "HGBA1C" in the last 72 hours. CBG: Recent Labs  Lab 02/03/22 1136 02/03/22 1508 02/03/22 2023 02/04/22 0001 02/04/22 0521  GLUCAP 129* 111* 94 118* 84    Lipid Profile: No results for input(s): "CHOL", "HDL", "LDLCALC", "TRIG", "CHOLHDL", "LDLDIRECT" in the last 72 hours. Thyroid Function Tests: No results for input(s): "TSH", "T4TOTAL", "FREET4", "T3FREE", "THYROIDAB" in the last 72 hours. Anemia Panel: No results for input(s): "VITAMINB12", "FOLATE", "FERRITIN", "TIBC", "IRON", "RETICCTPCT" in the last 72 hours. Sepsis Labs: Recent Labs  Lab 01/30/22 1115  PROCALCITON 0.31     Recent Results (from the past 240 hour(s))  Resp Panel by RT-PCR (Flu A&B, Covid) Anterior Nasal Swab     Status: None   Collection Time: 01/26/22  9:10 AM   Specimen: Anterior Nasal Swab  Result Value Ref Range Status   SARS Coronavirus 2 by RT PCR NEGATIVE NEGATIVE Final    Comment: (NOTE) SARS-CoV-2 target  nucleic acids are NOT DETECTED.  The SARS-CoV-2 RNA is generally detectable in upper respiratory specimens during the acute phase of infection. The lowest concentration of SARS-CoV-2 viral copies this assay can detect is 138 copies/mL. A negative result does not  preclude SARS-Cov-2 infection and should not be used as the sole basis for treatment or other patient management decisions. A negative result may occur with  improper specimen collection/handling, submission of specimen other than nasopharyngeal swab, presence of viral mutation(s) within the areas targeted by this assay, and inadequate number of viral copies(<138 copies/mL). A negative result must be combined with clinical observations, patient history, and epidemiological information. The expected result is Negative.  Fact Sheet for Patients:  EntrepreneurPulse.com.au  Fact Sheet for Healthcare Providers:  IncredibleEmployment.be  This test is no t yet approved or cleared by the [place] FDA and  has been authorized for detection and/or diagnosis of SARS-CoV-2 by FDA under an Emergency Use Authorization (EUA). This EUA will remain  in effect (meaning this test can be used) for the duration of the COVID-19 declaration under Section 564(b)(1) of the Act, 21 U.S.C.section 360bbb-3(b)(1), unless the authorization is terminated  or revoked sooner.       Influenza A by PCR NEGATIVE NEGATIVE Final   Influenza B by PCR NEGATIVE NEGATIVE Final    Comment: (NOTE) The Xpert Xpress SARS-CoV-2/FLU/RSV plus assay is intended as an aid in the diagnosis of influenza from Nasopharyngeal swab specimens and should not be used as a sole basis for treatment. Nasal washings and aspirates are unacceptable for Xpert Xpress SARS-CoV-2/FLU/RSV testing.  Fact Sheet for Patients: EntrepreneurPulse.com.au  Fact Sheet for Healthcare Providers: IncredibleEmployment.be  This test is not yet approved or cleared by the [place] FDA and has been authorized for detection and/or diagnosis of SARS-CoV-2 by FDA under an Emergency Use Authorization (EUA). This EUA will remain in effect (meaning this test can be used) for the  duration of the COVID-19 declaration under Section 564(b)(1) of the Act, 21 U.S.C. section 360bbb-3(b)(1), unless the authorization is terminated or revoked.  Performed at [facility], [place] [address], [place], [place] [address]   Culture, blood (routine x 2)     Status: None   Collection Time: 01/26/22 11:38 AM   Specimen: BLOOD  Result Value Ref Range Status   Specimen Description BLOOD  Final   Special Requests   Final    BOTTLES DRAWN AEROBIC AND ANAEROBIC Blood Culture adequate volume   Culture   Final    NO GROWTH 5 DAYS Performed at [facility], [place] [address], [place], [place] [address]    Report Status 01/31/2022 FINAL  Final  MRSA Next Gen by PCR, Nasal     Status: None   Collection Time: 01/26/22  2:36 PM   Specimen: Nasal Mucosa; Nasal Swab  Result Value Ref Range Status   MRSA by PCR Next Gen NOT DETECTED NOT DETECTED Final    Comment: (NOTE) The GeneXpert MRSA Assay (FDA approved for NASAL specimens only), is one component of a comprehensive MRSA colonization surveillance program. It is not intended to diagnose MRSA infection nor to guide or monitor treatment for MRSA infections. Test performance is not FDA approved in patients less than 41 years old. Performed at [facility], [place] [address], [place], [place] [address]   Urine Culture     Status: None   Collection Time: 01/26/22  2:52 PM   Specimen: Urine, Catheterized  Result Value Ref Range  Status   Specimen Description URINE, CATHETERIZED  Final   Special Requests NONE  Final   Culture   Final    NO GROWTH Performed at [facility], [address] [place], [place] [address]    Report Status 01/27/2022 FINAL  Final  Expectorated Sputum Assessment w Gram Stain, Rflx to Resp Cult     Status: None   Collection Time: 01/30/22  9:49 AM   Specimen: Expectorated Sputum  Result Value Ref Range Status   Specimen Description EXPECTORATED SPUTUM  Final   Special Requests NONE  Final    Sputum evaluation   Final    THIS SPECIMEN IS ACCEPTABLE FOR SPUTUM CULTURE Performed at [facility], [place] [address] [place], [place] [address]    Report Status 02/01/2022 FINAL  Final  Culture, Respiratory w Gram Stain     Status: None   Collection Time: 01/30/22  9:49 AM  Result Value Ref Range Status   Specimen Description EXPECTORATED SPUTUM  Final   Special Requests NONE Reflexed from [id number]  Final   Gram Stain   Final    MODERATE WBC PRESENT,BOTH PMN AND MONONUCLEAR FEW GRAM POSITIVE COCCI IN CLUSTERS FEW GRAM NEGATIVE RODS FEW YEAST WITH PSEUDOHYPHAE Performed at [facility], [place] [address], [place], [place] [address]    Culture   Final    MODERATE KLEBSIELLA PNEUMONIAE Confirmed Extended Spectrum Beta-Lactamase Producer (ESBL).  In bloodstream infections from ESBL organisms, carbapenems are preferred over piperacillin/tazobactam. They are shown to have a lower risk of mortality.    Report Status 02/01/2022 FINAL  Final   Organism ID, Bacteria KLEBSIELLA PNEUMONIAE  Final      Susceptibility   Klebsiella pneumoniae - MIC*    AMPICILLIN >=32 RESISTANT Resistant     CEFAZOLIN >=64 RESISTANT Resistant     CEFEPIME >=32 RESISTANT Resistant     CEFTAZIDIME RESISTANT Resistant     CEFTRIAXONE >=64 RESISTANT Resistant     CIPROFLOXACIN <=0.25 SENSITIVE Sensitive     GENTAMICIN <=1 SENSITIVE Sensitive     IMIPENEM <=0.25 SENSITIVE Sensitive     TRIMETH/SULFA >=320 RESISTANT Resistant     AMPICILLIN/SULBACTAM >=32 RESISTANT Resistant     PIP/TAZO 16 SENSITIVE Sensitive     * MODERATE KLEBSIELLA PNEUMONIAE     Radiology Studies: DG Swallowing Func-Speech Pathology  Result Date: 02/04/2022 Table formatting from the original result was not included. Objective Swallowing Evaluation: Type of Study: MBS-Modified Barium Swallow Study  Patient Details Name: KOURTNI STINEMAN MRN: [id number] Date of Birth: [birth year] Today's Date: 02/04/2022 Time: SLP Start Time (ACUTE ONLY):  63 -SLP Stop Time (ACUTE ONLY): 0160 SLP Time Calculation (min) (ACUTE ONLY): 18 min Past Medical History: Past Medical History: Diagnosis Date  Arthritis   CKD (chronic kidney disease), stage III ([place])   patient denies  Depression   Diabetes mellitus without complication ([place])   Difficult intravenous access   Endometrial cancer ([place])   GERD (gastroesophageal reflux disease)   Headache   History of radiation therapy 11/04/2019-12/01/2019  Endometrial HDR; Dr. Gery Pray  Hypertension   Hypothyroidism   Neuromuscular disorder ([place])   neuropathy in feet  Osteoarthritis   PMB (postmenopausal bleeding)   PONV (postoperative nausea and vomiting)   severe nausea and vomiting after knee replacement 06-2014, did ok with 2018 knee replacement  Urinary frequency   Wears glasses  Past Surgical History: Past Surgical History: Procedure Laterality Date  BREAST SURGERY    cyst removed  CESAREAN SECTION  DILATION AND CURETTAGE OF UTERUS N/A 06/24/2019  Procedure: DILATATION AND CURETTAGE;  Surgeon: Lafonda Mosses, MD;  Location: [facility];  Service: Gynecology;  Laterality: N/A;  FRACTURE SURGERY    right knee  INTRAUTERINE DEVICE (IUD) INSERTION N/A 06/24/2019  Procedure: INTRAUTERINE DEVICE (IUD) INSERTION MIRENA;  Surgeon: Lafonda Mosses, MD;  Location: [facility];  Service: Gynecology;  Laterality: N/A;  IR ANGIO INTRA EXTRACRAN SEL COM CAROTID INNOMINATE UNI R MOD SED  11/05/2021  IR ANGIO VERTEBRAL SEL VERTEBRAL UNI R MOD SED  11/05/2021  IR ANGIOGRAM PULMONARY BILATERAL SELECTIVE  01/28/2022  IR ANGIOGRAM SELECTIVE EACH ADDITIONAL VESSEL  01/28/2022  IR ANGIOGRAM SELECTIVE EACH ADDITIONAL VESSEL  01/28/2022  IR CT HEAD LTD  11/01/2021  IR INTRA CRAN STENT  11/01/2021  IR RADIOLOGIST EVAL & MGMT  12/19/2021  IR THROMBECT PRIM MECH INIT (INCLU) MOD SED  01/28/2022  IR THROMBECT PRIM MECH INIT (INCLU) MOD SED  01/28/2022  IR US GUIDE VASC ACCESS RIGHT  11/01/2021  IR US GUIDE VASC ACCESS RIGHT   01/28/2022  IRRIGATION AND DEBRIDEMENT SEBACEOUS CYST    JOINT REPLACEMENT Right   right  LYMPH NODE DISSECTION N/A 09/14/2019  Procedure: LYMPH NODE DISSECTION;  Surgeon: Lafonda Mosses, MD;  Location: [facility];  Service: Gynecology;  Laterality: N/A;  RADIOLOGY WITH ANESTHESIA N/A 11/01/2021  Procedure: ANGIOPLASTY/STENT;  Surgeon: Luanne Bras, MD;  Location: [facility];  Service: Radiology;  Laterality: N/A;  RADIOLOGY WITH ANESTHESIA N/A 01/28/2022  Procedure: IR WITH ANESTHESIA;  Surgeon: Radiologist, Medication, MD;  Location: [facility];  Service: Radiology;  Laterality: N/A;  ROBOTIC ASSISTED TOTAL HYSTERECTOMY WITH BILATERAL SALPINGO OOPHERECTOMY Bilateral 09/14/2019  Procedure: XI ROBOTIC ASSISTED TOTAL HYSTERECTOMY WITH BILATERAL SALPINGO OOPHORECTOMY;  Surgeon: Lafonda Mosses, MD;  Location: [facility];  Service: Gynecology;  Laterality: Bilateral;  SENTINEL NODE BIOPSY N/A 09/14/2019  Procedure: SENTINEL NODE BIOPSY;  Surgeon: Lafonda Mosses, MD;  Location: [facility];  Service: Gynecology;  Laterality: N/A;  teeth extration    TONSILLECTOMY    TOTAL HIP ARTHROPLASTY Right 02/23/2015  Procedure: RIGHT TOTAL HIP ARTHROPLASTY ANTERIOR APPROACH;  Surgeon: Leandrew Koyanagi, MD;  Location: [facility];  Service: Orthopedics;  Laterality: Right;  TOTAL KNEE ARTHROPLASTY Left 06/27/2016  Procedure: LEFT TOTAL KNEE ARTHROPLASTY;  Surgeon: Leandrew Koyanagi, MD;  Location: [facility];  Service: Orthopedics;  Laterality: Left; HPI: SANAYA GWILLIAM is a 81 y.o. female with medical history significant for early stage uterine cancer, hypertension, type 2 diabetes, hypothyroidism, GERD, CKD 3B, chronic anxiety/depression, osteoarthritis, CVA and meningioma with left-sided deficit and speech difficulty, who presented to [facility] ED due to confusion x 2 days. In the ED, work-up revealed CT head with stable meningioma measuring 5 cm and associated vasogenic edema and regional mass effect.  Underwent IR thrombectomy 10/9. FEES 10/2021 penetration thin and  nectar, wet vocal quality throughout, D3/nectar recommended.  Subjective: cooperative but confused  Recommendations for follow up therapy are one component of a multi-disciplinary discharge planning process, led by the attending physician.  Recommendations may be updated based on patient status, additional functional criteria and insurance authorization. Assessment / Plan / Recommendation   02/04/2022  10:43 AM Clinical Impressions Clinical Impression Pt presents with severe oropharyngeal dysphagia largely decreased timing of laryngeal closure/swallow initiation delay to the level of the valleculae, which did not improve with any consistency. During thin and nectar thick liquid trials bolus material spilled from the valleculae into the laryngeal vestibule prior to the  initiation of the swallow. Silent aspiration (PAS 8) noted with thin liquids, and penetration (PAS 3/5) noted with nectar thick liquids prior to the swallow. Residue from thin/nectar thick liquids remained in laryngeal vestibule throughout study. Residue in the valleculae and pyriform sinuses present with trials of nectar and honey thick liquids, which increased in amount and was noted in the lateral channels with puree administration. Prolonged oral transit also present with puree adminsitration. Instances of penetration displayed with honey thick liquids via cup (PAS 2). Throughout study pt did not sense bolus material in airway and was cognitively unable to follow commands to cough/throat clear. Anatomically, pt presents with a cricopharyngeal bar. Recommend pt start honey thick liquids via teaspoon only/puree consistency diet, medications crushed in puree. MD consulted and agreed with diet recommendations given aspiration risk and may consult Palliative care. SLP will follow for diet toleration and education. SLP Visit Diagnosis Dysphagia, oropharyngeal phase (R13.12) Impact on safety and function Severe aspiration risk     02/04/2022  10:43 AM  Treatment Recommendations Treatment Recommendations Therapy as outlined in treatment plan below     02/04/2022  10:43 AM Prognosis Prognosis for Safe Diet Advancement Fair Barriers to Reach Goals Severity of deficits;Cognitive deficits   02/04/2022  10:43 AM Diet Recommendations SLP Diet Recommendations Honey thick liquids;Dysphagia 1 (Puree) solids;Other (Comment) Liquid Administration via Spoon Medication Administration Whole meds with puree Compensations Slow rate;Small sips/bites;Minimize environmental distractions Postural Changes Seated upright at 90 degrees     02/04/2022  10:43 AM Other Recommendations Oral Care Recommendations Oral care BID Follow Up Recommendations Long-term institutional care without follow-up therapy Assistance recommended at discharge Frequent or constant Supervision/Assistance Functional Status Assessment Patient has had a recent decline in their functional status and demonstrates the ability to make significant improvements in function in a reasonable and predictable amount of time.   02/04/2022  10:43 AM Frequency and Duration  Speech Therapy Frequency (ACUTE ONLY) min 2x/week Treatment Duration 2 weeks     02/04/2022  10:43 AM Oral Phase Oral Phase Impaired Oral - Honey Teaspoon WFL Oral - Honey Cup WFL Oral - Nectar Teaspoon NT Oral - Nectar Cup WFL Oral - Nectar Straw NT Oral - Thin Teaspoon NT Oral - Thin Cup WFL Oral - Thin Straw NT Oral - Puree Delayed oral transit;Decreased bolus cohesion Oral - Mech Soft NT Oral - Regular NT Oral - Multi-Consistency NT Oral - Pill NT    02/04/2022  10:43 AM Pharyngeal Phase Pharyngeal Phase Impaired Pharyngeal- Honey Teaspoon Delayed swallow initiation-vallecula;Reduced pharyngeal peristalsis;Penetration/Aspiration during swallow;Pharyngeal residue - pyriform;Pharyngeal residue - valleculae Pharyngeal Material enters airway, remains ABOVE vocal cords and not ejected out Pharyngeal- Honey Cup Delayed swallow initiation-vallecula;Delayed  swallow initiation-pyriform sinuses;Penetration/Aspiration during swallow;Pharyngeal residue - pyriform;Pharyngeal residue - valleculae Pharyngeal Material enters airway, remains ABOVE vocal cords and not ejected out Pharyngeal- Nectar Teaspoon NT Pharyngeal- Nectar Cup Delayed swallow initiation-vallecula;Penetration/Aspiration before swallow;Pharyngeal residue - pyriform;Pharyngeal residue - valleculae Pharyngeal Material enters airway, CONTACTS cords and not ejected out;Material enters airway, remains ABOVE vocal cords and not ejected out Pharyngeal- Nectar Straw NT Pharyngeal- Thin Teaspoon NT Pharyngeal- Thin Cup Trace aspiration;Penetration/Aspiration before swallow;Delayed swallow initiation-vallecula Pharyngeal Material enters airway, passes BELOW cords without attempt by patient to eject out (silent aspiration) Pharyngeal- Thin Straw NT Pharyngeal- Puree Delayed swallow initiation-vallecula;Pharyngeal residue - valleculae;Pharyngeal residue - pyriform;Lateral channel residue Pharyngeal Material does not enter airway Pharyngeal- Mechanical Soft NT Pharyngeal- Regular NT Pharyngeal- Multi-consistency NT Pharyngeal- Pill NT    02/04/2022  10:43 AM Cervical Esophageal Phase  Cervical Esophageal Phase -- [name] 02/04/2022, 11:43 AM                     DG Abd 1 View  Result Date: 02/03/2022 CLINICAL DATA:  NG tube placement EXAM: ABDOMEN - 1 VIEW COMPARISON:  02/01/2022 FINDINGS: There is interval removal of feeding tube. There is interval placement of NG tube which follows the course of trachea and right main bronchus with its tip in the medial right lower lung fields. NG tube should be removed and repositioned. Cardiac size is within normal limits. Thoracic aorta is tortuous and ectatic. Lung fields are clear of any focal consolidation or pulmonary edema. Small linear density in the medial left lower lung fields may suggest subsegmental atelectasis. There is no pleural effusion or  pneumothorax. Tip of right PICC line is seen in the region of junction of inferior vena cava and right atrium. IMPRESSION: Tip of NG tube is noted in the medial right lower lung field suggesting ectopic location in the bronchus. Lung fields are clear of any focal consolidation or pulmonary edema. Small linear density in medial left lower lung fields suggest subsegmental atelectasis. Tip of right PICC line is noted in the region of junction of inferior vena cava and right atrium. Imaging finding of NG tube to be in bronchus in the medial right lower lung field was relayed to Dr. Doristine Bosworth by telephone call at 11:56 a.m. on 02/03/2022. Electronically Signed   By: Elmer Picker M.D.   On: 02/03/2022 12:01    Scheduled Meds:  Chlorhexidine Gluconate Cloth  6 each Topical Q0600   docusate  100 mg Per Tube BID   enoxaparin (LOVENOX) injection  1 mg/kg Subcutaneous Q12H   feeding supplement (PROSource TF20)  60 mL Per Tube Daily   furosemide  40 mg Intravenous Daily   insulin aspart  0-15 Units Subcutaneous Q4H   insulin aspart  0-5 Units Subcutaneous QHS   insulin glargine-yfgn  10 Units Subcutaneous QHS   metoprolol tartrate  5 mg Intravenous Q6H   mouth rinse  15 mL Mouth Rinse 4 times per day   pantoprazole  40 mg Per Tube Daily   polyethylene glycol  17 g Per Tube BID   rosuvastatin  20 mg Per Tube Daily   senna  2 tablet Per Tube Daily   sodium chloride flush  10-40 mL Intracatheter Q12H   Continuous Infusions:  sodium chloride 10 mL/hr at 02/01/22 1626   sodium chloride 50 mL/hr at 02/03/22 1327   feeding supplement (OSMOLITE 1.5 CAL) 1,000 mL (02/02/22 1517)   meropenem (MERREM) IV 1 g (02/04/22 0544)     LOS: 9 days   Darliss Cheney, MD [facility]  02/04/2022, 1:37 PM   *Please note that this is a verbal dictation therefore any spelling or grammatical errors are due to the "[place] [facility]" system interpretation.  Please page via [place] and do not message via secure  chat for urgent patient care matters. Secure chat can be used for non urgent patient care matters.  How to contact the [facility] Attending or Consulting provider [place] or covering provider during after hours [place], for this patient?  Check the care team in [facility] and look for a) attending/consulting TRH provider listed and b) the [facility] team listed. Page or secure chat [phone]. Log into [email or web address] and use [place]'s universal password to access. If you do not have the password, please contact the hospital operator. Locate the  [place] provider you are looking for under [facility] and page to a number that you can be directly reached. If you still have difficulty reaching the provider, please page the [facility] (Director on Call) for the Hospitalists listed on amion for assistance.

## 2022-02-04 NOTE — TOC Progression Note (Signed)
Transition of Care [facility]) - Progression Note    Patient Details  Name: Cassandra Dodson MRN: [id number] Date of Birth: Sep 12, 1940  [facility] CM/SW Contact  [name], LCSW Phone Number: 02/04/2022, 3:17 PM  Clinical Narrative: CSW spoke with [name] by phone regarding bed offers. He asked that CSW leave bed offers in room, he will be by this evening and will review.        Expected Discharge Plan: [place] Barriers to Discharge: Continued Medical Work up, SNF Pending bed offer  Expected Discharge Plan and Services Expected Discharge Plan: [place] In-house Referral: Clinical Social Work   Post Acute Care Choice: [place] Living arrangements for the past 2 months: Single Family Home                                       Social Determinants of Health (SDOH) Interventions    Readmission Risk Interventions     No data to display

## 2022-02-04 NOTE — Care Management Important Message (Signed)
Important Message  Patient Details  Name: Cassandra Dodson MRN: [id number] Date of Birth: 1940-12-01   Medicare Important Message Given:  Yes  Patient has a contact  precautions order in place will mail the IM to the patient home address.     Latyra Jaye 02/04/2022, 3:36 PM

## 2022-02-04 NOTE — Plan of Care (Signed)
°  Problem: Health Behavior/Discharge Planning: °Goal: Ability to manage health-related needs will improve °Outcome: Progressing °  °Problem: Clinical Measurements: °Goal: Cardiovascular complication will be avoided °Outcome: Progressing °  °Problem: Activity: °Goal: Risk for activity intolerance will decrease °Outcome: Progressing °  °Problem: Nutrition: °Goal: Adequate nutrition will be maintained °Outcome: Progressing °  °Problem: Coping: °Goal: Level of anxiety will decrease °Outcome: Progressing °  °

## 2022-02-05 DIAGNOSIS — R1310 Dysphagia, unspecified: Secondary | ICD-10-CM | POA: Diagnosis not present

## 2022-02-05 DIAGNOSIS — Z7189 Other specified counseling: Secondary | ICD-10-CM

## 2022-02-05 DIAGNOSIS — I2602 Saddle embolus of pulmonary artery with acute cor pulmonale: Secondary | ICD-10-CM | POA: Diagnosis not present

## 2022-02-05 DIAGNOSIS — Z515 Encounter for palliative care: Secondary | ICD-10-CM

## 2022-02-05 DIAGNOSIS — I6322 Cerebral infarction due to unspecified occlusion or stenosis of basilar arteries: Secondary | ICD-10-CM | POA: Diagnosis not present

## 2022-02-05 LAB — GLUCOSE, CAPILLARY
Glucose-Capillary: 120 mg/dL — ABNORMAL HIGH (ref 70–99)
Glucose-Capillary: 99 mg/dL (ref 70–99)
Glucose-Capillary: 114 mg/dL — ABNORMAL HIGH (ref 70–99)
Glucose-Capillary: 164 mg/dL — ABNORMAL HIGH (ref 70–99)
Glucose-Capillary: 135 mg/dL — ABNORMAL HIGH (ref 70–99)

## 2022-02-05 MED ORDER — PANTOPRAZOLE 2 MG/ML SUSPENSION
40.0000 mg | Freq: Every day | ORAL | Status: DC
  Administered 2022-02-05 – 2022-02-16 (×12): 40 mg via ORAL
  Filled 2022-02-05 (×13): qty 20

## 2022-02-05 MED ORDER — APIXABAN 5 MG PO TABS
5.0000 mg | ORAL_TABLET | Freq: Two times a day (BID) | ORAL | Status: DC
  Administered 2022-02-08 – 2022-02-16 (×16): 5 mg via ORAL
  Filled 2022-02-05 (×17): qty 1

## 2022-02-05 MED ORDER — ROSUVASTATIN CALCIUM 20 MG PO TABS
20.0000 mg | ORAL_TABLET | Freq: Every day | ORAL | Status: DC
  Administered 2022-02-05 – 2022-02-16 (×12): 20 mg via ORAL
  Filled 2022-02-05 (×11): qty 1

## 2022-02-05 MED ORDER — APIXABAN 5 MG PO TABS
10.0000 mg | ORAL_TABLET | Freq: Two times a day (BID) | ORAL | Status: AC
  Administered 2022-02-05 – 2022-02-07 (×6): 10 mg via ORAL
  Filled 2022-02-05 (×6): qty 2

## 2022-02-05 MED ORDER — SENNA 8.6 MG PO TABS
2.0000 | ORAL_TABLET | Freq: Every day | ORAL | Status: DC
  Administered 2022-02-05 – 2022-02-14 (×10): 17.2 mg via ORAL
  Filled 2022-02-05 (×11): qty 2

## 2022-02-05 MED ORDER — DOCUSATE SODIUM 50 MG/5ML PO LIQD
100.0000 mg | Freq: Two times a day (BID) | ORAL | Status: DC
  Administered 2022-02-05 – 2022-02-16 (×22): 100 mg via ORAL
  Filled 2022-02-05 (×22): qty 10

## 2022-02-05 MED ORDER — POLYETHYLENE GLYCOL 3350 17 G PO PACK
17.0000 g | PACK | Freq: Two times a day (BID) | ORAL | Status: DC
  Administered 2022-02-05 – 2022-02-16 (×22): 17 g via ORAL
  Filled 2022-02-05 (×22): qty 1

## 2022-02-05 MED ORDER — ACETAMINOPHEN 325 MG PO TABS
650.0000 mg | ORAL_TABLET | ORAL | Status: DC | PRN
  Administered 2022-02-05: 650 mg via ORAL
  Filled 2022-02-05: qty 2

## 2022-02-05 NOTE — Progress Notes (Signed)
ANTICOAGULATION CONSULT NOTE - Follow Up Consult  Pharmacy Consult for Enoxaparin >> Eliquis Indication: pulmonary embolus  Allergies  Allergen Reactions   Anesthetics, Amide Other (See Comments)    Pt is intolerant to general anesthesia. Pt will throw up and has thrown up during procedure.    Ether Nausea And Vomiting    Patient Measurements: Height: '5\' 3"'$  (160 cm) Weight: 84 kg (185 lb 3 oz) IBW/kg (Calculated) : 52.4  Vital Signs: Temp: 98.1 F (36.7 C) (10/17 0751) Temp Source: Oral (10/17 0751) BP: 125/68 (10/17 0751) Pulse Rate: 71 (10/17 0751)  Labs: Recent Labs    02/03/22 0249 02/04/22 0401  HGB 8.5* 8.1*  HCT 26.8* 26.0*  PLT 147* 141*  CREATININE 0.52  --      Estimated Creatinine Clearance: 55.6 mL/min (by C-G formula based on SCr of 0.52 mg/dL).   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 Patient was started on enoxaparin but had bleeding at CVC site s/p thrombi pad x3. No anticoagulation prior to admission.   Patient was transitioned from Eliquis to Lovenox on 10/15 as Cortrak was removed and NGT difficult to place. Passed swallow exam and was started on dysphagia 1 diet - PO intake improved and able to tolerate meds. Will switch Lovenox back to Eliquis. Patient completed ~4 days of a full anticoag load between Lovenox, Eliquis, and IV heparin so will continue Eliquis load for 3 more days.  Goal of Therapy:  Monitor platelets by anticoagulation protocol: Yes   Plan:  Eliquis '10mg'$  PO BID x 3 more days then transition to '5mg'$  PO BID thereafter   Dimple Nanas, PharmD, BCPS 02/05/2022 11:56 AM

## 2022-02-05 NOTE — Progress Notes (Signed)
PROGRESS NOTE    Cassandra Dodson  NTI:[id number] DOB: Oct 03, 1940 DOA: 01/26/2022 PCP: Shon Baton, MD   Brief Narrative:  81 year old female with past medical history of meningioma and multiple CVAs most recently late 12/2021 admitted for altered mental status.  Patient found to have a pulmonary embolism.  Given altered mental status, patient required to be intubated.  Patient had low blood pressures requiring Levophed.  Patient was started on Lovenox for PE and admitted to ICU for further work-up.  During hospital course, patient started bruising very badly, and hemoglobin started to drop after initiation of Lovenox, and anticoagulation was stopped.  Patient then had a mechanical thrombectomy once stable to retrieve clot.  Patient required 2 units of blood, and patient's hemoglobin remained stable afterwards.  Heparin was initiated, and patient's hemoglobin remains stable.  Patient was eventually weaned off ventilator back to room air.  Patient's blood pressures remained stable and no longer requiring pressors.  Patient is currently requiring antibiotics for pneumonia. Patient's platelets during hospitalization have started to drop, and HIT antibodies have been ordered.  Patient was transitioned from heparin to Eliquis after core track is placed.  PT OT recommends SNF.  Patient was transferred to [facility] on 02/02/2022.  Assessment & Plan:   Principal Problem:   Pulmonary embolism with acute cor pulmonale (HCC) Active Problems:   Brain mass   CVA (cerebral vascular accident) ([place])   Pressure injury of skin   Malnutrition of moderate degree  Acute metabolic encephalopathy, likely secondary to saddle massive pulmonary embolism and severe sepsis, resolving Patient now been extubated.  Patient is status post mechanical thrombectomy 01/28/2022.  Patient is off of all pressors and maintaining blood pressure on her own.   -Continue midodrine 10 mg every 8 hours.  She was transitioned to Eliquis however due to  her taking out contracting, she was transitioned to Lovenox but now she is eating more so we will transition back to Eliquis.  Currently she is oriented to self and place.   Hypokalemia: Resolved.  #Acute hypoxic respiratory failure secondary to PE and Klebsiella pneumoniae: Improving.  Afebrile.  Very mild leukocytosis.  She is on room air.  Continue meropenem for 5 days.  Has history of ESBL.  Continue bronchodilators.   #Blood loss anemia: Likely secondary to anticoagulation.  Patient's hemoglobin suddenly dropped from 8.5-7.3 on 01/29/2022 and she received 2 units of PRBC transfusion.  Anticoagulation was stopped briefly.  It has been resumed ever since.  Hemoglobin has remained stable between 8 and 9 ever since.  Monitor closely.  #Thrombocytopenia: Stable.  No signs of bleeding.  HIT antibodies negative.  Continue Eliquis.  #Type 2 diabetes mellitus: Blood sugar controlled.  Continue 10 years of Lantus.  SSI.   #Meningioma Stable at this time.  No intervention needed at this time.  Follow-up with neurosurgery as outpatient.   #Stage I sacral decubitus ulcer and heel ulcer #Present on admission Stable -Continue wound care   #Elevated liver enzymes, resolving   Dysphagia/poor p.o. intake: She pulled her cortrak 02/03/2022.  The nurse was unsuccessful to place NG tube.   She was seen by speech and they have started her on dysphagia 1 diet.  Per them, she is likely going to require PEG tube.  I had a very lengthy discussion with patient's son and he wants to wait for 2 to 3 days to see if his mother is going to eat by herself enough to avoid PEG tube.  He believes that she is improving.  Reportedly, she is eating more food now.  Hopefully she will not need PEG tube.  Disposition: On 02/04/2022, discussed with the son in length that as long as she has cortrak in place, she will not be able to discharge to SNF.  He was very adamant that he would like to keep his mother in the hospital and not  let her go to SNF until she is able to eat by herself and get some therapy.  He said that " me and my mother are fighters, I am ready to fight with whoever I have to, anybody in the hospital or the insurance company to keep my mother in the hospital as long as she needs to because I am afraid that if she were to go home, she will develop other problems and might not make it and I want her to be safe and I want her tumor to be removed from her surgeon after she recovers from this as well" I have consulted palliative care to help.  DVT prophylaxis:   Eliquis   Code Status: Full Code  Family Communication:  None present at bedside.  Plan of care discussed with the son over the phone yesterday.  Status is: Inpatient Remains inpatient appropriate because: Patient with dysphagia and require SNF.   Estimated body mass index is 32.8 kg/m as calculated from the following:   Height as of this encounter: '5\' 3"'$  (1.6 m).   Weight as of this encounter: 84 kg.  Pressure Injury 01/26/22 Buttocks Mid Stage 1 -  Intact skin with non-blanchable redness of a localized area usually over a bony prominence. (Active)  01/26/22 1549  Location: Buttocks  Location Orientation: Mid  Staging: Stage 1 -  Intact skin with non-blanchable redness of a localized area usually over a bony prominence.  Wound Description (Comments):   Present on Admission: Yes  Dressing Type Foam - Lift dressing to assess site every shift 02/04/22 2100     Pressure Injury 01/26/22 Heel Left Stage 1 -  Intact skin with non-blanchable redness of a localized area usually over a bony prominence. (Active)  01/26/22 1500  Location: Heel  Location Orientation: Left  Staging: Stage 1 -  Intact skin with non-blanchable redness of a localized area usually over a bony prominence.  Wound Description (Comments):   Present on Admission: Yes  Dressing Type Foam - Lift dressing to assess site every shift 02/04/22 2100     Pressure Injury 01/28/22  Buttocks Right Deep Tissue Pressure Injury - Purple or maroon localized area of discolored intact skin or blood-filled blister due to damage of underlying soft tissue from pressure and/or shear. (Active)  01/28/22 1642  Location: Buttocks  Location Orientation: Right  Staging: Deep Tissue Pressure Injury - Purple or maroon localized area of discolored intact skin or blood-filled blister due to damage of underlying soft tissue from pressure and/or shear.  Wound Description (Comments):   Present on Admission:   Dressing Type Foam - Lift dressing to assess site every shift 02/04/22 2100   Nutritional Assessment: Body mass index is 32.8 kg/m.Marland Kitchen Seen by dietician.  I agree with the assessment and plan as outlined below: Nutrition Status: Nutrition Problem: Moderate Malnutrition Etiology: chronic illness (meningioma) Signs/Symptoms: mild fat depletion, moderate muscle depletion Interventions: Tube feeding  . Skin Assessment: I have examined the patient's skin and I agree with the wound assessment as performed by the wound care RN as outlined below: Pressure Injury 01/26/22 Buttocks Mid Stage 1 -  Intact skin  with non-blanchable redness of a localized area usually over a bony prominence. (Active)  01/26/22 1549  Location: Buttocks  Location Orientation: Mid  Staging: Stage 1 -  Intact skin with non-blanchable redness of a localized area usually over a bony prominence.  Wound Description (Comments):   Present on Admission: Yes  Dressing Type Foam - Lift dressing to assess site every shift 02/04/22 2100     Pressure Injury 01/26/22 Heel Left Stage 1 -  Intact skin with non-blanchable redness of a localized area usually over a bony prominence. (Active)  01/26/22 1500  Location: Heel  Location Orientation: Left  Staging: Stage 1 -  Intact skin with non-blanchable redness of a localized area usually over a bony prominence.  Wound Description (Comments):   Present on Admission: Yes  Dressing  Type Foam - Lift dressing to assess site every shift 02/04/22 2100     Pressure Injury 01/28/22 Buttocks Right Deep Tissue Pressure Injury - Purple or maroon localized area of discolored intact skin or blood-filled blister due to damage of underlying soft tissue from pressure and/or shear. (Active)  01/28/22 1642  Location: Buttocks  Location Orientation: Right  Staging: Deep Tissue Pressure Injury - Purple or maroon localized area of discolored intact skin or blood-filled blister due to damage of underlying soft tissue from pressure and/or shear.  Wound Description (Comments):   Present on Admission:   Dressing Type Foam - Lift dressing to assess site every shift 02/04/22 2100    Consultants:  IR  Procedures:  As above  Antimicrobials:  Anti-infectives (From admission, onward)    Start     Dose/Rate Route Frequency Ordered Stop   01/31/22 2100  meropenem (MERREM) 1 g in sodium chloride 0.9 % 100 mL IVPB        1 g 200 mL/hr over 30 Minutes Intravenous Every 8 hours 01/31/22 1656 02/07/22 2359   01/31/22 1445  ceFEPIme (MAXIPIME) 2 g in sodium chloride 0.9 % 100 mL IVPB  Status:  Discontinued        2 g 200 mL/hr over 30 Minutes Intravenous Every 12 hours 01/31/22 1359 01/31/22 1656   01/27/22 2200  ceFEPIme (MAXIPIME) 2 g in sodium chloride 0.9 % 100 mL IVPB  Status:  Discontinued        2 g 200 mL/hr over 30 Minutes Intravenous Every 12 hours 01/27/22 1410 01/29/22 1200   01/26/22 2000  ceFEPIme (MAXIPIME) 2 g in sodium chloride 0.9 % 100 mL IVPB  Status:  Discontinued        2 g 200 mL/hr over 30 Minutes Intravenous Every 8 hours 01/26/22 1316 01/27/22 1410   01/26/22 1030  ceFEPIme (MAXIPIME) 2 g in sodium chloride 0.9 % 100 mL IVPB        2 g 200 mL/hr over 30 Minutes Intravenous  Once 01/26/22 1015 01/26/22 1317   01/26/22 1030  vancomycin (VANCOREADY) IVPB 2000 mg/400 mL  Status:  Discontinued        2,000 mg 200 mL/hr over 120 Minutes Intravenous  Once 01/26/22 1015  01/26/22 1629         Subjective:  Patient seen and examined.  Sleepy but arousable.  Oriented to self and person.  Has no complaints.  Appears comfortable.  Objective: Vitals:   02/05/22 0000 02/05/22 0300 02/05/22 0751 02/05/22 1208  BP: 105/73 136/72 125/68 (!) 152/135  Pulse: 93 82 71 92  Resp: '19  19 18  '$ Temp:  98 F (36.7 C) 98.1 F (36.7 C)  98.8 F (37.1 C)  TempSrc:  Oral Oral Oral  SpO2: 96% 96% 97% 100%  Weight:      Height:        Intake/Output Summary (Last 24 hours) at 02/05/2022 1236 Last data filed at 02/04/2022 2209 Gross per 24 hour  Intake 10 ml  Output 1600 ml  Net -1590 ml    Filed Weights   01/29/22 0453 01/31/22 0500 02/01/22 0500  Weight: 84 kg 84 kg 84 kg    Examination:  General exam: Appears calm and comfortable  Respiratory system: Clear to auscultation. Respiratory effort normal. Cardiovascular system: S1 & S2 heard, RRR. No JVD, murmurs, rubs, gallops or clicks. No pedal edema. Gastrointestinal system: Abdomen is nondistended, soft and nontender. No organomegaly or masses felt. Normal bowel sounds heard. Central nervous system: Alert and oriented x2. No focal neurological deficits. Extremities: Symmetric 5 x 5 power.  Data Reviewed: I have personally reviewed following labs and imaging studies  CBC: Recent Labs  Lab 01/31/22 0326 02/01/22 0458 02/02/22 0307 02/03/22 0249 02/04/22 0401  WBC 11.0* 10.4 11.5* 10.2 8.1  HGB 8.5* 8.8* 8.6* 8.5* 8.1*  HCT 25.5* 26.6* 27.2* 26.8* 26.0*  MCV 88.5 90.5 93.8 92.7 94.9  PLT 108* 106* 141* 147* 141*    Basic Metabolic Panel: Recent Labs  Lab 01/30/22 0432 01/31/22 0326 02/01/22 0458 02/02/22 0307 02/03/22 0249  NA 134* 136 134* 136 138  K 3.7 4.7 3.8 3.4* 3.8  CL 104 101 99 100 104  CO2 '24 26 26 28 27  '$ GLUCOSE 243* 185* 118* 265* 141*  BUN 35* 31* 24* 29* 23  CREATININE 0.95 0.62 0.65 0.61 0.52  CALCIUM 8.1* 8.3* 8.5* 9.2 9.3    GFR: Estimated Creatinine Clearance:  55.6 mL/min (by C-G formula based on SCr of 0.52 mg/dL). Liver Function Tests: Recent Labs  Lab 01/30/22 0432 01/31/22 0326 02/01/22 0458 02/02/22 0307  AST 75* 37 30 25  ALT 160* 113* 82* 64*  ALKPHOS 63 62 53 59  BILITOT 0.6 0.6 0.9 0.6  PROT 4.3* 4.5* 4.9* 5.0*  ALBUMIN 2.1* 2.2* 2.3* 2.2*    No results for input(s): "LIPASE", "AMYLASE" in the last 168 hours. No results for input(s): "AMMONIA" in the last 168 hours. Coagulation Profile: No results for input(s): "INR", "PROTIME" in the last 168 hours. Cardiac Enzymes: No results for input(s): "CKTOTAL", "CKMB", "CKMBINDEX", "TROPONINI" in the last 168 hours. BNP (last 3 results) No results for input(s): "PROBNP" in the last 8760 hours. HbA1C: No results for input(s): "HGBA1C" in the last 72 hours. CBG: Recent Labs  Lab 02/04/22 2211 02/04/22 2324 02/05/22 0406 02/05/22 0810 02/05/22 1125  GLUCAP 149* 132* 120* 99 114*    Lipid Profile: No results for input(s): "CHOL", "HDL", "LDLCALC", "TRIG", "CHOLHDL", "LDLDIRECT" in the last 72 hours. Thyroid Function Tests: No results for input(s): "TSH", "T4TOTAL", "FREET4", "T3FREE", "THYROIDAB" in the last 72 hours. Anemia Panel: No results for input(s): "VITAMINB12", "FOLATE", "FERRITIN", "TIBC", "IRON", "RETICCTPCT" in the last 72 hours. Sepsis Labs: Recent Labs  Lab 01/30/22 1115  PROCALCITON 0.31     Recent Results (from the past 240 hour(s))  MRSA Next Gen by PCR, Nasal     Status: None   Collection Time: 01/26/22  2:36 PM   Specimen: Nasal Mucosa; Nasal Swab  Result Value Ref Range Status   MRSA by PCR Next Gen NOT DETECTED NOT DETECTED Final    Comment: (NOTE) The GeneXpert MRSA Assay (FDA approved for NASAL specimens only), is one  component of a comprehensive MRSA colonization surveillance program. It is not intended to diagnose MRSA infection nor to guide or monitor treatment for MRSA infections. Test performance is not FDA approved in patients less than  7 years old. Performed at [facility], [place] [address] [place], [place] [address]   Urine Culture     Status: None   Collection Time: 01/26/22  2:52 PM   Specimen: Urine, Catheterized  Result Value Ref Range Status   Specimen Description URINE, CATHETERIZED  Final   Special Requests NONE  Final   Culture   Final    NO GROWTH Performed at [facility], [address], [place], [place] [address]    Report Status 01/27/2022 FINAL  Final  Expectorated Sputum Assessment w Gram Stain, Rflx to Resp Cult     Status: None   Collection Time: 01/30/22  9:49 AM   Specimen: Expectorated Sputum  Result Value Ref Range Status   Specimen Description EXPECTORATED SPUTUM  Final   Special Requests NONE  Final   Sputum evaluation   Final    THIS SPECIMEN IS ACCEPTABLE FOR SPUTUM CULTURE Performed at [facility], [place] [address], [place], [place] [address]    Report Status 02/01/2022 FINAL  Final  Culture, Respiratory w Gram Stain     Status: None   Collection Time: 01/30/22  9:49 AM  Result Value Ref Range Status   Specimen Description EXPECTORATED SPUTUM  Final   Special Requests NONE Reflexed from [id number]  Final   Gram Stain   Final    MODERATE WBC PRESENT,BOTH PMN AND MONONUCLEAR FEW GRAM POSITIVE COCCI IN CLUSTERS FEW GRAM NEGATIVE RODS FEW YEAST WITH PSEUDOHYPHAE Performed at [facility], [place] [address] [place], [place] [address]    Culture   Final    MODERATE KLEBSIELLA PNEUMONIAE Confirmed Extended Spectrum Beta-Lactamase Producer (ESBL).  In bloodstream infections from ESBL organisms, carbapenems are preferred over piperacillin/tazobactam. They are shown to have a lower risk of mortality.    Report Status 02/01/2022 FINAL  Final   Organism ID, Bacteria KLEBSIELLA PNEUMONIAE  Final      Susceptibility   Klebsiella pneumoniae - MIC*    AMPICILLIN >=32 RESISTANT Resistant     CEFAZOLIN >=64 RESISTANT Resistant     CEFEPIME >=32 RESISTANT Resistant      CEFTAZIDIME RESISTANT Resistant     CEFTRIAXONE >=64 RESISTANT Resistant     CIPROFLOXACIN <=0.25 SENSITIVE Sensitive     GENTAMICIN <=1 SENSITIVE Sensitive     IMIPENEM <=0.25 SENSITIVE Sensitive     TRIMETH/SULFA >=320 RESISTANT Resistant     AMPICILLIN/SULBACTAM >=32 RESISTANT Resistant     PIP/TAZO 16 SENSITIVE Sensitive     * MODERATE KLEBSIELLA PNEUMONIAE     Radiology Studies: DG Swallowing Func-Speech Pathology  Result Date: 02/04/2022 Table formatting from the original result was not included. Objective Swallowing Evaluation: Type of Study: MBS-Modified Barium Swallow Study  Patient Details Name: Cassandra Dodson MRN: [id number] Date of Birth: [birth year] Today's Date: 02/04/2022 Time: SLP Start Time (ACUTE ONLY): 1010 -SLP Stop Time (ACUTE ONLY): 1028 SLP Time Calculation (min) (ACUTE ONLY): 18 min Past Medical History: Past Medical History: Diagnosis Date  Arthritis   CKD (chronic kidney disease), stage III ([place])   patient denies  Depression   Diabetes mellitus without complication ([place])   Difficult intravenous access   Endometrial cancer ([place])   GERD (gastroesophageal reflux disease)   Headache   History of radiation therapy 11/04/2019-12/01/2019  Endometrial HDR; Dr. Gery Pray  Hypertension   Hypothyroidism   Neuromuscular disorder ([place])   neuropathy in feet  Osteoarthritis   PMB (postmenopausal bleeding)   PONV (postoperative nausea and vomiting)   severe nausea and vomiting after knee replacement 06-2014, did ok with 2018 knee replacement  Urinary frequency   Wears glasses  Past Surgical History: Past Surgical History: Procedure Laterality Date  BREAST SURGERY    cyst removed  CESAREAN SECTION    DILATION AND CURETTAGE OF UTERUS N/A 06/24/2019  Procedure: DILATATION AND CURETTAGE;  Surgeon: Lafonda Mosses, MD;  Location[facility];  Service: Gynecology;  Laterality: N/A;  FRACTURE SURGERY    right knee  INTRAUTERINE DEVICE (IUD) INSERTION N/A 06/24/2019  Procedure:  INTRAUTERINE DEVICE (IUD) INSERTION MIRENA;  Surgeon: Lafonda Mosses, MD;  Location: [facility];  Service: Gynecology;  Laterality: N/A;  IR ANGIO INTRA EXTRACRAN SEL COM CAROTID INNOMINATE UNI R MOD SED  11/05/2021  IR ANGIO VERTEBRAL SEL VERTEBRAL UNI R MOD SED  11/05/2021  IR ANGIOGRAM PULMONARY BILATERAL SELECTIVE  01/28/2022  IR ANGIOGRAM SELECTIVE EACH ADDITIONAL VESSEL  01/28/2022  IR ANGIOGRAM SELECTIVE EACH ADDITIONAL VESSEL  01/28/2022  IR CT HEAD LTD  11/01/2021  IR INTRA CRAN STENT  11/01/2021  IR RADIOLOGIST EVAL & MGMT  12/19/2021  IR THROMBECT PRIM MECH INIT (INCLU) MOD SED  01/28/2022  IR THROMBECT PRIM MECH INIT (INCLU) MOD SED  01/28/2022  IR US GUIDE VASC ACCESS RIGHT  11/01/2021  IR US GUIDE VASC ACCESS RIGHT  01/28/2022  IRRIGATION AND DEBRIDEMENT SEBACEOUS CYST    JOINT REPLACEMENT Right   right  LYMPH NODE DISSECTION N/A 09/14/2019  Procedure: LYMPH NODE DISSECTION;  Surgeon: Lafonda Mosses, MD;  Location: [facility];  Service: Gynecology;  Laterality: N/A;  RADIOLOGY WITH ANESTHESIA N/A 11/01/2021  Procedure: ANGIOPLASTY/STENT;  Surgeon: Luanne Bras, MD;  Location: [place];  Service: Radiology;  Laterality: N/A;  RADIOLOGY WITH ANESTHESIA N/A 01/28/2022  Procedure: IR WITH ANESTHESIA;  Surgeon: Radiologist, Medication, MD;  Location: [facility];  Service: Radiology;  Laterality: N/A;  ROBOTIC ASSISTED TOTAL HYSTERECTOMY WITH BILATERAL SALPINGO OOPHERECTOMY Bilateral 09/14/2019  Procedure: XI ROBOTIC ASSISTED TOTAL HYSTERECTOMY WITH BILATERAL SALPINGO OOPHORECTOMY;  Surgeon: Lafonda Mosses, MD;  Location: [facility];  Service: Gynecology;  Laterality: Bilateral;  SENTINEL NODE BIOPSY N/A 09/14/2019  Procedure: SENTINEL NODE BIOPSY;  Surgeon: Lafonda Mosses, MD;  Location: [facility];  Service: Gynecology;  Laterality: N/A;  teeth extration    TONSILLECTOMY    TOTAL HIP ARTHROPLASTY Right 02/23/2015  Procedure: RIGHT TOTAL HIP ARTHROPLASTY ANTERIOR APPROACH;  Surgeon: Leandrew Koyanagi, MD;   Location: [place];  Service: Orthopedics;  Laterality: Right;  TOTAL KNEE ARTHROPLASTY Left 06/27/2016  Procedure: LEFT TOTAL KNEE ARTHROPLASTY;  Surgeon: Leandrew Koyanagi, MD;  Location: [place];  Service: Orthopedics;  Laterality: Left; HPI: AERILYN SLEE is a 81 y.o. female with medical history significant for early stage uterine cancer, hypertension, type 2 diabetes, hypothyroidism, GERD, CKD 3B, chronic anxiety/depression, osteoarthritis, CVA and meningioma with left-sided deficit and speech difficulty, who presented to [facility] ED due to confusion x 2 days. In the ED, work-up revealed CT head with stable meningioma measuring 5 cm and associated vasogenic edema and regional mass effect.  Underwent IR thrombectomy 10/9. FEES 10/2021 penetration thin and nectar, wet vocal quality throughout, D3/nectar recommended.  Subjective: cooperative but confused  Recommendations for follow up therapy are one component of a multi-disciplinary discharge planning process, led  by the attending physician.  Recommendations may be updated based on patient status, additional functional criteria and insurance authorization. Assessment / Plan / Recommendation   02/04/2022  10:43 AM Clinical Impressions Clinical Impression Pt presents with severe oropharyngeal dysphagia largely decreased timing of laryngeal closure/swallow initiation delay to the level of the valleculae, which did not improve with any consistency. During thin and nectar thick liquid trials bolus material spilled from the valleculae into the laryngeal vestibule prior to the initiation of the swallow. Silent aspiration (PAS 8) noted with thin liquids, and penetration (PAS 3/5) noted with nectar thick liquids prior to the swallow. Residue from thin/nectar thick liquids remained in laryngeal vestibule throughout study. Residue in the valleculae and pyriform sinuses present with trials of nectar and honey thick liquids, which increased in amount and was noted in the lateral channels with  puree administration. Prolonged oral transit also present with puree adminsitration. Instances of penetration displayed with honey thick liquids via cup (PAS 2). Throughout study pt did not sense bolus material in airway and was cognitively unable to follow commands to cough/throat clear. Anatomically, pt presents with a cricopharyngeal bar. Recommend pt start honey thick liquids via teaspoon only/puree consistency diet, medications crushed in puree. MD consulted and agreed with diet recommendations given aspiration risk and may consult Palliative care. SLP will follow for diet toleration and education. SLP Visit Diagnosis Dysphagia, oropharyngeal phase (R13.12) Impact on safety and function Severe aspiration risk     02/04/2022  10:43 AM Treatment Recommendations Treatment Recommendations Therapy as outlined in treatment plan below     02/04/2022  10:43 AM Prognosis Prognosis for Safe Diet Advancement Fair Barriers to Reach Goals Severity of deficits;Cognitive deficits   02/04/2022  10:43 AM Diet Recommendations SLP Diet Recommendations Honey thick liquids;Dysphagia 1 (Puree) solids;Other (Comment) Liquid Administration via Spoon Medication Administration Whole meds with puree Compensations Slow rate;Small sips/bites;Minimize environmental distractions Postural Changes Seated upright at 90 degrees     02/04/2022  10:43 AM Other Recommendations Oral Care Recommendations Oral care BID Follow Up Recommendations Long-term institutional care without follow-up therapy Assistance recommended at discharge Frequent or constant Supervision/Assistance Functional Status Assessment Patient has had a recent decline in their functional status and demonstrates the ability to make significant improvements in function in a reasonable and predictable amount of time.   02/04/2022  10:43 AM Frequency and Duration  Speech Therapy Frequency (ACUTE ONLY) min 2x/week Treatment Duration 2 weeks     02/04/2022  10:43 AM Oral Phase Oral Phase  Impaired Oral - Honey Teaspoon WFL Oral - Honey Cup WFL Oral - Nectar Teaspoon NT Oral - Nectar Cup WFL Oral - Nectar Straw NT Oral - Thin Teaspoon NT Oral - Thin Cup WFL Oral - Thin Straw NT Oral - Puree Delayed oral transit;Decreased bolus cohesion Oral - Mech Soft NT Oral - Regular NT Oral - Multi-Consistency NT Oral - Pill NT    02/04/2022  10:43 AM Pharyngeal Phase Pharyngeal Phase Impaired Pharyngeal- Honey Teaspoon Delayed swallow initiation-vallecula;Reduced pharyngeal peristalsis;Penetration/Aspiration during swallow;Pharyngeal residue - pyriform;Pharyngeal residue - valleculae Pharyngeal Material enters airway, remains ABOVE vocal cords and not ejected out Pharyngeal- Honey Cup Delayed swallow initiation-vallecula;Delayed swallow initiation-pyriform sinuses;Penetration/Aspiration during swallow;Pharyngeal residue - pyriform;Pharyngeal residue - valleculae Pharyngeal Material enters airway, remains ABOVE vocal cords and not ejected out Pharyngeal- Nectar Teaspoon NT Pharyngeal- Nectar Cup Delayed swallow initiation-vallecula;Penetration/Aspiration before swallow;Pharyngeal residue - pyriform;Pharyngeal residue - valleculae Pharyngeal Material enters airway, CONTACTS cords and not ejected out;Material enters airway, remains ABOVE vocal cords and not  ejected out Pharyngeal- Nectar Straw NT Pharyngeal- Thin Teaspoon NT Pharyngeal- Thin Cup Trace aspiration;Penetration/Aspiration before swallow;Delayed swallow initiation-vallecula Pharyngeal Material enters airway, passes BELOW cords without attempt by patient to eject out (silent aspiration) Pharyngeal- Thin Straw NT Pharyngeal- Puree Delayed swallow initiation-vallecula;Pharyngeal residue - valleculae;Pharyngeal residue - pyriform;Lateral channel residue Pharyngeal Material does not enter airway Pharyngeal- Mechanical Soft NT Pharyngeal- Regular NT Pharyngeal- Multi-consistency NT Pharyngeal- Pill NT    02/04/2022  10:43 AM Cervical Esophageal Phase  Cervical  Esophageal Phase -- [name] 02/04/2022, 11:43 AM                      Scheduled Meds:  apixaban  10 mg Oral BID   Followed by   Derrill Memo ON 02/08/2022] apixaban  5 mg Oral BID   Chlorhexidine Gluconate Cloth  6 each Topical Q0600   docusate  100 mg Oral BID   feeding supplement (PROSource TF20)  60 mL Per Tube Daily   furosemide  40 mg Intravenous Daily   insulin aspart  0-15 Units Subcutaneous Q4H   insulin aspart  0-5 Units Subcutaneous QHS   insulin glargine-yfgn  10 Units Subcutaneous QHS   metoprolol tartrate  5 mg Intravenous Q6H   mouth rinse  15 mL Mouth Rinse 4 times per day   pantoprazole  40 mg Oral Daily   polyethylene glycol  17 g Oral BID   rosuvastatin  20 mg Oral Daily   senna  2 tablet Oral Daily   sodium chloride flush  10-40 mL Intracatheter Q12H   Continuous Infusions:  sodium chloride 10 mL/hr at 02/01/22 1626   sodium chloride 50 mL/hr at 02/04/22 2315   feeding supplement (OSMOLITE 1.5 CAL) 1,000 mL (02/02/22 1517)   meropenem (MERREM) IV 1 g (02/05/22 1221)     LOS: 10 days   Darliss Cheney, MD [facility]  02/05/2022, 12:36 PM   *Please note that this is a verbal dictation therefore any spelling or grammatical errors are due to the "[employer] One" system interpretation.  Please page via [place] and do not message via secure chat for urgent patient care matters. Secure chat can be used for non urgent patient care matters.  How to contact the [facility] Attending or Consulting provider [place] or covering provider during after hours [place], for this patient?  Check the care team in [facility] and look for a) attending/consulting TRH provider listed and b) the [facility] team listed. Page or secure chat [phone]. Log into [email or web address] and use [place]'s universal password to access. If you do not have the password, please contact the hospital operator. Locate the [facility] provider you are looking for under [facility] and page to a number that you can  be directly reached. If you still have difficulty reaching the provider, please page the [facility] (Director on Call) for the Hospitalists listed on amion for assistance.

## 2022-02-05 NOTE — Plan of Care (Signed)
  Problem: Education: Goal: Knowledge of General Education information will improve Description: Including pain rating scale, medication(s)/side effects and non-pharmacologic comfort measures Outcome: Progressing   Problem: Clinical Measurements: Goal: Will remain free from infection Outcome: Progressing   Problem: Clinical Measurements: Goal: Respiratory complications will improve Outcome: Progressing   Problem: Clinical Measurements: Goal: Cardiovascular complication will be avoided Outcome: Progressing   Problem: Activity: Goal: Risk for activity intolerance will decrease Outcome: Progressing   Problem: Nutrition: Goal: Adequate nutrition will be maintained Outcome: Progressing

## 2022-02-05 NOTE — Progress Notes (Signed)
Nutrition Follow-up  DOCUMENTATION CODES:   Non-severe (moderate) malnutrition in context of chronic illness  INTERVENTION:  Encourage adequate PO intake Monitor diet tolerance Consider alternate nutrition access if unable to maintain adequate PO intake  NUTRITION DIAGNOSIS:   Moderate Malnutrition related to chronic illness (meningioma) as evidenced by mild fat depletion, moderate muscle depletion.  Ongoing  GOAL:   Patient will meet greater than or equal to 90% of their needs  Goal unmet  MONITOR:   Diet advancement, Labs, Weight trends, TF tolerance, Skin, I & O's  REASON FOR ASSESSMENT:   Ventilator, Consult Enteral/tube feeding initiation and management  ASSESSMENT:   81 yo female admitted with massive pulmonary embolism with acute respiratory failure. PMH includes multiple CVAs, meningioma, endometrial cancer, HTN, DM, hypothyroidism, neuropathy.  10/09 - s/p IR thrombectomy 10/12 - extubated 10/13 - Cortrak placed, TF started 10/16 - s/p MBS- diet adv to dysphagia 1, honey thick  Pt pulled Cortrak tube 10/15. Unsuccessful attempt made by nursing to place NG tube. Noted recommendation for PEG tube placement but her son wants to hold off to see how her PO intake does now that she is on a dysphagia 1 diet.   PMT following. Plans for SNF placement at d/c.   Pt is a limited historian. No family present at bedside. Discussed PO intake with RN. At time of visit she had consumed 100% of her waffle and pudding. RN messaged RD later-pt had consumed 100% of pineapple, applesauce and a few sips of Hormel honey thick milk. At this time, no documented lunch meal completion in chart to review.   Edema: moderate generalized edema, deep pitting RUE, moderate pitting LUE and BLE  Medications: colace, lasix, SSI 0-15 units q4h, SSI 0-5 units qhs, semglee 10 units qhs, protonix, miralax, senna  Labs: CBG's 99-164 x24 hours  Diet Order:   Diet Order             DIET - DYS 1  Fluid consistency: Honey Thick  Diet effective now                   EDUCATION NEEDS:   Not appropriate for education at this time  Skin:  Skin Assessment: Skin Integrity Issues: Skin Integrity Issues:: DTI, Stage I DTI: R buttocks Stage I: buttocks, L heel  Last BM:  no documented BM  Height:   Ht Readings from Last 1 Encounters:  01/26/22 '5\' 3"'$  (1.6 m)    Weight:   Wt Readings from Last 1 Encounters:  02/01/22 84 kg    Ideal Body Weight:  52.3 kg  BMI:  Body mass index is 32.8 kg/m.  Estimated Nutritional Needs:   Kcal:  1700-1900  Protein:  85-100 grams  Fluid:  1.7-1.9 L  Clayborne Dana, RDN, LDN Clinical Nutrition

## 2022-02-05 NOTE — Progress Notes (Signed)
Physical Therapy Treatment Patient Details Name: Cassandra Dodson MRN: [id number] DOB: 1940/08/17 Today's Date: 02/05/2022   History of Present Illness 81 y.o. female admitted 01/26/22 with AMS, patient found to have a pulmonary embolism. Recent admit 9/23 with with UTI. 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    PT Comments    Patient progressing slowly towards PT goals. Session focused on standing trials and sitting balance. Requires Max A of 2 for standing from EOB with difficulty getting fully upright due to weakness. Noted to have edema in LUE with some weeping noted and minimal use of LUE. Tolerated there ex sitting EOB. Requires Min-Mod A for sitting balance, more assist needed dynamically. Pt requires repetition to follow commands and slow processing noted. Continues to be appropriate for SNF. Will follow.    Recommendations for follow up therapy are one component of a multi-disciplinary discharge planning process, led by the attending physician.  Recommendations may be updated based on patient status, additional functional criteria and insurance authorization.  Follow Up Recommendations  Skilled nursing-short term rehab (<3 hours/day) Can patient physically be transported by private vehicle: No   Assistance Recommended at Discharge Frequent or constant Supervision/Assistance  Patient can return home with the following Two people to help with walking and/or transfers;Two people to help with bathing/dressing/bathroom;Assist for transportation;Assistance with cooking/housework;Direct supervision/assist for medications management;Help with stairs or ramp for entrance   Equipment Recommendations  None recommended by PT    Recommendations for Other Services       Precautions / Restrictions Precautions Precautions: Fall;Other (comment) Precaution  Comments: LUE residual weakness due to hx of CVAs Restrictions Weight Bearing Restrictions: No     Mobility  Bed Mobility Overal bed mobility: Needs Assistance Bed Mobility: Supine to Sit, Sit to Supine, Rolling Rolling: +2 for physical assistance, Min assist   Supine to sit: Max assist, +2 for physical assistance Sit to supine: Max assist, +2 for physical assistance   General bed mobility comments: Pt provided with VC to utilize bed rail and bring BLE off EOB in order to sit. Manual assist provided to bring BLE off EOB and bring trunk off elevated HOB. Increased time. Did not use LUE to assist.    Transfers Overall transfer level: Needs assistance Equipment used: 2 person hand held assist Transfers: Sit to/from Stand Sit to Stand: Max assist, Total assist, +2 physical assistance, From elevated surface           General transfer comment: Max A 2 to stand from EOB x3 from elevated bed height with use of momentum, pads and belt. Left knee instability, right knee flexion requiring blocking of left knee for first 2 attempts. Able to get 3/4 of way up but difficulty getting fully upright.    Ambulation/Gait               General Gait Details: Unable   Stairs             Wheelchair Mobility    Modified Rankin (Stroke Patients Only)       Balance Overall balance assessment: Needs assistance Sitting-balance support: Feet supported, Single extremity supported Sitting balance-Leahy Scale: Poor Sitting balance - Comments: sitting on EOB varying assist needed, favors right lateral lean and needing Min-Mod A for balance. Worked on leaning on left elbow to find midline. Needs assist for dynamic tasks. Postural control: Right lateral lean, Posterior  lean Standing balance support: During functional activity Standing balance-Leahy Scale: Zero Standing balance comment: Max A of 2 for standing trials.                            Cognition Arousal/Alertness:  Awake/alert Behavior During Therapy: Flat affect Overall Cognitive Status: Impaired/Different from baseline Area of Impairment: Orientation, Attention, Following commands, Memory, Safety/judgement, Awareness, Problem solving                 Orientation Level: Disoriented to, Place, Time, Situation   Memory: Decreased short-term memory Following Commands: Follows one step commands inconsistently Safety/Judgement: Decreased awareness of safety, Decreased awareness of deficits Awareness: Intellectual Problem Solving: Slow processing, Decreased initiation, Requires verbal cues, Requires tactile cues, Difficulty sequencing General Comments: Initially follows simple commands with increased time, slow processing, needs repetition HOH?        Exercises General Exercises - Lower Extremity Long Arc Quad: AROM, Both, 5 reps, Seated    General Comments        Pertinent Vitals/Pain Pain Assessment Pain Assessment: Faces Faces Pain Scale: Hurts a little bit Pain Location: LUE when mobilized Pain Descriptors / Indicators: Discomfort, Grimacing Pain Intervention(s): Monitored during session, Limited activity within patient's tolerance, Repositioned    Home Living                          Prior Function            PT Goals (current goals can now be found in the care plan section) Progress towards PT goals: Progressing toward goals (slowly)    Frequency    Min 2X/week      PT Plan Current plan remains appropriate    Co-evaluation PT/OT/SLP Co-Evaluation/Treatment: Yes Reason for Co-Treatment: Complexity of the patient's impairments (multi-system involvement);To address functional/ADL transfers PT goals addressed during session: Mobility/safety with mobility;Balance;Strengthening/ROM OT goals addressed during session: Strengthening/ROM;ADL's and self-care      AM-PAC PT "6 Clicks" Mobility   Outcome Measure  Help needed turning from your back to your side  while in a flat bed without using bedrails?: A Lot Help needed moving from lying on your back to sitting on the side of a flat bed without using bedrails?: Total Help needed moving to and from a bed to a chair (including a wheelchair)?: Total Help needed standing up from a chair using your arms (e.g., wheelchair or bedside chair)?: Total Help needed to walk in hospital room?: Total Help needed climbing 3-5 steps with a railing? : Total 6 Click Score: 7    End of Session Equipment Utilized During Treatment: Gait belt Activity Tolerance: Patient tolerated treatment well Patient left: in bed;with call bell/phone within reach;with bed alarm set Nurse Communication: Mobility status;Need for lift equipment PT Visit Diagnosis: Other abnormalities of gait and mobility (R26.89);Other symptoms and signs involving the nervous system (R29.898)     Time: 2409-7353 PT Time Calculation (min) (ACUTE ONLY): 23 min  Charges:  $Therapeutic Activity: 8-22 mins                     Marisa Severin, PT, DPT Acute Rehabilitation Services Secure chat preferred Office [phone]      Marguarite Arbour A Shantese Raven 02/05/2022, 11:43 AM

## 2022-02-05 NOTE — Progress Notes (Signed)
Occupational Therapy Treatment Patient Details Name: Cassandra Dodson MRN: [id number] DOB: [birth year] Today's Date: 02/05/2022   History of present illness 81 y.o. female admitted 01/26/22 with AMS, patient found to have a pulmonary embolism. Recent admit 9/23 with with UTI. 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   OT comments  Pt in bed upon therapy arrival and agreeable to participate in therapy treatment. OT and PT co-tx completed due to high level of physical assist required during functional mobility and BADL tasks. Pt continues to require increased physical assist and tactile and verbal cues to initiate functional tasks. Increased edema noted in LUE with pain noted and limited use. Pt able to complete 3 trials of sit<> stands at EOB. No device used. Pt continues to benefit from skilled OT services to focus on generalized weakness and decreased activity tolerance. OT will continue to follow patient acutely.    Recommendations for follow up therapy are one component of a multi-disciplinary discharge planning process, led by the attending physician.  Recommendations may be updated based on patient status, additional functional criteria and insurance authorization.    Follow Up Recommendations  Skilled nursing-short term rehab (<3 hours/day)    Assistance Recommended at Discharge Frequent or constant Supervision/Assistance  Patient can return home with the following  Assistance with feeding;Direct supervision/assist for medications management;Direct supervision/assist for financial management;Assist for transportation;Help with stairs or ramp for entrance;Two people to help with bathing/dressing/bathroom;Assistance with cooking/housework;Two people to help with walking and/or transfers   Equipment Recommendations  None recommended by OT       Precautions  / Restrictions Precautions Precautions: Fall Precaution Comments: LUE residual weakness due to hx of CVAs Restrictions Weight Bearing Restrictions: No       Mobility Bed Mobility Overal bed mobility: Needs Assistance Bed Mobility: Supine to Sit, Sit to Supine Rolling: +2 for physical assistance, Min assist   Supine to sit: Mod assist, +2 for physical assistance Sit to supine: Mod assist, +2 for physical assistance   General bed mobility comments: Pt provided with VC to utilize bed rail and bring BLE off EOB in order to sit. Manual assist provided to bring BLE off EOB and bring trunk off elevated HOB. Required intermittent Min-mod assist to maintain sitting balance. Patient Response: Flat affect, Cooperative  Transfers Overall transfer level: Needs assistance Equipment used: None, 2 person hand held assist Transfers: Sit to/from Stand       General transfer comment: Completed sit to stand x3 trials with +2 max assist. Momentum used while rocking prior to lift off. VC for sequencing provided. VC to bring trunk upright and look out window in attempts to achieve upright standing posture.     Balance Overall balance assessment: Needs assistance Sitting-balance support: Single extremity supported, Feet supported Sitting balance-Leahy Scale: Poor Sitting balance - Comments: sitting on EOB Postural control: Posterior lean, Right lateral lean Standing balance support: During functional activity, Single extremity supported Standing balance-Leahy Scale: Zero Standing balance comment: during sit to stand trials       ADL either performed or assessed with clinical judgement    Extremity/Trunk Assessment Upper Extremity Assessment LUE Deficits / Details: Significant edema noted in LUE from hand to upper arm. Dimpling, weeping, and pitting noted. Limited joint mobility passively and actively. Minimal active ROM/movement noted during session. Weak gross grasp although not functional.  Cognition Arousal/Alertness: Awake/alert Behavior During Therapy: WFL for tasks assessed/performed, Flat affect Overall Cognitive Status: Impaired/Different from baseline                       Pertinent Vitals/ Pain       Pain Assessment Pain Assessment: Faces Faces Pain Scale: Hurts a little bit Pain Location: LUE when mobilized Pain Descriptors / Indicators: Discomfort, Grimacing Pain Intervention(s): Monitored during session, Limited activity within patient's tolerance, Repositioned         Frequency  Min 2X/week        Progress Toward Goals  OT Goals(current goals can now be found in the care plan section)  Progress towards OT goals: Progressing toward goals     Plan Discharge plan remains appropriate;Frequency remains appropriate    Co-evaluation    PT/OT/SLP Co-Evaluation/Treatment: Yes Reason for Co-Treatment: Complexity of the patient's impairments (multi-system involvement);To address functional/ADL transfers   OT goals addressed during session: Strengthening/ROM;ADL's and self-care      AM-PAC OT "6 Clicks" Daily Activity     Outcome Measure   Help from another person eating meals?: Total Help from another person taking care of personal grooming?: Total Help from another person toileting, which includes using toliet, bedpan, or urinal?: Total Help from another person bathing (including washing, rinsing, drying)?: Total Help from another person to put on and taking off regular upper body clothing?: Total Help from another person to put on and taking off regular lower body clothing?: Total 6 Click Score: 6    End of Session Equipment Utilized During Treatment: Gait belt  OT Visit Diagnosis: Unsteadiness on feet (R26.81);Other abnormalities of gait and mobility (R26.89);Muscle weakness (generalized) (M62.81);Other symptoms and signs involving the nervous system (R29.898);Other symptoms and signs involving cognitive  function;Pain Pain - Right/Left: Left Pain - part of body: Arm   Activity Tolerance Patient tolerated treatment well   Patient Left in bed;with call bell/phone within reach;with bed alarm set   Nurse Communication          Time: 6256-3893 OT Time Calculation (min): 30 min  Charges: OT General Charges $OT Visit: 1 Visit OT Treatments $Therapeutic Activity: 8-22 mins  Ailene Ravel, OTR/L,CBIS  Supplemental OT - [facility] and [facility]   Dail Meece, Clarene Duke 02/05/2022, 11:32 AM

## 2022-02-05 NOTE — Plan of Care (Signed)
  Problem: Coping: Goal: Level of anxiety will decrease Outcome: Progressing   Problem: Pain Managment: Goal: General experience of comfort will improve Outcome: Progressing   Problem: Safety: Goal: Ability to remain free from injury will improve Outcome: Progressing   

## 2022-02-05 NOTE — TOC Progression Note (Addendum)
Transition of Care [facility]) - Progression Note    Patient Details  Name: Cassandra Dodson MRN: [id number] Date of Birth: May 16, 1940  [facility] CM/SW Contact  [name], LCSW Phone Number: 02/05/2022, 11:09 AM  Clinical Narrative:   CSW spoke with son [name] regarding bed offers.  He would like to accept offer from [facility].  CSW confirmed with [facility].    Expected Discharge Plan: [place] Barriers to Discharge: Continued Medical Work up, SNF Pending bed offer  Expected Discharge Plan and Services Expected Discharge Plan: [place] In-house Referral: Clinical Social Work   Post Acute Care Choice: [place] Living arrangements for the past 2 months: Single Family Home                                       Social Determinants of Health (SDOH) Interventions    Readmission Risk Interventions     No data to display

## 2022-02-05 NOTE — Progress Notes (Signed)
Speech Language Pathology Treatment: Dysphagia  Patient Details Name: Cassandra Dodson MRN: [id number] DOB: Aug 31, 1940 Today's Date: 02/05/2022 Time: 9798-9211 SLP Time Calculation (min) (ACUTE ONLY): 17 min  Assessment / Plan / Recommendation Clinical Impression  Therapist assisted pt with breakfast meal following MBS yesterday. Soundly sleeping but able to easily awaken with verbal and tactile cueing for puree texture and honey thick liquids. Wet vocal quality noted at baseline continued throughout session as well as delayed cough increasing at end of meal. She was able to follow strategy when cued to clear her throat but effort was weak and wet quality soon returned indicative of likely airway intrusion as seen during MBS. Recommend she continue puree (Dys 1) texture, honey thick liquids via spoon, crush meds and cue for volitional throat clear. Continue ST.     HPI HPI: Cassandra Dodson is a 81 y.o. female with medical history significant for early stage uterine cancer, hypertension, type 2 diabetes, hypothyroidism, GERD, CKD 3B, chronic anxiety/depression, osteoarthritis, CVA and meningioma with left-sided deficit and speech difficulty, who presented to [facility] ED due to confusion x 2 days. In the ED, work-up revealed CT head with stable meningioma measuring 5 cm and associated vasogenic edema and regional mass effect.  Underwent IR thrombectomy 10/9. FEES 10/2021 penetration thin and nectar, wet vocal quality throughout, D3/nectar recommended.      SLP Plan  Continue with current plan of care      Recommendations for follow up therapy are one component of a multi-disciplinary discharge planning process, led by the attending physician.  Recommendations may be updated based on patient status, additional functional criteria and insurance authorization.    Recommendations  Diet recommendations: Dysphagia 1 (puree);Honey-thick liquid Liquids provided via: Teaspoon Medication Administration: Crushed with  puree Supervision: Full supervision/cueing for compensatory strategies Compensations: Slow rate;Small sips/bites;Minimize environmental distractions Postural Changes and/or Swallow Maneuvers: Seated upright 90 degrees                Oral Care Recommendations: Oral care BID Follow Up Recommendations: Skilled nursing-short term rehab (<3 hours/day) Assistance recommended at discharge: Frequent or constant Supervision/Assistance SLP Visit Diagnosis: Dysphagia, oropharyngeal phase (R13.12) Plan: Continue with current plan of care           [name]  02/05/2022, 9:41 AM Orbie Pyo Colvin Caroli.Ed Product/process development scientist [phone]

## 2022-02-06 DIAGNOSIS — I2602 Saddle embolus of pulmonary artery with acute cor pulmonale: Secondary | ICD-10-CM | POA: Diagnosis not present

## 2022-02-06 LAB — CBC WITH DIFFERENTIAL/PLATELET
Abs Immature Granulocytes: 0.06 10*3/uL (ref 0.00–0.07)
Basophils Absolute: 0 10*3/uL (ref 0.0–0.1)
Basophils Relative: 0 %
Eosinophils Absolute: 0.1 10*3/uL (ref 0.0–0.5)
Eosinophils Relative: 1 %
HCT: 26.5 % — ABNORMAL LOW (ref 36.0–46.0)
Hemoglobin: 8.2 g/dL — ABNORMAL LOW (ref 12.0–15.0)
Immature Granulocytes: 1 %
Lymphocytes Relative: 12 %
Lymphs Abs: 1.1 10*3/uL (ref 0.7–4.0)
MCH: 29.7 pg (ref 26.0–34.0)
MCHC: 30.9 g/dL (ref 30.0–36.0)
MCV: 96 fL (ref 80.0–100.0)
Monocytes Absolute: 0.7 10*3/uL (ref 0.1–1.0)
Monocytes Relative: 8 %
Neutro Abs: 7.2 10*3/uL (ref 1.7–7.7)
Neutrophils Relative %: 78 %
Platelets: 199 10*3/uL (ref 150–400)
RBC: 2.76 MIL/uL — ABNORMAL LOW (ref 3.87–5.11)
RDW: 17.1 % — ABNORMAL HIGH (ref 11.5–15.5)
WBC: 9.2 10*3/uL (ref 4.0–10.5)
nRBC: 0 % (ref 0.0–0.2)

## 2022-02-06 LAB — GLUCOSE, CAPILLARY
Glucose-Capillary: 107 mg/dL — ABNORMAL HIGH (ref 70–99)
Glucose-Capillary: 86 mg/dL (ref 70–99)
Glucose-Capillary: 103 mg/dL — ABNORMAL HIGH (ref 70–99)
Glucose-Capillary: 139 mg/dL — ABNORMAL HIGH (ref 70–99)
Glucose-Capillary: 158 mg/dL — ABNORMAL HIGH (ref 70–99)
Glucose-Capillary: 143 mg/dL — ABNORMAL HIGH (ref 70–99)

## 2022-02-06 MED ORDER — METOPROLOL TARTRATE 25 MG PO TABS
25.0000 mg | ORAL_TABLET | Freq: Two times a day (BID) | ORAL | Status: DC
  Administered 2022-02-06 – 2022-02-16 (×17): 25 mg via ORAL
  Filled 2022-02-06 (×20): qty 1

## 2022-02-06 NOTE — Progress Notes (Signed)
PROGRESS NOTE    Cassandra Dodson  QZE:[id number] DOB: [birth year] DOA: 01/26/2022 PCP: Shon Baton, MD   Brief Narrative:  81 year old female with past medical history of meningioma and multiple CVAs most recently late 12/2021 admitted for altered mental status.  Patient found to have a pulmonary embolism.  Given altered mental status, patient required to be intubated.  Patient had low blood pressures requiring Levophed.  Patient was started on Lovenox for PE and admitted to ICU for further work-up.  During hospital course, patient started bruising very badly, and hemoglobin started to drop after initiation of Lovenox, and anticoagulation was stopped.  Patient then had a mechanical thrombectomy once stable to retrieve clot.  Patient required 2 units of blood, and patient's hemoglobin remained stable afterwards.  Heparin was initiated, and patient's hemoglobin remains stable.  Patient was eventually weaned off ventilator back to room air.  Patient's blood pressures remained stable and no longer requiring pressors.  Patient is currently requiring antibiotics for pneumonia. Patient's platelets during hospitalization have started to drop, and HIT antibodies have been ordered.  Patient was transitioned from heparin to Eliquis after core track is placed.  PT OT recommends SNF.  Patient was transferred to [facility] on 02/02/2022.  Assessment & Plan:   Principal Problem:   Pulmonary embolism with acute cor pulmonale (HCC) Active Problems:   Brain mass   CVA (cerebral vascular accident) ([place])   Pressure injury of skin   Malnutrition of moderate degree  Acute metabolic encephalopathy, likely secondary to saddle massive pulmonary embolism and severe sepsis, resolving Patient extubated.  Patient is status post mechanical thrombectomy 01/28/2022.  Patient is off of all pressors and maintaining blood pressure on her own.  She is sleepy but oriented to self and place.  Per son, this is her new baseline since August since  she was diagnosed with meningioma.   Hypokalemia: Resolved.  #Acute hypoxic respiratory failure secondary to PE and Klebsiella pneumoniae: Improving.  Afebrile.  Very mild leukocytosis.  She is on room air.  Continue meropenem for 7 days, will complete last dose tonight.  Has history of ESBL.  Continue bronchodilators.   #Blood loss anemia: Likely secondary to anticoagulation.  Patient's hemoglobin suddenly dropped from 8.5-7.3 on 01/29/2022 and she received 2 units of PRBC transfusion.  Anticoagulation was stopped briefly.  It has been resumed ever since.  Hemoglobin has remained stable between 8 and 9 ever since.  Monitor closely.  Rechecking CBC today.  #Thrombocytopenia: Stable.  No signs of bleeding.  HIT antibodies negative.  Continue Eliquis.  Rechecking CBC today.  #Type 2 diabetes mellitus: Blood sugar controlled.  Continue 10 years of Lantus.  SSI.   #Meningioma Stable at this time.  No intervention needed at this time.  Follow-up with neurosurgery as outpatient.   #Stage I sacral decubitus ulcer and heel ulcer #Present on admission Stable -Continue wound care   #Elevated liver enzymes, resolving   Dysphagia/poor p.o. intake: She pulled her cortrak 02/03/2022.  The nurse was unsuccessful to place NG tube.   She was seen by speech and they have started her on dysphagia 1 diet.  SLP initially had said that she is likely going to require PEG tube.  I had a very lengthy discussion with patient's son and he wants to wait for 2 to 3 days to see if his mother is going to eat by herself enough to avoid PEG tube.  He believes that she is improving.  Reportedly, she is eating more food now.  Hopefully she will not need PEG tube.  I was informed during progression today that cortrak will be placed back again today.  Disposition: On 02/04/2022, discussed with the son in length that as long as she has cortrak in place, she will not be able to discharge to SNF.  He was very adamant that he would  like to keep his mother in the hospital and not let her go to SNF until she is able to eat by herself and get some therapy.  He said that " me and my mother are fighters, I am ready to fight with whoever I have to, anybody in the hospital or the insurance company to keep my mother in the hospital as long as she needs to because I am afraid that if she were to go home, she will develop other problems and might not make it and I want her to be safe and I want her tumor to be removed from her surgeon after she recovers from this as well" I consulted palliative care to help.  Please refer to their note from yesterday.  DVT prophylaxis:   Eliquis   Code Status: Full Code  Family Communication:  None present at bedside.  Plan of care discussed with the son over the phone yesterday.  Status is: Inpatient Remains inpatient appropriate because: Patient with dysphagia and require SNF.  Cannot be discharged until feeding tube is out or PEG tube is placed but son wants to give her some time.   Estimated body mass index is 32.8 kg/m as calculated from the following:   Height as of this encounter: '5\' 3"'$  (1.6 m).   Weight as of this encounter: 84 kg.  Pressure Injury 01/26/22 Buttocks Mid Stage 1 -  Intact skin with non-blanchable redness of a localized area usually over a bony prominence. (Active)  01/26/22 1549  Location: Buttocks  Location Orientation: Mid  Staging: Stage 1 -  Intact skin with non-blanchable redness of a localized area usually over a bony prominence.  Wound Description (Comments):   Present on Admission: Yes  Dressing Type Foam - Lift dressing to assess site every shift 02/05/22 1938     Pressure Injury 01/26/22 Heel Left Stage 1 -  Intact skin with non-blanchable redness of a localized area usually over a bony prominence. (Active)  01/26/22 1500  Location: Heel  Location Orientation: Left  Staging: Stage 1 -  Intact skin with non-blanchable redness of a localized area usually over a  bony prominence.  Wound Description (Comments):   Present on Admission: Yes  Dressing Type Foam - Lift dressing to assess site every shift 02/05/22 1142     Pressure Injury 01/28/22 Buttocks Right Deep Tissue Pressure Injury - Purple or maroon localized area of discolored intact skin or blood-filled blister due to damage of underlying soft tissue from pressure and/or shear. (Active)  01/28/22 1642  Location: Buttocks  Location Orientation: Right  Staging: Deep Tissue Pressure Injury - Purple or maroon localized area of discolored intact skin or blood-filled blister due to damage of underlying soft tissue from pressure and/or shear.  Wound Description (Comments):   Present on Admission:   Dressing Type Foam - Lift dressing to assess site every shift 02/05/22 1142   Nutritional Assessment: Body mass index is 32.8 kg/m.Marland Kitchen Seen by dietician.  I agree with the assessment and plan as outlined below: Nutrition Status: Nutrition Problem: Moderate Malnutrition Etiology: chronic illness (meningioma) Signs/Symptoms: mild fat depletion, moderate muscle depletion Interventions: Tube feeding  .  Skin Assessment: I have examined the patient's skin and I agree with the wound assessment as performed by the wound care RN as outlined below: Pressure Injury 01/26/22 Buttocks Mid Stage 1 -  Intact skin with non-blanchable redness of a localized area usually over a bony prominence. (Active)  01/26/22 1549  Location: Buttocks  Location Orientation: Mid  Staging: Stage 1 -  Intact skin with non-blanchable redness of a localized area usually over a bony prominence.  Wound Description (Comments):   Present on Admission: Yes  Dressing Type Foam - Lift dressing to assess site every shift 02/05/22 1938     Pressure Injury 01/26/22 Heel Left Stage 1 -  Intact skin with non-blanchable redness of a localized area usually over a bony prominence. (Active)  01/26/22 1500  Location: Heel  Location Orientation: Left   Staging: Stage 1 -  Intact skin with non-blanchable redness of a localized area usually over a bony prominence.  Wound Description (Comments):   Present on Admission: Yes  Dressing Type Foam - Lift dressing to assess site every shift 02/05/22 1142     Pressure Injury 01/28/22 Buttocks Right Deep Tissue Pressure Injury - Purple or maroon localized area of discolored intact skin or blood-filled blister due to damage of underlying soft tissue from pressure and/or shear. (Active)  01/28/22 1642  Location: Buttocks  Location Orientation: Right  Staging: Deep Tissue Pressure Injury - Purple or maroon localized area of discolored intact skin or blood-filled blister due to damage of underlying soft tissue from pressure and/or shear.  Wound Description (Comments):   Present on Admission:   Dressing Type Foam - Lift dressing to assess site every shift 02/05/22 1142    Consultants:  IR  Procedures:  As above  Antimicrobials:  Anti-infectives (From admission, onward)    Start     Dose/Rate Route Frequency Ordered Stop   01/31/22 2100  meropenem (MERREM) 1 g in sodium chloride 0.9 % 100 mL IVPB        1 g 200 mL/hr over 30 Minutes Intravenous Every 8 hours 01/31/22 1656 02/07/22 2359   01/31/22 1445  ceFEPIme (MAXIPIME) 2 g in sodium chloride 0.9 % 100 mL IVPB  Status:  Discontinued        2 g 200 mL/hr over 30 Minutes Intravenous Every 12 hours 01/31/22 1359 01/31/22 1656   01/27/22 2200  ceFEPIme (MAXIPIME) 2 g in sodium chloride 0.9 % 100 mL IVPB  Status:  Discontinued        2 g 200 mL/hr over 30 Minutes Intravenous Every 12 hours 01/27/22 1410 01/29/22 1200   01/26/22 2000  ceFEPIme (MAXIPIME) 2 g in sodium chloride 0.9 % 100 mL IVPB  Status:  Discontinued        2 g 200 mL/hr over 30 Minutes Intravenous Every 8 hours 01/26/22 1316 01/27/22 1410   01/26/22 1030  ceFEPIme (MAXIPIME) 2 g in sodium chloride 0.9 % 100 mL IVPB        2 g 200 mL/hr over 30 Minutes Intravenous  Once  01/26/22 1015 01/26/22 1317   01/26/22 1030  vancomycin (VANCOREADY) IVPB 2000 mg/400 mL  Status:  Discontinued        2,000 mg 200 mL/hr over 120 Minutes Intravenous  Once 01/26/22 1015 01/26/22 1629         Subjective:  Patient seen and examined.  Patient sleepy but arousable.  Oriented to self and place.  Has no complaints.  Objective: Vitals:   02/05/22 1938 02/05/22 2233  02/06/22 0300 02/06/22 0741  BP:  (!) 115/55 125/77 (!) 145/64  Pulse:  89 69 78  Resp: '20 20 17 19  '$ Temp:  98 F (36.7 C) 98.1 F (36.7 C) 98.3 F (36.8 C)  TempSrc:  Oral Oral Axillary  SpO2:  92% 97% 100%  Weight:      Height:        Intake/Output Summary (Last 24 hours) at 02/06/2022 1006 Last data filed at 02/06/2022 0741 Gross per 24 hour  Intake --  Output 500 ml  Net -500 ml    Filed Weights   01/29/22 0453 01/31/22 0500 02/01/22 0500  Weight: 84 kg 84 kg 84 kg    Examination:  General exam: Appears calm and comfortable  Respiratory system: Clear to auscultation. Respiratory effort normal. Cardiovascular system: S1 & S2 heard, RRR. No JVD, murmurs, rubs, gallops or clicks. No pedal edema. Gastrointestinal system: Abdomen is nondistended, soft and nontender. No organomegaly or masses felt. Normal bowel sounds heard. Central nervous system: Sleepy but oriented x2.  This is her baseline.  No focal deficit. Extremities: Symmetric 5 x 5 power.   Data Reviewed: I have personally reviewed following labs and imaging studies  CBC: Recent Labs  Lab 01/31/22 0326 02/01/22 0458 02/02/22 0307 02/03/22 0249 02/04/22 0401  WBC 11.0* 10.4 11.5* 10.2 8.1  HGB 8.5* 8.8* 8.6* 8.5* 8.1*  HCT 25.5* 26.6* 27.2* 26.8* 26.0*  MCV 88.5 90.5 93.8 92.7 94.9  PLT 108* 106* 141* 147* 141*    Basic Metabolic Panel: Recent Labs  Lab 01/31/22 0326 02/01/22 0458 02/02/22 0307 02/03/22 0249  NA 136 134* 136 138  K 4.7 3.8 3.4* 3.8  CL 101 99 100 104  CO2 '26 26 28 27  '$ GLUCOSE 185* 118* 265*  141*  BUN 31* 24* 29* 23  CREATININE 0.62 0.65 0.61 0.52  CALCIUM 8.3* 8.5* 9.2 9.3    GFR: Estimated Creatinine Clearance: 55.6 mL/min (by C-G formula based on SCr of 0.52 mg/dL). Liver Function Tests: Recent Labs  Lab 01/31/22 0326 02/01/22 0458 02/02/22 0307  AST 37 30 25  ALT 113* 82* 64*  ALKPHOS 62 53 59  BILITOT 0.6 0.9 0.6  PROT 4.5* 4.9* 5.0*  ALBUMIN 2.2* 2.3* 2.2*    No results for input(s): "LIPASE", "AMYLASE" in the last 168 hours. No results for input(s): "AMMONIA" in the last 168 hours. Coagulation Profile: No results for input(s): "INR", "PROTIME" in the last 168 hours. Cardiac Enzymes: No results for input(s): "CKTOTAL", "CKMB", "CKMBINDEX", "TROPONINI" in the last 168 hours. BNP (last 3 results) No results for input(s): "PROBNP" in the last 8760 hours. HbA1C: No results for input(s): "HGBA1C" in the last 72 hours. CBG: Recent Labs  Lab 02/05/22 1125 02/05/22 1620 02/05/22 2012 02/06/22 0359 02/06/22 0740  GLUCAP 114* 164* 135* 107* 86    Lipid Profile: No results for input(s): "CHOL", "HDL", "LDLCALC", "TRIG", "CHOLHDL", "LDLDIRECT" in the last 72 hours. Thyroid Function Tests: No results for input(s): "TSH", "T4TOTAL", "FREET4", "T3FREE", "THYROIDAB" in the last 72 hours. Anemia Panel: No results for input(s): "VITAMINB12", "FOLATE", "FERRITIN", "TIBC", "IRON", "RETICCTPCT" in the last 72 hours. Sepsis Labs: Recent Labs  Lab 01/30/22 1115  PROCALCITON 0.31     Recent Results (from the past 240 hour(s))  Expectorated Sputum Assessment w Gram Stain, Rflx to Resp Cult     Status: None   Collection Time: 01/30/22  9:49 AM   Specimen: Expectorated Sputum  Result Value Ref Range Status   Specimen Description EXPECTORATED  SPUTUM  Final   Special Requests NONE  Final   Sputum evaluation   Final    THIS SPECIMEN IS ACCEPTABLE FOR SPUTUM CULTURE Performed at [facility], [address] [place], [place] [address]    Report Status  02/01/2022 FINAL  Final  Culture, Respiratory w Gram Stain     Status: None   Collection Time: 01/30/22  9:49 AM  Result Value Ref Range Status   Specimen Description EXPECTORATED SPUTUM  Final   Special Requests NONE Reflexed from [id number]  Final   Gram Stain   Final    MODERATE WBC PRESENT,BOTH PMN AND MONONUCLEAR FEW GRAM POSITIVE COCCI IN CLUSTERS FEW GRAM NEGATIVE RODS FEW YEAST WITH PSEUDOHYPHAE Performed at [facility], [place] [address] [place], [place] [address]    Culture   Final    MODERATE KLEBSIELLA PNEUMONIAE Confirmed Extended Spectrum Beta-Lactamase Producer (ESBL).  In bloodstream infections from ESBL organisms, carbapenems are preferred over piperacillin/tazobactam. They are shown to have a lower risk of mortality.    Report Status 02/01/2022 FINAL  Final   Organism ID, Bacteria KLEBSIELLA PNEUMONIAE  Final      Susceptibility   Klebsiella pneumoniae - MIC*    AMPICILLIN >=32 RESISTANT Resistant     CEFAZOLIN >=64 RESISTANT Resistant     CEFEPIME >=32 RESISTANT Resistant     CEFTAZIDIME RESISTANT Resistant     CEFTRIAXONE >=64 RESISTANT Resistant     CIPROFLOXACIN <=0.25 SENSITIVE Sensitive     GENTAMICIN <=1 SENSITIVE Sensitive     IMIPENEM <=0.25 SENSITIVE Sensitive     TRIMETH/SULFA >=320 RESISTANT Resistant     AMPICILLIN/SULBACTAM >=32 RESISTANT Resistant     PIP/TAZO 16 SENSITIVE Sensitive     * MODERATE KLEBSIELLA PNEUMONIAE     Radiology Studies: DG Swallowing Func-Speech Pathology  Result Date: 02/04/2022 Table formatting from the original result was not included. Objective Swallowing Evaluation: Type of Study: MBS-Modified Barium Swallow Study  Patient Details Name: Cassandra Dodson MRN: [id number] Date of Birth: Mar 01, 1941 Today's Date: 02/04/2022 Time: SLP Start Time (ACUTE ONLY): 40 -SLP Stop Time (ACUTE ONLY): 6195 SLP Time Calculation (min) (ACUTE ONLY): 18 min Past Medical History: Past Medical History: Diagnosis Date  Arthritis   CKD (chronic  kidney disease), stage III ([place])   patient denies  Depression   Diabetes mellitus without complication ([place])   Difficult intravenous access   Endometrial cancer ([place])   GERD (gastroesophageal reflux disease)   Headache   History of radiation therapy 11/04/2019-12/01/2019  Endometrial HDR; Dr. Gery Pray  Hypertension   Hypothyroidism   Neuromuscular disorder ([place])   neuropathy in feet  Osteoarthritis   PMB (postmenopausal bleeding)   PONV (postoperative nausea and vomiting)   severe nausea and vomiting after knee replacement 06-2014, did ok with 2018 knee replacement  Urinary frequency   Wears glasses  Past Surgical History: Past Surgical History: Procedure Laterality Date  BREAST SURGERY    cyst removed  CESAREAN SECTION    DILATION AND CURETTAGE OF UTERUS N/A 06/24/2019  Procedure: DILATATION AND CURETTAGE;  Surgeon: Lafonda Mosses, MD;  Location: [place];  Service: Gynecology;  Laterality: N/A;  FRACTURE SURGERY    right knee  INTRAUTERINE DEVICE (IUD) INSERTION N/A 06/24/2019  Procedure: INTRAUTERINE DEVICE (IUD) INSERTION MIRENA;  Surgeon: Lafonda Mosses, MD;  Location: [facility];  Service: Gynecology;  Laterality: N/A;  IR ANGIO INTRA EXTRACRAN SEL COM CAROTID INNOMINATE UNI R MOD SED  11/05/2021  IR  ANGIO VERTEBRAL SEL VERTEBRAL UNI R MOD SED  11/05/2021  IR ANGIOGRAM PULMONARY BILATERAL SELECTIVE  01/28/2022  IR ANGIOGRAM SELECTIVE EACH ADDITIONAL VESSEL  01/28/2022  IR ANGIOGRAM SELECTIVE EACH ADDITIONAL VESSEL  01/28/2022  IR CT HEAD LTD  11/01/2021  IR INTRA CRAN STENT  11/01/2021  IR RADIOLOGIST EVAL & MGMT  12/19/2021  IR THROMBECT PRIM MECH INIT (INCLU) MOD SED  01/28/2022  IR THROMBECT PRIM MECH INIT (INCLU) MOD SED  01/28/2022  IR US GUIDE VASC ACCESS RIGHT  11/01/2021  IR US GUIDE VASC ACCESS RIGHT  01/28/2022  IRRIGATION AND DEBRIDEMENT SEBACEOUS CYST    JOINT REPLACEMENT Right   right  LYMPH NODE DISSECTION N/A 09/14/2019  Procedure: LYMPH NODE DISSECTION;  Surgeon:  Lafonda Mosses, MD;  Location: [facility];  Service: Gynecology;  Laterality: N/A;  RADIOLOGY WITH ANESTHESIA N/A 11/01/2021  Procedure: ANGIOPLASTY/STENT;  Surgeon: Luanne Bras, MD;  Location: [place];  Service: Radiology;  Laterality: N/A;  RADIOLOGY WITH ANESTHESIA N/A 01/28/2022  Procedure: IR WITH ANESTHESIA;  Surgeon: Radiologist, Medication, MD;  Location: [place];  Service: Radiology;  Laterality: N/A;  ROBOTIC ASSISTED TOTAL HYSTERECTOMY WITH BILATERAL SALPINGO OOPHERECTOMY Bilateral 09/14/2019  Procedure: XI ROBOTIC ASSISTED TOTAL HYSTERECTOMY WITH BILATERAL SALPINGO OOPHORECTOMY;  Surgeon: Lafonda Mosses, MD;  Location: [facility];  Service: Gynecology;  Laterality: Bilateral;  SENTINEL NODE BIOPSY N/A 09/14/2019  Procedure: SENTINEL NODE BIOPSY;  Surgeon: Lafonda Mosses, MD;  Location: [facility];  Service: Gynecology;  Laterality: N/A;  teeth extration    TONSILLECTOMY    TOTAL HIP ARTHROPLASTY Right 02/23/2015  Procedure: RIGHT TOTAL HIP ARTHROPLASTY ANTERIOR APPROACH;  Surgeon: Leandrew Koyanagi, MD;  Location: [place];  Service: Orthopedics;  Laterality: Right;  TOTAL KNEE ARTHROPLASTY Left 06/27/2016  Procedure: LEFT TOTAL KNEE ARTHROPLASTY;  Surgeon: Leandrew Koyanagi, MD;  Location: [place];  Service: Orthopedics;  Laterality: Left; HPI: Cassandra Dodson is a 81 y.o. female with medical history significant for early stage uterine cancer, hypertension, type 2 diabetes, hypothyroidism, GERD, CKD 3B, chronic anxiety/depression, osteoarthritis, CVA and meningioma with left-sided deficit and speech difficulty, who presented to [facility] ED due to confusion x 2 days. In the ED, work-up revealed CT head with stable meningioma measuring 5 cm and associated vasogenic edema and regional mass effect.  Underwent IR thrombectomy 10/9. FEES 10/2021 penetration thin and nectar, wet vocal quality throughout, D3/nectar recommended.  Subjective: cooperative but confused  Recommendations for follow up therapy are one component of a  multi-disciplinary discharge planning process, led by the attending physician.  Recommendations may be updated based on patient status, additional functional criteria and insurance authorization. Assessment / Plan / Recommendation   02/04/2022  10:43 AM Clinical Impressions Clinical Impression Pt presents with severe oropharyngeal dysphagia largely decreased timing of laryngeal closure/swallow initiation delay to the level of the valleculae, which did not improve with any consistency. During thin and nectar thick liquid trials bolus material spilled from the valleculae into the laryngeal vestibule prior to the initiation of the swallow. Silent aspiration (PAS 8) noted with thin liquids, and penetration (PAS 3/5) noted with nectar thick liquids prior to the swallow. Residue from thin/nectar thick liquids remained in laryngeal vestibule throughout study. Residue in the valleculae and pyriform sinuses present with trials of nectar and honey thick liquids, which increased in amount and was noted in the lateral channels with puree administration. Prolonged oral transit also present with puree adminsitration. Instances of penetration displayed with honey thick liquids via cup (PAS 2). Throughout  study pt did not sense bolus material in airway and was cognitively unable to follow commands to cough/throat clear. Anatomically, pt presents with a cricopharyngeal bar. Recommend pt start honey thick liquids via teaspoon only/puree consistency diet, medications crushed in puree. MD consulted and agreed with diet recommendations given aspiration risk and may consult Palliative care. SLP will follow for diet toleration and education. SLP Visit Diagnosis Dysphagia, oropharyngeal phase (R13.12) Impact on safety and function Severe aspiration risk     02/04/2022  10:43 AM Treatment Recommendations Treatment Recommendations Therapy as outlined in treatment plan below     02/04/2022  10:43 AM Prognosis Prognosis for Safe Diet  Advancement Fair Barriers to Reach Goals Severity of deficits;Cognitive deficits   02/04/2022  10:43 AM Diet Recommendations SLP Diet Recommendations Honey thick liquids;Dysphagia 1 (Puree) solids;Other (Comment) Liquid Administration via Spoon Medication Administration Whole meds with puree Compensations Slow rate;Small sips/bites;Minimize environmental distractions Postural Changes Seated upright at 90 degrees     02/04/2022  10:43 AM Other Recommendations Oral Care Recommendations Oral care BID Follow Up Recommendations Long-term institutional care without follow-up therapy Assistance recommended at discharge Frequent or constant Supervision/Assistance Functional Status Assessment Patient has had a recent decline in their functional status and demonstrates the ability to make significant improvements in function in a reasonable and predictable amount of time.   02/04/2022  10:43 AM Frequency and Duration  Speech Therapy Frequency (ACUTE ONLY) min 2x/week Treatment Duration 2 weeks     02/04/2022  10:43 AM Oral Phase Oral Phase Impaired Oral - Honey Teaspoon WFL Oral - Honey Cup WFL Oral - Nectar Teaspoon NT Oral - Nectar Cup WFL Oral - Nectar Straw NT Oral - Thin Teaspoon NT Oral - Thin Cup WFL Oral - Thin Straw NT Oral - Puree Delayed oral transit;Decreased bolus cohesion Oral - Mech Soft NT Oral - Regular NT Oral - Multi-Consistency NT Oral - Pill NT    02/04/2022  10:43 AM Pharyngeal Phase Pharyngeal Phase Impaired Pharyngeal- Honey Teaspoon Delayed swallow initiation-vallecula;Reduced pharyngeal peristalsis;Penetration/Aspiration during swallow;Pharyngeal residue - pyriform;Pharyngeal residue - valleculae Pharyngeal Material enters airway, remains ABOVE vocal cords and not ejected out Pharyngeal- Honey Cup Delayed swallow initiation-vallecula;Delayed swallow initiation-pyriform sinuses;Penetration/Aspiration during swallow;Pharyngeal residue - pyriform;Pharyngeal residue - valleculae Pharyngeal Material  enters airway, remains ABOVE vocal cords and not ejected out Pharyngeal- Nectar Teaspoon NT Pharyngeal- Nectar Cup Delayed swallow initiation-vallecula;Penetration/Aspiration before swallow;Pharyngeal residue - pyriform;Pharyngeal residue - valleculae Pharyngeal Material enters airway, CONTACTS cords and not ejected out;Material enters airway, remains ABOVE vocal cords and not ejected out Pharyngeal- Nectar Straw NT Pharyngeal- Thin Teaspoon NT Pharyngeal- Thin Cup Trace aspiration;Penetration/Aspiration before swallow;Delayed swallow initiation-vallecula Pharyngeal Material enters airway, passes BELOW cords without attempt by patient to eject out (silent aspiration) Pharyngeal- Thin Straw NT Pharyngeal- Puree Delayed swallow initiation-vallecula;Pharyngeal residue - valleculae;Pharyngeal residue - pyriform;Lateral channel residue Pharyngeal Material does not enter airway Pharyngeal- Mechanical Soft NT Pharyngeal- Regular NT Pharyngeal- Multi-consistency NT Pharyngeal- Pill NT    02/04/2022  10:43 AM Cervical Esophageal Phase  Cervical Esophageal Phase -- [name] 02/04/2022, 11:43 AM                      Scheduled Meds:  apixaban  10 mg Oral BID   Followed by   Derrill Memo ON 02/08/2022] apixaban  5 mg Oral BID   Chlorhexidine Gluconate Cloth  6 each Topical Q0600   docusate  100 mg Oral BID   feeding supplement (PROSource TF20)  60 mL Per Tube Daily  furosemide  40 mg Intravenous Daily   insulin aspart  0-15 Units Subcutaneous Q4H   insulin aspart  0-5 Units Subcutaneous QHS   insulin glargine-yfgn  10 Units Subcutaneous QHS   metoprolol tartrate  5 mg Intravenous Q6H   mouth rinse  15 mL Mouth Rinse 4 times per day   pantoprazole  40 mg Oral Daily   polyethylene glycol  17 g Oral BID   rosuvastatin  20 mg Oral Daily   senna  2 tablet Oral Daily   sodium chloride flush  10-40 mL Intracatheter Q12H   Continuous Infusions:  sodium chloride 10 mL/hr at 02/01/22 1626   sodium chloride 50  mL/hr at 02/04/22 2315   feeding supplement (OSMOLITE 1.5 CAL) 1,000 mL (02/02/22 1517)   meropenem (MERREM) IV 1 g (02/06/22 0343)     LOS: 11 days   Darliss Cheney, MD [facility]  02/06/2022, 10:06 AM   *Please note that this is a verbal dictation therefore any spelling or grammatical errors are due to the "[employer] One" system interpretation.  Please page via [place] and do not message via secure chat for urgent patient care matters. Secure chat can be used for non urgent patient care matters.  How to contact the [facility] Attending or Consulting provider [place] or covering provider during after hours [place], for this patient?  Check the care team in [facility] and look for a) attending/consulting TR[facility] provider listed and b) the [facility] team listed. Page or secure chat [phone]. Log into [email or web address] and use [place]'s universal password to access. If you do not have the password, please contact the hospital operator. Locate the [facility] provider you are looking for under [facility] and page to a number that you can be directly reached. If you still have difficulty reaching the provider, please page the [facility] (Director on Call) for the Hospitalists listed on amion for assistance.

## 2022-02-07 ENCOUNTER — Other Ambulatory Visit: Payer: Self-pay

## 2022-02-07 ENCOUNTER — Encounter (HOSPITAL_COMMUNITY): Payer: Self-pay | Admitting: Pulmonary Disease

## 2022-02-07 DIAGNOSIS — I2602 Saddle embolus of pulmonary artery with acute cor pulmonale: Secondary | ICD-10-CM | POA: Diagnosis not present

## 2022-02-07 LAB — GLUCOSE, CAPILLARY
Glucose-Capillary: 125 mg/dL — ABNORMAL HIGH (ref 70–99)
Glucose-Capillary: 93 mg/dL (ref 70–99)
Glucose-Capillary: 138 mg/dL — ABNORMAL HIGH (ref 70–99)
Glucose-Capillary: 106 mg/dL — ABNORMAL HIGH (ref 70–99)
Glucose-Capillary: 140 mg/dL — ABNORMAL HIGH (ref 70–99)

## 2022-02-07 NOTE — Plan of Care (Signed)
  Problem: Nutrition: Goal: Adequate nutrition will be maintained Outcome: Progressing   Problem: Coping: Goal: Level of anxiety will decrease Outcome: Progressing   

## 2022-02-07 NOTE — Plan of Care (Signed)
  Problem: Nutrition: Goal: Adequate nutrition will be maintained Outcome: Progressing   

## 2022-02-07 NOTE — Progress Notes (Signed)
Occupational Therapy Treatment Patient Details Name: Cassandra Dodson MRN: [id number] DOB: Aug 02, 1940 Today's Date: 02/07/2022   History of present illness 81 y.o. female admitted 01/26/22 with AMS, patient found to have a pulmonary embolism. Recent admit 9/23 with with UTI. 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   OT comments  Pt in bed upon therapy arrival with Student Nurse at bedside. Pt agreeable to participate in OT treatment session. Pt completed self-feeding during breakfast with focus on activity participation, coordination, awareness, cognition including direction following and orientation. Pt required primarily total assist for self-feeding although did complete self-feeding with eggs and cream of wheat with assist from OT to follow safety precautions due to Dysphagia 1 diet recommendations set by SLP. OT will continue to follow acutely.    Recommendations for follow up therapy are one component of a multi-disciplinary discharge planning process, led by the attending physician.  Recommendations may be updated based on patient status, additional functional criteria and insurance authorization.    Follow Up Recommendations  Skilled nursing-short term rehab (<3 hours/day)    Assistance Recommended at Discharge Frequent or constant Supervision/Assistance  Patient can return home with the following  Assistance with feeding;Direct supervision/assist for medications management;Direct supervision/assist for financial management;Assist for transportation;Help with stairs or ramp for entrance;Two people to help with bathing/dressing/bathroom;Assistance with cooking/housework;Two people to help with walking and/or transfers   Equipment Recommendations  None recommended by OT       Precautions / Restrictions Precautions Precautions: Fall;Other  (comment) Precaution Comments: LUE residual weakness due to hx of CVAs Restrictions Weight Bearing Restrictions: No       Mobility Bed Mobility Overal bed mobility: Needs Assistance Bed Mobility: Rolling Rolling: +2 for physical assistance, Min assist                     ADL either performed or assessed with clinical judgement   ADL Overall ADL's : Needs assistance/impaired Eating/Feeding: Maximal assistance;Bed level Eating/Feeding Details (indicate cue type and reason): HOB elevated to allow for seated upright posture. 90% of meal OT provided Total A for self feeding. Pt did complete self feeding for Cream of Wheat and Eggs. Min A for self feeding eggs as patient required set-up of food on spoon due to decreased coordination and awareness of appropriate amount of food/bite portion. Min A provided for self feeding cream of wheat with therapist holding bowling to allow for pt to access and to adjust portion size on spoon. OT provided VC with visual demonstration to clear throat periodically during meal.        Extremity/Trunk Assessment Upper Extremity Assessment LUE Deficits / Details: Edema continues to be present in LUE although visually decreased since last OT treatment session. LUE positioned on pillow during session to assist with decreasing Edema.             Cognition Arousal/Alertness: Awake/alert Behavior During Therapy: Flat affect Overall Cognitive Status: Impaired/Different from baseline Area of Impairment: Following commands, Awareness, Attention                 Orientation Level: Disoriented to, Time, Situation             General Comments: Able to state name and that she is currently in a hospital                   Pertinent Vitals/  Pain       Pain Assessment Pain Assessment: Faces Faces Pain Scale: Hurts even more Pain Location: LUE and L knee when mobilized Pain Descriptors / Indicators: Discomfort, Grimacing Pain  Intervention(s): Monitored during session, Repositioned         Frequency  Min 2X/week        Progress Toward Goals  OT Goals(current goals can now be found in the care plan section)  Progress towards OT goals: Progressing toward goals     Plan Discharge plan remains appropriate;Frequency remains appropriate       AM-PAC OT "6 Clicks" Daily Activity     Outcome Measure   Help from another person eating meals?: Total Help from another person taking care of personal grooming?: Total Help from another person toileting, which includes using toliet, bedpan, or urinal?: Total Help from another person bathing (including washing, rinsing, drying)?: Total Help from another person to put on and taking off regular upper body clothing?: Total Help from another person to put on and taking off regular lower body clothing?: Total 6 Click Score: 6    End of Session    OT Visit Diagnosis: Unsteadiness on feet (R26.81);Other abnormalities of gait and mobility (R26.89);Muscle weakness (generalized) (M62.81);Other symptoms and signs involving the nervous system (R29.898);Other symptoms and signs involving cognitive function;Pain Pain - Right/Left: Left Pain - part of body: Arm;Knee   Activity Tolerance Patient tolerated treatment well   Patient Left in bed;with call bell/phone within reach;with nursing/sitter in room;with bed alarm set   Nurse Communication          Time: 0920-1000 OT Time Calculation (min): 40 min  Charges: OT General Charges $OT Visit: 1 Visit OT Treatments $Self Care/Home Management : 38-52 mins  Ailene Ravel, OTR/L,CBIS  Supplemental OT - [facility] and [facility]   Amarii Amy, Clarene Duke 02/07/2022, 10:15 AM

## 2022-02-07 NOTE — Progress Notes (Addendum)
Progress Note    Cassandra Dodson  YTK:[id number] DOB: [birth year]  DOA: 01/26/2022 PCP: Shon Baton, MD      Brief Narrative:    Medical records reviewed and are as summarized below:  Cassandra Dodson is a 81 y.o. female with past medical history of meningioma and multiple CVAs most recently late 12/2021 admitted for altered mental status.  Patient found to have a pulmonary embolism.  Given altered mental status, patient required to be intubated.  Patient had low blood pressures requiring Levophed.  Patient was started on Lovenox for PE and admitted to ICU for further work-up.  During hospital course, patient started bruising very badly, and hemoglobin started to drop after initiation of Lovenox, and anticoagulation was stopped.  Patient then had a mechanical thrombectomy once stable to retrieve clot.  Patient required 2 units of blood, and patient's hemoglobin remained stable afterwards.  Heparin was initiated, and patient's hemoglobin remains stable.  Patient was eventually weaned off ventilator back to room air.  Patient's blood pressures remained stable and no longer requiring pressors.  Patient is currently requiring antibiotics for pneumonia. Patient's platelets during hospitalization have started to drop, and HIT antibodies have been ordered.  Patient was transitioned from heparin to Eliquis after core track is placed.  PT OT recommends SNF.  Patient was [facility] to [facility] on 02/02/2022.     No notes on file      Assessment/Plan:   Principal Problem:   Pulmonary embolism with acute cor pulmonale (HCC) Active Problems:   Brain mass   CVA (cerebral vascular accident) ([place])   Pressure injury of skin   Malnutrition of moderate degree   Nutrition Problem: Moderate Malnutrition Etiology: chronic illness (meningioma)  Signs/Symptoms: mild fat depletion, moderate muscle depletion   Body mass index is 32.8 kg/m.    Acute metabolic encephalopathy, likely secondary to saddle  massive pulmonary embolism and severe sepsis, resolving Patient extubated.  Patient is status post mechanical thrombectomy 01/28/2022.  She is off pressors since she seems to be acting her baseline (oriented to self and place).  Reportedly patient has had some disorientation issues since she was diagnosed with a meningioma in August 2023.   Hypokalemia: Resolved.   #Acute hypoxic respiratory failure secondary to PE and Klebsiella pneumoniae: Resolved.  She is tolerating room air.  Discontinue meropenem (completed 7 days of treatment).  Continue bronchodilators as needed.    #Blood loss anemia: Likely secondary to anticoagulation.  Patient's hemoglobin suddenly dropped from 8.5-7.3 on 01/29/2022 and she received 2 units of PRBC transfusion.  Anticoagulation was stopped briefly.  It has been resumed ever since.  Hemoglobin has remained stable between 8 and 9 ever since.  H&H is stable.  #Thrombocytopenia: Stable.  No signs of bleeding.  HIT antibodies negative.  Continue Eliquis.   #Type 2 diabetes mellitus: Blood sugar controlled.  Continue 10 units of Semglee.  SSI.   #Meningioma Stable at this time.  No intervention needed at this time.  Follow-up with neurosurgery as outpatient.   #Stage I sacral decubitus ulcer and heel ulcer #Present on admission Continue local wound care   #Elevated liver enzymes: Improving    Dysphagia/poor p.o. intake: Oral intake is slowly improving.  She was also started with feeding by occupational therapist today.  If she continues to improve then she will not need for PEG tube.    Diet Order             DIET - DYS 1 Fluid consistency:  Honey Thick  Diet effective now                            Consultants: Interventional radiologist  Procedures: Bilateral pulmonary artery thrombectomy on  01/28/2022 Cortrak NG tube placed on 02/01/2022    Medications:    apixaban  10 mg Oral BID   Followed by   Derrill Memo ON 02/08/2022] apixaban  5  mg Oral BID   Chlorhexidine Gluconate Cloth  6 each Topical Q0600   docusate  100 mg Oral BID   feeding supplement (PROSource TF20)  60 mL Per Tube Daily   insulin aspart  0-15 Units Subcutaneous Q4H   insulin aspart  0-5 Units Subcutaneous QHS   insulin glargine-yfgn  10 Units Subcutaneous QHS   metoprolol tartrate  25 mg Oral BID   mouth rinse  15 mL Mouth Rinse 4 times per day   pantoprazole  40 mg Oral Daily   polyethylene glycol  17 g Oral BID   rosuvastatin  20 mg Oral Daily   senna  2 tablet Oral Daily   sodium chloride flush  10-40 mL Intracatheter Q12H   Continuous Infusions:  sodium chloride 10 mL/hr at 02/01/22 1626   feeding supplement (OSMOLITE 1.5 CAL) 1,000 mL (02/02/22 1517)   meropenem (MERREM) IV 1 g (02/07/22 0545)     Anti-infectives (From admission, onward)    Start     Dose/Rate Route Frequency Ordered Stop   01/31/22 2100  meropenem (MERREM) 1 g in sodium chloride 0.9 % 100 mL IVPB        1 g 200 mL/hr over 30 Minutes Intravenous Every 8 hours 01/31/22 1656 02/07/22 2359   01/31/22 1445  ceFEPIme (MAXIPIME) 2 g in sodium chloride 0.9 % 100 mL IVPB  Status:  Discontinued        2 g 200 mL/hr over 30 Minutes Intravenous Every 12 hours 01/31/22 1359 01/31/22 1656   01/27/22 2200  ceFEPIme (MAXIPIME) 2 g in sodium chloride 0.9 % 100 mL IVPB  Status:  Discontinued        2 g 200 mL/hr over 30 Minutes Intravenous Every 12 hours 01/27/22 1410 01/29/22 1200   01/26/22 2000  ceFEPIme (MAXIPIME) 2 g in sodium chloride 0.9 % 100 mL IVPB  Status:  Discontinued        2 g 200 mL/hr over 30 Minutes Intravenous Every 8 hours 01/26/22 1316 01/27/22 1410   01/26/22 1030  ceFEPIme (MAXIPIME) 2 g in sodium chloride 0.9 % 100 mL IVPB        2 g 200 mL/hr over 30 Minutes Intravenous  Once 01/26/22 1015 01/26/22 1317   01/26/22 1030  vancomycin (VANCOREADY) IVPB 2000 mg/400 mL  Status:  Discontinued        2,000 mg 200 mL/hr over 120 Minutes Intravenous  Once 01/26/22 1015  01/26/22 1629              Family Communication/Anticipated D/C date and plan/Code Status   DVT prophylaxis:  apixaban (ELIQUIS) tablet 10 mg  apixaban (ELIQUIS) tablet 5 mg     Code Status: Full Code  Family Communication: None Disposition Plan: Plan to discharge to SNF   Status is: Inpatient Remains inpatient appropriate because: Awaiting placement to SNF.       Subjective:   Interval events noted.  She said she feels okay.  She was being fed by the occupational therapist at the bedside.  She was determined  nurse was also at the bedside.  Objective:    Vitals:   02/06/22 1157 02/06/22 1919 02/07/22 0411 02/07/22 0800  BP: (!) 112/45 136/88 (!) 144/75 127/76  Pulse: 82 90 89 91  Resp:   18 16  Temp:  98 F (36.7 C) 97.8 F (36.6 C) 98.6 F (37 C)  TempSrc:  Oral Oral Oral  SpO2:  100% 100% 100%  Weight:      Height:       No data found.   Intake/Output Summary (Last 24 hours) at 02/07/2022 1337 Last data filed at 02/07/2022 1200 Gross per 24 hour  Intake 480 ml  Output 750 ml  Net -270 ml   Filed Weights   01/29/22 0453 01/31/22 0500 02/01/22 0500  Weight: 84 kg 84 kg 84 kg    Exam:  GEN: NAD SKIN: Warm and dry. EYES: No pallor or icterus ENT: MMM CV: RRR PULM: CTA B ABD: soft, obese, NT, +BS CNS: AAO x 2 person and  place), non focal EXT: Left arm and forearm swelling with bruise.  Mild tenderness over the left knee without swelling or erythema    Pressure Injury 01/26/22 Buttocks Mid Stage 1 -  Intact skin with non-blanchable redness of a localized area usually over a bony prominence. (Active)  01/26/22 1549  Location: Buttocks  Location Orientation: Mid  Staging: Stage 1 -  Intact skin with non-blanchable redness of a localized area usually over a bony prominence.  Wound Description (Comments):   Present on Admission: Yes  Dressing Type Foam - Lift dressing to assess site every shift 02/07/22 0044     Pressure Injury  01/26/22 Heel Left Stage 1 -  Intact skin with non-blanchable redness of a localized area usually over a bony prominence. (Active)  01/26/22 1500  Location: Heel  Location Orientation: Left  Staging: Stage 1 -  Intact skin with non-blanchable redness of a localized area usually over a bony prominence.  Wound Description (Comments):   Present on Admission: Yes  Dressing Type Foam - Lift dressing to assess site every shift 02/05/22 1142     Pressure Injury 01/28/22 Buttocks Right Deep Tissue Pressure Injury - Purple or maroon localized area of discolored intact skin or blood-filled blister due to damage of underlying soft tissue from pressure and/or shear. (Active)  01/28/22 1642  Location: Buttocks  Location Orientation: Right  Staging: Deep Tissue Pressure Injury - Purple or maroon localized area of discolored intact skin or blood-filled blister due to damage of underlying soft tissue from pressure and/or shear.  Wound Description (Comments):   Present on Admission:   Dressing Type Foam - Lift dressing to assess site every shift 02/05/22 1142     Data Reviewed:   I have personally reviewed following labs and imaging studies:  Labs: Labs show the following:   Basic Metabolic Panel: Recent Labs  Lab 02/01/22 0458 02/02/22 0307 02/03/22 0249  NA 134* 136 138  K 3.8 3.4* 3.8  CL 99 100 104  CO2 '26 28 27  '$ GLUCOSE 118* 265* 141*  BUN 24* 29* 23  CREATININE 0.65 0.61 0.52  CALCIUM 8.5* 9.2 9.3   GFR Estimated Creatinine Clearance: 55.6 mL/min (by C-G formula based on SCr of 0.52 mg/dL). Liver Function Tests: Recent Labs  Lab 02/01/22 0458 02/02/22 0307  AST 30 25  ALT 82* 64*  ALKPHOS 53 59  BILITOT 0.9 0.6  PROT 4.9* 5.0*  ALBUMIN 2.3* 2.2*   No results for input(s): "LIPASE", "AMYLASE" in  the last 168 hours. No results for input(s): "AMMONIA" in the last 168 hours. Coagulation profile No results for input(s): "INR", "PROTIME" in the last 168  hours.  CBC: Recent Labs  Lab 02/01/22 0458 02/02/22 0307 02/03/22 0249 02/04/22 0401 02/06/22 1902  WBC 10.4 11.5* 10.2 8.1 9.2  NEUTROABS  --   --   --   --  7.2  HGB 8.8* 8.6* 8.5* 8.1* 8.2*  HCT 26.6* 27.2* 26.8* 26.0* 26.5*  MCV 90.5 93.8 92.7 94.9 96.0  PLT 106* 141* 147* 141* 199   Cardiac Enzymes: No results for input(s): "CKTOTAL", "CKMB", "CKMBINDEX", "TROPONINI" in the last 168 hours. BNP (last 3 results) No results for input(s): "PROBNP" in the last 8760 hours. CBG: Recent Labs  Lab 02/06/22 2130 02/06/22 2322 02/07/22 0407 02/07/22 0811 02/07/22 1133  GLUCAP 158* 143* 125* 93 138*   D-Dimer: No results for input(s): "DDIMER" in the last 72 hours. Hgb A1c: No results for input(s): "HGBA1C" in the last 72 hours. Lipid Profile: No results for input(s): "CHOL", "HDL", "LDLCALC", "TRIG", "CHOLHDL", "LDLDIRECT" in the last 72 hours. Thyroid function studies: No results for input(s): "TSH", "T4TOTAL", "T3FREE", "THYROIDAB" in the last 72 hours.  Invalid input(s): "FREET3" Anemia work up: No results for input(s): "VITAMINB12", "FOLATE", "FERRITIN", "TIBC", "IRON", "RETICCTPCT" in the last 72 hours. Sepsis Labs: Recent Labs  Lab 02/02/22 0307 02/03/22 0249 02/04/22 0401 02/06/22 1902  WBC 11.5* 10.2 8.1 9.2    Microbiology Recent Results (from the past 240 hour(s))  Expectorated Sputum Assessment w Gram Stain, Rflx to Resp Cult     Status: None   Collection Time: 01/30/22  9:49 AM   Specimen: Expectorated Sputum  Result Value Ref Range Status   Specimen Description EXPECTORATED SPUTUM  Final   Special Requests NONE  Final   Sputum evaluation   Final    THIS SPECIMEN IS ACCEPTABLE FOR SPUTUM CULTURE Performed at [facility], [place] [address] [place], [place] [address]    Report Status 02/01/2022 FINAL  Final  Culture, Respiratory w Gram Stain     Status: None   Collection Time: 01/30/22  9:49 AM  Result Value Ref Range Status   Specimen  Description EXPECTORATED SPUTUM  Final   Special Requests NONE Reflexed from [id number]  Final   Gram Stain   Final    MODERATE WBC PRESENT,BOTH PMN AND MONONUCLEAR FEW GRAM POSITIVE COCCI IN CLUSTERS FEW GRAM NEGATIVE RODS FEW YEAST WITH PSEUDOHYPHAE Performed at [facility], [place] [address] [place], [place] [address]    Culture   Final    MODERATE KLEBSIELLA PNEUMONIAE Confirmed Extended Spectrum Beta-Lactamase Producer (ESBL).  In bloodstream infections from ESBL organisms, carbapenems are preferred over piperacillin/tazobactam. They are shown to have a lower risk of mortality.    Report Status 02/01/2022 FINAL  Final   Organism ID, Bacteria KLEBSIELLA PNEUMONIAE  Final      Susceptibility   Klebsiella pneumoniae - MIC*    AMPICILLIN >=32 RESISTANT Resistant     CEFAZOLIN >=64 RESISTANT Resistant     CEFEPIME >=32 RESISTANT Resistant     CEFTAZIDIME RESISTANT Resistant     CEFTRIAXONE >=64 RESISTANT Resistant     CIPROFLOXACIN <=0.25 SENSITIVE Sensitive     GENTAMICIN <=1 SENSITIVE Sensitive     IMIPENEM <=0.25 SENSITIVE Sensitive     TRIMETH/SULFA >=320 RESISTANT Resistant     AMPICILLIN/SULBACTAM >=32 RESISTANT Resistant     PIP/TAZO 16 SENSITIVE Sensitive     * MODERATE KLEBSIELLA  PNEUMONIAE    Procedures and diagnostic studies:  No results found.             LOS: 12 days   Vera Copywriter, advertising on [email or web address]. If 7PM-7AM, please contact night-coverage at [email or web address]     02/07/2022, 1:37 PM

## 2022-02-08 DIAGNOSIS — I2602 Saddle embolus of pulmonary artery with acute cor pulmonale: Secondary | ICD-10-CM | POA: Diagnosis not present

## 2022-02-08 LAB — COMPREHENSIVE METABOLIC PANEL
ALT: 20 U/L (ref 0–44)
AST: 14 U/L — ABNORMAL LOW (ref 15–41)
Albumin: 2.2 g/dL — ABNORMAL LOW (ref 3.5–5.0)
Alkaline Phosphatase: 65 U/L (ref 38–126)
Anion gap: 8 (ref 5–15)
BUN: 28 mg/dL — ABNORMAL HIGH (ref 8–23)
CO2: 27 mmol/L (ref 22–32)
Calcium: 9 mg/dL (ref 8.9–10.3)
Chloride: 108 mmol/L (ref 98–111)
Creatinine, Ser: 0.63 mg/dL (ref 0.44–1.00)
GFR, Estimated: >60 mL/min (ref >60)
Glucose, Bld: 184 mg/dL — ABNORMAL HIGH (ref 70–99)
Potassium: 3.2 mmol/L — ABNORMAL LOW (ref 3.5–5.1)
Sodium: 143 mmol/L (ref 135–145)
Total Bilirubin: 0.6 mg/dL (ref 0.3–1.2)
Total Protein: 5 g/dL — ABNORMAL LOW (ref 6.5–8.1)

## 2022-02-08 LAB — MAGNESIUM: Magnesium: 2 mg/dL (ref 1.7–2.4)

## 2022-02-08 LAB — CBC WITH DIFFERENTIAL/PLATELET
Abs Immature Granulocytes: 0.04 10*3/uL (ref 0.00–0.07)
Basophils Absolute: 0 10*3/uL (ref 0.0–0.1)
Basophils Relative: 0 %
Eosinophils Absolute: 0.2 10*3/uL (ref 0.0–0.5)
Eosinophils Relative: 2 %
HCT: 25.8 % — ABNORMAL LOW (ref 36.0–46.0)
Hemoglobin: 8 g/dL — ABNORMAL LOW (ref 12.0–15.0)
Immature Granulocytes: 0 %
Lymphocytes Relative: 12 %
Lymphs Abs: 1.1 10*3/uL (ref 0.7–4.0)
MCH: 29.9 pg (ref 26.0–34.0)
MCHC: 31 g/dL (ref 30.0–36.0)
MCV: 96.3 fL (ref 80.0–100.0)
Monocytes Absolute: 0.8 10*3/uL (ref 0.1–1.0)
Monocytes Relative: 8 %
Neutro Abs: 7 10*3/uL (ref 1.7–7.7)
Neutrophils Relative %: 78 %
Platelets: 210 10*3/uL (ref 150–400)
RBC: 2.68 MIL/uL — ABNORMAL LOW (ref 3.87–5.11)
RDW: 17.2 % — ABNORMAL HIGH (ref 11.5–15.5)
WBC: 9.1 10*3/uL (ref 4.0–10.5)
nRBC: 0 % (ref 0.0–0.2)

## 2022-02-08 LAB — GLUCOSE, CAPILLARY
Glucose-Capillary: 154 mg/dL — ABNORMAL HIGH (ref 70–99)
Glucose-Capillary: 170 mg/dL — ABNORMAL HIGH (ref 70–99)
Glucose-Capillary: 171 mg/dL — ABNORMAL HIGH (ref 70–99)
Glucose-Capillary: 89 mg/dL (ref 70–99)
Glucose-Capillary: 79 mg/dL (ref 70–99)
Glucose-Capillary: 139 mg/dL — ABNORMAL HIGH (ref 70–99)
Glucose-Capillary: 140 mg/dL — ABNORMAL HIGH (ref 70–99)

## 2022-02-08 MED ORDER — POTASSIUM CHLORIDE 20 MEQ PO PACK
40.0000 meq | PACK | Freq: Once | ORAL | Status: AC
  Administered 2022-02-08: 40 meq via ORAL
  Filled 2022-02-08: qty 2

## 2022-02-08 NOTE — Progress Notes (Signed)
Physical Therapy Treatment Patient Details Name: Cassandra Dodson MRN: [id number] DOB: 03/24/1941 Today's Date: 02/08/2022   History of Present Illness 81 y.o. female admitted 01/26/22 with AMS, patient found to have a pulmonary embolism. Recent admit 9/23 with with UTI. 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    PT Comments    Patient not progressing well towards mobility today. Seems uncomfortable and in pain esp with movement. Needing total A of 2 for bed mobility. Able to sit EOB and participate in some core and balance exercises with encouragement. Pt moaning "nooo" throughout session. Deferred standing trials today due to above. Reluctant to move LUE even with support. Difficulty following 1 step commands consistently and needs repetition. Recommend lift OOB for nursing. Continues to be appropriate for SNF. Will follow.    Recommendations for follow up therapy are one component of a multi-disciplinary discharge planning process, led by the attending physician.  Recommendations may be updated based on patient status, additional functional criteria and insurance authorization.  Follow Up Recommendations  Skilled nursing-short term rehab (<3 hours/day) Can patient physically be transported by private vehicle: No   Assistance Recommended at Discharge Frequent or constant Supervision/Assistance  Patient can return home with the following Two people to help with walking and/or transfers;Two people to help with bathing/dressing/bathroom;Assist for transportation;Assistance with cooking/housework;Direct supervision/assist for medications management;Help with stairs or ramp for entrance   Equipment Recommendations  None recommended by PT    Recommendations for Other Services       Precautions / Restrictions Precautions Precautions: Fall;Other  (comment) Precaution Comments: LUE residual weakness due to hx of CVAs Restrictions Weight Bearing Restrictions: No     Mobility  Bed Mobility Overal bed mobility: Needs Assistance Bed Mobility: Supine to Sit     Supine to sit: Total assist, +2 for physical assistance Sit to supine: Total assist, +2 for physical assistance   General bed mobility comments: Total A of 2 using helicoptor techinque to get to EOB as well as to return to supine. UNable to assist.    Transfers                   General transfer comment: Deferred as pt uncomfortable and moaning "no no" with movement.    Ambulation/Gait                   Stairs             Wheelchair Mobility    Modified Rankin (Stroke Patients Only)       Balance Overall balance assessment: Needs assistance Sitting-balance support: Feet supported, Bilateral upper extremity supported Sitting balance-Leahy Scale: Poor Sitting balance - Comments: Requires Min guard assist for balance, pt able to work on upright posture and activating musculature, worked on bending trunk in all directions and reaching outside BoS with RUE, unable too with LUE                                    Cognition Arousal/Alertness: Awake/alert Behavior During Therapy: Flat affect Overall Cognitive Status: Impaired/Different from baseline Area of Impairment: Following commands, Awareness, Attention                 Orientation Level: Disoriented to, Time, Situation Current Attention Level: Focused Memory: Decreased short-term memory Following Commands: Follows  one step commands inconsistently Safety/Judgement: Decreased awareness of safety, Decreased awareness of deficits   Problem Solving: Slow processing, Decreased initiation, Requires verbal cues, Requires tactile cues, Difficulty sequencing General Comments: Able to state name and that she is at [facility]. Moaning "no no" throughout session, unable to  communicate needs. Looks uncomfortable.        Exercises General Exercises - Lower Extremity Ankle Circles/Pumps: PROM, Both, 10 reps, Supine Long Arc Quad: AAROM, Both, 10 reps, Seated    General Comments        Pertinent Vitals/Pain Pain Assessment Pain Assessment: Faces Faces Pain Scale: Hurts even more Pain Location: grimacing with movement esp of LUE Pain Descriptors / Indicators: Grimacing, Discomfort Pain Intervention(s): Monitored during session, Limited activity within patient's tolerance, Repositioned    Home Living                          Prior Function            PT Goals (current goals can now be found in the care plan section) Progress towards PT goals: Not progressing toward goals - comment (due to cognition, pain)    Frequency    Min 2X/week      PT Plan Current plan remains appropriate    Co-evaluation              AM-PAC PT "6 Clicks" Mobility   Outcome Measure  Help needed turning from your back to your side while in a flat bed without using bedrails?: Total Help needed moving from lying on your back to sitting on the side of a flat bed without using bedrails?: Total Help needed moving to and from a bed to a chair (including a wheelchair)?: Total Help needed standing up from a chair using your arms (e.g., wheelchair or bedside chair)?: Total Help needed to walk in hospital room?: Total Help needed climbing 3-5 steps with a railing? : Total 6 Click Score: 6    End of Session Equipment Utilized During Treatment: Gait belt Activity Tolerance: Patient limited by pain;Patient limited by fatigue;Other (comment) (cognition) Patient left: in bed;with call bell/phone within reach;with bed alarm set Nurse Communication: Mobility status;Need for lift equipment PT Visit Diagnosis: Other abnormalities of gait and mobility (R26.89);Other symptoms and signs involving the nervous system (R29.898)     Time: 1100-1115 PT Time  Calculation (min) (ACUTE ONLY): 15 min  Charges:  $Therapeutic Activity: 8-22 mins                     Marisa Severin, PT, DPT Acute Rehabilitation Services Secure chat preferred Office [phone]      Cassandra Dodson 02/08/2022, 11:33 AM

## 2022-02-08 NOTE — Plan of Care (Signed)

## 2022-02-08 NOTE — Progress Notes (Signed)
Speech Language Pathology Treatment: Dysphagia  Patient Details Name: Cassandra Dodson MRN: [id number] DOB: Oct 25, 1940 Today's Date: 02/08/2022 Time: 0852-0906 SLP Time Calculation (min) (ACUTE ONLY): 14 min  Assessment / Plan / Recommendation Clinical Impression  Pt seen and assisted with breakfast meal which she tolerated well this morning without overt s/s aspiration. Honey thick given via cup for potential upgrade from spoon presentation to cup. Needed min verbal reminders for small sips as she was able to use cup independently and for volitional throat clears. Oral manipulation functional without residue. She appeared to fatigue somewhat as session progressed. Recommend she continue Dys 1 (puree) texture, honey thick liquids (cup presentation allowed), small sips and crush meds. Pt likely to be discharged on honey thick liquids as suspect she will need this modification longer term.    HPI HPI: Cassandra Dodson is a 81 y.o. 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 In the ED, work-up revealed CT head with stable meningioma measuring 5 cm and associated vasogenic edema and regional mass effect.  Underwent IR thrombectomy 10/9. FEES 10/2021 penetration thin and nectar, wet vocal quality throughout, D3/nectar recommended.      SLP Plan  Continue with current plan of care      Recommendations for follow up therapy are one component of a multi-disciplinary discharge planning process, led by the attending physician.  Recommendations may be updated based on patient status, additional functional criteria and insurance authorization.    Recommendations  Diet recommendations: Dysphagia 1 (puree);Honey-thick liquid Liquids provided via: Cup Medication  Administration: Crushed with puree Supervision: Full supervision/cueing for compensatory strategies Compensations: Slow rate;Small sips/bites;Minimize environmental distractions Postural Changes and/or Swallow Maneuvers: Seated upright 90 degrees                Oral Care Recommendations: Oral care BID Follow Up Recommendations: Skilled nursing-short term rehab (<3 hours/day) Assistance recommended at discharge: Frequent or constant Supervision/Assistance SLP Visit Diagnosis: Dysphagia, oropharyngeal phase (R13.12) Plan: Continue with current plan of care           [name]  02/08/2022, 9:13 AM

## 2022-02-08 NOTE — Progress Notes (Signed)
Progress Note    Cassandra Dodson  QMV:[id number] DOB: [birth year]  DOA: 01/26/2022 PCP: Shon Baton, MD      Brief Narrative:    Medical records reviewed and are as summarized below:  Cassandra Dodson is a 81 y.o. female with past medical history of meningioma and multiple CVAs most recently late 12/2021 admitted for altered mental status.  Patient found to have a pulmonary embolism.  Given altered mental status, patient required to be intubated.  Patient had low blood pressures requiring Levophed.  Patient was started on Lovenox for PE and admitted to ICU for further work-up.  During hospital course, patient started bruising very badly, and hemoglobin started to drop after initiation of Lovenox, and anticoagulation was stopped.  Patient then had a mechanical thrombectomy once stable to retrieve clot.  Patient required 2 units of blood, and patient's hemoglobin remained stable afterwards.  Heparin was initiated, and patient's hemoglobin remains stable.  Patient was eventually weaned off ventilator back to room air.  Patient's blood pressures remained stable and no longer requiring pressors.  Patient is currently requiring antibiotics for pneumonia. Patient's platelets during hospitalization have started to drop, and HIT antibodies have been ordered.  Patient was transitioned from heparin to Eliquis after core track is placed.  PT OT recommends SNF.  Patient was transferred to [facility] on 02/02/2022.     No notes on file      Assessment/Plan:   Principal Problem:   Pulmonary embolism with acute cor pulmonale (HCC) Active Problems:   Brain mass   CVA (cerebral vascular accident) ([place])   Pressure injury of skin   Malnutrition of moderate degree   Nutrition Problem: Moderate Malnutrition Etiology: chronic illness (meningioma)  Signs/Symptoms: mild fat depletion, moderate muscle depletion   Body mass index is 32.8 kg/m.    Acute metabolic encephalopathy, likely secondary to saddle  massive pulmonary embolism and severe sepsis Patient extubated.  Patient is status post mechanical thrombectomy 01/28/2022.  She has been off pressors for some time now.  She seems to be at her baseline. Reportedly patient has had some disorientation issues since she was diagnosed with a meningioma in August 2023.   Hypokalemia: Replete potassium and monitor levels   #Acute hypoxic respiratory failure secondary to PE and Klebsiella pneumoniae: Resolved.  She is tolerating room air.  Completed 7 days of meropenem on 02/07/2022    #Blood loss anemia: Likely secondary to anticoagulation.  Patient's hemoglobin suddenly dropped from 8.5-7.3 on 01/29/2022 and she received 2 units of PRBC transfusion.  Anticoagulation was stopped briefly.  It has been resumed ever since.  Hemoglobin has remained stable between 8 and 9 ever since.  H&H is stable.  #Thrombocytopenia: Resolved.  HIT antibodies negative.  Continue Eliquis.   #Type 2 diabetes mellitus: Blood sugar controlled.  Continue 10 units of Semglee.  SSI.   #Meningioma Stable at this time.  No intervention needed at this time.  Follow-up with neurosurgery as outpatient.   #Stage I sacral decubitus ulcer and heel ulcer #Present on admission Continue local wound care   #Elevated liver enzymes: Improved    Dysphagia/poor p.o. intake: Oral intake is improving.  No need for PEG tube at this time.    Diet Order             DIET - DYS 1 Fluid consistency: Honey Thick  Diet effective now  Consultants: Interventional radiologist Intensivist  Procedures: Bilateral pulmonary artery thrombectomy on  01/28/2022 Cortrak NG tube placed on 02/01/2022    Medications:    apixaban  5 mg Oral BID   Chlorhexidine Gluconate Cloth  6 each Topical Q0600   docusate  100 mg Oral BID   feeding supplement (PROSource TF20)  60 mL Per Tube Daily   insulin aspart  0-15 Units Subcutaneous Q4H   insulin aspart   0-5 Units Subcutaneous QHS   insulin glargine-yfgn  10 Units Subcutaneous QHS   metoprolol tartrate  25 mg Oral BID   mouth rinse  15 mL Mouth Rinse 4 times per day   pantoprazole  40 mg Oral Daily   polyethylene glycol  17 g Oral BID   rosuvastatin  20 mg Oral Daily   senna  2 tablet Oral Daily   sodium chloride flush  10-40 mL Intracatheter Q12H   Continuous Infusions:  sodium chloride 10 mL/hr at 02/01/22 1626   feeding supplement (OSMOLITE 1.5 CAL) 1,000 mL (02/02/22 1517)     Anti-infectives (From admission, onward)    Start     Dose/Rate Route Frequency Ordered Stop   01/31/22 2100  meropenem (MERREM) 1 g in sodium chloride 0.9 % 100 mL IVPB  Status:  Discontinued        1 g 200 mL/hr over 30 Minutes Intravenous Every 8 hours 01/31/22 1656 02/07/22 1411   01/31/22 1445  ceFEPIme (MAXIPIME) 2 g in sodium chloride 0.9 % 100 mL IVPB  Status:  Discontinued        2 g 200 mL/hr over 30 Minutes Intravenous Every 12 hours 01/31/22 1359 01/31/22 1656   01/27/22 2200  ceFEPIme (MAXIPIME) 2 g in sodium chloride 0.9 % 100 mL IVPB  Status:  Discontinued        2 g 200 mL/hr over 30 Minutes Intravenous Every 12 hours 01/27/22 1410 01/29/22 1200   01/26/22 2000  ceFEPIme (MAXIPIME) 2 g in sodium chloride 0.9 % 100 mL IVPB  Status:  Discontinued        2 g 200 mL/hr over 30 Minutes Intravenous Every 8 hours 01/26/22 1316 01/27/22 1410   01/26/22 1030  ceFEPIme (MAXIPIME) 2 g in sodium chloride 0.9 % 100 mL IVPB        2 g 200 mL/hr over 30 Minutes Intravenous  Once 01/26/22 1015 01/26/22 1317   01/26/22 1030  vancomycin (VANCOREADY) IVPB 2000 mg/400 mL  Status:  Discontinued        2,000 mg 200 mL/hr over 120 Minutes Intravenous  Once 01/26/22 1015 01/26/22 1629              Family Communication/Anticipated D/C date and plan/Code Status   DVT prophylaxis:  apixaban (ELIQUIS) tablet 5 mg     Code Status: Full Code  Family Communication: None Disposition Plan: Plan to  discharge to SNF   Status is: Inpatient Remains inpatient appropriate because: Awaiting placement to SNF.       Subjective:   Interval events noted.  She has no complaints.  Objective:    Vitals:   02/07/22 0800 02/07/22 1447 02/08/22 0419 02/08/22 1040  BP: 127/76 133/88 (!) 144/68 136/77  Pulse: 91 67 72 79  Resp: '16 18 19   '$ Temp: 98.6 F (37 C) 98.8 F (37.1 C) 98.2 F (36.8 C)   TempSrc: Oral  Oral   SpO2: 100% 95% 94%   Weight:      Height:  No data found.   Intake/Output Summary (Last 24 hours) at 02/08/2022 1238 Last data filed at 02/08/2022 1045 Gross per 24 hour  Intake 350 ml  Output --  Net 350 ml   Filed Weights   01/29/22 0453 01/31/22 0500 02/01/22 0500  Weight: 84 kg 84 kg 84 kg    Exam:  GEN: NAD SKIN: Warm and dry EYES: EOMI ENT: MMM CV: RRR PULM: CTA B ABD: soft, obese , NT, +BS CNS: AAO x 1 (person), non focal EXT: Left upper extremity swelling with some bruising.  Mild tenderness over left knee without swelling or erythema      Pressure Injury 01/26/22 Buttocks Mid Stage 1 -  Intact skin with non-blanchable redness of a localized area usually over a bony prominence. (Active)  01/26/22 1549  Location: Buttocks  Location Orientation: Mid  Staging: Stage 1 -  Intact skin with non-blanchable redness of a localized area usually over a bony prominence.  Wound Description (Comments):   Present on Admission: Yes  Dressing Type Foam - Lift dressing to assess site every shift 02/07/22 2000     Pressure Injury 01/26/22 Heel Left Stage 1 -  Intact skin with non-blanchable redness of a localized area usually over a bony prominence. (Active)  01/26/22 1500  Location: Heel  Location Orientation: Left  Staging: Stage 1 -  Intact skin with non-blanchable redness of a localized area usually over a bony prominence.  Wound Description (Comments):   Present on Admission: Yes  Dressing Type Foam - Lift dressing to assess site every  shift 02/05/22 1142     Pressure Injury 01/28/22 Buttocks Right Deep Tissue Pressure Injury - Purple or maroon localized area of discolored intact skin or blood-filled blister due to damage of underlying soft tissue from pressure and/or shear. (Active)  01/28/22 1642  Location: Buttocks  Location Orientation: Right  Staging: Deep Tissue Pressure Injury - Purple or maroon localized area of discolored intact skin or blood-filled blister due to damage of underlying soft tissue from pressure and/or shear.  Wound Description (Comments):   Present on Admission:   Dressing Type Foam - Lift dressing to assess site every shift 02/05/22 1142     Data Reviewed:   I have personally reviewed following labs and imaging studies:  Labs: Labs show the following:   Basic Metabolic Panel: Recent Labs  Lab 02/02/22 0307 02/03/22 0249 02/08/22 0349  NA 136 138 143  K 3.4* 3.8 3.2*  CL 100 104 108  CO2 '28 27 27  '$ GLUCOSE 265* 141* 184*  BUN 29* 23 28*  CREATININE 0.61 0.52 0.63  CALCIUM 9.2 9.3 9.0  MG  --   --  2.0   GFR Estimated Creatinine Clearance: 55.6 mL/min (by C-G formula based on SCr of 0.63 mg/dL). Liver Function Tests: Recent Labs  Lab 02/02/22 0307 02/08/22 0349  AST 25 14*  ALT 64* 20  ALKPHOS 59 65  BILITOT 0.6 0.6  PROT 5.0* 5.0*  ALBUMIN 2.2* 2.2*   No results for input(s): "LIPASE", "AMYLASE" in the last 168 hours. No results for input(s): "AMMONIA" in the last 168 hours. Coagulation profile No results for input(s): "INR", "PROTIME" in the last 168 hours.  CBC: Recent Labs  Lab 02/02/22 0307 02/03/22 0249 02/04/22 0401 02/06/22 1902 02/08/22 0349  WBC 11.5* 10.2 8.1 9.2 9.1  NEUTROABS  --   --   --  7.2 7.0  HGB 8.6* 8.5* 8.1* 8.2* 8.0*  HCT 27.2* 26.8* 26.0* 26.5* 25.8*  MCV  93.8 92.7 94.9 96.0 96.3  PLT 141* 147* 141* 199 210   Cardiac Enzymes: No results for input(s): "CKTOTAL", "CKMB", "CKMBINDEX", "TROPONINI" in the last 168 hours. BNP (last 3  results) No results for input(s): "PROBNP" in the last 8760 hours. CBG: Recent Labs  Lab 02/07/22 1628 02/07/22 2030 02/08/22 0426 02/08/22 1039 02/08/22 1143  GLUCAP 106* 140* 154* 170* 171*   D-Dimer: No results for input(s): "DDIMER" in the last 72 hours. Hgb A1c: No results for input(s): "HGBA1C" in the last 72 hours. Lipid Profile: No results for input(s): "CHOL", "HDL", "LDLCALC", "TRIG", "CHOLHDL", "LDLDIRECT" in the last 72 hours. Thyroid function studies: No results for input(s): "TSH", "T4TOTAL", "T3FREE", "THYROIDAB" in the last 72 hours.  Invalid input(s): "FREET3" Anemia work up: No results for input(s): "VITAMINB12", "FOLATE", "FERRITIN", "TIBC", "IRON", "RETICCTPCT" in the last 72 hours. Sepsis Labs: Recent Labs  Lab 02/03/22 0249 02/04/22 0401 02/06/22 1902 02/08/22 0349  WBC 10.2 8.1 9.2 9.1    Microbiology Recent Results (from the past 240 hour(s))  Expectorated Sputum Assessment w Gram Stain, Rflx to Resp Cult     Status: None   Collection Time: 01/30/22  9:49 AM   Specimen: Expectorated Sputum  Result Value Ref Range Status   Specimen Description EXPECTORATED SPUTUM  Final   Special Requests NONE  Final   Sputum evaluation   Final    THIS SPECIMEN IS ACCEPTABLE FOR SPUTUM CULTURE Performed at [facility], [place] [address] [place], [place] [address]    Report Status 02/01/2022 FINAL  Final  Culture, Respiratory w Gram Stain     Status: None   Collection Time: 01/30/22  9:49 AM  Result Value Ref Range Status   Specimen Description EXPECTORATED SPUTUM  Final   Special Requests NONE Reflexed from [id number]  Final   Gram Stain   Final    MODERATE WBC PRESENT,BOTH PMN AND MONONUCLEAR FEW GRAM POSITIVE COCCI IN CLUSTERS FEW GRAM NEGATIVE RODS FEW YEAST WITH PSEUDOHYPHAE Performed at [facility], [place] [address], [place], [place] [address]    Culture   Final    MODERATE KLEBSIELLA PNEUMONIAE Confirmed Extended Spectrum Beta-Lactamase  Producer (ESBL).  In bloodstream infections from ESBL organisms, carbapenems are preferred over piperacillin/tazobactam. They are shown to have a lower risk of mortality.    Report Status 02/01/2022 FINAL  Final   Organism ID, Bacteria KLEBSIELLA PNEUMONIAE  Final      Susceptibility   Klebsiella pneumoniae - MIC*    AMPICILLIN >=32 RESISTANT Resistant     CEFAZOLIN >=64 RESISTANT Resistant     CEFEPIME >=32 RESISTANT Resistant     CEFTAZIDIME RESISTANT Resistant     CEFTRIAXONE >=64 RESISTANT Resistant     CIPROFLOXACIN <=0.25 SENSITIVE Sensitive     GENTAMICIN <=1 SENSITIVE Sensitive     IMIPENEM <=0.25 SENSITIVE Sensitive     TRIMETH/SULFA >=320 RESISTANT Resistant     AMPICILLIN/SULBACTAM >=32 RESISTANT Resistant     PIP/TAZO 16 SENSITIVE Sensitive     * MODERATE KLEBSIELLA PNEUMONIAE    Procedures and diagnostic studies:  No results found.             LOS: 13 days   [employer], advertising on [email or web address]. If 7PM-7AM, please contact night-coverage at [email or web address]     02/08/2022, 12:38 PM

## 2022-02-09 DIAGNOSIS — I2602 Saddle embolus of pulmonary artery with acute cor pulmonale: Secondary | ICD-10-CM | POA: Diagnosis not present

## 2022-02-09 DIAGNOSIS — I6322 Cerebral infarction due to unspecified occlusion or stenosis of basilar arteries: Secondary | ICD-10-CM | POA: Diagnosis not present

## 2022-02-09 LAB — BASIC METABOLIC PANEL
Anion gap: 5 (ref 5–15)
BUN: 19 mg/dL (ref 8–23)
CO2: 27 mmol/L (ref 22–32)
Calcium: 9.1 mg/dL (ref 8.9–10.3)
Chloride: 112 mmol/L — ABNORMAL HIGH (ref 98–111)
Creatinine, Ser: 0.51 mg/dL (ref 0.44–1.00)
GFR, Estimated: >60 mL/min (ref >60)
Glucose, Bld: 111 mg/dL — ABNORMAL HIGH (ref 70–99)
Potassium: 3.7 mmol/L (ref 3.5–5.1)
Sodium: 144 mmol/L (ref 135–145)

## 2022-02-09 LAB — GLUCOSE, CAPILLARY
Glucose-Capillary: 102 mg/dL — ABNORMAL HIGH (ref 70–99)
Glucose-Capillary: 111 mg/dL — ABNORMAL HIGH (ref 70–99)
Glucose-Capillary: 119 mg/dL — ABNORMAL HIGH (ref 70–99)
Glucose-Capillary: 138 mg/dL — ABNORMAL HIGH (ref 70–99)
Glucose-Capillary: 139 mg/dL — ABNORMAL HIGH (ref 70–99)
Glucose-Capillary: 169 mg/dL — ABNORMAL HIGH (ref 70–99)
Glucose-Capillary: 60 mg/dL — ABNORMAL LOW (ref 70–99)
Glucose-Capillary: 87 mg/dL (ref 70–99)

## 2022-02-09 MED ORDER — DEXTROSE 50 % IV SOLN
12.5000 g | INTRAVENOUS | Status: AC
  Administered 2022-02-09: 12.5 g via INTRAVENOUS
  Filled 2022-02-09: qty 50

## 2022-02-09 NOTE — Progress Notes (Addendum)
[facility] PROGRESS NOTE    Progress Note  Cassandra Dodson  ZOX:[id number] DOB: 12-08-1940 DOA: 01/26/2022 PCP: Shon Baton, MD     Brief Narrative:   Cassandra Dodson is an 81 y.o. 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  She was switched to IV heparin and her hemoglobin has remained stable, she was weaned off pressors.  Started developing thrombocytopenia core track was placed we will switch to Eliquis and transferred to [facility] on 02/02/2022.  Physical therapy evaluated the patient recommended skilled nursing facility.  Awaiting skilled nursing facility placement    Assessment/Plan:   Acute metabolic encephalopathy: Likely due to hypoxia and hypotension. Initially intubated now extubated. Evaluated the patient recommended dysphagia 1 diet.  Pulmonary embolism with acute cor pulmonale [facility]): Required intubation and pressors status post mechanical thrombectomy. Now on Eliquis through core track.  Hypokalemia: Replete orally basic metabolic panels pending this morning.  Acute blood loss anemia: Likely due to anticoagulation, she status post 2 units packed red blood cells hemoglobin has remained relatively stable.  Thrombocytopenia: Resolved HIT antibodies negative now on Eliquis.  Diabetes mellitus type 2: Continue long-acting insulin plus sliding scale, blood glucose relatively well controlled.  Meningioma: Follow-up with neurosurgery as an outpatient.  Dysphagia/poor oral intake: Oral intake is improving no need for PEG tube  Secondary cubitus  ulcer stage I present on admission: RN Pressure Injury Documentation: Pressure Injury 01/26/22 Buttocks Mid Stage 1 -  Intact skin with non-blanchable redness of a localized area usually over a bony prominence. (Active)  01/26/22 1549  Location: Buttocks  Location Orientation: Mid  Staging: Stage 1 -  Intact skin with non-blanchable redness of a localized area usually over a bony prominence.  Wound Description (Comments):   Present on Admission: Yes  Dressing Type Foam - Lift dressing to assess site every shift 02/08/22 0804     Pressure Injury 01/26/22 Heel Left Stage 1 -  Intact skin with non-blanchable redness of a localized area usually over a bony prominence. (Active)  01/26/22 1500  Location: Heel  Location Orientation: Left  Staging: Stage 1 -  Intact skin with non-blanchable redness of a localized area usually over a bony prominence.  Wound Description (Comments):   Present on Admission: Yes  Dressing Type Foam - Lift dressing to assess site every shift 02/08/22 0804     Pressure Injury 01/28/22 Buttocks Right Deep Tissue Pressure Injury - Purple or maroon localized area of discolored intact skin or blood-filled blister due to damage of underlying soft tissue from pressure and/or shear. (Active)  01/28/22 1642  Location: Buttocks  Location Orientation: Right  Staging: Deep Tissue Pressure Injury - Purple or maroon localized area of discolored intact skin or blood-filled blister due to damage of underlying soft tissue from pressure and/or shear.  Wound Description (Comments):   Present on Admission:   Dressing Type Foam - Lift dressing to assess site every shift 02/08/22 0804    DVT prophylaxis: eliquis Family Communication:none Status is: Inpatient Remains inpatient appropriate because: Awaiting skilled nursing facility placement.    Code Status:     Code Status Orders  (From admission,  onward)           Start     Ordered   01/26/22 1314  Full code  Continuous         01/26/22 1315           Code Status History     Date Active Date Inactive Code Status Order ID Comments User Context   01/11/2022 2240 01/17/2022 1905 Full Code [id number]  Kayleen Memos, DO ED   11/05/2021 1505 11/27/2021 1845 Full Code [id number]  Cathlyn Parsons, PA-C Inpatient   11/01/2021 1823 11/05/2021 1500 Full Code [id number]  Luanne Bras, MD Inpatient   10/25/2021 2157 11/01/2021 1823 Full Code [id number]  Kayleen Memos, DO Inpatient   06/24/2019 1937 06/24/2019 1556 Full Code [id number]  Dorothyann Gibbs, NP Inpatient   06/27/2016 1659 06/29/2016 1730 Full Code [id number]  Leandrew Koyanagi, MD Inpatient   02/23/2015 2009 02/27/2015 1833 Full Code [id number]  Leandrew Koyanagi, MD Inpatient   08/17/2013 1335 08/18/2013 0330 Full Code [id number]  Dereck Ligas, MD HOV         IV Access:   Peripheral IV   Procedures and diagnostic studies:   No results found.   Medical Consultants:   None.   Subjective:    Cassandra Dodson sleepy this morning no complaints.  Objective:    Vitals:   02/08/22 1501 02/08/22 1800 02/08/22 2100 02/09/22 0500  BP: (!) 109/48 129/87 132/88 (!) 129/59  Pulse: (!) 56 74 85 72  Resp: '16 16 17 13  '$ Temp: 97.9 F (36.6 C)  98 F (36.7 C) 98.5 F (36.9 C)  TempSrc: Oral  Oral Oral  SpO2: 96%  98% 95%  Weight:    75 kg  Height:       SpO2: 95 % O2 Flow Rate (L/min): 2 L/min FiO2 (%): 30 %   Intake/Output Summary (Last 24 hours) at 02/09/2022 0816 Last data filed at 02/08/2022 1800 Gross per 24 hour  Intake 370 ml  Output --  Net 370 ml   Filed Weights   01/31/22 0500 02/01/22 0500 02/09/22 0500  Weight: 84 kg 84 kg 75 kg    Exam: General exam: In no acute distress. Respiratory system: Good air movement and clear to auscultation. Cardiovascular system: S1 & S2 heard, RRR. No JVD. Gastrointestinal system: Abdomen is nondistended, soft and nontender.  Extremities: No pedal edema. Skin: Multiple bruises   Data Reviewed:     Labs: Basic Metabolic Panel: Recent Labs  Lab 02/03/22 0249 02/08/22 0349  NA 138 143  K 3.8 3.2*  CL 104 108  CO2 27 27  GLUCOSE 141* 184*  BUN 23 28*  CREATININE 0.52 0.63  CALCIUM 9.3 9.0  MG  --  2.0   GFR Estimated Creatinine Clearance: 52.6 mL/min (by C-G formula based on SCr of 0.63 mg/dL). Liver Function Tests: Recent Labs  Lab 02/08/22 0349  AST 14*  ALT 20  ALKPHOS 65  BILITOT 0.6  PROT 5.0*  ALBUMIN 2.2*   No results for input(s): "LIPASE", "AMYLASE" in the last 168 hours. No results for input(s): "AMMONIA" in the last 168 hours. Coagulation profile No results for input(s): "INR", "PROTIME" in the last 168 hours. COVID-19 Labs  No results for input(s): "DDIMER", "FERRITIN", "LDH", "CRP" in the last 72 hours.  Lab Results  Component Value Date   SARSCOV2NAA NEGATIVE 01/26/2022   [place] NEGATIVE 11/16/2021   [place] NEGATIVE 11/11/2021   [place] NEGATIVE 11/08/2021    CBC:  Recent Labs  Lab 02/03/22 0249 02/04/22 0401 02/06/22 1902 02/08/22 0349  WBC 10.2 8.1 9.2 9.1  NEUTROABS  --   --  7.2 7.0  HGB 8.5* 8.1* 8.2* 8.0*  HCT 26.8* 26.0* 26.5* 25.8*  MCV 92.7 94.9 96.0 96.3  PLT 147* 141* 199 210   Cardiac Enzymes: No results for input(s): "CKTOTAL", "CKMB", "CKMBINDEX", "TROPONINI" in the last 168 hours. BNP (last 3 results) No results for input(s): "PROBNP" in the last 8760 hours. CBG: Recent Labs  Lab 02/08/22 1633 02/08/22 1949 02/08/22 2328 02/09/22 0043 02/09/22 0321  GLUCAP 79 139* 140* 111* 102*   D-Dimer: No results for input(s): "DDIMER" in the last 72 hours. Hgb A1c: No results for input(s): "HGBA1C" in the last 72 hours. Lipid Profile: No results for input(s): "CHOL", "HDL", "LDLCALC", "TRIG", "CHOLHDL", "LDLDIRECT" in the last 72 hours. Thyroid function studies: No results for input(s): "TSH", "T4TOTAL", "T3FREE", "THYROIDAB" in the last 72 hours.  Invalid input(s): "FREET3" Anemia work up: No  results for input(s): "VITAMINB12", "FOLATE", "FERRITIN", "TIBC", "IRON", "RETICCTPCT" in the last 72 hours. Sepsis Labs: Recent Labs  Lab 02/03/22 0249 02/04/22 0401 02/06/22 1902 02/08/22 0349  WBC 10.2 8.1 9.2 9.1   Microbiology Recent Results (from the past 240 hour(s))  Expectorated Sputum Assessment w Gram Stain, Rflx to Resp Cult     Status: None   Collection Time: 01/30/22  9:49 AM   Specimen: Expectorated Sputum  Result Value Ref Range Status   Specimen Description EXPECTORATED SPUTUM  Final   Special Requests NONE  Final   Sputum evaluation   Final    THIS SPECIMEN IS ACCEPTABLE FOR SPUTUM CULTURE Performed at [facility], [place] [address] [place], [place] [address]    Report Status 02/01/2022 FINAL  Final  Culture, Respiratory w Gram Stain     Status: None   Collection Time: 01/30/22  9:49 AM  Result Value Ref Range Status   Specimen Description EXPECTORATED SPUTUM  Final   Special Requests NONE Reflexed from [id number]  Final   Gram Stain   Final    MODERATE WBC PRESENT,BOTH PMN AND MONONUCLEAR FEW GRAM POSITIVE COCCI IN CLUSTERS FEW GRAM NEGATIVE RODS FEW YEAST WITH PSEUDOHYPHAE Performed at [facility], [place] [address] [place], [place] [address]    Culture   Final    MODERATE KLEBSIELLA PNEUMONIAE Confirmed Extended Spectrum Beta-Lactamase Producer (ESBL).  In bloodstream infections from ESBL organisms, carbapenems are preferred over piperacillin/tazobactam. They are shown to have a lower risk of mortality.    Report Status 02/01/2022 FINAL  Final   Organism ID, Bacteria KLEBSIELLA PNEUMONIAE  Final      Susceptibility   Klebsiella pneumoniae - MIC*    AMPICILLIN >=32 RESISTANT Resistant     CEFAZOLIN >=64 RESISTANT Resistant     CEFEPIME >=32 RESISTANT Resistant     CEFTAZIDIME RESISTANT Resistant     CEFTRIAXONE >=64 RESISTANT Resistant     CIPROFLOXACIN <=0.25 SENSITIVE Sensitive     GENTAMICIN <=1 SENSITIVE Sensitive     IMIPENEM <=0.25  SENSITIVE Sensitive     TRIMETH/SULFA >=320 RESISTANT Resistant     AMPICILLIN/SULBACTAM >=32 RESISTANT Resistant     PIP/TAZO 16 SENSITIVE Sensitive     * MODERATE KLEBSIELLA PNEUMONIAE     Medications:    apixaban  5 mg Oral BID   Chlorhexidine Gluconate Cloth  6 each Topical Q0600   docusate  100 mg Oral BID   feeding supplement (PROSource TF20)  60 mL  Per Tube Daily   insulin aspart  0-15 Units Subcutaneous Q4H   insulin aspart  0-5 Units Subcutaneous QHS   insulin glargine-yfgn  10 Units Subcutaneous QHS   metoprolol tartrate  25 mg Oral BID   mouth rinse  15 mL Mouth Rinse 4 times per day   pantoprazole  40 mg Oral Daily   polyethylene glycol  17 g Oral BID   rosuvastatin  20 mg Oral Daily   senna  2 tablet Oral Daily   sodium chloride flush  10-40 mL Intracatheter Q12H   Continuous Infusions:  sodium chloride 10 mL/hr at 02/01/22 1626   feeding supplement (OSMOLITE 1.5 CAL) 1,000 mL (02/09/22 0333)      LOS: 14 days   Charlynne Cousins  [facility]  02/09/2022, 8:16 AM

## 2022-02-10 DIAGNOSIS — I6322 Cerebral infarction due to unspecified occlusion or stenosis of basilar arteries: Secondary | ICD-10-CM | POA: Diagnosis not present

## 2022-02-10 DIAGNOSIS — I2602 Saddle embolus of pulmonary artery with acute cor pulmonale: Secondary | ICD-10-CM | POA: Diagnosis not present

## 2022-02-10 LAB — BASIC METABOLIC PANEL
Anion gap: 6 (ref 5–15)
BUN: 17 mg/dL (ref 8–23)
CO2: 27 mmol/L (ref 22–32)
Calcium: 9.2 mg/dL (ref 8.9–10.3)
Chloride: 110 mmol/L (ref 98–111)
Creatinine, Ser: 0.49 mg/dL (ref 0.44–1.00)
GFR, Estimated: >60 mL/min (ref >60)
Glucose, Bld: 123 mg/dL — ABNORMAL HIGH (ref 70–99)
Potassium: 3.6 mmol/L (ref 3.5–5.1)
Sodium: 143 mmol/L (ref 135–145)

## 2022-02-10 LAB — GLUCOSE, CAPILLARY
Glucose-Capillary: 102 mg/dL — ABNORMAL HIGH (ref 70–99)
Glucose-Capillary: 105 mg/dL — ABNORMAL HIGH (ref 70–99)
Glucose-Capillary: 176 mg/dL — ABNORMAL HIGH (ref 70–99)
Glucose-Capillary: 193 mg/dL — ABNORMAL HIGH (ref 70–99)
Glucose-Capillary: 255 mg/dL — ABNORMAL HIGH (ref 70–99)
Glucose-Capillary: 258 mg/dL — ABNORMAL HIGH (ref 70–99)

## 2022-02-10 NOTE — Plan of Care (Signed)
  Problem: Pain Managment: Goal: General experience of comfort will improve Outcome: Progressing   Problem: Safety: Goal: Ability to remain free from injury will improve Outcome: Progressing   Problem: Skin Integrity: Goal: Risk for impaired skin integrity will decrease Outcome: Progressing   Problem: Nutritional: Goal: Maintenance of adequate nutrition will improve Outcome: Progressing

## 2022-02-10 NOTE — TOC Progression Note (Signed)
Transition of Care [facility]) - Progression Note    Patient Details  Name: Cassandra Dodson MRN: [id number] Date of Birth: 14-Jul-1940  Transition of Care [facility]) CM/SW Contact  Reece Agar, [place] Phone Number: 02/10/2022, 11:35 AM  Clinical Narrative:    Josem Kaufmann started ref# [id number], pending.    Expected Discharge Plan: [place] Barriers to Discharge: Continued Medical Work up, SNF Pending bed offer  Expected Discharge Plan and Services Expected Discharge Plan: [place] In-house Referral: Clinical Social Work   Post Acute Care Choice: [place] Living arrangements for the past 2 months: Single Family Home                                       Social Determinants of Health (SDOH) Interventions    Readmission Risk Interventions     No data to display

## 2022-02-10 NOTE — Progress Notes (Signed)
[facility] PROGRESS NOTE    Progress Note  Cassandra Dodson  KDT:[id number] DOB: 09-07-1940 DOA: 01/26/2022 PCP: Shon Baton, MD     Brief Narrative:   Cassandra Dodson is an 81 y.o. 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  She was switched to IV heparin and her hemoglobin has remained stable, she was weaned off pressors.  Started developing thrombocytopenia core track was placed we will switch to Eliquis and transferred to [facility] on 02/02/2022.  Physical therapy evaluated the patient recommended skilled nursing facility.  Awaiting skilled nursing facility placement    Assessment/Plan:   Acute metabolic encephalopathy: Likely due to hypoxia and hypotension. Initially intubated now extubated. Evaluated the patient recommended dysphagia 1 diet. No complaints today. She has underlying dementia.  Pulmonary embolism with acute cor pulmonale [facility]): Required intubation and pressors status post mechanical thrombectomy. Now on Eliquis through core track.  Hypokalemia: Replete orally basic metabolic panels pending this morning.  Acute blood loss anemia: Likely due to anticoagulation, she status post 2 units packed red blood cells hemoglobin has remained relatively stable.  Thrombocytopenia: Resolved HIT antibodies negative now on Eliquis.  Diabetes mellitus type 2: Continue long-acting insulin plus sliding scale, blood glucose relatively well controlled.  Meningioma: Follow-up with neurosurgery as an outpatient.  Dysphagia/poor oral intake: Oral intake is  improving no need for PEG tube  Secondary cubitus ulcer stage I present on admission: RN Pressure Injury Documentation: Pressure Injury 01/26/22 Buttocks Mid Stage 1 -  Intact skin with non-blanchable redness of a localized area usually over a bony prominence. (Active)  01/26/22 1549  Location: Buttocks  Location Orientation: Mid  Staging: Stage 1 -  Intact skin with non-blanchable redness of a localized area usually over a bony prominence.  Wound Description (Comments):   Present on Admission: Yes  Dressing Type Foam - Lift dressing to assess site every shift 02/09/22 2300     Pressure Injury 01/26/22 Heel Left Stage 1 -  Intact skin with non-blanchable redness of a localized area usually over a bony prominence. (Active)  01/26/22 1500  Location: Heel  Location Orientation: Left  Staging: Stage 1 -  Intact skin with non-blanchable redness of a localized area usually over a bony prominence.  Wound Description (Comments):   Present on Admission: Yes  Dressing Type Foam - Lift dressing to assess site every shift 02/09/22 2300     Pressure Injury 01/28/22 Buttocks Right Deep Tissue Pressure Injury - Purple or maroon localized area of discolored intact skin or blood-filled blister due to damage of underlying soft tissue from pressure and/or shear. (Active)  01/28/22 1642  Location: Buttocks  Location Orientation: Right  Staging: Deep Tissue Pressure Injury - Purple or maroon localized area of discolored intact skin or blood-filled blister due to damage of underlying soft tissue from pressure and/or shear.  Wound Description (Comments):   Present on Admission:   Dressing Type Foam - Lift dressing to assess site every shift 02/08/22 0804    DVT prophylaxis: eliquis Family Communication:none Status is: Inpatient Remains inpatient appropriate because: Awaiting skilled nursing facility placement.    Code Status:  Code Status Orders  (From admission, onward)           Start      Ordered   01/26/22 1314  Full code  Continuous        01/26/22 1315           Code Status History     Date Active Date Inactive Code Status Order ID Comments User Context   01/11/2022 2240 01/17/2022 1905 Full Code [id number]  Kayleen Memos, DO ED   11/05/2021 1505 11/27/2021 1845 Full Code [id number]  Cathlyn Parsons, PA-C Inpatient   11/01/2021 1823 11/05/2021 1500 Full Code [id number]  Luanne Bras, MD Inpatient   10/25/2021 2157 11/01/2021 1823 Full Code [id number]  Kayleen Memos, DO Inpatient   06/24/2019 4854 06/24/2019 1556 Full Code [id number]  Dorothyann Gibbs, NP Inpatient   06/27/2016 1659 06/29/2016 1730 Full Code [id number]  Leandrew Koyanagi, MD Inpatient   02/23/2015 2009 02/27/2015 1833 Full Code [id number]  Leandrew Koyanagi, MD Inpatient   08/17/2013 1335 08/18/2013 0330 Full Code [id number]  Dereck Ligas, MD HOV         IV Access:   Peripheral IV   Procedures and diagnostic studies:   No results found.   Medical Consultants:   None.   Subjective:    Cassandra Dodson no complaints today.  Objective:    Vitals:   02/09/22 1845 02/09/22 2000 02/10/22 0408 02/10/22 0901  BP: (!) 143/70 (!) 153/73 (!) 144/87 (!) 141/82  Pulse: 90  93 94  Resp:  '16 16 16  '$ Temp:  98.5 F (36.9 C) 98.4 F (36.9 C) 98.6 F (37 C)  TempSrc:   Oral   SpO2: 95%  100% 93%  Weight:      Height:       SpO2: 93 % O2 Flow Rate (L/min): 2 L/min FiO2 (%): 30 %   Intake/Output Summary (Last 24 hours) at 02/10/2022 1000 Last data filed at 02/10/2022 0942 Gross per 24 hour  Intake 10 ml  Output 750 ml  Net -740 ml    Filed Weights   01/31/22 0500 02/01/22 0500 02/09/22 0500  Weight: 84 kg 84 kg 75 kg    Exam: General exam: In no acute distress. Respiratory system: Good air movement and clear to auscultation. Cardiovascular system: S1 & S2 heard, RRR. No JVD. Gastrointestinal system: Abdomen is nondistended, soft and nontender.  Extremities: No pedal edema. Skin: Multiple  bruises   Data Reviewed:    Labs: Basic Metabolic Panel: Recent Labs  Lab 02/08/22 0349 02/09/22 1554 02/10/22 0349  NA 143 144 143  K 3.2* 3.7 3.6  CL 108 112* 110  CO2 '27 27 27  '$ GLUCOSE 184* 111* 123*  BUN 28* 19 17  CREATININE 0.63 0.51 0.49  CALCIUM 9.0 9.1 9.2  MG 2.0  --   --     GFR Estimated Creatinine Clearance: 52.6 mL/min (by C-G formula based on SCr of 0.49 mg/dL). Liver Function Tests: Recent Labs  Lab 02/08/22 0349  AST 14*  ALT 20  ALKPHOS 65  BILITOT 0.6  PROT 5.0*  ALBUMIN 2.2*    No results for input(s): "LIPASE", "AMYLASE" in the last 168 hours. No results for input(s): "AMMONIA" in the last 168 hours. Coagulation profile No results for input(s): "INR", "PROTIME" in the last 168 hours. COVID-19 Labs  No results for input(s): "DDIMER", "FERRITIN", "LDH", "CRP" in the last 72 hours.  Lab Results  Component Value Date   SARSCOV2NAA  NEGATIVE 01/26/2022   SARSCOV2NAA NEGATIVE 11/16/2021   SARSCOV2NAA NEGATIVE 11/11/2021   [place] NEGATIVE 11/08/2021    CBC: Recent Labs  Lab 02/04/22 0401 02/06/22 1902 02/08/22 0349  WBC 8.1 9.2 9.1  NEUTROABS  --  7.2 7.0  HGB 8.1* 8.2* 8.0*  HCT 26.0* 26.5* 25.8*  MCV 94.9 96.0 96.3  PLT 141* 199 210    Cardiac Enzymes: No results for input(s): "CKTOTAL", "CKMB", "CKMBINDEX", "TROPONINI" in the last 168 hours. BNP (last 3 results) No results for input(s): "PROBNP" in the last 8760 hours. CBG: Recent Labs  Lab 02/09/22 1957 02/09/22 2138 02/09/22 2352 02/10/22 0420 02/10/22 0901  GLUCAP 60* 169* 139* 102* 105*    D-Dimer: No results for input(s): "DDIMER" in the last 72 hours. Hgb A1c: No results for input(s): "HGBA1C" in the last 72 hours. Lipid Profile: No results for input(s): "CHOL", "HDL", "LDLCALC", "TRIG", "CHOLHDL", "LDLDIRECT" in the last 72 hours. Thyroid function studies: No results for input(s): "TSH", "T4TOTAL", "T3FREE", "THYROIDAB" in the last 72  hours.  Invalid input(s): "FREET3" Anemia work up: No results for input(s): "VITAMINB12", "FOLATE", "FERRITIN", "TIBC", "IRON", "RETICCTPCT" in the last 72 hours. Sepsis Labs: Recent Labs  Lab 02/04/22 0401 02/06/22 1902 02/08/22 0349  WBC 8.1 9.2 9.1    Microbiology No results found for this or any previous visit (from the past 240 hour(s)).    Medications:    apixaban  5 mg Oral BID   Chlorhexidine Gluconate Cloth  6 each Topical Q0600   docusate  100 mg Oral BID   feeding supplement (PROSource TF20)  60 mL Per Tube Daily   insulin aspart  0-15 Units Subcutaneous Q4H   insulin aspart  0-5 Units Subcutaneous QHS   insulin glargine-yfgn  10 Units Subcutaneous QHS   metoprolol tartrate  25 mg Oral BID   mouth rinse  15 mL Mouth Rinse 4 times per day   pantoprazole  40 mg Oral Daily   polyethylene glycol  17 g Oral BID   rosuvastatin  20 mg Oral Daily   senna  2 tablet Oral Daily   sodium chloride flush  10-40 mL Intracatheter Q12H   Continuous Infusions:  sodium chloride 10 mL/hr at 02/01/22 1626   feeding supplement (OSMOLITE 1.5 CAL) 1,000 mL (02/09/22 0333)      LOS: 15 days   Charlynne Cousins  [facility]  02/10/2022, 10:00 AM

## 2022-02-11 DIAGNOSIS — I6322 Cerebral infarction due to unspecified occlusion or stenosis of basilar arteries: Secondary | ICD-10-CM | POA: Diagnosis not present

## 2022-02-11 DIAGNOSIS — I2602 Saddle embolus of pulmonary artery with acute cor pulmonale: Secondary | ICD-10-CM | POA: Diagnosis not present

## 2022-02-11 LAB — BASIC METABOLIC PANEL
Anion gap: 5 (ref 5–15)
BUN: 31 mg/dL — ABNORMAL HIGH (ref 8–23)
CO2: 26 mmol/L (ref 22–32)
Calcium: 9.2 mg/dL (ref 8.9–10.3)
Chloride: 111 mmol/L (ref 98–111)
Creatinine, Ser: 0.71 mg/dL (ref 0.44–1.00)
GFR, Estimated: >60 mL/min (ref >60)
Glucose, Bld: 171 mg/dL — ABNORMAL HIGH (ref 70–99)
Potassium: 3.7 mmol/L (ref 3.5–5.1)
Sodium: 142 mmol/L (ref 135–145)

## 2022-02-11 LAB — GLUCOSE, CAPILLARY
Glucose-Capillary: 100 mg/dL — ABNORMAL HIGH (ref 70–99)
Glucose-Capillary: 137 mg/dL — ABNORMAL HIGH (ref 70–99)
Glucose-Capillary: 157 mg/dL — ABNORMAL HIGH (ref 70–99)
Glucose-Capillary: 161 mg/dL — ABNORMAL HIGH (ref 70–99)
Glucose-Capillary: 182 mg/dL — ABNORMAL HIGH (ref 70–99)
Glucose-Capillary: 78 mg/dL (ref 70–99)

## 2022-02-11 MED ORDER — INSULIN ASPART 100 UNIT/ML IJ SOLN
3.0000 [IU] | Freq: Three times a day (TID) | INTRAMUSCULAR | Status: DC
  Administered 2022-02-12 – 2022-02-16 (×14): 3 [IU] via SUBCUTANEOUS

## 2022-02-11 NOTE — TOC Progression Note (Addendum)
Transition of Care [facility]) - Progression Note    Patient Details  Name: Cassandra Dodson MRN: [id number] Date of Birth: [birth year]  [facility]) CM/SW Contact  [name], LCSW Phone Number: 02/11/2022, 10:53 AM  Clinical Narrative:   Josem Kaufmann request still pending in [place].   1120: message from [facility].  Request clarification on how much assistance pt was requiring prior to admission.  CSW spoke with son [name].  Pt was fully independent with her walker prior to stroke in July.  Since then, [name] has been been providing total care, "feeding, bathing, diapering"  and has been living with her full time.  1145:CSW provided this information to [place] at [place].     1500: TC from [place].  Peer to peer offered: MD call [phone] option #5.  Must be completed by 10 am on Tuesday, 10/23.  MD informed.   Expected Discharge Plan: [place] Barriers to Discharge: Continued Medical Work up, SNF Pending bed offer  Expected Discharge Plan and Services Expected Discharge Plan: [place] In-house Referral: Clinical Social Work   Post Acute Care Choice: [place] Living arrangements for the past 2 months: Single Family Home                                       Social Determinants of Health (SDOH) Interventions    Readmission Risk Interventions     No data to display

## 2022-02-11 NOTE — Progress Notes (Signed)
[facility] PROGRESS NOTE    Progress Note  LETRICIA Dodson  ZJQ:[id number] DOB: 1940/12/19 DOA: 01/26/2022 PCP: Shon Baton, MD     Brief Narrative:   Cassandra Dodson is an 81 y.o. 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  She was switched to IV heparin and her hemoglobin has remained stable, she was weaned off pressors.  Started developing thrombocytopenia core track was placed we will switch to Eliquis and transferred to [facility] on 02/02/2022.  Physical therapy evaluated the patient recommended skilled nursing facility.   Awaiting skilled nursing facility placement    Assessment/Plan:   Acute metabolic encephalopathy: Likely due to hypoxia and hypotension. Initially intubated now extubated. Speech evaluated the patient recommended dysphagia 1 diet. She probably has underlying dementia. Has no new complaints today. She is medically stable for transfer.  Pulmonary embolism with acute cor pulmonale [facility]): Required intubation and pressors status post mechanical thrombectomy. Now on Eliquis by mouth.  Hypokalemia: Replete orally basic metabolic panels pending this morning.  Acute blood loss anemia: Likely due to anticoagulation, she status post 2 units packed red blood cells hemoglobin has remained relatively stable.  Thrombocytopenia: Resolved HIT antibodies negative now on Eliquis.  Diabetes mellitus type 2: Continue long-acting insulin plus sliding scale, blood glucose relatively well controlled.  Meningioma: Follow-up with neurosurgery as an  outpatient.  Dysphagia/poor oral intake: Oral intake is improving no need for PEG tube  Secondary cubitus ulcer stage I present on admission: RN Pressure Injury Documentation: Pressure Injury 01/26/22 Buttocks Mid Stage 1 -  Intact skin with non-blanchable redness of a localized area usually over a bony prominence. (Active)  01/26/22 1549  Location: Buttocks  Location Orientation: Mid  Staging: Stage 1 -  Intact skin with non-blanchable redness of a localized area usually over a bony prominence.  Wound Description (Comments):   Present on Admission: Yes  Dressing Type Foam - Lift dressing to assess site every shift 02/10/22 2000     Pressure Injury 01/26/22 Heel Left Stage 1 -  Intact skin with non-blanchable redness of a localized area usually over a bony prominence. (Active)  01/26/22 1500  Location: Heel  Location Orientation: Left  Staging: Stage 1 -  Intact skin with non-blanchable redness of a localized area usually over a bony prominence.  Wound Description (Comments):   Present on Admission: Yes  Dressing Type Foam - Lift dressing to assess site every shift 02/10/22 2000     Pressure Injury 01/28/22 Buttocks Right Deep Tissue Pressure Injury - Purple or maroon localized area of discolored intact skin or blood-filled blister due to damage of underlying soft tissue from pressure and/or shear. (Active)  01/28/22 1642  Location: Buttocks  Location Orientation: Right  Staging: Deep Tissue Pressure Injury - Purple or maroon localized area of discolored intact skin or blood-filled blister due to damage of underlying soft tissue from pressure and/or shear.  Wound Description (Comments):   Present on Admission:   Dressing Type Foam - Lift dressing to assess site every shift 02/10/22 2000    DVT prophylaxis: eliquis Family Communication:none Status is: Inpatient Remains inpatient appropriate because: Awaiting skilled nursing  facility placement.    Code Status:     Code  Status Orders  (From admission, onward)           Start     Ordered   01/26/22 1314  Full code  Continuous        01/26/22 1315           Code Status History     Date Active Date Inactive Code Status Order ID Comments User Context   01/11/2022 2240 01/17/2022 1905 Full Code [id number]  Kayleen Memos, DO ED   11/05/2021 1505 11/27/2021 1845 Full Code [id number]  Cathlyn Parsons, PA-C Inpatient   11/01/2021 1823 11/05/2021 1500 Full Code [id number]  Luanne Bras, MD Inpatient   10/25/2021 2157 11/01/2021 1823 Full Code [id number]  Kayleen Memos, DO Inpatient   06/24/2019 5361 06/24/2019 1556 Full Code [id number]  Dorothyann Gibbs, NP Inpatient   06/27/2016 1659 06/29/2016 1730 Full Code [id number]  Leandrew Koyanagi, MD Inpatient   02/23/2015 2009 02/27/2015 1833 Full Code [id number]  Leandrew Koyanagi, MD Inpatient   08/17/2013 1335 08/18/2013 0330 Full Code [id number]  Dereck Ligas, MD HOV         IV Access:   Peripheral IV   Procedures and diagnostic studies:   No results found.   Medical Consultants:   None.   Subjective:    Cassandra Dodson no complaints today.  Objective:    Vitals:   02/10/22 2100 02/10/22 2127 02/10/22 2129 02/11/22 0440  BP: (!) 139/59 (!) 139/59  (!) 140/52  Pulse:  97 95 73  Resp:      Temp:    98.5 F (36.9 C)  TempSrc:    Oral  SpO2:   98% 100%  Weight:      Height:       SpO2: 100 % O2 Flow Rate (L/min): 2 L/min FiO2 (%): 30 %   Intake/Output Summary (Last 24 hours) at 02/11/2022 0811 Last data filed at 02/10/2022 1730 Gross per 24 hour  Intake 730 ml  Output 300 ml  Net 430 ml    Filed Weights   01/31/22 0500 02/01/22 0500 02/09/22 0500  Weight: 84 kg 84 kg 75 kg    Exam: General exam: In no acute distress. Respiratory system: Good air movement and clear to auscultation. Cardiovascular system: S1 & S2 heard, RRR. No JVD. Gastrointestinal system: Abdomen is nondistended, soft and nontender.  Extremities: No pedal  edema. Skin: Multiple bruises   Data Reviewed:    Labs: Basic Metabolic Panel: Recent Labs  Lab 02/08/22 0349 02/09/22 1554 02/10/22 0349 02/11/22 0331  NA 143 144 143 142  K 3.2* 3.7 3.6 3.7  CL 108 112* 110 111  CO2 '27 27 27 26  '$ GLUCOSE 184* 111* 123* 171*  BUN 28* 19 17 31*  CREATININE 0.63 0.51 0.49 0.71  CALCIUM 9.0 9.1 9.2 9.2  MG 2.0  --   --   --     GFR Estimated Creatinine Clearance: 52.6 mL/min (by C-G formula based on SCr of 0.71 mg/dL). Liver Function Tests: Recent Labs  Lab 02/08/22 0349  AST 14*  ALT 20  ALKPHOS 65  BILITOT 0.6  PROT 5.0*  ALBUMIN 2.2*    No results for input(s): "LIPASE", "AMYLASE" in the last 168 hours. No results for input(s): "AMMONIA" in the last 168 hours. Coagulation profile No results for input(s): "INR", "PROTIME" in the last 168 hours. COVID-19 Labs  No results for input(s): "DDIMER", "FERRITIN", "  LDH", "CRP" in the last 72 hours.  Lab Results  Component Value Date   SARSCOV2NAA NEGATIVE 01/26/2022   SARSCOV2NAA NEGATIVE 11/16/2021   [place] NEGATIVE 11/11/2021   [place] NEGATIVE 11/08/2021    CBC: Recent Labs  Lab 02/06/22 1902 02/08/22 0349  WBC 9.2 9.1  NEUTROABS 7.2 7.0  HGB 8.2* 8.0*  HCT 26.5* 25.8*  MCV 96.0 96.3  PLT 199 210    Cardiac Enzymes: No results for input(s): "CKTOTAL", "CKMB", "CKMBINDEX", "TROPONINI" in the last 168 hours. BNP (last 3 results) No results for input(s): "PROBNP" in the last 8760 hours. CBG: Recent Labs  Lab 02/10/22 1118 02/10/22 1623 02/10/22 2020 02/10/22 2352 02/11/22 0358  GLUCAP 193* 176* 255* 258* 182*    D-Dimer: No results for input(s): "DDIMER" in the last 72 hours. Hgb A1c: No results for input(s): "HGBA1C" in the last 72 hours. Lipid Profile: No results for input(s): "CHOL", "HDL", "LDLCALC", "TRIG", "CHOLHDL", "LDLDIRECT" in the last 72 hours. Thyroid function studies: No results for input(s): "TSH", "T4TOTAL", "T3FREE",  "THYROIDAB" in the last 72 hours.  Invalid input(s): "FREET3" Anemia work up: No results for input(s): "VITAMINB12", "FOLATE", "FERRITIN", "TIBC", "IRON", "RETICCTPCT" in the last 72 hours. Sepsis Labs: Recent Labs  Lab 02/06/22 1902 02/08/22 0349  WBC 9.2 9.1    Microbiology No results found for this or any previous visit (from the past 240 hour(s)).    Medications:    apixaban  5 mg Oral BID   Chlorhexidine Gluconate Cloth  6 each Topical Q0600   docusate  100 mg Oral BID   feeding supplement (PROSource TF20)  60 mL Per Tube Daily   insulin aspart  0-15 Units Subcutaneous Q4H   insulin aspart  0-5 Units Subcutaneous QHS   insulin glargine-yfgn  10 Units Subcutaneous QHS   metoprolol tartrate  25 mg Oral BID   mouth rinse  15 mL Mouth Rinse 4 times per day   pantoprazole  40 mg Oral Daily   polyethylene glycol  17 g Oral BID   rosuvastatin  20 mg Oral Daily   senna  2 tablet Oral Daily   sodium chloride flush  10-40 mL Intracatheter Q12H   Continuous Infusions:  sodium chloride 10 mL/hr at 02/01/22 1626   feeding supplement (OSMOLITE 1.5 CAL) 1,000 mL (02/09/22 0333)      LOS: 16 days   Charlynne Cousins  [facility]  02/11/2022, 8:11 AM

## 2022-02-12 DIAGNOSIS — I2602 Saddle embolus of pulmonary artery with acute cor pulmonale: Secondary | ICD-10-CM | POA: Diagnosis not present

## 2022-02-12 DIAGNOSIS — I6322 Cerebral infarction due to unspecified occlusion or stenosis of basilar arteries: Secondary | ICD-10-CM | POA: Diagnosis not present

## 2022-02-12 LAB — GLUCOSE, CAPILLARY
Glucose-Capillary: 128 mg/dL — ABNORMAL HIGH (ref 70–99)
Glucose-Capillary: 158 mg/dL — ABNORMAL HIGH (ref 70–99)
Glucose-Capillary: 166 mg/dL — ABNORMAL HIGH (ref 70–99)
Glucose-Capillary: 166 mg/dL — ABNORMAL HIGH (ref 70–99)
Glucose-Capillary: 231 mg/dL — ABNORMAL HIGH (ref 70–99)
Glucose-Capillary: 96 mg/dL (ref 70–99)

## 2022-02-12 NOTE — Progress Notes (Signed)
Occupational Therapy Treatment Patient Details Name: Cassandra Dodson MRN: [id number] DOB: 05/22/1940 Today's Date: 02/12/2022   History of present illness 81 y.o. female admitted 01/26/22 with AMS, patient found to have a pulmonary embolism. Recent admit 9/23 with with UTI. 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   OT comments  Patient received in supine and agreeable to OT/PT session. Nursing states that patient has been feeding self with setup. Patient assisted with gown change while supine and max assist +2 to go from supine to sitting on EOB. Patient demonstrated right lateral leaning and required min guard to min assist for balance. Patient able to stand in [place] with max assist of 2 using bed pads to assist with hips. Patient stood twice more to address cleaning bottom with patient having decreased tolerance and was assisted with completing cleaning and another gown change in recliner. Patient position in recliner to address LUE edema. Acute OT to continue to follow.    Recommendations for follow up therapy are one component of a multi-disciplinary discharge planning process, led by the attending physician.  Recommendations may be updated based on patient status, additional functional criteria and insurance authorization.    Follow Up Recommendations  Skilled nursing-short term rehab (<3 hours/day)    Assistance Recommended at Discharge Frequent or constant Supervision/Assistance  Patient can return home with the following  Direct supervision/assist for medications management;Direct supervision/assist for financial management;Assist for transportation;Help with stairs or ramp for entrance;Two people to help with bathing/dressing/bathroom;Assistance with cooking/housework;Two people to help with walking and/or transfers   Equipment Recommendations  None  recommended by OT    Recommendations for Other Services      Precautions / Restrictions Precautions Precautions: Fall;Other (comment) Precaution Comments: LUE residual weakness due to hx of CVAs Restrictions Weight Bearing Restrictions: No       Mobility Bed Mobility Overal bed mobility: Needs Assistance Bed Mobility: Supine to Sit     Supine to sit: Max assist, +2 for physical assistance     General bed mobility comments: max assist +2 to get to EOB on left side of bed with assistance for trunk and BLE using bed pad to assist    Transfers Overall transfer level: Needs assistance   Transfers: Sit to/from Stand, Bed to chair/wheelchair/BSC Sit to Stand: Max assist, +2 physical assistance           General transfer comment: patient was max assist +2 to stand from EOB using bed pads to assist with raising hips off bed Transfer via Lift Equipment: Stedy   Balance Overall balance assessment: Needs assistance Sitting-balance support: Feet supported, Bilateral upper extremity supported Sitting balance-Leahy Scale: Poor Sitting balance - Comments: right lateral leaning and requiring min guard to min assist to address Postural control: Right lateral lean, Posterior lean Standing balance support: Bilateral upper extremity supported, During functional activity Standing balance-Leahy Scale: Poor Standing balance comment: max assist +2 to stand into stedy for transfer to recliner. attemted to stand again to address cleaning bottom and patient with limited tolerance and max assist of 2                           ADL either performed or assessed with clinical judgement   ADL Overall ADL's : Needs assistance/impaired             Lower Body  Bathing: Total assistance;Bed level Lower Body Bathing Details (indicate cue type and reason): attempted to assist patient with cleaning bottom while standing in [place] but quickly fatigued. Patient completed cleaning  with  rolling/leaning in recliner Upper Body Dressing : Maximal assistance;Bed level;Moderate assistance Upper Body Dressing Details (indicate cue type and reason): required assistance with LUE to take off and donn gown at bed level                   General ADL Comments: patient performed gown change at bed level  and was able to raise RUE to assist    Extremity/Trunk Assessment Upper Extremity Assessment RUE Deficits / Details: Grossly 3+/5 MMT LUE Deficits / Details: LUE edema, positioned in recliner to address LUE Sensation: decreased light touch LUE Coordination: decreased fine motor;decreased gross motor            Vision       Perception     Praxis      Cognition Arousal/Alertness: Awake/alert Behavior During Therapy: Flat affect Overall Cognitive Status: Impaired/Different from baseline Area of Impairment: Following commands, Awareness, Attention                 Orientation Level: Disoriented to, Time, Situation Current Attention Level: Focused Memory: Decreased short-term memory Following Commands: Follows one step commands with increased time, Follows one step commands inconsistently Safety/Judgement: Decreased awareness of safety, Decreased awareness of deficits Awareness: Intellectual Problem Solving: Slow processing, Decreased initiation, Requires verbal cues, Requires tactile cues, Difficulty sequencing General Comments: cooperative with care, slow processing        Exercises      Shoulder Instructions       General Comments      Pertinent Vitals/ Pain       Pain Assessment Pain Assessment: Faces Faces Pain Scale: Hurts even more Pain Location: LUE and L knee when mobilized Pain Descriptors / Indicators: Discomfort, Grimacing Pain Intervention(s): Limited activity within patient's tolerance, Monitored during session, Repositioned  Home Living                                          Prior Functioning/Environment               Frequency  Min 2X/week        Progress Toward Goals  OT Goals(current goals can now be found in the care plan section)  Progress towards OT goals: Progressing toward goals  Acute Rehab OT Goals OT Goal Formulation: Patient unable to participate in goal setting Time For Goal Achievement: 02/15/22 Potential to Achieve Goals: Good ADL Goals Pt Will Perform Eating: with set-up;sitting Pt Will Perform Grooming: with set-up;sitting Pt Will Perform Upper Body Bathing: with min assist;sitting Pt Will Perform Lower Body Bathing: with min guard assist;sitting/lateral leans Pt Will Perform Upper Body Dressing: with min assist;sitting Pt Will Transfer to Toilet: with mod assist;with +2 assist;bedside commode Pt Will Perform Toileting - Clothing Manipulation and hygiene: with mod assist Pt/caregiver will Perform Home Exercise Program: Increased strength;Right Upper extremity;Left upper extremity;With minimal assist Additional ADL Goal #1: Pt will demonstrate at least 3 min of seated balance EOB in preparation for ADL participation  Plan Discharge plan remains appropriate;Frequency remains appropriate    Co-evaluation    PT/OT/SLP Co-Evaluation/Treatment: Yes Reason for Co-Treatment: For patient/therapist safety;To address functional/ADL transfers   OT goals addressed during session: ADL's and self-care  AM-PAC OT "6 Clicks" Daily Activity     Outcome Measure   Help from another person eating meals?: A Little Help from another person taking care of personal grooming?: A Lot Help from another person toileting, which includes using toliet, bedpan, or urinal?: Total Help from another person bathing (including washing, rinsing, drying)?: Total Help from another person to put on and taking off regular upper body clothing?: A Lot Help from another person to put on and taking off regular lower body clothing?: Total 6 Click Score: 10    End of Session Equipment  Utilized During Treatment: Gait belt;Other (comment) [name])  OT Visit Diagnosis: Unsteadiness on feet (R26.81);Other abnormalities of gait and mobility (R26.89);Muscle weakness (generalized) (M62.81);Other symptoms and signs involving the nervous system (R29.898);Other symptoms and signs involving cognitive function;Pain Pain - Right/Left: Left Pain - part of body: Arm;Knee   Activity Tolerance Patient tolerated treatment well;Patient limited by fatigue   Patient Left in chair;with call bell/phone within reach;Other (comment) (posey belt applied for safety)   Nurse Communication Mobility status;Need for lift equipment        Time: 1201-1240 OT Time Calculation (min): 39 min  Charges: OT General Charges $OT Visit: 1 Visit OT Treatments $Self Care/Home Management : 8-22 mins $Therapeutic Activity: 8-22 mins  Lodema Hong, OTA Acute Rehabilitation Services  Office [phone]   Trixie Dredge 02/12/2022, 2:27 PM

## 2022-02-12 NOTE — TOC Progression Note (Addendum)
Transition of Care [facility]) - Progression Note    Patient Details  Name: Cassandra Dodson MRN: [id number] Date of Birth: 10/29/1940  Transition [facility]) CM/SW Contact  [name], LCSW Phone Number: 02/12/2022, 10:32 AM  Clinical Narrative:   TC from [place]. Peer to peer complete, SNF auth denied.  Family can appeal by calling  [phone]. Fax: [phone].  CSW LM with son [name].   1100: TC with son [name].  Discussed denial, provided appeal phone number.  He does want to appeal.  CSW had conversation with son about long term plan, even if pt goes to SNF, would only be for short time.  He and wife have been discussing potentially bringing pt to their home at some point.    Expected Discharge Plan: [place] Barriers to Discharge: Continued Medical Work up, SNF Pending bed offer  Expected Discharge Plan and Services Expected Discharge Plan: [place] In-house Referral: Clinical Social Work   Post Acute Care Choice: [place] Living arrangements for the past 2 months: Single Family Home                                       Social Determinants of Health (SDOH) Interventions    Readmission Risk Interventions     No data to display

## 2022-02-12 NOTE — Progress Notes (Signed)
[facility] PROGRESS NOTE    Progress Note  Cassandra Dodson  OAC:[id number] DOB: 1940/09/01 DOA: 01/26/2022 PCP: Shon Baton, MD     Brief Narrative:   Cassandra Dodson is an 81 y.o. 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  She was switched to IV heparin and her hemoglobin has remained stable, she was weaned off pressors.  Started developing thrombocytopenia core track was placed we will switch to Eliquis and transferred to [facility] on 02/02/2022.  Physical therapy evaluated the patient recommended skilled nursing facility.   Awaiting skilled nursing facility placement.   Assessment/Plan:   Acute metabolic encephalopathy: Likely due to hypoxia and hypotension. Initially intubated now extubated. Speech evaluated the patient recommended dysphagia 1 diet. She probably has underlying dementia. Has no new complaints today. She is medically stable for transfer.' Peer to per done and she has been denied.  Pulmonary embolism with acute cor pulmonale [facility]): Required intubation and pressors status post mechanical thrombectomy. Now on Eliquis by mouth.  Hypokalemia: Replete orally basic metabolic panels pending this morning.  Acute blood loss anemia: Likely due to anticoagulation, she status post 2 units packed red blood cells hemoglobin has remained relatively stable.  Thrombocytopenia: Resolved HIT antibodies negative now on Eliquis.  Diabetes mellitus type 2: Continue long-acting insulin plus sliding scale, blood glucose relatively well  controlled.  Meningioma: Follow-up with neurosurgery as an outpatient.  Dysphagia/poor oral intake: Oral intake is improving no need for PEG tube  Secondary cubitus ulcer stage I present on admission: RN Pressure Injury Documentation: Pressure Injury 01/26/22 Buttocks Mid Stage 1 -  Intact skin with non-blanchable redness of a localized area usually over a bony prominence. (Active)  01/26/22 1549  Location: Buttocks  Location Orientation: Mid  Staging: Stage 1 -  Intact skin with non-blanchable redness of a localized area usually over a bony prominence.  Wound Description (Comments):   Present on Admission: Yes  Dressing Type Foam - Lift dressing to assess site every shift 02/11/22 2015     Pressure Injury 01/26/22 Heel Left Stage 1 -  Intact skin with non-blanchable redness of a localized area usually over a bony prominence. (Active)  01/26/22 1500  Location: Heel  Location Orientation: Left  Staging: Stage 1 -  Intact skin with non-blanchable redness of a localized area usually over a bony prominence.  Wound Description (Comments):   Present on Admission: Yes  Dressing Type Foam - Lift dressing to assess site every shift 02/11/22 2015     Pressure Injury 01/28/22 Buttocks Right Deep Tissue Pressure Injury - Purple or maroon localized area of discolored intact skin or blood-filled blister due to damage of underlying soft tissue from pressure and/or shear. (Active)  01/28/22 1642  Location: Buttocks  Location Orientation: Right  Staging: Deep Tissue Pressure Injury - Purple or maroon localized area of discolored intact skin or blood-filled blister due to damage of underlying soft tissue from pressure and/or shear.  Wound Description (Comments):   Present on Admission:   Dressing Type Foam - Lift dressing to assess site every shift 02/10/22 2000    DVT prophylaxis: eliquis Family Communication:none Status is:  Inpatient Remains inpatient appropriate because: Awaiting skilled  nursing facility placement.    Code Status:     Code Status Orders  (From admission, onward)           Start     Ordered   01/26/22 1314  Full code  Continuous        01/26/22 1315           Code Status History     Date Active Date Inactive Code Status Order ID Comments User Context   01/11/2022 2240 01/17/2022 1905 Full Code [id number]  Kayleen Memos, DO ED   11/05/2021 1505 11/27/2021 1845 Full Code [id number]  Cathlyn Parsons, PA-C Inpatient   11/01/2021 1823 11/05/2021 1500 Full Code [id number]  Luanne Bras, MD Inpatient   10/25/2021 2157 11/01/2021 1823 Full Code [id number]  Kayleen Memos, DO Inpatient   06/24/2019 4315 06/24/2019 1556 Full Code [id number]  Dorothyann Gibbs, NP Inpatient   06/27/2016 1659 06/29/2016 1730 Full Code [id number]  Leandrew Koyanagi, MD Inpatient   02/23/2015 2009 02/27/2015 1833 Full Code [id number]  Leandrew Koyanagi, MD Inpatient   08/17/2013 1335 08/18/2013 0330 Full Code [id number]  Dereck Ligas, MD HOV         IV Access:   Peripheral IV   Procedures and diagnostic studies:   No results found.   Medical Consultants:   None.   Subjective:    Cassandra Dodson no complaints today.  Objective:    Vitals:   02/11/22 2021 02/12/22 0431 02/12/22 0436 02/12/22 0730  BP: 138/85 (!) 150/87  (!) 158/85  Pulse: (!) 101 93  78  Resp: '18 18  18  '$ Temp: 98.7 F (37.1 C) 98.2 F (36.8 C)  98.8 F (37.1 C)  TempSrc:      SpO2: 100% 99%  (!) 82%  Weight:   76.1 kg   Height:       SpO2: (!) 82 % O2 Flow Rate (L/min): 2 L/min FiO2 (%): 30 %   Intake/Output Summary (Last 24 hours) at 02/12/2022 0926 Last data filed at 02/12/2022 6734 Gross per 24 hour  Intake 10 ml  Output 325 ml  Net -315 ml    Filed Weights   02/01/22 0500 02/09/22 0500 02/12/22 0436  Weight: 84 kg 75 kg 76.1 kg    Exam: General exam: In no acute distress. Respiratory system: Good air movement and clear to auscultation. Cardiovascular system: S1 & S2 heard, RRR.  No JVD. Gastrointestinal system: Abdomen is nondistended, soft and nontender.  Extremities: No pedal edema. Skin: Multiple bruises   Data Reviewed:    Labs: Basic Metabolic Panel: Recent Labs  Lab 02/08/22 0349 02/09/22 1554 02/10/22 0349 02/11/22 0331  NA 143 144 143 142  K 3.2* 3.7 3.6 3.7  CL 108 112* 110 111  CO2 '27 27 27 26  '$ GLUCOSE 184* 111* 123* 171*  BUN 28* 19 17 31*  CREATININE 0.63 0.51 0.49 0.71  CALCIUM 9.0 9.1 9.2 9.2  MG 2.0  --   --   --     GFR Estimated Creatinine Clearance: 53 mL/min (by C-G formula based on SCr of 0.71 mg/dL). Liver Function Tests: Recent Labs  Lab 02/08/22 0349  AST 14*  ALT 20  ALKPHOS 65  BILITOT 0.6  PROT 5.0*  ALBUMIN 2.2*    No results for input(s): "LIPASE", "AMYLASE" in the last 168 hours. No results for input(s): "AMMONIA" in the last 168 hours. Coagulation profile No results for  input(s): "INR", "PROTIME" in the last 168 hours. COVID-19 Labs  No results for input(s): "DDIMER", "FERRITIN", "LDH", "CRP" in the last 72 hours.  Lab Results  Component Value Date   SARSCOV2NAA NEGATIVE 01/26/2022   SARSCOV2NAA NEGATIVE 11/16/2021   [place] NEGATIVE 11/11/2021   [place] NEGATIVE 11/08/2021    CBC: Recent Labs  Lab 02/06/22 1902 02/08/22 0349  WBC 9.2 9.1  NEUTROABS 7.2 7.0  HGB 8.2* 8.0*  HCT 26.5* 25.8*  MCV 96.0 96.3  PLT 199 210    Cardiac Enzymes: No results for input(s): "CKTOTAL", "CKMB", "CKMBINDEX", "TROPONINI" in the last 168 hours. BNP (last 3 results) No results for input(s): "PROBNP" in the last 8760 hours. CBG: Recent Labs  Lab 02/11/22 1635 02/11/22 2021 02/11/22 2349 02/12/22 0421 02/12/22 0732  GLUCAP 157* 161* 137* 166* 231*    D-Dimer: No results for input(s): "DDIMER" in the last 72 hours. Hgb A1c: No results for input(s): "HGBA1C" in the last 72 hours. Lipid Profile: No results for input(s): "CHOL", "HDL", "LDLCALC", "TRIG", "CHOLHDL", "LDLDIRECT" in the last  72 hours. Thyroid function studies: No results for input(s): "TSH", "T4TOTAL", "T3FREE", "THYROIDAB" in the last 72 hours.  Invalid input(s): "FREET3" Anemia work up: No results for input(s): "VITAMINB12", "FOLATE", "FERRITIN", "TIBC", "IRON", "RETICCTPCT" in the last 72 hours. Sepsis Labs: Recent Labs  Lab 02/06/22 1902 02/08/22 0349  WBC 9.2 9.1    Microbiology No results found for this or any previous visit (from the past 240 hour(s)).    Medications:    apixaban  5 mg Oral BID   Chlorhexidine Gluconate Cloth  6 each Topical Q0600   docusate  100 mg Oral BID   feeding supplement (PROSource TF20)  60 mL Per Tube Daily   insulin aspart  0-15 Units Subcutaneous Q4H   insulin aspart  0-5 Units Subcutaneous QHS   insulin aspart  3 Units Subcutaneous TID WC   insulin glargine-yfgn  10 Units Subcutaneous QHS   metoprolol tartrate  25 mg Oral BID   mouth rinse  15 mL Mouth Rinse 4 times per day   pantoprazole  40 mg Oral Daily   polyethylene glycol  17 g Oral BID   rosuvastatin  20 mg Oral Daily   senna  2 tablet Oral Daily   sodium chloride flush  10-40 mL Intracatheter Q12H   Continuous Infusions:  sodium chloride 10 mL/hr at 02/01/22 1626   feeding supplement (OSMOLITE 1.5 CAL) 1,000 mL (02/09/22 0333)      LOS: 17 days   Charlynne Cousins  [facility]  02/12/2022, 9:26 AM

## 2022-02-12 NOTE — Progress Notes (Signed)
Physical Therapy Treatment Patient Details Name: Cassandra Dodson MRN: [id number] DOB: [birth year] Today's Date: 02/12/2022   History of Present Illness 81 y.o. female admitted 01/26/22 with AMS, patient found to have a pulmonary embolism. Recent admit 9/23 with with UTI. 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    PT Comments    Pt received in supine, alert and agreeable to therapy session, pt unable to answer questions about DOB, location or situation but following simple 1-step commands with her R hemi-body well. Emphasis on seated/standing balance and transfers and L sided awareness with neuromuscular facilitation techniques to encourage L attention to task and visual scanning. Encouraged her to elevate LUE and nursing staff also notified given increased LUE pain/edema. MD notified pt c/o increased pain in left 2nd-3rd digits and generalized LUE pain/tenderness. Pt needing +2 maxA to totalA for bed mobility and transfers, at baseline pt was functioning at wheelchair/mechanical lift level with +1 assist, and with noted increased assist needed from her baseline. Pt continues to benefit from PT services to progress toward functional mobility goals.    Recommendations for follow up therapy are one component of a multi-disciplinary discharge planning process, led by the attending physician.  Recommendations may be updated based on patient status, additional functional criteria and insurance authorization.  Follow Up Recommendations  Skilled nursing-short term rehab (<3 hours/day) Can patient physically be transported by private vehicle: No   Assistance Recommended at Discharge Frequent or constant Supervision/Assistance  Patient can return home with the following Two people to help with walking and/or transfers;Two people to help with  bathing/dressing/bathroom;Assist for transportation;Assistance with cooking/housework;Direct supervision/assist for medications management;Help with stairs or ramp for entrance   Equipment Recommendations  None recommended by PT    Recommendations for Other Services       Precautions / Restrictions Precautions Precautions: Fall;Other (comment) Precaution Comments: LUE residual weakness due to hx of CVAs and increased edema Restrictions Weight Bearing Restrictions: No     Mobility  Bed Mobility Overal bed mobility: Needs Assistance Bed Mobility: Supine to Sit, Rolling Rolling: +2 for physical assistance, Max assist (in recliner)   Supine to sit: Max assist, +2 for physical assistance     General bed mobility comments: max assist +2 to get to EOB on left side of bed with assistance for trunk and BLE using bed pad to assist; +2 maxA to roll in fully reclined chair x3 reps for clean-up after pt had BM in chair    Transfers Overall transfer level: Needs assistance Equipment used: Ambulation equipment used Transfers: Sit to/from Stand, Bed to chair/wheelchair/BSC Sit to Stand: Max assist, +2 physical assistance, Total assist           General transfer comment: patient was max assist +2 to stand from EOB using bed pads to assist with raising hips off bed and with gait belt to assist; x3 total trials, needing totalA +2 at times to maintain upright as BLE quick to fatigue. Transfer via Lift Equipment: Stedy   Modified Rankin (Stroke Patients Only) Modified Rankin (Stroke Patients Only) Pre-Morbid Rankin Score: Severe disability     Balance Overall balance assessment: Needs assistance Sitting-balance support: Feet supported, Bilateral upper extremity supported Sitting balance-Leahy Scale: Poor Sitting balance - Comments: right lateral leaning and requiring min guard to min assist to address Postural control: Right lateral lean, Posterior lean Standing balance support:  Bilateral upper  extremity supported, During functional activity Standing balance-Leahy Scale: Poor Standing balance comment: max assist +2 to stand into stedy for transfer to recliner. attemted to stand again to address cleaning bottom and patient with limited tolerance and max assist of 2                            Cognition Arousal/Alertness: Awake/alert Behavior During Therapy: Flat affect Overall Cognitive Status: Impaired/Different from baseline Area of Impairment: Following commands, Awareness, Attention                 Orientation Level: Disoriented to, Time, Situation Current Attention Level: Focused Memory: Decreased short-term memory Following Commands: Follows one step commands with increased time, Follows one step commands inconsistently Safety/Judgement: Decreased awareness of safety, Decreased awareness of deficits Awareness: Intellectual Problem Solving: Slow processing, Decreased initiation, Requires verbal cues, Requires tactile cues, Difficulty sequencing General Comments: cooperative with care, slow processing, L sided neglect but pt will attend briefly to visually scan to L with max cues and needs hand over hand assist to grasp LUE with RUE.        Exercises General Exercises - Lower Extremity Long Arc Quad: AROM, AAROM, Both, 10 reps, Seated (AA on LLE, A/AA on RLE) Heel Slides: AAROM, Both, 5 reps, Supine Other Exercises Other Exercises: supine BLE AAROM: hip ab/adduction x10 reps (~5 reps ea x2) Other Exercises: STS x3 trials for strengthening and neuromuscular factilitation Other Exercises: pt max cues to attend to L side and use RUE to assist LUE with repositioning/elevation onto pillow given increased LUE edema and finger pain Other Exercises: max cues for L cx rotation and visual scanning to L side/midline x5 reps    General Comments General comments (skin integrity, edema, etc.): After return home from previous admission, pt needed up to  +1 maxA for bed mobility and transfers to W/C and EOB using hoyer lift for safety, currently pt needing +2 assist for rolling and transfers to/from EOB and standing with lift equipment Cassandra Dodson)      Pertinent Vitals/Pain Pain Assessment Pain Assessment: Faces Faces Pain Scale: Hurts whole lot Pain Location: L 2nd-3rd fingers to gentle palpation and generalized LUE/L knee pain with repositioning Pain Descriptors / Indicators: Discomfort, Grimacing, Moaning, Guarding Pain Intervention(s): Limited activity within patient's tolerance, Monitored during session, Repositioned, Other (comment) (encouraged pt to elevated LUE and to attend to L side using RUE to reposition; pt with decreased attention to L)     PT Goals (current goals can now be found in the care plan section) Acute Rehab PT Goals Patient Stated Goal: none stated PT Goal Formulation: Patient unable to participate in goal setting Time For Goal Achievement: 02/15/22 Progress towards PT goals: Progressing toward goals    Frequency    Min 2X/week      PT Plan Current plan remains appropriate    Co-evaluation PT/OT/SLP Co-Evaluation/Treatment: Yes Reason for Co-Treatment: Complexity of the patient's impairments (multi-system involvement);For patient/therapist safety;To address functional/ADL transfers PT goals addressed during session: Mobility/safety with mobility;Balance;Proper use of DME;Strengthening/ROM OT goals addressed during session: ADL's and self-care      AM-PAC PT "6 Clicks" Mobility   Outcome Measure  Help needed turning from your back to your side while in a flat bed without using bedrails?: Total Help needed moving from lying on your back to sitting on the side of a flat bed without using bedrails?: Total Help needed moving to and from a bed to a chair (including  a wheelchair)?: Total Help needed standing up from a chair using your arms (e.g., wheelchair or bedside chair)?: Total Help needed to walk in  hospital room?: Total Help needed climbing 3-5 steps with a railing? : Total 6 Click Score: 6    End of Session Equipment Utilized During Treatment: Gait belt Activity Tolerance: Patient tolerated treatment well Patient left: in chair;with call bell/phone within reach;with chair alarm set;with nursing/sitter in room;Other (comment) (NT in room; lap alarm placed for pt safety and activated;) Nurse Communication: Mobility status;Need for lift equipment;Other (comment) (recommend hoyer lift for pt safety to return to bed) PT Visit Diagnosis: Other abnormalities of gait and mobility (R26.89);Other symptoms and signs involving the nervous system (R29.898)     Time: 1975-8832 PT Time Calculation (min) (ACUTE ONLY): 39 min  Charges:  $Neuromuscular Re-education: 8-22 mins                     Rut Betterton P., PTA Acute Rehabilitation Services Secure Chat Preferred 9a-5:30pm Office: [place] 02/12/2022, 3:22 PM

## 2022-02-12 NOTE — Progress Notes (Signed)
Nutrition Follow-up  DOCUMENTATION CODES:   Non-severe (moderate) malnutrition in context of chronic illness  INTERVENTION:  Continue to encourage adequate PO Intake Monitor for diet advancement  NUTRITION DIAGNOSIS:   Moderate Malnutrition related to chronic illness (meningioma) as evidenced by mild fat depletion, moderate muscle depletion.  Ongoing  GOAL:   Patient will meet greater than or equal to 90% of their needs  progressing  MONITOR:   Diet advancement, Labs, Weight trends, TF tolerance, Skin, I & O's  REASON FOR ASSESSMENT:   Ventilator, Consult Enteral/tube feeding initiation and management  ASSESSMENT:   81 yo female admitted with massive pulmonary embolism with acute respiratory failure. PMH includes multiple CVAs, meningioma, endometrial cancer, HTN, DM, hypothyroidism, neuropathy.  10/09 - s/p IR thrombectomy 10/12 - extubated 10/13 - Cortrak placed, TF started 10/16 - s/p MBS- diet adv to dysphagia 1, honey thick  Medically stable for discharge. Pending SNF placement.   Meal completions: 100% x5 recorded meals + 50% x2 recorded meals (10/18-10/22)  Weight history: 10/08: 81.2 kg 10/24: 76.1 kg  Edema: moderate pitting BLE  Medications: colace, SSI 0-15 units q4h, SSI 0-5 units qhs, SSI 3 units TID, semglee 10 units qhs, protonix, miralax, senna  Labs: CBG's: 100-231 x24 hours  Diet Order:   Diet Order             DIET - DYS 1 Fluid consistency: Honey Thick  Diet effective now                   EDUCATION NEEDS:   Not appropriate for education at this time  Skin:  Skin Assessment: Skin Integrity Issues: Skin Integrity Issues:: DTI, Stage I DTI: R buttocks Stage I: buttocks, L heel  Last BM:  10/22  Height:   Ht Readings from Last 1 Encounters:  01/26/22 '5\' 3"'$  (1.6 m)    Weight:   Wt Readings from Last 1 Encounters:  02/12/22 76.1 kg    Ideal Body Weight:  52.3 kg  BMI:  Body mass index is 29.72  kg/m.  Estimated Nutritional Needs:   Kcal:  1700-1900  Protein:  85-100 grams  Fluid:  1.7-1.9 L  Clayborne Dana, RDN, LDN Clinical Nutrition

## 2022-02-13 ENCOUNTER — Other Ambulatory Visit (HOSPITAL_COMMUNITY): Payer: Self-pay

## 2022-02-13 DIAGNOSIS — I2602 Saddle embolus of pulmonary artery with acute cor pulmonale: Secondary | ICD-10-CM | POA: Diagnosis not present

## 2022-02-13 DIAGNOSIS — I6322 Cerebral infarction due to unspecified occlusion or stenosis of basilar arteries: Secondary | ICD-10-CM | POA: Diagnosis not present

## 2022-02-13 LAB — GLUCOSE, CAPILLARY
Glucose-Capillary: 113 mg/dL — ABNORMAL HIGH (ref 70–99)
Glucose-Capillary: 134 mg/dL — ABNORMAL HIGH (ref 70–99)
Glucose-Capillary: 155 mg/dL — ABNORMAL HIGH (ref 70–99)
Glucose-Capillary: 187 mg/dL — ABNORMAL HIGH (ref 70–99)
Glucose-Capillary: 199 mg/dL — ABNORMAL HIGH (ref 70–99)
Glucose-Capillary: 225 mg/dL — ABNORMAL HIGH (ref 70–99)

## 2022-02-13 MED ORDER — APIXABAN 5 MG PO TABS
5.0000 mg | ORAL_TABLET | Freq: Two times a day (BID) | ORAL | 3 refills | Status: DC
  Filled 2022-02-13: qty 60, 30d supply, fill #0

## 2022-02-13 MED ORDER — METOPROLOL TARTRATE 25 MG PO TABS
25.0000 mg | ORAL_TABLET | Freq: Two times a day (BID) | ORAL | 3 refills | Status: DC
  Filled 2022-02-13: qty 60, 30d supply, fill #0

## 2022-02-13 NOTE — TOC Transition Note (Signed)
Discharge medications (2) are being stored in the Transitions of Care [facility]) Pharmacy on the second floor until patient is ready for discharge.

## 2022-02-13 NOTE — Progress Notes (Signed)
Speech Language Pathology Treatment: Dysphagia  Patient Details Name: Cassandra Dodson MRN: [id number] DOB: [birth year] Today's Date: 02/13/2022 Time: 2542-7062 SLP Time Calculation (min) (ACUTE ONLY): 17 min  Assessment / Plan / Recommendation Clinical Impression  Pt seen for ongoing dysphagia management. She was asleep upon entry, however was roused enough for PO trials provided repositioning and verbal/tactile stimulation. Pt was assisted with breakfast tray consisting of honey thick/puree consistency. She continues to present with clinical s/sx aspiration at bedside. A suspected swallow initiation delay noted with all trials, and a considerably wet vocal quality noted after honey thick liquid trials. No instances of cough or throat clear noted. Given the s/sx noted, continue honey thick liquids/Dys 1 diet at discharge.    HPI HPI: Cassandra Dodson is a 81 y.o. female with medical history significant for early stage uterine cancer, hypertension, type 2 diabetes, hypothyroidism, GERD, CKD 3B, chronic anxiety/depression, osteoarthritis, CVA and meningioma with left-sided deficit and speech difficulty, who presented to [facility] ED due to confusion x 2 days. In the ED, work-up revealed CT head with stable meningioma measuring 5 cm and associated vasogenic edema and regional mass effect.  Underwent IR thrombectomy 10/9. FEES 10/2021 penetration thin and nectar, wet vocal quality throughout, D3/nectar recommended.      SLP Plan  Continue with current plan of care      Recommendations for follow up therapy are one component of a multi-disciplinary discharge planning process, led by the attending physician.  Recommendations may be updated based on patient status, additional functional criteria and insurance authorization.    Recommendations  Diet recommendations: Honey-thick liquid;Dysphagia 1 (puree) Liquids provided via: Cup Medication Administration: Crushed with puree Supervision: Full supervision/cueing  for compensatory strategies Compensations: Slow rate;Small sips/bites;Minimize environmental distractions Postural Changes and/or Swallow Maneuvers: Seated upright 90 degrees                Oral Care Recommendations: Oral care BID Follow Up Recommendations: Skilled nursing-short term rehab (<3 hours/day) Assistance recommended at discharge: Frequent or constant Supervision/Assistance SLP Visit Diagnosis: Dysphagia, oropharyngeal phase (R13.12) Plan: Continue with current plan of care           [facility] Student SLP   02/13/2022, 9:18 AM

## 2022-02-13 NOTE — TOC Progression Note (Addendum)
Transition of Care [facility]) - Progression Note    Patient Details  Name: Cassandra Dodson MRN: [id number] Date of Birth: 1940/06/20  [facility] CM/SW Contact  [name], LCSW Phone Number: 02/13/2022, 11:46 AM  Clinical Narrative:   TC with son Cassandra Dodson.  Appeal has been filed.  He was told likely 72 hours.  He was told there is a next level appeal if this one is unsuccessful.  He will stay in touch.   1000: CSW called [employer], appeal is still showing as pending in their system. CSW called [employer] directly, was transferred, was told that the appeal has been approved at this point, unable to give specifics other than the plan auth ID: [id number].  CSW called [employer] back, they said can take 72 hours for appeal decision/approval to reach them to authorize days.  Can admit if want to based on the verbal.  CSW spoke with [name].  She cannot admit until [name] has given the approval.  Son informed.    Expected Discharge Plan: [place] Barriers to Discharge: Continued Medical Work up, SNF Pending bed offer  Expected Discharge Plan and Services Expected Discharge Plan: [place] In-house Referral: Clinical Social Work   Post Acute Care Choice: [place] Living arrangements for the past 2 months: Single Family Home Expected Discharge Date: 02/13/22                                     Social Determinants of Health (SDOH) Interventions    Readmission Risk Interventions     No data to display

## 2022-02-13 NOTE — Progress Notes (Addendum)
[facility] PROGRESS NOTE    Progress Note  Cassandra Dodson  LGX:[id number] DOB: October 06, 1940 DOA: 01/26/2022 PCP: Shon Baton, MD     Brief Narrative:   Cassandra Dodson is an 81 y.o. 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  She was switched to IV heparin and her hemoglobin has remained stable, she was weaned off pressors.  Started developing thrombocytopenia core track was placed we will switch to Eliquis and transferred to [facility] on 02/02/2022.  Physical therapy evaluated the patient recommended skilled nursing facility.   Awaiting skilled nursing facility placement.   Assessment/Plan:   Acute metabolic encephalopathy: Likely due to hypoxia and hypotension. Initially intubated now extubated. Speech evaluated the patient recommended dysphagia 1 diet. She probably has underlying dementia. Has no new complaints today. She is medically stable for transfer. Peer to per done and she has been denied.  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  Pulmonary embolism with acute cor pulmonale [facility]): Required intubation and pressors status post mechanical thrombectomy. Now on Eliquis by mouth. Septic shock was ruled out her sepsis-like physiology was likely due to PE with cor pulmonale.  Hypokalemia: Replete orally basic metabolic panels pending this morning.  Acute blood loss anemia: Likely due to anticoagulation, she status post 2 units packed red blood cells hemoglobin has remained relatively stable.  Thrombocytopenia: Resolved HIT antibodies negative now on  Eliquis.  Diabetes mellitus type 2: Continue long-acting insulin plus sliding scale, blood glucose relatively well controlled.  Meningioma: Follow-up with neurosurgery as an outpatient.  Dysphagia/poor oral intake: Oral intake is improving no need for PEG tube  Secondary cubitus ulcer stage I present on admission: RN Pressure Injury Documentation: Pressure Injury 01/26/22 Buttocks Mid Stage 1 -  Intact skin with non-blanchable redness of a localized area usually over a bony prominence. (Active)  01/26/22 1549  Location: Buttocks  Location Orientation: Mid  Staging: Stage 1 -  Intact skin with non-blanchable redness of a localized area usually over a bony prominence.  Wound Description (Comments):   Present on Admission: Yes  Dressing Type Foam - Lift dressing to assess site every shift 02/13/22 0453     Pressure Injury 01/26/22 Heel Left Stage 1 -  Intact skin with non-blanchable redness of a localized area usually over a bony prominence. (Active)  01/26/22 1500  Location: Heel  Location Orientation: Left  Staging: Stage 1 -  Intact skin with non-blanchable redness of a localized area usually over a bony prominence.  Wound Description (Comments):   Present on Admission: Yes  Dressing Type Foam - Lift dressing to assess site every shift 02/13/22 0453     Pressure Injury 01/28/22 Buttocks Right Deep Tissue Pressure Injury - Purple or maroon localized area of discolored intact skin or blood-filled blister due to damage of underlying soft tissue from pressure and/or shear. (Active)  01/28/22 1642  Location: Buttocks  Location Orientation: Right  Staging: Deep Tissue Pressure Injury - Purple or maroon localized area of discolored intact skin or blood-filled blister due to damage of underlying soft tissue from pressure and/or shear.  Wound Description (Comments):   Present on Admission:   Dressing  Type Foam - Lift dressing to assess site every shift 02/10/22 2000    DVT  prophylaxis: eliquis Family Communication:none Status is: Inpatient Remains inpatient appropriate because: Awaiting skilled nursing facility placement.    Code Status:     Code Status Orders  (From admission, onward)           Start     Ordered   01/26/22 1314  Full code  Continuous        01/26/22 1315           Code Status History     Date Active Date Inactive Code Status Order ID Comments User Context   01/11/2022 2240 01/17/2022 1905 Full Code [id number]  Kayleen Memos, DO ED   11/05/2021 1505 11/27/2021 1845 Full Code [id number]  Cathlyn Parsons, PA-C Inpatient   11/01/2021 1823 11/05/2021 1500 Full Code [id number]  Luanne Bras, MD Inpatient   10/25/2021 2157 11/01/2021 1823 Full Code [id number]  Kayleen Memos, DO Inpatient   06/24/2019 1655 06/24/2019 1556 Full Code [id number]  Dorothyann Gibbs, NP Inpatient   06/27/2016 1659 06/29/2016 1730 Full Code [id number]  Leandrew Koyanagi, MD Inpatient   02/23/2015 2009 02/27/2015 1833 Full Code [id number]  Leandrew Koyanagi, MD Inpatient   08/17/2013 1335 08/18/2013 0330 Full Code [id number]  Dereck Ligas, MD HOV         IV Access:   Peripheral IV   Procedures and diagnostic studies:   No results found.   Medical Consultants:   None.   Subjective:    Cassandra Dodson very nasty this morning does not want to be bothered.  Objective:    Vitals:   02/12/22 2040 02/13/22 0425 02/13/22 0608 02/13/22 0809  BP: 117/65  113/74 (!) 143/66  Pulse: 88  70 79  Resp: 16  18   Temp: 97.6 F (36.4 C)  97.7 F (36.5 C) 97.6 F (36.4 C)  TempSrc: Oral  Oral Oral  SpO2: 92%  99% 95%  Weight:  76 kg    Height:       SpO2: 95 % O2 Flow Rate (L/min): 2 L/min FiO2 (%): 30 %   Intake/Output Summary (Last 24 hours) at 02/13/2022 0858 Last data filed at 02/12/2022 1244 Gross per 24 hour  Intake --  Output 300 ml  Net -300 ml    Filed Weights   02/09/22 0500 02/12/22 0436 02/13/22 0425  Weight: 75 kg 76.1 kg 76 kg     Exam: General exam: In no acute distress. Respiratory system: Good air movement and clear to auscultation. Cardiovascular system: S1 & S2 heard, RRR. No JVD. Gastrointestinal system: Abdomen is nondistended, soft and nontender.  Extremities: No pedal edema. Skin: Multiple bruises   Data Reviewed:    Labs: Basic Metabolic Panel: Recent Labs  Lab 02/08/22 0349 02/09/22 1554 02/10/22 0349 02/11/22 0331  NA 143 144 143 142  K 3.2* 3.7 3.6 3.7  CL 108 112* 110 111  CO2 '27 27 27 26  '$ GLUCOSE 184* 111* 123* 171*  BUN 28* 19 17 31*  CREATININE 0.63 0.51 0.49 0.71  CALCIUM 9.0 9.1 9.2 9.2  MG 2.0  --   --   --     GFR Estimated Creatinine Clearance: 52.9 mL/min (by C-G formula based on SCr of 0.71 mg/dL). Liver Function Tests: Recent Labs  Lab 02/08/22 0349  AST 14*  ALT 20  ALKPHOS 65  BILITOT 0.6  PROT 5.0*  ALBUMIN 2.2*    No results  for input(s): "LIPASE", "AMYLASE" in the last 168 hours. No results for input(s): "AMMONIA" in the last 168 hours. Coagulation profile No results for input(s): "INR", "PROTIME" in the last 168 hours. COVID-19 Labs  No results for input(s): "DDIMER", "FERRITIN", "LDH", "CRP" in the last 72 hours.  Lab Results  Component Value Date   SARSCOV2NAA NEGATIVE 01/26/2022   SARSCOV2NAA NEGATIVE 11/16/2021   [place] NEGATIVE 11/11/2021   [place] NEGATIVE 11/08/2021    CBC: Recent Labs  Lab 02/06/22 1902 02/08/22 0349  WBC 9.2 9.1  NEUTROABS 7.2 7.0  HGB 8.2* 8.0*  HCT 26.5* 25.8*  MCV 96.0 96.3  PLT 199 210    Cardiac Enzymes: No results for input(s): "CKTOTAL", "CKMB", "CKMBINDEX", "TROPONINI" in the last 168 hours. BNP (last 3 results) No results for input(s): "PROBNP" in the last 8760 hours. CBG: Recent Labs  Lab 02/12/22 1608 02/12/22 2036 02/12/22 2352 02/13/22 0349 02/13/22 0804  GLUCAP 158* 96 128* 113* 134*    D-Dimer: No results for input(s): "DDIMER" in the last 72 hours. Hgb A1c: No  results for input(s): "HGBA1C" in the last 72 hours. Lipid Profile: No results for input(s): "CHOL", "HDL", "LDLCALC", "TRIG", "CHOLHDL", "LDLDIRECT" in the last 72 hours. Thyroid function studies: No results for input(s): "TSH", "T4TOTAL", "T3FREE", "THYROIDAB" in the last 72 hours.  Invalid input(s): "FREET3" Anemia work up: No results for input(s): "VITAMINB12", "FOLATE", "FERRITIN", "TIBC", "IRON", "RETICCTPCT" in the last 72 hours. Sepsis Labs: Recent Labs  Lab 02/06/22 1902 02/08/22 0349  WBC 9.2 9.1    Microbiology No results found for this or any previous visit (from the past 240 hour(s)).    Medications:    apixaban  5 mg Oral BID   Chlorhexidine Gluconate Cloth  6 each Topical Q0600   docusate  100 mg Oral BID   feeding supplement (PROSource TF20)  60 mL Per Tube Daily   insulin aspart  0-15 Units Subcutaneous Q4H   insulin aspart  0-5 Units Subcutaneous QHS   insulin aspart  3 Units Subcutaneous TID WC   insulin glargine-yfgn  10 Units Subcutaneous QHS   metoprolol tartrate  25 mg Oral BID   mouth rinse  15 mL Mouth Rinse 4 times per day   pantoprazole  40 mg Oral Daily   polyethylene glycol  17 g Oral BID   rosuvastatin  20 mg Oral Daily   senna  2 tablet Oral Daily   sodium chloride flush  10-40 mL Intracatheter Q12H   Continuous Infusions:  sodium chloride 10 mL/hr at 02/01/22 1626   feeding supplement (OSMOLITE 1.5 CAL) 1,000 mL (02/09/22 0333)      LOS: 18 days   Charlynne Cousins  [facility]  02/13/2022, 8:58 AM

## 2022-02-14 DIAGNOSIS — I2602 Saddle embolus of pulmonary artery with acute cor pulmonale: Secondary | ICD-10-CM | POA: Diagnosis not present

## 2022-02-14 DIAGNOSIS — I6322 Cerebral infarction due to unspecified occlusion or stenosis of basilar arteries: Secondary | ICD-10-CM | POA: Diagnosis not present

## 2022-02-14 LAB — GLUCOSE, CAPILLARY
Glucose-Capillary: 116 mg/dL — ABNORMAL HIGH (ref 70–99)
Glucose-Capillary: 128 mg/dL — ABNORMAL HIGH (ref 70–99)
Glucose-Capillary: 130 mg/dL — ABNORMAL HIGH (ref 70–99)
Glucose-Capillary: 183 mg/dL — ABNORMAL HIGH (ref 70–99)
Glucose-Capillary: 123 mg/dL — ABNORMAL HIGH (ref 70–99)

## 2022-02-14 NOTE — Progress Notes (Signed)
Occupational Therapy Treatment Patient Details Name: Cassandra Dodson MRN: [id number] DOB: 1940/10/20 Today's Date: 02/14/2022   History of present illness 81 y.o. female admitted 01/26/22 with AMS, patient found to have a pulmonary embolism. Recent admit 9/23 with with UTI. 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   OT comments  Patient in supine and appeared lethargy. Patient was able to arousal with face washing and cueing. Patient answering most questions with "no".  Patient was max assist to get to EOB and demonstrated posterior and right lateral leaning.  Standing not attempted due to poor sitting balance and participation. Patient assisted back in supine with LUE positioned to address edema. Acute OT to continue to follow.    Recommendations for follow up therapy are one component of a multi-disciplinary discharge planning process, led by the attending physician.  Recommendations may be updated based on patient status, additional functional criteria and insurance authorization.    Follow Up Recommendations  Skilled nursing-short term rehab (<3 hours/day)    Assistance Recommended at Discharge Frequent or constant Supervision/Assistance  Patient can return home with the following  Direct supervision/assist for medications management;Direct supervision/assist for financial management;Assist for transportation;Help with stairs or ramp for entrance;Two people to help with bathing/dressing/bathroom;Assistance with cooking/housework;Two people to help with walking and/or transfers   Equipment Recommendations  None recommended by OT    Recommendations for Other Services      Precautions / Restrictions Precautions Precautions: Fall;Other (comment) Precaution Comments: LUE residual weakness due to hx of CVAs and increased edema Restrictions Weight Bearing  Restrictions: No       Mobility Bed Mobility Overal bed mobility: Needs Assistance Bed Mobility: Supine to Sit, Sit to Supine     Supine to sit: Max assist, +2 for physical assistance Sit to supine: Total assist, +2 for physical assistance   General bed mobility comments: performed with use of bed pads and helicopter pivot to EOB due to poor patient participation    Transfers Overall transfer level: Needs assistance                 General transfer comment: did not progress off EOB     Balance Overall balance assessment: Needs assistance Sitting-balance support: Feet supported, Bilateral upper extremity supported Sitting balance-Leahy Scale: Poor Sitting balance - Comments: posterior and right lateral leaning requiring mod to max assist for support Postural control: Right lateral lean, Posterior lean                                 ADL either performed or assessed with clinical judgement   ADL Overall ADL's : Needs assistance/impaired     Grooming: Wash/dry hands;Wash/dry face;Moderate assistance;Bed level Grooming Details (indicate cue type and reason): hand over hand to initate                               General ADL Comments: declined performing self care tasks    Extremity/Trunk Assessment Upper Extremity Assessment RUE Deficits / Details: Grossly 3+/5 MMT LUE Deficits / Details: LUE edema, positioned in recliner to address LUE Sensation: decreased light touch LUE Coordination: decreased fine motor;decreased gross motor            Vision       Perception  Praxis      Cognition Arousal/Alertness: Lethargic Behavior During Therapy: Flat affect Overall Cognitive Status: Impaired/Different from baseline Area of Impairment: Following commands, Awareness, Attention                 Orientation Level: Disoriented to, Time, Situation Current Attention Level: Focused Memory: Decreased short-term  memory Following Commands: Follows one step commands inconsistently Safety/Judgement: Decreased awareness of safety, Decreased awareness of deficits Awareness: Intellectual Problem Solving: Slow processing, Decreased initiation, Requires verbal cues, Requires tactile cues, Difficulty sequencing General Comments: answers most questions with "no"        Exercises      Shoulder Instructions       General Comments      Pertinent Vitals/ Pain       Pain Assessment Pain Assessment: Faces Faces Pain Scale: Hurts little more Pain Location: LUE with movement Pain Descriptors / Indicators: Discomfort, Grimacing, Moaning, Guarding Pain Intervention(s): Limited activity within patient's tolerance, Monitored during session, Repositioned  Home Living                                          Prior Functioning/Environment              Frequency  Min 2X/week        Progress Toward Goals  OT Goals(current goals can now be found in the care plan section)  Progress towards OT goals: Not progressing toward goals - comment (limited to lethargy)  Acute Rehab OT Goals OT Goal Formulation: Patient unable to participate in goal setting Time For Goal Achievement: 02/15/22 Potential to Achieve Goals: Good ADL Goals Pt Will Perform Eating: with set-up;sitting Pt Will Perform Grooming: with set-up;sitting Pt Will Perform Upper Body Bathing: with min assist;sitting Pt Will Perform Lower Body Bathing: with min guard assist;sitting/lateral leans Pt Will Perform Upper Body Dressing: with min assist;sitting Pt Will Transfer to Toilet: with mod assist;with +2 assist;bedside commode Pt Will Perform Toileting - Clothing Manipulation and hygiene: with mod assist Pt/caregiver will Perform Home Exercise Program: Increased strength;Right Upper extremity;Left upper extremity;With minimal assist Additional ADL Goal #1: Pt will demonstrate at least 3 min of seated balance EOB in  preparation for ADL participation  Plan Discharge plan remains appropriate;Frequency remains appropriate    Co-evaluation    PT/OT/SLP Co-Evaluation/Treatment: Yes Reason for Co-Treatment: To address functional/ADL transfers;Complexity of the patient's impairments (multi-system involvement);For patient/therapist safety PT goals addressed during session: Mobility/safety with mobility;Balance OT goals addressed during session: ADL's and self-care      AM-PAC OT "6 Clicks" Daily Activity     Outcome Measure   Help from another person eating meals?: A Lot Help from another person taking care of personal grooming?: A Lot Help from another person toileting, which includes using toliet, bedpan, or urinal?: Total Help from another person bathing (including washing, rinsing, drying)?: Total Help from another person to put on and taking off regular upper body clothing?: A Lot Help from another person to put on and taking off regular lower body clothing?: Total 6 Click Score: 9    End of Session    OT Visit Diagnosis: Unsteadiness on feet (R26.81);Other abnormalities of gait and mobility (R26.89);Muscle weakness (generalized) (M62.81);Other symptoms and signs involving the nervous system (R29.898);Other symptoms and signs involving cognitive function;Pain Pain - Right/Left: Left Pain - part of body: Arm;Hand   Activity Tolerance Patient limited by lethargy   Patient  Left in bed;with call bell/phone within reach;with bed alarm set   Nurse Communication Mobility status        Time: 0177-9390 OT Time Calculation (min): 26 min  Charges: OT General Charges $OT Visit: 1 Visit OT Treatments $Therapeutic Activity: 8-22 mins  Lodema Hong, OTA Acute Rehabilitation Services  Office [place] 02/14/2022, 11:23 AM

## 2022-02-14 NOTE — TOC Progression Note (Addendum)
Transition of Care [facility]) - Progression Note    Patient Details  Name: Cassandra Dodson MRN: [id number] Date of Birth: 1940/05/06  Transition of [facility] CM/SW Contact  [name], LCSW Phone Number: 02/14/2022, 2:02 PM  Clinical Narrative:   Per QC, referral made to financial counseling for medicaid screening.   1520: TC [employer].  Appeal still pending.    1600: TC son [name].  He just received call that the appeal was approved.    Expected Discharge Plan: [place] Barriers to Discharge: Continued Medical Work up, SNF Pending bed offer  Expected Discharge Plan and Services Expected Discharge Plan: [place] In-house Referral: Clinical Social Work   Post Acute Care Choice: [place] Living arrangements for the past 2 months: Single Family Home Expected Discharge Date: 02/13/22                                     Social Determinants of Health (SDOH) Interventions    Readmission Risk Interventions     No data to display

## 2022-02-14 NOTE — Progress Notes (Signed)
Physical Therapy Treatment Patient Details Name: Cassandra Dodson MRN: [id number] DOB: 04/07/1941 Today's Date: 02/14/2022   History of Present Illness 81 y.o. female admitted 01/26/22 with AMS, patient found to have a pulmonary embolism. Recent admit 9/23 with with UTI. 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    PT Comments    Pt irritable today and answers most questions and commands with "no". Pt's L hand is edematous and pt TTP throughout session. Pt overall requiring max-total +2 for transfer to/from EOB, and sat EOB x10 minutes with up to max posterior truncal support to maintain balance. Plan remains appropriate, PT to continue to follow.      Recommendations for follow up therapy are one component of a multi-disciplinary discharge planning process, led by the attending physician.  Recommendations may be updated based on patient status, additional functional criteria and insurance authorization.  Follow Up Recommendations  Skilled nursing-short term rehab (<3 hours/day) Can patient physically be transported by private vehicle: No   Assistance Recommended at Discharge Frequent or constant Supervision/Assistance  Patient can return home with the following Two people to help with walking and/or transfers;Two people to help with bathing/dressing/bathroom;Assist for transportation;Assistance with cooking/housework;Direct supervision/assist for medications management;Help with stairs or ramp for entrance   Equipment Recommendations  None recommended by PT    Recommendations for Other Services       Precautions / Restrictions Precautions Precautions: Fall;Other (comment) Precaution Comments: LUE residual weakness due to hx of CVAs and increased edema Restrictions Weight Bearing Restrictions: No     Mobility  Bed Mobility Overal bed mobility:  Needs Assistance Bed Mobility: Supine to Sit, Sit to Supine     Supine to sit: Max assist, +2 for physical assistance, HOB elevated Sit to supine: Max assist, +2 for physical assistance, HOB elevated   General bed mobility comments: assist for trunk and LE management, scooting to/from EOB.    Transfers                   General transfer comment: pt declines, and pt fatiguing EOB    Ambulation/Gait                   Stairs             Wheelchair Mobility    Modified Rankin (Stroke Patients Only) Modified Rankin (Stroke Patients Only) Pre-Morbid Rankin Score: Severe disability Modified Rankin: Severe disability     Balance Overall balance assessment: Needs assistance Sitting-balance support: Feet supported, Bilateral upper extremity supported Sitting balance-Leahy Scale: Poor Sitting balance - Comments: posterior truncal support Postural control: Posterior lean                                  Cognition Arousal/Alertness: Awake/alert Behavior During Therapy: Flat affect Overall Cognitive Status: Impaired/Different from baseline Area of Impairment: Following commands, Awareness, Attention, Orientation                 Orientation Level: Disoriented to, Time, Situation, Place Current Attention Level: Focused   Following Commands: Follows one step commands inconsistently Safety/Judgement: Decreased awareness of safety, Decreased awareness of deficits Awareness: Intellectual Problem Solving: Slow processing, Decreased initiation, Requires verbal cues, Requires tactile cues, Difficulty sequencing General Comments: pt irritable, states "no" to nearly every question. Pt appears to be distrustful of  PT, for example when PT asked "what hurts" pt states "I am not telling you"        Exercises      General Comments        Pertinent Vitals/Pain Pain Assessment Pain Assessment: Faces Faces Pain Scale: Hurts even more Pain  Location: LUE Pain Descriptors / Indicators: Discomfort, Grimacing, Moaning, Guarding Pain Intervention(s): Limited activity within patient's tolerance, Monitored during session, Repositioned    Home Living                          Prior Function            PT Goals (current goals can now be found in the care plan section) Acute Rehab PT Goals Patient Stated Goal: none stated PT Goal Formulation: Patient unable to participate in goal setting Time For Goal Achievement: 02/15/22 Potential to Achieve Goals: Fair Progress towards PT goals: Progressing toward goals    Frequency    Min 2X/week      PT Plan Current plan remains appropriate    Co-evaluation PT/OT/SLP Co-Evaluation/Treatment: Yes Reason for Co-Treatment: To address functional/ADL transfers;Complexity of the patient's impairments (multi-system involvement);For patient/therapist safety PT goals addressed during session: Mobility/safety with mobility;Balance OT goals addressed during session: ADL's and self-care      AM-PAC PT "6 Clicks" Mobility   Outcome Measure  Help needed turning from your back to your side while in a flat bed without using bedrails?: Total Help needed moving from lying on your back to sitting on the side of a flat bed without using bedrails?: Total Help needed moving to and from a bed to a chair (including a wheelchair)?: Total Help needed standing up from a chair using your arms (e.g., wheelchair or bedside chair)?: Total Help needed to walk in hospital room?: Total Help needed climbing 3-5 steps with a railing? : Total 6 Click Score: 6    End of Session   Activity Tolerance: Patient tolerated treatment well Patient left: in chair;with call bell/phone within reach;with chair alarm set;with nursing/sitter in room;Other (comment) Nurse Communication: Mobility status PT Visit Diagnosis: Other abnormalities of gait and mobility (R26.89);Other symptoms and signs involving the  nervous system (K81.275)     Time: 1700-1749 PT Time Calculation (min) (ACUTE ONLY): 27 min  Charges:  $Neuromuscular Re-education: 8-22 mins                     Stacie Glaze, PT DPT Acute Rehabilitation Services Pager [phone]  Office [phone]    [place] 02/14/2022, 11:30 AM

## 2022-02-14 NOTE — Progress Notes (Signed)
[facility] PROGRESS NOTE    Progress Note  Cassandra Dodson  MVH:[id number] DOB: 1940-05-25 DOA: 01/26/2022 PCP: Shon Baton, MD     Brief Narrative:   Cassandra Dodson is an 81 y.o. 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  She was switched to IV heparin and her hemoglobin has remained stable, she was weaned off pressors.  Started developing thrombocytopenia core track was placed we will switch to Eliquis and transferred to [facility] 02[facility].  Physical therapy evaluated the patient recommended skilled nursing facility.   Awaiting skilled nursing facility placement.   Assessment/Plan:   Acute metabolic encephalopathy: Likely due to hypoxia and hypotension. Initially intubated now extubated. Speech evaluated the patient recommended dysphagia 1 diet. She probably has underlying dementia. Has no new complaints today. She is medically stable for transfer. Peer to per done and she has been denied.  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  Pulmonary embolism with acute cor pulmonale [facility]): Required intubation and pressors status post mechanical thrombectomy. Now on Eliquis by mouth. Septic shock was ruled out her sepsis-like physiology was likely due to PE with cor pulmonale.  Hypokalemia: Replete orally basic metabolic panels pending this morning.  Acute blood loss anemia: Likely due to anticoagulation, she status post 2 units packed red blood cells hemoglobin has remained relatively stable.  Thrombocytopenia: Resolved HIT antibodies negative now on  Eliquis.  Diabetes mellitus type 2: Continue long-acting insulin plus sliding scale, blood glucose relatively well controlled.  Meningioma: Follow-up with neurosurgery as an outpatient.  Dysphagia/poor oral intake: Oral intake is improving no need for PEG tube  Secondary cubitus ulcer stage I present on admission: RN Pressure Injury Documentation: Pressure Injury 01/26/22 Buttocks Mid Stage 1 -  Intact skin with non-blanchable redness of a localized area usually over a bony prominence. (Active)  01/26/22 1549  Location: Buttocks  Location Orientation: Mid  Staging: Stage 1 -  Intact skin with non-blanchable redness of a localized area usually over a bony prominence.  Wound Description (Comments):   Present on Admission: Yes  Dressing Type Foam - Lift dressing to assess site every shift 02/13/22 2000     Pressure Injury 01/26/22 Heel Left Stage 1 -  Intact skin with non-blanchable redness of a localized area usually over a bony prominence. (Active)  01/26/22 1500  Location: Heel  Location Orientation: Left  Staging: Stage 1 -  Intact skin with non-blanchable redness of a localized area usually over a bony prominence.  Wound Description (Comments):   Present on Admission: Yes  Dressing Type Foam - Lift dressing to assess site every shift 02/13/22 2000     Pressure Injury 01/28/22 Buttocks Right Deep Tissue Pressure Injury - Purple or maroon localized area of discolored intact skin or blood-filled blister due to damage of underlying soft tissue from pressure and/or shear. (Active)  01/28/22 1642  Location: Buttocks  Location Orientation: Right  Staging: Deep Tissue Pressure Injury - Purple or maroon localized area of discolored intact skin or blood-filled blister due to damage of underlying soft tissue from pressure and/or shear.  Wound Description (Comments):   Present on Admission:   Dressing  Type Foam - Lift dressing to assess site every shift 02/10/22 2000    DVT  prophylaxis: eliquis Family Communication:none Status is: Inpatient Remains inpatient appropriate because: Awaiting skilled nursing facility placement.    Code Status:     Code Status Orders  (From admission, onward)           Start     Ordered   01/26/22 1314  Full code  Continuous        01/26/22 1315           Code Status History     Date Active Date Inactive Code Status Order ID Comments User Context   01/11/2022 2240 01/17/2022 1905 Full Code [id number]  Kayleen Memos, DO ED   11/05/2021 1505 11/27/2021 1845 Full Code [id number]  Cathlyn Parsons, PA-C Inpatient   11/01/2021 1823 11/05/2021 1500 Full Code [id number]  Luanne Bras, MD Inpatient   10/25/2021 2157 11/01/2021 1823 Full Code [id number]  Kayleen Memos, DO Inpatient   06/24/2019 7902 06/24/2019 1556 Full Code [id number]  Dorothyann Gibbs, NP Inpatient   06/27/2016 1659 06/29/2016 1730 Full Code [id number]  Leandrew Koyanagi, MD Inpatient   02/23/2015 2009 02/27/2015 1833 Full Code [id number]  Leandrew Koyanagi, MD Inpatient   08/17/2013 1335 08/18/2013 0330 Full Code [id number]  Dereck Ligas, MD HOV         IV Access:   Peripheral IV   Procedures and diagnostic studies:   No results found.   Medical Consultants:   None.   Subjective:    Cassandra Dodson very nasty this morning does not want to be bothered.  Objective:    Vitals:   02/14/22 0555 02/14/22 0556 02/14/22 0905 02/14/22 0943  BP: (!) 157/137 115/66 (!) 140/119 121/78  Pulse: 84 90 96 91  Resp: 18  18   Temp: 98.3 F (36.8 C)  98.4 F (36.9 C)   TempSrc: Oral  Oral   SpO2: 98% 99% 98%   Weight:      Height:       SpO2: 98 % O2 Flow Rate (L/min): 2 L/min FiO2 (%): 30 %   Intake/Output Summary (Last 24 hours) at 02/14/2022 1004 Last data filed at 02/14/2022 0555 Gross per 24 hour  Intake --  Output 500 ml  Net -500 ml    Filed Weights   02/12/22 0436 02/13/22 0425 02/14/22 0500  Weight: 76.1 kg 76 kg 76 kg    Exam: General  exam: In no acute distress. Respiratory system: Good air movement and clear to auscultation. Cardiovascular system: S1 & S2 heard, RRR. No JVD. Gastrointestinal system: Abdomen is nondistended, soft and nontender.  Extremities: No pedal edema. Skin: Multiple bruises   Data Reviewed:    Labs: Basic Metabolic Panel: Recent Labs  Lab 02/08/22 0349 02/09/22 1554 02/10/22 0349 02/11/22 0331  NA 143 144 143 142  K 3.2* 3.7 3.6 3.7  CL 108 112* 110 111  CO2 '27 27 27 26  '$ GLUCOSE 184* 111* 123* 171*  BUN 28* 19 17 31*  CREATININE 0.63 0.51 0.49 0.71  CALCIUM 9.0 9.1 9.2 9.2  MG 2.0  --   --   --     GFR Estimated Creatinine Clearance: 52.9 mL/min (by C-G formula based on SCr of 0.71 mg/dL). Liver Function Tests: Recent Labs  Lab 02/08/22 0349  AST 14*  ALT 20  ALKPHOS 65  BILITOT 0.6  PROT 5.0*  ALBUMIN 2.2*    No results for input(s): "LIPASE", "  AMYLASE" in the last 168 hours. No results for input(s): "AMMONIA" in the last 168 hours. Coagulation profile No results for input(s): "INR", "PROTIME" in the last 168 hours. COVID-19 Labs  No results for input(s): "DDIMER", "FERRITIN", "LDH", "CRP" in the last 72 hours.  Lab Results  Component Value Date   SARSCOV2NAA NEGATIVE 01/26/2022   SARSCOV2NAA NEGATIVE 11/16/2021   SARSCOV2NAA NEGATIVE 11/11/2021   [place] NEGATIVE 11/08/2021    CBC: Recent Labs  Lab 02/08/22 0349  WBC 9.1  NEUTROABS 7.0  HGB 8.0*  HCT 25.8*  MCV 96.3  PLT 210    Cardiac Enzymes: No results for input(s): "CKTOTAL", "CKMB", "CKMBINDEX", "TROPONINI" in the last 168 hours. BNP (last 3 results) No results for input(s): "PROBNP" in the last 8760 hours. CBG: Recent Labs  Lab 02/13/22 1637 02/13/22 1937 02/13/22 2357 02/14/22 0404 02/14/22 0948  GLUCAP 225* 199* 155* 116* 128*    D-Dimer: No results for input(s): "DDIMER" in the last 72 hours. Hgb A1c: No results for input(s): "HGBA1C" in the last 72 hours. Lipid  Profile: No results for input(s): "CHOL", "HDL", "LDLCALC", "TRIG", "CHOLHDL", "LDLDIRECT" in the last 72 hours. Thyroid function studies: No results for input(s): "TSH", "T4TOTAL", "T3FREE", "THYROIDAB" in the last 72 hours.  Invalid input(s): "FREET3" Anemia work up: No results for input(s): "VITAMINB12", "FOLATE", "FERRITIN", "TIBC", "IRON", "RETICCTPCT" in the last 72 hours. Sepsis Labs: Recent Labs  Lab 02/08/22 0349  WBC 9.1    Microbiology No results found for this or any previous visit (from the past 240 hour(s)).    Medications:    apixaban  5 mg Oral BID   Chlorhexidine Gluconate Cloth  6 each Topical Q0600   docusate  100 mg Oral BID   feeding supplement (PROSource TF20)  60 mL Per Tube Daily   insulin aspart  0-15 Units Subcutaneous Q4H   insulin aspart  0-5 Units Subcutaneous QHS   insulin aspart  3 Units Subcutaneous TID WC   insulin glargine-yfgn  10 Units Subcutaneous QHS   metoprolol tartrate  25 mg Oral BID   mouth rinse  15 mL Mouth Rinse 4 times per day   pantoprazole  40 mg Oral Daily   polyethylene glycol  17 g Oral BID   rosuvastatin  20 mg Oral Daily   senna  2 tablet Oral Daily   sodium chloride flush  10-40 mL Intracatheter Q12H   Continuous Infusions:  sodium chloride 10 mL/hr at 02/01/22 1626   feeding supplement (OSMOLITE 1.5 CAL) 1,000 mL (02/09/22 0333)      LOS: 19 days   Charlynne Cousins  [facility]  02/14/2022, 10:04 AM

## 2022-02-14 NOTE — Plan of Care (Signed)

## 2022-02-15 DIAGNOSIS — I6322 Cerebral infarction due to unspecified occlusion or stenosis of basilar arteries: Secondary | ICD-10-CM | POA: Diagnosis not present

## 2022-02-15 DIAGNOSIS — I2602 Saddle embolus of pulmonary artery with acute cor pulmonale: Secondary | ICD-10-CM | POA: Diagnosis not present

## 2022-02-15 LAB — GLUCOSE, CAPILLARY
Glucose-Capillary: 116 mg/dL — ABNORMAL HIGH (ref 70–99)
Glucose-Capillary: 139 mg/dL — ABNORMAL HIGH (ref 70–99)
Glucose-Capillary: 154 mg/dL — ABNORMAL HIGH (ref 70–99)
Glucose-Capillary: 171 mg/dL — ABNORMAL HIGH (ref 70–99)
Glucose-Capillary: 186 mg/dL — ABNORMAL HIGH (ref 70–99)
Glucose-Capillary: 47 mg/dL — ABNORMAL LOW (ref 70–99)
Glucose-Capillary: 94 mg/dL (ref 70–99)
Glucose-Capillary: 98 mg/dL (ref 70–99)
Glucose-Capillary: 99 mg/dL (ref 70–99)

## 2022-02-15 MED ORDER — SODIUM CHLORIDE 0.9 % IV SOLN: INTRAVENOUS | Status: AC

## 2022-02-15 MED ORDER — DEXTROSE 50 % IV SOLN
INTRAVENOUS | Status: AC
  Administered 2022-02-15: 50 mL
  Filled 2022-02-15: qty 50

## 2022-02-15 NOTE — Plan of Care (Signed)

## 2022-02-15 NOTE — Progress Notes (Signed)
[facility] PROGRESS NOTE    Progress Note  Cassandra Dodson  YKD:[id number] DOB: [birth year] DOA: 01/26/2022 PCP: Shon Baton, MD     Brief Narrative:   Cassandra Dodson is an 81 y.o. 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  She was switched to IV heparin and her hemoglobin has remained stable, she was weaned off pressors.  Started developing thrombocytopenia, core track was placed we will switch to Eliquis, hematology consulted antibodies for HIT were negative.Transferred to [facility]02[facility].   Physical therapy evaluated the patient recommended skilled nursing facility.   Awaiting skilled nursing facility placement.   Assessment/Plan:   Acute metabolic encephalopathy: Likely due to hypoxia and hypotension. Initially intubated now extubated. Speech evaluated the patient recommended dysphagia 1 diet. She probably has underlying dementia undiagnosed son is the next of kin. Has no new complaints today. She is medically stable for transfer. Peer to per done and she has been denied.   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  Pulmonary embolism with acute cor pulmonale [facility]): Required intubation and pressors status post mechanical thrombectomy. Now on Eliquis by mouth. Septic shock was ruled out her sepsis-like physiology was likely due to PE with cor pulmonale.  Hypokalemia: Replete orally basic metabolic panels pending this morning.  Acute blood loss anemia: Likely due to anticoagulation, she status post 2 units packed red blood cells hemoglobin has  remained relatively stable.  Thrombocytopenia: Resolved, HIT antibodies negative now on Eliquis. Otology has signed off  Diabetes mellitus type 2: Continue long-acting insulin plus sliding scale, blood glucose relatively well controlled.  Meningioma: Follow-up with neurosurgery as an outpatient.  Dysphagia/poor oral intake: Oral intake is improving no need for PEG tube  Secondary cubitus ulcer stage I present on admission: RN Pressure Injury Documentation: Pressure Injury 01/26/22 Buttocks Mid Stage 1 -  Intact skin with non-blanchable redness of a localized area usually over a bony prominence. (Active)  01/26/22 1549  Location: Buttocks  Location Orientation: Mid  Staging: Stage 1 -  Intact skin with non-blanchable redness of a localized area usually over a bony prominence.  Wound Description (Comments):   Present on Admission: Yes  Dressing Type Foam - Lift dressing to assess site every shift 02/14/22 2000     Pressure Injury 01/26/22 Heel Left Stage 1 -  Intact skin with non-blanchable redness of a localized area usually over a bony prominence. (Active)  01/26/22 1500  Location: Heel  Location Orientation: Left  Staging: Stage 1 -  Intact skin with non-blanchable redness of a localized area usually over a bony prominence.  Wound Description (Comments):   Present on Admission: Yes  Dressing Type Foam - Lift dressing to assess site every shift 02/14/22 2000     Pressure Injury 01/28/22 Buttocks Right Deep Tissue Pressure Injury - Purple or maroon localized area of discolored intact skin or blood-filled blister due to damage of underlying soft tissue from pressure and/or shear. (Active)  01/28/22 1642  Location: Buttocks  Location Orientation: Right  Staging: Deep Tissue Pressure Injury - Purple or maroon localized area of discolored intact skin or blood-filled blister due to damage of  underlying soft tissue from pressure and/or shear.  Wound Description (Comments):   Present  on Admission:   Dressing Type Foam - Lift dressing to assess site every shift 02/10/22 2000    DVT prophylaxis: eliquis Family Communication:none Status is: Inpatient Remains inpatient appropriate because: Awaiting skilled nursing facility placement.    Code Status:     Code Status Orders  (From admission, onward)           Start     Ordered   01/26/22 1314  Full code  Continuous        01/26/22 1315           Code Status History     Date Active Date Inactive Code Status Order ID Comments User Context   01/11/2022 2240 01/17/2022 1905 Full Code [id number]  Kayleen Memos, DO ED   11/05/2021 1505 11/27/2021 1845 Full Code [id number]  Cathlyn Parsons, PA-C Inpatient   11/01/2021 1823 11/05/2021 1500 Full Code [id number]  Luanne Bras, MD Inpatient   10/25/2021 2157 11/01/2021 1823 Full Code [id number]  Kayleen Memos, DO Inpatient   06/24/2019 5027 06/24/2019 1556 Full Code [id number]  Dorothyann Gibbs, NP Inpatient   06/27/2016 1659 06/29/2016 1730 Full Code [id number]  Leandrew Koyanagi, MD Inpatient   02/23/2015 2009 02/27/2015 1833 Full Code [id number]  Leandrew Koyanagi, MD Inpatient   08/17/2013 1335 08/18/2013 0330 Full Code [id number]  Dereck Ligas, MD HOV         IV Access:   Peripheral IV   Procedures and diagnostic studies:   No results found.   Medical Consultants:   None.   Subjective:    Cassandra Dodson is sleepy this morning only answers yes or no questions.  Objective:    Vitals:   02/14/22 2238 02/15/22 0435 02/15/22 0448 02/15/22 0752  BP: 126/65 (!) 112/48  (!) 87/63  Pulse: 87 80  90  Resp: '18 18  19  '$ Temp: 98.8 F (37.1 C) 98.5 F (36.9 C)  98.9 F (37.2 C)  TempSrc: Oral Oral  Axillary  SpO2: 97% 99%  98%  Weight:   78.4 kg   Height:       SpO2: 98 % O2 Flow Rate (L/min): 2 L/min FiO2 (%): 30 %   Intake/Output Summary (Last 24 hours) at 02/15/2022 0758 Last data filed at 02/14/2022 2000 Gross per 24 hour  Intake 480 ml  Output 900 ml   Net -420 ml    Filed Weights   02/13/22 0425 02/14/22 0500 02/15/22 0448  Weight: 76 kg 76 kg 78.4 kg    Exam: General exam: In no acute distress. Respiratory system: Good air movement and clear to auscultation. Cardiovascular system: S1 & S2 heard, RRR. No JVD. Gastrointestinal system: Abdomen is nondistended, soft and nontender.  Extremities: Swelling of her left arm Skin: Multiple bruises Psychiatry: No judgment or insight of medical condition.   Data Reviewed:    Labs: Basic Metabolic Panel: Recent Labs  Lab 02/09/22 1554 02/10/22 0349 02/11/22 0331  NA 144 143 142  K 3.7 3.6 3.7  CL 112* 110 111  CO2 '27 27 26  '$ GLUCOSE 111* 123* 171*  BUN 19 17 31*  CREATININE 0.51 0.49 0.71  CALCIUM 9.1 9.2 9.2    GFR Estimated Creatinine Clearance: 53.8 mL/min (by C-G formula based on SCr of 0.71 mg/dL). Liver Function Tests: No results for input(s): "AST", "ALT", "ALKPHOS", "BILITOT", "PROT", "ALBUMIN" in the last 168 hours.  No results for input(s): "  LIPASE", "AMYLASE" in the last 168 hours. No results for input(s): "AMMONIA" in the last 168 hours. Coagulation profile No results for input(s): "INR", "PROTIME" in the last 168 hours. COVID-19 Labs  No results for input(s): "DDIMER", "FERRITIN", "LDH", "CRP" in the last 72 hours.  Lab Results  Component Value Date   SARSCOV2NAA NEGATIVE 01/26/2022   [place] NEGATIVE 11/16/2021   [place] NEGATIVE 11/11/2021   [place] NEGATIVE 11/08/2021    CBC: No results for input(s): "WBC", "NEUTROABS", "HGB", "HCT", "MCV", "PLT" in the last 168 hours.  Cardiac Enzymes: No results for input(s): "CKTOTAL", "CKMB", "CKMBINDEX", "TROPONINI" in the last 168 hours. BNP (last 3 results) No results for input(s): "PROBNP" in the last 8760 hours. CBG: Recent Labs  Lab 02/14/22 1219 02/14/22 1647 02/14/22 2234 02/15/22 0432 02/15/22 0755  GLUCAP 130* 183* 123* 171* 116*    D-Dimer: No results for input(s):  "DDIMER" in the last 72 hours. Hgb A1c: No results for input(s): "HGBA1C" in the last 72 hours. Lipid Profile: No results for input(s): "CHOL", "HDL", "LDLCALC", "TRIG", "CHOLHDL", "LDLDIRECT" in the last 72 hours. Thyroid function studies: No results for input(s): "TSH", "T4TOTAL", "T3FREE", "THYROIDAB" in the last 72 hours.  Invalid input(s): "FREET3" Anemia work up: No results for input(s): "VITAMINB12", "FOLATE", "FERRITIN", "TIBC", "IRON", "RETICCTPCT" in the last 72 hours. Sepsis Labs: No results for input(s): "PROCALCITON", "WBC", "LATICACIDVEN" in the last 168 hours.  Microbiology No results found for this or any previous visit (from the past 240 hour(s)).    Medications:    apixaban  5 mg Oral BID   Chlorhexidine Gluconate Cloth  6 each Topical Q0600   docusate  100 mg Oral BID   feeding supplement (PROSource TF20)  60 mL Per Tube Daily   insulin aspart  0-15 Units Subcutaneous Q4H   insulin aspart  0-5 Units Subcutaneous QHS   insulin aspart  3 Units Subcutaneous TID WC   insulin glargine-yfgn  10 Units Subcutaneous QHS   metoprolol tartrate  25 mg Oral BID   mouth rinse  15 mL Mouth Rinse 4 times per day   pantoprazole  40 mg Oral Daily   polyethylene glycol  17 g Oral BID   rosuvastatin  20 mg Oral Daily   senna  2 tablet Oral Daily   sodium chloride flush  10-40 mL Intracatheter Q12H   Continuous Infusions:  sodium chloride 10 mL/hr at 02/01/22 1626      LOS: 20 days   Charlynne Cousins  [facility]  02/15/2022, 7:58 AM

## 2022-02-15 NOTE — Progress Notes (Signed)
Speech Language Pathology Treatment: Dysphagia  Patient Details Name: Cassandra Dodson MRN: [id number] DOB: 1941/01/19 Today's Date: 02/15/2022 Time: 0912-0927 SLP Time Calculation (min) (ACUTE ONLY): 15 min  Assessment / Plan / Recommendation Clinical Impression  Pt required total assist with feeding this morning, puree and honey thick liquids. Her signs of aspiration persist with wet vocal quality throughout session. She is able to throat clear with reminders that only briefly clears before returning to a wet vocal quality. It is unlikely that she will be able to be safely advanced from this texture/liquids and a repeat MBS is not warranted. Recommend continue honey thick liquids, puree texture, volitional throat clears, crush pills and full supervision/assist. Will decrease frequency to one time a week.    HPI HPI: Cassandra Dodson is a 81 y.o. female with medical history significant for early stage uterine cancer, hypertension, type 2 diabetes, hypothyroidism, GERD, CKD 3B, chronic anxiety/depression, osteoarthritis, CVA and meningioma with left-sided deficit and speech difficulty, who presented to [facility] ED due to confusion x 2 days. In the ED, work-up revealed CT head with stable meningioma measuring 5 cm and associated vasogenic edema and regional mass effect.  Underwent IR thrombectomy 10/9. FEES 10/2021 penetration thin and nectar, wet vocal quality throughout, D3/nectar recommended.      SLP Plan  Continue with current plan of care (downgrade once a week)      Recommendations for follow up therapy are one component of a multi-disciplinary discharge planning process, led by the attending physician.  Recommendations may be updated based on patient status, additional functional criteria and insurance authorization.    Recommendations  Diet recommendations: Dysphagia 1 (puree);Honey-thick liquid Liquids provided via: Cup Medication Administration: Crushed with puree Supervision: Full  supervision/cueing for compensatory strategies Compensations: Slow rate;Small sips/bites;Minimize environmental distractions;Clear throat intermittently Postural Changes and/or Swallow Maneuvers: Seated upright 90 degrees                Oral Care Recommendations: Oral care BID Follow Up Recommendations: Skilled nursing-short term rehab (<3 hours/day) Assistance recommended at discharge: Frequent or constant Supervision/Assistance SLP Visit Diagnosis: Dysphagia, oropharyngeal phase (R13.12) Plan: Continue with current plan of care (downgrade once a week)           [name]  02/15/2022, 9:34 AM Orbie Pyo Colvin Caroli.Ed Product/process development scientist [phone]

## 2022-02-15 NOTE — TOC Progression Note (Addendum)
Transition of Care [facility]) - Progression Note    Patient Details  Name: Cassandra Dodson MRN: [id number] Date of Birth: Nov 05, 1940  Transition of [facility]) CM/SW Contact  [name], LCSW Phone Number: 02/15/2022, 8:46 AM  Clinical Narrative:   CSW spoke with [employer] about appeal being approved--appeal is still showing pending in their system.    CSW received email from [place] counseling.  She screened pt for medicaid, spoke with son, pt is well above income limit.  Son not interested in spend down.  CSW called [employer] directly, eventually transferred to someone who confirmed that the appeal had been approved but unable to give any specifics.  States pt "approved for SNF until discharge."  CSW spoke to [facility] again, they said can take 72 hours from time of decision before they are notified and can approve days.  SNF can admit based on the verbal, if they are willing to.  CSW spoke with [facility]iel[facility] and she cannot admit until days are approved in [name].    Expected Discharge Plan: [place] Barriers to Discharge: Continued Medical Work up, SNF Pending bed offer  Expected Discharge Plan and Services Expected Discharge Plan: [place] In-house Referral: Clinical Social Work   Post Acute Care Choice: [place] Living arrangements for the past 2 months: Single Family Home Expected Discharge Date: 02/13/22                                     Social Determinants of Health (SDOH) Interventions    Readmission Risk Interventions     No data to display

## 2022-02-15 NOTE — TOC Transition Note (Signed)
Discharge medications (2) are being stored in the main pharmacy on the ground floor until patient is ready for discharge.   

## 2022-02-15 NOTE — Progress Notes (Signed)
Night meds refused

## 2022-02-15 NOTE — Care Management Important Message (Signed)
Important Message  Patient Details  Name: Cassandra Dodson MRN: [id number] Date of Birth: Dec 20, 1940   Medicare Important Message Given:  Yes     Hannah Beat 02/15/2022, 2:17 PM

## 2022-02-16 DIAGNOSIS — I6322 Cerebral infarction due to unspecified occlusion or stenosis of basilar arteries: Secondary | ICD-10-CM | POA: Diagnosis not present

## 2022-02-16 DIAGNOSIS — G9389 Other specified disorders of brain: Secondary | ICD-10-CM | POA: Diagnosis not present

## 2022-02-16 DIAGNOSIS — I2602 Saddle embolus of pulmonary artery with acute cor pulmonale: Secondary | ICD-10-CM | POA: Diagnosis not present

## 2022-02-16 LAB — GLUCOSE, CAPILLARY
Glucose-Capillary: 105 mg/dL — ABNORMAL HIGH (ref 70–99)
Glucose-Capillary: 231 mg/dL — ABNORMAL HIGH (ref 70–99)
Glucose-Capillary: 90 mg/dL (ref 70–99)

## 2022-02-16 MED ORDER — METOPROLOL TARTRATE 25 MG PO TABS: 25.0000 mg | ORAL_TABLET | Freq: Two times a day (BID) | ORAL | 3 refills | Status: DC

## 2022-02-16 MED ORDER — APIXABAN 5 MG PO TABS: 5.0000 mg | ORAL_TABLET | Freq: Two times a day (BID) | ORAL | 3 refills | Status: DC

## 2022-02-16 MED ORDER — SENNA 8.6 MG PO TABS: 13.2000 mg | ORAL_TABLET | Freq: Every evening | ORAL | 0 refills | Status: DC | PRN

## 2022-02-16 MED ORDER — INSULIN LISPRO (1 UNIT DIAL) 100 UNIT/ML (KWIKPEN): 3.0000 [IU] | PEN_INJECTOR | Freq: Three times a day (TID) | SUBCUTANEOUS | 11 refills | Status: DC

## 2022-02-16 MED ORDER — DOCUSATE SODIUM 100 MG PO CAPS: 100.0000 mg | ORAL_CAPSULE | Freq: Two times a day (BID) | ORAL | 0 refills | Status: DC

## 2022-02-16 MED ORDER — CALCIUM CARBONATE 1250 (500 CA) MG PO TABS: 1250.0000 mg | ORAL_TABLET | Freq: Every day | ORAL | 0 refills | Status: DC

## 2022-02-16 MED ORDER — INSULIN GLARGINE 100 UNIT/ML SOLOSTAR PEN: 15.0000 [IU] | PEN_INJECTOR | Freq: Every day | SUBCUTANEOUS | 0 refills | Status: DC

## 2022-02-16 NOTE — Plan of Care (Signed)

## 2022-02-16 NOTE — Discharge Summary (Signed)
Physician Discharge Summary   Patient: Cassandra Dodson MRN: [id number] DOB: 1940/10/06  Admit date:     01/26/2022  Discharge date: 02/16/22  Discharge Physician: Lorella Nimrod   PCP: Shon Baton, MD   Recommendations at discharge:  Please obtain CBC and BMP in 1 week Follow-up with primary care provider Follow-up with vascular surgery Follow-up with neurosurgery  Discharge Diagnoses: Principal Problem:   Pulmonary embolism with acute cor pulmonale ([place]) Active Problems:   Brain mass   CVA (cerebral vascular accident) ([place])   Pressure injury of skin   Malnutrition of moderate degree   Hospital Course: Cassandra Dodson is an 81 y.o. 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  She was switched to IV heparin and later switched to Eliquis.    Started developing thrombocytopenia, hematology consulted antibodies for HIT were negative.  Her home aspirin and Plavix was discontinued to decrease the risk of bleeding.  Patient also had acute blood loss anemia during current hospitalization requiring 2 unit of PRBC.  Hemoglobin currently stable.  Electrolyte was repleted as needed during hospitalization.  Her diabetes was managed with long-acting insulin plus sliding scale.  Patient has an history of meningioma and will continue follow-up with neurosurgery as an outpatient.  Patient also has history of dysphagia, and will continue on dysphagia diet.  No need for PEG.  However PT and OT evaluated her and recommended SNF.  She was initially declined by insurance and later  her appeal was approved.  She is being discharged to SNF for further management.  Patient will continue on current medications and need to have a close follow-up with her providers for for further recommendations.        Consultants: PCCM, vascular surgery, hematology Procedures performed: Mechanical thrombectomy and thrombolysis Disposition: Skilled nursing facility Diet recommendation:  Discharge Diet Orders (From admission, onward)     Start     Ordered   02/13/22 0000  Diet - low sodium heart healthy        02/13/22 0853           Dysphagia type 1 thin Liquid DISCHARGE MEDICATION: Allergies as of 02/16/2022       Reactions   Anesthetics, Amide Other (See Comments)   Pt is intolerant to general anesthesia. Pt will throw up and has thrown up during procedure.    Ether Nausea And Vomiting        Medication List     STOP taking these medications    aspirin EC 81 MG tablet   clopidogrel 75 MG tablet Commonly known as: PLAVIX   traMADol 50 MG tablet Commonly known as: ULTRAM       TAKE these medications    amLODipine 5 MG tablet Commonly known as: NORVASC Take 1 tablet (5 mg total) by mouth daily.   apixaban 5 MG Tabs tablet Commonly known as: ELIQUIS Take 1 tablet (5 mg total) by mouth 2 (two) times daily.   calcium carbonate 1250 (500 Ca) MG tablet Commonly known as: OS-CAL - dosed in mg of elemental calcium Take 1 tablet (1,250 mg total) by mouth daily with breakfast.  Desitin 13 % Crea Generic drug: Zinc Oxide Apply 1 application  topically 2 (two) times daily. To buttocks for protection.   docusate sodium 100 MG capsule Commonly known as: COLACE Take 1 capsule (100 mg total) by mouth 2 (two) times daily.   fenofibrate 160 MG tablet Take 1 tablet (160 mg total) by mouth daily after breakfast.   folic acid 1 MG tablet Commonly known as: FOLVITE Take 1 tablet (1 mg total) by mouth daily.   insulin glargine 100 UNIT/ML Solostar  Pen Commonly known as: LANTUS Inject 15 Units into the skin daily.   insulin lispro 100 UNIT/ML KwikPen Commonly known as: HUMALOG Inject 3 Units into the skin 3 (three) times daily with meals. What changed: how much to take   methylphenidate 5 MG tablet Commonly known as: RITALIN Take 1 tablet (5 mg total) by mouth 2 (two) times daily with breakfast and lunch.   metoprolol tartrate 25 MG tablet Commonly known as: LOPRESSOR Take 1 tablet (25 mg total) by mouth 2 (two) times daily.   mouth rinse Liqd solution 15 mLs by Mouth Rinse route as needed (for oral care).   pantoprazole 40 MG tablet Commonly known as: PROTONIX Take 1 tablet (40 mg total) by mouth daily.   Pentips 32G X 4 MM Misc Generic drug: Insulin Pen Needle Use daily in the morning, at noon, and at bedtime.   rosuvastatin 20 MG tablet Commonly known as: CRESTOR Take 1 tablet (20 mg total) by mouth daily.   senna 8.6 MG Tabs tablet Commonly known as: SENOKOT Take 1.5 tablets (12.9 mg total) by mouth at bedtime as needed. What changed:  how much to take reasons to take this               Discharge Care Instructions  (From admission, onward)           Start     Ordered   02/16/22 0000  Discharge wound care:       Comments: Frequently change position and apply gel dressing as needed   02/16/22 1042   02/13/22 0000  Discharge wound care:       Comments: Per wound care nurse instructions   02/13/22 0853            Contact information for after-discharge care     Destination     [facility] Preferred SNF .   Service: Skilled Nursing Contact information: [address] [place] [phone]                    Discharge Exam: Danley Danker Weights   02/14/22 0500 02/15/22 0448 02/16/22 0500  Weight: 76 kg 78.4 kg 78.8 kg   General.  Frail elderly lady, in no acute distress. Pulmonary.  Lungs clear bilaterally, normal respiratory  effort. CV.  Regular rate and rhythm, no JVD, rub or murmur. Abdomen.  Soft, nontender, nondistended, BS positive. CNS.  Alert and oriented to self only.  No focal neurologic deficit. Extremities.  No edema, no cyanosis, pulses intact and symmetrical. Psychiatry.  Judgment and insight appears  impaired.  Condition at discharge: stable  The results of significant diagnostics from this hospitalization (including imaging, microbiology, ancillary and laboratory) are listed below for reference.   Imaging Studies: DG Swallowing Func-Speech Pathology  Result Date: 02/04/2022 Table formatting from the original result was not included. Objective Swallowing Evaluation: Type of Study: MBS-Modified Barium Swallow Study  Patient Details Name: KAIDANCE PANTOJA MRN:  [id number] Date of Birth: 1940-06-27 Today's Date: 02/04/2022 Time: SLP Start Time (ACUTE ONLY): 55 -SLP Stop Time (ACUTE ONLY): 1028 SLP Time Calculation (min) (ACUTE ONLY): 18 min Past Medical History: Past Medical History: Diagnosis Date  Arthritis   CKD (chronic kidney disease), stage III ([place])   patient denies  Depression   Diabetes mellitus without complication ([place])   Difficult intravenous access   Endometrial cancer ([place])   GERD (gastroesophageal reflux disease)   Headache   History of radiation therapy 11/04/2019-12/01/2019  Endometrial HDR; Dr. Gery Pray  Hypertension   Hypothyroidism   Neuromuscular disorder ([place])   neuropathy in feet  Osteoarthritis   PMB (postmenopausal bleeding)   PONV (postoperative nausea and vomiting)   severe nausea and vomiting after knee replacement 06-2014, did ok with 2018 knee replacement  Urinary frequency   Wears glasses  Past Surgical History: Past Surgical History: Procedure Laterality Date  BREAST SURGERY    cyst removed  CESAREAN SECTION    DILATION AND CURETTAGE OF UTERUS N/A 06/24/2019  Procedure: DILATATION AND CURETTAGE;  Surgeon: Lafonda Mosses, MD;  Location: [facility];  Service:  Gynecology;  Laterality: N/A;  FRACTURE SURGERY    right knee  INTRAUTERINE DEVICE (IUD) INSERTION N/A 06/24/2019  Procedure: INTRAUTERINE DEVICE (IUD) INSERTION MIRENA;  Surgeon: Lafonda Mosses, MD;  Location: [facility];  Service: Gynecology;  Laterality: N/A;  IR ANGIO INTRA EXTRACRAN SEL COM CAROTID INNOMINATE UNI R MOD SED  11/05/2021  IR ANGIO VERTEBRAL SEL VERTEBRAL UNI R MOD SED  11/05/2021  IR ANGIOGRAM PULMONARY BILATERAL SELECTIVE  01/28/2022  IR ANGIOGRAM SELECTIVE EACH ADDITIONAL VESSEL  01/28/2022  IR ANGIOGRAM SELECTIVE EACH ADDITIONAL VESSEL  01/28/2022  IR CT HEAD LTD  11/01/2021  IR INTRA CRAN STENT  11/01/2021  IR RADIOLOGIST EVAL & MGMT  12/19/2021  IR THROMBECT PRIM MECH INIT (INCLU) MOD SED  01/28/2022  IR THROMBECT PRIM MECH INIT (INCLU) MOD SED  01/28/2022  IR US GUIDE VASC ACCESS RIGHT  11/01/2021  IR US GUIDE VASC ACCESS RIGHT  01/28/2022  IRRIGATION AND DEBRIDEMENT SEBACEOUS CYST    JOINT REPLACEMENT Right   right  LYMPH NODE DISSECTION N/A 09/14/2019  Procedure: LYMPH NODE DISSECTION;  Surgeon: Lafonda Mosses, MD;  Location: [facility];  Service: Gynecology;  Laterality: N/A;  RADIOLOGY WITH ANESTHESIA N/A 11/01/2021  Procedure: ANGIOPLASTY/STENT;  Surgeon: Luanne Bras, MD;  Location: [place];  Service: Radiology;  Laterality: N/A;  RADIOLOGY WITH ANESTHESIA N/A 01/28/2022  Procedure: IR WITH ANESTHESIA;  Surgeon: Radiologist, Medication, MD;  Location: [facility];  Service: Radiology;  Laterality: N/A;  ROBOTIC ASSISTED TOTAL HYSTERECTOMY WITH BILATERAL SALPINGO OOPHERECTOMY Bilateral 09/14/2019  Procedure: XI ROBOTIC ASSISTED TOTAL HYSTERECTOMY WITH BILATERAL SALPINGO OOPHORECTOMY;  Surgeon: Lafonda Mosses, MD;  Location: [facility];  Service: Gynecology;  Laterality: Bilateral;  SENTINEL NODE BIOPSY N/A 09/14/2019  Procedure: SENTINEL NODE BIOPSY;  Surgeon: Lafonda Mosses, MD;  Location: [facility];  Service: Gynecology;  Laterality: N/A;  teeth extration    TONSILLECTOMY    TOTAL  HIP ARTHROPLASTY Right 02/23/2015  Procedure: RIGHT TOTAL HIP ARTHROPLASTY ANTERIOR APPROACH;  Surgeon: Leandrew Koyanagi, MD;  Location: [facility];  Service: Orthopedics;  Laterality: Right;  TOTAL KNEE ARTHROPLASTY Left 06/27/2016  Procedure: LEFT TOTAL KNEE ARTHROPLASTY;  Surgeon: Leandrew Koyanagi, MD;  Location: [place] [facility];  Service: Orthopedics;  Laterality: Left; HPI: MISHAEL KRYSIAK is a 81 y.o. female with medical history significant for early stage uterine cancer, hypertension, type  2 diabetes, hypothyroidism, GERD, CKD 3B, chronic anxiety/depression, osteoarthritis, CVA and meningioma with left-sided deficit and speech difficulty, who presented to [facility] ED due to confusion x 2 days. In the ED, work-up revealed CT head with stable meningioma measuring 5 cm and associated vasogenic edema and regional mass effect.  Underwent IR thrombectomy 10/9. FEES 10/2021 penetration thin and nectar, wet vocal quality throughout, D3/nectar recommended.  Subjective: cooperative but confused  Recommendations for follow up therapy are one component of a multi-disciplinary discharge planning process, led by the attending physician.  Recommendations may be updated based on patient status, additional functional criteria and insurance authorization. Assessment / Plan / Recommendation   02/04/2022  10:43 AM Clinical Impressions Clinical Impression Pt presents with severe oropharyngeal dysphagia largely decreased timing of laryngeal closure/swallow initiation delay to the level of the valleculae, which did not improve with any consistency. During thin and nectar thick liquid trials bolus material spilled from the valleculae into the laryngeal vestibule prior to the initiation of the swallow. Silent aspiration (PAS 8) noted with thin liquids, and penetration (PAS 3/5) noted with nectar thick liquids prior to the swallow. Residue from thin/nectar thick liquids remained in laryngeal vestibule throughout study. Residue in the valleculae and pyriform sinuses  present with trials of nectar and honey thick liquids, which increased in amount and was noted in the lateral channels with puree administration. Prolonged oral transit also present with puree adminsitration. Instances of penetration displayed with honey thick liquids via cup (PAS 2). Throughout study pt did not sense bolus material in airway and was cognitively unable to follow commands to cough/throat clear. Anatomically, pt presents with a cricopharyngeal bar. Recommend pt start honey thick liquids via teaspoon only/puree consistency diet, medications crushed in puree. MD consulted and agreed with diet recommendations given aspiration risk and may consult Palliative care. SLP will follow for diet toleration and education. SLP Visit Diagnosis Dysphagia, oropharyngeal phase (R13.12) Impact on safety and function Severe aspiration risk     02/04/2022  10:43 AM Treatment Recommendations Treatment Recommendations Therapy as outlined in treatment plan below     02/04/2022  10:43 AM Prognosis Prognosis for Safe Diet Advancement Fair Barriers to Reach Goals Severity of deficits;Cognitive deficits   02/04/2022  10:43 AM Diet Recommendations SLP Diet Recommendations Honey thick liquids;Dysphagia 1 (Puree) solids;Other (Comment) Liquid Administration via Spoon Medication Administration Whole meds with puree Compensations Slow rate;Small sips/bites;Minimize environmental distractions Postural Changes Seated upright at 90 degrees     02/04/2022  10:43 AM Other Recommendations Oral Care Recommendations Oral care BID Follow Up Recommendations Long-term institutional care without follow-up therapy Assistance recommended at discharge Frequent or constant Supervision/Assistance Functional Status Assessment Patient has had a recent decline in their functional status and demonstrates the ability to make significant improvements in function in a reasonable and predictable amount of time.   02/04/2022  10:43 AM Frequency and Duration   Speech Therapy Frequency (ACUTE ONLY) min 2x/week Treatment Duration 2 weeks     02/04/2022  10:43 AM Oral Phase Oral Phase Impaired Oral - Honey Teaspoon WFL Oral - Honey Cup WFL Oral - Nectar Teaspoon NT Oral - Nectar Cup WFL Oral - Nectar Straw NT Oral - Thin Teaspoon NT Oral - Thin Cup WFL Oral - Thin Straw NT Oral - Puree Delayed oral transit;Decreased bolus cohesion Oral - Mech Soft NT Oral - Regular NT Oral - Multi-Consistency NT Oral - Pill NT    02/04/2022  10:43 AM Pharyngeal Phase Pharyngeal Phase Impaired Pharyngeal- Honey Teaspoon Delayed swallow  initiation-vallecula;Reduced pharyngeal peristalsis;Penetration/Aspiration during swallow;Pharyngeal residue - pyriform;Pharyngeal residue - valleculae Pharyngeal Material enters airway, remains ABOVE vocal cords and not ejected out Pharyngeal- Honey Cup Delayed swallow initiation-vallecula;Delayed swallow initiation-pyriform sinuses;Penetration/Aspiration during swallow;Pharyngeal residue - pyriform;Pharyngeal residue - valleculae Pharyngeal Material enters airway, remains ABOVE vocal cords and not ejected out Pharyngeal- Nectar Teaspoon NT Pharyngeal- Nectar Cup Delayed swallow initiation-vallecula;Penetration/Aspiration before swallow;Pharyngeal residue - pyriform;Pharyngeal residue - valleculae Pharyngeal Material enters airway, CONTACTS cords and not ejected out;Material enters airway, remains ABOVE vocal cords and not ejected out Pharyngeal- Nectar Straw NT Pharyngeal- Thin Teaspoon NT Pharyngeal- Thin Cup Trace aspiration;Penetration/Aspiration before swallow;Delayed swallow initiation-vallecula Pharyngeal Material enters airway, passes BELOW cords without attempt by patient to eject out (silent aspiration) Pharyngeal- Thin Straw NT Pharyngeal- Puree Delayed swallow initiation-vallecula;Pharyngeal residue - valleculae;Pharyngeal residue - pyriform;Lateral channel residue Pharyngeal Material does not enter airway Pharyngeal- Mechanical Soft NT Pharyngeal-  Regular NT Pharyngeal- Multi-consistency NT Pharyngeal- Pill NT    02/04/2022  10:43 AM Cervical Esophageal Phase  Cervical Esophageal Phase -- [name] 02/04/2022, 11:43 AM                     DG Abd 1 View  Result Date: 02/03/2022 CLINICAL DATA:  NG tube placement EXAM: ABDOMEN - 1 VIEW COMPARISON:  02/01/2022 FINDINGS: There is interval removal of feeding tube. There is interval placement of NG tube which follows the course of trachea and right main bronchus with its tip in the medial right lower lung fields. NG tube should be removed and repositioned. Cardiac size is within normal limits. Thoracic aorta is tortuous and ectatic. Lung fields are clear of any focal consolidation or pulmonary edema. Small linear density in the medial left lower lung fields may suggest subsegmental atelectasis. There is no pleural effusion or pneumothorax. Tip of right PICC line is seen in the region of junction of inferior vena cava and right atrium. IMPRESSION: Tip of NG tube is noted in the medial right lower lung field suggesting ectopic location in the bronchus. Lung fields are clear of any focal consolidation or pulmonary edema. Small linear density in medial left lower lung fields suggest subsegmental atelectasis. Tip of right PICC line is noted in the region of junction of inferior vena cava and right atrium. Imaging finding of NG tube to be in bronchus in the medial right lower lung field was relayed to Dr. Doristine Bosworth by telephone call at 11:56 a.m. on 02/03/2022. Electronically Signed   By: Elmer Picker M.D.   On: 02/03/2022 12:01   DG Abd Portable 1V  Result Date: 02/01/2022 CLINICAL DATA:  Feeding tube placement EXAM: PORTABLE ABDOMEN - 1 VIEW COMPARISON:  CT abdomen and pelvis 01/30/2019 FINDINGS: The bowel gas pattern is normal. Weighted enteric tube terminates in the Peri pyloric region. Gas distended stomach. Degenerative changes in the visualized lumbar spine. IMPRESSION: Weighted enteric  tube terminates in the peripyloric region. Electronically Signed   By: Marin Roberts M.D.   On: 02/01/2022 15:54   ECHOCARDIOGRAM LIMITED  Result Date: 01/30/2022    ECHOCARDIOGRAM LIMITED REPORT   Patient Name:   CARRYE GOLLER Date of Exam: 01/30/2022 Medical Rec #:  [id number]    Height:       63.0 in Accession #:    [id number]   Weight:       185.2 lb Date of Birth:  12-Feb-1941    BSA:          1.871 m Patient Age:    21 years  BP:           144/65 mmHg Patient Gender: F            HR:           86 bpm. Exam Location:  Inpatient Procedure: Limited Echo, Color Doppler and Cardiac Doppler Indications:    pulmonary embolus  History:        Patient has prior history of Echocardiogram examinations, most                 recent 01/26/2022. Risk Factors:Diabetes.  Sonographer:    Johny Chess RDCS Referring Phys: [id number] Candee Furbish  Sonographer Comments: Echo performed with patient supine and on artificial respirator and Technically difficult study due to poor echo windows. Image acquisition challenging due to respiratory motion. IMPRESSIONS  1. Left ventricular ejection fraction, by estimation, is 60 to 65%. The left ventricle has normal function. The left ventricle has no regional wall motion abnormalities. There is mild left ventricular hypertrophy. Left ventricular diastolic function could not be evaluated.  2. Right ventricular systolic function is low normal. The right ventricular size is mildly enlarged.  3. The mitral valve was not well visualized. No evidence of mitral valve regurgitation. No evidence of mitral stenosis.  4. The aortic valve is normal in structure. There is moderate calcification of the aortic valve. There is mild thickening of the aortic valve. Aortic valve regurgitation is not visualized. No aortic stenosis is present.  5. The inferior vena cava is normal in size with greater than 50% respiratory variability, suggesting right atrial pressure of 3 mmHg. FINDINGS  Left Ventricle:  Left ventricular ejection fraction, by estimation, is 60 to 65%. The left ventricle has normal function. The left ventricle has no regional wall motion abnormalities. The left ventricular internal cavity size was small. There is mild left ventricular hypertrophy. Left ventricular diastolic function could not be evaluated. Right Ventricle: The right ventricular size is mildly enlarged. No increase in right ventricular wall thickness. Right ventricular systolic function is low normal. Left Atrium: Left atrial size was normal in size. Right Atrium: Right atrial size was normal in size. Pericardium: There is no evidence of pericardial effusion. Mitral Valve: The mitral valve was not well visualized. No evidence of mitral valve stenosis. Tricuspid Valve: The tricuspid valve is not well visualized. Tricuspid valve regurgitation is not demonstrated. No evidence of tricuspid stenosis. Aortic Valve: The aortic valve is normal in structure. There is moderate calcification of the aortic valve. There is mild thickening of the aortic valve. Aortic valve regurgitation is not visualized. No aortic stenosis is present. Pulmonic Valve: The pulmonic valve was not well visualized. Pulmonic valve regurgitation is not visualized. No evidence of pulmonic stenosis. Aorta: The aortic root is normal in size and structure. Venous: The inferior vena cava is normal in size with greater than 50% respiratory variability, suggesting right atrial pressure of 3 mmHg. IAS/Shunts: No atrial level shunt detected by color flow Doppler. LEFT VENTRICLE PLAX 2D LVIDd:         3.10 cm LVIDs:         2.30 cm LV PW:         0.80 cm LV IVS:        0.80 cm LVOT diam:     2.00 cm LV SV:         64 LV SV Index:   34 LVOT Area:     3.14 cm  LEFT ATRIUM  Index LA diam:    3.00 cm 1.60 cm/m  AORTIC VALVE LVOT Vmax:   111.50 cm/s LVOT Vmean:  70.950 cm/s LVOT VTI:    0.202 m MITRAL VALVE MV Area (PHT): 3.85 cm    SHUNTS MV Decel Time: 197 msec    Systemic  VTI:  0.20 m MV E velocity: 78.00 cm/s  Systemic Diam: 2.00 cm MV A velocity: 66.50 cm/s MV E/A ratio:  1.17 Aditya Sabharwal Electronically signed by Hebert Soho Signature Date/Time: 01/30/2022/5:58:27 PM    Final    DG CHEST PORT 1 VIEW  Result Date: 01/30/2022 CLINICAL DATA:  Fever EXAM: PORTABLE CHEST 1 VIEW COMPARISON:  Chest x-ray dated January 26, 2022 FINDINGS: Cardiac and mediastinal contours are within normal limits. ETT tip is approximately 3 cm from the carina. Enteric tube partially seen coursing below the diaphragm. Lungs are clear. No large pleural effusion or pneumothorax. IMPRESSION: 1. ETT tip is approximately 3 cm from the carina. 2. Lungs are clear. Electronically Signed   By: Yetta Glassman M.D.   On: 01/30/2022 10:11   [place] EKG SITE RITE  Result Date: 01/30/2022 If Site Rite image not attached, placement could not be confirmed due to current cardiac rhythm.  CT ABDOMEN PELVIS WO CONTRAST  Result Date: 01/29/2022 CLINICAL DATA:  Retroperitoneal bleeding, post angiography EXAM: CT ABDOMEN AND PELVIS WITHOUT CONTRAST TECHNIQUE: Multidetector CT imaging of the abdomen and pelvis was performed following the standard protocol without IV contrast. RADIATION DOSE REDUCTION: This exam was performed according to the departmental dose-optimization program which includes automated exposure control, adjustment of the mA and/or kV according to patient size and/or use of iterative reconstruction technique. COMPARISON:  01/26/2022 FINDINGS: Lower chest: Bibasilar atelectasis Hepatobiliary: Vicarious excretion of contrast material within gallbladder. Motion artifacts at liver, no gross abnormality seen Pancreas: Motion artifacts, no gross abnormality Spleen: Normal appearance Adrenals/Urinary Tract: Thickening of adrenal glands without discrete mass. Motion artifacts at kidneys. No mass or hydronephrosis. Excreted contrast material and Foley catheter within decompressed bladder.  Stomach/Bowel: Normal appendix. Nasogastric tube in stomach. Distal colonic diverticulosis without evidence of diverticulitis. No gross bowel abnormalities. Vascular/Lymphatic: Extensive atherosclerotic calcifications aorta, iliac arteries, femoral arteries, visceral arteries. Aorta normal caliber. No adenopathy. Reproductive: Uterus surgically absent.  Nonvisualization of ovaries Other: No free air or free fluid. Minimal stranding of presacral fat, nonspecific, mildly increased from previous exam. Infiltrative changes at RIGHT inguinal region question soft tissue edema or scattered hemorrhage from prior arteriography, no discrete hematoma identified. No intrapelvic or retroperitoneal hematoma seen. No hernia. Musculoskeletal: Osseous demineralization. Beam hardening artifacts in pelvis from RIGHT hip prosthesis. No acute osseous findings. IMPRESSION: Distal colonic diverticulosis without evidence of diverticulitis. Infiltrative changes at RIGHT inguinal region question soft tissue edema or scattered hemorrhage from prior arteriography, no discrete inguinal hematoma identified. No intrapelvic or retroperitoneal hematoma seen. Bibasilar atelectasis. Extensive atherosclerotic disease changes. Aortic Atherosclerosis (ICD10-I70.0). Electronically Signed   By: Lavonia Dana M.D.   On: 01/29/2022 11:55   IR THROMBECT PRIM MECH INIT (INCLU) MOD SED  Result Date: 01/28/2022 INDICATION: Submassive bilateral central pulmonary emboli with hypoxia RV strain, shortness of breath, tachycardia. Meningioma, subacute and chronic CVAs. Bilateral central pulmonary emboli. EXAM: BILATERAL PULMONARY ARTERIOGRAM RIGHT PULMONARY ARTERIAL THROMBECTOMY LEFT PULMONARY ARTERIAL THROMBECTOMY ULTRASOUND GUIDANCE FOR VASCULAR ACCESS COMPARISON:  CTA 01/26/2022 MEDICATIONS: Lidocaine 1% subcutaneous, heparin 5000 units ANESTHESIA/SEDATION: Intubated, with monitored anesthesia care TECHNIQUE: Informed written consent was obtained from the son  after a thorough discussion of the procedural risks, benefits and alternatives,  as the patient was intubated. All questions were addressed. Maximal Sterile Barrier Technique was utilized including caps, mask, sterile gowns, sterile gloves, sterile drape, hand hygiene and skin antiseptic. A timeout was performed prior to the initiation of the procedure. Patency and complete compressibility of right common femoral vein was confirmed with ultrasound. After surgical prep, and local lidocaine administration, micropuncture access to right common femoral vein achieved, exchanged over a Bentson wire for a 7 [place] vascular sheath. 6 French angled pigtail catheter advanced into the right pulmonary artery. Pressure measurements obtained. Pulmonary arteriogram performed. Patient was heparinized with 5000 units heparin, and ACT was maintained greater than 200 throughout the case. Catheter exchanged for a 5 French vert catheter, negotiated into lower lobe branch right pulmonary artery, removed over a stiff Amplatz wire. Vascular sheath was upsized, and 24 French sheath placed, and coaxial 24 [place] FlowTriever device advanced into distal interlobar branch right pulmonary artery. Suction thrombectomy performed. Follow-up selective right pulmonary arteriography was obtained. The catheter was then redirected to the origin of the left pulmonary artery for left pulmonary arteriography. The 5 [place] vert catheter was coaxially advanced into a distal lower lobe branch, exchanged for FlowTriever device advanced into distal left pulmonary artery. Suction mechanical thrombectomy performed. Follow-up selective left pulmonary arteriogram obtained. Catheter was retracted into the distal main pulmonary artery for final pulmonary arteriography and pressure measurements. Guidewire, catheter and sheath were removed and hemostasis achieved with aid of 0 silk pursestring suture. The patient tolerated the procedure well. Operators: Vernard Gambles, Mir  FLUOROSCOPY TIME:  Radiation Exposure Index (as provided by the fluoroscopic device): 66.7 mGy air Kerma COMPLICATIONS: None immediate. FINDINGS: Initial right pulmonary arterial pressure  53/12 (27) mmHg. Selective right pulmonary arteriogram shows partially occlusive central embolus across its bifurcation with partially occlusive clot extending into the intralobar branch and lower lobe segmental branches. After right pulmonary arterial selective suction thrombectomy, there is clearance of the central clot with some residual partially occlusive mural thrombus in the interlobar branch, no significant residual large or occlusive clots. Selective left pulmonary arteriogram demonstrates partially occlusive thrombus in the proximal left pulmonary artery extending through its length. After left pulmonary artery selective suction thrombectomy, clearance of thrombus from the left pulmonary artery with improved branch perfusion, good homogeneous parenchymal enhancement of the left lung. Final pulmonary arterial pressure 38/10 (20) mmHg. IMPRESSION: 1. Successful bilateral pulmonary artery mechanical thrombectomy with improvement in pulmonary arterial pressure gradient. 2. Continue heparin . 3. Consider transition to oral anticoagulation. Electronically Signed   By: Lucrezia Europe M.D.   On: 01/28/2022 16:07   IR US Guide Vasc Access Right  Result Date: 01/28/2022 INDICATION: Submassive bilateral central pulmonary emboli with hypoxia RV strain, shortness of breath, tachycardia. Meningioma, subacute and chronic CVAs. Bilateral central pulmonary emboli. EXAM: BILATERAL PULMONARY ARTERIOGRAM RIGHT PULMONARY ARTERIAL THROMBECTOMY LEFT PULMONARY ARTERIAL THROMBECTOMY ULTRASOUND GUIDANCE FOR VASCULAR ACCESS COMPARISON:  CTA 01/26/2022 MEDICATIONS: Lidocaine 1% subcutaneous, heparin 5000 units ANESTHESIA/SEDATION: Intubated, with monitored anesthesia care TECHNIQUE: Informed written consent was obtained from the son after a  thorough discussion of the procedural risks, benefits and alternatives, as the patient was intubated. All questions were addressed. Maximal Sterile Barrier Technique was utilized including caps, mask, sterile gowns, sterile gloves, sterile drape, hand hygiene and skin antiseptic. A timeout was performed prior to the initiation of the procedure. Patency and complete compressibility of right common femoral vein was confirmed with ultrasound. After surgical prep, and local lidocaine administration, micropuncture access to right common femoral vein achieved, exchanged over a  Bentson wire for a 7 [place] vascular sheath. 6 French angled pigtail catheter advanced into the right pulmonary artery. Pressure measurements obtained. Pulmonary arteriogram performed. Patient was heparinized with 5000 units heparin, and ACT was maintained greater than 200 throughout the case. Catheter exchanged for a 5 French vert catheter, negotiated into lower lobe branch right pulmonary artery, removed over a stiff Amplatz wire. Vascular sheath was upsized, and 24 French sheath placed, and coaxial 24 [place] FlowTriever device advanced into distal interlobar branch right pulmonary artery. Suction thrombectomy performed. Follow-up selective right pulmonary arteriography was obtained. The catheter was then redirected to the origin of the left pulmonary artery for left pulmonary arteriography. The 5 [place] vert catheter was coaxially advanced into a distal lower lobe branch, exchanged for FlowTriever device advanced into distal left pulmonary artery. Suction mechanical thrombectomy performed. Follow-up selective left pulmonary arteriogram obtained. Catheter was retracted into the distal main pulmonary artery for final pulmonary arteriography and pressure measurements. Guidewire, catheter and sheath were removed and hemostasis achieved with aid of 0 silk pursestring suture. The patient tolerated the procedure well. Operators: Vernard Gambles, Mir FLUOROSCOPY  TIME:  Radiation Exposure Index (as provided by the fluoroscopic device): 66.7 mGy air Kerma COMPLICATIONS: None immediate. FINDINGS: Initial right pulmonary arterial pressure  53/12 (27) mmHg. Selective right pulmonary arteriogram shows partially occlusive central embolus across its bifurcation with partially occlusive clot extending into the intralobar branch and lower lobe segmental branches. After right pulmonary arterial selective suction thrombectomy, there is clearance of the central clot with some residual partially occlusive mural thrombus in the interlobar branch, no significant residual large or occlusive clots. Selective left pulmonary arteriogram demonstrates partially occlusive thrombus in the proximal left pulmonary artery extending through its length. After left pulmonary artery selective suction thrombectomy, clearance of thrombus from the left pulmonary artery with improved branch perfusion, good homogeneous parenchymal enhancement of the left lung. Final pulmonary arterial pressure 38/10 (20) mmHg. IMPRESSION: 1. Successful bilateral pulmonary artery mechanical thrombectomy with improvement in pulmonary arterial pressure gradient. 2. Continue heparin . 3. Consider transition to oral anticoagulation. Electronically Signed   By: Lucrezia Europe M.D.   On: 01/28/2022 16:07   IR THROMBECT PRIM MECH INIT (INCLU) MOD SED  Result Date: 01/28/2022 INDICATION: Submassive bilateral central pulmonary emboli with hypoxia RV strain, shortness of breath, tachycardia. Meningioma, subacute and chronic CVAs. Bilateral central pulmonary emboli. EXAM: BILATERAL PULMONARY ARTERIOGRAM RIGHT PULMONARY ARTERIAL THROMBECTOMY LEFT PULMONARY ARTERIAL THROMBECTOMY ULTRASOUND GUIDANCE FOR VASCULAR ACCESS COMPARISON:  CTA 01/26/2022 MEDICATIONS: Lidocaine 1% subcutaneous, heparin 5000 units ANESTHESIA/SEDATION: Intubated, with monitored anesthesia care TECHNIQUE: Informed written consent was obtained from the son after a  thorough discussion of the procedural risks, benefits and alternatives, as the patient was intubated. All questions were addressed. Maximal Sterile Barrier Technique was utilized including caps, mask, sterile gowns, sterile gloves, sterile drape, hand hygiene and skin antiseptic. A timeout was performed prior to the initiation of the procedure. Patency and complete compressibility of right common femoral vein was confirmed with ultrasound. After surgical prep, and local lidocaine administration, micropuncture access to right common femoral vein achieved, exchanged over a Bentson wire for a 7 [place] vascular sheath. 6 French angled pigtail catheter advanced into the right pulmonary artery. Pressure measurements obtained. Pulmonary arteriogram performed. Patient was heparinized with 5000 units heparin, and ACT was maintained greater than 200 throughout the case. Catheter exchanged for a 5 French vert catheter, negotiated into lower lobe branch right pulmonary artery, removed over a stiff Amplatz wire. Vascular sheath was upsized, and  40 French sheath placed, and coaxial 24 [place] FlowTriever device advanced into distal interlobar branch right pulmonary artery. Suction thrombectomy performed. Follow-up selective right pulmonary arteriography was obtained. The catheter was then redirected to the origin of the left pulmonary artery for left pulmonary arteriography. The 5 [place] vert catheter was coaxially advanced into a distal lower lobe branch, exchanged for FlowTriever device advanced into distal left pulmonary artery. Suction mechanical thrombectomy performed. Follow-up selective left pulmonary arteriogram obtained. Catheter was retracted into the distal main pulmonary artery for final pulmonary arteriography and pressure measurements. Guidewire, catheter and sheath were removed and hemostasis achieved with aid of 0 silk pursestring suture. The patient tolerated the procedure well. Operators: Vernard Gambles, Mir FLUOROSCOPY  TIME:  Radiation Exposure Index (as provided by the fluoroscopic device): 66.7 mGy air Kerma COMPLICATIONS: None immediate. FINDINGS: Initial right pulmonary arterial pressure  53/12 (27) mmHg. Selective right pulmonary arteriogram shows partially occlusive central embolus across its bifurcation with partially occlusive clot extending into the intralobar branch and lower lobe segmental branches. After right pulmonary arterial selective suction thrombectomy, there is clearance of the central clot with some residual partially occlusive mural thrombus in the interlobar branch, no significant residual large or occlusive clots. Selective left pulmonary arteriogram demonstrates partially occlusive thrombus in the proximal left pulmonary artery extending through its length. After left pulmonary artery selective suction thrombectomy, clearance of thrombus from the left pulmonary artery with improved branch perfusion, good homogeneous parenchymal enhancement of the left lung. Final pulmonary arterial pressure 38/10 (20) mmHg. IMPRESSION: 1. Successful bilateral pulmonary artery mechanical thrombectomy with improvement in pulmonary arterial pressure gradient. 2. Continue heparin . 3. Consider transition to oral anticoagulation. Electronically Signed   By: Lucrezia Europe M.D.   On: 01/28/2022 16:07   IR Angiogram Pulmonary Bilateral Selective  Result Date: 01/28/2022 INDICATION: Submassive bilateral central pulmonary emboli with hypoxia RV strain, shortness of breath, tachycardia. Meningioma, subacute and chronic CVAs. Bilateral central pulmonary emboli. EXAM: BILATERAL PULMONARY ARTERIOGRAM RIGHT PULMONARY ARTERIAL THROMBECTOMY LEFT PULMONARY ARTERIAL THROMBECTOMY ULTRASOUND GUIDANCE FOR VASCULAR ACCESS COMPARISON:  CTA 01/26/2022 MEDICATIONS: Lidocaine 1% subcutaneous, heparin 5000 units ANESTHESIA/SEDATION: Intubated, with monitored anesthesia care TECHNIQUE: Informed written consent was obtained from the son after a  thorough discussion of the procedural risks, benefits and alternatives, as the patient was intubated. All questions were addressed. Maximal Sterile Barrier Technique was utilized including caps, mask, sterile gowns, sterile gloves, sterile drape, hand hygiene and skin antiseptic. A timeout was performed prior to the initiation of the procedure. Patency and complete compressibility of right common femoral vein was confirmed with ultrasound. After surgical prep, and local lidocaine administration, micropuncture access to right common femoral vein achieved, exchanged over a Bentson wire for a 7 [place] vascular sheath. 6 French angled pigtail catheter advanced into the right pulmonary artery. Pressure measurements obtained. Pulmonary arteriogram performed. Patient was heparinized with 5000 units heparin, and ACT was maintained greater than 200 throughout the case. Catheter exchanged for a 5 French vert catheter, negotiated into lower lobe branch right pulmonary artery, removed over a stiff Amplatz wire. Vascular sheath was upsized, and 24 French sheath placed, and coaxial 24 [place] FlowTriever device advanced into distal interlobar branch right pulmonary artery. Suction thrombectomy performed. Follow-up selective right pulmonary arteriography was obtained. The catheter was then redirected to the origin of the left pulmonary artery for left pulmonary arteriography. The 5 [place] vert catheter was coaxially advanced into a distal lower lobe branch, exchanged for FlowTriever device advanced into distal left pulmonary artery. Suction mechanical thrombectomy  performed. Follow-up selective left pulmonary arteriogram obtained. Catheter was retracted into the distal main pulmonary artery for final pulmonary arteriography and pressure measurements. Guidewire, catheter and sheath were removed and hemostasis achieved with aid of 0 silk pursestring suture. The patient tolerated the procedure well. Operators: Vernard Gambles, Mir FLUOROSCOPY  TIME:  Radiation Exposure Index (as provided by the fluoroscopic device): 66.7 mGy air Kerma COMPLICATIONS: None immediate. FINDINGS: Initial right pulmonary arterial pressure  53/12 (27) mmHg. Selective right pulmonary arteriogram shows partially occlusive central embolus across its bifurcation with partially occlusive clot extending into the intralobar branch and lower lobe segmental branches. After right pulmonary arterial selective suction thrombectomy, there is clearance of the central clot with some residual partially occlusive mural thrombus in the interlobar branch, no significant residual large or occlusive clots. Selective left pulmonary arteriogram demonstrates partially occlusive thrombus in the proximal left pulmonary artery extending through its length. After left pulmonary artery selective suction thrombectomy, clearance of thrombus from the left pulmonary artery with improved branch perfusion, good homogeneous parenchymal enhancement of the left lung. Final pulmonary arterial pressure 38/10 (20) mmHg. IMPRESSION: 1. Successful bilateral pulmonary artery mechanical thrombectomy with improvement in pulmonary arterial pressure gradient. 2. Continue heparin . 3. Consider transition to oral anticoagulation. Electronically Signed   By: Lucrezia Europe M.D.   On: 01/28/2022 16:07   IR Angiogram Selective Each Additional Vessel  Result Date: 01/28/2022 INDICATION: Submassive bilateral central pulmonary emboli with hypoxia RV strain, shortness of breath, tachycardia. Meningioma, subacute and chronic CVAs. Bilateral central pulmonary emboli. EXAM: BILATERAL PULMONARY ARTERIOGRAM RIGHT PULMONARY ARTERIAL THROMBECTOMY LEFT PULMONARY ARTERIAL THROMBECTOMY ULTRASOUND GUIDANCE FOR VASCULAR ACCESS COMPARISON:  CTA 01/26/2022 MEDICATIONS: Lidocaine 1% subcutaneous, heparin 5000 units ANESTHESIA/SEDATION: Intubated, with monitored anesthesia care TECHNIQUE: Informed written consent was obtained from the son after a  thorough discussion of the procedural risks, benefits and alternatives, as the patient was intubated. All questions were addressed. Maximal Sterile Barrier Technique was utilized including caps, mask, sterile gowns, sterile gloves, sterile drape, hand hygiene and skin antiseptic. A timeout was performed prior to the initiation of the procedure. Patency and complete compressibility of right common femoral vein was confirmed with ultrasound. After surgical prep, and local lidocaine administration, micropuncture access to right common femoral vein achieved, exchanged over a Bentson wire for a 7 [place] vascular sheath. 6 French angled pigtail catheter advanced into the right pulmonary artery. Pressure measurements obtained. Pulmonary arteriogram performed. Patient was heparinized with 5000 units heparin, and ACT was maintained greater than 200 throughout the case. Catheter exchanged for a 5 French vert catheter, negotiated into lower lobe branch right pulmonary artery, removed over a stiff Amplatz wire. Vascular sheath was upsized, and 24 French sheath placed, and coaxial 24 [place] FlowTriever device advanced into distal interlobar branch right pulmonary artery. Suction thrombectomy performed. Follow-up selective right pulmonary arteriography was obtained. The catheter was then redirected to the origin of the left pulmonary artery for left pulmonary arteriography. The 5 [place] vert catheter was coaxially advanced into a distal lower lobe branch, exchanged for FlowTriever device advanced into distal left pulmonary artery. Suction mechanical thrombectomy performed. Follow-up selective left pulmonary arteriogram obtained. Catheter was retracted into the distal main pulmonary artery for final pulmonary arteriography and pressure measurements. Guidewire, catheter and sheath were removed and hemostasis achieved with aid of 0 silk pursestring suture. The patient tolerated the procedure well. Operators: Vernard Gambles, Mir FLUOROSCOPY  TIME:  Radiation Exposure Index (as provided by the fluoroscopic device): 66.7 mGy air Kerma COMPLICATIONS: None immediate. FINDINGS: Initial right  pulmonary arterial pressure  53/12 (27) mmHg. Selective right pulmonary arteriogram shows partially occlusive central embolus across its bifurcation with partially occlusive clot extending into the intralobar branch and lower lobe segmental branches. After right pulmonary arterial selective suction thrombectomy, there is clearance of the central clot with some residual partially occlusive mural thrombus in the interlobar branch, no significant residual large or occlusive clots. Selective left pulmonary arteriogram demonstrates partially occlusive thrombus in the proximal left pulmonary artery extending through its length. After left pulmonary artery selective suction thrombectomy, clearance of thrombus from the left pulmonary artery with improved branch perfusion, good homogeneous parenchymal enhancement of the left lung. Final pulmonary arterial pressure 38/10 (20) mmHg. IMPRESSION: 1. Successful bilateral pulmonary artery mechanical thrombectomy with improvement in pulmonary arterial pressure gradient. 2. Continue heparin . 3. Consider transition to oral anticoagulation. Electronically Signed   By: Lucrezia Europe M.D.   On: 01/28/2022 16:07   IR Angiogram Selective Each Additional Vessel  Result Date: 01/28/2022 INDICATION: Submassive bilateral central pulmonary emboli with hypoxia RV strain, shortness of breath, tachycardia. Meningioma, subacute and chronic CVAs. Bilateral central pulmonary emboli. EXAM: BILATERAL PULMONARY ARTERIOGRAM RIGHT PULMONARY ARTERIAL THROMBECTOMY LEFT PULMONARY ARTERIAL THROMBECTOMY ULTRASOUND GUIDANCE FOR VASCULAR ACCESS COMPARISON:  CTA 01/26/2022 MEDICATIONS: Lidocaine 1% subcutaneous, heparin 5000 units ANESTHESIA/SEDATION: Intubated, with monitored anesthesia care TECHNIQUE: Informed written consent was obtained from the son after a  thorough discussion of the procedural risks, benefits and alternatives, as the patient was intubated. All questions were addressed. Maximal Sterile Barrier Technique was utilized including caps, mask, sterile gowns, sterile gloves, sterile drape, hand hygiene and skin antiseptic. A timeout was performed prior to the initiation of the procedure. Patency and complete compressibility of right common femoral vein was confirmed with ultrasound. After surgical prep, and local lidocaine administration, micropuncture access to right common femoral vein achieved, exchanged over a Bentson wire for a 7 [place] vascular sheath. 6 French angled pigtail catheter advanced into the right pulmonary artery. Pressure measurements obtained. Pulmonary arteriogram performed. Patient was heparinized with 5000 units heparin, and ACT was maintained greater than 200 throughout the case. Catheter exchanged for a 5 French vert catheter, negotiated into lower lobe branch right pulmonary artery, removed over a stiff Amplatz wire. Vascular sheath was upsized, and 24 [place] sheath placed, and coaxial 24 [place] FlowTriever device advanced into distal interlobar branch right pulmonary artery. Suction thrombectomy performed. Follow-up selective right pulmonary arteriography was obtained. The catheter was then redirected to the origin of the left pulmonary artery for left pulmonary arteriography. The 5 [place] vert catheter was coaxially advanced into a distal lower lobe branch, exchanged for FlowTriever device advanced into distal left pulmonary artery. Suction mechanical thrombectomy performed. Follow-up selective left pulmonary arteriogram obtained. Catheter was retracted into the distal main pulmonary artery for final pulmonary arteriography and pressure measurements. Guidewire, catheter and sheath were removed and hemostasis achieved with aid of 0 silk pursestring suture. The patient tolerated the procedure well. Operators: Vernard Gambles, Mir FLUOROSCOPY  TIME:  Radiation Exposure Index (as provided by the fluoroscopic device): 66.7 mGy air Kerma COMPLICATIONS: None immediate. FINDINGS: Initial right pulmonary arterial pressure  53/12 (27) mmHg. Selective right pulmonary arteriogram shows partially occlusive central embolus across its bifurcation with partially occlusive clot extending into the intralobar branch and lower lobe segmental branches. After right pulmonary arterial selective suction thrombectomy, there is clearance of the central clot with some residual partially occlusive mural thrombus in the interlobar branch, no significant residual large or occlusive clots. Selective left pulmonary arteriogram demonstrates partially  occlusive thrombus in the proximal left pulmonary artery extending through its length. After left pulmonary artery selective suction thrombectomy, clearance of thrombus from the left pulmonary artery with improved branch perfusion, good homogeneous parenchymal enhancement of the left lung. Final pulmonary arterial pressure 38/10 (20) mmHg. IMPRESSION: 1. Successful bilateral pulmonary artery mechanical thrombectomy with improvement in pulmonary arterial pressure gradient. 2. Continue heparin . 3. Consider transition to oral anticoagulation. Electronically Signed   By: Lucrezia Europe M.D.   On: 01/28/2022 16:07   ECHOCARDIOGRAM LIMITED  Result Date: 01/26/2022    ECHOCARDIOGRAM LIMITED REPORT   Patient Name:   ALIECE HONOLD Date of Exam: 01/26/2022 Medical Rec #:  [id number]    Height:       63.0 in Accession #:    [id number]   Weight:       229.5 lb Date of Birth:  [birth year]    BSA:          2.050 m Patient Age:    15 years     BP:           108/70 mmHg Patient Gender: F            HR:           121 bpm. Exam Location:  Inpatient Procedure: Limited Echo, Color Doppler and Cardiac Doppler STAT ECHO Indications:    pulmonary embolus  History:        Patient has prior history of Echocardiogram examinations. Cor                 pulmonale,  chronic kidney disease; Risk Factors:Hypertension,                 Dyslipidemia and Diabetes.  Sonographer:    Johny Chess RDCS Referring Phys: [phone] MATTHEW R HUNSUCKER  Sonographer Comments: Technically difficult study due to poor echo windows and echo performed with patient supine and on artificial respirator. Image acquisition challenging due to respiratory motion. IMPRESSIONS  1. There is a suggestion of systolic anterior motion of the mitral valve, probably due to the hyperdynamic LV contractility and underfilled left ventricle. Doppler interrogation of LVOT gradients was not performed. Left ventricular ejection fraction, by  estimation, is >75%. The left ventricle has hyperdynamic function. The left ventricle has no regional wall motion abnormalities. The interventricular septum is flattened in systole and diastole, consistent with right ventricular pressure and volume overload.  2. Right ventricular systolic function is moderately reduced. The right ventricular size is moderately enlarged. There is severely elevated pulmonary artery systolic pressure.  3. Right atrial size was mildly dilated.  4. The mitral valve is normal in structure. No evidence of mitral valve regurgitation.  5. The tricuspid valve is abnormal. Tricuspid valve regurgitation is moderate to severe.  6. The aortic valve is tricuspid. Aortic valve regurgitation is not visualized. Aortic valve sclerosis/calcification is present, without any evidence of aortic stenosis.  7. The inferior vena cava is dilated in size with <50% respiratory variability, suggesting right atrial pressure of 15 mmHg. FINDINGS  Left Ventricle: There is a suggestion of systolic anterior motion of the mitral valve, probably due to the hyperdynamic LV contractility and underfilled left ventricle. Doppler interrogation of LVOT gradients was not performed. Left ventricular ejection  fraction, by estimation, is >75%. The left ventricle has hyperdynamic function. The  left ventricle has no regional wall motion abnormalities. The left ventricular internal cavity size was small. The interventricular septum is flattened in systole and diastole, consistent with right ventricular  pressure and volume overload. Right Ventricle: The right ventricular size is moderately enlarged. Right ventricular systolic function is moderately reduced. There is severely elevated pulmonary artery systolic pressure. The tricuspid regurgitant velocity is 4.11 m/s, and with an assumed right atrial pressure of 15 mmHg, the estimated right ventricular systolic pressure is 02.5 mmHg. Left Atrium: Left atrial size was normal in size. Right Atrium: Right atrial size was mildly dilated. Prominent Eustachian valve. Pericardium: There is no evidence of pericardial effusion. Mitral Valve: The mitral valve is normal in structure. Mild mitral annular calcification. Tricuspid Valve: The tricuspid valve is abnormal. Tricuspid valve regurgitation is moderate to severe. Aortic Valve: The noncoronary cusp is rigid and calcified. The aortic valve is tricuspid. Aortic valve regurgitation is not visualized. Aortic valve sclerosis/calcification is present, without any evidence of aortic stenosis. Pulmonic Valve: The pulmonic valve was grossly normal. Pulmonic valve regurgitation is mild. Venous: The inferior vena cava is dilated in size with less than 50% respiratory variability, suggesting right atrial pressure of 15 mmHg. IAS/Shunts: No atrial level shunt detected by color flow Doppler. RIGHT VENTRICLE             IVC RV S prime:     10.10 cm/s  IVC diam: 2.00 cm TAPSE (M-mode): 1.4 cm TRICUSPID VALVE TR Peak grad:   67.6 mmHg TR Vmax:        411.00 cm/s Dani Gobble Croitoru MD Electronically signed by Sanda Klein MD Signature Date/Time: 01/26/2022/3:34:11 PM    Final    CT Head Wo Contrast  Result Date: 01/26/2022 CLINICAL DATA:  81 year old female with altered mental status, code sepsis, tachycardia, tachypnea,  hyperglycemia. None meningioma. EXAM: CT HEAD WITHOUT CONTRAST TECHNIQUE: Contiguous axial images were obtained from the base of the skull through the vertex without intravenous contrast. RADIATION DOSE REDUCTION: This exam was performed according to the departmental dose-optimization program which includes automated exposure control, adjustment of the mA and/or kV according to patient size and/or use of iterative reconstruction technique. COMPARISON:  Brain MRI 01/13/2022.  Head CT 01/16/2022. 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 Regional mass effect is stable. Basilar cisterns remain patent. Streak artifact through the superior hemispheres today. No acute intracranial hemorrhage identified. No cortically based acute infarct identified. Fairly extensive chronic ischemic disease in the brainstem, cerebellum, left thalamus. Vascular: Extensive Calcified atherosclerosis at the skull base. Chronic basilar artery vascular stent. No suspicious intracranial vascular hyperdensity. Skull: No acute osseous abnormality identified. Sinuses/Orbits: Visualized paranasal sinuses and mastoids are stable and well aerated. Other: Layering fluid in the visible pharynx. The patient is intubated on contemporary CTA chest. No acute orbit or scalp soft tissue finding. IMPRESSION: 1. Stable non contrast CT appearance of the brain, with large chronic left planum sphenoidale meningioma with associated regional edema and mass effect is stable from last month. 2. Advanced chronic ischemic disease.  Chronic Basilar Artery stent. 3.  No new intracranial abnormality. Electronically Signed   By: Genevie Ann M.D.   On: 01/26/2022 13:00   CT Angio Chest/Abd/Pel for Dissection W and/or Wo Contrast  Result Date: 01/26/2022 CLINICAL DATA:  81 year old female with altered mental status, code sepsis, tachycardia,  tachypnea, hyperglycemia. EXAM: CT ANGIOGRAPHY CHEST, ABDOMEN AND PELVIS TECHNIQUE: Non-contrast CT of the chest was initially obtained. Multidetector CT imaging through the chest, abdomen and pelvis was performed using the standard protocol during bolus administration of intravenous contrast. Multiplanar reconstructed images and  MIPs were obtained and reviewed to evaluate the vascular anatomy. RADIATION DOSE REDUCTION: This exam was performed according to the departmental dose-optimization program which includes automated exposure control, adjustment of the mA and/or kV according to patient size and/or use of iterative reconstruction technique. CONTRAST:  44m OMNIPAQUE IOHEXOL 350 MG/ML SOLN COMPARISON:  No prior chest CTA.  CT Abdomen and Pelvis 03/30/2019. FINDINGS: CTA CHEST FINDINGS Cardiovascular: Pulmonary artery saddle embolus. Bilateral lobar pulmonary artery thrombus. Abnormal RV LV ratio 1.5. Calcified aortic atherosclerosis. Calcified coronary artery atherosclerosis. No cardiomegaly or pericardial effusion. Mediastinum/Nodes: Negative. Enteric tube courses through the esophagus. Lungs/Pleura: Intubated. Endotracheal tube tip in good position above the carina. Major airways remain patent. Mild dependent opacity in the lungs is greater on the right and is enhancing more suggestive of atelectasis than pulmonary infarct. No pleural effusion. Musculoskeletal: No acute osseous abnormality identified. Review of the MIP images confirms the above findings. CTA ABDOMEN AND PELVIS FINDINGS VASCULAR Reflux of contrast to the hepatic IVC, hepatic veins, and also into the infrarenal IVC and pelvic veins. Extensive Aortoiliac calcified atherosclerosis. Negative for aortic dissection or aneurysm. Major arterial structures in the abdomen and pelvis remain patent. There is a left femoral venous catheter in place. Review of the MIP images confirms the above findings. NON-VASCULAR Hepatobiliary: Contrast reflux into the  liver which otherwise appears negative. Negative gallbladder. Pancreas: Partially atrophied. Spleen: Negative. Adrenals/Urinary Tract: Adrenal glands and kidneys appears stable and negative. Foley catheter is in place within the bladder, which is mostly obscured by pelvis hip arthroplasty artifact. Stomach/Bowel: Rectosigmoid retained stool. No dilated large or small bowel. Widespread large bowel diverticulosis. No free air or free fluid identified. Negative appendix visible on series 6, image 24. No convincing bowel inflammation. Enteric tube terminates in the distal gastric body. Lymphatic: No lymphadenopathy identified. Reproductive: Mostly obscured by hip arthroplasty streak artifact. Other: No pelvic free fluid. Musculoskeletal: Advanced lumbar spine degeneration. Chronic right hip arthroplasty. No acute osseous abnormality identified. Review of the MIP images confirms the above findings. Study discussed by telephone with Dr. KMatilde Sprangon 01/26/2022 at 12:48 . IMPRESSION: 1. Positive for Acute PE with Saddle Embolus and Right Heart Strain (RV/LV Ratio = 1.5) consistent with at least Submassive (intermediate risk) PE. The presence of right heart strain has been associated with an increased risk of morbidity and mortality. Please refer to the "Code PE Focused" order set in EPIC. Critical Value/emergent results were called by telephone at the time of interpretation on 01/26/2022 at at 1248 hours to Dr. KMatilde Sprang who verbally acknowledged these results. 2. No pleural effusion, and mild dependent lung opacity is more suggestive of atelectasis than pulmonary infarct. 3. No acute or inflammatory process identified in the abdomen or pelvis. Widespread large bowel diverticulosis without evidence of acute diverticulitis. 4. Satisfactory ET tube, enteric tube, Foley catheter placement. 5. Aortic Atherosclerosis (ICD10-I70.0). Electronically Signed   By: HGenevie AnnM.D.   On: 01/26/2022 12:54   DG Chest Port 1 View  Result Date:  01/26/2022 CLINICAL DATA:  Intubation. EXAM: PORTABLE CHEST 1 VIEW COMPARISON:  01/26/2022 FINDINGS: An endotracheal tube with tip 4 cm above the carina, and enteric tube entering the stomach with tip off the field of view noted. Defibrillator/pacing pad overlying the chest now noted. The cardiomediastinal silhouette is unremarkable. There is no evidence of focal airspace disease, pulmonary edema, suspicious pulmonary nodule/mass, pleural effusion, or pneumothorax. No acute bony abnormalities are identified. IMPRESSION: Endotracheal and enteric tubes as described. No evidence of acute cardiopulmonary disease. Electronically  Signed   By: Margarette Canada M.D.   On: 01/26/2022 10:07   DG Chest Port 1 View  Result Date: 01/26/2022 CLINICAL DATA:  Shortness of breath EXAM: PORTABLE CHEST 1 VIEW COMPARISON:  Multiple chest x-rays, most recently January 11, 2022 FINDINGS: Slightly widened mediastinal contours.  Normal heart size. No focal pulmonary opacity. No large pleural effusion or pneumothorax. The visualized upper abdomen is unremarkable. No acute osseous abnormality. IMPRESSION: Slightly widened mediastinal contours, which may be positional. However, if there is clinical concern for vascular injury, recommend further assessment with chest CT angiogram. Electronically Signed   By: Beryle Flock M.D.   On: 01/26/2022 09:37    Microbiology: Results for orders placed or performed during the hospital encounter of 01/26/22  Resp Panel by RT-PCR (Flu A&B, Covid) Anterior Nasal Swab     Status: None   Collection Time: 01/26/22  9:10 AM   Specimen: Anterior Nasal Swab  Result Value Ref Range Status   SARS Coronavirus 2 by RT PCR NEGATIVE NEGATIVE Final    Comment: (NOTE) SARS-CoV-2 target nucleic acids are NOT DETECTED.  The SARS-CoV-2 RNA is generally detectable in upper respiratory specimens during the acute phase of infection. The lowest concentration of SARS-CoV-2 viral copies this assay can detect  is 138 copies/mL. A negative result does not preclude SARS-Cov-2 infection and should not be used as the sole basis for treatment or other patient management decisions. A negative result may occur with  improper specimen collection/handling, submission of specimen other than nasopharyngeal swab, presence of viral mutation(s) within the areas targeted by this assay, and inadequate number of viral copies(<138 copies/mL). A negative result must be combined with clinical observations, patient history, and epidemiological information. The expected result is Negative.  Fact Sheet for Patients:  EntrepreneurPulse.com.au  Fact Sheet for Healthcare Providers:  IncredibleEmployment.be  This test is no t yet approved or cleared by the [place] FDA and  has been authorized for detection and/or diagnosis of SARS-CoV-2 by FDA under an Emergency Use Authorization (EUA). This EUA will remain  in effect (meaning this test can be used) for the duration of the COVID-19 declaration under Section 564(b)(1) of the Act, 21 U.S.C.section 360bbb-3(b)(1), unless the authorization is terminated  or revoked sooner.       Influenza A by PCR NEGATIVE NEGATIVE Final   Influenza B by PCR NEGATIVE NEGATIVE Final    Comment: (NOTE) The Xpert Xpress SARS-CoV-2/FLU/RSV plus assay is intended as an aid in the diagnosis of influenza from Nasopharyngeal swab specimens and should not be used as a sole basis for treatment. Nasal washings and aspirates are unacceptable for Xpert Xpress SARS-CoV-2/FLU/RSV testing.  Fact Sheet for Patients: EntrepreneurPulse.com.au  Fact Sheet for Healthcare Providers: IncredibleEmployment.be  This test is not yet approved or cleared by the [place] FDA and has been authorized for detection and/or diagnosis of SARS-CoV-2 by FDA under an Emergency Use Authorization (EUA). This EUA will remain in effect  (meaning this test can be used) for the duration of the COVID-19 declaration under Section 564(b)(1) of the Act, 21 U.S.C. section 360bbb-3(b)(1), unless the authorization is terminated or revoked.  Performed at [facility], [place] [address] [place], [place] [address]   Culture, blood (routine x 2)     Status: None   Collection Time: 01/26/22 11:38 AM   Specimen: BLOOD  Result Value Ref Range Status   Specimen Description BLOOD  Final   Special Requests   Final    BOTTLES DRAWN  AEROBIC AND ANAEROBIC Blood Culture adequate volume   Culture   Final    NO GROWTH 5 DAYS Performed at [facility], [place] [address] [place], [place] [address]    Report Status 01/31/2022 FINAL  Final  MRSA Next Gen by PCR, Nasal     Status: None   Collection Time: 01/26/22  2:36 PM   Specimen: Nasal Mucosa; Nasal Swab  Result Value Ref Range Status   MRSA by PCR Next Gen NOT DETECTED NOT DETECTED Final    Comment: (NOTE) The GeneXpert MRSA Assay (FDA approved for NASAL specimens only), is one component of a comprehensive MRSA colonization surveillance program. It is not intended to diagnose MRSA infection nor to guide or monitor treatment for MRSA infections. Test performance is not FDA approved in patients less than 82 years old. Performed at [facility], [place] [address], [place], [place] [address]   Urine Culture     Status: None   Collection Time: 01/26/22  2:52 PM   Specimen: Urine, Catheterized  Result Value Ref Range Status   Specimen Description URINE, CATHETERIZED  Final   Special Requests NONE  Final   Culture   Final    NO GROWTH Performed at [facility], [address], [place], [place] [address]    Report Status 01/27/2022 FINAL  Final  Expectorated Sputum Assessment w Gram Stain, Rflx to Resp Cult     Status: None   Collection Time: 01/30/22  9:49 AM   Specimen: Expectorated Sputum  Result Value Ref Range Status   Specimen Description EXPECTORATED SPUTUM   Final   Special Requests NONE  Final   Sputum evaluation   Final    THIS SPECIMEN IS ACCEPTABLE FOR SPUTUM CULTURE Performed at [facility], [place] [address], [place], [place] [address]    Report Status 02/01/2022 FINAL  Final  Culture, Respiratory w Gram Stain     Status: None   Collection Time: 01/30/22  9:49 AM  Result Value Ref Range Status   Specimen Description EXPECTORATED SPUTUM  Final   Special Requests NONE Reflexed from [id number]  Final   Gram Stain   Final    MODERATE WBC PRESENT,BOTH PMN AND MONONUCLEAR FEW GRAM POSITIVE COCCI IN CLUSTERS FEW GRAM NEGATIVE RODS FEW YEAST WITH PSEUDOHYPHAE Performed at [facility], [place] [address], [place], [place] [address]    Culture   Final    MODERATE KLEBSIELLA PNEUMONIAE Confirmed Extended Spectrum Beta-Lactamase Producer (ESBL).  In bloodstream infections from ESBL organisms, carbapenems are preferred over piperacillin/tazobactam. They are shown to have a lower risk of mortality.    Report Status 02/01/2022 FINAL  Final   Organism ID, Bacteria KLEBSIELLA PNEUMONIAE  Final      Susceptibility   Klebsiella pneumoniae - MIC*    AMPICILLIN >=32 RESISTANT Resistant     CEFAZOLIN >=64 RESISTANT Resistant     CEFEPIME >=32 RESISTANT Resistant     CEFTAZIDIME RESISTANT Resistant     CEFTRIAXONE >=64 RESISTANT Resistant     CIPROFLOXACIN <=0.25 SENSITIVE Sensitive     GENTAMICIN <=1 SENSITIVE Sensitive     IMIPENEM <=0.25 SENSITIVE Sensitive     TRIMETH/SULFA >=320 RESISTANT Resistant     AMPICILLIN/SULBACTAM >=32 RESISTANT Resistant     PIP/TAZO 16 SENSITIVE Sensitive     * MODERATE KLEBSIELLA PNEUMONIAE    Labs: CBC: No results for input(s): "WBC", "NEUTROABS", "HGB", "HCT", "MCV", "PLT" in the last 168 hours. Basic Metabolic Panel: Recent  Labs  Lab 02/09/22 1554 02/10/22 0349 02/11/22 0331  NA 144 143 142  K 3.7 3.6 3.7  CL 112* 110 111  CO2 '27 27 26  '$ GLUCOSE 111* 123* 171*  BUN 19 17 31*  CREATININE 0.51  0.49 0.71  CALCIUM 9.1 9.2 9.2   Liver Function Tests: No results for input(s): "AST", "ALT", "ALKPHOS", "BILITOT", "PROT", "ALBUMIN" in the last 168 hours. CBG: Recent Labs  Lab 02/15/22 2101 02/15/22 2235 02/15/22 2354 02/16/22 0403 02/16/22 0819  GLUCAP 154* 98 99 105* 231*    Discharge time spent: greater than 30 minutes.  This record has been created using Systems analyst. Errors have been sought and corrected,but may not always be located. Such creation errors do not reflect on the standard of care.   Signed: Lorella Nimrod, MD [facility] 02/16/2022

## 2022-02-16 NOTE — TOC Transition Note (Addendum)
Transition of Care [facility]) - CM/SW Discharge Note   Patient Details  Name: Cassandra Dodson MRN: [id number] Date of Birth: June 12, 1940  [facility] CM/SW Contact:  Milinda Antis, [place] Phone Number: 02/16/2022, 11:01 AM   Clinical Narrative:    Patient will DC to: [facility] Anticipated DC date: 02/16/2022 Family notified: Yes (VM left for patient's son Cassandra Dodson) Transport by: Charisse Klinefelter  Per MD patient ready for DC to SNF. RN to call report prior to discharge [phone] room 224). RN, patient, patient's family, and facility notified of DC. Discharge Summary to facility. DC packet on chart. Ambulance transport wil be requested for patient.   CSW will sign off for now as social work intervention is no longer needed. Please consult [place] again if new needs arise.     Final next level of care: Skilled Nursing Facility Barriers to Discharge: Continued Medical Work up, SNF Pending bed offer   Patient Goals and CMS Choice   CMS Medicare.gov Compare Post Acute Care list provided to:: Patient Represenative (must comment) (son Cassandra Dodson) Choice offered to / list presented to : Adult Children  Discharge Placement              Patient [facility] bed at:  [facility])   Name of family member notified: Yes Patient and family notified of of transfer: 02/16/22  Discharge Plan and Services In-house Referral: Clinical Social Work   Post Acute Care Choice: [place]                               Social Determinants of Health (SDOH) Interventions     Readmission Risk Interventions     No data to display

## 2022-02-16 NOTE — TOC Progression Note (Signed)
Transition of Care [facility]) - Initial/Assessment Note    Patient Details  Name: Cassandra Dodson MRN: [id number] Date of Birth: Jul 02, 1940  Transition of Care [facility]) CM/SW Contact:    Milinda Antis, LCSWA Phone Number: 02/16/2022, 9:18 AM  Clinical Narrative:                 The SNF denial has been overturned and the patient is approved to admit to SNF from 10/27-10/31.  LCSW spoke with Tanza at [facility] to inquire about the patient admitting today.  Valda Favia will contact LCSW with the determination.    TOC will continue to follow.  Expected Discharge Plan: Skilled Nursing Facility Barriers to Discharge: Continued Medical Work up, SNF Pending bed offer   Patient Goals and CMS Choice   CMS Medicare.gov Compare Post Acute Care list provided to:: Patient Represenative (must comment) (son [name]) Choice offered to / list presented to : Adult Children  Expected Discharge Plan and Services Expected Discharge Plan: [place] In-house Referral: Clinical Social Work   Post Acute Care Choice: [place] Living arrangements for the past 2 months: [place] Expected Discharge Date: 02/13/22                                    Prior Living Arrangements/Services Living arrangements for the past 2 months: Single Family Home Lives with:: Self          Need for Family Participation in Patient Care: Yes (Comment) Care giver support system in place?: Yes (comment) Current home services: Home PT, Home OT (son not sure which [facility] agency) Criminal Activity/Legal Involvement Pertinent to Current Situation/Hospitalization: No - Comment as needed  Activities of Daily Living Home Assistive Devices/Equipment: Wheelchair ADL Screening (condition at time of admission) Patient's cognitive ability adequate to safely complete daily activities?: No Is the patient deaf or have difficulty hearing?: No Does the patient have difficulty seeing, even when  wearing glasses/contacts?: No Does the patient have difficulty concentrating, remembering, or making decisions?: Yes Patient able to express need for assistance with ADLs?: No Does the patient have difficulty dressing or bathing?: Yes Independently performs ADLs?: No Communication: Appropriate for developmental age Dressing (OT): Dependent Is this a change from baseline?: Pre-admission baseline Grooming: Needs assistance Is this a change from baseline?: Pre-admission baseline Feeding: Dependent Is this a change from baseline?: Pre-admission baseline Bathing: Dependent Is this a change from baseline?: Pre-admission baseline Toileting: Dependent Is this a change from baseline?: Pre-admission baseline In/Out Bed: Needs assistance Is this a change from baseline?: Pre-admission baseline Walks in Home: Dependent Is this a change from baseline?: Pre-admission baseline Does the patient have difficulty walking or climbing stairs?: Yes Weakness of Legs: Both Weakness of Arms/Hands: Left  Permission Sought/Granted                  Emotional Assessment Appearance:: Appears stated age Attitude/Demeanor/Rapport: Unable to Assess Affect (typically observed): Unable to Assess Orientation: : Oriented to Self, Oriented to Place, Oriented to Situation Alcohol / Substance Use: Not Applicable Psych Involvement: No (comment)  Admission diagnosis:  Pulmonary embolism with acute cor pulmonale (HCC) [I26.09] Acute saddle pulmonary embolism with acute cor pulmonale (HCC) [I26.02] Patient Active Problem List   Diagnosis Date Noted   Malnutrition of moderate degree 02/02/2022   Pulmonary embolism with acute cor pulmonale ([place]) 01/26/2022   AMS (altered mental status) 01/11/2022   Dysphagia  11/28/2021   Anemia 11/28/2021   Fluctuating mental status 11/28/2021   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   Pressure injury of skin 11/02/2021   Occlusion and stenosis of basilar  artery 11/01/2021   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   UTI (urinary tract infection) 10/27/2021   Intracranial mass    Hyperlipidemia    Type 2 diabetes mellitus with stage 3b chronic kidney disease, without long-term current use of insulin ([place])    CVA (cerebral vascular accident) ([place]) 10/26/2021   Stage 3b chronic kidney disease ([place])    Thrombocytopenia ([place])    History of endometrial cancer    Brain mass 10/25/2021   Endometrial cancer ([place]) 05/13/2019   Hypertension    Total knee replacement status 06/27/2016   Primary osteoarthritis of left knee 04/30/2016   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 [place] [address] Phone: [phone] Fax: [phone]     Social Determinants of Health (SDOH) Interventions    Readmission Risk Interventions     No data to display

## 2022-02-16 NOTE — Progress Notes (Signed)
Occupational Therapy Treatment Patient Details Name: Cassandra Dodson MRN: [id number] DOB: 1940-12-17 Today's Date: 02/16/2022   History of present illness 81 y.o. female admitted 01/26/22 with AMS, patient found to have a pulmonary embolism. Recent admit 9/23 with with UTI. 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   OT comments  Pt making minimal, slow progress. Pt required max A to sit EOB with increased time and effort to complete and demonstrated posterior and R side leaning requiring mod A for balance/support. Pt participated in simple grooming and UB dressing and overall sitting tolerance seated EOB. Standing not attempted due to poor sitting balance and participation. Pt became irritated/agitated after sitting EOB x 11-12 minutes and pushing back posteriorly and also stating "no". Pt assisted back in supine with L UE positioned in elevation to manage edema. OT to continue to follow acutely to maximize level of function and safety   Recommendations for follow up therapy are one component of a multi-disciplinary discharge planning process, led by the attending physician.  Recommendations may be updated based on patient status, additional functional criteria and insurance authorization.    Follow Up Recommendations  Skilled nursing-short term rehab (<3 hours/day)    Assistance Recommended at Discharge    Patient can return home with the following  Direct supervision/assist for medications management;Direct supervision/assist for financial management;Assist for transportation;Help with stairs or ramp for entrance;Two people to help with bathing/dressing/bathroom;Assistance with cooking/housework;Two people to help with walking and/or transfers   Equipment Recommendations  Other (comment) (TBD at next venue of care)    Recommendations for Other Services       Precautions / Restrictions Precautions Precautions: Fall;Other (comment) Precaution Comments: LUE residual weakness due to hx of CVAs and increased edema Restrictions Weight Bearing Restrictions: No       Mobility Bed Mobility Overal bed mobility: Needs Assistance Bed Mobility: Supine to Sit, Sit to Supine Rolling: Max assist   Supine to sit: Max assist Sit to supine: Total assist   General bed mobility comments: heavy max A with LE amgt and trunk elevation, scooting. Increased time and effort required    Transfers                   General transfer comment: pt declines, and pt fatiguing sitting EOB with activity     Balance Overall balance assessment: Needs assistance Sitting-balance support: Feet supported, Bilateral upper extremity supported Sitting balance-Leahy Scale: Poor Sitting balance - Comments: UB/trunk posterior support Postural control: Posterior lean, R side leaning                                 ADL either performed or assessed with clinical judgement   ADL Overall ADL's : Needs assistance/impaired     Grooming: Wash/dry hands;Wash/dry face;Independent;Sitting Grooming Details (indicate cue type and reason): hand over hand to initate, trunk support mod A for balance Upper Body Bathing: Moderate assistance;Standing Upper Body Bathing Details (indicate cue type and reason): hand over hand to initate, trunk support mod A for balance     Upper Body Dressing : Moderate assistance Upper Body Dressing Details (indicate cue type and reason): hand over hand to initate, trunk support mod A for balance, required assistance with L UE to doff and Scientist, clinical (histocompatibility and immunogenetics) Details (indicate  cue type and reason): deferred Toileting- Clothing Manipulation and Hygiene: Total assistance;Bed level         General ADL Comments: pt became irritated/agitated after sitting EOB x 11-12 minutes and pushing back posteriorly and also  stating "no"    Extremity/Trunk Assessment Upper Extremity Assessment Upper Extremity Assessment: Generalized weakness       Cervical / Trunk Assessment Cervical / Trunk Assessment: Kyphotic    Vision Patient Visual Report: No change from baseline     Perception     Praxis      Cognition Arousal/Alertness: Awake/alert Behavior During Therapy: Flat affect Overall Cognitive Status: Impaired/Different from baseline Area of Impairment: Following commands, Awareness, Attention, Orientation                     Memory: Decreased short-term memory Following Commands: Follows one step commands inconsistently Safety/Judgement: Decreased awareness of safety, Decreased awareness of deficits   Problem Solving: Slow processing, Decreased initiation, Requires verbal cues, Requires tactile cues, Difficulty sequencing          Exercises      Shoulder Instructions       General Comments      Pertinent Vitals/ Pain       Pain Assessment Pain Assessment: Faces Faces Pain Scale: Hurts little more Pain Location: LUE Pain Descriptors / Indicators: Discomfort, Grimacing, Moaning, Guarding Pain Intervention(s): Monitored during session, Repositioned, Limited activity within patient's tolerance  Home Living                                          Prior Functioning/Environment              Frequency  Min 2X/week        Progress Toward Goals  OT Goals(current goals can now be found in the care plan section)  Progress towards OT goals: OT to reassess next treatment     Plan Discharge plan remains appropriate;Frequency remains appropriate    Co-evaluation                 AM-PAC OT "6 Clicks" Daily Activity     Outcome Measure   Help from another person eating meals?: A Little Help from another person taking care of personal grooming?: A Little Help from another person toileting, which includes using toliet, bedpan, or urinal?:  Total Help from another person bathing (including washing, rinsing, drying)?: Total Help from another person to put on and taking off regular upper body clothing?: A Lot Help from another person to put on and taking off regular lower body clothing?: Total 6 Click Score: 11    End of Session    OT Visit Diagnosis: Unsteadiness on feet (R26.81);Other abnormalities of gait and mobility (R26.89);Muscle weakness (generalized) (M62.81);Other symptoms and signs involving the nervous system (R29.898);Other symptoms and signs involving cognitive function;Pain Pain - Right/Left: Left Pain - part of body: Arm;Hand   Activity Tolerance Patient limited by fatigue;Treatment limited secondary to agitation   Patient Left in bed;with call bell/phone within reach;with bed alarm set   Nurse Communication          Time: 7858-8502 OT Time Calculation (min): 23 min  Charges: OT General Charges $OT Visit: 1 Visit OT Treatments $Self Care/Home Management : 8-22 mins $Therapeutic Activity: 8-22 mins    Britt Bottom 02/16/2022, 2:27 PM

## 2022-03-01 ENCOUNTER — Emergency Department (HOSPITAL_COMMUNITY): Payer: Medicare Other

## 2022-03-01 ENCOUNTER — Inpatient Hospital Stay (HOSPITAL_COMMUNITY)
Admission: EM | Admit: 2022-03-01 | Discharge: 2022-03-06 | DRG: 064 | Disposition: A | Discharge status: Skilled Nursing Facility | Payer: Medicare Other | Attending: Internal Medicine | Admitting: Internal Medicine

## 2022-03-01 ENCOUNTER — Other Ambulatory Visit: Payer: Self-pay

## 2022-03-01 ENCOUNTER — Inpatient Hospital Stay (HOSPITAL_COMMUNITY): Payer: Medicare Other

## 2022-03-01 DIAGNOSIS — I639 Cerebral infarction, unspecified: Principal | ICD-10-CM

## 2022-03-01 DIAGNOSIS — N183 Chronic kidney disease, stage 3 unspecified: Secondary | ICD-10-CM | POA: Diagnosis present

## 2022-03-01 DIAGNOSIS — Z1152 Encounter for screening for COVID-19: Secondary | ICD-10-CM

## 2022-03-01 DIAGNOSIS — E1122 Type 2 diabetes mellitus with diabetic chronic kidney disease: Secondary | ICD-10-CM | POA: Diagnosis present

## 2022-03-01 DIAGNOSIS — Z7982 Long term (current) use of aspirin: Secondary | ICD-10-CM

## 2022-03-01 DIAGNOSIS — I619 Nontraumatic intracerebral hemorrhage, unspecified: Secondary | ICD-10-CM | POA: Diagnosis present

## 2022-03-01 DIAGNOSIS — I69354 Hemiplegia and hemiparesis following cerebral infarction affecting left non-dominant side: Secondary | ICD-10-CM | POA: Diagnosis not present

## 2022-03-01 DIAGNOSIS — F32A Depression, unspecified: Secondary | ICD-10-CM | POA: Diagnosis present

## 2022-03-01 DIAGNOSIS — Z8542 Personal history of malignant neoplasm of other parts of uterus: Secondary | ICD-10-CM | POA: Diagnosis not present

## 2022-03-01 DIAGNOSIS — G936 Cerebral edema: Secondary | ICD-10-CM | POA: Diagnosis present

## 2022-03-01 DIAGNOSIS — D320 Benign neoplasm of cerebral meninges: Secondary | ICD-10-CM | POA: Diagnosis present

## 2022-03-01 DIAGNOSIS — Z794 Long term (current) use of insulin: Secondary | ICD-10-CM

## 2022-03-01 DIAGNOSIS — C541 Malignant neoplasm of endometrium: Secondary | ICD-10-CM | POA: Diagnosis present

## 2022-03-01 DIAGNOSIS — R1310 Dysphagia, unspecified: Secondary | ICD-10-CM | POA: Diagnosis present

## 2022-03-01 DIAGNOSIS — I6930 Unspecified sequelae of cerebral infarction: Secondary | ICD-10-CM | POA: Diagnosis not present

## 2022-03-01 DIAGNOSIS — D631 Anemia in chronic kidney disease: Secondary | ICD-10-CM | POA: Diagnosis present

## 2022-03-01 DIAGNOSIS — I6322 Cerebral infarction due to unspecified occlusion or stenosis of basilar arteries: Secondary | ICD-10-CM | POA: Diagnosis not present

## 2022-03-01 DIAGNOSIS — E1142 Type 2 diabetes mellitus with diabetic polyneuropathy: Secondary | ICD-10-CM | POA: Diagnosis present

## 2022-03-01 DIAGNOSIS — Z96641 Presence of right artificial hip joint: Secondary | ICD-10-CM | POA: Diagnosis present

## 2022-03-01 DIAGNOSIS — I63431 Cerebral infarction due to embolism of right posterior cerebral artery: Secondary | ICD-10-CM | POA: Diagnosis not present

## 2022-03-01 DIAGNOSIS — I63531 Cerebral infarction due to unspecified occlusion or stenosis of right posterior cerebral artery: Secondary | ICD-10-CM | POA: Diagnosis present

## 2022-03-01 DIAGNOSIS — D696 Thrombocytopenia, unspecified: Secondary | ICD-10-CM | POA: Diagnosis not present

## 2022-03-01 DIAGNOSIS — E039 Hypothyroidism, unspecified: Secondary | ICD-10-CM | POA: Diagnosis present

## 2022-03-01 DIAGNOSIS — Z823 Family history of stroke: Secondary | ICD-10-CM

## 2022-03-01 DIAGNOSIS — Z87440 Personal history of urinary (tract) infections: Secondary | ICD-10-CM

## 2022-03-01 DIAGNOSIS — Z8673 Personal history of transient ischemic attack (TIA), and cerebral infarction without residual deficits: Secondary | ICD-10-CM | POA: Diagnosis not present

## 2022-03-01 DIAGNOSIS — D649 Anemia, unspecified: Secondary | ICD-10-CM | POA: Diagnosis present

## 2022-03-01 DIAGNOSIS — E785 Hyperlipidemia, unspecified: Secondary | ICD-10-CM | POA: Diagnosis present

## 2022-03-01 DIAGNOSIS — Z87891 Personal history of nicotine dependence: Secondary | ICD-10-CM | POA: Diagnosis not present

## 2022-03-01 DIAGNOSIS — D329 Benign neoplasm of meninges, unspecified: Secondary | ICD-10-CM | POA: Diagnosis present

## 2022-03-01 DIAGNOSIS — Z7901 Long term (current) use of anticoagulants: Secondary | ICD-10-CM

## 2022-03-01 DIAGNOSIS — I129 Hypertensive chronic kidney disease with stage 1 through stage 4 chronic kidney disease, or unspecified chronic kidney disease: Secondary | ICD-10-CM | POA: Diagnosis present

## 2022-03-01 DIAGNOSIS — Z86711 Personal history of pulmonary embolism: Secondary | ICD-10-CM

## 2022-03-01 DIAGNOSIS — K219 Gastro-esophageal reflux disease without esophagitis: Secondary | ICD-10-CM | POA: Diagnosis present

## 2022-03-01 DIAGNOSIS — R29712 NIHSS score 12: Secondary | ICD-10-CM | POA: Diagnosis present

## 2022-03-01 DIAGNOSIS — F419 Anxiety disorder, unspecified: Secondary | ICD-10-CM | POA: Diagnosis present

## 2022-03-01 DIAGNOSIS — H9190 Unspecified hearing loss, unspecified ear: Secondary | ICD-10-CM | POA: Diagnosis present

## 2022-03-01 DIAGNOSIS — Z90722 Acquired absence of ovaries, bilateral: Secondary | ICD-10-CM

## 2022-03-01 DIAGNOSIS — Z923 Personal history of irradiation: Secondary | ICD-10-CM

## 2022-03-01 DIAGNOSIS — Z90710 Acquired absence of both cervix and uterus: Secondary | ICD-10-CM

## 2022-03-01 DIAGNOSIS — E1165 Type 2 diabetes mellitus with hyperglycemia: Secondary | ICD-10-CM | POA: Diagnosis present

## 2022-03-01 DIAGNOSIS — Z79899 Other long term (current) drug therapy: Secondary | ICD-10-CM

## 2022-03-01 DIAGNOSIS — Z800 Family history of malignant neoplasm of digestive organs: Secondary | ICD-10-CM

## 2022-03-01 DIAGNOSIS — Z8249 Family history of ischemic heart disease and other diseases of the circulatory system: Secondary | ICD-10-CM

## 2022-03-01 DIAGNOSIS — Z96652 Presence of left artificial knee joint: Secondary | ICD-10-CM | POA: Diagnosis present

## 2022-03-01 LAB — RESP PANEL BY RT-PCR (FLU A&B, COVID) ARPGX2
Influenza A by PCR: NEGATIVE
Influenza B by PCR: NEGATIVE
SARS Coronavirus 2 by RT PCR: NEGATIVE

## 2022-03-01 LAB — CBC WITH DIFFERENTIAL/PLATELET
Abs Immature Granulocytes: 0.02 10*3/uL (ref 0.00–0.07)
Basophils Absolute: 0.1 10*3/uL (ref 0.0–0.1)
Basophils Relative: 1 %
Eosinophils Absolute: 0.1 10*3/uL (ref 0.0–0.5)
Eosinophils Relative: 1 %
HCT: 33 % — ABNORMAL LOW (ref 36.0–46.0)
Hemoglobin: 10.1 g/dL — ABNORMAL LOW (ref 12.0–15.0)
Immature Granulocytes: 0 %
Lymphocytes Relative: 22 %
Lymphs Abs: 1.4 10*3/uL (ref 0.7–4.0)
MCH: 29.2 pg (ref 26.0–34.0)
MCHC: 30.6 g/dL (ref 30.0–36.0)
MCV: 95.4 fL (ref 80.0–100.0)
Monocytes Absolute: 0.8 10*3/uL (ref 0.1–1.0)
Monocytes Relative: 13 %
Neutro Abs: 3.8 10*3/uL (ref 1.7–7.7)
Neutrophils Relative %: 63 %
Platelets: 169 10*3/uL (ref 150–400)
RBC: 3.46 MIL/uL — ABNORMAL LOW (ref 3.87–5.11)
RDW: 16 % — ABNORMAL HIGH (ref 11.5–15.5)
WBC: 6.1 10*3/uL (ref 4.0–10.5)
nRBC: 0 % (ref 0.0–0.2)

## 2022-03-01 LAB — COMPREHENSIVE METABOLIC PANEL
ALT: 8 U/L (ref 0–44)
AST: 21 U/L (ref 15–41)
Albumin: 2.6 g/dL — ABNORMAL LOW (ref 3.5–5.0)
Alkaline Phosphatase: 54 U/L (ref 38–126)
Anion gap: 10 (ref 5–15)
BUN: 14 mg/dL (ref 8–23)
CO2: 23 mmol/L (ref 22–32)
Calcium: 9.2 mg/dL (ref 8.9–10.3)
Chloride: 100 mmol/L (ref 98–111)
Creatinine, Ser: 0.91 mg/dL (ref 0.44–1.00)
GFR, Estimated: >60 mL/min (ref >60)
Glucose, Bld: 130 mg/dL — ABNORMAL HIGH (ref 70–99)
Potassium: 4.4 mmol/L (ref 3.5–5.1)
Sodium: 133 mmol/L — ABNORMAL LOW (ref 135–145)
Total Bilirubin: 0.9 mg/dL (ref 0.3–1.2)
Total Protein: 5.5 g/dL — ABNORMAL LOW (ref 6.5–8.1)

## 2022-03-01 LAB — LIPASE, BLOOD: Lipase: 20 U/L (ref 11–51)

## 2022-03-01 LAB — LACTIC ACID, PLASMA: Lactic Acid, Venous: 1.6 mmol/L (ref 0.5–1.9)

## 2022-03-01 MED ORDER — ACETAMINOPHEN 650 MG RE SUPP
650.0000 mg | RECTAL | Status: DC | PRN
  Administered 2022-03-05: 650 mg via RECTAL
  Filled 2022-03-01: qty 1

## 2022-03-01 MED ORDER — ACETAMINOPHEN 160 MG/5ML PO SOLN: 650.0000 mg | ORAL | Status: DC | PRN

## 2022-03-01 MED ORDER — ASPIRIN 81 MG PO TBEC
81.0000 mg | DELAYED_RELEASE_TABLET | Freq: Every day | ORAL | Status: DC
  Administered 2022-03-01 – 2022-03-05 (×5): 81 mg via ORAL
  Filled 2022-03-01 (×5): qty 1

## 2022-03-01 MED ORDER — SODIUM CHLORIDE 0.9 % IV SOLN: INTRAVENOUS | Status: DC

## 2022-03-01 MED ORDER — IOHEXOL 350 MG/ML SOLN
75.0000 mL | Freq: Once | INTRAVENOUS | Status: AC | PRN
  Administered 2022-03-01: 75 mL via INTRAVENOUS

## 2022-03-01 MED ORDER — ACETAMINOPHEN 325 MG PO TABS: 650.0000 mg | ORAL_TABLET | ORAL | Status: DC | PRN

## 2022-03-01 MED ORDER — STROKE: EARLY STAGES OF RECOVERY BOOK
Freq: Once | Status: AC
  Filled 2022-03-01: qty 1

## 2022-03-01 MED ORDER — HYDRALAZINE HCL 20 MG/ML IJ SOLN: 10.0000 mg | INTRAMUSCULAR | Status: DC | PRN

## 2022-03-01 NOTE — ED Triage Notes (Signed)
Pt arrived from [facility] BIB by [facility]. Staff of facility reported the pt to have increase lethargy and AMS. Staff reported that the pt normally will have her eyes open spontaneously and respond to verbal stimuli. Staff reported that the pt refused to eat anything this morning, however she ate last night. Night shift staff report no signs of AMS. Initially the facility stated that her spO2 level was 62%. [facility] arrived at placed pt on 6L of O2 and spO2 read at 99%. Pt has had a PE in the past. Pt is in no respiratory distress upon arrival. Staff members stated that the pt's left arm is typically swollen, the cause in unknown.   LVS 130/90  HR 90 and irregular  Hx of diabetes  18RR  Cbg 174

## 2022-03-01 NOTE — ED Notes (Signed)
Pt to CT at this time.

## 2022-03-01 NOTE — ED Notes (Signed)
Patient transported to MRI 

## 2022-03-01 NOTE — H&P (Signed)
History and Physical    Patient: Cassandra Dodson DJS:[id number] DOB: 12/01/1940 DOA: 03/01/2022 DOS: the patient was seen and examined on 03/01/2022 PCP: Shon Baton, MD  Patient coming from: [facility] via EMS  Chief Complaint:  Chief Complaint  Patient presents with   Altered Mental Status   HPI: ASHMI BLAS is a 81 y.o. 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  Normally patient would open her eyes and respond to verbal stimuli, but had been refusing to eat anything this morning.  Patient had been hospitalized in September of this year for altered mental status thought secondary to UTI, meningioma with vasogenic edema, and possible acute CVA.  Subsequently admitted in October requiring intubation due to hypoxia for saddle pulmonary embolus and underwent mechanical thrombectomy and thrombolysis with interventional radiology.  She had required 2 units of packed red blood cells due to drop in hemoglobin but had remained stable thereafter and had been transition from IV heparin to Eliquis.   Facility stated O2 saturations as low as 62% on on room air.  EMS arrival patient was placed on 6 L of oxygen with O2 saturation 99%.  Upon admission into the emergency department patient was seen as noted to have temperature of 99.5 F with tachypnea and all other vital signs relatively maintained.  She labs noted hemoglobin 10.1 , sodium 133, and glucose 130.  Influenza and COVID-19 screening was negative.  Blood cultures have been obtained.  CT scan of the head noted acute/early subacute cortical and subcortical right PCA territory infarct with right temporal and occipital lobe  petechial hemorrhage within the infarction territory with local mass effect with partial effacement of the right lateral ventricle, a 1.6 cm x 0.6 cm infarct within the left cerebellar hemisphere of indeterminate age, and unchanged large meningioma with regional mass effect and moderate vasogenic edema within the left frontal lobe with 1.5 cm rightward shift.   Review of Systems: As mentioned in the history of present illness. All other systems reviewed and are negative. Past Medical History:  Diagnosis Date   Arthritis    CKD (chronic kidney disease), stage III ([place])    patient denies   Depression    Diabetes mellitus without complication ([place])    Difficult intravenous access    Endometrial cancer ([place])    GERD (gastroesophageal reflux disease)    Headache    History of radiation therapy 11/04/2019-12/01/2019   Endometrial HDR; Dr. Gery Pray   Hypertension    Hypothyroidism    Neuromuscular disorder ([place])    neuropathy in feet   Osteoarthritis    PMB (postmenopausal bleeding)    PONV (postoperative nausea and vomiting)    severe nausea and vomiting after knee replacement 06-2014, did ok with 2018 knee replacement   Urinary frequency    Wears glasses    Past Surgical History:  Procedure Laterality Date   BREAST SURGERY     cyst removed   CESAREAN SECTION     DILATION AND CURETTAGE OF UTERUS N/A 06/24/2019   Procedure: DILATATION AND CURETTAGE;  Surgeon: Lafonda Mosses, MD;  Location: [place];  Service: Gynecology;  Laterality: N/A;   FRACTURE SURGERY     right knee   INTRAUTERINE DEVICE (IUD) INSERTION N/A  06/24/2019   Procedure: INTRAUTERINE DEVICE (IUD) INSERTION MIRENA;  Surgeon: Lafonda Mosses, MD;  Location: [facility];  Service: Gynecology;  Laterality: N/A;   IR ANGIO INTRA EXTRACRAN SEL COM CAROTID INNOMINATE UNI R MOD SED  11/05/2021   IR ANGIO VERTEBRAL SEL VERTEBRAL UNI R MOD SED  11/05/2021   IR ANGIOGRAM PULMONARY  BILATERAL SELECTIVE  01/28/2022   IR ANGIOGRAM SELECTIVE EACH ADDITIONAL VESSEL  01/28/2022   IR ANGIOGRAM SELECTIVE EACH ADDITIONAL VESSEL  01/28/2022   IR CT HEAD LTD  11/01/2021   IR INTRA CRAN STENT  11/01/2021   IR RADIOLOGIST EVAL & MGMT  12/19/2021   IR THROMBECT PRIM MECH INIT (INCLU) MOD SED  01/28/2022   IR THROMBECT PRIM MECH INIT (INCLU) MOD SED  01/28/2022   IR US GUIDE VASC ACCESS RIGHT  11/01/2021   IR US GUIDE VASC ACCESS RIGHT  01/28/2022   IRRIGATION AND DEBRIDEMENT SEBACEOUS CYST     JOINT REPLACEMENT Right    right   LYMPH NODE DISSECTION N/A 09/14/2019   Procedure: LYMPH NODE DISSECTION;  Surgeon: Lafonda Mosses, MD;  Location: [facility];  Service: Gynecology;  Laterality: N/A;   RADIOLOGY WITH ANESTHESIA N/A 11/01/2021   Procedure: ANGIOPLASTY/STENT;  Surgeon: Luanne Bras, MD;  Location: [place];  Service: Radiology;  Laterality: N/A;   RADIOLOGY WITH ANESTHESIA N/A 01/28/2022   Procedure: IR WITH ANESTHESIA;  Surgeon: Radiologist, Medication, MD;  Location: [place];  Service: Radiology;  Laterality: N/A;   ROBOTIC ASSISTED TOTAL HYSTERECTOMY WITH BILATERAL SALPINGO OOPHERECTOMY Bilateral 09/14/2019   Procedure: XI ROBOTIC ASSISTED TOTAL HYSTERECTOMY WITH BILATERAL SALPINGO OOPHORECTOMY;  Surgeon: Lafonda Mosses, MD;  Location: [facility];  Service: Gynecology;  Laterality: Bilateral;   SENTINEL NODE BIOPSY N/A 09/14/2019   Procedure: SENTINEL NODE BIOPSY;  Surgeon: Lafonda Mosses, MD;  Location: [facility];  Service: Gynecology;  Laterality: N/A;   teeth extration     TONSILLECTOMY     TOTAL HIP ARTHROPLASTY Right 02/23/2015   Procedure: RIGHT TOTAL HIP ARTHROPLASTY ANTERIOR APPROACH;  Surgeon: Leandrew Koyanagi, MD;  Location: [place];  Service: Orthopedics;  Laterality: Right;   TOTAL KNEE ARTHROPLASTY Left 06/27/2016   Procedure: LEFT TOTAL KNEE ARTHROPLASTY;  Surgeon: Leandrew Koyanagi, MD;  Location: [place];  Service: Orthopedics;  Laterality: Left;   Social History:  reports that  she quit smoking about 19 years ago. Her smoking use included cigarettes. She has a 67.50 pack-year smoking history. She has never used smokeless tobacco. She reports current alcohol use. She reports that she does not use drugs.  Allergies  Allergen Reactions   Anesthetics, Amide Other (See Comments)    Pt is intolerant to general anesthesia. Pt will throw up and has thrown up during procedure.    Ether Nausea And Vomiting    Family History  Problem Relation Age of Onset   Pancreatic cancer Mother    Stroke Father    Hypertension Father    Colon cancer Neg Hx    Breast cancer Neg Hx    Ovarian cancer Neg Hx    Endometrial cancer Neg Hx    Prostate cancer Neg Hx     Prior to Admission medications   Medication Sig Start Date End Date Taking? Authorizing Provider  amLODipine (NORVASC) 5 MG tablet Take 1 tablet (5 mg total) by mouth daily. 01/18/22 02/17/22  Darliss Cheney, MD  apixaban (ELIQUIS) 5 MG TABS tablet Take 1 tablet (5 mg total) by mouth 2 (two)  times daily. 02/16/22   Lorella Nimrod, MD  calcium carbonate (OS-CAL - DOSED IN MG OF ELEMENTAL CALCIUM) 1250 (500 Ca) MG tablet Take 1 tablet (1,250 mg total) by mouth daily with breakfast. 02/16/22   Lorella Nimrod, MD  docusate sodium (COLACE) 100 MG capsule Take 1 capsule (100 mg total) by mouth 2 (two) times daily. 02/16/22   Lorella Nimrod, MD  fenofibrate 160 MG tablet Take 1 tablet (160 mg total) by mouth daily after breakfast. 11/27/21   Love, Ivan Anchors, PA-C  folic acid (FOLVITE) 1 MG tablet Take 1 tablet (1 mg total) by mouth daily. 11/27/21   Love, Ivan Anchors, PA-C  insulin glargine (LANTUS) 100 UNIT/ML Solostar Pen Inject 15 Units into the skin daily. 02/16/22   Lorella Nimrod, MD  insulin lispro (HUMALOG) 100 UNIT/ML KwikPen Inject 3 Units into the skin 3 (three) times daily with meals. 02/16/22   Lorella Nimrod, MD  Insulin Pen Needle 32G X 4 MM MISC Use daily in the morning, at noon, and at bedtime. 11/27/21   Love, Ivan Anchors, PA-C   methylphenidate (RITALIN) 5 MG tablet Take 1 tablet (5 mg total) by mouth 2 (two) times daily with breakfast and lunch. 11/27/21   Love, Ivan Anchors, PA-C  metoprolol tartrate (LOPRESSOR) 25 MG tablet Take 1 tablet (25 mg total) by mouth 2 (two) times daily. 02/16/22   Lorella Nimrod, MD  Mouthwashes (MOUTH RINSE) LIQD solution 15 mLs by Mouth Rinse route as needed (for oral care). 11/05/21   Raiford Noble Latif, DO  pantoprazole (PROTONIX) 40 MG tablet Take 1 tablet (40 mg total) by mouth daily. 11/27/21   Love, Ivan Anchors, PA-C  rosuvastatin (CRESTOR) 20 MG tablet Take 1 tablet (20 mg total) by mouth daily. 11/27/21   Love, Ivan Anchors, PA-C  senna (SENOKOT) 8.6 MG TABS tablet Take 1.5 tablets (12.9 mg total) by mouth at bedtime as needed. 02/16/22   Lorella Nimrod, MD  Zinc Oxide (DESITIN) 13 % CREA Apply 1 application  topically 2 (two) times daily. To buttocks for protection. 11/27/21   Bary Leriche, PA-C    Physical Exam: Vitals:   03/01/22 1035 03/01/22 1040 03/01/22 1042 03/01/22 1045  BP:  (!) 149/67  (!) 145/61  Pulse: 89   89  Resp: '18 19  17  '$ Temp:   99.5 F (37.5 C)   TempSrc:   Rectal   SpO2: 100%   100%   Constitutional: Elderly female currently in no acute distress Eyes: lids and conjunctivae normal ENMT: Mucous membranes are moist.  Hard of hearing.  Neck: normal, supple, no masses, no thyromegaly Respiratory: clear to auscultation bilaterally, no wheezing, no crackles. Normal respiratory effort. No accessory muscle use.  Cardiovascular: Regular rate and rhythm, no murmurs / rubs / gallops. Abdomen: no tenderness, no masses palpated. No hepatosplenomegaly. Bowel sounds positive.  Musculoskeletal: no clubbing / cyanosis.  Swelling of the left upper extremity. Skin: no rashes, lesions, ulcers. No induration Neurologic: CN 2-12 grossly intact.  Left facial droop present.  Strength 2/5 in the left upper and lower extremity extremity able to move her fingers and toes.  5/5 in the right upper  extremity, but noted to be 2/5 in the right lower extremity. Psychiatric: Patient is alert, but oriented only to self not to place or time.  Data Reviewed:  EKG reveals sinus rhythm at 98 bpm with PVCs.  Reviewed labs, imaging, and pertinent records as noted above in the HPI  Assessment and Plan: CVA history of  CVA with residual deficit Subacute.  Patient presented after being noted to be acutely altered.  At baseline has left-sided weakness from prior CVA found to have concern for acute/early subacute cortical and subcortical right PCA territory infarct with right temporal and occipital lobe petechial hemorrhage within the infarction territory with local mass effect with partial effacement of the right lateral ventricle, a 1.6 cm x 0.6 cm infarct within the left cerebellar hemisphere of indeterminate age, and unchanged large meningioma with regional mass effect and moderate vasogenic edema within the left frontal lobe with 1.5 cm rightward shift on CT.  Patient with history of recurrent strokes -Admit to a cardiac telemetry bed -Neurochecks -Follow-up MRI of the brain and CTA of the head and neck -Check lipid panel and hemoglobin A1c in a.m. -Continue aspirin for now as patient noted to have petechial hemorrhage -PT/OT/speech to eval and treat -Aspirin 81 mg daily -Hydralazine to maintain systolic blood pressure less than 180 -Appreciate neurology consultative services, we will follow-up for any further recommendations  History of PE on chronic anticoagulation During hospitalization in October patient required intubation after being found to be hypoxic with saddle PE.  Patient had been on Eliquis -Holding Eliquis due to acute stroke -Question if patient needs to be evaluated for need of IVC filter  Normocytic anemia Chronic.  Hemoglobin 10.1 g/dL which appears improved from prior. 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 -Continue outpatient follow-up with neurosurgery  Endometrial cancer -Continue outpatient follow-up with gynecologic oncology   Advance Care Planning:   Code Status: Full Code    Consults: Neurology  Severity of Illness: The appropriate patient status for this patient is INPATIENT. Inpatient status is judged to be reasonable and necessary in order to provide the required intensity of service to ensure the patient's safety. The patient's presenting symptoms, physical exam findings, and initial radiographic and laboratory data in the context of their chronic comorbidities is felt to place them at high risk for further clinical deterioration. Furthermore, it is not anticipated that the patient will be medically stable for discharge from the hospital within 2 midnights of admission.   * I certify that at the point of admission it is my clinical judgment that the patient will require inpatient hospital care spanning beyond 2 midnights from the point of admission due to high intensity of service, high risk for further deterioration and high frequency of surveillance required.*  Author: Norval Morton, MD 03/01/2022 12:29 PM  For on call review [email or web address].

## 2022-03-01 NOTE — Consult Note (Signed)
Neurology Consultation  Reason for Consult: Right PCA stroke Referring Physician: Dr. Tamala Julian  CC: None  History is obtained from:chart  HPI: Cassandra Dodson is a 81 y.o. female with history of meningioma, pulmonary embolism, CKD, stroke, basilar artery stent and diabetes who presents from skilled nursing facility with lethargy and altered mental status.  Patient is oriented to person only and states that nothing unusual is going on with her today.  She has a large frontal meningioma and is being followed outpatient by neurosurgery.  She had a recent stroke in July, and was admitted to the hospital in October for a pulmonary embolus.  CT of the head reveals an acute to subacute right PCA stroke with petechial hemorrhage.  Last known well time is unclear due to patient's altered mental status.   LKW: Unclear TNK given?: no, outside of window IR Thrombectomy? No, outside of window Modified Rankin Scale: 4-Needs assistance to walk and tend to bodily needs  ROS:  Unable to obtain due to altered mental status.   Past Medical History:  Diagnosis Date   Arthritis    CKD (chronic kidney disease), stage III ([place])    patient denies   Depression    Diabetes mellitus without complication ([place])    Difficult intravenous access    Endometrial cancer ([place])    GERD (gastroesophageal reflux disease)    Headache    History of radiation therapy 11/04/2019-12/01/2019   Endometrial HDR; Dr. Gery Pray   Hypertension    Hypothyroidism    Neuromuscular disorder ([place])    neuropathy in feet   Osteoarthritis    PMB (postmenopausal bleeding)    PONV (postoperative nausea and vomiting)    severe nausea and vomiting after knee replacement 06-2014, did ok with 2018 knee replacement   Urinary frequency    Wears glasses      Family History  Problem Relation Age of Onset   Pancreatic cancer Mother    Stroke Father    Hypertension Father    Colon cancer Neg Hx    Breast cancer Neg Hx    Ovarian cancer  Neg Hx    Endometrial cancer Neg Hx    Prostate cancer Neg Hx      Social History:   reports that she quit smoking about 19 years ago. Her smoking use included cigarettes. She has a 67.50 pack-year smoking history. She has never used smokeless tobacco. She reports current alcohol use. She reports that she does not use drugs.  Medications  Current Facility-Administered Medications:    [START ON 03/02/2022]  stroke: early stages of recovery book, , Does not apply, Once, Smith, Rondell A, MD   0.9 %  sodium chloride infusion, , Intravenous, Continuous, Smith, Rondell A, MD   acetaminophen (TYLENOL) tablet 650 mg, 650 mg, Oral, Q4H PRN **OR** acetaminophen (TYLENOL) 160 MG/5ML solution 650 mg, 650 mg, Per Tube, Q4H PRN **OR** acetaminophen (TYLENOL) suppository 650 mg, 650 mg, Rectal, Q4H PRN, Fuller Plan A, MD  Current Outpatient Medications:    amLODipine (NORVASC) 5 MG tablet, Take 1 tablet (5 mg total) by mouth daily., Disp: 30 tablet, Rfl: 0   antiseptic oral rinse (BIOTENE) LIQD, 15 mLs by Mouth Rinse route daily as needed for dry mouth., Disp: , Rfl:    apixaban (ELIQUIS) 5 MG TABS tablet, Take 1 tablet (5 mg total) by mouth 2 (two) times daily., Disp: 60 tablet, Rfl: 3   bisacodyl (DULCOLAX) 10 MG suppository, Place 10 mg rectally daily as needed (constipation not  relieved by [place])., Disp: , Rfl:    calcium carbonate (TUMS - DOSED IN MG ELEMENTAL CALCIUM) 500 MG chewable tablet, Chew 500 mg by mouth daily., Disp: , Rfl:    docusate sodium (COLACE) 100 MG capsule, Take 1 capsule (100 mg total) by mouth 2 (two) times daily., Disp: 60 capsule, Rfl: 0   fenofibrate 160 MG tablet, Take 1 tablet (160 mg total) by mouth daily after breakfast. (Patient taking differently: Take 160 mg by mouth daily.), Disp: 30 tablet, Rfl: 0   folic acid (FOLVITE) 1 MG tablet, Take 1 tablet (1 mg total) by mouth daily., Disp: 30 tablet, Rfl: 0   insulin glargine (LANTUS) 100 UNIT/ML Solostar Pen,  Inject 15 Units into the skin daily. (Patient taking differently: Inject 15 Units into the skin at bedtime.), Disp: 15 mL, Rfl: 0   insulin lispro (HUMALOG) 100 UNIT/ML KwikPen, Inject 3 Units into the skin 3 (three) times daily with meals. (Patient taking differently: Inject 3 Units into the skin 3 (three) times daily.), Disp: 15 mL, Rfl: 11   Magnesium Hydroxide (MILK OF MAGNESIA PO), Take 30 mLs by mouth daily as needed (constipation)., Disp: , Rfl:    methylphenidate (RITALIN) 5 MG tablet, Take 1 tablet (5 mg total) by mouth 2 (two) times daily with breakfast and lunch. (Patient taking differently: Take 5 mg by mouth 2 (two) times daily.), Disp: 60 tablet, Rfl: 0   metoprolol tartrate (LOPRESSOR) 25 MG tablet, Take 1 tablet (25 mg total) by mouth 2 (two) times daily., Disp: 60 tablet, Rfl: 3   Nutritional Supplements (NUTRITIONAL DRINK MIX PO), Take 1 Dose by mouth 3 (three) times daily. Magic Cup, Disp: , Rfl:    pantoprazole (PROTONIX) 40 MG tablet, Take 1 tablet (40 mg total) by mouth daily. (Patient taking differently: Take 40 mg by mouth in the morning.), Disp: 30 tablet, Rfl: 0   rosuvastatin (CRESTOR) 20 MG tablet, Take 1 tablet (20 mg total) by mouth daily. (Patient taking differently: Take 20 mg by mouth at bedtime.), Disp: 30 tablet, Rfl: 0   senna (SENOKOT) 8.6 MG TABS tablet, Take 1.5 tablets (12.9 mg total) by mouth at bedtime as needed. (Patient taking differently: Take 12.9 mg by mouth at bedtime as needed (constipation).), Disp: 120 tablet, Rfl: 0   Sodium Phosphates (RA SALINE ENEMA RE), Place 1 enema rectally daily as needed (constipation not relieved by bisacodyl suppository)., Disp: , Rfl:    Zinc Oxide (DESITIN) 13 % CREA, Apply 1 application  topically 2 (two) times daily. To buttocks for protection. (Patient taking differently: Apply 1 application  topically 2 (two) times daily. To buttocks), Disp: 113 g, Rfl: 3   Insulin Pen Needle 32G X 4 MM MISC, Use daily in the morning, at  noon, and at bedtime., Disp: 100 each, Rfl: 1   Exam: Current vital signs: BP (!) 145/61 (BP Location: Right Arm)   Pulse 89   Temp 99.5 F (37.5 C) (Rectal)   Resp 17   SpO2 100%  Vital signs in last 24 hours: Temp:  [99.5 F (37.5 C)] 99.5 F (37.5 C) (11/10 1042) Pulse Rate:  [87-89] 89 (11/10 1045) Resp:  [17-28] 17 (11/10 1045) BP: (145-149)/(61-67) 145/61 (11/10 1045) SpO2:  [99 %-100 %] 100 % (11/10 1045)  GENERAL: Awake, alert, in no acute distress Psych: Affect appropriate for situation, patient is calm and mostly cooperative with examination Head: Normocephalic and atraumatic, without obvious abnormality EENT: Normal conjunctivae, moist mucous membranes, no OP obstruction  LUNGS: Normal respiratory effort. Non-labored breathing on room air CV: Regular rate and rhythm on telemetry Extremities: warm, well perfused, without obvious deformity  NEURO:  Mental Status: Awake, alert, and oriented to person only, states she is in a [place] stadium.  Able to follow one-step but not two-step commands She is not able to provide a clear and coherent history of present illness. Speech/Language: speech is clear and fluent.   No neglect is noted Cranial Nerves:  II: PERRL visual fields full.  III, IV, VI: EOMI. Lid elevation symmetric and full.  V: Sensation is intact to light touch and symmetrical to face.  VII: Slight left facial droop VIII: Hearing intact to voice IX, X: Phonation normal.  XI: Normal sternocleidomastoid and trapezius muscle strength XII: Tongue protrudes midline without fasciculations.   Motor: 5/5 strength to right upper extremity, 2/5 strength in bilateral lower extremities, 2/5 strength in left upper extremity Tone is normal on right, left arm with decreased tone. Bulk is normal.  Sensation: Intact to light touch bilaterally in all four extremities.  Unable to assess for extinction due to altered mental status Coordination: Unable to assess due to altered  mental status DTRs: Unable to elicit Gait: Deferred  NIHSS: 1a Level of Conscious.: 0 1b LOC Questions: 2 1c LOC Commands: 0 2 Best Gaze: 0 3 Visual: 0 4 Facial Palsy: 1 5a Motor Arm - left: 3 5b Motor Arm - Right: 0 6a Motor Leg - Left: 3 6b Motor Leg - Right: 3 7 Limb Ataxia: 0 8 Sensory: 0 9 Best Language: 0 10 Dysarthria: 0 11 Extinct. and Inatten.: 0 TOTAL: 12   Labs I have reviewed labs in epic and the results pertinent to this consultation are:  CBC    Component Value Date/Time   WBC 6.1 03/01/2022 1024   RBC 3.46 (L) 03/01/2022 1024   HGB 10.1 (L) 03/01/2022 1024   HCT 33.0 (L) 03/01/2022 1024   PLT 169 03/01/2022 1024   MCV 95.4 03/01/2022 1024   MCH 29.2 03/01/2022 1024   MCHC 30.6 03/01/2022 1024   RDW 16.0 (H) 03/01/2022 1024   LYMPHSABS 1.4 03/01/2022 1024   MONOABS 0.8 03/01/2022 1024   EOSABS 0.1 03/01/2022 1024   BASOSABS 0.1 03/01/2022 1024    CMP     Component Value Date/Time   NA 133 (L) 03/01/2022 1024   NA 134 (A) 07/09/2016 0000   K 4.4 03/01/2022 1024   CL 100 03/01/2022 1024   CO2 23 03/01/2022 1024   GLUCOSE 130 (H) 03/01/2022 1024   BUN 14 03/01/2022 1024   BUN 41 (A) 07/09/2016 0000   CREATININE 0.91 03/01/2022 1024   CALCIUM 9.2 03/01/2022 1024   PROT 5.5 (L) 03/01/2022 1024   ALBUMIN 2.6 (L) 03/01/2022 1024   AST 21 03/01/2022 1024   ALT 8 03/01/2022 1024   ALKPHOS 54 03/01/2022 1024   BILITOT 0.9 03/01/2022 1024   GFRNONAA >60 03/01/2022 1024   GFRAA 44 (L) 09/03/2019 1527    Lipid Panel     Component Value Date/Time   CHOL 118 01/16/2022 0852   TRIG 92 01/27/2022 0904   HDL 48 01/16/2022 0852   CHOLHDL 2.5 01/16/2022 0852   VLDL 26 01/16/2022 0852   LDLCALC 44 01/16/2022 0852     Imaging I have reviewed the images obtained:  CT-scan of the brain: Acute or early subacute infarct in right PCA territory with petechial hemorrhage.  Small infarct within left cerebellar hemisphere, age indeterminant, atrophy  and  chronic small vessel disease, large frontal meningioma  MRI examination of the brain: Pending  Assessment: 81 year old patient with history of meningioma, stroke, basilar stent, CKD, diabetes and pulmonary embolism presents with altered mental status and lethargy from skilled nursing facility.  She was found to have a new right PCA stroke with petechial hemorrhage on head CT.  Patient is oriented to person only and unable to give any history or describe symptoms.  Patient had a stroke in July as well as a stroke in September of this year.  She was also admitted to the hospital in October with a large pulmonary embolus.  CT angiogram in September did demonstrate severe stenosis of right PCA.  Patient will need admission for stroke work-up and rehabilitation.  Impression: Acute or subacute right PCA stroke in patient with history of stroke and basilar stent, likely caused by PCA stenosis  Recommendations: Stroke/TIA Workup  - Admit for stroke workup - Goal BP <180 in the setting of petechial hemorrhage in the noted R PCA stroke. PRN labetalol or hydralazine if BP above these parameters. - MRI brain wo contrast - CTA head and neck - Check A1c and LDL + add statin per guidelines - aspirin '81mg'$  daily for now given petechial hemorrhage but also known hx of significant ICAD, patient was fully anticoagulated with Eliquis prior to admission - q4 hr neuro checks - STAT head CT for any change in neuro exam - Tele - PT/OT/SLP - Stroke education - Amb referral to neurology upon discharge    Pt seen by NP/Neuro and later by MD. Note/plan to be edited by MD as needed.  [place] , MSN, AGACNP-BC [facility] See Amion for schedule and pager information 03/01/2022 2:02 PM   NEUROHOSPITALIST ADDENDUM Performed a face to face diagnostic evaluation.   I have reviewed the contents of history and physical exam as documented by PA/ARNP/Resident and agree with above  documentation.  I have discussed and formulated the above plan as documented. Edits to the note have been made as needed.  Impression/Key exam findings/Plan: 44F with known meningioma p/w lethargy and AMS. Found to have a moderately large R PCA stroke with petechial hemorrhage but no frank transformation. Prior vessel imaging with significnat ICAD despite antiplatelet. Suspect that she either had progression of the earlier noted R PCA moderate to severe stenosis or had an embolic stroke.  Will do aspirin '81mg'$  daily alone, keep SBP < 180 and full stroke workup.  Exam limited by poor cooperation by the patient. Noted left sided weakness, no obvious vision deficit noted thou.  Donnetta Simpers, MD [facility] [phone]   If 7pm to 7am, please call on call as listed on AMION.

## 2022-03-01 NOTE — ED Provider Notes (Signed)
[facility] EMERGENCY DEPARTMENT Provider Note   CSN: [id number] Arrival date & time: 03/01/22  1000     History  Chief Complaint  Patient presents with   Altered Mental Status    Cassandra Dodson is a 81 y.o. female.  Level 5 caveat due to altered mental status.  Patient brought here due to increased lethargy and altered mental status.  Patient also here with her son.  Staff states that she is not eat or drink anything this morning.  Family states that she seems less awake and confused.  Recently hospitalized for blood clot.  History of UTIs and stroke.  Gets confused easily if she is sick.  She has left-sided symptoms from stroke.  She had normal blood sugar with EMS.  History of meningioma, CKD, neuromuscular disorder, diabetes.  Patient on Eliquis due to recent blood clot.  Not sure if she has fever.  Overall history is limited.  The history is provided by the EMS personnel, a caregiver and the patient.       Home Medications Prior to Admission medications   Medication Sig Start Date End Date Taking? Authorizing Provider  amLODipine (NORVASC) 5 MG tablet Take 1 tablet (5 mg total) by mouth daily. 01/18/22 02/17/22  Darliss Cheney, MD  apixaban (ELIQUIS) 5 MG TABS tablet Take 1 tablet (5 mg total) by mouth 2 (two) times daily. 02/16/22   Lorella Nimrod, MD  calcium carbonate (OS-CAL - DOSED IN MG OF ELEMENTAL CALCIUM) 1250 (500 Ca) MG tablet Take 1 tablet (1,250 mg total) by mouth daily with breakfast. 02/16/22   Lorella Nimrod, MD  docusate sodium (COLACE) 100 MG capsule Take 1 capsule (100 mg total) by mouth 2 (two) times daily. 02/16/22   Lorella Nimrod, MD  fenofibrate 160 MG tablet Take 1 tablet (160 mg total) by mouth daily after breakfast. 11/27/21   Love, Ivan Anchors, PA-C  folic acid (FOLVITE) 1 MG tablet Take 1 tablet (1 mg total) by mouth daily. 11/27/21   Love, Ivan Anchors, PA-C  insulin glargine (LANTUS) 100 UNIT/ML Solostar Pen Inject 15 Units into the skin daily.  02/16/22   Lorella Nimrod, MD  insulin lispro (HUMALOG) 100 UNIT/ML KwikPen Inject 3 Units into the skin 3 (three) times daily with meals. 02/16/22   Lorella Nimrod, MD  Insulin Pen Needle 32G X 4 MM MISC Use daily in the morning, at noon, and at bedtime. 11/27/21   Love, Ivan Anchors, PA-C  methylphenidate (RITALIN) 5 MG tablet Take 1 tablet (5 mg total) by mouth 2 (two) times daily with breakfast and lunch. 11/27/21   Love, Ivan Anchors, PA-C  metoprolol tartrate (LOPRESSOR) 25 MG tablet Take 1 tablet (25 mg total) by mouth 2 (two) times daily. 02/16/22   Lorella Nimrod, MD  Mouthwashes (MOUTH RINSE) LIQD solution 15 mLs by Mouth Rinse route as needed (for oral care). 11/05/21   Raiford Noble Latif, DO  pantoprazole (PROTONIX) 40 MG tablet Take 1 tablet (40 mg total) by mouth daily. 11/27/21   Love, Ivan Anchors, PA-C  rosuvastatin (CRESTOR) 20 MG tablet Take 1 tablet (20 mg total) by mouth daily. 11/27/21   Love, Ivan Anchors, PA-C  senna (SENOKOT) 8.6 MG TABS tablet Take 1.5 tablets (12.9 mg total) by mouth at bedtime as needed. 02/16/22   Lorella Nimrod, MD  Zinc Oxide (DESITIN) 13 % CREA Apply 1 application  topically 2 (two) times daily. To buttocks for protection. 11/27/21   Bary Leriche, PA-C  Allergies    Anesthetics, amide and Ether    Review of Systems   Review of Systems  Physical Exam Updated Vital Signs BP (!) 145/61 (BP Location: Right Arm)   Pulse 89   Temp 99.5 F (37.5 C) (Rectal)   Resp 17   SpO2 100%  Physical Exam Vitals and nursing note reviewed.  Constitutional:      General: She is not in acute distress.    Appearance: She is well-developed. She is ill-appearing.  HENT:     Head: Normocephalic and atraumatic.     Nose: Nose normal.     Mouth/Throat:     Mouth: Mucous membranes are dry.  Eyes:     Extraocular Movements: Extraocular movements intact.     Conjunctiva/sclera: Conjunctivae normal.     Pupils: Pupils are equal, round, and reactive to light.  Cardiovascular:      Rate and Rhythm: Normal rate and regular rhythm.     Pulses: Normal pulses.     Heart sounds: Normal heart sounds. No murmur heard. Pulmonary:     Effort: Pulmonary effort is normal. No respiratory distress.     Breath sounds: Normal breath sounds.  Abdominal:     Palpations: Abdomen is soft.     Tenderness: There is no abdominal tenderness.  Musculoskeletal:        General: No swelling.     Cervical back: Neck supple.  Skin:    General: Skin is warm and dry.     Capillary Refill: Capillary refill takes less than 2 seconds.  Neurological:     Mental Status: She is alert.     Comments: Patient opens eyes spontaneously, moves all extremities with some diminished movement of the left arm which seems baseline     ED Results / Procedures / Treatments   Labs (all labs ordered are listed, but only abnormal results are displayed) Labs Reviewed  CBC WITH DIFFERENTIAL/PLATELET - Abnormal; Notable for the following components:      Result Value   RBC 3.46 (*)    Hemoglobin 10.1 (*)    HCT 33.0 (*)    RDW 16.0 (*)    All other components within normal limits  COMPREHENSIVE METABOLIC PANEL - Abnormal; Notable for the following components:   Sodium 133 (*)    Glucose, Bld 130 (*)    Total Protein 5.5 (*)    Albumin 2.6 (*)    All other components within normal limits  RESP PANEL BY RT-PCR (FLU A&B, COVID) ARPGX2  URINE CULTURE  CULTURE, BLOOD (ROUTINE X 2)  CULTURE, BLOOD (ROUTINE X 2)  LIPASE, BLOOD  LACTIC ACID, PLASMA  URINALYSIS, ROUTINE W REFLEX MICROSCOPIC  LACTIC ACID, PLASMA    EKG EKG Interpretation  Date/Time:  Friday March 01 2022 10:34:47 EST Ventricular Rate:  98 PR Interval:  176 QRS Duration: 94 QT Interval:  341 QTC Calculation: 422 R Axis:   65 Text Interpretation: Sinus rhythm Multiple premature complexes, vent & supraven Confirmed by Lennice Sites ([id number]) on 03/01/2022 10:39:42 AM  Radiology DG Chest Portable 1 View  Result Date:  03/01/2022 CLINICAL DATA:  Altered mental status. EXAM: PORTABLE CHEST 1 VIEW COMPARISON:  January 30, 2022. FINDINGS: The heart size and mediastinal contours are within normal limits. Both lungs are clear. The visualized skeletal structures are unremarkable. IMPRESSION: No active disease. Electronically Signed   By: Marijo Conception M.D.   On: 03/01/2022 11:32   CT Head Wo Contrast  Result Date: 03/01/2022 CLINICAL DATA:  Provided history: Mental status change, unknown cause. Increased lethargy. Altered mental status. EXAM: CT HEAD WITHOUT CONTRAST TECHNIQUE: Contiguous axial images were obtained from the base of the skull through the vertex without intravenous contrast. RADIATION DOSE REDUCTION: This exam was performed according to the departmental dose-optimization program which includes automated exposure control, adjustment of the mA and/or kV according to patient size and/or use of iterative reconstruction technique. COMPARISON:  Prior head CT examinations 01/26/2022 and earlier. Brain MRI 01/14/2019 FINDINGS: Brain: Mild generalized cerebral atrophy. Acute/early subacute appearing cortical and subcortical right PCA territory infarct within portions of the right temporal and occipital lobes, measuring 6.5 x 3.3 cm in transaxial dimensions. Curvilinear foci of hyperdensity within the infarction territory compatible with petechial hemorrhage. Local mass effect with partial effacement of the atrium, occipital horn and temporal horn of the right lateral ventricle. 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 Unchanged from the prior head CT of 01/26/2022, this measures 5.0 x 3.8 cm in transaxial dimensions. Also unchanged, there is regional mass effect, moderate vasogenic edema within the underlying anterior left frontal lobe and 1.5 cm rightward midline shift at the level of the anterior falx. Background moderate chronic small vessel ischemic changes within the cerebral white matter. Chronic  small vessel ischemic changes are also present within the pons, bilateral middle cerebellar peduncles and bilateral cerebellar hemispheres. Redemonstrated chronic infarcts within the right corona radiata/caudate nucleus, left corona radiata, left thalamus, within the right aspect of the pons and within the right cerebellar hemisphere. 1.6 x 0.6 cm infarct within the left cerebellar hemisphere, not definitively present on prior examinations and age-indeterminate. No extra-axial fluid collection. Vascular: No hyperdense vessel. Basilar artery stent. Atherosclerotic calcifications. Skull: No fracture or aggressive osseous lesion. Sinuses/Orbits: No mass or acute finding within the imaged orbits. Small-volume frothy secretions within a posterior right ethmoid air cell. Small-volume fluid within a posterior left ethmoid air cell. Impression #1 called by telephone at the time of interpretation on 03/01/2022 at 11:29 am to provider Shajuana Mclucas , who verbally acknowledged these results. IMPRESSION: Acute/early subacute appearing cortical and subcortical right PCA territory infarct within the right temporal and occipital lobes. Petechial hemorrhage within the infarction territory. Local mass effect with partial effacement the right lateral ventricle. 1.6 cm infarct within the left cerebellar hemisphere, not definitively present on prior exams and age-indeterminate, possibly acute/subacute. Background cerebral atrophy, chronic small vessel ischemic disease and chronic infarcts as described. 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 Mild bilateral ethmoid sinusitis. Electronically Signed   By: Kellie Simmering D.O.   On: 03/01/2022 11:31    Procedures Procedures    Medications Ordered in ED Medications - No data to display  ED Course/ Medical  Decision Making/ A&P                           Medical Decision Making Amount and/or Complexity of Data Reviewed Labs: ordered. Radiology: ordered.  Risk Decision regarding hospitalization.   MALASIA TORAIN is here for altered mental status.  Arrives from nursing home.  Recent prolonged hospital admission for blood clots.  She is on Eliquis.  Some sort of mental status change here this morning.  Really hard to know when her last known normal was.  Phone call by family was received 2 hours ago as patient was lethargic  and not want to eat or drink.  Family thought maybe she is a little bit lethargic yesterday.  Patient is not able to provide much history.  However she does follow commands, she has left-sided weakness at baseline.  She can tell me how many fingers I am holding up.  She can move her right side but not very well.  Differential is wide.  This could be infectious process or nonneurologic process such as stroke.  We will pursue work-up with CBC, blood cultures, urine studies, chest x-ray, head CT.  EKG per my review and interpretation shows sinus rhythm.  No ischemic changes.  Vital signs are normal per my review and interpretation.  Comorbidities include blood clot, stroke, high cholesterol, high blood pressure.  Radiology called me on the phone to state that CT scan shows acute/subacute infarct of the right PCA.  There is petechial hemorrhage.  There is some local mass effect with partial effacement of the right lateral ventricle.  She has unchanged meningioma as well.  Overall talked with Dr. Lorrin Goodell with neurology who states that yet this is probably over the course the last day or 2.  We do not know a last normal and this appears to be likely a subacute stroke.  Without activated code stroke.  Will admit to medicine. Overall per my review and interpretation labs thus far there is no significant anemia or electrolyte abnormality or kidney injury.  Lactic acid is normal.  Will admit to  medicine for further care.  This chart was dictated using voice recognition software.  Despite best efforts to proofread,  errors can occur which can change the documentation meaning.         Final Clinical Impression(s) / ED Diagnoses Final diagnoses:  Cerebrovascular accident (CVA), unspecified mechanism [facility])    Rx / DC Orders ED Discharge Orders     None         Lennice Sites, DO 03/01/22 1225

## 2022-03-02 ENCOUNTER — Inpatient Hospital Stay (HOSPITAL_COMMUNITY): Payer: Medicare Other

## 2022-03-02 ENCOUNTER — Encounter (HOSPITAL_COMMUNITY): Payer: Self-pay | Admitting: Internal Medicine

## 2022-03-02 DIAGNOSIS — I63531 Cerebral infarction due to unspecified occlusion or stenosis of right posterior cerebral artery: Secondary | ICD-10-CM | POA: Diagnosis not present

## 2022-03-02 DIAGNOSIS — Z86711 Personal history of pulmonary embolism: Secondary | ICD-10-CM

## 2022-03-02 DIAGNOSIS — D329 Benign neoplasm of meninges, unspecified: Secondary | ICD-10-CM | POA: Diagnosis not present

## 2022-03-02 DIAGNOSIS — I639 Cerebral infarction, unspecified: Secondary | ICD-10-CM

## 2022-03-02 LAB — CBC
HCT: 30.4 % — ABNORMAL LOW (ref 36.0–46.0)
Hemoglobin: 9.4 g/dL — ABNORMAL LOW (ref 12.0–15.0)
MCH: 29.3 pg (ref 26.0–34.0)
MCHC: 30.9 g/dL (ref 30.0–36.0)
MCV: 94.7 fL (ref 80.0–100.0)
Platelets: 151 10*3/uL (ref 150–400)
RBC: 3.21 MIL/uL — ABNORMAL LOW (ref 3.87–5.11)
RDW: 15.6 % — ABNORMAL HIGH (ref 11.5–15.5)
WBC: 4.8 10*3/uL (ref 4.0–10.5)
nRBC: 0 % (ref 0.0–0.2)

## 2022-03-02 LAB — URINALYSIS, ROUTINE W REFLEX MICROSCOPIC
Bilirubin Urine: NEGATIVE
Glucose, UA: NEGATIVE mg/dL
Hgb urine dipstick: NEGATIVE
Ketones, ur: NEGATIVE mg/dL
Nitrite: NEGATIVE
Protein, ur: 30 mg/dL — AB
Specific Gravity, Urine: 1.019 (ref 1.005–1.030)
WBC, UA: >50 WBC/hpf — ABNORMAL HIGH (ref 0–5)
pH: 7 (ref 5.0–8.0)

## 2022-03-02 LAB — BASIC METABOLIC PANEL
Anion gap: 7 (ref 5–15)
BUN: 15 mg/dL (ref 8–23)
CO2: 23 mmol/L (ref 22–32)
Calcium: 8.6 mg/dL — ABNORMAL LOW (ref 8.9–10.3)
Chloride: 101 mmol/L (ref 98–111)
Creatinine, Ser: 0.85 mg/dL (ref 0.44–1.00)
GFR, Estimated: >60 mL/min (ref >60)
Glucose, Bld: 215 mg/dL — ABNORMAL HIGH (ref 70–99)
Potassium: 3.7 mmol/L (ref 3.5–5.1)
Sodium: 131 mmol/L — ABNORMAL LOW (ref 135–145)

## 2022-03-02 LAB — HEMOGLOBIN A1C
Hgb A1c MFr Bld: 5 % (ref 4.8–5.6)
Mean Plasma Glucose: 96.8 mg/dL

## 2022-03-02 LAB — LIPID PANEL
Cholesterol: 97 mg/dL (ref 0–200)
HDL: 35 mg/dL — ABNORMAL LOW (ref >40)
LDL Cholesterol: 48 mg/dL (ref 0–99)
Total CHOL/HDL Ratio: 2.8 RATIO
Triglycerides: 68 mg/dL (ref <150)
VLDL: 14 mg/dL (ref 0–40)

## 2022-03-02 MED ORDER — HEPARIN SODIUM (PORCINE) 5000 UNIT/ML IJ SOLN
5000.0000 [IU] | Freq: Three times a day (TID) | INTRAMUSCULAR | Status: DC
  Administered 2022-03-03 – 2022-03-05 (×8): 5000 [IU] via SUBCUTANEOUS
  Filled 2022-03-02 (×8): qty 1

## 2022-03-02 MED ORDER — ROSUVASTATIN CALCIUM 20 MG PO TABS
20.0000 mg | ORAL_TABLET | Freq: Every day | ORAL | Status: DC
  Administered 2022-03-02 – 2022-03-05 (×3): 20 mg via ORAL
  Filled 2022-03-02 (×4): qty 1

## 2022-03-02 NOTE — Plan of Care (Signed)
  Problem: Education: Goal: Knowledge of disease or condition will improve Outcome: Progressing Goal: Knowledge of secondary prevention will improve (MUST DOCUMENT ALL) Outcome: Progressing Goal: Knowledge of patient specific risk factors will improve Cassandra Dodson N/A or DELETE if not current risk factor) Outcome: Progressing

## 2022-03-02 NOTE — Hospital Course (Signed)
81 year old woman from [place] CVA with residual left-sided deficits and dysphagia, and GI, followed by neurosurgery, presented with lethargy and altered mental status.  Admitted for acute CVA.

## 2022-03-02 NOTE — Progress Notes (Signed)
PT Cancellation Note  Patient Details Name: Cassandra Dodson MRN: [id number] DOB: [birth year]   Cancelled Treatment:    Reason Eval/Treat Not Completed: Patient at procedure or test/unavailable  Patient receiving nursing care. Will return later today.   [place]  Office [phone]  Rexanne Mano 03/02/2022, 10:49 AM

## 2022-03-02 NOTE — Progress Notes (Addendum)
STROKE TEAM PROGRESS NOTE   SUBJECTIVE (INTERVAL HISTORY) Her son is at the bedside.  Pt is sleepy drowsy but eventually eyes open and answers questions and following commands. Seems to have left hemianopia but not quite cooperative with exam. Still has left sided weakness from previous stroke but son reported left arm swollen for a month after the PE. Will need to rule out DVT.    OBJECTIVE Temp:  [97.8 F (36.6 C)-99.5 F (37.5 C)] 98.8 F (37.1 C) (11/11 0825) Pulse Rate:  [43-107] 94 (11/11 0825) Cardiac Rhythm: Normal sinus rhythm (11/11 0727) Resp:  [15-30] 17 (11/11 0825) BP: (108-151)/(48-90) 123/66 (11/11 0825) SpO2:  [73 %-100 %] 100 % (11/11 0825) Weight:  [71.4 kg] 71.4 kg (11/10 2030)  No results for input(s): "GLUCAP" in the last 168 hours. Recent Labs  Lab 03/01/22 1024 03/02/22 0238  NA 133* 131*  K 4.4 3.7  CL 100 101  CO2 23 23  GLUCOSE 130* 215*  BUN 14 15  CREATININE 0.91 0.85  CALCIUM 9.2 8.6*   Recent Labs  Lab 03/01/22 1024  AST 21  ALT 8  ALKPHOS 54  BILITOT 0.9  PROT 5.5*  ALBUMIN 2.6*   Recent Labs  Lab 03/01/22 1024 03/02/22 0238  WBC 6.1 4.8  NEUTROABS 3.8  --   HGB 10.1* 9.4*  HCT 33.0* 30.4*  MCV 95.4 94.7  PLT 169 151   No results for input(s): "CKTOTAL", "CKMB", "CKMBINDEX", "TROPONINI" in the last 168 hours. No results for input(s): "LABPROT", "INR" in the last 72 hours. Recent Labs    03/01/22 1023  COLORURINE YELLOW  LABSPEC 1.019  PHURINE 7.0  GLUCOSEU NEGATIVE  HGBUR NEGATIVE  BILIRUBINUR NEGATIVE  KETONESUR NEGATIVE  PROTEINUR 30*  NITRITE NEGATIVE  LEUKOCYTESUR LARGE*       Component Value Date/Time   CHOL 97 03/02/2022 0238   TRIG 68 03/02/2022 0238   HDL 35 (L) 03/02/2022 0238   CHOLHDL 2.8 03/02/2022 0238   VLDL 14 03/02/2022 0238   LDLCALC 48 03/02/2022 0238   Lab Results  Component Value Date   HGBA1C 5.0 03/02/2022   No results found for: "LABOPIA", "COCAINSCRNUR", "LABBENZ", "AMPHETMU",  "THCU", "LABBARB"  No results for input(s): "ETH" in the last 168 hours.  I have personally reviewed the radiological images below and agree with the radiology interpretations.  CT ANGIO HEAD NECK W WO CM  Result Date: 03/01/2022 CLINICAL DATA:  Infarcts on same-day MRI, increased lethargy and altered mental status EXAM: CT ANGIOGRAPHY HEAD AND NECK TECHNIQUE: Multidetector CT imaging of the head and neck was performed using the standard protocol during bolus administration of intravenous contrast. Multiplanar CT image reconstructions and MIPs were obtained to evaluate the vascular anatomy. Carotid stenosis measurements (when applicable) are obtained utilizing NASCET criteria, using the distal internal carotid diameter as the denominator. RADIATION DOSE REDUCTION: This exam was performed according to the departmental dose-optimization program which includes automated exposure control, adjustment of the mA and/or kV according to patient size and/or use of iterative reconstruction technique. CONTRAST:  67m OMNIPAQUE IOHEXOL 350 MG/ML SOLN COMPARISON:  01/16/2022 CTA head; correlation is also made with CT head 03/01/2022 and MRI head 03/01/2022 FINDINGS: CT HEAD FINDINGS Brain: Redemonstrated acute to early subacute right PCA territory infarct in the posterior right temporal and occipital lobes, with petechial hemorrhage. Mild local mass effect with effacement of the sulci and narrowing of the right lateral ventricle atrium and occipital horn. Redemonstrated large meningioma extending from the planum sphenoidale, which measures  up to 5.1 x 3.7 cm in the axial plane (series 5, image 19), previously 5.0 x 3.8 cm, unchanged when accounting for differences in scan plane. Unchanged associated mass effect and surrounding edema with 1.6 cm left-to-right midline shift at the level of the anterior falx. The punctate infarct in the right frontoparietal white matter is not appreciated on this exam. No evidence of  additional acute infarct or hemorrhage. No hydrocephalus or extra-axial fluid collection. Periventricular white matter changes, likely the sequela of chronic small vessel ischemic disease. Vascular: No hyperdense vessel. Atherosclerotic calcifications in the intracranial carotid and vertebral arteries. Basilar artery stent. Skull: Normal. Negative for fracture or focal lesion. Sinuses/Orbits: Clear paranasal sinuses. The orbits are unremarkable. Other: The mastoid air cells are well aerated. CTA NECK FINDINGS Aortic arch: Two-vessel arch with a common origin of the brachiocephalic and left common carotid arteries. Imaged portion shows no evidence of aneurysm or dissection. No significant stenosis of the major arch vessel origins. Aortic atherosclerosis. Right carotid system: No evidence of dissection, occlusion, or hemodynamically significant stenosis (greater than 50%). Atherosclerotic disease at the bifurcation and in the proximal ICA is not hemodynamically significant. Left carotid system: Approximately 50% stenosis in the proximal left ICA. No evidence of dissection or occlusion. Vertebral arteries: No evidence of dissection, occlusion, or hemodynamically significant stenosis (greater than 50%). Skeleton: No acute osseous abnormality. Other neck: Negative. Upper chest: No focal pulmonary opacity or pleural effusion. Emphysema. Review of the MIP images confirms the above findings CTA HEAD FINDINGS Anterior circulation: Both internal carotid arteries are patent to the termini, with mild stenosis in the bilateral cavernous segments. A1 segments patent, with mild focal stenosis in the proximal right A1 and proximal to mid left A1. Normal anterior communicating artery. Anterior cerebral arteries are patent to their distal aspects. No M1 stenosis or occlusion. MCA branches perfused and symmetric. Posterior circulation: At least moderate stenosis throughout the left V4 segment, with focal near occlusion distally  (series 10, image 217). The right vertebral artery is patent to the vertebrobasilar junction. Posterior inferior cerebellar arteries patent proximally. Redemonstrated stent within the distal basilar artery, which limits evaluation for patency. There is moderate stenosis just proximal to the stent (series 10, image 237). Basilar patent to its distal aspect. Superior cerebellar arteries patent proximally. Multifocal narrowing in the right P1, with additional more significant moderate stenosis at the right P1/P2 junction (series 10, image 254) and in the mid to distal A2 (series 10, image 248), and severe stenosis in the proximal right P3 (series 10, image 250). The left P1 is not well visualized and may be hypoplastic or stenosed. Fetal or near fetal origin of the left PCA from a prominent posterior communicating artery. The right posterior communicating artery is not visualized. The posterior circulation appears overall similar to the prior exam. Venous sinuses: As permitted by contrast timing, patent. Anatomic variants: Fetal or near fetal origin of the left PCA. Review of the MIP images confirms the above findings IMPRESSION: 1. Redemonstrated acute to early subacute right PCA territory infarct, with petechial hemorrhage and mild local mass effect. 2. Multifocal narrowing in the posterior circulation, with near occlusion in the distal left V4 and right PCA and severe stenosis in the proximal right P3, as well as moderate narrowing in the remainder of the right PCA and left V4. In addition, there is moderate stenosis just proximal to the stent in the distal basilar artery. These stenoses appear unchanged from the prior exam. 3. Mild stenosis in the bilateral  cavernous ICAs and proximal right A1 and proximal to mid left. 4. Approximately 50% stenosis in the proximal left ICA. No other hemodynamically significant stenosis in the neck. 5. Large planum sphenoidale meningioma, with associated mass effect and 1.6 cm  left-to-right midline shift. 6. Aortic atherosclerosis and emphysema. Aortic Atherosclerosis (ICD10-I70.0) and Emphysema (ICD10-J43.9). Electronically Signed   By: Merilyn Baba M.D.   On: 03/01/2022 19:56   MR BRAIN WO CONTRAST  Result Date: 03/01/2022 CLINICAL DATA:  Provided history: Stroke, follow-up. EXAM: MRI HEAD WITHOUT CONTRAST TECHNIQUE: Multiplanar, multiecho pulse sequences of the brain and surrounding structures were obtained without intravenous contrast. COMPARISON:  Head CT performed earlier today 03/01/2022. Brain MRI 01/13/2022. CTA head 01/16/2022. FINDINGS: Intermittently motion degraded examination, limiting evaluation. Most notably, the sagittal T1 weighted sequence is severely motion degraded, the axial T2 sequence is severely motion degraded, the axial T1 weighted sequence is severely motion degraded and the coronal T2 sequence is severely motion degraded. Brain: Mild generalized cerebral atrophy. 5.5 x 4.0 cm focus of cortical and subcortical restricted diffusion and heterogeneous signal abnormality within the right temporal and occipital lobes, compatible with an early subacute infarct. Ill-defined hemorrhagic conversion within portions of the infarction territory. Local mass effect with partial effacement of the right lateral ventricle at the level of the anterior falx 3 mm acute/early subacute infarct within the right frontoparietal white matter (for instance as seen on series 2, image 35). Redemonstrated tiny chronic cortical infarct within the high right parietal lobe (series 6, image 19). Chronic infarcts within the right corona radiata/caudate nucleus, left corona radiata, left thalamus, right aspect of the pons and within the bilateral cerebellar hemispheres. Background moderate multifocal T2 FLAIR hyperintensity within the cerebral white matter, nonspecific but compatible with chronic small vessel disease. Chronic small vessel ischemic changes are also present within the  bilateral middle cerebellar peduncles. Redemonstrated large planum sphenoidale meningioma, measuring 5.0 x 3.8 cm in transaxial dimensions. As before, there is significant regional mass effect with moderate vasogenic edema in the adjacent anterior left frontal lobe, and 1.5 cm rightward midline shift measured at the anterior falx. No extra-axial fluid collection. Vascular: There is signal abnormality within the distal intracranial left vertebral artery, and correlating with the prior CTA head of 01/16/2022, this likely reflects focal vessel occlusion. Basilar artery stent better appreciated on the head CT performed earlier today. Skull and upper cervical spine: Within the limitations of motion degradation, no focal suspicious marrow lesion is identified. Sinuses/Orbits: No mass or acute finding within the imaged orbits. Trace mucosal thickening within the bilateral ethmoid air cells. Other: Trace fluid within the right mastoid air cells. IMPRESSION: Intermittently motion degraded examination, limiting evaluation. 5.5 x 4.0 cm subacute cortical and subcortical right PCA territory infarct with ill-defined hemorrhagic conversion. Resultant local mass effect with partial effacement of the right lateral ventricle. 3 mm acute/early subacute infarct within the right frontoparietal white matter. Redemonstrated large planum sphenoidale meningioma with significant regional mass effect, moderate vasogenic edema in the adjacent anterior left frontal lobe and 1.5 cm rightward midline shift at the level of the anterior falx. Background cerebral atrophy, chronic small vessel ischemic disease and chronic infarcts, as detailed. Probable focal occlusion of the distal V4 left vertebral artery, as was demonstrated on the prior CTA head of 01/16/2022. Electronically Signed   By: Kellie Simmering D.O.   On: 03/01/2022 15:28   DG Chest Portable 1 View  Result Date: 03/01/2022 CLINICAL DATA:  Altered mental status. EXAM: PORTABLE CHEST 1  VIEW COMPARISON:  January 30, 2022. FINDINGS: The heart size and mediastinal contours are within normal limits. Both lungs are clear. The visualized skeletal structures are unremarkable. IMPRESSION: No active disease. Electronically Signed   By: Marijo Conception M.D.   On: 03/01/2022 11:32   CT Head Wo Contrast  Result Date: 03/01/2022 CLINICAL DATA:  Provided history: Mental status change, unknown cause. Increased lethargy. Altered mental status. EXAM: CT HEAD WITHOUT CONTRAST TECHNIQUE: Contiguous axial images were obtained from the base of the skull through the vertex without intravenous contrast. RADIATION DOSE REDUCTION: This exam was performed according to the departmental dose-optimization program which includes automated exposure control, adjustment of the mA and/or kV according to patient size and/or use of iterative reconstruction technique. COMPARISON:  Prior head CT examinations 01/26/2022 and earlier. Brain MRI 01/14/2019 FINDINGS: Brain: Mild generalized cerebral atrophy. Acute/early subacute appearing cortical and subcortical right PCA territory infarct within portions of the right temporal and occipital lobes, measuring 6.5 x 3.3 cm in transaxial dimensions. Curvilinear foci of hyperdensity within the infarction territory compatible with petechial hemorrhage. Local mass effect with partial effacement of the atrium, occipital horn and temporal horn of the right lateral ventricle. 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 Unchanged from the prior head CT of 01/26/2022, this measures 5.0 x 3.8 cm in transaxial dimensions. Also unchanged, there is regional mass effect, moderate vasogenic edema within the underlying anterior left frontal lobe and 1.5 cm rightward midline shift at the level of the anterior falx. Background moderate chronic small vessel ischemic changes within the cerebral white matter. Chronic small vessel ischemic changes are also present within the pons, bilateral  middle cerebellar peduncles and bilateral cerebellar hemispheres. Redemonstrated chronic infarcts within the right corona radiata/caudate nucleus, left corona radiata, left thalamus, within the right aspect of the pons and within the right cerebellar hemisphere. 1.6 x 0.6 cm infarct within the left cerebellar hemisphere, not definitively present on prior examinations and age-indeterminate. No extra-axial fluid collection. Vascular: No hyperdense vessel. Basilar artery stent. Atherosclerotic calcifications. Skull: No fracture or aggressive osseous lesion. Sinuses/Orbits: No mass or acute finding within the imaged orbits. Small-volume frothy secretions within a posterior right ethmoid air cell. Small-volume fluid within a posterior left ethmoid air cell. Impression #1 called by telephone at the time of interpretation on 03/01/2022 at 11:29 am to provider Cassandra Dodson , who verbally acknowledged these results. IMPRESSION: Acute/early subacute appearing cortical and subcortical right PCA territory infarct within the right temporal and occipital lobes. Petechial hemorrhage within the infarction territory. Local mass effect with partial effacement the right lateral ventricle. 1.6 cm infarct within the left cerebellar hemisphere, not definitively present on prior exams and age-indeterminate, possibly acute/subacute. Background cerebral atrophy, chronic small vessel ischemic disease and chronic infarcts as described. 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 Mild bilateral ethmoid sinusitis. Electronically Signed   By: Kellie Simmering D.O.   On: 03/01/2022 11:31   DG Swallowing Func-Speech Pathology  Result Date: 02/04/2022 Table formatting from the original result was not included. Objective Swallowing Evaluation: Type of Study: MBS-Modified Barium  Swallow Study  Patient Details Name: Cassandra Dodson MRN: [id number] Date of Birth: 1940-08-03 Today's Date: 02/04/2022 Time: SLP Start Time (ACUTE ONLY): 1010 -SLP Stop Time (ACUTE ONLY): 1028 SLP Time Calculation (min) (ACUTE ONLY): 18 min Past Medical History: Past Medical History: Diagnosis Date  Arthritis  CKD (chronic kidney disease), stage III ([place])   patient denies  Depression   Diabetes mellitus without complication ([place])   Difficult intravenous access   Endometrial cancer ([place])   GERD (gastroesophageal reflux disease)   Headache   History of radiation therapy 11/04/2019-12/01/2019  Endometrial HDR; Dr. Gery Pray  Hypertension   Hypothyroidism   Neuromuscular disorder ([place])   neuropathy in feet  Osteoarthritis   PMB (postmenopausal bleeding)   PONV (postoperative nausea and vomiting)   severe nausea and vomiting after knee replacement 06-2014, did ok with 2018 knee replacement  Urinary frequency   Wears glasses  Past Surgical History: Past Surgical History: Procedure Laterality Date  BREAST SURGERY    cyst removed  CESAREAN SECTION    DILATION AND CURETTAGE OF UTERUS N/A 06/24/2019  Procedure: DILATATION AND CURETTAGE;  Surgeon: Lafonda Mosses, MD;  Location: [facility];  Service: Gynecology;  Laterality: N/A;  FRACTURE SURGERY    right knee  INTRAUTERINE DEVICE (IUD) INSERTION N/A 06/24/2019  Procedure: INTRAUTERINE DEVICE (IUD) INSERTION MIRENA;  Surgeon: Lafonda Mosses, MD;  Location: [facility];  Service: Gynecology;  Laterality: N/A;  IR ANGIO INTRA EXTRACRAN SEL COM CAROTID INNOMINATE UNI R MOD SED  11/05/2021  IR ANGIO VERTEBRAL SEL VERTEBRAL UNI R MOD SED  11/05/2021  IR ANGIOGRAM PULMONARY BILATERAL SELECTIVE  01/28/2022  IR ANGIOGRAM SELECTIVE EACH ADDITIONAL VESSEL  01/28/2022  IR ANGIOGRAM SELECTIVE EACH ADDITIONAL VESSEL  01/28/2022  IR CT HEAD LTD  11/01/2021  IR INTRA CRAN STENT  11/01/2021  IR RADIOLOGIST EVAL & MGMT  12/19/2021  IR THROMBECT PRIM MECH INIT  (INCLU) MOD SED  01/28/2022  IR THROMBECT PRIM MECH INIT (INCLU) MOD SED  01/28/2022  IR US GUIDE VASC ACCESS RIGHT  11/01/2021  IR US GUIDE VASC ACCESS RIGHT  01/28/2022  IRRIGATION AND DEBRIDEMENT SEBACEOUS CYST    JOINT REPLACEMENT Right   right  LYMPH NODE DISSECTION N/A 09/14/2019  Procedure: LYMPH NODE DISSECTION;  Surgeon: Lafonda Mosses, MD;  Location: [facility];  Service: Gynecology;  Laterality: N/A;  RADIOLOGY WITH ANESTHESIA N/A 11/01/2021  Procedure: ANGIOPLASTY/STENT;  Surgeon: Luanne Bras, MD;  Location: [facility];  Service: Radiology;  Laterality: N/A;  RADIOLOGY WITH ANESTHESIA N/A 01/28/2022  Procedure: IR WITH ANESTHESIA;  Surgeon: Radiologist, Medication, MD;  Location: [facility];  Service: Radiology;  Laterality: N/A;  ROBOTIC ASSISTED TOTAL HYSTERECTOMY WITH BILATERAL SALPINGO OOPHERECTOMY Bilateral 09/14/2019  Procedure: XI ROBOTIC ASSISTED TOTAL HYSTERECTOMY WITH BILATERAL SALPINGO OOPHORECTOMY;  Surgeon: Lafonda Mosses, MD;  Location: [facility];  Service: Gynecology;  Laterality: Bilateral;  SENTINEL NODE BIOPSY N/A 09/14/2019  Procedure: SENTINEL NODE BIOPSY;  Surgeon: Lafonda Mosses, MD;  Location: [facility];  Service: Gynecology;  Laterality: N/A;  teeth extration    TONSILLECTOMY    TOTAL HIP ARTHROPLASTY Right 02/23/2015  Procedure: RIGHT TOTAL HIP ARTHROPLASTY ANTERIOR APPROACH;  Surgeon: Leandrew Koyanagi, MD;  Location: [facility];  Service: Orthopedics;  Laterality: Right;  TOTAL KNEE ARTHROPLASTY Left 06/27/2016  Procedure: LEFT TOTAL KNEE ARTHROPLASTY;  Surgeon: Leandrew Koyanagi, MD;  Location: [facility];  Service: Orthopedics;  Laterality: Left; HPI: KORYN CHARLOT is a 81 y.o. female with medical history significant for early stage uterine cancer, hypertension, type 2 diabetes, hypothyroidism, GERD, CKD 3B, chronic anxiety/depression, osteoarthritis, CVA and meningioma with left-sided deficit and speech difficulty, who presented to [facility] ED due to confusion x 2 days. In the ED, work-up revealed CT head with  stable meningioma measuring 5  cm and associated vasogenic edema and regional mass effect.  Underwent IR thrombectomy 10/9. FEES 10/2021 penetration thin and nectar, wet vocal quality throughout, D3/nectar recommended.  Subjective: cooperative but confused  Recommendations for follow up therapy are one component of a multi-disciplinary discharge planning process, led by the attending physician.  Recommendations may be updated based on patient status, additional functional criteria and insurance authorization. Assessment / Plan / Recommendation   02/04/2022  10:43 AM Clinical Impressions Clinical Impression Pt presents with severe oropharyngeal dysphagia largely decreased timing of laryngeal closure/swallow initiation delay to the level of the valleculae, which did not improve with any consistency. During thin and nectar thick liquid trials bolus material spilled from the valleculae into the laryngeal vestibule prior to the initiation of the swallow. Silent aspiration (PAS 8) noted with thin liquids, and penetration (PAS 3/5) noted with nectar thick liquids prior to the swallow. Residue from thin/nectar thick liquids remained in laryngeal vestibule throughout study. Residue in the valleculae and pyriform sinuses present with trials of nectar and honey thick liquids, which increased in amount and was noted in the lateral channels with puree administration. Prolonged oral transit also present with puree adminsitration. Instances of penetration displayed with honey thick liquids via cup (PAS 2). Throughout study pt did not sense bolus material in airway and was cognitively unable to follow commands to cough/throat clear. Anatomically, pt presents with a cricopharyngeal bar. Recommend pt start honey thick liquids via teaspoon only/puree consistency diet, medications crushed in puree. MD consulted and agreed with diet recommendations given aspiration risk and may consult Palliative care. SLP will follow for diet toleration  and education. SLP Visit Diagnosis Dysphagia, oropharyngeal phase (R13.12) Impact on safety and function Severe aspiration risk     02/04/2022  10:43 AM Treatment Recommendations Treatment Recommendations Therapy as outlined in treatment plan below     02/04/2022  10:43 AM Prognosis Prognosis for Safe Diet Advancement Fair Barriers to Reach Goals Severity of deficits;Cognitive deficits   02/04/2022  10:43 AM Diet Recommendations SLP Diet Recommendations Honey thick liquids;Dysphagia 1 (Puree) solids;Other (Comment) Liquid Administration via Spoon Medication Administration Whole meds with puree Compensations Slow rate;Small sips/bites;Minimize environmental distractions Postural Changes Seated upright at 90 degrees     02/04/2022  10:43 AM Other Recommendations Oral Care Recommendations Oral care BID Follow Up Recommendations Long-term institutional care without follow-up therapy Assistance recommended at discharge Frequent or constant Supervision/Assistance Functional Status Assessment Patient has had a recent decline in their functional status and demonstrates the ability to make significant improvements in function in a reasonable and predictable amount of time.   02/04/2022  10:43 AM Frequency and Duration  Speech Therapy Frequency (ACUTE ONLY) min 2x/week Treatment Duration 2 weeks     02/04/2022  10:43 AM Oral Phase Oral Phase Impaired Oral - Honey Teaspoon WFL Oral - Honey Cup WFL Oral - Nectar Teaspoon NT Oral - Nectar Cup WFL Oral - Nectar Straw NT Oral - Thin Teaspoon NT Oral - Thin Cup WFL Oral - Thin Straw NT Oral - Puree Delayed oral transit;Decreased bolus cohesion Oral - Mech Soft NT Oral - Regular NT Oral - Multi-Consistency NT Oral - Pill NT    02/04/2022  10:43 AM Pharyngeal Phase Pharyngeal Phase Impaired Pharyngeal- Honey Teaspoon Delayed swallow initiation-vallecula;Reduced pharyngeal peristalsis;Penetration/Aspiration during swallow;Pharyngeal residue - pyriform;Pharyngeal residue - valleculae  Pharyngeal Material enters airway, remains ABOVE vocal cords and not ejected out Pharyngeal- Honey Cup Delayed swallow initiation-vallecula;Delayed swallow initiation-pyriform sinuses;Penetration/Aspiration during swallow;Pharyngeal residue - pyriform;Pharyngeal residue - valleculae Pharyngeal  Material enters airway, remains ABOVE vocal cords and not ejected out Pharyngeal- Nectar Teaspoon NT Pharyngeal- Nectar Cup Delayed swallow initiation-vallecula;Penetration/Aspiration before swallow;Pharyngeal residue - pyriform;Pharyngeal residue - valleculae Pharyngeal Material enters airway, CONTACTS cords and not ejected out;Material enters airway, remains ABOVE vocal cords and not ejected out Pharyngeal- Nectar Straw NT Pharyngeal- Thin Teaspoon NT Pharyngeal- Thin Cup Trace aspiration;Penetration/Aspiration before swallow;Delayed swallow initiation-vallecula Pharyngeal Material enters airway, passes BELOW cords without attempt by patient to eject out (silent aspiration) Pharyngeal- Thin Straw NT Pharyngeal- Puree Delayed swallow initiation-vallecula;Pharyngeal residue - valleculae;Pharyngeal residue - pyriform;Lateral channel residue Pharyngeal Material does not enter airway Pharyngeal- Mechanical Soft NT Pharyngeal- Regular NT Pharyngeal- Multi-consistency NT Pharyngeal- Pill NT    02/04/2022  10:43 AM Cervical Esophageal Phase  Cervical Esophageal Phase -- [name] 02/04/2022, 11:43 AM                     DG Abd 1 View  Result Date: 02/03/2022 CLINICAL DATA:  NG tube placement EXAM: ABDOMEN - 1 VIEW COMPARISON:  02/01/2022 FINDINGS: There is interval removal of feeding tube. There is interval placement of NG tube which follows the course of trachea and right main bronchus with its tip in the medial right lower lung fields. NG tube should be removed and repositioned. Cardiac size is within normal limits. Thoracic aorta is tortuous and ectatic. Lung fields are clear of any focal consolidation or  pulmonary edema. Small linear density in the medial left lower lung fields may suggest subsegmental atelectasis. There is no pleural effusion or pneumothorax. Tip of right PICC line is seen in the region of junction of inferior vena cava and right atrium. IMPRESSION: Tip of NG tube is noted in the medial right lower lung field suggesting ectopic location in the bronchus. Lung fields are clear of any focal consolidation or pulmonary edema. Small linear density in medial left lower lung fields suggest subsegmental atelectasis. Tip of right PICC line is noted in the region of junction of inferior vena cava and right atrium. Imaging finding of NG tube to be in bronchus in the medial right lower lung field was relayed to Dr. Doristine Bosworth by telephone call at 11:56 a.m. on 02/03/2022. Electronically Signed   By: Elmer Picker M.D.   On: 02/03/2022 12:01   DG Abd Portable 1V  Result Date: 02/01/2022 CLINICAL DATA:  Feeding tube placement EXAM: PORTABLE ABDOMEN - 1 VIEW COMPARISON:  CT abdomen and pelvis 01/30/2019 FINDINGS: The bowel gas pattern is normal. Weighted enteric tube terminates in the Peri pyloric region. Gas distended stomach. Degenerative changes in the visualized lumbar spine. IMPRESSION: Weighted enteric tube terminates in the peripyloric region. Electronically Signed   By: Marin Roberts M.D.   On: 02/01/2022 15:54     PHYSICAL EXAM  Temp:  [97.8 F (36.6 C)-99.5 F (37.5 C)] 98.8 F (37.1 C) (11/11 0825) Pulse Rate:  [43-107] 94 (11/11 0825) Resp:  [15-30] 17 (11/11 0825) BP: (108-151)/(48-90) 123/66 (11/11 0825) SpO2:  [73 %-100 %] 100 % (11/11 0825) Weight:  [71.4 kg] 71.4 kg (11/10 2030)  General - Well nourished, well developed, drowsy and sleepy.  Ophthalmologic - fundi not visualized due to noncooperation.  Cardiovascular - Regular rhythm and rate.  Neuro - drowsy sleepy but eyes open with repetitive stimulation, orientated to place month and people, but would not tell me  her age or year. No aphasia, paucity of speech but no dysarthria, following all simple commands. Able to name and repeat. No gaze palsy,  tracking bilaterally, visual field testing not cooperative but suspect left hemianpia, PERRL. No facial droop. Tongue midline. RUE 4/5 RLE 3/5, but LUE 2/5 with swolloen and LLE mild withdraw to pain. Sensation, coordination not cooperative and gait not tested.   ASSESSMENT/PLAN [name] is a 81 y.o. female with history of hypertension, diabetes, endometrial cancer, PE in 01/2022 on Eliquis, strokes admitted for lethargy and altered mental status. No tPA given due to outside window.    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  Will consider resume Eliquis 5 days post stroke. Ongoing aggressive stroke risk factor management Therapy recommendations: SNF Disposition: Pending  History of stroke 10/2021 admitted for slurred speech, left-sided weakness.  CT showed large left precentral meningioma.  MRI showed right pontine large infarct.  CT head and neck marked stenosis, multifocal intracranial stenosis including moderate right P1/P2 stenosis.  EF 60 to 65%, LDL 133, A1c 7.0.  Discharged on DAPT and Crestor 20.  Later patient had bilateral stenting on 11/01/2021.  Also plan to follow-up with neurosurgery for possible left meningioma resection. 12/2021 admitted for nausea vomiting and worsening  left-sided weakness.  CT no acute finding.  MRI showed new right pontine punctate infarct.  CTA head and neck showed left V4 occlusion, right P1/P2 moderate to severe stenosis, however basilar artery stent patent.  EF 60 to 65%.  Discharged on DAPT and Crestor 20 and fenofibrate 160.  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  Was intubated and status post thrombectomy.  Anemia received blood transfusion.  Started heparin IV with thrombocytopenia.  HIT antibody negative.  Eventually heparin IV transition to Eliquis on discharge.  Aspirin and Plavix will discontinue at the time.  Diabetes HgbA1c 5.0 goal < 7.0 Controlled CBG monitoring SSI DM education and close PCP follow up  Hypertension Stable Long term BP goal normotensive  Hyperlipidemia Home meds: Crestor 20 LDL 48, goal < 70 Now on Crestor 20 Continue statin at discharge  Other Stroke Risk Factors Advanced age  Other Active Problems Left upper extremity swollen after PE last months per son, venous Doppler no DVT. Endometrial cancer  Hospital day # 1  I discussed with Dr. Sarajane Jews. I spent extensive time with the patient and her son, more than 50% of which was spent in counseling and coordination of care, reviewing test results, images and medication, and discussing the diagnosis, treatment plan and potential prognosis. This patient's care requiresreview of multiple databases, neurological assessment, discussion with family, other specialists and medical decision making of high complexity.    Rosalin Hawking, MD PhD Stroke Neurology 03/02/2022 12:26 PM    To contact Stroke Continuity provider, please refer to [email or web address] After hours, contact General Neurology

## 2022-03-02 NOTE — Evaluation (Signed)
Occupational Therapy Evaluation Patient Details Name: Cassandra Dodson MRN: [id number] DOB: 1941-02-14 Today's Date: 03/02/2022   History of Present Illness Pt is an 81 y.o female presenting to [facility] ED from [address] with increased lethargy and AMS. 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 Pt with recent hospital admission for AMS and was found to have PE 9/22-10/28. PMH significant for pulmonary embolism, frontal meningioma, CKD, CVA, basilar artery stent, and DM.   Clinical Impression   Pt presenting from SNF with AMS and lethargy. Continues with lethargy affecting participation in evaluation. Pt requiring max-total A for all ADL due to level of arousal at time of evaluation. Pt's son reports she has been like this since arrival. Pt following some commands "squeeze my hand" and verbalizing minimally throughout session. Pt with normal PROM on the R and with decreased shoulder flexion/abduction on L and notable edema throughout LUE. Recommending discharge to SNF for continued OT services to optimize participation in ADL.      Recommendations for follow up therapy are one component of a multi-disciplinary discharge planning process, led by the attending physician.  Recommendations may be updated based on patient status, additional functional criteria and insurance authorization.   Follow Up Recommendations  Skilled nursing-short term rehab (<3 hours/day)    Assistance Recommended at Discharge Frequent or constant Supervision/Assistance  Patient can return home with the following Direct supervision/assist for medications management;Direct supervision/assist for financial management;Assist for transportation;Help with stairs or ramp for entrance;Two people to help with bathing/dressing/bathroom;Assistance with cooking/housework;Two people to help with walking and/or transfers    Functional Status Assessment  Patient has had a recent decline in their  functional status and/or demonstrates limited ability to make significant improvements in function in a reasonable and predictable amount of time  Equipment Recommendations  Other (comment) (TBD next venue)    Recommendations for Other Services Speech consult     Precautions / Restrictions Precautions Precautions: Fall Precaution Comments: LUE residual deficits due to hx of CVAs and increased edema Restrictions Weight Bearing Restrictions: No      Mobility Bed Mobility Overal bed mobility: Needs Assistance             General bed mobility comments: +2 total to scoot up to [facility], elevated to chair position with pt keeping eyes closed, however with some verbalizations ("What?" "[place]ry...it's a name")    Transfers                   General transfer comment: unable to attempt due to pt lethargy/lack of participation      Balance Overall balance assessment: Needs assistance Sitting-balance support: No upper extremity supported Sitting balance-Leahy Scale: Poor Sitting balance - Comments: in chair posiition, leaning to her right and required total assist to correct                                   ADL either performed or assessed with clinical judgement   ADL Overall ADL's : Needs assistance/impaired Eating/Feeding: Maximal assistance;Bed level   Grooming: Wash/dry face;Maximal assistance;Bed level Grooming Details (indicate cue type and reason): hand over hand. Pt reporting "I can wash my face", bul limited active attempt to do so. Believe lethargy limiting ability to participate. Upper Body Bathing: Total assistance;Bed level   Lower Body Bathing: Total assistance;Bed level   Upper Body Dressing : Total assistance;Bed level   Lower Body Dressing:  Total assistance;Bed level     Toilet Transfer Details (indicate cue type and reason): deferred Toileting- Clothing Manipulation and Hygiene: Total assistance;Bed level         General ADL  Comments: Pt limited by lethargy.     Vision Patient Visual Report: Other (comment) (not opening eyes) Additional Comments: Not opening eyes this session, with preference for head turn R during session, but also lethargic.     Perception     Praxis      Pertinent Vitals/Pain Pain Assessment Pain Assessment: Faces Faces Pain Scale: Hurts even more Pain Location: LLE with PROM Pain Descriptors / Indicators: Guarding, Grimacing, Moaning Pain Intervention(s): Limited activity within patient's tolerance, Monitored during session     Hand Dominance Right   Extremity/Trunk Assessment Upper Extremity Assessment Upper Extremity Assessment: Generalized weakness;RUE deficits/detail;LUE deficits/detail;Difficult to assess due to impaired cognition RUE Deficits / Details: Grossly 3/5MMT shoulder flexion, 3+/5 grip. Following some commands to participate LUE Deficits / Details: LUE edema. PROM decresed with shoulder flexion to ~80 degrees, abduction to ~60 degrees with notable tightness in pecs and shoulder girdle. min wiggling of fingers for active movement. elbow flexion decreased, but WFL.   Lower Extremity Assessment Lower Extremity Assessment: Defer to PT evaluation RLE Deficits / Details: PROM WFL; grimaces with heelslide' LLE Deficits / Details: PROM WFL, no active assist   Cervical / Trunk Assessment Cervical / Trunk Assessment: Other exceptions Cervical / Trunk Exceptions: overweight   Communication Communication Communication: HOH;Expressive difficulties (Per son, sometimes experiences "word salad")   Cognition Arousal/Alertness: Lethargic Behavior During Therapy: Flat affect Overall Cognitive Status: Difficult to assess                                 General Comments: did follow commands to squeeze with rt hand and to raise RUE, otherwise lethargic     General Comments  Son present at beginning of session and providing history    Exercises Other  Exercises Other Exercises: PROM Bil UE, AAROM of RUE for shoulder flexion and grasp   Shoulder Instructions      Home Living Family/patient expects to be discharged to:: Skilled nursing facility                                        Prior Functioning/Environment Prior Level of Function : Needs assist             Mobility Comments: per son, was walking a few steps at SNF; if works with therapy hard one day, the next day she will sleep all day ADLs Comments: received assistance with ADL at SNF        OT Problem List: Decreased strength;Decreased range of motion;Decreased activity tolerance;Impaired balance (sitting and/or standing);Decreased cognition;Decreased safety awareness;Obesity;Impaired sensation;Pain      OT Treatment/Interventions: Self-care/ADL training;Therapeutic exercise;Therapeutic activities;DME and/or AE instruction;Balance training;Patient/family education    OT Goals(Current goals can be found in the care plan section) Acute Rehab OT Goals Patient Stated Goal: none stated OT Goal Formulation: Patient unable to participate in goal setting Time For Goal Achievement: 03/16/22 Potential to Achieve Goals: Fair  OT Frequency: Min 2X/week    Co-evaluation PT/OT/SLP Co-Evaluation/Treatment: Yes Reason for Co-Treatment: For patient/therapist safety PT goals addressed during session: Mobility/safety with mobility OT goals addressed during session: ADL's and self-care      AM-PAC OT "  6 Clicks" Daily Activity     Outcome Measure Help from another person eating meals?: Total Help from another person taking care of personal grooming?: Total Help from another person toileting, which includes using toliet, bedpan, or urinal?: Total Help from another person bathing (including washing, rinsing, drying)?: Total Help from another person to put on and taking off regular upper body clothing?: Total Help from another person to put on and taking off regular  lower body clothing?: Total 6 Click Score: 6   End of Session Nurse Communication: Mobility status  Activity Tolerance: Patient limited by lethargy Patient left: in bed;with call bell/phone within reach;with bed alarm set  OT Visit Diagnosis: Unsteadiness on feet (R26.81);Other abnormalities of gait and mobility (R26.89);Muscle weakness (generalized) (M62.81);Other symptoms and signs involving the nervous system (R29.898);Other symptoms and signs involving cognitive function;Pain Pain - Right/Left:  (generalized)                Time: 8110-3159 OT Time Calculation (min): 24 min Charges:  OT General Charges $OT Visit: 1 Visit OT Evaluation $OT Eval Moderate Complexity: 1 Mod  Shanda Howells, OTR/L [facility] Acute Rehabilitation Office: [phone]   [id number] T [id number] 03/02/2022, 1:48 PM

## 2022-03-02 NOTE — Progress Notes (Signed)
Upper extremity venous left study completed.   Please see CV Proc for preliminary results.   Yarisbel Miranda, RDMS, RVT   

## 2022-03-02 NOTE — Progress Notes (Signed)
  Progress Note   Patient: Cassandra Dodson:[id number] DOB: 02-Mar-1941 DOA: 03/01/2022     1 DOS: the patient was seen and examined on 03/02/2022   Brief hospital course: 81 year old woman from [facility] PMH CVA with residual left-sided deficits and dysphagia, and GI, followed by neurosurgery, presented with lethargy and altered mental status.  Admitted for acute CVA.  Assessment and Plan: Acute right PCA stroke with hemorrhagic conversion, early subacute/acute infarct right frontoparietal white matter,, likely caused by PCA stenosis, small infarct left cerebellar hemisphere age-indeterminate, PMH CVA with residual left-sided weakness -- Continue management per nephrology, currently with aspirin.  Resume statin.  Anticoagulation will be deferred for several days per neurology. --Complete stroke work-up.  Further recommendations per neurology.   PMH PE on chronic anticoagulation --During hospitalization in October patient required intubation after being found to be hypoxic with saddle PE.   --Holding Eliquis due to acute stroke with hemorrhagic transformation.   Normocytic anemia --Chronic.  Hemoglobin 10.1 g/dL which appears improved from prior. 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 --Continue outpatient follow-up with neurosurgery   Endometrial cancer -Continue outpatient follow-up with gynecologic oncology     Subjective:  Feels ok History may not be reliable  Physical Exam: Vitals:   03/02/22 0330 03/02/22 0825 03/02/22 1255 03/02/22 1606  BP: 108/62 123/66 (!) 109/49 139/71  Pulse: (!) 104 94 (!) 42 81  Resp: '17 17 17 16  '$ Temp: 99.5 F (37.5 C) 98.8 F (37.1 C) 99.2 F (37.3 C) 98.7 F (37.1 C)  TempSrc: Oral Oral Oral Oral  SpO2: 100% 100% 92% 94%   Weight:      Height:       Physical Exam Vitals reviewed.  Constitutional:      General: She is not in acute distress.    Appearance: She is not ill-appearing or toxic-appearing.  Cardiovascular:     Rate and Rhythm: Normal rate and regular rhythm.     Heart sounds: No murmur heard.    Comments: Telemetry SR Pulmonary:     Effort: Pulmonary effort is normal. No respiratory distress.     Breath sounds: No wheezing, rhonchi or rales.  Neurological:     Mental Status: She is alert.  Psychiatric:        Mood and Affect: Mood normal.        Behavior: Behavior normal.    Data Reviewed: Na+ 131 Hgb 9.4  Family Communication: none present  Disposition: Status is: Inpatient Remains inpatient appropriate because: stroke  Planned Discharge Destination:  TBD    Time spent: 35 minutes  Author: Murray Hodgkins, MD 03/02/2022 6:09 PM  For on call review [email or web address].

## 2022-03-02 NOTE — Evaluation (Signed)
Physical Therapy Evaluation Patient Details Name: Cassandra Dodson MRN: [id number] DOB: Dec 12, 1940 Today's Date: 03/02/2022  History of Present Illness  Pt is an 81 y.o female presenting to [facility] ED from [place] with increased lethargy and AMS. 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 Pt with recent hospital admission for AMS and was found to have PE 9/22-10/28. PMH significant for pulmonary embolism, frontal meningioma, CKD, CVA, basilar artery stent, and DM.  Clinical Impression   Pt admitted secondary to problem above with deficits below. PTA patient was ambulating "several steps" with assist (per son). Pt currently requires +2 total assist for all bed mobility, primarily due to lethargy (although pt will speak despite keeping eyes closed throughout session). Will initiate trial of therapy to see if pt able to participate and get back to her functional baseline. Patient may benefit from PT to address problems listed below.Will continue to follow acutely to maximize functional mobility independence and safety.          Recommendations for follow up therapy are one component of a multi-disciplinary discharge planning process, led by the attending physician.  Recommendations may be updated based on patient status, additional functional criteria and insurance authorization.  Follow Up Recommendations Skilled nursing-short term rehab (<3 hours/day) Can patient physically be transported by private vehicle: No    Assistance Recommended at Discharge Frequent or constant Supervision/Assistance  Patient can return home with the following  Two people to help with walking and/or transfers;Assistance with cooking/housework;Assistance with feeding;Direct supervision/assist for medications management;Direct supervision/assist for financial management;Assist for transportation;Help with stairs or ramp for entrance    Equipment Recommendations None recommended  by PT  Recommendations for Other Services       Functional Status Assessment Patient has had a recent decline in their functional status and demonstrates the ability to make significant improvements in function in a reasonable and predictable amount of time.     Precautions / Restrictions Precautions Precautions: Fall      Mobility  Bed Mobility Overal bed mobility: Needs Assistance             General bed mobility comments: +2 total to scoot up to [facility], elevated to chair position with pt keeping eyes closed, however with some verbalizations ("What?" "[place]...it's a name")    Transfers                   General transfer comment: unable to attempt due to pt lethargy/lack of participation    Ambulation/Gait                  Stairs            Wheelchair Mobility    Modified Rankin (Stroke Patients Only) Modified Rankin (Stroke Patients Only) Pre-Morbid Rankin Score: Severe disability Modified Rankin: Severe disability     Balance Overall balance assessment: Needs assistance Sitting-balance support: No upper extremity supported Sitting balance-Leahy Scale: Poor Sitting balance - Comments: in chair posiition, leaning to her right and required total assist to correct                                     Pertinent Vitals/Pain Pain Assessment Pain Assessment: Faces Faces Pain Scale: Hurts even more Pain Location: LLE with PROM Pain Descriptors / Indicators: Guarding, Grimacing, Moaning Pain Intervention(s): Limited activity within patient's tolerance, Repositioned    Home Living Family/patient expects  to be discharged to:: Skilled nursing facility                        Prior Function Prior Level of Function : Needs assist             Mobility Comments: per son, was walking a few steps at SNF; if works with therapy hard one day, the next day she will sleep all day       Hand Dominance   Dominant Hand:  Right    Extremity/Trunk Assessment   Upper Extremity Assessment Upper Extremity Assessment: Defer to OT evaluation    Lower Extremity Assessment Lower Extremity Assessment: RLE deficits/detail;LLE deficits/detail RLE Deficits / Details: PROM WFL; grimaces with heelslide' LLE Deficits / Details: PROM WFL, no active assist    Cervical / Trunk Assessment Cervical / Trunk Assessment: Other exceptions Cervical / Trunk Exceptions: overweight  Communication   Communication: HOH;Expressive difficulties (expressive difficulties per son, "sometimes word salad")  Cognition Arousal/Alertness: Lethargic Behavior During Therapy: Flat affect Overall Cognitive Status: Difficult to assess                                 General Comments: did follow commands to squeeze with rt hand and to raise RUE        General Comments General comments (skin integrity, edema, etc.): Son initially present, but left after providing information re: prior functional status    Exercises Other Exercises Other Exercises: PROM bil LEs including ankle DF   Assessment/Plan    PT Assessment Patient needs continued PT services  PT Problem List Decreased strength;Decreased activity tolerance;Decreased balance;Decreased mobility;Decreased cognition;Decreased safety awareness;Decreased knowledge of precautions;Obesity       PT Treatment Interventions DME instruction;Gait training;Functional mobility training;Therapeutic activities;Therapeutic exercise;Balance training;Neuromuscular re-education;Cognitive remediation;Patient/family education    PT Goals (Current goals can be found in the Care Plan section)  Acute Rehab PT Goals Patient Stated Goal: unable to state PT Goal Formulation: Patient unable to participate in goal setting Time For Goal Achievement: 03/16/22 Potential to Achieve Goals: Fair    Frequency Min 2X/week     Co-evaluation PT/OT/SLP Co-Evaluation/Treatment: Yes Reason for  Co-Treatment: For patient/therapist safety PT goals addressed during session: Strengthening/ROM         AM-PAC PT "6 Clicks" Mobility  Outcome Measure Help needed turning from your back to your side while in a flat bed without using bedrails?: Total Help needed moving from lying on your back to sitting on the side of a flat bed without using bedrails?: Total Help needed moving to and from a bed to a chair (including a wheelchair)?: Total Help needed standing up from a chair using your arms (e.g., wheelchair or bedside chair)?: Total Help needed to walk in hospital room?: Total Help needed climbing 3-5 steps with a railing? : Total 6 Click Score: 6    End of Session Equipment Utilized During Treatment: Oxygen Activity Tolerance: Patient limited by lethargy Patient left: in bed;with call bell/phone within reach;with bed alarm set Nurse Communication: Other (comment) (lethargic) PT Visit Diagnosis: Other abnormalities of gait and mobility (R26.89);Muscle weakness (generalized) (M62.81)    Time: 2353-6144 PT Time Calculation (min) (ACUTE ONLY): 22 min   Charges:   PT Evaluation $PT Eval Low Complexity: [place], PT Acute Rehabilitation Services  Office [phone]   Rexanne Mano 03/02/2022, 12:34  PM

## 2022-03-03 DIAGNOSIS — Z86711 Personal history of pulmonary embolism: Secondary | ICD-10-CM | POA: Diagnosis not present

## 2022-03-03 DIAGNOSIS — Z8673 Personal history of transient ischemic attack (TIA), and cerebral infarction without residual deficits: Secondary | ICD-10-CM

## 2022-03-03 DIAGNOSIS — I639 Cerebral infarction, unspecified: Secondary | ICD-10-CM | POA: Diagnosis not present

## 2022-03-03 DIAGNOSIS — D329 Benign neoplasm of meninges, unspecified: Secondary | ICD-10-CM | POA: Diagnosis not present

## 2022-03-03 DIAGNOSIS — D649 Anemia, unspecified: Secondary | ICD-10-CM | POA: Diagnosis not present

## 2022-03-03 DIAGNOSIS — I63531 Cerebral infarction due to unspecified occlusion or stenosis of right posterior cerebral artery: Secondary | ICD-10-CM | POA: Diagnosis not present

## 2022-03-03 NOTE — NC FL2 (Signed)
[place] LEVEL OF CARE SCREENING TOOL     IDENTIFICATION  Patient Name: Cassandra Dodson Birthdate: 1940/11/04 Sex: female Admission Date (Current Location): 03/01/2022  [facility] and [place] Number:  Herbalist and Address:  [facility], [place] [address] [place],  [address]      Provider Number: [id number]  Attending Physician Name and Address:  Samuella Cota, MD  Relative Name and Phone Number:  Senita, Corredor [phone]) [phone]    Current Level of Care: Hospital Recommended Level of Care: [place] Prior Approval Number:    Date Approved/Denied:   PASRR Number: [id number]  Discharge Plan: SNF    Current Diagnoses: Patient Active Problem List   Diagnosis Date Noted   History of CVA with residual deficit 03/01/2022   History of pulmonary embolism 03/01/2022   Chronic anticoagulation 03/01/2022   Meningioma ([place]) 03/01/2022   Malnutrition of moderate degree 02/02/2022   Pulmonary embolism with acute cor pulmonale ([place]) 01/26/2022   AMS (altered mental status) 01/11/2022   Dysphagia 11/28/2021   Anemia 11/28/2021   Fluctuating mental status 11/28/2021   Right pontine cerebrovascular accident ([place]) 11/05/2021   Pressure injury of skin 11/02/2021   Occlusion and stenosis of basilar artery 11/01/2021   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   UTI (urinary tract infection) 10/27/2021   Intracranial mass    Hyperlipidemia    Type 2 diabetes mellitus with stage 3b chronic kidney disease, without long-term current use of insulin (HCC)    CVA (cerebral vascular accident) ([place]) 10/26/2021   Stage 3b chronic kidney disease (HCC)    Thrombocytopenia ([place])    History of endometrial cancer    Brain mass 10/25/2021   Endometrial cancer ([place]) 05/13/2019   Hypertension    Total knee replacement status 06/27/2016   Primary osteoarthritis of left knee 04/30/2016   Osteoarthritis of right hip 02/23/2015   Hip  arthritis 02/23/2015    Orientation RESPIRATION BLADDER Height & Weight     Self  O2 Incontinent, External catheter Weight: 157 lb 6.5 oz (71.4 kg) Height:  '5\' 3"'$  (160 cm)  BEHAVIORAL SYMPTOMS/MOOD NEUROLOGICAL BOWEL NUTRITION STATUS      Incontinent Diet (See DC Summary)  AMBULATORY STATUS COMMUNICATION OF NEEDS Skin   Total Care Verbally Bruising, Skin abrasions                       Personal Care Assistance Level of Assistance  Bathing, Feeding, Dressing, Total care Bathing Assistance: Maximum assistance Feeding assistance: Limited assistance Dressing Assistance: Maximum assistance Total Care Assistance: Maximum assistance   Functional Limitations Info  Sight, Hearing, Speech Sight Info: Impaired Hearing Info: Impaired Speech Info: Adequate    SPECIAL CARE FACTORS FREQUENCY  PT (By licensed PT), OT (By licensed OT)     PT Frequency: 5x a week OT Frequency: 5x a week            Contractures Contractures Info: Not present    Additional Factors Info  Code Status, Allergies, Insulin Sliding Scale Code Status Info: Full Allergies Info: Anesthetics, Amide, Ether   Insulin Sliding Scale Info: Novolog See DC Summary       Current Medications (03/03/2022):  This is the current hospital active medication list Current Facility-Administered Medications  Medication Dose Route Frequency Provider Last Rate Last Admin   0.9 %  sodium chloride infusion   Intravenous Continuous Smith, Rondell A, MD       acetaminophen (TYLENOL) tablet 650  mg  650 mg Oral Q4H PRN Fuller Plan A, MD       Or   acetaminophen (TYLENOL) 160 MG/5ML solution 650 mg  650 mg Per Tube Q4H PRN Fuller Plan A, MD       Or   acetaminophen (TYLENOL) suppository 650 mg  650 mg Rectal Q4H PRN Fuller Plan A, MD       aspirin EC tablet 81 mg  81 mg Oral Daily Tamala Julian, Rondell A, MD   81 mg at 03/02/22 0924   heparin injection 5,000 Units  5,000 Units Subcutaneous Lina Sar, MD   5,000 Units  at 03/03/22 1607   hydrALAZINE (APRESOLINE) injection 10 mg  10 mg Intravenous Q4H PRN Fuller Plan A, MD       rosuvastatin (CRESTOR) tablet 20 mg  20 mg Oral QHS Samuella Cota, MD   20 mg at 03/02/22 2100     Discharge Medications: Please see discharge summary for a list of discharge medications.  Relevant Imaging Results:  Relevant Lab Results:   Additional Information SSN: [id number]. Pt is vaccinated for covid with multiple boosters.  Charne Mcbrien Renold Don, LCSWA

## 2022-03-03 NOTE — Progress Notes (Signed)
  Progress Note   Patient: Cassandra Dodson DOB: January 25, 1941 DOA: 03/01/2022     2 DOS: the patient was seen and examined on 03/03/2022   Brief hospital course: 81 year old woman from [facility] CVA with residual left-sided deficits and dysphagia, and GI, followed by neurosurgery, presented with lethargy and altered mental status.  Admitted for acute CVA.  Assessment and Plan: Acute right PCA stroke with hemorrhagic conversion, early subacute/acute infarct right frontoparietal white matter, likely caused by PCA stenosis, small infarct left cerebellar hemisphere age-indeterminate, PMH CVA with residual left-sided weakness -- Continue management per nephrology, currently with aspirin.  Resume statin.  Anticoagulation will be deferred for several days per neurology. --Complete stroke work-up.  Further recommendations per neurology.   PMH PE on chronic anticoagulation --During hospitalization in October patient required intubation after being found to be hypoxic with saddle PE.   --Holding Eliquis due to acute stroke with hemorrhagic transformation.   Normocytic anemia --Chronic.  Hemoglobin 10.1 g/dL which appears improved from prior. 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 --Continue outpatient follow-up with neurosurgery   Endometrial cancer -Continue outpatient follow-up with gynecologic oncology      Subjective:  Feels ok  Physical Exam: Vitals:   03/03/22 0450 03/03/22 0857 03/03/22 1208 03/03/22 1703  BP: 124/77 (!) 145/75 137/68 (!) 147/105  Pulse: 88 (!) 107 (!) 109 (!) 102  Resp: '18 20 20 18  '$ Temp: 98.8 F (37.1 C) 98.9 F (37.2 C) (!) 97.3 F (36.3 C) 99.3 F (37.4 C)  TempSrc: Axillary Oral Oral Oral  SpO2: (!) 79% 100% 99% 100%  Weight:       Height:       Physical Exam Vitals reviewed.  Constitutional:      General: She is not in acute distress.    Appearance: She is not ill-appearing or toxic-appearing.  Cardiovascular:     Rate and Rhythm: Normal rate and regular rhythm.     Heart sounds: No murmur heard. Pulmonary:     Effort: Pulmonary effort is normal. No respiratory distress.     Breath sounds: No wheezing, rhonchi or rales.  Neurological:     Mental Status: She is alert.     Comments: Chronic left-sided weakness  Psychiatric:        Mood and Affect: Mood normal.        Behavior: Behavior normal.     Data Reviewed: No new labs  Family Communication: none  Disposition: Status is: Inpatient Remains inpatient appropriate because: stroke  Planned Discharge Destination: Skilled nursing facility    Time spent: 20 minutes  Author: Murray Hodgkins, MD 03/03/2022 6:15 PM  For on call review [email or web address].

## 2022-03-03 NOTE — TOC Initial Note (Signed)
Transition of Care [facility]) - Initial/Assessment Note    Patient Details  Name: Cassandra Dodson MRN: [id number] Date of Birth: August 18, 1940  [facility] CM/SW Contact:    Tresa Endo Phone Number: 03/03/2022, 12:11 PM  Clinical Narrative:                 CSW received SNF consult. CSW attempted to visit pt in room to speak with family, no family in room. CSW attempted to contact pt son [name], no answer. CSW contacted pt daughter in law Elmo Putt who stated all decisions has to go through her husband and he is not available until after mass today. She was able to share that pt came in from [place] short term.  Elmo Putt also shared that prior to admission insurance was no longer going to pay for SNF bc pt was getting better. Elmo Putt thinks that pt has been approved for LTC at [facility] but will confirm with her spouse.      PT reports prior to admission pt was taking several steps but now requires 2+assist for all bed mobility. CSW reviewed PT/OT recommendations for SNF.   CSW will continue to follow.    Expected Discharge Plan: Skilled Nursing Facility Barriers to Discharge: Continued Medical Work up   Patient Goals and CMS Choice Patient states their goals for this hospitalization and ongoing recovery are:: SNF for continued Rehab CMS Medicare.gov Compare Post Acute Care list provided to:: Patient Choice offered to / list presented to : Adult Children  Expected Discharge Plan and Services Expected Discharge Plan: [place] In-house Referral: Clinical Social Work   Post Acute Care Choice: [place] Living arrangements for the past 2 months: [place]                                      Prior Living Arrangements/Services Living arrangements for the past 2 months: [place] Lives with:: Facility Resident Patient language and need for interpreter reviewed:: Yes Do you feel safe going back to the  place where you live?: Yes      Need for Family Participation in Patient Care: Yes (Comment) Care giver support system in place?: Yes (comment)   Criminal Activity/Legal Involvement Pertinent to Current Situation/Hospitalization: No - Comment as needed  Activities of Daily Living Home Assistive Devices/Equipment: Wheelchair ADL Screening (condition at time of admission) Patient's cognitive ability adequate to safely complete daily activities?: No Is the patient deaf or have difficulty hearing?: Yes Does the patient have difficulty seeing, even when wearing glasses/contacts?: Yes Does the patient have difficulty concentrating, remembering, or making decisions?: Yes Patient able to express need for assistance with ADLs?: No Does the patient have difficulty dressing or bathing?: Yes Independently performs ADLs?: No Communication: Needs assistance Is this a change from baseline?: Pre-admission baseline Dressing (OT): Needs assistance Is this a change from baseline?: Pre-admission baseline Grooming: Needs assistance Is this a change from baseline?: Pre-admission baseline Feeding: Needs assistance Is this a change from baseline?: Pre-admission baseline Bathing: Needs assistance Is this a change from baseline?: Pre-admission baseline Toileting: Needs assistance Is this a change from baseline?: Pre-admission baseline In/Out Bed: Needs assistance Is this a change from baseline?: Pre-admission baseline Walks in Home: Needs assistance Is this a change from baseline?: Pre-admission baseline Does the patient have difficulty walking or climbing stairs?: Yes Weakness of Legs: Both Weakness of Arms/Hands: Both  Permission Sought/Granted Permission sought to share information with : Facility Sport and exercise psychologist, Family Supports, Case Manager Permission granted to share information with : Yes, Verbal Permission Granted  Share Information with NAME: Larken, Urias (Son)    [phone]  Permission granted to share info w AGENCY: SNF  Permission granted to share info w Relationship: Siana, Panameno (Son)   [phone]  Permission granted to share info w Contact Information: Siana, Panameno)   [phone]  Emotional Assessment Appearance:: Appears stated age Attitude/Demeanor/Rapport: Unable to Assess Affect (typically observed): Unable to Assess Orientation: : Oriented to Self Alcohol / Substance Use: Not Applicable Psych Involvement: No (comment)  Admission diagnosis:  CVA (cerebral vascular accident) [facility]) [I63.9] Cerebrovascular accident (CVA), unspecified mechanism ([place]) [I63.9] Patient Active Problem List   Diagnosis Date Noted   History of CVA with residual deficit 03/01/2022   History of pulmonary embolism 03/01/2022   Chronic anticoagulation 03/01/2022   Meningioma ([place]) 03/01/2022   Malnutrition of moderate degree 02/02/2022   Pulmonary embolism with acute cor pulmonale ([place]) 01/26/2022   AMS (altered mental status) 01/11/2022   Dysphagia 11/28/2021   Anemia 11/28/2021   Fluctuating mental status 11/28/2021   Right pontine cerebrovascular accident ([place]) 11/05/2021   Pressure injury of skin 11/02/2021   Occlusion and stenosis of basilar artery 11/01/2021   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   UTI (urinary tract infection) 10/27/2021   Intracranial mass    Hyperlipidemia    Type 2 diabetes mellitus with stage 3b chronic kidney disease, without long-term current use of insulin ([place])    CVA (cerebral vascular accident) ([place]) 10/26/2021   Stage 3b chronic kidney disease ([place])    Thrombocytopenia ([place])    History of endometrial cancer    Brain mass 10/25/2021   Endometrial cancer ([place]) 05/13/2019   Hypertension    Total knee replacement status 06/27/2016   Primary osteoarthritis of left knee 04/30/2016   Osteoarthritis of right hip 02/23/2015   Hip arthritis 02/23/2015   PCP:  Shon Baton, MD Pharmacy:   [facility] [place], [place] AT [address] [place] [address] Phone: [phone] Fax: [place] [address] Phone: [phone] Fax: [phone]     Social Determinants of Health (SDOH) Interventions Housing Interventions: Patient Refused  Readmission Risk Interventions     No data to display

## 2022-03-03 NOTE — Progress Notes (Signed)
STROKE TEAM PROGRESS NOTE   SUBJECTIVE (INTERVAL HISTORY) No family is at the bedside.  Pt is reclined in bed, eyes open, awake alert, orientated to place and people, however when asking age she said "I am not telling".  When asking about time she said "I do not know".  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  Left upper extremity no DVT.   OBJECTIVE Temp:  [97.3 F (36.3 C)-99.3 F (37.4 C)] 99.3 F (37.4 C) (11/12 1703) Pulse Rate:  [88-109] 102 (11/12 1703) Cardiac Rhythm: Normal sinus rhythm (11/12 0745) Resp:  [16-20] 18 (11/12 1703) BP: (124-151)/(52-105) 147/105 (11/12 1703) SpO2:  [79 %-100 %] 100 % (11/12 1703)  No results for input(s): "GLUCAP" in the last 168 hours. Recent Labs  Lab 03/01/22 1024 03/02/22 0238  NA 133* 131*  K 4.4 3.7  CL 100 101  CO2 23 23  GLUCOSE 130* 215*  BUN 14 15  CREATININE 0.91 0.85  CALCIUM 9.2 8.6*   Recent Labs  Lab 03/01/22 1024  AST 21  ALT 8  ALKPHOS 54  BILITOT 0.9  PROT 5.5*  ALBUMIN 2.6*   Recent Labs  Lab 03/01/22 1024 03/02/22 0238  WBC 6.1 4.8  NEUTROABS 3.8  --   HGB 10.1* 9.4*  HCT 33.0* 30.4*  MCV 95.4 94.7  PLT 169 151   No results for input(s): "CKTOTAL", "CKMB", "CKMBINDEX", "TROPONINI" in the last 168 hours. No results for input(s): "LABPROT", "INR" in the last 72 hours. Recent Labs    03/01/22 1023  COLORURINE YELLOW  LABSPEC 1.019  PHURINE 7.0  GLUCOSEU NEGATIVE  HGBUR NEGATIVE  BILIRUBINUR NEGATIVE  KETONESUR NEGATIVE  PROTEINUR 30*  NITRITE NEGATIVE  LEUKOCYTESUR LARGE*       Component Value Date/Time   CHOL 97 03/02/2022 0238   TRIG 68 03/02/2022 0238   HDL 35 (L) 03/02/2022 0238   CHOLHDL 2.8 03/02/2022 0238   VLDL 14 03/02/2022 0238   LDLCALC 48 03/02/2022 0238   Lab Results  Component Value Date   HGBA1C 5.0 03/02/2022   No results found for: "LABOPIA", "COCAINSCRNUR", "LABBENZ", "AMPHETMU", "THCU", "LABBARB"  No results for input(s): "ETH" in the last  168 hours.  I have personally reviewed the radiological images below and agree with the radiology interpretations.  VAS [place] UPPER EXTREMITY VENOUS DUPLEX  Result Date: 03/02/2022 UPPER VENOUS STUDY  Patient Name:  Cassandra Dodson  Date of Exam:   03/02/2022 Medical Rec #: [id number]     Accession #:    [id number] Date of Birth: [birth year]     Patient Gender: F Patient Age:   51 years Exam Location:  [facility] Procedure:      VAS [place] UPPER EXTREMITY VENOUS DUPLEX Referring Phys: Cornelius Moras Galen Russman --------------------------------------------------------------------------------  Indications: History of PE, asymmetrical left arm swelling Limitations: Patient somnolence, limited ability to reposition, overlying subcutaneous edema, size and depth of vessels. Comparison Study: No prior studies. Performing Technologist: Darlin Coco RDMS, RVT  Examination Guidelines: A complete evaluation includes B-mode imaging, spectral Doppler, color Doppler, and power Doppler as needed of all accessible portions of each vessel. Bilateral testing is considered an integral part of a complete examination. Limited examinations for reoccurring indications may be performed as noted.  Right Findings: +----------+------------+---------+-----------+----------+-------+ RIGHT     CompressiblePhasicitySpontaneousPropertiesSummary +----------+------------+---------+-----------+----------+-------+ Subclavian               Yes       Yes                      +----------+------------+---------+-----------+----------+-------+  Left Findings: +----------+------------+---------+-----------+----------+---------------------+ LEFT      CompressiblePhasicitySpontaneousProperties       Summary        +----------+------------+---------+-----------+----------+---------------------+ IJV           Full       Yes       Yes                                     +----------+------------+---------+-----------+----------+---------------------+ Subclavian               Yes       Yes                                    +----------+------------+---------+-----------+----------+---------------------+ Axillary      Full       Yes       Yes                                    +----------+------------+---------+-----------+----------+---------------------+ Brachial      Full                                    Some segments not                                                          well visualized    +----------+------------+---------+-----------+----------+---------------------+ Radial        Full                                                        +----------+------------+---------+-----------+----------+---------------------+ Ulnar         Full                                                        +----------+------------+---------+-----------+----------+---------------------+ Cephalic      Full                                                        +----------+------------+---------+-----------+----------+---------------------+ Basilic       Full                                                        +----------+------------+---------+-----------+----------+---------------------+  Summary:  Right: No evidence of thrombosis in the subclavian.  Left: No evidence of deep vein thrombosis in the upper extremity. No evidence of superficial vein thrombosis in  the upper extremity.  *See table(s) above for measurements and observations.  Diagnosing physician: Monica Martinez MD Electronically signed by Monica Martinez MD on 03/02/2022 at 4:15:32 PM.    Final    CT ANGIO HEAD NECK W WO CM  Result Date: 03/01/2022 CLINICAL DATA:  Infarcts on same-day MRI, increased lethargy and altered mental status EXAM: CT ANGIOGRAPHY HEAD AND NECK TECHNIQUE: Multidetector CT imaging of the head and neck was performed using the standard  protocol during bolus administration of intravenous contrast. Multiplanar CT image reconstructions and MIPs were obtained to evaluate the vascular anatomy. Carotid stenosis measurements (when applicable) are obtained utilizing NASCET criteria, using the distal internal carotid diameter as the denominator. RADIATION DOSE REDUCTION: This exam was performed according to the departmental dose-optimization program which includes automated exposure control, adjustment of the mA and/or kV according to patient size and/or use of iterative reconstruction technique. CONTRAST:  3m OMNIPAQUE IOHEXOL 350 MG/ML SOLN COMPARISON:  01/16/2022 CTA head; correlation is also made with CT head 03/01/2022 and MRI head 03/01/2022 FINDINGS: CT HEAD FINDINGS Brain: Redemonstrated acute to early subacute right PCA territory infarct in the posterior right temporal and occipital lobes, with petechial hemorrhage. Mild local mass effect with effacement of the sulci and narrowing of the right lateral ventricle atrium and occipital horn. 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 Unchanged associated mass effect and surrounding edema with 1.6 cm left-to-right midline shift at the level of the anterior falx. The punctate infarct in the right frontoparietal white matter is not appreciated on this exam. No evidence of additional acute infarct or hemorrhage. No hydrocephalus or extra-axial fluid collection. Periventricular white matter changes, likely the sequela of chronic small vessel ischemic disease. Vascular: No hyperdense vessel. Atherosclerotic calcifications in the intracranial carotid and vertebral arteries. Basilar artery stent. Skull: Normal. Negative for fracture or focal lesion. Sinuses/Orbits: Clear paranasal sinuses. The orbits are unremarkable. Other:  The mastoid air cells are well aerated. CTA NECK FINDINGS Aortic arch: Two-vessel arch with a common origin of the brachiocephalic and left common carotid arteries. Imaged portion shows no evidence of aneurysm or dissection. No significant stenosis of the major arch vessel origins. Aortic atherosclerosis. Right carotid system: No evidence of dissection, occlusion, or hemodynamically significant stenosis (greater than 50%). Atherosclerotic disease at the bifurcation and in the proximal ICA is not hemodynamically significant. Left carotid system: Approximately 50% stenosis in the proximal left ICA. No evidence of dissection or occlusion. Vertebral arteries: No evidence of dissection, occlusion, or hemodynamically significant stenosis (greater than 50%). Skeleton: No acute osseous abnormality. Other neck: Negative. Upper chest: No focal pulmonary opacity or pleural effusion. Emphysema. Review of the MIP images confirms the above findings CTA HEAD FINDINGS Anterior circulation: Both internal carotid arteries are patent to the termini, with mild stenosis in the bilateral cavernous segments. A1 segments patent, with mild focal stenosis in the proximal right A1 and proximal to mid left A1. Normal anterior communicating artery. Anterior cerebral arteries are patent to their distal aspects. No M1 stenosis or occlusion. MCA branches perfused and symmetric. Posterior circulation: At least moderate stenosis throughout the left V4 segment, with focal near occlusion distally (series 10, image 217). The right vertebral artery is patent to the vertebrobasilar junction. Posterior inferior cerebellar arteries patent proximally. Redemonstrated stent within the distal basilar artery, which limits evaluation for patency.  There is moderate stenosis just proximal to the stent (series 10, image 237). Basilar patent to its distal aspect. Superior cerebellar arteries patent proximally. Multifocal narrowing in the right P1, with additional  more significant moderate stenosis at the right P1/P2 junction (series 10, image 254) and in the mid to distal A2 (series 10, image 248), and severe stenosis in the proximal right P3 (series 10, image 250). The left P1 is not well visualized and may be hypoplastic or stenosed. Fetal or near fetal origin of the left PCA from a prominent posterior communicating artery. The right posterior communicating artery is not visualized. The posterior circulation appears overall similar to the prior exam. Venous sinuses: As permitted by contrast timing, patent. Anatomic variants: Fetal or near fetal origin of the left PCA. Review of the MIP images confirms the above findings IMPRESSION: 1. Redemonstrated acute to early subacute right PCA territory infarct, with petechial hemorrhage and mild local mass effect. 2. Multifocal narrowing in the posterior circulation, with near occlusion in the distal left V4 and right PCA and severe stenosis in the proximal right P3, as well as moderate narrowing in the remainder of the right PCA and left V4. In addition, there is moderate stenosis just proximal to the stent in the distal basilar artery. These stenoses appear unchanged from the prior exam. 3. Mild stenosis in the bilateral cavernous ICAs and proximal right A1 and proximal to mid left. 4. Approximately 50% stenosis in the proximal left ICA. No other hemodynamically significant stenosis in the neck. 5. Large planum sphenoidale meningioma, with associated mass effect and 1.6 cm left-to-right midline shift. 6. Aortic atherosclerosis and emphysema. Aortic Atherosclerosis (ICD10-I70.0) and Emphysema (ICD10-J43.9). Electronically Signed   By: Merilyn Baba M.D.   On: 03/01/2022 19:56   MR BRAIN WO CONTRAST  Result Date: 03/01/2022 CLINICAL DATA:  Provided history: Stroke, follow-up. EXAM: MRI HEAD WITHOUT CONTRAST TECHNIQUE: Multiplanar, multiecho pulse sequences of the brain and surrounding structures were obtained without  intravenous contrast. COMPARISON:  Head CT performed earlier today 03/01/2022. Brain MRI 01/13/2022. CTA head 01/16/2022. FINDINGS: Intermittently motion degraded examination, limiting evaluation. Most notably, the sagittal T1 weighted sequence is severely motion degraded, the axial T2 sequence is severely motion degraded, the axial T1 weighted sequence is severely motion degraded and the coronal T2 sequence is severely motion degraded. Brain: Mild generalized cerebral atrophy. 5.5 x 4.0 cm focus of cortical and subcortical restricted diffusion and heterogeneous signal abnormality within the right temporal and occipital lobes, compatible with an early subacute infarct. Ill-defined hemorrhagic conversion within portions of the infarction territory. Local mass effect with partial effacement of the right lateral ventricle at the level of the anterior falx 3 mm acute/early subacute infarct within the right frontoparietal white matter (for instance as seen on series 2, image 35). Redemonstrated tiny chronic cortical infarct within the high right parietal lobe (series 6, image 19). Chronic infarcts within the right corona radiata/caudate nucleus, left corona radiata, left thalamus, right aspect of the pons and within the bilateral cerebellar hemispheres. Background moderate multifocal T2 FLAIR hyperintensity within the cerebral white matter, nonspecific but compatible with chronic small vessel disease. Chronic small vessel ischemic changes are also present within the bilateral middle cerebellar peduncles. Redemonstrated large planum sphenoidale meningioma, measuring 5.0 x 3.8 cm in transaxial dimensions. As before, there is significant regional mass effect with moderate vasogenic edema in the adjacent anterior left frontal lobe, and 1.5 cm rightward midline shift measured at the anterior falx. No extra-axial fluid collection. Vascular: There is  signal abnormality within the distal intracranial left vertebral artery, and  correlating with the prior CTA head of 01/16/2022, this likely reflects focal vessel occlusion. Basilar artery stent better appreciated on the head CT performed earlier today. Skull and upper cervical spine: Within the limitations of motion degradation, no focal suspicious marrow lesion is identified. Sinuses/Orbits: No mass or acute finding within the imaged orbits. Trace mucosal thickening within the bilateral ethmoid air cells. Other: Trace fluid within the right mastoid air cells. IMPRESSION: Intermittently motion degraded examination, limiting evaluation. 5.5 x 4.0 cm subacute cortical and subcortical right PCA territory infarct with ill-defined hemorrhagic conversion. Resultant local mass effect with partial effacement of the right lateral ventricle. 3 mm acute/early subacute infarct within the right frontoparietal white matter. Redemonstrated large planum sphenoidale meningioma with significant regional mass effect, moderate vasogenic edema in the adjacent anterior left frontal lobe and 1.5 cm rightward midline shift at the level of the anterior falx. Background cerebral atrophy, chronic small vessel ischemic disease and chronic infarcts, as detailed. Probable focal occlusion of the distal V4 left vertebral artery, as was demonstrated on the prior CTA head of 01/16/2022. Electronically Signed   By: Kellie Simmering D.O.   On: 03/01/2022 15:28   DG Chest Portable 1 View  Result Date: 03/01/2022 CLINICAL DATA:  Altered mental status. EXAM: PORTABLE CHEST 1 VIEW COMPARISON:  January 30, 2022. FINDINGS: The heart size and mediastinal contours are within normal limits. Both lungs are clear. The visualized skeletal structures are unremarkable. IMPRESSION: No active disease. Electronically Signed   By: Marijo Conception M.D.   On: 03/01/2022 11:32   CT Head Wo Contrast  Result Date: 03/01/2022 CLINICAL DATA:  Provided history: Mental status change, unknown cause. Increased lethargy. Altered mental status. EXAM:  CT HEAD WITHOUT CONTRAST TECHNIQUE: Contiguous axial images were obtained from the base of the skull through the vertex without intravenous contrast. RADIATION DOSE REDUCTION: This exam was performed according to the departmental dose-optimization program which includes automated exposure control, adjustment of the mA and/or kV according to patient size and/or use of iterative reconstruction technique. COMPARISON:  Prior head CT examinations 01/26/2022 and earlier. Brain MRI 01/14/2019 FINDINGS: Brain: Mild generalized cerebral atrophy. Acute/early subacute appearing cortical and subcortical right PCA territory infarct within portions of the right temporal and occipital lobes, measuring 6.5 x 3.3 cm in transaxial dimensions. Curvilinear foci of hyperdensity within the infarction territory compatible with petechial hemorrhage. Local mass effect with partial effacement of the atrium, occipital horn and temporal horn of the right lateral ventricle. 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 Unchanged from the prior head CT of 01/26/2022, this measures 5.0 x 3.8 cm in transaxial dimensions. Also unchanged, there is regional mass effect, moderate vasogenic edema within the underlying anterior left frontal lobe and 1.5 cm rightward midline shift at the level of the anterior falx. Background moderate chronic small vessel ischemic changes within the cerebral white matter. Chronic small vessel ischemic changes are also present within the pons, bilateral middle cerebellar peduncles and bilateral cerebellar hemispheres. Redemonstrated chronic infarcts within the right corona radiata/caudate nucleus, left corona radiata, left thalamus, within the right aspect of the pons and within the right cerebellar hemisphere. 1.6 x 0.6 cm infarct within the left cerebellar hemisphere, not definitively present on prior examinations and age-indeterminate. No extra-axial fluid collection. Vascular: No hyperdense vessel.  Basilar artery stent. Atherosclerotic calcifications. Skull: No fracture or aggressive osseous lesion. Sinuses/Orbits: No mass or acute finding within the imaged orbits. Small-volume frothy secretions  within a posterior right ethmoid air cell. Small-volume fluid within a posterior left ethmoid air cell. Impression #1 called by telephone at the time of interpretation on 03/01/2022 at 11:29 am to provider ADAM CURATOLO , who verbally acknowledged these results. IMPRESSION: Acute/early subacute appearing cortical and subcortical right PCA territory infarct within the right temporal and occipital lobes. Petechial hemorrhage within the infarction territory. Local mass effect with partial effacement the right lateral ventricle. 1.6 cm infarct within the left cerebellar hemisphere, not definitively present on prior exams and age-indeterminate, possibly acute/subacute. Background cerebral atrophy, chronic small vessel ischemic disease and chronic infarcts as described. 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 Mild bilateral ethmoid sinusitis. Electronically Signed   By: Kellie Simmering D.O.   On: 03/01/2022 11:31   DG Swallowing Func-Speech Pathology  Result Date: 02/04/2022 Table formatting from the original result was not included. Objective Swallowing Evaluation: Type of Study: MBS-Modified Barium Swallow Study  Patient Details Name: Cassandra Dodson MRN: [id number] Date of Birth: 1941/01/06 Today's Date: 02/04/2022 Time: SLP Start Time (ACUTE ONLY): 62 -SLP Stop Time (ACUTE ONLY): 7867 SLP Time Calculation (min) (ACUTE ONLY): 18 min Past Medical History: Past Medical History: Diagnosis Date  Arthritis   CKD (chronic kidney disease), stage III ([place])   patient denies  Depression   Diabetes mellitus without complication ([place])   Difficult intravenous  access   Endometrial cancer ([place])   GERD (gastroesophageal reflux disease)   Headache   History of radiation therapy 11/04/2019-12/01/2019  Endometrial HDR; Dr. Gery Pray  Hypertension   Hypothyroidism   Neuromuscular disorder ([place])   neuropathy in feet  Osteoarthritis   PMB (postmenopausal bleeding)   PONV (postoperative nausea and vomiting)   severe nausea and vomiting after knee replacement 06-2014, did ok with 2018 knee replacement  Urinary frequency   Wears glasses  Past Surgical History: Past Surgical History: Procedure Laterality Date  BREAST SURGERY    cyst removed  CESAREAN SECTION    DILATION AND CURETTAGE OF UTERUS N/A 06/24/2019  Procedure: DILATATION AND CURETTAGE;  Surgeon: Lafonda Mosses, MD;  Location: [place];  Service: Gynecology;  Laterality: N/A;  FRACTURE SURGERY    right knee  INTRAUTERINE DEVICE (IUD) INSERTION N/A 06/24/2019  Procedure: INTRAUTERINE DEVICE (IUD) INSERTION MIRENA;  Surgeon: Lafonda Mosses, MD;  Location: [facility];  Service: Gynecology;  Laterality: N/A;  IR ANGIO INTRA EXTRACRAN SEL COM CAROTID INNOMINATE UNI R MOD SED  11/05/2021  IR ANGIO VERTEBRAL SEL VERTEBRAL UNI R MOD SED  11/05/2021  IR ANGIOGRAM PULMONARY BILATERAL SELECTIVE  01/28/2022  IR ANGIOGRAM SELECTIVE EACH ADDITIONAL VESSEL  01/28/2022  IR ANGIOGRAM SELECTIVE EACH ADDITIONAL VESSEL  01/28/2022  IR CT HEAD LTD  11/01/2021  IR INTRA CRAN STENT  11/01/2021  IR RADIOLOGIST EVAL & MGMT  12/19/2021  IR THROMBECT PRIM MECH INIT (INCLU) MOD SED  01/28/2022  IR THROMBECT PRIM MECH INIT (INCLU) MOD SED  01/28/2022  IR US GUIDE VASC ACCESS RIGHT  11/01/2021  IR US GUIDE VASC ACCESS RIGHT  01/28/2022  IRRIGATION AND DEBRIDEMENT SEBACEOUS CYST    JOINT REPLACEMENT Right   right  LYMPH NODE DISSECTION N/A 09/14/2019  Procedure: LYMPH NODE DISSECTION;  Surgeon: Lafonda Mosses, MD;  Location: [facility];  Service: Gynecology;  Laterality: N/A;  RADIOLOGY WITH ANESTHESIA N/A 11/01/2021   Procedure: ANGIOPLASTY/STENT;  Surgeon: Luanne Bras, MD;  Location: [place];  Service: Radiology;  Laterality: N/A;  RADIOLOGY WITH ANESTHESIA N/A 01/28/2022  Procedure: IR WITH ANESTHESIA;  Surgeon: Radiologist, Medication, MD;  Location: [place];  Service: Radiology;  Laterality: N/A;  ROBOTIC ASSISTED TOTAL HYSTERECTOMY WITH BILATERAL SALPINGO OOPHERECTOMY Bilateral 09/14/2019  Procedure: XI ROBOTIC ASSISTED TOTAL HYSTERECTOMY WITH BILATERAL SALPINGO OOPHORECTOMY;  Surgeon: Lafonda Mosses, MD;  Location: [facility];  Service: Gynecology;  Laterality: Bilateral;  SENTINEL NODE BIOPSY N/A 09/14/2019  Procedure: SENTINEL NODE BIOPSY;  Surgeon: Lafonda Mosses, MD;  Location: [facility];  Service: Gynecology;  Laterality: N/A;  teeth extration    TONSILLECTOMY    TOTAL HIP ARTHROPLASTY Right 02/23/2015  Procedure: RIGHT TOTAL HIP ARTHROPLASTY ANTERIOR APPROACH;  Surgeon: Leandrew Koyanagi, MD;  Location: [place];  Service: Orthopedics;  Laterality: Right;  TOTAL KNEE ARTHROPLASTY Left 06/27/2016  Procedure: LEFT TOTAL KNEE ARTHROPLASTY;  Surgeon: Leandrew Koyanagi, MD;  Location: [place];  Service: Orthopedics;  Laterality: Left; HPI: Cassandra Dodson is a 81 y.o. female with medical history significant for early stage uterine cancer, hypertension, type 2 diabetes, hypothyroidism, GERD, CKD 3B, chronic anxiety/depression, osteoarthritis, CVA and meningioma with left-sided deficit and speech difficulty, who presented [facility] ED due to confusion x 2 days. In the ED, work-up revealed CT head with stable meningioma measuring 5 cm and associated vasogenic edema and regional mass effect.  Underwent IR thrombectomy 10/9. FEES 10/2021 penetration thin and nectar, wet vocal quality throughout, D3/nectar recommended.  Subjective: cooperative but confused  Recommendations for follow up therapy are one component of a multi-disciplinary discharge planning process, led by the attending physician.  Recommendations may be updated based on patient status,  additional functional criteria and insurance authorization. Assessment / Plan / Recommendation   02/04/2022  10:43 AM Clinical Impressions Clinical Impression Pt presents with severe oropharyngeal dysphagia largely decreased timing of laryngeal closure/swallow initiation delay to the level of the valleculae, which did not improve with any consistency. During thin and nectar thick liquid trials bolus material spilled from the valleculae into the laryngeal vestibule prior to the initiation of the swallow. Silent aspiration (PAS 8) noted with thin liquids, and penetration (PAS 3/5) noted with nectar thick liquids prior to the swallow. Residue from thin/nectar thick liquids remained in laryngeal vestibule throughout study. Residue in the valleculae and pyriform sinuses present with trials of nectar and honey thick liquids, which increased in amount and was noted in the lateral channels with puree administration. Prolonged oral transit also present with puree adminsitration. Instances of penetration displayed with honey thick liquids via cup (PAS 2). Throughout study pt did not sense bolus material in airway and was cognitively unable to follow commands to cough/throat clear. Anatomically, pt presents with a cricopharyngeal bar. Recommend pt start honey thick liquids via teaspoon only/puree consistency diet, medications crushed in puree. MD consulted and agreed with diet recommendations given aspiration risk and may consult Palliative care. SLP will follow for diet toleration and education. SLP Visit Diagnosis Dysphagia, oropharyngeal phase (R13.12) Impact on safety and function Severe aspiration risk     02/04/2022  10:43 AM Treatment Recommendations Treatment Recommendations Therapy as outlined in treatment plan below     02/04/2022  10:43 AM Prognosis Prognosis for Safe Diet Advancement Fair Barriers to Reach Goals Severity of deficits;Cognitive deficits   02/04/2022  10:43 AM Diet Recommendations SLP Diet  Recommendations Honey thick liquids;Dysphagia 1 (Puree) solids;Other (Comment) Liquid Administration via Spoon Medication Administration Whole meds with puree Compensations  Slow rate;Small sips/bites;Minimize environmental distractions Postural Changes Seated upright at 90 degrees     02/04/2022  10:43 AM Other Recommendations Oral Care Recommendations Oral care BID Follow Up Recommendations Long-term institutional care without follow-up therapy Assistance recommended at discharge Frequent or constant Supervision/Assistance Functional Status Assessment Patient has had a recent decline in their functional status and demonstrates the ability to make significant improvements in function in a reasonable and predictable amount of time.   02/04/2022  10:43 AM Frequency and Duration  Speech Therapy Frequency (ACUTE ONLY) min 2x/week Treatment Duration 2 weeks     02/04/2022  10:43 AM Oral Phase Oral Phase Impaired Oral - Honey Teaspoon WFL Oral - Honey Cup WFL Oral - Nectar Teaspoon NT Oral - Nectar Cup WFL Oral - Nectar Straw NT Oral - Thin Teaspoon NT Oral - Thin Cup WFL Oral - Thin Straw NT Oral - Puree Delayed oral transit;Decreased bolus cohesion Oral - Mech Soft NT Oral - Regular NT Oral - Multi-Consistency NT Oral - Pill NT    02/04/2022  10:43 AM Pharyngeal Phase Pharyngeal Phase Impaired Pharyngeal- Honey Teaspoon Delayed swallow initiation-vallecula;Reduced pharyngeal peristalsis;Penetration/Aspiration during swallow;Pharyngeal residue - pyriform;Pharyngeal residue - valleculae Pharyngeal Material enters airway, remains ABOVE vocal cords and not ejected out Pharyngeal- Honey Cup Delayed swallow initiation-vallecula;Delayed swallow initiation-pyriform sinuses;Penetration/Aspiration during swallow;Pharyngeal residue - pyriform;Pharyngeal residue - valleculae Pharyngeal Material enters airway, remains ABOVE vocal cords and not ejected out Pharyngeal- Nectar Teaspoon NT Pharyngeal- Nectar Cup Delayed swallow  initiation-vallecula;Penetration/Aspiration before swallow;Pharyngeal residue - pyriform;Pharyngeal residue - valleculae Pharyngeal Material enters airway, CONTACTS cords and not ejected out;Material enters airway, remains ABOVE vocal cords and not ejected out Pharyngeal- Nectar Straw NT Pharyngeal- Thin Teaspoon NT Pharyngeal- Thin Cup Trace aspiration;Penetration/Aspiration before swallow;Delayed swallow initiation-vallecula Pharyngeal Material enters airway, passes BELOW cords without attempt by patient to eject out (silent aspiration) Pharyngeal- Thin Straw NT Pharyngeal- Puree Delayed swallow initiation-vallecula;Pharyngeal residue - valleculae;Pharyngeal residue - pyriform;Lateral channel residue Pharyngeal Material does not enter airway Pharyngeal- Mechanical Soft NT Pharyngeal- Regular NT Pharyngeal- Multi-consistency NT Pharyngeal- Pill NT    02/04/2022  10:43 AM Cervical Esophageal Phase  Cervical Esophageal Phase -- [name] 02/04/2022, 11:43 AM                     DG Abd 1 View  Result Date: 02/03/2022 CLINICAL DATA:  NG tube placement EXAM: ABDOMEN - 1 VIEW COMPARISON:  02/01/2022 FINDINGS: There is interval removal of feeding tube. There is interval placement of NG tube which follows the course of trachea and right main bronchus with its tip in the medial right lower lung fields. NG tube should be removed and repositioned. Cardiac size is within normal limits. Thoracic aorta is tortuous and ectatic. Lung fields are clear of any focal consolidation or pulmonary edema. Small linear density in the medial left lower lung fields may suggest subsegmental atelectasis. There is no pleural effusion or pneumothorax. Tip of right PICC line is seen in the region of junction of inferior vena cava and right atrium. IMPRESSION: Tip of NG tube is noted in the medial right lower lung field suggesting ectopic location in the bronchus. Lung fields are clear of any focal consolidation or pulmonary edema.  Small linear density in medial left lower lung fields suggest subsegmental atelectasis. Tip of right PICC line is noted in the region of junction of inferior vena cava and right atrium. Imaging finding of NG tube to be in bronchus in the medial right lower lung field was  relayed to Dr. Doristine Bosworth by telephone call at 11:56 a.m. on 02/03/2022. Electronically Signed   By: Elmer Picker M.D.   On: 02/03/2022 12:01     PHYSICAL EXAM  Temp:  [97.3 F (36.3 C)-99.3 F (37.4 C)] 99.3 F (37.4 C) (11/12 1703) Pulse Rate:  [88-109] 102 (11/12 1703) Resp:  [16-20] 18 (11/12 1703) BP: (124-151)/(52-105) 147/105 (11/12 1703) SpO2:  [79 %-100 %] 100 % (11/12 1703)  General - Well nourished, well developed, drowsy and sleepy.  Ophthalmologic - fundi not visualized due to noncooperation.  Cardiovascular - Regular rhythm and rate.  Neuro - drowsy sleepy but eyes open with repetitive stimulation, orientated to place month and people, but would not tell me her age or year. No aphasia, paucity of speech but no dysarthria, following all simple commands. Able to name and repeat. No gaze palsy, tracking bilaterally, visual field testing not cooperative but suspect left hemianpia, PERRL. No facial droop. Tongue midline. RUE 4/5 RLE 3/5, but LUE 2/5 with swolloen and LLE mild withdraw to pain. Sensation, coordination not cooperative and gait not tested.   ASSESSMENT/PLAN [name] is a 81 y.o. female with history of hypertension, diabetes, endometrial cancer, PE in 01/2022 on Eliquis, strokes admitted for lethargy and altered mental status. No tPA given due to outside window.    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  Will recommend to resume Eliquis 5 days post stroke (11/14) if neuro stable. Ongoing aggressive stroke risk factor management Therapy recommendations: SNF Disposition: Pending  History of stroke 10/2021 admitted for slurred speech, left-sided weakness.  CT showed large left precentral meningioma.  MRI showed right pontine large infarct.  CT head and neck marked stenosis, multifocal intracranial stenosis including moderate right P1/P2 stenosis.  EF 60 to 65%, LDL 133, A1c 7.0.  Discharged on DAPT and Crestor 20.  Later patient had bilateral stenting on 11/01/2021.  Also plan to follow-up with neurosurgery for possible left meningioma resection. 12/2021 admitted for nausea vomiting and worsening left-sided weakness.  CT no acute finding.  MRI showed new right pontine punctate infarct.  CTA head and neck showed left V4 occlusion, right P1/P2 moderate to severe stenosis, however basilar artery stent patent.  EF 60 to 65%.  Discharged on DAPT and Crestor 20 and fenofibrate 160.  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  Was intubated and status post thrombectomy.  Anemia received blood transfusion.  Started heparin IV with thrombocytopenia.  HIT antibody negative.  Eventually heparin IV transition to Eliquis on discharge.  Aspirin and Plavix will discontinue at the time. Per son, patient compliant with Eliquis at home Recommend to resume Eliquis 5 days post stroke around 11/14 if neuro stable  Diabetes HgbA1c 5.0 goal < 7.0 Controlled CBG monitoring SSI DM education and close PCP follow up  Hypertension Stable Long term BP goal  normotensive  Hyperlipidemia Home meds: Crestor 20 LDL 48, goal < 70 Now on Crestor 20 Continue  statin at discharge  Other Stroke Risk Factors Advanced age  Other Active Problems Left upper extremity swollen after PE last months per son, venous Doppler no DVT. Endometrial cancer  Hospital day # 2     Rosalin Hawking, MD PhD Stroke Neurology 03/03/2022 6:38 PM    To contact Stroke Continuity provider, please refer to [email or web address] After hours, contact General Neurology

## 2022-03-04 DIAGNOSIS — E1165 Type 2 diabetes mellitus with hyperglycemia: Secondary | ICD-10-CM

## 2022-03-04 DIAGNOSIS — I482 Chronic atrial fibrillation, unspecified: Secondary | ICD-10-CM

## 2022-03-04 LAB — URINE CULTURE: Culture: >=100000 — AB

## 2022-03-04 LAB — GLUCOSE, CAPILLARY: Glucose-Capillary: 146 mg/dL — ABNORMAL HIGH (ref 70–99)

## 2022-03-04 MED ORDER — INSULIN ASPART 100 UNIT/ML IJ SOLN
0.0000 [IU] | Freq: Three times a day (TID) | INTRAMUSCULAR | Status: DC
  Administered 2022-03-05 – 2022-03-06 (×4): 1 [IU] via SUBCUTANEOUS

## 2022-03-04 MED ORDER — INSULIN ASPART 100 UNIT/ML IJ SOLN: 0.0000 [IU] | Freq: Every day | INTRAMUSCULAR | Status: DC

## 2022-03-04 NOTE — Progress Notes (Signed)
STROKE TEAM PROGRESS NOTE   SUBJECTIVE (INTERVAL HISTORY) No family is at the bedside.  Pt is sleeping in bed, hard to arouse. She worked with OT and speech earlier today. Recommend SNF and MBS.    OBJECTIVE Temp:  [98.2 F (36.8 C)-100.3 F (37.9 C)] 98.2 F (36.8 C) (11/13 0928) Pulse Rate:  [89-109] 89 (11/13 0928) Cardiac Rhythm: Normal sinus rhythm (11/13 0746) Resp:  [16-22] 17 (11/13 0928) BP: (87-147)/(61-105) 126/81 (11/13 0928) SpO2:  [91 %-100 %] 91 % (11/13 0928)  No results for input(s): "GLUCAP" in the last 168 hours. Recent Labs  Lab 03/01/22 1024 03/02/22 0238  NA 133* 131*  K 4.4 3.7  CL 100 101  CO2 23 23  GLUCOSE 130* 215*  BUN 14 15  CREATININE 0.91 0.85  CALCIUM 9.2 8.6*   Recent Labs  Lab 03/01/22 1024  AST 21  ALT 8  ALKPHOS 54  BILITOT 0.9  PROT 5.5*  ALBUMIN 2.6*   Recent Labs  Lab 03/01/22 1024 03/02/22 0238  WBC 6.1 4.8  NEUTROABS 3.8  --   HGB 10.1* 9.4*  HCT 33.0* 30.4*  MCV 95.4 94.7  PLT 169 151   No results for input(s): "CKTOTAL", "CKMB", "CKMBINDEX", "TROPONINI" in the last 168 hours. No results for input(s): "LABPROT", "INR" in the last 72 hours. No results for input(s): "COLORURINE", "LABSPEC", "PHURINE", "GLUCOSEU", "HGBUR", "BILIRUBINUR", "KETONESUR", "PROTEINUR", "UROBILINOGEN", "NITRITE", "LEUKOCYTESUR" in the last 72 hours.  Invalid input(s): "APPERANCEUR"      Component Value Date/Time   CHOL 97 03/02/2022 0238   TRIG 68 03/02/2022 0238   HDL 35 (L) 03/02/2022 0238   CHOLHDL 2.8 03/02/2022 0238   VLDL 14 03/02/2022 0238   LDLCALC 48 03/02/2022 0238   Lab Results  Component Value Date   HGBA1C 5.0 03/02/2022   No results found for: "LABOPIA", "COCAINSCRNUR", "LABBENZ", "AMPHETMU", "THCU", "LABBARB"  No results for input(s): "ETH" in the last 168 hours.  I have personally reviewed the radiological images below and agree with the radiology interpretations.  VAS [place] UPPER EXTREMITY VENOUS DUPLEX  Result  Date: 03/02/2022 UPPER VENOUS STUDY  Patient Name:  TERESITA FANTON  Date of Exam:   03/02/2022 Medical Rec #: [id number]     Accession #:    [id number] Date of Birth: 02-Jan-1941     Patient Gender: F Patient Age:   81 years Exam Location:  [facility] Procedure:      VAS [place] UPPER EXTREMITY VENOUS DUPLEX Referring Phys: Cornelius Moras Emireth Cockerham --------------------------------------------------------------------------------  Indications: History of PE, asymmetrical left arm swelling Limitations: Patient somnolence, limited ability to reposition, overlying subcutaneous edema, size and depth of vessels. Comparison Study: No prior studies. Performing Technologist: Darlin Coco RDMS, RVT  Examination Guidelines: A complete evaluation includes B-mode imaging, spectral Doppler, color Doppler, and power Doppler as needed of all accessible portions of each vessel. Bilateral testing is considered an integral part of a complete examination. Limited examinations for reoccurring indications may be performed as noted.  Right Findings: +----------+------------+---------+-----------+----------+-------+ RIGHT     CompressiblePhasicitySpontaneousPropertiesSummary +----------+------------+---------+-----------+----------+-------+ Subclavian               Yes       Yes                      +----------+------------+---------+-----------+----------+-------+  Left Findings: +----------+------------+---------+-----------+----------+---------------------+ LEFT      CompressiblePhasicitySpontaneousProperties       Summary        +----------+------------+---------+-----------+----------+---------------------+ IJV  Full       Yes       Yes                                    +----------+------------+---------+-----------+----------+---------------------+ Subclavian               Yes       Yes                                    +----------+------------+---------+-----------+----------+---------------------+  Axillary      Full       Yes       Yes                                    +----------+------------+---------+-----------+----------+---------------------+ Brachial      Full                                    Some segments not                                                          well visualized    +----------+------------+---------+-----------+----------+---------------------+ Radial        Full                                                        +----------+------------+---------+-----------+----------+---------------------+ Ulnar         Full                                                        +----------+------------+---------+-----------+----------+---------------------+ Cephalic      Full                                                        +----------+------------+---------+-----------+----------+---------------------+ Basilic       Full                                                        +----------+------------+---------+-----------+----------+---------------------+  Summary:  Right: No evidence of thrombosis in the subclavian.  Left: No evidence of deep vein thrombosis in the upper extremity. No evidence of superficial vein thrombosis in the upper extremity.  *See table(s) above for measurements and observations.  Diagnosing physician: Monica Martinez MD Electronically signed by Monica Martinez MD on 03/02/2022 at 4:15:32 PM.    Final    CT  ANGIO HEAD NECK W WO CM  Result Date: 03/01/2022 CLINICAL DATA:  Infarcts on same-day MRI, increased lethargy and altered mental status EXAM: CT ANGIOGRAPHY HEAD AND NECK TECHNIQUE: Multidetector CT imaging of the head and neck was performed using the standard protocol during bolus administration of intravenous contrast. Multiplanar CT image reconstructions and MIPs were obtained to evaluate the vascular anatomy. Carotid stenosis measurements (when applicable) are obtained utilizing NASCET criteria,  using the distal internal carotid diameter as the denominator. RADIATION DOSE REDUCTION: This exam was performed according to the departmental dose-optimization program which includes automated exposure control, adjustment of the mA and/or kV according to patient size and/or use of iterative reconstruction technique. CONTRAST:  3m OMNIPAQUE IOHEXOL 350 MG/ML SOLN COMPARISON:  01/16/2022 CTA head; correlation is also made with CT head 03/01/2022 and MRI head 03/01/2022 FINDINGS: CT HEAD FINDINGS Brain: Redemonstrated acute to early subacute right PCA territory infarct in the posterior right temporal and occipital lobes, with petechial hemorrhage. Mild local mass effect with effacement of the sulci and narrowing of the right lateral ventricle atrium and occipital horn. 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 Unchanged associated mass effect and surrounding edema with 1.6 cm left-to-right midline shift at the level of the anterior falx. The punctate infarct in the right frontoparietal white matter is not appreciated on this exam. No evidence of additional acute infarct or hemorrhage. No hydrocephalus or extra-axial fluid collection. Periventricular white matter changes, likely the sequela of chronic small vessel ischemic disease. Vascular: No hyperdense vessel. Atherosclerotic calcifications in the intracranial carotid and vertebral arteries. Basilar artery stent. Skull: Normal. Negative for fracture or focal lesion. Sinuses/Orbits: Clear paranasal sinuses. The orbits are unremarkable. Other: The mastoid air cells are well aerated. CTA NECK FINDINGS Aortic arch: Two-vessel arch with a common origin of the brachiocephalic and left common carotid arteries. Imaged portion shows no evidence of aneurysm or dissection. No significant  stenosis of the major arch vessel origins. Aortic atherosclerosis. Right carotid system: No evidence of dissection, occlusion, or hemodynamically significant stenosis (greater than 50%). Atherosclerotic disease at the bifurcation and in the proximal ICA is not hemodynamically significant. Left carotid system: Approximately 50% stenosis in the proximal left ICA. No evidence of dissection or occlusion. Vertebral arteries: No evidence of dissection, occlusion, or hemodynamically significant stenosis (greater than 50%). Skeleton: No acute osseous abnormality. Other neck: Negative. Upper chest: No focal pulmonary opacity or pleural effusion. Emphysema. Review of the MIP images confirms the above findings CTA HEAD FINDINGS Anterior circulation: Both internal carotid arteries are patent to the termini, with mild stenosis in the bilateral cavernous segments. A1 segments patent, with mild focal stenosis in the proximal right A1 and proximal to mid left A1. Normal anterior communicating artery. Anterior cerebral arteries are patent to their distal aspects. No M1 stenosis or occlusion. MCA branches perfused and symmetric. Posterior circulation: At least moderate stenosis throughout the left V4 segment, with focal near occlusion distally (series 10, image 217). The right vertebral artery is patent to the vertebrobasilar junction. Posterior inferior cerebellar arteries patent proximally. Redemonstrated stent within the distal basilar artery, which limits evaluation for patency. There is moderate stenosis just proximal to the stent (series 10, image 237). Basilar patent to its distal aspect. Superior cerebellar arteries patent proximally. Multifocal narrowing in the right P1, with additional more significant moderate stenosis  at the right P1/P2 junction (series 10, image 254) and in the mid to distal A2 (series 10, image 248), and severe stenosis in the proximal right P3 (series 10, image 250). The left P1 is not well visualized  and may be hypoplastic or stenosed. Fetal or near fetal origin of the left PCA from a prominent posterior communicating artery. The right posterior communicating artery is not visualized. The posterior circulation appears overall similar to the prior exam. Venous sinuses: As permitted by contrast timing, patent. Anatomic variants: Fetal or near fetal origin of the left PCA. Review of the MIP images confirms the above findings IMPRESSION: 1. Redemonstrated acute to early subacute right PCA territory infarct, with petechial hemorrhage and mild local mass effect. 2. Multifocal narrowing in the posterior circulation, with near occlusion in the distal left V4 and right PCA and severe stenosis in the proximal right P3, as well as moderate narrowing in the remainder of the right PCA and left V4. In addition, there is moderate stenosis just proximal to the stent in the distal basilar artery. These stenoses appear unchanged from the prior exam. 3. Mild stenosis in the bilateral cavernous ICAs and proximal right A1 and proximal to mid left. 4. Approximately 50% stenosis in the proximal left ICA. No other hemodynamically significant stenosis in the neck. 5. Large planum sphenoidale meningioma, with associated mass effect and 1.6 cm left-to-right midline shift. 6. Aortic atherosclerosis and emphysema. Aortic Atherosclerosis (ICD10-I70.0) and Emphysema (ICD10-J43.9). Electronically Signed   By: Merilyn Baba M.D.   On: 03/01/2022 19:56   MR BRAIN WO CONTRAST  Result Date: 03/01/2022 CLINICAL DATA:  Provided history: Stroke, follow-up. EXAM: MRI HEAD WITHOUT CONTRAST TECHNIQUE: Multiplanar, multiecho pulse sequences of the brain and surrounding structures were obtained without intravenous contrast. COMPARISON:  Head CT performed earlier today 03/01/2022. Brain MRI 01/13/2022. CTA head 01/16/2022. FINDINGS: Intermittently motion degraded examination, limiting evaluation. Most notably, the sagittal T1 weighted sequence is  severely motion degraded, the axial T2 sequence is severely motion degraded, the axial T1 weighted sequence is severely motion degraded and the coronal T2 sequence is severely motion degraded. Brain: Mild generalized cerebral atrophy. 5.5 x 4.0 cm focus of cortical and subcortical restricted diffusion and heterogeneous signal abnormality within the right temporal and occipital lobes, compatible with an early subacute infarct. Ill-defined hemorrhagic conversion within portions of the infarction territory. Local mass effect with partial effacement of the right lateral ventricle at the level of the anterior falx 3 mm acute/early subacute infarct within the right frontoparietal white matter (for instance as seen on series 2, image 35). Redemonstrated tiny chronic cortical infarct within the high right parietal lobe (series 6, image 19). Chronic infarcts within the right corona radiata/caudate nucleus, left corona radiata, left thalamus, right aspect of the pons and within the bilateral cerebellar hemispheres. Background moderate multifocal T2 FLAIR hyperintensity within the cerebral white matter, nonspecific but compatible with chronic small vessel disease. Chronic small vessel ischemic changes are also present within the bilateral middle cerebellar peduncles. Redemonstrated large planum sphenoidale meningioma, measuring 5.0 x 3.8 cm in transaxial dimensions. As before, there is significant regional mass effect with moderate vasogenic edema in the adjacent anterior left frontal lobe, and 1.5 cm rightward midline shift measured at the anterior falx. No extra-axial fluid collection. Vascular: There is signal abnormality within the distal intracranial left vertebral artery, and correlating with the prior CTA head of 01/16/2022, this likely reflects focal vessel occlusion. Basilar artery stent better appreciated on the head CT performed earlier today.  Skull and upper cervical spine: Within the limitations of motion  degradation, no focal suspicious marrow lesion is identified. Sinuses/Orbits: No mass or acute finding within the imaged orbits. Trace mucosal thickening within the bilateral ethmoid air cells. Other: Trace fluid within the right mastoid air cells. IMPRESSION: Intermittently motion degraded examination, limiting evaluation. 5.5 x 4.0 cm subacute cortical and subcortical right PCA territory infarct with ill-defined hemorrhagic conversion. Resultant local mass effect with partial effacement of the right lateral ventricle. 3 mm acute/early subacute infarct within the right frontoparietal white matter. Redemonstrated large planum sphenoidale meningioma with significant regional mass effect, moderate vasogenic edema in the adjacent anterior left frontal lobe and 1.5 cm rightward midline shift at the level of the anterior falx. Background cerebral atrophy, chronic small vessel ischemic disease and chronic infarcts, as detailed. Probable focal occlusion of the distal V4 left vertebral artery, as was demonstrated on the prior CTA head of 01/16/2022. Electronically Signed   By: Kellie Simmering D.O.   On: 03/01/2022 15:28   DG Chest Portable 1 View  Result Date: 03/01/2022 CLINICAL DATA:  Altered mental status. EXAM: PORTABLE CHEST 1 VIEW COMPARISON:  January 30, 2022. FINDINGS: The heart size and mediastinal contours are within normal limits. Both lungs are clear. The visualized skeletal structures are unremarkable. IMPRESSION: No active disease. Electronically Signed   By: Marijo Conception M.D.   On: 03/01/2022 11:32   CT Head Wo Contrast  Result Date: 03/01/2022 CLINICAL DATA:  Provided history: Mental status change, unknown cause. Increased lethargy. Altered mental status. EXAM: CT HEAD WITHOUT CONTRAST TECHNIQUE: Contiguous axial images were obtained from the base of the skull through the vertex without intravenous contrast. RADIATION DOSE REDUCTION: This exam was performed according to the departmental  dose-optimization program which includes automated exposure control, adjustment of the mA and/or kV according to patient size and/or use of iterative reconstruction technique. COMPARISON:  Prior head CT examinations 01/26/2022 and earlier. Brain MRI 01/14/2019 FINDINGS: Brain: Mild generalized cerebral atrophy. Acute/early subacute appearing cortical and subcortical right PCA territory infarct within portions of the right temporal and occipital lobes, measuring 6.5 x 3.3 cm in transaxial dimensions. Curvilinear foci of hyperdensity within the infarction territory compatible with petechial hemorrhage. Local mass effect with partial effacement of the atrium, occipital horn and temporal horn of the right lateral ventricle. 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 Unchanged from the prior head CT of 01/26/2022, this measures 5.0 x 3.8 cm in transaxial dimensions. Also unchanged, there is regional mass effect, moderate vasogenic edema within the underlying anterior left frontal lobe and 1.5 cm rightward midline shift at the level of the anterior falx. Background moderate chronic small vessel ischemic changes within the cerebral white matter. Chronic small vessel ischemic changes are also present within the pons, bilateral middle cerebellar peduncles and bilateral cerebellar hemispheres. Redemonstrated chronic infarcts within the right corona radiata/caudate nucleus, left corona radiata, left thalamus, within the right aspect of the pons and within the right cerebellar hemisphere. 1.6 x 0.6 cm infarct within the left cerebellar hemisphere, not definitively present on prior examinations and age-indeterminate. No extra-axial fluid collection. Vascular: No hyperdense vessel. Basilar artery stent. Atherosclerotic calcifications. Skull: No fracture or aggressive osseous lesion. Sinuses/Orbits: No mass or acute finding within the imaged orbits. Small-volume frothy secretions within a posterior right ethmoid  air cell. Small-volume fluid within a posterior left ethmoid air cell. Impression #1 called by telephone at the time of interpretation on 03/01/2022 at 11:29 am to provider ADAM CURATOLO ,  who verbally acknowledged these results. IMPRESSION: Acute/early subacute appearing cortical and subcortical right PCA territory infarct within the right temporal and occipital lobes. Petechial hemorrhage within the infarction territory. Local mass effect with partial effacement the right lateral ventricle. 1.6 cm infarct within the left cerebellar hemisphere, not definitively present on prior exams and age-indeterminate, possibly acute/subacute. Background cerebral atrophy, chronic small vessel ischemic disease and chronic infarcts as described. 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 Mild bilateral ethmoid sinusitis. Electronically Signed   By: Kellie Simmering D.O.   On: 03/01/2022 11:31   DG Swallowing Func-Speech Pathology  Result Date: 02/04/2022 Table formatting from the original result was not included. Objective Swallowing Evaluation: Type of Study: MBS-Modified Barium Swallow Study  Patient Details Name: BRINLY MAIETTA MRN: [id number] Date of Birth: [birth year] Today's Date: 02/04/2022 Time: SLP Start Time (ACUTE ONLY): 97 -SLP Stop Time (ACUTE ONLY): 6222 SLP Time Calculation (min) (ACUTE ONLY): 18 min Past Medical History: Past Medical History: Diagnosis Date  Arthritis   CKD (chronic kidney disease), stage III ([place])   patient denies  Depression   Diabetes mellitus without complication ([place])   Difficult intravenous access   Endometrial cancer ([place])   GERD (gastroesophageal reflux disease)   Headache   History of radiation therapy 11/04/2019-12/01/2019  Endometrial HDR; Dr. Gery Pray  Hypertension   Hypothyroidism   Neuromuscular disorder ([place])    neuropathy in feet  Osteoarthritis   PMB (postmenopausal bleeding)   PONV (postoperative nausea and vomiting)   severe nausea and vomiting after knee replacement 06-2014, did ok with 2018 knee replacement  Urinary frequency   Wears glasses  Past Surgical History: Past Surgical History: Procedure Laterality Date  BREAST SURGERY    cyst removed  CESAREAN SECTION    DILATION AND CURETTAGE OF UTERUS N/A 06/24/2019  Procedure: DILATATION AND CURETTAGE;  Surgeon: Lafonda Mosses, MD;  Location: [facility];  Service: Gynecology;  Laterality: N/A;  FRACTURE SURGERY    right knee  INTRAUTERINE DEVICE (IUD) INSERTION N/A 06/24/2019  Procedure: INTRAUTERINE DEVICE (IUD) INSERTION MIRENA;  Surgeon: Lafonda Mosses, MD;  Location: [facility];  Service: Gynecology;  Laterality: N/A;  IR ANGIO INTRA EXTRACRAN SEL COM CAROTID INNOMINATE UNI R MOD SED  11/05/2021  IR ANGIO VERTEBRAL SEL VERTEBRAL UNI R MOD SED  11/05/2021  IR ANGIOGRAM PULMONARY BILATERAL SELECTIVE  01/28/2022  IR ANGIOGRAM SELECTIVE EACH ADDITIONAL VESSEL  01/28/2022  IR ANGIOGRAM SELECTIVE EACH ADDITIONAL VESSEL  01/28/2022  IR CT HEAD LTD  11/01/2021  IR INTRA CRAN STENT  11/01/2021  IR RADIOLOGIST EVAL & MGMT  12/19/2021  IR THROMBECT PRIM MECH INIT (INCLU) MOD SED  01/28/2022  IR THROMBECT PRIM MECH INIT (INCLU) MOD SED  01/28/2022  IR US GUIDE VASC ACCESS RIGHT  11/01/2021  IR US GUIDE VASC ACCESS RIGHT  01/28/2022  IRRIGATION AND DEBRIDEMENT SEBACEOUS CYST    JOINT REPLACEMENT Right   right  LYMPH NODE DISSECTION N/A 09/14/2019  Procedure: LYMPH NODE DISSECTION;  Surgeon: Lafonda Mosses, MD;  Location: [facility];  Service: Gynecology;  Laterality: N/A;  RADIOLOGY WITH ANESTHESIA N/A 11/01/2021  Procedure: ANGIOPLASTY/STENT;  Surgeon: Luanne Bras, MD;  Location: [facility];  Service: Radiology;  Laterality: N/A;  RADIOLOGY WITH ANESTHESIA N/A 01/28/2022  Procedure: IR WITH ANESTHESIA;  Surgeon: Radiologist, Medication, MD;  Location:  [facility];  Service: Radiology;  Laterality: N/A;  ROBOTIC ASSISTED TOTAL HYSTERECTOMY WITH BILATERAL SALPINGO OOPHERECTOMY Bilateral 09/14/2019  Procedure: XI ROBOTIC ASSISTED TOTAL HYSTERECTOMY WITH BILATERAL SALPINGO OOPHORECTOMY;  Surgeon: Lafonda Mosses, MD;  Location: [facility];  Service: Gynecology;  Laterality: Bilateral;  SENTINEL NODE BIOPSY N/A 09/14/2019  Procedure: SENTINEL NODE BIOPSY;  Surgeon: Lafonda Mosses, MD;  Location: [facility];  Service: Gynecology;  Laterality: N/A;  teeth extration    TONSILLECTOMY    TOTAL HIP ARTHROPLASTY Right 02/23/2015  Procedure: RIGHT TOTAL HIP ARTHROPLASTY ANTERIOR APPROACH;  Surgeon: Leandrew Koyanagi, MD;  Location: [facility];  Service: Orthopedics;  Laterality: Right;  TOTAL KNEE ARTHROPLASTY Left 06/27/2016  Procedure: LEFT TOTAL KNEE ARTHROPLASTY;  Surgeon: Leandrew Koyanagi, MD;  Location: [facility];  Service: Orthopedics;  Laterality: Left; HPI: LULLA LINVILLE is a 81 y.o. 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 In the ED, work-up revealed CT head with stable meningioma measuring 5 cm and associated vasogenic edema and regional mass effect.  Underwent IR thrombectomy 10/9. FEES 10/2021 penetration thin and nectar, wet vocal quality throughout, D3/nectar recommended.  Subjective: cooperative but confused  Recommendations for follow up therapy are one component of a multi-disciplinary discharge planning process, led by the attending physician.  Recommendations may be updated based on patient status, additional functional criteria and insurance authorization. Assessment / Plan / Recommendation   02/04/2022  10:43 AM Clinical Impressions Clinical Impression Pt presents with severe oropharyngeal dysphagia largely decreased timing of  laryngeal closure/swallow initiation delay to the level of the valleculae, which did not improve with any consistency. During thin and nectar thick liquid trials bolus material spilled from the valleculae into the laryngeal vestibule prior to the initiation of the swallow. Silent aspiration (PAS 8) noted with thin liquids, and penetration (PAS 3/5) noted with nectar thick liquids prior to the swallow. Residue from thin/nectar thick liquids remained in laryngeal vestibule throughout study. Residue in the valleculae and pyriform sinuses present with trials of nectar and honey thick liquids, which increased in amount and was noted in the lateral channels with puree administration. Prolonged oral transit also present with puree adminsitration. Instances of penetration displayed with honey thick liquids via cup (PAS 2). Throughout study pt did not sense bolus material in airway and was cognitively unable to follow commands to cough/throat clear. Anatomically, pt presents with a cricopharyngeal bar. Recommend pt start honey thick liquids via teaspoon only/puree consistency diet, medications crushed in puree. MD consulted and agreed with diet recommendations given aspiration risk and may consult Palliative care. SLP will follow for diet toleration and education. SLP Visit Diagnosis Dysphagia, oropharyngeal phase (R13.12) Impact on safety and function Severe aspiration risk     02/04/2022  10:43 AM Treatment Recommendations Treatment Recommendations Therapy as outlined in treatment plan below     02/04/2022  10:43 AM Prognosis Prognosis for Safe Diet Advancement Fair Barriers to Reach Goals Severity of deficits;Cognitive deficits   02/04/2022  10:43 AM Diet Recommendations SLP Diet Recommendations Honey thick liquids;Dysphagia 1 (Puree) solids;Other (Comment) Liquid Administration via Spoon Medication Administration Whole meds with puree Compensations Slow rate;Small sips/bites;Minimize environmental distractions Postural  Changes Seated upright at 90 degrees     02/04/2022  10:43 AM Other Recommendations Oral Care Recommendations Oral care BID Follow Up Recommendations Long-term institutional care without  follow-up therapy Assistance recommended at discharge Frequent or constant Supervision/Assistance Functional Status Assessment Patient has had a recent decline in their functional status and demonstrates the ability to make significant improvements in function in a reasonable and predictable amount of time.   02/04/2022  10:43 AM Frequency and Duration  Speech Therapy Frequency (ACUTE ONLY) min 2x/week Treatment Duration 2 weeks     02/04/2022  10:43 AM Oral Phase Oral Phase Impaired Oral - Honey Teaspoon WFL Oral - Honey Cup WFL Oral - Nectar Teaspoon NT Oral - Nectar Cup WFL Oral - Nectar Straw NT Oral - Thin Teaspoon NT Oral - Thin Cup WFL Oral - Thin Straw NT Oral - Puree Delayed oral transit;Decreased bolus cohesion Oral - Mech Soft NT Oral - Regular NT Oral - Multi-Consistency NT Oral - Pill NT    02/04/2022  10:43 AM Pharyngeal Phase Pharyngeal Phase Impaired Pharyngeal- Honey Teaspoon Delayed swallow initiation-vallecula;Reduced pharyngeal peristalsis;Penetration/Aspiration during swallow;Pharyngeal residue - pyriform;Pharyngeal residue - valleculae Pharyngeal Material enters airway, remains ABOVE vocal cords and not ejected out Pharyngeal- Honey Cup Delayed swallow initiation-vallecula;Delayed swallow initiation-pyriform sinuses;Penetration/Aspiration during swallow;Pharyngeal residue - pyriform;Pharyngeal residue - valleculae Pharyngeal Material enters airway, remains ABOVE vocal cords and not ejected out Pharyngeal- Nectar Teaspoon NT Pharyngeal- Nectar Cup Delayed swallow initiation-vallecula;Penetration/Aspiration before swallow;Pharyngeal residue - pyriform;Pharyngeal residue - valleculae Pharyngeal Material enters airway, CONTACTS cords and not ejected out;Material enters airway, remains ABOVE vocal cords and not  ejected out Pharyngeal- Nectar Straw NT Pharyngeal- Thin Teaspoon NT Pharyngeal- Thin Cup Trace aspiration;Penetration/Aspiration before swallow;Delayed swallow initiation-vallecula Pharyngeal Material enters airway, passes BELOW cords without attempt by patient to eject out (silent aspiration) Pharyngeal- Thin Straw NT Pharyngeal- Puree Delayed swallow initiation-vallecula;Pharyngeal residue - valleculae;Pharyngeal residue - pyriform;Lateral channel residue Pharyngeal Material does not enter airway Pharyngeal- Mechanical Soft NT Pharyngeal- Regular NT Pharyngeal- Multi-consistency NT Pharyngeal- Pill NT    02/04/2022  10:43 AM Cervical Esophageal Phase  Cervical Esophageal Phase -- [name] 02/04/2022, 11:43 AM                     DG Abd 1 View  Result Date: 02/03/2022 CLINICAL DATA:  NG tube placement EXAM: ABDOMEN - 1 VIEW COMPARISON:  02/01/2022 FINDINGS: There is interval removal of feeding tube. There is interval placement of NG tube which follows the course of trachea and right main bronchus with its tip in the medial right lower lung fields. NG tube should be removed and repositioned. Cardiac size is within normal limits. Thoracic aorta is tortuous and ectatic. Lung fields are clear of any focal consolidation or pulmonary edema. Small linear density in the medial left lower lung fields may suggest subsegmental atelectasis. There is no pleural effusion or pneumothorax. Tip of right PICC line is seen in the region of junction of inferior vena cava and right atrium. IMPRESSION: Tip of NG tube is noted in the medial right lower lung field suggesting ectopic location in the bronchus. Lung fields are clear of any focal consolidation or pulmonary edema. Small linear density in medial left lower lung fields suggest subsegmental atelectasis. Tip of right PICC line is noted in the region of junction of inferior vena cava and right atrium. Imaging finding of NG tube to be in bronchus in the medial  right lower lung field was relayed to Dr. Doristine Bosworth by telephone call at 11:56 a.m. on 02/03/2022. Electronically Signed   By: Elmer Picker M.D.   On: 02/03/2022 12:01     PHYSICAL EXAM  Temp:  [  98.2 F (36.8 C)-100.3 F (37.9 C)] 98.2 F (36.8 C) (11/13 0928) Pulse Rate:  [89-109] 89 (11/13 0928) Resp:  [16-22] 17 (11/13 0928) BP: (87-147)/(61-105) 126/81 (11/13 0928) SpO2:  [91 %-100 %] 91 % (11/13 0928)  General - Well nourished, well developed, drowsy and sleepy.  Ophthalmologic - fundi not visualized due to noncooperation.  Cardiovascular - Regular rhythm and rate.  Neuro - drowsy sleepy but eyes open with repetitive stimulation, orientated to place month and people, but would not tell me her age or year. No aphasia, paucity of speech but no dysarthria, following all simple commands. Able to name and repeat. No gaze palsy, tracking bilaterally, visual field testing not cooperative but suspect left hemianpia, PERRL. No facial droop. Tongue midline. RUE 4/5 RLE 3/5, but LUE 2/5 with swolloen and LLE mild withdraw to pain. Sensation, coordination not cooperative and gait not tested.   ASSESSMENT/PLAN Ms. NASIYA PASCUAL is a 81 y.o. female with history of hypertension, diabetes, endometrial cancer, PE in 01/2022 on Eliquis, strokes admitted for lethargy and altered mental status. No tPA given due to outside window.    Stroke:  right PCA subacute infarct with petechial hemorrhageic conversion, likely secondary to large vessel disease source CT showed right PCA subacute infarct MRI confirmed right PCA subacute infarct with petechial hemorrhagic transformation CTA head and neck left V4 occlusion, right PCA occlusion versus near occlusion, multifocal intracranial stenosis, largely unchanged from previous imaging CT repeat in am 2D Echo EF 60 to 65% in 01/2022 LDL 48 HgbA1c 5.0 Heparin subcu for VTE prophylaxis Eliquis (apixaban) daily prior to admission, now on aspirin 81 mg daily  given size of infarct and petechial hemorrhage transformation.  Will consider to resume Eliquis tomorrow if neuro and imaging stable Ongoing aggressive stroke risk factor management Therapy recommendations: SNF Disposition: Pending  History of stroke 10/2021 admitted for slurred speech, left-sided weakness.  CT showed large left precentral meningioma.  MRI showed right pontine large infarct.  CT head and neck marked stenosis, multifocal intracranial stenosis including moderate right P1/P2 stenosis.  EF 60 to 65%, LDL 133, A1c 7.0.  Discharged on DAPT and Crestor 20.  Later patient had bilateral stenting on 11/01/2021.  Also plan to follow-up with neurosurgery for possible left meningioma resection. 12/2021 admitted for nausea vomiting and worsening left-sided weakness.  CT no acute finding.  MRI showed new right pontine punctate infarct.  CTA head and neck showed left V4 occlusion, right P1/P2 moderate to severe stenosis, however basilar artery stent patent.  EF 60 to 65%.  Discharged on DAPT and Crestor 20 and fenofibrate 160.  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  Was intubated and status post thrombectomy.  Anemia received blood transfusion.  Started heparin IV with thrombocytopenia.  HIT antibody negative.  Eventually heparin IV transition to Eliquis on discharge.  Aspirin and Plavix will discontinue at the time. Per son, patient compliant with Eliquis Recommend to resume Eliquis around 11/14 if neuro and imaging stable  Diabetes HgbA1c 5.0 goal < 7.0 Controlled CBG monitoring SSI DM education and close PCP follow up  Hypertension Stable Long term BP goal normotensive  Hyperlipidemia Home meds: Crestor 20 LDL 48, goal < 70 Now on Crestor 20 Continue statin at discharge  Other Stroke Risk Factors Advanced age  Other Active Problems Left upper extremity swollen after PE last months per son, venous Doppler no DVT. Endometrial  cancer  Hospital day # 3  Rosalin Hawking, MD PhD Stroke Neurology 03/04/2022 1:55 PM    To contact Stroke Continuity provider, please refer to [email or web address] After hours, contact General Neurology

## 2022-03-04 NOTE — TOC Progression Note (Signed)
Transition of Care [facility]) - Progression Note    Patient Details  Name: Cassandra Dodson MRN: [id number] Date of Birth: Mar 27, 1941  Transition of Care [facility]) CM/SW [place], [place] Phone Number: 03/04/2022, 12:58 PM  Clinical Narrative:    CSW spoke with [facility] who advised pt has 6 covered Medicare days left. Helene Kelp is working with family to sort out copay expenses. No barriers to pt returning at DC, will need authorization. CSW spoke with pt's son who is in agreement to pt returning and will follow up with [facility] on specifics. Authorization initiated. TOC will continue to follow for DC needs.   Expected Discharge Plan: [place] Barriers to Discharge: Continued Medical Work up  Expected Discharge Plan and Services Expected Discharge Plan: [place] In-house Referral: Clinical Social Work   Post Acute Care Choice: [place] Living arrangements for the past 2 months: [place]                                       Social Determinants of Health (SDOH) Interventions Housing Interventions: Patient Refused  Readmission Risk Interventions     No data to display

## 2022-03-04 NOTE — Progress Notes (Signed)
  Progress Note   Patient: Cassandra Dodson TWS:[id number] DOB: [birth year] DOA: 03/01/2022     3 DOS: the patient was seen and examined on 03/04/2022   Brief hospital course: 81 year old woman from [facility] CVA with residual left-sided deficits and dysphagia, and GI, followed by neurosurgery, presented with lethargy and altered mental status.  Admitted for acute CVA.  Assessment and Plan: Acute right PCA stroke with hemorrhagic conversion, early subacute/acute infarct right frontoparietal white matter, likely caused by PCA stenosis, small infarct left cerebellar hemisphere age-indeterminate, PMH CVA with residual left-sided weakness -- Continue management per nephrology, currently with aspirin, statin. --Neurology checking CT tomorrow if stable, then can restart apixaban and continue aspirin for atherosclerosis.  Then will be cleared by neurology for discharge.   PMH PE on chronic anticoagulation --During hospitalization in October patient required intubation after being found to be hypoxic with saddle PE.   --Holding Eliquis due to acute stroke with hemorrhagic transformation.  Hopefully can restart tomorrow.  DM type 2 on insulin --start SSI, CBG checks, long-acting insulin if indicated   Normocytic anemia --Chronic.  Hemoglobin 10.1 g/dL which appears improved from prior. 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 --Continue outpatient follow-up with neurosurgery   Endometrial cancer --Continue outpatient follow-up with gynecologic oncology     Subjective:  Sleeping   Physical Exam: Vitals:   03/04/22 0432 03/04/22 0556 03/04/22 0928 03/04/22 1841  BP: 113/73 105/63 126/81 (!) 145/72  Pulse: (!) 106 (!) 105 89 90  Resp: (!) '22 16 17 16  '$ Temp: 100.3 F (37.9 C)  99.4 F (37.4 C) 98.2 F (36.8 C) 100 F (37.8 C)  TempSrc: Axillary Oral Oral Axillary  SpO2: 100% 93% 91% 90%  Weight:      Height:       Physical Exam Vitals reviewed.  Constitutional:      General: She is not in acute distress.    Appearance: She is not ill-appearing or toxic-appearing.  Cardiovascular:     Rate and Rhythm: Normal rate and regular rhythm.     Heart sounds: No murmur heard. Pulmonary:     Effort: Pulmonary effort is normal. No respiratory distress.     Breath sounds: No wheezing, rhonchi or rales.     Data Reviewed: There are no new results to review at this time.  Family Communication: none present  Disposition: Status is: Inpatient Remains inpatient appropriate because: stroke  Planned Discharge Destination: Skilled nursing facility    Time spent: 20 minutes  Author: Murray Hodgkins, MD 03/04/2022 7:23 PM  For on call review [email or web address].

## 2022-03-04 NOTE — Plan of Care (Signed)
  Problem: Ischemic Stroke/TIA Tissue Perfusion: Goal: Complications of ischemic stroke/TIA will be minimized Outcome: Not Progressing

## 2022-03-04 NOTE — Evaluation (Signed)
Speech Language Pathology Evaluation Patient Details Name: Cassandra Dodson MRN: [id number] DOB: 27-Feb-1941 Today's Date: 03/04/2022 Time: 4627-0350 SLP Time Calculation (min) (ACUTE ONLY): 20 min  Problem List:  Patient Active Problem List   Diagnosis Date Noted   History of CVA with residual deficit 03/01/2022   History of pulmonary embolism 03/01/2022   Chronic anticoagulation 03/01/2022   Meningioma ([place]) 03/01/2022   Malnutrition of moderate degree 02/02/2022   Pulmonary embolism with acute cor pulmonale ([place]) 01/26/2022   AMS (altered mental status) 01/11/2022   Dysphagia 11/28/2021   Normocytic anemia 11/28/2021   Fluctuating mental status 11/28/2021   Right pontine cerebrovascular accident ([place]) 11/05/2021   Pressure injury of skin 11/02/2021   Occlusion and stenosis of basilar artery 11/01/2021   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   UTI (urinary tract infection) 10/27/2021   Intracranial mass    Hyperlipidemia    Type 2 diabetes mellitus with stage 3b chronic kidney disease, without long-term current use of insulin (HCC)    CVA (cerebral vascular accident) ([place]) 10/26/2021   Stage 3b chronic kidney disease ([place])    Thrombocytopenia ([place])    History of endometrial cancer    Brain mass 10/25/2021   Endometrial cancer ([place]) 05/13/2019   Hypertension    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 Gery Pray   Hypertension    Hypothyroidism    Neuromuscular  disorder ([place])    neuropathy in feet   Osteoarthritis    PMB (postmenopausal bleeding)    PONV (postoperative nausea and vomiting)    severe nausea and vomiting after knee replacement 06-2014, did ok with 2018 knee replacement   Urinary frequency    Wears glasses    Past Surgical History:  Past Surgical History:  Procedure Laterality Date   BREAST SURGERY     cyst removed   CESAREAN SECTION     DILATION AND CURETTAGE OF UTERUS N/A 06/24/2019   Procedure: DILATATION AND CURETTAGE;  Surgeon: Lafonda Mosses, MD;  Location: [place];  Service: Gynecology;  Laterality: N/A;   FRACTURE SURGERY     right knee   INTRAUTERINE DEVICE (IUD) INSERTION N/A 06/24/2019   Procedure: INTRAUTERINE DEVICE (IUD) INSERTION MIRENA;  Surgeon: Lafonda Mosses, MD;  Location: [facility];  Service: Gynecology;  Laterality: N/A;   IR ANGIO INTRA EXTRACRAN SEL COM CAROTID INNOMINATE UNI R MOD SED  11/05/2021   IR ANGIO VERTEBRAL SEL VERTEBRAL UNI R MOD SED  11/05/2021   IR ANGIOGRAM PULMONARY BILATERAL SELECTIVE  01/28/2022   IR ANGIOGRAM SELECTIVE EACH ADDITIONAL VESSEL  01/28/2022   IR ANGIOGRAM SELECTIVE EACH ADDITIONAL VESSEL  01/28/2022   IR CT HEAD LTD  11/01/2021   IR INTRA CRAN STENT  11/01/2021   IR RADIOLOGIST EVAL & MGMT  12/19/2021   IR THROMBECT PRIM MECH INIT (INCLU) MOD SED  01/28/2022   IR THROMBECT PRIM MECH INIT (INCLU) MOD SED  01/28/2022  IR US GUIDE VASC ACCESS RIGHT  11/01/2021   IR US GUIDE VASC ACCESS RIGHT  01/28/2022   IRRIGATION AND DEBRIDEMENT SEBACEOUS CYST     JOINT REPLACEMENT Right    right   LYMPH NODE DISSECTION N/A 09/14/2019   Procedure: LYMPH NODE DISSECTION;  Surgeon: Lafonda Mosses, MD;  Location: [facility];  Service: Gynecology;  Laterality: N/A;   RADIOLOGY WITH ANESTHESIA N/A 11/01/2021   Procedure: ANGIOPLASTY/STENT;  Surgeon: Luanne Bras, MD;  Location: [place];  Service: Radiology;  Laterality: N/A;   RADIOLOGY WITH ANESTHESIA  N/A 01/28/2022   Procedure: IR WITH ANESTHESIA;  Surgeon: Radiologist, Medication, MD;  Location: [place];  Service: Radiology;  Laterality: N/A;   ROBOTIC ASSISTED TOTAL HYSTERECTOMY WITH BILATERAL SALPINGO OOPHERECTOMY Bilateral 09/14/2019   Procedure: XI ROBOTIC ASSISTED TOTAL HYSTERECTOMY WITH BILATERAL SALPINGO OOPHORECTOMY;  Surgeon: Lafonda Mosses, MD;  Location: [facility];  Service: Gynecology;  Laterality: Bilateral;   SENTINEL NODE BIOPSY N/A 09/14/2019   Procedure: SENTINEL NODE BIOPSY;  Surgeon: Lafonda Mosses, MD;  Location: [facility];  Service: Gynecology;  Laterality: N/A;   teeth extration     TONSILLECTOMY     TOTAL HIP ARTHROPLASTY Right 02/23/2015   Procedure: RIGHT TOTAL HIP ARTHROPLASTY ANTERIOR APPROACH;  Surgeon: Leandrew Koyanagi, MD;  Location: [place];  Service: Orthopedics;  Laterality: Right;   TOTAL KNEE ARTHROPLASTY Left 06/27/2016   Procedure: LEFT TOTAL KNEE ARTHROPLASTY;  Surgeon: Leandrew Koyanagi, MD;  Location: [place];  Service: Orthopedics;  Laterality: Left;   HPI:  81yo female admitted from [facility] 03/01/22 due to increased lethargy and AMS, refusing PO intake. CTHead = (sub)acute R PCA stroke with petechial hemorrhage. PMH: hospitalization 9/22-10/28, adm for AMS, found to have PE, CVA and frontal meningioma with left side deficit, speech difficulty, and dysphagia, depression, HTN, neuropathy in feet, OA, CKD3, basilar artery stent, DM, early stage uterine cancer, endometrial cancer, GERD. MBS 02/04/22 = D1/HTL - silent aspiration. Last SLP f/u 02/15/22 - D1/HTL, crushed meds   Assessment / Plan / Recommendation Clinical Impression  Pt seen at bedside for cognitive linguistic assessment. Of note, pt underwent inpatient rehab stay in August 2023. CIR DC August 2023 - receptive/expressive language grossly intact for concrete concepts/directives with extra time. Speech intelligible. Cog/ling = premorbid deficits suspected (left frontal meningioma PTA).   Today, pt was noted  to be significantly hard of hearing, with frequent repetition, visual cues, and increased volume needed. Pt's speech continues to be fully intelligible. The Mini-Mental State Examination (MMSE) was administered today. 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 Pt was unable to provide year, season, date, day, or month. She reported being at the [facility] in [place], but was unable to provide any other orientation information. She was accurate for her birth date and month, but unable to provide the year. Good immediate recall of 3 unrelated words with multiple repetitions. She was unable to recall any of the words after a brief delay. Pt could not spell world backward. She was correct naming 2/2 common objects. She repeated a phrase, then perseverated on it for the remainder of the assessment. Pt followed 1 step out of a 3 step command. Left neglect was apparent during reading, writing, and figure copying tasks.   Pt may be at or near cognitive linguistic baseline, however, her wet voice quality noted after thin liquids is concerning, especially given the severity of her dysphagia during prior admission. It is uncertain if pt was on thickened liquids  at [place]. CXR on admit indicated no active disease. Repeat MBS is recommended, given severity of dysphagia and documentation of silent aspiration on last MBS.    SLP Assessment  SLP Recommendation/Assessment: Patient needs continued Speech Language Pathology Services  SLP Visit Diagnosis: Cognitive communication deficit (R41.841)    Recommendations for follow up therapy are one component of a multi-disciplinary discharge planning process, led by the attending physician.  Recommendations may be updated based on patient status, additional functional criteria and insurance authorization.    Follow Up Recommendations  Skilled nursing-short term rehab (<3 hours/day)    Assistance Recommended at Discharge  Frequent or constant  Supervision/Assistance  Functional Status Assessment Patient has had a recent decline in their functional status and/or demonstrates limited ability to make significant improvements in function in a reasonable and predictable amount of time  Frequency and Duration min 1 x/week  2 weeks      SLP Evaluation Cognition  Baseline deficits. See impressions above      Comprehension/Expression  See impressions above       Oral / Motor  Oral Motor/Sensory Function Overall Oral Motor/Sensory Function: Within functional limits Motor Speech Overall Motor Speech: Appears within functional limits for tasks assessed Intelligibility: Intelligible           Areen Trautner B. Quentin Ore, [facility], [place] Speech Language Pathologist Office: [phone]  [place], [place] 03/04/2022, 11:02 AM

## 2022-03-04 NOTE — Progress Notes (Signed)
Occupational Therapy Treatment Patient Details Name: Cassandra Dodson MRN: [id number] DOB: [birth year] Today's Date: 03/04/2022   History of present illness Pt is an 81 y.o female presenting to [facility] ED from [facility] with increased lethargy and AMS. 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 Pt with recent hospital admission for AMS and was found to have PE 9/22-10/28. PMH significant for pulmonary embolism, frontal meningioma, CKD, CVA, basilar artery stent, and DM.   OT comments  Pt making good improvements this am toward OT goals. Pt following commands more consistently and is more awake, alert and participatory.  Pt fed self breakfast with setup and VCS. Pt rolled in bed to address toileting needs with mod assist. Pt with significant swelling in LUE. Pt has jobst garment for L hand and could use garment that goes up past elbow.  Will continue to focus on basic adls and OOB tasks.   Recommendations for follow up therapy are one component of a multi-disciplinary discharge planning process, led by the attending physician.  Recommendations may be updated based on patient status, additional functional criteria and insurance authorization.    Follow Up Recommendations  Skilled nursing-short term rehab (<3 hours/day)    Assistance Recommended at Discharge Frequent or constant Supervision/Assistance  Patient can return home with the following  Direct supervision/assist for medications management;Direct supervision/assist for financial management;Assist for transportation;Help with stairs or ramp for entrance;Two people to help with bathing/dressing/bathroom;Assistance with cooking/housework;Two people to help with walking and/or transfers   Equipment Recommendations  Other (comment) (tbd)    Recommendations for Other Services      Precautions / Restrictions Precautions Precautions: Fall Precaution Comments: LUE residual deficits due to hx of CVAs and  increased edema Restrictions Weight Bearing Restrictions: No       Mobility Bed Mobility Overal bed mobility: Needs Assistance Bed Mobility: Rolling, Supine to Sit, Sit to Supine Rolling: Mod assist   Supine to sit: Max assist Sit to supine: Total assist   General bed mobility comments: Pt required +2 to move up in bed to get into good positon for feeding self.    Transfers                   General transfer comment: deferred today as pt wet on arrival. Worked Bed mobility performed to change sheets and pt moved up in bed for feeding.     Balance Overall balance assessment: Needs assistance Sitting-balance support: No upper extremity supported Sitting balance-Leahy Scale: Poor Sitting balance - Comments: in chair posiition, leaning to her right and required total assist to correct Postural control: Right lateral lean                                 ADL either performed or assessed with clinical judgement   ADL Overall ADL's : Needs assistance/impaired Eating/Feeding: Set up;Bed level Eating/Feeding Details (indicate cue type and reason): After set up pt fed self without assist for actual feeding.  Food placed in front of her for ease of retrieval. Grooming: Wash/dry hands;Wash/dry face;Brushing hair Grooming Details (indicate cue type and reason): min assist to get to top of head for hair but was able to wash face and hands with set up                     Toileting- Clothing Manipulation and Hygiene: Total assistance;Bed level Toileting - Clothing  Manipulation Details (indicate cue type and reason): P tincontinient of bladder in bed. Bed soaked on arrival. Pt rolled with mod assist L and R to be cleaned up. Pt unable to assist with cleaning.     Functional mobility during ADLs: Maximal assistance General ADL Comments: Pt improved today with alertness and participation in all adls    Extremity/Trunk Assessment Upper Extremity  Assessment Upper Extremity Assessment: LUE deficits/detail LUE Deficits / Details: LUE edema. PROM decresed with shoulder flexion to ~80 degrees, abduction to ~60 degrees with notable tightness in pecs and shoulder girdle. min wiggling of fingers for active movement. elbow flexion decreased, but WFL. LUE: Unable to fully assess due to pain LUE Sensation: decreased light touch LUE Coordination: decreased fine motor;decreased gross motor   Lower Extremity Assessment Lower Extremity Assessment: Defer to PT evaluation        Vision   Vision Assessment?: No apparent visual deficits Alignment/Gaze Preference: Head turned;Gaze right   Perception Perception Perception: Impaired   Praxis Praxis Praxis: Not tested    Cognition Arousal/Alertness: Awake/alert Behavior During Therapy: Flat affect Overall Cognitive Status: Difficult to assess Area of Impairment: Orientation, Attention, Memory, Following commands, Safety/judgement, Awareness, Problem solving                 Orientation Level: Disoriented to, Time, Situation, Place Current Attention Level: Focused Memory: Decreased short-term memory Following Commands: Follows one step commands inconsistently Safety/Judgement: Decreased awareness of safety, Decreased awareness of deficits Awareness: Intellectual Problem Solving: Slow processing, Decreased initiation, Requires verbal cues, Requires tactile cues, Difficulty sequencing General Comments: Pt following single step commands well. Pt is HOH so need to get right up to her ear to give commands        Exercises      Shoulder Instructions       General Comments Pt unable to stand. pt with more participation today with all adls at bed level. Pt very HOH.    Pertinent Vitals/ Pain       Pain Assessment Pain Assessment: No/denies pain Pain Score: 0-No pain  Home Living                                          Prior Functioning/Environment               Frequency  Min 2X/week        Progress Toward Goals  OT Goals(current goals can now be found in the care plan section)  Progress towards OT goals: Progressing toward goals  Acute Rehab OT Goals Patient Stated Goal: none stated OT Goal Formulation: Patient unable to participate in goal setting Time For Goal Achievement: 03/16/22 Potential to Achieve Goals: Fair ADL Goals Pt Will Perform Eating: with set-up;sitting Pt Will Perform Grooming: with mod assist;sitting Pt Will Perform Upper Body Bathing: with min assist;sitting Pt Will Perform Lower Body Bathing: with min guard assist;sitting/lateral leans Pt Will Perform Upper Body Dressing: with mod assist;sitting Pt Will Perform Lower Body Dressing: with max assist;sit to/from stand Pt Will Transfer to Toilet: with mod assist;with +2 assist;stand pivot transfer;bedside commode Pt Will Perform Toileting - Clothing Manipulation and hygiene: with mod assist Pt/caregiver will Perform Home Exercise Program: Increased strength;Right Upper extremity;Left upper extremity;With minimal assist Additional ADL Goal #1: Pt will demonstrate sitting balance for 3 mins with min guard A in preparation for ADL Additional ADL Goal #2: Pt will perform  bed mobility with min A as precursor to ADL  Plan Discharge plan remains appropriate;Frequency remains appropriate    Co-evaluation                 AM-PAC OT "6 Clicks" Daily Activity     Outcome Measure   Help from another person eating meals?: A Little Help from another person taking care of personal grooming?: A Little Help from another person toileting, which includes using toliet, bedpan, or urinal?: Total Help from another person bathing (including washing, rinsing, drying)?: Total Help from another person to put on and taking off regular upper body clothing?: Total Help from another person to put on and taking off regular lower body clothing?: Total 6 Click Score: 10    End of  Session    OT Visit Diagnosis: Unsteadiness on feet (R26.81);Other abnormalities of gait and mobility (R26.89);Muscle weakness (generalized) (M62.81);Other symptoms and signs involving the nervous system (R29.898);Other symptoms and signs involving cognitive function;Pain   Activity Tolerance Patient limited by fatigue   Patient Left in bed;with call bell/phone within reach;with bed alarm set   Nurse Communication Mobility status        Time: 4383-7793 OT Time Calculation (min): 36 min  Charges: OT General Charges $OT Visit: 1 Visit OT Treatments $Self Care/Home Management : 23-37 mins   Glenford Peers 03/04/2022, 9:29 AM

## 2022-03-05 ENCOUNTER — Inpatient Hospital Stay (HOSPITAL_COMMUNITY): Payer: Medicare Other

## 2022-03-05 LAB — MAGNESIUM: Magnesium: 1.6 mg/dL — ABNORMAL LOW (ref 1.7–2.4)

## 2022-03-05 LAB — BASIC METABOLIC PANEL
Anion gap: 9 (ref 5–15)
BUN: 9 mg/dL (ref 8–23)
CO2: 25 mmol/L (ref 22–32)
Calcium: 9.1 mg/dL (ref 8.9–10.3)
Chloride: 100 mmol/L (ref 98–111)
Creatinine, Ser: 0.59 mg/dL (ref 0.44–1.00)
GFR, Estimated: >60 mL/min (ref >60)
Glucose, Bld: 133 mg/dL — ABNORMAL HIGH (ref 70–99)
Potassium: 3.8 mmol/L (ref 3.5–5.1)
Sodium: 134 mmol/L — ABNORMAL LOW (ref 135–145)

## 2022-03-05 LAB — GLUCOSE, CAPILLARY
Glucose-Capillary: 154 mg/dL — ABNORMAL HIGH (ref 70–99)
Glucose-Capillary: 155 mg/dL — ABNORMAL HIGH (ref 70–99)
Glucose-Capillary: 190 mg/dL — ABNORMAL HIGH (ref 70–99)
Glucose-Capillary: 198 mg/dL — ABNORMAL HIGH (ref 70–99)
Glucose-Capillary: 186 mg/dL — ABNORMAL HIGH (ref 70–99)

## 2022-03-05 LAB — PHOSPHORUS: Phosphorus: 2.5 mg/dL (ref 2.5–4.6)

## 2022-03-05 MED ORDER — APIXABAN 5 MG PO TABS
5.0000 mg | ORAL_TABLET | Freq: Two times a day (BID) | ORAL | Status: DC
  Administered 2022-03-05: 5 mg via ORAL
  Filled 2022-03-05 (×3): qty 1

## 2022-03-05 NOTE — TOC Progression Note (Addendum)
Transition of Care [facility]) - Progression Note    Patient Details  Name: Cassandra Dodson MRN: [id number] Date of Birth: Feb 13, 1941  Transition of Care [facility]) CM/SW [place], [place] Phone Number: 03/05/2022, 12:27 PM  Clinical Narrative:     CSW updated that pt's authorization has gone to medical review as pt is close to her prior level of function. TOC will continue to follow for further needs.  3:50 MD notified Josem Kaufmann went to peer to peer. Deadline is tomorrow by 10:30am. Number for provider to call: [phone] opt 5. Will need to relay name, DOB, and member ID   Expected Discharge Plan: Skilled Nursing Facility Barriers to Discharge: Continued Medical Work up  Expected Discharge Plan and Services Expected Discharge Plan: [place] In-house Referral: Clinical Social Work   Post Acute Care Choice: [place] Living arrangements for the past 2 months: [place]                                       Social Determinants of Health (SDOH) Interventions Housing Interventions: Patient Refused  Readmission Risk Interventions     No data to display

## 2022-03-05 NOTE — Progress Notes (Signed)
Tylenol '650mg'$  suppository was given for temp 99.6

## 2022-03-05 NOTE — Progress Notes (Signed)
Physical Therapy Treatment Patient Details Name: Cassandra Dodson MRN: [id number] DOB: 1940-05-12 Today's Date: 03/05/2022   History of Present Illness Pt is an 81 y.o female presenting to [facility] ED from [place] with increased lethargy and AMS. 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 Pt with recent hospital admission for AMS and was found to have PE 9/22-10/28. PMH significant for pulmonary embolism, frontal meningioma, CKD, CVA, basilar artery stent, and DM.    PT Comments    Patient progressing with sitting balance, standing attempts and with level of arousal.  She still needs close S to min A for balance on EOB and has decreased midline awareness.  She was able to transfer to chair using bed pads under hips for lateral scoot.  Standing too difficulty for attempts at pivot or stand step transfers.  PT will continue to follow acutely.  Continue to recommend return to SNF at d/c.   Recommendations for follow up therapy are one component of a multi-disciplinary discharge planning process, led by the attending physician.  Recommendations may be updated based on patient status, additional functional criteria and insurance authorization.  Follow Up Recommendations  Skilled nursing-short term rehab (<3 hours/day) Can patient physically be transported by private vehicle: No   Assistance Recommended at Discharge Frequent or constant Supervision/Assistance  Patient can return home with the following Two people to help with walking and/or transfers;Assistance with cooking/housework;Assistance with feeding;Direct supervision/assist for medications management;Direct supervision/assist for financial management;Assist for transportation;Help with stairs or ramp for entrance   Equipment Recommendations  None recommended by PT    Recommendations for Other Services       Precautions / Restrictions Precautions Precautions: Fall Precaution Comments: LUE  residual deficits due to hx of CVAs and increased edema     Mobility  Bed Mobility Overal bed mobility: Needs Assistance Bed Mobility: Supine to Sit   Sidelying to sit: HOB elevated, Mod assist, +2 for physical assistance       General bed mobility comments: assist for legs off bed and to lift trunk pt pulling up with R UE    Transfers Overall transfer level: Needs assistance Equipment used: 2 person hand held assist Transfers: Sit to/from Stand, Bed to chair/wheelchair/BSC Sit to Stand: Total assist, +2 physical assistance          Lateral/Scoot Transfers: Max assist, +2 physical assistance General transfer comment: up x 2 with attempts to stand and limited weight acceptance on feet so unable to transfer from standing so used bed pad under pt to scoot to EOB then to chair with drop arm    Ambulation/Gait                   Stairs             Wheelchair Mobility    Modified Rankin (Stroke Patients Only) Modified Rankin (Stroke Patients Only) Pre-Morbid Rankin Score: Severe disability Modified Rankin: Severe disability     Balance Overall balance assessment: Needs assistance Sitting-balance support: Feet supported Sitting balance-Leahy Scale: Poor Sitting balance - Comments: R lateral lean and progressively anterior lean; min A at times and S at times with cues used pictures in the room to increase upright posture, attention to L and balance work Postural control: Right lateral lean   Standing balance-Leahy Scale: Zero Standing balance comment: +2 to attempt standing  Cognition Arousal/Alertness: Awake/alert Behavior During Therapy: Flat affect Overall Cognitive Status: Impaired/Different from baseline                   Orientation Level: Disoriented to, Time Current Attention Level: Sustained Memory: Decreased short-term memory Following Commands: Follows one step commands with increased time,  Follows one step commands consistently Safety/Judgement: Decreased awareness of deficits   Problem Solving: Slow processing          Exercises Other Exercises Other Exercises: L UE PROM/AAROM for elbow flex/ext; forearm pron/sup and digit flex/ext Other Exercises: L LE AAROM ankle DF/PF and heel slides x 5    General Comments        Pertinent Vitals/Pain Pain Assessment Faces Pain Scale: Hurts even more Pain Location: L shoulder with movement Pain Descriptors / Indicators: Guarding, Grimacing Pain Intervention(s): Monitored during session, Repositioned, Limited activity within patient's tolerance    Home Living                          Prior Function            PT Goals (current goals can now be found in the care plan section) Progress towards PT goals: Progressing toward goals    Frequency    Min 2X/week      PT Plan Current plan remains appropriate    Co-evaluation              AM-PAC PT "6 Clicks" Mobility   Outcome Measure  Help needed turning from your back to your side while in a flat bed without using bedrails?: Total Help needed moving from lying on your back to sitting on the side of a flat bed without using bedrails?: Total Help needed moving to and from a bed to a chair (including a wheelchair)?: Total Help needed standing up from a chair using your arms (e.g., wheelchair or bedside chair)?: Total Help needed to walk in hospital room?: Total   6 Click Score: 5    End of Session Equipment Utilized During Treatment: Gait belt Activity Tolerance: Patient limited by fatigue Patient left: in chair;with call bell/phone within reach;with chair alarm set Nurse Communication: Mobility status;Need for lift equipment PT Visit Diagnosis: Other abnormalities of gait and mobility (R26.89);Muscle weakness (generalized) (M62.81)     Time: 5277-8242 PT Time Calculation (min) (ACUTE ONLY): 33 min  Charges:  $Therapeutic Activity: 23-37  mins                     Magda Kiel, PT Acute Rehabilitation Services [phone] 03/05/2022    Reginia Naas 03/05/2022, 4:06 PM

## 2022-03-05 NOTE — Progress Notes (Addendum)
  Progress Note   Patient: Cassandra Dodson OYD:[id number] DOB: [birth year] DOA: 03/01/2022     4 DOS: the patient was seen and examined on 03/05/2022   Brief hospital course: 81 year old woman from [facility] CVA with residual left-sided deficits and dysphagia, and GI, followed by neurosurgery, presented with lethargy and altered mental status.  Admitted for acute CVA.  Assessment and Plan: Acute right PCA stroke with petechial hemorrhagic conversion, early subacute/acute infarct right frontoparietal white matter, likely caused by PCA stenosis, small infarct left cerebellar hemisphere age-indeterminate, PMH CVA with residual left-sided weakness --Continue management per neurology.  Repeat CT without obvious hemorrhagic transformation.  Resuming Eliquis today as per neurology.  Continue aspirin.  Continue both on discharge.   PMH PE on chronic anticoagulation --During hospitalization in October patient required intubation after being found to be hypoxic with saddle PE.   -- Resuming Eliquis today.   DM type 2 on Lantus and Humalog meal coverage PTA --CBG remains stable, continue SSI, CBG checks. Resume long-acting insulin when indicated  Essential HTN -- Stable.   Normocytic anemia --Chronic.  Hemoglobin 10.1 g/dL which appears improved from prior. 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 --Continue outpatient follow-up with neurosurgery   Endometrial cancer --Continue outpatient follow-up with gynecologic oncology  Peer to peer complete and decision reversed. SNF approved. TOC updated.     Subjective: feels ok  Physical Exam: Vitals:   03/05/22 0327 03/05/22 0331 03/05/22 0933 03/05/22 1340  BP: (!) 126/103 (!) 163/85 120/63 101/75  Pulse: (!) 102  90 85  Resp: '14  17 19 20  '$ Temp: (!) 97.1 F (36.2 C) 99.1 F (37.3 C) 97.8 F (36.6 C) 98.8 F (37.1 C)  TempSrc:  Axillary Axillary Oral  SpO2: 100% 100% 100% 95%  Weight:      Height:       Physical Exam Vitals reviewed.  Constitutional:      General: She is not in acute distress.    Appearance: She is not ill-appearing or toxic-appearing.  Cardiovascular:     Rate and Rhythm: Normal rate and regular rhythm.     Heart sounds: No murmur heard. Pulmonary:     Effort: Pulmonary effort is normal. No respiratory distress.     Breath sounds: No wheezing, rhonchi or rales.  Neurological:     Mental Status: She is alert.     Comments: Left-sided hemiplegia Moves RUE and RLE      Data Reviewed: CBG stable  Family Communication: none  Disposition: Status is: Inpatient Remains inpatient appropriate because: stroke, SNF  Planned Discharge Destination: Skilled nursing facility    Time spent: 20 minutes  Author: Murray Hodgkins, MD 03/05/2022 4:20 PM  For on call review [email or web address].

## 2022-03-05 NOTE — Evaluation (Signed)
Clinical/Bedside Swallow Evaluation Patient Details  Name: Cassandra Dodson MRN: [id number] Date of Birth: 07/16/1940  Today's Date: 03/05/2022 Time: SLP Start Time (ACUTE ONLY): 9379 SLP Stop Time (ACUTE ONLY): 0240 SLP Time Calculation (min) (ACUTE ONLY): 25 min  Past Medical History:  Past Medical History:  Diagnosis Date   Arthritis    CKD (chronic kidney disease), stage III ([place])    patient denies   Depression    Diabetes mellitus without complication ([place])    Difficult intravenous access    Endometrial cancer ([place])    GERD (gastroesophageal reflux disease)    Headache    History of radiation therapy 11/04/2019-12/01/2019   Endometrial HDR; Dr. Gery Pray   Hypertension    Hypothyroidism    Neuromuscular disorder ([place])    neuropathy in feet   Osteoarthritis    PMB (postmenopausal bleeding)    PONV (postoperative nausea and vomiting)    severe nausea and vomiting after knee replacement 06-2014, did ok with 2018 knee replacement   Urinary frequency    Wears glasses    Past Surgical History:  Past Surgical History:  Procedure Laterality Date   BREAST SURGERY     cyst removed   CESAREAN SECTION     DILATION AND CURETTAGE OF UTERUS N/A 06/24/2019   Procedure: DILATATION AND CURETTAGE;  Surgeon: Lafonda Mosses, MD;  Location: [place];  Service: Gynecology;  Laterality: N/A;   FRACTURE SURGERY     right knee   INTRAUTERINE DEVICE (IUD) INSERTION N/A 06/24/2019   Procedure: INTRAUTERINE DEVICE (IUD) INSERTION MIRENA;  Surgeon: Lafonda Mosses, MD;  Location: [facility];  Service: Gynecology;  Laterality: N/A;   IR ANGIO INTRA EXTRACRAN SEL COM CAROTID INNOMINATE UNI R MOD SED  11/05/2021   IR ANGIO VERTEBRAL SEL VERTEBRAL UNI R MOD SED  11/05/2021   IR ANGIOGRAM PULMONARY BILATERAL SELECTIVE  01/28/2022   IR ANGIOGRAM SELECTIVE EACH ADDITIONAL VESSEL  01/28/2022   IR ANGIOGRAM SELECTIVE EACH ADDITIONAL VESSEL  01/28/2022   IR CT HEAD LTD   11/01/2021   IR INTRA CRAN STENT  11/01/2021   IR RADIOLOGIST EVAL & MGMT  12/19/2021   IR THROMBECT PRIM MECH INIT (INCLU) MOD SED  01/28/2022   IR THROMBECT PRIM MECH INIT (INCLU) MOD SED  01/28/2022   IR US GUIDE VASC ACCESS RIGHT  11/01/2021   IR US GUIDE VASC ACCESS RIGHT  01/28/2022   IRRIGATION AND DEBRIDEMENT SEBACEOUS CYST     JOINT REPLACEMENT Right    right   LYMPH NODE DISSECTION N/A 09/14/2019   Procedure: LYMPH NODE DISSECTION;  Surgeon: Lafonda Mosses, MD;  Location: [facility];  Service: Gynecology;  Laterality: N/A;   RADIOLOGY WITH ANESTHESIA N/A 11/01/2021   Procedure: ANGIOPLASTY/STENT;  Surgeon: Luanne Bras, MD;  Location: [place];  Service: Radiology;  Laterality: N/A;   RADIOLOGY WITH ANESTHESIA N/A 01/28/2022   Procedure: IR WITH ANESTHESIA;  Surgeon: Radiologist, Medication, MD;  Location: [place];  Service: Radiology;  Laterality: N/A;   ROBOTIC ASSISTED TOTAL HYSTERECTOMY WITH BILATERAL SALPINGO OOPHERECTOMY Bilateral 09/14/2019   Procedure: XI ROBOTIC ASSISTED TOTAL HYSTERECTOMY WITH BILATERAL SALPINGO OOPHORECTOMY;  Surgeon: Lafonda Mosses, MD;  Location: [facility];  Service: Gynecology;  Laterality: Bilateral;   SENTINEL NODE BIOPSY N/A 09/14/2019   Procedure: SENTINEL NODE BIOPSY;  Surgeon: Lafonda Mosses, MD;  Location: [facility];  Service: Gynecology;  Laterality: N/A;   teeth extration     TONSILLECTOMY     TOTAL  HIP ARTHROPLASTY Right 02/23/2015   Procedure: RIGHT TOTAL HIP ARTHROPLASTY ANTERIOR APPROACH;  Surgeon: Leandrew Koyanagi, MD;  Location: [place];  Service: Orthopedics;  Laterality: Right;   TOTAL KNEE ARTHROPLASTY Left 06/27/2016   Procedure: LEFT TOTAL KNEE ARTHROPLASTY;  Surgeon: Leandrew Koyanagi, MD;  Location: [place];  Service: Orthopedics;  Laterality: Left;   HPI:  81yo female admitted from [facility] 03/01/22 due to increased lethargy and AMS, refusing PO intake. CTHead = (sub)acute R PCA stroke with petechial hemorrhage. PMH: hospitalization 9/22-10/28,  adm for AMS, found to have PE, CVA and frontal meningioma with left side deficit, speech difficulty, and dysphagia, depression, HTN, neuropathy in feet, OA, CKD3, basilar artery stent, DM, early stage uterine cancer, endometrial cancer, GERD. MBS 02/04/22 = D1/HTL - silent aspiration. Last SLP f/u 02/15/22 - D1/HTL, crushed meds    Assessment / Plan / Recommendation  Clinical Impression  Pt seen at bedside to assess swallow function and safety. Pt was observed self feeding lunch with pot roast, mashed potatoes, corn, milk and tea. Pt was repositioned to upright prior to the meal. She requested her drinks be opened for her. Pt was able to self feed puree and solid items with timely oral prep and adequate clearing. Cough response noted x1 during the entire meal. Pt was noted to consume a great deal of liquids during lunch - full containers of milk, tea, and water, taking large consecutive boluses. Pt is known to SLP services, having had silent aspiration documented on most recent MBS (October 2023). For this reason, repeat MBS is recommended to assess current swallow physiology and rule out silent aspiration. Will schedule with radiology. RN and MD aware.  SLP Visit Diagnosis: Dysphagia, unspecified (R13.10)    Aspiration Risk  Moderate aspiration risk    Diet Recommendation Regular;Thin liquid   Liquid Administration via: Cup;Straw Medication Administration: Crushed with puree Supervision: Patient able to self feed;Intermittent supervision to cue for compensatory strategies Compensations: Slow rate;Small sips/bites;Minimize environmental distractions;Clear throat intermittently Postural Changes: Seated upright at 90 degrees    Other  Recommendations Oral Care Recommendations: Oral care BID    Recommendations for follow up therapy are one component of a multi-disciplinary discharge planning process, led by the attending physician.  Recommendations may be updated based on patient status, additional  functional criteria and insurance authorization.  Follow up Recommendations Skilled nursing-short term rehab (<3 hours/day)      Assistance Recommended at Discharge  TBD  Functional Status Assessment Patient has had a recent decline in their functional status and/or demonstrates limited ability to make significant improvements in function in a reasonable and predictable amount of time   Frequency and Duration Pending MBS       Prognosis Prognosis for Safe Diet Advancement: Fair Barriers to Reach Goals: Severity of deficits;Cognitive deficits      Swallow Study   General Date of Onset: 03/01/22 HPI: 81yo female admitted from [facility] 03/01/22 due to increased lethargy and AMS, refusing PO intake. CTHead = (sub)acute R PCA stroke with petechial hemorrhage. PMH: hospitalization 9/22-10/28, adm for AMS, found to have PE, CVA and frontal meningioma with left side deficit, speech difficulty, and dysphagia, depression, HTN, neuropathy in feet, OA, CKD3, basilar artery stent, DM, early stage uterine cancer, endometrial cancer, GERD. MBS 02/04/22 = D1/HTL - silent aspiration. Last SLP f/u 02/15/22 - D1/HTL, crushed meds Type of Study: Bedside Swallow Evaluation Previous Swallow Assessment: MBS October 2023 - silent aspiration of thins Diet Prior to this Study: Regular;Thin liquids Temperature Spikes  Noted: No Respiratory Status: Nasal cannula History of Recent Intubation: No Behavior/Cognition: Alert;Cooperative;Confused;Pleasant mood;Requires cueing Oral Cavity Assessment: Within Functional Limits Oral Care Completed by SLP: No Oral Cavity - Dentition: Poor condition;Missing dentition Vision: Functional for self-feeding Self-Feeding Abilities: Able to feed self;Needs set up Patient Positioning: Upright in bed Baseline Vocal Quality: Normal Volitional Cough: Cognitively unable to elicit Volitional Swallow: Unable to elicit    Oral/Motor/Sensory Function Overall Oral Motor/Sensory  Function: Generalized oral weakness   Ice Chips Ice chips: Not tested   Thin Liquid Thin Liquid: Within functional limits Presentation: Self Fed;Straw Other Comments: No cough response noted after thin liquids, however, pt had silent aspiration of thins on October 2023 MBS.    Nectar Thick Nectar Thick Liquid: Not tested   Honey Thick Honey Thick Liquid: Not tested   Puree Puree: Within functional limits Presentation: Spoon;Self Fed   Solid     Solid: Within functional limits Presentation: [place] B. Quentin Ore, [facility], [place] Speech Language Pathologist Office: [phone]  [place], [place] 03/05/2022,2:09 PM

## 2022-03-06 ENCOUNTER — Other Ambulatory Visit (HOSPITAL_COMMUNITY): Payer: Self-pay

## 2022-03-06 ENCOUNTER — Inpatient Hospital Stay (HOSPITAL_COMMUNITY): Payer: Medicare Other

## 2022-03-06 DIAGNOSIS — Z794 Long term (current) use of insulin: Secondary | ICD-10-CM

## 2022-03-06 LAB — CULTURE, BLOOD (ROUTINE X 2)
Culture: NO GROWTH
Culture: NO GROWTH

## 2022-03-06 LAB — GLUCOSE, CAPILLARY
Glucose-Capillary: 168 mg/dL — ABNORMAL HIGH (ref 70–99)
Glucose-Capillary: 134 mg/dL — ABNORMAL HIGH (ref 70–99)

## 2022-03-06 MED ORDER — ASPIRIN 81 MG PO TBEC
81.0000 mg | DELAYED_RELEASE_TABLET | Freq: Every day | ORAL | 12 refills | Status: DC
  Filled 2022-03-06: qty 30, 30d supply, fill #0

## 2022-03-06 NOTE — TOC Transition Note (Signed)
Transition of Care [facility]) - CM/SW Discharge Note   Patient Details  Name: Cassandra Dodson MRN: [id number] Date of Birth: [birth year]  Transition of Care [facility]) CM/SW Contact:  Coralee Pesa, [place] Phone Number: 03/06/2022, 12:19 PM   Clinical Narrative:     Pt to be transported to [facility] via [place]. Nurse to call report to [phone]  [id number] 03/05/2022-03/07/2022 Approved  Final next level of care: Skilled Nursing Facility Barriers to Discharge: Barriers Resolved   Patient Goals and CMS Choice Patient states their goals for this hospitalization and ongoing recovery are:: SNF for continued Rehab CMS Medicare.gov Compare Post Acute Care list provided to:: Patient Choice offered to / list presented to : Adult Children  Discharge Placement              Patient chooses bed at: [facility] Patient to be transferred to facility by: [place] Name of family member notified: [name] Patient and family notified of of transfer: 03/06/22  Discharge Plan and Services In-house Referral: Clinical Social Work   Post Acute Care Choice: [place]                               Social Determinants of Health (SDOH) Interventions Housing Interventions: Patient Refused   Readmission Risk Interventions     No data to display

## 2022-03-06 NOTE — Progress Notes (Signed)
Modified Barium Swallow Progress Note  Patient Details  Name: Cassandra Dodson MRN: [id number] Date of Birth: [birth year]  Today's Date: 03/06/2022  Modified Barium Swallow completed.  Full report located under Chart Review in the Imaging Section.  Brief recommendations include the following:  Clinical Impression Pt presents with a mild-moderate oropharyngeal dysphagia, which is an improvement over the previous study. Characterized as follows:  ORAL: Pt exhibits poor bolus formation and poorly controlled posterior propulsion, resulting in premature posterior spillage over the tongue base. Extended mastication was observed  on solid texture, and raises concern for aspiration risk with progressive fatigue during meals.   PHARYNGEAL: Pt exhibits swallow reflex at the level of the vallecula (puree and solid) or pyriform sinus (thin and nectar thick liquids). Pt demonstrates adequate hyolaryngeal elevation and epiglottic inversion. Trace penetration of thin and nectar thick liquids was noted during and after the swallow, but cleared well. Penetration occurred when pt was taking large consecutive boluses, which she tends to do during meals.   ESOPHAGEAL: sweep of the esophagus revealed it to be clear, without stasis.  Recommend regular diet with thin liquids, crushed meds in puree. Recommend SLP follow up at next level of care, focused on educating pt/family of the importance of adherence to safe swallow precautions. These include: upright position, SMALL bites and sips at a SLOW rate, with no consecutive boluses to minimize aspiration risk. Pt has difficulty following verbal commands to swallow or throat clear, due in part at least to being significantly hard of hearing. This information was written on a swallowing precautions sheet and placed in pt packet for discharge.      Swallow Evaluation Recommendations  SLP Diet Recommendations: Thin liquid;Regular solids   Liquid Administration via:  Cup;Straw   Medication Administration: Crushed meds in puree   Supervision: Full supervision/cueing for compensatory strategies;Patient able to self feed   Compensations: Slow rate;Small sips/bites;Clear throat intermittently;Minimize environmental distractions   Postural Changes: Seated upright at 90 degrees   Oral Care Recommendations: Oral care BID   Jazzlin Clements B. Quentin Ore, [facility], [place] Speech Language Pathologist Office: [phone]  [place], [place] 03/06/2022,1:56 PM

## 2022-03-06 NOTE — Discharge Summary (Signed)
Physician Discharge Summary  Cassandra Dodson XBM:[id number] DOB: August 20, 1940 DOA: 03/01/2022  PCP: Shon Baton, MD  Admit date: 03/01/2022 Discharge date: 03/06/2022  Admitted From: SNF Disposition: Same  Recommendations for Outpatient Follow-up:  Follow up with PCP in 1-2 weeks Follow-up with neurology as scheduled  Discharge Condition: Stable CODE STATUS: Full Diet recommendation: Low-salt low-fat low-carb diet as tolerated  Brief/Interim Summary: 81 year old woman from [facility] PMH CVA with residual left-sided deficits and dysphagia, and GI, followed by neurosurgery, presented with lethargy and altered mental status.  Admitted to hospitalist, neurology consulted.  Discharge Diagnoses:  Principal Problem:   CVA (cerebral vascular accident) ([place]) Active Problems:   History of CVA with residual deficit   History of pulmonary embolism   Chronic anticoagulation   Normocytic anemia   Meningioma (HCC)   Endometrial cancer (HCC)   Diabetes mellitus with hyperglycemia (HCC)  Acute right PCA stroke with petechial hemorrhagic conversion, early subacute/acute infarct right frontoparietal white matter, likely caused by PCA stenosis, small infarct left cerebellar hemisphere age-indeterminate, PMH CVA with residual left-sided weakness -Continue management per neurology.   -Repeat CT without obvious hemorrhagic transformation.  -Continue Eliquis per discussion with neurology.  Continue aspirin   PMH PE on chronic anticoagulation --During hospitalization in October patient required intubation after being found to be hypoxic with saddle PE.   - Continue eliquis.   IDDM type 2, well controlled -A1C 5.5 -Continue previous insulin regimen   Essential HTN -- Stable.   Normocytic anemia --Chronic.  Hemoglobin 10.1 g/dL which appears improved from prior. 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 --Continue outpatient follow-up with neurosurgery   Endometrial cancer --Continue outpatient follow-up with gynecologic oncology  Discharge Instructions  Discharge Instructions     Ambulatory referral to Neurology   Complete by: As directed    Follow up with stroke clinic NP (Jessica Vanschaick or Cecille Rubin, if both not available, consider Zachery Dauer, or Ahern) at [facility] in about 4 weeks. Thanks.      Allergies as of 03/06/2022       Reactions   Anesthetics, Amide Nausea And Vomiting   Pt is intolerant to general anesthesia. Pt will throw up and has thrown up during procedure.    Ether Nausea And Vomiting        Medication List     STOP taking these medications    amLODipine 5 MG tablet Commonly known as: NORVASC   calcium carbonate 500 MG chewable tablet Commonly known as: TUMS - dosed in mg elemental calcium   metoprolol tartrate 25 MG tablet Commonly known as: LOPRESSOR       TAKE these medications    antiseptic oral rinse Liqd 15 mLs by Mouth Rinse route daily as needed for dry mouth.   apixaban 5 MG Tabs tablet Commonly known as: ELIQUIS Take 1 tablet (5 mg total) by mouth 2 (two) times daily.   aspirin EC 81 MG tablet Take 1 tablet (81 mg total) by mouth daily. Swallow whole. Start taking on: March 07, 2022   bisacodyl 10 MG suppository Commonly known as: DULCOLAX Place 10 mg rectally daily as needed (constipation not relieved by Milk of Magnesia).   Desitin 13 % Crea Generic drug: Zinc Oxide Apply 1 application  topically 2 (two) times daily. To buttocks for protection. What changed: additional instructions  docusate sodium 100 MG capsule Commonly known as: COLACE Take 1 capsule (100 mg total) by mouth 2 (two) times daily.   fenofibrate 160 MG tablet Take 1 tablet (160 mg total) by mouth daily after  breakfast. What changed: when to take this   folic acid 1 MG tablet Commonly known as: FOLVITE Take 1 tablet (1 mg total) by mouth daily.   insulin glargine 100 UNIT/ML Solostar Pen Commonly known as: LANTUS Inject 15 Units into the skin daily. What changed: when to take this   insulin lispro 100 UNIT/ML KwikPen Commonly known as: HUMALOG Inject 3 Units into the skin 3 (three) times daily with meals. What changed: when to take this   methylphenidate 5 MG tablet Commonly known as: RITALIN Take 1 tablet (5 mg total) by mouth 2 (two) times daily with breakfast and lunch. What changed: when to take this   MILK OF MAGNESIA PO Take 30 mLs by mouth daily as needed (constipation).   NUTRITIONAL DRINK MIX PO Take 1 Dose by mouth 3 (three) times daily. Magic Cup   pantoprazole 40 MG tablet Commonly known as: PROTONIX Take 1 tablet (40 mg total) by mouth daily. What changed: when to take this   Pentips 32G X 4 MM Misc Generic drug: Insulin Pen Needle Use daily in the morning, at noon, and at bedtime.   RA SALINE ENEMA RE Place 1 enema rectally daily as needed (constipation not relieved by bisacodyl suppository).   rosuvastatin 20 MG tablet Commonly known as: CRESTOR Take 1 tablet (20 mg total) by mouth daily. What changed: when to take this   senna 8.6 MG Tabs tablet Commonly known as: SENOKOT Take 1.5 tablets (12.9 mg total) by mouth at bedtime as needed. What changed: reasons to take this        Follow-up Information     [facility]. Schedule an appointment as soon as possible for a visit in 1 month(s).   Specialty: Neurology Why: stroke clinic Contact information: [place] [phone]               Allergies  Allergen Reactions   Anesthetics, Amide Nausea And Vomiting    Pt is intolerant to general anesthesia. Pt will throw up and has thrown up during procedure.    Ether Nausea And  Vomiting    Consultations: Neurology  Procedures/Studies: CT HEAD WO CONTRAST (5MM)  Result Date: 03/05/2022 CLINICAL DATA:  Stroke follow-up EXAM: CT HEAD WITHOUT CONTRAST TECHNIQUE: Contiguous axial images were obtained from the base of the skull through the vertex without intravenous contrast. RADIATION DOSE REDUCTION: This exam was performed according to the departmental dose-optimization program which includes automated exposure control, adjustment of the mA and/or kV according to patient size and/or use of iterative reconstruction technique. COMPARISON:  Four days ago FINDINGS: Brain: Recent right occipital lobe infarct without suspected progression. Mild petechial hemorrhage at the acute infarct, no interval hematoma. Large mildly dense planum sphenoidale meningioma with local mass effect and left frontal edema, reference brain MRI. Chronic infarcts are seen in the bilateral cerebellum and right pons. Motion artifact Vascular: Atheromatous calcification and basilar stent. Skull: No acute finding Sinuses/Orbits: No acute finding IMPRESSION: 1. Acute right occipital infarct without progression from 4 days ago. 2. Chronic small vessel ischemia and posterior circulation infarcts. 3. Large planum sphenoidale meningioma with vasogenic edema. Electronically Signed   By: Jorje Guild M.D.   On: 03/05/2022 05:35   VAS [place] UPPER EXTREMITY  VENOUS DUPLEX  Result Date: 03/02/2022 UPPER VENOUS STUDY  Patient Name:  Cassandra Dodson  Date of Exam:   03/02/2022 Medical Rec #: [id number]     Accession #:    [id number] Date of Birth: [birth year]     Patient Gender: F Patient Age:   40 years Exam Location:  [facility] Procedure:      VAS [place] UPPER EXTREMITY VENOUS DUPLEX Referring Phys: Cornelius Moras XU --------------------------------------------------------------------------------  Indications: History of PE, asymmetrical left arm swelling Limitations: Patient somnolence, limited ability to reposition, overlying  subcutaneous edema, size and depth of vessels. Comparison Study: No prior studies. Performing Technologist: Darlin Coco RDMS, RVT  Examination Guidelines: A complete evaluation includes B-mode imaging, spectral Doppler, color Doppler, and power Doppler as needed of all accessible portions of each vessel. Bilateral testing is considered an integral part of a complete examination. Limited examinations for reoccurring indications may be performed as noted.  Right Findings: +----------+------------+---------+-----------+----------+-------+ RIGHT     CompressiblePhasicitySpontaneousPropertiesSummary +----------+------------+---------+-----------+----------+-------+ Subclavian               Yes       Yes                      +----------+------------+---------+-----------+----------+-------+  Left Findings: +----------+------------+---------+-----------+----------+---------------------+ LEFT      CompressiblePhasicitySpontaneousProperties       Summary        +----------+------------+---------+-----------+----------+---------------------+ IJV           Full       Yes       Yes                                    +----------+------------+---------+-----------+----------+---------------------+ Subclavian               Yes       Yes                                    +----------+------------+---------+-----------+----------+---------------------+ Axillary      Full       Yes       Yes                                    +----------+------------+---------+-----------+----------+---------------------+ Brachial      Full                                    Some segments not                                                          well visualized    +----------+------------+---------+-----------+----------+---------------------+ Radial        Full                                                         +----------+------------+---------+-----------+----------+---------------------+ Ulnar  Full                                                        +----------+------------+---------+-----------+----------+---------------------+ Cephalic      Full                                                        +----------+------------+---------+-----------+----------+---------------------+ Basilic       Full                                                        +----------+------------+---------+-----------+----------+---------------------+  Summary:  Right: No evidence of thrombosis in the subclavian.  Left: No evidence of deep vein thrombosis in the upper extremity. No evidence of superficial vein thrombosis in the upper extremity.  *See table(s) above for measurements and observations.  Diagnosing physician: Monica Martinez MD Electronically signed by Monica Martinez MD on 03/02/2022 at 4:15:32 PM.    Final    CT ANGIO HEAD NECK W WO CM  Result Date: 03/01/2022 CLINICAL DATA:  Infarcts on same-day MRI, increased lethargy and altered mental status EXAM: CT ANGIOGRAPHY HEAD AND NECK TECHNIQUE: Multidetector CT imaging of the head and neck was performed using the standard protocol during bolus administration of intravenous contrast. Multiplanar CT image reconstructions and MIPs were obtained to evaluate the vascular anatomy. Carotid stenosis measurements (when applicable) are obtained utilizing NASCET criteria, using the distal internal carotid diameter as the denominator. RADIATION DOSE REDUCTION: This exam was performed according to the departmental dose-optimization program which includes automated exposure control, adjustment of the mA and/or kV according to patient size and/or use of iterative reconstruction technique. CONTRAST:  62m OMNIPAQUE IOHEXOL 350 MG/ML SOLN COMPARISON:  01/16/2022 CTA head; correlation is also made with CT head 03/01/2022 and MRI head 03/01/2022 FINDINGS:  CT HEAD FINDINGS Brain: Redemonstrated acute to early subacute right PCA territory infarct in the posterior right temporal and occipital lobes, with petechial hemorrhage. Mild local mass effect with effacement of the sulci and narrowing of the right lateral ventricle atrium and occipital horn. 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 Unchanged associated mass effect and surrounding edema with 1.6 cm left-to-right midline shift at the level of the anterior falx. The punctate infarct in the right frontoparietal white matter is not appreciated on this exam. No evidence of additional acute infarct or hemorrhage. No hydrocephalus or extra-axial fluid collection. Periventricular white matter changes, likely the sequela of chronic small vessel ischemic disease. Vascular: No hyperdense vessel. Atherosclerotic calcifications in the intracranial carotid and vertebral arteries. Basilar artery stent. Skull: Normal. Negative for fracture or focal lesion. Sinuses/Orbits: Clear paranasal sinuses. The orbits are unremarkable. Other: The mastoid air cells are well aerated. CTA NECK FINDINGS Aortic arch: Two-vessel arch with a common origin of the brachiocephalic and left common carotid arteries. Imaged portion shows no evidence  of aneurysm or dissection. No significant stenosis of the major arch vessel origins. Aortic atherosclerosis. Right carotid system: No evidence of dissection, occlusion, or hemodynamically significant stenosis (greater than 50%). Atherosclerotic disease at the bifurcation and in the proximal ICA is not hemodynamically significant. Left carotid system: Approximately 50% stenosis in the proximal left ICA. No evidence of dissection or occlusion. Vertebral arteries: No evidence of dissection, occlusion, or hemodynamically  significant stenosis (greater than 50%). Skeleton: No acute osseous abnormality. Other neck: Negative. Upper chest: No focal pulmonary opacity or pleural effusion. Emphysema. Review of the MIP images confirms the above findings CTA HEAD FINDINGS Anterior circulation: Both internal carotid arteries are patent to the termini, with mild stenosis in the bilateral cavernous segments. A1 segments patent, with mild focal stenosis in the proximal right A1 and proximal to mid left A1. Normal anterior communicating artery. Anterior cerebral arteries are patent to their distal aspects. No M1 stenosis or occlusion. MCA branches perfused and symmetric. Posterior circulation: At least moderate stenosis throughout the left V4 segment, with focal near occlusion distally (series 10, image 217). The right vertebral artery is patent to the vertebrobasilar junction. Posterior inferior cerebellar arteries patent proximally. Redemonstrated stent within the distal basilar artery, which limits evaluation for patency. There is moderate stenosis just proximal to the stent (series 10, image 237). Basilar patent to its distal aspect. Superior cerebellar arteries patent proximally. Multifocal narrowing in the right P1, with additional more significant moderate stenosis at the right P1/P2 junction (series 10, image 254) and in the mid to distal A2 (series 10, image 248), and severe stenosis in the proximal right P3 (series 10, image 250). The left P1 is not well visualized and may be hypoplastic or stenosed. Fetal or near fetal origin of the left PCA from a prominent posterior communicating artery. The right posterior communicating artery is not visualized. The posterior circulation appears overall similar to the prior exam. Venous sinuses: As permitted by contrast timing, patent. Anatomic variants: Fetal or near fetal origin of the left PCA. Review of the MIP images confirms the above findings IMPRESSION: 1. Redemonstrated acute to early  subacute right PCA territory infarct, with petechial hemorrhage and mild local mass effect. 2. Multifocal narrowing in the posterior circulation, with near occlusion in the distal left V4 and right PCA and severe stenosis in the proximal right P3, as well as moderate narrowing in the remainder of the right PCA and left V4. In addition, there is moderate stenosis just proximal to the stent in the distal basilar artery. These stenoses appear unchanged from the prior exam. 3. Mild stenosis in the bilateral cavernous ICAs and proximal right A1 and proximal to mid left. 4. Approximately 50% stenosis in the proximal left ICA. No other hemodynamically significant stenosis in the neck. 5. Large planum sphenoidale meningioma, with associated mass effect and 1.6 cm left-to-right midline shift. 6. Aortic atherosclerosis and emphysema. Aortic Atherosclerosis (ICD10-I70.0) and Emphysema (ICD10-J43.9). Electronically Signed   By: Merilyn Baba M.D.   On: 03/01/2022 19:56   MR BRAIN WO CONTRAST  Result Date: 03/01/2022 CLINICAL DATA:  Provided history: Stroke, follow-up. EXAM: MRI HEAD WITHOUT CONTRAST TECHNIQUE: Multiplanar, multiecho pulse sequences of the brain and surrounding structures were obtained without intravenous contrast. COMPARISON:  Head CT performed earlier today 03/01/2022. Brain MRI 01/13/2022. CTA head 01/16/2022. FINDINGS: Intermittently motion degraded examination, limiting evaluation. Most notably, the sagittal T1 weighted sequence is severely motion degraded, the axial T2 sequence is severely motion degraded, the axial T1 weighted sequence is severely  motion degraded and the coronal T2 sequence is severely motion degraded. Brain: Mild generalized cerebral atrophy. 5.5 x 4.0 cm focus of cortical and subcortical restricted diffusion and heterogeneous signal abnormality within the right temporal and occipital lobes, compatible with an early subacute infarct. Ill-defined hemorrhagic conversion within  portions of the infarction territory. Local mass effect with partial effacement of the right lateral ventricle at the level of the anterior falx 3 mm acute/early subacute infarct within the right frontoparietal white matter (for instance as seen on series 2, image 35). Redemonstrated tiny chronic cortical infarct within the high right parietal lobe (series 6, image 19). Chronic infarcts within the right corona radiata/caudate nucleus, left corona radiata, left thalamus, right aspect of the pons and within the bilateral cerebellar hemispheres. Background moderate multifocal T2 FLAIR hyperintensity within the cerebral white matter, nonspecific but compatible with chronic small vessel disease. Chronic small vessel ischemic changes are also present within the bilateral middle cerebellar peduncles. Redemonstrated large planum sphenoidale meningioma, measuring 5.0 x 3.8 cm in transaxial dimensions. As before, there is significant regional mass effect with moderate vasogenic edema in the adjacent anterior left frontal lobe, and 1.5 cm rightward midline shift measured at the anterior falx. No extra-axial fluid collection. Vascular: There is signal abnormality within the distal intracranial left vertebral artery, and correlating with the prior CTA head of 01/16/2022, this likely reflects focal vessel occlusion. Basilar artery stent better appreciated on the head CT performed earlier today. Skull and upper cervical spine: Within the limitations of motion degradation, no focal suspicious marrow lesion is identified. Sinuses/Orbits: No mass or acute finding within the imaged orbits. Trace mucosal thickening within the bilateral ethmoid air cells. Other: Trace fluid within the right mastoid air cells. IMPRESSION: Intermittently motion degraded examination, limiting evaluation. 5.5 x 4.0 cm subacute cortical and subcortical right PCA territory infarct with ill-defined hemorrhagic conversion. Resultant local mass effect with  partial effacement of the right lateral ventricle. 3 mm acute/early subacute infarct within the right frontoparietal white matter. Redemonstrated large planum sphenoidale meningioma with significant regional mass effect, moderate vasogenic edema in the adjacent anterior left frontal lobe and 1.5 cm rightward midline shift at the level of the anterior falx. Background cerebral atrophy, chronic small vessel ischemic disease and chronic infarcts, as detailed. Probable focal occlusion of the distal V4 left vertebral artery, as was demonstrated on the prior CTA head of 01/16/2022. Electronically Signed   By: Kellie Simmering D.O.   On: 03/01/2022 15:28   DG Chest Portable 1 View  Result Date: 03/01/2022 CLINICAL DATA:  Altered mental status. EXAM: PORTABLE CHEST 1 VIEW COMPARISON:  January 30, 2022. FINDINGS: The heart size and mediastinal contours are within normal limits. Both lungs are clear. The visualized skeletal structures are unremarkable. IMPRESSION: No active disease. Electronically Signed   By: Marijo Conception M.D.   On: 03/01/2022 11:32   CT Head Wo Contrast  Result Date: 03/01/2022 CLINICAL DATA:  Provided history: Mental status change, unknown cause. Increased lethargy. Altered mental status. EXAM: CT HEAD WITHOUT CONTRAST TECHNIQUE: Contiguous axial images were obtained from the base of the skull through the vertex without intravenous contrast. RADIATION DOSE REDUCTION: This exam was performed according to the departmental dose-optimization program which includes automated exposure control, adjustment of the mA and/or kV according to patient size and/or use of iterative reconstruction technique. COMPARISON:  Prior head CT examinations 01/26/2022 and earlier. Brain MRI 01/14/2019 FINDINGS: Brain: Mild generalized cerebral atrophy. Acute/early subacute appearing cortical and subcortical right PCA territory infarct  within portions of the right temporal and occipital lobes, measuring 6.5 x 3.3 cm in  transaxial dimensions. Curvilinear foci of hyperdensity within the infarction territory compatible with petechial hemorrhage. Local mass effect with partial effacement of the atrium, occipital horn and temporal horn of the right lateral ventricle. 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 Unchanged from the prior head CT of 01/26/2022, this measures 5.0 x 3.8 cm in transaxial dimensions. Also unchanged, there is regional mass effect, moderate vasogenic edema within the underlying anterior left frontal lobe and 1.5 cm rightward midline shift at the level of the anterior falx. Background moderate chronic small vessel ischemic changes within the cerebral white matter. Chronic small vessel ischemic changes are also present within the pons, bilateral middle cerebellar peduncles and bilateral cerebellar hemispheres. Redemonstrated chronic infarcts within the right corona radiata/caudate nucleus, left corona radiata, left thalamus, within the right aspect of the pons and within the right cerebellar hemisphere. 1.6 x 0.6 cm infarct within the left cerebellar hemisphere, not definitively present on prior examinations and age-indeterminate. No extra-axial fluid collection. Vascular: No hyperdense vessel. Basilar artery stent. Atherosclerotic calcifications. Skull: No fracture or aggressive osseous lesion. Sinuses/Orbits: No mass or acute finding within the imaged orbits. Small-volume frothy secretions within a posterior right ethmoid air cell. Small-volume fluid within a posterior left ethmoid air cell. Impression #1 called by telephone at the time of interpretation on 03/01/2022 at 11:29 am to provider ADAM CURATOLO , who verbally acknowledged these results. IMPRESSION: Acute/early subacute appearing cortical and subcortical right PCA territory infarct within the right temporal and occipital lobes. Petechial hemorrhage within the infarction territory. Local mass effect with partial effacement the right  lateral ventricle. 1.6 cm infarct within the left cerebellar hemisphere, not definitively present on prior exams and age-indeterminate, possibly acute/subacute. Background cerebral atrophy, chronic small vessel ischemic disease and chronic infarcts as described. 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 Mild bilateral ethmoid sinusitis. Electronically Signed   By: Kellie Simmering D.O.   On: 03/01/2022 11:31     Subjective: No acute issues or events overnight   Discharge Exam: Vitals:   03/06/22 0357 03/06/22 0739  BP: 115/60 123/72  Pulse: 100 91  Resp: 16 16  Temp: 99.5 F (37.5 C) 98.2 F (36.8 C)  SpO2: 100% 100%   Vitals:   03/05/22 1745 03/06/22 0016 03/06/22 0357 03/06/22 0739  BP: 133/83 134/67 115/60 123/72  Pulse: 61 (!) 108 100 91  Resp: '17 16 16 16  '$ Temp: 98.8 F (37.1 C) 98.2 F (36.8 C) 99.5 F (37.5 C) 98.2 F (36.8 C)  TempSrc: Oral Oral Oral Oral  SpO2: 96% 100% 100% 100%  Weight:      Height:        General: Pt is alert, awake, not in acute distress Cardiovascular: RRR, S1/S2 +, no rubs, no gallops Respiratory: CTA bilaterally, no wheezing, no rhonchi Abdominal: Soft, NT, ND, bowel sounds + Extremities: Left-sided hemiplegia, right side intact    The results of significant diagnostics from this hospitalization (including imaging, microbiology, ancillary and laboratory) are listed below for reference.     Microbiology: Recent Results (from the past 240 hour(s))  Urine Culture     Status: Abnormal   Collection Time: 03/01/22 10:23 AM   Specimen: In/Out Cath Urine  Result Value Ref Range Status   Specimen Description IN/OUT CATH  URINE  Final   Special Requests   Final    NONE Performed at [facility], [place] [address] [place], [place] [address]    Culture (A)  Final     >=100,000 COLONIES/mL ACINETOBACTER CALCOACETICUS/BAUMANNII COMPLEX >=100,000 COLONIES/mL ESCHERICHIA COLI    Report Status 03/04/2022 FINAL  Final   Organism ID, Bacteria ACINETOBACTER CALCOACETICUS/BAUMANNII COMPLEX (A)  Final   Organism ID, Bacteria ESCHERICHIA COLI (A)  Final      Susceptibility   Acinetobacter calcoaceticus/baumannii complex - MIC*    CEFTAZIDIME 4 SENSITIVE Sensitive     CIPROFLOXACIN <=0.25 SENSITIVE Sensitive     GENTAMICIN <=1 SENSITIVE Sensitive     IMIPENEM <=0.25 SENSITIVE Sensitive     PIP/TAZO <=4 SENSITIVE Sensitive     TRIMETH/SULFA <=20 SENSITIVE Sensitive     AMPICILLIN/SULBACTAM <=2 SENSITIVE Sensitive     * >=100,000 COLONIES/mL ACINETOBACTER CALCOACETICUS/BAUMANNII COMPLEX   Escherichia coli - MIC*    AMPICILLIN 8 SENSITIVE Sensitive     CEFAZOLIN <=4 SENSITIVE Sensitive     CEFEPIME <=0.12 SENSITIVE Sensitive     CEFTRIAXONE <=0.25 SENSITIVE Sensitive     CIPROFLOXACIN <=0.25 SENSITIVE Sensitive     GENTAMICIN <=1 SENSITIVE Sensitive     IMIPENEM <=0.25 SENSITIVE Sensitive     NITROFURANTOIN 32 SENSITIVE Sensitive     TRIMETH/SULFA <=20 SENSITIVE Sensitive     AMPICILLIN/SULBACTAM 4 SENSITIVE Sensitive     PIP/TAZO <=4 SENSITIVE Sensitive     * >=100,000 COLONIES/mL ESCHERICHIA COLI  Resp Panel by RT-PCR (Flu A&B, Covid) Anterior Nasal Swab     Status: None   Collection Time: 03/01/22 10:23 AM   Specimen: Anterior Nasal Swab  Result Value Ref Range Status   SARS Coronavirus 2 by RT PCR NEGATIVE NEGATIVE Final    Comment: (NOTE) SARS-CoV-2 target nucleic acids are NOT DETECTED.  The SARS-CoV-2 RNA is generally detectable in upper respiratory specimens during the acute phase of infection. The lowest concentration of SARS-CoV-2 viral copies this assay can detect is 138 copies/mL. A negative result does not preclude SARS-Cov-2 infection and should not be used as the sole basis for treatment or other patient management decisions. A negative  result may occur with  improper specimen collection/handling, submission of specimen other than nasopharyngeal swab, presence of viral mutation(s) within the areas targeted by this assay, and inadequate number of viral copies(<138 copies/mL). A negative result must be combined with clinical observations, patient history, and epidemiological information. The expected result is Negative.  Fact Sheet for Patients:  EntrepreneurPulse.com.au  Fact Sheet for Healthcare Providers:  IncredibleEmployment.be  This test is no t yet approved or cleared by the [place] FDA and  has been authorized for detection and/or diagnosis of SARS-CoV-2 by FDA under an Emergency Use Authorization (EUA). This EUA will remain  in effect (meaning this test can be used) for the duration of the COVID-19 declaration under Section 564(b)(1) of the Act, 21 U.S.C.section 360bbb-3(b)(1), unless the authorization is terminated  or revoked sooner.       Influenza A by PCR NEGATIVE NEGATIVE Final   Influenza B by PCR NEGATIVE NEGATIVE Final    Comment: (NOTE) The Xpert Xpress SARS-CoV-2/FLU/RSV plus assay is intended as an aid in the diagnosis of influenza from Nasopharyngeal swab specimens and should not be used as a sole basis for treatment. Nasal washings and aspirates are unacceptable for Xpert Xpress SARS-CoV-2/FLU/RSV testing.  Fact Sheet for Patients: EntrepreneurPulse.com.au  Fact Sheet for Healthcare Providers: IncredibleEmployment.be  This test is not  yet approved or cleared by the [place] and has been authorized for detection and/or diagnosis of SARS-CoV-2 by FDA under an Emergency Use Authorization (EUA). This EUA will remain in effect (meaning this test can be used) for the duration of the COVID-19 declaration under Section 564(b)(1) of the Act, 21 U.S.C. section 360bbb-3(b)(1), unless the authorization is  terminated or revoked.  Performed at [facility], [place] [address], [place], [place] [address]   Blood culture (routine x 2)     Status: None   Collection Time: 03/01/22 10:35 AM   Specimen: BLOOD RIGHT HAND  Result Value Ref Range Status   Specimen Description BLOOD RIGHT HAND  Final   Special Requests   Final    BOTTLES DRAWN AEROBIC AND ANAEROBIC Blood Culture results may not be optimal due to an inadequate volume of blood received in culture bottles   Culture   Final    NO GROWTH 5 DAYS Performed at [facility], [place] [address], [place], [place] [address]    Report Status 03/06/2022 FINAL  Final  Blood culture (routine x 2)     Status: None   Collection Time: 03/01/22 10:40 AM   Specimen: BLOOD RIGHT ARM  Result Value Ref Range Status   Specimen Description BLOOD RIGHT ARM  Final   Special Requests   Final    BOTTLES DRAWN AEROBIC AND ANAEROBIC Blood Culture results may not be optimal due to an inadequate volume of blood received in culture bottles   Culture   Final    NO GROWTH 5 DAYS Performed at [facility], [place] [address], [place], [place] [address]    Report Status 03/06/2022 FINAL  Final     Labs: BNP (last 3 results) Recent Labs    01/26/22 1452  BNP 947.6*   Basic Metabolic Panel: Recent Labs  Lab 03/01/22 1024 03/02/22 0238 03/05/22 1010  NA 133* 131* 134*  K 4.4 3.7 3.8  CL 100 101 100  CO2 '23 23 25  '$ GLUCOSE 130* 215* 133*  BUN '14 15 9  '$ CREATININE 0.91 0.85 0.59  CALCIUM 9.2 8.6* 9.1  MG  --   --  1.6*  PHOS  --   --  2.5   Liver Function Tests: Recent Labs  Lab 03/01/22 1024  AST 21  ALT 8  ALKPHOS 54  BILITOT 0.9  PROT 5.5*  ALBUMIN 2.6*   Recent Labs  Lab 03/01/22 1024  LIPASE 20   No results for input(s): "AMMONIA" in the last 168 hours. CBC: Recent Labs  Lab 03/01/22 1024 03/02/22 0238  WBC 6.1 4.8  NEUTROABS 3.8  --   HGB 10.1* 9.4*  HCT 33.0* 30.4*  MCV 95.4 94.7  PLT 169 151   Cardiac  Enzymes: No results for input(s): "CKTOTAL", "CKMB", "CKMBINDEX", "TROPONINI" in the last 168 hours. BNP: Invalid input(s): "POCBNP" CBG: Recent Labs  Lab 03/05/22 1339 03/05/22 1749 03/05/22 2036 03/05/22 2130 03/06/22 0650  GLUCAP 155* 190* 198* 186* 168*   D-Dimer No results for input(s): "DDIMER" in the last 72 hours. Hgb A1c No results for input(s): "HGBA1C" in the last 72 hours. Lipid Profile No results for input(s): "CHOL", "HDL", "LDLCALC", "TRIG", "CHOLHDL", "LDLDIRECT" in the last 72 hours. Thyroid function studies No results for input(s): "TSH", "T4TOTAL", "T3FREE", "THYROIDAB" in the last 72 hours.  Invalid input(s): "FREET3" Anemia work up No results for input(s): "VITAMINB12", "FOLATE", "FERRITIN", "TIBC", "IRON", "RETICCTPCT" in the last 72 hours. Urinalysis  Component Value Date/Time   COLORURINE YELLOW 03/01/2022 1023   APPEARANCEUR CLOUDY (A) 03/01/2022 1023   LABSPEC 1.019 03/01/2022 1023   PHURINE 7.0 03/01/2022 1023   GLUCOSEU NEGATIVE 03/01/2022 1023   HGBUR NEGATIVE 03/01/2022 1023   BILIRUBINUR NEGATIVE 03/01/2022 1023   KETONESUR NEGATIVE 03/01/2022 1023   PROTEINUR 30 (A) 03/01/2022 1023   UROBILINOGEN 0.2 02/13/2015 1305   NITRITE NEGATIVE 03/01/2022 1023   LEUKOCYTESUR LARGE (A) 03/01/2022 1023   Sepsis Labs Recent Labs  Lab 03/01/22 1024 03/02/22 0238  WBC 6.1 4.8   Microbiology Recent Results (from the past 240 hour(s))  Urine Culture     Status: Abnormal   Collection Time: 03/01/22 10:23 AM   Specimen: In/Out Cath Urine  Result Value Ref Range Status   Specimen Description IN/OUT CATH URINE  Final   Special Requests   Final    NONE Performed at [facility], [place] [address], [place], [place] [address]    Culture (A)  Final    >=100,000 COLONIES/mL ACINETOBACTER CALCOACETICUS/BAUMANNII COMPLEX >=100,000 COLONIES/mL ESCHERICHIA COLI    Report Status 03/04/2022 FINAL  Final   Organism ID, Bacteria ACINETOBACTER  CALCOACETICUS/BAUMANNII COMPLEX (A)  Final   Organism ID, Bacteria ESCHERICHIA COLI (A)  Final      Susceptibility   Acinetobacter calcoaceticus/baumannii complex - MIC*    CEFTAZIDIME 4 SENSITIVE Sensitive     CIPROFLOXACIN <=0.25 SENSITIVE Sensitive     GENTAMICIN <=1 SENSITIVE Sensitive     IMIPENEM <=0.25 SENSITIVE Sensitive     PIP/TAZO <=4 SENSITIVE Sensitive     TRIMETH/SULFA <=20 SENSITIVE Sensitive     AMPICILLIN/SULBACTAM <=2 SENSITIVE Sensitive     * >=100,000 COLONIES/mL ACINETOBACTER CALCOACETICUS/BAUMANNII COMPLEX   Escherichia coli - MIC*    AMPICILLIN 8 SENSITIVE Sensitive     CEFAZOLIN <=4 SENSITIVE Sensitive     CEFEPIME <=0.12 SENSITIVE Sensitive     CEFTRIAXONE <=0.25 SENSITIVE Sensitive     CIPROFLOXACIN <=0.25 SENSITIVE Sensitive     GENTAMICIN <=1 SENSITIVE Sensitive     IMIPENEM <=0.25 SENSITIVE Sensitive     NITROFURANTOIN 32 SENSITIVE Sensitive     TRIMETH/SULFA <=20 SENSITIVE Sensitive     AMPICILLIN/SULBACTAM 4 SENSITIVE Sensitive     PIP/TAZO <=4 SENSITIVE Sensitive     * >=100,000 COLONIES/mL ESCHERICHIA COLI  Resp Panel by RT-PCR (Flu A&B, Covid) Anterior Nasal Swab     Status: None   Collection Time: 03/01/22 10:23 AM   Specimen: Anterior Nasal Swab  Result Value Ref Range Status   SARS Coronavirus 2 by RT PCR NEGATIVE NEGATIVE Final    Comment: (NOTE) SARS-CoV-2 target nucleic acids are NOT DETECTED.  The SARS-CoV-2 RNA is generally detectable in upper respiratory specimens during the acute phase of infection. The lowest concentration of SARS-CoV-2 viral copies this assay can detect is 138 copies/mL. A negative result does not preclude SARS-Cov-2 infection and should not be used as the sole basis for treatment or other patient management decisions. A negative result may occur with  improper specimen collection/handling, submission of specimen other than nasopharyngeal swab, presence of viral mutation(s) within the areas targeted by this assay,  and inadequate number of viral copies(<138 copies/mL). A negative result must be combined with clinical observations, patient history, and epidemiological information. The expected result is Negative.  Fact Sheet for Patients:  EntrepreneurPulse.com.au  Fact Sheet for Healthcare Providers:  IncredibleEmployment.be  This test is no t yet approved or cleared by the [place] and  has been authorized for  detection and/or diagnosis of SARS-CoV-2 by FDA under an Emergency Use Authorization (EUA). This EUA will remain  in effect (meaning this test can be used) for the duration of the COVID-19 declaration under Section 564(b)(1) of the Act, 21 U.S.C.section 360bbb-3(b)(1), unless the authorization is terminated  or revoked sooner.       Influenza A by PCR NEGATIVE NEGATIVE Final   Influenza B by PCR NEGATIVE NEGATIVE Final    Comment: (NOTE) The Xpert Xpress SARS-CoV-2/FLU/RSV plus assay is intended as an aid in the diagnosis of influenza from Nasopharyngeal swab specimens and should not be used as a sole basis for treatment. Nasal washings and aspirates are unacceptable for Xpert Xpress SARS-CoV-2/FLU/RSV testing.  Fact Sheet for Patients: EntrepreneurPulse.com.au  Fact Sheet for Healthcare Providers: IncredibleEmployment.be  This test is not yet approved or cleared by the [place] FDA and has been authorized for detection and/or diagnosis of SARS-CoV-2 by FDA under an Emergency Use Authorization (EUA). This EUA will remain in effect (meaning this test can be used) for the duration of the COVID-19 declaration under Section 564(b)(1) of the Act, 21 U.S.C. section 360bbb-3(b)(1), unless the authorization is terminated or revoked.  Performed at [facility], [place] [address], [place], [place] [address]   Blood culture (routine x 2)     Status: None   Collection Time: 03/01/22 10:35 AM    Specimen: BLOOD RIGHT HAND  Result Value Ref Range Status   Specimen Description BLOOD RIGHT HAND  Final   Special Requests   Final    BOTTLES DRAWN AEROBIC AND ANAEROBIC Blood Culture results may not be optimal due to an inadequate volume of blood received in culture bottles   Culture   Final    NO GROWTH 5 DAYS Performed at [facility], [place] [address] [place], [place] [address]    Report Status 03/06/2022 FINAL  Final  Blood culture (routine x 2)     Status: None   Collection Time: 03/01/22 10:40 AM   Specimen: BLOOD RIGHT ARM  Result Value Ref Range Status   Specimen Description BLOOD RIGHT ARM  Final   Special Requests   Final    BOTTLES DRAWN AEROBIC AND ANAEROBIC Blood Culture results may not be optimal due to an inadequate volume of blood received in culture bottles   Culture   Final    NO GROWTH 5 DAYS Performed at [facility], [place] [address] [place], [place] [address]    Report Status 03/06/2022 FINAL  Final     Time coordinating discharge: Over 30 minutes  SIGNED:   Little Ishikawa, DO [facility] 03/06/2022, 11:57 AM Pager   If 7PM-7AM, please contact night-coverage [email or web address]

## 2022-03-06 NOTE — Plan of Care (Signed)
  Problem: Education: Goal: Knowledge of disease or condition will improve Outcome: Adequate for Discharge Goal: Knowledge of secondary prevention will improve (MUST DOCUMENT ALL) Outcome: Adequate for Discharge Goal: Knowledge of patient specific risk factors will improve [name] N/A or DELETE if not current risk factor) Outcome: Adequate for Discharge   Problem: Ischemic Stroke/TIA Tissue Perfusion: Goal: Complications of ischemic stroke/TIA will be minimized Outcome: Adequate for Discharge   Problem: Coping: Goal: Will verbalize positive feelings about self Outcome: Adequate for Discharge Goal: Will identify appropriate support needs Outcome: Adequate for Discharge   Problem: Health Behavior/Discharge Planning: Goal: Ability to manage health-related needs will improve Outcome: Adequate for Discharge Goal: Goals will be collaboratively established with patient/family Outcome: Adequate for Discharge   Problem: Self-Care: Goal: Ability to participate in self-care as condition permits will improve Outcome: Adequate for Discharge Goal: Verbalization of feelings and concerns over difficulty with self-care will improve Outcome: Adequate for Discharge Goal: Ability to communicate needs accurately will improve Outcome: Adequate for Discharge   Problem: Nutrition: Goal: Risk of aspiration will decrease Outcome: Adequate for Discharge Goal: Dietary intake will improve Outcome: Adequate for Discharge   Problem: Education: Goal: Knowledge of General Education information will improve Description: Including pain rating scale, medication(s)/side effects and non-pharmacologic comfort measures Outcome: Adequate for Discharge   Problem: Health Behavior/Discharge Planning: Goal: Ability to manage health-related needs will improve Outcome: Adequate for Discharge   Problem: Clinical Measurements: Goal: Ability to maintain clinical measurements within normal limits will improve Outcome:  Adequate for Discharge Goal: Will remain free from infection Outcome: Adequate for Discharge Goal: Diagnostic test results will improve Outcome: Adequate for Discharge Goal: Respiratory complications will improve Outcome: Adequate for Discharge Goal: Cardiovascular complication will be avoided Outcome: Adequate for Discharge   Problem: Activity: Goal: Risk for activity intolerance will decrease Outcome: Adequate for Discharge   Problem: Nutrition: Goal: Adequate nutrition will be maintained Outcome: Adequate for Discharge   Problem: Coping: Goal: Level of anxiety will decrease Outcome: Adequate for Discharge   Problem: Elimination: Goal: Will not experience complications related to bowel motility Outcome: Adequate for Discharge Goal: Will not experience complications related to urinary retention Outcome: Adequate for Discharge   Problem: Pain Managment: Goal: General experience of comfort will improve Outcome: Adequate for Discharge   Problem: Safety: Goal: Ability to remain free from injury will improve Outcome: Adequate for Discharge   Problem: Skin Integrity: Goal: Risk for impaired skin integrity will decrease Outcome: Adequate for Discharge   Problem: Education: Goal: Ability to describe self-care measures that may prevent or decrease complications (Diabetes Survival Skills Education) will improve Outcome: Adequate for Discharge Goal: Individualized Educational Video(s) Outcome: Adequate for Discharge   Problem: Coping: Goal: Ability to adjust to condition or change in health will improve Outcome: Adequate for Discharge   Problem: Fluid Volume: Goal: Ability to maintain a balanced intake and output will improve Outcome: Adequate for Discharge   Problem: Health Behavior/Discharge Planning: Goal: Ability to identify and utilize available resources and services will improve Outcome: Adequate for Discharge Goal: Ability to manage health-related needs will  improve Outcome: Adequate for Discharge   Problem: Metabolic: Goal: Ability to maintain appropriate glucose levels will improve Outcome: Adequate for Discharge   Problem: Nutritional: Goal: Maintenance of adequate nutrition will improve Outcome: Adequate for Discharge Goal: Progress toward achieving an optimal weight will improve Outcome: Adequate for Discharge

## 2022-04-09 NOTE — Progress Notes (Unsigned)
[facility] [address] [place]. [place] [address] [phone]       HOSPITAL FOLLOW UP NOTE  [name] Date of Birth:  [birth year] Medical Record Number:  [id number]   Reason for Referral:  hospital stroke follow up    SUBJECTIVE:   CHIEF COMPLAINT:  No chief complaint on file.   HPI:   [name] is a 81 y.o. female with history of hypertension, diabetes, endometrial cancer, PE in 01/2022 on Eliquis, recent strokes (10/2021 and 12/2021) who presented on 03/01/2022 with lethargy and altered mental status.  Personally reviewed hospitalization pertinent progress note and imaging.  Evaluated by Dr. Erlinda Hong for right PCA subacute infarct with petechial hemorrhagic conversion likely secondary to large vessel disease source.  CTA head/neck showed left V4 occlusion, right PCA occlusion vs near occlusion or focal intracranial stenosis.  Repeat CT head stable right PCA infarct without obvious HT.  EF 60 to 65% (01/2022).  LDL 48.  A1c 5.0.  Recommended continuation of Eliquis for PE and added aspirin 81 mg daily as well as continuation of Crestor.  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  Per therapy recommendations, discharged to SNF on 11/15.        PERTINENT IMAGING  Per hospitalization 03/01/2022 -03/06/2022 CT showed right PCA subacute infarct MRI confirmed right PCA subacute infarct with petechial hemorrhagic transformation CTA head and neck left V4 occlusion, right PCA occlusion versus near occlusion, multifocal intracranial stenosis, largely unchanged from previous imaging CT repeat stable right PCA infarct without obvious HT 2D Echo EF 60 to  65% in 01/2022 LDL 48 HgbA1c 5.0  Per hospitalization 01/11/2022 - *** CT head No acute abnormality. Stable 5 cm meningioma. Small vessel disease. Atrophy.  MRI  punctate focus of restricted diffusion at anterior aspect of previous infarct CT angio shows multifocal areas of narrowing of the left vertebral artery which is likely occluded and distal V4.    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 MRI  large, acute right paramedian pontine infarct 2D Echo EF 60-65%, moderate hypertrophy of left basal-septal segment, interatrial septum not well visualized LDL 133 HgbA1c 7.0      ROS:   14 system review of systems performed and negative with exception of ***  PMH:  Past Medical History:  Diagnosis Date   Arthritis    CKD (chronic kidney disease), stage III ([place])    patient denies   Depression    Diabetes mellitus without complication ([place])    Difficult intravenous access    Endometrial cancer ([place])    GERD (gastroesophageal reflux disease)    Headache    History of radiation therapy 11/04/2019-12/01/2019   Endometrial HDR; Dr. Gery Pray   Hypertension    Hypothyroidism    Neuromuscular disorder ([place])    neuropathy in feet   Osteoarthritis    PMB (postmenopausal bleeding)    PONV (postoperative nausea and vomiting)    severe nausea and vomiting after knee  replacement 06-2014, did ok with 2018 knee replacement   Urinary frequency    Wears glasses     PSH:  Past Surgical History:  Procedure Laterality Date   BREAST SURGERY     cyst removed   CESAREAN SECTION     DILATION AND CURETTAGE OF UTERUS N/A 06/24/2019   Procedure: DILATATION AND CURETTAGE;  Surgeon: Lafonda Mosses, MD;   Location: [facility];  Service: Gynecology;  Laterality: N/A;   FRACTURE SURGERY     right knee   INTRAUTERINE DEVICE (IUD) INSERTION N/A 06/24/2019   Procedure: INTRAUTERINE DEVICE (IUD) INSERTION MIRENA;  Surgeon: Lafonda Mosses, MD;  Location: [facility];  Service: Gynecology;  Laterality: N/A;   IR ANGIO INTRA EXTRACRAN SEL COM CAROTID INNOMINATE UNI R MOD SED  11/05/2021   IR ANGIO VERTEBRAL SEL VERTEBRAL UNI R MOD SED  11/05/2021   IR ANGIOGRAM PULMONARY BILATERAL SELECTIVE  01/28/2022   IR ANGIOGRAM SELECTIVE EACH ADDITIONAL VESSEL  01/28/2022   IR ANGIOGRAM SELECTIVE EACH ADDITIONAL VESSEL  01/28/2022   IR CT HEAD LTD  11/01/2021   IR INTRA CRAN STENT  11/01/2021   IR RADIOLOGIST EVAL & MGMT  12/19/2021   IR THROMBECT PRIM MECH INIT (INCLU) MOD SED  01/28/2022   IR THROMBECT PRIM MECH INIT (INCLU) MOD SED  01/28/2022   IR US GUIDE VASC ACCESS RIGHT  11/01/2021   IR US GUIDE VASC ACCESS RIGHT  01/28/2022   IRRIGATION AND DEBRIDEMENT SEBACEOUS CYST     JOINT REPLACEMENT Right    right   LYMPH NODE DISSECTION N/A 09/14/2019   Procedure: LYMPH NODE DISSECTION;  Surgeon: Lafonda Mosses, MD;  Location: [facility];  Service: Gynecology;  Laterality: N/A;   RADIOLOGY WITH ANESTHESIA N/A 11/01/2021   Procedure: ANGIOPLASTY/STENT;  Surgeon: Luanne Bras, MD;  Location: [facility];  Service: Radiology;  Laterality: N/A;   RADIOLOGY WITH ANESTHESIA N/A 01/28/2022   Procedure: IR WITH ANESTHESIA;  Surgeon: Radiologist, Medication, MD;  Location: [facility];  Service: Radiology;  Laterality: N/A;   ROBOTIC ASSISTED TOTAL HYSTERECTOMY WITH BILATERAL SALPINGO OOPHERECTOMY Bilateral 09/14/2019   Procedure: XI ROBOTIC ASSISTED TOTAL HYSTERECTOMY WITH BILATERAL SALPINGO OOPHORECTOMY;  Surgeon: Lafonda Mosses, MD;  Location: [facility];  Service: Gynecology;  Laterality: Bilateral;   SENTINEL NODE BIOPSY N/A 09/14/2019   Procedure: SENTINEL NODE BIOPSY;  Surgeon: Lafonda Mosses, MD;  Location: [facility];  Service: Gynecology;  Laterality: N/A;   teeth extration     TONSILLECTOMY     TOTAL HIP ARTHROPLASTY Right 02/23/2015   Procedure: RIGHT TOTAL HIP ARTHROPLASTY ANTERIOR APPROACH;  Surgeon: Leandrew Koyanagi, MD;  Location: [facility];  Service: Orthopedics;  Laterality: Right;   TOTAL KNEE ARTHROPLASTY Left 06/27/2016   Procedure: LEFT TOTAL KNEE ARTHROPLASTY;  Surgeon: Leandrew Koyanagi, MD;  Location: [facility];  Service: Orthopedics;  Laterality: Left;    Social History:  Social History   Socioeconomic History   Marital status: Widowed    Spouse name: Not on file   Number of children: Not on file   Years of education: Not on file   Highest education level: Not on file  Occupational History   Not on file  Tobacco Use   Smoking status: Former    Packs/day: 1.50    Years: 45.00    Total pack years: 67.50    Types: Cigarettes    Quit date: 04/22/2002    Years since quitting: 19.9   Smokeless tobacco: Never  Vaping Use   Vaping Use: Never used  Substance and Sexual Activity   Alcohol use: Yes    Comment: rarely   Drug use: No   Sexual activity: Not Currently  Other Topics Concern   Not on file  Social History Narrative   Not on file   Social Determinants of Health   Financial Resource Strain: Not on file  Food Insecurity: No Food Insecurity (03/02/2022)   Hunger Vital Sign    Worried About Running Out of Food in the Last Year: Never true    Ran Out of Food in the Last Year: Never true  Transportation Needs: No Transportation Needs (03/01/2022)   PRAPARE - Hydrologist (Medical): No    Lack of Transportation (Non-Medical): No  Physical Activity: Not on file  Stress: Not on file  Social Connections: Not on file  Intimate Partner Violence: Not At Risk (03/02/2022)   Humiliation, Afraid, Rape, and Kick questionnaire    Dodson of Current or Ex-Partner: No    Emotionally Abused: No    Physically Abused: No    Sexually Abused: No     Family History:  Family History  Problem Relation Age of Onset   Pancreatic cancer Mother    Stroke Father    Hypertension Father    Colon cancer Neg Hx    Breast cancer Neg Hx    Ovarian cancer Neg Hx    Endometrial cancer Neg Hx    Prostate cancer Neg Hx     Medications:   Current Outpatient Medications on File Prior to Visit  Medication Sig Dispense Refill   antiseptic oral rinse (BIOTENE) LIQD 15 mLs by Mouth Rinse route daily as needed for dry mouth.     apixaban (ELIQUIS) 5 MG TABS tablet Take 1 tablet (5 mg total) by mouth 2 (two) times daily. 60 tablet 3   aspirin EC 81 MG tablet Take 1 tablet (81 mg total) by mouth daily. Swallow whole. 30 tablet 12   bisacodyl (DULCOLAX) 10 MG suppository Place 10 mg rectally daily as needed (constipation not relieved by Milk of Magnesia).     docusate sodium (COLACE) 100 MG capsule Take 1 capsule (100 mg total) by mouth 2 (two) times daily. 60 capsule 0   fenofibrate 160 MG tablet Take 1 tablet (160 mg total) by mouth daily after breakfast. (Patient taking differently: Take 160 mg by mouth daily.) 30 tablet 0   folic acid (FOLVITE) 1 MG tablet Take 1 tablet (1 mg total) by mouth daily. 30 tablet 0   insulin glargine (LANTUS) 100 UNIT/ML Solostar Pen Inject 15 Units into the skin daily. (Patient taking differently: Inject 15 Units into the skin at bedtime.) 15 mL 0   insulin lispro (HUMALOG) 100 UNIT/ML KwikPen Inject 3 Units into the skin 3 (three) times daily with meals. (Patient taking differently: Inject 3 Units into the skin 3 (three) times daily.) 15 mL 11   Insulin Pen Needle 32G X 4 MM MISC Use daily in the morning, at noon, and at bedtime. 100 each 1   Magnesium Hydroxide (MILK OF MAGNESIA PO) Take 30 mLs by mouth daily as needed (constipation).     methylphenidate (RITALIN) 5 MG tablet Take 1 tablet (5 mg total) by mouth 2 (two) times daily with breakfast and lunch. (Patient taking differently: Take 5 mg by mouth 2 (two) times  daily.) 60 tablet 0   Nutritional Supplements (NUTRITIONAL DRINK MIX PO) Take 1 Dose by mouth 3 (  three) times daily. Magic Cup     pantoprazole (PROTONIX) 40 MG tablet Take 1 tablet (40 mg total) by mouth daily. (Patient taking differently: Take 40 mg by mouth in the morning.) 30 tablet 0   rosuvastatin (CRESTOR) 20 MG tablet Take 1 tablet (20 mg total) by mouth daily. (Patient taking differently: Take 20 mg by mouth at bedtime.) 30 tablet 0   senna (SENOKOT) 8.6 MG TABS tablet Take 1.5 tablets (12.9 mg total) by mouth at bedtime as needed. (Patient taking differently: Take 12.9 mg by mouth at bedtime as needed (constipation).) 120 tablet 0   Sodium Phosphates (RA SALINE ENEMA RE) Place 1 enema rectally daily as needed (constipation not relieved by bisacodyl suppository).     Zinc Oxide (DESITIN) 13 % CREA Apply 1 application  topically 2 (two) times daily. To buttocks for protection. (Patient taking differently: Apply 1 application  topically 2 (two) times daily. To buttocks) 113 g 3   No current facility-administered medications on file prior to visit.    Allergies:   Allergies  Allergen Reactions   Anesthetics, Amide Nausea And Vomiting    Pt is intolerant to general anesthesia. Pt will throw up and has thrown up during procedure.    Ether Nausea And Vomiting      OBJECTIVE:  Physical Exam  There were no vitals filed for this visit. There is no height or weight on file to calculate BMI. No results found.      No data to display           General: well developed, well nourished, seated, in no evident distress Head: head normocephalic and atraumatic.   Neck: supple with no carotid or supraclavicular bruits Cardiovascular: regular rate and rhythm, no murmurs Musculoskeletal: no deformity Skin:  no rash/petichiae Vascular:  Normal pulses all extremities   Neurologic Exam Mental Status: Awake and fully alert. Oriented to place and time. Recent and remote memory intact.  Attention span, concentration and fund of knowledge appropriate. Mood and affect appropriate.  Cranial Nerves: Fundoscopic exam reveals sharp disc margins. Pupils equal, briskly reactive to light. Extraocular movements full without nystagmus. Visual fields full to confrontation. Hearing intact. Facial sensation intact. Face, tongue, palate moves normally and symmetrically.  Motor: Normal bulk and tone. Normal strength in all tested extremity muscles Sensory.: intact to touch , pinprick , position and vibratory sensation.  Coordination: Rapid alternating movements normal in all extremities. Finger-to-nose and heel-to-shin performed accurately bilaterally. Gait and Station: Arises from chair without difficulty. Stance is normal. Gait demonstrates normal stride length and balance with ***. Tandem walk and heel toe ***.  Reflexes: 1+ and symmetric. Toes downgoing.     NIHSS  *** Modified Rankin  ***      ASSESSMENT: Cassandra Dodson is a 81 y.o. year old female with right PCA subacute infarct with petechial hemorrhage conversion likely secondary to large vessel disease source on 03/01/2022. Also right pontine infarct on 10/25/2021 s/p basilar artery stent assisted angioplasty and right pontine infarct 01/11/2022 likely secondary to small vessel disease.  Vascular risk factors include HTN, HLD, DM, intracranial stenosis, meningioma, advanced age, former tobacco use, obesity and PE 01/2022 on Eliquis.      PLAN:  Recurrent strokes:  Residual deficit: ***.  Continue aspirin '81mg'$  daily and rosuvastatin (Crestor) for secondary stroke prevention.  Remains on Eliquis for PE managed by PCP Discussed secondary stroke prevention measures and importance of close PCP follow up for aggressive stroke risk factor management including BP goal<130/90,  HLD with LDL goal<70 and DM with A1c.<7 .  Stroke labs 02/2022: LDL 48, A1c 5.0 I have gone over the pathophysiology of stroke, warning signs and symptoms, risk  factors and their management in some detail with instructions to go to the closest emergency room for symptoms of concern.     Follow up in *** or call earlier if needed   CC:  [facility] provider: Dr. Leonie Man PCP: Shon Baton, MD    I spent *** minutes of face-to-face and non-face-to-face time with patient.  This included previsit chart review including review of recent hospitalization, lab review, study review, order entry, electronic health record documentation, patient education regarding recent stroke including etiology, secondary stroke prevention measures and importance of managing stroke risk factors, residual deficits and typical recovery time and answered all other questions to patient satisfaction   Frann Rider, AGNP-BC  [facility] [address] [place], [place] [address]  Phone [phone] Fax [phone] Note: This document was prepared with digital dictation and possible smart phrase technology. Any transcriptional errors that result from this process are unintentional.

## 2022-04-10 ENCOUNTER — Telehealth: Payer: Self-pay | Admitting: Adult Health

## 2022-04-10 ENCOUNTER — Ambulatory Visit (INDEPENDENT_AMBULATORY_CARE_PROVIDER_SITE_OTHER): Payer: Medicare Other | Admitting: Adult Health

## 2022-04-10 ENCOUNTER — Encounter: Payer: Self-pay | Admitting: Adult Health

## 2022-04-10 VITALS — BP 131/71 | HR 98 | Ht 63.0 in | Wt 169.2 lb

## 2022-04-10 DIAGNOSIS — Z09 Encounter for follow-up examination after completed treatment for conditions other than malignant neoplasm: Secondary | ICD-10-CM

## 2022-04-10 DIAGNOSIS — I639 Cerebral infarction, unspecified: Secondary | ICD-10-CM | POA: Diagnosis not present

## 2022-04-10 DIAGNOSIS — D320 Benign neoplasm of cerebral meninges: Secondary | ICD-10-CM | POA: Diagnosis not present

## 2022-04-10 NOTE — Progress Notes (Signed)
I agree with the above plan 

## 2022-04-10 NOTE — Patient Instructions (Signed)
Continue working with therapies for hopeful further improvement   Referral placed to neurosurgery for follow up of meningioma - will provide them son's contact information to schedule  Continue aspirin 81 mg daily  and Crestor for secondary stroke prevention  Continue Eliquis for treatment of pulmonary embolism as advised  Continue to follow up with PCP regarding cholesterol and blood pressure management  Maintain strict control of hypertension with blood pressure goal below 130/90, diabetes with hemoglobin A1c goal below 7.0 % and cholesterol with LDL cholesterol (bad cholesterol) goal below 70 mg/dL.   Signs of a Stroke? Follow the BEFAST method:  Balance Watch for a sudden loss of balance, trouble with coordination or vertigo Eyes Is there a sudden loss of vision in one or both eyes? Or double vision?  Face: Ask the person to smile. Does one side of the face droop or is it numb?  Arms: Ask the person to raise both arms. Does one arm drift downward? Is there weakness or numbness of a leg? Speech: Ask the person to repeat a simple phrase. Does the speech sound slurred/strange? Is the person confused ? Time: If you observe any of these signs, call 911.     Followup in the future with me in 3 months or call earlier if needed      Thank you for coming to see [place] at [facility]. I hope we have been able to provide you high quality care today.  You may receive a patient satisfaction survey over the next few weeks. We would appreciate your feedback and comments so that we may continue to improve ourselves and the health of our patients.

## 2022-05-01 ENCOUNTER — Emergency Department (HOSPITAL_COMMUNITY): Payer: Medicare Other

## 2022-05-01 ENCOUNTER — Inpatient Hospital Stay (HOSPITAL_COMMUNITY)
Admission: EM | Admit: 2022-05-01 | Discharge: 2022-05-14 | DRG: 871 | Disposition: A | Discharge status: Hospice | Payer: Medicare Other | Source: Skilled Nursing Facility | Attending: Internal Medicine | Admitting: Internal Medicine

## 2022-05-01 ENCOUNTER — Other Ambulatory Visit: Payer: Self-pay

## 2022-05-01 DIAGNOSIS — Z96652 Presence of left artificial knee joint: Secondary | ICD-10-CM | POA: Diagnosis present

## 2022-05-01 DIAGNOSIS — G9341 Metabolic encephalopathy: Secondary | ICD-10-CM | POA: Diagnosis present

## 2022-05-01 DIAGNOSIS — A4189 Other specified sepsis: Secondary | ICD-10-CM | POA: Diagnosis not present

## 2022-05-01 DIAGNOSIS — J449 Chronic obstructive pulmonary disease, unspecified: Secondary | ICD-10-CM | POA: Diagnosis present

## 2022-05-01 DIAGNOSIS — Z66 Do not resuscitate: Secondary | ICD-10-CM | POA: Diagnosis not present

## 2022-05-01 DIAGNOSIS — Z96641 Presence of right artificial hip joint: Secondary | ICD-10-CM | POA: Diagnosis present

## 2022-05-01 DIAGNOSIS — E039 Hypothyroidism, unspecified: Secondary | ICD-10-CM | POA: Diagnosis present

## 2022-05-01 DIAGNOSIS — E785 Hyperlipidemia, unspecified: Secondary | ICD-10-CM | POA: Diagnosis present

## 2022-05-01 DIAGNOSIS — M86172 Other acute osteomyelitis, left ankle and foot: Secondary | ICD-10-CM | POA: Diagnosis present

## 2022-05-01 DIAGNOSIS — G936 Cerebral edema: Secondary | ICD-10-CM | POA: Diagnosis present

## 2022-05-01 DIAGNOSIS — Z90710 Acquired absence of both cervix and uterus: Secondary | ICD-10-CM

## 2022-05-01 DIAGNOSIS — Z79899 Other long term (current) drug therapy: Secondary | ICD-10-CM

## 2022-05-01 DIAGNOSIS — T380X5A Adverse effect of glucocorticoids and synthetic analogues, initial encounter: Secondary | ICD-10-CM | POA: Diagnosis present

## 2022-05-01 DIAGNOSIS — N1832 Chronic kidney disease, stage 3b: Secondary | ICD-10-CM | POA: Diagnosis present

## 2022-05-01 DIAGNOSIS — D320 Benign neoplasm of cerebral meninges: Secondary | ICD-10-CM | POA: Diagnosis present

## 2022-05-01 DIAGNOSIS — I69991 Dysphagia following unspecified cerebrovascular disease: Secondary | ICD-10-CM

## 2022-05-01 DIAGNOSIS — U071 COVID-19: Secondary | ICD-10-CM | POA: Diagnosis present

## 2022-05-01 DIAGNOSIS — Z86711 Personal history of pulmonary embolism: Secondary | ICD-10-CM

## 2022-05-01 DIAGNOSIS — I5022 Chronic systolic (congestive) heart failure: Secondary | ICD-10-CM | POA: Diagnosis present

## 2022-05-01 DIAGNOSIS — L89154 Pressure ulcer of sacral region, stage 4: Secondary | ICD-10-CM | POA: Diagnosis present

## 2022-05-01 DIAGNOSIS — I130 Hypertensive heart and chronic kidney disease with heart failure and stage 1 through stage 4 chronic kidney disease, or unspecified chronic kidney disease: Secondary | ICD-10-CM | POA: Diagnosis present

## 2022-05-01 DIAGNOSIS — E1169 Type 2 diabetes mellitus with other specified complication: Secondary | ICD-10-CM | POA: Diagnosis present

## 2022-05-01 DIAGNOSIS — I480 Paroxysmal atrial fibrillation: Secondary | ICD-10-CM | POA: Diagnosis not present

## 2022-05-01 DIAGNOSIS — Z884 Allergy status to anesthetic agent status: Secondary | ICD-10-CM

## 2022-05-01 DIAGNOSIS — Z87891 Personal history of nicotine dependence: Secondary | ICD-10-CM

## 2022-05-01 DIAGNOSIS — E1165 Type 2 diabetes mellitus with hyperglycemia: Secondary | ICD-10-CM | POA: Diagnosis present

## 2022-05-01 DIAGNOSIS — Z794 Long term (current) use of insulin: Secondary | ICD-10-CM

## 2022-05-01 DIAGNOSIS — N390 Urinary tract infection, site not specified: Secondary | ICD-10-CM | POA: Diagnosis present

## 2022-05-01 DIAGNOSIS — Z6829 Body mass index (BMI) 29.0-29.9, adult: Secondary | ICD-10-CM

## 2022-05-01 DIAGNOSIS — E11621 Type 2 diabetes mellitus with foot ulcer: Secondary | ICD-10-CM

## 2022-05-01 DIAGNOSIS — E440 Moderate protein-calorie malnutrition: Secondary | ICD-10-CM | POA: Diagnosis present

## 2022-05-01 DIAGNOSIS — R54 Age-related physical debility: Secondary | ICD-10-CM | POA: Diagnosis present

## 2022-05-01 DIAGNOSIS — K219 Gastro-esophageal reflux disease without esophagitis: Secondary | ICD-10-CM | POA: Diagnosis present

## 2022-05-01 DIAGNOSIS — Z923 Personal history of irradiation: Secondary | ICD-10-CM

## 2022-05-01 DIAGNOSIS — L89626 Pressure-induced deep tissue damage of left heel: Secondary | ICD-10-CM | POA: Diagnosis present

## 2022-05-01 DIAGNOSIS — E871 Hypo-osmolality and hyponatremia: Secondary | ICD-10-CM | POA: Diagnosis present

## 2022-05-01 DIAGNOSIS — Z7901 Long term (current) use of anticoagulants: Secondary | ICD-10-CM

## 2022-05-01 DIAGNOSIS — K5909 Other constipation: Secondary | ICD-10-CM | POA: Diagnosis present

## 2022-05-01 DIAGNOSIS — I2510 Atherosclerotic heart disease of native coronary artery without angina pectoris: Secondary | ICD-10-CM | POA: Diagnosis present

## 2022-05-01 DIAGNOSIS — J9621 Acute and chronic respiratory failure with hypoxia: Secondary | ICD-10-CM | POA: Diagnosis present

## 2022-05-01 DIAGNOSIS — Z515 Encounter for palliative care: Secondary | ICD-10-CM

## 2022-05-01 DIAGNOSIS — Z7982 Long term (current) use of aspirin: Secondary | ICD-10-CM

## 2022-05-01 DIAGNOSIS — R4182 Altered mental status, unspecified: Principal | ICD-10-CM

## 2022-05-01 DIAGNOSIS — E1151 Type 2 diabetes mellitus with diabetic peripheral angiopathy without gangrene: Secondary | ICD-10-CM | POA: Diagnosis present

## 2022-05-01 DIAGNOSIS — E1122 Type 2 diabetes mellitus with diabetic chronic kidney disease: Secondary | ICD-10-CM | POA: Diagnosis present

## 2022-05-01 DIAGNOSIS — M869 Osteomyelitis, unspecified: Secondary | ICD-10-CM

## 2022-05-01 DIAGNOSIS — S31000A Unspecified open wound of lower back and pelvis without penetration into retroperitoneum, initial encounter: Secondary | ICD-10-CM

## 2022-05-01 DIAGNOSIS — Z800 Family history of malignant neoplasm of digestive organs: Secondary | ICD-10-CM

## 2022-05-01 DIAGNOSIS — Z8542 Personal history of malignant neoplasm of other parts of uterus: Secondary | ICD-10-CM

## 2022-05-01 DIAGNOSIS — Z8249 Family history of ischemic heart disease and other diseases of the circulatory system: Secondary | ICD-10-CM

## 2022-05-01 DIAGNOSIS — Z823 Family history of stroke: Secondary | ICD-10-CM

## 2022-05-01 DIAGNOSIS — I69954 Hemiplegia and hemiparesis following unspecified cerebrovascular disease affecting left non-dominant side: Secondary | ICD-10-CM

## 2022-05-01 DIAGNOSIS — E876 Hypokalemia: Secondary | ICD-10-CM | POA: Diagnosis not present

## 2022-05-01 LAB — CBC WITH DIFFERENTIAL/PLATELET
Abs Immature Granulocytes: 0.05 10*3/uL (ref 0.00–0.07)
Basophils Absolute: 0.1 10*3/uL (ref 0.0–0.1)
Basophils Relative: 0 %
Eosinophils Absolute: 0.1 10*3/uL (ref 0.0–0.5)
Eosinophils Relative: 1 %
HCT: 33.5 % — ABNORMAL LOW (ref 36.0–46.0)
Hemoglobin: 10.3 g/dL — ABNORMAL LOW (ref 12.0–15.0)
Immature Granulocytes: 0 %
Lymphocytes Relative: 10 %
Lymphs Abs: 1.1 10*3/uL (ref 0.7–4.0)
MCH: 26.9 pg (ref 26.0–34.0)
MCHC: 30.7 g/dL (ref 30.0–36.0)
MCV: 87.5 fL (ref 80.0–100.0)
Monocytes Absolute: 0.9 10*3/uL (ref 0.1–1.0)
Monocytes Relative: 8 %
Neutro Abs: 9 10*3/uL — ABNORMAL HIGH (ref 1.7–7.7)
Neutrophils Relative %: 81 %
Platelets: 325 10*3/uL (ref 150–400)
RBC: 3.83 MIL/uL — ABNORMAL LOW (ref 3.87–5.11)
RDW: 14.9 % (ref 11.5–15.5)
WBC: 11.2 10*3/uL — ABNORMAL HIGH (ref 4.0–10.5)
nRBC: 0 % (ref 0.0–0.2)

## 2022-05-01 LAB — COMPREHENSIVE METABOLIC PANEL
ALT: 48 U/L — ABNORMAL HIGH (ref 0–44)
AST: 50 U/L — ABNORMAL HIGH (ref 15–41)
Albumin: 2 g/dL — ABNORMAL LOW (ref 3.5–5.0)
Alkaline Phosphatase: 78 U/L (ref 38–126)
Anion gap: 11 (ref 5–15)
BUN: 12 mg/dL (ref 8–23)
CO2: 25 mmol/L (ref 22–32)
Calcium: 9.3 mg/dL (ref 8.9–10.3)
Chloride: 93 mmol/L — ABNORMAL LOW (ref 98–111)
Creatinine, Ser: 0.77 mg/dL (ref 0.44–1.00)
GFR, Estimated: >60 mL/min (ref >60)
Glucose, Bld: 237 mg/dL — ABNORMAL HIGH (ref 70–99)
Potassium: 3.9 mmol/L (ref 3.5–5.1)
Sodium: 129 mmol/L — ABNORMAL LOW (ref 135–145)
Total Bilirubin: 1 mg/dL (ref 0.3–1.2)
Total Protein: 5.9 g/dL — ABNORMAL LOW (ref 6.5–8.1)

## 2022-05-01 LAB — BRAIN NATRIURETIC PEPTIDE: B Natriuretic Peptide: 107.2 pg/mL — ABNORMAL HIGH (ref 0.0–100.0)

## 2022-05-01 LAB — RESP PANEL BY RT-PCR (RSV, FLU A&B, COVID)  RVPGX2
Influenza A by PCR: NEGATIVE
Influenza B by PCR: NEGATIVE
Resp Syncytial Virus by PCR: NEGATIVE
SARS Coronavirus 2 by RT PCR: POSITIVE — AB

## 2022-05-01 LAB — TROPONIN I (HIGH SENSITIVITY): Troponin I (High Sensitivity): 10 ng/L (ref <18)

## 2022-05-01 MED ORDER — SODIUM CHLORIDE 0.9 % IV SOLN
2.0000 g | Freq: Once | INTRAVENOUS | Status: AC
  Administered 2022-05-02: 2 g via INTRAVENOUS
  Filled 2022-05-01: qty 12.5

## 2022-05-01 MED ORDER — METRONIDAZOLE 500 MG/100ML IV SOLN
500.0000 mg | Freq: Once | INTRAVENOUS | Status: AC
  Administered 2022-05-02: 500 mg via INTRAVENOUS
  Filled 2022-05-01: qty 100

## 2022-05-01 MED ORDER — DEXAMETHASONE SODIUM PHOSPHATE 10 MG/ML IJ SOLN
10.0000 mg | Freq: Once | INTRAMUSCULAR | Status: AC
  Administered 2022-05-02: 10 mg via INTRAVENOUS
  Filled 2022-05-01: qty 1

## 2022-05-01 MED ORDER — VANCOMYCIN HCL 1250 MG/250ML IV SOLN
1250.0000 mg | Freq: Once | INTRAVENOUS | Status: AC
  Administered 2022-05-02: 1250 mg via INTRAVENOUS
  Filled 2022-05-01: qty 250

## 2022-05-01 NOTE — ED Triage Notes (Signed)
Pt BIB EMS. Per EMS, pt c/o increasing SOB today. Upon EMS arrival, pt O2 sats at 89% on 4L [place] (pt normally on 4L [place]). EMS states pt has multiple sores on feet and bottom. Pt alert at this time but not responding to questions.

## 2022-05-01 NOTE — ED Notes (Signed)
Pt brief changed at this time. New mepilex dressing placed on sacral area. New non stick dressing and kerlex placed on pt's left heel.

## 2022-05-01 NOTE — ED Provider Notes (Incomplete)
[facility] EMERGENCY DEPARTMENT Provider Note   CSN: [id number] Arrival date & time: 05/01/22  1927     History {Add pertinent medical, surgical, social history, OB history to HPI:1} Chief Complaint  Patient presents with   Shortness of Breath    Cassandra Dodson is a 82 y.o. female.  HPI     82 year old female with a history of PE on Eliquis, CVA admission in November 2023, type 2 diabetes, hypertension, meningioma, endometrial cancer, hypothyroidism, CKD stage III, who presents with concern for increasing shortness of breath with acute on chronic hypoxia, oxygen saturations down to 89% on her home 4L of O2.  [facility]  Her name, birthday, son's name, address, can name people, awake, alert, conversations, wheelchair bound now, was bedridden, But if rehab her she was one day on, one day off, would want to sleep all day.    Increased fatigue today, appeared to be short of breath at facility     Past Medical History:  Diagnosis Date   Arthritis    CKD (chronic kidney disease), stage III ([place])    patient denies   Depression    Diabetes mellitus without complication ([place])    Difficult intravenous access    Endometrial cancer ([place])    GERD (gastroesophageal reflux disease)    Headache    History of radiation therapy 11/04/2019-12/01/2019   Endometrial HDR; Dr. Gery Pray   Hypertension    Hypothyroidism    Neuromuscular disorder ([place])    neuropathy in feet   Osteoarthritis    PMB (postmenopausal bleeding)    PONV (postoperative nausea and vomiting)    severe nausea and vomiting after knee replacement 06-2014, did ok with 2018 knee replacement   Urinary frequency    Wears glasses      Home Medications Prior to Admission medications   Medication Sig Start Date End Date Taking? Authorizing Provider  antiseptic oral rinse (BIOTENE) LIQD 15 mLs by Mouth Rinse route daily as needed for dry mouth.    [provider]  apixaban (ELIQUIS) 5  MG TABS tablet Take 1 tablet (5 mg total) by mouth 2 (two) times daily. 02/16/22   Lorella Nimrod, MD  aspirin EC 81 MG tablet Take 1 tablet (81 mg total) by mouth daily. Swallow whole. 03/07/22   Little Ishikawa, MD  bisacodyl (DULCOLAX) 10 MG suppository Place 10 mg rectally daily as needed (constipation not relieved by Milk of Magnesia).    [provider]  docusate sodium (COLACE) 100 MG capsule Take 1 capsule (100 mg total) by mouth 2 (two) times daily. 02/16/22   Lorella Nimrod, MD  fenofibrate 160 MG tablet Take 1 tablet (160 mg total) by mouth daily after breakfast. Patient taking differently: Take 160 mg by mouth daily. 11/27/21   Love, Ivan Anchors, PA-C  folic acid (FOLVITE) 1 MG tablet Take 1 tablet (1 mg total) by mouth daily. 11/27/21   Love, Ivan Anchors, PA-C  insulin glargine (LANTUS) 100 UNIT/ML Solostar Pen Inject 15 Units into the skin daily. Patient taking differently: Inject 15 Units into the skin at bedtime. 02/16/22   Lorella Nimrod, MD  insulin lispro (HUMALOG) 100 UNIT/ML KwikPen Inject 3 Units into the skin 3 (three) times daily with meals. Patient taking differently: Inject 3 Units into the skin 3 (three) times daily. 02/16/22   Lorella Nimrod, MD  Insulin Pen Needle 32G X 4 MM MISC Use daily in the morning, at noon, and at bedtime. 11/27/21   Reesa Chew  S, PA-C  Magnesium Hydroxide (MILK OF MAGNESIA PO) Take 30 mLs by mouth daily as needed (constipation).    [provider]  methylphenidate (RITALIN) 5 MG tablet Take 1 tablet (5 mg total) by mouth 2 (two) times daily with breakfast and lunch. Patient taking differently: Take 5 mg by mouth 2 (two) times daily. 11/27/21   Love, Ivan Anchors, PA-C  Nutritional Supplements (NUTRITIONAL DRINK MIX PO) Take 1 Dose by mouth 3 (three) times daily. Magic Cup    [provider]  pantoprazole (PROTONIX) 40 MG tablet Take 1 tablet (40 mg total) by mouth daily. Patient taking differently: Take 40 mg by mouth in the morning.  11/27/21   Love, Ivan Anchors, PA-C  rosuvastatin (CRESTOR) 20 MG tablet Take 1 tablet (20 mg total) by mouth daily. Patient taking differently: Take 20 mg by mouth at bedtime. 11/27/21   Love, Ivan Anchors, PA-C  senna (SENOKOT) 8.6 MG TABS tablet Take 1.5 tablets (12.9 mg total) by mouth at bedtime as needed. Patient taking differently: Take 12.9 mg by mouth at bedtime as needed (constipation). 02/16/22   Lorella Nimrod, MD  Sodium Phosphates (RA SALINE ENEMA RE) Place 1 enema rectally daily as needed (constipation not relieved by bisacodyl suppository).    [provider]  Zinc Oxide (DESITIN) 13 % CREA Apply 1 application  topically 2 (two) times daily. To buttocks for protection. Patient taking differently: Apply 1 application  topically 2 (two) times daily. To buttocks 11/27/21   Love, Ivan Anchors, PA-C      Allergies    Anesthetics, amide and Ether    Review of Systems   Review of Systems  Physical Exam Updated Vital Signs BP 139/79   Pulse (!) 38   Temp 98.6 F (37 C) (Oral)   Resp 17   SpO2 100%  Physical Exam  ED Results / Procedures / Treatments   Labs (all labs ordered are listed, but only abnormal results are displayed) Labs Reviewed - No data to display  EKG None  Radiology No results found.  Procedures Procedures  {Document cardiac monitor, telemetry assessment procedure when appropriate:1}  Medications Ordered in ED Medications - No data to display  ED Course/ Medical Decision Making/ A&P                           Medical Decision Making Amount and/or Complexity of Data Reviewed Labs: ordered. Radiology: ordered.  Risk Prescription drug management.   ***  {Document critical care time when appropriate:1} {Document review of labs and clinical decision tools ie heart score, Chads2Vasc2 etc:1}  {Document your independent review of radiology images, and any outside records:1} {Document your discussion with family members, caretakers, and with  consultants:1} {Document social determinants of health affecting pt's care:1} {Document your decision making why or why not admission, treatments were needed:1} Final Clinical Impression(s) / ED Diagnoses Final diagnoses:  None    Rx / DC Orders ED Discharge Orders     None

## 2022-05-02 ENCOUNTER — Emergency Department (HOSPITAL_COMMUNITY): Payer: Medicare Other

## 2022-05-02 DIAGNOSIS — U071 COVID-19: Secondary | ICD-10-CM | POA: Diagnosis present

## 2022-05-02 DIAGNOSIS — E1151 Type 2 diabetes mellitus with diabetic peripheral angiopathy without gangrene: Secondary | ICD-10-CM | POA: Diagnosis present

## 2022-05-02 DIAGNOSIS — G9341 Metabolic encephalopathy: Secondary | ICD-10-CM | POA: Diagnosis present

## 2022-05-02 DIAGNOSIS — S31000A Unspecified open wound of lower back and pelvis without penetration into retroperitoneum, initial encounter: Secondary | ICD-10-CM | POA: Diagnosis not present

## 2022-05-02 DIAGNOSIS — E039 Hypothyroidism, unspecified: Secondary | ICD-10-CM | POA: Diagnosis present

## 2022-05-02 DIAGNOSIS — I69954 Hemiplegia and hemiparesis following unspecified cerebrovascular disease affecting left non-dominant side: Secondary | ICD-10-CM | POA: Diagnosis not present

## 2022-05-02 DIAGNOSIS — E440 Moderate protein-calorie malnutrition: Secondary | ICD-10-CM | POA: Diagnosis present

## 2022-05-02 DIAGNOSIS — E1122 Type 2 diabetes mellitus with diabetic chronic kidney disease: Secondary | ICD-10-CM | POA: Diagnosis present

## 2022-05-02 DIAGNOSIS — G936 Cerebral edema: Secondary | ICD-10-CM | POA: Diagnosis present

## 2022-05-02 DIAGNOSIS — R4182 Altered mental status, unspecified: Secondary | ICD-10-CM | POA: Diagnosis not present

## 2022-05-02 DIAGNOSIS — N390 Urinary tract infection, site not specified: Secondary | ICD-10-CM | POA: Diagnosis present

## 2022-05-02 DIAGNOSIS — L0390 Cellulitis, unspecified: Secondary | ICD-10-CM | POA: Diagnosis not present

## 2022-05-02 DIAGNOSIS — I4891 Unspecified atrial fibrillation: Secondary | ICD-10-CM | POA: Diagnosis not present

## 2022-05-02 DIAGNOSIS — J449 Chronic obstructive pulmonary disease, unspecified: Secondary | ICD-10-CM | POA: Diagnosis present

## 2022-05-02 DIAGNOSIS — A4189 Other specified sepsis: Secondary | ICD-10-CM | POA: Diagnosis present

## 2022-05-02 DIAGNOSIS — L97409 Non-pressure chronic ulcer of unspecified heel and midfoot with unspecified severity: Secondary | ICD-10-CM | POA: Diagnosis not present

## 2022-05-02 DIAGNOSIS — D329 Benign neoplasm of meninges, unspecified: Secondary | ICD-10-CM | POA: Diagnosis not present

## 2022-05-02 DIAGNOSIS — I5022 Chronic systolic (congestive) heart failure: Secondary | ICD-10-CM | POA: Diagnosis present

## 2022-05-02 DIAGNOSIS — L89626 Pressure-induced deep tissue damage of left heel: Secondary | ICD-10-CM | POA: Diagnosis present

## 2022-05-02 DIAGNOSIS — Z794 Long term (current) use of insulin: Secondary | ICD-10-CM | POA: Diagnosis not present

## 2022-05-02 DIAGNOSIS — M869 Osteomyelitis, unspecified: Secondary | ICD-10-CM

## 2022-05-02 DIAGNOSIS — J9621 Acute and chronic respiratory failure with hypoxia: Secondary | ICD-10-CM | POA: Diagnosis present

## 2022-05-02 DIAGNOSIS — Z7189 Other specified counseling: Secondary | ICD-10-CM | POA: Diagnosis not present

## 2022-05-02 DIAGNOSIS — I70202 Unspecified atherosclerosis of native arteries of extremities, left leg: Secondary | ICD-10-CM | POA: Diagnosis not present

## 2022-05-02 DIAGNOSIS — E1165 Type 2 diabetes mellitus with hyperglycemia: Secondary | ICD-10-CM | POA: Diagnosis present

## 2022-05-02 DIAGNOSIS — M86172 Other acute osteomyelitis, left ankle and foot: Secondary | ICD-10-CM | POA: Diagnosis present

## 2022-05-02 DIAGNOSIS — Z66 Do not resuscitate: Secondary | ICD-10-CM | POA: Diagnosis not present

## 2022-05-02 DIAGNOSIS — I130 Hypertensive heart and chronic kidney disease with heart failure and stage 1 through stage 4 chronic kidney disease, or unspecified chronic kidney disease: Secondary | ICD-10-CM | POA: Diagnosis present

## 2022-05-02 DIAGNOSIS — N1832 Chronic kidney disease, stage 3b: Secondary | ICD-10-CM | POA: Diagnosis present

## 2022-05-02 DIAGNOSIS — Z515 Encounter for palliative care: Secondary | ICD-10-CM | POA: Diagnosis not present

## 2022-05-02 DIAGNOSIS — E871 Hypo-osmolality and hyponatremia: Secondary | ICD-10-CM | POA: Diagnosis present

## 2022-05-02 DIAGNOSIS — E1169 Type 2 diabetes mellitus with other specified complication: Secondary | ICD-10-CM | POA: Diagnosis present

## 2022-05-02 DIAGNOSIS — I480 Paroxysmal atrial fibrillation: Secondary | ICD-10-CM | POA: Diagnosis not present

## 2022-05-02 DIAGNOSIS — E11621 Type 2 diabetes mellitus with foot ulcer: Secondary | ICD-10-CM | POA: Diagnosis not present

## 2022-05-02 DIAGNOSIS — L89154 Pressure ulcer of sacral region, stage 4: Secondary | ICD-10-CM | POA: Diagnosis present

## 2022-05-02 LAB — COMPREHENSIVE METABOLIC PANEL
ALT: 41 U/L (ref 0–44)
AST: 33 U/L (ref 15–41)
Albumin: 1.8 g/dL — ABNORMAL LOW (ref 3.5–5.0)
Alkaline Phosphatase: 70 U/L (ref 38–126)
Anion gap: 7 (ref 5–15)
BUN: 10 mg/dL (ref 8–23)
CO2: 28 mmol/L (ref 22–32)
Calcium: 9.3 mg/dL (ref 8.9–10.3)
Chloride: 100 mmol/L (ref 98–111)
Creatinine, Ser: 0.66 mg/dL (ref 0.44–1.00)
GFR, Estimated: >60 mL/min (ref >60)
Glucose, Bld: 245 mg/dL — ABNORMAL HIGH (ref 70–99)
Potassium: 4 mmol/L (ref 3.5–5.1)
Sodium: 135 mmol/L (ref 135–145)
Total Bilirubin: 0.3 mg/dL (ref 0.3–1.2)
Total Protein: 5.6 g/dL — ABNORMAL LOW (ref 6.5–8.1)

## 2022-05-02 LAB — CBC
HCT: 34.1 % — ABNORMAL LOW (ref 36.0–46.0)
Hemoglobin: 10.5 g/dL — ABNORMAL LOW (ref 12.0–15.0)
MCH: 26.9 pg (ref 26.0–34.0)
MCHC: 30.8 g/dL (ref 30.0–36.0)
MCV: 87.2 fL (ref 80.0–100.0)
Platelets: 243 10*3/uL (ref 150–400)
RBC: 3.91 MIL/uL (ref 3.87–5.11)
RDW: 14.7 % (ref 11.5–15.5)
WBC: 6 10*3/uL (ref 4.0–10.5)
nRBC: 0 % (ref 0.0–0.2)

## 2022-05-02 LAB — URINALYSIS, ROUTINE W REFLEX MICROSCOPIC
Bilirubin Urine: NEGATIVE
Glucose, UA: NEGATIVE mg/dL
Ketones, ur: NEGATIVE mg/dL
Nitrite: NEGATIVE
Protein, ur: NEGATIVE mg/dL
RBC / HPF: >50 RBC/hpf — ABNORMAL HIGH (ref 0–5)
Specific Gravity, Urine: 1.015 (ref 1.005–1.030)
WBC, UA: >50 WBC/hpf — ABNORMAL HIGH (ref 0–5)
pH: 6 (ref 5.0–8.0)

## 2022-05-02 LAB — GLUCOSE, CAPILLARY
Glucose-Capillary: 128 mg/dL — ABNORMAL HIGH (ref 70–99)
Glucose-Capillary: 148 mg/dL — ABNORMAL HIGH (ref 70–99)
Glucose-Capillary: 161 mg/dL — ABNORMAL HIGH (ref 70–99)

## 2022-05-02 LAB — CBG MONITORING, ED
Glucose-Capillary: 231 mg/dL — ABNORMAL HIGH (ref 70–99)
Glucose-Capillary: 242 mg/dL — ABNORMAL HIGH (ref 70–99)
Glucose-Capillary: 197 mg/dL — ABNORMAL HIGH (ref 70–99)

## 2022-05-02 LAB — LACTIC ACID, PLASMA
Lactic Acid, Venous: 1.3 mmol/L (ref 0.5–1.9)
Lactic Acid, Venous: 3.2 mmol/L (ref 0.5–1.9)

## 2022-05-02 LAB — D-DIMER, QUANTITATIVE: D-Dimer, Quant: 1.26 ug/mL-FEU — ABNORMAL HIGH (ref 0.00–0.50)

## 2022-05-02 LAB — C-REACTIVE PROTEIN: CRP: 13.8 mg/dL — ABNORMAL HIGH (ref <1.0)

## 2022-05-02 LAB — LACTATE DEHYDROGENASE: LDH: 127 U/L (ref 98–192)

## 2022-05-02 LAB — TROPONIN I (HIGH SENSITIVITY): Troponin I (High Sensitivity): 10 ng/L (ref <18)

## 2022-05-02 LAB — MAGNESIUM: Magnesium: 1.6 mg/dL — ABNORMAL LOW (ref 1.7–2.4)

## 2022-05-02 MED ORDER — SODIUM CHLORIDE 0.9 % IV SOLN
100.0000 mg | Freq: Every day | INTRAVENOUS | Status: AC
  Administered 2022-05-03 – 2022-05-04 (×2): 100 mg via INTRAVENOUS
  Filled 2022-05-02 (×3): qty 20

## 2022-05-02 MED ORDER — FENOFIBRATE 160 MG PO TABS
160.0000 mg | ORAL_TABLET | Freq: Every day | ORAL | Status: DC
  Administered 2022-05-04 – 2022-05-14 (×11): 160 mg via ORAL
  Filled 2022-05-02 (×12): qty 1

## 2022-05-02 MED ORDER — SODIUM CHLORIDE 0.9 % IV SOLN
2.0000 g | Freq: Two times a day (BID) | INTRAVENOUS | Status: DC
  Administered 2022-05-02 – 2022-05-05 (×7): 2 g via INTRAVENOUS
  Filled 2022-05-02 (×7): qty 12.5

## 2022-05-02 MED ORDER — VANCOMYCIN HCL 1250 MG/250ML IV SOLN
1250.0000 mg | INTRAVENOUS | Status: DC
  Administered 2022-05-03 – 2022-05-04 (×3): 1250 mg via INTRAVENOUS
  Filled 2022-05-02 (×5): qty 250

## 2022-05-02 MED ORDER — FOLIC ACID 1 MG PO TABS
1.0000 mg | ORAL_TABLET | Freq: Every day | ORAL | Status: DC
  Administered 2022-05-02 – 2022-05-14 (×12): 1 mg via ORAL
  Filled 2022-05-02 (×11): qty 1

## 2022-05-02 MED ORDER — LACTATED RINGERS IV BOLUS
1000.0000 mL | Freq: Once | INTRAVENOUS | Status: AC
  Administered 2022-05-02: 1000 mL via INTRAVENOUS

## 2022-05-02 MED ORDER — IPRATROPIUM-ALBUTEROL 20-100 MCG/ACT IN AERS
1.0000 | INHALATION_SPRAY | Freq: Four times a day (QID) | RESPIRATORY_TRACT | Status: DC
  Administered 2022-05-02: 1 via RESPIRATORY_TRACT
  Filled 2022-05-02: qty 4

## 2022-05-02 MED ORDER — GUAIFENESIN-DM 100-10 MG/5ML PO SYRP: 10.0000 mL | ORAL_SOLUTION | ORAL | Status: DC | PRN

## 2022-05-02 MED ORDER — VITAMIN C 500 MG PO TABS
500.0000 mg | ORAL_TABLET | Freq: Every day | ORAL | Status: DC
  Administered 2022-05-02 – 2022-05-14 (×12): 500 mg via ORAL
  Filled 2022-05-02 (×12): qty 1

## 2022-05-02 MED ORDER — METRONIDAZOLE 500 MG/100ML IV SOLN
500.0000 mg | Freq: Two times a day (BID) | INTRAVENOUS | Status: DC
  Administered 2022-05-02 – 2022-05-05 (×7): 500 mg via INTRAVENOUS
  Filled 2022-05-02 (×7): qty 100

## 2022-05-02 MED ORDER — ADULT MULTIVITAMIN W/MINERALS CH
1.0000 | ORAL_TABLET | Freq: Every day | ORAL | Status: DC
  Administered 2022-05-02 – 2022-05-14 (×12): 1 via ORAL
  Filled 2022-05-02 (×12): qty 1

## 2022-05-02 MED ORDER — ZINC OXIDE 40 % EX OINT
1.0000 | TOPICAL_OINTMENT | Freq: Two times a day (BID) | CUTANEOUS | Status: DC
  Administered 2022-05-02 – 2022-05-14 (×25): 1 via TOPICAL
  Filled 2022-05-02: qty 57

## 2022-05-02 MED ORDER — PANTOPRAZOLE SODIUM 40 MG PO TBEC
40.0000 mg | DELAYED_RELEASE_TABLET | Freq: Every day | ORAL | Status: DC
  Administered 2022-05-02 – 2022-05-14 (×12): 40 mg via ORAL
  Filled 2022-05-02 (×12): qty 1

## 2022-05-02 MED ORDER — DEXAMETHASONE SODIUM PHOSPHATE 10 MG/ML IJ SOLN
10.0000 mg | Freq: Four times a day (QID) | INTRAMUSCULAR | Status: AC
  Administered 2022-05-02 – 2022-05-03 (×4): 10 mg via INTRAVENOUS
  Filled 2022-05-02 (×4): qty 1

## 2022-05-02 MED ORDER — LABETALOL HCL 5 MG/ML IV SOLN
10.0000 mg | INTRAVENOUS | Status: DC | PRN
  Administered 2022-05-02 – 2022-05-03 (×2): 10 mg via INTRAVENOUS
  Filled 2022-05-02: qty 4

## 2022-05-02 MED ORDER — METHYLPHENIDATE HCL 5 MG PO TABS
5.0000 mg | ORAL_TABLET | Freq: Two times a day (BID) | ORAL | Status: DC
  Administered 2022-05-04 – 2022-05-14 (×18): 5 mg via ORAL
  Filled 2022-05-02 (×20): qty 1

## 2022-05-02 MED ORDER — INSULIN ASPART 100 UNIT/ML IJ SOLN
0.0000 [IU] | INTRAMUSCULAR | Status: DC
  Administered 2022-05-02: 3 [IU] via SUBCUTANEOUS
  Administered 2022-05-02: 1 [IU] via SUBCUTANEOUS
  Administered 2022-05-02 (×2): 2 [IU] via SUBCUTANEOUS
  Administered 2022-05-02: 3 [IU] via SUBCUTANEOUS
  Administered 2022-05-03 (×2): 1 [IU] via SUBCUTANEOUS
  Administered 2022-05-03: 3 [IU] via SUBCUTANEOUS
  Administered 2022-05-03 (×2): 2 [IU] via SUBCUTANEOUS

## 2022-05-02 MED ORDER — LACTATED RINGERS IV SOLN: INTRAVENOUS | Status: DC

## 2022-05-02 MED ORDER — APIXABAN 5 MG PO TABS
5.0000 mg | ORAL_TABLET | Freq: Two times a day (BID) | ORAL | Status: DC
  Administered 2022-05-02 – 2022-05-14 (×23): 5 mg via ORAL
  Filled 2022-05-02 (×24): qty 1

## 2022-05-02 MED ORDER — SODIUM CHLORIDE 0.9 % IV SOLN
200.0000 mg | Freq: Once | INTRAVENOUS | Status: AC
  Administered 2022-05-02: 200 mg via INTRAVENOUS
  Filled 2022-05-02: qty 40

## 2022-05-02 MED ORDER — IOHEXOL 350 MG/ML SOLN
75.0000 mL | Freq: Once | INTRAVENOUS | Status: AC | PRN
  Administered 2022-05-02: 75 mL via INTRAVENOUS

## 2022-05-02 MED ORDER — ZINC SULFATE 220 (50 ZN) MG PO CAPS
220.0000 mg | ORAL_CAPSULE | Freq: Every day | ORAL | Status: DC
  Administered 2022-05-02 – 2022-05-14 (×12): 220 mg via ORAL
  Filled 2022-05-02 (×12): qty 1

## 2022-05-02 MED ORDER — BISACODYL 10 MG RE SUPP: 10.0000 mg | Freq: Every day | RECTAL | Status: DC | PRN

## 2022-05-02 MED ORDER — ROSUVASTATIN CALCIUM 20 MG PO TABS
20.0000 mg | ORAL_TABLET | Freq: Every day | ORAL | Status: DC
  Administered 2022-05-03 – 2022-05-13 (×11): 20 mg via ORAL
  Filled 2022-05-02 (×12): qty 1

## 2022-05-02 NOTE — ED Notes (Signed)
Pt family member at bedside states that pt is lethargic the day after therapy sessions at baseline. Pt more alert and able to stay awake with family at bedside. Family states patient should be able to take pills and is able to eat fulls meals. Attempted to give PO medications but pt continually tried to chew pills, did not have good suck on straw to swallow water and pills were pocketing in cheeks. Eventually got all pills down, but would not recommend PO meds at this time.

## 2022-05-02 NOTE — Consult Note (Signed)
Reason for Consult:Left foot ulcer Referring Physician: Marylu Lund Time called: [id number] Time at bedside: Cassandra Dodson is an 82 y.o. female.  HPI: Cassandra Dodson was brought to the ED from SNF with SOB and AMS. She was found to have Covid. She was also noted to have a large left heel ulcer. X-rays showed possible osteo and orthopedic surgery was consulted. She remains obtunded and cannot contribute to history.  Past Medical History:  Diagnosis Date   Arthritis    CKD (chronic kidney disease), stage III ([place])    patient denies   Depression    Diabetes mellitus without complication ([place])    Difficult intravenous access    Endometrial cancer ([place])    GERD (gastroesophageal reflux disease)    Headache    History of radiation therapy 11/04/2019-12/01/2019   Endometrial HDR; Dr. Gery Pray   Hypertension    Hypothyroidism    Neuromuscular disorder ([place])    neuropathy in feet   Osteoarthritis    PMB (postmenopausal bleeding)    PONV (postoperative nausea and vomiting)    severe nausea and vomiting after knee replacement 06-2014, did ok with 2018 knee replacement   Urinary frequency    Wears glasses     Past Surgical History:  Procedure Laterality Date   BREAST SURGERY     cyst removed   CESAREAN SECTION     DILATION AND CURETTAGE OF UTERUS N/A 06/24/2019   Procedure: DILATATION AND CURETTAGE;  Surgeon: Lafonda Mosses, MD;  Location: [facility];  Service: Gynecology;  Laterality: N/A;   FRACTURE SURGERY     right knee   INTRAUTERINE DEVICE (IUD) INSERTION N/A 06/24/2019   Procedure: INTRAUTERINE DEVICE (IUD) INSERTION MIRENA;  Surgeon: Lafonda Mosses, MD;  Location: [facility];  Service: Gynecology;  Laterality: N/A;   IR ANGIO INTRA EXTRACRAN SEL COM CAROTID INNOMINATE UNI R MOD SED  11/05/2021   IR ANGIO VERTEBRAL SEL VERTEBRAL UNI R MOD SED  11/05/2021   IR ANGIOGRAM PULMONARY BILATERAL SELECTIVE  01/28/2022   IR ANGIOGRAM SELECTIVE EACH  ADDITIONAL VESSEL  01/28/2022   IR ANGIOGRAM SELECTIVE EACH ADDITIONAL VESSEL  01/28/2022   IR CT HEAD LTD  11/01/2021   IR INTRA CRAN STENT  11/01/2021   IR RADIOLOGIST EVAL & MGMT  12/19/2021   IR THROMBECT PRIM MECH INIT (INCLU) MOD SED  01/28/2022   IR THROMBECT PRIM MECH INIT (INCLU) MOD SED  01/28/2022   IR US GUIDE VASC ACCESS RIGHT  11/01/2021   IR US GUIDE VASC ACCESS RIGHT  01/28/2022   IRRIGATION AND DEBRIDEMENT SEBACEOUS CYST     JOINT REPLACEMENT Right    right   LYMPH NODE DISSECTION N/A 09/14/2019   Procedure: LYMPH NODE DISSECTION;  Surgeon: Lafonda Mosses, MD;  Location: [facility];  Service: Gynecology;  Laterality: N/A;   RADIOLOGY WITH ANESTHESIA N/A 11/01/2021   Procedure: ANGIOPLASTY/STENT;  Surgeon: Luanne Bras, MD;  Location: [facility];  Service: Radiology;  Laterality: N/A;   RADIOLOGY WITH ANESTHESIA N/A 01/28/2022   Procedure: IR WITH ANESTHESIA;  Surgeon: Radiologist, Medication, MD;  Location: [facility];  Service: Radiology;  Laterality: N/A;   ROBOTIC ASSISTED TOTAL HYSTERECTOMY WITH BILATERAL SALPINGO OOPHERECTOMY Bilateral 09/14/2019   Procedure: XI ROBOTIC ASSISTED TOTAL HYSTERECTOMY WITH BILATERAL SALPINGO OOPHORECTOMY;  Surgeon: Lafonda Mosses, MD;  Location[facility];  Service: Gynecology;  Laterality: Bilateral;   SENTINEL NODE BIOPSY N/A 09/14/2019   Procedure: SENTINEL NODE BIOPSY;  Surgeon: Jeral Pinch  R, MD;  Location: [facility];  Service: Gynecology;  Laterality: N/A;   teeth extration     TONSILLECTOMY     TOTAL HIP ARTHROPLASTY Right 02/23/2015   Procedure: RIGHT TOTAL HIP ARTHROPLASTY ANTERIOR APPROACH;  Surgeon: Leandrew Koyanagi, MD;  Location: [place] [facility];  Service: Orthopedics;  Laterality: Right;   TOTAL KNEE ARTHROPLASTY Left 06/27/2016   Procedure: LEFT TOTAL KNEE ARTHROPLASTY;  Surgeon: Leandrew Koyanagi, MD;  Location: [facility];  Service: Orthopedics;  Laterality: Left;    Family History  Problem Relation Age of Onset   Pancreatic cancer Mother    Stroke  Father    Hypertension Father    Colon cancer Neg Hx    Breast cancer Neg Hx    Ovarian cancer Neg Hx    Endometrial cancer Neg Hx    Prostate cancer Neg Hx     Social History:  reports that she quit smoking about 20 years ago. Her smoking use included cigarettes. She has a 67.50 pack-year smoking history. She has never used smokeless tobacco. She reports current alcohol use. She reports that she does not use drugs.  Allergies:  Allergies  Allergen Reactions   Anesthetics, Amide Nausea And Vomiting    Pt is intolerant to general anesthesia. Pt will throw up and has thrown up during procedure.    Ether Nausea And Vomiting    Medications: I have reviewed the patient's current medications.  Results for orders placed or performed during the hospital encounter of 05/01/22 (from the past 48 hour(s))  Brain natriuretic peptide     Status: Abnormal   Collection Time: 05/01/22  8:42 PM  Result Value Ref Range   B Natriuretic Peptide 107.2 (H) 0.0 - 100.0 pg/mL    Comment: Performed at [facility], [address], [place], [place] [address]  CBC with Differential     Status: Abnormal   Collection Time: 05/01/22  9:07 PM  Result Value Ref Range   WBC 11.2 (H) 4.0 - 10.5 K/uL   RBC 3.83 (L) 3.87 - 5.11 MIL/uL   Hemoglobin 10.3 (L) 12.0 - 15.0 g/dL   HCT 33.5 (L) 36.0 - 46.0 %   MCV 87.5 80.0 - 100.0 fL   MCH 26.9 26.0 - 34.0 pg   MCHC 30.7 30.0 - 36.0 g/dL   RDW 14.9 11.5 - 15.5 %   Platelets 325 150 - 400 K/uL   nRBC 0.0 0.0 - 0.2 %   Neutrophils Relative % 81 %   Neutro Abs 9.0 (H) 1.7 - 7.7 K/uL   Lymphocytes Relative 10 %   Lymphs Abs 1.1 0.7 - 4.0 K/uL   Monocytes Relative 8 %   Monocytes Absolute 0.9 0.1 - 1.0 K/uL   Eosinophils Relative 1 %   Eosinophils Absolute 0.1 0.0 - 0.5 K/uL   Basophils Relative 0 %   Basophils Absolute 0.1 0.0 - 0.1 K/uL   Immature Granulocytes 0 %   Abs Immature Granulocytes 0.05 0.00 - 0.07 K/uL    Comment: Performed at [facility] [address] [place], [place] [address]  Comprehensive metabolic panel     Status: Abnormal   Collection Time: 05/01/22  9:07 PM  Result Value Ref Range   Sodium 129 (L) 135 - 145 mmol/L   Potassium 3.9 3.5 - 5.1 mmol/L    Comment: HEMOLYSIS AT THIS LEVEL MAY AFFECT RESULT   Chloride 93 (L) 98 - 111 mmol/L   CO2 25 22 - 32 mmol/L  Glucose, Bld 237 (H) 70 - 99 mg/dL    Comment: Glucose reference range applies only to samples taken after fasting for at least 8 hours.   BUN 12 8 - 23 mg/dL   Creatinine, Ser 0.77 0.44 - 1.00 mg/dL   Calcium 9.3 8.9 - 10.3 mg/dL   Total Protein 5.9 (L) 6.5 - 8.1 g/dL   Albumin 2.0 (L) 3.5 - 5.0 g/dL   AST 50 (H) 15 - 41 U/L    Comment: HEMOLYSIS AT THIS LEVEL MAY AFFECT RESULT   ALT 48 (H) 0 - 44 U/L    Comment: HEMOLYSIS AT THIS LEVEL MAY AFFECT RESULT   Alkaline Phosphatase 78 38 - 126 U/L   Total Bilirubin 1.0 0.3 - 1.2 mg/dL    Comment: HEMOLYSIS AT THIS LEVEL MAY AFFECT RESULT   GFR, Estimated >60 >60 mL/min    Comment: (NOTE) Calculated using the CKD-EPI Creatinine Equation (2021)    Anion gap 11 5 - 15    Comment: Performed at [facility], [place] [address] [place], [place] [address]  Troponin I (High Sensitivity)     Status: None   Collection Time: 05/01/22  9:07 PM  Result Value Ref Range   Troponin I (High Sensitivity) 10 <18 ng/L    Comment: (NOTE) Elevated high sensitivity troponin I (hsTnI) values and significant  changes across serial measurements may suggest ACS but many other  chronic and acute conditions are known to elevate hsTnI results.  Refer to the "Links" section for chest pain algorithms and additional  guidance. Performed at [facility], [place] [address], [place], [place] [address]   Resp panel by RT-PCR (RSV, Flu A&B, Covid) Anterior Nasal Swab     Status: Abnormal   Collection Time: 05/01/22  9:07 PM   Specimen: Anterior Nasal Swab  Result Value Ref Range   SARS Coronavirus 2 by RT PCR  POSITIVE (A) NEGATIVE    Comment: (NOTE) SARS-CoV-2 target nucleic acids are DETECTED.  The SARS-CoV-2 RNA is generally detectable in upper respiratory specimens during the acute phase of infection. Positive results are indicative of the presence of the identified virus, but do not rule out bacterial infection or co-infection with other pathogens not detected by the test. Clinical correlation with patient history and other diagnostic information is necessary to determine patient infection status. The expected result is Negative.  Fact Sheet for Patients: EntrepreneurPulse.com.au  Fact Sheet for Healthcare Providers: IncredibleEmployment.be  This test is not yet approved or cleared by the [place] FDA and  has been authorized for detection and/or diagnosis of SARS-CoV-2 by FDA under an Emergency Use Authorization (EUA).  This EUA will remain in effect (meaning this test can be used) for the duration of  the COVID-19 declaration under Section 564(b)(1) of the A ct, 21 U.S.C. section 360bbb-3(b)(1), unless the authorization is terminated or revoked sooner.     Influenza A by PCR NEGATIVE NEGATIVE   Influenza B by PCR NEGATIVE NEGATIVE    Comment: (NOTE) The Xpert Xpress SARS-CoV-2/FLU/RSV plus assay is intended as an aid in the diagnosis of influenza from Nasopharyngeal swab specimens and should not be used as a sole basis for treatment. Nasal washings and aspirates are unacceptable for Xpert Xpress SARS-CoV-2/FLU/RSV testing.  Fact Sheet for Patients: EntrepreneurPulse.com.au  Fact Sheet for Healthcare Providers: IncredibleEmployment.be  This test is not yet approved or cleared by the [place] FDA and has been authorized for detection and/or diagnosis of SARS-CoV-2 by FDA under an  Emergency Use Authorization (EUA). This EUA will remain in effect (meaning this test can be used) for the duration  of the COVID-19 declaration under Section 564(b)(1) of the Act, 21 U.S.C. section 360bbb-3(b)(1), unless the authorization is terminated or revoked.     Resp Syncytial Virus by PCR NEGATIVE NEGATIVE    Comment: (NOTE) Fact Sheet for Patients: EntrepreneurPulse.com.au  Fact Sheet for Healthcare Providers: IncredibleEmployment.be  This test is not yet approved or cleared by the [place] FDA and has been authorized for detection and/or diagnosis of SARS-CoV-2 by FDA under an Emergency Use Authorization (EUA). This EUA will remain in effect (meaning this test can be used) for the duration of the COVID-19 declaration under Section 564(b)(1) of the Act, 21 U.S.C. section 360bbb-3(b)(1), unless the authorization is terminated or revoked.  Performed at [facility], [place] [address], [place], [place] [address]   Lactic acid, plasma     Status: Abnormal   Collection Time: 05/01/22 10:17 PM  Result Value Ref Range   Lactic Acid, Venous 3.2 (HH) 0.5 - 1.9 mmol/L    Comment: CRITICAL RESULT CALLED TO, READ BACK BY AND VERIFIED WITH J.NOVIELLO,RN. 0003 05/02/22. LPAIT Performed at [facility], [place] [address], [place], [place] [address]   Troponin I (High Sensitivity)     Status: None   Collection Time: 05/01/22 10:41 PM  Result Value Ref Range   Troponin I (High Sensitivity) 10 <18 ng/L    Comment: (NOTE) Elevated high sensitivity troponin I (hsTnI) values and significant  changes across serial measurements may suggest ACS but many other  chronic and acute conditions are known to elevate hsTnI results.  Refer to the "Links" section for chest pain algorithms and additional  guidance. Performed at [facility], [place] [address], [place], [place] [address]   Lactic acid, plasma     Status: None   Collection Time: 05/02/22 12:25 AM  Result Value Ref Range   Lactic Acid, Venous 1.3 0.5 - 1.9 mmol/L    Comment: Performed at [facility] [address] [place] [address] [place], [place] [address]  Urinalysis, Routine w reflex microscopic     Status: Abnormal   Collection Time: 05/02/22  3:12 AM  Result Value Ref Range   Color, Urine YELLOW YELLOW   APPearance CLOUDY (A) CLEAR   Specific Gravity, Urine 1.015 1.005 - 1.030   pH 6.0 5.0 - 8.0   Glucose, UA NEGATIVE NEGATIVE mg/dL   Hgb urine dipstick MODERATE (A) NEGATIVE   Bilirubin Urine NEGATIVE NEGATIVE   Ketones, ur NEGATIVE NEGATIVE mg/dL   Protein, ur NEGATIVE NEGATIVE mg/dL   Nitrite NEGATIVE NEGATIVE   Leukocytes,Ua LARGE (A) NEGATIVE   RBC / HPF >50 (H) 0 - 5 RBC/hpf   WBC, UA >50 (H) 0 - 5 WBC/hpf   Bacteria, UA FEW (A) NONE SEEN   Squamous Epithelial / HPF 6-10 0 - 5 /HPF   WBC Clumps PRESENT    Mucus PRESENT    Budding Yeast PRESENT     Comment: Performed at [facility], [place] [address], [place], [place] [address]  D-dimer, quantitative     Status: Abnormal   Collection Time: 05/02/22  5:52 AM  Result Value Ref Range   D-Dimer, Quant 1.26 (H) 0.00 - 0.50 ug/mL-FEU    Comment: (NOTE) At the manufacturer cut-off value of 0.5 g/mL FEU, this assay has a negative predictive value of 95-100%.This assay is intended for use in conjunction with a  clinical pretest probability (PTP) assessment model to exclude pulmonary embolism (PE) and deep venous thrombosis (DVT) in outpatients suspected of PE or DVT. Results should be correlated with clinical presentation. Performed at [facility], [place] [address], [place], [place] [address]   Lactate dehydrogenase     Status: None   Collection Time: 05/02/22  5:52 AM  Result Value Ref Range   LDH 127 98 - 192 U/L    Comment: Performed at [facility], [place] [address], [place], [place] [address]  C-reactive protein     Status: Abnormal   Collection Time: 05/02/22  5:52 AM  Result Value Ref Range   CRP 13.8 (H) <1.0 mg/dL    Comment: Performed at [facility] [address] [place], [place]  [address]  Comprehensive metabolic panel     Status: Abnormal   Collection Time: 05/02/22  5:52 AM  Result Value Ref Range   Sodium 135 135 - 145 mmol/L   Potassium 4.0 3.5 - 5.1 mmol/L   Chloride 100 98 - 111 mmol/L   CO2 28 22 - 32 mmol/L   Glucose, Bld 245 (H) 70 - 99 mg/dL    Comment: Glucose reference range applies only to samples taken after fasting for at least 8 hours.   BUN 10 8 - 23 mg/dL   Creatinine, Ser 0.66 0.44 - 1.00 mg/dL   Calcium 9.3 8.9 - 10.3 mg/dL   Total Protein 5.6 (L) 6.5 - 8.1 g/dL   Albumin 1.8 (L) 3.5 - 5.0 g/dL   AST 33 15 - 41 U/L   ALT 41 0 - 44 U/L   Alkaline Phosphatase 70 38 - 126 U/L   Total Bilirubin 0.3 0.3 - 1.2 mg/dL   GFR, Estimated >60 >60 mL/min    Comment: (NOTE) Calculated using the CKD-EPI Creatinine Equation (2021)    Anion gap 7 5 - 15    Comment: Performed at [facility], [place] [address] [place], [place] [address]  Magnesium     Status: Abnormal   Collection Time: 05/02/22  5:52 AM  Result Value Ref Range   Magnesium 1.6 (L) 1.7 - 2.4 mg/dL    Comment: Performed at [facility] [address] [place], [place] [address]  CBC     Status: Abnormal   Collection Time: 05/02/22  6:15 AM  Result Value Ref Range   WBC 6.0 4.0 - 10.5 K/uL   RBC 3.91 3.87 - 5.11 MIL/uL   Hemoglobin 10.5 (L) 12.0 - 15.0 g/dL   HCT 34.1 (L) 36.0 - 46.0 %   MCV 87.2 80.0 - 100.0 fL   MCH 26.9 26.0 - 34.0 pg   MCHC 30.8 30.0 - 36.0 g/dL   RDW 14.7 11.5 - 15.5 %   Platelets 243 150 - 400 K/uL   nRBC 0.0 0.0 - 0.2 %    Comment: Performed at [facility], [place] [address] [place], [place] [address]  CBG monitoring, ED     Status: Abnormal   Collection Time: 05/02/22  6:25 AM  Result Value Ref Range   Glucose-Capillary 231 (H) 70 - 99 mg/dL    Comment: Glucose reference range applies only to samples taken after fasting for at least 8 hours.  CBG monitoring, ED     Status: Abnormal   Collection Time: 05/02/22  8:45 AM  Result Value Ref  Range   Glucose-Capillary 242 (H) 70 - 99 mg/dL    Comment:  Glucose reference range applies only to samples taken after fasting for at least 8 hours.    CT ABDOMEN PELVIS W CONTRAST  Result Date: 05/02/2022 CLINICAL DATA:  Study identified as missing a report at 4:02 am on 05/02/2022. 82 year old female with abdomen and flank pain. Increasing shortness of breath. EXAM: CT ABDOMEN AND PELVIS WITH CONTRAST TECHNIQUE: Multidetector CT imaging of the abdomen and pelvis was performed using the standard protocol following bolus administration of intravenous contrast. RADIATION DOSE REDUCTION: This exam was performed according to the departmental dose-optimization program which includes automated exposure control, adjustment of the mA and/or kV according to patient size and/or use of iterative reconstruction technique. CONTRAST:  45m OMNIPAQUE IOHEXOL 350 MG/ML SOLN COMPARISON:  CTA chest from the same time reported separately. Prior CT Abdomen and Pelvis 01/29/2022. FINDINGS: Lower chest: Chest CTA reported separately. Hepatobiliary: Mild motion artifact. Negative liver and gallbladder. Pancreas: Atrophied, otherwise negative. Spleen: Stable and negative. Adrenals/Urinary Tract: Stable adrenal glands. Kidneys appears stable since October with symmetric renal enhancement, and symmetric contrast excretion on delayed images. No nephrolithiasis or suspicious renal lesion. Decompressed ureters. Moderately distended urinary bladder today with mild chronic bladder wall thickening. Estimated bladder volume 520 mL. Stomach/Bowel: Stool ball in the rectum, new since October and up to 12 cm in length (series 9, image 113). Upstream redundant but decompressed sigmoid colon throughout the pelvis. Decompressed descending colon with diverticulosis. Mild redundant transverse colon with retained stool. Right colon retained stool and diverticulosis. Diminutive, normal retrocecal appendix series 13, image 58. No large bowel  inflammation. No dilated small bowel. Decompressed stomach and duodenum. No free air or free fluid. Vascular/Lymphatic: Aortoiliac calcified atherosclerosis. Normal caliber abdominal aorta. Major arterial structures remain patent. Portal venous system is patent. No lymphadenopathy identified. Reproductive: Surgically absent uterus. Diminutive or absent ovaries. Other: No pelvic free fluid. Presacral stranding has decreased since October but not resolved. Musculoskeletal: Advanced lumbar spine degeneration. Right hip arthroplasty. No acute osseous abnormality identified. IMPRESSION: 1. Stool ball in the rectum up to 12 cm in length, suspicious for Fecal Impaction. 2. Distended urinary bladder (520 mL) might be urinary retention secondary to #1. 3. No other acute or inflammatory process identified in the abdomen or pelvis. Chest CTA at the same time reported separately. Advanced lumbar spine degeneration. Aortic Atherosclerosis (ICD10-I70.0). Electronically Signed   By: HGenevie AnnM.D.   On: 05/02/2022 04:09   CT Angio Chest PE W and/or Wo Contrast  Result Date: 05/02/2022 CLINICAL DATA:  Worsening shortness of breath. EXAM: CT ANGIOGRAPHY CHEST WITH CONTRAST TECHNIQUE: Multidetector CT imaging of the chest was performed using the standard protocol during bolus administration of intravenous contrast. Multiplanar CT image reconstructions and MIPs were obtained to evaluate the vascular anatomy. RADIATION DOSE REDUCTION: This exam was performed according to the departmental dose-optimization program which includes automated exposure control, adjustment of the mA and/or kV according to patient size and/or use of iterative reconstruction technique. CONTRAST:  71mOMNIPAQUE IOHEXOL 350 MG/ML SOLN COMPARISON:  None Available. FINDINGS: Cardiovascular: There is marked severity calcification of the aortic arch, without evidence of aortic aneurysm. Satisfactory opacification of the pulmonary arteries to the segmental level.  No evidence of pulmonary embolism. Normal heart size with marked severity coronary artery calcification. No pericardial effusion. Mediastinum/Nodes: No enlarged mediastinal, hilar, or axillary lymph nodes. Thyroid gland, trachea, and esophagus demonstrate no significant findings. Lungs/Pleura: Mild to moderate severity areas of scarring and/or atelectasis are seen within the bilateral apices mild posterior bibasilar atelectasis is also noted. There is  no evidence of an acute infiltrate, pleural effusion or pneumothorax. Upper Abdomen: No acute abnormality. Musculoskeletal: Multilevel degenerative changes are seen throughout the thoracic spine. Review of the MIP images confirms the above findings. IMPRESSION: 1. No evidence of pulmonary embolism or other acute intrathoracic process. 2. Mild to moderate severity biapical and bibasilar scarring and/or atelectasis. 3. Marked severity coronary artery calcification. 4. Aortic atherosclerosis. Aortic Atherosclerosis (ICD10-I70.0). Electronically Signed   By: Virgina Norfolk M.D.   On: 05/02/2022 00:46   CT Head Wo Contrast  Result Date: 05/02/2022 CLINICAL DATA:  Altered mental status, nontraumatic (Ped 0-17y) EXAM: CT HEAD WITHOUT CONTRAST TECHNIQUE: Contiguous axial images were obtained from the base of the skull through the vertex without intravenous contrast. RADIATION DOSE REDUCTION: This exam was performed according to the departmental dose-optimization program which includes automated exposure control, adjustment of the mA and/or kV according to patient size and/or use of iterative reconstruction technique. 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 Remote bilateral cerebellar infarcts and right occipital  cortical infarcts are again noted. Remote infarct within the right pons partially obscured by streak artifact but grossly stable. No acute intracranial hemorrhage or infarct. Ventricular size is stable. Vascular: Vascular stent again noted within the basilar artery. No asymmetric hyperdense vasculature at the skull base. Skull: Normal. Negative for fracture or focal lesion. Sinuses/Orbits: No acute finding. Other: Mastoid air cells and middle ear cavities are clear. IMPRESSION: 1. 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 2. 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 Remote right pontine infarct. 3. No acute intracranial hemorrhage or infarct. Electronically Signed   By: Fidela Salisbury M.D.   On: 05/02/2022 00:31   DG Foot Complete Left  Result Date: 05/01/2022 CLINICAL DATA:  Left foot wound. EXAM: LEFT FOOT - COMPLETE 3+ VIEW COMPARISON:  None Available. FINDINGS: There is no evidence of fracture or dislocation. Lytic/sclerotic appearance of the first metatarsal head with hallux valgus deformity. There is mild cortical erosion and periosteal reaction about the posteroinferior aspect of the calcaneus. There is arthropathy of the multiple tarsometatarsal joints. No pericardial effusion or periosteal reaction. Soft tissue wound about the posterior aspect of the calcaneus. Soft tissue edema about the dorsum of the foot. IMPRESSION: 1. Lytic/sclerotic appearance of the first metatarsal head with hallux valgus deformity, likely a chronic processa. 2. Soft tissue wound about the posterior aspect of the calcaneus. 3. There is mild cortical erosion and periosteal reaction about the posteroinferior aspect of the calcaneus, with adjacent soft tissue wound suspicious for osteomyelitis, further  evaluation with MR examination is suggested. Electronically Signed   By: Keane Police D.O.   On: 05/01/2022 22:31   DG Chest Portable 1 View  Result Date: 05/01/2022 CLINICAL DATA:  Shortness of breath EXAM: PORTABLE CHEST 1 VIEW COMPARISON:  03/01/2022 FINDINGS: Stable cardiomediastinal silhouette. Aortic atherosclerotic calcification. No focal consolidation, pleural effusion, or pneumothorax. No acute osseous abnormality. IMPRESSION: No active disease. Electronically Signed   By: Placido Sou M.D.   On: 05/01/2022 21:30    Review of Systems  Unable to perform ROS: Mental status change   Blood pressure 125/73, pulse 76, temperature 97.9 F (36.6 C),  temperature source Axillary, resp. rate 13, SpO2 100 %. Physical Exam Constitutional:      General: She is not in acute distress.    Appearance: She is well-developed. She is not diaphoretic.  HENT:     Head: Normocephalic and atraumatic.  Eyes:     General: No scleral icterus.       Right eye: No discharge.        Left eye: No discharge.     Conjunctiva/sclera: Conjunctivae normal.  Cardiovascular:     Rate and Rhythm: Normal rate and regular rhythm.  Pulmonary:     Effort: Pulmonary effort is normal. No respiratory distress.  Musculoskeletal:     Cervical back: Normal range of motion.  Feet:     Comments: Left foot: Large necrotic calcaceal ulceration with foul odor. No discharge. Faint to nonexistent DP, 0 PT. Could not assess sensation 2/2 AMS. Motor grossly intact. Skin:    General: Skin is warm and dry.  Neurological:     Mental Status: She is alert.  Psychiatric:        Mood and Affect: Mood normal.        Behavior: Behavior normal.     Assessment/Plan: Left heel ulcer -- Will obtain ABI's. If abnormal will need vascular surgery consult. Dr. Sharol Given to evaluate later today or in AM.    Lisette Abu, PA-C Orthopedic Surgery [phone] 05/02/2022, 9:48 AM

## 2022-05-02 NOTE — Progress Notes (Signed)
Pharmacy Antibiotic Note  Cassandra Dodson is a 82 y.o. female admitted on 05/01/2022 with sepsis, COVID-19 infection, wound infection/osteo.  Pharmacy has been consulted for Vancomycin/Cefepime dosing. WBC 11.2. Renal function ok.   Plan: Vancomycin 1250 mg IV q24h >>>Estimated AUC: 542 Cefepime 2g IV q12h Flagyl per MD Trend WBC, temp, renal function  F/U infectious work-up Drug levels as indicated   Temp (24hrs), Avg:98.7 F (37.1 C), Min:98.6 F (37 C), Max:98.8 F (37.1 C)  Recent Labs  Lab 05/01/22 2107 05/01/22 2217 05/02/22 0025  WBC 11.2*  --   --   CREATININE 0.77  --   --   LATICACIDVEN  --  3.2* 1.3    CrCl cannot be calculated (Unknown ideal weight.).    Allergies  Allergen Reactions   Anesthetics, Amide Nausea And Vomiting    Pt is intolerant to general anesthesia. Pt will throw up and has thrown up during procedure.    Ether Nausea And Vomiting    Narda Bonds, PharmD, BCPS Clinical Pharmacist Phone: [phone]

## 2022-05-02 NOTE — Consult Note (Signed)
WOC consult requested for left heel wound.  Performed remotely after review of progress notes and results of X-ray as follows; "There is mild cortical erosion and periosteal reaction about the posteroinferior aspect of the calcaneus, with adjacent soft tissue wound suspicious for osteomyelitis, further evaluation with MRI examination is suggested." Secure chat message sent to the primary team to inform them of the following; this complex medical condition is beyond the scope of practice for [facility] nurses.  Please consult ortho team for further plan of care.  Please re-consult if further assistance is needed.  Thank-you,  Julien Girt MSN, [place], [place], [place], [place]

## 2022-05-02 NOTE — ED Notes (Signed)
Pt woke up, asked how I was doing then returned to drowsy state and mute once again

## 2022-05-02 NOTE — ED Notes (Signed)
MD notified of GCS 9 and inability to take PO meds

## 2022-05-02 NOTE — H&P (Addendum)
History and Physical  CHELCEA ZAHN KVQ:[id number] DOB: 1940/05/01 DOA: 05/01/2022  Referring physician: Dr. Waverly Ferrari, [place]. PCP: Shon Baton, MD  Outpatient Specialists: Neurology, neurosurgery, radiology, oncology. Patient coming from: SNF.  Chief Complaint: Shortness of breath and altered mental status.  HPI: NONIE LOCHNER is a 82 y.o. 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 ED from SNF due to concern for shortness of breath and altered mental status.  Unclear duration of symptoms.  At the time of the visit, the patient is lethargic and is unable to provide a history.  History is mainly obtained from EDP and from review of medical records.  In the ED, COVID-19 screening test positive.  CT scan with no evidence of pulmonary embolism, no significant COVID 19 pneumonitis.  UA positive for pyuria.  Left foot x-ray showing mild cortical erosion and posterior inferior aspect of the calcaneus with adjacent soft tissue bone suspicious for osteomyelitis.  Started on broad-spectrum IV antibiotics IV vancomycin and cefepime.  Also started on remdesivir for COVID-19 viral infection.  CT head without contrast showing no meningioma with significant edema and left-to-right shift.  EDP discussed the case with neurosurgery who recommended IV Decadron every 6 hours.  Neurosurgery will see in consultation.  ED Course: Tmax 98.6.  BP 151/71, pulse 95, respiratory 23, O2 saturation 100% on 4 L.  Lab studies remarkable for serum sodium 129, glucose 237, AST 50, ALT 48, WBC 11.2.  Hemoglobin 10.3.  Neutrophil count 9.0.  Review of Systems: Review of systems as  noted in the HPI. All other systems reviewed and are negative.   Past Medical History:  Diagnosis Date   Arthritis    CKD (chronic kidney disease), stage III ([place])    patient denies   Depression    Diabetes mellitus without complication ([place])    Difficult intravenous access    Endometrial cancer ([place])    GERD (gastroesophageal reflux disease)    Headache    History of radiation therapy 11/04/2019-12/01/2019   Endometrial HDR; Dr. Gery Pray   Hypertension    Hypothyroidism    Neuromuscular disorder ([place])    neuropathy in feet   Osteoarthritis    PMB (postmenopausal bleeding)    PONV (postoperative nausea and vomiting)    severe nausea and vomiting after knee replacement 06-2014, did ok with 2018 knee replacement   Urinary frequency    Wears glasses    Past Surgical History:  Procedure Laterality Date   BREAST SURGERY     cyst removed   CESAREAN SECTION     DILATION AND CURETTAGE OF UTERUS N/A 06/24/2019   Procedure: DILATATION AND CURETTAGE;  Surgeon: Lafonda Mosses, MD;  Location: [facility];  Service: Gynecology;  Laterality: N/A;   FRACTURE SURGERY     right knee   INTRAUTERINE DEVICE (IUD) INSERTION N/A 06/24/2019   Procedure: INTRAUTERINE DEVICE (IUD) INSERTION MIRENA;  Surgeon: Lafonda Mosses, MD;  Location: [facility];  Service: Gynecology;  Laterality: N/A;   IR ANGIO INTRA EXTRACRAN SEL COM CAROTID INNOMINATE UNI R MOD SED  11/05/2021   IR ANGIO VERTEBRAL SEL VERTEBRAL UNI R MOD SED  11/05/2021   IR ANGIOGRAM PULMONARY BILATERAL SELECTIVE  01/28/2022  IR ANGIOGRAM SELECTIVE EACH ADDITIONAL VESSEL  01/28/2022   IR ANGIOGRAM SELECTIVE EACH ADDITIONAL VESSEL  01/28/2022   IR CT HEAD LTD  11/01/2021   IR INTRA CRAN STENT  11/01/2021   IR RADIOLOGIST EVAL & MGMT  12/19/2021   IR THROMBECT PRIM MECH INIT (INCLU) MOD SED  01/28/2022   IR THROMBECT PRIM MECH INIT (INCLU) MOD SED  01/28/2022   IR US GUIDE VASC ACCESS RIGHT  11/01/2021   IR  US GUIDE VASC ACCESS RIGHT  01/28/2022   IRRIGATION AND DEBRIDEMENT SEBACEOUS CYST     JOINT REPLACEMENT Right    right   LYMPH NODE DISSECTION N/A 09/14/2019   Procedure: LYMPH NODE DISSECTION;  Surgeon: Lafonda Mosses, MD;  Location: [facility];  Service: Gynecology;  Laterality: N/A;   RADIOLOGY WITH ANESTHESIA N/A 11/01/2021   Procedure: ANGIOPLASTY/STENT;  Surgeon: Luanne Bras, MD;  Location: [place];  Service: Radiology;  Laterality: N/A;   RADIOLOGY WITH ANESTHESIA N/A 01/28/2022   Procedure: IR WITH ANESTHESIA;  Surgeon: Radiologist, Medication, MD;  Location: [place];  Service: Radiology;  Laterality: N/A;   ROBOTIC ASSISTED TOTAL HYSTERECTOMY WITH BILATERAL SALPINGO OOPHERECTOMY Bilateral 09/14/2019   Procedure: XI ROBOTIC ASSISTED TOTAL HYSTERECTOMY WITH BILATERAL SALPINGO OOPHORECTOMY;  Surgeon: Lafonda Mosses, MD;  Location: [facility];  Service: Gynecology;  Laterality: Bilateral;   SENTINEL NODE BIOPSY N/A 09/14/2019   Procedure: SENTINEL NODE BIOPSY;  Surgeon: Lafonda Mosses, MD;  Location: [facility];  Service: Gynecology;  Laterality: N/A;   teeth extration     TONSILLECTOMY     TOTAL HIP ARTHROPLASTY Right 02/23/2015   Procedure: RIGHT TOTAL HIP ARTHROPLASTY ANTERIOR APPROACH;  Surgeon: Leandrew Koyanagi, MD;  Location: [facility];  Service: Orthopedics;  Laterality: Right;   TOTAL KNEE ARTHROPLASTY Left 06/27/2016   Procedure: LEFT TOTAL KNEE ARTHROPLASTY;  Surgeon: Leandrew Koyanagi, MD;  Location: [place] [facility];  Service: Orthopedics;  Laterality: Left;    Social History:  reports that she quit smoking about 20 years ago. Her smoking use included cigarettes. She has a 67.50 pack-year smoking history. She has never used smokeless tobacco. She reports current alcohol use. She reports that she does not use drugs.   Allergies  Allergen Reactions   Anesthetics, Amide Nausea And Vomiting    Pt is intolerant to general anesthesia. Pt will throw up and has thrown up during procedure.    Ether Nausea  And Vomiting    Family History  Problem Relation Age of Onset   Pancreatic cancer Mother    Stroke Father    Hypertension Father    Colon cancer Neg Hx    Breast cancer Neg Hx    Ovarian cancer Neg Hx    Endometrial cancer Neg Hx    Prostate cancer Neg Hx       Prior to Admission medications   Medication Sig Start Date End Date Taking? Authorizing Provider  antiseptic oral rinse (BIOTENE) LIQD 15 mLs by Mouth Rinse route daily as needed for dry mouth.    [provider]  apixaban (ELIQUIS) 5 MG TABS tablet Take 1 tablet (5 mg total) by mouth 2 (two) times daily. 02/16/22   Lorella Nimrod, MD  aspirin EC 81 MG tablet Take 1 tablet (81 mg total) by mouth daily. Swallow whole. 03/07/22   Little Ishikawa, MD  bisacodyl (DULCOLAX) 10 MG suppository Place 10 mg rectally daily as needed (constipation not relieved by Milk of Magnesia).    [provider]  docusate  sodium (COLACE) 100 MG capsule Take 1 capsule (100 mg total) by mouth 2 (two) times daily. 02/16/22   Lorella Nimrod, MD  fenofibrate 160 MG tablet Take 1 tablet (160 mg total) by mouth daily after breakfast. Patient taking differently: Take 160 mg by mouth daily. 11/27/21   Love, Ivan Anchors, PA-C  folic acid (FOLVITE) 1 MG tablet Take 1 tablet (1 mg total) by mouth daily. 11/27/21   Love, Ivan Anchors, PA-C  insulin glargine (LANTUS) 100 UNIT/ML Solostar Pen Inject 15 Units into the skin daily. Patient taking differently: Inject 15 Units into the skin at bedtime. 02/16/22   Lorella Nimrod, MD  insulin lispro (HUMALOG) 100 UNIT/ML KwikPen Inject 3 Units into the skin 3 (three) times daily with meals. Patient taking differently: Inject 3 Units into the skin 3 (three) times daily. 02/16/22   Lorella Nimrod, MD  Insulin Pen Needle 32G X 4 MM MISC Use daily in the morning, at noon, and at bedtime. 11/27/21   Love, Ivan Anchors, PA-C  Magnesium Hydroxide (MILK OF MAGNESIA PO) Take 30 mLs by mouth daily as needed (constipation).     [provider]  methylphenidate (RITALIN) 5 MG tablet Take 1 tablet (5 mg total) by mouth 2 (two) times daily with breakfast and lunch. Patient taking differently: Take 5 mg by mouth 2 (two) times daily. 11/27/21   Love, Ivan Anchors, PA-C  Nutritional Supplements (NUTRITIONAL DRINK MIX PO) Take 1 Dose by mouth 3 (three) times daily. Magic Cup    [provider]  pantoprazole (PROTONIX) 40 MG tablet Take 1 tablet (40 mg total) by mouth daily. Patient taking differently: Take 40 mg by mouth in the morning. 11/27/21   Love, Ivan Anchors, PA-C  rosuvastatin (CRESTOR) 20 MG tablet Take 1 tablet (20 mg total) by mouth daily. Patient taking differently: Take 20 mg by mouth at bedtime. 11/27/21   Love, Ivan Anchors, PA-C  senna (SENOKOT) 8.6 MG TABS tablet Take 1.5 tablets (12.9 mg total) by mouth at bedtime as needed. Patient taking differently: Take 12.9 mg by mouth at bedtime as needed (constipation). 02/16/22   Lorella Nimrod, MD  Sodium Phosphates (RA SALINE ENEMA RE) Place 1 enema rectally daily as needed (constipation not relieved by bisacodyl suppository).    [provider]  Zinc Oxide (DESITIN) 13 % CREA Apply 1 application  topically 2 (two) times daily. To buttocks for protection. Patient taking differently: Apply 1 application  topically 2 (two) times daily. To buttocks 11/27/21   Love, Ivan Anchors, PA-C    Physical Exam: BP (!) 152/84   Pulse 99   Temp 98.8 F (37.1 C) (Oral)   Resp 20   SpO2 92%   General: 82 y.o. year-old female well developed well nourished in no acute distress.  Lethargic. Cardiovascular: Regular rate and rhythm with no rubs or gallops.  No thyromegaly or JVD noted.  Trace lower extremity edema.  Respiratory: Clear to auscultation with no wheezes or rales.  Poor inspiratory effort. Abdomen: Soft nontender nondistended with normal bowel sounds x4 quadrants. Muskuloskeletal: No cyanosis, clubbing or edema noted bilaterally Neuro: CN II-XII intact, strength,  sensation, reflexes Skin: Left heel pressure wound. Psychiatry: Judgement and insight appear altered. Mood is appropriate for condition and setting          Labs on Admission:  Basic Metabolic Panel: Recent Labs  Lab 05/01/22 2107  NA 129*  K 3.9  CL 93*  CO2 25  GLUCOSE 237*  BUN 12  CREATININE 0.77  CALCIUM 9.3   Liver Function Tests: Recent Labs  Lab 05/01/22 2107  AST 50*  ALT 48*  ALKPHOS 78  BILITOT 1.0  PROT 5.9*  ALBUMIN 2.0*   No results for input(s): "LIPASE", "AMYLASE" in the last 168 hours. No results for input(s): "AMMONIA" in the last 168 hours. CBC: Recent Labs  Lab 05/01/22 2107  WBC 11.2*  NEUTROABS 9.0*  HGB 10.3*  HCT 33.5*  MCV 87.5  PLT 325   Cardiac Enzymes: No results for input(s): "CKTOTAL", "CKMB", "CKMBINDEX", "TROPONINI" in the last 168 hours.  BNP (last 3 results) Recent Labs    01/26/22 1452 05/01/22 2042  BNP 140.5* 107.2*    ProBNP (last 3 results) No results for input(s): "PROBNP" in the last 8760 hours.  CBG: No results for input(s): "GLUCAP" in the last 168 hours.  Radiological Exams on Admission: CT Angio Chest PE W and/or Wo Contrast  Result Date: 05/02/2022 CLINICAL DATA:  Worsening shortness of breath. EXAM: CT ANGIOGRAPHY CHEST WITH CONTRAST TECHNIQUE: Multidetector CT imaging of the chest was performed using the standard protocol during bolus administration of intravenous contrast. Multiplanar CT image reconstructions and MIPs were obtained to evaluate the vascular anatomy. RADIATION DOSE REDUCTION: This exam was performed according to the departmental dose-optimization program which includes automated exposure control, adjustment of the mA and/or kV according to patient size and/or use of iterative reconstruction technique. CONTRAST:  69m OMNIPAQUE IOHEXOL 350 MG/ML SOLN COMPARISON:  None Available. FINDINGS: Cardiovascular: There is marked severity calcification of the aortic arch, without evidence of aortic  aneurysm. Satisfactory opacification of the pulmonary arteries to the segmental level. No evidence of pulmonary embolism. Normal heart size with marked severity coronary artery calcification. No pericardial effusion. Mediastinum/Nodes: No enlarged mediastinal, hilar, or axillary lymph nodes. Thyroid gland, trachea, and esophagus demonstrate no significant findings. Lungs/Pleura: Mild to moderate severity areas of scarring and/or atelectasis are seen within the bilateral apices mild posterior bibasilar atelectasis is also noted. There is no evidence of an acute infiltrate, pleural effusion or pneumothorax. Upper Abdomen: No acute abnormality. Musculoskeletal: Multilevel degenerative changes are seen throughout the thoracic spine. Review of the MIP images confirms the above findings. IMPRESSION: 1. No evidence of pulmonary embolism or other acute intrathoracic process. 2. Mild to moderate severity biapical and bibasilar scarring and/or atelectasis. 3. Marked severity coronary artery calcification. 4. Aortic atherosclerosis. Aortic Atherosclerosis (ICD10-I70.0). Electronically Signed   By: TVirgina NorfolkM.D.   On: 05/02/2022 00:46   CT Head Wo Contrast  Result Date: 05/02/2022 CLINICAL DATA:  Altered mental status, nontraumatic (Ped 0-17y) EXAM: CT HEAD WITHOUT CONTRAST TECHNIQUE: Contiguous axial images were obtained from the base of the skull through the vertex without intravenous contrast. RADIATION DOSE REDUCTION: This exam was performed according to the departmental dose-optimization program which includes automated exposure control, adjustment of the mA and/or kV according to patient size and/or use of iterative reconstruction technique. COMPARISON:  03/05/2022, MRI 03/01/2022 FINDINGS: Brain: Minimally enlarged 5.4 cm planum sphenoidale meningioma demonstrating significant mass effect upon the basilar left frontal lobe with extensive vasogenic edema and slightly progressive 18 mm left-to-right midline  shift of the anterior falx. Remote bilateral cerebellar infarcts and right occipital cortical infarcts are again noted. Remote infarct within the right pons partially obscured by streak artifact but grossly stable. No acute intracranial hemorrhage or infarct. Ventricular size is stable. Vascular: Vascular stent again noted within the basilar artery. No asymmetric hyperdense vasculature at the skull base. Skull: Normal. Negative for fracture or focal lesion. Sinuses/Orbits:  No acute finding. Other: Mastoid air cells and middle ear cavities are clear. IMPRESSION: 1. 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 2. 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 Remote right pontine infarct. 3. No acute intracranial hemorrhage or infarct. Electronically Signed   By: Fidela Salisbury M.D.   On: 05/02/2022 00:31   DG Foot Complete Left  Result Date: 05/01/2022 CLINICAL DATA:  Left foot wound. EXAM: LEFT FOOT - COMPLETE 3+ VIEW COMPARISON:  None Available. FINDINGS: There is no evidence of fracture or dislocation. Lytic/sclerotic appearance of the first metatarsal head with hallux valgus deformity. There is mild cortical erosion and periosteal reaction about the posteroinferior aspect of the calcaneus. There is arthropathy of the multiple tarsometatarsal joints. No pericardial effusion or periosteal reaction. Soft tissue wound about the posterior aspect of the calcaneus. Soft tissue edema about the dorsum of the foot. IMPRESSION: 1. Lytic/sclerotic appearance of the first metatarsal head with hallux valgus deformity, likely a chronic processa. 2. Soft tissue wound about the posterior aspect of the calcaneus. 3. There is mild cortical erosion and periosteal reaction about the posteroinferior aspect of the  calcaneus, with adjacent soft tissue wound suspicious for osteomyelitis, further evaluation with MR examination is suggested. Electronically Signed   By: Keane Police D.O.   On: 05/01/2022 22:31   DG Chest Portable 1 View  Result Date: 05/01/2022 CLINICAL DATA:  Shortness of breath EXAM: PORTABLE CHEST 1 VIEW COMPARISON:  03/01/2022 FINDINGS: Stable cardiomediastinal silhouette. Aortic atherosclerotic calcification. No focal consolidation, pleural effusion, or pneumothorax. No acute osseous abnormality. IMPRESSION: No active disease. Electronically Signed   By: Placido Sou M.D.   On: 05/01/2022 21:30    EKG: I independently viewed the EKG done and my findings are as followed: Sinus tachycardia with irregular rate 124.  Nonspecific ST-T changes.  QTc 424.  Assessment/Plan Present on Admission:  AMS (altered mental status)  Principal Problem:   AMS (altered mental status)  Acute metabolic encephalopathy, suspect multifactorial secondary to sepsis, COVID-19 viral infection. Treat underlying conditions Reorient as needed Delirium precautions Aspiration precautions Fall precautions Too somnolent to take p.o. n.p.o. until more alert.  COVID-19 viral infection, POA COVID-19 screening test positive on 05/01/2022. No significant COVID-19 pneumonitis on CT scan. Started on IV remdesivir, vitamin C, zinc Antitussives as needed Inflammatory markers Bronchodilators Antitussives as needed  Acute on chronic hypoxic respiratory failure secondary to COVID-19 viral infection At baseline on 2 L nasal cannula Currently on 4 L  with O2 saturation 100%. Wean off oxygen supplementation as tolerated. Bronchodilators every 6 hours.  Known meningioma CT head without contrast done on 05/02/2022 showed minimally enlarged 5.4 cm planum meningioma with extensive vasogenic edema and slightly progressive 18 mm left-to-right midline shift. Started on IV Decadron per neurosurgery's recommendation.  Left  heel pressure wound/osteomyelitis, POA Concern for possible osteomyelitis, seen on left foot x-ray. Unclear duration of pressure wound Wound care specialist consultation Started on IV vancomycin, IV Flagyl, and cefepime in the ED, continue. Follow blood cultures x 2 peripherally. Local wound care  History of saddle pulmonary embolism Resume home Eliquis  Hyperlipidemia Resume home regimen.  Chronic constipation Bowel regimen  Type 2 diabetes with hyperglycemia Insulin sliding scale every 4 hours while NPO.    Critical care time: 65 minutes.    DVT prophylaxis: Home Eliquis    Code Status:  Full code, the patient is to lethargic to provide her own CODE STATUS.  Family Communication: None at bedside.  Disposition Plan: Admitted to progressive care unit.  Consults called: None  Admission status: Inpatient status.   Status is: Inpatient The patient requires at least 2 midnights for further evaluation and treatment of present condition.   Kayleen Memos MD [facility] Pager [phone]  If 7PM-7AM, please contact night-coverage [email or web address] Password [email or web address]  05/02/2022, 2:04 AM

## 2022-05-02 NOTE — Consult Note (Signed)
Reason for Consult: Patient with known meningioma and mental status change. Referring Physician: Francia Greaves, DO  Cassandra Dodson is an 82 y.o. female.  HPI: The patient is an 82 year old individual who has been known to me for over the past year.  She has a large frontal meningioma measuring greater than 5 cm in size.  There is been vasogenic edema surrounding this lesion.  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  Patient has multiple comorbidities including the severe cerebrovascular disease in addition to the meningioma that makes her an extremely poor risk for surgical intervention for the meningioma.  Over the past year there has been a steady decline in her functional status.  This hospitalization apparently demonstrates that she is COVID-positive.  Is also possible that she is septic.  She has a nonhealing heel ulcer also.  She has been started on some Decadron.  Past Medical History:  Diagnosis Date   Arthritis    CKD (chronic kidney disease), stage III ([place])    patient denies   Depression    Diabetes mellitus without complication ([place])    Difficult intravenous access    Endometrial cancer ([place])    GERD (gastroesophageal reflux disease)    Headache    History of radiation therapy 11/04/2019-12/01/2019   Endometrial HDR; Dr. Gery Pray   Hypertension    Hypothyroidism    Neuromuscular disorder ([place])    neuropathy in feet   Osteoarthritis    PMB (postmenopausal bleeding)    PONV (postoperative nausea and vomiting)    severe nausea and vomiting after knee replacement 06-2014, did ok with 2018 knee replacement   Urinary frequency    Wears glasses     Past Surgical History:  Procedure Laterality Date   BREAST SURGERY     cyst removed   CESAREAN SECTION     DILATION AND CURETTAGE OF UTERUS N/A 06/24/2019   Procedure: DILATATION AND CURETTAGE;  Surgeon: Lafonda Mosses, MD;   Location: [facility];  Service: Gynecology;  Laterality: N/A;   FRACTURE SURGERY     right knee   INTRAUTERINE DEVICE (IUD) INSERTION N/A 06/24/2019   Procedure: INTRAUTERINE DEVICE (IUD) INSERTION MIRENA;  Surgeon: Lafonda Mosses, MD;  Location: [facility];  Service: Gynecology;  Laterality: N/A;   IR ANGIO INTRA EXTRACRAN SEL COM CAROTID INNOMINATE UNI R MOD SED  11/05/2021   IR ANGIO VERTEBRAL SEL VERTEBRAL UNI R MOD SED  11/05/2021   IR ANGIOGRAM PULMONARY BILATERAL SELECTIVE  01/28/2022   IR ANGIOGRAM SELECTIVE EACH ADDITIONAL VESSEL  01/28/2022   IR ANGIOGRAM SELECTIVE EACH ADDITIONAL VESSEL  01/28/2022   IR CT HEAD LTD  11/01/2021   IR INTRA CRAN STENT  11/01/2021   IR RADIOLOGIST EVAL & MGMT  12/19/2021   IR THROMBECT PRIM MECH INIT (INCLU) MOD SED  01/28/2022   IR THROMBECT PRIM MECH INIT (INCLU) MOD SED  01/28/2022   IR US GUIDE VASC ACCESS RIGHT  11/01/2021   IR US GUIDE VASC ACCESS RIGHT  01/28/2022   IRRIGATION AND DEBRIDEMENT SEBACEOUS CYST     JOINT REPLACEMENT Right    right   LYMPH NODE DISSECTION N/A 09/14/2019   Procedure: LYMPH NODE DISSECTION;  Surgeon: Lafonda Mosses, MD;  Location: [facility];  Service: Gynecology;  Laterality: N/A;   RADIOLOGY WITH ANESTHESIA N/A 11/01/2021   Procedure: ANGIOPLASTY/STENT;  Surgeon: Estanislado Pandy,  Willaim Rayas, MD;  Location: [facility];  Service: Radiology;  Laterality: N/A;   RADIOLOGY WITH ANESTHESIA N/A 01/28/2022   Procedure: IR WITH ANESTHESIA;  Surgeon: Radiologist, Medication, MD;  Location: [facility];  Service: Radiology;  Laterality: N/A;   ROBOTIC ASSISTED TOTAL HYSTERECTOMY WITH BILATERAL SALPINGO OOPHERECTOMY Bilateral 09/14/2019   Procedure: XI ROBOTIC ASSISTED TOTAL HYSTERECTOMY WITH BILATERAL SALPINGO OOPHORECTOMY;  Surgeon: Lafonda Mosses, MD;  Location[facility];  Service: Gynecology;  Laterality: Bilateral;   SENTINEL NODE BIOPSY N/A 09/14/2019   Procedure: SENTINEL NODE BIOPSY;  Surgeon: Lafonda Mosses,  MD;  Location[facility];  Service: Gynecology;  Laterality: N/A;   teeth extration     TONSILLECTOMY     TOTAL HIP ARTHROPLASTY Right 02/23/2015   Procedure: RIGHT TOTAL HIP ARTHROPLASTY ANTERIOR APPROACH;  Surgeon: Leandrew Koyanagi, MD;  Location: [facility];  Service: Orthopedics;  Laterality: Right;   TOTAL KNEE ARTHROPLASTY Left 06/27/2016   Procedure: LEFT TOTAL KNEE ARTHROPLASTY;  Surgeon: Leandrew Koyanagi, MD;  Location: [facility];  Service: Orthopedics;  Laterality: Left;    Family History  Problem Relation Age of Onset   Pancreatic cancer Mother    Stroke Father    Hypertension Father    Colon cancer Neg Hx    Breast cancer Neg Hx    Ovarian cancer Neg Hx    Endometrial cancer Neg Hx    Prostate cancer Neg Hx     Social History:  reports that she quit smoking about 20 years ago. Her smoking use included cigarettes. She has a 67.50 pack-year smoking history. She has never used smokeless tobacco. She reports current alcohol use. She reports that she does not use drugs.  Allergies:  Allergies  Allergen Reactions   Anesthetics, Amide Nausea And Vomiting    Pt is intolerant to general anesthesia. Pt will throw up and has thrown up during procedure.    Ether Nausea And Vomiting    Medications: I have reviewed the patient's current medications.  Results for orders placed or performed during the hospital encounter of 05/01/22 (from the past 48 hour(s))  Brain natriuretic peptide     Status: Abnormal   Collection Time: 05/01/22  8:42 PM  Result Value Ref Range   B Natriuretic Peptide 107.2 (H) 0.0 - 100.0 pg/mL    Comment: Performed at [facility], [address] [place], [place] [address]  CBC with Differential     Status: Abnormal   Collection Time: 05/01/22  9:07 PM  Result Value Ref Range   WBC 11.2 (H) 4.0 - 10.5 K/uL   RBC 3.83 (L) 3.87 - 5.11 MIL/uL   Hemoglobin 10.3 (L) 12.0 - 15.0 g/dL   HCT 33.5 (L) 36.0 - 46.0 %   MCV 87.5 80.0 - 100.0 fL   MCH 26.9 26.0 - 34.0 pg   MCHC  30.7 30.0 - 36.0 g/dL   RDW 14.9 11.5 - 15.5 %   Platelets 325 150 - 400 K/uL   nRBC 0.0 0.0 - 0.2 %   Neutrophils Relative % 81 %   Neutro Abs 9.0 (H) 1.7 - 7.7 K/uL   Lymphocytes Relative 10 %   Lymphs Abs 1.1 0.7 - 4.0 K/uL   Monocytes Relative 8 %   Monocytes Absolute 0.9 0.1 - 1.0 K/uL   Eosinophils Relative 1 %   Eosinophils Absolute 0.1 0.0 - 0.5 K/uL   Basophils Relative 0 %   Basophils Absolute 0.1 0.0 - 0.1 K/uL   Immature Granulocytes  0 %   Abs Immature Granulocytes 0.05 0.00 - 0.07 K/uL    Comment: Performed at [facility], [place] [address] [place], [place] [address]  Comprehensive metabolic panel     Status: Abnormal   Collection Time: 05/01/22  9:07 PM  Result Value Ref Range   Sodium 129 (L) 135 - 145 mmol/L   Potassium 3.9 3.5 - 5.1 mmol/L    Comment: HEMOLYSIS AT THIS LEVEL MAY AFFECT RESULT   Chloride 93 (L) 98 - 111 mmol/L   CO2 25 22 - 32 mmol/L   Glucose, Bld 237 (H) 70 - 99 mg/dL    Comment: Glucose reference range applies only to samples taken after fasting for at least 8 hours.   BUN 12 8 - 23 mg/dL   Creatinine, Ser 0.77 0.44 - 1.00 mg/dL   Calcium 9.3 8.9 - 10.3 mg/dL   Total Protein 5.9 (L) 6.5 - 8.1 g/dL   Albumin 2.0 (L) 3.5 - 5.0 g/dL   AST 50 (H) 15 - 41 U/L    Comment: HEMOLYSIS AT THIS LEVEL MAY AFFECT RESULT   ALT 48 (H) 0 - 44 U/L    Comment: HEMOLYSIS AT THIS LEVEL MAY AFFECT RESULT   Alkaline Phosphatase 78 38 - 126 U/L   Total Bilirubin 1.0 0.3 - 1.2 mg/dL    Comment: HEMOLYSIS AT THIS LEVEL MAY AFFECT RESULT   GFR, Estimated >60 >60 mL/min    Comment: (NOTE) Calculated using the CKD-EPI Creatinine Equation (2021)    Anion gap 11 5 - 15    Comment: Performed at [facility], [place] [address] [place], [place] [address]  Troponin I (High Sensitivity)     Status: None   Collection Time: 05/01/22  9:07 PM  Result Value Ref Range   Troponin I (High Sensitivity) 10 <18 ng/L    Comment: (NOTE) Elevated high sensitivity  troponin I (hsTnI) values and significant  changes across serial measurements may suggest ACS but many other  chronic and acute conditions are known to elevate hsTnI results.  Refer to the "Links" section for chest pain algorithms and additional  guidance. Performed at [facility], [place] [address], [place], [place] [address]   Resp panel by RT-PCR (RSV, Flu A&B, Covid) Anterior Nasal Swab     Status: Abnormal   Collection Time: 05/01/22  9:07 PM   Specimen: Anterior Nasal Swab  Result Value Ref Range   SARS Coronavirus 2 by RT PCR POSITIVE (A) NEGATIVE    Comment: (NOTE) SARS-CoV-2 target nucleic acids are DETECTED.  The SARS-CoV-2 RNA is generally detectable in upper respiratory specimens during the acute phase of infection. Positive results are indicative of the presence of the identified virus, but do not rule out bacterial infection or co-infection with other pathogens not detected by the test. Clinical correlation with patient history and other diagnostic information is necessary to determine patient infection status. The expected result is Negative.  Fact Sheet for Patients: EntrepreneurPulse.com.au  Fact Sheet for Healthcare Providers: IncredibleEmployment.be  This test is not yet approved or cleared by the [place] FDA and  has been authorized for detection and/or diagnosis of SARS-CoV-2 by FDA under an Emergency Use Authorization (EUA).  This EUA will remain in effect (meaning this test can be used) for the duration of  the COVID-19 declaration under Section 564(b)(1) of the A ct, 21 U.S.C. section 360bbb-3(b)(1), unless the authorization is terminated or revoked sooner.     Influenza A by  PCR NEGATIVE NEGATIVE   Influenza B by PCR NEGATIVE NEGATIVE    Comment: (NOTE) The Xpert Xpress SARS-CoV-2/FLU/RSV plus assay is intended as an aid in the diagnosis of influenza from Nasopharyngeal swab specimens and should not be  used as a sole basis for treatment. Nasal washings and aspirates are unacceptable for Xpert Xpress SARS-CoV-2/FLU/RSV testing.  Fact Sheet for Patients: EntrepreneurPulse.com.au  Fact Sheet for Healthcare Providers: IncredibleEmployment.be  This test is not yet approved or cleared by the [place] FDA and has been authorized for detection and/or diagnosis of SARS-CoV-2 by FDA under an Emergency Use Authorization (EUA). This EUA will remain in effect (meaning this test can be used) for the duration of the COVID-19 declaration under Section 564(b)(1) of the Act, 21 U.S.C. section 360bbb-3(b)(1), unless the authorization is terminated or revoked.     Resp Syncytial Virus by PCR NEGATIVE NEGATIVE    Comment: (NOTE) Fact Sheet for Patients: EntrepreneurPulse.com.au  Fact Sheet for Healthcare Providers: IncredibleEmployment.be  This test is not yet approved or cleared by the [place] FDA and has been authorized for detection and/or diagnosis of SARS-CoV-2 by FDA under an Emergency Use Authorization (EUA). This EUA will remain in effect (meaning this test can be used) for the duration of the COVID-19 declaration under Section 564(b)(1) of the Act, 21 U.S.C. section 360bbb-3(b)(1), unless the authorization is terminated or revoked.  Performed at [facility], [place] [address], [place], [place] [address]   Lactic acid, plasma     Status: Abnormal   Collection Time: 05/01/22 10:17 PM  Result Value Ref Range   Lactic Acid, Venous 3.2 (HH) 0.5 - 1.9 mmol/L    Comment: CRITICAL RESULT CALLED TO, READ BACK BY AND VERIFIED WITH J.NOVIELLO,RN. 0003 05/02/22. LPAIT Performed at [facility], [place] [address], [place], [place] [address]   Troponin I (High Sensitivity)     Status: None   Collection Time: 05/01/22 10:41 PM  Result Value Ref Range   Troponin I (High Sensitivity) 10 <18 ng/L    Comment:  (NOTE) Elevated high sensitivity troponin I (hsTnI) values and significant  changes across serial measurements may suggest ACS but many other  chronic and acute conditions are known to elevate hsTnI results.  Refer to the "Links" section for chest pain algorithms and additional  guidance. Performed at [facility], [place] [address] [place], [place] [address]   Lactic acid, plasma     Status: None   Collection Time: 05/02/22 12:25 AM  Result Value Ref Range   Lactic Acid, Venous 1.3 0.5 - 1.9 mmol/L    Comment: Performed at [facility] [address] [place], [place] [address]  Culture, blood (Routine X 2) w Reflex to ID Panel     Status: None (Preliminary result)   Collection Time: 05/02/22 12:25 AM   Specimen: BLOOD  Result Value Ref Range   Specimen Description BLOOD RIGHT ANTECUBITAL    Special Requests      BOTTLES DRAWN AEROBIC AND ANAEROBIC Blood Culture adequate volume   Culture      NO GROWTH < 12 HOURS Performed at [facility], [place] [address] [place], [place] [address]    Report Status PENDING   Urinalysis, Routine w reflex microscopic     Status: Abnormal   Collection Time: 05/02/22  3:12 AM  Result Value Ref Range   Color, Urine YELLOW YELLOW   APPearance CLOUDY (A) CLEAR   Specific Gravity, Urine 1.015 1.005 -  1.030   pH 6.0 5.0 - 8.0   Glucose, UA NEGATIVE NEGATIVE mg/dL   Hgb urine dipstick MODERATE (A) NEGATIVE   Bilirubin Urine NEGATIVE NEGATIVE   Ketones, ur NEGATIVE NEGATIVE mg/dL   Protein, ur NEGATIVE NEGATIVE mg/dL   Nitrite NEGATIVE NEGATIVE   Leukocytes,Ua LARGE (A) NEGATIVE   RBC / HPF >50 (H) 0 - 5 RBC/hpf   WBC, UA >50 (H) 0 - 5 WBC/hpf   Bacteria, UA FEW (A) NONE SEEN   Squamous Epithelial / HPF 6-10 0 - 5 /HPF   WBC Clumps PRESENT    Mucus PRESENT    Budding Yeast PRESENT     Comment: Performed at [facility] [address], [place], [place] [address]  D-dimer, quantitative     Status: Abnormal   Collection  Time: 05/02/22  5:52 AM  Result Value Ref Range   D-Dimer, Quant 1.26 (H) 0.00 - 0.50 ug/mL-FEU    Comment: (NOTE) At the manufacturer cut-off value of 0.5 g/mL FEU, this assay has a negative predictive value of 95-100%.This assay is intended for use in conjunction with a clinical pretest probability (PTP) assessment model to exclude pulmonary embolism (PE) and deep venous thrombosis (DVT) in outpatients suspected of PE or DVT. Results should be correlated with clinical presentation. Performed at [facility], [place] [address] [place], [place] [address]   Lactate dehydrogenase     Status: None   Collection Time: 05/02/22  5:52 AM  Result Value Ref Range   LDH 127 98 - 192 U/L    Comment: Performed at [facility], [place] [address] [place], [place] [address]  C-reactive protein     Status: Abnormal   Collection Time: 05/02/22  5:52 AM  Result Value Ref Range   CRP 13.8 (H) <1.0 mg/dL    Comment: Performed at [facility] [address], [place], [place] [address]  Comprehensive metabolic panel     Status: Abnormal   Collection Time: 05/02/22  5:52 AM  Result Value Ref Range   Sodium 135 135 - 145 mmol/L   Potassium 4.0 3.5 - 5.1 mmol/L   Chloride 100 98 - 111 mmol/L   CO2 28 22 - 32 mmol/L   Glucose, Bld 245 (H) 70 - 99 mg/dL    Comment: Glucose reference range applies only to samples taken after fasting for at least 8 hours.   BUN 10 8 - 23 mg/dL   Creatinine, Ser 0.66 0.44 - 1.00 mg/dL   Calcium 9.3 8.9 - 10.3 mg/dL   Total Protein 5.6 (L) 6.5 - 8.1 g/dL   Albumin 1.8 (L) 3.5 - 5.0 g/dL   AST 33 15 - 41 U/L   ALT 41 0 - 44 U/L   Alkaline Phosphatase 70 38 - 126 U/L   Total Bilirubin 0.3 0.3 - 1.2 mg/dL   GFR, Estimated >60 >60 mL/min    Comment: (NOTE) Calculated using the CKD-EPI Creatinine Equation (2021)    Anion gap 7 5 - 15    Comment: Performed at [facility], [place] [address] [place], [place] [address]  Magnesium     Status: Abnormal    Collection Time: 05/02/22  5:52 AM  Result Value Ref Range   Magnesium 1.6 (L) 1.7 - 2.4 mg/dL    Comment: Performed at [facility] [address], [place], [place] [address]  CBC     Status: Abnormal   Collection Time: 05/02/22  6:15  AM  Result Value Ref Range   WBC 6.0 4.0 - 10.5 K/uL   RBC 3.91 3.87 - 5.11 MIL/uL   Hemoglobin 10.5 (L) 12.0 - 15.0 g/dL   HCT 34.1 (L) 36.0 - 46.0 %   MCV 87.2 80.0 - 100.0 fL   MCH 26.9 26.0 - 34.0 pg   MCHC 30.8 30.0 - 36.0 g/dL   RDW 14.7 11.5 - 15.5 %   Platelets 243 150 - 400 K/uL   nRBC 0.0 0.0 - 0.2 %    Comment: Performed at [facility], [place] [address], [place], [place] [address]  CBG monitoring, ED     Status: Abnormal   Collection Time: 05/02/22  6:25 AM  Result Value Ref Range   Glucose-Capillary 231 (H) 70 - 99 mg/dL    Comment: Glucose reference range applies only to samples taken after fasting for at least 8 hours.  CBG monitoring, ED     Status: Abnormal   Collection Time: 05/02/22  8:45 AM  Result Value Ref Range   Glucose-Capillary 242 (H) 70 - 99 mg/dL    Comment: Glucose reference range applies only to samples taken after fasting for at least 8 hours.  CBG monitoring, ED     Status: Abnormal   Collection Time: 05/02/22 11:48 AM  Result Value Ref Range   Glucose-Capillary 197 (H) 70 - 99 mg/dL    Comment: Glucose reference range applies only to samples taken after fasting for at least 8 hours.  Glucose, capillary     Status: Abnormal   Collection Time: 05/02/22  4:43 PM  Result Value Ref Range   Glucose-Capillary 161 (H) 70 - 99 mg/dL    Comment: Glucose reference range applies only to samples taken after fasting for at least 8 hours.    CT ABDOMEN PELVIS W CONTRAST  Result Date: 05/02/2022 CLINICAL DATA:  Study identified as missing a report at 4:02 am on 05/02/2022. 82 year old female with abdomen and flank pain. Increasing shortness of breath. EXAM: CT ABDOMEN AND PELVIS WITH CONTRAST TECHNIQUE:  Multidetector CT imaging of the abdomen and pelvis was performed using the standard protocol following bolus administration of intravenous contrast. RADIATION DOSE REDUCTION: This exam was performed according to the departmental dose-optimization program which includes automated exposure control, adjustment of the mA and/or kV according to patient size and/or use of iterative reconstruction technique. CONTRAST:  69m OMNIPAQUE IOHEXOL 350 MG/ML SOLN COMPARISON:  CTA chest from the same time reported separately. Prior CT Abdomen and Pelvis 01/29/2022. FINDINGS: Lower chest: Chest CTA reported separately. Hepatobiliary: Mild motion artifact. Negative liver and gallbladder. Pancreas: Atrophied, otherwise negative. Spleen: Stable and negative. Adrenals/Urinary Tract: Stable adrenal glands. Kidneys appears stable since October with symmetric renal enhancement, and symmetric contrast excretion on delayed images. No nephrolithiasis or suspicious renal lesion. Decompressed ureters. Moderately distended urinary bladder today with mild chronic bladder wall thickening. Estimated bladder volume 520 mL. Stomach/Bowel: Stool ball in the rectum, new since October and up to 12 cm in length (series 9, image 113). Upstream redundant but decompressed sigmoid colon throughout the pelvis. Decompressed descending colon with diverticulosis. Mild redundant transverse colon with retained stool. Right colon retained stool and diverticulosis. Diminutive, normal retrocecal appendix series 13, image 58. No large bowel inflammation. No dilated small bowel. Decompressed stomach and duodenum. No free air or free fluid. Vascular/Lymphatic: Aortoiliac calcified atherosclerosis. Normal caliber abdominal aorta. Major arterial structures remain patent. Portal venous system is patent. No lymphadenopathy identified. Reproductive: Surgically absent uterus. Diminutive or absent  ovaries. Other: No pelvic free fluid. Presacral stranding has decreased since  October but not resolved. Musculoskeletal: Advanced lumbar spine degeneration. Right hip arthroplasty. No acute osseous abnormality identified. IMPRESSION: 1. Stool ball in the rectum up to 12 cm in length, suspicious for Fecal Impaction. 2. Distended urinary bladder (520 mL) might be urinary retention secondary to #1. 3. No other acute or inflammatory process identified in the abdomen or pelvis. Chest CTA at the same time reported separately. Advanced lumbar spine degeneration. Aortic Atherosclerosis (ICD10-I70.0). Electronically Signed   By: Genevie Ann M.D.   On: 05/02/2022 04:09   CT Angio Chest PE W and/or Wo Contrast  Result Date: 05/02/2022 CLINICAL DATA:  Worsening shortness of breath. EXAM: CT ANGIOGRAPHY CHEST WITH CONTRAST TECHNIQUE: Multidetector CT imaging of the chest was performed using the standard protocol during bolus administration of intravenous contrast. Multiplanar CT image reconstructions and MIPs were obtained to evaluate the vascular anatomy. RADIATION DOSE REDUCTION: This exam was performed according to the departmental dose-optimization program which includes automated exposure control, adjustment of the mA and/or kV according to patient size and/or use of iterative reconstruction technique. CONTRAST:  91m OMNIPAQUE IOHEXOL 350 MG/ML SOLN COMPARISON:  None Available. FINDINGS: Cardiovascular: There is marked severity calcification of the aortic arch, without evidence of aortic aneurysm. Satisfactory opacification of the pulmonary arteries to the segmental level. No evidence of pulmonary embolism. Normal heart size with marked severity coronary artery calcification. No pericardial effusion. Mediastinum/Nodes: No enlarged mediastinal, hilar, or axillary lymph nodes. Thyroid gland, trachea, and esophagus demonstrate no significant findings. Lungs/Pleura: Mild to moderate severity areas of scarring and/or atelectasis are seen within the bilateral apices mild posterior bibasilar atelectasis is  also noted. There is no evidence of an acute infiltrate, pleural effusion or pneumothorax. Upper Abdomen: No acute abnormality. Musculoskeletal: Multilevel degenerative changes are seen throughout the thoracic spine. Review of the MIP images confirms the above findings. IMPRESSION: 1. No evidence of pulmonary embolism or other acute intrathoracic process. 2. Mild to moderate severity biapical and bibasilar scarring and/or atelectasis. 3. Marked severity coronary artery calcification. 4. Aortic atherosclerosis. Aortic Atherosclerosis (ICD10-I70.0). Electronically Signed   By: TVirgina NorfolkM.D.   On: 05/02/2022 00:46   CT Head Wo Contrast  Result Date: 05/02/2022 CLINICAL DATA:  Altered mental status, nontraumatic (Ped 0-17y) EXAM: CT HEAD WITHOUT CONTRAST TECHNIQUE: Contiguous axial images were obtained from the base of the skull through the vertex without intravenous contrast. RADIATION DOSE REDUCTION: This exam was performed according to the departmental dose-optimization program which includes automated exposure control, adjustment of the mA and/or kV according to patient size and/or use of iterative reconstruction technique. 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 Remote bilateral cerebellar infarcts and right occipital cortical infarcts are again noted. Remote infarct within the right pons partially obscured by streak artifact but grossly stable. No acute intracranial hemorrhage or infarct. Ventricular size is stable. Vascular: Vascular stent again noted within the basilar artery. No asymmetric hyperdense vasculature at the skull base. Skull: Normal. Negative for fracture or focal lesion. Sinuses/Orbits: No acute finding. Other: Mastoid air cells and  middle ear cavities are clear. IMPRESSION: 1. 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 2. 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 Remote right pontine infarct. 3. No acute intracranial hemorrhage or infarct. Electronically Signed   By: Fidela Salisbury M.D.   On: 05/02/2022 00:31   DG Foot Complete Left  Result Date: 05/01/2022 CLINICAL DATA:  Left foot wound. EXAM: LEFT FOOT - COMPLETE 3+ VIEW COMPARISON:  None Available. FINDINGS: There is no evidence of fracture or dislocation. Lytic/sclerotic appearance of the first metatarsal head with hallux valgus deformity. There is mild cortical erosion and periosteal reaction about the posteroinferior aspect of the calcaneus. There is arthropathy of the multiple tarsometatarsal joints. No pericardial effusion or periosteal reaction. Soft tissue wound about the posterior aspect of the calcaneus. Soft tissue edema about the dorsum of the foot. IMPRESSION: 1. Lytic/sclerotic appearance of the first metatarsal head with hallux valgus deformity, likely a chronic processa. 2. Soft tissue wound about the posterior aspect of the calcaneus. 3. There is mild cortical erosion and periosteal reaction about the posteroinferior aspect of the calcaneus, with adjacent soft tissue wound suspicious for osteomyelitis, further evaluation with MR examination is suggested. Electronically Signed   By: Keane Police D.O.   On: 05/01/2022 22:31   DG Chest Portable 1 View  Result Date: 05/01/2022 CLINICAL DATA:  Shortness of breath EXAM: PORTABLE CHEST 1 VIEW COMPARISON:  03/01/2022 FINDINGS: Stable cardiomediastinal silhouette. Aortic atherosclerotic calcification. No focal consolidation, pleural effusion, or pneumothorax. No acute osseous abnormality. IMPRESSION: No  active disease. Electronically Signed   By: Placido Sou M.D.   On: 05/01/2022 21:30    Review of Systems  Unable to perform ROS: Mental status change   Blood pressure (!) 158/88, pulse (!) 116, temperature 98.8 F (37.1 C), temperature source Oral, resp. rate 19, SpO2 100 %. Physical Exam Constitutional:      Comments: Patient is alert but confused.  Answers most questions inappropriately.  She does move all 4 extremities.  She has moderate dysmetria with the upper extremities and lower extremities.  She is nonambulatory at this time.  Neurological:     Mental Status: She is alert.     Assessment/Plan: Large frontal meningioma with vasogenic edema in a patient who is a poor surgical risk and has had continued deterioration in her functional status.  I believe the Decadron can be used to treat her symptoms however she is not a candidate for surgical intervention given her multiple comorbidities and her poor functional status.  Decadron can be tapered as her condition allows.  Blanchie Dessert Isa Hitz 05/02/2022, 5:31 PM

## 2022-05-02 NOTE — Progress Notes (Signed)
Progress Note   Patient: Cassandra Dodson LNL:[id number] DOB: 01-04-1941 DOA: 05/01/2022     0 DOS: the patient was seen and examined on 05/02/2022   Brief hospital course: 82 y.o. 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 Pt found to be pos for covid. UA suggestive of pyuria and L foot with concerns of heel osteomyelitis.  Assessment and Plan: Acute metabolic encephalopathy, suspect multifactorial secondary to sepsis, COVID-19 viral infection. Treat underlying conditions Reorient as needed Delirium precautions Aspiration precautions Fall precautions Too somnolent to take p.o. n.p.o. until more alert.   COVID-19 viral infection, POA COVID-19 screening test positive on 05/01/2022. No significant COVID-19 pneumonitis on CT scan. Started on IV remdesivir, vitamin C, zinc Antitussives as needed CRP elevated at 13.8 Bronchodilators Antitussives as needed Follow inflammatory markers   Acute on chronic hypoxic respiratory failure secondary to COVID-19 viral infection At baseline on 2 L nasal cannula Presented on 4 L [place], down to [facility] this afternoon Wean o2 as tolerated Bronchodilators every 6 hours.   Known meningioma CT head without contrast done on 05/02/2022 showed minimally enlarged 5.4 cm planum meningioma with extensive vasogenic edema and slightly progressive 18 mm left-to-right midline shift. Started on IV Decadron per neurosurgery's recommendation. Seen by Dr. Ellene Route this afternoon. Per Neurosurgery, pt is not considered surgical candidate. Recs to  continue steroids per above   Left heel pressure wound/osteomyelitis, POA Concern for possible osteomyelitis, seen on left foot x-ray. Started on IV vancomycin, IV Flagyl, and cefepime in the ED, continue. Follow blood cultures x 2 peripherally. Consulted Ortho, appreciate recs.    History of saddle pulmonary embolism Resume home Eliquis   Hyperlipidemia Resume home regimen.   Chronic constipation Bowel regimen   Type 2 diabetes with hyperglycemia Insulin sliding scale every 4 hours while NPO.  UTI with sepsis present on admission UA suggestive of UTI WBC elevated at 11.2 with encephalopathy Broad spec abx started at time of admit, would cont for now -Will order urine culture -Of note, pt last noted to have pan-sensitive acinetobacter and ecoli in 11/23 urine and ESBL klebsiella in 10/23 urine      Subjective: Unable to assess given mentation  Physical Exam: Vitals:   05/02/22 1109 05/02/22 1130 05/02/22 1430 05/02/22 1624  BP:  (!) 157/143 (!) 124/93 (!) 158/88  Pulse:  99 (!) 103 (!) 116  Resp:  '17 15 19  '$ Temp: 98.7 F (37.1 C)   98.8 F (37.1 C)  TempSrc: Axillary   Oral  SpO2:  100% 100%    General exam: Asleep, laying in bed, in nad, mucus membranes appear dry Respiratory system: Normal respiratory effort, no wheezing Cardiovascular system: regular rate, s1, s2 Gastrointestinal system: Soft, nondistended, positive BS Central nervous system: CN2-12 grossly intact, strength intact Extremities: Perfused, no clubbing Skin: Normal skin turgor, no notable skin lesions seen Psychiatry: unable to assess given mentation   Data Reviewed:  Labs reviewed: Na 135, K 4.0, Cr 0.66, CRP 13.8, WBC 6.0, Hgb 10.5  Family Communication: Pt in room, family not at bedside  Disposition: Status is: Inpatient Remains inpatient appropriate because: Severity of illness  Planned Discharge Destination: Skilled  nursing facility    Author: Marylu Lund, MD 05/02/2022 6:38  PM  For on call review [email or web address].

## 2022-05-02 NOTE — ED Provider Notes (Signed)
Patient signed out to me by Dr. Billy Fischer.  Patient with shortness of breath and altered mental status.  This appears to be multifactorial.  Shortness of breath likely secondary to newly diagnosed COVID infection.  No significant pneumonitis, however.  She is improved on nasal cannula oxygen.  She is normally on 2 L, currently on 4 L.  CT head shows slightly enlarged area of meningioma with significant vasogenic edema.  Discussed with neurosurgery.  Recommend Decadron 4 mg IV every 6 hours.  Neurosurgery will follow.  Mental status changes are nonfocal.  She is alert but does not answer questions appropriately.  This is likely multifactorial as well.  It may be secondary to the vasogenic edema seen on CT head, but also could be secondary to infection.  She is positive for COVID and also has extensive decubiti with concern of osteomyelitis of her foot.  Empiric antibiotics have been administered, will admit to hospitalist service.   Orpah Greek, MD 05/02/22 [id number]

## 2022-05-02 NOTE — ED Notes (Signed)
ED TO INPATIENT HANDOFF REPORT  ED Nurse Name and Phone #:  Heath Lark [phone]  S Name/Age/Gender Cassandra Dodson 82 y.o. female Room/Bed: 012C/012C  Code Status   Code Status: Full Code  Home/SNF/Other Skilled nursing facility Patient oriented to: self Is this baseline? No   Triage Complete: Triage complete  Chief Complaint AMS (altered mental status) [R41.82]  Triage Note Pt BIB EMS. Per EMS, pt c/o increasing SOB today. Upon EMS arrival, pt O2 sats at 89% on 4L [place] (pt normally on 4L [place]). EMS states pt has multiple sores on feet and bottom. Pt alert at this time but not responding to questions.    Allergies Allergies  Allergen Reactions   Anesthetics, Amide Nausea And Vomiting    Pt is intolerant to general anesthesia. Pt will throw up and has thrown up during procedure.    Ether Nausea And Vomiting    Level of Care/Admitting Diagnosis ED Disposition     ED Disposition  Admit   Condition  --   [place]: [facility] [[id number]]  Level of Care: Progressive [102]  Admit to Progressive based on following criteria: MULTISYSTEM THREATS such as stable sepsis, metabolic/electrolyte imbalance with or without encephalopathy that is responding to early treatment.  May admit patient to [facility] or [facility] if equivalent level of care is available:: Yes  Covid Evaluation: Asymptomatic - no recent exposure (last 10 days) testing not required  Diagnosis: AMS (altered mental status) [[id number]]  Admitting Physician: Kayleen Memos [[id number]]  Attending Physician: Kayleen Memos [[id number]]  Certification:: I certify this patient will need inpatient services for at least 2 midnights          B Medical/Surgery History Past Medical History:  Diagnosis Date   Arthritis    CKD (chronic kidney disease), stage III ([place])    patient denies   Depression    Diabetes mellitus without complication ([place])    Difficult intravenous access     Endometrial cancer ([place])    GERD (gastroesophageal reflux disease)    Headache    History of radiation therapy 11/04/2019-12/01/2019   Endometrial HDR; Dr. Gery Pray   Hypertension    Hypothyroidism    Neuromuscular disorder ([place])    neuropathy in feet   Osteoarthritis    PMB (postmenopausal bleeding)    PONV (postoperative nausea and vomiting)    severe nausea and vomiting after knee replacement 06-2014, did ok with 2018 knee replacement   Urinary frequency    Wears glasses    Past Surgical History:  Procedure Laterality Date   BREAST SURGERY     cyst removed   CESAREAN SECTION     DILATION AND CURETTAGE OF UTERUS N/A 06/24/2019   Procedure: DILATATION AND CURETTAGE;  Surgeon: Lafonda Mosses, MD;  Location: [place] [facility];  Service: Gynecology;  Laterality: N/A;   FRACTURE SURGERY     right knee   INTRAUTERINE DEVICE (IUD) INSERTION N/A 06/24/2019   Procedure: INTRAUTERINE DEVICE (IUD) INSERTION MIRENA;  Surgeon: Lafonda Mosses, MD;  Location: [facility];  Service: Gynecology;  Laterality: N/A;   IR ANGIO INTRA EXTRACRAN SEL COM CAROTID INNOMINATE UNI R MOD SED  11/05/2021   IR ANGIO VERTEBRAL SEL VERTEBRAL UNI R MOD SED  11/05/2021   IR ANGIOGRAM PULMONARY BILATERAL SELECTIVE  01/28/2022   IR ANGIOGRAM SELECTIVE EACH ADDITIONAL VESSEL  01/28/2022   IR ANGIOGRAM SELECTIVE EACH ADDITIONAL VESSEL  01/28/2022   IR CT HEAD  LTD  11/01/2021   IR INTRA CRAN STENT  11/01/2021   IR RADIOLOGIST EVAL & MGMT  12/19/2021   IR THROMBECT PRIM MECH INIT (INCLU) MOD SED  01/28/2022   IR THROMBECT PRIM MECH INIT (INCLU) MOD SED  01/28/2022   IR US GUIDE VASC ACCESS RIGHT  11/01/2021   IR US GUIDE VASC ACCESS RIGHT  01/28/2022   IRRIGATION AND DEBRIDEMENT SEBACEOUS CYST     JOINT REPLACEMENT Right    right   LYMPH NODE DISSECTION N/A 09/14/2019   Procedure: LYMPH NODE DISSECTION;  Surgeon: Lafonda Mosses, MD;  Location: [facility];  Service: Gynecology;  Laterality:  N/A;   RADIOLOGY WITH ANESTHESIA N/A 11/01/2021   Procedure: ANGIOPLASTY/STENT;  Surgeon: Luanne Bras, MD;  Location: [facility];  Service: Radiology;  Laterality: N/A;   RADIOLOGY WITH ANESTHESIA N/A 01/28/2022   Procedure: IR WITH ANESTHESIA;  Surgeon: Radiologist, Medication, MD;  Location: [facility];  Service: Radiology;  Laterality: N/A;   ROBOTIC ASSISTED TOTAL HYSTERECTOMY WITH BILATERAL SALPINGO OOPHERECTOMY Bilateral 09/14/2019   Procedure: XI ROBOTIC ASSISTED TOTAL HYSTERECTOMY WITH BILATERAL SALPINGO OOPHORECTOMY;  Surgeon: Lafonda Mosses, MD;  Location: [facility];  Service: Gynecology;  Laterality: Bilateral;   SENTINEL NODE BIOPSY N/A 09/14/2019   Procedure: SENTINEL NODE BIOPSY;  Surgeon: Lafonda Mosses, MD;  Location[facility];  Service: Gynecology;  Laterality: N/A;   teeth extration     TONSILLECTOMY     TOTAL HIP ARTHROPLASTY Right 02/23/2015   Procedure: RIGHT TOTAL HIP ARTHROPLASTY ANTERIOR APPROACH;  Surgeon: Leandrew Koyanagi, MD;  Location: [facility];  Service: Orthopedics;  Laterality: Right;   TOTAL KNEE ARTHROPLASTY Left 06/27/2016   Procedure: LEFT TOTAL KNEE ARTHROPLASTY;  Surgeon: Leandrew Koyanagi, MD;  Location: [facility];  Service: Orthopedics;  Laterality: Left;     A IV Location/Drains/Wounds Patient Lines/Drains/Airways Status     Active Line/Drains/Airways     Name Placement date Placement time Site Days   Peripheral IV 05/02/22 20 G Right Antecubital 05/02/22  0000  Antecubital  less than 1   Pressure Injury 03/01/22 Buttocks Mid Stage 2 -  Partial thickness loss of dermis presenting as a shallow open injury with a red, pink wound bed without slough. 03/01/22  2030  -- 62   Pressure Injury 03/01/22 Heel Left Deep Tissue Pressure Injury - Purple or maroon localized area of discolored intact skin or blood-filled blister due to damage of underlying soft tissue from pressure and/or shear. 03/01/22  2030  -- 62            Intake/Output Last 24 hours  Intake/Output  Summary (Last 24 hours) at 05/02/2022 1442 Last data filed at 05/02/2022 1436 Gross per 24 hour  Intake 2562.52 ml  Output --  Net 2562.52 ml    Labs/Imaging Results for orders placed or performed during the hospital encounter of 05/01/22 (from the past 48 hour(s))  Brain natriuretic peptide     Status: Abnormal   Collection Time: 05/01/22  8:42 PM  Result Value Ref Range   B Natriuretic Peptide 107.2 (H) 0.0 - 100.0 pg/mL    Comment: Performed at [facility], [address] [place], [place] [address]  CBC with Differential     Status: Abnormal   Collection Time: 05/01/22  9:07 PM  Result Value Ref Range   WBC 11.2 (H) 4.0 - 10.5 K/uL   RBC 3.83 (L) 3.87 - 5.11 MIL/uL   Hemoglobin 10.3 (L) 12.0 - 15.0 g/dL  HCT 33.5 (L) 36.0 - 46.0 %   MCV 87.5 80.0 - 100.0 fL   MCH 26.9 26.0 - 34.0 pg   MCHC 30.7 30.0 - 36.0 g/dL   RDW 14.9 11.5 - 15.5 %   Platelets 325 150 - 400 K/uL   nRBC 0.0 0.0 - 0.2 %   Neutrophils Relative % 81 %   Neutro Abs 9.0 (H) 1.7 - 7.7 K/uL   Lymphocytes Relative 10 %   Lymphs Abs 1.1 0.7 - 4.0 K/uL   Monocytes Relative 8 %   Monocytes Absolute 0.9 0.1 - 1.0 K/uL   Eosinophils Relative 1 %   Eosinophils Absolute 0.1 0.0 - 0.5 K/uL   Basophils Relative 0 %   Basophils Absolute 0.1 0.0 - 0.1 K/uL   Immature Granulocytes 0 %   Abs Immature Granulocytes 0.05 0.00 - 0.07 K/uL    Comment: Performed at [facility] [address], [place], [place] [address]  Comprehensive metabolic panel     Status: Abnormal   Collection Time: 05/01/22  9:07 PM  Result Value Ref Range   Sodium 129 (L) 135 - 145 mmol/L   Potassium 3.9 3.5 - 5.1 mmol/L    Comment: HEMOLYSIS AT THIS LEVEL MAY AFFECT RESULT   Chloride 93 (L) 98 - 111 mmol/L   CO2 25 22 - 32 mmol/L   Glucose, Bld 237 (H) 70 - 99 mg/dL    Comment: Glucose reference range applies only to samples taken after fasting for at least 8 hours.   BUN 12 8 - 23 mg/dL   Creatinine, Ser 0.77 0.44 - 1.00 mg/dL    Calcium 9.3 8.9 - 10.3 mg/dL   Total Protein 5.9 (L) 6.5 - 8.1 g/dL   Albumin 2.0 (L) 3.5 - 5.0 g/dL   AST 50 (H) 15 - 41 U/L    Comment: HEMOLYSIS AT THIS LEVEL MAY AFFECT RESULT   ALT 48 (H) 0 - 44 U/L    Comment: HEMOLYSIS AT THIS LEVEL MAY AFFECT RESULT   Alkaline Phosphatase 78 38 - 126 U/L   Total Bilirubin 1.0 0.3 - 1.2 mg/dL    Comment: HEMOLYSIS AT THIS LEVEL MAY AFFECT RESULT   GFR, Estimated >60 >60 mL/min    Comment: (NOTE) Calculated using the CKD-EPI Creatinine Equation (2021)    Anion gap 11 5 - 15    Comment: Performed at [facility], [place] [address] [place], [place] [address]  Troponin I (High Sensitivity)     Status: None   Collection Time: 05/01/22  9:07 PM  Result Value Ref Range   Troponin I (High Sensitivity) 10 <18 ng/L    Comment: (NOTE) Elevated high sensitivity troponin I (hsTnI) values and significant  changes across serial measurements may suggest ACS but many other  chronic and acute conditions are known to elevate hsTnI results.  Refer to the "Links" section for chest pain algorithms and additional  guidance. Performed at [facility], [place] [address], [place],  [address]   Resp panel by RT-PCR (RSV, Flu A&B, Covid) Anterior Nasal Swab     Status: Abnormal   Collection Time: 05/01/22  9:07 PM   Specimen: Anterior Nasal Swab  Result Value Ref Range   SARS Coronavirus 2 by RT PCR POSITIVE (A) NEGATIVE    Comment: (NOTE) SARS-CoV-2 target nucleic acids are DETECTED.  The SARS-CoV-2 RNA is generally detectable in upper respiratory specimens during the acute phase of infection. Positive results are indicative of the  presence of the identified virus, but do not rule out bacterial infection or co-infection with other pathogens not detected by the test. Clinical correlation with patient history and other diagnostic information is necessary to determine patient infection status. The expected result is Negative.  Fact Sheet for  Patients: EntrepreneurPulse.com.au  Fact Sheet for Healthcare Providers: IncredibleEmployment.be  This test is not yet approved or cleared by the [place] FDA and  has been authorized for detection and/or diagnosis of SARS-CoV-2 by FDA under an Emergency Use Authorization (EUA).  This EUA will remain in effect (meaning this test can be used) for the duration of  the COVID-19 declaration under Section 564(b)(1) of the A ct, 21 U.S.C. section 360bbb-3(b)(1), unless the authorization is terminated or revoked sooner.     Influenza A by PCR NEGATIVE NEGATIVE   Influenza B by PCR NEGATIVE NEGATIVE    Comment: (NOTE) The Xpert Xpress SARS-CoV-2/FLU/RSV plus assay is intended as an aid in the diagnosis of influenza from Nasopharyngeal swab specimens and should not be used as a sole basis for treatment. Nasal washings and aspirates are unacceptable for Xpert Xpress SARS-CoV-2/FLU/RSV testing.  Fact Sheet for Patients: EntrepreneurPulse.com.au  Fact Sheet for Healthcare Providers: IncredibleEmployment.be  This test is not yet approved or cleared by the [place] FDA and has been authorized for detection and/or diagnosis of SARS-CoV-2 by FDA under an Emergency Use Authorization (EUA). This EUA will remain in effect (meaning this test can be used) for the duration of the COVID-19 declaration under Section 564(b)(1) of the Act, 21 U.S.C. section 360bbb-3(b)(1), unless the authorization is terminated or revoked.     Resp Syncytial Virus by PCR NEGATIVE NEGATIVE    Comment: (NOTE) Fact Sheet for Patients: EntrepreneurPulse.com.au  Fact Sheet for Healthcare Providers: IncredibleEmployment.be  This test is not yet approved or cleared by the [place] FDA and has been authorized for detection and/or diagnosis of SARS-CoV-2 by FDA under an Emergency Use Authorization  (EUA). This EUA will remain in effect (meaning this test can be used) for the duration of the COVID-19 declaration under Section 564(b)(1) of the Act, 21 U.S.C. section 360bbb-3(b)(1), unless the authorization is terminated or revoked.  Performed at [facility], [place] [address], [place], [place] [address]   Lactic acid, plasma     Status: Abnormal   Collection Time: 05/01/22 10:17 PM  Result Value Ref Range   Lactic Acid, Venous 3.2 (HH) 0.5 - 1.9 mmol/L    Comment: CRITICAL RESULT CALLED TO, READ BACK BY AND VERIFIED WITH J.NOVIELLO,RN. 0003 05/02/22. LPAIT Performed at [facility], [place] [address], [place], [place] [address]   Troponin I (High Sensitivity)     Status: None   Collection Time: 05/01/22 10:41 PM  Result Value Ref Range   Troponin I (High Sensitivity) 10 <18 ng/L    Comment: (NOTE) Elevated high sensitivity troponin I (hsTnI) values and significant  changes across serial measurements may suggest ACS but many other  chronic and acute conditions are known to elevate hsTnI results.  Refer to the "Links" section for chest pain algorithms and additional  guidance. Performed at [facility], [place] [address], [place], [place] [address]   Lactic acid, plasma     Status: None   Collection Time: 05/02/22 12:25 AM  Result Value Ref Range   Lactic Acid, Venous 1.3 0.5 - 1.9 mmol/L    Comment: Performed at [facility] [address] [place], [place] [address]  Culture,  blood (Routine X 2) w Reflex to ID Panel     Status: None (Preliminary result)   Collection Time: 05/02/22 12:25 AM   Specimen: BLOOD  Result Value Ref Range   Specimen Description BLOOD RIGHT ANTECUBITAL    Special Requests      BOTTLES DRAWN AEROBIC AND ANAEROBIC Blood Culture adequate volume   Culture      NO GROWTH < 12 HOURS Performed at [facility] [address] [place], [place] [address]    Report Status PENDING   Urinalysis, Routine w reflex microscopic      Status: Abnormal   Collection Time: 05/02/22  3:12 AM  Result Value Ref Range   Color, Urine YELLOW YELLOW   APPearance CLOUDY (A) CLEAR   Specific Gravity, Urine 1.015 1.005 - 1.030   pH 6.0 5.0 - 8.0   Glucose, UA NEGATIVE NEGATIVE mg/dL   Hgb urine dipstick MODERATE (A) NEGATIVE   Bilirubin Urine NEGATIVE NEGATIVE   Ketones, ur NEGATIVE NEGATIVE mg/dL   Protein, ur NEGATIVE NEGATIVE mg/dL   Nitrite NEGATIVE NEGATIVE   Leukocytes,Ua LARGE (A) NEGATIVE   RBC / HPF >50 (H) 0 - 5 RBC/hpf   WBC, UA >50 (H) 0 - 5 WBC/hpf   Bacteria, UA FEW (A) NONE SEEN   Squamous Epithelial / HPF 6-10 0 - 5 /HPF   WBC Clumps PRESENT    Mucus PRESENT    Budding Yeast PRESENT     Comment: Performed at [facility], [place] [address] [place], [place] [address]  D-dimer, quantitative     Status: Abnormal   Collection Time: 05/02/22  5:52 AM  Result Value Ref Range   D-Dimer, Quant 1.26 (H) 0.00 - 0.50 ug/mL-FEU    Comment: (NOTE) At the manufacturer cut-off value of 0.5 g/mL FEU, this assay has a negative predictive value of 95-100%.This assay is intended for use in conjunction with a clinical pretest probability (PTP) assessment model to exclude pulmonary embolism (PE) and deep venous thrombosis (DVT) in outpatients suspected of PE or DVT. Results should be correlated with clinical presentation. Performed at [facility], [place] [address], [place], [place] [address]   Lactate dehydrogenase     Status: None   Collection Time: 05/02/22  5:52 AM  Result Value Ref Range   LDH 127 98 - 192 U/L    Comment: Performed at [facility], [place] [address], [place], [place] [address]  C-reactive protein     Status: Abnormal   Collection Time: 05/02/22  5:52 AM  Result Value Ref Range   CRP 13.8 (H) <1.0 mg/dL    Comment: Performed at [facility] [address] [place], [place] [address]  Comprehensive metabolic panel     Status: Abnormal   Collection Time: 05/02/22  5:52 AM  Result  Value Ref Range   Sodium 135 135 - 145 mmol/L   Potassium 4.0 3.5 - 5.1 mmol/L   Chloride 100 98 - 111 mmol/L   CO2 28 22 - 32 mmol/L   Glucose, Bld 245 (H) 70 - 99 mg/dL    Comment: Glucose reference range applies only to samples taken after fasting for at least 8 hours.   BUN 10 8 - 23 mg/dL   Creatinine, Ser 0.66 0.44 - 1.00 mg/dL   Calcium 9.3 8.9 - 10.3 mg/dL   Total Protein 5.6 (L) 6.5 - 8.1 g/dL   Albumin 1.8 (L) 3.5 - 5.0 g/dL   AST 33  15 - 41 U/L   ALT 41 0 - 44 U/L   Alkaline Phosphatase 70 38 - 126 U/L   Total Bilirubin 0.3 0.3 - 1.2 mg/dL   GFR, Estimated >60 >60 mL/min    Comment: (NOTE) Calculated using the CKD-EPI Creatinine Equation (2021)    Anion gap 7 5 - 15    Comment: Performed at [facility] [address], [place], [place] [address]  Magnesium     Status: Abnormal   Collection Time: 05/02/22  5:52 AM  Result Value Ref Range   Magnesium 1.6 (L) 1.7 - 2.4 mg/dL    Comment: Performed at [facility] [address], [place], [place] [address]  CBC     Status: Abnormal   Collection Time: 05/02/22  6:15 AM  Result Value Ref Range   WBC 6.0 4.0 - 10.5 K/uL   RBC 3.91 3.87 - 5.11 MIL/uL   Hemoglobin 10.5 (L) 12.0 - 15.0 g/dL   HCT 34.1 (L) 36.0 - 46.0 %   MCV 87.2 80.0 - 100.0 fL   MCH 26.9 26.0 - 34.0 pg   MCHC 30.8 30.0 - 36.0 g/dL   RDW 14.7 11.5 - 15.5 %   Platelets 243 150 - 400 K/uL   nRBC 0.0 0.0 - 0.2 %    Comment: Performed at [facility], [place] [address] [place], [place] [address]  CBG monitoring, ED     Status: Abnormal   Collection Time: 05/02/22  6:25 AM  Result Value Ref Range   Glucose-Capillary 231 (H) 70 - 99 mg/dL    Comment: Glucose reference range applies only to samples taken after fasting for at least 8 hours.  CBG monitoring, ED     Status: Abnormal   Collection Time: 05/02/22  8:45 AM  Result Value Ref Range   Glucose-Capillary 242 (H) 70 - 99 mg/dL    Comment: Glucose reference range applies only to  samples taken after fasting for at least 8 hours.  CBG monitoring, ED     Status: Abnormal   Collection Time: 05/02/22 11:48 AM  Result Value Ref Range   Glucose-Capillary 197 (H) 70 - 99 mg/dL    Comment: Glucose reference range applies only to samples taken after fasting for at least 8 hours.   CT ABDOMEN PELVIS W CONTRAST  Result Date: 05/02/2022 CLINICAL DATA:  Study identified as missing a report at 4:02 am on 05/02/2022. 82 year old female with abdomen and flank pain. Increasing shortness of breath. EXAM: CT ABDOMEN AND PELVIS WITH CONTRAST TECHNIQUE: Multidetector CT imaging of the abdomen and pelvis was performed using the standard protocol following bolus administration of intravenous contrast. RADIATION DOSE REDUCTION: This exam was performed according to the departmental dose-optimization program which includes automated exposure control, adjustment of the mA and/or kV according to patient size and/or use of iterative reconstruction technique. CONTRAST:  36m OMNIPAQUE IOHEXOL 350 MG/ML SOLN COMPARISON:  CTA chest from the same time reported separately. Prior CT Abdomen and Pelvis 01/29/2022. FINDINGS: Lower chest: Chest CTA reported separately. Hepatobiliary: Mild motion artifact. Negative liver and gallbladder. Pancreas: Atrophied, otherwise negative. Spleen: Stable and negative. Adrenals/Urinary Tract: Stable adrenal glands. Kidneys appears stable since October with symmetric renal enhancement, and symmetric contrast excretion on delayed images. No nephrolithiasis or suspicious renal lesion. Decompressed ureters. Moderately distended urinary bladder today with mild chronic bladder wall thickening. Estimated bladder volume 520 mL. Stomach/Bowel: Stool ball in the rectum, new since October and up to 12 cm in  length (series 9, image 113). Upstream redundant but decompressed sigmoid colon throughout the pelvis. Decompressed descending colon with diverticulosis. Mild redundant transverse colon with  retained stool. Right colon retained stool and diverticulosis. Diminutive, normal retrocecal appendix series 13, image 58. No large bowel inflammation. No dilated small bowel. Decompressed stomach and duodenum. No free air or free fluid. Vascular/Lymphatic: Aortoiliac calcified atherosclerosis. Normal caliber abdominal aorta. Major arterial structures remain patent. Portal venous system is patent. No lymphadenopathy identified. Reproductive: Surgically absent uterus. Diminutive or absent ovaries. Other: No pelvic free fluid. Presacral stranding has decreased since October but not resolved. Musculoskeletal: Advanced lumbar spine degeneration. Right hip arthroplasty. No acute osseous abnormality identified. IMPRESSION: 1. Stool ball in the rectum up to 12 cm in length, suspicious for Fecal Impaction. 2. Distended urinary bladder (520 mL) might be urinary retention secondary to #1. 3. No other acute or inflammatory process identified in the abdomen or pelvis. Chest CTA at the same time reported separately. Advanced lumbar spine degeneration. Aortic Atherosclerosis (ICD10-I70.0). Electronically Signed   By: Genevie Ann M.D.   On: 05/02/2022 04:09   CT Angio Chest PE W and/or Wo Contrast  Result Date: 05/02/2022 CLINICAL DATA:  Worsening shortness of breath. EXAM: CT ANGIOGRAPHY CHEST WITH CONTRAST TECHNIQUE: Multidetector CT imaging of the chest was performed using the standard protocol during bolus administration of intravenous contrast. Multiplanar CT image reconstructions and MIPs were obtained to evaluate the vascular anatomy. RADIATION DOSE REDUCTION: This exam was performed according to the departmental dose-optimization program which includes automated exposure control, adjustment of the mA and/or kV according to patient size and/or use of iterative reconstruction technique. CONTRAST:  67m OMNIPAQUE IOHEXOL 350 MG/ML SOLN COMPARISON:  None Available. FINDINGS: Cardiovascular: There is marked severity  calcification of the aortic arch, without evidence of aortic aneurysm. Satisfactory opacification of the pulmonary arteries to the segmental level. No evidence of pulmonary embolism. Normal heart size with marked severity coronary artery calcification. No pericardial effusion. Mediastinum/Nodes: No enlarged mediastinal, hilar, or axillary lymph nodes. Thyroid gland, trachea, and esophagus demonstrate no significant findings. Lungs/Pleura: Mild to moderate severity areas of scarring and/or atelectasis are seen within the bilateral apices mild posterior bibasilar atelectasis is also noted. There is no evidence of an acute infiltrate, pleural effusion or pneumothorax. Upper Abdomen: No acute abnormality. Musculoskeletal: Multilevel degenerative changes are seen throughout the thoracic spine. Review of the MIP images confirms the above findings. IMPRESSION: 1. No evidence of pulmonary embolism or other acute intrathoracic process. 2. Mild to moderate severity biapical and bibasilar scarring and/or atelectasis. 3. Marked severity coronary artery calcification. 4. Aortic atherosclerosis. Aortic Atherosclerosis (ICD10-I70.0). Electronically Signed   By: TVirgina NorfolkM.D.   On: 05/02/2022 00:46   CT Head Wo Contrast  Result Date: 05/02/2022 CLINICAL DATA:  Altered mental status, nontraumatic (Ped 0-17y) EXAM: CT HEAD WITHOUT CONTRAST TECHNIQUE: Contiguous axial images were obtained from the base of the skull through the vertex without intravenous contrast. RADIATION DOSE REDUCTION: This exam was performed according to the departmental dose-optimization program which includes automated exposure control, adjustment of the mA and/or kV according to patient size and/or use of iterative reconstruction technique. 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 Remote bilateral cerebellar infarcts and right occipital cortical infarcts are again noted. Remote infarct within the right pons partially obscured by  streak artifact but grossly stable. No acute intracranial hemorrhage or infarct. Ventricular size is stable. Vascular: Vascular stent again noted within the basilar artery. No asymmetric hyperdense vasculature at the skull base. Skull: Normal. Negative for fracture or focal lesion. Sinuses/Orbits: No acute finding. Other: Mastoid air cells and middle ear cavities are clear. IMPRESSION: 1. 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 2. 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 Remote right pontine infarct. 3. No acute intracranial hemorrhage or infarct. Electronically Signed   By: Fidela Salisbury M.D.   On: 05/02/2022 00:31   DG Foot Complete Left  Result Date: 05/01/2022 CLINICAL DATA:  Left foot wound. EXAM: LEFT FOOT - COMPLETE 3+ VIEW COMPARISON:  None Available. FINDINGS: There is no evidence of fracture or dislocation. Lytic/sclerotic appearance of the first metatarsal head with hallux valgus deformity. There is mild cortical erosion and periosteal reaction about the posteroinferior aspect of the calcaneus. There is arthropathy of the multiple tarsometatarsal joints. No pericardial effusion or periosteal reaction. Soft tissue wound about the posterior aspect of the calcaneus. Soft tissue edema about the dorsum of the foot. IMPRESSION: 1. Lytic/sclerotic appearance of the first metatarsal head with hallux valgus deformity, likely a chronic processa. 2. Soft tissue wound about the posterior aspect of the calcaneus. 3. There is mild cortical erosion and  periosteal reaction about the posteroinferior aspect of the calcaneus, with adjacent soft tissue wound suspicious for osteomyelitis, further evaluation with MR examination is suggested. Electronically Signed   By: Keane Police D.O.   On: 05/01/2022 22:31   DG Chest Portable 1 View  Result Date: 05/01/2022 CLINICAL DATA:  Shortness of breath EXAM: PORTABLE CHEST 1 VIEW COMPARISON:  03/01/2022 FINDINGS: Stable cardiomediastinal silhouette. Aortic atherosclerotic calcification. No focal consolidation, pleural effusion, or pneumothorax. No acute osseous abnormality. IMPRESSION: No active disease. Electronically Signed   By: Placido Sou M.D.   On: 05/01/2022 21:30    Pending Labs Unresulted Labs (From admission, onward)     Start     Ordered   05/01/22 2355  Culture, blood (Routine X 2) w Reflex to ID Panel  BLOOD CULTURE X 2,   R (with STAT occurrences)      05/01/22 2354            Vitals/Pain Today's Vitals   05/02/22 0915 05/02/22 1109 05/02/22 1130 05/02/22 1430  BP: 133/84  (!) 157/143 (!) 124/93  Pulse: 63  99 (!) 103  Resp: '16  17 15  '$ Temp:  98.7 F (37.1 C)    TempSrc:  Axillary    SpO2: 100%  100% 100%    Isolation Precautions Airborne and Contact precautions  Medications Medications  remdesivir 200 mg in sodium chloride 0.9% 250 mL IVPB (0 mg Intravenous Stopped 05/02/22 0749)    Followed by  remdesivir 100 mg in sodium chloride 0.9 % 100 mL IVPB (has no administration in time range)  guaiFENesin-dextromethorphan (ROBITUSSIN DM) 100-10 MG/5ML syrup 10 mL (has no administration in time range)  ascorbic acid (VITAMIN C) tablet 500 mg (500 mg Oral Given 05/02/22 1050)  zinc sulfate capsule 220 mg (220 mg Oral Given 05/02/22 1050)  multivitamin with minerals tablet 1 tablet (1 tablet Oral Given 05/02/22 1050)  Ipratropium-Albuterol (COMBIVENT) respimat 1 puff (1 puff Inhalation Given 05/02/22 1334)  ceFEPIme (MAXIPIME) 2 g in sodium chloride 0.9 %  100 mL IVPB (2 g  Intravenous New Bag/Given 05/02/22 1436)  metroNIDAZOLE (FLAGYL) IVPB 500 mg (0 mg Intravenous Stopped 05/02/22 1436)  vancomycin (VANCOREADY) IVPB 1250 mg/250 mL (has no administration in time range)  apixaban (ELIQUIS) tablet 5 mg (5 mg Oral Given 05/02/22 1050)  rosuvastatin (CRESTOR) tablet 20 mg (has no administration in time range)  pantoprazole (PROTONIX) EC tablet 40 mg (40 mg Oral Given 05/02/22 1050)  methylphenidate (RITALIN) tablet 5 mg (5 mg Oral Not Given 08/06/58 6301)  folic acid (FOLVITE) tablet 1 mg (1 mg Oral Given 05/02/22 1050)  fenofibrate tablet 160 mg (160 mg Oral Not Given 05/02/22 0952)  liver oil-zinc oxide (DESITIN) 40 % ointment 1 Application (1 Application Topical Given 05/02/22 1324)  dexamethasone (DECADRON) injection 10 mg (10 mg Intravenous Given 05/02/22 1324)  insulin aspart (novoLOG) injection 0-9 Units (2 Units Subcutaneous Given 05/02/22 1334)  bisacodyl (DULCOLAX) suppository 10 mg (has no administration in time range)  lactated ringers infusion ( Intravenous New Bag/Given 05/02/22 0958)  dexamethasone (DECADRON) injection 10 mg (10 mg Intravenous Given 05/02/22 0028)  metroNIDAZOLE (FLAGYL) IVPB 500 mg (0 mg Intravenous Stopped 05/02/22 0219)  ceFEPIme (MAXIPIME) 2 g in sodium chloride 0.9 % 100 mL IVPB (0 g Intravenous Stopped 05/02/22 0101)  vancomycin (VANCOREADY) IVPB 1250 mg/250 mL (0 mg Intravenous Stopped 05/02/22 0422)  iohexol (OMNIPAQUE) 350 MG/ML injection 75 mL (75 mLs Intravenous Contrast Given 05/02/22 0024)  lactated ringers bolus 1,000 mL (0 mLs Intravenous Stopped 05/02/22 0310)    Followed by  lactated ringers bolus 1,000 mL (0 mLs Intravenous Stopped 05/02/22 0422)    Mobility non-ambulatory High fall risk   Focused Assessments Neuro Assessment Handoff:  Swallow screen pass?  Waiting for speech eval swallow screen  Cardiac Rhythm: Atrial fibrillation       Neuro Assessment: Exceptions to [facility]DL Neuro Checks:      Has TPA been given? No If  patient is a Neuro Trauma and patient is going to OR before floor call report to [place] nurse: [phone] or [phone]   R Recommendations: See Admitting Provider Note  Report given to:   Additional Notes:  Pts family at bedside intermittently, insistent on tumor operation even after neurosurgery declined. Full Code.

## 2022-05-02 NOTE — Progress Notes (Signed)
SLP Cancellation Note  Patient Details Name: Cassandra Dodson MRN: [id number] DOB: 1940/05/10   Cancelled treatment:        Received swallow eval orders. Noted pt NPO and had upcoming procedures (CT pelvis and angio). Reached out to MD to clarify NPO and it wish for swallow eval to be done. He stated he thinks she is NPO because of her lethargy. Noted RN's progress note who had difficulty attempting to give meds earlier. ST will defer until tomorrow.    [name] 05/02/2022, 1:54 PM

## 2022-05-02 NOTE — ED Notes (Addendum)
CBG 231

## 2022-05-02 NOTE — ED Notes (Signed)
Critical lactic of 3.2 communicated to Pollina MD at this time

## 2022-05-02 NOTE — Hospital Course (Signed)
82 y.o. 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 Pt found to be pos for covid. UA suggestive of pyuria and L foot with concerns of heel osteomyelitis.

## 2022-05-03 ENCOUNTER — Inpatient Hospital Stay (HOSPITAL_COMMUNITY): Payer: Medicare Other

## 2022-05-03 DIAGNOSIS — U071 COVID-19: Secondary | ICD-10-CM | POA: Diagnosis not present

## 2022-05-03 DIAGNOSIS — L0390 Cellulitis, unspecified: Secondary | ICD-10-CM | POA: Diagnosis not present

## 2022-05-03 DIAGNOSIS — M869 Osteomyelitis, unspecified: Secondary | ICD-10-CM

## 2022-05-03 DIAGNOSIS — I70202 Unspecified atherosclerosis of native arteries of extremities, left leg: Secondary | ICD-10-CM

## 2022-05-03 DIAGNOSIS — R4182 Altered mental status, unspecified: Secondary | ICD-10-CM | POA: Diagnosis not present

## 2022-05-03 LAB — COMPREHENSIVE METABOLIC PANEL
ALT: 35 U/L (ref 0–44)
AST: 24 U/L (ref 15–41)
Albumin: 1.9 g/dL — ABNORMAL LOW (ref 3.5–5.0)
Alkaline Phosphatase: 63 U/L (ref 38–126)
Anion gap: 12 (ref 5–15)
BUN: 16 mg/dL (ref 8–23)
CO2: 25 mmol/L (ref 22–32)
Calcium: 9.3 mg/dL (ref 8.9–10.3)
Chloride: 100 mmol/L (ref 98–111)
Creatinine, Ser: 0.62 mg/dL (ref 0.44–1.00)
GFR, Estimated: >60 mL/min (ref >60)
Glucose, Bld: 198 mg/dL — ABNORMAL HIGH (ref 70–99)
Potassium: 3.7 mmol/L (ref 3.5–5.1)
Sodium: 137 mmol/L (ref 135–145)
Total Bilirubin: 0.5 mg/dL (ref 0.3–1.2)
Total Protein: 5.8 g/dL — ABNORMAL LOW (ref 6.5–8.1)

## 2022-05-03 LAB — CBC
HCT: 30.8 % — ABNORMAL LOW (ref 36.0–46.0)
Hemoglobin: 9.7 g/dL — ABNORMAL LOW (ref 12.0–15.0)
MCH: 26.7 pg (ref 26.0–34.0)
MCHC: 31.5 g/dL (ref 30.0–36.0)
MCV: 84.8 fL (ref 80.0–100.0)
Platelets: 299 10*3/uL (ref 150–400)
RBC: 3.63 MIL/uL — ABNORMAL LOW (ref 3.87–5.11)
RDW: 14.6 % (ref 11.5–15.5)
WBC: 6.7 10*3/uL (ref 4.0–10.5)
nRBC: 0 % (ref 0.0–0.2)

## 2022-05-03 LAB — GLUCOSE, CAPILLARY
Glucose-Capillary: 173 mg/dL — ABNORMAL HIGH (ref 70–99)
Glucose-Capillary: 181 mg/dL — ABNORMAL HIGH (ref 70–99)
Glucose-Capillary: 202 mg/dL — ABNORMAL HIGH (ref 70–99)
Glucose-Capillary: 185 mg/dL — ABNORMAL HIGH (ref 70–99)
Glucose-Capillary: 132 mg/dL — ABNORMAL HIGH (ref 70–99)
Glucose-Capillary: 155 mg/dL — ABNORMAL HIGH (ref 70–99)

## 2022-05-03 LAB — D-DIMER, QUANTITATIVE: D-Dimer, Quant: 0.71 ug/mL-FEU — ABNORMAL HIGH (ref 0.00–0.50)

## 2022-05-03 LAB — FERRITIN: Ferritin: 158 ng/mL (ref 11–307)

## 2022-05-03 LAB — C-REACTIVE PROTEIN: CRP: 10.9 mg/dL — ABNORMAL HIGH (ref <1.0)

## 2022-05-03 MED ORDER — INSULIN ASPART 100 UNIT/ML IJ SOLN
0.0000 [IU] | Freq: Three times a day (TID) | INTRAMUSCULAR | Status: DC
  Administered 2022-05-04: 2 [IU] via SUBCUTANEOUS
  Administered 2022-05-04 – 2022-05-06 (×6): 3 [IU] via SUBCUTANEOUS
  Administered 2022-05-06: 2 [IU] via SUBCUTANEOUS
  Administered 2022-05-07: 3 [IU] via SUBCUTANEOUS
  Administered 2022-05-07: 5 [IU] via SUBCUTANEOUS
  Administered 2022-05-08 (×2): 11 [IU] via SUBCUTANEOUS
  Administered 2022-05-08: 5 [IU] via SUBCUTANEOUS
  Administered 2022-05-09: 8 [IU] via SUBCUTANEOUS
  Administered 2022-05-09: 5 [IU] via SUBCUTANEOUS
  Administered 2022-05-09: 8 [IU] via SUBCUTANEOUS
  Administered 2022-05-10 – 2022-05-11 (×4): 3 [IU] via SUBCUTANEOUS
  Administered 2022-05-11 (×2): 5 [IU] via SUBCUTANEOUS
  Administered 2022-05-12: 2 [IU] via SUBCUTANEOUS
  Administered 2022-05-12 – 2022-05-13 (×4): 5 [IU] via SUBCUTANEOUS
  Administered 2022-05-14: 2 [IU] via SUBCUTANEOUS

## 2022-05-03 MED ORDER — INSULIN ASPART 100 UNIT/ML IJ SOLN
0.0000 [IU] | Freq: Every day | INTRAMUSCULAR | Status: DC
  Administered 2022-05-04: 4 [IU] via SUBCUTANEOUS
  Administered 2022-05-07 – 2022-05-09 (×3): 3 [IU] via SUBCUTANEOUS
  Administered 2022-05-10: 2 [IU] via SUBCUTANEOUS
  Administered 2022-05-12: 3 [IU] via SUBCUTANEOUS

## 2022-05-03 MED ORDER — MEDIHONEY WOUND/BURN DRESSING EX PSTE
1.0000 | PASTE | Freq: Every day | CUTANEOUS | Status: DC
  Administered 2022-05-03 – 2022-05-14 (×12): 1 via TOPICAL
  Filled 2022-05-03: qty 44

## 2022-05-03 MED ORDER — IPRATROPIUM-ALBUTEROL 20-100 MCG/ACT IN AERS: 1.0000 | INHALATION_SPRAY | Freq: Four times a day (QID) | RESPIRATORY_TRACT | Status: DC | PRN

## 2022-05-03 MED ORDER — ENSURE ENLIVE PO LIQD
237.0000 mL | Freq: Two times a day (BID) | ORAL | Status: DC
  Administered 2022-05-04 – 2022-05-09 (×9): 237 mL via ORAL

## 2022-05-03 NOTE — Progress Notes (Signed)
ABI's have been completed. Preliminary results can be found in CV Proc through chart review.   05/03/22 4:25 PM Cassandra Dodson RVT

## 2022-05-03 NOTE — Progress Notes (Signed)
Progress Note   Patient: Cassandra Dodson HDQ:[id number] DOB: 10/22/1940 DOA: 05/01/2022     1 DOS: the patient was seen and examined on 05/03/2022   Brief hospital course: 82 y.o. 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 Pt found to be pos for covid. UA suggestive of pyuria and L foot with concerns of heel osteomyelitis.  Assessment and Plan: Acute metabolic encephalopathy, suspect multifactorial secondary to sepsis, COVID-19 viral infection. Reorient as needed Continue delirium precautions Aspiration precautions Fall precautions Initially lethargic, now more alert, passed dysphagia 2 diet with thin liquids   COVID-19 viral infection, POA COVID-19 screening test positive on 05/01/2022. No significant COVID-19 pneumonitis on CT scan. Started on IV remdesivir, vitamin C, zinc Antitussives as needed CRP initially elevated at 13.8, now down to 10.9 Bronchodilators Antitussives as needed Follow inflammatory markers   Acute on chronic hypoxic respiratory failure secondary to COVID-19 viral infection At baseline on 2 L nasal cannula Presented on 4 L [place], down to [facility] Bronchodilators every 6 hours as needed   Known meningioma CT head without contrast done on 05/02/2022 showed minimally enlarged 5.4 cm planum meningioma with extensive vasogenic edema and slightly progressive 18 mm left-to-right midline shift. Started on IV Decadron per neurosurgery's recommendation. Seen by Dr. Ellene Route 1/11. Had also personally discussed case with Dr Ellene Route on 1/12. Pt is not a  surgical candidate. Recs to continue steroids per above -Have consulted Palliative Care   Left heel pressure wound/osteomyelitis, POA Concern for possible osteomyelitis, seen on left foot x-ray. Started on IV vancomycin, IV Flagyl, and cefepime in the ED, continue for now Follow blood culture neg thus far Consulted Ortho, appreciate recs. Not candidate for surgery given comorbidities. Recommendations instead for wound care with abx. Santyl dressing changes noted per Ortho   History of saddle pulmonary embolism continue home Eliquis   Hyperlipidemia cont home regimen.   Chronic constipation Bowel regimen   Type 2 diabetes with hyperglycemia Continue with SSI as needed  UTI with sepsis present on admission UA suggestive of UTI WBC elevated at 11.2 with encephalopathy Broad spec abx started at time of admit, would cont for now -Ordered urine culture, pending -Of note, pt last noted to have pan-sensitive acinetobacter and ecoli in 11/23 urine and ESBL klebsiella in 10/23 urine      Subjective: Much more alert and conversant, pleasantly confused however  Physical Exam: Vitals:   05/03/22 0549 05/03/22 0834 05/03/22 1157 05/03/22 1615  BP: (!) 163/88 135/85 (!) 151/98 (!) 149/87  Pulse: 98 95 (!) 110 (!) 112  Resp: '16 18 18 18  '$ Temp:  (!) 97.5 F (36.4 C) 98 F (36.7 C) (!) 97.5 F (36.4 C)  TempSrc:  Axillary Axillary Axillary  SpO2: 100% 100% 100% 100%   General exam: Conversant, in no acute distress Respiratory system: normal chest rise, clear, no audible wheezing Cardiovascular system: regular rhythm, s1-s2 Gastrointestinal system: Nondistended, nontender, pos BS Central nervous system: No seizures, no tremors Extremities: No cyanosis, no joint deformities Skin: No rashes, no pallor Psychiatry: Unable to assess given mentation   Data Reviewed:  Labs reviewed: Na 137, K 3.7, Cr 0.62, CRP 10.9,  WBC 6.7, Hgb 9.7  Family Communication: Pt in room, family not at  bedside  Disposition: Status is: Inpatient Remains inpatient appropriate because: Severity of illness  Planned Discharge Destination: Skilled nursing facility    Author: Marylu Lund, MD 05/03/2022 4:23 PM  For on call review [email or web address].

## 2022-05-03 NOTE — Evaluation (Signed)
Clinical/Bedside Swallow Evaluation Patient Details  Name: Cassandra Dodson MRN: [id number] Date of Birth: [birth year]  Today's Date: 05/03/2022 Time: SLP Start Time (ACUTE ONLY): 75 SLP Stop Time (ACUTE ONLY): 1950 SLP Time Calculation (min) (ACUTE ONLY): 15 min  Past Medical History:  Past Medical History:  Diagnosis Date   Arthritis    CKD (chronic kidney disease), stage III ([place])    patient denies   Depression    Diabetes mellitus without complication ([place])    Difficult intravenous access    Endometrial cancer ([place])    GERD (gastroesophageal reflux disease)    Headache    History of radiation therapy 11/04/2019-12/01/2019   Endometrial HDR; Dr. Gery Pray   Hypertension    Hypothyroidism    Neuromuscular disorder ([place])    neuropathy in feet   Osteoarthritis    PMB (postmenopausal bleeding)    PONV (postoperative nausea and vomiting)    severe nausea and vomiting after knee replacement 06-2014, did ok with 2018 knee replacement   Urinary frequency    Wears glasses    Past Surgical History:  Past Surgical History:  Procedure Laterality Date   BREAST SURGERY     cyst removed   CESAREAN SECTION     DILATION AND CURETTAGE OF UTERUS N/A 06/24/2019   Procedure: DILATATION AND CURETTAGE;  Surgeon: Lafonda Mosses, MD;  Location: [place];  Service: Gynecology;  Laterality: N/A;   FRACTURE SURGERY     right knee   INTRAUTERINE DEVICE (IUD) INSERTION N/A 06/24/2019   Procedure: INTRAUTERINE DEVICE (IUD) INSERTION MIRENA;  Surgeon: Lafonda Mosses, MD;  Location: [facility];  Service: Gynecology;  Laterality: N/A;   IR ANGIO INTRA EXTRACRAN SEL COM CAROTID INNOMINATE UNI R MOD SED  11/05/2021   IR ANGIO VERTEBRAL SEL VERTEBRAL UNI R MOD SED  11/05/2021   IR ANGIOGRAM PULMONARY BILATERAL SELECTIVE  01/28/2022   IR ANGIOGRAM SELECTIVE EACH ADDITIONAL VESSEL  01/28/2022   IR ANGIOGRAM SELECTIVE EACH ADDITIONAL VESSEL  01/28/2022   IR CT HEAD LTD   11/01/2021   IR INTRA CRAN STENT  11/01/2021   IR RADIOLOGIST EVAL & MGMT  12/19/2021   IR THROMBECT PRIM MECH INIT (INCLU) MOD SED  01/28/2022   IR THROMBECT PRIM MECH INIT (INCLU) MOD SED  01/28/2022   IR US GUIDE VASC ACCESS RIGHT  11/01/2021   IR US GUIDE VASC ACCESS RIGHT  01/28/2022   IRRIGATION AND DEBRIDEMENT SEBACEOUS CYST     JOINT REPLACEMENT Right    right   LYMPH NODE DISSECTION N/A 09/14/2019   Procedure: LYMPH NODE DISSECTION;  Surgeon: Lafonda Mosses, MD;  Location: [facility];  Service: Gynecology;  Laterality: N/A;   RADIOLOGY WITH ANESTHESIA N/A 11/01/2021   Procedure: ANGIOPLASTY/STENT;  Surgeon: Luanne Bras, MD;  Location: [place];  Service: Radiology;  Laterality: N/A;   RADIOLOGY WITH ANESTHESIA N/A 01/28/2022   Procedure: IR WITH ANESTHESIA;  Surgeon: Radiologist, Medication, MD;  Location: [place];  Service: Radiology;  Laterality: N/A;   ROBOTIC ASSISTED TOTAL HYSTERECTOMY WITH BILATERAL SALPINGO OOPHERECTOMY Bilateral 09/14/2019   Procedure: XI ROBOTIC ASSISTED TOTAL HYSTERECTOMY WITH BILATERAL SALPINGO OOPHORECTOMY;  Surgeon: Lafonda Mosses, MD;  Location: [facility];  Service: Gynecology;  Laterality: Bilateral;   SENTINEL NODE BIOPSY N/A 09/14/2019   Procedure: SENTINEL NODE BIOPSY;  Surgeon: Lafonda Mosses, MD;  Location: [facility];  Service: Gynecology;  Laterality: N/A;   teeth extration     TONSILLECTOMY     TOTAL  HIP ARTHROPLASTY Right 02/23/2015   Procedure: RIGHT TOTAL HIP ARTHROPLASTY ANTERIOR APPROACH;  Surgeon: Leandrew Koyanagi, MD;  Location: [place];  Service: Orthopedics;  Laterality: Right;   TOTAL KNEE ARTHROPLASTY Left 06/27/2016   Procedure: LEFT TOTAL KNEE ARTHROPLASTY;  Surgeon: Leandrew Koyanagi, MD;  Location: [place];  Service: Orthopedics;  Laterality: Left;   HPI:  82 y.o. 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 Pt found to be pos for covid. UA suggestive of pyuria and L foot with concerns of heel osteomyelitis. MBS 03/06/22 penetration trace that cleared with larger sips thin and nectar; reg/thin recommended.    Assessment / Plan / Recommendation  Clinical Impression  Pt alert, echolalic and did not follow commands but readily accepted po's. Pt has aproximately 5 teeth visible in poor condition. She took around 4 oz water over course of evaluation without cough but noted intermittent wet vocal quality not able to follow command to throat clear. Her oral manipulation and transit was timely with applesauce and masticated small pieces of graham cracker with only minimal labial residue. Given her current illness, history of dysphagia and confusion will initiate more conservative texture of Dys 2 and advance as she is able. Thin liquids, straws and pills whole in puree. She needs full assist during meals. ST to follow. SLP Visit Diagnosis: Dysphagia, unspecified (R13.10)    Aspiration Risk  Mild aspiration risk    Diet Recommendation Dysphagia 2 (Fine chop);Thin liquid   Liquid Administration via: Straw;Cup Medication Administration: Whole meds with puree Supervision: Staff to assist with self feeding;Full supervision/cueing for compensatory strategies Compensations: Slow rate;Small sips/bites;Minimize environmental distractions Postural Changes: Seated upright at 90 degrees    Other  Recommendations Oral Care Recommendations: Oral care BID    Recommendations for follow up therapy are one component of a multi-disciplinary discharge planning process, led by the attending physician.  Recommendations may be updated based on  patient status, additional functional criteria and insurance authorization.  Follow up Recommendations Skilled nursing-short term rehab (<3 hours/day)      Assistance Recommended at Discharge    Functional Status Assessment Patient has had a recent decline in their functional status and demonstrates the ability to make significant improvements in function in a reasonable and predictable amount of time.  Frequency and Duration min 2x/week  2 weeks       Prognosis Prognosis for Safe Diet Advancement: Good Barriers to Reach Goals: Cognitive deficits      Swallow Study   General Date of Onset: 05/01/22 HPI: 82 y.o. 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 Pt found to be pos for covid. UA suggestive of pyuria and L foot with concerns of heel osteomyelitis. MBS 03/06/22 penetration trace that cleared with larger sips thin and nectar; reg/thin recommended. Type of Study: Bedside Swallow  Evaluation Previous Swallow Assessment:  (see HPI) Diet Prior to this Study: NPO Temperature Spikes Noted: No Respiratory Status: Nasal cannula History of Recent Intubation: No Behavior/Cognition: Alert;Cooperative;Pleasant mood;Confused;Requires cueing Oral Cavity Assessment: Dry Oral Care Completed by SLP: Other (Comment) (on lips) Oral Cavity - Dentition: Poor condition;Missing dentition (5 teeth visible) Vision: Functional for self-feeding Self-Feeding Abilities: Needs assist Patient Positioning: Upright in bed Baseline Vocal Quality: Normal Volitional Cough: Weak Volitional  Swallow: Unable to elicit    Oral/Motor/Sensory Function Overall Oral Motor/Sensory Function:  (could not follow command- no sig focal weakness)   Ice Chips Ice chips: Not tested   Thin Liquid Thin Liquid: Impaired Presentation: Straw Pharyngeal  Phase Impairments: Wet Vocal Quality    Nectar Thick Nectar Thick Liquid: Not tested   Honey Thick Honey Thick Liquid: Not tested   Puree Puree: Within functional limits   Solid     Solid: Within functional limits      [name] 05/03/2022,9:47 AM

## 2022-05-03 NOTE — Consult Note (Signed)
ORTHOPAEDIC CONSULTATION  REQUESTING PHYSICIAN: Donne Hazel, MD  Chief Complaint: Left heel decubitus ulcer.  HPI: Cassandra Dodson is a 82 y.o. female who presents with altered mental status status post stroke with diabetes and multiple medical problems.  Patient has been essentially nonambulatory and has developed a decubitus heel ulcer on the left despite conservative treatment with foot pillow cushions.  Past Medical History:  Diagnosis Date   Arthritis    CKD (chronic kidney disease), stage III ([place])    patient denies   Depression    Diabetes mellitus without complication ([place])    Difficult intravenous access    Endometrial cancer ([place])    GERD (gastroesophageal reflux disease)    Headache    History of radiation therapy 11/04/2019-12/01/2019   Endometrial HDR; Dr. Gery Pray   Hypertension    Hypothyroidism    Neuromuscular disorder ([place])    neuropathy in feet   Osteoarthritis    PMB (postmenopausal bleeding)    PONV (postoperative nausea and vomiting)    severe nausea and vomiting after knee replacement 06-2014, did ok with 2018 knee replacement   Urinary frequency    Wears glasses    Past Surgical History:  Procedure Laterality Date   BREAST SURGERY     cyst removed   CESAREAN SECTION     DILATION AND CURETTAGE OF UTERUS N/A 06/24/2019   Procedure: DILATATION AND CURETTAGE;  Surgeon: Lafonda Mosses, MD;  Location: [place];  Service: Gynecology;  Laterality: N/A;   FRACTURE SURGERY     right knee   INTRAUTERINE DEVICE (IUD) INSERTION N/A 06/24/2019   Procedure: INTRAUTERINE DEVICE (IUD) INSERTION MIRENA;  Surgeon: Lafonda Mosses, MD;  Location: [facility];  Service: Gynecology;  Laterality: N/A;   IR ANGIO INTRA EXTRACRAN SEL COM CAROTID INNOMINATE UNI R MOD SED  11/05/2021   IR ANGIO VERTEBRAL SEL VERTEBRAL UNI R MOD SED  11/05/2021   IR ANGIOGRAM PULMONARY BILATERAL SELECTIVE  01/28/2022   IR ANGIOGRAM SELECTIVE EACH  ADDITIONAL VESSEL  01/28/2022   IR ANGIOGRAM SELECTIVE EACH ADDITIONAL VESSEL  01/28/2022   IR CT HEAD LTD  11/01/2021   IR INTRA CRAN STENT  11/01/2021   IR RADIOLOGIST EVAL & MGMT  12/19/2021   IR THROMBECT PRIM MECH INIT (INCLU) MOD SED  01/28/2022   IR THROMBECT PRIM MECH INIT (INCLU) MOD SED  01/28/2022   IR US GUIDE VASC ACCESS RIGHT  11/01/2021   IR US GUIDE VASC ACCESS RIGHT  01/28/2022   IRRIGATION AND DEBRIDEMENT SEBACEOUS CYST     JOINT REPLACEMENT Right    right   LYMPH NODE DISSECTION N/A 09/14/2019   Procedure: LYMPH NODE DISSECTION;  Surgeon: Lafonda Mosses, MD;  Location: [facility];  Service: Gynecology;  Laterality: N/A;   RADIOLOGY WITH ANESTHESIA N/A 11/01/2021   Procedure: ANGIOPLASTY/STENT;  Surgeon: Luanne Bras, MD;  Location: [place];  Service: Radiology;  Laterality: N/A;   RADIOLOGY WITH ANESTHESIA N/A 01/28/2022   Procedure: IR WITH ANESTHESIA;  Surgeon: Radiologist, Medication, MD;  Location: [place];  Service: Radiology;  Laterality: N/A;   ROBOTIC ASSISTED TOTAL HYSTERECTOMY WITH BILATERAL SALPINGO OOPHERECTOMY Bilateral 09/14/2019   Procedure: XI ROBOTIC ASSISTED TOTAL HYSTERECTOMY WITH BILATERAL SALPINGO OOPHORECTOMY;  Surgeon: Lafonda Mosses, MD;  Location: [facility];  Service: Gynecology;  Laterality: Bilateral;   SENTINEL NODE BIOPSY N/A 09/14/2019   Procedure: SENTINEL NODE BIOPSY;  Surgeon: Lafonda Mosses, MD;  Location: [facility];  Service: Gynecology;  Laterality: N/A;   teeth extration     TONSILLECTOMY     TOTAL HIP ARTHROPLASTY Right 02/23/2015   Procedure: RIGHT TOTAL HIP ARTHROPLASTY ANTERIOR APPROACH;  Surgeon: Leandrew Koyanagi, MD;  Location: [place];  Service: Orthopedics;  Laterality: Right;   TOTAL KNEE ARTHROPLASTY Left 06/27/2016   Procedure: LEFT TOTAL KNEE ARTHROPLASTY;  Surgeon: Leandrew Koyanagi, MD;  Location: [place];  Service: Orthopedics;  Laterality: Left;   Social History   Socioeconomic History   Marital status: Widowed    Spouse name: Not on  file   Number of children: Not on file   Years of education: Not on file   Highest education level: Not on file  Occupational History   Not on file  Tobacco Use   Smoking status: Former    Packs/day: 1.50    Years: 45.00    Total pack years: 67.50    Types: Cigarettes    Quit date: 04/22/2002    Years since quitting: 20.0   Smokeless tobacco: Never  Vaping Use   Vaping Use: Never used  Substance and Sexual Activity   Alcohol use: Yes    Comment: rarely   Drug use: No   Sexual activity: Not Currently  Other Topics Concern   Not on file  Social History Narrative   Not on file   Social Determinants of Health   Financial Resource Strain: Not on file  Food Insecurity: No Food Insecurity (03/02/2022)   Hunger Vital Sign    Worried About Running Out of Food in the Last Year: Never true    [place] in the Last Year: Never true  Transportation Needs: No Transportation Needs (03/01/2022)   PRAPARE - Hydrologist (Medical): No    Lack of Transportation (Non-Medical): No  Physical Activity: Not on file  Stress: Not on file  Social Connections: Not on file   Family History  Problem Relation Age of Onset   Pancreatic cancer Mother    Stroke Father    Hypertension Father    Colon cancer Neg Hx    Breast cancer Neg Hx    Ovarian cancer Neg Hx    Endometrial cancer Neg Hx    Prostate cancer Neg Hx    - negative except otherwise stated in the family history section Allergies  Allergen Reactions   Anesthetics, Amide Nausea And Vomiting    Pt is intolerant to general anesthesia. Pt will throw up and has thrown up during procedure.    Ether Nausea And Vomiting   Prior to Admission medications   Medication Sig Start Date End Date Taking? Authorizing Provider  acetaminophen (TYLENOL) 325 MG tablet Take 650 mg by mouth every 6 (six) hours as needed for moderate pain.   Yes [provider]  Amino Acids-Protein Hydrolys (FEEDING  SUPPLEMENT, PRO-STAT SUGAR FREE 64,) LIQD Take 30 mLs by mouth 2 (two) times daily.   Yes [provider]  antiseptic oral rinse (BIOTENE) LIQD 15 mLs by Mouth Rinse route daily as needed for dry mouth.   Yes [provider]  apixaban (ELIQUIS) 5 MG TABS tablet Take 1 tablet (5 mg total) by mouth 2 (two) times daily. 02/16/22  Yes Lorella Nimrod, MD  aspirin EC 81 MG tablet Take 1 tablet (81 mg total) by mouth daily. Swallow whole. 03/07/22  Yes Little Ishikawa, MD  benzocaine-menthol (CHLORAEPTIC) 6-10 MG lozenge Take 1 lozenge by mouth every 2 (two) hours as needed for  sore throat.   Yes [provider]  bisacodyl (DULCOLAX) 10 MG suppository Place 10 mg rectally daily as needed (constipation not relieved by Milk of Magnesia).   Yes [provider]  cephALEXin (KEFLEX) 250 MG capsule Take 250 mg by mouth 3 (three) times daily. 04/28/22  Yes [provider]  docusate sodium (COLACE) 100 MG capsule Take 1 capsule (100 mg total) by mouth 2 (two) times daily. 02/16/22  Yes Lorella Nimrod, MD  fenofibrate 160 MG tablet Take 1 tablet (160 mg total) by mouth daily after breakfast. Patient taking differently: Take 160 mg by mouth daily. 11/27/21  Yes Love, Ivan Anchors, PA-C  folic acid (FOLVITE) 1 MG tablet Take 1 tablet (1 mg total) by mouth daily. 11/27/21  Yes Love, Ivan Anchors, PA-C  guaiFENesin 200 MG/10ML LIQD Take 10 mLs by mouth every 4 (four) hours as needed (for cough for 2 days - active order).   Yes [provider]  insulin glargine (LANTUS) 100 UNIT/ML Solostar Pen Inject 15 Units into the skin daily. Patient taking differently: Inject 15 Units into the skin at bedtime. 02/16/22  Yes Lorella Nimrod, MD  insulin lispro (HUMALOG) 100 UNIT/ML KwikPen Inject 3 Units into the skin 3 (three) times daily with meals. Patient taking differently: Inject 2-10 Units into the skin 3 (three) times daily. Per sliding scale insulin: 0-150= 0 units, 151-200= 2 units,  201-250= 4 units, 251-300= 6 units, 301-350= 8 units, 351-400= 10 units, 401 or greater, call MD 02/16/22  Yes Lorella Nimrod, MD  Magnesium Hydroxide (MILK OF MAGNESIA PO) Take 30 mLs by mouth daily as needed (constipation).   Yes [provider]  methylphenidate (RITALIN) 5 MG tablet Take 1 tablet (5 mg total) by mouth 2 (two) times daily with breakfast and lunch. Patient taking differently: Take 5 mg by mouth 2 (two) times daily. 11/27/21  Yes Love, Ivan Anchors, PA-C  Nutritional Supplements (NUTRITIONAL DRINK MIX PO) Take 1 Dose by mouth 3 (three) times daily. Magic Cup   Yes [provider]  pantoprazole (PROTONIX) 40 MG tablet Take 1 tablet (40 mg total) by mouth daily. Patient taking differently: Take 40 mg by mouth in the morning. 11/27/21  Yes Love, Ivan Anchors, PA-C  rosuvastatin (CRESTOR) 20 MG tablet Take 1 tablet (20 mg total) by mouth daily. Patient taking differently: Take 20 mg by mouth at bedtime. 11/27/21  Yes Love, Ivan Anchors, PA-C  senna (SENOKOT) 8.6 MG TABS tablet Take 1.5 tablets (12.9 mg total) by mouth at bedtime as needed. Patient taking differently: Take 12.9 mg by mouth at bedtime as needed (constipation). 02/16/22  Yes Lorella Nimrod, MD  Sodium Phosphates (RA SALINE ENEMA RE) Place 1 enema rectally daily as needed (constipation not relieved by bisacodyl suppository).   Yes [provider]  Zinc Oxide (DESITIN) 13 % CREA Apply 1 application  topically 2 (two) times daily. To buttocks for protection. Patient taking differently: Apply 1 application  topically 2 (two) times daily. To buttocks 11/27/21  Yes Love, Ivan Anchors, PA-C  zolpidem (AMBIEN) 5 MG tablet Take 2.5 mg by mouth at bedtime as needed for sleep.   Yes [provider]  Insulin Pen Needle 32G X 4 MM MISC Use daily in the morning, at noon, and at bedtime. 11/27/21   Bary Leriche, PA-C   CT ABDOMEN PELVIS W CONTRAST  Result Date: 05/02/2022 CLINICAL DATA:  Study identified as missing a report  at 4:02 am on 05/02/2022. 82 year old female with abdomen and  flank pain. Increasing shortness of breath. EXAM: CT ABDOMEN AND PELVIS WITH CONTRAST TECHNIQUE: Multidetector CT imaging of the abdomen and pelvis was performed using the standard protocol following bolus administration of intravenous contrast. RADIATION DOSE REDUCTION: This exam was performed according to the departmental dose-optimization program which includes automated exposure control, adjustment of the mA and/or kV according to patient size and/or use of iterative reconstruction technique. CONTRAST:  22m OMNIPAQUE IOHEXOL 350 MG/ML SOLN COMPARISON:  CTA chest from the same time reported separately. Prior CT Abdomen and Pelvis 01/29/2022. FINDINGS: Lower chest: Chest CTA reported separately. Hepatobiliary: Mild motion artifact. Negative liver and gallbladder. Pancreas: Atrophied, otherwise negative. Spleen: Stable and negative. Adrenals/Urinary Tract: Stable adrenal glands. Kidneys appears stable since October with symmetric renal enhancement, and symmetric contrast excretion on delayed images. No nephrolithiasis or suspicious renal lesion. Decompressed ureters. Moderately distended urinary bladder today with mild chronic bladder wall thickening. Estimated bladder volume 520 mL. Stomach/Bowel: Stool ball in the rectum, new since October and up to 12 cm in length (series 9, image 113). Upstream redundant but decompressed sigmoid colon throughout the pelvis. Decompressed descending colon with diverticulosis. Mild redundant transverse colon with retained stool. Right colon retained stool and diverticulosis. Diminutive, normal retrocecal appendix series 13, image 58. No large bowel inflammation. No dilated small bowel. Decompressed stomach and duodenum. No free air or free fluid. Vascular/Lymphatic: Aortoiliac calcified atherosclerosis. Normal caliber abdominal aorta. Major arterial structures remain patent. Portal venous system is patent. No  lymphadenopathy identified. Reproductive: Surgically absent uterus. Diminutive or absent ovaries. Other: No pelvic free fluid. Presacral stranding has decreased since October but not resolved. Musculoskeletal: Advanced lumbar spine degeneration. Right hip arthroplasty. No acute osseous abnormality identified. IMPRESSION: 1. Stool ball in the rectum up to 12 cm in length, suspicious for Fecal Impaction. 2. Distended urinary bladder (520 mL) might be urinary retention secondary to #1. 3. No other acute or inflammatory process identified in the abdomen or pelvis. Chest CTA at the same time reported separately. Advanced lumbar spine degeneration. Aortic Atherosclerosis (ICD10-I70.0). Electronically Signed   By: HGenevie AnnM.D.   On: 05/02/2022 04:09   CT Angio Chest PE W and/or Wo Contrast  Result Date: 05/02/2022 CLINICAL DATA:  Worsening shortness of breath. EXAM: CT ANGIOGRAPHY CHEST WITH CONTRAST TECHNIQUE: Multidetector CT imaging of the chest was performed using the standard protocol during bolus administration of intravenous contrast. Multiplanar CT image reconstructions and MIPs were obtained to evaluate the vascular anatomy. RADIATION DOSE REDUCTION: This exam was performed according to the departmental dose-optimization program which includes automated exposure control, adjustment of the mA and/or kV according to patient size and/or use of iterative reconstruction technique. CONTRAST:  758mOMNIPAQUE IOHEXOL 350 MG/ML SOLN COMPARISON:  None Available. FINDINGS: Cardiovascular: There is marked severity calcification of the aortic arch, without evidence of aortic aneurysm. Satisfactory opacification of the pulmonary arteries to the segmental level. No evidence of pulmonary embolism. Normal heart size with marked severity coronary artery calcification. No pericardial effusion. Mediastinum/Nodes: No enlarged mediastinal, hilar, or axillary lymph nodes. Thyroid gland, trachea, and esophagus demonstrate no  significant findings. Lungs/Pleura: Mild to moderate severity areas of scarring and/or atelectasis are seen within the bilateral apices mild posterior bibasilar atelectasis is also noted. There is no evidence of an acute infiltrate, pleural effusion or pneumothorax. Upper Abdomen: No acute abnormality. Musculoskeletal: Multilevel degenerative changes are seen throughout the thoracic spine. Review of the MIP images confirms the above findings. IMPRESSION: 1. No evidence of pulmonary embolism or other acute intrathoracic process.  2. Mild to moderate severity biapical and bibasilar scarring and/or atelectasis. 3. Marked severity coronary artery calcification. 4. Aortic atherosclerosis. Aortic Atherosclerosis (ICD10-I70.0). Electronically Signed   By: Virgina Norfolk M.D.   On: 05/02/2022 00:46   CT Head Wo Contrast  Result Date: 05/02/2022 CLINICAL DATA:  Altered mental status, nontraumatic (Ped 0-17y) EXAM: CT HEAD WITHOUT CONTRAST TECHNIQUE: Contiguous axial images were obtained from the base of the skull through the vertex without intravenous contrast. RADIATION DOSE REDUCTION: This exam was performed according to the departmental dose-optimization program which includes automated exposure control, adjustment of the mA and/or kV according to patient size and/or use of iterative reconstruction technique. 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 Remote bilateral cerebellar infarcts and right occipital cortical infarcts are again noted. Remote infarct within the right pons partially obscured by streak artifact but grossly stable. No acute intracranial hemorrhage or infarct. Ventricular size is stable. Vascular: Vascular stent again noted within the basilar  artery. No asymmetric hyperdense vasculature at the skull base. Skull: Normal. Negative for fracture or focal lesion. Sinuses/Orbits: No acute finding. Other: Mastoid air cells and middle ear cavities are clear. IMPRESSION: 1. 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 2. 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 Remote right pontine infarct. 3. No acute intracranial hemorrhage or infarct. Electronically Signed   By: Fidela Salisbury M.D.   On: 05/02/2022 00:31   DG Foot Complete Left  Result Date: 05/01/2022 CLINICAL DATA:  Left foot wound. EXAM: LEFT FOOT - COMPLETE 3+ VIEW COMPARISON:  None Available. FINDINGS: There is no evidence of fracture or dislocation. Lytic/sclerotic appearance of the first metatarsal head with hallux valgus deformity. There is mild cortical erosion and periosteal reaction about the posteroinferior aspect of the calcaneus. There is arthropathy of the multiple tarsometatarsal joints. No pericardial effusion or periosteal reaction. Soft tissue wound about the posterior aspect of the calcaneus. Soft tissue edema about the dorsum of the foot. IMPRESSION: 1. Lytic/sclerotic appearance of the first metatarsal head with hallux valgus deformity, likely a chronic processa. 2. Soft tissue wound about the posterior aspect of the calcaneus. 3. There is mild cortical erosion and periosteal reaction about the posteroinferior aspect of the calcaneus, with adjacent soft tissue wound suspicious for osteomyelitis, further evaluation with MR examination is suggested. Electronically Signed   By: Keane Police D.O.   On: 05/01/2022 22:31   DG Chest Portable 1 View  Result Date: 05/01/2022 CLINICAL DATA:  Shortness of breath EXAM: PORTABLE CHEST 1 VIEW COMPARISON:  03/01/2022 FINDINGS:  Stable cardiomediastinal silhouette. Aortic atherosclerotic calcification. No focal consolidation, pleural effusion, or pneumothorax. No acute osseous abnormality. IMPRESSION: No active disease. Electronically Signed   By: Placido Sou M.D.   On: 05/01/2022 21:30   - pertinent xrays, CT, MRI studies were reviewed and independently interpreted  Positive ROS: All other systems have been reviewed and were otherwise negative with the exception of those mentioned in the HPI and as above.  Physical Exam: General: Alert, no acute distress Psychiatric: Patient is competent for consent with normal mood and affect Lymphatic: No axillary or cervical lymphadenopathy Cardiovascular: No pedal edema Respiratory: No cyanosis, no use  of accessory musculature GI: No organomegaly, abdomen is soft and non-tender    Images:  '@ENCIMAGES'$ @  Labs:  Lab Results  Component Value Date   HGBA1C 5.0 03/02/2022   HGBA1C 6.0 (H) 01/16/2022   HGBA1C 7.0 (H) 10/26/2021   ESRSEDRATE 6 06/19/2016   ESRSEDRATE 13 02/13/2015   CRP 10.9 (H) 05/03/2022   CRP 13.8 (H) 05/02/2022   CRP <0.8 06/19/2016   REPTSTATUS PENDING 05/02/2022   GRAMSTAIN  01/30/2022    MODERATE WBC PRESENT,BOTH PMN AND MONONUCLEAR FEW GRAM POSITIVE COCCI IN CLUSTERS FEW GRAM NEGATIVE RODS FEW YEAST WITH PSEUDOHYPHAE Performed at [facility], [place] [address] [place], [place] [address]    CULT  05/02/2022    NO GROWTH < 12 HOURS Performed at [facility] [address] [place], [place] [address]    LABORGA ACINETOBACTER CALCOACETICUS/BAUMANNII COMPLEX (A) 03/01/2022   LABORGA ESCHERICHIA COLI (A) 03/01/2022    Lab Results  Component Value Date   ALBUMIN 1.9 (L) 05/03/2022   ALBUMIN 1.8 (L) 05/02/2022   ALBUMIN 2.0 (L) 05/01/2022        Latest Ref Rng & Units 05/03/2022    5:39 AM 05/02/2022    6:15 AM 05/01/2022    9:07 PM  CBC EXTENDED  WBC 4.0 - 10.5 K/uL 6.7  6.0  11.2   RBC 3.87 - 5.11 MIL/uL 3.63  3.91   3.83   Hemoglobin 12.0 - 15.0 g/dL 9.7  10.5  10.3   HCT 36.0 - 46.0 % 30.8  34.1  33.5   Platelets 150 - 400 K/uL 299  243  325   NEUT# 1.7 - 7.7 K/uL   9.0   Lymph# 0.7 - 4.0 K/uL   1.1     Neurologic: Patient does not have protective sensation bilateral lower extremities.   MUSCULOSKELETAL:   Skin: Examination patient has a large necrotic ulcer over the left heel that probes to bone.  This is 5 cm in diameter.  There is no surrounding cellulitis or drainage.  No ulcers on the right heel.  I cannot palpate a dorsalis pedis pulse bilaterally.  Patient's hemoglobin is 9.7 white cell count 6.7.  Albumin 1.9.  Most recent hemoglobin A1c 5.0.  Review of the radiographs shows periosteal irregularity over the calcaneus beneath the ulceration. 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  Plan: Ankle-brachial indices are ordered but I do not think that patient would be a revascularization candidate.  Would continue with wound care and oral antibiotics as needed.  I will write orders for Santyl dressing changes.  Patient would have high risk of wound healing complications with a below-knee amputation.  Thank you for the consult and the opportunity to see Ms. Deirdre Evener, [place] [phone] 9:04 AM

## 2022-05-03 NOTE — Progress Notes (Signed)
CSW confirmed with [facility] that patient is private pay there and can return when medically stable. They are aware she is COVID+.    Gilmore Laroche, MSW, [facility]

## 2022-05-03 NOTE — Progress Notes (Signed)
Initial Nutrition Assessment  DOCUMENTATION CODES:   Non-severe (moderate) malnutrition in context of chronic illness  INTERVENTION:   Multivitamin w/ minerals daily Ensure Enlive po BID, each supplement provides 350 kcal and 20 grams of protein. Encourage good PO intake Recommend obtaining new weight Feeding assist with all meals   NUTRITION DIAGNOSIS:   Moderate Malnutrition related to chronic illness as evidenced by moderate muscle depletion, mild fat depletion  GOAL:   Patient will meet greater than or equal to 90% of their needs  MONITOR:   PO intake, Supplement acceptance, Labs, Skin  REASON FOR ASSESSMENT:   Consult Diet education, Calorie Count  ASSESSMENT:   82 y.o. female presented to the ED with shortness of breath and AS from SNF. 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 Pt admitted with acute metabolic encephalopathy, likely secondary to COVID infection, and acute on chronic hypoxic respiratory failure.   01/10 - Admitted 01/12 - diet advanced to Dysphagia 2, thin liquids  RD discussed during rounds. Pt would benefit during RD visit due to ongoing wounds and current acute illness.   Pt unable to provide any information at time of RD visit. Pt with no new weight this admission, RN noticed. Per EMR, appears pt weight has fluctuated over the past year.   Medications reviewed and include: Vitamin C, Folic acid, NovoLog SSI, MVI, Protonix, Zinc sulfate, IV antibiotics  Labs reviewed: Magnesium 1.9, Hgb A1c 5.0% (03/02/22), 24 hr CBGs 132-202  NUTRITION - FOCUSED PHYSICAL EXAM:  Flowsheet Row Most Recent Value  Orbital Region Mild depletion  Upper Arm Region Mild depletion  Thoracic and Lumbar Region Mild depletion  Buccal Region Mild depletion  Temple Region Mild depletion  Clavicle Bone Region Moderate depletion  Clavicle and Acromion Bone Region Moderate depletion  Scapular Bone Region Moderate depletion   Dorsal Hand No depletion  Patellar Region Unable to assess  Anterior Thigh Region Unable to assess  Posterior Calf Region Moderate depletion  Edema (RD Assessment) None  Hair Reviewed  Eyes Unable to assess  Mouth Unable to assess  Skin Reviewed  Nails Reviewed   Diet Order:   Diet Order             DIET DYS 2 Room service appropriate? No; Fluid consistency: Thin  Diet effective now                   EDUCATION NEEDS:   Not appropriate for education at this time  Skin:  Skin Assessment: Skin Integrity Issues: Skin Integrity Issues:: Stage IV Stage IV: coccyx  Last BM:  Unknown  Height:  Ht Readings from Last 1 Encounters:  04/10/22 '5\' 3"'$  (1.6 m)   Weight:  Wt Readings from Last 1 Encounters:  04/10/22 76.7 kg   Ideal Body Weight:  52.3 kg  BMI:  There is no height or weight on file to calculate BMI.  Estimated Nutritional Needs:  Kcal:  1800-2000 Protein:  90-105 grams Fluid:  >/= 1.8 L   Hermina Barters RD, LDN Clinical Dietitian See [facility] for contact information.

## 2022-05-03 NOTE — NC FL2 (Signed)
[place] LEVEL OF CARE FORM     IDENTIFICATION  Patient Name: Cassandra Dodson Birthdate: [birth year] Sex: female Admission Date (Current Location): 05/01/2022  [facility] and [place] Number:  Herbalist and Address:  [facility] [place] [place] [address] [place], [place] [address]      Provider Number: [id number]  Attending Physician Name and Address:  Donne Hazel, MD  Relative Name and Phone Number:       Current Level of Care: Hospital Recommended Level of Care: [place] Prior Approval Number:    Date Approved/Denied:   PASRR Number: [id number]  Discharge Plan: SNF    Current Diagnoses: Patient Active Problem List   Diagnosis Date Noted   Osteomyelitis of left foot ([place]) 05/03/2022   History of CVA with residual deficit 03/01/2022   History of pulmonary embolism 03/01/2022   Chronic anticoagulation 03/01/2022   Meningioma ([place]) 03/01/2022   Malnutrition of moderate degree 02/02/2022   Pulmonary embolism with acute cor pulmonale ([place]) 01/26/2022   AMS (altered mental status) 01/11/2022   Dysphagia 11/28/2021   Normocytic anemia 11/28/2021   Fluctuating mental status 11/28/2021   Right pontine cerebrovascular accident ([place]) 11/05/2021   Pressure injury of skin 11/02/2021   Occlusion and stenosis of basilar artery 11/01/2021   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   UTI (urinary tract infection) 10/27/2021   Intracranial mass    Hyperlipidemia    Diabetes mellitus with hyperglycemia (HCC)    CVA (cerebral vascular accident) ([place]) 10/26/2021   Stage 3b chronic kidney disease (HCC)    Thrombocytopenia ([place])    History of endometrial cancer    Brain mass 10/25/2021   Endometrial cancer ([place]) 05/13/2019   Hypertension    Total knee replacement status 06/27/2016   Primary osteoarthritis of left knee 04/30/2016   Osteoarthritis of right hip 02/23/2015   Hip arthritis 02/23/2015    Orientation RESPIRATION  BLADDER Height & Weight     Self  O2 (2L nasal cannula) Incontinent, External catheter Weight:   Height:     BEHAVIORAL SYMPTOMS/MOOD NEUROLOGICAL BOWEL NUTRITION STATUS      Continent Diet (See dc summary)  AMBULATORY STATUS COMMUNICATION OF NEEDS Skin   Extensive Assist Verbally PU Stage and Appropriate Care (Stage IV on coccyx)                       Personal Care Assistance Level of Assistance  Bathing, Feeding, Dressing Bathing Assistance: Maximum assistance Feeding assistance: Limited assistance Dressing Assistance: Maximum assistance     Functional Limitations Info  Hearing   Hearing Info: Impaired      SPECIAL CARE FACTORS FREQUENCY                       Contractures Contractures Info: Not present    Additional Factors Info  Code Status, Allergies, Isolation Precautions, Insulin Sliding Scale Code Status Info: Full Allergies Info: Anesthetics, Amide, Ether   Insulin Sliding Scale Info: See dc summary Isolation Precautions Info: COVID+; ESBL     Current Medications (05/03/2022):  This is the current hospital active medication list Current Facility-Administered Medications  Medication Dose Route Frequency Provider Last Rate Last Admin   apixaban (ELIQUIS) tablet 5 mg  5 mg Oral BID Irene Pap N, DO   5 mg at 05/02/22 1050   ascorbic acid (VITAMIN C) tablet 500 mg  500 mg Oral Daily Hall, Carole N, DO   500 mg  at 05/02/22 1050   bisacodyl (DULCOLAX) suppository 10 mg  10 mg Rectal Daily PRN Irene Pap N, DO       ceFEPIme (MAXIPIME) 2 g in sodium chloride 0.9 % 100 mL IVPB  2 g Intravenous Q12H Irene Pap N, DO 200 mL/hr at 05/03/22 0919 2 g at 05/03/22 0919   fenofibrate tablet 160 mg  160 mg Oral QPC breakfast Irene Pap N, DO       folic acid (FOLVITE) tablet 1 mg  1 mg Oral Daily Daniel, Carole N, DO   1 mg at 05/02/22 1050   guaiFENesin-dextromethorphan (ROBITUSSIN DM) 100-10 MG/5ML syrup 10 mL  10 mL Oral Q4H PRN Kayleen Memos, DO        [START ON 05/04/2022] insulin aspart (novoLOG) injection 0-15 Units  0-15 Units Subcutaneous TID WC Donne Hazel, MD       insulin aspart (novoLOG) injection 0-5 Units  0-5 Units Subcutaneous QHS Donne Hazel, MD       Ipratropium-Albuterol (COMBIVENT) respimat 1 puff  1 puff Inhalation Q6H PRN Donne Hazel, MD       labetalol (NORMODYNE) injection 10 mg  10 mg Intravenous Q4H PRN Opyd, Ilene Qua, MD   10 mg at 05/03/22 9563   lactated ringers infusion   Intravenous Continuous Donne Hazel, MD 75 mL/hr at 05/02/22 0958 New Bag at 05/02/22 0958   leptospermum manuka honey (MEDIHONEY) paste 1 Application  1 Application Topical Daily Newt Minion, MD   1 Application at 87/56/43 1059   liver oil-zinc oxide (DESITIN) 40 % ointment 1 Application  1 Application Topical BID Irene Pap N, DO   1 Application at 32/95/18 1059   methylphenidate (RITALIN) tablet 5 mg  5 mg Oral BID WC Hall, Carole N, DO       metroNIDAZOLE (FLAGYL) IVPB 500 mg  500 mg Intravenous Q12H Irene Pap N, DO 100 mL/hr at 05/03/22 1221 500 mg at 05/03/22 1221   multivitamin with minerals tablet 1 tablet  1 tablet Oral Daily Irene Pap N, DO   1 tablet at 05/02/22 1050   pantoprazole (PROTONIX) EC tablet 40 mg  40 mg Oral Daily Irene Pap N, DO   40 mg at 05/02/22 1050   remdesivir 100 mg in sodium chloride 0.9 % 100 mL IVPB  100 mg Intravenous Daily Irene Pap N, DO 200 mL/hr at 05/03/22 1630 100 mg at 05/03/22 1630   rosuvastatin (CRESTOR) tablet 20 mg  20 mg Oral QHS Hall, Carole N, DO       vancomycin (VANCOREADY) IVPB 1250 mg/250 mL  1,250 mg Intravenous Q24H Irene Pap N, DO 166.7 mL/hr at 05/03/22 0050 1,250 mg at 05/03/22 0050   zinc sulfate capsule 220 mg  220 mg Oral Daily Irene Pap N, DO   220 mg at 05/02/22 1050     Discharge Medications: Please see discharge summary for a list of discharge medications.  Relevant Imaging Results:  Relevant Lab Results:   Additional Information SSN:  [id number]. Pt is vaccinated for covid with multiple boosters.  Benard Halsted, LCSW

## 2022-05-04 ENCOUNTER — Inpatient Hospital Stay (HOSPITAL_COMMUNITY): Payer: Medicare Other

## 2022-05-04 DIAGNOSIS — U071 COVID-19: Secondary | ICD-10-CM | POA: Diagnosis not present

## 2022-05-04 DIAGNOSIS — Z515 Encounter for palliative care: Secondary | ICD-10-CM | POA: Diagnosis not present

## 2022-05-04 DIAGNOSIS — M869 Osteomyelitis, unspecified: Secondary | ICD-10-CM | POA: Diagnosis not present

## 2022-05-04 DIAGNOSIS — R4182 Altered mental status, unspecified: Secondary | ICD-10-CM | POA: Diagnosis not present

## 2022-05-04 LAB — COMPREHENSIVE METABOLIC PANEL
ALT: 26 U/L (ref 0–44)
AST: 20 U/L (ref 15–41)
Albumin: 1.9 g/dL — ABNORMAL LOW (ref 3.5–5.0)
Alkaline Phosphatase: 50 U/L (ref 38–126)
Anion gap: 11 (ref 5–15)
BUN: 18 mg/dL (ref 8–23)
CO2: 26 mmol/L (ref 22–32)
Calcium: 9.1 mg/dL (ref 8.9–10.3)
Chloride: 102 mmol/L (ref 98–111)
Creatinine, Ser: 0.59 mg/dL (ref 0.44–1.00)
GFR, Estimated: >60 mL/min (ref >60)
Glucose, Bld: 150 mg/dL — ABNORMAL HIGH (ref 70–99)
Potassium: 3.5 mmol/L (ref 3.5–5.1)
Sodium: 139 mmol/L (ref 135–145)
Total Bilirubin: 0.4 mg/dL (ref 0.3–1.2)
Total Protein: 5.2 g/dL — ABNORMAL LOW (ref 6.5–8.1)

## 2022-05-04 LAB — C-REACTIVE PROTEIN: CRP: 5.4 mg/dL — ABNORMAL HIGH (ref <1.0)

## 2022-05-04 LAB — CBC
HCT: 30 % — ABNORMAL LOW (ref 36.0–46.0)
Hemoglobin: 9.5 g/dL — ABNORMAL LOW (ref 12.0–15.0)
MCH: 27.1 pg (ref 26.0–34.0)
MCHC: 31.7 g/dL (ref 30.0–36.0)
MCV: 85.7 fL (ref 80.0–100.0)
Platelets: 289 10*3/uL (ref 150–400)
RBC: 3.5 MIL/uL — ABNORMAL LOW (ref 3.87–5.11)
RDW: 14.8 % (ref 11.5–15.5)
WBC: 11.1 10*3/uL — ABNORMAL HIGH (ref 4.0–10.5)
nRBC: 0 % (ref 0.0–0.2)

## 2022-05-04 LAB — GLUCOSE, CAPILLARY
Glucose-Capillary: 147 mg/dL — ABNORMAL HIGH (ref 70–99)
Glucose-Capillary: 181 mg/dL — ABNORMAL HIGH (ref 70–99)
Glucose-Capillary: 99 mg/dL (ref 70–99)
Glucose-Capillary: 319 mg/dL — ABNORMAL HIGH (ref 70–99)

## 2022-05-04 LAB — VAS US ABI WITH/WO TBI
Left ABI: 0.78
Right ABI: 1

## 2022-05-04 LAB — ECHOCARDIOGRAM COMPLETE

## 2022-05-04 LAB — FERRITIN: Ferritin: 170 ng/mL (ref 11–307)

## 2022-05-04 LAB — D-DIMER, QUANTITATIVE: D-Dimer, Quant: 0.64 ug/mL-FEU — ABNORMAL HIGH (ref 0.00–0.50)

## 2022-05-04 MED ORDER — LABETALOL HCL 5 MG/ML IV SOLN: 5.0000 mg | INTRAVENOUS | Status: DC | PRN

## 2022-05-04 MED ORDER — METOPROLOL TARTRATE 25 MG PO TABS
25.0000 mg | ORAL_TABLET | Freq: Two times a day (BID) | ORAL | Status: DC
  Administered 2022-05-04 – 2022-05-07 (×7): 25 mg via ORAL
  Filled 2022-05-04 (×7): qty 1

## 2022-05-04 NOTE — Progress Notes (Signed)
Progress Note   Patient: Cassandra Dodson RFF:[id number] DOB: October 17, 1940 DOA: 05/01/2022     2 DOS: the patient was seen and examined on 05/04/2022   Brief hospital course: 82 y.o. 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 Pt found to be pos for covid. UA suggestive of pyuria and L foot with concerns of heel osteomyelitis.  Assessment and Plan: Acute metabolic encephalopathy, suspect multifactorial secondary to sepsis, COVID-19 viral infection. Reorient as needed Continue delirium precautions Aspiration precautions Fall precautions Initially lethargic, currently more alert, passed dysphagia 2 diet with thin liquids   COVID-19 viral infection, POA COVID-19 screening test positive on 05/01/2022. No significant COVID-19 pneumonitis on CT scan. Started on IV remdesivir, vitamin C, zinc Antitussives as needed CRP initially elevated at 13.8, now down to 5.4 Bronchodilators Antitussives as needed Follow inflammatory markers   Acute on chronic hypoxic respiratory failure secondary to COVID-19 viral infection At baseline on 2 L nasal cannula Presented on 4 L [place], currently on 4LNC Bronchodilators every 6 hours as needed   Known meningioma CT head without contrast done on 05/02/2022 showed minimally enlarged 5.4 cm planum meningioma with extensive vasogenic edema and slightly progressive 18 mm left-to-right midline shift. Started on IV Decadron per neurosurgery's recommendation. Seen by Dr. Ellene Route 1/11. Had also personally discussed case with Dr Ellene Route on 1/12. Pt is  not a surgical candidate. Recs to continue steroids per above -Palliative Care consulted, family meeting planned 1/14   Left heel pressure wound/osteomyelitis, POA Concern for possible osteomyelitis, seen on left foot x-ray. Started on IV vancomycin, IV Flagyl, and cefepime in the ED, continue for now Follow blood culture neg thus far Consulted Ortho, appreciate recs. Not candidate for surgery given comorbidities. Recommendations instead for wound care with abx. Santyl dressing changes noted per Ortho Have discussed with ID who will see pt formally   History of saddle pulmonary embolism continue home Eliquis   Hyperlipidemia cont home regimen.   Chronic constipation Bowel regimen   Type 2 diabetes with hyperglycemia Continue with SSI as needed  UTI with sepsis present on admission UA suggestive of UTI WBC elevated at 11.2 with encephalopathy Broad spec abx started at time of admit, would cont for now -Of note, pt last noted to have pan-sensitive acinetobacter and ecoli in 11/23 urine and ESBL klebsiella in 10/23 urine  Afib RVR -Likely precipitated by acute illness above -Ordered 2d echo, pending results -Have started scheduled metoprolol '25mg'$  bid with PRN labetalol for HR>120 -Will check TSH -Already on eliquis      Subjective: Alert, conversant, without complaints  Physical Exam: Vitals:   05/04/22 0839 05/04/22 0840 05/04/22 1145 05/04/22 1201  BP: (!) 151/82  (!) 150/76 (!) 156/68  Pulse:  (!) 107 (!) 106   Resp: 18  20 (!) 23  Temp: (!) 97.5 F (36.4 C)  97.9 F (36.6 C)   TempSrc: Axillary  Axillary   SpO2: 99%     Weight:    76.3 kg   General exam: Awake, laying in bed, in nad Respiratory system: Normal respiratory effort, no wheezing Cardiovascular system: regular rate, s1, s2 Gastrointestinal system: Soft, nondistended, positive BS  Central nervous system: CN2-12 grossly intact, strength intact Extremities: Perfused, no clubbing Skin: Normal skin  turgor, no notable skin lesions seen Psychiatry: Difficult to assess given mentation  Data Reviewed:  Labs reviewed: Na 139, K 3.5, Cr 0.59, CRP 5.4, WBC 11.1, Hgb 9.5  Family Communication: Pt in room, family not at bedside  Disposition: Status is: Inpatient Remains inpatient appropriate because: Severity of illness  Planned Discharge Destination: Skilled nursing facility    Author: Marylu Lund, MD 05/04/2022 5:15 PM  For on call review [email or web address].

## 2022-05-04 NOTE — Consult Note (Signed)
Palliative Medicine Inpatient Consult Note  Consulting Provider:  Donne Hazel, MD   Reason for consult:   Cassandra Dodson Palliative Medicine Consult  Reason for Consult? Known hx of meningioma. Discussed with Neurosurgery - this is not operable. Also here sick with covid and UTI. Needs Goals of Care, still FULL CODE   05/04/2022  HPI:  Per intake H&P --> 82 y.o. 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 Pt found to be pos for covid. UA suggestive of pyuria and L foot with concerns of heel osteomyelitis.   Palliative has been asked to see Cassandra Dodson and speak to her son related to goals of care in the setting of known meningioma with no resection options.   Clinical Assessment/Goals of Care:  *Please note that this is a verbal dictation therefore any spelling or grammatical errors are due to the "[employer]" system interpretation.  I have reviewed medical records including EPIC notes, labs and imaging, received report from bedside RN, assessed the patient who is somnolent this late afternoon.    I called patients son, Cassandra Dodson to further discuss diagnosis prognosis, GOC, EOL wishes, disposition and options.   I introduced Palliative Medicine as specialized medical care for people living with serious illness. It focuses on providing relief from the symptoms and stress of a serious illness. The goal is to improve quality of life for both the patient and the family.  Medical History Review and Understanding:  Reviewed Cassandra Dodson's history of  HTN, CVA, T2DM, meningioma, endometrial CA, CKD III, and chronic hypoxic respiratory failure.   Social History:  Cassandra Dodson was born in [place] but has lived "all over the world" including [place], [place] and [place], [place].  She is a widow and has one son, Cassandra Dodson. She has a PhD in [place] and psychology.  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 She is a woman of faith and practices within catholicism.   Functional and Nutritional State:  Has been living at [facility] since October and requires assistance for bADLs.  Prior to admission in July Cassandra Dodson lived alone in her loft apartment in downtown [place].  She was ambulatory and independent with her ADLs.  Cassandra Dodson lives nearby and Cassandra Dodson and checked on her daily.   Advance Directives:  A detailed discussion was had today regarding advanced directives.  Patient son is her next of kin and surrogate decision maker.  Code Status:  Concepts specific to code status, artifical feeding and hydration, continued IV antibiotics and rehospitalization was had.  The difference between a aggressive medical intervention path  and a palliative comfort care path for this patient at this time was had.   At this time Cassandra Dodson would like for patient to remain full code, he is open to further conversations moving forward. I expressed my concerns given patients frailty that she may not survive a code or even worse we would cause greater harm and suffering.   Discussion:  I reviewed with Cassandra Dodson present health state. We discussed her overall prognosis in the setting of her meningioma and the concerns related to it's growth in the  setting of no surgical options.   Discussed patients downward trajectory since this summer and how she has recurrently been re-hospitalizaed causing a lose of physical and mental strength.   Cassandra Dodson shares understanding and does desire taking time to come in person to speak with the PMT. He questions  what the future will look like if the patients meningioma is not resectable.   Discussed the importance of continued conversation with family and their  medical providers regarding overall plan of care and treatment options, ensuring decisions are within the context of the patients values and GOCs.  Decision Maker: Cassandra Dodson, Cassandra Dodson (Son): [phone] (Mobile)   SUMMARY OF RECOMMENDATIONS   Full Code/Full Scope of Care --> Ongoing conversations  Family meeting tomorrow at Cassandra Dodson  Ongoing PMT support  Code Status/Advance Care Planning: FULL CODE  Palliative Prophylaxis:  Aspiration, Bowel Regimen, Delirium Protocol, Frequent Pain Assessment, Oral Care, Palliative Wound Care, and Turn Reposition  Additional Recommendations (Limitations, Scope, Preferences): Continue current care  Psycho-social/Spiritual:  Desire for further Chaplaincy support: Not presently Additional Recommendations: Education on acute on chronic disease burden   Prognosis: < 6 Months  Discharge Planning: Discharge plan uncertain presently, patient came from [place]:   05/04/22 1145 05/04/22 1201  BP: (!) 150/76 (!) 156/68  Pulse: (!) 106   Resp: 20 (!) 23  Temp: 97.9 F (36.6 C)   SpO2:      Intake/Output Summary (Last 24 hours) at 05/04/2022 1646 Last data filed at 05/04/2022 0200 Gross per 24 hour  Intake 550 ml  Output 600 ml  Net -50 ml   Last Weight  Most recent update: 05/04/2022 12:01 PM    Weight  76.3 kg (168 lb 3.4 oz)            Gen:  Very frail appearing elderly F in NAD HEENT: Dry mucous membranes CV: Irregular rate and rhythm  PULM:  4LPM Cassandra Dodson ABD: soft/nontender  EXT: No edema  Neuro: Somnolent  PPS: 10%   This conversation/these recommendations were discussed with patient primary care team, Dr. Wyline Copas  Billing based on MDM: High ______________________________________________________ [place] Team Team Cell Phone:  [phone] Please utilize secure chat with additional questions, if there is no response within 30 minutes please call the above phone number  Palliative Medicine Team providers are available by phone from 7am to 7pm daily and can be reached through the team cell phone.  Should this patient require assistance outside of these hours, please call the patient's attending physician.

## 2022-05-04 NOTE — Progress Notes (Signed)
Echo attempted at 1340, patient is tachy between 140 and 160 and experiencing significant O2 desaturations, will re-attempt when heart rate is more controlled.  [place]

## 2022-05-04 NOTE — Consult Note (Signed)
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 Per NSG "over the past year there has beena steady decline in functional status; and she is extremely poor candidate/high risk for surgical intervention."  I agree with primary team that her current presentation is related to cns meningioma and possibly worsened by the covid infection  She has chronic heel ulcer due to PVD, dm2, poor functional status and likely decubitus contribution. This is not playing a role in her presentation but rather a signal of poor prognosis. Regardless of true OM presence in the heel (and imaging is extremely nonspecific), there is not benefit/indication in antibiotics for her unless cellulitis around that area is present. Ongoing wound care/off loading is recommended  And also the pyuria is unlikely of significant and I do not believe she has uti. She has had 4 days of abx and that would be sufficient  This overall is very poor prognosis (very morbid baseline and severe cns process) and I strongly believe palliative/comfort care should be discussed  as primary team had done   Appreciate ortho input, NSG input  Plan: Continue goals of care discussion No sign of active bacterial infection causing sepsis at this time and will stop abx  Will sign off Discussed with primary team   I spent 75 minute reviewing data/chart, and coordinating care and >50% direct face to face time providing counseling/discussing diagnostics/treatment plan with patient   ------------------------------------------------ Principal Problem:   AMS (altered mental status) Active Problems:   Osteomyelitis of left foot (HCC)    HPI: Cassandra Dodson is a 82 y.o. 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 She is stable from covid standpoint at 3 liters o2 supplement  Chest ct no pe and no pna  She has meningioma and ct head showed worsening vasogenic edema with increased midline shift. Dexamethasone as above   She has no fever here. Overall stable  Ua was checked and showed pyuria with a few bacteria which she always had some in prior urine but didn't reflex  She also has chronic left heel ulcer  Abi showed moderate to severe vascular disease  Ortho/nsg evaluated and no intervention offered given morbidity; ortho mention high risk bka.   She  is on empiric bs Abx as well. Bcx negative  Palliative care is involved  Id asked to comment on left heel process in terms of w/u and long term abx    Family History  Problem Relation Age of Onset   Pancreatic cancer Mother    Stroke Father    Hypertension Father    Colon cancer Neg Hx    Breast cancer Neg Hx    Ovarian cancer  Neg Hx    Endometrial cancer Neg Hx    Prostate cancer Neg Hx     Social History   Tobacco Use   Smoking status: Former    Packs/day: 1.50    Years: 45.00    Total pack years: 67.50    Types: Cigarettes    Quit date: 04/22/2002    Years since quitting: 20.0   Smokeless tobacco: Never  Vaping Use   Vaping Use: Never used  Substance Use Topics   Alcohol use: Yes    Comment: rarely   Drug use: No    Allergies  Allergen Reactions   Anesthetics, Amide Nausea And Vomiting    Pt is intolerant to general anesthesia. Pt will throw up and has thrown up during procedure.    Ether Nausea And Vomiting    Review of Systems: ROS All Other ROS was negative, except mentioned above   Past Medical History:  Diagnosis Date   Arthritis    CKD (chronic kidney disease), stage III (HCC)    patient denies   Depression    Diabetes mellitus without complication (HCC)    Difficult intravenous access    Endometrial cancer ([place])    GERD (gastroesophageal reflux disease)    Headache    History of radiation therapy 11/04/2019-12/01/2019   Endometrial HDR; Dr. Gery Pray   Hypertension    Hypothyroidism    Neuromuscular disorder ([place])    neuropathy in feet   Osteoarthritis    PMB (postmenopausal bleeding)    PONV (postoperative nausea and vomiting)    severe nausea and vomiting after knee replacement 06-2014, did ok with 2018 knee replacement   Urinary frequency    Wears glasses        Scheduled Meds:  apixaban  5 mg Oral BID   vitamin C  500 mg Oral Daily   feeding supplement  237 mL Oral BID BM   fenofibrate  160 mg Oral QPC breakfast   folic acid  1 mg Oral Daily   insulin aspart  0-15 Units Subcutaneous TID WC   insulin aspart  0-5 Units Subcutaneous QHS   leptospermum manuka honey  1 Application Topical Daily   liver oil-zinc oxide  1 Application Topical BID   methylphenidate  5 mg Oral BID WC   metoprolol tartrate  25 mg Oral BID   multivitamin with minerals  1 tablet  Oral Daily   pantoprazole  40 mg Oral Daily   rosuvastatin  20 mg Oral QHS   zinc sulfate  220 mg Oral Daily   Continuous Infusions:  ceFEPime (MAXIPIME) IV 2 g (05/04/22 0906)   lactated ringers 75 mL/hr at 05/02/22 0958   metronidazole 500 mg (05/04/22 1157)   vancomycin 1,250 mg (05/04/22 0013)   PRN Meds:.bisacodyl, guaiFENesin-dextromethorphan, Ipratropium-Albuterol, labetalol   OBJECTIVE: Blood pressure (!) 156/68, pulse (!) 106, temperature 97.9 F (36.6 C), temperature source Axillary, resp. rate (!) 23, weight 76.3 kg, SpO2 99 %.  Physical Exam  General/constitutional: chronically ill appearing; unable to cooperate with exam/hx; moaning here and  there during my attempt to examine her HEENT: Normocephalic, PER, Conj Clear, EOMI, Oropharynx clear Neck supple CV: rrr no mrg Lungs: clear to auscultation, normal respiratory effort Abd: Soft, Nontender Ext: no edema  Neuro: not fully able to be examined due to mentation; inability to cooperate; rigid extremities with left wrist flexion contracture and in brace MSK/skin left hand/wrist brace; left heel large unstageable ulcer with eschar; no purulence. Bilateral anterior knee scars; knees without warmth/erthema/effusion   Lab Results Lab Results  Component Value Date   WBC 11.1 (H) 05/04/2022   HGB 9.5 (L) 05/04/2022   HCT 30.0 (L) 05/04/2022   MCV 85.7 05/04/2022   PLT 289 05/04/2022    Lab Results  Component Value Date   CREATININE 0.59 05/04/2022   BUN 18 05/04/2022   NA 139 05/04/2022   K 3.5 05/04/2022   CL 102 05/04/2022   CO2 26 05/04/2022    Lab Results  Component Value Date   ALT 26 05/04/2022   AST 20 05/04/2022   ALKPHOS 50 05/04/2022   BILITOT 0.4 05/04/2022      Microbiology: Recent Results (from the past 240 hour(s))  Resp panel by RT-PCR (RSV, Flu A&B, Covid) Anterior Nasal Swab     Status: Abnormal   Collection Time: 05/01/22  9:07 PM   Specimen: Anterior Nasal Swab  Result Value Ref  Range Status   SARS Coronavirus 2 by RT PCR POSITIVE (A) NEGATIVE Final    Comment: (NOTE) SARS-CoV-2 target nucleic acids are DETECTED.  The SARS-CoV-2 RNA is generally detectable in upper respiratory specimens during the acute phase of infection. Positive results are indicative of the presence of the identified virus, but do not rule out bacterial infection or co-infection with other pathogens not detected by the test. Clinical correlation with patient history and other diagnostic information is necessary to determine patient infection status. The expected result is Negative.  Fact Sheet for Patients: EntrepreneurPulse.com.au  Fact Sheet for Healthcare Providers: IncredibleEmployment.be  This test is not yet approved or cleared by the [place] FDA and  has been authorized for detection and/or diagnosis of SARS-CoV-2 by FDA under an Emergency Use Authorization (EUA).  This EUA will remain in effect (meaning this test can be used) for the duration of  the COVID-19 declaration under Section 564(b)(1) of the A ct, 21 U.S.C. section 360bbb-3(b)(1), unless the authorization is terminated or revoked sooner.     Influenza A by PCR NEGATIVE NEGATIVE Final   Influenza B by PCR NEGATIVE NEGATIVE Final    Comment: (NOTE) The Xpert Xpress SARS-CoV-2/FLU/RSV plus assay is intended as an aid in the diagnosis of influenza from Nasopharyngeal swab specimens and should not be used as a sole basis for treatment. Nasal washings and aspirates are unacceptable for Xpert Xpress SARS-CoV-2/FLU/RSV testing.  Fact Sheet for Patients: EntrepreneurPulse.com.au  Fact Sheet for Healthcare Providers: IncredibleEmployment.be  This test is not yet approved or cleared by the [place] FDA and has been authorized for detection and/or diagnosis of SARS-CoV-2 by FDA under an Emergency Use Authorization (EUA). This EUA will  remain in effect (meaning this test can be used) for the duration of the COVID-19 declaration under Section 564(b)(1) of the Act, 21 U.S.C. section 360bbb-3(b)(1), unless the authorization is terminated or revoked.     Resp Syncytial Virus by PCR NEGATIVE NEGATIVE Final    Comment: (NOTE) Fact Sheet for Patients: EntrepreneurPulse.com.au  Fact Sheet for Healthcare Providers: IncredibleEmployment.be  This test is not yet approved or cleared by the  [place] Architectural technologist and has been authorized for detection and/or diagnosis of SARS-CoV-2 by FDA under an Print production planner (EUA). This EUA will remain in effect (meaning this test can be used) for the duration of the COVID-19 declaration under Section 564(b)(1) of the Act, 21 U.S.C. section 360bbb-3(b)(1), unless the authorization is terminated or revoked.  Performed at [facility], [place] [address] [place], [place] [address]   Culture, blood (Routine X 2) w Reflex to ID Panel     Status: None (Preliminary result)   Collection Time: 05/02/22 12:25 AM   Specimen: BLOOD  Result Value Ref Range Status   Specimen Description BLOOD RIGHT ANTECUBITAL  Final   Special Requests   Final    BOTTLES DRAWN AEROBIC AND ANAEROBIC Blood Culture adequate volume   Culture   Final    NO GROWTH 2 DAYS Performed at [facility], [place] [address] [place], [place] [address]    Report Status PENDING  Incomplete     Serology:    Imaging: If present, new imagings (plain films, ct scans, and mri) have been personally visualized and interpreted; radiology reports have been reviewed. Decision making incorporated into the Impression / Recommendations.  1/12 abi Right: Resting right ankle-brachial index is within normal range. The  right toe-brachial index is abnormal.   Left: Resting left ankle-brachial index indicates moderate left lower  extremity arterial disease.   1/11 ct abd pelv with  contrast and chest angio ABD/pelv: 1. Stool ball in the rectum up to 12 cm in length, suspicious for Fecal Impaction. 2. Distended urinary bladder (520 mL) might be urinary retention secondary to #1. 3. No other acute or inflammatory process identified in the abdomen or pelvis. Chest: 1. No evidence of pulmonary embolism or other acute intrathoracic process. 2. Mild to moderate severity biapical and bibasilar scarring and/or atelectasis. 3. Marked severity coronary artery calcification. 4. Aortic atheroscleros  1/11 ct head 1. 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 2. 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 Remote right pontine infarct. 3. No acute intracranial hemorrhage or infarct.   1/10 xray foot 1. Lytic/sclerotic appearance of the first metatarsal head with hallux valgus deformity, likely a chronic processa. 2. Soft tissue wound about the posterior aspect of the calcaneus. 3. There is mild cortical erosion and periosteal reaction about the posteroinferior aspect of the calcaneus, with adjacent soft tissue wound suspicious for osteomyelitis, further evaluation with MR examination is suggested.     Jabier Mutton, [facility] for [facility] [place] [phone] pager    05/04/2022, 5:22 PM

## 2022-05-05 ENCOUNTER — Inpatient Hospital Stay (HOSPITAL_COMMUNITY): Payer: Medicare Other

## 2022-05-05 DIAGNOSIS — I4891 Unspecified atrial fibrillation: Secondary | ICD-10-CM | POA: Diagnosis not present

## 2022-05-05 DIAGNOSIS — D329 Benign neoplasm of meninges, unspecified: Secondary | ICD-10-CM | POA: Diagnosis not present

## 2022-05-05 DIAGNOSIS — L97429 Non-pressure chronic ulcer of left heel and midfoot with unspecified severity: Secondary | ICD-10-CM

## 2022-05-05 DIAGNOSIS — E13621 Other specified diabetes mellitus with foot ulcer: Secondary | ICD-10-CM

## 2022-05-05 DIAGNOSIS — E11621 Type 2 diabetes mellitus with foot ulcer: Secondary | ICD-10-CM

## 2022-05-05 DIAGNOSIS — U071 COVID-19: Secondary | ICD-10-CM | POA: Diagnosis not present

## 2022-05-05 DIAGNOSIS — G936 Cerebral edema: Secondary | ICD-10-CM

## 2022-05-05 DIAGNOSIS — M869 Osteomyelitis, unspecified: Secondary | ICD-10-CM | POA: Diagnosis not present

## 2022-05-05 DIAGNOSIS — Z515 Encounter for palliative care: Secondary | ICD-10-CM | POA: Diagnosis not present

## 2022-05-05 DIAGNOSIS — Z794 Long term (current) use of insulin: Secondary | ICD-10-CM

## 2022-05-05 DIAGNOSIS — Z7189 Other specified counseling: Secondary | ICD-10-CM

## 2022-05-05 DIAGNOSIS — R4182 Altered mental status, unspecified: Secondary | ICD-10-CM | POA: Diagnosis not present

## 2022-05-05 LAB — CBC
HCT: 31.5 % — ABNORMAL LOW (ref 36.0–46.0)
Hemoglobin: 10.1 g/dL — ABNORMAL LOW (ref 12.0–15.0)
MCH: 27.1 pg (ref 26.0–34.0)
MCHC: 32.1 g/dL (ref 30.0–36.0)
MCV: 84.5 fL (ref 80.0–100.0)
Platelets: 288 10*3/uL (ref 150–400)
RBC: 3.73 MIL/uL — ABNORMAL LOW (ref 3.87–5.11)
RDW: 14.9 % (ref 11.5–15.5)
WBC: 8.4 10*3/uL (ref 4.0–10.5)
nRBC: 0 % (ref 0.0–0.2)

## 2022-05-05 LAB — COMPREHENSIVE METABOLIC PANEL
ALT: 24 U/L (ref 0–44)
AST: 19 U/L (ref 15–41)
Albumin: 2.1 g/dL — ABNORMAL LOW (ref 3.5–5.0)
Alkaline Phosphatase: 62 U/L (ref 38–126)
Anion gap: 7 (ref 5–15)
BUN: 17 mg/dL (ref 8–23)
CO2: 27 mmol/L (ref 22–32)
Calcium: 8.9 mg/dL (ref 8.9–10.3)
Chloride: 104 mmol/L (ref 98–111)
Creatinine, Ser: 0.68 mg/dL (ref 0.44–1.00)
GFR, Estimated: >60 mL/min (ref >60)
Glucose, Bld: 206 mg/dL — ABNORMAL HIGH (ref 70–99)
Potassium: 3.3 mmol/L — ABNORMAL LOW (ref 3.5–5.1)
Sodium: 138 mmol/L (ref 135–145)
Total Bilirubin: 0.5 mg/dL (ref 0.3–1.2)
Total Protein: 5.4 g/dL — ABNORMAL LOW (ref 6.5–8.1)

## 2022-05-05 LAB — ECHOCARDIOGRAM LIMITED
Area-P 1/2: 3.65 cm2
Calc EF: 34.8 %
Single Plane A2C EF: 30.5 %
Single Plane A4C EF: 36.6 %
Weight: 2691.38 oz

## 2022-05-05 LAB — GLUCOSE, CAPILLARY
Glucose-Capillary: 195 mg/dL — ABNORMAL HIGH (ref 70–99)
Glucose-Capillary: 192 mg/dL — ABNORMAL HIGH (ref 70–99)
Glucose-Capillary: 154 mg/dL — ABNORMAL HIGH (ref 70–99)
Glucose-Capillary: 156 mg/dL — ABNORMAL HIGH (ref 70–99)

## 2022-05-05 LAB — D-DIMER, QUANTITATIVE: D-Dimer, Quant: 0.65 ug/mL-FEU — ABNORMAL HIGH (ref 0.00–0.50)

## 2022-05-05 LAB — MAGNESIUM: Magnesium: 1.6 mg/dL — ABNORMAL LOW (ref 1.7–2.4)

## 2022-05-05 LAB — TSH: TSH: 3.71 u[IU]/mL (ref 0.350–4.500)

## 2022-05-05 LAB — C-REACTIVE PROTEIN: CRP: 5.8 mg/dL — ABNORMAL HIGH (ref <1.0)

## 2022-05-05 LAB — FERRITIN: Ferritin: 155 ng/mL (ref 11–307)

## 2022-05-05 MED ORDER — POTASSIUM CHLORIDE CRYS ER 20 MEQ PO TBCR: 40.0000 meq | EXTENDED_RELEASE_TABLET | ORAL | Status: DC

## 2022-05-05 MED ORDER — MAGNESIUM SULFATE 4 GM/100ML IV SOLN
4.0000 g | Freq: Once | INTRAVENOUS | Status: AC
  Administered 2022-05-05: 4 g via INTRAVENOUS
  Filled 2022-05-05: qty 100

## 2022-05-05 MED ORDER — POTASSIUM CHLORIDE CRYS ER 20 MEQ PO TBCR
60.0000 meq | EXTENDED_RELEASE_TABLET | Freq: Once | ORAL | Status: AC
  Administered 2022-05-05: 60 meq via ORAL
  Filled 2022-05-05: qty 3

## 2022-05-05 NOTE — Progress Notes (Signed)
Palliative Medicine Inpatient Follow Up Note HPI: 82 y.o. female with medical history significant for hypertension, chronic hypoxic respiratory failure on 2 L nasal cannula at baseline, CVA with residual left-sided deficits and dysphagia, meningioma followed by neurosurgery, type 2 diabetes, hypothyroidism, endometrial cancer followed by oncology, CKD 3B, chronic anxiety/depression, GERD, history of saddle pulmonary embolism on Eliquis, who presented to [facility] ED from SNF due to concern for shortness of breath and altered mental status. Pt found to be pos for covid. UA suggestive of pyuria and L foot with concerns of heel osteomyelitis.    Palliative has been asked to see Cassandra Dodson and speak to her son related to goals of care in the setting of known meningioma with no resection options.   Today's Discussion 05/05/2022  *Please note that this is a verbal dictation therefore any spelling or grammatical errors are due to the "[place] [employer]" system interpretation.  Chart reviewed inclusive of vital signs, progress notes, laboratory results, and diagnostic images.   I met this morning with patients son, Cassandra Dodson we had a conversation in the setting of Cassandra Dodson's chronic disease burden. We reviewed her PMH of chronic hypoxic respiratory failure on 2 L nasal cannula at baseline, CVAs, meningioma, type 2 diabetes, hypothyroidism, endometrial cancer, CKD 3B, chronic anxiety/depression, GERD, and prior PE.  We discussed Cassandra Dodson's present state in the setting of her COVID-19 infection, osteomyelitis of the left heel, and meningioma.  We reviewed that a change which is taken place which is thrown Cassandra Dodson off is that Cassandra Dodson is no longer a candidate for surgical intervention for her meningioma.  He shares that he had been fighting in order for her to have surgery though now that this is off the table he recognizes that his mom's place on earth in time will be limited.  Created space and opportunity for Cassandra Dodson to explore  thoughts feelings and fears regarding current medical situation.  He shares that he is a former Company secretary so he will keep a very cool and collected front.  He expresses that the way he sees it there are limited options for his mother though is hopeful that she can make it through this COVID-19 infection.  We discussed the different options between skilled nursing care, palliative support, and hospice care as below:  Skilled care is nursing and therapy care that can only be safely and effectively performed by, or under the supervision of, professionals or Metallurgist. It's health care given when you need skilled nursing or skilled therapy to treat, manage, and observe your condition, and evaluate your care. In a skilled nursing facility you'll receive one or more therapies for an average of one to two hours per day. This includes physical, occupational, and speech therapy. The therapies are not considered intensive.   Palliative care is specialized medical care for people living with a serious illness, such as cancer, heart failure, COPD, Alzheimer's dementia, etc. Patients in palliative care may receive medical care for their symptoms, or palliative care, along with treatment intended to cure their serious illness   Hospice care focuses on the care, comfort, and quality of life of a person with a serious illness who is approaching the end of life. At some point, it may not be possible to cure a serious illness, or a patient may choose not to undergo certain treatments. Hospice is designed for this situation.  We reviewed in detail the idea of cardiopulmonary resuscitation.  We discussed the importance of considering the potential trauma that Cassandra Dodson's body  may be caused if she were to endure  this.  Which agrees that DO NOT RESUSCITATE DO NOT INTUBATE are the best plan of care for Cassandra Dodson.   Cassandra Dodson feels strongly that he would like to allow time for outcomes though does recognize Cassandra Dodson will more than  likely evolve into needing hospice care.  He is interested in meeting with [facility] and with Cassandra Dodson transferring to [facility] if hospice is determined to be the next best option.  He would however like to give Cassandra Dodson a few days to see if she can rally from this illness.  Questions and concerns addressed/Palliative Support Provided.   Objective Assessment: Vital Signs Vitals:   05/05/22 0414 05/05/22 0856  BP: (!) 144/73 (!) 168/74  Pulse: 92 100  Resp: 18   Temp: 99 F (37.2 C)   SpO2: 100%     Intake/Output Summary (Last 24 hours) at 05/05/2022 1042 Last data filed at 05/04/2022 2231 Gross per 24 hour  Intake --  Output 1200 ml  Net -1200 ml   Last Weight  Most recent update: 05/04/2022 12:01 PM    Weight  76.3 kg (168 lb 3.4 oz)            Gen:  Very frail appearing elderly F in NAD HEENT: Dry mucous membranes CV: Irregular rate and rhythm  PULM:  4LPM [place] ABD: soft/nontender  EXT: No edema  Neuro: Somnolent  SUMMARY OF RECOMMENDATIONS   DNAR/DNI  Continue to allow time for outcomes  Patient's son is interested in speaking with [facility] to better understand their hospice services  Palliative care will continue to support Tameya during hospitalization  Time Spent: 37 Billing based on MDM: High ______________________________________________________________________________________ [facility] Team Team Cell Phone: [phone] Please utilize secure chat with additional questions, if there is no response within 30 minutes please call the above phone number  Palliative Medicine Team providers are available by phone from 7am to 7pm daily and can be reached through the team cell phone.  Should this patient require assistance outside of these hours, please call the patient's attending physician.

## 2022-05-05 NOTE — Care Management (Signed)
Consult placed by PMT NP stating son is wanting information from [facility] re hospice services. Note indicates that son is interested in [facility] after seeing how the patient does in a few days. Notified by [facility] that [facility] will not be taking admissions until 2/4.   Will need to determine alternate residential hospice once decision to pursue hospice is confirmed.

## 2022-05-05 NOTE — Progress Notes (Signed)
Pharmacy Antibiotic Note  Cassandra Dodson is a 82 y.o. female admitted on 05/01/2022 with sepsis, COVID-19 infection, and oseto of the left foot. She is not a surgical or revascularization candidate at this time and the current recommendation is to treat with long course abx. Hospitalist has connected with ID who will see the patient formally this week. Pharmacy has been consulted for Vancomycin/Cefepime dosing. WBC 8.4, down from 11.2. Scr stable below 0.8. She has been afebrile.   Plan: Continue Vancomycin 1250 mg IV q24h; estimated AUC: 564, IBW, Scr rounded to 08; Vd 0.72 Continue Cefepime 2g IV q12h Flagyl per MD Trend WBC, temp, renal function  Follow up ID recommendations    Temp (24hrs), Avg:98.5 F (36.9 C), Min:97.5 F (36.4 C), Max:99 F (37.2 C)  Recent Labs  Lab 05/01/22 2107 05/01/22 2217 05/02/22 0025 05/02/22 0552 05/02/22 0615 05/03/22 0539 05/04/22 0208 05/05/22 0446  WBC 11.2*  --   --   --  6.0 6.7 11.1* 8.4  CREATININE 0.77  --   --  0.66  --  0.62 0.59 0.68  LATICACIDVEN  --  3.2* 1.3  --   --   --   --   --      Estimated Creatinine Clearance: 53.1 mL/min (by C-G formula based on SCr of 0.68 mg/dL).    Allergies  Allergen Reactions   Anesthetics, Amide Nausea And Vomiting    Pt is intolerant to general anesthesia. Pt will throw up and has thrown up during procedure.    Ether Nausea And Vomiting    Vicenta Dunning, PharmD  PGY1 Pharmacy Resident

## 2022-05-05 NOTE — Progress Notes (Addendum)
Progress Note   Patient: Cassandra Dodson JOA:[id number] DOB: May 24, 1940 DOA: 05/01/2022     3 DOS: the patient was seen and examined on 05/05/2022   Brief hospital course: 82 y.o. 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 Pt found to be pos for covid. UA suggestive of pyuria and L foot with concerns of heel osteomyelitis.  Assessment and Plan: Acute metabolic encephalopathy, suspect multifactorial secondary to sepsis, COVID-19 viral infection. Continue delirium precautions Aspiration precautions Fall precautions Initially lethargic, later more alert, passed dysphagia 2 diet with thin liquids   COVID-19 viral infection, POA COVID-19 screening test positive on 05/01/2022. No significant COVID-19 pneumonitis on CT scan. Started on IV remdesivir, vitamin C, zinc Antitussives as needed CRP initially elevated at 13.8, improved overall to 5.8 Bronchodilators Antitussives as needed Follow inflammatory markers   Acute on chronic hypoxic respiratory failure secondary to COVID-19 viral infection At baseline on 2 L nasal cannula Presented on 4 L [place], currently remains on 4LNC Bronchodilators every 6 hours as needed   Known meningioma CT head without contrast done on 05/02/2022 showed minimally enlarged 5.4 cm planum meningioma with extensive vasogenic edema and slightly progressive 18 mm left-to-right midline shift. Started on IV Decadron per neurosurgery's recommendation. Today discussed with Dr. Ellene Route. Recommendation for no surgery given high risk -Palliative Care  consulted, family meeting noted 1/14. Pt now DNR/DNI. Family willing to meet with hospice to discuss options in the future   Left heel pressure wound/osteomyelitis, POA Concern for possible osteomyelitis, seen on left foot x-ray. Started on IV vancomycin, IV Flagyl, and cefepime in the ED, continue for now Follow blood culture neg thus far Consulted Ortho, appreciate recs. Not candidate for surgery given comorbidities. Ortho recs for wound care with abx. Santyl dressing changes noted per Ortho Appreciate input by ID. Recommendation to stop further antibiotics at this time. Very poor prognosis per ID with recommendation for palliative/comfort measures from ID perspective   History of saddle pulmonary embolism continue home Eliquis   Hyperlipidemia cont home regimen.   Chronic constipation Bowel regimen   Type 2 diabetes with hyperglycemia Continue with SSI as needed  UTI with sepsis present on admission UA suggestive of UTI WBC elevated at 11.2 with encephalopathy Broad spec abx started at time of admit, would cont for now -Of note, pt last noted to have pan-sensitive acinetobacter and ecoli in 11/23 urine and ESBL klebsiella in 10/23 urine  Afib RVR -Likely precipitated by acute illness above -Ordered 2d echo -Have started scheduled metoprolol '25mg'$  bid with PRN labetalol for HR>120 -Back in sinus this AM -TSH within normal limits -Already on eliquis  Hypokalemia -replaced -recheck bmet in AM  Hypomagnesemia -Replaced, recheck level in AM   End of Life -Appreciate input by Palliative Care -Overall poor prognosis -now DNR/DNI -Family to discuss with hospice regarding services offered     Subjective: Very drowsy this AM  Physical Exam: Vitals:   05/04/22 1956 05/05/22 0004 05/05/22 0414 05/05/22 0856  BP: 139/75 135/72 (!) 144/73 (!) 168/74  Pulse: 90 90 92 100  Resp: '20 20 18   '$ Temp: 98.9 F (37.2 C) 99 F (37.2 C) 99  F (37.2 C)   TempSrc: Axillary Axillary  Axillary   SpO2: 100% 100% 100%   Weight:       General exam: Very drowsy, in no acute distress Respiratory system: normal chest rise, clear, no audible wheezing Cardiovascular system: perfused, no notable JVD Gastrointestinal system: Nondistended, nontender Central nervous system: No seizures, no tremors Extremities: No cyanosis, no joint deformities Skin: No rashes, no pallor Psychiatry: Difficult to assess given mentation  Data Reviewed:  Labs reviewed: Na 138, K 3.3, Cr 0.68, CRP 5.8, WBC 8.4, Hgb 10.1  Family Communication: Pt in room, family not at bedside  Disposition: Status is: Inpatient Remains inpatient appropriate because: Severity of illness  Planned Discharge Destination: Skilled nursing facility    Author: Marylu Lund, MD 05/05/2022 5:00 PM  For on call review [email or web address].

## 2022-05-05 NOTE — Progress Notes (Signed)
  Echocardiogram 2D Echocardiogram has been performed.  Wynelle Link 05/05/2022, 4:26 PM

## 2022-05-06 DIAGNOSIS — G936 Cerebral edema: Secondary | ICD-10-CM

## 2022-05-06 DIAGNOSIS — R4182 Altered mental status, unspecified: Secondary | ICD-10-CM | POA: Diagnosis not present

## 2022-05-06 DIAGNOSIS — U071 COVID-19: Secondary | ICD-10-CM | POA: Diagnosis not present

## 2022-05-06 DIAGNOSIS — M869 Osteomyelitis, unspecified: Secondary | ICD-10-CM | POA: Diagnosis not present

## 2022-05-06 LAB — CBC
HCT: 30.6 % — ABNORMAL LOW (ref 36.0–46.0)
Hemoglobin: 9.6 g/dL — ABNORMAL LOW (ref 12.0–15.0)
MCH: 26.8 pg (ref 26.0–34.0)
MCHC: 31.4 g/dL (ref 30.0–36.0)
MCV: 85.5 fL (ref 80.0–100.0)
Platelets: 194 10*3/uL (ref 150–400)
RBC: 3.58 MIL/uL — ABNORMAL LOW (ref 3.87–5.11)
RDW: 15 % (ref 11.5–15.5)
WBC: 8.7 10*3/uL (ref 4.0–10.5)
nRBC: 0 % (ref 0.0–0.2)

## 2022-05-06 LAB — COMPREHENSIVE METABOLIC PANEL
ALT: 20 U/L (ref 0–44)
AST: 17 U/L (ref 15–41)
Albumin: 1.9 g/dL — ABNORMAL LOW (ref 3.5–5.0)
Alkaline Phosphatase: 63 U/L (ref 38–126)
Anion gap: 6 (ref 5–15)
BUN: 15 mg/dL (ref 8–23)
CO2: 26 mmol/L (ref 22–32)
Calcium: 8.3 mg/dL — ABNORMAL LOW (ref 8.9–10.3)
Chloride: 102 mmol/L (ref 98–111)
Creatinine, Ser: 0.59 mg/dL (ref 0.44–1.00)
GFR, Estimated: >60 mL/min (ref >60)
Glucose, Bld: 228 mg/dL — ABNORMAL HIGH (ref 70–99)
Potassium: 3.6 mmol/L (ref 3.5–5.1)
Sodium: 134 mmol/L — ABNORMAL LOW (ref 135–145)
Total Bilirubin: 0.4 mg/dL (ref 0.3–1.2)
Total Protein: 5.3 g/dL — ABNORMAL LOW (ref 6.5–8.1)

## 2022-05-06 LAB — MAGNESIUM: Magnesium: 2 mg/dL (ref 1.7–2.4)

## 2022-05-06 LAB — D-DIMER, QUANTITATIVE: D-Dimer, Quant: 0.82 ug/mL-FEU — ABNORMAL HIGH (ref 0.00–0.50)

## 2022-05-06 LAB — GLUCOSE, CAPILLARY
Glucose-Capillary: 178 mg/dL — ABNORMAL HIGH (ref 70–99)
Glucose-Capillary: 159 mg/dL — ABNORMAL HIGH (ref 70–99)
Glucose-Capillary: 121 mg/dL — ABNORMAL HIGH (ref 70–99)
Glucose-Capillary: 144 mg/dL — ABNORMAL HIGH (ref 70–99)

## 2022-05-06 LAB — C-REACTIVE PROTEIN: CRP: 7.3 mg/dL — ABNORMAL HIGH (ref <1.0)

## 2022-05-06 LAB — FERRITIN: Ferritin: 123 ng/mL (ref 11–307)

## 2022-05-06 MED ORDER — DEXAMETHASONE SODIUM PHOSPHATE 10 MG/ML IJ SOLN
6.0000 mg | INTRAMUSCULAR | Status: DC
  Administered 2022-05-06: 6 mg via INTRAVENOUS
  Filled 2022-05-06: qty 1

## 2022-05-06 MED ORDER — PREDNISONE 5 MG PO TABS: 50.0000 mg | ORAL_TABLET | Freq: Every day | ORAL | Status: DC

## 2022-05-06 MED ORDER — METHYLPREDNISOLONE SODIUM SUCC 125 MG IJ SOLR
1.0000 mg/kg | Freq: Two times a day (BID) | INTRAMUSCULAR | Status: DC
  Administered 2022-05-06: 76.25 mg via INTRAVENOUS
  Filled 2022-05-06: qty 2

## 2022-05-06 NOTE — Progress Notes (Signed)
Progress Note   Patient: Cassandra Dodson NFA:[id number] DOB: 1940-11-14 DOA: 05/01/2022     4 DOS: the patient was seen and examined on 05/06/2022   Brief hospital course: 82 y.o. 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 Pt found to be pos for covid. UA suggestive of pyuria and L foot with concerns of heel osteomyelitis.  Assessment and Plan: Acute metabolic encephalopathy, suspect multifactorial secondary to sepsis, COVID-19 viral infection. Continue delirium precautions Aspiration precautions Fall precautions Initially lethargic, later more alert, passed dysphagia 2 diet with thin liquids   COVID-19 viral infection, POA COVID-19 screening test positive on 05/01/2022. No significant COVID-19 pneumonitis on CT scan. Given  IV remdesivir, vitamin C, zinc Antitussives as needed CRP initially elevated at 13.8, down to 5.4, now trending up to 7.3 Resume decadron Bronchodilators Follow inflammatory markers   Acute on chronic hypoxic respiratory failure secondary to COVID-19 viral infection At baseline on 2 L nasal cannula Presented on 4 L [place], currently remains on 4LNC Bronchodilators every 6 hours as needed   Known meningioma CT head without contrast done on 05/02/2022 showed minimally enlarged 5.4 cm planum meningioma with extensive vasogenic edema and slightly progressive 18 mm left-to-right midline shift. Started on IV Decadron per neurosurgery's recommendation. Per Dr. Ellene Route, recommendation for no surgery given high risk -Palliative Care consulted, family  meeting noted 1/14. Pt now DNR/DNI. Family willing to meet with hospice to discuss options in the future   Left heel pressure wound/osteomyelitis, POA Concern for possible osteomyelitis, seen on left foot x-ray. Started on IV vancomycin, IV Flagyl, and cefepime in the ED, continue for now Follow blood culture neg thus far Consulted Ortho, appreciate recs. Not candidate for surgery given comorbidities. Ortho recs for wound care with abx. Santyl dressing changes noted per Ortho Appreciate input by ID. Recommendation to stop further antibiotics at this time. Very poor prognosis per ID with recommendation for palliative/comfort measures from ID perspective   History of saddle pulmonary embolism continue home Eliquis   Hyperlipidemia cont home regimen.   Chronic constipation Bowel regimen   Type 2 diabetes with hyperglycemia Continue with SSI as needed  UTI with sepsis present on admission UA suggestive of UTI WBC elevated at 11.2 with encephalopathy Broad spec abx started at time of admit, would cont for now -Of note, pt last noted to have pan-sensitive acinetobacter and ecoli in 11/23 urine and ESBL klebsiella in 10/23 urine  Afib RVR -Likely precipitated by acute illness above -Ordered 2d echo -Have started scheduled metoprolol '25mg'$  bid with PRN labetalol for HR>120 -Back in sinus this AM -TSH within normal limits -Already on eliquis  Hypokalemia -replaced -recheck bmet in AM  Hypomagnesemia -Replaced   End of Life -Appreciate input by Palliative Care -Overall poor prognosis -now DNR/DNI -Family to discuss with hospice regarding services offered     Subjective: Remains drowsy this AM  Physical Exam: Vitals:   05/06/22 0530 05/06/22 0900 05/06/22 1000 05/06/22 1027  BP: 122/64 (!) 150/84    Pulse: 79  (!) 101 (!) 114  Resp: 17  (!) 21 17  Temp:  98.9 F (37.2 C)    TempSrc:  Temporal    SpO2: 100%  100% 99%  Weight:       General exam: Asleep laying in bed,  in nad Respiratory system: Normal respiratory effort, no wheezing Cardiovascular system: regular rate, s1, s2 Gastrointestinal system: Soft, nondistended, positive BS Central nervous system: CN2-12 grossly intact, strength intact Extremities: Perfused, no clubbing Skin: Normal skin turgor, no notable skin lesions seen Psychiatry: difficult to assess given mentation  Data Reviewed:  Labs reviewed: Na 134, K 3.6, Cr 0.59, CRP 7.3  Family Communication: Pt in room, family not at bedside  Disposition: Status is: Inpatient Remains inpatient appropriate because: Severity of illness  Planned Discharge Destination: Skilled nursing facility    Author: Marylu Lund, MD 05/06/2022 4:38 PM  For on call review [email or web address].

## 2022-05-06 NOTE — Progress Notes (Signed)
Speech Language Pathology Treatment: Dysphagia  Patient Details Name: Cassandra Dodson MRN: [id number] DOB: [birth year] Today's Date: 05/06/2022 Time: 9935-7017 SLP Time Calculation (min) (ACUTE ONLY): 15 min  Assessment / Plan / Recommendation Clinical Impression  Pt seen for dysphagia safety/efficiency and ability to upgrade. Pt is known to SLP service from prior admissions and was on a regular texture/thin liquids. Today she needed increased stimulation to arouse but once awake was able to maintain and consume regular texture thin liquids. There was mild lingual residue that was cleared with subsequent sips thin. Mastication was timely. She was eager to drink and took sequential sips thin via straw with adequate coordination and appeared to protect her airway well. Recommend she upgrade to Dys 3 and this is an appropriate texture for her to remain on while in hospital given Covid. Once back at SNF it can be upgraded as able. ST will sign off at this time. Would continue to crush meds and check for potential pocketing intermittently during meals and after.    HPI HPI: 82 y.o. 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 Pt found to be pos for covid. UA suggestive of pyuria and L foot with concerns of heel osteomyelitis. MBS 03/06/22 penetration trace that cleared with larger sips thin and nectar; reg/thin recommended.      SLP Plan  All goals met;Discharge SLP treatment due to (comment)      Recommendations for follow up therapy are one component of a multi-disciplinary discharge  planning process, led by the attending physician.  Recommendations may be updated based on patient status, additional functional criteria and insurance authorization.    Recommendations  Diet recommendations: Dysphagia 3 (mechanical soft);Thin liquid Liquids provided via: Straw;Cup Medication Administration: Crushed with puree Supervision: Full supervision/cueing for compensatory strategies;Staff to assist with self feeding Compensations: Slow rate;Small sips/bites;Minimize environmental distractions;Lingual sweep for clearance of pocketing Postural Changes and/or Swallow Maneuvers: Seated upright 90 degrees                Oral Care Recommendations: Oral care BID Follow Up Recommendations: Skilled nursing-short term rehab (<3 hours/day) Assistance recommended at discharge: Frequent or constant Supervision/Assistance SLP Visit Diagnosis: Dysphagia, unspecified (R13.10) Plan: All goals met;Discharge SLP treatment due to (comment)           [name]  05/06/2022, 10:33 AM

## 2022-05-06 NOTE — Progress Notes (Signed)
   Palliative Medicine Inpatient Follow Up Note HPI: 82 y.o. 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 Pt found to be pos for covid. UA suggestive of pyuria and L foot with concerns of heel osteomyelitis.    Palliative has been asked to see Nikolina and speak to her son related to goals of care in the setting of known meningioma with no resection options.   Today's Discussion 05/06/2022  *Please note that this is a verbal dictation therefore any spelling or grammatical errors are due to the "[employer]" system interpretation.  Chart reviewed inclusive of vital signs, progress notes, laboratory results, and diagnostic images.   Went to patient's bedside. No family present. Patient's eyes are open, looking around. She is able to focus on me but no meaningful interaction. Does not follow commands. When I bring straw to her lips she consumes several sips. Small cough afterward. Patient on think liquids following SLP eval today.   Call to son [name]. We review his conversation with palliative provider yesterday. He tells me he understands patient is terminal. He tells me his concerns about how much nutrition she is able to take in. He tells me he would like to continue current treatment in the hospital as his mother recovers from covid but following this he is interested in hospice care. He is interested in facility in hospice home in [place].   Questions and concerns addressed/Palliative Support Provided.   Objective Assessment: Vital Signs Vitals:    05/06/22 1027 05/06/22 1600  BP:    Pulse: (!) 114   Resp: 17   Temp:  99.3 F (37.4 C)  SpO2: 99%     Intake/Output Summary (Last 24 hours) at 05/06/2022 1645 Last data filed at 05/06/2022 0845 Gross per 24 hour  Intake 100 ml  Output 800 ml  Net -700 ml    Last Weight  Most recent update: 05/04/2022 12:01 PM    Weight  76.3 kg (168 lb 3.4 oz)            Gen:  Very frail appearing elderly F in NAD CV: Irregular rate and rhythm  PULM:  4LPM [place] ABD: soft/nontender  EXT: No edema  Neuro: eyes open, unable to follow commands  SUMMARY OF RECOMMENDATIONS   DNAR/DNI  Continue to allow time for outcomes  Patient's son is interested hospice facility in [place] team notified  Palliative care will continue to support Lugene during hospitalization  Juel Burrow, DNP, [facility] Palliative Medicine Team Team Phone # [phone]  Pager # [phone]  Palliative Medicine Team providers are available by phone from 7am to 7pm daily and can be reached through the team cell phone.  Should this patient require assistance outside of these hours, please call the patient's attending physician.

## 2022-05-07 DIAGNOSIS — U071 COVID-19: Secondary | ICD-10-CM | POA: Diagnosis not present

## 2022-05-07 DIAGNOSIS — M869 Osteomyelitis, unspecified: Secondary | ICD-10-CM | POA: Diagnosis not present

## 2022-05-07 DIAGNOSIS — R4182 Altered mental status, unspecified: Secondary | ICD-10-CM | POA: Diagnosis not present

## 2022-05-07 LAB — CBC
HCT: 31.2 % — ABNORMAL LOW (ref 36.0–46.0)
Hemoglobin: 9.6 g/dL — ABNORMAL LOW (ref 12.0–15.0)
MCH: 26.3 pg (ref 26.0–34.0)
MCHC: 30.8 g/dL (ref 30.0–36.0)
MCV: 85.5 fL (ref 80.0–100.0)
Platelets: 236 10*3/uL (ref 150–400)
RBC: 3.65 MIL/uL — ABNORMAL LOW (ref 3.87–5.11)
RDW: 15.1 % (ref 11.5–15.5)
WBC: 7.1 10*3/uL (ref 4.0–10.5)
nRBC: 0 % (ref 0.0–0.2)

## 2022-05-07 LAB — CULTURE, BLOOD (ROUTINE X 2)
Culture: NO GROWTH
Special Requests: ADEQUATE

## 2022-05-07 LAB — COMPREHENSIVE METABOLIC PANEL
ALT: 16 U/L (ref 0–44)
AST: 16 U/L (ref 15–41)
Albumin: 1.8 g/dL — ABNORMAL LOW (ref 3.5–5.0)
Alkaline Phosphatase: 60 U/L (ref 38–126)
Anion gap: 9 (ref 5–15)
BUN: 15 mg/dL (ref 8–23)
CO2: 28 mmol/L (ref 22–32)
Calcium: 8.8 mg/dL — ABNORMAL LOW (ref 8.9–10.3)
Chloride: 99 mmol/L (ref 98–111)
Creatinine, Ser: 0.54 mg/dL (ref 0.44–1.00)
GFR, Estimated: >60 mL/min (ref >60)
Glucose, Bld: 212 mg/dL — ABNORMAL HIGH (ref 70–99)
Potassium: 4 mmol/L (ref 3.5–5.1)
Sodium: 136 mmol/L (ref 135–145)
Total Bilirubin: 0.5 mg/dL (ref 0.3–1.2)
Total Protein: 5.1 g/dL — ABNORMAL LOW (ref 6.5–8.1)

## 2022-05-07 LAB — GLUCOSE, CAPILLARY
Glucose-Capillary: 185 mg/dL — ABNORMAL HIGH (ref 70–99)
Glucose-Capillary: 247 mg/dL — ABNORMAL HIGH (ref 70–99)
Glucose-Capillary: 284 mg/dL — ABNORMAL HIGH (ref 70–99)

## 2022-05-07 LAB — C-REACTIVE PROTEIN: CRP: 7.9 mg/dL — ABNORMAL HIGH (ref <1.0)

## 2022-05-07 LAB — D-DIMER, QUANTITATIVE: D-Dimer, Quant: 0.5 ug/mL-FEU (ref 0.00–0.50)

## 2022-05-07 LAB — FERRITIN: Ferritin: 131 ng/mL (ref 11–307)

## 2022-05-07 MED ORDER — DEXAMETHASONE SODIUM PHOSPHATE 4 MG/ML IJ SOLN
4.0000 mg | Freq: Four times a day (QID) | INTRAMUSCULAR | Status: DC
  Administered 2022-05-07 – 2022-05-08 (×4): 4 mg via INTRAVENOUS
  Filled 2022-05-07 (×4): qty 1

## 2022-05-07 MED ORDER — METOPROLOL TARTRATE 50 MG PO TABS
50.0000 mg | ORAL_TABLET | Freq: Two times a day (BID) | ORAL | Status: DC
  Administered 2022-05-07 – 2022-05-14 (×14): 50 mg via ORAL
  Filled 2022-05-07 (×14): qty 1

## 2022-05-07 NOTE — Consult Note (Signed)
Referring Physician: [name] is an 82 y.o. female.                       Chief Complaint: Atrial fibrillation, paroxysmal  HPI: 82 years old white female with PMH of type 2 DM, HTN, hypothyroidism, HLD, s/p meningioma and paroxysmal atrial fibrillation has sepsis with COVID 19 viral infection. Her LV function is low normal as 50-55 % EF with LVH and not as reported as EF of 35-40 %. She has converted back to sinus rhythm. She is already on Eliquis with h/o pulmonary embolism.  Past Medical History:  Diagnosis Date   Arthritis    CKD (chronic kidney disease), stage III ([place])    patient denies   Depression    Diabetes mellitus without complication ([place])    Difficult intravenous access    Endometrial cancer ([place])    GERD (gastroesophageal reflux disease)    Headache    History of radiation therapy 11/04/2019-12/01/2019   Endometrial HDR; Dr. Gery Pray   Hypertension    Hypothyroidism    Neuromuscular disorder ([place])    neuropathy in feet   Osteoarthritis    PMB (postmenopausal bleeding)    PONV (postoperative nausea and vomiting)    severe nausea and vomiting after knee replacement 06-2014, did ok with 2018 knee replacement   Urinary frequency    Wears glasses       Past Surgical History:  Procedure Laterality Date   BREAST SURGERY     cyst removed   CESAREAN SECTION     DILATION AND CURETTAGE OF UTERUS N/A 06/24/2019   Procedure: DILATATION AND CURETTAGE;  Surgeon: Lafonda Mosses, MD;  Location: [place];  Service: Gynecology;  Laterality: N/A;   FRACTURE SURGERY     right knee   INTRAUTERINE DEVICE (IUD) INSERTION N/A 06/24/2019   Procedure: INTRAUTERINE DEVICE (IUD) INSERTION MIRENA;  Surgeon: Lafonda Mosses, MD;  Location: [facility];  Service: Gynecology;  Laterality: N/A;   IR ANGIO INTRA EXTRACRAN SEL COM CAROTID INNOMINATE UNI R MOD SED  11/05/2021   IR ANGIO VERTEBRAL SEL VERTEBRAL UNI R MOD SED  11/05/2021    IR ANGIOGRAM PULMONARY BILATERAL SELECTIVE  01/28/2022   IR ANGIOGRAM SELECTIVE EACH ADDITIONAL VESSEL  01/28/2022   IR ANGIOGRAM SELECTIVE EACH ADDITIONAL VESSEL  01/28/2022   IR CT HEAD LTD  11/01/2021   IR INTRA CRAN STENT  11/01/2021   IR RADIOLOGIST EVAL & MGMT  12/19/2021   IR THROMBECT PRIM MECH INIT (INCLU) MOD SED  01/28/2022   IR THROMBECT PRIM MECH INIT (INCLU) MOD SED  01/28/2022   IR US GUIDE VASC ACCESS RIGHT  11/01/2021   IR US GUIDE VASC ACCESS RIGHT  01/28/2022   IRRIGATION AND DEBRIDEMENT SEBACEOUS CYST     JOINT REPLACEMENT Right    right   LYMPH NODE DISSECTION N/A 09/14/2019   Procedure: LYMPH NODE DISSECTION;  Surgeon: Lafonda Mosses, MD;  Location: [facility];  Service: Gynecology;  Laterality: N/A;   RADIOLOGY WITH ANESTHESIA N/A 11/01/2021   Procedure: ANGIOPLASTY/STENT;  Surgeon: Luanne Bras, MD;  Location: [place];  Service: Radiology;  Laterality: N/A;   RADIOLOGY WITH ANESTHESIA N/A 01/28/2022   Procedure: IR WITH ANESTHESIA;  Surgeon: Radiologist, Medication, MD;  Location: [place];  Service: Radiology;  Laterality: N/A;   ROBOTIC ASSISTED TOTAL HYSTERECTOMY WITH BILATERAL SALPINGO OOPHERECTOMY Bilateral 09/14/2019   Procedure: XI ROBOTIC ASSISTED TOTAL HYSTERECTOMY WITH BILATERAL SALPINGO  OOPHORECTOMY;  Surgeon: Lafonda Mosses, MD;  Location: [facility];  Service: Gynecology;  Laterality: Bilateral;   SENTINEL NODE BIOPSY N/A 09/14/2019   Procedure: SENTINEL NODE BIOPSY;  Surgeon: Lafonda Mosses, MD;  Location: [facility];  Service: Gynecology;  Laterality: N/A;   teeth extration     TONSILLECTOMY     TOTAL HIP ARTHROPLASTY Right 02/23/2015   Procedure: RIGHT TOTAL HIP ARTHROPLASTY ANTERIOR APPROACH;  Surgeon: Leandrew Koyanagi, MD;  Location: [place];  Service: Orthopedics;  Laterality: Right;   TOTAL KNEE ARTHROPLASTY Left 06/27/2016   Procedure: LEFT TOTAL KNEE ARTHROPLASTY;  Surgeon: Leandrew Koyanagi, MD;  Location: [place];  Service: Orthopedics;  Laterality: Left;     Family History  Problem Relation Age of Onset   Pancreatic cancer Mother    Stroke Father    Hypertension Father    Colon cancer Neg Hx    Breast cancer Neg Hx    Ovarian cancer Neg Hx    Endometrial cancer Neg Hx    Prostate cancer Neg Hx    Social History:  reports that she quit smoking about 20 years ago. Her smoking use included cigarettes. She has a 67.50 pack-year smoking history. She has never used smokeless tobacco. She reports current alcohol use. She reports that she does not use drugs.  Allergies:  Allergies  Allergen Reactions   Anesthetics, Amide Nausea And Vomiting    Pt is intolerant to general anesthesia. Pt will throw up and has thrown up during procedure.    Ether Nausea And Vomiting    Medications Prior to Admission  Medication Sig Dispense Refill   acetaminophen (TYLENOL) 325 MG tablet Take 650 mg by mouth every 6 (six) hours as needed for moderate pain.     Amino Acids-Protein Hydrolys (FEEDING SUPPLEMENT, PRO-STAT SUGAR FREE 64,) LIQD Take 30 mLs by mouth 2 (two) times daily.     antiseptic oral rinse (BIOTENE) LIQD 15 mLs by Mouth Rinse route daily as needed for dry mouth.     apixaban (ELIQUIS) 5 MG TABS tablet Take 1 tablet (5 mg total) by mouth 2 (two) times daily. 60 tablet 3   aspirin EC 81 MG tablet Take 1 tablet (81 mg total) by mouth daily. Swallow whole. 30 tablet 12   benzocaine-menthol (CHLORAEPTIC) 6-10 MG lozenge Take 1 lozenge by mouth every 2 (two) hours as needed for sore throat.     bisacodyl (DULCOLAX) 10 MG suppository Place 10 mg rectally daily as needed (constipation not relieved by Milk of Magnesia).     cephALEXin (KEFLEX) 250 MG capsule Take 250 mg by mouth 3 (three) times daily.     docusate sodium (COLACE) 100 MG capsule Take 1 capsule (100 mg total) by mouth 2 (two) times daily. 60 capsule 0   fenofibrate 160 MG tablet Take 1 tablet (160 mg total) by mouth daily after breakfast. (Patient taking differently: Take 160 mg by mouth  daily.) 30 tablet 0   folic acid (FOLVITE) 1 MG tablet Take 1 tablet (1 mg total) by mouth daily. 30 tablet 0   guaiFENesin 200 MG/10ML LIQD Take 10 mLs by mouth every 4 (four) hours as needed (for cough for 2 days - active order).     insulin glargine (LANTUS) 100 UNIT/ML Solostar Pen Inject 15 Units into the skin daily. (Patient taking differently: Inject 15 Units into the skin at bedtime.) 15 mL 0   insulin lispro (HUMALOG) 100 UNIT/ML KwikPen Inject 3 Units into the skin 3 (three)  times daily with meals. (Patient taking differently: Inject 2-10 Units into the skin 3 (three) times daily. Per sliding scale insulin: 0-150= 0 units, 151-200= 2 units, 201-250= 4 units, 251-300= 6 units, 301-350= 8 units, 351-400= 10 units, 401 or greater, call MD) 15 mL 11   Magnesium Hydroxide (MILK OF MAGNESIA PO) Take 30 mLs by mouth daily as needed (constipation).     methylphenidate (RITALIN) 5 MG tablet Take 1 tablet (5 mg total) by mouth 2 (two) times daily with breakfast and lunch. (Patient taking differently: Take 5 mg by mouth 2 (two) times daily.) 60 tablet 0   Nutritional Supplements (NUTRITIONAL DRINK MIX PO) Take 1 Dose by mouth 3 (three) times daily. Magic Cup     pantoprazole (PROTONIX) 40 MG tablet Take 1 tablet (40 mg total) by mouth daily. (Patient taking differently: Take 40 mg by mouth in the morning.) 30 tablet 0   rosuvastatin (CRESTOR) 20 MG tablet Take 1 tablet (20 mg total) by mouth daily. (Patient taking differently: Take 20 mg by mouth at bedtime.) 30 tablet 0   senna (SENOKOT) 8.6 MG TABS tablet Take 1.5 tablets (12.9 mg total) by mouth at bedtime as needed. (Patient taking differently: Take 12.9 mg by mouth at bedtime as needed (constipation).) 120 tablet 0   Sodium Phosphates (RA SALINE ENEMA RE) Place 1 enema rectally daily as needed (constipation not relieved by bisacodyl suppository).     Zinc Oxide (DESITIN) 13 % CREA Apply 1 application  topically 2 (two) times daily. To buttocks for  protection. (Patient taking differently: Apply 1 application  topically 2 (two) times daily. To buttocks) 113 g 3   zolpidem (AMBIEN) 5 MG tablet Take 2.5 mg by mouth at bedtime as needed for sleep.     Insulin Pen Needle 32G X 4 MM MISC Use daily in the morning, at noon, and at bedtime. 100 each 1    Results for orders placed or performed during the hospital encounter of 05/01/22 (from the past 48 hour(s))  Glucose, capillary     Status: Abnormal   Collection Time: 05/05/22  9:20 PM  Result Value Ref Range   Glucose-Capillary 156 (H) 70 - 99 mg/dL    Comment: Glucose reference range applies only to samples taken after fasting for at least 8 hours.  C-reactive protein     Status: Abnormal   Collection Time: 05/06/22  4:50 AM  Result Value Ref Range   CRP 7.3 (H) <1.0 mg/dL    Comment: Performed at [facility] [address] [place], [place] [address]  D-dimer, quantitative     Status: Abnormal   Collection Time: 05/06/22  4:50 AM  Result Value Ref Range   D-Dimer, Quant 0.82 (H) 0.00 - 0.50 ug/mL-FEU    Comment: (NOTE) At the manufacturer cut-off value of 0.5 g/mL FEU, this assay has a negative predictive value of 95-100%.This assay is intended for use in conjunction with a clinical pretest probability (PTP) assessment model to exclude pulmonary embolism (PE) and deep venous thrombosis (DVT) in outpatients suspected of PE or DVT. Results should be correlated with clinical presentation. Performed at [facility], [place] [address], [place], [place] [address]   Ferritin     Status: None   Collection Time: 05/06/22  4:50 AM  Result Value Ref Range   Ferritin 123 11 - 307 ng/mL    Comment: Performed at [facility], [place] [address] [place], [place] [address]  Comprehensive metabolic panel  Status: Abnormal   Collection Time: 05/06/22  4:50 AM  Result Value Ref Range   Sodium 134 (L) 135 - 145 mmol/L   Potassium 3.6 3.5 - 5.1 mmol/L   Chloride 102 98 - 111  mmol/L   CO2 26 22 - 32 mmol/L   Glucose, Bld 228 (H) 70 - 99 mg/dL    Comment: Glucose reference range applies only to samples taken after fasting for at least 8 hours.   BUN 15 8 - 23 mg/dL   Creatinine, Ser 0.59 0.44 - 1.00 mg/dL   Calcium 8.3 (L) 8.9 - 10.3 mg/dL   Total Protein 5.3 (L) 6.5 - 8.1 g/dL   Albumin 1.9 (L) 3.5 - 5.0 g/dL   AST 17 15 - 41 U/L   ALT 20 0 - 44 U/L   Alkaline Phosphatase 63 38 - 126 U/L   Total Bilirubin 0.4 0.3 - 1.2 mg/dL   GFR, Estimated >60 >60 mL/min    Comment: (NOTE) Calculated using the CKD-EPI Creatinine Equation (2021)    Anion gap 6 5 - 15    Comment: Performed at [facility], [place] [address] [place], [place] [address]  CBC     Status: Abnormal   Collection Time: 05/06/22  4:50 AM  Result Value Ref Range   WBC 8.7 4.0 - 10.5 K/uL   RBC 3.58 (L) 3.87 - 5.11 MIL/uL   Hemoglobin 9.6 (L) 12.0 - 15.0 g/dL   HCT 30.6 (L) 36.0 - 46.0 %   MCV 85.5 80.0 - 100.0 fL   MCH 26.8 26.0 - 34.0 pg   MCHC 31.4 30.0 - 36.0 g/dL   RDW 15.0 11.5 - 15.5 %   Platelets 194 150 - 400 K/uL   nRBC 0.0 0.0 - 0.2 %    Comment: Performed at [facility], [place] [address] [place], [place] [address]  Magnesium     Status: None   Collection Time: 05/06/22  4:50 AM  Result Value Ref Range   Magnesium 2.0 1.7 - 2.4 mg/dL    Comment: Performed at [facility] [address], [place], [place] [address]  Glucose, capillary     Status: Abnormal   Collection Time: 05/06/22  8:23 AM  Result Value Ref Range   Glucose-Capillary 178 (H) 70 - 99 mg/dL    Comment: Glucose reference range applies only to samples taken after fasting for at least 8 hours.  Glucose, capillary     Status: Abnormal   Collection Time: 05/06/22 11:54 AM  Result Value Ref Range   Glucose-Capillary 159 (H) 70 - 99 mg/dL    Comment: Glucose reference range applies only to samples taken after fasting for at least 8 hours.  Glucose, capillary     Status: Abnormal   Collection Time:  05/06/22  3:52 PM  Result Value Ref Range   Glucose-Capillary 121 (H) 70 - 99 mg/dL    Comment: Glucose reference range applies only to samples taken after fasting for at least 8 hours.  Glucose, capillary     Status: Abnormal   Collection Time: 05/06/22  7:51 PM  Result Value Ref Range   Glucose-Capillary 144 (H) 70 - 99 mg/dL    Comment: Glucose reference range applies only to samples taken after fasting for at least 8 hours.  C-reactive protein     Status: Abnormal   Collection Time: 05/07/22  5:34 AM  Result Value Ref Range   CRP 7.9 (H) <1.0 mg/dL  Comment: Performed at [facility], [place] [address], [place], [place] [address]  D-dimer, quantitative     Status: None   Collection Time: 05/07/22  5:34 AM  Result Value Ref Range   D-Dimer, Quant 0.50 0.00 - 0.50 ug/mL-FEU    Comment: (NOTE) At the manufacturer cut-off value of 0.5 g/mL FEU, this assay has a negative predictive value of 95-100%.This assay is intended for use in conjunction with a clinical pretest probability (PTP) assessment model to exclude pulmonary embolism (PE) and deep venous thrombosis (DVT) in outpatients suspected of PE or DVT. Results should be correlated with clinical presentation. Performed at [facility], [place] [address], [place], [place] [address]   Ferritin     Status: None   Collection Time: 05/07/22  5:34 AM  Result Value Ref Range   Ferritin 131 11 - 307 ng/mL    Comment: Performed at [facility], [place] [address], [place], [place] [address]  Comprehensive metabolic panel     Status: Abnormal   Collection Time: 05/07/22  5:34 AM  Result Value Ref Range   Sodium 136 135 - 145 mmol/L   Potassium 4.0 3.5 - 5.1 mmol/L   Chloride 99 98 - 111 mmol/L   CO2 28 22 - 32 mmol/L   Glucose, Bld 212 (H) 70 - 99 mg/dL    Comment: Glucose reference range applies only to samples taken after fasting for at least 8 hours.   BUN 15 8 - 23 mg/dL   Creatinine, Ser 0.54 0.44 - 1.00 mg/dL    Calcium 8.8 (L) 8.9 - 10.3 mg/dL   Total Protein 5.1 (L) 6.5 - 8.1 g/dL   Albumin 1.8 (L) 3.5 - 5.0 g/dL   AST 16 15 - 41 U/L   ALT 16 0 - 44 U/L   Alkaline Phosphatase 60 38 - 126 U/L   Total Bilirubin 0.5 0.3 - 1.2 mg/dL   GFR, Estimated >60 >60 mL/min    Comment: (NOTE) Calculated using the CKD-EPI Creatinine Equation (2021)    Anion gap 9 5 - 15    Comment: Performed at [facility], [place] [address] [place], [place] [address]  CBC     Status: Abnormal   Collection Time: 05/07/22  5:34 AM  Result Value Ref Range   WBC 7.1 4.0 - 10.5 K/uL   RBC 3.65 (L) 3.87 - 5.11 MIL/uL   Hemoglobin 9.6 (L) 12.0 - 15.0 g/dL   HCT 31.2 (L) 36.0 - 46.0 %   MCV 85.5 80.0 - 100.0 fL   MCH 26.3 26.0 - 34.0 pg   MCHC 30.8 30.0 - 36.0 g/dL   RDW 15.1 11.5 - 15.5 %   Platelets 236 150 - 400 K/uL   nRBC 0.0 0.0 - 0.2 %    Comment: Performed at [facility], [place] [address], [place], [place] [address]  Glucose, capillary     Status: Abnormal   Collection Time: 05/07/22  8:08 AM  Result Value Ref Range   Glucose-Capillary 185 (H) 70 - 99 mg/dL    Comment: Glucose reference range applies only to samples taken after fasting for at least 8 hours.  Glucose, capillary     Status: Abnormal   Collection Time: 05/07/22  3:48 PM  Result Value Ref Range   Glucose-Capillary 247 (H) 70 - 99 mg/dL    Comment: Glucose reference range applies only to samples taken after fasting for at least 8 hours.   No  results found.  Review Of Systems As per HPI  Blood pressure 132/60, pulse 77, temperature 98.2 F (36.8 C), temperature source Axillary, resp. rate (!) 21, weight 76.3 kg, SpO2 100 %. Body mass index is 29.8 kg/m. General appearance: alert, cooperative, appears stated age and moderate respiratory distress Head: Normocephalic, atraumatic. Eyes: Blue eyes, Pale pink conjunctiva, corneas clear. Neck: No adenopathy, no carotid bruit, no JVD, supple, symmetrical, trachea midline and thyroid not  enlarged. Resp: Clearing to auscultation bilaterally. Cardio: Irregular rate and rhythm (due frequent frequent APCs), S1, S2 normal, II/VI systolic murmur, no click, rub or gallop GI: Soft, non-tender; bowel sounds normal; no organomegaly. Extremities: 1 + edema all 4 extremities, no cyanosis or clubbing. Skin: Warm and dry.  Neurologic: Alert and oriented X 1.  Assessment/Plan Paroxysmal atrial fibrillation, CHA2DS2VASc of 6 Sepsis with COVID infection Acute on chronic respiratory failure Diastolic left heart failure with LVH HTN HLD CAD Type 2 DM Hypothyroidism S/P PE S/P Meningioma  Increase metoprolol dose to improve heart rate control. Continue Eliquis.  Time spent: Review of old records, Lab, x-rays, EKG, other cardiac tests, examination, discussion with patient/Nurse/Doctor over 70 minutes.  Birdie Riddle, MD  05/07/2022, 6:06 PM

## 2022-05-07 NOTE — Progress Notes (Signed)
Progress Note   Patient: Cassandra Dodson ZOX:[id number] DOB: August 10, 1940 DOA: 05/01/2022     5 DOS: the patient was seen and examined on 05/07/2022   Brief hospital course: 82 y.o. 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 Pt found to be pos for covid. UA suggestive of pyuria and L foot with concerns of heel osteomyelitis.  Assessment and Plan: Acute metabolic encephalopathy, suspect multifactorial secondary to sepsis, COVID-19 viral infection. Continue delirium precautions Aspiration precautions Fall precautions Initially lethargic, now more alert, passed dysphagia 3 diet with thin liquids   COVID-19 viral infection, POA COVID-19 screening test positive on 05/01/2022. No significant COVID-19 pneumonitis on CT scan. Given  IV remdesivir, vitamin C, zinc Antitussives as needed CRP initially elevated at 13.8, down to 5.4, now trending up to 7.9 Was on decadron '6mg'$  daily, but will increase to '4mg'$  q6h per below Bronchodilators Cont to follow inflammatory markers   Acute on chronic hypoxic respiratory failure secondary to COVID-19 viral infection At baseline on 2 L nasal cannula Presented on 4 L , currently remains on 4LNC Bronchodilators every 6 hours as needed   Known meningioma CT head without contrast done on 05/02/2022 showed minimally enlarged 5.4 cm planum meningioma with extensive vasogenic edema and slightly progressive 18 mm left-to-right midline shift. Started on IV Decadron per neurosurgery's recommendation. Per Dr. Ellene Route, recommendation for no  surgery given high risk -Palliative Care consulted, family meeting noted 1/14. Pt now DNR/DNI. Family willing to meet with hospice -Increased decadron from '6mg'$  daily to '4mg'$  q6hrs   Left heel pressure wound/osteomyelitis, POA Concern for possible osteomyelitis, seen on left foot x-ray. Started on IV vancomycin, IV Flagyl, and cefepime in the ED, continue for now Follow blood culture neg thus far Consulted Ortho, appreciate recs. Not candidate for surgery given comorbidities. Ortho recs for wound care with abx. Santyl dressing changes noted per Ortho Appreciate input by ID. Recommendation to stop further antibiotics at this time. Very poor prognosis per ID with recommendation for palliative/comfort measures from ID perspective   History of saddle pulmonary embolism continue home Eliquis   Hyperlipidemia cont home regimen.   Chronic constipation Bowel regimen   Type 2 diabetes with hyperglycemia Continue with SSI as needed  UTI with sepsis present on admission UA suggestive of UTI Broad spec abx given at time of admit, now completed per ID  Afib RVR -Likely precipitated by acute illness above -Reviewed 2d echo. Findings of EF 35-40%, down from 60-65% on 10/23 -Have started scheduled metoprolol '25mg'$  bid with PRN labetalol for HR>120 -Back in sinus this AM -TSH within normal limits -Already on eliquis -Have reached out to Cardiology on call who will see  Hypokalemia -normalized -recheck bmet in AM  Hypomagnesemia -Replaced   End of Life -Appreciate input by Palliative Care -Overall poor prognosis -now DNR/DNI -Family to discuss with hospice regarding services offered     Subjective: Tired this AM, without complaints  Physical Exam: Vitals:   05/07/22 1000 05/07/22 1200 05/07/22 1400 05/07/22 1600  BP: (!) 152/76 (!) 116/59 (!) 125/59 132/60  Pulse: 93 90 81 77  Resp: 17 18 (!) 22 (!) 21  Temp:  TempSrc:      SpO2: 100% 93% 100% 100%  Weight:       General  exam: Asleep but arousable, in no acute distress Respiratory system: normal chest rise, clear, no audible wheezing Cardiovascular system: regular rhythm, s1-s2 Gastrointestinal system: Nondistended, nontender, pos BS Central nervous system: No seizures, no tremors Extremities: No cyanosis, no joint deformities Skin: No rashes, no pallor Psychiatry: mood seems normal, affect seems normal   Data Reviewed:  Labs reviewed: Na 136, K 4.0, Cr 0.54, CRP 7.9  Family Communication: Pt in room, family not at bedside  Disposition: Status is: Inpatient Remains inpatient appropriate because: Severity of illness  Planned Discharge Destination: Skilled nursing facility    Author: Marylu Lund, MD 05/07/2022 5:29 PM  For on call review [email or web address].

## 2022-05-08 DIAGNOSIS — L97409 Non-pressure chronic ulcer of unspecified heel and midfoot with unspecified severity: Secondary | ICD-10-CM

## 2022-05-08 DIAGNOSIS — Z515 Encounter for palliative care: Secondary | ICD-10-CM | POA: Diagnosis not present

## 2022-05-08 DIAGNOSIS — Z7189 Other specified counseling: Secondary | ICD-10-CM | POA: Diagnosis not present

## 2022-05-08 DIAGNOSIS — U071 COVID-19: Secondary | ICD-10-CM | POA: Diagnosis not present

## 2022-05-08 DIAGNOSIS — E11621 Type 2 diabetes mellitus with foot ulcer: Secondary | ICD-10-CM

## 2022-05-08 DIAGNOSIS — R4182 Altered mental status, unspecified: Secondary | ICD-10-CM | POA: Diagnosis not present

## 2022-05-08 DIAGNOSIS — G9341 Metabolic encephalopathy: Secondary | ICD-10-CM | POA: Diagnosis not present

## 2022-05-08 LAB — COMPREHENSIVE METABOLIC PANEL
ALT: 16 U/L (ref 0–44)
AST: 15 U/L (ref 15–41)
Albumin: 1.9 g/dL — ABNORMAL LOW (ref 3.5–5.0)
Alkaline Phosphatase: 61 U/L (ref 38–126)
Anion gap: 11 (ref 5–15)
BUN: 17 mg/dL (ref 8–23)
CO2: 26 mmol/L (ref 22–32)
Calcium: 8.8 mg/dL — ABNORMAL LOW (ref 8.9–10.3)
Chloride: 97 mmol/L — ABNORMAL LOW (ref 98–111)
Creatinine, Ser: 0.53 mg/dL (ref 0.44–1.00)
GFR, Estimated: >60 mL/min (ref >60)
Glucose, Bld: 233 mg/dL — ABNORMAL HIGH (ref 70–99)
Potassium: 3.7 mmol/L (ref 3.5–5.1)
Sodium: 134 mmol/L — ABNORMAL LOW (ref 135–145)
Total Bilirubin: 0.5 mg/dL (ref 0.3–1.2)
Total Protein: 5.4 g/dL — ABNORMAL LOW (ref 6.5–8.1)

## 2022-05-08 LAB — CBC
HCT: 32.5 % — ABNORMAL LOW (ref 36.0–46.0)
Hemoglobin: 10.4 g/dL — ABNORMAL LOW (ref 12.0–15.0)
MCH: 26.9 pg (ref 26.0–34.0)
MCHC: 32 g/dL (ref 30.0–36.0)
MCV: 84.2 fL (ref 80.0–100.0)
Platelets: 248 10*3/uL (ref 150–400)
RBC: 3.86 MIL/uL — ABNORMAL LOW (ref 3.87–5.11)
RDW: 15.2 % (ref 11.5–15.5)
WBC: 6.4 10*3/uL (ref 4.0–10.5)
nRBC: 0 % (ref 0.0–0.2)

## 2022-05-08 LAB — FERRITIN: Ferritin: 134 ng/mL (ref 11–307)

## 2022-05-08 LAB — GLUCOSE, CAPILLARY
Glucose-Capillary: 223 mg/dL — ABNORMAL HIGH (ref 70–99)
Glucose-Capillary: 336 mg/dL — ABNORMAL HIGH (ref 70–99)
Glucose-Capillary: 340 mg/dL — ABNORMAL HIGH (ref 70–99)
Glucose-Capillary: 251 mg/dL — ABNORMAL HIGH (ref 70–99)

## 2022-05-08 LAB — C-REACTIVE PROTEIN: CRP: 4.1 mg/dL — ABNORMAL HIGH (ref <1.0)

## 2022-05-08 MED ORDER — DEXAMETHASONE 4 MG PO TABS
4.0000 mg | ORAL_TABLET | Freq: Four times a day (QID) | ORAL | Status: DC
  Administered 2022-05-08 – 2022-05-09 (×4): 4 mg via ORAL
  Filled 2022-05-08 (×5): qty 1

## 2022-05-08 MED ORDER — SENNOSIDES-DOCUSATE SODIUM 8.6-50 MG PO TABS
2.0000 | ORAL_TABLET | Freq: Every day | ORAL | Status: DC
  Administered 2022-05-08 – 2022-05-13 (×6): 2 via ORAL
  Filled 2022-05-08 (×6): qty 2

## 2022-05-08 MED ORDER — INSULIN GLARGINE-YFGN 100 UNIT/ML ~~LOC~~ SOLN
10.0000 [IU] | Freq: Every day | SUBCUTANEOUS | Status: DC
  Administered 2022-05-08 – 2022-05-09 (×2): 10 [IU] via SUBCUTANEOUS
  Filled 2022-05-08 (×3): qty 0.1

## 2022-05-08 MED ORDER — FLEET ENEMA 7-19 GM/118ML RE ENEM: 1.0000 | ENEMA | Freq: Every day | RECTAL | Status: DC | PRN

## 2022-05-08 MED ORDER — POLYETHYLENE GLYCOL 3350 17 G PO PACK
17.0000 g | PACK | Freq: Every day | ORAL | Status: DC
  Administered 2022-05-08 – 2022-05-14 (×6): 17 g via ORAL
  Filled 2022-05-08 (×6): qty 1

## 2022-05-08 NOTE — Progress Notes (Addendum)
PROGRESS NOTE        PATIENT DETAILS Name: Cassandra Dodson Age: 82 y.o. Sex: female Date of Birth: 09/08/1940 Admit Date: 05/01/2022 Admitting Physician Kayleen Memos, DO IRW:ERXVQ, Jenny Reichmann, MD  Brief Summary: Patient is a 82 y.o.  female with history of chronic hypoxic respiratory failure on 2 L of oxygen, CVA with residual left-sided deficits/dysphagia, meningioma, DM-2, history of PE on Eliquis-who presented with acute metabolic encephalopathy-further workup revealed COVID-19 infection, enlargement of known meningioma with vasogenic edema.  Below for further details.  Significant events: 1/10>> admit to [facility]-acute metabolic encephalopathy-COVID 19 infection.  Significant studies: 1/10>> CXR: No PNA 1/11>> CT head: Minimally enlarged 5.4 meningioma-significant mass effect with vasogenic edema. 1/11>> CT angio chest: No PE.  Mild to moderate biapical/bibasilar scarring 1/11>> CT abdomen/pelvis: Stool ball in the rectum, bladder distention. 1/12>> ABI: Left side-moderate arterial disease. 1/14>> echo: EF 35-40%  Significant microbiology data: 1/10>> COVID PCR: Positive 1/10>> influenza/RSV: Negative 1/11>> blood culture: No growth  Procedures: None  Consults: Neurosurgery Infectious disease Palliative care Cardiology Ortho  Subjective: Lying comfortably in bed-denies any chest pain or shortness of breath.  Objective: Vitals: Blood pressure (!) 163/77, pulse 66, temperature 97.9 F (36.6 C), temperature source Oral, resp. rate 16, weight 76.3 kg, SpO2 100 %.   Exam: Gen Exam:Alert awake-not in any distress HEENT:atraumatic, normocephalic Chest: B/L clear to auscultation anteriorly CVS:S1S2 regular Abdomen:soft non tender, non distended Extremities:++ edema Neurology: Non focal Skin: no rash  Pertinent Labs/Radiology:    Latest Ref Rng & Units 05/08/2022    8:08 AM 05/07/2022    5:34 AM 05/06/2022    4:50 AM  CBC  WBC 4.0 - 10.5 K/uL 6.4   7.1  8.7   Hemoglobin 12.0 - 15.0 g/dL 10.4  9.6  9.6   Hematocrit 36.0 - 46.0 % 32.5  31.2  30.6   Platelets 150 - 400 K/uL 248  236  194     Lab Results  Component Value Date   NA 136 05/07/2022   K 4.0 05/07/2022   CL 99 05/07/2022   CO2 28 05/07/2022      Assessment/Plan: Acute metabolic encephalopathy due to COVID-19 infection Encephalopathy improved-she is much more awake/alert  COVID 19 infection Sepsis Sepsis physiology has resolved No significant parenchymal involvement on CT chest Already on Decadron for meningioma with mass effect  Acute on chronic hypoxic respiratory failure Suspect may have had some bronchitis due to COVID-19 infection causing worsening of hypoxemia Will asked nursing staff to taper down to her usual regimen of 2 L  A-fib RVR Maintaining sinus rhythm Continue beta-blocker/Eliquis  New diagnosis of HFrEF Appears relatively compensated-has chronic lower extremity edema Continue metoprolol Due to advanced age/frailty-do not think she is a candidate for further interventions and initiation of GDMT  Left heel pressure ulcer with osteomyelitis Evaluated by orthopedics/ID-not a surgical candidate-since no overt infection-not felt to require antibiotics.  Meningioma with 18 mm left to right midline shift/extensive vasogenic edema Continue Decadron-will slowly taper down over the next several weeks Agree with neurosurgery-not a candidate for aggressive care including craniotomy/excision given high risk  History of pulmonary embolism Continue Eliquis  DM-2 (A1c 5.0 on 03/02/2022) with uncontrolled hyperglycemia due to steroid use Add 10 units of Semglee-continue SSI  Recent Labs    05/07/22 1548 05/07/22 2203 05/08/22 0832  GLUCAP 247* 284* 223*  HLD Continue statin/fenofibrate  History of prior CVA with left-sided deficits/dysphagia On Eliquis/statin  Constipation MiraLAX/senna Will use Dulcolax/Fleet enema for severe  constipation.  Goals of care DNR in place Palliative care following Plans are for gentle medical treatment-would best benefit from hospice follow-up at SNF  Nutrition Status: Nutrition Problem: Moderate Malnutrition Etiology: chronic illness Signs/Symptoms: moderate muscle depletion, mild fat depletion Interventions: Ensure Enlive (each supplement provides 350kcal and 20 grams of protein), MVI, Magic cup    Pressure Ulcer: Pressure Injury 03/01/22 Heel Left Deep Tissue Pressure Injury - Purple or maroon localized area of discolored intact skin or blood-filled blister due to damage of underlying soft tissue from pressure and/or shear. (Active)  03/01/22 2030  Location: Heel  Location Orientation: Left  Staging: Deep Tissue Pressure Injury - Purple or maroon localized area of discolored intact skin or blood-filled blister due to damage of underlying soft tissue from pressure and/or shear.  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 (Active)  05/02/22 1601  Location: Coccyx  Location Orientation: Left  Staging: Stage 4 - Full thickness tissue loss with exposed bone, tendon or muscle.  Wound Description (Comments):   Present on Admission: Yes  Dressing Type Foam - Lift dressing to assess site every shift 05/06/22 2000    BMI: Estimated body mass index is 29.8 kg/m as calculated from the following:   Height as of 04/10/22: '5\' 3"'$  (1.6 m).   Weight as of this encounter: 76.3 kg.   Code status:   Code Status: DNR   DVT Prophylaxis: apixaban (ELIQUIS) tablet 5 mg     Family Communication: None at bedside   Disposition Plan: Status is: Inpatient Remains inpatient appropriate because: Severity of illness   Planned Discharge Destination:Skilled nursing  facility   Diet: Diet Order             DIET DYS 3 Room service appropriate? No; Fluid consistency: Thin  Diet effective now                     Antimicrobial agents: Anti-infectives (From admission, onward)    Start     Dose/Rate Route Frequency Ordered Stop   05/03/22 1000  remdesivir 100 mg in sodium chloride 0.9 % 100 mL IVPB       See Hyperspace for full Linked Orders Report.   100 mg 200 mL/hr over 30 Minutes Intravenous Daily 05/02/22 0510 05/04/22 1427   05/02/22 2200  vancomycin (VANCOREADY) IVPB 1250 mg/250 mL  Status:  Discontinued        1,250 mg 166.7 mL/hr over 90 Minutes Intravenous Every 24 hours 05/02/22 0542 05/05/22 1433   05/02/22 1000  ceFEPIme (MAXIPIME) 2 g in sodium chloride 0.9 % 100 mL IVPB  Status:  Discontinued        2 g 200 mL/hr over 30 Minutes Intravenous Every 12 hours 05/02/22 0542 05/05/22 1433   05/02/22 1000  metroNIDAZOLE (FLAGYL) IVPB 500 mg  Status:  Discontinued        500 mg 100 mL/hr over 60 Minutes Intravenous Every 12 hours 05/02/22 0542 05/05/22 1433   05/02/22 0700  remdesivir 200 mg in sodium chloride 0.9% 250 mL IVPB       See Hyperspace for full Linked Orders Report.   200 mg  580 mL/hr over 30 Minutes Intravenous Once 05/02/22 0510 05/02/22 0749   05/01/22 2345  ceFEPIme (MAXIPIME) 2 g in sodium chloride 0.9 % 100 mL IVPB        2 g 200 mL/hr over 30 Minutes Intravenous  Once 05/01/22 2339 05/02/22 0101   05/01/22 2345  vancomycin (VANCOREADY) IVPB 1250 mg/250 mL        1,250 mg 166.7 mL/hr over 90 Minutes Intravenous  Once 05/01/22 2339 05/02/22 0422   05/01/22 2330  metroNIDAZOLE (FLAGYL) IVPB 500 mg        500 mg 100 mL/hr over 60 Minutes Intravenous  Once 05/01/22 2317 05/02/22 0219        MEDICATIONS: Scheduled Meds:  apixaban  5 mg Oral BID   vitamin C  500 mg Oral Daily   dexamethasone (DECADRON) injection  4 mg Intravenous Q6H   feeding supplement  237 mL Oral BID BM   fenofibrate  160 mg Oral QPC  breakfast   folic acid  1 mg Oral Daily   insulin aspart  0-15 Units Subcutaneous TID WC   insulin aspart  0-5 Units Subcutaneous QHS   leptospermum manuka honey  1 Application Topical Daily   liver oil-zinc oxide  1 Application Topical BID   methylphenidate  5 mg Oral BID WC   metoprolol tartrate  50 mg Oral BID   multivitamin with minerals  1 tablet Oral Daily   pantoprazole  40 mg Oral Daily   rosuvastatin  20 mg Oral QHS   zinc sulfate  220 mg Oral Daily   Continuous Infusions:  lactated ringers 75 mL/hr at 05/07/22 2348   PRN Meds:.bisacodyl, guaiFENesin-dextromethorphan, Ipratropium-Albuterol, labetalol   I have personally reviewed following labs and imaging studies  LABORATORY DATA: CBC: Recent Labs  Lab 05/01/22 2107 05/02/22 0615 05/04/22 0208 05/05/22 0446 05/06/22 0450 05/07/22 0534 05/08/22 0808  WBC 11.2*   < > 11.1* 8.4 8.7 7.1 6.4  NEUTROABS 9.0*  --   --   --   --   --   --   HGB 10.3*   < > 9.5* 10.1* 9.6* 9.6* 10.4*  HCT 33.5*   < > 30.0* 31.5* 30.6* 31.2* 32.5*  MCV 87.5   < > 85.7 84.5 85.5 85.5 84.2  PLT 325   < > 289 288 194 236 248   < > = values in this interval not displayed.    Basic Metabolic Panel: Recent Labs  Lab 05/02/22 0552 05/03/22 0539 05/04/22 0208 05/05/22 0446 05/06/22 0450 05/07/22 0534  NA 135 137 139 138 134* 136  K 4.0 3.7 3.5 3.3* 3.6 4.0  CL 100 100 102 104 102 99  CO2 '28 25 26 27 26 28  '$ GLUCOSE 245* 198* 150* 206* 228* 212*  BUN '10 16 18 17 15 15  '$ CREATININE 0.66 0.62 0.59 0.68 0.59 0.54  CALCIUM 9.3 9.3 9.1 8.9 8.3* 8.8*  MG 1.6*  --   --  1.6* 2.0  --     GFR: Estimated Creatinine Clearance: 53.1 mL/min (by C-G formula based on SCr of 0.54 mg/dL).  Liver Function Tests: Recent Labs  Lab 05/03/22 0539 05/04/22 0208 05/05/22 0446 05/06/22 0450 05/07/22 0534  AST '24 20 19 17 16  '$ ALT 35 '26 24 20 16  '$ ALKPHOS 63 50 62 63 60  BILITOT 0.5 0.4 0.5 0.4 0.5  PROT 5.8* 5.2* 5.4* 5.3* 5.1*  ALBUMIN 1.9*  1.9* 2.1* 1.9* 1.8*   No results for input(s): "LIPASE", "AMYLASE"  in the last 168 hours. No results for input(s): "AMMONIA" in the last 168 hours.  Coagulation Profile: No results for input(s): "INR", "PROTIME" in the last 168 hours.  Cardiac Enzymes: No results for input(s): "CKTOTAL", "CKMB", "CKMBINDEX", "TROPONINI" in the last 168 hours.  BNP (last 3 results) No results for input(s): "PROBNP" in the last 8760 hours.  Lipid Profile: No results for input(s): "CHOL", "HDL", "LDLCALC", "TRIG", "CHOLHDL", "LDLDIRECT" in the last 72 hours.  Thyroid Function Tests: No results for input(s): "TSH", "T4TOTAL", "FREET4", "T3FREE", "THYROIDAB" in the last 72 hours.  Anemia Panel: Recent Labs    05/06/22 0450 05/07/22 0534  FERRITIN 123 131    Urine analysis:    Component Value Date/Time   COLORURINE YELLOW 05/02/2022 0312   APPEARANCEUR CLOUDY (A) 05/02/2022 0312   LABSPEC 1.015 05/02/2022 0312   PHURINE 6.0 05/02/2022 0312   GLUCOSEU NEGATIVE 05/02/2022 0312   HGBUR MODERATE (A) 05/02/2022 0312   BILIRUBINUR NEGATIVE 05/02/2022 0312   KETONESUR NEGATIVE 05/02/2022 0312   PROTEINUR NEGATIVE 05/02/2022 0312   UROBILINOGEN 0.2 02/13/2015 1305   NITRITE NEGATIVE 05/02/2022 0312   LEUKOCYTESUR LARGE (A) 05/02/2022 0312    Sepsis Labs: Lactic Acid, Venous    Component Value Date/Time   LATICACIDVEN 1.3 05/02/2022 0025    MICROBIOLOGY: Recent Results (from the past 240 hour(s))  Resp panel by RT-PCR (RSV, Flu A&B, Covid) Anterior Nasal Swab     Status: Abnormal   Collection Time: 05/01/22  9:07 PM   Specimen: Anterior Nasal Swab  Result Value Ref Range Status   SARS Coronavirus 2 by RT PCR POSITIVE (A) NEGATIVE Final    Comment: (NOTE) SARS-CoV-2 target nucleic acids are DETECTED.  The SARS-CoV-2 RNA is generally detectable in upper respiratory specimens during the acute phase of infection. Positive results are indicative of the presence of the identified virus, but  do not rule out bacterial infection or co-infection with other pathogens not detected by the test. Clinical correlation with patient history and other diagnostic information is necessary to determine patient infection status. The expected result is Negative.  Fact Sheet for Patients: EntrepreneurPulse.com.au  Fact Sheet for Healthcare Providers: IncredibleEmployment.be  This test is not yet approved or cleared by the [place] FDA and  has been authorized for detection and/or diagnosis of SARS-CoV-2 by FDA under an Emergency Use Authorization (EUA).  This EUA will remain in effect (meaning this test can be used) for the duration of  the COVID-19 declaration under Section 564(b)(1) of the A ct, 21 U.S.C. section 360bbb-3(b)(1), unless the authorization is terminated or revoked sooner.     Influenza A by PCR NEGATIVE NEGATIVE Final   Influenza B by PCR NEGATIVE NEGATIVE Final    Comment: (NOTE) The Xpert Xpress SARS-CoV-2/FLU/RSV plus assay is intended as an aid in the diagnosis of influenza from Nasopharyngeal swab specimens and should not be used as a sole basis for treatment. Nasal washings and aspirates are unacceptable for Xpert Xpress SARS-CoV-2/FLU/RSV testing.  Fact Sheet for Patients: EntrepreneurPulse.com.au  Fact Sheet for Healthcare Providers: IncredibleEmployment.be  This test is not yet approved or cleared by the [place] FDA and has been authorized for detection and/or diagnosis of SARS-CoV-2 by FDA under an Emergency Use Authorization (EUA). This EUA will remain in effect (meaning this test can be used) for the duration of the COVID-19 declaration under Section 564(b)(1) of the Act, 21 U.S.C. section 360bbb-3(b)(1), unless the authorization is terminated or revoked.     Resp Syncytial Virus by PCR  NEGATIVE NEGATIVE Final    Comment: (NOTE) Fact Sheet for  Patients: EntrepreneurPulse.com.au  Fact Sheet for Healthcare Providers: IncredibleEmployment.be  This test is not yet approved or cleared by the [place] FDA and has been authorized for detection and/or diagnosis of SARS-CoV-2 by FDA under an Emergency Use Authorization (EUA). This EUA will remain in effect (meaning this test can be used) for the duration of the COVID-19 declaration under Section 564(b)(1) of the Act, 21 U.S.C. section 360bbb-3(b)(1), unless the authorization is terminated or revoked.  Performed at [facility], [place] [address] [place], [place] [address]   Culture, blood (Routine X 2) w Reflex to ID Panel     Status: None   Collection Time: 05/02/22 12:25 AM   Specimen: BLOOD  Result Value Ref Range Status   Specimen Description BLOOD RIGHT ANTECUBITAL  Final   Special Requests   Final    BOTTLES DRAWN AEROBIC AND ANAEROBIC Blood Culture adequate volume   Culture   Final    NO GROWTH 5 DAYS Performed at [facility], [place] [address], [place], [place] [address]    Report Status 05/07/2022 FINAL  Final    RADIOLOGY STUDIES/RESULTS: No results found.   LOS: 6 days   Oren Binet, MD  [facility]    To contact the attending provider between 7A-7P or the covering provider during after hours 7P-7A, please log into the web site [email or web address] and access using universal [place] password for that web site. If you do not have the password, please call the hospital operator.  05/08/2022, 9:06 AM

## 2022-05-08 NOTE — Progress Notes (Signed)
Manufacturing engineer [facility]) Hospital Liaison Note  Referral received for patient/family interest in home with hospice. Chart under review by [facility] physician.   Hospice eligibility pending.   [place] liaison spoke with patient's son and a follow up discussion will take place tomorrow.   Please call with any questions or concerns. Thank you  Roselee Nova, [place] [facility] Liaison [phone]

## 2022-05-08 NOTE — Progress Notes (Signed)
   Palliative Medicine Inpatient Follow Up Note HPI: 82 y.o. 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 Pt found to be pos for covid. UA suggestive of pyuria and L foot with concerns of heel osteomyelitis.    Palliative has been asked to see Cassandra Dodson and speak to her son related to goals of care in the setting of known meningioma with no resection options.   Today's Discussion 05/08/2022  *Please note that this is a verbal dictation therefore any spelling or grammatical errors are due to the "[place]" system interpretation.  Chart reviewed inclusive of vital signs, progress notes, laboratory results, and diagnostic images.   Went to patient's bedside. No family present. Patient's eyes are open, looking around. She speaks to me for the first time today when I ask her how she feels she states "not too good". She will not answer any other questions despite multiple attempts.   Call to son Cassandra Dodson. He tells me he was able to visit patient yesterday and they had a great talk. He tells me she understands how ill she is and wants to be comfortable. We discuss that patient has likely improved too much to be eligible for hospice facility. We discuss option of her coming home with hospice support. He is agreeable to this. Asks for time to discuss with wife. He would elect [facility] hospice as he would like option of [place] hospice facility in the future.   Questions and concerns addressed/Palliative Support Provided.   Objective Assessment: Vital Signs Vitals:    05/08/22 0940 05/08/22 1130  BP: (!) 145/85   Pulse: 95   Resp:    Temp:  98.3 F (36.8 C)  SpO2:      Intake/Output Summary (Last 24 hours) at 05/08/2022 1338 Last data filed at 05/08/2022 3614 Gross per 24 hour  Intake 3272.83 ml  Output --  Net 3272.83 ml    Last Weight  Most recent update: 05/04/2022 12:01 PM    Weight  76.3 kg (168 lb 3.4 oz)            Gen:  Very frail appearing elderly F in NAD PULM:  [place] ABD: soft/nontender  EXT: No edema  Neuro: eyes open, some verbal interaction but quite limited  SUMMARY OF RECOMMENDATIONS   DNAR/DNI  Son interested in transition home with [facility] hospice services (requests them as he would like option of [place] facility available to him in the future) - TOC notified  Palliative will follow  Juel Burrow, DNP, [facility] Palliative Medicine Team Team Phone # [phone]  Pager # [phone]  Palliative Medicine Team providers are available by phone from 7am to 7pm daily and can be reached through the team cell phone.  Should this patient require assistance outside of these hours, please call the patient's attending physician.

## 2022-05-08 NOTE — Plan of Care (Signed)
  Problem: Coping: Goal: Psychosocial and spiritual needs will be supported Outcome: Progressing   Problem: Respiratory: Goal: Will maintain a patent airway Outcome: Progressing   Problem: Fluid Volume: Goal: Ability to maintain a balanced intake and output will improve Outcome: Progressing   Problem: Metabolic: Goal: Ability to maintain appropriate glucose levels will improve Outcome: Progressing   Problem: Nutritional: Goal: Progress toward achieving an optimal weight will improve Outcome: Progressing

## 2022-05-08 NOTE — TOC Progression Note (Signed)
Transition of Care [facility]) - Progression Note    Patient Details  Name: Cassandra Dodson MRN: [id number] Date of Birth: 11-05-1940  Transition of Care [facility]) CM/SW Contact  Cyndi Bender, RN Phone Number: 05/08/2022, 1:17 PM  Clinical Narrative:        Damaris Schooner to son, [name], regarding transition needs. [name] states he is 80% sure he  plans to take his mother home with hospice but is deciding on patient's apartment vs Son's home.  [name] requesting this RNCM call him tomorrow 05/10/22 at 1200 but is agreeable for this RNCM to reach out to [facility].  Reached out to [place] with [facility] who will follow up with son.      Expected Discharge Plan and Services        Home w/hospice                                        Social Determinants of Health (SDOH) Interventions SDOH Screenings   Food Insecurity: No Food Insecurity (03/02/2022)  Housing: Low Risk  (03/01/2022)  Transportation Needs: No Transportation Needs (03/01/2022)  Utilities: Unknown (03/02/2022)  Tobacco Use: Medium Risk (04/10/2022)    Readmission Risk Interventions     No data to display

## 2022-05-08 NOTE — Plan of Care (Signed)
  Problem: Education: Goal: Knowledge of risk factors and measures for prevention of condition will improve Outcome: Progressing   Problem: Coping: Goal: Psychosocial and spiritual needs will be supported Outcome: Progressing   Problem: Respiratory: Goal: Will maintain a patent airway Outcome: Progressing Goal: Complications related to the disease process, condition or treatment will be avoided or minimized Outcome: Progressing   Problem: Education: Goal: Ability to describe self-care measures that may prevent or decrease complications (Diabetes Survival Skills Education) will improve Outcome: Progressing Goal: Individualized Educational Video(s) Outcome: Progressing   Problem: Coping: Goal: Ability to adjust to condition or change in health will improve Outcome: Progressing   Problem: Fluid Volume: Goal: Ability to maintain a balanced intake and output will improve Outcome: Progressing   Problem: Health Behavior/Discharge Planning: Goal: Ability to identify and utilize available resources and services will improve Outcome: Progressing Goal: Ability to manage health-related needs will improve Outcome: Progressing   Problem: Metabolic: Goal: Ability to maintain appropriate glucose levels will improve Outcome: Progressing   Problem: Nutritional: Goal: Maintenance of adequate nutrition will improve Outcome: Progressing Goal: Progress toward achieving an optimal weight will improve Outcome: Progressing   Problem: Skin Integrity: Goal: Risk for impaired skin integrity will decrease Outcome: Progressing   Problem: Tissue Perfusion: Goal: Adequacy of tissue perfusion will improve Outcome: Progressing   Problem: Education: Goal: Knowledge of General Education information will improve Description: Including pain rating scale, medication(s)/side effects and non-pharmacologic comfort measures Outcome: Progressing   Problem: Health Behavior/Discharge Planning: Goal:  Ability to manage health-related needs will improve Outcome: Progressing   Problem: Clinical Measurements: Goal: Ability to maintain clinical measurements within normal limits will improve Outcome: Progressing Goal: Will remain free from infection Outcome: Progressing Goal: Diagnostic test results will improve Outcome: Progressing Goal: Respiratory complications will improve Outcome: Progressing Goal: Cardiovascular complication will be avoided Outcome: Progressing   Problem: Activity: Goal: Risk for activity intolerance will decrease Outcome: Progressing   Problem: Nutrition: Goal: Adequate nutrition will be maintained Outcome: Progressing   Problem: Coping: Goal: Level of anxiety will decrease Outcome: Progressing   Problem: Elimination: Goal: Will not experience complications related to bowel motility Outcome: Progressing Goal: Will not experience complications related to urinary retention Outcome: Progressing   Problem: Pain Managment: Goal: General experience of comfort will improve Outcome: Progressing   Problem: Safety: Goal: Ability to remain free from injury will improve Outcome: Progressing   Problem: Skin Integrity: Goal: Risk for impaired skin integrity will decrease Outcome: Progressing

## 2022-05-09 DIAGNOSIS — R4182 Altered mental status, unspecified: Secondary | ICD-10-CM | POA: Diagnosis not present

## 2022-05-09 DIAGNOSIS — S31000A Unspecified open wound of lower back and pelvis without penetration into retroperitoneum, initial encounter: Secondary | ICD-10-CM | POA: Diagnosis not present

## 2022-05-09 DIAGNOSIS — U071 COVID-19: Secondary | ICD-10-CM | POA: Diagnosis not present

## 2022-05-09 DIAGNOSIS — M869 Osteomyelitis, unspecified: Secondary | ICD-10-CM | POA: Diagnosis not present

## 2022-05-09 LAB — GLUCOSE, CAPILLARY
Glucose-Capillary: 234 mg/dL — ABNORMAL HIGH (ref 70–99)
Glucose-Capillary: 251 mg/dL — ABNORMAL HIGH (ref 70–99)
Glucose-Capillary: 289 mg/dL — ABNORMAL HIGH (ref 70–99)
Glucose-Capillary: 268 mg/dL — ABNORMAL HIGH (ref 70–99)
Glucose-Capillary: 290 mg/dL — ABNORMAL HIGH (ref 70–99)

## 2022-05-09 MED ORDER — DEXAMETHASONE 4 MG PO TABS
4.0000 mg | ORAL_TABLET | Freq: Three times a day (TID) | ORAL | Status: DC
  Administered 2022-05-09 – 2022-05-11 (×6): 4 mg via ORAL
  Filled 2022-05-09 (×8): qty 1

## 2022-05-09 MED ORDER — ENSURE ENLIVE PO LIQD
237.0000 mL | Freq: Three times a day (TID) | ORAL | Status: DC
  Administered 2022-05-09 – 2022-05-14 (×15): 237 mL via ORAL

## 2022-05-09 NOTE — Progress Notes (Signed)
Manufacturing engineer [facility]) Hospital Liaison Note  [facility] liaison spoke with patient's son [name] to confirm interest in hospice at home. Interest confirmed.   Plan is to discharge to patient's apartment Monday or Tuesday.   No immediate DME needs at this time.   Please send patient home with comfort medications/prescriptions at discharge.   Call with any questions or concerns. Thank you   Roselee Nova, [place] [facility] Liaison [phone]

## 2022-05-09 NOTE — Plan of Care (Signed)
  Problem: Education: Goal: Knowledge of risk factors and measures for prevention of condition will improve Outcome: Progressing   Problem: Coping: Goal: Psychosocial and spiritual needs will be supported Outcome: Progressing   Problem: Respiratory: Goal: Will maintain a patent airway Outcome: Progressing Goal: Complications related to the disease process, condition or treatment will be avoided or minimized Outcome: Progressing   Problem: Education: Goal: Ability to describe self-care measures that may prevent or decrease complications (Diabetes Survival Skills Education) will improve Outcome: Progressing Goal: Individualized Educational Video(s) Outcome: Progressing   Problem: Coping: Goal: Ability to adjust to condition or change in health will improve Outcome: Progressing   Problem: Fluid Volume: Goal: Ability to maintain a balanced intake and output will improve Outcome: Progressing   Problem: Health Behavior/Discharge Planning: Goal: Ability to identify and utilize available resources and services will improve Outcome: Progressing Goal: Ability to manage health-related needs will improve Outcome: Progressing   Problem: Metabolic: Goal: Ability to maintain appropriate glucose levels will improve Outcome: Progressing   Problem: Nutritional: Goal: Maintenance of adequate nutrition will improve Outcome: Progressing Goal: Progress toward achieving an optimal weight will improve Outcome: Progressing   Problem: Skin Integrity: Goal: Risk for impaired skin integrity will decrease Outcome: Progressing   Problem: Tissue Perfusion: Goal: Adequacy of tissue perfusion will improve Outcome: Progressing   Problem: Education: Goal: Knowledge of General Education information will improve Description: Including pain rating scale, medication(s)/side effects and non-pharmacologic comfort measures Outcome: Progressing   Problem: Health Behavior/Discharge Planning: Goal:  Ability to manage health-related needs will improve Outcome: Progressing   Problem: Clinical Measurements: Goal: Ability to maintain clinical measurements within normal limits will improve Outcome: Progressing Goal: Will remain free from infection Outcome: Progressing Goal: Diagnostic test results will improve Outcome: Progressing Goal: Respiratory complications will improve Outcome: Progressing Goal: Cardiovascular complication will be avoided Outcome: Progressing   Problem: Activity: Goal: Risk for activity intolerance will decrease Outcome: Progressing   Problem: Nutrition: Goal: Adequate nutrition will be maintained Outcome: Progressing   Problem: Coping: Goal: Level of anxiety will decrease Outcome: Progressing   Problem: Elimination: Goal: Will not experience complications related to bowel motility Outcome: Progressing Goal: Will not experience complications related to urinary retention Outcome: Progressing   Problem: Pain Managment: Goal: General experience of comfort will improve Outcome: Progressing   Problem: Safety: Goal: Ability to remain free from injury will improve Outcome: Progressing   Problem: Skin Integrity: Goal: Risk for impaired skin integrity will decrease Outcome: Progressing

## 2022-05-09 NOTE — Progress Notes (Signed)
Nutrition Follow-up  DOCUMENTATION CODES:   Non-severe (moderate) malnutrition in context of chronic illness  INTERVENTION:   Continue Multivitamin w/ minerals daily Increase Ensure Enlive po TID, each supplement provides 350 kcal and 20 grams of protein. Encourage good PO intake Recommend obtaining new weight Feeding assist with all meals   NUTRITION DIAGNOSIS:   Moderate Malnutrition related to chronic illness as evidenced by moderate muscle depletion, mild fat depletion - Ongoing  GOAL:   Patient will meet greater than or equal to 90% of their needs - Ongoing  MONITOR:   PO intake, Supplement acceptance, Labs, Skin  REASON FOR ASSESSMENT:   Consult Diet education, Calorie Count  ASSESSMENT:   82 y.o. female presented to the ED with shortness of breath and AS from SNF. 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 Pt admitted with acute metabolic encephalopathy, likely secondary to COVID infection, and acute on chronic hypoxic respiratory failure.   01/10 - Admitted 01/12 - diet advanced to Dysphagia 2, thin liquids 01/15 - diet advanced to Dysphagia 3, thin liquids  Discussed with RN. RN reports that pt is not eating much, fed her some applesauce and banana. Drinking the Ensure's. RN shared that son said this is normal for her to be more awake on some days and lethargic the next.   Meal Intake 1/15-1/18: 0-35% x 4 meals (average 16%)  Discussed in rounds. Ongoing discharge planning, possible home with hospice on Monday or Tuesday.   Medications reviewed and include: Vitamin C, Decadron, Folic acid, NovoLog SSI, Semglee, MVI, Protonix, Miralax, Senokot-S, Zinc sulfate  Labs reviewed: Sodium 134, Potassium 3.7, 24 hr CBGs 234-340  Diet Order:   Diet Order             DIET DYS 3 Room service appropriate? No; Fluid consistency: Thin  Diet effective now                   EDUCATION NEEDS:   Not appropriate for education  at this time  Skin:  Skin Assessment: Skin Integrity Issues: Skin Integrity Issues:: Stage IV Stage IV: coccyx  Last BM:  1/17  Height:  Ht Readings from Last 1 Encounters:  04/10/22 '5\' 3"'$  (1.6 m)   Weight:  Wt Readings from Last 1 Encounters:  05/04/22 76.3 kg   Ideal Body Weight:  52.3 kg  BMI:  Body mass index is 29.8 kg/m.  Estimated Nutritional Needs:  Kcal:  1800-2000 Protein:  90-105 grams Fluid:  >/= 1.8 L   Hermina Barters RD, LDN Clinical Dietitian See [facility] for contact information.

## 2022-05-09 NOTE — Progress Notes (Signed)
PROGRESS NOTE        PATIENT DETAILS Name: Cassandra Dodson Age: 82 y.o. Sex: female Date of Birth: 08-09-1940 Admit Date: 05/01/2022 Admitting Physician Kayleen Memos, DO CWC:BJSEG, Jenny Reichmann, MD  Brief Summary: Patient is a 82 y.o.  female with history of chronic hypoxic respiratory failure on 2 L of oxygen, CVA with residual left-sided deficits/dysphagia, meningioma, DM-2, history of PE on Eliquis-who presented with acute metabolic encephalopathy-further workup revealed COVID-19 infection, enlargement of known meningioma with vasogenic edema.  Below for further details.  Significant events: 1/10>> admit to [facility]-acute metabolic encephalopathy-COVID 19 infection.  Significant studies: 1/10>> CXR: No PNA 1/11>> CT head: Minimally enlarged 5.4 meningioma-significant mass effect with vasogenic edema. 1/11>> CT angio chest: No PE.  Mild to moderate biapical/bibasilar scarring 1/11>> CT abdomen/pelvis: Stool ball in the rectum, bladder distention. 1/12>> ABI: Left side-moderate arterial disease. 1/14>> echo: EF 35-40%  Significant microbiology data: 1/10>> COVID PCR: Positive 1/10>> influenza/RSV: Negative 1/11>> blood culture: No growth  Procedures: None  Consults: Neurosurgery Infectious disease Palliative care Cardiology Ortho  Subjective: Major issues overnight-sleeping comfortably-awakes-answer simple questions appropriately.  Objective: Vitals: Blood pressure 133/67, pulse (!) 101, temperature 98 F (36.7 C), temperature source Axillary, resp. rate (!) 22, weight 76.3 kg, SpO2 100 %.   Exam: Gen Exam:Alert awake-not in any distress HEENT:atraumatic, normocephalic Chest: B/L clear to auscultation anteriorly CVS:S1S2 regular Abdomen:soft non tender, non distended Extremities:no edema Skin: no rash  Pertinent Labs/Radiology:    Latest Ref Rng & Units 05/08/2022    8:08 AM 05/07/2022    5:34 AM 05/06/2022    4:50 AM  CBC  WBC 4.0 - 10.5 K/uL  6.4  7.1  8.7   Hemoglobin 12.0 - 15.0 g/dL 10.4  9.6  9.6   Hematocrit 36.0 - 46.0 % 32.5  31.2  30.6   Platelets 150 - 400 K/uL 248  236  194     Lab Results  Component Value Date   NA 134 (L) 05/08/2022   K 3.7 05/08/2022   CL 97 (L) 05/08/2022   CO2 26 05/08/2022      Assessment/Plan: Acute metabolic encephalopathy due to COVID-19 infection Encephalopathy improved-she is much more awake/alert  COVID 19 infection Sepsis Sepsis physiology has resolved No significant parenchymal involvement on CT chest Already on Decadron for meningioma with mass effect  Acute on chronic hypoxic respiratory failure Suspect may have had some bronchitis due to COVID-19 infection causing worsening of hypoxemia Hypoxia is most Paresh-she is now on room air.  A-fib RVR Maintaining sinus rhythm Continue beta-blocker/Eliquis  New diagnosis of HFrEF Appears relatively compensated-has chronic lower extremity edema Continue metoprolol Due to advanced age/frailty-do not think she is a candidate for further interventions and initiation of GDMT  Left heel pressure ulcer with osteomyelitis Evaluated by orthopedics/ID-not a surgical candidate-since no overt infection-not felt to require antibiotics.  Meningioma with 18 mm left to right midline shift/extensive vasogenic edema Continue Decadron-will slowly taper down over the next several weeks Agree with neurosurgery-not a candidate for aggressive care including craniotomy/excision given high risk  History of pulmonary embolism Continue Eliquis  DM-2 (A1c 5.0 on 03/02/2022) with uncontrolled hyperglycemia due to steroid use CBGs on the higher side-not a candidate for aggressive care Steroids being tapered down Semglee just added on 1/17-will continue for another day before adjusting further.  Recent Labs    05/08/22  1526 05/08/22 2021 05/09/22 0852  GLUCAP 340* 251* 234*     HLD Continue statin/fenofibrate  History of prior CVA with  left-sided deficits/dysphagia On Eliquis/statin  Constipation MiraLAX/senna Will use Dulcolax/Fleet enema for severe constipation.  Goals of care DNR in place Palliative care engaging with family-complains of home with hospice on discharge.  Nutrition Status: Nutrition Problem: Moderate Malnutrition Etiology: chronic illness Signs/Symptoms: moderate muscle depletion, mild fat depletion Interventions: Ensure Enlive (each supplement provides 350kcal and 20 grams of protein), MVI, Magic cup    Pressure Ulcer: Pressure Injury 03/01/22 Heel Left Deep Tissue Pressure Injury - Purple or maroon localized area of discolored intact skin or blood-filled blister due to damage of underlying soft tissue from pressure and/or shear. (Active)  03/01/22 2030  Location: Heel  Location Orientation: Left  Staging: Deep Tissue Pressure Injury - Purple or maroon localized area of discolored intact skin or blood-filled blister due to damage of underlying soft tissue from pressure and/or shear.  Wound Description (Comments):   Present on Admission: Yes  Dressing Type Foam - Lift dressing to assess site every shift;Gauze (Comment);Honey 05/09/22 0500     Pressure Injury 05/02/22 Coccyx Left Stage 4 - Full thickness tissue loss with exposed bone, tendon or muscle. (Active)  05/02/22 1601  Location: Coccyx  Location Orientation: Left  Staging: Stage 4 - Full thickness tissue loss with exposed bone, tendon or muscle.  Wound Description (Comments):   Present on Admission: Yes  Dressing Type Foam - Lift dressing to assess site every shift;Moist to dry 05/09/22 0500    BMI: Estimated body mass index is 29.8 kg/m as calculated from the following:   Height as of 04/10/22: '5\' 3"'$  (1.6 m).   Weight as of this encounter: 76.3 kg.   Code status:   Code Status: DNR   DVT Prophylaxis: apixaban (ELIQUIS) tablet 5 mg     Family Communication: None at bedside-palliative care engaging with  family.   Disposition Plan: Status is: Inpatient Remains inpatient appropriate because: Severity of illness   Planned Discharge Destination: Home with hospice   Diet: Diet Order             DIET DYS 3 Room service appropriate? No; Fluid consistency: Thin  Diet effective now                     Antimicrobial agents: Anti-infectives (From admission, onward)    Start     Dose/Rate Route Frequency Ordered Stop   05/03/22 1000  remdesivir 100 mg in sodium chloride 0.9 % 100 mL IVPB       See Hyperspace for full Linked Orders Report.   100 mg 200 mL/hr over 30 Minutes Intravenous Daily 05/02/22 0510 05/04/22 1427   05/02/22 2200  vancomycin (VANCOREADY) IVPB 1250 mg/250 mL  Status:  Discontinued        1,250 mg 166.7 mL/hr over 90 Minutes Intravenous Every 24 hours 05/02/22 0542 05/05/22 1433   05/02/22 1000  ceFEPIme (MAXIPIME) 2 g in sodium chloride 0.9 % 100 mL IVPB  Status:  Discontinued        2 g 200 mL/hr over 30 Minutes Intravenous Every 12 hours 05/02/22 0542 05/05/22 1433   05/02/22 1000  metroNIDAZOLE (FLAGYL) IVPB 500 mg  Status:  Discontinued        500 mg 100 mL/hr over 60 Minutes Intravenous Every 12 hours 05/02/22 0542 05/05/22 1433   05/02/22 0700  remdesivir 200 mg in sodium chloride 0.9% 250 mL IVPB  See Hyperspace for full Linked Orders Report.   200 mg 580 mL/hr over 30 Minutes Intravenous Once 05/02/22 0510 05/02/22 0749   05/01/22 2345  ceFEPIme (MAXIPIME) 2 g in sodium chloride 0.9 % 100 mL IVPB        2 g 200 mL/hr over 30 Minutes Intravenous  Once 05/01/22 2339 05/02/22 0101   05/01/22 2345  vancomycin (VANCOREADY) IVPB 1250 mg/250 mL        1,250 mg 166.7 mL/hr over 90 Minutes Intravenous  Once 05/01/22 2339 05/02/22 0422   05/01/22 2330  metroNIDAZOLE (FLAGYL) IVPB 500 mg        500 mg 100 mL/hr over 60 Minutes Intravenous  Once 05/01/22 2317 05/02/22 0219        MEDICATIONS: Scheduled Meds:  apixaban  5 mg Oral BID   vitamin C   500 mg Oral Daily   dexamethasone  4 mg Oral Q8H   feeding supplement  237 mL Oral BID BM   fenofibrate  160 mg Oral QPC breakfast   folic acid  1 mg Oral Daily   insulin aspart  0-15 Units Subcutaneous TID WC   insulin aspart  0-5 Units Subcutaneous QHS   insulin glargine-yfgn  10 Units Subcutaneous Daily   leptospermum manuka honey  1 Application Topical Daily   liver oil-zinc oxide  1 Application Topical BID   methylphenidate  5 mg Oral BID WC   metoprolol tartrate  50 mg Oral BID   multivitamin with minerals  1 tablet Oral Daily   pantoprazole  40 mg Oral Daily   polyethylene glycol  17 g Oral Daily   rosuvastatin  20 mg Oral QHS   senna-docusate  2 tablet Oral QHS   zinc sulfate  220 mg Oral Daily   Continuous Infusions:   PRN Meds:.bisacodyl, guaiFENesin-dextromethorphan, Ipratropium-Albuterol, labetalol, sodium phosphate   I have personally reviewed following labs and imaging studies  LABORATORY DATA: CBC: Recent Labs  Lab 05/04/22 0208 05/05/22 0446 05/06/22 0450 05/07/22 0534 05/08/22 0808  WBC 11.1* 8.4 8.7 7.1 6.4  HGB 9.5* 10.1* 9.6* 9.6* 10.4*  HCT 30.0* 31.5* 30.6* 31.2* 32.5*  MCV 85.7 84.5 85.5 85.5 84.2  PLT 289 288 194 236 248     Basic Metabolic Panel: Recent Labs  Lab 05/04/22 0208 05/05/22 0446 05/06/22 0450 05/07/22 0534 05/08/22 0808  NA 139 138 134* 136 134*  K 3.5 3.3* 3.6 4.0 3.7  CL 102 104 102 99 97*  CO2 '26 27 26 28 26  '$ GLUCOSE 150* 206* 228* 212* 233*  BUN '18 17 15 15 17  '$ CREATININE 0.59 0.68 0.59 0.54 0.53  CALCIUM 9.1 8.9 8.3* 8.8* 8.8*  MG  --  1.6* 2.0  --   --      GFR: Estimated Creatinine Clearance: 53.1 mL/min (by C-G formula based on SCr of 0.53 mg/dL).  Liver Function Tests: Recent Labs  Lab 05/04/22 0208 05/05/22 0446 05/06/22 0450 05/07/22 0534 05/08/22 0808  AST '20 19 17 16 15  '$ ALT '26 24 20 16 16  '$ ALKPHOS 50 62 63 60 61  BILITOT 0.4 0.5 0.4 0.5 0.5  PROT 5.2* 5.4* 5.3* 5.1* 5.4*  ALBUMIN 1.9*  2.1* 1.9* 1.8* 1.9*    No results for input(s): "LIPASE", "AMYLASE" in the last 168 hours. No results for input(s): "AMMONIA" in the last 168 hours.  Coagulation Profile: No results for input(s): "INR", "PROTIME" in the last 168 hours.  Cardiac Enzymes: No results for input(s): "CKTOTAL", "CKMB", "CKMBINDEX", "TROPONINI"  in the last 168 hours.  BNP (last 3 results) No results for input(s): "PROBNP" in the last 8760 hours.  Lipid Profile: No results for input(s): "CHOL", "HDL", "LDLCALC", "TRIG", "CHOLHDL", "LDLDIRECT" in the last 72 hours.  Thyroid Function Tests: No results for input(s): "TSH", "T4TOTAL", "FREET4", "T3FREE", "THYROIDAB" in the last 72 hours.  Anemia Panel: Recent Labs    05/07/22 0534 05/08/22 0808  FERRITIN 131 134     Urine analysis:    Component Value Date/Time   COLORURINE YELLOW 05/02/2022 0312   APPEARANCEUR CLOUDY (A) 05/02/2022 0312   LABSPEC 1.015 05/02/2022 0312   PHURINE 6.0 05/02/2022 0312   GLUCOSEU NEGATIVE 05/02/2022 0312   HGBUR MODERATE (A) 05/02/2022 0312   BILIRUBINUR NEGATIVE 05/02/2022 0312   KETONESUR NEGATIVE 05/02/2022 0312   PROTEINUR NEGATIVE 05/02/2022 0312   UROBILINOGEN 0.2 02/13/2015 1305   NITRITE NEGATIVE 05/02/2022 0312   LEUKOCYTESUR LARGE (A) 05/02/2022 0312    Sepsis Labs: Lactic Acid, Venous    Component Value Date/Time   LATICACIDVEN 1.3 05/02/2022 0025    MICROBIOLOGY: Recent Results (from the past 240 hour(s))  Resp panel by RT-PCR (RSV, Flu A&B, Covid) Anterior Nasal Swab     Status: Abnormal   Collection Time: 05/01/22  9:07 PM   Specimen: Anterior Nasal Swab  Result Value Ref Range Status   SARS Coronavirus 2 by RT PCR POSITIVE (A) NEGATIVE Final    Comment: (NOTE) SARS-CoV-2 target nucleic acids are DETECTED.  The SARS-CoV-2 RNA is generally detectable in upper respiratory specimens during the acute phase of infection. Positive results are indicative of the presence of the identified virus,  but do not rule out bacterial infection or co-infection with other pathogens not detected by the test. Clinical correlation with patient history and other diagnostic information is necessary to determine patient infection status. The expected result is Negative.  Fact Sheet for Patients: EntrepreneurPulse.com.au  Fact Sheet for Healthcare Providers: IncredibleEmployment.be  This test is not yet approved or cleared by the [place] FDA and  has been authorized for detection and/or diagnosis of SARS-CoV-2 by FDA under an Emergency Use Authorization (EUA).  This EUA will remain in effect (meaning this test can be used) for the duration of  the COVID-19 declaration under Section 564(b)(1) of the A ct, 21 U.S.C. section 360bbb-3(b)(1), unless the authorization is terminated or revoked sooner.     Influenza A by PCR NEGATIVE NEGATIVE Final   Influenza B by PCR NEGATIVE NEGATIVE Final    Comment: (NOTE) The Xpert Xpress SARS-CoV-2/FLU/RSV plus assay is intended as an aid in the diagnosis of influenza from Nasopharyngeal swab specimens and should not be used as a sole basis for treatment. Nasal washings and aspirates are unacceptable for Xpert Xpress SARS-CoV-2/FLU/RSV testing.  Fact Sheet for Patients: EntrepreneurPulse.com.au  Fact Sheet for Healthcare Providers: IncredibleEmployment.be  This test is not yet approved or cleared by the [place] FDA and has been authorized for detection and/or diagnosis of SARS-CoV-2 by FDA under an Emergency Use Authorization (EUA). This EUA will remain in effect (meaning this test can be used) for the duration of the COVID-19 declaration under Section 564(b)(1) of the Act, 21 U.S.C. section 360bbb-3(b)(1), unless the authorization is terminated or revoked.     Resp Syncytial Virus by PCR NEGATIVE NEGATIVE Final    Comment: (NOTE) Fact Sheet for  Patients: EntrepreneurPulse.com.au  Fact Sheet for Healthcare Providers: IncredibleEmployment.be  This test is not yet approved or cleared by the [place] and has been authorized  for detection and/or diagnosis of SARS-CoV-2 by FDA under an Emergency Use Authorization (EUA). This EUA will remain in effect (meaning this test can be used) for the duration of the COVID-19 declaration under Section 564(b)(1) of the Act, 21 U.S.C. section 360bbb-3(b)(1), unless the authorization is terminated or revoked.  Performed at [facility], [place] [address], [place], [place] [address]   Culture, blood (Routine X 2) w Reflex to ID Panel     Status: None   Collection Time: 05/02/22 12:25 AM   Specimen: BLOOD  Result Value Ref Range Status   Specimen Description BLOOD RIGHT ANTECUBITAL  Final   Special Requests   Final    BOTTLES DRAWN AEROBIC AND ANAEROBIC Blood Culture adequate volume   Culture   Final    NO GROWTH 5 DAYS Performed at [facility], [place] [address], [place], [place] [address]    Report Status 05/07/2022 FINAL  Final    RADIOLOGY STUDIES/RESULTS: No results found.   LOS: 7 days   Oren Binet, MD  [facility]    To contact the attending provider between 7A-7P or the covering provider during after hours 7P-7A, please log into the web site [email or web address] and access using universal [place] password for that web site. If you do not have the password, please call the hospital operator.  05/09/2022, 9:36 AM

## 2022-05-10 DIAGNOSIS — U071 COVID-19: Secondary | ICD-10-CM | POA: Diagnosis not present

## 2022-05-10 DIAGNOSIS — R4182 Altered mental status, unspecified: Secondary | ICD-10-CM | POA: Diagnosis not present

## 2022-05-10 DIAGNOSIS — S31000A Unspecified open wound of lower back and pelvis without penetration into retroperitoneum, initial encounter: Secondary | ICD-10-CM | POA: Diagnosis not present

## 2022-05-10 DIAGNOSIS — M869 Osteomyelitis, unspecified: Secondary | ICD-10-CM | POA: Diagnosis not present

## 2022-05-10 LAB — GLUCOSE, CAPILLARY
Glucose-Capillary: 192 mg/dL — ABNORMAL HIGH (ref 70–99)
Glucose-Capillary: 186 mg/dL — ABNORMAL HIGH (ref 70–99)
Glucose-Capillary: 191 mg/dL — ABNORMAL HIGH (ref 70–99)
Glucose-Capillary: 215 mg/dL — ABNORMAL HIGH (ref 70–99)

## 2022-05-10 MED ORDER — INSULIN GLARGINE-YFGN 100 UNIT/ML ~~LOC~~ SOLN
14.0000 [IU] | Freq: Every day | SUBCUTANEOUS | Status: DC
  Administered 2022-05-10 – 2022-05-14 (×5): 14 [IU] via SUBCUTANEOUS
  Filled 2022-05-10 (×5): qty 0.14

## 2022-05-10 NOTE — Care Management (Signed)
CM received text from [place] stating that someone named Altha Harm had called her and wanted to talk to Case Manager. This CM called the number given- [phone]. No answer and no means to leave a message. Of note- I looked at documents and did not see the name Altha Harm on any of the "release of information" forms- Altha Harm is also not listed as a contact.

## 2022-05-10 NOTE — Progress Notes (Signed)
PROGRESS NOTE        PATIENT DETAILS Name: Cassandra Dodson Age: 82 y.o. Sex: female Date of Birth: [birth year] Admit Date: 05/01/2022 Admitting Physician Kayleen Memos, DO AUQ:[id number], Jenny Reichmann, MD  Brief Summary: Patient is a 82 y.o.  female with history of chronic hypoxic respiratory failure on 2 L of oxygen, CVA with residual left-sided deficits/dysphagia, meningioma, DM-2, history of PE on Eliquis-who presented with acute metabolic encephalopathy-further workup revealed COVID-19 infection, enlargement of known meningioma with vasogenic edema.  Below for further details.  Significant events: 1/10>> admit to [facility]-acute metabolic encephalopathy-COVID 19 infection.  Significant studies: 1/10>> CXR: No PNA 1/11>> CT head: Minimally enlarged 5.4 meningioma-significant mass effect with vasogenic edema. 1/11>> CT angio chest: No PE.  Mild to moderate biapical/bibasilar scarring 1/11>> CT abdomen/pelvis: Stool ball in the rectum, bladder distention. 1/12>> ABI: Left side-moderate arterial disease. 1/14>> echo: EF 35-40%  Significant microbiology data: 1/10>> COVID PCR: Positive 1/10>> influenza/RSV: Negative 1/11>> blood culture: No growth  Procedures: None  Consults: Neurosurgery Infectious disease Palliative care Cardiology Ortho  Subjective: Sleeping comfortably in bed.  No major issues overnight.  Easily awakes-answers simple questions appropriately.  Does not like to be disturbed   Objective: Vitals: Blood pressure (!) 152/86, pulse 95, temperature 98.1 F (36.7 C), temperature source Oral, resp. rate 18, weight 76.3 kg, SpO2 100 %.   Exam: Gen Exam:not in any distress HEENT:atraumatic, normocephalic Chest: B/L clear to auscultation anteriorly CVS:S1S2 regular Abdomen:soft non tender, non distended Extremities:no edema Neurology: Non focal-but with generalized weakness. Skin: no rash  Pertinent Labs/Radiology:    Latest Ref Rng & Units  05/08/2022    8:08 AM 05/07/2022    5:34 AM 05/06/2022    4:50 AM  CBC  WBC 4.0 - 10.5 K/uL 6.4  7.1  8.7   Hemoglobin 12.0 - 15.0 g/dL 10.4  9.6  9.6   Hematocrit 36.0 - 46.0 % 32.5  31.2  30.6   Platelets 150 - 400 K/uL 248  236  194     Lab Results  Component Value Date   NA 134 (L) 05/08/2022   K 3.7 05/08/2022   CL 97 (L) 05/08/2022   CO2 26 05/08/2022      Assessment/Plan: Acute metabolic encephalopathy due to COVID-19 infection Encephalopathy improved-she is much more awake/alert  COVID 19 infection Sepsis Sepsis physiology has resolved No significant parenchymal involvement on CT chest Already on Decadron for meningioma with mass effect  Acute on chronic hypoxic respiratory failure Suspect may have had some bronchitis due to COVID-19 infection causing worsening of hypoxemia Hypoxia is most Paresh-she is now on room air.  A-fib RVR Maintaining sinus rhythm Continue beta-blocker/Eliquis  New diagnosis of HFrEF Appears relatively compensated-has chronic lower extremity edema Continue metoprolol Due to advanced age/frailty-do not think she is a candidate for further interventions and initiation of GDMT  Left heel pressure ulcer with osteomyelitis Evaluated by orthopedics/ID-not a surgical candidate-since no overt infection-not felt to require antibiotics.  Meningioma with 18 mm left to right midline shift/extensive vasogenic edema Continue Decadron-will slowly taper down over the next several weeks Agree with neurosurgery-not a candidate for aggressive care including craniotomy/excision given high risk  History of pulmonary embolism Continue Eliquis  DM-2 (A1c 5.0 on 03/02/2022) with uncontrolled hyperglycemia due to steroid use CBGs on the higher side-not a candidate for aggressive care Steroids being tapered  down Increase Semglee to 14 units-continue SSI   Recent Labs    05/09/22 2129 05/09/22 2252 05/10/22 0826  GLUCAP 268* 290* 192*      HLD Continue statin/fenofibrate  History of prior CVA with left-sided deficits/dysphagia On Eliquis/statin  Constipation MiraLAX/senna Will use Dulcolax/Fleet enema for severe constipation.  Goals of care DNR in place Palliative care following This MD spoke with son on 1/18-plan is for home hospice-family needs several days to arrange space/clean apartment.  Likely discharge early next week with home hospice.  Nutrition Status: Nutrition Problem: Moderate Malnutrition Etiology: chronic illness Signs/Symptoms: moderate muscle depletion, mild fat depletion Interventions: Ensure Enlive (each supplement provides 350kcal and 20 grams of protein), MVI, Magic cup    Pressure Ulcer: Pressure Injury 03/01/22 Heel Left Deep Tissue Pressure Injury - Purple or maroon localized area of discolored intact skin or blood-filled blister due to damage of underlying soft tissue from pressure and/or shear. (Active)  03/01/22 2030  Location: Heel  Location Orientation: Left  Staging: Deep Tissue Pressure Injury - Purple or maroon localized area of discolored intact skin or blood-filled blister due to damage of underlying soft tissue from pressure and/or shear.  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 (Active)  05/02/22 1601  Location: Coccyx  Location Orientation: Left  Staging: Stage 4 - Full thickness tissue loss with exposed bone, tendon or muscle.  Wound Description (Comments):   Present on Admission: Yes  Dressing Type Foam - Lift dressing to assess site every shift 05/09/22 1945    BMI: Estimated body mass index is 29.8 kg/m as calculated from the following:   Height as of 04/10/22: '5\' 3"'$  (1.6 m).   Weight as of this encounter: 76.3 kg.   Code status:   Code Status: DNR    DVT Prophylaxis: apixaban (ELIQUIS) tablet 5 mg     Family Communication: None at bedside-palliative care engaging with family.   Disposition Plan: Status is: Inpatient Remains inpatient appropriate because: Severity of illness   Planned Discharge Destination: Home with hospice   Diet: Diet Order             DIET DYS 3 Room service appropriate? No; Fluid consistency: Thin  Diet effective now                     Antimicrobial agents: Anti-infectives (From admission, onward)    Start     Dose/Rate Route Frequency Ordered Stop   05/03/22 1000  remdesivir 100 mg in sodium chloride 0.9 % 100 mL IVPB       See Hyperspace for full Linked Orders Report.   100 mg 200 mL/hr over 30 Minutes Intravenous Daily 05/02/22 0510 05/04/22 1427   05/02/22 2200  vancomycin (VANCOREADY) IVPB 1250 mg/250 mL  Status:  Discontinued        1,250 mg 166.7 mL/hr over 90 Minutes Intravenous Every 24 hours 05/02/22 0542 05/05/22 1433   05/02/22 1000  ceFEPIme (MAXIPIME) 2 g in sodium chloride 0.9 % 100 mL IVPB  Status:  Discontinued        2 g 200 mL/hr over 30 Minutes Intravenous Every 12 hours 05/02/22 0542 05/05/22 1433   05/02/22 1000  metroNIDAZOLE (FLAGYL) IVPB 500 mg  Status:  Discontinued  500 mg 100 mL/hr over 60 Minutes Intravenous Every 12 hours 05/02/22 0542 05/05/22 1433   05/02/22 0700  remdesivir 200 mg in sodium chloride 0.9% 250 mL IVPB       See Hyperspace for full Linked Orders Report.   200 mg 580 mL/hr over 30 Minutes Intravenous Once 05/02/22 0510 05/02/22 0749   05/01/22 2345  ceFEPIme (MAXIPIME) 2 g in sodium chloride 0.9 % 100 mL IVPB        2 g 200 mL/hr over 30 Minutes Intravenous  Once 05/01/22 2339 05/02/22 0101   05/01/22 2345  vancomycin (VANCOREADY) IVPB 1250 mg/250 mL        1,250 mg 166.7 mL/hr over 90 Minutes Intravenous  Once 05/01/22 2339 05/02/22 0422   05/01/22 2330  metroNIDAZOLE (FLAGYL) IVPB 500 mg        500 mg 100 mL/hr over 60 Minutes  Intravenous  Once 05/01/22 2317 05/02/22 0219        MEDICATIONS: Scheduled Meds:  apixaban  5 mg Oral BID   vitamin C  500 mg Oral Daily   dexamethasone  4 mg Oral Q8H   feeding supplement  237 mL Oral TID BM   fenofibrate  160 mg Oral QPC breakfast   folic acid  1 mg Oral Daily   insulin aspart  0-15 Units Subcutaneous TID WC   insulin aspart  0-5 Units Subcutaneous QHS   insulin glargine-yfgn  14 Units Subcutaneous Daily   leptospermum manuka honey  1 Application Topical Daily   liver oil-zinc oxide  1 Application Topical BID   methylphenidate  5 mg Oral BID WC   metoprolol tartrate  50 mg Oral BID   multivitamin with minerals  1 tablet Oral Daily   pantoprazole  40 mg Oral Daily   polyethylene glycol  17 g Oral Daily   rosuvastatin  20 mg Oral QHS   senna-docusate  2 tablet Oral QHS   zinc sulfate  220 mg Oral Daily   Continuous Infusions:   PRN Meds:.bisacodyl, guaiFENesin-dextromethorphan, Ipratropium-Albuterol, labetalol, sodium phosphate   I have personally reviewed following labs and imaging studies  LABORATORY DATA: CBC: Recent Labs  Lab 05/04/22 0208 05/05/22 0446 05/06/22 0450 05/07/22 0534 05/08/22 0808  WBC 11.1* 8.4 8.7 7.1 6.4  HGB 9.5* 10.1* 9.6* 9.6* 10.4*  HCT 30.0* 31.5* 30.6* 31.2* 32.5*  MCV 85.7 84.5 85.5 85.5 84.2  PLT 289 288 194 236 248     Basic Metabolic Panel: Recent Labs  Lab 05/04/22 0208 05/05/22 0446 05/06/22 0450 05/07/22 0534 05/08/22 0808  NA 139 138 134* 136 134*  K 3.5 3.3* 3.6 4.0 3.7  CL 102 104 102 99 97*  CO2 '26 27 26 28 26  '$ GLUCOSE 150* 206* 228* 212* 233*  BUN '18 17 15 15 17  '$ CREATININE 0.59 0.68 0.59 0.54 0.53  CALCIUM 9.1 8.9 8.3* 8.8* 8.8*  MG  --  1.6* 2.0  --   --      GFR: Estimated Creatinine Clearance: 53.1 mL/min (by C-G formula based on SCr of 0.53 mg/dL).  Liver Function Tests: Recent Labs  Lab 05/04/22 0208 05/05/22 0446 05/06/22 0450 05/07/22 0534 05/08/22 0808  AST '20 19 17 16  15  '$ ALT '26 24 20 16 16  '$ ALKPHOS 50 62 63 60 61  BILITOT 0.4 0.5 0.4 0.5 0.5  PROT 5.2* 5.4* 5.3* 5.1* 5.4*  ALBUMIN 1.9* 2.1* 1.9* 1.8* 1.9*    No results for input(s): "LIPASE", "AMYLASE" in the last 168  hours. No results for input(s): "AMMONIA" in the last 168 hours.  Coagulation Profile: No results for input(s): "INR", "PROTIME" in the last 168 hours.  Cardiac Enzymes: No results for input(s): "CKTOTAL", "CKMB", "CKMBINDEX", "TROPONINI" in the last 168 hours.  BNP (last 3 results) No results for input(s): "PROBNP" in the last 8760 hours.  Lipid Profile: No results for input(s): "CHOL", "HDL", "LDLCALC", "TRIG", "CHOLHDL", "LDLDIRECT" in the last 72 hours.  Thyroid Function Tests: No results for input(s): "TSH", "T4TOTAL", "FREET4", "T3FREE", "THYROIDAB" in the last 72 hours.  Anemia Panel: Recent Labs    05/08/22 0808  FERRITIN 134     Urine analysis:    Component Value Date/Time   COLORURINE YELLOW 05/02/2022 0312   APPEARANCEUR CLOUDY (A) 05/02/2022 0312   LABSPEC 1.015 05/02/2022 0312   PHURINE 6.0 05/02/2022 0312   GLUCOSEU NEGATIVE 05/02/2022 0312   HGBUR MODERATE (A) 05/02/2022 0312   BILIRUBINUR NEGATIVE 05/02/2022 0312   KETONESUR NEGATIVE 05/02/2022 0312   PROTEINUR NEGATIVE 05/02/2022 0312   UROBILINOGEN 0.2 02/13/2015 1305   NITRITE NEGATIVE 05/02/2022 0312   LEUKOCYTESUR LARGE (A) 05/02/2022 0312    Sepsis Labs: Lactic Acid, Venous    Component Value Date/Time   LATICACIDVEN 1.3 05/02/2022 0025    MICROBIOLOGY: Recent Results (from the past 240 hour(s))  Resp panel by RT-PCR (RSV, Flu A&B, Covid) Anterior Nasal Swab     Status: Abnormal   Collection Time: 05/01/22  9:07 PM   Specimen: Anterior Nasal Swab  Result Value Ref Range Status   SARS Coronavirus 2 by RT PCR POSITIVE (A) NEGATIVE Final    Comment: (NOTE) SARS-CoV-2 target nucleic acids are DETECTED.  The SARS-CoV-2 RNA is generally detectable in upper respiratory specimens  during the acute phase of infection. Positive results are indicative of the presence of the identified virus, but do not rule out bacterial infection or co-infection with other pathogens not detected by the test. Clinical correlation with patient history and other diagnostic information is necessary to determine patient infection status. The expected result is Negative.  Fact Sheet for Patients: EntrepreneurPulse.com.au  Fact Sheet for Healthcare Providers: IncredibleEmployment.be  This test is not yet approved or cleared by the [place] FDA and  has been authorized for detection and/or diagnosis of SARS-CoV-2 by FDA under an Emergency Use Authorization (EUA).  This EUA will remain in effect (meaning this test can be used) for the duration of  the COVID-19 declaration under Section 564(b)(1) of the A ct, 21 U.S.C. section 360bbb-3(b)(1), unless the authorization is terminated or revoked sooner.     Influenza A by PCR NEGATIVE NEGATIVE Final   Influenza B by PCR NEGATIVE NEGATIVE Final    Comment: (NOTE) The Xpert Xpress SARS-CoV-2/FLU/RSV plus assay is intended as an aid in the diagnosis of influenza from Nasopharyngeal swab specimens and should not be used as a sole basis for treatment. Nasal washings and aspirates are unacceptable for Xpert Xpress SARS-CoV-2/FLU/RSV testing.  Fact Sheet for Patients: EntrepreneurPulse.com.au  Fact Sheet for Healthcare Providers: IncredibleEmployment.be  This test is not yet approved or cleared by the [place] FDA and has been authorized for detection and/or diagnosis of SARS-CoV-2 by FDA under an Emergency Use Authorization (EUA). This EUA will remain in effect (meaning this test can be used) for the duration of the COVID-19 declaration under Section 564(b)(1) of the Act, 21 U.S.C. section 360bbb-3(b)(1), unless the authorization is terminated or revoked.      Resp Syncytial Virus by PCR NEGATIVE NEGATIVE Final  Comment: (NOTE) Fact Sheet for Patients: EntrepreneurPulse.com.au  Fact Sheet for Healthcare Providers: IncredibleEmployment.be  This test is not yet approved or cleared by the [place] FDA and has been authorized for detection and/or diagnosis of SARS-CoV-2 by FDA under an Emergency Use Authorization (EUA). This EUA will remain in effect (meaning this test can be used) for the duration of the COVID-19 declaration under Section 564(b)(1) of the Act, 21 U.S.C. section 360bbb-3(b)(1), unless the authorization is terminated or revoked.  Performed at [facility], [place] [address] [place], [place] [address]   Culture, blood (Routine X 2) w Reflex to ID Panel     Status: None   Collection Time: 05/02/22 12:25 AM   Specimen: BLOOD  Result Value Ref Range Status   Specimen Description BLOOD RIGHT ANTECUBITAL  Final   Special Requests   Final    BOTTLES DRAWN AEROBIC AND ANAEROBIC Blood Culture adequate volume   Culture   Final    NO GROWTH 5 DAYS Performed at [facility], [place] [address] [place], [place] [address]    Report Status 05/07/2022 FINAL  Final    RADIOLOGY STUDIES/RESULTS: No results found.   LOS: 8 days   Oren Binet, MD  [facility]    To contact the attending provider between 7A-7P or the covering provider during after hours 7P-7A, please log into the web site [email or web address] and access using universal [place] password for that web site. If you do not have the password, please call the hospital operator.  05/10/2022, 9:34 AM

## 2022-05-11 DIAGNOSIS — S31000A Unspecified open wound of lower back and pelvis without penetration into retroperitoneum, initial encounter: Secondary | ICD-10-CM | POA: Diagnosis not present

## 2022-05-11 DIAGNOSIS — M869 Osteomyelitis, unspecified: Secondary | ICD-10-CM | POA: Diagnosis not present

## 2022-05-11 DIAGNOSIS — U071 COVID-19: Secondary | ICD-10-CM | POA: Diagnosis not present

## 2022-05-11 DIAGNOSIS — R4182 Altered mental status, unspecified: Secondary | ICD-10-CM | POA: Diagnosis not present

## 2022-05-11 LAB — GLUCOSE, CAPILLARY
Glucose-Capillary: 238 mg/dL — ABNORMAL HIGH (ref 70–99)
Glucose-Capillary: 260 mg/dL — ABNORMAL HIGH (ref 70–99)
Glucose-Capillary: 208 mg/dL — ABNORMAL HIGH (ref 70–99)
Glucose-Capillary: 166 mg/dL — ABNORMAL HIGH (ref 70–99)
Glucose-Capillary: 112 mg/dL — ABNORMAL HIGH (ref 70–99)

## 2022-05-11 MED ORDER — DEXAMETHASONE 6 MG PO TABS
3.0000 mg | ORAL_TABLET | Freq: Three times a day (TID) | ORAL | Status: DC
  Administered 2022-05-11 – 2022-05-13 (×6): 3 mg via ORAL
  Filled 2022-05-11 (×3): qty 0.5
  Filled 2022-05-11: qty 2
  Filled 2022-05-11 (×3): qty 0.5

## 2022-05-11 NOTE — Progress Notes (Signed)
PROGRESS NOTE        PATIENT DETAILS Name: Cassandra Dodson Age: 82 y.o. Sex: female Date of Birth: 10-10-1940 Admit Date: 05/01/2022 Admitting Physician Kayleen Memos, DO LZJ:QBHAL, Jenny Reichmann, MD  Brief Summary: Patient is a 82 y.o.  female with history of chronic hypoxic respiratory failure on 2 L of oxygen, CVA with residual left-sided deficits/dysphagia, meningioma, DM-2, history of PE on Eliquis-who presented with acute metabolic encephalopathy-further workup revealed COVID-19 infection, enlargement of known meningioma with vasogenic edema.  Below for further details.  Significant events: 1/10>> admit to [facility]-acute metabolic encephalopathy-COVID 19 infection.  Significant studies: 1/10>> CXR: No PNA 1/11>> CT head: Minimally enlarged 5.4 meningioma-significant mass effect with vasogenic edema. 1/11>> CT angio chest: No PE.  Mild to moderate biapical/bibasilar scarring 1/11>> CT abdomen/pelvis: Stool ball in the rectum, bladder distention. 1/12>> ABI: Left side-moderate arterial disease. 1/14>> echo: EF 35-40%  Significant microbiology data: 1/10>> COVID PCR: Positive 1/10>> influenza/RSV: Negative 1/11>> blood culture: No growth  Procedures: None  Consults: Neurosurgery Infectious disease Palliative care Cardiology Ortho  Subjective: No major issues overnight-lying comfortably in bed.  Hard of hearing but when spoken loudly will follow all commands.  Objective: Vitals: Blood pressure 129/83, pulse 70, temperature 98.4 F (36.9 C), temperature source Oral, resp. rate 18, weight 76.3 kg, SpO2 100 %.   Exam: Gen Exam:not in any distress HEENT:atraumatic, normocephalic Chest: B/L clear to auscultation anteriorly CVS:S1S2 regular Abdomen:soft non tender, non distended Extremities:no edema Neurology: Non focal-but with generalized weakness. Skin: no rash  Pertinent Labs/Radiology:    Latest Ref Rng & Units 05/08/2022    8:08 AM 05/07/2022     5:34 AM 05/06/2022    4:50 AM  CBC  WBC 4.0 - 10.5 K/uL 6.4  7.1  8.7   Hemoglobin 12.0 - 15.0 g/dL 10.4  9.6  9.6   Hematocrit 36.0 - 46.0 % 32.5  31.2  30.6   Platelets 150 - 400 K/uL 248  236  194     Lab Results  Component Value Date   NA 134 (L) 05/08/2022   K 3.7 05/08/2022   CL 97 (L) 05/08/2022   CO2 26 05/08/2022      Assessment/Plan: Acute metabolic encephalopathy due to COVID-19 infection Encephalopathy improved-she is much more awake/alert  COVID 19 infection Sepsis Sepsis physiology has resolved No significant parenchymal involvement on CT chest Already on Decadron for meningioma with mass effect  Acute on chronic hypoxic respiratory failure Suspect may have had some bronchitis due to COVID-19 infection causing worsening of hypoxemia Hypoxia is most Paresh-she is now on room air.  A-fib RVR Maintaining sinus rhythm Continue beta-blocker/Eliquis  New diagnosis of HFrEF Appears relatively compensated-has chronic lower extremity edema Continue metoprolol Due to advanced age/frailty-do not think she is a candidate for further interventions and initiation of GDMT  Left heel pressure ulcer with osteomyelitis Evaluated by orthopedics/ID-not a surgical candidate-since no overt infection-not felt to require antibiotics.  Meningioma with 18 mm left to right midline shift/extensive vasogenic edema Continue Decadron-taper down further. Agree with neurosurgery-not a candidate for aggressive care including craniotomy/excision given high risk  History of pulmonary embolism Continue Eliquis  DM-2 (A1c 5.0 on 03/02/2022) with uncontrolled hyperglycemia due to steroid use CBGs on the higher side-not a candidate for aggressive care Steroids being tapered down-Decadron dosage decreased further today Continue Semglee 14 units daily/SSI Reassess  on 1/21 for further adjustment.   Recent Labs    05/10/22 2148 05/11/22 0836 05/11/22 0840  GLUCAP 215* 260* 238*      HLD Continue statin/fenofibrate  History of prior CVA with left-sided deficits/dysphagia On Eliquis/statin  Constipation MiraLAX/senna Will use Dulcolax/Fleet enema for severe constipation.  Goals of care DNR in place Palliative care following This MD spoke with son on 1/18-plan is for home hospice-family needs several days to arrange space/clean apartment.  Likely discharge early next week with home hospice.  Nutrition Status: Nutrition Problem: Moderate Malnutrition Etiology: chronic illness Signs/Symptoms: moderate muscle depletion, mild fat depletion Interventions: Ensure Enlive (each supplement provides 350kcal and 20 grams of protein), MVI, Magic cup    Pressure Ulcer: Pressure Injury 03/01/22 Heel Left Deep Tissue Pressure Injury - Purple or maroon localized area of discolored intact skin or blood-filled blister due to damage of underlying soft tissue from pressure and/or shear. (Active)  03/01/22 2030  Location: Heel  Location Orientation: Left  Staging: Deep Tissue Pressure Injury - Purple or maroon localized area of discolored intact skin or blood-filled blister due to damage of underlying soft tissue from pressure and/or shear.  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 (Active)  05/02/22 1601  Location: Coccyx  Location Orientation: Left  Staging: Stage 4 - Full thickness tissue loss with exposed bone, tendon or muscle.  Wound Description (Comments):   Present on Admission: Yes  Dressing Type Foam - Lift dressing to assess site every shift 05/10/22 2020    BMI: Estimated body mass index is 29.8 kg/m as calculated from the following:   Height as of 04/10/22: '5\' 3"'$  (1.6 m).   Weight as of this encounter: 76.3 kg.   Code status:   Code Status: DNR    DVT Prophylaxis: apixaban (ELIQUIS) tablet 5 mg     Family Communication: None at bedside-palliative care engaging with family.   Disposition Plan: Status is: Inpatient Remains inpatient appropriate because: Severity of illness   Planned Discharge Destination: Home with hospice   Diet: Diet Order             DIET DYS 3 Room service appropriate? No; Fluid consistency: Thin  Diet effective now                     Antimicrobial agents: Anti-infectives (From admission, onward)    Start     Dose/Rate Route Frequency Ordered Stop   05/03/22 1000  remdesivir 100 mg in sodium chloride 0.9 % 100 mL IVPB       See Hyperspace for full Linked Orders Report.   100 mg 200 mL/hr over 30 Minutes Intravenous Daily 05/02/22 0510 05/04/22 1427   05/02/22 2200  vancomycin (VANCOREADY) IVPB 1250 mg/250 mL  Status:  Discontinued        1,250 mg 166.7 mL/hr over 90 Minutes Intravenous Every 24 hours 05/02/22 0542 05/05/22 1433   05/02/22 1000  ceFEPIme (MAXIPIME) 2 g in sodium chloride 0.9 % 100 mL IVPB  Status:  Discontinued        2 g 200 mL/hr over 30 Minutes Intravenous Every 12 hours 05/02/22 0542 05/05/22 1433   05/02/22 1000  metroNIDAZOLE (FLAGYL) IVPB 500 mg  Status:  Discontinued        500  mg 100 mL/hr over 60 Minutes Intravenous Every 12 hours 05/02/22 0542 05/05/22 1433   05/02/22 0700  remdesivir 200 mg in sodium chloride 0.9% 250 mL IVPB       See Hyperspace for full Linked Orders Report.   200 mg 580 mL/hr over 30 Minutes Intravenous Once 05/02/22 0510 05/02/22 0749   05/01/22 2345  ceFEPIme (MAXIPIME) 2 g in sodium chloride 0.9 % 100 mL IVPB        2 g 200 mL/hr over 30 Minutes Intravenous  Once 05/01/22 2339 05/02/22 0101   05/01/22 2345  vancomycin (VANCOREADY) IVPB 1250 mg/250 mL        1,250 mg 166.7 mL/hr over 90 Minutes Intravenous  Once 05/01/22 2339 05/02/22 0422   05/01/22 2330  metroNIDAZOLE (FLAGYL) IVPB 500 mg        500 mg 100 mL/hr over 60 Minutes  Intravenous  Once 05/01/22 2317 05/02/22 0219        MEDICATIONS: Scheduled Meds:  apixaban  5 mg Oral BID   vitamin C  500 mg Oral Daily   dexamethasone  3 mg Oral Q8H   feeding supplement  237 mL Oral TID BM   fenofibrate  160 mg Oral QPC breakfast   folic acid  1 mg Oral Daily   insulin aspart  0-15 Units Subcutaneous TID WC   insulin aspart  0-5 Units Subcutaneous QHS   insulin glargine-yfgn  14 Units Subcutaneous Daily   leptospermum manuka honey  1 Application Topical Daily   liver oil-zinc oxide  1 Application Topical BID   methylphenidate  5 mg Oral BID WC   metoprolol tartrate  50 mg Oral BID   multivitamin with minerals  1 tablet Oral Daily   pantoprazole  40 mg Oral Daily   polyethylene glycol  17 g Oral Daily   rosuvastatin  20 mg Oral QHS   senna-docusate  2 tablet Oral QHS   zinc sulfate  220 mg Oral Daily   Continuous Infusions:   PRN Meds:.bisacodyl, guaiFENesin-dextromethorphan, Ipratropium-Albuterol, labetalol, sodium phosphate   I have personally reviewed following labs and imaging studies  LABORATORY DATA: CBC: Recent Labs  Lab 05/05/22 0446 05/06/22 0450 05/07/22 0534 05/08/22 0808  WBC 8.4 8.7 7.1 6.4  HGB 10.1* 9.6* 9.6* 10.4*  HCT 31.5* 30.6* 31.2* 32.5*  MCV 84.5 85.5 85.5 84.2  PLT 288 194 236 248     Basic Metabolic Panel: Recent Labs  Lab 05/05/22 0446 05/06/22 0450 05/07/22 0534 05/08/22 0808  NA 138 134* 136 134*  K 3.3* 3.6 4.0 3.7  CL 104 102 99 97*  CO2 '27 26 28 26  '$ GLUCOSE 206* 228* 212* 233*  BUN '17 15 15 17  '$ CREATININE 0.68 0.59 0.54 0.53  CALCIUM 8.9 8.3* 8.8* 8.8*  MG 1.6* 2.0  --   --      GFR: Estimated Creatinine Clearance: 53.1 mL/min (by C-G formula based on SCr of 0.53 mg/dL).  Liver Function Tests: Recent Labs  Lab 05/05/22 0446 05/06/22 0450 05/07/22 0534 05/08/22 0808  AST '19 17 16 15  '$ ALT '24 20 16 16  '$ ALKPHOS 62 63 60 61  BILITOT 0.5 0.4 0.5 0.5  PROT 5.4* 5.3* 5.1* 5.4*  ALBUMIN 2.1*  1.9* 1.8* 1.9*    No results for input(s): "LIPASE", "AMYLASE" in the last 168 hours. No results for input(s): "AMMONIA" in the last 168 hours.  Coagulation Profile: No results for input(s): "INR", "PROTIME" in the last 168 hours.  Cardiac Enzymes: No  results for input(s): "CKTOTAL", "CKMB", "CKMBINDEX", "TROPONINI" in the last 168 hours.  BNP (last 3 results) No results for input(s): "PROBNP" in the last 8760 hours.  Lipid Profile: No results for input(s): "CHOL", "HDL", "LDLCALC", "TRIG", "CHOLHDL", "LDLDIRECT" in the last 72 hours.  Thyroid Function Tests: No results for input(s): "TSH", "T4TOTAL", "FREET4", "T3FREE", "THYROIDAB" in the last 72 hours.  Anemia Panel: No results for input(s): "VITAMINB12", "FOLATE", "FERRITIN", "TIBC", "IRON", "RETICCTPCT" in the last 72 hours.   Urine analysis:    Component Value Date/Time   COLORURINE YELLOW 05/02/2022 0312   APPEARANCEUR CLOUDY (A) 05/02/2022 0312   LABSPEC 1.015 05/02/2022 0312   PHURINE 6.0 05/02/2022 0312   GLUCOSEU NEGATIVE 05/02/2022 0312   HGBUR MODERATE (A) 05/02/2022 0312   BILIRUBINUR NEGATIVE 05/02/2022 0312   KETONESUR NEGATIVE 05/02/2022 0312   PROTEINUR NEGATIVE 05/02/2022 0312   UROBILINOGEN 0.2 02/13/2015 1305   NITRITE NEGATIVE 05/02/2022 0312   LEUKOCYTESUR LARGE (A) 05/02/2022 0312    Sepsis Labs: Lactic Acid, Venous    Component Value Date/Time   LATICACIDVEN 1.3 05/02/2022 0025    MICROBIOLOGY: Recent Results (from the past 240 hour(s))  Resp panel by RT-PCR (RSV, Flu A&B, Covid) Anterior Nasal Swab     Status: Abnormal   Collection Time: 05/01/22  9:07 PM   Specimen: Anterior Nasal Swab  Result Value Ref Range Status   SARS Coronavirus 2 by RT PCR POSITIVE (A) NEGATIVE Final    Comment: (NOTE) SARS-CoV-2 target nucleic acids are DETECTED.  The SARS-CoV-2 RNA is generally detectable in upper respiratory specimens during the acute phase of infection. Positive results are indicative  of the presence of the identified virus, but do not rule out bacterial infection or co-infection with other pathogens not detected by the test. Clinical correlation with patient history and other diagnostic information is necessary to determine patient infection status. The expected result is Negative.  Fact Sheet for Patients: EntrepreneurPulse.com.au  Fact Sheet for Healthcare Providers: IncredibleEmployment.be  This test is not yet approved or cleared by the [place] FDA and  has been authorized for detection and/or diagnosis of SARS-CoV-2 by FDA under an Emergency Use Authorization (EUA).  This EUA will remain in effect (meaning this test can be used) for the duration of  the COVID-19 declaration under Section 564(b)(1) of the A ct, 21 U.S.C. section 360bbb-3(b)(1), unless the authorization is terminated or revoked sooner.     Influenza A by PCR NEGATIVE NEGATIVE Final   Influenza B by PCR NEGATIVE NEGATIVE Final    Comment: (NOTE) The Xpert Xpress SARS-CoV-2/FLU/RSV plus assay is intended as an aid in the diagnosis of influenza from Nasopharyngeal swab specimens and should not be used as a sole basis for treatment. Nasal washings and aspirates are unacceptable for Xpert Xpress SARS-CoV-2/FLU/RSV testing.  Fact Sheet for Patients: EntrepreneurPulse.com.au  Fact Sheet for Healthcare Providers: IncredibleEmployment.be  This test is not yet approved or cleared by the [place] FDA and has been authorized for detection and/or diagnosis of SARS-CoV-2 by FDA under an Emergency Use Authorization (EUA). This EUA will remain in effect (meaning this test can be used) for the duration of the COVID-19 declaration under Section 564(b)(1) of the Act, 21 U.S.C. section 360bbb-3(b)(1), unless the authorization is terminated or revoked.     Resp Syncytial Virus by PCR NEGATIVE NEGATIVE Final    Comment:  (NOTE) Fact Sheet for Patients: EntrepreneurPulse.com.au  Fact Sheet for Healthcare Providers: IncredibleEmployment.be  This test is not yet approved or cleared by the  [place] Architectural technologist and has been authorized for detection and/or diagnosis of SARS-CoV-2 by FDA under an Print production planner (EUA). This EUA will remain in effect (meaning this test can be used) for the duration of the COVID-19 declaration under Section 564(b)(1) of the Act, 21 U.S.C. section 360bbb-3(b)(1), unless the authorization is terminated or revoked.  Performed at [facility], [place] [address], [place], [place] [address]   Culture, blood (Routine X 2) w Reflex to ID Panel     Status: None   Collection Time: 05/02/22 12:25 AM   Specimen: BLOOD  Result Value Ref Range Status   Specimen Description BLOOD RIGHT ANTECUBITAL  Final   Special Requests   Final    BOTTLES DRAWN AEROBIC AND ANAEROBIC Blood Culture adequate volume   Culture   Final    NO GROWTH 5 DAYS Performed at [facility], [place] [address], [place], [place] [address]    Report Status 05/07/2022 FINAL  Final    RADIOLOGY STUDIES/RESULTS: No results found.   LOS: 9 days   Oren Binet, MD  [facility]    To contact the attending provider between 7A-7P or the covering provider during after hours 7P-7A, please log into the web site [email or web address] and access using universal [facility] password for that web site. If you do not have the password, please call the hospital operator.  05/11/2022, 10:22 AM

## 2022-05-12 DIAGNOSIS — M869 Osteomyelitis, unspecified: Secondary | ICD-10-CM | POA: Diagnosis not present

## 2022-05-12 DIAGNOSIS — R4182 Altered mental status, unspecified: Secondary | ICD-10-CM | POA: Diagnosis not present

## 2022-05-12 DIAGNOSIS — S31000A Unspecified open wound of lower back and pelvis without penetration into retroperitoneum, initial encounter: Secondary | ICD-10-CM | POA: Diagnosis not present

## 2022-05-12 DIAGNOSIS — U071 COVID-19: Secondary | ICD-10-CM | POA: Diagnosis not present

## 2022-05-12 LAB — GLUCOSE, CAPILLARY
Glucose-Capillary: 209 mg/dL — ABNORMAL HIGH (ref 70–99)
Glucose-Capillary: 124 mg/dL — ABNORMAL HIGH (ref 70–99)
Glucose-Capillary: 85 mg/dL (ref 70–99)
Glucose-Capillary: 270 mg/dL — ABNORMAL HIGH (ref 70–99)

## 2022-05-12 NOTE — Progress Notes (Signed)
PROGRESS NOTE        PATIENT DETAILS Name: Cassandra Dodson Age: 82 y.o. Sex: female Date of Birth: [birth year] Admit Date: 05/01/2022 Admitting Physician Kayleen Memos, DO YTK:ZSWFU, Jenny Reichmann, MD  Brief Summary: Patient is a 82 y.o.  female with history of chronic hypoxic respiratory failure on 2 L of oxygen, CVA with residual left-sided deficits/dysphagia, meningioma, DM-2, history of PE on Eliquis-who presented with acute metabolic encephalopathy-further workup revealed COVID-19 infection, enlargement of known meningioma with vasogenic edema.  Below for further details.  Significant events: 1/10>> admit to [facility]-acute metabolic encephalopathy-COVID 19 infection.  Significant studies: 1/10>> CXR: No PNA 1/11>> CT head: Minimally enlarged 5.4 meningioma-significant mass effect with vasogenic edema. 1/11>> CT angio chest: No PE.  Mild to moderate biapical/bibasilar scarring 1/11>> CT abdomen/pelvis: Stool ball in the rectum, bladder distention. 1/12>> ABI: Left side-moderate arterial disease. 1/14>> echo: EF 35-40%  Significant microbiology data: 1/10>> COVID PCR: Positive 1/10>> influenza/RSV: Negative 1/11>> blood culture: No growth  Procedures: None  Consults: Neurosurgery Infectious disease Palliative care Cardiology Ortho  Subjective: Sleeping when I walked in-awoke with a loud stimuli-no major issues overnight.  Objective: Vitals: Blood pressure (!) 131/97, pulse 62, temperature 98 F (36.7 C), temperature source Oral, resp. rate 18, weight 76.3 kg, SpO2 100 %.   Exam: Gen Exam:not in any distress HEENT:atraumatic, normocephalic Chest: B/L clear to auscultation anteriorly CVS:S1S2 regular Abdomen:soft non tender, non distended Extremities:no edema Neurology: Non focal-but with generalized weakness. Skin: no rash  Pertinent Labs/Radiology:    Latest Ref Rng & Units 05/08/2022    8:08 AM 05/07/2022    5:34 AM 05/06/2022    4:50 AM  CBC   WBC 4.0 - 10.5 K/uL 6.4  7.1  8.7   Hemoglobin 12.0 - 15.0 g/dL 10.4  9.6  9.6   Hematocrit 36.0 - 46.0 % 32.5  31.2  30.6   Platelets 150 - 400 K/uL 248  236  194     Lab Results  Component Value Date   NA 134 (L) 05/08/2022   K 3.7 05/08/2022   CL 97 (L) 05/08/2022   CO2 26 05/08/2022      Assessment/Plan: Acute metabolic encephalopathy due to COVID-19 infection Encephalopathy improved-she is much more awake/alert  COVID 19 infection Sepsis Sepsis physiology has resolved No significant parenchymal involvement on CT chest Already on Decadron for meningioma with mass effect  Acute on chronic hypoxic respiratory failure Suspect may have had some bronchitis due to COVID-19 infection causing worsening of hypoxemia Hypoxia is most Paresh-she is now on room air.  A-fib RVR Maintaining sinus rhythm Continue beta-blocker/Eliquis  New diagnosis of HFrEF Appears relatively compensated-has chronic lower extremity edema Continue metoprolol Due to advanced age/frailty-do not think she is a candidate for further interventions and initiation of GDMT  Left heel pressure ulcer with osteomyelitis Evaluated by orthopedics/ID-not a surgical candidate-since no overt infection-not felt to require antibiotics.  Meningioma with 18 mm left to right midline shift/extensive vasogenic edema Continue Decadron-taper down further. Agree with neurosurgery-not a candidate for aggressive care including craniotomy/excision given high risk  History of pulmonary embolism Continue Eliquis  DM-2 (A1c 5.0 on 03/02/2022) with uncontrolled hyperglycemia due to steroid use CBGs on the higher side-not a candidate for aggressive care Steroids being tapered down-Decadron dosage decreased further today Continue Semglee 14 units daily/SSI Reassess on 1/21 for further adjustment.  Recent Labs    05/11/22 1733 05/11/22 2142 05/12/22 0818  GLUCAP 166* 112* 209*     HLD Continue  statin/fenofibrate  History of prior CVA with left-sided deficits/dysphagia On Eliquis/statin  Constipation MiraLAX/senna Will use Dulcolax/Fleet enema for severe constipation.  Goals of care DNR in place Palliative care following This MD spoke with son on 1/18-plan is for home hospice-family needs several days to arrange space/clean apartment.  Likely discharge early next week with home hospice.  Nutrition Status: Nutrition Problem: Moderate Malnutrition Etiology: chronic illness Signs/Symptoms: moderate muscle depletion, mild fat depletion Interventions: Ensure Enlive (each supplement provides 350kcal and 20 grams of protein), MVI, Magic cup    Pressure Ulcer: Pressure Injury 03/01/22 Heel Left Deep Tissue Pressure Injury - Purple or maroon localized area of discolored intact skin or blood-filled blister due to damage of underlying soft tissue from pressure and/or shear. (Active)  03/01/22 2030  Location: Heel  Location Orientation: Left  Staging: Deep Tissue Pressure Injury - Purple or maroon localized area of discolored intact skin or blood-filled blister due to damage of underlying soft tissue from pressure and/or shear.  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 (Active)  05/02/22 1601  Location: Coccyx  Location Orientation: Left  Staging: Stage 4 - Full thickness tissue loss with exposed bone, tendon or muscle.  Wound Description (Comments):   Present on Admission: Yes  Dressing Type Foam - Lift dressing to assess site every shift 05/11/22 2030    BMI: Estimated body mass index is 29.8 kg/m as calculated from the following:   Height as of 04/10/22: '5\' 3"'$  (1.6 m).   Weight as of this encounter: 76.3 kg.   Code status:   Code Status: DNR   DVT  Prophylaxis: apixaban (ELIQUIS) tablet 5 mg     Family Communication: None at bedside-palliative care engaging with family.   Disposition Plan: Status is: Inpatient Remains inpatient appropriate because: Severity of illness   Planned Discharge Destination: Home with hospice   Diet: Diet Order             DIET DYS 3 Room service appropriate? No; Fluid consistency: Thin  Diet effective now                     Antimicrobial agents: Anti-infectives (From admission, onward)    Start     Dose/Rate Route Frequency Ordered Stop   05/03/22 1000  remdesivir 100 mg in sodium chloride 0.9 % 100 mL IVPB       See Hyperspace for full Linked Orders Report.   100 mg 200 mL/hr over 30 Minutes Intravenous Daily 05/02/22 0510 05/04/22 1427   05/02/22 2200  vancomycin (VANCOREADY) IVPB 1250 mg/250 mL  Status:  Discontinued        1,250 mg 166.7 mL/hr over 90 Minutes Intravenous Every 24 hours 05/02/22 0542 05/05/22 1433   05/02/22 1000  ceFEPIme (MAXIPIME) 2 g in sodium chloride 0.9 % 100 mL IVPB  Status:  Discontinued        2 g 200 mL/hr over 30 Minutes Intravenous Every 12 hours 05/02/22 0542 05/05/22 1433   05/02/22 1000  metroNIDAZOLE (FLAGYL) IVPB 500 mg  Status:  Discontinued        500 mg 100 mL/hr over 60 Minutes Intravenous  Every 12 hours 05/02/22 0542 05/05/22 1433   05/02/22 0700  remdesivir 200 mg in sodium chloride 0.9% 250 mL IVPB       See Hyperspace for full Linked Orders Report.   200 mg 580 mL/hr over 30 Minutes Intravenous Once 05/02/22 0510 05/02/22 0749   05/01/22 2345  ceFEPIme (MAXIPIME) 2 g in sodium chloride 0.9 % 100 mL IVPB        2 g 200 mL/hr over 30 Minutes Intravenous  Once 05/01/22 2339 05/02/22 0101   05/01/22 2345  vancomycin (VANCOREADY) IVPB 1250 mg/250 mL        1,250 mg 166.7 mL/hr over 90 Minutes Intravenous  Once 05/01/22 2339 05/02/22 0422   05/01/22 2330  metroNIDAZOLE (FLAGYL) IVPB 500 mg        500 mg 100 mL/hr over 60 Minutes  Intravenous  Once 05/01/22 2317 05/02/22 0219        MEDICATIONS: Scheduled Meds:  apixaban  5 mg Oral BID   vitamin C  500 mg Oral Daily   dexamethasone  3 mg Oral Q8H   feeding supplement  237 mL Oral TID BM   fenofibrate  160 mg Oral QPC breakfast   folic acid  1 mg Oral Daily   insulin aspart  0-15 Units Subcutaneous TID WC   insulin aspart  0-5 Units Subcutaneous QHS   insulin glargine-yfgn  14 Units Subcutaneous Daily   leptospermum manuka honey  1 Application Topical Daily   liver oil-zinc oxide  1 Application Topical BID   methylphenidate  5 mg Oral BID WC   metoprolol tartrate  50 mg Oral BID   multivitamin with minerals  1 tablet Oral Daily   pantoprazole  40 mg Oral Daily   polyethylene glycol  17 g Oral Daily   rosuvastatin  20 mg Oral QHS   senna-docusate  2 tablet Oral QHS   zinc sulfate  220 mg Oral Daily   Continuous Infusions:   PRN Meds:.bisacodyl, guaiFENesin-dextromethorphan, Ipratropium-Albuterol, labetalol, sodium phosphate   I have personally reviewed following labs and imaging studies  LABORATORY DATA: CBC: Recent Labs  Lab 05/06/22 0450 05/07/22 0534 05/08/22 0808  WBC 8.7 7.1 6.4  HGB 9.6* 9.6* 10.4*  HCT 30.6* 31.2* 32.5*  MCV 85.5 85.5 84.2  PLT 194 236 248     Basic Metabolic Panel: Recent Labs  Lab 05/06/22 0450 05/07/22 0534 05/08/22 0808  NA 134* 136 134*  K 3.6 4.0 3.7  CL 102 99 97*  CO2 '26 28 26  '$ GLUCOSE 228* 212* 233*  BUN '15 15 17  '$ CREATININE 0.59 0.54 0.53  CALCIUM 8.3* 8.8* 8.8*  MG 2.0  --   --      GFR: Estimated Creatinine Clearance: 53.1 mL/min (by C-G formula based on SCr of 0.53 mg/dL).  Liver Function Tests: Recent Labs  Lab 05/06/22 0450 05/07/22 0534 05/08/22 0808  AST '17 16 15  '$ ALT '20 16 16  '$ ALKPHOS 63 60 61  BILITOT 0.4 0.5 0.5  PROT 5.3* 5.1* 5.4*  ALBUMIN 1.9* 1.8* 1.9*    No results for input(s): "LIPASE", "AMYLASE" in the last 168 hours. No results for input(s): "AMMONIA" in  the last 168 hours.  Coagulation Profile: No results for input(s): "INR", "PROTIME" in the last 168 hours.  Cardiac Enzymes: No results for input(s): "CKTOTAL", "CKMB", "CKMBINDEX", "TROPONINI" in the last 168 hours.  BNP (last 3 results) No results for input(s): "PROBNP" in the last 8760 hours.  Lipid Profile: No results for  input(s): "CHOL", "HDL", "LDLCALC", "TRIG", "CHOLHDL", "LDLDIRECT" in the last 72 hours.  Thyroid Function Tests: No results for input(s): "TSH", "T4TOTAL", "FREET4", "T3FREE", "THYROIDAB" in the last 72 hours.  Anemia Panel: No results for input(s): "VITAMINB12", "FOLATE", "FERRITIN", "TIBC", "IRON", "RETICCTPCT" in the last 72 hours.   Urine analysis:    Component Value Date/Time   COLORURINE YELLOW 05/02/2022 0312   APPEARANCEUR CLOUDY (A) 05/02/2022 0312   LABSPEC 1.015 05/02/2022 0312   PHURINE 6.0 05/02/2022 0312   GLUCOSEU NEGATIVE 05/02/2022 0312   HGBUR MODERATE (A) 05/02/2022 0312   BILIRUBINUR NEGATIVE 05/02/2022 0312   KETONESUR NEGATIVE 05/02/2022 0312   PROTEINUR NEGATIVE 05/02/2022 0312   UROBILINOGEN 0.2 02/13/2015 1305   NITRITE NEGATIVE 05/02/2022 0312   LEUKOCYTESUR LARGE (A) 05/02/2022 0312    Sepsis Labs: Lactic Acid, Venous    Component Value Date/Time   LATICACIDVEN 1.3 05/02/2022 0025    MICROBIOLOGY: No results found for this or any previous visit (from the past 240 hour(s)).   RADIOLOGY STUDIES/RESULTS: No results found.   LOS: 10 days   Oren Binet, MD  [facility]    To contact the attending provider between 7A-7P or the covering provider during after hours 7P-7A, please log into the web site [email or web address] and access using universal [place] password for that web site. If you do not have the password, please call the hospital operator.  05/12/2022, 9:28 AM

## 2022-05-12 NOTE — TOC Progression Note (Signed)
Transition of Care [facility]) - Progression Note    Patient Details  Name: Cassandra Dodson MRN: [id number] Date of Birth: 02-08-1941  [facility]) CM/SW Contact  Carles Collet, RN Phone Number: 05/12/2022, 11:24 AM  Clinical Narrative:     Per chart review [facility] following for home hospice services.  Spoke w son [name] to review DME needs for home at request of attending. [name] states that they have a hospital bed, motorized WC and handicap accessible bathroom at patient's apartment. No new DME needs at this time.  Patient will need non emergency transport home, address on file verified with [facility].    [place], [place] (Son) [phone]     Expected Discharge Plan: Home w Hospice Care Barriers to Discharge: Continued Medical Work up  Expected Discharge Plan and Services   Discharge Planning Services: CM Consult Post Acute Care Choice: Hospice Living arrangements for the past 2 months: Apartment                 DME Arranged: N/A           HH Agency: [facility] [place] Determinants of Health (SDOH) Interventions SDOH Screenings   Food Insecurity: No Food Insecurity (03/02/2022)  Housing: Low Risk  (03/01/2022)  Transportation Needs: No Transportation Needs (03/01/2022)  Utilities: Unknown (03/02/2022)  Tobacco Use: Medium Risk (04/10/2022)    Readmission Risk Interventions     No data to display

## 2022-05-13 ENCOUNTER — Other Ambulatory Visit (HOSPITAL_COMMUNITY): Payer: Self-pay

## 2022-05-13 DIAGNOSIS — R4182 Altered mental status, unspecified: Secondary | ICD-10-CM | POA: Diagnosis not present

## 2022-05-13 DIAGNOSIS — S31000A Unspecified open wound of lower back and pelvis without penetration into retroperitoneum, initial encounter: Secondary | ICD-10-CM | POA: Diagnosis not present

## 2022-05-13 DIAGNOSIS — U071 COVID-19: Secondary | ICD-10-CM | POA: Diagnosis not present

## 2022-05-13 DIAGNOSIS — M869 Osteomyelitis, unspecified: Secondary | ICD-10-CM | POA: Diagnosis not present

## 2022-05-13 LAB — GLUCOSE, CAPILLARY
Glucose-Capillary: 245 mg/dL — ABNORMAL HIGH (ref 70–99)
Glucose-Capillary: 232 mg/dL — ABNORMAL HIGH (ref 70–99)
Glucose-Capillary: 207 mg/dL — ABNORMAL HIGH (ref 70–99)
Glucose-Capillary: 205 mg/dL — ABNORMAL HIGH (ref 70–99)
Glucose-Capillary: 152 mg/dL — ABNORMAL HIGH (ref 70–99)

## 2022-05-13 MED ORDER — METOPROLOL TARTRATE 50 MG PO TABS
50.0000 mg | ORAL_TABLET | Freq: Two times a day (BID) | ORAL | 0 refills | Status: DC
  Filled 2022-05-13: qty 60, 30d supply, fill #0

## 2022-05-13 MED ORDER — DESITIN 13 % EX CREA
1.0000 "application " | TOPICAL_CREAM | Freq: Two times a day (BID) | CUTANEOUS | 0 refills | Status: DC
  Filled 2022-05-13: qty 57, fill #0

## 2022-05-13 MED ORDER — SENNA 8.6 MG PO TABS
12.9000 mg | ORAL_TABLET | Freq: Every evening | ORAL | 0 refills | Status: DC | PRN
  Filled 2022-05-13: qty 30, 20d supply, fill #0

## 2022-05-13 MED ORDER — IPRATROPIUM-ALBUTEROL 20-100 MCG/ACT IN AERS
1.0000 | INHALATION_SPRAY | Freq: Four times a day (QID) | RESPIRATORY_TRACT | 0 refills | Status: DC | PRN
  Filled 2022-05-13: qty 4, 30d supply, fill #0

## 2022-05-13 MED ORDER — BLOOD GLUCOSE METER KIT
PACK | 0 refills | Status: DC
  Filled 2022-05-13: qty 1, 30d supply, fill #0

## 2022-05-13 MED ORDER — FINGERSTIX LANCETS MISC
0 refills | Status: DC
  Filled 2022-05-13: qty 100, 25d supply, fill #0

## 2022-05-13 MED ORDER — INSULIN PEN NEEDLE 32G X 4 MM MISC
1.0000 | Freq: Three times a day (TID) | 1 refills | Status: DC
  Filled 2022-05-13: qty 100, 33d supply, fill #0

## 2022-05-13 MED ORDER — APIXABAN 5 MG PO TABS
5.0000 mg | ORAL_TABLET | Freq: Two times a day (BID) | ORAL | 1 refills | Status: DC
  Filled 2022-05-13: qty 60, 30d supply, fill #0

## 2022-05-13 MED ORDER — MORPHINE SULFATE (CONCENTRATE) 10 MG/0.5ML PO SOLN: 5.0000 mg | Freq: Four times a day (QID) | ORAL | Status: DC | PRN

## 2022-05-13 MED ORDER — ROSUVASTATIN CALCIUM 20 MG PO TABS
20.0000 mg | ORAL_TABLET | Freq: Every day | ORAL | 0 refills | Status: DC
  Filled 2022-05-13: qty 30, 30d supply, fill #0

## 2022-05-13 MED ORDER — DEXAMETHASONE 2 MG PO TABS
2.0000 mg | ORAL_TABLET | Freq: Two times a day (BID) | ORAL | 0 refills | Status: DC
  Filled 2022-05-13: qty 60, 30d supply, fill #0

## 2022-05-13 MED ORDER — PANTOPRAZOLE SODIUM 40 MG PO TBEC
40.0000 mg | DELAYED_RELEASE_TABLET | Freq: Every morning | ORAL | 0 refills | Status: DC
  Filled 2022-05-13: qty 30, 30d supply, fill #0

## 2022-05-13 MED ORDER — INSULIN LISPRO (1 UNIT DIAL) 100 UNIT/ML (KWIKPEN)
2.0000 [IU] | PEN_INJECTOR | Freq: Three times a day (TID) | SUBCUTANEOUS | 0 refills | Status: AC
  Filled 2022-05-13: qty 9, 30d supply, fill #0

## 2022-05-13 MED ORDER — INSULIN GLARGINE 100 UNIT/ML SOLOSTAR PEN
10.0000 [IU] | PEN_INJECTOR | Freq: Every day | SUBCUTANEOUS | 0 refills | Status: DC
  Filled 2022-05-13: qty 3, 30d supply, fill #0

## 2022-05-13 MED ORDER — ASCORBIC ACID 500 MG PO TABS
500.0000 mg | ORAL_TABLET | Freq: Every day | ORAL | 0 refills | Status: DC
  Filled 2022-05-13: qty 30, 30d supply, fill #0

## 2022-05-13 MED ORDER — ZINC SULFATE 220 (50 ZN) MG PO TABS
220.0000 mg | ORAL_TABLET | Freq: Every day | ORAL | 0 refills | Status: DC
  Filled 2022-05-13: qty 30, 30d supply, fill #0

## 2022-05-13 MED ORDER — POLYETHYLENE GLYCOL 3350 17 GM/SCOOP PO POWD
17.0000 g | Freq: Every day | ORAL | 0 refills | Status: DC
  Filled 2022-05-13: qty 238, 14d supply, fill #0

## 2022-05-13 MED ORDER — GLUCOSE BLOOD VI STRP
ORAL_STRIP | 0 refills | Status: DC
  Filled 2022-05-13: qty 100, 25d supply, fill #0

## 2022-05-13 MED ORDER — METHYLPHENIDATE HCL 5 MG PO TABS
5.0000 mg | ORAL_TABLET | Freq: Two times a day (BID) | ORAL | 0 refills | Status: DC
  Filled 2022-05-13: qty 60, 30d supply, fill #0

## 2022-05-13 MED ORDER — DEXAMETHASONE 2 MG PO TABS
2.0000 mg | ORAL_TABLET | Freq: Two times a day (BID) | ORAL | Status: DC
  Administered 2022-05-13 – 2022-05-14 (×2): 2 mg via ORAL
  Filled 2022-05-13 (×3): qty 1

## 2022-05-13 MED ORDER — FENOFIBRATE 160 MG PO TABS
160.0000 mg | ORAL_TABLET | Freq: Every day | ORAL | 1 refills | Status: DC
  Filled 2022-05-13: qty 30, 30d supply, fill #0

## 2022-05-13 MED ORDER — MORPHINE SULFATE (CONCENTRATE) 10 MG/0.5ML PO SOLN
5.0000 mg | Freq: Four times a day (QID) | ORAL | 0 refills | Status: DC | PRN
  Filled 2022-05-13: qty 30, 30d supply, fill #0

## 2022-05-13 NOTE — Progress Notes (Signed)
Manufacturing engineer [facility])   MSW spoke with son/[place] & plan remains for patient to D/C with hospice services. [name] reports that attending/Dr. Sloan Leiter has recommended for O2 to be ordered in the event patient needs it in the home. O2 @ 2L ordered. Plan remains for patient to DC via EMS on 1.23.24 if medically cleared.   If applicable, please send signed and completed DNR with patient/family upon discharge. Please provide prescriptions at discharge as needed to ensure ongoing symptom management and a transport packet.   [facility] information and contact numbers given to family and above information shared with TOC.    Please call with any questions/concerns.    Thank you for the opportunity to participate in this patient's care   Phillis Haggis, MSW [facility] Liaison  [phone]

## 2022-05-13 NOTE — Care Management Important Message (Signed)
Important Message  Patient Details  Name: Cassandra Dodson MRN: [id number] Date of Birth: Aug 23, 1940   Medicare Important Message Given:  Yes Called and spoke wilth the patient son and he advised me to send the IM to the [place] home address.     T Essa Wenk 05/13/2022, 2:17 PM

## 2022-05-13 NOTE — Discharge Summary (Signed)
PATIENT DETAILS Name: Cassandra Dodson Age: 82 y.o. Sex: female Date of Birth: May 26, 1940 MRN: [id number]. Admitting Physician: Kayleen Memos, DO OMB:TDHRC, Jenny Reichmann, MD  Admit Date: 05/01/2022 Discharge date: 05/14/2022  Recommendations for Outpatient Follow-up:  Follow up with PCP in 1-2 weeks Being discharged with home hospice Titrate down/discontinue medications slowly as she declines.  Admitted From:  SNF  Disposition: Hospice care at home   Discharge Condition: poor  CODE STATUS:   Code Status: DNR   Diet recommendation:  Diet Order             Diet Carb Modified           Diet general           DIET DYS 3 Room service appropriate? No; Fluid consistency: Thin  Diet effective now                    Brief Summary: Patient is a 82 y.o.  female with history of chronic hypoxic respiratory failure on 2 L of oxygen, CVA with residual left-sided deficits/dysphagia, meningioma, DM-2, history of PE on Eliquis-who presented with acute metabolic encephalopathy-further workup revealed COVID-19 infection, enlargement of known meningioma with vasogenic edema.  Below for further details.   Significant events: 1/10>> admit to [facility]-acute metabolic encephalopathy-COVID 19 infection.   Significant studies: 1/10>> CXR: No PNA 1/11>> CT head: Minimally enlarged 5.4 meningioma-significant mass effect with vasogenic edema. 1/11>> CT angio chest: No PE.  Mild to moderate biapical/bibasilar scarring 1/11>> CT abdomen/pelvis: Stool ball in the rectum, bladder distention. 1/12>> ABI: Left side-moderate arterial disease. 1/14>> echo: EF 35-40%   Significant microbiology data: 1/10>> COVID PCR: Positive 1/10>> influenza/RSV: Negative 1/11>> blood culture: No growth   Procedures: None   Consults: Neurosurgery Infectious disease Palliative care Cardiology Ortho  Brief Hospital Course: Acute metabolic encephalopathy due to COVID-19 infection Encephalopathy improved-she is  much more awake/alert   COVID 19 infection Sepsis Sepsis physiology has resolved No significant parenchymal involvement on CT chest Already on Decadron for meningioma with mass effect   Acute on chronic hypoxic respiratory failure Suspect may have had some bronchitis due to COVID-19 infection causing worsening of hypoxemia Hypoxia resolved-she is now on room air Home O2 as needed for comfort   A-fib RVR Maintaining sinus rhythm Continue beta-blocker/Eliquis for now-discontinue as she declines with hospice care   New diagnosis of HFrEF Appears relatively compensated-has chronic lower extremity edema Continue metoprolol Due to advanced age/frailty-do not think she is a candidate for further interventions and initiation of GDMT   Left heel pressure ulcer with osteomyelitis Evaluated by orthopedics/ID-not a surgical candidate-since no overt infection-not felt to require antibiotics.   Meningioma with 18 mm left to right midline shift/extensive vasogenic edema Continue Decadron-can continue to taper down Decadron over the next several weeks. Agree with neurosurgery-not a candidate for aggressive care including craniotomy/excision given high risk   History of pulmonary embolism Continue Eliquis Son/family elects to continue Eliquis-as she declines-this can be discontinued.   DM-2 (A1c 5.0 on 03/02/2022) with uncontrolled hyperglycemia due to steroid use CBGs on the higher side-not a candidate for aggressive care Steroids being tapered down-Decadron dosage decreased further today Continue Semglee 10 units/SSI As she declines-this can be decreased/stopped accordingly.  Pressure Ulcer: Pressure Injury 03/01/22 Heel Left Deep Tissue Pressure Injury - Purple or maroon localized area of discolored intact skin or blood-filled blister due to damage of underlying soft tissue from pressure and/or shear. (Active)  03/01/22 2030  Location: Heel  Location Orientation: Left  Staging: Deep  Tissue Pressure Injury - Purple or maroon localized area of discolored intact skin or blood-filled blister due to damage of underlying soft tissue from pressure and/or shear.  Wound Description (Comments):   Present on Admission: Yes  Dressing Type Foam - Lift dressing to assess site every shift 05/13/22 2010     Pressure Injury 05/02/22 Coccyx Left Stage 4 - Full thickness tissue loss with exposed bone, tendon or muscle. (Active)  05/02/22 1601  Location: Coccyx  Location Orientation: Left  Staging: Stage 4 - Full thickness tissue loss with exposed bone, tendon or muscle.  Wound Description (Comments):   Present on Admission: Yes  Dressing Type Foam - Lift dressing to assess site every shift 05/13/22 2010     Nutrition Status: Nutrition Problem: Moderate Malnutrition Etiology: chronic illness Signs/Symptoms: moderate muscle depletion, mild fat depletion Interventions: Ensure Enlive (each supplement provides 350kcal and 20 grams of protein), MVI, Magic cup    Obesity: Estimated body mass index is 29.8 kg/m as calculated from the following:   Height as of 04/10/22: '5\' 3"'$  (1.6 m).   Weight as of this encounter: 76.3 kg.   RN pressure injury documentation: Pressure Injury 03/01/22 Buttocks Mid Stage 2 -  Partial thickness loss of dermis presenting as a shallow open injury with a red, pink wound bed without slough. (Active)  03/01/22 2030  Location: Buttocks  Location Orientation: Mid  Staging: Stage 2 -  Partial thickness loss of dermis presenting as a shallow open injury with a red, pink wound bed without slough.  Wound Description (Comments):   Present on Admission: Yes     Pressure Injury 03/01/22 Heel Left Deep Tissue Pressure Injury - Purple or maroon localized area of discolored intact skin or blood-filled blister due to damage of underlying soft tissue from pressure and/or shear. (Active)  03/01/22 2030  Location: Heel  Location Orientation: Left  Staging: Deep Tissue  Pressure Injury - Purple or maroon localized area of discolored intact skin or blood-filled blister due to damage of underlying soft tissue from pressure and/or shear.  Wound Description (Comments):   Present on Admission: Yes  Dressing Type Foam - Lift dressing to assess site every shift 05/13/22 2010     Pressure Injury 05/02/22 Coccyx Left Stage 4 - Full thickness tissue loss with exposed bone, tendon or muscle. (Active)  05/02/22 1601  Location: Coccyx  Location Orientation: Left  Staging: Stage 4 - Full thickness tissue loss with exposed bone, tendon or muscle.  Wound Description (Comments):   Present on Admission: Yes  Dressing Type Foam - Lift dressing to assess site every shift 05/13/22 2010     Discharge Diagnoses:  Principal Problem:   AMS (altered mental status) Active Problems:   Osteomyelitis of left foot ([place])   Intracranial edema ([place])   COVID-19 virus infection   Ulcer of heel due to diabetes mellitus [facility])   Discharge Instructions:  Activity:  As tolerated with Full fall precautions use walker/cane & assistance as needed   Discharge Instructions     Call MD for:  difficulty breathing, headache or visual disturbances   Complete by: As directed    Call MD for:  persistant nausea and vomiting   Complete by: As directed    Call MD for:  redness, tenderness, or signs of infection (pain, swelling, redness, odor or green/yellow discharge around incision site)   Complete by: As directed    Call MD for:  severe uncontrolled pain  Complete by: As directed    Diet Carb Modified   Complete by: As directed    Diet general   Complete by: As directed    Discharge instructions   Complete by: As directed    Follow with Primary MD  Shon Baton, MD in 1-2 weeks  Please get a complete blood count and chemistry panel checked by your Primary MD at your next visit, and again as instructed by your Primary MD.  Get Medicines reviewed and adjusted: Please take all your  medications with you for your next visit with your Primary MD  Laboratory/radiological data: Please request your Primary MD to go over all hospital tests and procedure/radiological results at the follow up, please ask your Primary MD to get all Hospital records sent to his/her office.  In some cases, they will be blood work, cultures and biopsy results pending at the time of your discharge. Please request that your primary care M.D. follows up on these results.  Also Note the following: If you experience worsening of your admission symptoms, develop shortness of breath, life threatening emergency, suicidal or homicidal thoughts you must seek medical attention immediately by calling 911 or calling your MD immediately  if symptoms less severe.  You must read complete instructions/literature along with all the possible adverse reactions/side effects for all the Medicines you take and that have been prescribed to you. Take any new Medicines after you have completely understood and accpet all the possible adverse reactions/side effects.   Do not drive when taking Pain medications or sleeping medications (Benzodaizepines)  Do not take more than prescribed Pain, Sleep and Anxiety Medications. It is not advisable to combine anxiety,sleep and pain medications without talking with your primary care practitioner  Special Instructions: If you have smoked or chewed Tobacco  in the last 2 yrs please stop smoking, stop any regular Alcohol  and or any Recreational drug use.  Wear Seat belts while driving.  Please note: You were cared for by a hospitalist during your hospital stay. Once you are discharged, your primary care physician will handle any further medical issues. Please note that NO REFILLS for any discharge medications will be authorized once you are discharged, as it is imperative that you return to your primary care physician (or establish a relationship with a primary care physician if you do not  have one) for your post hospital discharge needs so that they can reassess your need for medications and monitor your lab values.   Discharge wound care:   Complete by: As directed    Wet to dry changes as needed   Increase activity slowly   Complete by: As directed       Allergies as of 05/14/2022       Reactions   Anesthetics, Amide Nausea And Vomiting   Pt is intolerant to general anesthesia. Pt will throw up and has thrown up during procedure.    Ether Nausea And Vomiting        Medication List     STOP taking these medications    antiseptic oral rinse Liqd   aspirin EC 81 MG tablet   benzocaine-menthol 6-10 MG lozenge Commonly known as: CHLORAEPTIC   bisacodyl 10 MG suppository Commonly known as: DULCOLAX   cephALEXin 250 MG capsule Commonly known as: KEFLEX   docusate sodium 100 MG capsule Commonly known as: COLACE   feeding supplement (PRO-STAT SUGAR FREE 64) Liqd   folic acid 1 MG tablet Commonly known as: FOLVITE   guaiFENesin  200 MG/10ML Liqd   MILK OF MAGNESIA PO   RA SALINE ENEMA RE   zolpidem 5 MG tablet Commonly known as: AMBIEN       TAKE these medications    acetaminophen 325 MG tablet Commonly known as: TYLENOL Take 650 mg by mouth every 6 (six) hours as needed for moderate pain.   apixaban 5 MG Tabs tablet Commonly known as: ELIQUIS Take 1 tablet (5 mg total) by mouth 2 (two) times daily.   ascorbic acid 500 MG tablet Commonly known as: VITAMIN C Take 1 tablet (500 mg total) by mouth daily.   blood glucose meter kit and supplies Dispense based on patient and insurance preference. Use up to four times daily as directed. (FOR ICD-10 E10.9, E11.9).   Desitin 13 % Crea Generic drug: Zinc Oxide Apply 1 application  topically 2 (two) times daily. To buttocks   dexamethasone 2 MG tablet Commonly known as: DECADRON Take 1 tablet (2 mg total) by mouth every 12 (twelve) hours.   fenofibrate 160 MG tablet Take 1 tablet (160 mg  total) by mouth daily.   Fingerstix Lancets Misc Use to check blood sugar up to 4 times daily.   glucose blood test strip Use to check blood sugar up to 4 times daily   insulin glargine 100 UNIT/ML Solostar Pen Commonly known as: LANTUS Inject 10 Units into the skin daily. What changed: how much to take   insulin lispro 100 UNIT/ML KwikPen Commonly known as: HUMALOG Inject 2-10 Units into the skin 3 (three) times daily before meals. Per sliding scale insulin: 0-150= 0 units, 151-200= 2 units, 201-250= 4 units, 251-300= 6 units, 301-350= 8 units, 351-400= 10 units, 401 or greater, call MD What changed:  how much to take when to take this additional instructions   Insulin Pen Needle 32G X 4 MM Misc Use daily in the morning, at noon, and at bedtime.   Ipratropium-Albuterol 20-100 MCG/ACT Aers respimat Commonly known as: COMBIVENT Inhale 1 puff into the lungs every 6 (six) hours as needed for wheezing.   methylphenidate 5 MG tablet Commonly known as: RITALIN Take 1 tablet (5 mg total) by mouth 2 (two) times daily.   metoprolol tartrate 50 MG tablet Commonly known as: LOPRESSOR Take 1 tablet (50 mg total) by mouth 2 (two) times daily.   morphine CONCENTRATE 10 MG/0.5ML Soln concentrated solution Take 0.25 mLs (5 mg total) by mouth every 6 (six) hours as needed for severe pain, shortness of breath or anxiety.   NUTRITIONAL DRINK MIX PO Take 1 Dose by mouth 3 (three) times daily. Magic Cup   pantoprazole 40 MG tablet Commonly known as: PROTONIX Take 1 tablet (40 mg total) by mouth in the morning.   polyethylene glycol powder 17 GM/SCOOP powder Commonly known as: GLYCOLAX/MIRALAX Take 17 g by mouth daily.   rosuvastatin 20 MG tablet Commonly known as: CRESTOR Take 1 tablet (20 mg total) by mouth at bedtime.   senna 8.6 MG Tabs tablet Commonly known as: SENOKOT Take 1.5 tablets (12.9 mg total) by mouth at bedtime as needed (constipation).   Zinc Sulfate 220 (50 Zn) MG  Tabs Take 1 tablet (220 mg total) by mouth daily.               Discharge Care Instructions  (From admission, onward)           Start     Ordered   05/13/22 0000  Discharge wound care:       Comments:  Wet to dry changes as needed   05/13/22 1138            Follow-up Information     Dixie Dials, MD Follow up in 1 week(s).   Specialty: Cardiology Why: As needed Contact information: [place] [address] [phone]         Shon Baton, MD Follow up.   Specialty: Internal Medicine Why: As needed Contact information: [address] [place] [address] [phone]                Allergies  Allergen Reactions   Anesthetics, Amide Nausea And Vomiting    Pt is intolerant to general anesthesia. Pt will throw up and has thrown up during procedure.    Ether Nausea And Vomiting     Other Procedures/Studies: ECHOCARDIOGRAM LIMITED  Result Date: 05/05/2022    ECHOCARDIOGRAM LIMITED REPORT   Patient Name:   BURNADETTE BASKETT Date of Exam: 05/05/2022 Medical Rec #:  [id number]    Height:       63.0 in Accession #:    [id number]   Weight:       168.2 lb Date of Birth:  02-20-1941    BSA:          1.796 m Patient Age:    22 years     BP:           168/77 mmHg Patient Gender: F            HR:           86 bpm. Exam Location:  Inpatient Procedure: Limited Echo, Cardiac Doppler and Limited Color Doppler Indications:    Atrial Fibrillation I48.91  History:        Patient has prior history of Echocardiogram examinations, most                 recent 01/30/2022. Arrythmias:Atrial Fibrillation,                 Signs/Symptoms:Altered Mental Status; Risk Factors:Former                 Smoker, Hypertension and Diabetes.  Sonographer:    Greer Pickerel Referring Phys: [place]  Sonographer Comments: No subcostal window. Image acquisition challenging due to patient body habitus and Image acquisition challenging due to respiratory motion. IMPRESSIONS   1. Limited study with a limited number of images, patient could not lie flat to complete study  2. Left ventricular ejection fraction, by estimation, is 35 to 40%. Left ventricular ejection fraction by 2D MOD biplane is 34.8 %. The left ventricle has moderately decreased function. The left ventricle demonstrates global hypokinesis.  3. The mitral valve is abnormal. Trivial mitral valve regurgitation. Comparison(s): Changes from prior study are noted. 01/30/2022: LVEF 60-65%. FINDINGS  Left Ventricle: Left ventricular ejection fraction, by estimation, is 35 to 40%. Left ventricular ejection fraction by 2D MOD biplane is 34.8 %. The left ventricle has moderately decreased function. The left ventricle demonstrates global hypokinesis. Pericardium: There is no evidence of pericardial effusion. Mitral Valve: The mitral valve is abnormal. Mild mitral annular calcification. Trivial mitral valve regurgitation. Tricuspid Valve: The tricuspid valve is grossly normal. Tricuspid valve regurgitation is trivial. Additional Comments: Spectral Doppler performed. Color Doppler performed.   LV Volumes (MOD)               Biplane EF (MOD) LV vol d, MOD    74.0 ml  LV Biplane EF:   Left A2C:                                            ventricular LV vol d, MOD    77.3 ml                        ejection A4C:                                            fraction by LV vol s, MOD    51.4 ml                        2D MOD A2C:                                            biplane is LV vol s, MOD    49.0 ml                        34.8 %. A4C: LV SV MOD A2C:   22.5 ml LV SV MOD A4C:   77.3 ml LV SV MOD BP:    26.9 ml MITRAL VALVE               TRICUSPID VALVE MV Area (PHT): 3.65 cm    TR Peak grad:   18.3 mmHg MV Decel Time: 208 msec    TR Vmax:        214.00 cm/s MV E velocity: 70.70 cm/s MV A velocity: 85.90 cm/s MV E/A ratio:  0.82 Lyman Bishop MD Electronically signed by Lyman Bishop MD Signature Date/Time: 05/05/2022/4:58:35 PM     Final    VAS [place] ABI WITH/WO TBI  Result Date: 05/04/2022  LOWER EXTREMITY DOPPLER STUDY Patient Name:  BRIENNE LIGUORI  Date of Exam:   05/03/2022 Medical Rec #: [id number]     Accession #:    [id number] Date of Birth: 19-Apr-1941     Patient Gender: F Patient Age:   57 years Exam Location:  [facility] Procedure:      VAS [place] ABI WITH/WO TBI Referring Phys: MICHAEL JEFFERY --------------------------------------------------------------------------------  Indications: Ulceration. High Risk Factors: Hypertension, hyperlipidemia.  Limitations: Today's exam was limited due to an open wound, involuntary patient              movement, bandages and arm contracture. Comparison Study: No prior studies. Performing Technologist: Carlos Levering RVT  Examination Guidelines: A complete evaluation includes at minimum, Doppler waveform signals and systolic blood pressure reading at the level of bilateral brachial, anterior tibial, and posterior tibial arteries, when vessel segments are accessible. Bilateral testing is considered an integral part of a complete examination. Photoelectric Plethysmograph (PPG) waveforms and toe systolic pressure readings are included as required and additional duplex testing as needed. Limited examinations for reoccurring indications may be performed as noted.  ABI Findings: +---------+------------------+-----+-----------+-------------+ Right    Rt Pressure (mmHg)IndexWaveform   Comment       +---------+------------------+-----+-----------+-------------+ PTA  Ulcer/bandage +---------+------------------+-----+-----------+-------------+ DP       159               1.00 multiphasic              +---------+------------------+-----+-----------+-------------+ Great Toe75                0.47                          +---------+------------------+-----+-----------+-------------+ +---------+------------------+-----+-----------+-------------+ Left      Lt Pressure (mmHg)IndexWaveform   Comment       +---------+------------------+-----+-----------+-------------+ Brachial 159                    Multiphasic              +---------+------------------+-----+-----------+-------------+ PTA                                        Ulcer/bandage +---------+------------------+-----+-----------+-------------+ DP       124               0.78 monophasic               +---------+------------------+-----+-----------+-------------+ Great Toe                                  Absent        +---------+------------------+-----+-----------+-------------+ +-------+-----------+-----------+------------+------------+ ABI/TBIToday's ABIToday's TBIPrevious ABIPrevious TBI +-------+-----------+-----------+------------+------------+ Right  1.00       0.47                                +-------+-----------+-----------+------------+------------+ Left   0.78                                           +-------+-----------+-----------+------------+------------+   Summary: Right: Resting right ankle-brachial index is within normal range. The right toe-brachial index is abnormal. Left: Resting left ankle-brachial index indicates moderate left lower extremity arterial disease. Unable to obtain TBI due to low amplitude/absent waveforms. *See table(s) above for measurements and observations.  Electronically signed by Monica Martinez MD on 05/04/2022 at 9:43:25 AM.    Final    CT ABDOMEN PELVIS W CONTRAST  Result Date: 05/02/2022 CLINICAL DATA:  Study identified as missing a report at 4:02 am on 05/02/2022. 82 year old female with abdomen and flank pain. Increasing shortness of breath. EXAM: CT ABDOMEN AND PELVIS WITH CONTRAST TECHNIQUE: Multidetector CT imaging of the abdomen and pelvis was performed using the standard protocol following bolus administration of intravenous contrast. RADIATION DOSE REDUCTION: This exam was performed according to the  departmental dose-optimization program which includes automated exposure control, adjustment of the mA and/or kV according to patient size and/or use of iterative reconstruction technique. CONTRAST:  13m OMNIPAQUE IOHEXOL 350 MG/ML SOLN COMPARISON:  CTA chest from the same time reported separately. Prior CT Abdomen and Pelvis 01/29/2022. FINDINGS: Lower chest: Chest CTA reported separately. Hepatobiliary: Mild motion artifact. Negative liver and gallbladder. Pancreas: Atrophied, otherwise negative. Spleen: Stable and negative. Adrenals/Urinary Tract: Stable adrenal glands. Kidneys appears stable since October with symmetric renal enhancement, and symmetric contrast excretion on delayed images. No nephrolithiasis or suspicious renal lesion. Decompressed ureters. Moderately  distended urinary bladder today with mild chronic bladder wall thickening. Estimated bladder volume 520 mL. Stomach/Bowel: Stool ball in the rectum, new since October and up to 12 cm in length (series 9, image 113). Upstream redundant but decompressed sigmoid colon throughout the pelvis. Decompressed descending colon with diverticulosis. Mild redundant transverse colon with retained stool. Right colon retained stool and diverticulosis. Diminutive, normal retrocecal appendix series 13, image 58. No large bowel inflammation. No dilated small bowel. Decompressed stomach and duodenum. No free air or free fluid. Vascular/Lymphatic: Aortoiliac calcified atherosclerosis. Normal caliber abdominal aorta. Major arterial structures remain patent. Portal venous system is patent. No lymphadenopathy identified. Reproductive: Surgically absent uterus. Diminutive or absent ovaries. Other: No pelvic free fluid. Presacral stranding has decreased since October but not resolved. Musculoskeletal: Advanced lumbar spine degeneration. Right hip arthroplasty. No acute osseous abnormality identified. IMPRESSION: 1. Stool ball in the rectum up to 12 cm in length, suspicious  for Fecal Impaction. 2. Distended urinary bladder (520 mL) might be urinary retention secondary to #1. 3. No other acute or inflammatory process identified in the abdomen or pelvis. Chest CTA at the same time reported separately. Advanced lumbar spine degeneration. Aortic Atherosclerosis (ICD10-I70.0). Electronically Signed   By: Genevie Ann M.D.   On: 05/02/2022 04:09   CT Angio Chest PE W and/or Wo Contrast  Result Date: 05/02/2022 CLINICAL DATA:  Worsening shortness of breath. EXAM: CT ANGIOGRAPHY CHEST WITH CONTRAST TECHNIQUE: Multidetector CT imaging of the chest was performed using the standard protocol during bolus administration of intravenous contrast. Multiplanar CT image reconstructions and MIPs were obtained to evaluate the vascular anatomy. RADIATION DOSE REDUCTION: This exam was performed according to the departmental dose-optimization program which includes automated exposure control, adjustment of the mA and/or kV according to patient size and/or use of iterative reconstruction technique. CONTRAST:  16m OMNIPAQUE IOHEXOL 350 MG/ML SOLN COMPARISON:  None Available. FINDINGS: Cardiovascular: There is marked severity calcification of the aortic arch, without evidence of aortic aneurysm. Satisfactory opacification of the pulmonary arteries to the segmental level. No evidence of pulmonary embolism. Normal heart size with marked severity coronary artery calcification. No pericardial effusion. Mediastinum/Nodes: No enlarged mediastinal, hilar, or axillary lymph nodes. Thyroid gland, trachea, and esophagus demonstrate no significant findings. Lungs/Pleura: Mild to moderate severity areas of scarring and/or atelectasis are seen within the bilateral apices mild posterior bibasilar atelectasis is also noted. There is no evidence of an acute infiltrate, pleural effusion or pneumothorax. Upper Abdomen: No acute abnormality. Musculoskeletal: Multilevel degenerative changes are seen throughout the thoracic spine.  Review of the MIP images confirms the above findings. IMPRESSION: 1. No evidence of pulmonary embolism or other acute intrathoracic process. 2. Mild to moderate severity biapical and bibasilar scarring and/or atelectasis. 3. Marked severity coronary artery calcification. 4. Aortic atherosclerosis. Aortic Atherosclerosis (ICD10-I70.0). Electronically Signed   By: TVirgina NorfolkM.D.   On: 05/02/2022 00:46   CT Head Wo Contrast  Result Date: 05/02/2022 CLINICAL DATA:  Altered mental status, nontraumatic (Ped 0-17y) EXAM: CT HEAD WITHOUT CONTRAST TECHNIQUE: Contiguous axial images were obtained from the base of the skull through the vertex without intravenous contrast. RADIATION DOSE REDUCTION: This exam was performed according to the departmental dose-optimization program which includes automated exposure control, adjustment of the mA and/or kV according to patient size and/or use of iterative reconstruction technique. 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 Remote bilateral cerebellar infarcts and right occipital cortical infarcts are again noted. Remote infarct within the right pons partially obscured by streak artifact but grossly stable. No acute intracranial hemorrhage or infarct. Ventricular size is stable. Vascular: Vascular stent again noted within the basilar artery. No asymmetric hyperdense vasculature at the skull base. Skull: Normal. Negative for fracture or focal lesion. Sinuses/Orbits: No acute finding. Other: Mastoid air cells and middle ear cavities are clear. IMPRESSION: 1. 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 2. 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 Remote right pontine infarct. 3. No acute intracranial hemorrhage or infarct. Electronically Signed   By: Fidela Salisbury M.D.   On: 05/02/2022 00:31   DG Foot Complete Left  Result Date: 05/01/2022 CLINICAL DATA:  Left foot wound. EXAM: LEFT FOOT - COMPLETE 3+ VIEW COMPARISON:  None Available. FINDINGS: There is no evidence of fracture or dislocation. Lytic/sclerotic appearance of the first metatarsal head with hallux valgus deformity. There is mild cortical erosion and periosteal reaction about the posteroinferior aspect of the calcaneus. There is arthropathy of the multiple tarsometatarsal joints. No pericardial effusion or periosteal reaction. Soft tissue wound about the posterior aspect of the calcaneus. Soft tissue edema about the dorsum of the foot. IMPRESSION: 1. Lytic/sclerotic appearance of the first metatarsal head with hallux valgus deformity, likely a chronic processa. 2. Soft tissue wound about the posterior aspect of the calcaneus. 3. There is mild cortical erosion and periosteal reaction about the posteroinferior aspect of the calcaneus, with adjacent soft tissue wound suspicious for osteomyelitis, further evaluation with MR examination is suggested. Electronically Signed   By: Keane Police D.O.   On: 05/01/2022 22:31   DG Chest Portable 1 View  Result Date: 05/01/2022 CLINICAL DATA:  Shortness of breath EXAM: PORTABLE CHEST 1 VIEW COMPARISON:  03/01/2022 FINDINGS: Stable cardiomediastinal silhouette. Aortic atherosclerotic calcification. No focal consolidation, pleural effusion, or pneumothorax. No acute osseous abnormality. IMPRESSION: No active disease. Electronically Signed   By: Placido Sou M.D.   On: 05/01/2022 21:30     TODAY-DAY OF DISCHARGE:  Subjective:   Cassandra Dodson today has no headache,no chest abdominal pain,no new weakness  tingling or numbness, feels much better wants to go home today.   Objective:   Blood pressure 125/72, pulse 86, temperature 98.1 F (36.7 C), temperature source Oral, resp. rate 17, weight 76.3 kg, SpO2 100 %.  Intake/Output Summary (Last 24 hours) at 05/14/2022 0651 Last data filed at 05/14/2022 0110 Gross per 24 hour  Intake --  Output 1300 ml  Net -1300 ml   Filed Weights   05/04/22 1201  Weight: 76.3 kg    Exam: Awake Alert, Oriented *3, No new F.N deficits, Normal affect [place].AT,PERRAL Supple Neck,No JVD, No cervical lymphadenopathy appriciated.  Symmetrical Chest wall movement, Good air movement bilaterally, CTAB RRR,No Gallops,Rubs or new Murmurs, No Parasternal Heave +ve B.Sounds, Abd Soft, Non tender, No organomegaly appriciated, No rebound -guarding or rigidity. No Cyanosis, Clubbing or edema, No new Rash or bruise   PERTINENT RADIOLOGIC STUDIES: No results found.   PERTINENT LAB RESULTS: CBC: No results for input(s): "WBC", "HGB", "HCT", "PLT" in the last 72 hours. CMET CMP     Component Value Date/Time   NA 134 (L) 05/08/2022 0808   NA 134 (A) 07/09/2016 0000  K 3.7 05/08/2022 0808   CL 97 (L) 05/08/2022 0808   CO2 26 05/08/2022 0808   GLUCOSE 233 (H) 05/08/2022 0808   BUN 17 05/08/2022 0808   BUN 41 (A) 07/09/2016 0000   CREATININE 0.53 05/08/2022 0808   CALCIUM 8.8 (L) 05/08/2022 0808   PROT 5.4 (L) 05/08/2022 0808   ALBUMIN 1.9 (L) 05/08/2022 0808   AST 15 05/08/2022 0808   ALT 16 05/08/2022 0808   ALKPHOS 61 05/08/2022 0808   BILITOT 0.5 05/08/2022 0808   GFRNONAA >60 05/08/2022 0808   GFRAA 44 (L) 09/03/2019 1527    GFR Estimated Creatinine Clearance: 53.1 mL/min (by C-G formula based on SCr of 0.53 mg/dL). No results for input(s): "LIPASE", "AMYLASE" in the last 72 hours. No results for input(s): "CKTOTAL", "CKMB", "CKMBINDEX", "TROPONINI" in the last 72 hours. Invalid input(s): "POCBNP" No results for input(s): "DDIMER" in the last 72  hours. No results for input(s): "HGBA1C" in the last 72 hours. No results for input(s): "CHOL", "HDL", "LDLCALC", "TRIG", "CHOLHDL", "LDLDIRECT" in the last 72 hours. No results for input(s): "TSH", "T4TOTAL", "T3FREE", "THYROIDAB" in the last 72 hours.  Invalid input(s): "FREET3" No results for input(s): "VITAMINB12", "FOLATE", "FERRITIN", "TIBC", "IRON", "RETICCTPCT" in the last 72 hours. Coags: No results for input(s): "INR" in the last 72 hours.  Invalid input(s): "PT" Microbiology: No results found for this or any previous visit (from the past 240 hour(s)).  FURTHER DISCHARGE INSTRUCTIONS:  Get Medicines reviewed and adjusted: Please take all your medications with you for your next visit with your Primary MD  Laboratory/radiological data: Please request your Primary MD to go over all hospital tests and procedure/radiological results at the follow up, please ask your Primary MD to get all Hospital records sent to his/her office.  In some cases, they will be blood work, cultures and biopsy results pending at the time of your discharge. Please request that your primary care M.D. goes through all the records of your hospital data and follows up on these results.  Also Note the following: If you experience worsening of your admission symptoms, develop shortness of breath, life threatening emergency, suicidal or homicidal thoughts you must seek medical attention immediately by calling 911 or calling your MD immediately  if symptoms less severe.  You must read complete instructions/literature along with all the possible adverse reactions/side effects for all the Medicines you take and that have been prescribed to you. Take any new Medicines after you have completely understood and accpet all the possible adverse reactions/side effects.   Do not drive when taking Pain medications or sleeping medications (Benzodaizepines)  Do not take more than prescribed Pain, Sleep and Anxiety  Medications. It is not advisable to combine anxiety,sleep and pain medications without talking with your primary care practitioner  Special Instructions: If you have smoked or chewed Tobacco  in the last 2 yrs please stop smoking, stop any regular Alcohol  and or any Recreational drug use.  Wear Seat belts while driving.  Please note: You were cared for by a hospitalist during your hospital stay. Once you are discharged, your primary care physician will handle any further medical issues. Please note that NO REFILLS for any discharge medications will be authorized once you are discharged, as it is imperative that you return to your primary care physician (or establish a relationship with a primary care physician if you do not have one) for your post hospital discharge needs so that they can reassess your need for medications and monitor  your lab values.  Total Time spent coordinating discharge including counseling, education and face to face time equals greater than 30 minutes.  SignedOren Binet 05/14/2022 6:51 AM

## 2022-05-13 NOTE — Progress Notes (Signed)
PROGRESS NOTE        PATIENT DETAILS Name: Cassandra Dodson Age: 82 y.o. Sex: female Date of Birth: October 30, 1940 Admit Date: 05/01/2022 Admitting Physician Kayleen Memos, DO VCB:SWHQP, Jenny Reichmann, MD  Brief Summary: Patient is a 82 y.o.  female with history of chronic hypoxic respiratory failure on 2 L of oxygen, CVA with residual left-sided deficits/dysphagia, meningioma, DM-2, history of PE on Eliquis-who presented with acute metabolic encephalopathy-further workup revealed COVID-19 infection, enlargement of known meningioma with vasogenic edema.  Below for further details.  Significant events: 1/10>> admit to [facility]-acute metabolic encephalopathy-COVID 19 infection.  Significant studies: 1/10>> CXR: No PNA 1/11>> CT head: Minimally enlarged 5.4 meningioma-significant mass effect with vasogenic edema. 1/11>> CT angio chest: No PE.  Mild to moderate biapical/bibasilar scarring 1/11>> CT abdomen/pelvis: Stool ball in the rectum, bladder distention. 1/12>> ABI: Left side-moderate arterial disease. 1/14>> echo: EF 35-40%  Significant microbiology data: 1/10>> COVID PCR: Positive 1/10>> influenza/RSV: Negative 1/11>> blood culture: No growth  Procedures: None  Consults: Neurosurgery Infectious disease Palliative care Cardiology Ortho  Subjective: Sleeping when I walked in-awoke with a loud stimuli-no major issues overnight.  Objective: Vitals: Blood pressure (!) 147/103, pulse 67, temperature 99 F (37.2 C), temperature source Oral, resp. rate 18, weight 76.3 kg, SpO2 100 %.   Exam: Gen Exam:not in any distress HEENT:atraumatic, normocephalic Chest: B/L clear to auscultation anteriorly CVS:S1S2 regular Abdomen:soft non tender, non distended Extremities:no edema Neurology: Non focal-but with generalized weakness. Skin: no rash  Pertinent Labs/Radiology:    Latest Ref Rng & Units 05/08/2022    8:08 AM 05/07/2022    5:34 AM 05/06/2022    4:50 AM  CBC   WBC 4.0 - 10.5 K/uL 6.4  7.1  8.7   Hemoglobin 12.0 - 15.0 g/dL 10.4  9.6  9.6   Hematocrit 36.0 - 46.0 % 32.5  31.2  30.6   Platelets 150 - 400 K/uL 248  236  194     Lab Results  Component Value Date   NA 134 (L) 05/08/2022   K 3.7 05/08/2022   CL 97 (L) 05/08/2022   CO2 26 05/08/2022      Assessment/Plan: Acute metabolic encephalopathy due to COVID-19 infection Encephalopathy improved-she is much more awake/alert  COVID 19 infection Sepsis Sepsis physiology has resolved No significant parenchymal involvement on CT chest Already on Decadron for meningioma with mass effect  Acute on chronic hypoxic respiratory failure Suspect may have had some bronchitis due to COVID-19 infection causing worsening of hypoxemia Hypoxia resolved-she is now on room air.  A-fib RVR Maintaining sinus rhythm Continue beta-blocker/Eliquis  New diagnosis of HFrEF Appears relatively compensated-has chronic lower extremity edema Continue metoprolol Due to advanced age/frailty-do not think she is a candidate for further interventions and initiation of GDMT  Left heel pressure ulcer with osteomyelitis Evaluated by orthopedics/ID-not a surgical candidate-since no overt infection-not felt to require antibiotics.  Meningioma with 18 mm left to right midline shift/extensive vasogenic edema Continue Decadron-taper down further. Agree with neurosurgery-not a candidate for aggressive care including craniotomy/excision given high risk  History of pulmonary embolism Continue Eliquis  DM-2 (A1c 5.0 on 03/02/2022) with uncontrolled hyperglycemia due to steroid use CBGs on the higher side-not a candidate for aggressive care Steroids being tapered down-Decadron dosage decreased further today Continue Semglee 14 units daily/SSI Reassess on 1/22 for further adjustment.   Recent  Labs    05/12/22 1813 05/12/22 2120 05/13/22 0821  GLUCAP 85 270* 245*     HLD Continue statin/fenofibrate  History  of prior CVA with left-sided deficits/dysphagia On Eliquis/statin  Constipation MiraLAX/senna Will use Dulcolax/Fleet enema for severe constipation.  Goals of care DNR in place Palliative care following This MD spoke with son on 1/18-plan is for home hospice-family needs several days to arrange space/clean apartment.  Likely discharge early next week with home hospice.  Nutrition Status: Nutrition Problem: Moderate Malnutrition Etiology: chronic illness Signs/Symptoms: moderate muscle depletion, mild fat depletion Interventions: Ensure Enlive (each supplement provides 350kcal and 20 grams of protein), MVI, Magic cup    Pressure Ulcer: Pressure Injury 03/01/22 Heel Left Deep Tissue Pressure Injury - Purple or maroon localized area of discolored intact skin or blood-filled blister due to damage of underlying soft tissue from pressure and/or shear. (Active)  03/01/22 2030  Location: Heel  Location Orientation: Left  Staging: Deep Tissue Pressure Injury - Purple or maroon localized area of discolored intact skin or blood-filled blister due to damage of underlying soft tissue from pressure and/or shear.  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 (Active)  05/02/22 1601  Location: Coccyx  Location Orientation: Left  Staging: Stage 4 - Full thickness tissue loss with exposed bone, tendon or muscle.  Wound Description (Comments):   Present on Admission: Yes  Dressing Type Foam - Lift dressing to assess site every shift 05/12/22 2020    BMI: Estimated body mass index is 29.8 kg/m as calculated from the following:   Height as of 04/10/22: '5\' 3"'$  (1.6 m).   Weight as of this encounter: 76.3 kg.   Code status:   Code Status: DNR   DVT Prophylaxis: apixaban (ELIQUIS) tablet 5  mg     Family Communication: Son-[facility] over the phone on 1/22.   Disposition Plan: Status is: Inpatient Remains inpatient appropriate because: Severity of illness   Planned Discharge Destination: Home with hospice   Diet: Diet Order             DIET DYS 3 Room service appropriate? No; Fluid consistency: Thin  Diet effective now                     Antimicrobial agents: Anti-infectives (From admission, onward)    Start     Dose/Rate Route Frequency Ordered Stop   05/03/22 1000  remdesivir 100 mg in sodium chloride 0.9 % 100 mL IVPB       See Hyperspace for full Linked Orders Report.   100 mg 200 mL/hr over 30 Minutes Intravenous Daily 05/02/22 0510 05/04/22 1427   05/02/22 2200  vancomycin (VANCOREADY) IVPB 1250 mg/250 mL  Status:  Discontinued        1,250 mg 166.7 mL/hr over 90 Minutes Intravenous Every 24 hours 05/02/22 0542 05/05/22 1433   05/02/22 1000  ceFEPIme (MAXIPIME) 2 g in sodium chloride 0.9 % 100 mL IVPB  Status:  Discontinued        2 g 200 mL/hr over 30 Minutes Intravenous Every 12 hours 05/02/22 0542 05/05/22 1433   05/02/22 1000  metroNIDAZOLE (FLAGYL) IVPB 500 mg  Status:  Discontinued        500 mg 100 mL/hr over 60 Minutes Intravenous Every 12  hours 05/02/22 0542 05/05/22 1433   05/02/22 0700  remdesivir 200 mg in sodium chloride 0.9% 250 mL IVPB       See Hyperspace for full Linked Orders Report.   200 mg 580 mL/hr over 30 Minutes Intravenous Once 05/02/22 0510 05/02/22 0749   05/01/22 2345  ceFEPIme (MAXIPIME) 2 g in sodium chloride 0.9 % 100 mL IVPB        2 g 200 mL/hr over 30 Minutes Intravenous  Once 05/01/22 2339 05/02/22 0101   05/01/22 2345  vancomycin (VANCOREADY) IVPB 1250 mg/250 mL        1,250 mg 166.7 mL/hr over 90 Minutes Intravenous  Once 05/01/22 2339 05/02/22 0422   05/01/22 2330  metroNIDAZOLE (FLAGYL) IVPB 500 mg        500 mg 100 mL/hr over 60 Minutes Intravenous  Once 05/01/22 2317 05/02/22 0219         MEDICATIONS: Scheduled Meds:  apixaban  5 mg Oral BID   vitamin C  500 mg Oral Daily   dexamethasone  3 mg Oral Q8H   feeding supplement  237 mL Oral TID BM   fenofibrate  160 mg Oral QPC breakfast   folic acid  1 mg Oral Daily   insulin aspart  0-15 Units Subcutaneous TID WC   insulin aspart  0-5 Units Subcutaneous QHS   insulin glargine-yfgn  14 Units Subcutaneous Daily   leptospermum manuka honey  1 Application Topical Daily   liver oil-zinc oxide  1 Application Topical BID   methylphenidate  5 mg Oral BID WC   metoprolol tartrate  50 mg Oral BID   multivitamin with minerals  1 tablet Oral Daily   pantoprazole  40 mg Oral Daily   polyethylene glycol  17 g Oral Daily   rosuvastatin  20 mg Oral QHS   senna-docusate  2 tablet Oral QHS   zinc sulfate  220 mg Oral Daily   Continuous Infusions:   PRN Meds:.bisacodyl, guaiFENesin-dextromethorphan, Ipratropium-Albuterol, labetalol, sodium phosphate   I have personally reviewed following labs and imaging studies  LABORATORY DATA: CBC: Recent Labs  Lab 05/07/22 0534 05/08/22 0808  WBC 7.1 6.4  HGB 9.6* 10.4*  HCT 31.2* 32.5*  MCV 85.5 84.2  PLT 236 248     Basic Metabolic Panel: Recent Labs  Lab 05/07/22 0534 05/08/22 0808  NA 136 134*  K 4.0 3.7  CL 99 97*  CO2 28 26  GLUCOSE 212* 233*  BUN 15 17  CREATININE 0.54 0.53  CALCIUM 8.8* 8.8*     GFR: Estimated Creatinine Clearance: 53.1 mL/min (by C-G formula based on SCr of 0.53 mg/dL).  Liver Function Tests: Recent Labs  Lab 05/07/22 0534 05/08/22 0808  AST 16 15  ALT 16 16  ALKPHOS 60 61  BILITOT 0.5 0.5  PROT 5.1* 5.4*  ALBUMIN 1.8* 1.9*    No results for input(s): "LIPASE", "AMYLASE" in the last 168 hours. No results for input(s): "AMMONIA" in the last 168 hours.  Coagulation Profile: No results for input(s): "INR", "PROTIME" in the last 168 hours.  Cardiac Enzymes: No results for input(s): "CKTOTAL", "CKMB", "CKMBINDEX",  "TROPONINI" in the last 168 hours.  BNP (last 3 results) No results for input(s): "PROBNP" in the last 8760 hours.  Lipid Profile: No results for input(s): "CHOL", "HDL", "LDLCALC", "TRIG", "CHOLHDL", "LDLDIRECT" in the last 72 hours.  Thyroid Function Tests: No results for input(s): "TSH", "T4TOTAL", "FREET4", "T3FREE", "THYROIDAB" in the last 72 hours.  Anemia Panel: No results for  input(s): "VITAMINB12", "FOLATE", "FERRITIN", "TIBC", "IRON", "RETICCTPCT" in the last 72 hours.   Urine analysis:    Component Value Date/Time   COLORURINE YELLOW 05/02/2022 0312   APPEARANCEUR CLOUDY (A) 05/02/2022 0312   LABSPEC 1.015 05/02/2022 0312   PHURINE 6.0 05/02/2022 0312   GLUCOSEU NEGATIVE 05/02/2022 0312   HGBUR MODERATE (A) 05/02/2022 0312   BILIRUBINUR NEGATIVE 05/02/2022 0312   KETONESUR NEGATIVE 05/02/2022 0312   PROTEINUR NEGATIVE 05/02/2022 0312   UROBILINOGEN 0.2 02/13/2015 1305   NITRITE NEGATIVE 05/02/2022 0312   LEUKOCYTESUR LARGE (A) 05/02/2022 0312    Sepsis Labs: Lactic Acid, Venous    Component Value Date/Time   LATICACIDVEN 1.3 05/02/2022 0025    MICROBIOLOGY: No results found for this or any previous visit (from the past 240 hour(s)).   RADIOLOGY STUDIES/RESULTS: No results found.   LOS: 11 days   Oren Binet, MD  [facility]    To contact the attending provider between 7A-7P or the covering provider during after hours 7P-7A, please log into the web site [email or web address] and access using universal [place] password for that web site. If you do not have the password, please call the hospital operator.  05/13/2022, 10:07 AM

## 2022-05-14 DIAGNOSIS — E1165 Type 2 diabetes mellitus with hyperglycemia: Secondary | ICD-10-CM

## 2022-05-14 DIAGNOSIS — U071 COVID-19: Secondary | ICD-10-CM | POA: Diagnosis not present

## 2022-05-14 DIAGNOSIS — R4182 Altered mental status, unspecified: Secondary | ICD-10-CM | POA: Diagnosis not present

## 2022-05-14 DIAGNOSIS — Z794 Long term (current) use of insulin: Secondary | ICD-10-CM | POA: Diagnosis not present

## 2022-05-14 LAB — GLUCOSE, CAPILLARY: Glucose-Capillary: 122 mg/dL — ABNORMAL HIGH (ref 70–99)

## 2022-05-14 NOTE — Progress Notes (Addendum)
[facility])   [facility] confirmed that requested DME has arrived & is prepared for patient DC.  If applicable, please send signed and completed DNR with patient/family upon discharge. Please provide prescriptions at discharge as needed to ensure ongoing symptom management and a transport packet.   [facility] information and contact numbers given to family and above information shared with TOC.    Please call with any questions/concerns.    Thank you for the opportunity to participate in this patient's care   Phillis Haggis, MSW [facility] Liaison  [phone]

## 2022-06-21 DEATH — deceased

## 2022-08-19 ENCOUNTER — Ambulatory Visit: Payer: Medicare Other | Admitting: Neurology

## 2023-01-23 ENCOUNTER — Other Ambulatory Visit (HOSPITAL_COMMUNITY): Payer: Self-pay

## 2023-08-13 NOTE — Telephone Encounter (Signed)
 Error
